# Patient Record
Sex: Male | Born: 1945 | Race: White | Hispanic: No | Marital: Married | Smoking: Former smoker
Health system: Southern US, Community
[De-identification: ages and names within clinical notes are randomized; demographics above are authoritative.]

## PROBLEM LIST (undated history)

## (undated) DIAGNOSIS — N184 Chronic kidney disease, stage 4 (severe): Secondary | ICD-10-CM

## (undated) DIAGNOSIS — I350 Nonrheumatic aortic (valve) stenosis: Secondary | ICD-10-CM

## (undated) DIAGNOSIS — I700 Atherosclerosis of aorta: Secondary | ICD-10-CM

## (undated) DIAGNOSIS — N289 Disorder of kidney and ureter, unspecified: Secondary | ICD-10-CM

## (undated) DIAGNOSIS — E039 Hypothyroidism, unspecified: Secondary | ICD-10-CM

## (undated) DIAGNOSIS — K219 Gastro-esophageal reflux disease without esophagitis: Secondary | ICD-10-CM

## (undated) DIAGNOSIS — N179 Acute kidney failure, unspecified: Secondary | ICD-10-CM

## (undated) DIAGNOSIS — Z87442 Personal history of urinary calculi: Secondary | ICD-10-CM

## (undated) DIAGNOSIS — R011 Cardiac murmur, unspecified: Secondary | ICD-10-CM

## (undated) DIAGNOSIS — I739 Peripheral vascular disease, unspecified: Secondary | ICD-10-CM

## (undated) DIAGNOSIS — N2581 Secondary hyperparathyroidism of renal origin: Secondary | ICD-10-CM

## (undated) DIAGNOSIS — E785 Hyperlipidemia, unspecified: Secondary | ICD-10-CM

## (undated) DIAGNOSIS — I10 Essential (primary) hypertension: Secondary | ICD-10-CM

## (undated) DIAGNOSIS — E119 Type 2 diabetes mellitus without complications: Secondary | ICD-10-CM

## (undated) DIAGNOSIS — I5020 Unspecified systolic (congestive) heart failure: Secondary | ICD-10-CM

## (undated) DIAGNOSIS — N186 End stage renal disease: Secondary | ICD-10-CM

## (undated) DIAGNOSIS — N400 Enlarged prostate without lower urinary tract symptoms: Secondary | ICD-10-CM

## (undated) DIAGNOSIS — M1990 Unspecified osteoarthritis, unspecified site: Secondary | ICD-10-CM

## (undated) DIAGNOSIS — Z789 Other specified health status: Secondary | ICD-10-CM

## (undated) DIAGNOSIS — G4733 Obstructive sleep apnea (adult) (pediatric): Secondary | ICD-10-CM

## (undated) DIAGNOSIS — R0600 Dyspnea, unspecified: Secondary | ICD-10-CM

## (undated) DIAGNOSIS — R519 Headache, unspecified: Secondary | ICD-10-CM

## (undated) DIAGNOSIS — Z9981 Dependence on supplemental oxygen: Secondary | ICD-10-CM

## (undated) DIAGNOSIS — I272 Pulmonary hypertension, unspecified: Secondary | ICD-10-CM

## (undated) DIAGNOSIS — Z9841 Cataract extraction status, right eye: Secondary | ICD-10-CM

## (undated) DIAGNOSIS — G43909 Migraine, unspecified, not intractable, without status migrainosus: Secondary | ICD-10-CM

## (undated) DIAGNOSIS — R0609 Other forms of dyspnea: Secondary | ICD-10-CM

## (undated) DIAGNOSIS — Z0282 Encounter for adoption services: Secondary | ICD-10-CM

## (undated) DIAGNOSIS — K5790 Diverticulosis of intestine, part unspecified, without perforation or abscess without bleeding: Secondary | ICD-10-CM

## (undated) DIAGNOSIS — E875 Hyperkalemia: Secondary | ICD-10-CM

## (undated) DIAGNOSIS — I5189 Other ill-defined heart diseases: Secondary | ICD-10-CM

## (undated) DIAGNOSIS — Z8719 Personal history of other diseases of the digestive system: Secondary | ICD-10-CM

## (undated) DIAGNOSIS — Z9842 Cataract extraction status, left eye: Secondary | ICD-10-CM

## (undated) DIAGNOSIS — G4730 Sleep apnea, unspecified: Secondary | ICD-10-CM

## (undated) DIAGNOSIS — J9611 Chronic respiratory failure with hypoxia: Secondary | ICD-10-CM

## (undated) DIAGNOSIS — D509 Iron deficiency anemia, unspecified: Secondary | ICD-10-CM

## (undated) DIAGNOSIS — M109 Gout, unspecified: Secondary | ICD-10-CM

## (undated) DIAGNOSIS — I2510 Atherosclerotic heart disease of native coronary artery without angina pectoris: Secondary | ICD-10-CM

## (undated) DIAGNOSIS — I509 Heart failure, unspecified: Secondary | ICD-10-CM

## (undated) DIAGNOSIS — J449 Chronic obstructive pulmonary disease, unspecified: Secondary | ICD-10-CM

## (undated) DIAGNOSIS — I517 Cardiomegaly: Secondary | ICD-10-CM

## (undated) DIAGNOSIS — N200 Calculus of kidney: Secondary | ICD-10-CM

## (undated) HISTORY — DX: Acute kidney failure, unspecified: N17.9

## (undated) HISTORY — PX: SUPRAVALVULAR AORTIC STENOSIS REPAIR: SHX2475

## (undated) HISTORY — PX: COLONOSCOPY: SHX174

## (undated) HISTORY — PX: HERNIA REPAIR: SHX51

## (undated) HISTORY — PX: LITHOTRIPSY: SUR834

---

## 2004-07-13 ENCOUNTER — Emergency Department: Payer: Self-pay | Admitting: Surgery

## 2005-03-20 ENCOUNTER — Ambulatory Visit: Payer: Self-pay

## 2007-01-21 ENCOUNTER — Ambulatory Visit: Payer: Self-pay | Admitting: Internal Medicine

## 2009-01-12 ENCOUNTER — Ambulatory Visit: Payer: Self-pay | Admitting: Internal Medicine

## 2009-01-12 ENCOUNTER — Ambulatory Visit: Payer: Self-pay | Admitting: Urology

## 2009-01-14 ENCOUNTER — Ambulatory Visit: Payer: Self-pay | Admitting: Urology

## 2009-03-11 ENCOUNTER — Ambulatory Visit: Payer: Self-pay | Admitting: Urology

## 2009-05-05 ENCOUNTER — Ambulatory Visit: Payer: Self-pay | Admitting: Urology

## 2009-05-06 ENCOUNTER — Ambulatory Visit: Payer: Self-pay | Admitting: Urology

## 2010-01-04 DIAGNOSIS — C4491 Basal cell carcinoma of skin, unspecified: Secondary | ICD-10-CM

## 2010-01-04 HISTORY — DX: Basal cell carcinoma of skin, unspecified: C44.91

## 2011-05-31 ENCOUNTER — Ambulatory Visit: Payer: Self-pay | Admitting: Urology

## 2011-09-12 ENCOUNTER — Ambulatory Visit: Payer: Self-pay | Admitting: Oncology

## 2011-09-15 ENCOUNTER — Ambulatory Visit: Payer: Self-pay | Admitting: Oncology

## 2011-09-15 LAB — IRON AND TIBC
Iron Saturation: 47 %
Iron: 170 ug/dL (ref 65–175)

## 2011-09-15 LAB — CBC CANCER CENTER
Basophil #: 0 x10 3/mm (ref 0.0–0.1)
Eosinophil #: 0 x10 3/mm (ref 0.0–0.7)
Eosinophil %: 0.5 %
HCT: 54.2 % — ABNORMAL HIGH (ref 40.0–52.0)
Lymphocyte #: 1.9 x10 3/mm (ref 1.0–3.6)
Lymphocyte %: 30.6 %
MCHC: 33.3 g/dL (ref 32.0–36.0)
MCV: 98 fL (ref 80–100)
Neutrophil %: 55.3 %
Platelet: 211 x10 3/mm (ref 150–440)
RBC: 5.55 10*6/uL (ref 4.40–5.90)
RDW: 13.8 % (ref 11.5–14.5)
WBC: 6.3 x10 3/mm (ref 3.8–10.6)

## 2011-09-15 LAB — FERRITIN: Ferritin (ARMC): 222 ng/mL (ref 8–388)

## 2011-09-19 LAB — CANCER CENTER HEMATOCRIT: HCT: 52.7 % — ABNORMAL HIGH (ref 40.0–52.0)

## 2011-09-23 ENCOUNTER — Ambulatory Visit: Payer: Self-pay | Admitting: Oncology

## 2011-09-26 LAB — BASIC METABOLIC PANEL
Anion Gap: 8 (ref 7–16)
BUN: 26 mg/dL — ABNORMAL HIGH (ref 7–18)
Calcium, Total: 9.3 mg/dL (ref 8.5–10.1)
Creatinine: 1.58 mg/dL — ABNORMAL HIGH (ref 0.60–1.30)
EGFR (African American): 52 — ABNORMAL LOW
EGFR (Non-African Amer.): 45 — ABNORMAL LOW
Potassium: 4.3 mmol/L (ref 3.5–5.1)
Sodium: 139 mmol/L (ref 136–145)

## 2011-09-26 LAB — CBC CANCER CENTER
Basophil #: 0 x10 3/mm (ref 0.0–0.1)
Basophil %: 0.5 %
Eosinophil %: 0.5 %
HCT: 47.5 % (ref 40.0–52.0)
HGB: 16.1 g/dL (ref 13.0–18.0)
Lymphocyte #: 1.9 x10 3/mm (ref 1.0–3.6)
Lymphocyte %: 30.5 %
MCH: 32.9 pg (ref 26.0–34.0)
MCHC: 34 g/dL (ref 32.0–36.0)
MCV: 97 fL (ref 80–100)
Monocyte #: 0.5 x10 3/mm (ref 0.2–1.0)
Neutrophil #: 3.8 x10 3/mm (ref 1.4–6.5)
Neutrophil %: 60 %
RBC: 4.91 10*6/uL (ref 4.40–5.90)
RDW: 13.7 % (ref 11.5–14.5)

## 2011-10-23 ENCOUNTER — Ambulatory Visit: Payer: Self-pay | Admitting: Oncology

## 2011-11-07 LAB — CANCER CENTER HEMATOCRIT: HCT: 42.1 % (ref 40.0–52.0)

## 2011-11-23 ENCOUNTER — Ambulatory Visit: Payer: Self-pay | Admitting: Internal Medicine

## 2011-11-23 ENCOUNTER — Ambulatory Visit: Payer: Self-pay | Admitting: Oncology

## 2011-12-05 LAB — CBC CANCER CENTER
Basophil %: 0.2 %
Eosinophil #: 0 x10 3/mm (ref 0.0–0.7)
Eosinophil %: 0.7 %
HCT: 40.6 % (ref 40.0–52.0)
HGB: 13.9 g/dL (ref 13.0–18.0)
MCH: 33.1 pg (ref 26.0–34.0)
MCHC: 34.3 g/dL (ref 32.0–36.0)
MCV: 96 fL (ref 80–100)
Monocyte %: 8.7 %
Neutrophil %: 56.3 %

## 2011-12-24 ENCOUNTER — Ambulatory Visit: Payer: Self-pay | Admitting: Oncology

## 2011-12-24 ENCOUNTER — Ambulatory Visit: Payer: Self-pay | Admitting: Internal Medicine

## 2012-02-02 ENCOUNTER — Emergency Department: Payer: Self-pay | Admitting: Emergency Medicine

## 2012-02-03 LAB — SYNOVIAL CELL COUNT + DIFF, W/ CRYSTALS
Eosinophil: 0 %
Lymphocytes: 8 %
Neutrophils: 85 %
Nucleated Cell Count: 2023 /mm3
Other Mononuclear Cells: 7 %

## 2012-02-03 LAB — BASIC METABOLIC PANEL
Anion Gap: 10 (ref 7–16)
Calcium, Total: 8.9 mg/dL (ref 8.5–10.1)
Chloride: 106 mmol/L (ref 98–107)
Co2: 24 mmol/L (ref 21–32)
EGFR (Non-African Amer.): >60
Glucose: 126 mg/dL — ABNORMAL HIGH (ref 65–99)
Osmolality: 287 (ref 275–301)
Potassium: 4.1 mmol/L (ref 3.5–5.1)
Sodium: 140 mmol/L (ref 136–145)

## 2012-02-03 LAB — CBC WITH DIFFERENTIAL/PLATELET
Basophil #: 0.1 10*3/uL (ref 0.0–0.1)
Basophil %: 0.6 %
Eosinophil #: 0.1 10*3/uL (ref 0.0–0.7)
Eosinophil %: 1.2 %
HGB: 13.3 g/dL (ref 13.0–18.0)
Lymphocyte #: 2.2 10*3/uL (ref 1.0–3.6)
MCH: 34.6 pg — ABNORMAL HIGH (ref 26.0–34.0)
MCHC: 35.5 g/dL (ref 32.0–36.0)
MCV: 98 fL (ref 80–100)
Monocyte #: 0.8 x10 3/mm (ref 0.2–1.0)
Platelet: 251 10*3/uL (ref 150–440)
RBC: 3.85 10*6/uL — ABNORMAL LOW (ref 4.40–5.90)
WBC: 8.1 10*3/uL (ref 3.8–10.6)

## 2012-02-03 LAB — SEDIMENTATION RATE: Erythrocyte Sed Rate: 49 mm/hr — ABNORMAL HIGH (ref 0–20)

## 2012-02-07 LAB — BODY FLUID CULTURE

## 2012-02-08 LAB — CULTURE, BLOOD (SINGLE)

## 2012-02-27 ENCOUNTER — Ambulatory Visit: Payer: Self-pay | Admitting: Internal Medicine

## 2012-03-24 ENCOUNTER — Ambulatory Visit: Payer: Self-pay | Admitting: Internal Medicine

## 2012-04-08 DIAGNOSIS — Z85828 Personal history of other malignant neoplasm of skin: Secondary | ICD-10-CM

## 2012-04-08 HISTORY — DX: Personal history of other malignant neoplasm of skin: Z85.828

## 2012-07-23 ENCOUNTER — Ambulatory Visit: Payer: Self-pay | Admitting: Internal Medicine

## 2012-07-25 ENCOUNTER — Inpatient Hospital Stay: Payer: Self-pay | Admitting: Internal Medicine

## 2012-07-25 LAB — CBC
HGB: 12.9 g/dL — ABNORMAL LOW (ref 13.0–18.0)
MCH: 33.6 pg (ref 26.0–34.0)
MCHC: 34.6 g/dL (ref 32.0–36.0)
Platelet: 214 10*3/uL (ref 150–440)

## 2012-07-25 LAB — COMPREHENSIVE METABOLIC PANEL
Anion Gap: 9 (ref 7–16)
Bilirubin,Total: 0.5 mg/dL (ref 0.2–1.0)
Calcium, Total: 9.2 mg/dL (ref 8.5–10.1)
Chloride: 105 mmol/L (ref 98–107)
Co2: 22 mmol/L (ref 21–32)
Creatinine: 1.64 mg/dL — ABNORMAL HIGH (ref 0.60–1.30)
EGFR (African American): 49 — ABNORMAL LOW
EGFR (Non-African Amer.): 42 — ABNORMAL LOW
Osmolality: 277 (ref 275–301)
SGPT (ALT): 19 U/L (ref 12–78)
Sodium: 136 mmol/L (ref 136–145)
Total Protein: 8.5 g/dL — ABNORMAL HIGH (ref 6.4–8.2)

## 2012-07-25 LAB — URINALYSIS, COMPLETE
Bilirubin,UR: NEGATIVE
Nitrite: POSITIVE
RBC,UR: 40 /HPF (ref 0–5)
Specific Gravity: 1.016 (ref 1.003–1.030)
WBC UR: 130 /HPF (ref 0–5)

## 2012-07-25 LAB — TROPONIN I: Troponin-I: <0.02 ng/mL

## 2012-07-25 LAB — CK TOTAL AND CKMB (NOT AT ARMC)
CK, Total: 101 U/L (ref 35–232)
CK-MB: <0.5 ng/mL — ABNORMAL LOW (ref 0.5–3.6)

## 2012-07-26 LAB — LIPID PANEL
HDL Cholesterol: 35 mg/dL — ABNORMAL LOW (ref 40–60)
Triglycerides: 157 mg/dL (ref 0–200)

## 2012-07-26 LAB — CBC WITH DIFFERENTIAL/PLATELET
Basophil #: 0 10*3/uL (ref 0.0–0.1)
Basophil %: 0.3 %
Lymphocyte #: 1.3 10*3/uL (ref 1.0–3.6)
Lymphocyte %: 10.9 %
MCHC: 34.5 g/dL (ref 32.0–36.0)
MCV: 97 fL (ref 80–100)
Monocyte %: 9.9 %
Platelet: 185 10*3/uL (ref 150–440)
RBC: 3.51 10*6/uL — ABNORMAL LOW (ref 4.40–5.90)
RDW: 13.2 % (ref 11.5–14.5)

## 2012-07-26 LAB — BASIC METABOLIC PANEL
Anion Gap: 14 (ref 7–16)
BUN: 25 mg/dL — ABNORMAL HIGH (ref 7–18)
Chloride: 107 mmol/L (ref 98–107)
Co2: 19 mmol/L — ABNORMAL LOW (ref 21–32)
Creatinine: 1.69 mg/dL — ABNORMAL HIGH (ref 0.60–1.30)
EGFR (African American): 47 — ABNORMAL LOW
EGFR (Non-African Amer.): 41 — ABNORMAL LOW
Potassium: 3.7 mmol/L (ref 3.5–5.1)
Sodium: 140 mmol/L (ref 136–145)

## 2012-07-26 LAB — MAGNESIUM: Magnesium: 1.6 mg/dL — ABNORMAL LOW

## 2012-07-26 LAB — HEMOGLOBIN A1C: Hemoglobin A1C: 6.2 % (ref 4.2–6.3)

## 2012-07-27 LAB — BASIC METABOLIC PANEL
Anion Gap: 4 — ABNORMAL LOW (ref 7–16)
BUN: 18 mg/dL (ref 7–18)
Calcium, Total: 8.5 mg/dL (ref 8.5–10.1)
Chloride: 108 mmol/L — ABNORMAL HIGH (ref 98–107)
Co2: 27 mmol/L (ref 21–32)
Creatinine: 1.26 mg/dL (ref 0.60–1.30)
EGFR (Non-African Amer.): 58 — ABNORMAL LOW
Glucose: 176 mg/dL — ABNORMAL HIGH (ref 65–99)

## 2012-07-27 LAB — CBC WITH DIFFERENTIAL/PLATELET
Basophil %: 0.1 %
Eosinophil #: 0.1 10*3/uL (ref 0.0–0.7)
Eosinophil %: 0.8 %
Lymphocyte #: 1.1 10*3/uL (ref 1.0–3.6)
Lymphocyte %: 15 %
MCH: 33.7 pg (ref 26.0–34.0)
MCHC: 34.5 g/dL (ref 32.0–36.0)
MCV: 98 fL (ref 80–100)
Monocyte #: 0.5 x10 3/mm (ref 0.2–1.0)
Neutrophil %: 77.1 %
Platelet: 187 10*3/uL (ref 150–440)
RDW: 13 % (ref 11.5–14.5)

## 2012-07-27 LAB — CREATININE, URINE, RANDOM: Creatinine, Urine Random: 58.9 mg/dL (ref 30.0–125.0)

## 2012-07-28 LAB — URINE CULTURE

## 2012-07-31 LAB — CULTURE, BLOOD (SINGLE)

## 2012-08-13 LAB — HEMATOCRIT: HCT: 37.1 % — ABNORMAL LOW (ref 40.0–52.0)

## 2012-08-22 ENCOUNTER — Ambulatory Visit: Payer: Self-pay | Admitting: Internal Medicine

## 2012-11-22 ENCOUNTER — Ambulatory Visit: Payer: Self-pay | Admitting: Internal Medicine

## 2012-11-26 LAB — CBC CANCER CENTER
Basophil %: 0.2 %
Eosinophil %: 0.6 %
HGB: 13.5 g/dL (ref 13.0–18.0)
Lymphocyte #: 1.8 x10 3/mm (ref 1.0–3.6)
Lymphocyte %: 31.3 %
MCHC: 36.2 g/dL — ABNORMAL HIGH (ref 32.0–36.0)
MCV: 95 fL (ref 80–100)
Monocyte #: 0.6 x10 3/mm (ref 0.2–1.0)
RDW: 13.2 % (ref 11.5–14.5)
WBC: 5.8 x10 3/mm (ref 3.8–10.6)

## 2012-12-23 ENCOUNTER — Ambulatory Visit: Payer: Self-pay | Admitting: Internal Medicine

## 2012-12-26 ENCOUNTER — Ambulatory Visit: Payer: Self-pay | Admitting: Cardiovascular Disease

## 2012-12-26 DIAGNOSIS — I2510 Atherosclerotic heart disease of native coronary artery without angina pectoris: Secondary | ICD-10-CM

## 2012-12-26 HISTORY — DX: Atherosclerotic heart disease of native coronary artery without angina pectoris: I25.10

## 2012-12-26 HISTORY — PX: CARDIAC CATHETERIZATION: SHX172

## 2012-12-26 HISTORY — PX: TRANSESOPHAGEAL ECHOCARDIOGRAM: SHX273

## 2012-12-26 HISTORY — PX: OTHER SURGICAL HISTORY: SHX169

## 2012-12-27 DIAGNOSIS — N200 Calculus of kidney: Secondary | ICD-10-CM | POA: Insufficient documentation

## 2012-12-27 DIAGNOSIS — IMO0002 Reserved for concepts with insufficient information to code with codable children: Secondary | ICD-10-CM | POA: Insufficient documentation

## 2012-12-27 DIAGNOSIS — I2510 Atherosclerotic heart disease of native coronary artery without angina pectoris: Secondary | ICD-10-CM | POA: Insufficient documentation

## 2012-12-27 DIAGNOSIS — E785 Hyperlipidemia, unspecified: Secondary | ICD-10-CM | POA: Insufficient documentation

## 2012-12-27 HISTORY — DX: Calculus of kidney: N20.0

## 2012-12-27 HISTORY — DX: Reserved for concepts with insufficient information to code with codable children: IMO0002

## 2013-01-27 ENCOUNTER — Encounter: Payer: Self-pay | Admitting: Cardiovascular Disease

## 2013-02-22 ENCOUNTER — Encounter: Payer: Self-pay | Admitting: Cardiovascular Disease

## 2013-03-24 ENCOUNTER — Encounter: Payer: Self-pay | Admitting: Cardiovascular Disease

## 2013-09-18 ENCOUNTER — Ambulatory Visit: Payer: Self-pay | Admitting: Gastroenterology

## 2013-09-18 LAB — HM COLONOSCOPY

## 2013-09-19 LAB — PATHOLOGY REPORT

## 2014-02-13 HISTORY — PX: UMBILICAL HERNIA REPAIR: SHX196

## 2014-07-27 ENCOUNTER — Other Ambulatory Visit
Admit: 2014-07-27 | Disposition: A | Discharge status: Home / Self Care | Payer: Self-pay | Attending: Nephrology | Admitting: Nephrology

## 2014-07-27 LAB — PROTEIN / CREATININE RATIO, URINE
Creatinine, Urine Random: 85 mg/dL (ref 30–125)
Protein, Urine: 394 mg/dL (ref 0–9)
Protein/Creat. Ratio: >3000 mg/gCREAT (ref 0–200)

## 2014-07-27 LAB — URINALYSIS, COMPLETE
BACTERIA: NONE SEEN
BLOOD: NEGATIVE
Bilirubin,UR: NEGATIVE
Glucose,UR: 50 mg/dL (ref 0–75)
Hyaline Cast: 2
KETONE: NEGATIVE
Leukocyte Esterase: NEGATIVE
Nitrite: NEGATIVE
Ph: 6 (ref 4.5–8.0)
Protein: >=500
Specific Gravity: 1.016 (ref 1.003–1.030)
WBC UR: <1 /HPF (ref 0–5)

## 2014-07-27 LAB — CBC WITH DIFFERENTIAL/PLATELET
BASOS PCT: 0.3 %
Basophil #: 0 10*3/uL (ref 0.0–0.1)
EOS PCT: 0.3 %
Eosinophil #: 0 10*3/uL (ref 0.0–0.7)
HCT: 41.2 % (ref 40.0–52.0)
HGB: 14.1 g/dL (ref 13.0–18.0)
LYMPHS ABS: 1.8 10*3/uL (ref 1.0–3.6)
Lymphocyte %: 23.1 %
MCH: 33 pg (ref 26.0–34.0)
MCHC: 34.3 g/dL (ref 32.0–36.0)
MCV: 96 fL (ref 80–100)
Monocyte #: 0.6 x10 3/mm (ref 0.2–1.0)
Monocyte %: 7.8 %
NEUTROS PCT: 68.5 %
Neutrophil #: 5.4 10*3/uL (ref 1.4–6.5)
Platelet: 276 10*3/uL (ref 150–440)
RBC: 4.29 10*6/uL — ABNORMAL LOW (ref 4.40–5.90)
RDW: 13.1 % (ref 11.5–14.5)
WBC: 7.9 10*3/uL (ref 3.8–10.6)

## 2014-07-27 LAB — COMPREHENSIVE METABOLIC PANEL
ALBUMIN: 3.6 g/dL
AST: 21 U/L
Alkaline Phosphatase: 69 U/L
Anion Gap: 7 (ref 7–16)
BILIRUBIN TOTAL: 0.7 mg/dL
BUN: 28 mg/dL — AB
Calcium, Total: 8.7 mg/dL — ABNORMAL LOW
Chloride: 105 mmol/L
Co2: 26 mmol/L
Creatinine: 1.64 mg/dL — ABNORMAL HIGH
EGFR (Non-African Amer.): 42 — ABNORMAL LOW
GFR CALC AF AMER: 48 — AB
GLUCOSE: 135 mg/dL — AB
Potassium: 4.2 mmol/L
SGPT (ALT): 17 U/L
SODIUM: 138 mmol/L
Total Protein: 6.9 g/dL

## 2014-07-27 LAB — PROTIME-INR
INR: 1
Prothrombin Time: 13.2 secs

## 2014-07-27 LAB — APTT: ACTIVATED PTT: 30 s (ref 23.6–35.9)

## 2014-07-28 ENCOUNTER — Ambulatory Visit
Admit: 2014-07-28 | Disposition: A | Discharge status: Home / Self Care | Payer: Self-pay | Attending: Nephrology | Admitting: Nephrology

## 2014-08-14 NOTE — Discharge Summary (Signed)
PATIENT NAME:  [name] [name], [name] [name] MR#:  [id number] DATE OF BIRTH:  1945-12-07  DATE OF ADMISSION:  07/25/2012 DATE OF DISCHARGE:  07/27/2012  NO DICTATION ____________________________ Alfredia Ferguson A. Elijio Miles, MD sat:cc D: 07/28/2012 14:20:01 ET T: 07/28/2012 20:15:41 ET JOB#: [id number]  cc: Sheikh A. Elijio Miles, MD, <Dictator> Veverly Fells MD ELECTRONICALLY SIGNED 08/06/2012 13:33

## 2014-08-14 NOTE — Discharge Summary (Signed)
PATIENT NAME:  [name], [name] MR#:  [id number] DATE OF BIRTH:  Jun 05, 1945  DATE OF ADMISSION:  07/25/2012 DATE OF DISCHARGE:  07/27/2012  DISCHARGE DIAGNOSES:   1.  Urinary tract infection.  2.  Sepsis syndrome.  3.  Acute renal failure.  4.  Nephrolithiasis   PROCEDURE:  Renal ultrasound.   DISCHARGE MEDICATIONS:   1.  Ciprofloxacin 500 mg b.i.d. for at least 5 more days.  2.  Hydrochlorothiazide/lisinopril changed to 20/12.5 mg.  3.  The patient is to resume other home medications except for over-the-counter potassium. Please refer to discharge medical reconciliation for discharge medication list.   HOSPITAL COURSE:  This gentleman was admitted through the Emergency Room complaining of fever, chills and dysuria. He also reported some blood in his urine. He has had to seek medical attention in the Emergency Room where clinical features and lab workup were consistent with a diagnosis of urinary tract infection and sepsis syndrome. Please see history and physical for details. He received intravenous fluids, a bolus, was placed on IV ceftriaxone and was admitted to a regular medical floor. He was noticed to be in acute renal failure. On the following day after I took over, [name] creatinine was still somewhat elevated. I increased his rate of fluids and continued his other medication. On the second day of admission, [name] renal function normalized. White cell count and other markers of sepsis had normalized with normalization of his heart rate. The patient is discharged to home in stable condition to continue and complete his course of oral antibiotics as an outpatient. Of note, his renal ultrasound did reveal a nephrolithiasis. The patient was fully ambulatory throughout his stay.   DISCHARGE INSTRUCTIONS:   DIET:  Low sodium.  ACTIVITY:  As tolerated.  FOLLOWUP:  In 1 to 2 weeks with Dr. Lamonte Sakai.   DISCHARGE PROCESS TIME SPENT:  32  minutes.  ____________________________ [name], MD sat:si D: 07/28/2012 14:19:00 ET T: 07/28/2012 20:30:07 ET JOB#: [id number]  cc: Sheikh A. [name] Miles, MD, <Dictator> Veverly Fells MD ELECTRONICALLY SIGNED 08/06/2012 13:33

## 2014-08-14 NOTE — H&P (Signed)
PATIENT NAME:  [name] [name], NAGER MR#:  [id number] DATE OF BIRTH:  1946/04/17  DATE OF ADMISSION:  07/25/2012  REFERRING PHYSICIAN: Dr. Owens Shark.   PRIMARY CARE PHYSICIAN: Dr. Lamonte Sakai.   CARDIOLOGIST: Dr. [name].    CHIEF COMPLAINT: Fever and dysuria.    HISTORY OF PRESENT ILLNESS: The patient is a pleasant, 69 year old, obese, Caucasian male with a history of obstructive sleep apnea on CPAP, COPD on oxygen at night, nephrolithiasis and history of CAD. The patient was at his usual baseline until about a day ago when he found himself to have acute fevers, chills and some dysuria with some blood in the urine. As symptoms persisted, he came into the hospital. He stated that he had a fever as high as 101.4 and has had decreased urinary output since then. He was doing fine prior to that. He was noted to have tachycardia and relative hypotension and has received a liter of fluids. He was also noted to have a UTI per the UA and some leukocytosis, and hospitalist services were contacted for further evaluation and management.   PAST MEDICAL HISTORY: Hypertension, COPD on oxygen at night, obstructive sleep apnea on CPAP, type 2 diabetes, hyperlipidemia, CAD, nephrolithiasis, obesity, chronic allergies and history of UTI in the distant past.   FAMILY HISTORY: He is adopted. Unknown.   ALLERGIES: No known drug allergies.   SOCIAL HISTORY: Ex-smoker, quit years ago; however, still is exposed to secondhand smoke of several family members. Occasional alcohol. Retired. No drugs.   OUTPATIENT MEDICATIONS: Advair 100/50 mcg 1 puff 2 times a day, aspirin 81 mg daily, etodolac 400 mg extended release 2 times a day, gemfibrozil 600 mg 2 times a day, hydrochlorothiazide/lisinopril 25/20 mg 1 tab once a day, magnesium OTC 500 mg daily, metformin 750 mg extended release 2 times a day, metoprolol succinate 50 mg once a day, Plavix 75 mg daily, potassium OTC 99 mg daily, simvastatin 20 mg daily, vitamin C 1000  mg daily.   SURGICAL HISTORY: Denies.   REVIEW OF SYSTEMS:  CONSTITUTIONAL: Positive for fatigue, weakness and a fever today.  EYES: No blurry vision or double vision.  ENT: No tinnitus, hearing loss or snoring. Has allergies.  RESPIRATORY: No cough, wheezing or shortness of breath. No painful respirations.  CARDIOVASCULAR: No chest pain. Has some off and on lower extremity edema and history of CAD.  GASTROINTESTINAL: No nausea or vomiting but has bloating and gas chronically.  GENITOURINARY: Has dysuria plus hematuria and history of nephrolithiasis in the past.  ENDOCRINE: No polyuria or nocturia.  HEMATOLOGY AND LYMPHATIC: Has a history of secondary erythrocytosis.  MUSCULOSKELETAL: Has chronic arthritis and has chronic bilateral rotator cuff issues bilateral upper extremities.  NEUROLOGIC: Denies numbness or weakness.  PSYCHIATRIC: Denies insomnia or anxiety.   PHYSICAL EXAMINATION:  VITALS: Temperature on arrival 99.3, pulse 116, respiratory rate 20, blood pressure 104/62, O2 sat 95% on room air. Last blood pressure 113/59.  GENERAL: The patient is a morbidly obese Caucasian male sitting in bed, in no obvious distress, talking in full sentences.  HEENT: Normocephalic, atraumatic. Pupils are equal and reactive. Anicteric sclerae. Extraocular muscles intact. Moist mucous membranes.  NECK: Supple. No thyroid tenderness. No cervical lymphadenopathy.  CARDIOVASCULAR: S1 and S2, tachycardic. No murmurs, rubs or gallops.  LUNGS: Clear to auscultation without wheezing or rhonchi. No costophrenic angle tenderness on palpation of the flanks bilaterally.  ABDOMEN: Soft, nontender and nondistended. Positive for an umbilical hernia. No organomegaly appreciated.  EXTREMITIES: Edema 1+ to right above  the ankles bilaterally.  SKIN: No obvious rashes or evidence for cellulitis.  NEUROLOGIC: Cranial nerves II through XII appear to be grossly intact. Strength is 5 out of 5 all extremities. Sensation is  intact to light touch.  PSYCHIATRIC: Awake, alert, oriented x3. Pleasant and cooperative.   LABS: UA is positive for 3+ blood, nitrites, as well as 3+ leukocyte esterase, 40 RBCs, 130 WBCs, 1+ bacteria. Hemoglobin 12.9. WBC is 14.8. Troponin negative. CK-MB and CK total not elevated. Total protein 8.5. Otherwise, LFTs within normal limits. BUN 25, creatinine 1.64, sodium 136, potassium 4. CT of abdomen and pelvis without contrast showing mild emphysema. No obstructing renal calculi. No hydronephrosis. Bladder wall is mildly prominent, concerning for cystitis. Mild prostate hypertrophy. EKG: Sinus tach, rate is 108. Some nonspecific T wave changes mostly in inferior lateral leads.    ASSESSMENT AND PLAN: A pleasant, 69 year old, obese, Caucasian male with obstructive sleep apnea on CPAP at night and nocturnal oxygen, diabetes, hypertension, coronary artery disease, nephrolithiasis and secondary erythrocytosis who presents with severe sepsis, including tachycardia, leukocytosis and subjective fever of 101.4, with new renal failure. His creatinine is way above his baseline, and the patient appears to have a positive urinalysis and is symptomatic. The patient likely has a complicated urinary tract infection requiring intravenous antibiotics and relative hypotension requiring some fluids and renal failure. Will admit the patient to the hospital and continue the ceftriaxone daily. There were no blood cultures ordered, and I will order that but now and follow up with urine cultures as well. Will follow with a CBC and trend him symptomatically. Will follow up with the cultures and adjust antibiotics accordingly. In regards to his renal failure, will hold nonsteroidal anti-inflammatory drugs as well as hydrochlorothiazide and the metformin and she how he does with intravenous fluids. There is no hydronephrosis on the CAT scan, but this is preliminary result nonetheless. We will hydrate him and holding the blood pressure  medications will probably help. Will hold the metformin again and start him on sliding scale insulin. Check a hemoglobin A1c. Would continue the statin, Plavix and aspirin in regards to the coronary artery disease and change the beta blocker to more short acting in case he does become more hypotensive. He does have some abdominal bloating and will try simethicone for relief as well as a proton pump inhibitor. Will place him on sequential compression devices and thromboembolic disease stockings (TEDs) for deep vein thrombosis prophylaxis as he does have some hematuria. We will monitor that and also consider starting heparin for deep vein thrombosis prophylaxis as well. However, he is pretty ambulatory. In regards to his chronic obstructive pulmonary disease, this appears to be stable without any wheezing or issues at this point.   CODE STATUS: FULL CODE.   TOTAL TIME SPENT: 60 minutes.    ____________________________ Vivien Presto, MD sa:gb D: 07/25/2012 23:15:30 ET T: 07/25/2012 23:31:15 ET JOB#: [id number]  cc: Vivien Presto, MD, <Dictator> Perrin Maltese, MD Vivien Presto MD ELECTRONICALLY SIGNED 08/17/2012 13:55

## 2014-08-17 LAB — SURGICAL PATHOLOGY

## 2014-12-16 ENCOUNTER — Emergency Department (HOSPITAL_COMMUNITY)
Admission: EM | Admit: 2014-12-16 | Discharge: 2014-12-16 | Disposition: A | Discharge status: Home / Self Care | Payer: Medicare HMO | Attending: Emergency Medicine | Admitting: Emergency Medicine

## 2014-12-16 ENCOUNTER — Encounter (HOSPITAL_COMMUNITY): Payer: Self-pay | Admitting: Emergency Medicine

## 2014-12-16 ENCOUNTER — Emergency Department (HOSPITAL_COMMUNITY): Payer: Medicare HMO

## 2014-12-16 DIAGNOSIS — T07XXXA Unspecified multiple injuries, initial encounter: Secondary | ICD-10-CM

## 2014-12-16 DIAGNOSIS — Z7982 Long term (current) use of aspirin: Secondary | ICD-10-CM | POA: Diagnosis not present

## 2014-12-16 DIAGNOSIS — I10 Essential (primary) hypertension: Secondary | ICD-10-CM | POA: Insufficient documentation

## 2014-12-16 DIAGNOSIS — E119 Type 2 diabetes mellitus without complications: Secondary | ICD-10-CM | POA: Insufficient documentation

## 2014-12-16 DIAGNOSIS — Z7951 Long term (current) use of inhaled steroids: Secondary | ICD-10-CM | POA: Insufficient documentation

## 2014-12-16 DIAGNOSIS — Y92008 Other place in unspecified non-institutional (private) residence as the place of occurrence of the external cause: Secondary | ICD-10-CM | POA: Diagnosis not present

## 2014-12-16 DIAGNOSIS — S0081XA Abrasion of other part of head, initial encounter: Secondary | ICD-10-CM | POA: Insufficient documentation

## 2014-12-16 DIAGNOSIS — Z87448 Personal history of other diseases of urinary system: Secondary | ICD-10-CM | POA: Diagnosis not present

## 2014-12-16 DIAGNOSIS — Y9389 Activity, other specified: Secondary | ICD-10-CM | POA: Insufficient documentation

## 2014-12-16 DIAGNOSIS — S81851A Open bite, right lower leg, initial encounter: Secondary | ICD-10-CM

## 2014-12-16 DIAGNOSIS — S0990XA Unspecified injury of head, initial encounter: Secondary | ICD-10-CM | POA: Insufficient documentation

## 2014-12-16 DIAGNOSIS — W540XXA Bitten by dog, initial encounter: Secondary | ICD-10-CM | POA: Insufficient documentation

## 2014-12-16 DIAGNOSIS — IMO0002 Reserved for concepts with insufficient information to code with codable children: Secondary | ICD-10-CM

## 2014-12-16 DIAGNOSIS — S0011XA Contusion of right eyelid and periocular area, initial encounter: Secondary | ICD-10-CM | POA: Diagnosis not present

## 2014-12-16 DIAGNOSIS — Z87442 Personal history of urinary calculi: Secondary | ICD-10-CM | POA: Diagnosis not present

## 2014-12-16 DIAGNOSIS — Z79899 Other long term (current) drug therapy: Secondary | ICD-10-CM | POA: Insufficient documentation

## 2014-12-16 DIAGNOSIS — Y998 Other external cause status: Secondary | ICD-10-CM | POA: Insufficient documentation

## 2014-12-16 DIAGNOSIS — H05231 Hemorrhage of right orbit: Secondary | ICD-10-CM

## 2014-12-16 HISTORY — DX: Essential (primary) hypertension: I10

## 2014-12-16 HISTORY — DX: Calculus of kidney: N20.0

## 2014-12-16 HISTORY — DX: Type 2 diabetes mellitus without complications: E11.9

## 2014-12-16 HISTORY — DX: Disorder of kidney and ureter, unspecified: N28.9

## 2014-12-16 LAB — CBG MONITORING, ED: Glucose-Capillary: 90 mg/dL (ref 65–99)

## 2014-12-16 MED ORDER — CEFAZOLIN SODIUM 1-5 GM-% IV SOLN
1.0000 g | Freq: Once | INTRAVENOUS | Status: AC
  Administered 2014-12-16: 1 g via INTRAVENOUS
  Filled 2014-12-16: qty 50

## 2014-12-16 MED ORDER — LIDOCAINE HCL 1 % IJ SOLN
20.0000 mL | Freq: Once | INTRAMUSCULAR | Status: AC
  Administered 2014-12-16: 20 mL
  Filled 2014-12-16: qty 20

## 2014-12-16 NOTE — ED Notes (Signed)
Bed: WA08 Expected date:  Expected time:  Means of arrival:  Comments: Ems- elderly fall dog bite

## 2014-12-16 NOTE — Progress Notes (Signed)
Pt with good family support at bedside.  Pt bitten by someone else's dog  CM updated pt pcp in EPIC

## 2014-12-16 NOTE — ED Notes (Signed)
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  Pt has abrasions and bruising to right side face where pt fell into gravel when. Dog owner is in [facility] with her husband at hospital, so unknown if pt's shots are up to date.

## 2014-12-16 NOTE — ED Notes (Signed)
Discharge instructions explained, vitals taken, IV removed all during system downtime. Charted on paper charting.

## 2014-12-17 ENCOUNTER — Encounter: Payer: Medicare HMO | Attending: Surgery | Admitting: Surgery

## 2014-12-17 DIAGNOSIS — E785 Hyperlipidemia, unspecified: Secondary | ICD-10-CM | POA: Diagnosis not present

## 2014-12-17 DIAGNOSIS — I209 Angina pectoris, unspecified: Secondary | ICD-10-CM | POA: Diagnosis not present

## 2014-12-17 DIAGNOSIS — Z992 Dependence on renal dialysis: Secondary | ICD-10-CM | POA: Diagnosis not present

## 2014-12-17 DIAGNOSIS — I120 Hypertensive chronic kidney disease with stage 5 chronic kidney disease or end stage renal disease: Secondary | ICD-10-CM | POA: Insufficient documentation

## 2014-12-17 DIAGNOSIS — J449 Chronic obstructive pulmonary disease, unspecified: Secondary | ICD-10-CM | POA: Insufficient documentation

## 2014-12-17 DIAGNOSIS — I2510 Atherosclerotic heart disease of native coronary artery without angina pectoris: Secondary | ICD-10-CM | POA: Diagnosis not present

## 2014-12-17 DIAGNOSIS — Z87891 Personal history of nicotine dependence: Secondary | ICD-10-CM | POA: Insufficient documentation

## 2014-12-17 DIAGNOSIS — W540XXA Bitten by dog, initial encounter: Secondary | ICD-10-CM | POA: Diagnosis not present

## 2014-12-17 DIAGNOSIS — E669 Obesity, unspecified: Secondary | ICD-10-CM | POA: Insufficient documentation

## 2014-12-17 DIAGNOSIS — S81811A Laceration without foreign body, right lower leg, initial encounter: Secondary | ICD-10-CM | POA: Diagnosis not present

## 2014-12-17 DIAGNOSIS — L97212 Non-pressure chronic ulcer of right calf with fat layer exposed: Secondary | ICD-10-CM | POA: Diagnosis present

## 2014-12-17 DIAGNOSIS — E11621 Type 2 diabetes mellitus with foot ulcer: Secondary | ICD-10-CM | POA: Insufficient documentation

## 2014-12-17 DIAGNOSIS — N189 Chronic kidney disease, unspecified: Secondary | ICD-10-CM | POA: Insufficient documentation

## 2014-12-17 DIAGNOSIS — G4733 Obstructive sleep apnea (adult) (pediatric): Secondary | ICD-10-CM | POA: Diagnosis not present

## 2014-12-17 NOTE — ED Provider Notes (Signed)
CSN: [id number]     Arrival date & time 12/16/14  1119 History   First MD Initiated Contact with Patient 12/16/14 1232     Chief Complaint  Patient presents with  . Animal Bite  . Abrasion     (Consider location/radiation/quality/duration/timing/severity/associated sxs/prior Treatment) HPI Patient does maintenance work. He approached the house and the storm door was not closed securely. A large dog came out of the house and attacked him. It bit his right leg and caused him to turn and fall. The patient reports he was on a porch that was about 1 foot off the ground. He reports that he scraped his face and got a black eye from his glasses hitting his face when he fell. He denies that he was knocked out. He reports he is sore on the side of his face and his head where he fell. He denies any associated neck pain. He reports he was walking after the incident occurred. He does not have weakness and numbness to the extremities. He does have a large and deep laceration to the right lower leg. He reports there is a burning quality of pain associated. The dog has been taken into quarentine. Past Medical History  Diagnosis Date  . Diabetes mellitus without complication   . Hypertension   . Renal disorder   . Kidney stones    Past Surgical History  Procedure Laterality Date  . Hernia repair    . Lithotripsy     No family history on file. Social History  Substance Use Topics  . Smoking status: Never Smoker   . Smokeless tobacco: None  . Alcohol Use: No    Review of Systems 10 Systems reviewed and are negative for acute change except as noted in the HPI.    Allergies  Review of patient's allergies indicates no known allergies.  Home Medications   Prior to Admission medications   Medication Sig Start Date End Date Taking? Authorizing Provider  Ascorbic Acid (VITAMIN C) 1000 MG tablet Take 1,000 mg by mouth daily.   Yes Historical Provider, MD  aspirin 81 MG chewable tablet Chew 81 mg  by mouth daily.   Yes Historical Provider, MD  Cholecalciferol (VITAMIN D-1000 MAX ST) 1000 UNITS tablet Take 1,000 Units by mouth daily.   Yes Historical Provider, MD  Chromium Picolinate 200 MCG CAPS Take 200 mcg by mouth daily.   Yes Historical Provider, MD  citalopram (CELEXA) 20 MG tablet Take 20 mg by mouth daily. 07/26/13  Yes Historical Provider, MD  fenofibrate 160 MG tablet Take 160 mg by mouth daily. 09/14/14  Yes Historical Provider, MD  fexofenadine (ALLEGRA ALLERGY) 180 MG tablet Take 180 mg by mouth daily.   Yes Historical Provider, MD  fluticasone (FLONASE) 50 MCG/ACT nasal spray Place 2 sprays into both nostrils daily as needed for allergies.  10/23/14  Yes Historical Provider, MD  fluticasone-salmeterol (ADVAIR HFA) 115-21 MCG/ACT inhaler Inhale 1 puff into the lungs daily as needed (shortness of breath, wheezing).    Yes Historical Provider, MD  furosemide (LASIX) 20 MG tablet Take 20 mg by mouth daily. 08/19/14  Yes Historical Provider, MD  gabapentin (NEURONTIN) 100 MG capsule Take 100-200 mg by mouth 2 (two) times daily. Take 100 mg every morning and 200 mg every night 12/01/14  Yes Historical Provider, MD  Glucosamine-Chondroit-Vit C-Mn (GLUCOSAMINE CHONDROITIN COMPLX) CAPS Take 1 tablet by mouth 2 (two) times daily.   Yes Historical Provider, MD  losartan (COZAAR) 25 MG tablet Take 25 mg  by mouth daily. 12/08/14  Yes Historical Provider, MD  Magnesium 500 MG CAPS Take 500 mg by mouth daily.   Yes Historical Provider, MD  methocarbamol (ROBAXIN) 750 MG tablet Take 750 mg by mouth daily as needed for muscle spasms.  08/12/13  Yes Historical Provider, MD  metoprolol succinate (TOPROL-XL) 50 MG 24 hr tablet Take 50 mg by mouth daily. 11/03/14  Yes Historical Provider, MD  niacin 500 MG tablet Take 500 mg by mouth daily.   Yes Historical Provider, MD  pantoprazole (PROTONIX) 40 MG tablet Take 40 mg by mouth daily.   Yes Historical Provider, MD  pravastatin (PRAVACHOL) 20 MG tablet Take 20 mg  by mouth daily. 12/07/14  Yes Historical Provider, MD  VENTOLIN HFA 108 (90 BASE) MCG/ACT inhaler Inhale 2 puffs into the lungs every 6 (six) hours as needed for wheezing or shortness of breath.  11/04/14  Yes Historical Provider, MD   BP 123/57 mmHg  Pulse 85  Temp(Src) 97.6 F (36.4 C) (Oral)  Resp 17  SpO2 92% Physical Exam  Constitutional: He is oriented to person, place, and time. He appears well-developed and well-nourished.  Patient is alert and GCS of 15. He is not have acute respiratory distress.  HENT:  Nose: Nose normal.  Mouth/Throat: Oropharynx is clear and moist.  Patient has abrasions to the entirety of the right side of his face. There is a periorbital hematoma formed. Small puncture type laceration in the right brow. see image.  Eyes: EOM are normal. Pupils are equal, round, and reactive to light.  Neck: Neck supple.  No C-spine tenderness.  Cardiovascular: Normal rate, regular rhythm, normal heart sounds and intact distal pulses.   Pulmonary/Chest: Effort normal and breath sounds normal. No respiratory distress.  Abdominal: Soft. Bowel sounds are normal. He exhibits no distension. There is no tenderness.  Musculoskeletal: Normal range of motion.  Both upper extremities do not have deformity. Patient has normal use. Right lower extremity has complex bite wound. See images below. The posterior laceration has associated partial muscle body laceration. There is disruption of the fascia. As well the lower anterior laceration has a linear opening in the fascia. Good distal neurovascular function of the foot.  Neurological: He is alert and oriented to person, place, and time. He has normal strength. Coordination normal. GCS eye subscore is 4. GCS verbal subscore is 5. GCS motor subscore is 6.  Skin: Skin is warm, dry and intact.  Psychiatric: He has a normal mood and affect.                  ED Course  Procedures (including critical care time) LACERATION  REPAIR Performed by: Charlesetta Shanks Authorized by: Charlesetta Shanks Consent: Verbal consent obtained. Risks and benefits: risks, benefits and alternatives were discussed Consent given by: patient Patient identity confirmed: provided demographic data Prepped and Draped in normal sterile fashion Wound explored  Laceration Location: Right lower leg  Laceration Length: Anterior tibial wound approximately 6 cm in length by 5 cm flap.  No Foreign Bodies seen or palpated  Anesthesia: local infiltration  Local anesthetic: lidocaine 1%   Anesthetic total: 3 ml  Irrigation method: syringe Amount of cleaning: standard  Skin closure: 2 horizontal mattress sutures placed 3 simple interrupted sutures placed   Number of sutures: See above   Technique: Extensive irrigation and cleaning.   Patient tolerance: Patient tolerated the procedure well with no immediate complications. Labs Review  Posterior leg laceration was extensively irrigated and cleaned. One interrupted  suture placed.  Lower anterior leg laceration extensively irrigated and 2 interrupted sutures placed.  All wounds were closed loosely to leave adequate drainage potential. Labs Reviewed  CBG MONITORING, ED    Imaging Review Dg Tibia/fibula Right  12/16/2014   CLINICAL DATA:  Dog bite  EXAM: RIGHT TIBIA AND FIBULA - 2 VIEW  COMPARISON:  None.  FINDINGS: No acute fracture. No dislocation. No destructive bone lesion. Mild degenerative changes at the ankle and knee.  IMPRESSION: No acute bony pathology.   Electronically Signed   By: Marybelle Killings M.D.   On: 12/16/2014 13:46   Ct Head Wo Contrast  12/16/2014   CLINICAL DATA:  Fall injury to face.  Fall following dog bite.  EXAM: CT HEAD WITHOUT CONTRAST  TECHNIQUE: Contiguous axial images were obtained from the base of the skull through the vertex without intravenous contrast.  COMPARISON:  None.  FINDINGS: No acute intracranial hemorrhage. No focal mass lesion. No CT evidence  of acute infarction. No midline shift or mass effect. No hydrocephalus. Basilar cisterns are patent.  There is preseptal swelling over the RIGHT orbit. Intraconal contents are clear. Globes are normal. No evidence of skull fracture.  Small amount of fluid in the RIGHT maxillary sinus. Small of fluid in the sphenoid sinuses. Frontal sinuses are clear. Mastoid air cells are clear.  IMPRESSION: 1. No intracranial trauma. 2. Preseptal swelling over the RIGHT orbit. The intraconal contents appear normal. 3. Small amount of fluid in the RIGHT maxillary sinus. Differential includes sinus inflammation versus small amount of hemorrhage. Consider CT of the face if concern for a facial fracture inferior to the orbits. 4. Sinus inflammation in the sphenoid sinuses.   Electronically Signed   By: Suzy Bouchard M.D.   On: 12/16/2014 13:59   I have personally reviewed and evaluated these images and lab results as part of my medical decision-making.   EKG Interpretation None      MDM   Final diagnoses:  Dog bite of calf, right, initial encounter  Laceration  Head injury, initial encounter  Periorbital hematoma, right  Multiple abrasions   Patient had a complex bite to the lower leg from a large dog. X-ray does not show any abnormality. These were extensively cleaned and closed loosely to allow drainage and swelling. Patient is counseled on close follow-up and recheck of the wounds. He is aware of the potential for infection despite cleaning and antibiotics use. Patient also sustained a facial injury from fall. CT does not show any intracranial bleed. Patient does not have any associated neurologic symptoms or significant headache. He'll be treated for pain with Vicodin and instructed for return concerning head injury as well as complex dog bite laceration. Patient's tetanus is up to date from prior laceration repair within the past 5 years. The dog in question is under quarentine and is not suspected of having  rabies. Patient was given a dose of Ancef in the emergency department and started on Augmentin.    Charlesetta Shanks, MD 12/18/14 0005

## 2014-12-18 NOTE — Progress Notes (Signed)
[name] [name], [name] [name] ([id number]) Visit Report for 12/17/2014 Chief Complaint Document Details [name] [name], [name] [name] 12/17/2014 10:15 Patient Name: Date of Service: P. AM Medical Record Patient Account Number: [id number] Number: Treating RN: Montey Hora Date of Birth/Sex: 16-Sep-1945 (68 y.o. Male) Other Clinician: [place], [place], [place] Physician: Physician/Extender: Referring Physician: Orland Mustard in Treatment: 0 Information Obtained from: Patient Chief Complaint Patient presents to the wound care center for a consult due non healing wound. He sustained a dog bite yesterday and went to the ER where first aid and suturing was done. Electronic Signature(s) Signed: 12/17/2014 11:11:16 AM By: Christin Fudge MD, FACS Entered By: Christin Fudge on 12/17/2014 11:11:16 [name] [name] ([id number]) -------------------------------------------------------------------------------- HPI Details [name] [name], [name] [name] 12/17/2014 10:15 Patient Name: Date of Service: P. AM Medical Record Patient Account Number: [id number] Number: Treating RN: Montey Hora Date of Birth/Sex: 11-Jun-1945 (68 y.o. Male) Other Clinician: [place], [place], [place] Physician: Physician/Extender: Referring Physician: Orland Mustard in Treatment: 0 History of Present Illness Location: right lower extremity Quality: Patient reports experiencing a sharp pain to affected area(s). Severity: Patient states wound (s) are getting better. Duration: Patient states the wound has been present for ____1_days. Timing: Pain in wound is constant (hurts all the time) Context: The wound occurred when the patient was bitten by a dog yesterday morning Modifying Factors: Consults to this date include:was seen in the ED and given IV antibiotics and suturing was done. Associated Signs and Symptoms: Patient reports presence of swelling HPI  Description: 69 year old gentleman was seen in the ED on 12/16/2014 with a dog bite. No notes are available on Epic. Past medical history significant for essential hypertension, COPD, sleep apnea, nephrolithiasis, hyperlipidemia, obesity, diabetes mellitus, coronary artery disease with stable angina. I understand he had IV antibiotics, a CT scan with the head was done because of a fall and this was normal and a x-ray of the leg was done which showed no abnormality. Due to kidney disease his kidney doctor has taken him off his metformin and the patient's blood sugars have been running normal and his last hemoglobin A1c was 6 Electronic Signature(s) Signed: 12/17/2014 11:13:56 AM By: Christin Fudge MD, FACS Previous Signature: 12/17/2014 10:38:43 AM Version By: Christin Fudge MD, FACS Entered By: Christin Fudge on 12/17/2014 11:13:56 [name] [name] ([id number]) -------------------------------------------------------------------------------- Physical Exam Details [name] [name], [name] [name] 12/17/2014 10:15 Patient Name: Date of Service: P. AM Medical Record Patient Account Number: [id number] Number: Treating RN: Montey Hora Date of Birth/Sex: 02-May-1945 (68 y.o. Male) Other Clinician: [place], [place], [place] Physician: Physician/Extender: Referring Physician: Orland Mustard in Treatment: 0 Constitutional . Pulse regular. Respirations normal and unlabored. Afebrile. . Eyes Nonicteric. Reactive to light. Ears, Nose, Mouth, and Throat Lips, teeth, and gums WNL.Marland Kitchen Moist mucosa without lesions . Neck supple and nontender. No palpable supraclavicular or cervical adenopathy. Normal sized without goiter. Respiratory WNL. No retractions.. Cardiovascular Pedal Pulses WNL. his left ABI is 1.25. No clubbing, cyanosis or edema. he has varicose veins of bilateral lower extremities but has never been worked up.. Chest Breasts symmetical and no nipple  discharge.. Breast tissue WNL, no masses, lumps, or tenderness.. Gastrointestinal (GI) Abdomen without masses or tenderness.. No liver or spleen enlargement or tenderness.. Lymphatic No adneopathy. No adenopathy. No adenopathy. Musculoskeletal Adexa without tenderness or enlargement.. Digits and nails w/o clubbing, cyanosis, infection, petechiae, ischemia, or inflammatory conditions.. Integumentary (Hair, Skin) No suspicious lesions. No crepitus or fluctuance. No peri-wound warmth or erythema. No  masses.Marland Kitchen Psychiatric Judgement and insight Intact.. No evidence of depression, anxiety, or agitation.. Notes He has several puncture wounds and lacerated wounds of the right lower extremity in the region of the cuff both posteriorly and laterally and medially. Loose sutures have been placed by the ED and these are intact. He has minimal edema and cellulitis but no obvious infection. Electronic Signature(s) [name] [name], [name] [name] ([id number]) Signed: 12/17/2014 11:15:01 AM By: Christin Fudge MD, FACS Entered By: Christin Fudge on 12/17/2014 11:15:01 [name] [name], [name] [name] ([id number]) -------------------------------------------------------------------------------- Physician Orders Details [name] [name], [name] [name] 12/17/2014 10:15 Patient Name: Date of Service: P. AM Medical Record Patient Account Number: [id number] Number: Treating RN: Montey Hora Date of Birth/Sex: 04/17/1946 (68 y.o. Male) Other Clinician: [place], [place], [place] Physician: Physician/Extender: Referring Physician: Orland Mustard in Treatment: 0 Verbal / Phone Orders: Yes Clinician: Montey Hora Read Back and Verified: Yes Diagnosis Coding ICD-10 Coding Code Description E11.621 Type 2 diabetes mellitus with foot ulcer W54.0XXA Bitten by dog, initial encounter Wound Cleansing Wound #1 Right,Anterior Lower Leg o Clean wound with Normal Saline. o May Shower, gently pat wound  dry prior to applying new dressing. Wound #2 Right,Posterior Lower Leg o Clean wound with Normal Saline. o May Shower, gently pat wound dry prior to applying new dressing. Anesthetic Wound #1 Right,Anterior Lower Leg o Topical Lidocaine 4% cream applied to wound bed prior to debridement Wound #2 Right,Posterior Lower Leg o Topical Lidocaine 4% cream applied to wound bed prior to debridement Primary Wound Dressing Wound #1 Right,Anterior Lower Leg o Contact layer Wound #2 Right,Posterior Lower Leg o Contact layer Secondary Dressing Wound #1 Right,Anterior Lower Leg o ABD and Kerlix/Conform Wound #2 Right,Posterior Lower Leg [name] [name], [name] [name]. ([id number]) o ABD and Kerlix/Conform Dressing Change Frequency Wound #1 Right,Anterior Lower Leg o Change dressing every day. Wound #2 Right,Posterior Lower Leg o Change dressing every day. Follow-up Appointments Wound #1 Right,Anterior Lower Leg o Return Appointment in 1 week. Wound #2 Right,Posterior Lower Leg o Return Appointment in 1 week. Edema Control Wound #1 Right,Anterior Lower Leg o Elevate legs to the level of the heart and pump ankles as often as possible Wound #2 Right,Posterior Lower Leg o Elevate legs to the level of the heart and pump ankles as often as possible Additional Orders / Instructions Wound #1 Right,Anterior Lower Leg o Other: - Remain out of work for the week until next wound care appointment Wound #2 Right,Posterior Lower Leg o Other: - Remain out of work for the week until next wound care appointment Electronic Signature(s) Signed: 12/17/2014 4:07:43 PM By: Christin Fudge MD, FACS Signed: 12/17/2014 4:40:04 PM By: Montey Hora Entered By: Montey Hora on 12/17/2014 10:57:45 [name] [name] ([id number]) -------------------------------------------------------------------------------- Problem List Details [name] [name], [name] [name] 12/17/2014 10:15 Patient Name: Date of  Service: P. AM Medical Record Patient Account Number: [id number] Number: Treating RN: Montey Hora Date of Birth/Sex: 07/13/1945 (68 y.o. Male) Other Clinician: [place], [place], [place] Physician: Physician/Extender: Referring Physician: Orland Mustard in Treatment: 0 Active Problems ICD-10 Encounter Code Description Active Date Diagnosis E11.621 Type 2 diabetes mellitus with foot ulcer 12/17/2014 Yes W54.0XXA Bitten by dog, initial encounter 12/17/2014 Yes S81.811A Laceration without foreign body, right lower leg, initial 12/17/2014 Yes encounter L97.212 Non-pressure chronic ulcer of right calf with fat layer 12/17/2014 Yes exposed Inactive Problems Resolved Problems Electronic Signature(s) Signed: 12/17/2014 11:10:42 AM By: Christin Fudge MD, FACS Previous Signature: 12/17/2014 10:45:50 AM Version By: Christin Fudge MD, FACS Previous Signature: 12/17/2014 10:40:15  AM Version By: Christin Fudge MD, FACS Entered By: Christin Fudge on 12/17/2014 11:10:42 [name] [name] ([id number]) -------------------------------------------------------------------------------- Progress Note Details [name] [name], [name] [name] 12/17/2014 10:15 Patient Name: Date of Service: P. AM Medical Record Patient Account Number: [id number] Number: Treating RN: Montey Hora Date of Birth/Sex: 1946-02-15 (68 y.o. Male) Other Clinician: [place], [place], [place] Physician: Physician/Extender: Referring Physician: Orland Mustard in Treatment: 0 Subjective Chief Complaint Information obtained from Patient Patient presents to the wound care center for a consult due non healing wound. He sustained a dog bite yesterday and went to the ER where first aid and suturing was done. History of Present Illness (HPI) The following HPI elements were documented for the patient's wound: Location: right lower extremity Quality: Patient reports  experiencing a sharp pain to affected area(s). Severity: Patient states wound (s) are getting better. Duration: Patient states the wound has been present for ____1_days. Timing: Pain in wound is constant (hurts all the time) Context: The wound occurred when the patient was bitten by a dog yesterday morning Modifying Factors: Consults to this date include:was seen in the ED and given IV antibiotics and suturing was done. Associated Signs and Symptoms: Patient reports presence of swelling 69 year old gentleman was seen in the ED on 12/16/2014 with a dog bite. No notes are available on Epic. Past medical history significant for essential hypertension, COPD, sleep apnea, nephrolithiasis, hyperlipidemia, obesity, diabetes mellitus, coronary artery disease with stable angina. I understand he had IV antibiotics, a CT scan with the head was done because of a fall and this was normal and a x-ray of the leg was done which showed no abnormality. Due to kidney disease his kidney doctor has taken him off his metformin and the patient's blood sugars have been running normal and his last hemoglobin A1c was 6 Wound History Patient presents with 2 open wounds that have been present for approximately 1 day. Patient has been treating wounds in the following manner: xeroform. Laboratory tests have not been performed in the last month. Patient reportedly has not tested positive for an antibiotic resistant organism. Patient reportedly has not tested positive for osteomyelitis. Patient reportedly has not had testing performed to evaluate circulation in the legs. Patient experiences the following problems associated with their wounds: swelling. Patient History Information obtained from Patient. [name] [name], [name] [name] ([id number]) Allergies No Known Allergies Social History Former smoker - quit 10 years ago, Marital Status - Married, Alcohol Use - Rarely, Drug Use - No History, Caffeine Use - Never. Medical  History Respiratory Patient has history of Chronic Obstructive Pulmonary Disease (COPD) Cardiovascular Patient has history of Coronary Artery Disease, Hypertension Denies history of Congestive Heart Failure Endocrine Patient has history of Type II Diabetes Musculoskeletal Patient has history of Osteoarthritis Neurologic Patient has history of Neuropathy Oncologic Denies history of Received Chemotherapy, Received Radiation Psychiatric Denies history of Anorexia/bulimia, Confinement Anxiety Patient is treated with Controlled Diet. Blood sugar is not tested. Medical And Surgical History Notes Eyes black eye and swollen right eye Review of Systems (ROS) Constitutional Symptoms (General Health) The patient has no complaints or symptoms. Eyes Complains or has symptoms of Glasses / Contacts - glasses. Ear/Nose/Mouth/Throat The patient has no complaints or symptoms. Hematologic/Lymphatic The patient has no complaints or symptoms. Gastrointestinal The patient has no complaints or symptoms. Genitourinary Complains or has symptoms of Kidney failure/ Dialysis - CKD. Immunological The patient has no complaints or symptoms. Integumentary (Skin) The patient has no complaints or symptoms. Musculoskeletal The patient has no  complaints or symptoms. [name] [name], [name] [name] ([id number]) Psychiatric The patient has no complaints or symptoms. Medications gabapentin 100 mg capsule oral capsule oral pravastatin 20 mg tablet oral 1 1 tablet oral Cozaar 25 mg tablet oral 1 1 tablet oral fenofibrate 160 mg tablet oral tablet oral fluticasone 50 mcg/actuation nasal spray,suspension nasal spray,suspension nasal niacin 500 mg tablet oral 1 1 tablet oral amoxicillin 875 mg-potassium clavulanate 125 mg tablet oral 1 1 tablet oral pantoprazole 40 mg tablet,delayed release oral 1 1 tablet,delayed release (DR/EC) oral citalopram 20 mg tablet oral 1 1 tablet oral methocarbamol 750 mg tablet oral tablet  oral Objective Constitutional Pulse regular. Respirations normal and unlabored. Afebrile. Vitals Time Taken: 10:22 AM, Height: 65 in, Source: Stated, Weight: 218 lbs, Source: Stated, BMI: 36.3, Temperature: 97.8 F, Pulse: 83 bpm, Respiratory Rate: 20 breaths/min, Blood Pressure: 112/76 mmHg. Eyes Nonicteric. Reactive to light. Ears, Nose, Mouth, and Throat Lips, teeth, and gums WNL.Marland Kitchen Moist mucosa without lesions . Neck supple and nontender. No palpable supraclavicular or cervical adenopathy. Normal sized without goiter. Respiratory WNL. No retractions.. Cardiovascular Pedal Pulses WNL. his left ABI is 1.25. No clubbing, cyanosis or edema. he has varicose veins of bilateral lower extremities but has never been worked up.. Chest Breasts symmetical and no nipple discharge.. Breast tissue WNL, no masses, lumps, or tenderness.Marland Kitchen LEOR, DINSE ([id number]) Gastrointestinal (GI) Abdomen without masses or tenderness.. No liver or spleen enlargement or tenderness.. Lymphatic No adneopathy. No adenopathy. No adenopathy. Musculoskeletal Adexa without tenderness or enlargement.. Digits and nails w/o clubbing, cyanosis, infection, petechiae, ischemia, or inflammatory conditions.Marland Kitchen Psychiatric Judgement and insight Intact.. No evidence of depression, anxiety, or agitation.. General Notes: He has several puncture wounds and lacerated wounds of the right lower extremity in the region of the cuff both posteriorly and laterally and medially. Loose sutures have been placed by the ED and these are intact. He has minimal edema and cellulitis but no obvious infection. Integumentary (Hair, Skin) No suspicious lesions. No crepitus or fluctuance. No peri-wound warmth or erythema. No masses.. Wound #1 status is Open. Original cause of wound was Trauma. The wound is located on the Right,Anterior Lower Leg. The wound measures 9cm length x 5cm width x 0.2cm depth; 35.343cm^2 area and 7.069cm^3 volume.  The wound is limited to skin breakdown. There is no tunneling or undermining noted. There is a medium amount of sanguinous drainage noted. The wound margin is indistinct and nonvisible. There is large (67-100%) red, pink granulation within the wound bed. There is no necrotic tissue within the wound bed. The periwound skin appearance exhibited: Localized Edema, Moist. The periwound skin appearance did not exhibit: Callus, Crepitus, Excoriation, Fluctuance, Friable, Induration, Rash, Scarring, Dry/Scaly, Maceration, Atrophie Blanche, Cyanosis, Ecchymosis, Hemosiderin Staining, Mottled, Pallor, Rubor, Erythema. Periwound temperature was noted as No Abnormality. The periwound has tenderness on palpation. Wound #2 status is Open. Original cause of wound was Trauma. The wound is located on the Right,Posterior Lower Leg. The wound measures 0.5cm length x 2cm width x 0.2cm depth; 0.785cm^2 area and 0.157cm^3 volume. The wound is limited to skin breakdown. There is no tunneling or undermining noted. There is a small amount of sanguinous drainage noted. The wound margin is distinct with the outline attached to the wound base. There is large (67-100%) red granulation within the wound bed. There is no necrotic tissue within the wound bed. The periwound skin appearance exhibited: Localized Edema, Moist. The periwound skin appearance did not exhibit: Callus, Crepitus, Excoriation, Fluctuance, Friable, Induration, Rash, Scarring, Dry/Scaly, Maceration, Atrophie  Blanche, Cyanosis, Ecchymosis, Hemosiderin Staining, Mottled, Pallor, Rubor, Erythema. Periwound temperature was noted as No Abnormality. Assessment Active Problems CAYMON, CASSEY ([id number]) ICD-10 E11.621 - Type 2 diabetes mellitus with foot ulcer W54.0XXA - Bitten by dog, initial encounter [phone] - Laceration without foreign body, right lower leg, initial encounter [id number] - Non-pressure chronic ulcer of right calf with fat layer  exposed I have recommended addressing with Adaptic Xeroform gauze and a light bandage over this to be changed as required. Wound care instructions have been given, discussion about showers have been had and symptomatic treatment has been recommended. We will follow him back next week in the office to review the wound and take appropriate measures as needed. He is advised to continue taking the medications given by the ED including his Augmentin and elevate his limb as much as possible. Plan Wound Cleansing: Wound #1 Right,Anterior Lower Leg: Clean wound with Normal Saline. May Shower, gently pat wound dry prior to applying new dressing. Wound #2 Right,Posterior Lower Leg: Clean wound with Normal Saline. May Shower, gently pat wound dry prior to applying new dressing. Anesthetic: Wound #1 Right,Anterior Lower Leg: Topical Lidocaine 4% cream applied to wound bed prior to debridement Wound #2 Right,Posterior Lower Leg: Topical Lidocaine 4% cream applied to wound bed prior to debridement Primary Wound Dressing: Wound #1 Right,Anterior Lower Leg: Contact layer Wound #2 Right,Posterior Lower Leg: Contact layer Secondary Dressing: Wound #1 Right,Anterior Lower Leg: ABD and Kerlix/Conform Wound #2 Right,Posterior Lower Leg: ABD and Kerlix/Conform Dressing Change Frequency: Wound #1 Right,Anterior Lower Leg: Change dressing every day. Wound #2 Right,Posterior Lower Leg: Change dressing every day. Follow-up Appointments: Wound #1 Right,Anterior Lower Leg: Return Appointment in 1 week. KAISER, GUNDLACH ([id number]) Wound #2 Right,Posterior Lower Leg: Return Appointment in 1 week. Edema Control: Wound #1 Right,Anterior Lower Leg: Elevate legs to the level of the heart and pump ankles as often as possible Wound #2 Right,Posterior Lower Leg: Elevate legs to the level of the heart and pump ankles as often as possible Additional Orders / Instructions: Wound #1 Right,Anterior Lower  Leg: Other: - Remain out of work for the week until next wound care appointment Wound #2 Right,Posterior Lower Leg: Other: - Remain out of work for the week until next wound care appointment I have recommended addressing with Adaptic Xeroform gauze and a light bandage over this to be changed as required. Wound care instructions have been given, discussion about showers have been had and symptomatic treatment has been recommended. We will follow him back next week in the office to review the wound and take appropriate measures as needed. He is advised to continue taking the medications given by the ED including his Augmentin and elevate his limb as much as possible. Electronic Signature(s) Signed: 12/17/2014 11:16:19 AM By: Christin Fudge MD, FACS Entered By: Christin Fudge on 12/17/2014 11:16:19 [name] [name] ([id number]) -------------------------------------------------------------------------------- ROS/PFSH Details DERRIE, CAMINITI 12/17/2014 10:15 Patient Name: Date of Service: P. AM Medical Record Patient Account Number: [id number] Number: Treating RN: Montey Hora Date of Birth/Sex: 1945-05-29 (68 y.o. Male) Other Clinician: [place], [place], [place] Physician: Physician/Extender: Referring Physician: Orland Mustard in Treatment: 0 Information Obtained From Patient Wound History Do you currently have one or more open woundso Yes How many open wounds do you currently haveo 2 Approximately how long have you had your woundso 1 day How have you been treating your wound(s) until nowo xeroform Has your wound(s) ever healed and then re-openedo No Have you had  any lab work done in the past montho No Have you tested positive for an antibiotic resistant organism (MRSA, VRE)o No Have you tested positive for osteomyelitis (bone infection)o No Have you had any tests for circulation on your legso No Have you had other problems associated  with your woundso Swelling Eyes Complaints and Symptoms: Positive for: Glasses / Contacts - glasses Medical History: Past Medical History Notes: black eye and swollen right eye Genitourinary Complaints and Symptoms: Positive for: Kidney failure/ Dialysis - CKD Constitutional Symptoms (General Health) Complaints and Symptoms: No Complaints or Symptoms Ear/Nose/Mouth/Throat Complaints and Symptoms: No Complaints or Symptoms Hematologic/Lymphatic RIAL, SABIR ([id number]) Complaints and Symptoms: No Complaints or Symptoms Respiratory Medical History: Positive for: Chronic Obstructive Pulmonary Disease (COPD) Cardiovascular Medical History: Positive for: Coronary Artery Disease; Hypertension Negative for: Congestive Heart Failure Gastrointestinal Complaints and Symptoms: No Complaints or Symptoms Endocrine Medical History: Positive for: Type II Diabetes Time with diabetes: 7 years Treated with: Diet Blood sugar tested every day: No Immunological Complaints and Symptoms: No Complaints or Symptoms Integumentary (Skin) Complaints and Symptoms: No Complaints or Symptoms Musculoskeletal Complaints and Symptoms: No Complaints or Symptoms Medical History: Positive for: Osteoarthritis Neurologic Medical History: Positive for: Neuropathy Oncologic TIMITHY, DOHN ([id number]) Medical History: Negative for: Received Chemotherapy; Received Radiation Psychiatric Complaints and Symptoms: No Complaints or Symptoms Medical History: Negative for: Anorexia/bulimia; Confinement Anxiety Immunizations Immunization Notes: unknown but up to date Family and Social History Former smoker - quit 10 years ago; Marital Status - Married; Alcohol Use: Rarely; Drug Use: No History; Caffeine Use: Never; Financial Concerns: No; Food, Clothing or Shelter Needs: No; Support System Lacking: No; Transportation Concerns: No; Advanced Directives: No; Patient does not want  information on Advanced Directives Physician Affirmation I have reviewed and agree with the above information. Electronic Signature(s) Signed: 12/17/2014 10:46:06 AM By: Christin Fudge MD, FACS Signed: 12/17/2014 4:40:04 PM By: Montey Hora Entered By: Christin Fudge on 12/17/2014 10:46:05 [name] [name] ([id number]) -------------------------------------------------------------------------------- SuperBill Details Patient Name: [name] [name]. Date of Service: 12/17/2014 Medical Record Number: [id number] Patient Account Number: [id number] Date of Birth/Sex: 12-Feb-1946 (69 y.o. Male) Treating RN: Montey Hora Primary Care Physician: Lamonte Sakai Other Clinician: Referring Physician: Lamonte Sakai Treating Physician/Extender: Frann Rider in Treatment: 0 Diagnosis Coding ICD-10 Codes Code Description E11.621 Type 2 diabetes mellitus with foot ulcer W54.0XXA Bitten by dog, initial encounter [id number] Laceration without foreign body, right lower leg, initial encounter L97.212 Non-pressure chronic ulcer of right calf with fat layer exposed Facility Procedures CPT4 Code: [id number] Description: [phone] - WOUND CARE VISIT-LEV 5 EST PT Modifier: Quantity: 1 Physician Procedures CPT4 Code Description: [id number] - WC PHYS LEVEL 4 - NEW PT ICD-10 Description Diagnosis E11.621 Type 2 diabetes mellitus with foot ulcer W54.0XXA Bitten by dog, initial encounter [id number] Laceration without foreign body, right lower leg, init  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

## 2014-12-18 NOTE — Progress Notes (Signed)
[name] [name], [name] [name] ([id number]) Visit Report for 12/17/2014 Abuse/Suicide Risk Screen Details [name] [name], [name] [name] 12/17/2014 10:15 Patient Name: Date of Service: P. AM Medical Record Patient Account Number: [id number] Number: Treating RN: Montey Hora Date of Birth/Sex: December 10, 1945 (68 y.o. Male) Other Clinician: [place], [place], [place] Physician: Physician/Extender: Referring Physician: Orland Mustard in Treatment: 0 Abuse/Suicide Risk Screen Items Answer ABUSE/SUICIDE RISK SCREEN: Has anyone close to you tried to hurt or harm you recentlyo No Do you feel uncomfortable with anyone in your familyo No Has anyone forced you do things that you didnot want to doo No Do you have any thoughts of harming yourselfo No Patient displays signs or symptoms of abuse and/or neglect. No Electronic Signature(s) Signed: 12/17/2014 4:40:04 PM By: Montey Hora Entered By: Montey Hora on 12/17/2014 10:30:32 [name] [name] ([id number]) -------------------------------------------------------------------------------- Activities of Daily Living Details [name] [name] 12/17/2014 10:15 Patient Name: Date of Service: P. AM Medical Record Patient Account Number: [id number] Number: Treating RN: Montey Hora Date of Birth/Sex: Nov 12, 1945 (68 y.o. Male) Other Clinician: [place], [place], [place] Physician: Physician/Extender: Referring Physician: Orland Mustard in Treatment: 0 Activities of Daily Living Items Answer Activities of Daily Living (Please select one for each item) Drive Automobile Completely Able Take Medications Completely Able Use Telephone Completely Able Care for Appearance Completely Able Use Toilet Completely Able Bath / Shower Completely Able Dress Self Completely Able Feed Self Completely Able Walk Completely Able Get In / Out Bed Completely Able Housework Completely  Able Prepare Meals Completely Able Handle Money Completely Able Shop for Self Completely Able Electronic Signature(s) Signed: 12/17/2014 4:40:04 PM By: Montey Hora Entered By: Montey Hora on 12/17/2014 10:30:52 [name] [name] ([id number]) -------------------------------------------------------------------------------- Education Assessment Details [name] [name], [name] [name] 12/17/2014 10:15 Patient Name: Date of Service: P. AM Medical Record Patient Account Number: [id number] Number: Treating RN: Montey Hora Date of Birth/Sex: 1945-07-06 (68 y.o. Male) Other Clinician: Primary Care Treating Ashok Cordia Physician: Physician/Extender: Referring Physician: Orland Mustard in Treatment: 0 Primary Learner Assessed: Patient Learning Preferences/Education Level/Primary Language Learning Preference: Explanation, Demonstration Highest Education Level: High School Preferred Language: English Cognitive Barrier Assessment/Beliefs Language Barrier: No Translator Needed: No Memory Deficit: No Emotional Barrier: No Cultural/Religious Beliefs Affecting Medical No Care: Physical Barrier Assessment Impaired Vision: No Impaired Hearing: No Decreased Hand dexterity: No Knowledge/Comprehension Assessment Knowledge Level: Medium Comprehension Level: Medium Ability to understand written Medium instructions: Ability to understand verbal Medium instructions: Motivation Assessment Anxiety Level: Calm Cooperation: Cooperative Education Importance: Acknowledges Need Interest in Health Problems: Asks Questions Perception: Coherent Willingness to Engage in Self- Medium Management Activities: Medium [name] [name], [name] [name] ([id number]) Readiness to Engage in Self- Management Activities: Electronic Signature(s) Signed: 12/17/2014 4:40:04 PM By: Montey Hora Entered By: Montey Hora on 12/17/2014 10:31:18 [name] [name]  ([id number]) -------------------------------------------------------------------------------- Fall Risk Assessment Details [name] [name], [name] [name] 12/17/2014 10:15 Patient Name: Date of Service: P. AM Medical Record Patient Account Number: [id number] Number: Treating RN: Montey Hora Date of Birth/Sex: Jan 27, 1946 (68 y.o. Male) Other Clinician: Primary Care Treating Ashok Cordia Physician: Physician/Extender: Referring Physician: Orland Mustard in Treatment: 0 Fall Risk Assessment Items FALL RISK ASSESSMENT: History of falling - immediate or within 3 months 25 Yes Secondary diagnosis 0 No Ambulatory aid None/bed rest/wheelchair/nurse 0 Yes Crutches/cane/walker 0 No Furniture 0 No IV Access/Saline Lock 0 No Gait/Training Normal/bed rest/immobile 0 Yes Weak 0 No Impaired 0 No Mental Status Oriented to own ability 0 Yes Electronic Signature(s) Signed: 12/17/2014 4:40:04  PM By: Montey Hora Entered By: Montey Hora on 12/17/2014 10:31:26 [name] [name] ([id number]) -------------------------------------------------------------------------------- Foot Assessment Details [name] [name], [name] [name] 12/17/2014 10:15 Patient Name: Date of Service: P. AM Medical Record Patient Account Number: [id number] Number: Treating RN: Montey Hora Date of Birth/Sex: March 09, 1946 (68 y.o. Male) Other Clinician: Primary Care Treating Ashok Cordia Physician: Physician/Extender: Referring Physician: Orland Mustard in Treatment: 0 Foot Assessment Items Site Locations + = Sensation present, - = Sensation absent, C = Callus, U = Ulcer R = Redness, W = Warmth, M = Maceration, PU = Pre-ulcerative lesion F = Fissure, S = Swelling, D = Dryness Assessment Right: Left: Other Deformity: No No Prior Foot Ulcer: No No Prior Amputation: No No Charcot Joint: No No Ambulatory Status: Ambulatory With Help Assistance Device: Cane Gait:  Steady Electronic Signature(s) Signed: 12/17/2014 4:40:04 PM By: Vickki Muff, Theophilus Bones ([id number]) Entered By: Montey Hora on 12/17/2014 10:32:00 [name] [name] ([id number]) -------------------------------------------------------------------------------- Nutrition Risk Assessment Details [name] [name], [name] [name] 12/17/2014 10:15 Patient Name: Date of Service: P. AM Medical Record Patient Account Number: [id number] Number: Treating RN: Montey Hora Date of Birth/Sex: 1945/12/10 (68 y.o. Male) Other Clinician: [place], [place], [place] Physician: Physician/Extender: Referring Physician: Orland Mustard in Treatment: 0 Height (in): 65 Weight (lbs): 218 Body Mass Index (BMI): 36.3 Nutrition Risk Assessment Items NUTRITION RISK SCREEN: I have an illness or condition that made me change the kind and/or 0 No amount of food I eat I eat fewer than two meals per day 0 No I eat few fruits and vegetables, or milk products 0 No I have three or more drinks of beer, liquor or wine almost every day 0 No I have tooth or mouth problems that make it hard for me to eat 0 No I don't always have enough money to buy the food I need 0 No I eat alone most of the time 0 No I take three or more different prescribed or over-the-counter drugs a 1 Yes day Without wanting to, I have lost or gained 10 pounds in the last six 0 No months I am not always physically able to shop, cook and/or feed myself 0 No Nutrition Protocols Good Risk Protocol 0 No interventions needed Moderate Risk Protocol Electronic Signature(s) Signed: 12/17/2014 4:40:04 PM By: Montey Hora Entered By: Montey Hora on 12/17/2014 10:31:32

## 2014-12-18 NOTE — Progress Notes (Signed)
[name] [name], [name] [name] ([id number]) Visit Report for 12/17/2014 Allergy List Details [name] [name], [name] [name] 12/17/2014 10:15 Patient Name: Date of Service: P. AM Medical Record Patient Account Number: [id number] Number: Treating RN: Montey Hora Date of Birth/Sex: 01-Dec-1945 (69 y.o. Male) Other Clinician: Primary Care Physician: Lamonte Sakai Treating Christin Fudge Referring Physician: Lamonte Sakai Physician/Extender: Suella Grove in Treatment: 0 Allergies Active Allergies No Known Allergies Allergy Notes Electronic Signature(s) Signed: 12/17/2014 4:40:04 PM By: Montey Hora Entered By: Montey Hora on 12/17/2014 10:24:10 [name] [name] ([id number]) -------------------------------------------------------------------------------- Arrival Information Details [name] [name] 12/17/2014 10:15 Patient Name: Date of Service: P. AM Medical Record Patient Account Number: [id number] Number: Treating RN: Montey Hora Date of Birth/Sex: October 15, 1945 (69 y.o. Male) Other Clinician: Primary Care Physician: Lamonte Sakai Treating Christin Fudge Referring Physician: Lamonte Sakai Physician/Extender: Suella Grove in Treatment: 0 Visit Information Patient Arrived: Cane Arrival Time: 10:20 Accompanied By: spouse Transfer Assistance: None Patient Identification Verified: Yes Secondary Verification Process Yes Completed: Patient Has Alerts: Yes Patient Alerts: DMII aspirin 81 ABI L 1.25 NO ABI R R/T wound Electronic Signature(s) Signed: 12/17/2014 4:40:04 PM By: Montey Hora Entered By: Montey Hora on 12/17/2014 10:47:28 [name] [name] ([id number]) -------------------------------------------------------------------------------- Clinic Level of Care Assessment Details [name] [name] 12/17/2014 10:15 Patient Name: Date of Service: P. AM Medical Record Patient Account Number: [id number] Number: Treating RN: Montey Hora Date of  Birth/Sex: 1945-08-05 (69 y.o. Male) Other Clinician: Primary Care Physician: Lamonte Sakai Treating Christin Fudge Referring Physician: Lamonte Sakai Physician/Extender: Suella Grove in Treatment: 0 Clinic Level of Care Assessment Items TOOL 2 Quantity Score []  - Use when only an EandM is performed on the INITIAL visit 0 ASSESSMENTS - Nursing Assessment / Reassessment X - General Physical Exam (combine w/ comprehensive assessment (listed just 1 20 below) when performed on new pt. evals) X - Comprehensive Assessment (HX, ROS, Risk Assessments, Wounds Hx, etc.) 1 25 ASSESSMENTS - Wound and Skin Assessment / Reassessment []  - Simple Wound Assessment / Reassessment - one wound 0 X - Complex Wound Assessment / Reassessment - multiple wounds 2 5 []  - Dermatologic / Skin Assessment (not related to wound area) 0 ASSESSMENTS - Ostomy and/or Continence Assessment and Care []  - Incontinence Assessment and Management 0 []  - Ostomy Care Assessment and Management (repouching, etc.) 0 PROCESS - Coordination of Care X - Simple Patient / Family Education for ongoing care 1 15 []  - Complex (extensive) Patient / Family Education for ongoing care 0 X - Staff obtains Programmer, systems, Records, Test Results / Process Orders 1 10 []  - Staff telephones HHA, Nursing Homes / Clarify orders / etc 0 []  - Routine Transfer to another Facility (non-emergent condition) 0 []  - Routine Hospital Admission (non-emergent condition) 0 X - New Admissions / Biomedical engineer / Ordering NPWT, Apligraf, etc. 1 15 []  - Emergency Hospital Admission (emergent condition) 0 [name] [name], WALQUIST. ([id number]) X - Simple Discharge Coordination 1 10 []  - Complex (extensive) Discharge Coordination 0 PROCESS - Special Needs []  - Pediatric / Minor Patient Management 0 []  - Isolation Patient Management 0 []  - Hearing / Language / Visual special needs 0 []  - Assessment of Community assistance (transportation, D/C planning, etc.) 0 []  -  Additional assistance / Altered mentation 0 []  - Support Surface(s) Assessment (bed, cushion, seat, etc.) 0 INTERVENTIONS - Wound Cleansing / Measurement X - Wound Imaging (photographs - any number of wounds) 1 5 []  - Wound Tracing (instead of photographs) 0 []  - Simple Wound Measurement - one wound 0 X - Complex Wound Measurement -  multiple wounds 2 5 []  - Simple Wound Cleansing - one wound 0 X - Complex Wound Cleansing - multiple wounds 2 5 INTERVENTIONS - Wound Dressings X - Small Wound Dressing one or multiple wounds 2 10 []  - Medium Wound Dressing one or multiple wounds 0 []  - Large Wound Dressing one or multiple wounds 0 []  - Application of Medications - injection 0 INTERVENTIONS - Miscellaneous []  - External ear exam 0 []  - Specimen Collection (cultures, biopsies, blood, body fluids, etc.) 0 []  - Specimen(s) / Culture(s) sent or taken to Lab for analysis 0 []  - Patient Transfer (multiple staff / [name] / Similar devices) 0 []  - Simple Staple / Suture removal (25 or less) 0 [name] [name], STACHOWSKI. ([id number]) []  - Complex Staple / Suture removal (26 or more) 0 []  - Hypo / Hyperglycemic Management (close monitor of Blood Glucose) 0 X - Ankle / Brachial Index (ABI) - do not check if billed separately 1 15 Has the patient been seen at the hospital within the last three years: Yes Total Score: 165 Level Of Care: New/Established - Level 5 Electronic Signature(s) Signed: 12/17/2014 4:40:04 PM By: Montey Hora Entered By: Montey Hora on 12/17/2014 10:55:33 [name] [name] ([id number]) -------------------------------------------------------------------------------- Encounter Discharge Information Details [name] [name] 12/17/2014 10:15 Patient Name: Date of Service: P. AM Medical Record Patient Account Number: [id number] Number: Treating RN: Montey Hora Date of Birth/Sex: Sep 07, 1945 (69 y.o. Male) Other Clinician: Primary Care Physician: Lamonte Sakai Treating Christin Fudge Referring Physician: Lamonte Sakai Physician/Extender: Suella Grove in Treatment: 0 Encounter Discharge Information Items Discharge Pain Level: 0 Discharge Condition: Stable Ambulatory Status: Cane Discharge Destination: Home Transportation: Private Auto Accompanied By: spouse Schedule Follow-up Appointment: Yes Medication Reconciliation completed No and provided to Patient/Care Nicolas Sisler: Provided on Clinical Summary of Care: 12/17/2014 Form Type Recipient Paper Patient WB Electronic Signature(s) Signed: 12/17/2014 11:13:51 AM By: Ruthine Dose Entered By: Ruthine Dose on 12/17/2014 11:13:51 [name] [name] ([id number]) -------------------------------------------------------------------------------- Lower Extremity Assessment Details [name] [name], [name] [name] 12/17/2014 10:15 Patient Name: Date of Service: P. AM Medical Record Patient Account Number: [id number] Number: Treating RN: Montey Hora Date of Birth/Sex: 1946-03-28 (69 y.o. Male) Other Clinician: Primary Care Physician: Lamonte Sakai Treating Christin Fudge Referring Physician: Lamonte Sakai Physician/Extender: Suella Grove in Treatment: 0 Edema Assessment Assessed: [Left: No] [Right: No] E[Left: dema] [Right: :] Calf Left: Right: Point of Measurement: 32 cm From Medial Instep 39.5 cm 39.2 cm Ankle Left: Right: Point of Measurement: 10 cm From Medial Instep 21.8 cm 22.2 cm Vascular Assessment Pulses: Posterior Tibial Palpable: [Left:Yes] [Right:Yes] Dorsalis Pedis Palpable: [Left:Yes] [Right:Yes] Extremity colors, hair growth, and conditions: Extremity Color: [Left:Normal] [Right:Normal] Hair Growth on Extremity: [Left:Yes] [Right:Yes] Temperature of Extremity: [Left:Warm] [Right:Warm] Capillary Refill: [Left:< 3 seconds] [Right:< 3 seconds] Blood Pressure: Brachial: [Right:112] Dorsalis Pedis: [Left:Dorsalis Pedis: 140] Ankle: Posterior Tibial: [Left:Posterior Tibial:  [id number] [Right:1.25] Toe Nail Assessment Left: Right: Thick: No No Discolored: No No Deformed: No No Improper Length and Hygiene: No No [name] [name], [name] [name] ([id number]) Electronic Signature(s) Signed: 12/17/2014 4:40:04 PM By: Montey Hora Entered By: Montey Hora on 12/17/2014 10:39:30 [name] [name] ([id number]) -------------------------------------------------------------------------------- Multi Wound Chart Details [name] [name], [name] [name] 12/17/2014 10:15 Patient Name: Date of Service: P. AM Medical Record Patient Account Number: [id number] Number: Treating RN: Montey Hora Date of Birth/Sex: 03/24/1946 (69 y.o. 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] [2:the Lower Extremity] Comorbid History: [1:Chronic Obstructive Pulmonary Disease (COPD), Coronary Artery (COPD), Coronary Artery Disease, Hypertension, Type II Diabetes, Osteoarthritis, Neuropathy Osteoarthritis, Neuropathy] [2:Chronic Obstructive Pulmonary Disease Disease,  Hypertension, Type II Diabetes,] [N/A:N/A] Date Acquired: [1:12/16/2014] [2:12/16/2014] [N/A:N/A] Weeks of Treatment: [1:0] [2:0] [N/A:N/A] Wound Status: [1:Open] [2:Open] [N/A:N/A] Measurements L x W x D 9x5x0.2 [2:0.5x2x0.2] [N/A:N/A] (cm) Area (cm) : [1:35.343] [2:0.785] [N/A:N/A] Volume (cm) :  [1:7.069] [2:0.157] [N/A:N/A] % Reduction in Area: [1:0.00%] [2:0.00%] [N/A:N/A] % Reduction in Volume: 0.00% [2:0.00%] [N/A:N/A] Classification: [1:Full Thickness Without Exposed Support Structures] [2:Full Thickness Without Exposed Support Structures] [N/A:N/A] HBO Classification: [1:Grade 1] [2:Grade 1] [N/A:N/A] Exudate Amount: [1:Medium] [2:Small] [N/A:N/A] Exudate Type: [1:Sanguinous] [2:Sanguinous] [N/A:N/A] Exudate Color: red red N/A Wound Margin: Indistinct, nonvisible Distinct, outline attached N/A Granulation Amount: Large (67-100%) Large (67-100%) N/A Granulation Quality: Red, Pink Red N/A Necrotic Amount: None Present (0%) None Present (0%) N/A Exposed Structures: Fascia: No Fascia: No N/A Fat: No Fat: No Tendon: No Tendon: No Muscle: No Muscle: No Joint: No Joint: No Bone: No Bone: No Limited to Skin Limited to Skin Breakdown Breakdown Epithelialization: None None N/A Periwound Skin Texture: Edema: Yes Edema: Yes N/A Excoriation: No Excoriation: No Induration: No Induration: No Callus: No Callus: No Crepitus: No Crepitus: No Fluctuance: No Fluctuance: No Friable: No Friable: No Rash: No Rash: No Scarring: No Scarring: No Periwound Skin Moist: Yes Moist: Yes N/A Moisture: Maceration: No Maceration: No Dry/Scaly: No Dry/Scaly: No Periwound Skin Color: Atrophie Blanche: No Atrophie Blanche: No N/A Cyanosis: No Cyanosis: No Ecchymosis: No Ecchymosis: No Erythema: No Erythema: No Hemosiderin Staining: No Hemosiderin Staining: No Mottled: No Mottled: No Pallor: No Pallor: No Rubor: No Rubor: No Temperature: No Abnormality No Abnormality N/A Tenderness on Yes No N/A Palpation: Wound Preparation: Ulcer Cleansing: Ulcer Cleansing: N/A Rinsed/Irrigated with Rinsed/Irrigated with Saline Saline Topical Anesthetic Topical Anesthetic Applied: Other: lidocaine Applied: Other: lidocaine 4% 4% Treatment Notes Electronic Signature(s) Signed:  12/17/2014 4:40:04 PM By: Vickki Muff, Theophilus Bones ([id number]) Entered By: Montey Hora on 12/17/2014 10:48:23 MD., HOOS ([id number]) -------------------------------------------------------------------------------- Multi-Disciplinary Care Plan Details [name] [name], [name] [name] 12/17/2014 10:15 Patient Name: Date of Service: P. AM Medical Record Patient Account Number: [id number] Number: Treating RN: Montey Hora Date of Birth/Sex: [birth year] (69 y.o. Male) Other Clinician: Primary Care Physician: Lamonte Sakai Treating Christin Fudge Referring Physician: Lamonte Sakai Physician/Extender: Suella Grove in Treatment: 0 Active Inactive Abuse / Safety / Falls / Self Care Management Nursing Diagnoses: Potential for falls Goals: Patient will remain injury free Date Initiated: 12/17/2014 Goal Status: Active Interventions: Assess fall risk on admission and as needed Notes: Orientation to the Wound Care Program Nursing Diagnoses: Knowledge deficit related to the wound healing center program Goals: Patient/caregiver will verbalize understanding of the [place] Program Date Initiated: 12/17/2014 Goal Status: Active Interventions: Provide education on orientation to the wound center Notes: Wound/Skin Impairment Nursing Diagnoses: Impaired tissue integrity Goals: [name] [name], [name] [name] ([id number]) Ulcer/skin breakdown will have a volume reduction of 30% by week 4 Date Initiated: 12/17/2014 Goal Status: Active Ulcer/skin breakdown will have a volume reduction of 50% by week 8 Date Initiated: 12/17/2014 Goal Status: Active Ulcer/skin breakdown will have a volume reduction of 80% by week 12  Date Initiated: 12/17/2014 Goal Status: Active Ulcer/skin breakdown will heal within 14 weeks Date Initiated: 12/17/2014 Goal Status: Active Interventions: Assess ulceration(s) every visit Notes: Electronic Signature(s) Signed: 12/17/2014 4:40:04 PM By:  Montey Hora Entered By: Montey Hora on 12/17/2014 10:41:59 [name] [name] ([id number]) -------------------------------------------------------------------------------- Pain Assessment Details [name] [name], [name] [name] 12/17/2014 10:15 Patient Name: Date of Service: P. AM Medical Record Patient Account Number: [id number] Number: Treating RN: Montey Hora Date of Birth/Sex: 06-22-1945 (69 y.o. Male) Other Clinician: Primary Care Physician: Lamonte Sakai Treating Christin Fudge Referring Physician: Lamonte Sakai Physician/Extender: Suella Grove in Treatment: 0 Active Problems Location of Pain Severity and Description of Pain Patient Has Paino No Site Locations Pain Management and Medication Current Pain Management: Electronic Signature(s) Signed: 12/17/2014 4:40:04 PM By: Montey Hora Entered By: Montey Hora on 12/17/2014 10:23:57 [name] [name] ([id number]) -------------------------------------------------------------------------------- Patient/Caregiver Education Details KIRO, STEMLER 12/17/2014 10:15 Patient Name: Date of Service: P. AM Medical Record Patient Account Number: [id number] Number: Treating RN: Montey Hora Date of Birth/Gender: 05-11-1945 (69 y.o. Male) Other Clinician: Primary Care Physician: Lamonte Sakai Treating Christin Fudge Referring Physician: Lamonte Sakai Physician/Extender: Suella Grove in Treatment: 0 Education Assessment Education Provided To: Patient and Caregiver Education Topics Provided Infection: Handouts: Other: reportable s/s of infection Methods: Demonstration, Explain/Verbal Responses: State content correctly Wound/Skin Impairment: Handouts: Other: wound care as ordered Methods: Demonstration, Explain/Verbal Responses: State content correctly Electronic Signature(s) Signed: 12/17/2014 4:40:04 PM By: Montey Hora Entered By: Montey Hora on 12/17/2014 10:49:09 [name] [name]  ([id number]) -------------------------------------------------------------------------------- Wound Assessment Details [name] [name], [name] [name] 12/17/2014 10:15 Patient Name: Date of Service: P. AM Medical Record Patient Account Number: [id number] Number: Treating RN: Afful, RN, BSN, Velva Harman Date of Birth/Sex: 09-25-1945 (69 y.o. Male) Other Clinician: Primary Care Physician: Lamonte Sakai Treating Christin Fudge Referring Physician: Lamonte Sakai Physician/Extender: Suella Grove in Treatment: 0 Wound Status Wound Number: 1 Primary Trauma, Other Etiology: Wound Location: Right Lower Leg - Anterior Secondary Diabetic Wound/Ulcer of the Lower Wounding Event: Trauma Etiology: Extremity Date Acquired: 12/16/2014 Wound Open Weeks Of Treatment: 0 Status: Clustered Wound: No Notes: Patient bitten by a dog yesterday, went to the ER and 9 stitches used to suture skin flap together. Comorbid Chronic Obstructive Pulmonary History: Disease (COPD), Coronary Artery Disease, Hypertension, Type II Diabetes, Osteoarthritis, Neuropathy Photos Photo Uploaded By: Regan Lemming on 12/17/2014 15:30:02 Wound Measurements Length: (cm) 9 % Reduction i Width: (cm) 5 % Reduction i Depth: (cm) 0.2 Epithelializa Area: (cm) 35.343 Tunneling: Volume: (cm) 7.069 Undermining: n Area: 0% n Volume: 0% tion: None No No Wound Description Full Thickness Without Exposed Foul Odor Afte Classification: Support Structures Grade 1 JAHRELL, SALASAR ([id number]) r Cleansing: No Diabetic Severity Earleen Newport): Wound Margin: Indistinct, nonvisible Exudate Amount: Medium Exudate Type: Sanguinous Exudate Color: red Wound Bed Granulation Amount: Large (67-100%) Exposed Structure Granulation Quality: Red, Pink Fascia Exposed: No Necrotic Amount: None Present (0%) Fat Layer Exposed: No Tendon Exposed: No Muscle Exposed: No Joint Exposed: No Bone Exposed: No Limited to Skin Breakdown Periwound Skin  Texture Texture Color No Abnormalities Noted: No No Abnormalities Noted: No Callus: No Atrophie Blanche: No Crepitus: No Cyanosis: No Excoriation: No Ecchymosis: No Fluctuance: No Erythema: No Friable: No Hemosiderin Staining: No Induration: No Mottled: No Localized Edema: Yes Pallor: No Rash: No Rubor: No Scarring: No Temperature / Pain Moisture Temperature: No Abnormality No Abnormalities Noted: No Tenderness on Palpation: Yes Dry / Scaly: No Maceration: No Moist: Yes Wound Preparation Ulcer Cleansing: Rinsed/Irrigated with Saline Topical Anesthetic Applied: Other: lidocaine 4%, Treatment Notes Wound #1 (  Right, Anterior Lower Leg) 1. Cleansed with: Clean wound with Normal Saline 2. Anesthetic Topical Lidocaine 4% cream to wound bed prior to debridement 4. Dressing Applied: MATEEN, VANZEELAND ([id number]) Contact layer 5. Secondary Dressing Applied ABD and Kerlix/Conform 7. Secured with Tape Tubigrip Electronic Signature(s) Signed: 12/17/2014 10:38:21 AM By: Regan Lemming BSN, RN Entered By: Regan Lemming on 12/17/2014 10:38:21 [name] [name] ([id number]) -------------------------------------------------------------------------------- Wound Assessment Details MERCED, GURRIERI 12/17/2014 10:15 Patient Name: Date of Service: P. AM Medical Record Patient Account Number: [id number] Number: Treating RN: Afful, RN, BSN, Velva Harman Date of Birth/Sex: 1945/12/13 (69 y.o. Male) Other Clinician: Primary Care Physician: Lamonte Sakai Treating Christin Fudge Referring Physician: Lamonte Sakai Physician/Extender: Suella Grove in Treatment: 0 Wound Status Wound Number: 2 Primary Trauma, Other Etiology: Wound Location: Right Lower Leg - Posterior Secondary Diabetic Wound/Ulcer of the Lower Wounding Event: Trauma Etiology: Extremity Date Acquired: 12/16/2014 Wound Open Weeks Of Treatment: 0 Status: Clustered Wound: No Notes: Dog Bite yesterday. 1 stitch  sutured in place by ER MD. Comorbid Chronic Obstructive Pulmonary History: Disease (COPD), Coronary Artery Disease, Hypertension, Type II Diabetes, Osteoarthritis, Neuropathy Photos Photo Uploaded By: Regan Lemming on 12/17/2014 15:30:02 Wound Measurements Length: (cm) 0.5 % Reduction i Width: (cm) 2 % Reduction i Depth: (cm) 0.2 Epithelializa Area: (cm) 0.785 Tunneling: Volume: (cm) 0.157 Undermining: n Area: 0% n Volume: 0% tion: None No No Wound Description Full Thickness Without Exposed Foul Odor Afte Classification: Support Structures Diabetic Severity Grade 1 (Wagner): ARYE, ESPEJO ([id number]) r Cleansing: No Wound Margin: Distinct, outline attached Exudate Amount: Small Exudate Type: Sanguinous Exudate Color: red Wound Bed Granulation Amount: Large (67-100%) Exposed Structure Granulation Quality: Red Fascia Exposed: No Necrotic Amount: None Present (0%) Fat Layer Exposed: No Tendon Exposed: No Muscle Exposed: No Joint Exposed: No Bone Exposed: No Limited to Skin Breakdown Periwound Skin Texture Texture Color No Abnormalities Noted: No No Abnormalities Noted: No Callus: No Atrophie Blanche: No Crepitus: No Cyanosis: No Excoriation: No Ecchymosis: No Fluctuance: No Erythema: No Friable: No Hemosiderin Staining: No Induration: No Mottled: No Localized Edema: Yes Pallor: No Rash: No Rubor: No Scarring: No Temperature / Pain Moisture Temperature: No Abnormality No Abnormalities Noted: No Dry / Scaly: No Maceration: No Moist: Yes Wound Preparation Ulcer Cleansing: Rinsed/Irrigated with Saline Topical Anesthetic Applied: Other: lidocaine 4%, Treatment Notes Wound #2 (Right, Posterior Lower Leg) 1. Cleansed with: Clean wound with Normal Saline 2. Anesthetic Topical Lidocaine 4% cream to wound bed prior to debridement 4. Dressing Applied: Contact layer 5. Secondary Dressing Applied TARVIS, MANGELS ([id number]) ABD  and Kerlix/Conform 7. Secured with Tape Tubigrip Electronic Signature(s) Signed: 12/17/2014 10:41:14 AM By: Regan Lemming BSN, RN Entered By: Regan Lemming on 12/17/2014 10:41:14 [name] [name] ([id number]) -------------------------------------------------------------------------------- Vitals Details JESAIAH, TOMME 12/17/2014 10:15 Patient Name: Date of Service: P. AM Medical Record Patient Account Number: [id number] Number: Treating RN: Montey Hora Date of Birth/Sex: [birth year] (69 y.o. Male) Other Clinician: Primary Care Physician: Lamonte Sakai Treating Christin Fudge Referring Physician: Lamonte Sakai Physician/Extender: Suella Grove in Treatment: 0 Vital Signs Time Taken: 10:22 Temperature (F): 97.8 Height (in): 65 Pulse (bpm): 83 Source: Stated Respiratory Rate (breaths/min): 20 Weight (lbs): 218 Blood Pressure (mmHg): 112/76 Source: Stated Reference Range: 80 - 120 mg / dl Body Mass Index (BMI): 36.3 Electronic Signature(s) Signed: 12/17/2014 4:40:04 PM By: Montey Hora Entered By: Montey Hora on 12/17/2014 10:23:38

## 2014-12-24 ENCOUNTER — Encounter: Payer: Medicare HMO | Attending: Surgery | Admitting: Surgery

## 2014-12-24 DIAGNOSIS — I10 Essential (primary) hypertension: Secondary | ICD-10-CM | POA: Insufficient documentation

## 2014-12-24 DIAGNOSIS — S81811A Laceration without foreign body, right lower leg, initial encounter: Secondary | ICD-10-CM | POA: Insufficient documentation

## 2014-12-24 DIAGNOSIS — J449 Chronic obstructive pulmonary disease, unspecified: Secondary | ICD-10-CM | POA: Insufficient documentation

## 2014-12-24 DIAGNOSIS — W540XXA Bitten by dog, initial encounter: Secondary | ICD-10-CM | POA: Diagnosis not present

## 2014-12-24 DIAGNOSIS — I2510 Atherosclerotic heart disease of native coronary artery without angina pectoris: Secondary | ICD-10-CM | POA: Diagnosis not present

## 2014-12-24 DIAGNOSIS — L97212 Non-pressure chronic ulcer of right calf with fat layer exposed: Secondary | ICD-10-CM | POA: Diagnosis present

## 2014-12-24 DIAGNOSIS — N289 Disorder of kidney and ureter, unspecified: Secondary | ICD-10-CM | POA: Insufficient documentation

## 2014-12-24 DIAGNOSIS — E11622 Type 2 diabetes mellitus with other skin ulcer: Secondary | ICD-10-CM | POA: Insufficient documentation

## 2014-12-24 NOTE — Progress Notes (Signed)
[name] [name], [name] [name] ([id number]) Visit Report for 12/24/2014 Arrival Information Details Patient Name: [name] [name], [name] [name]. Date of Service: 12/24/2014 10:15 AM Medical Record Number: [id number] Patient Account Number: [id number] Date of Birth/Sex: [birth year] (69 y.o. Male) Treating RN: Cornell Barman Primary Care Physician: Lamonte Sakai Other Clinician: Referring Physician: Lamonte Sakai Treating Physician/Extender: Frann Rider in Treatment: 1 Visit Information History Since Last Visit Added or deleted any medications: No Patient Arrived: [name] [name] Any new allergies or adverse reactions: No Arrival Time: 10:24 Had a fall or experienced change in No Accompanied By: self activities of daily living that may affect Transfer Assistance: None risk of falls: Patient Identification Verified: Yes Signs or symptoms of abuse/neglect since last No Secondary Verification Process Yes visito Completed: Hospitalized since last visit: No Patient Has Alerts: Yes Has Dressing in Place as Prescribed: Yes Patient Alerts: DMII Pain Present Now: No aspirin 81 ABI L 1.25 NO ABI R R/T wound Electronic Signature(s) Signed: 12/24/2014 1:30:14 PM By: Gretta Cool, RN, BSN, Kim RN, BSN Entered By: Gretta Cool, RN, BSN, Kim on 12/24/2014 10:24:50 [name] [name] ([id number]) -------------------------------------------------------------------------------- Clinic Level of Care Assessment Details Patient Name: [name] [name]. Date of Service: 12/24/2014 10:15 AM Medical Record Number: [id number] Patient Account Number: [id number] Date of Birth/Sex: Apr 19, 1946 (69 y.o. Male) Treating RN: Cornell Barman Primary Care Physician: Lamonte Sakai Other Clinician: Referring Physician: Lamonte Sakai Treating Physician/Extender: Frann Rider in Treatment: 1 Clinic Level of Care Assessment Items TOOL 4 Quantity Score []  - Use when only an EandM is performed on FOLLOW-UP visit 0 ASSESSMENTS - Nursing Assessment  / Reassessment []  - Reassessment of Co-morbidities (includes updates in patient status) 0 X - Reassessment of Adherence to Treatment Plan 1 5 ASSESSMENTS - Wound and Skin Assessment / Reassessment X - Simple Wound Assessment / Reassessment - one wound 1 5 []  - Complex Wound Assessment / Reassessment - multiple wounds 0 []  - Dermatologic / Skin Assessment (not related to wound area) 0 ASSESSMENTS - Focused Assessment []  - Circumferential Edema Measurements - multi extremities 0 []  - Nutritional Assessment / Counseling / Intervention 0 []  - Lower Extremity Assessment (monofilament, tuning fork, pulses) 0 []  - Peripheral Arterial Disease Assessment (using hand held doppler) 0 ASSESSMENTS - Ostomy and/or Continence Assessment and Care []  - Incontinence Assessment and Management 0 []  - Ostomy Care Assessment and Management (repouching, etc.) 0 PROCESS - Coordination of Care X - Simple Patient / Family Education for ongoing care 1 15 []  - Complex (extensive) Patient / Family Education for ongoing care 0 []  - Staff obtains Programmer, systems, Records, Test Results / Process Orders 0 []  - Staff telephones HHA, Nursing Homes / Clarify orders / etc 0 []  - Routine Transfer to another Facility (non-emergent condition) 0 [name] [name], [name] [name] ([id number]) []  - Routine Hospital Admission (non-emergent condition) 0 []  - New Admissions / Biomedical engineer / Ordering NPWT, Apligraf, etc. 0 []  - Emergency Hospital Admission (emergent condition) 0 X - Simple Discharge Coordination 1 10 []  - Complex (extensive) Discharge Coordination 0 PROCESS - Special Needs []  - Pediatric / Minor Patient Management 0 []  - Isolation Patient Management 0 []  - Hearing / Language / Visual special needs 0 []  - Assessment of Community assistance (transportation, D/C planning, etc.) 0 []  - Additional assistance / Altered mentation 0 []  - Support Surface(s) Assessment (bed, cushion, seat, etc.) 0 INTERVENTIONS - Wound Cleansing  / Measurement []  - Simple Wound Cleansing - one wound 0 X - Complex Wound Cleansing - multiple wounds 2 5 X -  Wound Imaging (photographs - any number of wounds) 1 5 []  - Wound Tracing (instead of photographs) 0 []  - Simple Wound Measurement - one wound 0 X - Complex Wound Measurement - multiple wounds 2 5 INTERVENTIONS - Wound Dressings []  - Small Wound Dressing one or multiple wounds 0 X - Medium Wound Dressing one or multiple wounds 2 15 []  - Large Wound Dressing one or multiple wounds 0 []  - Application of Medications - topical 0 []  - Application of Medications - injection 0 INTERVENTIONS - Miscellaneous []  - External ear exam 0 [name] [name], [name] [name] ([id number]) []  - Specimen Collection (cultures, biopsies, blood, body fluids, etc.) 0 []  - Specimen(s) / Culture(s) sent or taken to Lab for analysis 0 []  - Patient Transfer (multiple staff / Civil Service fast streamer / Similar devices) 0 []  - Simple Staple / Suture removal (25 or less) 0 []  - Complex Staple / Suture removal (26 or more) 0 []  - Hypo / Hyperglycemic Management (close monitor of Blood Glucose) 0 []  - Ankle / Brachial Index (ABI) - do not check if billed separately 0 X - Vital Signs 1 5 Has the patient been seen at the hospital within the last three years: Yes Total Score: 95 Level Of Care: New/Established - Level 3 Electronic Signature(s) Signed: 12/24/2014 1:30:14 PM By: Gretta Cool, RN, BSN, Kim RN, BSN Entered By: Gretta Cool, RN, BSN, Kim on 12/24/2014 10:42:56 [name] [name] ([id number]) -------------------------------------------------------------------------------- Encounter Discharge Information Details Patient Name: [name] [name]. Date of Service: 12/24/2014 10:15 AM Medical Record Number: [id number] Patient Account Number: [id number] Date of Birth/Sex: [birth year] (69 y.o. Male) Treating RN: Cornell Barman Primary Care Physician: Lamonte Sakai Other Clinician: Referring Physician: Lamonte Sakai Treating Physician/Extender:  Frann Rider in Treatment: 1 Encounter Discharge Information Items Discharge Pain Level: 0 Discharge Condition: Stable Ambulatory Status: Cane Discharge Destination: Home Transportation: Other Accompanied By: self Schedule Follow-up Appointment: Yes Medication Reconciliation completed Yes and provided to Patient/Care [name] [name]: Provided on Clinical Summary of Care: 12/24/2014 Form Type Recipient Paper Patient WB Electronic Signature(s) Signed: 12/24/2014 10:50:23 AM By: Ruthine Dose Entered By: Ruthine Dose on 12/24/2014 10:50:23 [name] [name] ([id number]) -------------------------------------------------------------------------------- Lower Extremity Assessment Details Patient Name: [name] [name]. Date of Service: 12/24/2014 10:15 AM Medical Record Number: [id number] Patient Account Number: [id number] Date of Birth/Sex: [birth year] (69 y.o. Male) Treating RN: Cornell Barman Primary Care Physician: Lamonte Sakai Other Clinician: Referring Physician: Lamonte Sakai Treating Physician/Extender: Frann Rider in Treatment: 1 Edema Assessment Assessed: [Left: No] [Right: No] E[Left: dema] [Right: :] Calf Left: Right: Point of Measurement: 32 cm From Medial Instep cm 39.9 cm Ankle Left: Right: Point of Measurement: 10 cm From Medial Instep cm 22.7 cm Vascular Assessment Pulses: Posterior Tibial Dorsalis Pedis Palpable: [Right:Yes] Extremity colors, hair growth, and conditions: Extremity Color: [Right:Red] Hair Growth on Extremity: [Right:Yes] Temperature of Extremity: [Right:Warm] Capillary Refill: [Right:< 3 seconds] Toe Nail Assessment Left: Right: Thick: No Discolored: Yes Deformed: No Improper Length and Hygiene: No Electronic Signature(s) Signed: 12/24/2014 1:30:14 PM By: Gretta Cool, RN, BSN, Kim RN, BSN Entered By: Gretta Cool, RN, BSN, Kim on 12/24/2014 10:29:04 [name] [name]  ([id number]) -------------------------------------------------------------------------------- Multi Wound Chart Details Patient Name: [name] [name]. Date of Service: 12/24/2014 10:15 AM Medical Record Number: [id number] Patient Account Number: [id number] Date of Birth/Sex: Mar 23, 1946 (69 y.o. 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] [2:the Lower Extremity] [N/A:N/A] Date Acquired: [1:12/16/2014] [2:12/16/2014] [N/A:N/A] Weeks of Treatment: [1:1] [2:1] [N/A:N/A] Wound Status: [1:Open] [2:Open] [N/A:N/A] Measurements L x W x D 10x12x0.2 [2:6x7x0.2] [N/A:N/A] (cm) Area (cm) : [1:94.248] [2:32.987] [N/A:N/A] Volume (cm) : [1:18.85] [id number] [N/A:N/A] % Reduction in Area: [1:-166.70%] [2:-4102.20%] [N/A:N/A] % Reduction in Volume: -166.70% [2:-4101.90%] [N/A:N/A] Classification: [1:Full Thickness Without Exposed Support Structures] [2:Full Thickness Without Exposed Support Structures] [N/A:N/A] Periwound Skin Texture: No Abnormalities Noted No Abnormalities Noted [N/A:N/A] Periwound Skin [1:No Abnormalities Noted No Abnormalities Noted] [N/A:N/A] Moisture: Periwound Skin Color: No Abnormalities Noted No Abnormalities Noted [N/A:N/A] Tenderness on [1:No] [2:No] [N/A:N/A] Treatment Notes Electronic Signature(s) [name] [name], [name] [name] ([id number]) Signed: 12/24/2014 1:30:14 PM By: Gretta Cool, RN, BSN, Kim RN, BSN Entered By: Gretta Cool, RN, BSN, Kim on 12/24/2014 10:35:33 [name] [name] ([id number]) -------------------------------------------------------------------------------- [place] Details Patient Name: [name] [name], [name] [name]. Date of Service: 12/24/2014 10:15 AM Medical Record Number: [id number] Patient Account Number: [id number] Date of Birth/Sex: January 18, 1946 (69 y.o. Male) Treating RN: Cornell Barman Primary Care Physician: Lamonte Sakai Other Clinician: Referring Physician: Lamonte Sakai Treating Physician/Extender: Frann Rider in Treatment: 1 Active Inactive Abuse / Safety / Falls / Self Care Management Nursing Diagnoses: Potential for falls Goals: Patient will remain injury free Date Initiated: 12/17/2014 Goal Status: Active Interventions: Assess fall risk on admission and as needed Notes: Orientation to the Wound Care Program Nursing Diagnoses: Knowledge deficit related to the wound healing center program Goals: Patient/caregiver will verbalize understanding of the [facility] Date Initiated: 12/17/2014 Goal Status: Active Interventions: Provide education on orientation to the wound center Notes: Wound/Skin Impairment Nursing Diagnoses: Impaired tissue integrity Goals: Ulcer/skin breakdown will have a volume reduction of 30% by week 4 Date Initiated: 12/17/2014 [name] [name], [name] [name] ([id number]) Goal Status: Active Ulcer/skin breakdown will have a volume reduction of 50% by week 8 Date Initiated: 12/17/2014 Goal Status: Active Ulcer/skin breakdown will have a volume reduction of 80% by week 12 Date Initiated: 12/17/2014 Goal Status: Active Ulcer/skin breakdown will heal within 14 weeks Date Initiated: 12/17/2014 Goal Status: Active Interventions: Assess ulceration(s) every visit Notes: Electronic Signature(s) Signed: 12/24/2014 1:30:14 PM By: Gretta Cool, RN,  BSN, Kim RN, BSN Entered By: Gretta Cool, RN, BSN, Kim on 12/24/2014 10:35:27 [name] [name] ([id number]) -------------------------------------------------------------------------------- Patient/Caregiver Education Details Patient Name: [name] [name]. Date of Service: 12/24/2014 10:15 AM Medical Record Number: [id number] Patient Account Number: [id number] Date of Birth/Gender: Jul 15, 1945 (69 y.o. Male) Treating RN: Cornell Barman Primary Care Physician: Lamonte Sakai Other Clinician: Referring Physician: Lamonte Sakai Treating Physician/Extender: Frann Rider in Treatment: 1 Education Assessment Education Provided To: Patient Education Topics Provided Wound/Skin Impairment: Handouts: Caring for Your Ulcer, Other: continue wound care as prescribed Methods: Demonstration, Explain/Verbal Responses: State content correctly Electronic Signature(s) Signed: 12/24/2014 1:30:14 PM By: Gretta Cool, RN, BSN, Kim RN, BSN Entered By: Gretta Cool, RN, BSN, Kim on 12/24/2014 10:44:18 [name] [name] ([id number]) -------------------------------------------------------------------------------- Wound Assessment Details Patient Name: [name] [name]. Date of Service: 12/24/2014 10:15 AM Medical Record Number: [id number] Patient Account Number: [id number] Date of Birth/Sex: [birth year] (69 y.o. 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 Wound Measurements Length:  (cm) 10 Width: (cm) 12 Depth: (cm) 0.2 Area: (cm) 94.248 Volume: (cm) 18.85 % Reduction in Area: -166.7% % Reduction in Volume: -166.7% Wound Description Full Thickness Without Exposed Classification: Support Structures Periwound Skin Texture Texture Color No Abnormalities Noted: No No Abnormalities Noted: No Moisture No Abnormalities Noted: No Treatment Notes Wound #1 (Right, Anterior Lower Leg) 1. Cleansed with: Clean wound with Normal Saline 2. Anesthetic Topical Lidocaine 4% cream to wound bed prior to debridement 4. Dressing Applied: Petroleum gauze 5. Secondary Dressing Applied ABD and Kerlix/Conform [name] [name], [name] [name]. ([id number]) 7. Secured with Financial risk analyst) Signed: 12/24/2014 1:30:14 PM By: Gretta Cool, RN, BSN, Kim RN, BSN Entered By: Gretta Cool, RN, BSN, Kim on 12/24/2014 10:34:40 [name] [name] ([id number]) -------------------------------------------------------------------------------- Wound Assessment Details Patient Name: [name] [name], [name] [name]. Date of Service: 12/24/2014 10:15 AM Medical Record Number: [id number] Patient Account Number: [id number] Date of Birth/Sex: 1945/11/27 (69 y.o. 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 1 stitch sutured Clustered Wound: No in place by ER MD. Wound Measurements Length: (cm) 6 Width: (cm) 7 Depth: (cm) 0.2 Area: (cm) 32.987 Volume: (cm) 6.597 % Reduction in Area: -4102.2% % Reduction in Volume: -4101.9% Wound Description Full Thickness Without  Exposed Classification: Support Structures Periwound Skin Texture Texture Color No Abnormalities Noted: No No Abnormalities Noted: No Moisture No Abnormalities Noted: No Treatment Notes Wound #2 (Right, Posterior Lower Leg) 1. Cleansed with: Clean wound with Normal Saline 2. Anesthetic Topical Lidocaine 4% cream to wound bed prior to debridement 4. Dressing Applied: Petroleum gauze 5. Secondary Dressing Applied ABD and Kerlix/Conform 7. Secured with [employer] [name] [name], [name] [name] ([id number]) Electronic Signature(s) Signed: 12/24/2014 1:30:14 PM By: Gretta Cool, RN, BSN, Kim RN, BSN Entered By: Gretta Cool, RN, BSN, Kim on 12/24/2014 10:34:40 [name] [name] ([id number]) -------------------------------------------------------------------------------- Vitals Details Patient Name: [name] [name]. Date of Service: 12/24/2014 10:15 AM Medical Record Number: [id number] Patient Account Number: [id number] Date of Birth/Sex: 1945/09/24 (69 y.o. Male) Treating RN: Cornell Barman Primary Care Physician: Lamonte Sakai Other Clinician: Referring Physician: Lamonte Sakai Treating Physician/Extender: Frann Rider in Treatment: 1 Vital Signs Time Taken: 10:25 Temperature (F): 98.0 Height (in): 65 Pulse (bpm): 80 Weight (lbs): 218 Respiratory Rate (breaths/min): 20 Body Mass Index (BMI): 36.3 Blood Pressure (mmHg): 133/100 Reference Range: 80 - 120 mg / dl Electronic Signature(s) Signed: 12/24/2014 1:30:14 PM By: Gretta Cool, RN, BSN, Kim RN, BSN Entered By: Gretta Cool, RN, BSN, Kim on 12/24/2014 10:25:44

## 2014-12-25 NOTE — Progress Notes (Signed)
RIDGE, DUMOND ([id number]) Visit Report for 12/24/2014 Chief Complaint Document Details ODAI, HUTZELL 12/24/2014 10:15 Patient Name: Date of Service: P. AM Medical Record Patient Account Number: [id number] Number: Treating RN: Cornell Barman Date of Birth/Sex: 08-Jun-1945 (69 y.o. Male) Other Clinician: Primary Care Physician: Lamonte Sakai Treating Christin Fudge Referring Physician: Lamonte Sakai Physician/Extender: Suella Grove in Treatment: 1 Information Obtained from: Patient Chief Complaint Patient presents to the wound care center for a consult due non healing wound. He sustained a dog bite yesterday and went to the ER where first aid and suturing was done. Electronic Signature(s) Signed: 12/24/2014 10:44:47 AM By: Christin Fudge MD, FACS Entered By: Christin Fudge on 12/24/2014 10:44:47 [name] ([id number]) -------------------------------------------------------------------------------- HPI Details LANDRY, SCHWINN 12/24/2014 10:15 Patient Name: Date of Service: P. AM Medical Record Patient Account Number: [id number] Number: Treating RN: Cornell Barman Date of Birth/Sex: 02-06-1946 (69 y.o. Male) Other Clinician: Primary Care Physician: Lamonte Sakai Treating Christin Fudge Referring Physician: Lamonte Sakai Physician/Extender: Suella Grove in Treatment: 1 History of Present Illness Location: right lower extremity Quality: Patient reports experiencing a sharp pain to affected area(s). Severity: Patient states wound (s) are getting better. Duration: Patient states the wound has been present for ____1_days. Timing: Pain in wound is constant (hurts all the time) Context: The wound occurred when the patient was bitten by a dog yesterday morning Modifying Factors: Consults to this date include:was seen in the ED and given IV antibiotics and suturing was done. Associated Signs and Symptoms: Patient reports presence of swelling HPI Description:  69 year old gentleman was seen in the ED on 12/16/2014 with a dog bite. No notes are available on Epic. Past medical history significant for essential hypertension, COPD, sleep apnea, nephrolithiasis, hyperlipidemia, obesity, diabetes mellitus, coronary artery disease with stable angina. I understand he had IV antibiotics, a CT scan with the head was done because of a fall and this was normal and a x-ray of the leg was done which showed no abnormality. Due to kidney disease his kidney doctor has taken him off his metformin and the patient's blood sugars have been running normal and his last hemoglobin A1c was 6 12/24/2014 -- the patient has had some redness of his lower limb but no fever or change in his diabetic control. He also has a blister come near the area where he had previous dressing applied. Electronic Signature(s) Signed: 12/24/2014 10:45:20 AM By: Christin Fudge MD, FACS Entered By: Christin Fudge on 12/24/2014 10:45:19 [name] ([id number]) -------------------------------------------------------------------------------- Physical Exam Details LEODAN, GERARD 12/24/2014 10:15 Patient Name: Date of Service: P. AM Medical Record Patient Account Number: [id number] Number: Treating RN: Cornell Barman Date of Birth/Sex: July 23, 1945 (69 y.o. Male) Other Clinician: Primary Care Physician: Lamonte Sakai Treating Christin Fudge Referring Physician: Lamonte Sakai Physician/Extender: Suella Grove in Treatment: 1 Constitutional . Pulse regular. Respirations normal and unlabored. Afebrile. . Eyes Nonicteric. Reactive to light. Ears, Nose, Mouth, and Throat Lips, teeth, and gums WNL.Marland Kitchen Moist mucosa without lesions . Neck supple and nontender. No palpable supraclavicular or cervical adenopathy. Normal sized without goiter. Respiratory WNL. No retractions.. Cardiovascular Pedal Pulses WNL. No clubbing, cyanosis or edema. Lymphatic No adneopathy. No adenopathy. No  adenopathy. Musculoskeletal Adexa without tenderness or enlargement.. Digits and nails w/o clubbing, cyanosis, infection, petechiae, ischemia, or inflammatory conditions.. Integumentary (Hair, Skin) No suspicious lesions. No crepitus or fluctuance. No peri-wound warmth or erythema. No masses.Marland Kitchen Psychiatric Judgement and insight Intact.. No evidence of depression, anxiety, or agitation.. Notes The sutures are still intact and there is mild  cellulitis around the actual suture site. There are several areas where he has dry eschar and there is a superficial blister which does not look significant. Electronic Signature(s) Signed: 12/24/2014 10:46:03 AM By: Christin Fudge MD, FACS Entered By: Christin Fudge on 12/24/2014 10:46:02 [name] ([id number]) -------------------------------------------------------------------------------- Physician Orders Details ROLLAND, BREDAHL 12/24/2014 10:15 Patient Name: Date of Service: P. AM Medical Record Patient Account Number: [id number] Number: Treating RN: Cornell Barman Date of Birth/Sex: 1945/10/08 (69 y.o. Male) Other Clinician: Primary Care Physician: Lamonte Sakai Treating Christin Fudge Referring Physician: Lamonte Sakai Physician/Extender: Suella Grove in Treatment: 1 Verbal / Phone Orders: Yes Clinician: Cornell Barman Read Back and Verified: Yes Diagnosis Coding Wound Cleansing Wound #1 Right,Anterior Lower Leg o Clean wound with Normal Saline. o May Shower, gently pat wound dry prior to applying new dressing. Wound #2 Right,Posterior Lower Leg o Clean wound with Normal Saline. o May Shower, gently pat wound dry prior to applying new dressing. Anesthetic Wound #1 Right,Anterior Lower Leg o Topical Lidocaine 4% cream applied to wound bed prior to debridement Wound #2 Right,Posterior Lower Leg o Topical Lidocaine 4% cream applied to wound bed prior to debridement Primary Wound Dressing Wound #1 Right,Anterior Lower  Leg o Contact layer Wound #2 Right,Posterior Lower Leg o Contact layer Secondary Dressing Wound #1 Right,Anterior Lower Leg o ABD and Kerlix/Conform Wound #2 Right,Posterior Lower Leg o ABD and Kerlix/Conform Dressing Change Frequency Wound #1 Right,Anterior Lower Leg o Change dressing every day. KOVIN, DEMMA ([id number]) Wound #2 Right,Posterior Lower Leg o Change dressing every day. Follow-up Appointments Wound #1 Right,Anterior Lower Leg o Return Appointment in 1 week. Wound #2 Right,Posterior Lower Leg o Return Appointment in 1 week. Edema Control Wound #1 Right,Anterior Lower Leg o Elevate legs to the level of the heart and pump ankles as often as possible Wound #2 Right,Posterior Lower Leg o Elevate legs to the level of the heart and pump ankles as often as possible Additional Orders / Instructions Wound #1 Right,Anterior Lower Leg o Activity as tolerated Wound #2 Right,Posterior Lower Leg o Activity as tolerated Medications-please add to medication list. Wound #1 Right,Anterior Lower Leg o P.O. Antibiotics - continue antibiotics Wound #2 Right,Posterior Lower Leg o P.O. Antibiotics - continue antibiotics Electronic Signature(s) Signed: 12/24/2014 1:30:14 PM By: Gretta Cool RN, BSN, Kim RN, BSN Signed: 12/24/2014 4:57:18 PM By: Christin Fudge MD, FACS Entered By: Gretta Cool RN, BSN, Kim on 12/24/2014 10:42:11 CHARLIES, SIEMINSKI ([id number]) -------------------------------------------------------------------------------- Problem List Details KURAN, SAUPE 12/24/2014 10:15 Patient Name: Date of Service: P. AM Medical Record Patient Account Number: [id number] Number: Treating RN: Cornell Barman Date of Birth/Sex: 13-Sep-1945 (69 y.o. Male) Other Clinician: Primary Care Physician: Lamonte Sakai Treating Christin Fudge Referring Physician: Lamonte Sakai Physician/Extender: Suella Grove in Treatment: 1 Active  Problems ICD-10 Encounter Code Description Active Date Diagnosis E11.621 Type 2 diabetes mellitus with foot ulcer 12/17/2014 Yes W54.0XXA Bitten by dog, initial encounter 12/17/2014 Yes S81.811A Laceration without foreign body, right lower leg, initial 12/17/2014 Yes encounter L97.212 Non-pressure chronic ulcer of right calf with fat layer 12/17/2014 Yes exposed Inactive Problems Resolved Problems Electronic Signature(s) Signed: 12/24/2014 10:44:37 AM By: Christin Fudge MD, FACS Entered By: Christin Fudge on 12/24/2014 10:44:36 [name] ([id number]) -------------------------------------------------------------------------------- Progress Note Details NASR, DOOMS 12/24/2014 10:15 Patient Name: Date of Service: P. AM Medical Record Patient Account Number: [id number] Number: Treating RN: Cornell Barman Date of Birth/Sex: 1945-06-01 (69 y.o. Male) Other Clinician: Primary Care Physician: Lamonte Sakai Treating Christin Fudge Referring Physician: Lamonte Sakai Physician/Extender: Suella Grove  in Treatment: 1 Subjective Chief Complaint Information obtained from Patient Patient presents to the wound care center for a consult due non healing wound. He sustained a dog bite yesterday and went to the ER where first aid and suturing was done. History of Present Illness (HPI) The following HPI elements were documented for the patient's wound: Location: right lower extremity Quality: Patient reports experiencing a sharp pain to affected area(s). Severity: Patient states wound (s) are getting better. Duration: Patient states the wound has been present for ____1_days. Timing: Pain in wound is constant (hurts all the time) Context: The wound occurred when the patient was bitten by a dog yesterday morning Modifying Factors: Consults to this date include:was seen in the ED and given IV antibiotics and suturing was done. Associated Signs and Symptoms: Patient reports presence of  swelling 69 year old gentleman was seen in the ED on 12/16/2014 with a dog bite. No notes are available on Epic. Past medical history significant for essential hypertension, COPD, sleep apnea, nephrolithiasis, hyperlipidemia, obesity, diabetes mellitus, coronary artery disease with stable angina. I understand he had IV antibiotics, a CT scan with the head was done because of a fall and this was normal and a x-ray of the leg was done which showed no abnormality. Due to kidney disease his kidney doctor has taken him off his metformin and the patient's blood sugars have been running normal and his last hemoglobin A1c was 6 12/24/2014 -- the patient has had some redness of his lower limb but no fever or change in his diabetic control. He also has a blister come near the area where he had previous dressing applied. Objective Constitutional GIUSEPPI, LYDY. ([id number]) Pulse regular. Respirations normal and unlabored. Afebrile. Vitals Time Taken: 10:25 AM, Height: 65 in, Weight: 218 lbs, BMI: 36.3, Temperature: 98.0 F, Pulse: 80 bpm, Respiratory Rate: 20 breaths/min, Blood Pressure: 133/100 mmHg. Eyes Nonicteric. Reactive to light. Ears, Nose, Mouth, and Throat Lips, teeth, and gums WNL.Marland Kitchen Moist mucosa without lesions . Neck supple and nontender. No palpable supraclavicular or cervical adenopathy. Normal sized without goiter. Respiratory WNL. No retractions.. Cardiovascular Pedal Pulses WNL. No clubbing, cyanosis or edema. Lymphatic No adneopathy. No adenopathy. No adenopathy. Musculoskeletal Adexa without tenderness or enlargement.. Digits and nails w/o clubbing, cyanosis, infection, petechiae, ischemia, or inflammatory conditions.Marland Kitchen Psychiatric Judgement and insight Intact.. No evidence of depression, anxiety, or agitation.. General Notes: The sutures are still intact and there is mild cellulitis around the actual suture site. There are several areas where he has dry eschar and  there is a superficial blister which does not look significant. Integumentary (Hair, Skin) No suspicious lesions. No crepitus or fluctuance. No peri-wound warmth or erythema. No masses.. Wound #1 status is Open. Original cause of wound was Trauma. The wound is located on the Right,Anterior Lower Leg. The wound measures 10cm length x 12cm width x 0.2cm depth; 94.248cm^2 area and 18.85cm^3 volume. Wound #2 status is Open. Original cause of wound was Trauma. The wound is located on the Right,Posterior Lower Leg. The wound measures 6cm length x 7cm width x 0.2cm depth; 32.987cm^2 area and 6.597cm^3 volume. AQIB, STOCK ([id number]) Assessment Active Problems ICD-10 E11.621 - Type 2 diabetes mellitus with foot ulcer W54.0XXA - Bitten by dog, initial encounter [phone] - Laceration without foreign body, right lower leg, initial encounter [phone] - Non-pressure chronic ulcer of right calf with fat layer exposed I have recommended continuing with symptomatic treatment and his antibiotic as prescribed by the ED. I have recommended the wound be  covered with Adaptic and a ABD pad with a light Kerlix dressing and have asked him to keep it as dry as possible and see me back next week. He has also been educated that if the redness, cellulitis or he develops a fever he should report to the ED immediately. Other than that he will come back and see me next week. Plan Wound Cleansing: Wound #1 Right,Anterior Lower Leg: Clean wound with Normal Saline. May Shower, gently pat wound dry prior to applying new dressing. Wound #2 Right,Posterior Lower Leg: Clean wound with Normal Saline. May Shower, gently pat wound dry prior to applying new dressing. Anesthetic: Wound #1 Right,Anterior Lower Leg: Topical Lidocaine 4% cream applied to wound bed prior to debridement Wound #2 Right,Posterior Lower Leg: Topical Lidocaine 4% cream applied to wound bed prior to debridement Primary Wound  Dressing: Wound #1 Right,Anterior Lower Leg: Contact layer Wound #2 Right,Posterior Lower Leg: Contact layer Secondary Dressing: Wound #1 Right,Anterior Lower Leg: ABD and Kerlix/Conform Wound #2 Right,Posterior Lower Leg: ABD and Kerlix/Conform Dressing Change Frequency: Wound #1 Right,Anterior Lower Leg: Change dressing every day. Wound #2 Right,Posterior Lower Leg: JAYMISON, DENSMORE ([id number]) Change dressing every day. Follow-up Appointments: Wound #1 Right,Anterior Lower Leg: Return Appointment in 1 week. Wound #2 Right,Posterior Lower Leg: Return Appointment in 1 week. Edema Control: Wound #1 Right,Anterior Lower Leg: Elevate legs to the level of the heart and pump ankles as often as possible Wound #2 Right,Posterior Lower Leg: Elevate legs to the level of the heart and pump ankles as often as possible Additional Orders / Instructions: Wound #1 Right,Anterior Lower Leg: Activity as tolerated Wound #2 Right,Posterior Lower Leg: Activity as tolerated Medications-please add to medication list.: Wound #1 Right,Anterior Lower Leg: P.O. Antibiotics - continue antibiotics Wound #2 Right,Posterior Lower Leg: P.O. Antibiotics - continue antibiotics I have recommended continuing with symptomatic treatment and his antibiotic as prescribed by the ED. I have recommended the wound be covered with Adaptic and a ABD pad with a light Kerlix dressing and have asked him to keep it as dry as possible and see me back next week. He has also been educated that if the redness, cellulitis or he develops a fever he should report to the ED immediately. Other than that he will come back and see me next week. Electronic Signature(s) Signed: 12/24/2014 10:48:14 AM By: Christin Fudge MD, FACS Entered By: Christin Fudge on 12/24/2014 10:48:13 [name] ([id number]) -------------------------------------------------------------------------------- SuperBill Details Patient Name:  [name]. Date of Service: 12/24/2014 Medical Record Number: [id number] Patient Account Number: [id number] Date of Birth/Sex: [birth year] (69 y.o. Male) Treating RN: Cornell Barman Primary Care Physician: Lamonte Sakai Other Clinician: Referring Physician: Lamonte Sakai Treating Physician/Extender: Frann Rider in Treatment: 1 Diagnosis Coding ICD-10 Codes Code Description E11.621 Type 2 diabetes mellitus with foot ulcer W54.0XXA Bitten by dog, initial encounter [phone] Laceration without foreign body, right lower leg, initial encounter L97.212 Non-pressure chronic ulcer of right calf with fat layer exposed Facility Procedures CPT4 Code: [id number] Description: 99213 - WOUND CARE VISIT-LEV 3 EST PT Modifier: Quantity: 1 Physician Procedures CPT4 Code Description: [id number] - WC PHYS LEVEL 3 - EST PT ICD-10 Description Diagnosis E11.621 Type 2 diabetes mellitus with foot ulcer W54.0XXA Bitten by dog, initial encounter [id number] Laceration without foreign body, right lower leg, init  L97.212 Non-pressure chronic ulcer of right calf with fat laye Modifier: ial encounte r exposed Quantity: 1 r Electronic Signature(s) Signed: 12/24/2014 10:48:32 AM By: Christin Fudge MD, FACS Entered By: Con Memos  Miley Blanchett on 12/24/2014 10:48:32

## 2014-12-31 ENCOUNTER — Encounter: Payer: Medicare HMO | Admitting: Surgery

## 2014-12-31 DIAGNOSIS — L97212 Non-pressure chronic ulcer of right calf with fat layer exposed: Secondary | ICD-10-CM | POA: Diagnosis not present

## 2015-01-01 NOTE — Progress Notes (Signed)
[name], [name] ([id number]) Visit Report for 12/31/2014 Arrival Information Details Patient Name: [name] [name], [name] [name]. Date of Service: 12/31/2014 1:00 PM Medical Record Number: [id number] Patient Account Number: [id number] Date of Birth/Sex: [birth year] (69 y.o. Male) Treating RN: Montey Hora Primary Care Physician: Lamonte Sakai Other Clinician: Referring Physician: Lamonte Sakai Treating Physician/Extender: Frann Rider in Treatment: 2 Visit Information History Since Last Visit Added or deleted any medications: No Patient Arrived: Ambulatory Any new allergies or adverse reactions: No Arrival Time: 13:08 Had a fall or experienced change in No Accompanied By: self activities of daily living that may affect Transfer Assistance: None risk of falls: Patient Identification Verified: Yes Signs or symptoms of abuse/neglect since last No Secondary Verification Process Yes visito Completed: Hospitalized since last visit: No Patient Has Alerts: Yes Pain Present Now: No Patient Alerts: DMII aspirin 81 ABI L 1.25 NO ABI R R/T wound Electronic Signature(s) Signed: 12/31/2014 5:13:06 PM By: Montey Hora Entered By: Montey Hora on 12/31/2014 13:09:29 [name] [name] ([id number]) -------------------------------------------------------------------------------- Clinic Level of Care Assessment Details Patient Name: [name] [name]. Date of Service: 12/31/2014 1:00 PM Medical Record Number: [id number] Patient Account Number: [id number] Date of Birth/Sex: July 21, 1945 (69 y.o. Male) Treating RN: Montey Hora Primary Care Physician: Lamonte Sakai Other Clinician: Referring Physician: Lamonte Sakai Treating Physician/Extender: Frann Rider in Treatment: 2 Clinic Level of Care Assessment Items TOOL 4 Quantity Score []  - Use when only an EandM is performed on FOLLOW-UP visit 0 ASSESSMENTS - Nursing Assessment / Reassessment X - Reassessment of  Co-morbidities (includes updates in patient status) 1 10 X - Reassessment of Adherence to Treatment Plan 1 5 ASSESSMENTS - Wound and Skin Assessment / Reassessment []  - Simple Wound Assessment / Reassessment - one wound 0 X - Complex Wound Assessment / Reassessment - multiple wounds 2 5 []  - Dermatologic / Skin Assessment (not related to wound area) 0 ASSESSMENTS - Focused Assessment X - Circumferential Edema Measurements - multi extremities 1 5 []  - Nutritional Assessment / Counseling / Intervention 0 X - Lower Extremity Assessment (monofilament, tuning fork, pulses) 1 5 []  - Peripheral Arterial Disease Assessment (using hand held doppler) 0 ASSESSMENTS - Ostomy and/or Continence Assessment and Care []  - Incontinence Assessment and Management 0 []  - Ostomy Care Assessment and Management (repouching, etc.) 0 PROCESS - Coordination of Care X - Simple Patient / Family Education for ongoing care 1 15 []  - Complex (extensive) Patient / Family Education for ongoing care 0 []  - Staff obtains Programmer, systems, Records, Test Results / Process Orders 0 []  - Staff telephones HHA, Nursing Homes / Clarify orders / etc 0 []  - Routine Transfer to another Facility (non-emergent condition) 0 [name], [name] ([id number]) []  - Routine Hospital Admission (non-emergent condition) 0 []  - New Admissions / Biomedical engineer / Ordering NPWT, Apligraf, etc. 0 []  - Emergency Hospital Admission (emergent condition) 0 X - Simple Discharge Coordination 1 10 []  - Complex (extensive) Discharge Coordination 0 PROCESS - Special Needs []  - Pediatric / Minor Patient Management 0 []  - Isolation Patient Management 0 []  - Hearing / Language / Visual special needs 0 []  - Assessment of Community assistance (transportation, D/C planning, etc.) 0 []  - Additional assistance / Altered mentation 0 []  - Support Surface(s) Assessment (bed, cushion, seat, etc.) 0 INTERVENTIONS - Wound Cleansing / Measurement []  - Simple  Wound Cleansing - one wound 0 X - Complex Wound Cleansing - multiple wounds 2 5 X - Wound Imaging (photographs - any number of wounds) 1 5 []  -  Wound Tracing (instead of photographs) 0 []  - Simple Wound Measurement - one wound 0 X - Complex Wound Measurement - multiple wounds 2 5 INTERVENTIONS - Wound Dressings []  - Small Wound Dressing one or multiple wounds 0 X - Medium Wound Dressing one or multiple wounds 2 15 []  - Large Wound Dressing one or multiple wounds 0 []  - Application of Medications - topical 0 []  - Application of Medications - injection 0 INTERVENTIONS - Miscellaneous []  - External ear exam 0 [name], [name] ([id number]) []  - Specimen Collection (cultures, biopsies, blood, body fluids, etc.) 0 []  - Specimen(s) / Culture(s) sent or taken to Lab for analysis 0 []  - Patient Transfer (multiple staff / [name] / Similar devices) 0 []  - Simple Staple / Suture removal (25 or less) 0 []  - Complex Staple / Suture removal (26 or more) 0 []  - Hypo / Hyperglycemic Management (close monitor of Blood Glucose) 0 []  - Ankle / Brachial Index (ABI) - do not check if billed separately 0 X - Vital Signs 1 5 Has the patient been seen at the hospital within the last three years: Yes Total Score: 120 Level Of Care: New/Established - Level 4 Electronic Signature(s) Signed: 12/31/2014 5:13:06 PM By: Montey Hora Entered By: Montey Hora on 12/31/2014 13:29:04 [name] [name] ([id number]) -------------------------------------------------------------------------------- Encounter Discharge Information Details Patient Name: [name] [name]. Date of Service: 12/31/2014 1:00 PM Medical Record Number: [id number] Patient Account Number: [id number] Date of Birth/Sex: [birth year] (69 y.o. Male) Treating RN: Montey Hora Primary Care Physician: Lamonte Sakai Other Clinician: Referring Physician: Lamonte Sakai Treating Physician/Extender: Frann Rider in Treatment:  2 Encounter Discharge Information Items Discharge Pain Level: 0 Discharge Condition: Stable Ambulatory Status: Ambulatory Discharge Destination: Home Transportation: Private Auto Accompanied By: self Schedule Follow-up Appointment: Yes Medication Reconciliation completed and provided to Patient/Care No Ramya Vanbergen: Provided on Clinical Summary of Care: 12/31/2014 Form Type Recipient Paper Patient WB Electronic Signature(s) Signed: 12/31/2014 1:39:14 PM By: Ruthine Dose Entered By: Ruthine Dose on 12/31/2014 13:39:14 [name] [name] ([id number]) -------------------------------------------------------------------------------- Lower Extremity Assessment Details Patient Name: [name] [name]. Date of Service: 12/31/2014 1:00 PM Medical Record Number: [id number] Patient Account Number: [id number] Date of Birth/Sex: 09-20-1945 (69 y.o. Male) Treating RN: Montey Hora Primary Care Physician: Lamonte Sakai Other Clinician: Referring Physician: Lamonte Sakai Treating Physician/Extender: Frann Rider in Treatment: 2 Edema Assessment Assessed: [Left: No] [Right: No] Edema: [Left: Ye] [Right: s] Calf Left: Right: Point of Measurement: 32 cm From Medial Instep cm 39.6 cm Ankle Left: Right: Point of Measurement: 10 cm From Medial Instep cm 22.1 cm Vascular Assessment Pulses: Posterior Tibial Dorsalis Pedis Palpable: [Right:Yes] Extremity colors, hair growth, and conditions: Extremity Color: [Right:Red] Hair Growth on Extremity: [Right:Yes] Temperature of Extremity: [Right:Warm] Capillary Refill: [Right:< 3 seconds] Toe Nail Assessment Left: Right: Thick: Yes Discolored: No Deformed: No Improper Length and Hygiene: Yes Electronic Signature(s) Signed: 12/31/2014 5:13:06 PM By: Montey Hora Entered By: Montey Hora on 12/31/2014 13:15:14 [name] [name] ([id number]) -------------------------------------------------------------------------------- Multi  Wound Chart Details Patient Name: [name] [name]. Date of Service: 12/31/2014 1:00 PM Medical Record Number: [id number] Patient Account Number: [id number] Date of Birth/Sex: 1946-03-13 (69 y.o. 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] [2:the Lower Extremity] Comorbid History: [1:Chronic Obstructive Pulmonary Disease (COPD), Coronary Artery (COPD), Coronary Artery Disease, Hypertension, Type II Diabetes, Osteoarthritis, Neuropathy Osteoarthritis, Neuropathy] [2:Chronic Obstructive Pulmonary Disease Disease,  Hypertension, Type II Diabetes,] [N/A:N/A] Date Acquired: [1:12/16/2014] [2:12/16/2014] [N/A:N/A] Weeks of Treatment: [1:2] [2:2] [N/A:N/A] Wound Status: [1:Open] [2:Open] [N/A:N/A] Measurements L x W x D 10x11x0.2 [2:6x7x0.2] [N/A:N/A] (cm) Area (cm) : [1:86.394] [2:32.987] [N/A:N/A] Volume (cm) : [1:17.279] [2:6.597] [N/A:N/A] % Reduction in Area: [1:-144.40%] [2:-4102.20%] [N/A:N/A] % Reduction in Volume: -144.40% [2:-4101.90%] [N/A:N/A] Classification: [1:Full Thickness Without Exposed Support Structures] [2:Full Thickness Without Exposed Support Structures] [N/A:N/A] HBO Classification: [1:Grade 1] [2:Grade 1] [N/A:N/A] Exudate Amount: [1:Small] [2:Small] [N/A:N/A] Exudate Type: [1:Serous] [2:Serous]  [N/A:N/A] Exudate Color: [1:amber] [2:amber] [N/A:N/A] Wound Margin: [1:Flat and Intact] [2:Flat and Intact] [N/A:N/A] Granulation Amount: Medium (34-66%) Medium (34-66%) N/A Granulation Quality: Red, Pink Red N/A Necrotic Amount: Medium (34-66%) Medium (34-66%) N/A Necrotic Tissue: Eschar, Adherent Slough Eschar, Adherent Slough N/A Exposed Structures: Fascia: No Fascia: No N/A Fat: No Fat: No Tendon: No Tendon: No Muscle: No Muscle: No Joint: No Joint: No Bone: No Bone: No Limited to Skin Limited to Skin Breakdown Breakdown Epithelialization: None None N/A Periwound Skin Texture: Edema: No Edema: No N/A Excoriation: No Excoriation: No Induration: No Induration: No Callus: No Callus: No Crepitus: No Crepitus: No Fluctuance: No Fluctuance: No Friable: No Friable: No Rash: No Rash: No Scarring: No Scarring: No Periwound Skin Maceration: No Maceration: No N/A Moisture: Moist: No Moist: No Dry/Scaly: No Dry/Scaly: No Periwound Skin Color: Atrophie Blanche: No Atrophie Blanche: No N/A Cyanosis: No Cyanosis: No Ecchymosis: No Ecchymosis: No Erythema: No Erythema: No Hemosiderin Staining: No Hemosiderin Staining: No Mottled: No Mottled: No Pallor: No Pallor: No Rubor: No Rubor: No Temperature: No Abnormality No Abnormality N/A Tenderness on No No N/A Palpation: Wound Preparation: Ulcer Cleansing: Ulcer Cleansing: N/A Rinsed/Irrigated with Rinsed/Irrigated with Saline Saline Topical Anesthetic Topical Anesthetic Applied: None Applied: None Treatment Notes Electronic Signature(s) Signed: 12/31/2014 5:13:06 PM By: Montey Hora Entered By: Montey Hora on 12/31/2014 13:21:13 [name] [name] ([id number]) IGNATIUS, KIFER ([id number]) -------------------------------------------------------------------------------- Multi-Disciplinary Care Plan Details Patient Name: [name] [name]. Date of Service: 12/31/2014 1:00 PM Medical  Record Number: [id number] Patient Account Number: [id number] Date of Birth/Sex: 1945-05-12 (69 y.o. Male) Treating RN: Montey Hora Primary Care Physician: Lamonte Sakai Other Clinician: Referring Physician: Lamonte Sakai Treating Physician/Extender: Frann Rider in Treatment: 2 Active Inactive Abuse / Safety / Falls / Self Care Management Nursing Diagnoses: Potential for falls Goals: Patient will remain injury free Date Initiated: 12/17/2014 Goal Status: Active Interventions: Assess fall risk on admission and as needed Notes: Orientation to the Wound Care Program Nursing Diagnoses: Knowledge deficit related to the wound healing center program Goals: Patient/caregiver will verbalize understanding of the [facility] Date Initiated: 12/17/2014 Goal Status: Active Interventions: Provide education on orientation to the wound center Notes: Wound/Skin Impairment Nursing Diagnoses: Impaired tissue integrity Goals: Ulcer/skin breakdown will have a volume reduction of 30% by week 4 Date Initiated: 12/17/2014 DANIYAL, FELS ([id number]) Goal Status: Active Ulcer/skin breakdown will have a volume reduction of 50% by week 8 Date Initiated: 12/17/2014 Goal Status: Active Ulcer/skin breakdown will have a volume reduction of 80% by week 12 Date Initiated: 12/17/2014 Goal Status: Active Ulcer/skin breakdown will heal within 14 weeks Date Initiated: 12/17/2014  Goal Status: Active Interventions: Assess ulceration(s) every visit Notes: Electronic Signature(s) Signed: 12/31/2014 5:13:06 PM By: Montey Hora Entered By: Montey Hora on 12/31/2014 13:21:05 [name] [name] ([id number]) -------------------------------------------------------------------------------- Patient/Caregiver Education Details Patient Name: [name] [name]. Date of Service: 12/31/2014 1:00 PM Medical Record Number: [id number] Patient Account Number: [id number] Date of  Birth/Gender: [birth year] (69 y.o. Male) Treating RN: Montey Hora Primary Care Physician: Lamonte Sakai Other Clinician: Referring Physician: Lamonte Sakai Treating Physician/Extender: Frann Rider in Treatment: 2 Education Assessment Education Provided To: Patient Education Topics Provided Venous: Handouts: Other: sweling and the need for compression Methods: Demonstration, Explain/Verbal Responses: State content correctly Wound/Skin Impairment: Handouts: Other: wound care as ordered Methods: Demonstration, Explain/Verbal Responses: State content correctly Electronic Signature(s) Signed: 12/31/2014 5:13:06 PM By: Montey Hora Entered By: Montey Hora on 12/31/2014 13:25:51 [name] [name] ([id number]) -------------------------------------------------------------------------------- Wound Assessment Details Patient Name: [name] [name]. Date of Service: 12/31/2014 1:00 PM Medical Record Number: [id number] Patient Account Number: [id number] Date of Birth/Sex: [birth year] (69 y.o. Male) Treating RN: Montey Hora Primary Care Physician: Lamonte Sakai Other Clinician: Referring Physician: Lamonte Sakai Treating Physician/Extender: Frann Rider in Treatment: 2 Wound Status Wound Number: 1 Primary Trauma, Other Etiology: Wound Location: Right Lower Leg - Anterior Secondary Diabetic Wound/Ulcer of the Lower Wounding Event: Trauma Etiology: Extremity Date Acquired: 12/16/2014 Wound Open Weeks Of Treatment: 2 Status: Clustered Wound: No Notes: Patient bitten by a dog yesterday, went to the ER and 9 stitches used to suture skin flap together. Comorbid Chronic Obstructive Pulmonary History: Disease (COPD), Coronary Artery Disease, Hypertension, Type II Diabetes, Osteoarthritis, Neuropathy Photos Photo Uploaded By: Montey Hora on 12/31/2014 15:13:55 Wound Measurements Length: (cm) 10 Width: (cm) 11 Depth: (cm) 0.2 Area: (cm)  86.394 Volume: (cm) 17.279 % Reduction in Area: -144.4% % Reduction in Volume: -144.4% Epithelialization: None Tunneling: No Undermining: No Wound Description Full Thickness Without Foul Odor After Classification: Exposed Support Structures Diabetic Severity Grade 1 (Wagner): Wound Margin: Flat and Intact BRALYN, PESOLA ([id number]) Cleansing: No Exudate Amount: Small Exudate Type: Serous Exudate Color: amber Wound Bed Granulation Amount: Medium (34-66%) Exposed Structure Granulation Quality: Red, Pink Fascia Exposed: No Necrotic Amount: Medium (34-66%) Fat Layer Exposed: No Necrotic Quality: Eschar, Adherent Slough Tendon Exposed: No Muscle Exposed: No Joint Exposed: No Bone Exposed: No Limited to Skin Breakdown Periwound Skin Texture Texture Color No Abnormalities Noted: No No Abnormalities Noted: No Callus: No Atrophie Blanche: No Crepitus: No Cyanosis: No Excoriation: No Ecchymosis: No Fluctuance: No Erythema: No Friable: No Hemosiderin Staining: No Induration: No Mottled: No Localized Edema: No Pallor: No Rash: No Rubor: No Scarring: No Temperature / Pain Moisture Temperature: No Abnormality No Abnormalities Noted: No Dry / Scaly: No Maceration: No Moist: No Wound Preparation Ulcer Cleansing: Rinsed/Irrigated with Saline Topical Anesthetic Applied: None Treatment Notes Wound #1 (Right, Anterior Lower Leg) 1. Cleansed with: Clean wound with Normal Saline 4. Dressing Applied: Contact layer 5. Secondary Dressing Applied ABD and Kerlix/Conform 7. Secured with Tape ELVIS, ZWART ([id number]) Other (specify in notes) Notes coban lightly from toes to bend of the knee Electronic Signature(s) Signed: 12/31/2014 5:13:06 PM By: Montey Hora Entered By: Montey Hora on 12/31/2014 13:20:27 [name] [name] ([id number]) -------------------------------------------------------------------------------- Wound Assessment  Details Patient Name: [name] [name]. Date of Service: 12/31/2014 1:00 PM Medical Record Number: [id number] Patient Account Number: [id number] Date of Birth/Sex: Aug 17, 1945 (69 y.o. Male) Treating RN: Montey Hora Primary Care Physician: Lamonte Sakai Other Clinician: Referring Physician: Lamonte Sakai Treating Physician/Extender: Frann Rider in Treatment: 2  Wound Status Wound Number: 2 Primary Trauma, Other Etiology: Wound Location: Right Lower Leg - Posterior Secondary Diabetic Wound/Ulcer of the Lower Wounding Event: Trauma Etiology: Extremity Date Acquired: 12/16/2014 Wound Open Weeks Of Treatment: 2 Status: Clustered Wound: No Notes: Dog Bite yesterday. 1 stitch sutured in place by ER MD. Comorbid Chronic Obstructive Pulmonary History: Disease (COPD), Coronary Artery Disease, Hypertension, Type II Diabetes, Osteoarthritis, Neuropathy Photos Photo Uploaded By: Montey Hora on 12/31/2014 15:13:55 Wound Measurements Length: (cm) 6 Width: (cm) 7 Depth: (cm) 0.2 Area: (cm) 32.987 Volume: (cm) 6.597 % Reduction in Area: -4102.2% % Reduction in Volume: -4101.9% Epithelialization: None Tunneling: No Undermining: No Wound Description Full Thickness Without Foul Odor After Classification: Exposed Support Structures Diabetic Severity Grade 1 (Wagner): Wound Margin: Flat and Intact Exudate Amount: Small DWANE, VONGUNTEN ([id number]) Cleansing: No Exudate Type: Serous Exudate Color: amber Wound Bed Granulation Amount: Medium (34-66%) Exposed Structure Granulation Quality: Red Fascia Exposed: No Necrotic Amount: Medium (34-66%) Fat Layer Exposed: No Necrotic Quality: Eschar, Adherent Slough Tendon Exposed: No Muscle Exposed: No Joint Exposed: No Bone Exposed: No Limited to Skin Breakdown Periwound Skin Texture Texture Color No Abnormalities Noted: No No Abnormalities Noted: No Callus: No Atrophie Blanche: No Crepitus: No Cyanosis:  No Excoriation: No Ecchymosis: No Fluctuance: No Erythema: No Friable: No Hemosiderin Staining: No Induration: No Mottled: No Localized Edema: No Pallor: No Rash: No Rubor: No Scarring: No Temperature / Pain Moisture Temperature: No Abnormality No Abnormalities Noted: No Dry / Scaly: No Maceration: No Moist: No Wound Preparation Ulcer Cleansing: Rinsed/Irrigated with Saline Topical Anesthetic Applied: None Treatment Notes Wound #2 (Right, Posterior Lower Leg) 1. Cleansed with: Clean wound with Normal Saline 4. Dressing Applied: Contact layer 5. Secondary Dressing Applied ABD and Kerlix/Conform 7. Secured with Tape Other (specify in notes) DECLEN, HOMRICH ([id number]) Notes coban lightly from toes to bend of the knee Electronic Signature(s) Signed: 12/31/2014 5:13:06 PM By: Montey Hora Entered By: Montey Hora on 12/31/2014 13:20:57 [name] [name] ([id number]) -------------------------------------------------------------------------------- Vitals Details Patient Name: [name] [name]. Date of Service: 12/31/2014 1:00 PM Medical Record Number: [id number] Patient Account Number: [id number] Date of Birth/Sex: 1946-01-26 (69 y.o. Male) Treating RN: Montey Hora Primary Care Physician: Lamonte Sakai Other Clinician: Referring Physician: Lamonte Sakai Treating Physician/Extender: Frann Rider in Treatment: 2 Vital Signs Time Taken: 13:09 Temperature (F): 98.2 Height (in): 65 Pulse (bpm): 87 Weight (lbs): 218 Respiratory Rate (breaths/min): 20 Body Mass Index (BMI): 36.3 Blood Pressure (mmHg): 124/54 Reference Range: 80 - 120 mg / dl Electronic Signature(s) Signed: 12/31/2014 5:13:06 PM By: Montey Hora Entered By: Montey Hora on 12/31/2014 13:09:55

## 2015-01-01 NOTE — Progress Notes (Signed)
ADIR, GUNNETT ([id number]) Visit Report for 12/31/2014 Chief Complaint Document Details Patient Name: [name] [name], [name] [name]. Date of Service: 12/31/2014 1:00 PM Medical Record Number: [id number] Patient Account Number: [id number] Date of Birth/Sex: 1945/12/08 (69 y.o. Male) Treating RN: Montey Hora Primary Care Physician: Lamonte Sakai Other Clinician: Referring Physician: Lamonte Sakai Treating Physician/Extender: Frann Rider in Treatment: 2 Information Obtained from: Patient Chief Complaint Patient presents to the wound care center for a consult due non healing wound. He sustained a dog bite yesterday and went to the ER where first aid and suturing was done. Electronic Signature(s) Signed: 12/31/2014 1:31:28 PM By: Christin Fudge MD, FACS Entered By: Christin Fudge on 12/31/2014 13:31:28 [name] [name] ([id number]) -------------------------------------------------------------------------------- HPI Details Patient Name: [name] [name]. Date of Service: 12/31/2014 1:00 PM Medical Record Number: [id number] Patient Account Number: [id number] Date of Birth/Sex: [birth year] (69 y.o. Male) Treating RN: Montey Hora Primary Care Physician: Lamonte Sakai Other Clinician: Referring Physician: Lamonte Sakai Treating Physician/Extender: Frann Rider in Treatment: 2 History of Present Illness Location: right lower extremity Quality: Patient reports experiencing a sharp pain to affected area(s). Severity: Patient states wound (s) are getting better. Duration: Patient states the wound has been present for ____1_days. Timing: Pain in wound is constant (hurts all the time) Context: The wound occurred when the patient was bitten by a dog yesterday morning Modifying Factors: Consults to this date include:was seen in the ED and given IV antibiotics and suturing was done. Associated Signs and Symptoms: Patient reports presence of swelling HPI Description: 69 year old  gentleman was seen in the ED on 12/16/2014 with a dog bite. No notes are available on Epic. Past medical history significant for essential hypertension, COPD, sleep apnea, nephrolithiasis, hyperlipidemia, obesity, diabetes mellitus, coronary artery disease with stable angina. I understand he had IV antibiotics, a CT scan with the head was done because of a fall and this was normal and a x-ray of the leg was done which showed no abnormality. Due to kidney disease his kidney doctor has taken him off his metformin and the patient's blood sugars have been running normal and his last hemoglobin A1c was 6 12/24/2014 -- the patient has had some redness of his lower limb but no fever or change in his diabetic control. He also has a blister come near the area where he had previous dressing applied. Electronic Signature(s) Signed: 12/31/2014 1:31:33 PM By: Christin Fudge MD, FACS Entered By: Christin Fudge on 12/31/2014 13:31:33 [name] [name] ([id number]) -------------------------------------------------------------------------------- Physical Exam Details Patient Name: [name] [name]. Date of Service: 12/31/2014 1:00 PM Medical Record Number: [id number] Patient Account Number: [id number] Date of Birth/Sex: 1946-02-22 (69 y.o. Male) Treating RN: Montey Hora Primary Care Physician: Lamonte Sakai Other Clinician: Referring Physician: Lamonte Sakai Treating Physician/Extender: Frann Rider in Treatment: 2 Constitutional . Pulse regular. Respirations normal and unlabored. Afebrile. . Eyes Nonicteric. Reactive to light. Ears, Nose, Mouth, and Throat Lips, teeth, and gums WNL.Marland Kitchen Moist mucosa without lesions . Neck supple and nontender. No palpable supraclavicular or cervical adenopathy. Normal sized without goiter. Respiratory WNL. No retractions.. Cardiovascular Pedal Pulses WNL. No clubbing, cyanosis or edema. Chest Breasts symmetical and no nipple discharge.. Breast tissue  WNL, no masses, lumps, or tenderness.. Lymphatic No adneopathy. No adenopathy. No adenopathy. Musculoskeletal Adexa without tenderness or enlargement.. Digits and nails w/o clubbing, cyanosis, infection, petechiae, ischemia, or inflammatory conditions.. Integumentary (Hair, Skin) No suspicious lesions. No crepitus or fluctuance. No peri-wound warmth or erythema. No masses.Marland Kitchen Psychiatric Judgement and insight Intact.. No evidence  of depression, anxiety, or agitation.. Notes The wound is fairly clean and though there is some redness there is no gross cellulitis and no drainage. The sutures are intact and they are not cutting through. Electronic Signature(s) Signed: 12/31/2014 1:32:23 PM By: Christin Fudge MD, FACS Entered By: Christin Fudge on 12/31/2014 13:32:23 [name] [name] ([id number]) -------------------------------------------------------------------------------- Physician Orders Details Patient Name: [name] [name]. Date of Service: 12/31/2014 1:00 PM Medical Record Number: [id number] Patient Account Number: [id number] Date of Birth/Sex: Oct 24, 1945 (69 y.o. Male) Treating RN: Montey Hora Primary Care Physician: Lamonte Sakai Other Clinician: Referring Physician: Lamonte Sakai Treating Physician/Extender: Frann Rider in Treatment: 2 Verbal / Phone Orders: Yes Clinician: Montey Hora Read Back and Verified: Yes Diagnosis Coding Wound Cleansing Wound #1 Right,Anterior Lower Leg o Clean wound with Normal Saline. o May Shower, gently pat wound dry prior to applying new dressing. Wound #2 Right,Posterior Lower Leg o Clean wound with Normal Saline. o May Shower, gently pat wound dry prior to applying new dressing. Anesthetic Wound #1 Right,Anterior Lower Leg o Topical Lidocaine 4% cream applied to wound bed prior to debridement Wound #2 Right,Posterior Lower Leg o Topical Lidocaine 4% cream applied to wound bed prior to debridement Primary  Wound Dressing Wound #1 Right,Anterior Lower Leg o Contact layer Wound #2 Right,Posterior Lower Leg o Contact layer Secondary Dressing Wound #1 Right,Anterior Lower Leg o ABD and Kerlix/Conform - coban lightly - kerlix and coban from toes to bend of the knee Wound #2 Right,Posterior Lower Leg o ABD and Kerlix/Conform - coban lightly - kerlix and coban from toes to bend of the knee Dressing Change Frequency Wound #1 Right,Anterior Lower Leg o Change dressing every week Wound #2 Right,Posterior Lower Leg o Change dressing every week [name] [name], [name] [name] ([id number]) Follow-up Appointments Wound #1 Right,Anterior Lower Leg o Return Appointment in 1 week. Wound #2 Right,Posterior Lower Leg o Return Appointment in 1 week. Edema Control Wound #1 Right,Anterior Lower Leg o Elevate legs to the level of the heart and pump ankles as often as possible Wound #2 Right,Posterior Lower Leg o Elevate legs to the level of the heart and pump ankles as often as possible Additional Orders / Instructions Wound #1 Right,Anterior Lower Leg o Activity as tolerated Wound #2 Right,Posterior Lower Leg o Activity as tolerated Electronic Signature(s) Signed: 12/31/2014 4:23:40 PM By: Christin Fudge MD, FACS Signed: 12/31/2014 5:13:06 PM By: Montey Hora Entered By: Montey Hora on 12/31/2014 13:28:16 [name] [name] ([id number]) -------------------------------------------------------------------------------- Problem List Details Patient Name: [name] [name]. Date of Service: 12/31/2014 1:00 PM Medical Record Number: [id number] Patient Account Number: [id number] Date of Birth/Sex: 09-11-1945 (69 y.o. Male) Treating RN: Montey Hora Primary Care Physician: Lamonte Sakai Other Clinician: Referring Physician: Lamonte Sakai Treating Physician/Extender: Frann Rider in Treatment: 2 Active Problems ICD-10 Encounter Code Description Active  Date Diagnosis E11.621 Type 2 diabetes mellitus with foot ulcer 12/17/2014 Yes W54.0XXA Bitten by dog, initial encounter 12/17/2014 Yes S81.811A Laceration without foreign body, right lower leg, initial 12/17/2014 Yes encounter L97.212 Non-pressure chronic ulcer of right calf with fat layer 12/17/2014 Yes exposed Inactive Problems Resolved Problems Electronic Signature(s) Signed: 12/31/2014 1:31:18 PM By: Christin Fudge MD, FACS Entered By: Christin Fudge on 12/31/2014 13:31:18 [name] [name] ([id number]) -------------------------------------------------------------------------------- Progress Note Details Patient Name: [name] [name]. Date of Service: 12/31/2014 1:00 PM Medical Record Number: [id number] Patient Account Number: [id number] Date of Birth/Sex: 14-Sep-1945 (69 y.o. Male) Treating RN: Montey Hora Primary Care Physician: Lamonte Sakai Other Clinician: Referring Physician: Lamonte Sakai Treating  Physician/Extender: Frann Rider in Treatment: 2 Subjective Chief Complaint Information obtained from Patient Patient presents to the wound care center for a consult due non healing wound. He sustained a dog bite yesterday and went to the ER where first aid and suturing was done. History of Present Illness (HPI) The following HPI elements were documented for the patient's wound: Location: right lower extremity Quality: Patient reports experiencing a sharp pain to affected area(s). Severity: Patient states wound (s) are getting better. Duration: Patient states the wound has been present for ____1_days. Timing: Pain in wound is constant (hurts all the time) Context: The wound occurred when the patient was bitten by a dog yesterday morning Modifying Factors: Consults to this date include:was seen in the ED and given IV antibiotics and suturing was done. Associated Signs and Symptoms: Patient reports presence of swelling 69 year old gentleman was seen in the ED on  12/16/2014 with a dog bite. No notes are available on Epic. Past medical history significant for essential hypertension, COPD, sleep apnea, nephrolithiasis, hyperlipidemia, obesity, diabetes mellitus, coronary artery disease with stable angina. I understand he had IV antibiotics, a CT scan with the head was done because of a fall and this was normal and a x-ray of the leg was done which showed no abnormality. Due to kidney disease his kidney doctor has taken him off his metformin and the patient's blood sugars have been running normal and his last hemoglobin A1c was 6 12/24/2014 -- the patient has had some redness of his lower limb but no fever or change in his diabetic control. He also has a blister come near the area where he had previous dressing applied. Objective Constitutional Pulse regular. Respirations normal and unlabored. Afebrile. [name] [name], [name] [name] ([id number]) Vitals Time Taken: 1:09 PM, Height: 65 in, Weight: 218 lbs, BMI: 36.3, Temperature: 98.2 F, Pulse: 87 bpm, Respiratory Rate: 20 breaths/min, Blood Pressure: 124/54 mmHg. Eyes Nonicteric. Reactive to light. Ears, Nose, Mouth, and Throat Lips, teeth, and gums WNL.Marland Kitchen Moist mucosa without lesions . Neck supple and nontender. No palpable supraclavicular or cervical adenopathy. Normal sized without goiter. Respiratory WNL. No retractions.. Cardiovascular Pedal Pulses WNL. No clubbing, cyanosis or edema. Chest Breasts symmetical and no nipple discharge.. Breast tissue WNL, no masses, lumps, or tenderness.. Lymphatic No adneopathy. No adenopathy. No adenopathy. Musculoskeletal Adexa without tenderness or enlargement.. Digits and nails w/o clubbing, cyanosis, infection, petechiae, ischemia, or inflammatory conditions.Marland Kitchen Psychiatric Judgement and insight Intact.. No evidence of depression, anxiety, or agitation.. General Notes: The wound is fairly clean and though there is some redness there is no gross cellulitis  and no drainage. The sutures are intact and they are not cutting through. Integumentary (Hair, Skin) No suspicious lesions. No crepitus or fluctuance. No peri-wound warmth or erythema. No masses.. Wound #1 status is Open. Original cause of wound was Trauma. The wound is located on the Right,Anterior Lower Leg. The wound measures 10cm length x 11cm width x 0.2cm depth; 86.394cm^2 area and 17.279cm^3 volume. The wound is limited to skin breakdown. There is no tunneling or undermining noted. There is a small amount of serous drainage noted. The wound margin is flat and intact. There is medium (34-66%) red, pink granulation within the wound bed. There is a medium (34-66%) amount of necrotic tissue within the wound bed including Eschar and Adherent Slough. The periwound skin appearance did not exhibit: Callus, Crepitus, Excoriation, Fluctuance, Friable, Induration, Localized Edema, Rash, Scarring, Dry/Scaly, Maceration, Moist, Atrophie Blanche, Cyanosis, Ecchymosis, Hemosiderin Staining, Mottled, Pallor, Rubor, Erythema. Periwound temperature  was noted as No Abnormality. Wound #2 status is Open. Original cause of wound was Trauma. The wound is located on the Right,Posterior Lower Leg. The wound measures 6cm length x 7cm width x 0.2cm depth; 32.987cm^2 area [name] [name], [name] P. ([id number]) and 6.597cm^3 volume. The wound is limited to skin breakdown. There is no tunneling or undermining noted. There is a small amount of serous drainage noted. The wound margin is flat and intact. There is medium (34-66%) red granulation within the wound bed. There is a medium (34-66%) amount of necrotic tissue within the wound bed including Eschar and Adherent Slough. The periwound skin appearance did not exhibit: Callus, Crepitus, Excoriation, Fluctuance, Friable, Induration, Localized Edema, Rash, Scarring, Dry/Scaly, Maceration, Moist, Atrophie Blanche, Cyanosis, Ecchymosis, Hemosiderin Staining, Mottled, Pallor,  Rubor, Erythema. Periwound temperature was noted as No Abnormality. Assessment Active Problems ICD-10 E11.621 - Type 2 diabetes mellitus with foot ulcer W54.0XXA - Bitten by dog, initial encounter [id number] - Laceration without foreign body, right lower leg, initial encounter [phone] - Non-pressure chronic ulcer of right calf with fat layer exposed He has completed his course of antibiotics but there does not seem to be any need for further course. The sutures will be left in place and I have recommended a nonadherent dressing and a 2 layer compression as he has an element of varicose veins and edema. He is urged to keep his diabetes under control and see me back next week. Plan Wound Cleansing: Wound #1 Right,Anterior Lower Leg: Clean wound with Normal Saline. May Shower, gently pat wound dry prior to applying new dressing. Wound #2 Right,Posterior Lower Leg: Clean wound with Normal Saline. May Shower, gently pat wound dry prior to applying new dressing. Anesthetic: Wound #1 Right,Anterior Lower Leg: Topical Lidocaine 4% cream applied to wound bed prior to debridement Wound #2 Right,Posterior Lower Leg: Topical Lidocaine 4% cream applied to wound bed prior to debridement Primary Wound Dressing: Wound #1 Right,Anterior Lower Leg: Contact layer [name] [name], [name] [name] ([id number]) Wound #2 Right,Posterior Lower Leg: Contact layer Secondary Dressing: Wound #1 Right,Anterior Lower Leg: ABD and Kerlix/Conform - coban lightly - kerlix and coban from toes to bend of the knee Wound #2 Right,Posterior Lower Leg: ABD and Kerlix/Conform - coban lightly - kerlix and coban from toes to bend of the knee Dressing Change Frequency: Wound #1 Right,Anterior Lower Leg: Change dressing every week Wound #2 Right,Posterior Lower Leg: Change dressing every week Follow-up Appointments: Wound #1 Right,Anterior Lower Leg: Return Appointment in 1 week. Wound #2 Right,Posterior Lower Leg: Return  Appointment in 1 week. Edema Control: Wound #1 Right,Anterior Lower Leg: Elevate legs to the level of the heart and pump ankles as often as possible Wound #2 Right,Posterior Lower Leg: Elevate legs to the level of the heart and pump ankles as often as possible Additional Orders / Instructions: Wound #1 Right,Anterior Lower Leg: Activity as tolerated Wound #2 Right,Posterior Lower Leg: Activity as tolerated He has completed his course of antibiotics but there does not seem to be any need for further course. The sutures will be left in place and I have recommended a nonadherent dressing and a 2 layer compression as he has an element of varicose veins and edema. He is urged to keep his diabetes under control and see me back next week. Electronic Signature(s) Signed: 12/31/2014 1:33:36 PM By: Christin Fudge MD, FACS Entered By: Christin Fudge on 12/31/2014 13:33:36 [name] [name] ([id number]) -------------------------------------------------------------------------------- SuperBill Details Patient Name: [name] [name]. Date of Service: 12/31/2014 Medical Record Number: [id number] Patient  Account Number: [id number] Date of Birth/Sex: 11-01-1945 (69 y.o. Male) Treating RN: Montey Hora Primary Care Physician: Lamonte Sakai Other Clinician: Referring Physician: Lamonte Sakai Treating Physician/Extender: Frann Rider in Treatment: 2 Diagnosis Coding ICD-10 Codes Code Description E11.621 Type 2 diabetes mellitus with foot ulcer W54.0XXA Bitten by dog, initial encounter [id number] Laceration without foreign body, right lower leg, initial encounter L97.212 Non-pressure chronic ulcer of right calf with fat layer exposed Facility Procedures CPT4 Code: [id number] Description: 99214 - WOUND CARE VISIT-LEV 4 EST PT Modifier: Quantity: 1 Physician Procedures CPT4 Code Description: [id number] - WC PHYS LEVEL 3 - EST PT ICD-10 Description Diagnosis E11.621 Type 2 diabetes  mellitus with foot ulcer W54.0XXA Bitten by dog, initial encounter [id number] Laceration without foreign body, right lower leg, init  L97.212 Non-pressure chronic ulcer of right calf with fat laye Modifier: ial encounte r exposed Quantity: 1 r Electronic Signature(s) Signed: 12/31/2014 1:34:02 PM By: Christin Fudge MD, FACS Entered By: Christin Fudge on 12/31/2014 13:34:02

## 2015-01-07 ENCOUNTER — Encounter: Payer: Medicare HMO | Admitting: Surgery

## 2015-01-07 DIAGNOSIS — L97212 Non-pressure chronic ulcer of right calf with fat layer exposed: Secondary | ICD-10-CM | POA: Diagnosis not present

## 2015-01-07 NOTE — Progress Notes (Signed)
KHILAN, GILLISON ([id number]) Visit Report for 01/07/2015 Arrival Information Details [name] [name] Date of Service: 01/07/2015 1:00 PM Patient Name: P. Patient Account Number: [id number] Medical Record Afful, RN, BSN, [id number] Treating RN: Number: Velva Harman Date of Birth/Sex: [birth year] (69 y.o. Male) Other Clinician: Primary Care Physician: Lamonte Sakai Treating Christin Fudge Referring Physician: Lamonte Sakai Physician/Extender: Suella Grove in Treatment: 3 Visit Information History Since Last Visit Added or deleted any medications: No Patient Arrived: Ambulatory Any new allergies or adverse reactions: No Arrival Time: 13:10 Had a fall or experienced change in No Accompanied By: self activities of daily living that may affect Transfer Assistance: None risk of falls: Patient Identification Verified: Yes Signs or symptoms of abuse/neglect since last No Secondary Verification Process Yes visito Completed: Hospitalized since last visit: No Patient Has Alerts: Yes Has Dressing in Place as Prescribed: Yes Patient Alerts: DMII Has Compression in Place as Prescribed: Yes aspirin 81 Pain Present Now: No ABI L 1.25 NO ABI R R/T wound Electronic Signature(s) Signed: 01/07/2015 1:14:01 PM By: Regan Lemming BSN, RN Entered By: Regan Lemming on 01/07/2015 13:14:01 [name] [name] ([id number]) -------------------------------------------------------------------------------- Clinic Level of Care Assessment Details [name] [name] Date of Service: 01/07/2015 1:00 PM Patient Name: P. Patient Account Number: [id number] Medical Record Afful, RN, BSN, [id number] Treating RN: Number: Velva Harman Date of Birth/Sex: Oct 22, 1945 (69 y.o. Male) Other Clinician: Primary Care Physician: Lamonte Sakai Treating Christin Fudge Referring Physician: Lamonte Sakai Physician/Extender: Suella Grove in Treatment: 3 Clinic Level of Care Assessment Items TOOL 4 Quantity Score []  - Use when only an EandM is  performed on FOLLOW-UP visit 0 ASSESSMENTS - Nursing Assessment / Reassessment X - Reassessment of Co-morbidities (includes updates in patient status) 1 10 X - Reassessment of Adherence to Treatment Plan 1 5 ASSESSMENTS - Wound and Skin Assessment / Reassessment []  - Simple Wound Assessment / Reassessment - one wound 0 X - Complex Wound Assessment / Reassessment - multiple wounds 2 5 []  - Dermatologic / Skin Assessment (not related to wound area) 0 ASSESSMENTS - Focused Assessment []  - Circumferential Edema Measurements - multi extremities 0 []  - Nutritional Assessment / Counseling / Intervention 0 X - Lower Extremity Assessment (monofilament, tuning fork, pulses) 1 5 []  - Peripheral Arterial Disease Assessment (using hand held doppler) 0 ASSESSMENTS - Ostomy and/or Continence Assessment and Care []  - Incontinence Assessment and Management 0 []  - Ostomy Care Assessment and Management (repouching, etc.) 0 PROCESS - Coordination of Care X - Simple Patient / Family Education for ongoing care 1 15 []  - Complex (extensive) Patient / Family Education for ongoing care 0 []  - Staff obtains Programmer, systems, Records, Test Results / Process Orders 0 []  - Staff telephones HHA, Nursing Homes / Clarify orders / etc 0 [name] [name], [name] [name] ([id number]) []  - Routine Transfer to another Facility (non-emergent condition) 0 []  - Routine Hospital Admission (non-emergent condition) 0 []  - New Admissions / Biomedical engineer / Ordering NPWT, Apligraf, etc. 0 []  - Emergency Hospital Admission (emergent condition) 0 []  - Simple Discharge Coordination 0 []  - Complex (extensive) Discharge Coordination 0 PROCESS - Special Needs []  - Pediatric / Minor Patient Management 0 []  - Isolation Patient Management 0 []  - Hearing / Language / Visual special needs 0 []  - Assessment of Community assistance (transportation, D/C planning, etc.) 0 []  - Additional assistance / Altered mentation 0 []  - Support Surface(s)  Assessment (bed, cushion, seat, etc.) 0 INTERVENTIONS - Wound Cleansing / Measurement []  - Simple Wound Cleansing - one wound 0 X - Complex Wound  Cleansing - multiple wounds 2 5 X - Wound Imaging (photographs - any number of wounds) 1 5 []  - Wound Tracing (instead of photographs) 0 []  - Simple Wound Measurement - one wound 0 X - Complex Wound Measurement - multiple wounds 2 5 INTERVENTIONS - Wound Dressings []  - Small Wound Dressing one or multiple wounds 0 X - Medium Wound Dressing one or multiple wounds 2 15 []  - Large Wound Dressing one or multiple wounds 0 []  - Application of Medications - topical 0 []  - Application of Medications - injection 0 [name] [name], [name] [name] ([id number]) INTERVENTIONS - Miscellaneous []  - External ear exam 0 []  - Specimen Collection (cultures, biopsies, blood, body fluids, etc.) 0 []  - Specimen(s) / Culture(s) sent or taken to Lab for analysis 0 []  - Patient Transfer (multiple staff / [name] / Similar devices) 0 []  - Simple Staple / Suture removal (25 or less) 0 []  - Complex Staple / Suture removal (26 or more) 0 []  - Hypo / Hyperglycemic Management (close monitor of Blood Glucose) 0 []  - Ankle / Brachial Index (ABI) - do not check if billed separately 0 X - Vital Signs 1 5 Has the patient been seen at the hospital within the last three years: Yes Total Score: 105 Level Of Care: New/Established - Level 3 Electronic Signature(s) Signed: 01/07/2015 1:37:45 PM By: Regan Lemming BSN, RN Entered By: Regan Lemming on 01/07/2015 13:37:44 [name] [name] ([id number]) -------------------------------------------------------------------------------- Encounter Discharge Information Details [name] [name] Date of Service: 01/07/2015 1:00 PM Patient Name: P. Patient Account Number: [id number] Medical Record Afful, RN, BSN, [id number] Treating RN: Number: Velva Harman Date of Birth/Sex: Oct 07, 1945 (69 y.o. Male) Other Clinician: Primary Care Physician: Lamonte Sakai Treating Christin Fudge Referring Physician: Lamonte Sakai Physician/Extender: Suella Grove in Treatment: 3 Encounter Discharge Information Items Discharge Pain Level: 0 Discharge Condition: Stable Ambulatory Status: Ambulatory Discharge Destination: Home Transportation: Private Auto Accompanied By: self Schedule Follow-up Appointment: No Medication Reconciliation completed and provided to Patient/Care No [name] [name]: Provided on Clinical Summary of Care: 01/07/2015 Form Type Recipient Paper Patient WB Electronic Signature(s) Signed: 01/07/2015 1:37:30 PM By: Ruthine Dose Previous Signature: 01/07/2015 1:26:49 PM Version By: Regan Lemming BSN, RN Entered By: Ruthine Dose on 01/07/2015 13:37:29 [name] [name] ([id number]) -------------------------------------------------------------------------------- Lower Extremity Assessment Details [name] [name] Date of Service: 01/07/2015 1:00 PM Patient Name: P. Patient Account Number: [id number] Medical Record Afful, RN, BSN, [id number] Treating RN: Number: Velva Harman Date of Birth/Sex: March 11, 1946 (69 y.o. Male) Other Clinician: Primary Care Physician: Lamonte Sakai Treating Christin Fudge Referring Physician: Lamonte Sakai Physician/Extender: Suella Grove in Treatment: 3 Edema Assessment Assessed: [Left: No] [Right: No] E[Left: dema] [Right: :] Calf Left: Right: Point of Measurement: 32 cm From Medial Instep cm 39.5 cm Ankle Left: Right: Point of Measurement: 10 cm From Medial Instep cm 22.3 cm Vascular Assessment Claudication: Claudication Assessment [Right:None] Pulses: Posterior Tibial Dorsalis Pedis Palpable: [Right:Yes] Extremity colors, hair growth, and conditions: Extremity Color: [Right:Mottled] Hair Growth on Extremity: [Right:Yes] Temperature of Extremity: [Right:Warm] Capillary Refill: [Right:< 3 seconds] Toe Nail Assessment Left: Right: Thick: Yes Discolored: No Deformed: No Improper Length and Hygiene:  No Electronic Signature(s) Signed: 01/07/2015 1:16:36 PM By: Regan Lemming BSN, RN [name] [name] ([id number]) Entered By: Regan Lemming on 01/07/2015 13:16:36 [name] [name] ([id number]) -------------------------------------------------------------------------------- Multi Wound Chart Details [name] [name] Date of Service: 01/07/2015 1:00 PM Patient Name: P. Patient Account Number: [id number] Medical Record Afful, RN, BSN, [id number] Treating RN: Number: Velva Harman Date of Birth/Sex: 07/24/1945 (69 y.o. 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] [2:the Lower Extremity] Comorbid History: [1:Chronic Obstructive Pulmonary Disease (COPD), Coronary Artery (COPD), Coronary Artery Disease, Hypertension, Type II Diabetes, Osteoarthritis, Neuropathy Osteoarthritis, Neuropathy] [2:Chronic Obstructive Pulmonary Disease Disease,  Hypertension, Type II Diabetes,] [N/A:N/A] Date Acquired: [1:12/16/2014] [2:12/16/2014] [N/A:N/A] Weeks of Treatment: [1:3] [2:3] [N/A:N/A] Wound Status: [1:Open] [2:Open] [N/A:N/A] Measurements L x W x D 10x9x0.2 [2:6x7x0.2] [N/A:N/A] (cm) Area (cm) : [1:70.686] [2:32.987] [N/A:N/A] Volume (cm) : [1:14.137] [2:6.597] [N/A:N/A] % Reduction in Area: [1:-100.00%] [2:-4102.20%] [N/A:N/A] % Reduction in Volume: -100.00%  [2:-4101.90%] [N/A:N/A] Classification: [1:Full Thickness Without Exposed Support Structures] [2:Full Thickness Without Exposed Support Structures] [N/A:N/A] HBO Classification: [1:Grade 1] [2:Grade 1] [N/A:N/A] Exudate Amount: [1:Small] [2:Small] [N/A:N/A] Exudate Type: [1:Serous] [2:Serous] [N/A:N/A] Exudate Color: amber amber N/A Wound Margin: Flat and Intact Flat and Intact N/A Granulation Amount: Medium (34-66%) Medium (34-66%) N/A Granulation Quality: Red, Pink Red N/A Necrotic Amount: Medium (34-66%) Medium (34-66%) N/A Necrotic Tissue: Eschar, Adherent Slough Eschar, Adherent Slough N/A Exposed Structures: Fascia: No Fascia: No N/A Fat: No Fat: No Tendon: No Tendon: No Muscle: No Muscle: No Joint: No Joint: No Bone: No Bone: No Limited to Skin Limited to Skin Breakdown Breakdown Epithelialization: None None N/A Periwound Skin Texture: Edema: Yes Edema: No N/A Excoriation: No Excoriation: No Induration: No Induration: No Callus: No Callus: No Crepitus: No Crepitus: No Fluctuance: No Fluctuance: No Friable: No Friable: No Rash: No Rash: No Scarring: No Scarring: No Periwound Skin Moist: Yes Moist: Yes N/A Moisture: Maceration: No Dry/Scaly: Yes Dry/Scaly: No Maceration: No Periwound Skin Color: Atrophie Blanche: No Atrophie Blanche: No N/A Cyanosis: No Cyanosis: No Ecchymosis: No Ecchymosis: No Erythema: No Erythema: No Hemosiderin Staining: No Hemosiderin Staining: No Mottled: No Mottled: No Pallor: No Pallor: No Rubor: No Rubor: No Temperature: No Abnormality No Abnormality N/A Tenderness on Yes No N/A Palpation: Wound Preparation: Ulcer Cleansing: Ulcer Cleansing: N/A Rinsed/Irrigated with Rinsed/Irrigated with Saline Saline Topical Anesthetic Topical Anesthetic Applied: Other: lidocaine Applied: Other: lidocaine 4% 4% Treatment Notes Electronic Signature(s) [name] [name], [name] [name] ([id number]) Signed: 01/07/2015 1:25:38 PM By:  Regan Lemming BSN, RN Entered By: Regan Lemming on 01/07/2015 13:25:38 [name] [name] ([id number]) -------------------------------------------------------------------------------- Multi-Disciplinary Care Plan Details [name] [name] Date of Service: 01/07/2015 1:00 PM Patient Name: P. Patient Account Number: [id number] Medical Record Afful, RN, BSN, [id number] Treating RN: Number: Velva Harman Date of Birth/Sex: 1945-06-10 (69 y.o. Male) Other Clinician: Primary Care Physician: Lamonte Sakai Treating Christin Fudge Referring Physician: Lamonte Sakai Physician/Extender: Suella Grove in Treatment: 3 Active Inactive Abuse / Safety / Falls / Self Care Management Nursing Diagnoses: Potential for falls Goals: Patient will remain injury free Date Initiated: 12/17/2014 Goal Status: Active Interventions: Assess fall risk on admission and as needed Notes: Orientation to the Wound Care Program Nursing Diagnoses: Knowledge deficit related to the wound healing center program Goals: Patient/caregiver will verbalize understanding of the [place] Program Date Initiated: 12/17/2014 Goal Status: Active Interventions: Provide education on orientation to the wound center Notes: Wound/Skin Impairment Nursing Diagnoses: Impaired tissue integrity Goals: [name] [name], [name] [name] ([id number]) Ulcer/skin breakdown will have a volume reduction of  30% by week 4 Date Initiated: 12/17/2014 Goal Status: Active Ulcer/skin breakdown will have a volume reduction of 50% by week 8 Date Initiated: 12/17/2014 Goal Status: Active Ulcer/skin breakdown will have a volume reduction of 80% by week 12 Date Initiated: 12/17/2014 Goal Status: Active Ulcer/skin breakdown will heal within 14 weeks Date Initiated: 12/17/2014 Goal Status: Active Interventions: Assess ulceration(s) every visit Notes: Electronic Signature(s) Signed: 01/07/2015 1:25:28 PM By: Regan Lemming BSN, RN Entered By: Regan Lemming on 01/07/2015  13:25:28 [name] [name] ([id number]) -------------------------------------------------------------------------------- Pain Assessment Details [name] [name] Date of Service: 01/07/2015 1:00 PM Patient Name: P. Patient Account Number: [id number] Medical Record Afful, RN, BSN, [id number] Treating RN: Number: Velva Harman Date of Birth/Sex: [birth year] (69 y.o. Male) Other Clinician: Primary Care Physician: Lamonte Sakai Treating Christin Fudge Referring Physician: Lamonte Sakai Physician/Extender: Suella Grove in Treatment: 3 Active Problems Location of Pain Severity and Description of Pain Patient Has Paino No Site Locations Pain Management and Medication Current Pain Management: Electronic Signature(s) Signed: 01/07/2015 1:14:07 PM By: Regan Lemming BSN, RN Entered By: Regan Lemming on 01/07/2015 13:14:07 [name] [name] ([id number]) -------------------------------------------------------------------------------- Patient/Caregiver Education Details [name] [name] Date of Service: 01/07/2015 1:00 PM Patient Name: P. Patient Account Number: [id number] Medical Record Afful, RN, BSN, [id number] Treating RN: Number: Velva Harman Date of Birth/Gender: [birth year] (69 y.o. Male) Other Clinician: Primary Care Physician: Lamonte Sakai Treating Christin Fudge Referring Physician: Lamonte Sakai Physician/Extender: Suella Grove in Treatment: 3 Education Assessment Education Provided To: Patient Education Topics Provided Welcome To [facility]: Methods: Explain/Verbal Responses: State content correctly Electronic Signature(s) Signed: 01/07/2015 1:26:34 PM By: Regan Lemming BSN, RN Entered By: Regan Lemming on 01/07/2015 13:26:34 [name] [name] ([id number]) -------------------------------------------------------------------------------- Wound Assessment Details [name] [name] Date of Service: 01/07/2015 1:00 PM Patient Name: P. Patient Account Number: [id number] Medical Record Afful,  RN, BSN, [id number] Treating RN: Number: Velva Harman Date of Birth/Sex: [birth year] (68 y.o. Male) Other Clinician: Primary Care Physician: Lamonte Sakai Treating Christin Fudge Referring Physician: Lamonte Sakai Physician/Extender: Suella Grove in Treatment: 3 Wound Status Wound Number: 1 Primary Trauma, Other Etiology: Wound Location: Right Lower Leg - Anterior Secondary Diabetic Wound/Ulcer of the Lower Wounding Event: Trauma Etiology: Extremity Date Acquired: 12/16/2014 Wound Open Weeks Of Treatment: 3 Status: Clustered Wound: No Notes: Patient bitten by a dog yesterday, went to the ER and 9 stitches used to suture skin flap together. Comorbid Chronic Obstructive Pulmonary History: Disease (COPD), Coronary Artery Disease, Hypertension, Type II Diabetes, Osteoarthritis, Neuropathy Photos Photo Uploaded By: Regan Lemming on 01/07/2015 17:20:18 Wound Measurements Length: (cm) 10 Width: (cm) 9 Depth: (cm) 0.2 Area: (cm) 70.686 Volume: (cm) 14.137 % Reduction in Area: -100% % Reduction in Volume: -100% Epithelialization: None Tunneling: No Undermining: No Wound Description Full Thickness Without Classification: Exposed Support Structures Grade 1 [name] [name], [name] [name] ([id number]) Foul Odor After Cleansing: No Diabetic Severity [name] [name]): Wound Margin: Flat and Intact Exudate Amount: Small Exudate Type: Serous Exudate Color: amber Wound Bed Granulation Amount: Medium (34-66%) Exposed Structure Granulation Quality: Red, Pink Fascia Exposed: No Necrotic Amount: Medium (34-66%) Fat Layer Exposed: No Necrotic Quality: Eschar, Adherent Slough Tendon Exposed: No Muscle Exposed: No Joint Exposed: No Bone Exposed: No Limited to Skin Breakdown Periwound Skin Texture Texture Color No Abnormalities Noted: No No Abnormalities Noted: No Callus: No Atrophie Blanche: No Crepitus: No Cyanosis: No Excoriation: No Ecchymosis: No Fluctuance: No Erythema: No Friable:  No Hemosiderin Staining: No Induration: No Mottled: No Localized Edema: Yes Pallor: No Rash: No Rubor: No Scarring: No Temperature / Pain Moisture Temperature: No Abnormality No  Abnormalities Noted: No Tenderness on Palpation: Yes Dry / Scaly: No Maceration: No Moist: Yes Wound Preparation Ulcer Cleansing: Rinsed/Irrigated with Saline Topical Anesthetic Applied: Other: lidocaine 4%, Treatment Notes Wound #1 (Right, Anterior Lower Leg) 1. Cleansed with: Clean wound with Normal Saline 4. Dressing Applied: Aquacel Ag 5. Secondary Dressing Applied [name] [name], [name] [name] ([id number]) Gauze and Kerlix/Conform 7. Secured with Self adhesive bandage Electronic Signature(s) Signed: 01/07/2015 1:20:54 PM By: Regan Lemming BSN, RN Entered By: Regan Lemming on 01/07/2015 13:20:53 [name] [name] ([id number]) -------------------------------------------------------------------------------- Wound Assessment Details [name] [name] Date of Service: 01/07/2015 1:00 PM Patient Name: P. Patient Account Number: [id number] Medical Record Afful, RN, BSN, [id number] Treating RN: Number: Velva Harman Date of Birth/Sex: 1946/02/21 (69 y.o. Male) Other Clinician: Primary Care Physician: Lamonte Sakai Treating Christin Fudge Referring Physician: Lamonte Sakai Physician/Extender: Suella Grove in Treatment: 3 Wound Status Wound Number: 2 Primary Trauma, Other Etiology: Wound Location: Right Lower Leg - Posterior Secondary Diabetic Wound/Ulcer of the Lower Wounding Event: Trauma Etiology: Extremity Date Acquired: 12/16/2014 Wound Open Weeks Of Treatment: 3 Status: Clustered Wound: No Notes: Dog Bite yesterday. 1 stitch sutured in place by ER MD. Comorbid Chronic Obstructive Pulmonary History: Disease (COPD), Coronary Artery Disease, Hypertension, Type II Diabetes, Osteoarthritis, Neuropathy Photos Photo Uploaded By: Regan Lemming on 01/07/2015 17:20:19 Wound Measurements Length: (cm) 6 Width:  (cm) 7 Depth: (cm) 0.2 Area: (cm) 32.987 Volume: (cm) 6.597 % Reduction in Area: -4102.2% % Reduction in Volume: -4101.9% Epithelialization: None Tunneling: No Undermining: No Wound Description Full Thickness Without Classification: Exposed Support Structures Diabetic Severity Grade 1 (Wagner): [name] [name], [name] [name] ([id number]) Foul Odor After Cleansing: No Wound Margin: Flat and Intact Exudate Amount: Small Exudate Type: Serous Exudate Color: amber Wound Bed Granulation Amount: Medium (34-66%) Exposed Structure Granulation Quality: Red Fascia Exposed: No Necrotic Amount: Medium (34-66%) Fat Layer Exposed: No Necrotic Quality: Eschar, Adherent Slough Tendon Exposed: No Muscle Exposed: No Joint Exposed: No Bone Exposed: No Limited to Skin Breakdown Periwound Skin Texture Texture Color No Abnormalities Noted: No No Abnormalities Noted: No Callus: No Atrophie Blanche: No Crepitus: No Cyanosis: No Excoriation: No Ecchymosis: No Fluctuance: No Erythema: No Friable: No Hemosiderin Staining: No Induration: No Mottled: No Localized Edema: No Pallor: No Rash: No Rubor: No Scarring: No Temperature / Pain Moisture Temperature: No Abnormality No Abnormalities Noted: No Dry / Scaly: Yes Maceration: No Moist: Yes Wound Preparation Ulcer Cleansing: Rinsed/Irrigated with Saline Topical Anesthetic Applied: Other: lidocaine 4%, Treatment Notes Wound #2 (Right, Posterior Lower Leg) 1. Cleansed with: Clean wound with Normal Saline 4. Dressing Applied: Aquacel Ag 5. Secondary Dressing Applied Gauze and Kerlix/Conform 7. Secured with [name] [name], [name] [name] ([id number]) Self adhesive bandage Electronic Signature(s) Signed: 01/07/2015 1:21:21 PM By: Regan Lemming BSN, RN Entered By: Regan Lemming on 01/07/2015 13:21:21 [name] [name] ([id number]) -------------------------------------------------------------------------------- Vitals Details [name] [name] Date of Service: 01/07/2015 1:00 PM Patient Name: P. Patient Account Number: [id number] Medical Record Afful, RN, BSN, [id number] Treating RN: Number: Velva Harman Date of Birth/Sex: 1945-09-14 (69 y.o. Male) Other Clinician: Primary Care Physician: Lamonte Sakai Treating Christin Fudge Referring Physician: Lamonte Sakai Physician/Extender: Suella Grove in Treatment: 3 Vital Signs Time Taken: 13:14 Temperature (F): 97.9 Height (in): 65 Pulse (bpm): 82 Weight (lbs): 218 Respiratory Rate (breaths/min): 18 Body Mass Index (BMI): 36.3 Blood Pressure (mmHg): 140/59 Reference Range: 80 - 120 mg / dl Electronic Signature(s) Signed: 01/07/2015 1:14:30 PM By: Regan Lemming BSN, RN Entered By: Regan Lemming on 01/07/2015 13:14:30

## 2015-01-08 NOTE — Progress Notes (Signed)
JAZIR, LICHTENWALNER ([id number]) Visit Report for 01/07/2015 Chief Complaint Document Details [name] [name] Date of Service: 01/07/2015 1:00 PM Patient Name: P. Patient Account Number: [id number] Medical Record Afful, RN, BSN, [id number] Treating RN: Number: Velva Harman Date of Birth/Sex: 1945/11/28 (69 y.o. Male) Other Clinician: Primary Care Treating Ashok Cordia Physician: Physician/Extender: Referring Physician: Orland Mustard in Treatment: 3 Information Obtained from: Patient Chief Complaint Patient presents to the wound care center for a consult due non healing wound. He sustained a dog bite yesterday and went to the ER where first aid and suturing was done. Electronic Signature(s) Signed: 01/07/2015 1:54:48 PM By: Christin Fudge MD, FACS Entered By: Christin Fudge on 01/07/2015 13:54:48 [name] ([id number]) -------------------------------------------------------------------------------- HPI Details [name] [name] Date of Service: 01/07/2015 1:00 PM Patient Name: P. Patient Account Number: [id number] Medical Record Afful, RN, BSN, [id number] Treating RN: Number: Velva Harman Date of Birth/Sex: February 05, 1946 (69 y.o. Male) Other Clinician: [place], [place], [place] Physician: Physician/Extender: Referring Physician: Orland Mustard in Treatment: 3 History of Present Illness Location: right lower extremity Quality: Patient reports experiencing a sharp pain to affected area(s). Severity: Patient states wound (s) are getting better. Duration: Patient states the wound has been present for ____1_days. Timing: Pain in wound is constant (hurts all the time) Context: The wound occurred when the patient was bitten by a dog yesterday morning Modifying Factors: Consults to this date include:was seen in the ED and given IV antibiotics and suturing was done. Associated Signs and Symptoms: Patient reports presence of swelling HPI  Description: 69 year old gentleman was seen in the ED on 12/16/2014 with a dog bite. No notes are available on Epic. Past medical history significant for essential hypertension, COPD, sleep apnea, nephrolithiasis, hyperlipidemia, obesity, diabetes mellitus, coronary artery disease with stable angina. I understand he had IV antibiotics, a CT scan with the head was done because of a fall and this was normal and a x-ray of the leg was done which showed no abnormality. Due to kidney disease his kidney doctor has taken him off his metformin and the patient's blood sugars have been running normal and his last hemoglobin A1c was 6 12/24/2014 -- the patient has had some redness of his lower limb but no fever or change in his diabetic control. He also has a blister come near the area where he had previous dressing applied. Electronic Signature(s) Signed: 01/07/2015 1:54:54 PM By: Christin Fudge MD, FACS Entered By: Christin Fudge on 01/07/2015 13:54:54 [name] ([id number]) -------------------------------------------------------------------------------- Physical Exam Details [name] [name] Date of Service: 01/07/2015 1:00 PM Patient Name: P. Patient Account Number: [id number] Medical Record Afful, RN, BSN, [id number] Treating RN: Number: Velva Harman Date of Birth/Sex: 05-02-1945 (69 y.o. Male) Other Clinician: [place], [place], [place] Physician: Physician/Extender: Referring Physician: Orland Mustard in Treatment: 3 Constitutional . Pulse regular. Respirations normal and unlabored. Afebrile. . Eyes Nonicteric. Reactive to light. Ears, Nose, Mouth, and Throat Lips, teeth, and gums WNL.Marland Kitchen Moist mucosa without lesions . Neck supple and nontender. No palpable supraclavicular or cervical adenopathy. Normal sized without goiter. Respiratory WNL. No retractions.. Cardiovascular Pedal Pulses WNL. No clubbing, cyanosis or edema. Chest Breasts symmetical and no  nipple discharge.. Breast tissue WNL, no masses, lumps, or tenderness.. Lymphatic No adneopathy. No adenopathy. No adenopathy. Musculoskeletal Adexa without tenderness or enlargement.. Digits and nails w/o clubbing, cyanosis, infection, petechiae, ischemia, or inflammatory conditions.. Integumentary (Hair, Skin) No suspicious lesions. No crepitus or fluctuance. No peri-wound warmth or erythema. No masses.Marland Kitchen Psychiatric  Judgement and insight Intact.. No evidence of depression, anxiety, or agitation.. Notes the wound has several areas with sutures of cut through and there is granulation tissue and some slough which will be cleansed out and sutures will be removed. Electronic Signature(s) Signed: 01/07/2015 1:55:32 PM By: Christin Fudge MD, FACS Entered By: Christin Fudge on 01/07/2015 13:55:32 [name] ([id number]) -------------------------------------------------------------------------------- Physician Orders Details [name] [name] Date of Service: 01/07/2015 1:00 PM Patient Name: P. Patient Account Number: [id number] Medical Record Afful, RN, BSN, [id number] Treating RN: Number: Velva Harman Date of Birth/Sex: 04-12-1946 (69 y.o. Male) Other Clinician: [place], [place], [place] Physician: Physician/Extender: Referring Physician: Orland Mustard in Treatment: 3 Verbal / Phone Orders: Yes Clinician: Afful, RN, BSN, Rita Read Back and Verified: Yes Diagnosis Coding Wound Cleansing Wound #1 Right,Anterior Lower Leg o Cleanse wound with mild soap and water o May Shower, gently pat wound dry prior to applying new dressing. o May shower with protection. Wound #2 Right,Posterior Lower Leg o Cleanse wound with mild soap and water o May Shower, gently pat wound dry prior to applying new dressing. o May shower with protection. Primary Wound Dressing Wound #1 Right,Anterior Lower Leg o Aquacel Ag Wound #2 Right,Posterior Lower Leg o  Aquacel Ag Secondary Dressing Wound #1 Right,Anterior Lower Leg o Gauze and Kerlix/Conform Wound #2 Right,Posterior Lower Leg o Gauze and Kerlix/Conform Dressing Change Frequency Wound #1 Right,Anterior Lower Leg o Change dressing every day. Wound #2 Right,Posterior Lower Leg o Change dressing every day. Follow-up Appointments JONATTAN, BECKHAM ([id number]) Wound #1 Right,Anterior Lower Leg o Return Appointment in 1 week. Wound #2 Right,Posterior Lower Leg o Return Appointment in 1 week. Edema Control Wound #1 Right,Anterior Lower Leg o Other: - light coban Wound #2 Right,Posterior Lower Leg o Other: - light coban Electronic Signature(s) Signed: 01/07/2015 1:30:54 PM By: Regan Lemming BSN, RN Signed: 01/07/2015 4:21:04 PM By: Christin Fudge MD, FACS Entered By: Regan Lemming on 01/07/2015 13:30:54 [name] ([id number]) -------------------------------------------------------------------------------- Problem List Details [name] [name] Date of Service: 01/07/2015 1:00 PM Patient Name: P. Patient Account Number: [id number] Medical Record Afful, RN, BSN, [id number] Treating RN: Number: Velva Harman Date of Birth/Sex: 29-May-1945 (69 y.o. Male) Other Clinician: [place], [place], [place] Physician: Physician/Extender: Referring Physician: Orland Mustard in Treatment: 3 Active Problems ICD-10 Encounter Code Description Active Date Diagnosis E11.621 Type 2 diabetes mellitus with foot ulcer 12/17/2014 Yes W54.0XXA Bitten by dog, initial encounter 12/17/2014 Yes S81.811A Laceration without foreign body, right lower leg, initial 12/17/2014 Yes encounter L97.212 Non-pressure chronic ulcer of right calf with fat layer 12/17/2014 Yes exposed Inactive Problems Resolved Problems Electronic Signature(s) Signed: 01/07/2015 1:53:51 PM By: Christin Fudge MD, FACS Entered By: Christin Fudge on 01/07/2015 13:53:51 [name]  ([id number]) -------------------------------------------------------------------------------- Progress Note Details [name] [name] Date of Service: 01/07/2015 1:00 PM Patient Name: P. Patient Account Number: [id number] Medical Record Afful, RN, BSN, [id number] Treating RN: Number: Velva Harman Date of Birth/Sex: [birth year] (69 y.o. Male) Other Clinician: [place], [place], [place] Physician: Physician/Extender: Referring Physician: Orland Mustard in Treatment: 3 Subjective Chief Complaint Information obtained from Patient Patient presents to the wound care center for a consult due non healing wound. He sustained a dog bite yesterday and went to the ER where first aid and suturing was done. History of Present Illness (HPI) The following HPI elements were documented for the patient's wound: Location: right lower extremity Quality: Patient reports experiencing a sharp pain to affected area(s). Severity: Patient states  wound (s) are getting better. Duration: Patient states the wound has been present for ____1_days. Timing: Pain in wound is constant (hurts all the time) Context: The wound occurred when the patient was bitten by a dog yesterday morning Modifying Factors: Consults to this date include:was seen in the ED and given IV antibiotics and suturing was done. Associated Signs and Symptoms: Patient reports presence of swelling 69 year old gentleman was seen in the ED on 12/16/2014 with a dog bite. No notes are available on Epic. Past medical history significant for essential hypertension, COPD, sleep apnea, nephrolithiasis, hyperlipidemia, obesity, diabetes mellitus, coronary artery disease with stable angina. I understand he had IV antibiotics, a CT scan with the head was done because of a fall and this was normal and a x-ray of the leg was done which showed no abnormality. Due to kidney disease his kidney doctor has taken him off his metformin and the  patient's blood sugars have been running normal and his last hemoglobin A1c was 6 12/24/2014 -- the patient has had some redness of his lower limb but no fever or change in his diabetic control. He also has a blister come near the area where he had previous dressing applied. Objective CARMELLO, TWING ([id number]) Constitutional Pulse regular. Respirations normal and unlabored. Afebrile. Vitals Time Taken: 1:14 PM, Height: 65 in, Weight: 218 lbs, BMI: 36.3, Temperature: 97.9 F, Pulse: 82 bpm, Respiratory Rate: 18 breaths/min, Blood Pressure: 140/59 mmHg. Eyes Nonicteric. Reactive to light. Ears, Nose, Mouth, and Throat Lips, teeth, and gums WNL.Marland Kitchen Moist mucosa without lesions . Neck supple and nontender. No palpable supraclavicular or cervical adenopathy. Normal sized without goiter. Respiratory WNL. No retractions.. Cardiovascular Pedal Pulses WNL. No clubbing, cyanosis or edema. Chest Breasts symmetical and no nipple discharge.. Breast tissue WNL, no masses, lumps, or tenderness.. Lymphatic No adneopathy. No adenopathy. No adenopathy. Musculoskeletal Adexa without tenderness or enlargement.. Digits and nails w/o clubbing, cyanosis, infection, petechiae, ischemia, or inflammatory conditions.Marland Kitchen Psychiatric Judgement and insight Intact.. No evidence of depression, anxiety, or agitation.. General Notes: the wound has several areas with sutures of cut through and there is granulation tissue and some slough which will be cleansed out and sutures will be removed. Integumentary (Hair, Skin) No suspicious lesions. No crepitus or fluctuance. No peri-wound warmth or erythema. No masses.. Wound #1 status is Open. Original cause of wound was Trauma. The wound is located on the Right,Anterior Lower Leg. The wound measures 10cm length x 9cm width x 0.2cm depth; 70.686cm^2 area and 14.137cm^3 volume. The wound is limited to skin breakdown. There is no tunneling or undermining noted. There  is a small amount of serous drainage noted. The wound margin is flat and intact. There is medium (34-66%) red, pink granulation within the wound bed. There is a medium (34-66%) amount of necrotic tissue within the wound bed including Eschar and Adherent Slough. The periwound skin appearance exhibited: Localized Edema, Moist. The periwound skin appearance did not exhibit: Callus, Crepitus, Excoriation, Fluctuance, Friable, Induration, Rash, Scarring, Dry/Scaly, Maceration, Atrophie ZAKHARI, MICHNIEWICZ. ([id number]) Blanche, Cyanosis, Ecchymosis, Hemosiderin Staining, Mottled, Pallor, Rubor, Erythema. Periwound temperature was noted as No Abnormality. The periwound has tenderness on palpation. Wound #2 status is Open. Original cause of wound was Trauma. The wound is located on the Right,Posterior Lower Leg. The wound measures 6cm length x 7cm width x 0.2cm depth; 32.987cm^2 area and 6.597cm^3 volume. The wound is limited to skin breakdown. There is no tunneling or undermining noted. There is a small amount of serous drainage noted.  The wound margin is flat and intact. There is medium (34-66%) red granulation within the wound bed. There is a medium (34-66%) amount of necrotic tissue within the wound bed including Eschar and Adherent Slough. The periwound skin appearance exhibited: Dry/Scaly, Moist. The periwound skin appearance did not exhibit: Callus, Crepitus, Excoriation, Fluctuance, Friable, Induration, Localized Edema, Rash, Scarring, Maceration, Atrophie Blanche, Cyanosis, Ecchymosis, Hemosiderin Staining, Mottled, Pallor, Rubor, Erythema. Periwound temperature was noted as No Abnormality. the sutures need to be all removed and I have done this. Some of the areas have some slough and debris washed out with saline gauze. Assessment Active Problems ICD-10 E11.621 - Type 2 diabetes mellitus with foot ulcer W54.0XXA - Bitten by dog, initial encounter [phone] - Laceration without foreign  body, right lower leg, initial encounter [phone] - Non-pressure chronic ulcer of right calf with fat layer exposed Having removed all the sutures at this stage we will apply silver alginate and a light compression wrap with Kerlix and Coban. He will continue with elevation of the limb and good control of his diabetes mellitus. Plan Wound Cleansing: Wound #1 Right,Anterior Lower Leg: Cleanse wound with mild soap and water May Shower, gently pat wound dry prior to applying new dressing. May shower with protection. Wound #2 Right,Posterior Lower Leg: Cleanse wound with mild soap and water May Shower, gently pat wound dry prior to applying new dressing. BENIGNO, MOSENG ([id number]) May shower with protection. Primary Wound Dressing: Wound #1 Right,Anterior Lower Leg: Aquacel Ag Wound #2 Right,Posterior Lower Leg: Aquacel Ag Secondary Dressing: Wound #1 Right,Anterior Lower Leg: Gauze and Kerlix/Conform Wound #2 Right,Posterior Lower Leg: Gauze and Kerlix/Conform Dressing Change Frequency: Wound #1 Right,Anterior Lower Leg: Change dressing every day. Wound #2 Right,Posterior Lower Leg: Change dressing every day. Follow-up Appointments: Wound #1 Right,Anterior Lower Leg: Return Appointment in 1 week. Wound #2 Right,Posterior Lower Leg: Return Appointment in 1 week. Edema Control: Wound #1 Right,Anterior Lower Leg: Other: - light coban Wound #2 Right,Posterior Lower Leg: Other: - light coban Having removed all the sutures at this stage we will apply silver alginate and a light compression wrap with Kerlix and Coban. He will continue with elevation of the limb and good control of his diabetes mellitus. Electronic Signature(s) Signed: 01/07/2015 1:56:46 PM By: Christin Fudge MD, FACS Entered By: Christin Fudge on 01/07/2015 13:56:46 [name] ([id number]) -------------------------------------------------------------------------------- Glory Buff  Details [name] [name] Date of Service: 01/07/2015 Patient Name: P. Patient Account Number: [id number] Medical Record Afful, RN, BSN, [id number] Treating RN: Number: Velva Harman Date of Birth/Sex: 08-12-1945 (69 y.o. Male) Other Clinician: [place], [place], [place] Physician: Physician/Extender: Referring Physician: Orland Mustard in Treatment: 3 Diagnosis Coding ICD-10 Codes Code Description E11.621 Type 2 diabetes mellitus with foot ulcer W54.0XXA Bitten by dog, initial encounter [phone] Laceration without foreign body, right lower leg, initial encounter L97.212 Non-pressure chronic ulcer of right calf with fat layer exposed Facility Procedures CPT4 Code: [id number] Description: 99213 - WOUND CARE VISIT-LEV 3 EST PT Modifier: Quantity: 1 Physician Procedures CPT4 Code Description: [id number] - WC PHYS LEVEL 3 - EST PT ICD-10 Description Diagnosis E11.621 Type 2 diabetes mellitus with foot ulcer W54.0XXA Bitten by dog, initial encounter [phone] Laceration without foreign body, right lower leg, init  L97.212 Non-pressure chronic ulcer of right calf with fat laye Modifier: ial encounter r exposed Quantity: 1 Electronic Signature(s) Signed: 01/07/2015 1:57:00 PM By: Christin Fudge MD, FACS Entered By: Christin Fudge on 01/07/2015 13:57:00

## 2015-01-14 ENCOUNTER — Encounter: Payer: Medicare HMO | Admitting: Surgery

## 2015-01-14 DIAGNOSIS — L97212 Non-pressure chronic ulcer of right calf with fat layer exposed: Secondary | ICD-10-CM | POA: Diagnosis not present

## 2015-01-15 NOTE — Progress Notes (Signed)
[name] [name], [name] [name] ([id number]) Visit Report for 01/14/2015 Chief Complaint Document Details BRINTON, GLENDENING 01/14/2015 1:00 Patient Name: Date of Service: P. PM Medical Record Patient Account Number: [id number] Number: Treating RN: Montey Hora Date of Birth/Sex: Dec 18, 1945 (69 y.o. Male) Other Clinician: Primary Care Physician: Lamonte Sakai Treating Christin Fudge Referring Physician: Lamonte Sakai Physician/Extender: Suella Grove in Treatment: 4 Information Obtained from: Patient Chief Complaint Patient presents to the wound care center for a consult due non healing wound. He sustained a dog bite yesterday and went to the ER where first aid and suturing was done. Electronic Signature(s) Signed: 01/14/2015 1:56:27 PM By: Christin Fudge MD, FACS Entered By: Christin Fudge on 01/14/2015 13:56:26 [name] ([id number]) -------------------------------------------------------------------------------- Debridement Details SAKARIYE, YUN 01/14/2015 1:00 Patient Name: Date of Service: P. PM Medical Record Patient Account Number: [id number] Number: Treating RN: Montey Hora Date of Birth/Sex: Aug 21, 1945 (69 y.o. Male) Other Clinician: Primary Care Physician: Lamonte Sakai Treating Christin Fudge Referring Physician: Lamonte Sakai Physician/Extender: Suella Grove in Treatment: 4 Debridement Performed for Wound #1 Right,Anterior Lower Leg Assessment: Performed By: Physician Pat Patrick., MD Debridement: Debridement Pre-procedure Yes Verification/Time Out Taken: Start Time: 13:36 Pain Control: Lidocaine 4% Topical Solution Level: Skin/Subcutaneous Tissue Total Area Debrided (L x 3 (cm) x 5 (cm) = 15 (cm) W): Tissue and other Viable, Non-Viable, Eschar, Fibrin/Slough, Subcutaneous material debrided: Instrument: Forceps Bleeding: Minimum Hemostasis Achieved: Pressure End Time: 13:40 Procedural Pain: 0 Post Procedural Pain: 0 Response to  Treatment: Procedure was tolerated well Post Debridement Measurements of Total Wound Length: (cm) 6.5 Width: (cm) 9 Depth: (cm) 0.2 Volume: (cm) 9.189 Post Procedure Diagnosis Same as Pre-procedure Electronic Signature(s) Signed: 01/14/2015 1:56:20 PM By: Christin Fudge MD, FACS Signed: 01/14/2015 4:22:28 PM By: Montey Hora Entered By: Christin Fudge on 01/14/2015 13:56:20 [name] ([id number]) -------------------------------------------------------------------------------- HPI Details TRENTYN, EKHOLM 01/14/2015 1:00 Patient Name: Date of Service: P. PM Medical Record Patient Account Number: [id number] Number: Treating RN: Montey Hora Date of Birth/Sex: [birth year] (69 y.o. Male) Other Clinician: Primary Care Physician: Lamonte Sakai Treating Christin Fudge Referring Physician: Lamonte Sakai Physician/Extender: Suella Grove in Treatment: 4 History of Present Illness Location: right lower extremity Quality: Patient reports experiencing a sharp pain to affected area(s). Severity: Patient states wound (s) are getting better. Duration: Patient states the wound has been present for ____1_days. Timing: Pain in wound is constant (hurts all the time) Context: The wound occurred when the patient was bitten by a dog yesterday morning Modifying Factors: Consults to this date include:was seen in the ED and given IV antibiotics and suturing was done. Associated Signs and Symptoms: Patient reports presence of swelling HPI Description: 69 year old gentleman was seen in the ED on 12/16/2014 with a dog bite. No notes are available on Epic. Past medical history significant for essential hypertension, COPD, sleep apnea, nephrolithiasis, hyperlipidemia, obesity, diabetes mellitus, coronary artery disease with stable angina. I understand he had IV antibiotics, a CT scan with the head was done because of a fall and this was normal and a x-ray of the leg was done which showed  no abnormality. Due to kidney disease his kidney doctor has taken him off his metformin and the patient's blood sugars have been running normal and his last hemoglobin A1c was 6 12/24/2014 -- the patient has had some redness of his lower limb but no fever or change in his diabetic control. He also has a blister come near the area where he had previous dressing applied. Electronic Signature(s) Signed: 01/14/2015 1:56:32 PM By: Con Memos,  Roderick Pee MD, FACS Entered By: Christin Fudge on 01/14/2015 13:56:32 [name] ([id number]) -------------------------------------------------------------------------------- Physical Exam Details TRESEAN, TRAME 01/14/2015 1:00 Patient Name: Date of Service: P. PM Medical Record Patient Account Number: [id number] Number: Treating RN: Montey Hora Date of Birth/Sex: 1945-09-21 (69 y.o. Male) Other Clinician: Primary Care Physician: Lamonte Sakai Treating Christin Fudge Referring Physician: Lamonte Sakai Physician/Extender: Suella Grove in Treatment: 4 Constitutional . Pulse regular. Respirations normal and unlabored. Afebrile. . Eyes Nonicteric. Reactive to light. Ears, Nose, Mouth, and Throat Lips, teeth, and gums WNL.Marland Kitchen Moist mucosa without lesions . Neck supple and nontender. No palpable supraclavicular or cervical adenopathy. Normal sized without goiter. Respiratory WNL. No retractions.. Cardiovascular Pedal Pulses WNL. No clubbing, cyanosis or edema. Lymphatic No adneopathy. No adenopathy. No adenopathy. Musculoskeletal Adexa without tenderness or enlargement.. Digits and nails w/o clubbing, cyanosis, infection, petechiae, ischemia, or inflammatory conditions.. Integumentary (Hair, Skin) No suspicious lesions. No crepitus or fluctuance. No peri-wound warmth or erythema. No masses.Marland Kitchen Psychiatric Judgement and insight Intact.. No evidence of depression, anxiety, or agitation.. Notes The area which is covered with slough has been  sharply debrided and once this has been done there is healthy granulation tissue in the depths. Electronic Signature(s) Signed: 01/14/2015 1:57:07 PM By: Christin Fudge MD, FACS Entered By: Christin Fudge on 01/14/2015 13:57:06 [name] ([id number]) -------------------------------------------------------------------------------- Physician Orders Details JAMIEN, KORAB 01/14/2015 1:00 Patient Name: Date of Service: P. PM Medical Record Patient Account Number: [id number] Number: Treating RN: Montey Hora Date of Birth/Sex: 1945-05-08 (68 y.o. Male) Other Clinician: Primary Care Physician: Lamonte Sakai Treating Christin Fudge Referring Physician: Lamonte Sakai Physician/Extender: Suella Grove in Treatment: 4 Verbal / Phone Orders: Yes Clinician: Montey Hora Read Back and Verified: Yes Diagnosis Coding Wound Cleansing Wound #1 Right,Anterior Lower Leg o Cleanse wound with mild soap and water o May Shower, gently pat wound dry prior to applying new dressing. o May shower with protection. Wound #2 Right,Posterior Lower Leg o Cleanse wound with mild soap and water o May Shower, gently pat wound dry prior to applying new dressing. o May shower with protection. Primary Wound Dressing Wound #1 Right,Anterior Lower Leg o Aquacel Ag Wound #2 Right,Posterior Lower Leg o Aquacel Ag Secondary Dressing Wound #1 Right,Anterior Lower Leg o Contact Layer o Gauze and Kerlix/Conform - coban lightly Wound #2 Right,Posterior Lower Leg o Contact Layer o Gauze and Kerlix/Conform - coban lightly Dressing Change Frequency Wound #1 Right,Anterior Lower Leg o Change dressing every day. Wound #2 Right,Posterior Lower Leg o Change dressing every day. ULRICK, ALBERTA ([id number]) Follow-up Appointments Wound #1 Right,Anterior Lower Leg o Return Appointment in 1 week. Wound #2 Right,Posterior Lower Leg o Return Appointment in 1  week. Edema Control Wound #1 Right,Anterior Lower Leg o Other: - light coban Wound #2 Right,Posterior Lower Leg o Other: - light coban Electronic Signature(s) Signed: 01/14/2015 3:44:09 PM By: Christin Fudge MD, FACS Signed: 01/14/2015 4:22:28 PM By: Montey Hora Entered By: Montey Hora on 01/14/2015 13:41:46 [name] ([id number]) -------------------------------------------------------------------------------- Problem List Details KEAON, SCHLOUGH 01/14/2015 1:00 Patient Name: Date of Service: P. PM Medical Record Patient Account Number: [id number] Number: Treating RN: Montey Hora Date of Birth/Sex: 1945-10-01 (68 y.o. Male) Other Clinician: Primary Care Physician: Lamonte Sakai Treating Christin Fudge Referring Physician: Lamonte Sakai Physician/Extender: Suella Grove in Treatment: 4 Active Problems ICD-10 Encounter Code Description Active Date Diagnosis E11.621 Type 2 diabetes mellitus with foot ulcer 12/17/2014 Yes W54.0XXA Bitten by dog, initial encounter 12/17/2014 Yes S81.811A Laceration without foreign body, right lower leg, initial  12/17/2014 Yes encounter L97.212 Non-pressure chronic ulcer of right calf with fat layer 12/17/2014 Yes exposed Inactive Problems Resolved Problems Electronic Signature(s) Signed: 01/14/2015 1:56:04 PM By: Christin Fudge MD, FACS Entered By: Christin Fudge on 01/14/2015 13:56:03 [name] ([id number]) -------------------------------------------------------------------------------- Progress Note Details IBRAHEEM, FERRALES 01/14/2015 1:00 Patient Name: Date of Service: P. PM Medical Record Patient Account Number: [id number] Number: Treating RN: Montey Hora Date of Birth/Sex: 1946/02/02 (68 y.o. Male) Other Clinician: Primary Care Physician: Lamonte Sakai Treating Christin Fudge Referring Physician: Lamonte Sakai Physician/Extender: Suella Grove in Treatment: 4 Subjective Chief  Complaint Information obtained from Patient Patient presents to the wound care center for a consult due non healing wound. He sustained a dog bite yesterday and went to the ER where first aid and suturing was done. History of Present Illness (HPI) The following HPI elements were documented for the patient's wound: Location: right lower extremity Quality: Patient reports experiencing a sharp pain to affected area(s). Severity: Patient states wound (s) are getting better. Duration: Patient states the wound has been present for ____1_days. Timing: Pain in wound is constant (hurts all the time) Context: The wound occurred when the patient was bitten by a dog yesterday morning Modifying Factors: Consults to this date include:was seen in the ED and given IV antibiotics and suturing was done. Associated Signs and Symptoms: Patient reports presence of swelling 69 year old gentleman was seen in the ED on 12/16/2014 with a dog bite. No notes are available on Epic. Past medical history significant for essential hypertension, COPD, sleep apnea, nephrolithiasis, hyperlipidemia, obesity, diabetes mellitus, coronary artery disease with stable angina. I understand he had IV antibiotics, a CT scan with the head was done because of a fall and this was normal and a x-ray of the leg was done which showed no abnormality. Due to kidney disease his kidney doctor has taken him off his metformin and the patient's blood sugars have been running normal and his last hemoglobin A1c was 6 12/24/2014 -- the patient has had some redness of his lower limb but no fever or change in his diabetic control. He also has a blister come near the area where he had previous dressing applied. Objective Constitutional EDUARDO, MENGE. ([id number]) Pulse regular. Respirations normal and unlabored. Afebrile. Vitals Time Taken: 1:11 PM, Height: 65 in, Weight: 218 lbs, BMI: 36.3, Temperature: 98.2 F, Pulse: 82 bpm, Respiratory  Rate: 18 breaths/min, Blood Pressure: 115/57 mmHg. Eyes Nonicteric. Reactive to light. Ears, Nose, Mouth, and Throat Lips, teeth, and gums WNL.Marland Kitchen Moist mucosa without lesions . Neck supple and nontender. No palpable supraclavicular or cervical adenopathy. Normal sized without goiter. Respiratory WNL. No retractions.. Cardiovascular Pedal Pulses WNL. No clubbing, cyanosis or edema. Lymphatic No adneopathy. No adenopathy. No adenopathy. Musculoskeletal Adexa without tenderness or enlargement.. Digits and nails w/o clubbing, cyanosis, infection, petechiae, ischemia, or inflammatory conditions.Marland Kitchen Psychiatric Judgement and insight Intact.. No evidence of depression, anxiety, or agitation.. General Notes: The area which is covered with slough has been sharply debrided and once this has been done there is healthy granulation tissue in the depths. Integumentary (Hair, Skin) No suspicious lesions. No crepitus or fluctuance. No peri-wound warmth or erythema. No masses.. Wound #1 status is Open. Original cause of wound was Trauma. The wound is located on the Right,Anterior Lower Leg. The wound measures 6.5cm length x 9cm width x 0.2cm depth; 45.946cm^2 area and 9.189cm^3 volume. The wound is limited to skin breakdown. There is no tunneling noted. There is a medium amount of serous drainage noted. The  wound margin is flat and intact. There is medium (34-66%) red, pink granulation within the wound bed. There is a medium (34-66%) amount of necrotic tissue within the wound bed including Eschar and Adherent Slough. The periwound skin appearance exhibited: Localized Edema, Moist. The periwound skin appearance did not exhibit: Callus, Crepitus, Excoriation, Fluctuance, Friable, Induration, Rash, Scarring, Dry/Scaly, Maceration, Atrophie Blanche, Cyanosis, Ecchymosis, Hemosiderin Staining, Mottled, Pallor, Rubor, Erythema. Periwound temperature was noted as No Abnormality. The periwound has tenderness on  palpation. Wound #2 status is Open. Original cause of wound was Trauma. The wound is located on the Right,Posterior Lower Leg. The wound measures 3.5cm length x 2.9cm width x 0.2cm depth; 7.972cm^2 AMERION, THONG P. ([id number]) area and 1.594cm^3 volume. The wound is limited to skin breakdown. There is no tunneling or undermining noted. There is a small amount of serous drainage noted. The wound margin is flat and intact. There is medium (34-66%) red granulation within the wound bed. There is a medium (34-66%) amount of necrotic tissue within the wound bed including Eschar and Adherent Slough. The periwound skin appearance exhibited: Dry/Scaly, Moist. The periwound skin appearance did not exhibit: Callus, Crepitus, Excoriation, Fluctuance, Friable, Induration, Localized Edema, Rash, Scarring, Maceration, Atrophie Blanche, Cyanosis, Ecchymosis, Hemosiderin Staining, Mottled, Pallor, Rubor, Erythema. Periwound temperature was noted as No Abnormality. Assessment Active Problems ICD-10 E11.621 - Type 2 diabetes mellitus with foot ulcer W54.0XXA - Bitten by dog, initial encounter [phone] - Laceration without foreign body, right lower leg, initial encounter [phone] - Non-pressure chronic ulcer of right calf with fat layer exposed I have recommended we continue with silver alginate and a bordered foam dressing with a light compression over this. He is urged to elevate his limb as much as possible to reduce the edema. Procedures Wound #1 Wound #1 is a Trauma, Other located on the Right,Anterior Lower Leg . There was a Skin/Subcutaneous Tissue Debridement [id number]) debridement with total area of 15 sq cm performed by Johanna Matto, Jackson Latino., MD. with the following instrument(s): Forceps to remove Viable and Non-Viable tissue/material including Fibrin/Slough, Eschar, and Subcutaneous after achieving pain control using Lidocaine 4% Topical Solution. A time out was conducted prior to the start of  the procedure. A Minimum amount of bleeding was controlled with Pressure. The procedure was tolerated well with a pain level of 0 throughout and a pain level of 0 following the procedure. Post Debridement Measurements: 6.5cm length x 9cm width x 0.2cm depth; 9.189cm^3 volume. Post procedure Diagnosis Wound #1: Same as Pre-Procedure ZAKORY, ZUCCONI ([id number]) Plan Wound Cleansing: Wound #1 Right,Anterior Lower Leg: Cleanse wound with mild soap and water May Shower, gently pat wound dry prior to applying new dressing. May shower with protection. Wound #2 Right,Posterior Lower Leg: Cleanse wound with mild soap and water May Shower, gently pat wound dry prior to applying new dressing. May shower with protection. Primary Wound Dressing: Wound #1 Right,Anterior Lower Leg: Aquacel Ag Wound #2 Right,Posterior Lower Leg: Aquacel Ag Secondary Dressing: Wound #1 Right,Anterior Lower Leg: Contact Layer Gauze and Kerlix/Conform - coban lightly Wound #2 Right,Posterior Lower Leg: Contact Layer Gauze and Kerlix/Conform - coban lightly Dressing Change Frequency: Wound #1 Right,Anterior Lower Leg: Change dressing every day. Wound #2 Right,Posterior Lower Leg: Change dressing every day. Follow-up Appointments: Wound #1 Right,Anterior Lower Leg: Return Appointment in 1 week. Wound #2 Right,Posterior Lower Leg: Return Appointment in 1 week. Edema Control: Wound #1 Right,Anterior Lower Leg: Other: - light coban Wound #2 Right,Posterior Lower Leg: Other: - light coban I have recommended we  continue with silver alginate and a bordered foam dressing with a light compression over this. He is urged to elevate his limb as much as possible to reduce the edema. HARMONY, ROSSNER ([id number]) Electronic Signature(s) Signed: 01/14/2015 1:57:51 PM By: Christin Fudge MD, FACS Entered By: Christin Fudge on 01/14/2015 13:57:51 DEJOHN, HOPSON  ([id number]) -------------------------------------------------------------------------------- SuperBill Details Patient Name: [name]. Date of Service: 01/14/2015 Medical Record Number: [id number] Patient Account Number: [id number] Date of Birth/Sex: Sep 20, 1945 (69 y.o. Male) Treating RN: Montey Hora Primary Care Physician: Lamonte Sakai Other Clinician: Referring Physician: Lamonte Sakai Treating Physician/Extender: Frann Rider in Treatment: 4 Diagnosis Coding ICD-10 Codes Code Description E11.621 Type 2 diabetes mellitus with foot ulcer W54.0XXA Bitten by dog, initial encounter [phone] Laceration without foreign body, right lower leg, initial encounter L97.212 Non-pressure chronic ulcer of right calf with fat layer exposed Facility Procedures CPT4 Code Description: [id number] - DEB SUBQ TISSUE 20 SQ CM/< ICD-10 Description Diagnosis E11.621 Type 2 diabetes mellitus with foot ulcer S81.811A Laceration without foreign body, right lower leg, init L97.212 Non-pressure chronic ulcer of right  calf with fat laye W54.0XXA Bitten by dog, initial encounter Modifier: ial encounte r exposed Quantity: 1 r Physician Procedures CPT4 Code Description: [id number] - WC PHYS SUBQ TISS 20 SQ CM ICD-10 Description Diagnosis E11.621 Type 2 diabetes mellitus with foot ulcer S81.811A Laceration without foreign body, right lower leg, init L97.212 Non-pressure chronic ulcer of right  calf with fat laye W54.0XXA Bitten by dog, initial encounter Modifier: ial encounte r exposed Quantity: 1 r Electronic Signature(s) Signed: 01/14/2015 1:58:07 PM By: Christin Fudge MD, FACS Entered By: Christin Fudge on 01/14/2015 13:58:07

## 2015-01-15 NOTE — Progress Notes (Signed)
[name] [name] ([id number]) Visit Report for 01/14/2015 Arrival Information Details Patient Name: [name] [name]. Date of Service: 01/14/2015 1:00 PM Medical Record Number: [id number] Patient Account Number: [id number] Date of Birth/Sex: Feb 21, 1946 (69 y.o. Male) Treating RN: Montey Hora Primary Care Physician: Lamonte Sakai Other Clinician: Referring Physician: Lamonte Sakai Treating Physician/Extender: Frann Rider in Treatment: 4 Visit Information History Since Last Visit Added or deleted any medications: No Patient Arrived: Ambulatory Any new allergies or adverse reactions: No Arrival Time: 13:09 Had a fall or experienced change in No Accompanied By: self activities of daily living that may affect Transfer Assistance: None risk of falls: Patient Identification Verified: Yes Signs or symptoms of abuse/neglect since last No Secondary Verification Process Yes visito Completed: Hospitalized since last visit: No Patient Has Alerts: Yes Pain Present Now: No Patient Alerts: DMII aspirin 81 ABI L 1.25 NO ABI R R/T wound Electronic Signature(s) Signed: 01/14/2015 4:22:28 PM By: Montey Hora Entered By: Montey Hora on 01/14/2015 13:11:07 [name] [name] ([id number]) -------------------------------------------------------------------------------- Encounter Discharge Information Details Patient Name: [name] [name]. Date of Service: 01/14/2015 1:00 PM Medical Record Number: [id number] Patient Account Number: [id number] Date of Birth/Sex: 01/21/1946 (69 y.o. Male) Treating RN: Montey Hora Primary Care Physician: Lamonte Sakai Other Clinician: Referring Physician: Lamonte Sakai Treating Physician/Extender: Frann Rider in Treatment: 4 Encounter Discharge Information Items Discharge Pain Level: 0 Discharge Condition: Stable Ambulatory Status: Ambulatory Discharge Destination: Home Transportation: Private Auto Accompanied By:  self Schedule Follow-up Appointment: Yes Medication Reconciliation completed and provided to Patient/Care No Provider: Provided on Clinical Summary of Care: 01/14/2015 Form Type Recipient Paper Patient WB Electronic Signature(s) Signed: 01/14/2015 1:53:29 PM By: Ruthine Dose Entered By: Ruthine Dose on 01/14/2015 13:53:29 [name] [name] ([id number]) -------------------------------------------------------------------------------- Lower Extremity Assessment Details Patient Name: [name] [name]. Date of Service: 01/14/2015 1:00 PM Medical Record Number: [id number] Patient Account Number: [id number] Date of Birth/Sex: 10-17-1945 (69 y.o. Male) Treating RN: Montey Hora Primary Care Physician: Lamonte Sakai Other Clinician: Referring Physician: Lamonte Sakai Treating Physician/Extender: Frann Rider in Treatment: 4 Edema Assessment Assessed: [Left: No] [Right: No] E[Left: dema] [Right: :] Calf Left: Right: Point of Measurement: 32 cm From Medial Instep cm 38.7 cm Ankle Left: Right: Point of Measurement: 10 cm From Medial Instep cm 22.6 cm Vascular Assessment Pulses: Posterior Tibial Dorsalis Pedis Palpable: [Right:Yes] Extremity colors, hair growth, and conditions: Extremity Color: [Right:Mottled] Hair Growth on Extremity: [Right:Yes] Temperature of Extremity: [Right:Warm] Capillary Refill: [Right:< 3 seconds] Electronic Signature(s) Signed: 01/14/2015 4:22:28 PM By: Montey Hora Entered By: Montey Hora on 01/14/2015 13:20:57 [name] [name] ([id number]) -------------------------------------------------------------------------------- Multi Wound Chart Details Patient Name: [name] [name]. Date of Service: 01/14/2015 1:00 PM Medical Record Number: [id number] Patient Account Number: [id number] Date of Birth/Sex: 05/18/1945 (69 y.o. 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594] [N/A:N/A] % Reduction in Area: [1:-30.00%] [2:-915.50%] [N/A:N/A] % Reduction in Volume: -30.00% [2:-915.30%] [N/A:N/A] Classification: [1:Full Thickness Without Exposed Support Structures] [2:Full Thickness Without Exposed Support Structures] [N/A:N/A] HBO Classification: [1:Grade 1] [2:Grade 1] [N/A:N/A] Exudate Amount: [1:Medium] [2:Small] [N/A:N/A] Exudate Type: [1:Serous] [2:Serous] [N/A:N/A] Exudate Color: [1:amber] [2:amber] [N/A:N/A] Wound Margin: [1:Flat and Intact] [2:Flat and Intact] [N/A:N/A] Granulation Amount: Medium (34-66%) Medium (34-66%) N/A Granulation Quality: Red, Pink Red N/A Necrotic Amount: Medium (34-66%) Medium (34-66%) N/A Necrotic Tissue: Eschar, Adherent Slough  Eschar, Adherent Slough N/A Exposed Structures: Fascia: No Fascia: No N/A Fat: No Fat: No Tendon: No Tendon: No Muscle: No Muscle: No Joint: No Joint: No Bone: No Bone: No Limited to Skin Limited to Skin Breakdown Breakdown Epithelialization: None None N/A Periwound Skin Texture: Edema: Yes Edema: No N/A Excoriation: No Excoriation: No Induration: No Induration: No Callus: No Callus: No Crepitus: No Crepitus: No Fluctuance: No Fluctuance: No Friable: No Friable: No Rash: No Rash: No Scarring: No Scarring: No Periwound Skin Moist: Yes Moist: Yes N/A Moisture: Maceration: No Dry/Scaly: Yes Dry/Scaly: No Maceration: No Periwound Skin Color: Atrophie Blanche: No Atrophie Blanche: No N/A Cyanosis: No Cyanosis: No Ecchymosis: No Ecchymosis: No Erythema: No Erythema: No Hemosiderin Staining: No Hemosiderin Staining: No Mottled: No Mottled: No Pallor: No Pallor: No Rubor: No Rubor: No Temperature: No Abnormality No Abnormality N/A Tenderness on Yes No N/A Palpation: Wound Preparation: Ulcer Cleansing: Ulcer Cleansing: N/A Rinsed/Irrigated with Rinsed/Irrigated with Saline Saline Topical Anesthetic Topical Anesthetic Applied: Other: lidocaine Applied: Other: lidocaine 4% 4% Treatment Notes Electronic Signature(s) Signed: 01/14/2015 4:22:28 PM By: Montey Hora Entered By: Montey Hora on 01/14/2015 13:24:10 [name] [name], [name] [name] ([id number]) [name] [name], [name] [name] ([id number]) -------------------------------------------------------------------------------- Multi-Disciplinary Care Plan Details Patient Name: ALBARA, SCORZA. Date of Service: 01/14/2015 1:00 PM Medical Record Number: [id number] Patient Account Number: [id number] Date of Birth/Sex: [birth year] (69 y.o. Male) Treating RN: Montey Hora Primary Care Physician: Lamonte Sakai Other Clinician: Referring Physician: Lamonte Sakai Treating Physician/Extender: Frann Rider in  Treatment: 4 Active Inactive Abuse / Safety / Falls / Self Care Management Nursing Diagnoses: Potential for falls Goals: Patient will remain injury free Date Initiated: 12/17/2014 Goal Status: Active Interventions: Assess fall risk on admission and as needed Notes: Orientation to the Wound Care Program Nursing Diagnoses: Knowledge deficit related to the wound healing center program Goals: Patient/caregiver will verbalize understanding of the [place] Program Date Initiated: 12/17/2014 Goal Status: Active Interventions: Provide education on orientation to the wound center Notes: Wound/Skin Impairment Nursing Diagnoses: Impaired tissue integrity Goals: Ulcer/skin breakdown will have a volume reduction of 30% by week 4 Date Initiated: 12/17/2014 [name] [name], [name] [name] ([id number]) Goal Status: Active Ulcer/skin breakdown will have a volume reduction of 50% by week 8 Date Initiated: 12/17/2014 Goal Status: Active Ulcer/skin breakdown will have a volume reduction of 80% by week 12 Date Initiated: 12/17/2014 Goal Status: Active Ulcer/skin breakdown will heal within 14 weeks Date Initiated: 12/17/2014 Goal Status: Active Interventions: Assess ulceration(s) every visit Notes: Electronic Signature(s) Signed: 01/14/2015 4:22:28 PM By: Montey Hora Entered By: Montey Hora on 01/14/2015 13:24:01 [name] [name] ([id number]) -------------------------------------------------------------------------------- Patient/Caregiver Education Details Patient Name: [name] [name]. Date of Service: 01/14/2015 1:00 PM Medical Record Number: [id number] Patient Account Number: [id number] Date of Birth/Gender: [birth year] (69 y.o. Male) Treating RN: Montey Hora Primary Care  Physician: Lamonte Sakai Other Clinician: Referring Physician: Lamonte Sakai Treating Physician/Extender: Frann Rider in Treatment: 4 Education Assessment Education Provided  To: Patient Education Topics Provided Wound/Skin Impairment: Handouts: Other: wound care as ordered Methods: Demonstration, Explain/Verbal Responses: State content correctly Electronic Signature(s) Signed: 01/14/2015 4:22:28 PM By: Montey Hora Entered By: Montey Hora on 01/14/2015 13:26:41 [name] [name] ([id number]) -------------------------------------------------------------------------------- Wound Assessment Details Patient Name: [name] [name]. Date of Service: 01/14/2015 1:00 PM Medical Record Number: [id number] Patient Account Number: [id number] Date of Birth/Sex: [birth year] (69 y.o. Male) Treating RN: Montey Hora Primary Care Physician: Lamonte Sakai Other Clinician: Referring Physician: Lamonte Sakai Treating Physician/Extender: Frann Rider in Treatment: 4 Wound Status Wound Number: 1 Primary Trauma, Other Etiology: Wound Location: Right Lower Leg - Anterior Secondary Diabetic Wound/Ulcer of the Lower Wounding Event: Trauma Etiology: Extremity Date Acquired: 12/16/2014 Wound Open Weeks Of Treatment: 4 Status: Clustered Wound: No Notes: Patient bitten by a dog yesterday, went to the ER and 9 stitches used to suture skin flap together. Comorbid Chronic Obstructive Pulmonary History: Disease (COPD), Coronary Artery Disease, Hypertension, Type II Diabetes, Osteoarthritis, Neuropathy Photos Photo Uploaded By: Montey Hora on 01/14/2015 14:08:19 Wound Measurements Length: (cm) 6.5 Width: (cm) 9 Depth: (cm) 0.2 Area: (cm) 45.946 Volume: (cm) 9.189 % Reduction in Area: -30% % Reduction in Volume: -30% Epithelialization: None Tunneling: No Wound Description Full Thickness Without Foul Odor After Classification: Exposed Support Structures Diabetic Severity Grade 1 (Wagner): Wound Margin: Flat and Intact [name] [name], [name] [name] ([id number]) Cleansing: No Exudate Amount: Medium Exudate Type: Serous Exudate Color:  amber Wound Bed Granulation Amount: Medium (34-66%) Exposed Structure Granulation Quality: Red, Pink Fascia Exposed: No Necrotic Amount: Medium (34-66%) Fat Layer Exposed: No Necrotic Quality: Eschar, Adherent Slough Tendon Exposed: No Muscle Exposed: No Joint Exposed: No Bone Exposed: No Limited to Skin Breakdown Periwound Skin Texture Texture Color No Abnormalities Noted: No No Abnormalities Noted: No Callus: No Atrophie Blanche: No Crepitus: No Cyanosis: No Excoriation: No Ecchymosis: No Fluctuance: No Erythema: No Friable: No Hemosiderin Staining: No Induration: No Mottled: No Localized Edema: Yes Pallor: No Rash: No Rubor: No Scarring: No Temperature / Pain Moisture Temperature: No Abnormality No Abnormalities Noted: No Tenderness on Palpation: Yes Dry / Scaly: No Maceration: No Moist: Yes Wound Preparation Ulcer Cleansing: Rinsed/Irrigated with Saline Topical Anesthetic Applied: Other: lidocaine 4%, Treatment Notes Wound #1 (Right, Anterior Lower Leg) 1. Cleansed with: Clean wound with Normal Saline 2. Anesthetic Topical Lidocaine 4% cream to wound bed prior to debridement 4. Dressing Applied: Aquacel Ag Contact layer Other dressing (specify in notes) [name] [name], [name] P. ([id number]) 5. Secondary Dressing Applied Gauze and Kerlix/Conform Notes coban lightly to secure Electronic Signature(s) Signed: 01/14/2015 4:22:28 PM By: Montey Hora Entered By: Montey Hora on 01/14/2015 13:23:39 [name] [name] ([id number]) -------------------------------------------------------------------------------- Wound Assessment Details Patient Name: [name] [name]. Date of Service: 01/14/2015 1:00 PM Medical Record Number: [id number] Patient Account Number: [id number] Date of Birth/Sex: [birth year] (69 y.o. Male) Treating RN: Montey Hora Primary Care Physician: Lamonte Sakai Other Clinician: Referring Physician: Lamonte Sakai Treating  Physician/Extender: Frann Rider in Treatment: 4 Wound Status Wound Number: 2 Primary Trauma, Other Etiology: Wound Location: Right Lower Leg - Posterior Secondary Diabetic Wound/Ulcer of the Lower Wounding Event: Trauma Etiology: Extremity Date Acquired: 12/16/2014 Wound Open Weeks Of Treatment: 4 Status: Clustered Wound: No Notes: Dog Bite yesterday. 1 stitch sutured in place by ER MD. Comorbid Chronic Obstructive Pulmonary History: Disease (COPD), Coronary Artery Disease, Hypertension, Type II Diabetes, Osteoarthritis, Neuropathy Photos Photo Uploaded  By: Montey Hora on 01/14/2015 14:08:20 Wound Measurements Length: (cm) 3.5 Width: (cm) 2.9 Depth: (cm) 0.2 Area: (cm) 7.972 Volume: (cm) 1.594 % Reduction in Area: -915.5% % Reduction in Volume: -915.3% Epithelialization: None Tunneling: No Undermining: No Wound Description Full Thickness Without Foul Odor After Classification: Exposed Support Structures Diabetic Severity Grade 1 (Wagner): Wound Margin: Flat and Intact Exudate Amount: Small [name] [name], [name] [name] ([id number]) Cleansing: No Exudate Type: Serous Exudate Color: amber Wound Bed Granulation Amount: Medium (34-66%) Exposed Structure Granulation Quality: Red Fascia Exposed: No Necrotic Amount: Medium (34-66%) Fat Layer Exposed: No Necrotic Quality: Eschar, Adherent Slough Tendon Exposed: No Muscle Exposed: No Joint Exposed: No Bone Exposed: No Limited to Skin Breakdown Periwound Skin Texture Texture Color No Abnormalities Noted: No No Abnormalities Noted: No Callus: No Atrophie Blanche: No Crepitus: No Cyanosis: No Excoriation: No Ecchymosis: No Fluctuance: No Erythema: No Friable: No Hemosiderin Staining: No Induration: No Mottled: No Localized Edema: No Pallor: No Rash: No Rubor: No Scarring: No Temperature / Pain Moisture Temperature: No Abnormality No Abnormalities Noted: No Dry / Scaly: Yes Maceration:  No Moist: Yes Wound Preparation Ulcer Cleansing: Rinsed/Irrigated with Saline Topical Anesthetic Applied: Other: lidocaine 4%, Treatment Notes Wound #2 (Right, Posterior Lower Leg) 1. Cleansed with: Clean wound with Normal Saline 2. Anesthetic Topical Lidocaine 4% cream to wound bed prior to debridement 4. Dressing Applied: Aquacel Ag Contact layer Other dressing (specify in notes) 5. Secondary Dressing Applied YEREMY, TREBING ([id number]) Gauze and Kerlix/Conform Notes coban lightly to secure Electronic Signature(s) Signed: 01/14/2015 4:22:28 PM By: Montey Hora Entered By: Montey Hora on 01/14/2015 13:23:51 [name] [name] ([id number]) -------------------------------------------------------------------------------- Vitals Details Patient Name: [name] [name]. Date of Service: 01/14/2015 1:00 PM Medical Record Number: [id number] Patient Account Number: [id number] Date of Birth/Sex: 11/30/1945 (69 y.o. Male) Treating RN: Montey Hora Primary Care Physician: Lamonte Sakai Other Clinician: Referring Physician: Lamonte Sakai Treating Physician/Extender: Frann Rider in Treatment: 4 Vital Signs Time Taken: 13:11 Temperature (F): 98.2 Height (in): 65 Pulse (bpm): 82 Weight (lbs): 218 Respiratory Rate (breaths/min): 18 Body Mass Index (BMI): 36.3 Blood Pressure (mmHg): 115/57 Reference Range: 80 - 120 mg / dl Electronic Signature(s) Signed: 01/14/2015 4:22:28 PM By: Montey Hora Entered By: Montey Hora on 01/14/2015 13:12:31

## 2015-01-21 ENCOUNTER — Ambulatory Visit: Payer: Medicare HMO | Admitting: Surgery

## 2015-01-22 ENCOUNTER — Encounter: Payer: Medicare HMO | Admitting: Surgery

## 2015-01-22 DIAGNOSIS — L97212 Non-pressure chronic ulcer of right calf with fat layer exposed: Secondary | ICD-10-CM | POA: Diagnosis not present

## 2015-01-23 NOTE — Progress Notes (Signed)
[name] [name], SERVELLON ([id number]) Visit Report for 01/22/2015 Chief Complaint Document Details BERTRUM, NAKANO 01/22/2015 3:15 Patient Name: Date of Service: P. PM Medical Record Patient Account Number: [id number] Number: Treating RN: Montey Hora Date of Birth/Sex: 12-25-1945 (68 y.o. Male) Other Clinician: Primary Care Physician: Lamonte Sakai Treating Christin Fudge Referring Physician: Lamonte Sakai Physician/Extender: Suella Grove in Treatment: 5 Information Obtained from: Patient Chief Complaint Patient presents to the wound care center for a consult due non healing wound. He sustained a dog bite yesterday and went to the ER where first aid and suturing was done. Electronic Signature(s) Signed: 01/22/2015 4:08:19 PM By: Christin Fudge MD, FACS Entered By: Christin Fudge on 01/22/2015 16:08:18 [name] ([id number]) -------------------------------------------------------------------------------- Debridement Details [name] [name], PANICK 01/22/2015 3:15 Patient Name: Date of Service: P. PM Medical Record Patient Account Number: [id number] Number: Treating RN: Montey Hora Date of Birth/Sex: 05/19/1945 (68 y.o. Male) Other Clinician: Primary Care Physician: Lamonte Sakai Treating Christin Fudge Referring Physician: Lamonte Sakai Physician/Extender: Suella Grove in Treatment: 5 Debridement Performed for Wound #1 Right,Anterior Lower Leg Assessment: Performed By: Physician Pat Patrick., MD Debridement: Debridement Pre-procedure Yes Verification/Time Out Taken: Start Time: 16:00 Pain Control: Lidocaine 4% Topical Solution Level: Skin/Subcutaneous Tissue Total Area Debrided (L x 3 (cm) x 6 (cm) = 18 (cm) W): Tissue and other Viable, Non-Viable, Eschar, Fibrin/Slough, Subcutaneous material debrided: Bleeding: Minimum Hemostasis Achieved: Pressure End Time: 16:04 Procedural Pain: 0 Post Procedural Pain: 0 Response to Treatment: Procedure was  tolerated well Post Debridement Measurements of Total Wound Length: (cm) 3 Width: (cm) 8.5 Depth: (cm) 0.1 Volume: (cm) 2.003 Post Procedure Diagnosis Same as Pre-procedure Electronic Signature(s) Signed: 01/22/2015 4:08:12 PM By: Christin Fudge MD, FACS Signed: 01/22/2015 5:38:07 PM By: Montey Hora Previous Signature: 01/22/2015 4:07:48 PM Version By: Christin Fudge MD, FACS Entered By: Christin Fudge on 01/22/2015 16:08:12 [name] ([id number]) -------------------------------------------------------------------------------- HPI Details [name] [name], HAYMES 01/22/2015 3:15 Patient Name: Date of Service: P. PM Medical Record Patient Account Number: [id number] Number: Treating RN: Montey Hora Date of Birth/Sex: October 17, 1945 (68 y.o. Male) Other Clinician: Primary Care Physician: Lamonte Sakai Treating Christin Fudge Referring Physician: Lamonte Sakai Physician/Extender: Suella Grove in Treatment: 5 History of Present Illness Location: right lower extremity Quality: Patient reports experiencing a sharp pain to affected area(s). Severity: Patient states wound (s) are getting better. Duration: Patient states the wound has been present for ____1_days. Timing: Pain in wound is constant (hurts all the time) Context: The wound occurred when the patient was bitten by a dog yesterday morning Modifying Factors: Consults to this date include:was seen in the ED and given IV antibiotics and suturing was done. Associated Signs and Symptoms: Patient reports presence of swelling HPI Description: 69 year old gentleman was seen in the ED on 12/16/2014 with a dog bite. No notes are available on Epic. Past medical history significant for essential hypertension, COPD, sleep apnea, nephrolithiasis, hyperlipidemia, obesity, diabetes mellitus, coronary artery disease with stable angina. I understand he had IV antibiotics, a CT scan with the head was done because of a fall and this was  normal and a x-ray of the leg was done which showed no abnormality. Due to kidney disease his kidney doctor has taken him off his metformin and the patient's blood sugars have been running normal and his last hemoglobin A1c was 6 12/24/2014 -- the patient has had some redness of his lower limb but no fever or change in his diabetic control. He also has a blister come near the area where he had previous dressing  applied. Electronic Signature(s) Signed: 01/22/2015 4:08:47 PM By: Christin Fudge MD, FACS Entered By: Christin Fudge on 01/22/2015 16:08:47 [name] ([id number]) -------------------------------------------------------------------------------- Physical Exam Details FEDERICK, DACE 01/22/2015 3:15 Patient Name: Date of Service: P. PM Medical Record Patient Account Number: [id number] Number: Treating RN: Montey Hora Date of Birth/Sex: 06/29/1945 (68 y.o. Male) Other Clinician: Primary Care Physician: Lamonte Sakai Treating Christin Fudge Referring Physician: Lamonte Sakai Physician/Extender: Suella Grove in Treatment: 5 Constitutional . Pulse regular. Respirations normal and unlabored. Afebrile. . Eyes Nonicteric. Reactive to light. Ears, Nose, Mouth, and Throat Lips, teeth, and gums WNL.Marland Kitchen Moist mucosa without lesions . Neck supple and nontender. No palpable supraclavicular or cervical adenopathy. Normal sized without goiter. Respiratory WNL. No retractions.. Cardiovascular Pedal Pulses WNL. No clubbing, cyanosis or edema. Lymphatic No adneopathy. No adenopathy. No adenopathy. Musculoskeletal Adexa without tenderness or enlargement.. Digits and nails w/o clubbing, cyanosis, infection, petechiae, ischemia, or inflammatory conditions.. Integumentary (Hair, Skin) No suspicious lesions. No crepitus or fluctuance. No peri-wound warmth or erythema. No masses.Marland Kitchen Psychiatric Judgement and insight Intact.. No evidence of depression, anxiety, or  agitation.. Notes There is a lot of exudate and eschar covering the wounds but once this was removed sharply and the base of the ulcers were freshened the wound overall looks very good with healthy granulation tissue Electronic Signature(s) Signed: 01/22/2015 4:09:54 PM By: Christin Fudge MD, FACS Entered By: Christin Fudge on 01/22/2015 16:09:54 [name] ([id number]) -------------------------------------------------------------------------------- Physician Orders Details [name] [name], CANTERA 01/22/2015 3:15 Patient Name: Date of Service: P. PM Medical Record Patient Account Number: [id number] Number: Treating RN: Montey Hora Date of Birth/Sex: 1946/01/29 (68 y.o. Male) Other Clinician: Primary Care Physician: Lamonte Sakai Treating Christin Fudge Referring Physician: Lamonte Sakai Physician/Extender: Suella Grove in Treatment: 5 Verbal / Phone Orders: Yes Clinician: Montey Hora Read Back and Verified: Yes Diagnosis Coding ICD-10 Coding Code Description E11.621 Type 2 diabetes mellitus with foot ulcer W54.0XXA Bitten by dog, initial encounter [phone] Laceration without foreign body, right lower leg, initial encounter L97.212 Non-pressure chronic ulcer of right calf with fat layer exposed Wound Cleansing Wound #1 Right,Anterior Lower Leg o Cleanse wound with mild soap and water o May Shower, gently pat wound dry prior to applying new dressing. o May shower with protection. Primary Wound Dressing Wound #1 Right,Anterior Lower Leg o Aquacel Ag Secondary Dressing Wound #1 Right,Anterior Lower Leg o Contact Layer o Gauze and Kerlix/Conform - coban lightly Dressing Change Frequency Wound #1 Right,Anterior Lower Leg o Change dressing every day. Follow-up Appointments Wound #1 Right,Anterior Lower Leg o Return Appointment in 1 week. Edema Control Wound #1 Right,Anterior Lower Leg o Other: - light AZRA, HARTSEL  ([id number]) Electronic Signature(s) Signed: 01/22/2015 4:17:16 PM By: Christin Fudge MD, FACS Signed: 01/22/2015 5:38:07 PM By: Montey Hora Entered By: Montey Hora on 01/22/2015 16:15:05 [name] ([id number]) -------------------------------------------------------------------------------- Problem List Details [name] [name], RHEW 01/22/2015 3:15 Patient Name: Date of Service: P. PM Medical Record Patient Account Number: [id number] Number: Treating RN: Montey Hora Date of Birth/Sex: 02-07-1946 (68 y.o. 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 PM Medical Record Patient Account Number: [id number] Number: Treating RN: Montey Hora Date of Birth/Sex: 1946-01-21 (68 y.o. Male) Other Clinician: Primary Care Physician: Lamonte Sakai Treating Christin Fudge Referring Physician: Lamonte Sakai Physician/Extender: Suella Grove in Treatment: 5 Subjective Chief Complaint Information obtained from Patient Patient presents to the wound care center for a consult due non healing wound. He sustained a dog  bite yesterday and went to the ER where first aid and suturing was done. History of Present Illness (HPI) The following HPI elements were documented for the patient's wound: Location: right lower extremity Quality: Patient reports experiencing a sharp pain to affected area(s). Severity: Patient states wound (s) are getting better. Duration: Patient states the wound has been present for ____1_days. Timing: Pain in wound is constant (hurts all the time) Context: The wound occurred when the patient was bitten by a dog yesterday morning Modifying Factors: Consults to this date include:was seen in the ED and given IV antibiotics and suturing was done. Associated Signs and Symptoms: Patient reports presence of swelling 68 year old gentleman was seen in the ED on 12/16/2014 with a dog bite. No notes are available on Epic. Past medical history significant for essential hypertension, COPD, sleep apnea, nephrolithiasis, hyperlipidemia, obesity, diabetes mellitus, coronary artery disease with stable angina. I understand he had IV antibiotics, a CT scan with the head was done because of a fall and this was normal and a x-ray of the leg was done which showed no abnormality. Due to kidney disease his kidney doctor has taken him off his metformin and the patient's blood sugars have been running normal and his last hemoglobin A1c was 6 12/24/2014 -- the patient has had some redness of his lower limb but no fever or change in his diabetic control. He also has a blister come near the area where he had previous dressing applied. Objective Constitutional MIKE, DAFT. ([id number]) Pulse regular. Respirations normal and unlabored. Afebrile. Vitals Time Taken: 3:40 PM, Height: 65 in, Weight: 218 lbs, BMI: 36.3, Temperature: 98.3 F, Pulse: 82 bpm, Respiratory Rate: 18 breaths/min, Blood Pressure: 119/71 mmHg. Eyes Nonicteric. Reactive to light. Ears, Nose, Mouth, and Throat Lips, teeth, and gums  WNL.Marland Kitchen Moist mucosa without lesions . Neck supple and nontender. No palpable supraclavicular or cervical adenopathy. Normal sized without goiter. Respiratory WNL. No retractions.. Cardiovascular Pedal Pulses WNL. No clubbing, cyanosis or edema. Lymphatic No adneopathy. No adenopathy. No adenopathy. Musculoskeletal Adexa without tenderness or enlargement.. Digits and nails w/o clubbing, cyanosis, infection, petechiae, ischemia, or inflammatory conditions.Marland Kitchen Psychiatric Judgement and insight Intact.. No evidence of depression, anxiety, or agitation.. General Notes: There is a lot of exudate and eschar covering the wounds but once this was removed sharply and the base of the ulcers were freshened the wound overall looks very good with healthy granulation tissue Integumentary (Hair, Skin) No suspicious lesions. No crepitus or fluctuance. No peri-wound warmth or erythema. No masses.. Wound #1 status is Open. Original cause of wound was Trauma. The wound is located on the Right,Anterior Lower Leg. The wound measures 3cm length x 8.5cm width x 0.1cm depth; 20.028cm^2 area and 2.003cm^3 volume. The wound is limited to skin breakdown. There is no tunneling or undermining noted. There is a medium amount of serous drainage noted. The wound margin is flat and intact. There is large (67-100%) red, pink granulation within  the wound bed. There is a small (1-33%) amount of necrotic tissue within the wound bed including Eschar and Adherent Slough. The periwound skin appearance exhibited: Localized Edema, Moist. The periwound skin appearance did not exhibit: Callus, Crepitus, Excoriation, Fluctuance, Friable, Induration, Rash, Scarring, Dry/Scaly, Maceration, Atrophie Blanche, Cyanosis, Ecchymosis, Hemosiderin Staining, Mottled, Pallor, Rubor, Erythema. Periwound temperature was noted as No Abnormality. The periwound has tenderness on palpation. Wound #2 status is Open. Original cause of wound was Trauma.  The wound is located on the [place]. ([id number]) Right,Posterior Lower Leg. The wound measures 0cm length x 0cm width x 0cm depth; 0cm^2 area and 0cm^3 volume. Assessment Active Problems ICD-10 E11.621 - Type 2 diabetes mellitus with foot ulcer W54.0XXA - Bitten by dog, initial encounter [phone] - Laceration without foreign body, right lower leg, initial encounter [phone] - Non-pressure chronic ulcer of right calf with fat layer exposed I have recommended we continue with silver alginate and a bordered foam dressing with a light compression over this. He is urged to elevate his limb as much as possible to reduce the edema. due to a scheduling conflict he will come back and see as in 2 weeks' time. Procedures Wound #1 Wound #1 is a Trauma, Other located on the Right,Anterior Lower Leg . There was a Skin/Subcutaneous Tissue Debridement [id number]) debridement with total area of 18 sq cm performed by Pat Patrick., MD. to remove Viable and Non-Viable tissue/material including Fibrin/Slough, Eschar, and Subcutaneous after achieving pain control using Lidocaine 4% Topical Solution. A time out was conducted prior to the start of the procedure. A Minimum amount of bleeding was controlled with Pressure. The procedure was tolerated well with a pain level of 0 throughout and a pain level of 0 following the procedure. Post Debridement Measurements: 3cm length x 8.5cm width x 0.1cm depth; 2.003cm^3 volume. Post procedure Diagnosis Wound #1: Same as Pre-Procedure Plan JARRET, [name] [name]. ([id number]) I have recommended we continue with silver alginate and a bordered foam dressing with a light compression over this. He is urged to elevate his limb as much as possible to reduce the edema. due to a scheduling conflict he will come back and see as in 2 weeks' time. Electronic Signature(s) Signed: 01/22/2015 4:16:20 PM By: Christin Fudge MD, FACS Entered By: Christin Fudge on  01/22/2015 16:16:20 [name] ([id number]) -------------------------------------------------------------------------------- SuperBill Details Patient Name: [name]. Date of Service: 01/22/2015 Medical Record Number: [id number] Patient Account Number: [id number] Date of Birth/Sex: 01-09-1946 (69 y.o. Male) Treating RN: Montey Hora Primary Care Physician: Lamonte Sakai Other Clinician: Referring Physician: Lamonte Sakai Treating Physician/Extender: Frann Rider in Treatment: 5 Diagnosis Coding ICD-10 Codes Code Description E11.621 Type 2 diabetes mellitus with foot ulcer W54.0XXA Bitten by dog, initial encounter [phone] Laceration without foreign body, right lower leg, initial encounter L97.212 Non-pressure chronic ulcer of right calf with fat layer exposed Facility Procedures CPT4 Code Description: [id number] - DEB SUBQ TISSUE 20 SQ CM/< ICD-10 Description Diagnosis E11.621 Type 2 diabetes mellitus with foot ulcer S81.811A Laceration without foreign body, right lower leg, init L97.212 Non-pressure chronic ulcer of right  calf with fat laye Modifier: ial encounte r exposed Quantity: 1 r Physician Procedures CPT4 Code Description: [id number] - WC PHYS SUBQ TISS 20 SQ CM ICD-10 Description Diagnosis E11.621 Type 2 diabetes mellitus with foot ulcer S81.811A Laceration without foreign body, right lower leg, init L97.212 Non-pressure chronic ulcer of right  calf with fat laye Modifier: ial encounte r exposed Quantity: 1 r Electronic  Signature(s) Signed: 01/22/2015 4:16:38 PM By: Christin Fudge MD, FACS Entered By: Christin Fudge on 01/22/2015 16:16:38

## 2015-01-23 NOTE — Progress Notes (Signed)
[name] [name], [name] [name] ([id number]) Visit Report for 01/22/2015 Arrival Information Details Patient Name: [name] [name], [name] [name]. Date of Service: 01/22/2015 3:15 PM Medical Record Number: [id number] Patient Account Number: [id number] Date of Birth/Sex: 03-Dec-1945 (69 y.o. Male) Treating RN: Montey Hora Primary Care Physician: Lamonte Sakai Other Clinician: Referring Physician: Lamonte Sakai Treating Physician/Extender: Frann Rider in Treatment: 5 Visit Information History Since Last Visit Added or deleted any medications: No Patient Arrived: Ambulatory Any new allergies or adverse reactions: No Arrival Time: 15:40 Had a fall or experienced change in No Accompanied By: self activities of daily living that may affect Transfer Assistance: None risk of falls: Patient Identification Verified: Yes Signs or symptoms of abuse/neglect since last No Secondary Verification Process Yes visito Completed: Hospitalized since last visit: No Patient Has Alerts: Yes Pain Present Now: No Patient Alerts: DMII aspirin 81 ABI L 1.25 NO ABI R R/T wound Electronic Signature(s) Signed: 01/22/2015 5:38:07 PM By: Montey Hora Entered By: Montey Hora on 01/22/2015 15:40:28 [name] [name] ([id number]) -------------------------------------------------------------------------------- Encounter Discharge Information Details Patient Name: [name] [name]. Date of Service: 01/22/2015 3:15 PM Medical Record Number: [id number] Patient Account Number: [id number] Date of Birth/Sex: 07/21/1945 (68 y.o. Male) Treating RN: Montey Hora Primary Care Physician: Lamonte Sakai Other Clinician: Referring Physician: Lamonte Sakai Treating Physician/Extender: Frann Rider in Treatment: 5 Encounter Discharge Information Items Schedule Follow-up Appointment: No Medication Reconciliation completed No and provided to Patient/Care Provider: Provided on Clinical Summary of  Care: 01/22/2015 Form Type Recipient Paper Patient WB Electronic Signature(s) Signed: 01/22/2015 4:15:47 PM By: Ruthine Dose Entered By: Ruthine Dose on 01/22/2015 16:15:47 [name] [name] ([id number]) -------------------------------------------------------------------------------- Lower Extremity Assessment Details Patient Name: [name] [name]. Date of Service: 01/22/2015 3:15 PM Medical Record Number: [id number] Patient Account Number: [id number] Date of Birth/Sex: [birth year] (69 y.o. Male) Treating RN: Montey Hora Primary Care Physician: Lamonte Sakai Other Clinician: Referring Physician: Lamonte Sakai Treating Physician/Extender: Frann Rider in Treatment: 5 Edema Assessment Assessed: [Left: No] [Right: No] Edema: [Left: Yes] [Right: Yes] Calf Left: Right: Point of Measurement: 32 cm From Medial Instep cm 39 cm Ankle Left: Right: Point of Measurement: 10 cm From Medial Instep cm 22.9 cm Vascular Assessment Pulses: Posterior Tibial Dorsalis Pedis Palpable: [Right:Yes] Extremity colors, hair growth, and conditions: Extremity Color: [Right:Mottled] Hair Growth on Extremity: [Right:Yes] Temperature of Extremity: [Right:Warm] Capillary Refill: [Right:< 3 seconds] Electronic Signature(s) Signed: 01/22/2015 5:38:07 PM By: Montey Hora Entered By: Montey Hora on 01/22/2015 15:52:04 [name] [name] ([id number]) -------------------------------------------------------------------------------- Multi Wound Chart Details Patient Name: [name] [name]. Date of Service: 01/22/2015 3:15 PM Medical Record Number: [id number] Patient Account Number: [id number] Date of Birth/Sex: 12-Sep-1945 (69 y.o. 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] [2:the Lower Extremity] [N/A:N/A] Comorbid History: [1:Chronic Obstructive Pulmonary Disease (COPD), Coronary Artery Disease, Hypertension, Type II Diabetes, Osteoarthritis, Neuropathy] [2:N/A] [N/A:N/A] Date Acquired: [1:12/16/2014] [2:12/16/2014] [N/A:N/A] Weeks of Treatment: [1:5] [2:5] [N/A:N/A] Wound Status: [1:Open] [2:Open] [N/A:N/A] Measurements L x W x D 3x8.5x0.1 [2:0x0x0] [N/A:N/A] (cm) Area (cm) : [1:20.028] [2:0] [N/A:N/A] Volume (cm) : [1:2.003] [  2:0] [N/A:N/A] % Reduction in Area: [1:43.30%] [2:100.00%] [N/A:N/A] % Reduction in Volume: 71.70% [2:100.00%] [N/A:N/A] Classification: [1:Full Thickness Without Exposed Support Structures] [2:Full Thickness Without Exposed Support Structures] [N/A:N/A] HBO Classification: [1:Grade 1] [2:N/A] [N/A:N/A] Exudate Amount: [1:Medium] [2:N/A] [N/A:N/A] Exudate Type: [1:Serous] [2:N/A] [N/A:N/A] Exudate Color: [1:amber] [2:N/A] [N/A:N/A] Wound Margin: [1:Flat and Intact] [2:N/A] [N/A:N/A] Granulation Amount: Large (67-100%) N/A N/A Granulation Quality: Red, Pink N/A N/A Necrotic Amount: Small (1-33%) N/A N/A Necrotic Tissue: Eschar, Adherent Slough N/A N/A Exposed Structures: Fascia: No N/A N/A Fat: No Tendon: No Muscle: No Joint: No Bone: No Limited to Skin Breakdown Epithelialization: None N/A N/A Periwound Skin Texture: Edema: Yes No Abnormalities Noted N/A Excoriation: No Induration: No Callus: No Crepitus: No Fluctuance: No Friable: No Rash: No Scarring:  No Periwound Skin Moist: Yes No Abnormalities Noted N/A Moisture: Maceration: No Dry/Scaly: No Periwound Skin Color: Atrophie Blanche: No No Abnormalities Noted N/A Cyanosis: No Ecchymosis: No Erythema: No Hemosiderin Staining: No Mottled: No Pallor: No Rubor: No Temperature: No Abnormality N/A N/A Tenderness on Yes No N/A Palpation: Wound Preparation: Ulcer Cleansing: N/A N/A Rinsed/Irrigated with Saline Topical Anesthetic Applied: Other: lidocaine 4% Treatment Notes Electronic Signature(s) Signed: 01/22/2015 5:38:07 PM By: Montey Hora Entered By: Montey Hora on 01/22/2015 15:52:19 [name] [name], [name] [name] ([id number]) [name] [name], [name] [name] ([id number]) -------------------------------------------------------------------------------- Multi-Disciplinary Care Plan Details Patient Name: [name] [name], SAILE. Date of Service: 01/22/2015 3:15 PM Medical Record Number: [id number] Patient Account Number: [id number] Date of Birth/Sex: [birth year] (69 y.o. Male) Treating RN: Montey Hora Primary Care Physician: Lamonte Sakai Other Clinician: Referring Physician: Lamonte Sakai Treating Physician/Extender: Frann Rider in Treatment: 5 Active Inactive Abuse / Safety / Falls / Self Care Management Nursing Diagnoses: Potential for falls Goals: Patient will remain injury free Date Initiated: 12/17/2014 Goal Status: Active Interventions: Assess fall risk on admission and as needed Notes: Orientation to the Wound Care Program Nursing Diagnoses: Knowledge deficit related to the wound healing center program Goals: Patient/caregiver will verbalize understanding of the [facility] Program Date Initiated: 12/17/2014 Goal Status: Active Interventions: Provide education on orientation to the wound center Notes: Wound/Skin Impairment Nursing Diagnoses: Impaired tissue integrity Goals: Ulcer/skin breakdown will have a volume reduction of 30% by week 4 Date  Initiated: 12/17/2014 [name] [name], [name] [name] ([id number]) Goal Status: Active Ulcer/skin breakdown will have a volume reduction of 50% by week 8 Date Initiated: 12/17/2014 Goal Status: Active Ulcer/skin breakdown will have a volume reduction of 80% by week 12 Date Initiated: 12/17/2014 Goal Status: Active Ulcer/skin breakdown will heal within 14 weeks Date Initiated: 12/17/2014 Goal Status: Active Interventions: Assess ulceration(s) every visit Notes: Electronic Signature(s) Signed: 01/22/2015 5:38:07 PM By: Montey Hora Entered By: Montey Hora on 01/22/2015 15:52:11 [name] [name] ([id number]) -------------------------------------------------------------------------------- Patient/Caregiver Education Details Patient Name: [name] [name]. Date of Service: 01/22/2015 3:15 PM Medical Record Number: [id number] Patient Account Number: [id number] Date of Birth/Gender: 1945/09/16 (69 y.o. Male) Treating RN: Montey Hora Primary Care Physician: Lamonte Sakai Other Clinician: Referring Physician: Lamonte Sakai Treating Physician/Extender: Frann Rider in Treatment: 5 Education Assessment Education Provided To: Patient Education Topics Provided Wound/Skin Impairment: Handouts: Other: wound care as ordered Methods: Demonstration, Explain/Verbal Responses: State content correctly Electronic Signature(s) Signed: 01/22/2015 5:38:07 PM By: Montey Hora Entered By: Montey Hora on 01/22/2015 16:16:10 [name] [name] ([id number]) -------------------------------------------------------------------------------- Wound Assessment Details Patient Name: [name] [name]. Date of Service: 01/22/2015 3:15 PM Medical Record Number: [id number] Patient Account Number: [id number] Date of Birth/Sex: January 02, 1946 (69 y.o. Male) Treating RN: Montey Hora Primary Care Physician: Humphrey Rolls,  Neelam Other Clinician: Referring Physician: Lamonte Sakai Treating  Physician/Extender: Frann Rider in Treatment: 5 Wound Status Wound Number: 1 Primary Trauma, Other Etiology: Wound Location: Right Lower Leg - Anterior Secondary Diabetic Wound/Ulcer of the Lower Wounding Event: Trauma Etiology: Extremity Date Acquired: 12/16/2014 Wound Open Weeks Of Treatment: 5 Status: Clustered Wound: No Notes: Patient bitten by a dog yesterday, went to the ER and 9 stitches used to suture skin flap together. Comorbid Chronic Obstructive Pulmonary History: Disease (COPD), Coronary Artery Disease, Hypertension, Type II Diabetes, Osteoarthritis, Neuropathy Photos Photo Uploaded By: Montey Hora on 01/22/2015 16:48:32 Wound Measurements Length: (cm) 3 Width: (cm) 8.5 Depth: (cm) 0.1 Area: (cm) 20.028 Volume: (cm) 2.003 % Reduction in Area: 43.3% % Reduction in Volume: 71.7% Epithelialization: None Tunneling: No Undermining: No Wound Description Full Thickness Without Foul Odor After Classification: Exposed Support Structures Diabetic Severity Grade 1 (Wagner): Wound Margin: Flat and Intact [name] [name], [name] [name] ([id number]) Cleansing: No Exudate Amount: Medium Exudate Type: Serous Exudate Color: amber Wound Bed Granulation Amount: Large (67-100%) Exposed Structure Granulation Quality: Red, Pink Fascia Exposed: No Necrotic Amount: Small (1-33%) Fat Layer Exposed: No Necrotic Quality: Eschar, Adherent Slough Tendon Exposed: No Muscle Exposed: No Joint Exposed: No Bone Exposed: No Limited to Skin Breakdown Periwound Skin Texture Texture Color No Abnormalities Noted: No No Abnormalities Noted: No Callus: No Atrophie Blanche: No Crepitus: No Cyanosis: No Excoriation: No Ecchymosis: No Fluctuance: No Erythema: No Friable: No Hemosiderin Staining: No Induration: No Mottled: No Localized Edema: Yes Pallor: No Rash: No Rubor: No Scarring: No Temperature / Pain Moisture Temperature: No Abnormality No Abnormalities  Noted: No Tenderness on Palpation: Yes Dry / Scaly: No Maceration: No Moist: Yes Wound Preparation Ulcer Cleansing: Rinsed/Irrigated with Saline Topical Anesthetic Applied: Other: lidocaine 4%, Treatment Notes Wound #1 (Right, Anterior Lower Leg) 1. Cleansed with: Clean wound with Normal Saline 2. Anesthetic Topical Lidocaine 4% cream to wound bed prior to debridement 4. Dressing Applied: Aquacel Ag Contact layer 5. Secondary Dressing Applied [name] [name], [name] [name] ([id number]) Gauze and Kerlix/Conform Notes coban lightly to secure Electronic Signature(s) Signed: 01/22/2015 5:38:07 PM By: Montey Hora Entered By: Montey Hora on 01/22/2015 15:49:55 [name] [name] ([id number]) -------------------------------------------------------------------------------- Wound Assessment Details Patient Name: [name] [name]. Date of Service: 01/22/2015 3:15 PM Medical Record Number: [id number] Patient Account Number: [id number] Date of Birth/Sex: 04-11-1946 (69 y.o. Male) Treating RN: Montey Hora Primary Care Physician: Lamonte Sakai Other Clinician: Referring Physician: Lamonte Sakai Treating Physician/Extender: Frann Rider in Treatment: 5 Wound Status Wound Number: 2 Primary Trauma, Other Etiology: Wound Location: Right, Posterior Lower Leg Secondary Diabetic Wound/Ulcer of the Lower Wounding Event: Trauma Etiology: Extremity Date Acquired: 12/16/2014 Wound Status: Open Weeks Of Treatment: 5 Notes: Dog Bite yesterday. 1 stitch sutured Clustered Wound: No in place by ER MD. Photos Photo Uploaded By: Montey Hora on 01/22/2015 16:48:33 Wound Measurements Length: (cm) 0 % Reducti Width: (cm) 0 % Reducti Depth: (cm) 0 Area: (cm) 0 Volume: (cm) 0 on in Area: 100% on in Volume: 100% Wound Description Full Thickness Without Exposed Classification: Support Structures Periwound Skin Texture Texture Color No Abnormalities Noted: No No  Abnormalities Noted: No Moisture No Abnormalities Noted: No Electronic Signature(s) [name] [name], [name] [name] ([id number]) Signed: 01/22/2015 5:38:07 PM By: Montey Hora Entered By: Montey Hora on 01/22/2015 15:49:39 [name] [name] ([id number]) -------------------------------------------------------------------------------- Vitals Details Patient Name: [name] [name]. Date of Service: 01/22/2015 3:15 PM Medical Record Number: [id number] Patient Account Number: [id number] Date of Birth/Sex: 10-23-1945 (69 y.o. Male) Treating RN: Montey Hora  Primary Care Physician: Lamonte Sakai Other Clinician: Referring Physician: Lamonte Sakai Treating Physician/Extender: Frann Rider in Treatment: 5 Vital Signs Time Taken: 15:40 Temperature (F): 98.3 Height (in): 65 Pulse (bpm): 82 Weight (lbs): 218 Respiratory Rate (breaths/min): 18 Body Mass Index (BMI): 36.3 Blood Pressure (mmHg): 119/71 Reference Range: 80 - 120 mg / dl Electronic Signature(s) Signed: 01/22/2015 5:38:07 PM By: Montey Hora Entered By: Montey Hora on 01/22/2015 15:41:09

## 2015-01-28 ENCOUNTER — Ambulatory Visit: Payer: Medicare HMO | Admitting: General Surgery

## 2015-02-04 ENCOUNTER — Encounter: Payer: Medicare HMO | Attending: Surgery | Admitting: Surgery

## 2015-02-04 DIAGNOSIS — Z87891 Personal history of nicotine dependence: Secondary | ICD-10-CM | POA: Diagnosis not present

## 2015-02-04 DIAGNOSIS — J449 Chronic obstructive pulmonary disease, unspecified: Secondary | ICD-10-CM | POA: Diagnosis not present

## 2015-02-04 DIAGNOSIS — E669 Obesity, unspecified: Secondary | ICD-10-CM | POA: Diagnosis not present

## 2015-02-04 DIAGNOSIS — G4733 Obstructive sleep apnea (adult) (pediatric): Secondary | ICD-10-CM | POA: Insufficient documentation

## 2015-02-04 DIAGNOSIS — I2510 Atherosclerotic heart disease of native coronary artery without angina pectoris: Secondary | ICD-10-CM | POA: Diagnosis not present

## 2015-02-04 DIAGNOSIS — E785 Hyperlipidemia, unspecified: Secondary | ICD-10-CM | POA: Insufficient documentation

## 2015-02-04 DIAGNOSIS — I209 Angina pectoris, unspecified: Secondary | ICD-10-CM | POA: Diagnosis not present

## 2015-02-04 DIAGNOSIS — I120 Hypertensive chronic kidney disease with stage 5 chronic kidney disease or end stage renal disease: Secondary | ICD-10-CM | POA: Diagnosis not present

## 2015-02-04 DIAGNOSIS — L97212 Non-pressure chronic ulcer of right calf with fat layer exposed: Secondary | ICD-10-CM | POA: Insufficient documentation

## 2015-02-04 DIAGNOSIS — Z992 Dependence on renal dialysis: Secondary | ICD-10-CM | POA: Diagnosis not present

## 2015-02-04 DIAGNOSIS — E11621 Type 2 diabetes mellitus with foot ulcer: Secondary | ICD-10-CM | POA: Insufficient documentation

## 2015-02-04 DIAGNOSIS — S81811A Laceration without foreign body, right lower leg, initial encounter: Secondary | ICD-10-CM | POA: Diagnosis not present

## 2015-02-04 DIAGNOSIS — N189 Chronic kidney disease, unspecified: Secondary | ICD-10-CM | POA: Diagnosis not present

## 2015-02-05 NOTE — Progress Notes (Signed)
[name] [name], [name] [name] ([id number]) Visit Report for 02/04/2015 Arrival Information Details [name] [name], [name] [name] 02/04/2015 1:45 Patient Name: Date of Service: P. PM Medical Record Patient Account Number: [id number] Number: Treating RN: Montey Hora Date of Birth/Sex: [birth year] (68 y.o. Male) Other Clinician: Primary Care Physician: Lamonte Sakai Treating Christin Fudge Referring Physician: Lamonte Sakai Physician/Extender: Suella Grove in Treatment: 7 Visit Information History Since Last Visit Added or deleted any medications: No Patient Arrived: Ambulatory Any new allergies or adverse reactions: No Arrival Time: 13:44 Had a fall or experienced change in No Accompanied By: self activities of daily living that may affect Transfer Assistance: None risk of falls: Patient Identification Verified: Yes Signs or symptoms of abuse/neglect since last No Secondary Verification Process Yes visito Completed: Hospitalized since last visit: No Patient Has Alerts: Yes Pain Present Now: No Patient Alerts: DMII aspirin 81 ABI L 1.25 NO ABI R R/T wound Electronic Signature(s) Signed: 02/04/2015 4:23:35 PM By: Montey Hora Entered By: Montey Hora on 02/04/2015 13:45:06 [name] [name] ([id number]) -------------------------------------------------------------------------------- Encounter Discharge Information Details [name] [name], [name] [name] 02/04/2015 1:45 Patient Name: Date of Service: P. PM Medical Record Patient Account Number: [id number] Number: Treating RN: Montey Hora Date of Birth/Sex: 23-Jul-1945 (68 y.o. Male) Other Clinician: Primary Care Physician: Lamonte Sakai Treating Christin Fudge Referring Physician: Lamonte Sakai Physician/Extender: Suella Grove in Treatment: 7 Encounter Discharge Information Items Discharge Pain Level: 0 Discharge Condition: Stable Ambulatory Status: Ambulatory Discharge Destination:  Home Private Transportation: Auto Accompanied By: self Schedule Follow-up Appointment: Yes Medication Reconciliation completed and No provided to Patient/Care [name]: Clinical Summary of Care: Electronic Signature(s) Signed: 02/04/2015 4:23:35 PM By: Montey Hora Entered By: Montey Hora on 02/04/2015 14:25:02 [name] [name] ([id number]) -------------------------------------------------------------------------------- Lower Extremity Assessment Details [name] [name], [name] [name] 02/04/2015 1:45 Patient Name: Date of Service: P. PM Medical Record Patient Account Number: [id number] Number: Treating RN: Montey Hora Date of Birth/Sex: 03/08/1946 (68 y.o. Male) Other Clinician: Primary Care Physician: Lamonte Sakai Treating Christin Fudge Referring Physician: Lamonte Sakai Physician/Extender: Suella Grove in Treatment: 7 Edema Assessment Assessed: [Left: No] [Right: No] Edema: [Left: Ye] [Right: s] Calf Left: Right: Point of Measurement: 32 cm From Medial Instep cm 38.6 cm Ankle Left: Right: Point of Measurement: 10 cm From Medial Instep cm 22 cm Vascular Assessment Pulses: Posterior Tibial Dorsalis Pedis Palpable: [Right:Yes] Extremity colors, hair growth, and conditions: Extremity Color: [Right:Mottled] Hair Growth on Extremity: [Right:Yes] Temperature of Extremity: [Right:Warm] Capillary Refill: [Right:< 3 seconds] Electronic Signature(s) Signed: 02/04/2015 4:23:35 PM By: Montey Hora Entered By: Montey Hora on 02/04/2015 13:53:56 [name] [name] ([id number]) -------------------------------------------------------------------------------- Multi Wound Chart Details [name] [name], [name] [name] 02/04/2015 1:45 Patient Name: Date of Service: P. PM Medical Record Patient Account Number: [id number] Number: Treating RN: Montey Hora Date of Birth/Sex: 12/14/1945 (68 y.o. Male) Other Clinician: Primary Care Physician: Lamonte Sakai  Treating Christin Fudge Referring Physician: Lamonte Sakai Physician/Extender: Suella Grove in Treatment: 7 Vital Signs Height(in): 65 Pulse(bpm): 83 Weight(lbs): 218 Blood Pressure 112/90 (mmHg): Body Mass Index(BMI): 36 Temperature(F): 98.2 Respiratory Rate 18 (breaths/min): Photos: [1:No Photos] [N/A:N/A] Wound Location: [1:Right Lower Leg - Anterior N/A] Wounding Event: [1:Trauma] [N/A:N/A] Primary Etiology: [1:Trauma, Other] [N/A:N/A] Secondary Etiology: [1:Diabetic Wound/Ulcer of N/A the Lower Extremity] Comorbid History: [1:Chronic Obstructive Pulmonary Disease (COPD), Coronary Artery Disease, Hypertension, Type II Diabetes, Osteoarthritis, Neuropathy] [N/A:N/A] Date Acquired: [1:12/16/2014] [N/A:N/A] Weeks of Treatment: [1:7] [N/A:N/A] Wound Status: [1:Open] [N/A:N/A] Measurements L x W x D 2.8x5x0.1 [N/A:N/A] (cm) Area (cm) : [1:10.996] [N/A:N/A] Volume (cm) : [1:1.1] [N/A:N/A] % Reduction in Area: [1:68.90%] [N/A:N/A] % Reduction in Volume:  84.40% [N/A:N/A] Classification: [1:Full Thickness Without Exposed Support Structures] [N/A:N/A] HBO Classification: [1:Grade 1] [N/A:N/A] Exudate Amount: [1:Medium] [N/A:N/A] Exudate Type: [1:Serous] [N/A:N/A] Exudate Color: [1:amber] [N/A:N/A] Wound Margin: Flat and Intact N/A N/A Granulation Amount: Large (67-100%) N/A N/A Granulation Quality: Red, Pink N/A N/A Necrotic Amount: Small (1-33%) N/A N/A Necrotic Tissue: Eschar, Adherent Slough N/A N/A Exposed Structures: Fascia: No N/A N/A Fat: No Tendon: No Muscle: No Joint: No Bone: No Limited to Skin Breakdown Epithelialization: Medium (34-66%) N/A N/A Periwound Skin Texture: Edema: Yes N/A N/A Excoriation: No Induration: No Callus: No Crepitus: No Fluctuance: No Friable: No Rash: No Scarring: No Periwound Skin Moist: Yes N/A N/A Moisture: Maceration: No Dry/Scaly: No Periwound Skin Color: Atrophie Blanche: No N/A N/A Cyanosis: No Ecchymosis: No Erythema:  No Hemosiderin Staining: No Mottled: No Pallor: No Rubor: No Temperature: No Abnormality N/A N/A Tenderness on Yes N/A N/A Palpation: Wound Preparation: Ulcer Cleansing: N/A N/A Rinsed/Irrigated with Saline Topical Anesthetic Applied: Other: lidocaine 4% Treatment Notes Electronic Signature(s) Signed: 02/04/2015 4:23:35 PM By: Vickki Muff, Theophilus Bones ([id number]) Entered By: Montey Hora on 02/04/2015 13:55:29 [name] [name] ([id number]) -------------------------------------------------------------------------------- Multi-Disciplinary Care Plan Details [name] [name], [name] [name] 02/04/2015 1:45 Patient Name: Date of Service: P. PM Medical Record Patient Account Number: [id number] Number: Treating RN: Montey Hora Date of Birth/Sex: 1945-10-27 (68 y.o. Male) Other Clinician: Primary Care Physician: Lamonte Sakai Treating Christin Fudge Referring Physician: Lamonte Sakai Physician/Extender: Suella Grove in Treatment: 7 Active Inactive Abuse / Safety / Falls / Self Care Management Nursing Diagnoses: Potential for falls Goals: Patient will remain injury free Date Initiated: 12/17/2014 Goal Status: Active Interventions: Assess fall risk on admission and as needed Notes: Orientation to the Wound Care Program Nursing Diagnoses: Knowledge deficit related to the wound healing center program Goals: Patient/caregiver will verbalize understanding of the [facility] Program Date Initiated: 12/17/2014 Goal Status: Active Interventions: Provide education on orientation to the wound center Notes: Wound/Skin Impairment Nursing Diagnoses: Impaired tissue integrity Goals: XZAYDEN, LONGHURST ([id number]) Ulcer/skin breakdown will have a volume reduction of 30% by week 4 Date Initiated: 12/17/2014 Goal Status: Active Ulcer/skin breakdown will have a volume reduction of 50% by week 8 Date Initiated: 12/17/2014 Goal Status: Active Ulcer/skin  breakdown will have a volume reduction of 80% by week 12 Date Initiated: 12/17/2014 Goal Status: Active Ulcer/skin breakdown will heal within 14 weeks Date Initiated: 12/17/2014 Goal Status: Active Interventions: Assess ulceration(s) every visit Notes: Electronic Signature(s) Signed: 02/04/2015 4:23:35 PM By: Montey Hora Entered By: Montey Hora on 02/04/2015 13:55:16 [name] [name] ([id number]) -------------------------------------------------------------------------------- Patient/Caregiver Education Details [name] [name], [name] [name] 02/04/2015 1:45 Patient Name: Date of Service: P. PM Medical Record Patient Account Number: [id number] Number: Treating RN: Montey Hora Date of Birth/Gender: 1945/08/01 (68 y.o. Male) Other Clinician: Primary Care Physician: Lamonte Sakai Treating Christin Fudge Referring Physician: Lamonte Sakai Physician/Extender: Suella Grove in Treatment: 7 Education Assessment Education Provided To: Patient Education Topics Provided Wound/Skin Impairment: Handouts: Other: new wound care as ordered Methods: Demonstration, Explain/Verbal Responses: State content correctly Electronic Signature(s) Signed: 02/04/2015 4:23:35 PM By: Montey Hora Entered By: Montey Hora on 02/04/2015 14:25:18 [name] [name] ([id number]) -------------------------------------------------------------------------------- Wound Assessment Details [name] [name], [name] [name] 02/04/2015 1:45 Patient Name: Date of Service: P. PM Medical Record Patient Account Number: [id number] Number: Treating RN: Montey Hora Date of Birth/Sex: October 09, 1945 (68 y.o. Male) Other Clinician: Primary Care Physician: Lamonte Sakai Treating Christin Fudge Referring Physician: Lamonte Sakai Physician/Extender: Suella Grove in Treatment: 7 Wound Status Wound Number: 1 Primary Trauma, Other Etiology: Wound Location: Right, Anterior  Lower Leg Secondary Diabetic Wound/Ulcer of the  Lower Wounding Event: Trauma Etiology: Extremity Date Acquired: 12/16/2014 Wound Open Weeks Of Treatment: 7 Status: Clustered Wound: No Notes: Patient bitten by a dog yesterday, went to the ER and 9 stitches used to suture skin flap together. Comorbid Chronic Obstructive Pulmonary History: Disease (COPD), Coronary Artery Disease, Hypertension, Type II Diabetes, Osteoarthritis, Neuropathy Photos Photo Uploaded By: Montey Hora on 02/04/2015 14:14:43 Wound Measurements Length: (cm) 2.8 Width: (cm) 0.5 Depth: (cm) 0.1 Area: (cm) 1.1 Volume: (cm) 0.11 % Reduction in Area: 96.9% % Reduction in Volume: 98.4% Epithelialization: Medium (34-66%) Tunneling: No Undermining: No Wound Description Full Thickness Without Classification: Exposed Support Structures Grade 1 [name] [name], [name] [name] ([id number]) Foul Odor After Cleansing: No Diabetic Severity [name] [name]): Wound Margin: Flat and Intact Exudate Amount: Medium Exudate Type: Serous Exudate Color: amber Wound Bed Granulation Amount: Large (67-100%) Exposed Structure Granulation Quality: Red, Pink Fascia Exposed: No Necrotic Amount: Small (1-33%) Fat Layer Exposed: No Necrotic Quality: Eschar, Adherent Slough Tendon Exposed: No Muscle Exposed: No Joint Exposed: No Bone Exposed: No Limited to Skin Breakdown Periwound Skin Texture Texture Color No Abnormalities Noted: No No Abnormalities Noted: No Callus: No Atrophie Blanche: No Crepitus: No Cyanosis: No Excoriation: No Ecchymosis: No Fluctuance: No Erythema: No Friable: No Hemosiderin Staining: No Induration: No Mottled: No Localized Edema: Yes Pallor: No Rash: No Rubor: No Scarring: No Temperature / Pain Moisture Temperature: No Abnormality No Abnormalities Noted: No Tenderness on Palpation: Yes Dry / Scaly: No Maceration: No Moist: Yes Wound Preparation Ulcer Cleansing: Rinsed/Irrigated with Saline Topical Anesthetic Applied: Other: lidocaine  4%, Treatment Notes Wound #1 (Right, Anterior Lower Leg) 1. Cleansed with: Clean wound with Normal Saline 2. Anesthetic Topical Lidocaine 4% cream to wound bed prior to debridement 4. Dressing Applied: CASHEL, OSKEY ([id number]) Iodosorb Ointment Other dressing (specify in notes) 5. Secondary Dressing Applied Gauze and Kerlix/Conform Notes coban lightly to secure Electronic Signature(s) Signed: 02/04/2015 4:23:35 PM By: Montey Hora Entered By: Montey Hora on 02/04/2015 14:37:35 [name] [name] ([id number]) -------------------------------------------------------------------------------- Vitals Details GAVAN, LOUGHEED 02/04/2015 1:45 Patient Name: Date of Service: P. PM Medical Record Patient Account Number: [id number] Number: Treating RN: Montey Hora Date of Birth/Sex: [birth year] (68 y.o. Male) Other Clinician: Primary Care Physician: Lamonte Sakai Treating Christin Fudge Referring Physician: Lamonte Sakai Physician/Extender: Suella Grove in Treatment: 7 Vital Signs Time Taken: 13:45 Temperature (F): 98.2 Height (in): 65 Pulse (bpm): 83 Weight (lbs): 218 Respiratory Rate (breaths/min): 18 Body Mass Index (BMI): 36.3 Blood Pressure (mmHg): 112/90 Reference Range: 80 - 120 mg / dl Electronic Signature(s) Signed: 02/04/2015 4:23:35 PM By: Montey Hora Entered By: Montey Hora on 02/04/2015 13:46:04

## 2015-02-09 NOTE — Progress Notes (Signed)
SHAHRAM, [name] [name] ([id number]) Visit Report for 02/04/2015 Chief Complaint Document Details [name] [name], [name] [name] 02/04/2015 1:45 Patient Name: Date of Service: P. PM Medical Record Patient Account Number: [id number] Number: Treating RN: [name] [name] Date of Birth/Sex: [birth year] (69 y.o. Male) Other Clinician: Primary Care Treating [name] [name] Physician: Physician/Extender: Referring Physician: Orland [name] in Treatment: 7 Information Obtained from: Patient Chief Complaint Patient presents to the wound care center for a consult due non healing wound. He sustained a dog bite yesterday and went to the ER where first aid and suturing was done. Electronic Signature(s) Signed: 02/04/2015 2:08:18 PM By: [name] Fudge MD, FACS Entered By: [name] [name] on 02/04/2015 14:08:18 [name] [name] ([id number]) -------------------------------------------------------------------------------- Debridement Details [name] [name], [name] [name] 02/04/2015 1:45 Patient Name: Date of Service: P. PM Medical Record Patient Account Number: [id number] Number: Treating RN: [name] [name] Date of Birth/Sex: 08-Dec-1945 (69 y.o. Male) Other Clinician: Primary Care Treating [name] [name] Physician: Physician/Extender: Referring Physician: Orland [name] in Treatment: 7 Debridement Performed for Wound #1 Right,Anterior Lower Leg Assessment: Performed By: Physician [name] Fudge, MD Debridement: Debridement Pre-procedure Yes Verification/Time Out Taken: Start Time: 14:00 Pain Control: Lidocaine 4% Topical Solution Level: Skin/Subcutaneous Tissue Total Area Debrided (L x 2.8 (cm) x 0.5 (cm) = 1.4 (cm) W): Tissue and other Viable, Non-Viable, Eschar, Exudate, Fibrin/Slough, Skin, Subcutaneous material debrided: Instrument: Forceps Bleeding: Minimum Hemostasis Achieved: Pressure End Time: 14:03 Procedural Pain: 0 Post Procedural Pain:  0 Response to Treatment: Procedure was tolerated well Post Debridement Measurements of Total Wound Length: (cm) 2.8 Width: (cm) 0.5 Depth: (cm) 0.1 Volume: (cm) 0.11 Post Procedure Diagnosis Same as Pre-procedure Electronic Signature(s) Signed: 02/04/2015 2:08:11 PM By: [name] Fudge MD, FACS Signed: 02/08/2015 6:09:37 PM By: [name] [name] Entered By: [name] [name] on 02/04/2015 14:08:11 [name] [name], [name] [name] ([id number]) [name] [name] ([id number]) -------------------------------------------------------------------------------- HPI Details [name] [name], [name] [name] 02/04/2015 1:45 Patient Name: Date of Service: P. PM Medical Record Patient Account Number: [id number] Number: Treating RN: [name] [name] Date of Birth/Sex: 07-23-1945 (69 y.o. Male) Other Clinician: [place], [place], [place] Physician: Physician/Extender: Referring Physician: Orland [name] in Treatment: 7 History of Present Illness Location: right lower extremity Quality: Patient reports experiencing a sharp pain to affected area(s). Severity: Patient states wound (s) are getting better. Duration: Patient states the wound has been present for ____1_days. Timing: Pain in wound is constant (hurts all the time) Context: The wound occurred when the patient was bitten by a dog yesterday morning Modifying Factors: Consults to this date include:was seen in the ED and given IV antibiotics and suturing was done. Associated Signs and Symptoms: Patient reports presence of swelling HPI Description: 69 year old gentleman was seen in the ED on 12/16/2014 with a dog bite. No notes are available on Epic. Past medical history significant for essential hypertension, COPD, sleep apnea, nephrolithiasis, hyperlipidemia, obesity, diabetes mellitus, coronary artery disease with stable angina. I understand he had IV antibiotics, a CT scan with the head was done because of a fall  and this was normal and a x-ray of the leg was done which showed no abnormality. Due to kidney disease his kidney doctor has taken him off his metformin and the patient's blood sugars have been running normal and his last hemoglobin A1c was 6 12/24/2014 -- the patient has had some redness of his lower limb but no fever or change in his diabetic control. He also has a blister come near the area where he had previous dressing  applied. Electronic Signature(s) Signed: 02/04/2015 2:08:25 PM By: [name] Fudge MD, FACS Entered By: [name] [name] on 02/04/2015 14:08:25 [name] [name] ([id number]) -------------------------------------------------------------------------------- Physical Exam Details [name] [name], [name] [name] 02/04/2015 1:45 Patient Name: Date of Service: P. PM Medical Record Patient Account Number: [id number] Number: Treating RN: [name] [name] Date of Birth/Sex: 1946-03-13 (69 y.o. Male) Other Clinician: [place], [place], [place] Physician: Physician/Extender: Referring Physician: Orland [name] in Treatment: 7 Constitutional . Pulse regular. Respirations normal and unlabored. Afebrile. . Eyes Nonicteric. Reactive to light. Ears, Nose, Mouth, and Throat Lips, teeth, and gums WNL.Marland Kitchen Moist mucosa without lesions . Neck supple and nontender. No palpable supraclavicular or cervical adenopathy. Normal sized without goiter. Respiratory WNL. No retractions.. Breath sounds WNL, No rubs, rales, rhonchi, or wheeze.. Cardiovascular Heart rhythm and rate regular, no murmur or gallop.. Pedal Pulses WNL. mild edema +1 pitting. Lymphatic No adneopathy. No adenopathy. No adenopathy. Musculoskeletal Adexa without tenderness or enlargement.. Digits and nails w/o clubbing, cyanosis, infection, petechiae, ischemia, or inflammatory conditions.. Integumentary (Hair, Skin) No suspicious lesions. No crepitus or fluctuance. No peri-wound warmth or erythema.  No masses.Marland Kitchen Psychiatric Judgement and insight Intact.. No evidence of depression, anxiety, or agitation.. Notes once the exudate eschar and some of the subcutaneous debris was removed the wound looks quite clean and the edges are healing well. Electronic Signature(s) Signed: 02/04/2015 2:08:59 PM By: [name] Fudge MD, FACS Entered By: [name] [name] on 02/04/2015 14:08:59 [name] [name] ([id number]) -------------------------------------------------------------------------------- Physician Orders Details [name] [name], [name] [name] 02/04/2015 1:45 Patient Name: Date of Service: P. PM Medical Record Patient Account Number: [id number] Number: Treating RN: Montey Hora Date of Birth/Sex: 03-10-1946 (69 y.o. Male) Other Clinician: [place], [place], [place] Physician: Physician/Extender: Referring Physician: Orland [name] in Treatment: 7 Verbal / Phone Orders: Yes Clinician: Montey Hora Read Back and Verified: Yes Diagnosis Coding Wound Cleansing Wound #1 Right,Anterior Lower Leg o Cleanse wound with mild soap and water o May Shower, gently pat wound dry prior to applying new dressing. o May shower with protection. Anesthetic Wound #1 Right,Anterior Lower Leg o Topical Lidocaine 4% cream applied to wound bed prior to debridement Primary Wound Dressing Wound #1 Right,Anterior Lower Leg o Aquacel Ag Secondary Dressing Wound #1 Right,Anterior Lower Leg o Contact Layer o Gauze and Kerlix/Conform - coban lightly Dressing Change Frequency Wound #1 Right,Anterior Lower Leg o Change dressing every day. Follow-up Appointments Wound #1 Right,Anterior Lower Leg o Return Appointment in 1 week. Edema Control Wound #1 Right,Anterior Lower Leg o Other: - light SYIRE, DACKO ([id number]) Electronic Signature(s) Signed: 02/04/2015 3:40:39 PM By: [name] Fudge MD, FACS Signed: 02/04/2015 4:23:35 PM By:  Montey Hora Entered By: Montey Hora on 02/04/2015 14:00:56 [name] [name] ([id number]) -------------------------------------------------------------------------------- Problem List Details [name] [name], [name] [name] 02/04/2015 1:45 Patient Name: Date of Service: P. PM Medical Record Patient Account Number: [id number] Number: Treating RN: [name] [name] Date of Birth/Sex: 11-24-1945 (69 y.o. Male) Other Clinician: [place], [place], [place] Physician: Physician/Extender: Referring Physician: Orland [name] in Treatment: 7 Active Problems ICD-10 Encounter Code Description Active Date Diagnosis E11.621 Type 2 diabetes mellitus with foot ulcer 12/17/2014 Yes W54.0XXA Bitten by dog, initial encounter 12/17/2014 Yes S81.811A Laceration without foreign body, right lower leg, initial 12/17/2014 Yes encounter L97.212 Non-pressure chronic ulcer of right calf with fat layer 12/17/2014 Yes exposed Inactive Problems Resolved Problems Electronic Signature(s) Signed: 02/04/2015 2:08:00 PM By: [name] Fudge MD, FACS Entered By: [name] [name] on 02/04/2015 14:08:00 [name] [name] ([id number]) --------------------------------------------------------------------------------  Progress Note Details [name] [name], [name] [name] 02/04/2015 1:45 Patient Name: Date of Service: P. PM Medical Record Patient Account Number: [id number] Number: Treating RN: [name] [name] Date of Birth/Sex: Jan 25, 1946 (69 y.o. Male) Other Clinician: [place], [place], [place] Physician: Physician/Extender: Referring Physician: Orland [name] in Treatment: 7 Subjective Chief Complaint Information obtained from Patient Patient presents to the wound care center for a consult due non healing wound. He sustained a dog bite yesterday and went to the ER where first aid and suturing was done. History of Present Illness (HPI) The following HPI  elements were documented for the patient's wound: Location: right lower extremity Quality: Patient reports experiencing a sharp pain to affected area(s). Severity: Patient states wound (s) are getting better. Duration: Patient states the wound has been present for ____1_days. Timing: Pain in wound is constant (hurts all the time) Context: The wound occurred when the patient was bitten by a dog yesterday morning Modifying Factors: Consults to this date include:was seen in the ED and given IV antibiotics and suturing was done. Associated Signs and Symptoms: Patient reports presence of swelling 69 year old gentleman was seen in the ED on 12/16/2014 with a dog bite. No notes are available on Epic. Past medical history significant for essential hypertension, COPD, sleep apnea, nephrolithiasis, hyperlipidemia, obesity, diabetes mellitus, coronary artery disease with stable angina. I understand he had IV antibiotics, a CT scan with the head was done because of a fall and this was normal and a x-ray of the leg was done which showed no abnormality. Due to kidney disease his kidney doctor has taken him off his metformin and the patient's blood sugars have been running normal and his last hemoglobin A1c was 6 12/24/2014 -- the patient has had some redness of his lower limb but no fever or change in his diabetic control. He also has a blister come near the area where he had previous dressing applied. Objective [name] [name], [name] [name] ([id number]) Constitutional Pulse regular. Respirations normal and unlabored. Afebrile. Vitals Time Taken: 1:45 PM, Height: 65 in, Weight: 218 lbs, BMI: 36.3, Temperature: 98.2 F, Pulse: 83 bpm, Respiratory Rate: 18 breaths/min, Blood Pressure: 112/90 mmHg. Eyes Nonicteric. Reactive to light. Ears, Nose, Mouth, and Throat Lips, teeth, and gums WNL.Marland Kitchen Moist mucosa without lesions . Neck supple and nontender. No palpable supraclavicular or cervical adenopathy. Normal  sized without goiter. Respiratory WNL. No retractions.. Breath sounds WNL, No rubs, rales, rhonchi, or wheeze.. Cardiovascular Heart rhythm and rate regular, no murmur or gallop.. Pedal Pulses WNL. mild edema +1 pitting. Lymphatic No adneopathy. No adenopathy. No adenopathy. Musculoskeletal Adexa without tenderness or enlargement.. Digits and nails w/o clubbing, cyanosis, infection, petechiae, ischemia, or inflammatory conditions.Marland Kitchen Psychiatric Judgement and insight Intact.. No evidence of depression, anxiety, or agitation.. General Notes: once the exudate eschar and some of the subcutaneous debris was removed the wound looks quite clean and the edges are healing well. Integumentary (Hair, Skin) No suspicious lesions. No crepitus or fluctuance. No peri-wound warmth or erythema. No masses.. Wound #1 status is Open. Original cause of wound was Trauma. The wound is located on the Right,Anterior Lower Leg. The wound measures 2.8cm length x 0.5cm width x 0.1cm depth; 1.1cm^2 area and 0.11cm^3 volume. The wound is limited to skin breakdown. There is no tunneling or undermining noted. There is a medium amount of serous drainage noted. The wound margin is flat and intact. There is large (67-100%) red, pink granulation within the wound bed. There is a small (1-33%) amount of necrotic tissue  within the wound bed including Eschar and Adherent Slough. The periwound skin appearance exhibited: Localized Edema, Moist. The periwound skin appearance did not exhibit: Callus, Crepitus, Excoriation, Fluctuance, Friable, Induration, Rash, Scarring, Dry/Scaly, Maceration, Atrophie Blanche, Cyanosis, Ecchymosis, Hemosiderin Staining, Mottled, Pallor, Rubor, Erythema. Periwound temperature was noted as No Abnormality. The periwound has tenderness on palpation. [name] [name], [name] [name] ([id number]) Assessment Active Problems ICD-10 E11.621 - Type 2 diabetes mellitus with foot ulcer W54.0XXA - Bitten by dog,  initial encounter [phone] - Laceration without foreign body, right lower leg, initial encounter [phone] - Non-pressure chronic ulcer of right calf with fat layer exposed I have recommended some are his appointment over the open areas lightly covered with a gauze piece and then I have recommended compression stockings 20-30 mm to be worn all day. He will come back and see me next week. Procedures Wound #1 Wound #1 is a Trauma, Other located on the Right,Anterior Lower Leg . There was a Skin/Subcutaneous Tissue Debridement [id number]) debridement with total area of 1.4 sq cm performed by [name] Fudge, MD. with the following instrument(s): Forceps to remove Viable and Non-Viable tissue/material including Exudate, Fibrin/Slough, Eschar, Skin, and Subcutaneous after achieving pain control using Lidocaine 4% Topical Solution. A time out was conducted prior to the start of the procedure. A Minimum amount of bleeding was controlled with Pressure. The procedure was tolerated well with a pain level of 0 throughout and a pain level of 0 following the procedure. Post Debridement Measurements: 2.8cm length x 0.5cm width x 0.1cm depth; 0.11cm^3 volume. Post procedure Diagnosis Wound #1: Same as Pre-Procedure Plan Wound Cleansing: Wound #1 Right,Anterior Lower Leg: Cleanse wound with mild soap and water May Shower, gently pat wound dry prior to applying new dressing. May shower with protection. Anesthetic: [name] [name], PORTERA ([id number]) Wound #1 Right,Anterior Lower Leg: Topical Lidocaine 4% cream applied to wound bed prior to debridement Primary Wound Dressing: Wound #1 Right,Anterior Lower Leg: Aquacel Ag Secondary Dressing: Wound #1 Right,Anterior Lower Leg: Contact Layer Gauze and Kerlix/Conform - coban lightly Dressing Change Frequency: Wound #1 Right,Anterior Lower Leg: Change dressing every day. Follow-up Appointments: Wound #1 Right,Anterior Lower Leg: Return Appointment in 1  week. Edema Control: Wound #1 Right,Anterior Lower Leg: Other: - light coban I have recommended some are his appointment over the open areas lightly covered with a gauze piece and then I have recommended compression stockings 20-30 mm to be worn all day. He will come back and see me next week. Electronic Signature(s) Signed: 02/05/2015 4:09:23 PM By: [name] Fudge MD, FACS Previous Signature: 02/04/2015 2:10:03 PM Version By: [name] Fudge MD, FACS Entered By: [name] [name] on 02/05/2015 16:09:23 [name] [name] ([id number]) -------------------------------------------------------------------------------- SuperBill Details Patient Name: [name] [name]. Date of Service: 02/04/2015 Medical Record Number: [id number] Patient Account Number: [id number] Date of Birth/Sex: [birth year] (69 y.o. Male) Treating RN: [name] [name] Primary Care Physician: Lamonte Sakai Other Clinician: Referring Physician: Lamonte Sakai Treating Physician/Extender: Frann Rider in Treatment: 7 Diagnosis Coding ICD-10 Codes Code Description E11.621 Type 2 diabetes mellitus with foot ulcer W54.0XXA Bitten by dog, initial encounter [phone] Laceration without foreign body, right lower leg, initial encounter L97.212 Non-pressure chronic ulcer of right calf with fat layer exposed Facility Procedures CPT4 Code Description: [id number] - DEB SUBQ TISSUE 20 SQ CM/< ICD-10 Description Diagnosis E11.621 Type 2 diabetes mellitus with foot ulcer S81.811A Laceration without foreign body, right lower leg, init L97.212 Non-pressure chronic ulcer of right  calf with fat laye W54.0XXA Bitten by dog, initial encounter Modifier: ial  encounte r exposed Quantity: 1 r Physician Procedures CPT4 Code Description: [id number] - WC PHYS SUBQ TISS 20 SQ CM ICD-10 Description Diagnosis E11.621 Type 2 diabetes mellitus with foot ulcer S81.811A Laceration without foreign body, right lower leg, init L97.212 Non-pressure  chronic ulcer of right  calf with fat laye W54.0XXA Bitten by dog, initial encounter Modifier: ial encounte r exposed Quantity: 1 r Electronic Signature(s) Signed: 02/04/2015 2:11:26 PM By: [name] Fudge MD, FACS Entered By: [name] [name] on 02/04/2015 14:11:26

## 2015-02-11 ENCOUNTER — Encounter: Payer: Medicare HMO | Admitting: Surgery

## 2015-02-11 DIAGNOSIS — L97212 Non-pressure chronic ulcer of right calf with fat layer exposed: Secondary | ICD-10-CM | POA: Diagnosis not present

## 2015-02-12 NOTE — Progress Notes (Signed)
[name] [name], [name] [name] ([id number]) Visit Report for 02/11/2015 Arrival Information Details [name] [name], [name] [name] 02/11/2015 1:00 Patient Name: Date of Service: P. PM Medical Record Patient Account Number: [id number] Number: Treating RN: Montey Hora Date of Birth/Sex: March 17, 1946 (68 y.o. Male) Other Clinician: Primary Care Physician: Lamonte Sakai Treating Christin Fudge Referring Physician: Lamonte Sakai Physician/Extender: Suella Grove in Treatment: 8 Visit Information History Since Last Visit Added or deleted any medications: No Patient Arrived: Ambulatory Any new allergies or adverse reactions: No Arrival Time: 13:15 Had a fall or experienced change in No Accompanied By: self activities of daily living that may affect Transfer Assistance: None risk of falls: Patient Identification Verified: Yes Signs or symptoms of abuse/neglect since last No Secondary Verification Process Yes visito Completed: Hospitalized since last visit: No Patient Has Alerts: Yes Pain Present Now: No Patient Alerts: DMII aspirin 81 ABI L 1.25 NO ABI R R/T wound Electronic Signature(s) Signed: 02/11/2015 5:22:33 PM By: Montey Hora Entered By: Montey Hora on 02/11/2015 13:15:39 [name] [name] ([id number]) -------------------------------------------------------------------------------- Clinic Level of Care Assessment Details [name] [name], [name] [name] 02/11/2015 1:00 Patient Name: Date of Service: P. PM Medical Record Patient Account Number: [id number] Number: Treating RN: Montey Hora Date of Birth/Sex: 1946/02/21 (68 y.o. Male) Other Clinician: Primary Care Physician: Lamonte Sakai Treating Christin Fudge Referring Physician: Lamonte Sakai Physician/Extender: Suella Grove in Treatment: 8 Clinic Level of Care Assessment Items TOOL 4 Quantity Score []  - Use when only an EandM is performed on FOLLOW-UP visit 0 ASSESSMENTS - Nursing Assessment / Reassessment X - Reassessment  of Co-morbidities (includes updates in patient status) 1 10 X - Reassessment of Adherence to Treatment Plan 1 5 ASSESSMENTS - Wound and Skin Assessment / Reassessment X - Simple Wound Assessment / Reassessment - one wound 1 5 []  - Complex Wound Assessment / Reassessment - multiple wounds 0 []  - Dermatologic / Skin Assessment (not related to wound area) 0 ASSESSMENTS - Focused Assessment X - Circumferential Edema Measurements - multi extremities 1 5 []  - Nutritional Assessment / Counseling / Intervention 0 X - Lower Extremity Assessment (monofilament, tuning fork, pulses) 1 5 []  - Peripheral Arterial Disease Assessment (using hand held doppler) 0 ASSESSMENTS - Ostomy and/or Continence Assessment and Care []  - Incontinence Assessment and Management 0 []  - Ostomy Care Assessment and Management (repouching, etc.) 0 PROCESS - Coordination of Care X - Simple Patient / Family Education for ongoing care 1 15 []  - Complex (extensive) Patient / Family Education for ongoing care 0 []  - Staff obtains Programmer, systems, Records, Test Results / Process Orders 0 []  - Staff telephones HHA, Nursing Homes / Clarify orders / etc 0 [name] [name] ([id number]) []  - Routine Transfer to another Facility (non-emergent condition) 0 []  - Routine Hospital Admission (non-emergent condition) 0 []  - New Admissions / Biomedical engineer / Ordering NPWT, Apligraf, etc. 0 []  - Emergency Hospital Admission (emergent condition) 0 X - Simple Discharge Coordination 1 10 []  - Complex (extensive) Discharge Coordination 0 PROCESS - Special Needs []  - Pediatric / Minor Patient Management 0 []  - Isolation Patient Management 0 []  - Hearing / Language / Visual special needs 0 []  - Assessment of Community assistance (transportation, D/C planning, etc.) 0 []  - Additional assistance / Altered mentation 0 []  - Support Surface(s) Assessment (bed, cushion, seat, etc.) 0 INTERVENTIONS - Wound Cleansing / Measurement X - Simple  Wound Cleansing - one wound 1 5 []  - Complex Wound Cleansing - multiple wounds 0 X - Wound Imaging (photographs - any number of wounds) 1 5 []  -  Wound Tracing (instead of photographs) 0 X - Simple Wound Measurement - one wound 1 5 []  - Complex Wound Measurement - multiple wounds 0 INTERVENTIONS - Wound Dressings []  - Small Wound Dressing one or multiple wounds 0 X - Medium Wound Dressing one or multiple wounds 1 15 []  - Large Wound Dressing one or multiple wounds 0 []  - Application of Medications - topical 0 []  - Application of Medications - injection 0 [name] [name] ([id number]) INTERVENTIONS - Miscellaneous []  - External ear exam 0 []  - Specimen Collection (cultures, biopsies, blood, body fluids, etc.) 0 []  - Specimen(s) / Culture(s) sent or taken to Lab for analysis 0 []  - Patient Transfer (multiple staff / [name] / Similar devices) 0 []  - Simple Staple / Suture removal (25 or less) 0 []  - Complex Staple / Suture removal (26 or more) 0 []  - Hypo / Hyperglycemic Management (close monitor of Blood Glucose) 0 []  - Ankle / Brachial Index (ABI) - do not check if billed separately 0 X - Vital Signs 1 5 Has the patient been seen at the hospital within the last three years: Yes Total Score: 90 Level Of Care: New/Established - Level 3 Electronic Signature(s) Signed: 02/11/2015 5:22:33 PM By: Montey Hora Entered By: Montey Hora on 02/11/2015 13:36:19 [name] [name] ([id number]) -------------------------------------------------------------------------------- Encounter Discharge Information Details [name] [name], [name] [name] 02/11/2015 1:00 Patient Name: Date of Service: P. PM Medical Record Patient Account Number: [id number] Number: Treating RN: Montey Hora Date of Birth/Sex: 1946/03/08 (68 y.o. Male) Other Clinician: Primary Care Physician: Lamonte Sakai Treating Christin Fudge Referring Physician: Lamonte Sakai Physician/Extender: Suella Grove in Treatment:  8 Encounter Discharge Information Items Discharge Pain Level: 0 Discharge Condition: Stable Ambulatory Status: Ambulatory Discharge Destination: Home Transportation: Private Auto Accompanied By: self Schedule Follow-up Appointment: Yes Medication Reconciliation completed and provided to Patient/Care No [name] [name]: Provided on Clinical Summary of Care: 02/11/2015 Form Type Recipient Paper Patient WB Electronic Signature(s) Signed: 02/11/2015 1:39:18 PM By: Ruthine Dose Entered By: Ruthine Dose on 02/11/2015 13:39:18 [name] [name] ([id number]) -------------------------------------------------------------------------------- Lower Extremity Assessment Details [name] [name], [name] [name] 02/11/2015 1:00 Patient Name: Date of Service: P. PM Medical Record Patient Account Number: [id number] Number: Treating RN: Montey Hora Date of Birth/Sex: 04-May-1945 (68 y.o. Male) Other Clinician: Primary Care Physician: Lamonte Sakai Treating Christin Fudge Referring Physician: Lamonte Sakai Physician/Extender: Suella Grove in Treatment: 8 Edema Assessment Assessed: [Left: No] [Right: No] Edema: [Left: Ye] [Right: s] Calf Left: Right: Point of Measurement: 32 cm From Medial Instep cm 39.6 cm Ankle Left: Right: Point of Measurement: 10 cm From Medial Instep cm 22.5 cm Vascular Assessment Pulses: Posterior Tibial Dorsalis Pedis Palpable: [Right:Yes] Extremity colors, hair growth, and conditions: Extremity Color: [Right:Normal] Hair Growth on Extremity: [Right:Yes] Temperature of Extremity: [Right:Warm] Capillary Refill: [Right:< 3 seconds] Electronic Signature(s) Signed: 02/11/2015 5:22:33 PM By: Montey Hora Entered By: Montey Hora on 02/11/2015 13:23:20 [name] [name] ([id number]) -------------------------------------------------------------------------------- Multi Wound Chart Details [name] [name], [name] [name] 02/11/2015 1:00 Patient Name: Date of Service: P.  PM Medical Record Patient Account Number: [id number] Number: Treating RN: Montey Hora Date of Birth/Sex: [birth year] (68 y.o. Male) Other Clinician: Primary Care Physician: Lamonte Sakai Treating Christin Fudge Referring Physician: Lamonte Sakai Physician/Extender: Suella Grove in Treatment: 8 Vital Signs Height(in): 65 Pulse(bpm): 84 Weight(lbs): 218 Blood Pressure 134/55 (mmHg): Body Mass Index(BMI): 36 Temperature(F): 98.1 Respiratory Rate 18 (breaths/min): Photos: [1:No Photos] [N/A:N/A] Wound Location: [1:Right Lower Leg - Anterior N/A] Wounding Event: [1:Trauma] [N/A:N/A] Primary Etiology: [1:Trauma, Other] [N/A:N/A] Secondary Etiology: [1:Diabetic Wound/Ulcer of N/A the  Lower Extremity] Comorbid History: [1:Chronic Obstructive Pulmonary Disease (COPD), Coronary Artery Disease, Hypertension, Type II Diabetes, Osteoarthritis, Neuropathy] [N/A:N/A] Date Acquired: [1:12/16/2014] [N/A:N/A] Weeks of Treatment: [1:8] [N/A:N/A] Wound Status: [1:Open] [N/A:N/A] Measurements L x W x D 2.2x0.5x0.1 [N/A:N/A] (cm) Area (cm) : [1:0.864] [N/A:N/A] Volume (cm) : [1:0.086] [N/A:N/A] % Reduction in Area: [1:97.60%] [N/A:N/A] % Reduction in Volume: 98.80% [N/A:N/A] Classification: [1:Full Thickness Without Exposed Support Structures] [N/A:N/A] HBO Classification: [1:Grade 1] [N/A:N/A] Exudate Amount: [1:Medium] [N/A:N/A] Exudate Type: [1:Serous] [N/A:N/A] Exudate Color: [1:amber] [N/A:N/A] Wound Margin: Flat and Intact N/A N/A Granulation Amount: Large (67-100%) N/A N/A Granulation Quality: Red, Pink N/A N/A Necrotic Amount: None Present (0%) N/A N/A Exposed Structures: Fascia: No N/A N/A Fat: No Tendon: No Muscle: No Joint: No Bone: No Limited to Skin Breakdown Epithelialization: Medium (34-66%) N/A N/A Periwound Skin Texture: Edema: Yes N/A N/A Excoriation: No Induration: No Callus: No Crepitus: No Fluctuance: No Friable: No Rash: No Scarring:  No Periwound Skin Moist: Yes N/A N/A Moisture: Maceration: No Dry/Scaly: No Periwound Skin Color: Atrophie Blanche: No N/A N/A Cyanosis: No Ecchymosis: No Erythema: No Hemosiderin Staining: No Mottled: No Pallor: No Rubor: No Temperature: No Abnormality N/A N/A Tenderness on Yes N/A N/A Palpation: Wound Preparation: Ulcer Cleansing: N/A N/A Rinsed/Irrigated with Saline Topical Anesthetic Applied: Other: lidocaine 4% Treatment Notes Electronic Signature(s) Signed: 02/11/2015 5:22:33 PM By: Montey Hora Entered By: Montey Hora on 02/11/2015 13:26:37 [name] [name], [name] [name] ([id number]) [name] [name], [name] [name] ([id number]) -------------------------------------------------------------------------------- Multi-Disciplinary Care Plan Details [name] [name], [name] [name] 02/11/2015 1:00 Patient Name: Date of Service: P. PM Medical Record Patient Account Number: [id number] Number: Treating RN: Montey Hora Date of Birth/Sex: 1946-04-05 (68 y.o. Male) Other Clinician: Primary Care Physician: Lamonte Sakai Treating Christin Fudge Referring Physician: Lamonte Sakai Physician/Extender: Suella Grove in Treatment: 8 Active Inactive Abuse / Safety / Falls / Self Care Management Nursing Diagnoses: Potential for falls Goals: Patient will remain injury free Date Initiated: 12/17/2014 Goal Status: Active Interventions: Assess fall risk on admission and as needed Notes: Orientation to the Wound Care Program Nursing Diagnoses: Knowledge deficit related to the wound healing center program Goals: Patient/caregiver will verbalize understanding of the [facility] Program Date Initiated: 12/17/2014 Goal Status: Active Interventions: Provide education on orientation to the wound center Notes: Wound/Skin Impairment Nursing Diagnoses: Impaired tissue integrity Goals: FOX, TILLMON ([id number]) Ulcer/skin breakdown will have a volume reduction of 30% by week 4 Date  Initiated: 12/17/2014 Goal Status: Active Ulcer/skin breakdown will have a volume reduction of 50% by week 8 Date Initiated: 12/17/2014 Goal Status: Active Ulcer/skin breakdown will have a volume reduction of 80% by week 12 Date Initiated: 12/17/2014 Goal Status: Active Ulcer/skin breakdown will heal within 14 weeks Date Initiated: 12/17/2014 Goal Status: Active Interventions: Assess ulceration(s) every visit Notes: Electronic Signature(s) Signed: 02/11/2015 5:22:33 PM By: Montey Hora Entered By: Montey Hora on 02/11/2015 13:26:30 [name] [name] ([id number]) -------------------------------------------------------------------------------- Patient/Caregiver Education Details [name] [name], [name] [name] 02/11/2015 1:00 Patient Name: Date of Service: P. PM Medical Record Patient Account Number: [id number] Number: Treating RN: Montey Hora Date of Birth/Gender: April 08, 1946 (69 y.o. Male) Other Clinician: Primary Care Physician: Lamonte Sakai Treating Christin Fudge Referring Physician: Lamonte Sakai Physician/Extender: Suella Grove in Treatment: 8 Education Assessment Education Provided To: Patient Education Topics Provided Wound/Skin Impairment: Handouts: Other: wound care as ordered and keep wrap in place Methods: Demonstration, Explain/Verbal Responses: State content correctly Electronic Signature(s) Signed: 02/11/2015 5:22:33 PM By: Montey Hora Entered By: Montey Hora on 02/11/2015 13:37:30 [name] [name] ([id number]) -------------------------------------------------------------------------------- Wound Assessment Details BLUFORD, KUBES 02/11/2015  1:00 Patient Name: Date of Service: P. PM Medical Record Patient Account Number: [id number] Number: Treating RN: Montey Hora Date of Birth/Sex: 10/24/1945 (68 y.o. Male) Other Clinician: Primary Care Physician: Lamonte Sakai Treating Christin Fudge Referring Physician: Lamonte Sakai Physician/Extender: Suella Grove in Treatment: 8 Wound Status Wound Number: 1 Primary Trauma, Other Etiology: Wound Location: Right Lower Leg - Anterior Secondary Diabetic Wound/Ulcer of the Lower Wounding Event: Trauma Etiology: Extremity Date Acquired: 12/16/2014 Wound Open Weeks Of Treatment: 8 Status: Clustered Wound: No Notes: Patient bitten by a dog yesterday, went to the ER and 9 stitches used to suture skin flap together. Comorbid Chronic Obstructive Pulmonary History: Disease (COPD), Coronary Artery Disease, Hypertension, Type II Diabetes, Osteoarthritis, Neuropathy Photos Photo Uploaded By: Montey Hora on 02/11/2015 17:07:02 Wound Measurements Length: (cm) 2.2 Width: (cm) 0.5 Depth: (cm) 0.1 Area: (cm) 0.864 Volume: (cm) 0.086 % Reduction in Area: 97.6% % Reduction in Volume: 98.8% Epithelialization: Medium (34-66%) Tunneling: No Undermining: No Wound Description Full Thickness Without Classification: Exposed Support Structures Grade 1 SOTERO, TORCIVIA ([id number]) Foul Odor After Cleansing: No Diabetic Severity Earleen Newport): Wound Margin: Flat and Intact Exudate Amount: Medium Exudate Type: Serous Exudate Color: amber Wound Bed Granulation Amount: Large (67-100%) Exposed Structure Granulation Quality: Red, Pink Fascia Exposed: No Necrotic Amount: None Present (0%) Fat Layer Exposed: No Tendon Exposed: No Muscle Exposed: No Joint Exposed: No Bone Exposed: No Limited to Skin Breakdown Periwound Skin Texture Texture Color No Abnormalities Noted: No No Abnormalities Noted: No Callus: No Atrophie Blanche: No Crepitus: No Cyanosis: No Excoriation: No Ecchymosis: No Fluctuance: No Erythema: No Friable: No Hemosiderin Staining: No Induration: No Mottled: No Localized Edema: Yes Pallor: No Rash: No Rubor: No Scarring: No Temperature / Pain Moisture Temperature: No Abnormality No Abnormalities Noted: No Tenderness on Palpation:  Yes Dry / Scaly: No Maceration: No Moist: Yes Wound Preparation Ulcer Cleansing: Rinsed/Irrigated with Saline Topical Anesthetic Applied: Other: lidocaine 4%, Treatment Notes Wound #1 (Right, Anterior Lower Leg) 1. Cleansed with: Clean wound with Normal Saline 2. Anesthetic Topical Lidocaine 4% cream to wound bed prior to debridement 4. Dressing Applied: YUTA, VANGINKEL ([id number]) Mepitel 5. Secondary Dressing Applied Gauze and Kerlix/Conform Notes coban lightly to secure Electronic Signature(s) Signed: 02/11/2015 5:22:33 PM By: Montey Hora Entered By: Montey Hora on 02/11/2015 13:21:50 [name] [name] ([id number]) -------------------------------------------------------------------------------- Vitals Details LAINE, RASLER 02/11/2015 1:00 Patient Name: Date of Service: P. PM Medical Record Patient Account Number: [id number] Number: Treating RN: Montey Hora Date of Birth/Sex: 09-28-1945 (68 y.o. Male) Other Clinician: Primary Care Physician: Lamonte Sakai Treating Christin Fudge Referring Physician: Lamonte Sakai Physician/Extender: Suella Grove in Treatment: 8 Vital Signs Time Taken: 13:16 Temperature (F): 98.1 Height (in): 65 Pulse (bpm): 84 Weight (lbs): 218 Respiratory Rate (breaths/min): 18 Body Mass Index (BMI): 36.3 Blood Pressure (mmHg): 134/55 Reference Range: 80 - 120 mg / dl Electronic Signature(s) Signed: 02/11/2015 5:22:33 PM By: Montey Hora Entered By: Montey Hora on 02/11/2015 13:17:06

## 2015-02-12 NOTE — Progress Notes (Signed)
[name], [name] ([id number]) Visit Report for 02/11/2015 Chief Complaint Document Details [name] [name], [name] [name] 02/11/2015 1:00 Patient Name: Date of Service: P. PM Medical Record Patient Account Number: [id number] Number: Treating RN: Date of Birth/Sex: 09/04/1945 (69 y.o. Male) Other Clinician: Primary Care Treating Ashok Cordia Physician: Physician/Extender: Referring Physician: Orland Mustard in Treatment: 8 Information Obtained from: Patient Chief Complaint Patient presents to the wound care center for a consult due non healing wound. He sustained a dog bite yesterday and went to the ER where first aid and suturing was done. Electronic Signature(s) Signed: 02/11/2015 1:30:47 PM By: Christin Fudge MD, FACS Entered By: Christin Fudge on 02/11/2015 13:30:47 [name] [name] ([id number]) -------------------------------------------------------------------------------- HPI Details [name] [name], [name] [name] 02/11/2015 1:00 Patient Name: Date of Service: P. PM Medical Record Patient Account Number: [id number] Number: Treating RN: Date of Birth/Sex: 04/23/1946 (69 y.o. Male) Other Clinician: [place], [place], [place] Physician: Physician/Extender: Referring Physician: Orland Mustard in Treatment: 8 History of Present Illness Location: right lower extremity Quality: Patient reports experiencing a sharp pain to affected area(s). Severity: Patient states wound (s) are getting better. Duration: Patient states the wound has been present for ____1_days. Timing: Pain in wound is constant (hurts all the time) Context: The wound occurred when the patient was bitten by a dog yesterday morning Modifying Factors: Consults to this date include:was seen in the ED and given IV antibiotics and suturing was done. Associated Signs and Symptoms: Patient reports presence of swelling HPI Description: 69 year old gentleman  was seen in the ED on 12/16/2014 with a dog bite. No notes are available on Epic. Past medical history significant for essential hypertension, COPD, sleep apnea, nephrolithiasis, hyperlipidemia, obesity, diabetes mellitus, coronary artery disease with stable angina. I understand he had IV antibiotics, a CT scan with the head was done because of a fall and this was normal and a x-ray of the leg was done which showed no abnormality. Due to kidney disease his kidney doctor has taken him off his metformin and the patient's blood sugars have been running normal and his last hemoglobin A1c was 6 12/24/2014 -- the patient has had some redness of his lower limb but no fever or change in his diabetic control. He also has a blister come near the area where he had previous dressing applied. 02/11/2015 -- he has not yet gotten his compression stockings and will let is know if he doesn't get it by tomorrow. Electronic Signature(s) Signed: 02/11/2015 1:31:11 PM By: Christin Fudge MD, FACS Entered By: Christin Fudge on 02/11/2015 13:31:11 [name] [name] ([id number]) -------------------------------------------------------------------------------- Physical Exam Details [name] [name], [name] [name] 02/11/2015 1:00 Patient Name: Date of Service: P. PM Medical Record Patient Account Number: [id number] Number: Treating RN: Date of Birth/Sex: 13-Feb-1946 (69 y.o. Male) Other Clinician: [place], [place], [place] Physician: Physician/Extender: Referring Physician: Orland Mustard in Treatment: 8 Constitutional . Pulse regular. Respirations normal and unlabored. Afebrile. . Eyes Nonicteric. Reactive to light. Ears, Nose, Mouth, and Throat Lips, teeth, and gums WNL.Marland Kitchen Moist mucosa without lesions . Neck supple and nontender. No palpable supraclavicular or cervical adenopathy. Normal sized without goiter. Respiratory WNL. No retractions.. Cardiovascular Pedal Pulses  WNL. No clubbing, cyanosis or edema. Chest Breasts symmetical and no nipple discharge.. Breast tissue WNL, no masses, lumps, or tenderness.. Lymphatic No adneopathy. No adenopathy. No adenopathy. Musculoskeletal Adexa without tenderness or enlargement.. Digits and nails w/o clubbing, cyanosis, infection, petechiae, ischemia, or inflammatory conditions.. Integumentary (Hair, Skin) No suspicious lesions. No crepitus  or fluctuance. No peri-wound warmth or erythema. No masses.Marland Kitchen Psychiatric Judgement and insight Intact.. No evidence of depression, anxiety, or agitation.. Notes the wound looks very good and there is surrounding edema which is weeping a bit but there is minimal areas which are open. Electronic Signature(s) Signed: 02/11/2015 1:32:07 PM By: Christin Fudge MD, FACS Previous Signature: 02/11/2015 1:31:45 PM Version By: Christin Fudge MD, FACS Entered By: Christin Fudge on 02/11/2015 13:32:07 [name] [name], [name] [name] ([id number]) [name] [name], [name] [name] ([id number]) -------------------------------------------------------------------------------- Physician Orders Details [name] [name], [name] [name] 02/11/2015 1:00 Patient Name: Date of Service: P. PM Medical Record Patient Account Number: [id number] Number: Treating RN: Montey Hora Date of Birth/Sex: [birth year] (68 y.o. Male) Other Clinician: [place], [place], [place] Physician: Physician/Extender: Referring Physician: Orland Mustard in Treatment: 8 Verbal / Phone Orders: Yes Clinician: Montey Hora Read Back and Verified: Yes Diagnosis Coding Wound Cleansing Wound #1 Right,Anterior Lower Leg o Cleanse wound with mild soap and water o May Shower, gently pat wound dry prior to applying new dressing. o May shower with protection. Anesthetic Wound #1 Right,Anterior Lower Leg o Topical Lidocaine 4% cream applied to wound bed prior to debridement Primary Wound Dressing Wound #1  Right,Anterior Lower Leg o Mepitel One Secondary Dressing Wound #1 Right,Anterior Lower Leg o Gauze and Kerlix/Conform - coban lightly Dressing Change Frequency Wound #1 Right,Anterior Lower Leg o Change dressing every week Follow-up Appointments Wound #1 Right,Anterior Lower Leg o Return Appointment in 1 week. Edema Control Wound #1 Right,Anterior Lower Leg o Other: - light coban Electronic Signature(s) [name] [name], [name] [name] ([id number]) Signed: 02/11/2015 4:31:29 PM By: Christin Fudge MD, FACS Signed: 02/11/2015 5:22:33 PM By: Montey Hora Entered By: Montey Hora on 02/11/2015 13:28:58 [name] [name] ([id number]) -------------------------------------------------------------------------------- Problem List Details [name] [name], [name] [name] 02/11/2015 1:00 Patient Name: Date of Service: P. PM Medical Record Patient Account Number: [id number] Number: Treating RN: Date of Birth/Sex: 07-08-1945 (69 y.o. Male) Other Clinician: [place], [place], [place] Physician: Physician/Extender: Referring Physician: Orland Mustard in Treatment: 8 Active Problems ICD-10 Encounter Code Description Active Date Diagnosis E11.621 Type 2 diabetes mellitus with foot ulcer 12/17/2014 Yes W54.0XXA Bitten by dog, initial encounter 12/17/2014 Yes S81.811A Laceration without foreign body, right lower leg, initial 12/17/2014 Yes encounter L97.212 Non-pressure chronic ulcer of right calf with fat layer 12/17/2014 Yes exposed Inactive Problems Resolved Problems Electronic Signature(s) Signed: 02/11/2015 1:30:36 PM By: Christin Fudge MD, FACS Entered By: Christin Fudge on 02/11/2015 13:30:36 [name] [name] ([id number]) -------------------------------------------------------------------------------- Progress Note Details [name] [name], [name] [name] 02/11/2015 1:00 Patient Name: Date of Service: P. PM Medical Record Patient Account Number:  [id number] Number: Treating RN: Date of Birth/Sex: 1945-07-29 (69 y.o. Male) Other Clinician: [place], [place], [place] Physician: Physician/Extender: Referring Physician: Orland Mustard in Treatment: 8 Subjective Chief Complaint Information obtained from Patient Patient presents to the wound care center for a consult due non healing wound. He sustained a dog bite yesterday and went to the ER where first aid and suturing was done. History of Present Illness (HPI) The following HPI elements were documented for the patient's wound: Location: right lower extremity Quality: Patient reports experiencing a sharp pain to affected area(s). Severity: Patient states wound (s) are getting better. Duration: Patient states the wound has been present for ____1_days. Timing: Pain in wound is constant (hurts all the time) Context: The wound occurred when the patient was bitten by a dog yesterday morning Modifying Factors: Consults to this date include:was seen in the ED and  given IV antibiotics and suturing was done. Associated Signs and Symptoms: Patient reports presence of swelling 69 year old gentleman was seen in the ED on 12/16/2014 with a dog bite. No notes are available on Epic. Past medical history significant for essential hypertension, COPD, sleep apnea, nephrolithiasis, hyperlipidemia, obesity, diabetes mellitus, coronary artery disease with stable angina. I understand he had IV antibiotics, a CT scan with the head was done because of a fall and this was normal and a x-ray of the leg was done which showed no abnormality. Due to kidney disease his kidney doctor has taken him off his metformin and the patient's blood sugars have been running normal and his last hemoglobin A1c was 6 12/24/2014 -- the patient has had some redness of his lower limb but no fever or change in his diabetic control. He also has a blister come near the area where he had  previous dressing applied. 02/11/2015 -- he has not yet gotten his compression stockings and will let is know if he doesn't get it by tomorrow. [name] [name], [name] [name] ([id number]) Objective Constitutional Pulse regular. Respirations normal and unlabored. Afebrile. Vitals Time Taken: 1:16 PM, Height: 65 in, Weight: 218 lbs, BMI: 36.3, Temperature: 98.1 F, Pulse: 84 bpm, Respiratory Rate: 18 breaths/min, Blood Pressure: 134/55 mmHg. Eyes Nonicteric. Reactive to light. Ears, Nose, Mouth, and Throat Lips, teeth, and gums WNL.Marland Kitchen Moist mucosa without lesions . Neck supple and nontender. No palpable supraclavicular or cervical adenopathy. Normal sized without goiter. Respiratory WNL. No retractions.. Cardiovascular Pedal Pulses WNL. No clubbing, cyanosis or edema. Chest Breasts symmetical and no nipple discharge.. Breast tissue WNL, no masses, lumps, or tenderness.. Lymphatic No adneopathy. No adenopathy. No adenopathy. Musculoskeletal Adexa without tenderness or enlargement.. Digits and nails w/o clubbing, cyanosis, infection, petechiae, ischemia, or inflammatory conditions.Marland Kitchen Psychiatric Judgement and insight Intact.. No evidence of depression, anxiety, or agitation.. General Notes: the wound looks very good and there is surrounding edema which is weeping a bit but there is minimal areas which are open. Integumentary (Hair, Skin) No suspicious lesions. No crepitus or fluctuance. No peri-wound warmth or erythema. No masses.. Wound #1 status is Open. Original cause of wound was Trauma. The wound is located on the Right,Anterior Lower Leg. The wound measures 2.2cm length x 0.5cm width x 0.1cm depth; 0.864cm^2 area and 0.086cm^3 volume. The wound is limited to skin breakdown. There is no tunneling or undermining noted. There is a medium amount of serous drainage noted. The wound margin is flat and intact. There is large (67-100%) red, pink granulation within the wound bed. There is no  necrotic tissue within the wound [name] [name], [name] P. ([id number]) bed. The periwound skin appearance exhibited: Localized Edema, Moist. The periwound skin appearance did not exhibit: Callus, Crepitus, Excoriation, Fluctuance, Friable, Induration, Rash, Scarring, Dry/Scaly, Maceration, Atrophie Blanche, Cyanosis, Ecchymosis, Hemosiderin Staining, Mottled, Pallor, Rubor, Erythema. Periwound temperature was noted as No Abnormality. The periwound has tenderness on palpation. Assessment Active Problems ICD-10 E11.621 - Type 2 diabetes mellitus with foot ulcer W54.0XXA - Bitten by dog, initial encounter [phone] - Laceration without foreign body, right lower leg, initial encounter [phone] - Non-pressure chronic ulcer of right calf with fat layer exposed I have recommended a piece of Mepitel over the area which is closing and then to apply a 2 layer compression wrap. I have also made him understand that he has to bring his compression stockings with him the next time he comes as I anticipate he will be discharged from the wound clinic. Plan Wound Cleansing: Wound #1  Right,Anterior Lower Leg: Cleanse wound with mild soap and water May Shower, gently pat wound dry prior to applying new dressing. May shower with protection. Anesthetic: Wound #1 Right,Anterior Lower Leg: Topical Lidocaine 4% cream applied to wound bed prior to debridement Primary Wound Dressing: Wound #1 Right,Anterior Lower Leg: Mepitel One Secondary Dressing: Wound #1 Right,Anterior Lower Leg: Gauze and Kerlix/Conform - coban lightly Dressing Change Frequency: Wound #1 Right,Anterior Lower Leg: Change dressing every week [name] [name], [name] [name] ([id number]) Follow-up Appointments: Wound #1 Right,Anterior Lower Leg: Return Appointment in 1 week. Edema Control: Wound #1 Right,Anterior Lower Leg: Other: - light coban I have recommended a piece of Mepitel over the area which is closing and then to apply a 2  layer compression wrap. I have also made him understand that he has to bring his compression stockings with him the next time he comes as I anticipate he will be discharged from the wound clinic. Electronic Signature(s) Signed: 02/11/2015 1:32:52 PM By: Christin Fudge MD, FACS Entered By: Christin Fudge on 02/11/2015 13:32:52 [name] [name] ([id number]) -------------------------------------------------------------------------------- SuperBill Details Patient Name: [name] [name]. Date of Service: 02/11/2015 Medical Record Number: [id number] Patient Account Number: [id number] Date of Birth/Sex: [birth year] (68 y.o. Male) Treating RN: Primary Care Physician: Lamonte Sakai Other Clinician: Referring Physician: Lamonte Sakai Treating Physician/Extender: Frann Rider in Treatment: 8 Diagnosis Coding ICD-10 Codes Code Description E11.621 Type 2 diabetes mellitus with foot ulcer W54.0XXA Bitten by dog, initial encounter [phone] Laceration without foreign body, right lower leg, initial encounter L97.212 Non-pressure chronic ulcer of right calf with fat layer exposed Facility Procedures CPT4 Code: [id number] Description: 99213 - WOUND CARE VISIT-LEV 3 EST PT Modifier: Quantity: 1 Physician Procedures CPT4 Code Description: [id number] - WC PHYS LEVEL 3 - EST PT ICD-10 Description Diagnosis E11.621 Type 2 diabetes mellitus with foot ulcer W54.0XXA Bitten by dog, initial encounter [phone] Laceration without foreign body, right lower leg, init  L97.212 Non-pressure chronic ulcer of right calf with fat laye Modifier: ial encounte r exposed Quantity: 1 r Electronic Signature(s) Signed: 02/11/2015 4:31:29 PM By: Christin Fudge MD, FACS Signed: 02/11/2015 5:22:33 PM By: Montey Hora Previous Signature: 02/11/2015 1:33:06 PM Version By: Christin Fudge MD, FACS Entered By: Montey Hora on 02/11/2015 13:36:33

## 2015-02-18 ENCOUNTER — Encounter: Payer: Medicare HMO | Admitting: Surgery

## 2015-02-18 DIAGNOSIS — L97212 Non-pressure chronic ulcer of right calf with fat layer exposed: Secondary | ICD-10-CM | POA: Diagnosis not present

## 2015-02-19 NOTE — Progress Notes (Signed)
[name] [name], [name] [name] ([id number]) Visit Report for 02/18/2015 Chief Complaint Document Details [name] [name], [name] [name] 02/18/2015 1:00 Patient Name: Date of Service: P. PM Medical Record Patient Account Number: [id number] Number: Treating RN: Montey Hora Date of Birth/Sex: [birth year] (68 y.o. Male) Other Clinician: [place], [place], [place] Physician: Physician/Extender: Referring Physician: Orland Mustard in Treatment: 9 Information Obtained from: Patient Chief Complaint Patient presents to the wound care center for a consult due non healing wound. He sustained a dog bite yesterday and went to the ER where first aid and suturing was done. Electronic Signature(s) Signed: 02/18/2015 1:35:45 PM By: Christin Fudge MD, FACS Entered By: Christin Fudge on 02/18/2015 13:35:44 [name] [name] ([id number]) -------------------------------------------------------------------------------- HPI Details [name] [name] 02/18/2015 1:00 Patient Name: Date of Service: P. PM Medical Record Patient Account Number: [id number] Number: Treating RN: Montey Hora Date of Birth/Sex: 1945/04/29 (68 y.o. Male) Other Clinician: [place], [place], [place] Physician: Physician/Extender: Referring Physician: Orland Mustard in Treatment: 9 History of Present Illness Location: right lower extremity Quality: Patient reports experiencing a sharp pain to affected area(s). Severity: Patient states wound (s) are getting better. Duration: Patient states the wound has been present for ____1_days. Timing: Pain in wound is constant (hurts all the time) Context: The wound occurred when the patient was bitten by a dog yesterday morning Modifying Factors: Consults to this date include:was seen in the ED and given IV antibiotics and suturing was done. Associated Signs and Symptoms: Patient reports presence of swelling HPI  Description: 69 year old gentleman was seen in the ED on 12/16/2014 with a dog bite. No notes are available on Epic. Past medical history significant for essential hypertension, COPD, sleep apnea, nephrolithiasis, hyperlipidemia, obesity, diabetes mellitus, coronary artery disease with stable angina. I understand he had IV antibiotics, a CT scan with the head was done because of a fall and this was normal and a x-ray of the leg was done which showed no abnormality. Due to kidney disease his kidney doctor has taken him off his metformin and the patient's blood sugars have been running normal and his last hemoglobin A1c was 6 12/24/2014 -- the patient has had some redness of his lower limb but no fever or change in his diabetic control. He also has a blister come near the area where he had previous dressing applied. 02/11/2015 -- he has not yet gotten his compression stockings and will let is know if he doesn't get it by tomorrow. Electronic Signature(s) Signed: 02/18/2015 1:35:51 PM By: Christin Fudge MD, FACS Entered By: Christin Fudge on 02/18/2015 13:35:50 [name] [name] ([id number]) -------------------------------------------------------------------------------- Physical Exam Details [name] [name], [name] [name] 02/18/2015 1:00 Patient Name: Date of Service: P. PM Medical Record Patient Account Number: [id number] Number: Treating RN: Montey Hora Date of Birth/Sex: June 18, 1945 (68 y.o. Male) Other Clinician: [place], [place], [place] Physician: Physician/Extender: Referring Physician: Orland Mustard in Treatment: 9 Constitutional . Pulse regular. Respirations normal and unlabored. Afebrile. . Eyes Nonicteric. Reactive to light. Ears, Nose, Mouth, and Throat Lips, teeth, and gums WNL.Marland Kitchen Moist mucosa without lesions . Neck supple and nontender. No palpable supraclavicular or cervical adenopathy. Normal sized without goiter. Respiratory WNL.  No retractions.. Cardiovascular Pedal Pulses WNL. No clubbing, cyanosis or edema. Lymphatic No adneopathy. No adenopathy. No adenopathy. Musculoskeletal Adexa without tenderness or enlargement.. Digits and nails w/o clubbing, cyanosis, infection, petechiae, ischemia, or inflammatory conditions.. Integumentary (Hair, Skin) No suspicious lesions. No crepitus or fluctuance. No peri-wound warmth or erythema. No masses.Marland Kitchen  Psychiatric Judgement and insight Intact.. No evidence of depression, anxiety, or agitation.. Notes the wound is completely healed and his edema has gone down significantly and there is no weeping from the skin. Electronic Signature(s) Signed: 02/18/2015 1:36:27 PM By: Christin Fudge MD, FACS Entered By: Christin Fudge on 02/18/2015 13:36:27 [name] [name] ([id number]) -------------------------------------------------------------------------------- Physician Orders Details [name] [name], [name] [name] 02/18/2015 1:00 Patient Name: Date of Service: P. PM Medical Record Patient Account Number: [id number] Number: Treating RN: Montey Hora Date of Birth/Sex: May 07, 1945 (68 y.o. Male) Other Clinician: Primary Care Treating Ashok Cordia Physician: Physician/Extender: Referring Physician: Orland Mustard in Treatment: 9 Verbal / Phone Orders: Yes Clinician: Montey Hora Read Back and Verified: Yes Diagnosis Coding Discharge From [facility] o Discharge from [place] Signature(s) Signed: 02/18/2015 3:59:19 PM By: Christin Fudge MD, FACS Signed: 02/18/2015 5:54:13 PM By: Montey Hora Entered By: Montey Hora on 02/18/2015 13:25:07 [name] [name] ([id number]) -------------------------------------------------------------------------------- Problem List Details [name] [name], [name] [name] 02/18/2015 1:00 Patient Name: Date of Service: P. PM Medical Record Patient Account Number:  [id number] Number: Treating RN: Montey Hora Date of Birth/Sex: 04-Dec-1945 (68 y.o. Male) Other Clinician: [place], [place], [place] Physician: Physician/Extender: Referring Physician: Orland Mustard in Treatment: 9 Active Problems ICD-10 Encounter Code Description Active Date Diagnosis E11.621 Type 2 diabetes mellitus with foot ulcer 12/17/2014 Yes W54.0XXA Bitten by dog, initial encounter 12/17/2014 Yes S81.811A Laceration without foreign body, right lower leg, initial 12/17/2014 Yes encounter L97.212 Non-pressure chronic ulcer of right calf with fat layer 12/17/2014 Yes exposed Inactive Problems Resolved Problems Electronic Signature(s) Signed: 02/18/2015 1:35:38 PM By: Christin Fudge MD, FACS Entered By: Christin Fudge on 02/18/2015 13:35:38 [name] [name] ([id number]) -------------------------------------------------------------------------------- Progress Note Details [name] [name], [name] [name] 02/18/2015 1:00 Patient Name: Date of Service: P. PM Medical Record Patient Account Number: [id number] Number: Treating RN: Montey Hora Date of Birth/Sex: 24-Jul-1945 (68 y.o. Male) Other Clinician: [place], [place], [place] Physician: Physician/Extender: Referring Physician: Orland Mustard in Treatment: 9 Subjective Chief Complaint Information obtained from Patient Patient presents to the wound care center for a consult due non healing wound. He sustained a dog bite yesterday and went to the ER where first aid and suturing was done. History of Present Illness (HPI) The following HPI elements were documented for the patient's wound: Location: right lower extremity Quality: Patient reports experiencing a sharp pain to affected area(s). Severity: Patient states wound (s) are getting better. Duration: Patient states the wound has been present for ____1_days. Timing: Pain in wound is constant  (hurts all the time) Context: The wound occurred when the patient was bitten by a dog yesterday morning Modifying Factors: Consults to this date include:was seen in the ED and given IV antibiotics and suturing was done. Associated Signs and Symptoms: Patient reports presence of swelling 69 year old gentleman was seen in the ED on 12/16/2014 with a dog bite. No notes are available on Epic. Past medical history significant for essential hypertension, COPD, sleep apnea, nephrolithiasis, hyperlipidemia, obesity, diabetes mellitus, coronary artery disease with stable angina. I understand he had IV antibiotics, a CT scan with the head was done because of a fall and this was normal and a x-ray of the leg was done which showed no abnormality. Due to kidney disease his kidney doctor has taken him off his metformin and the patient's blood sugars have been running normal and his last hemoglobin A1c was 6 12/24/2014 -- the patient has had some redness of his lower  limb but no fever or change in his diabetic control. He also has a blister come near the area where he had previous dressing applied. 02/11/2015 -- he has not yet gotten his compression stockings and will let is know if he doesn't get it by tomorrow. [name] [name], [name] [name] ([id number]) Objective Constitutional Pulse regular. Respirations normal and unlabored. Afebrile. Vitals Time Taken: 1:14 PM, Height: 65 in, Weight: 218 lbs, BMI: 36.3, Temperature: 98.3 F, Pulse: 85 bpm, Respiratory Rate: 18 breaths/min, Blood Pressure: 113/62 mmHg. Eyes Nonicteric. Reactive to light. Ears, Nose, Mouth, and Throat Lips, teeth, and gums WNL.Marland Kitchen Moist mucosa without lesions . Neck supple and nontender. No palpable supraclavicular or cervical adenopathy. Normal sized without goiter. Respiratory WNL. No retractions.. Cardiovascular Pedal Pulses WNL. No clubbing, cyanosis or edema. Lymphatic No adneopathy. No adenopathy. No  adenopathy. Musculoskeletal Adexa without tenderness or enlargement.. Digits and nails w/o clubbing, cyanosis, infection, petechiae, ischemia, or inflammatory conditions.Marland Kitchen Psychiatric Judgement and insight Intact.. No evidence of depression, anxiety, or agitation.. General Notes: the wound is completely healed and his edema has gone down significantly and there is no weeping from the skin. Integumentary (Hair, Skin) No suspicious lesions. No crepitus or fluctuance. No peri-wound warmth or erythema. No masses.. Wound #1 status is Open. Original cause of wound was Trauma. The wound is located on the Right,Anterior Lower Leg. The wound measures 0cm length x 0cm width x 0cm depth; 0cm^2 area and 0cm^3 volume. [name] [name], [name] [name] ([id number]) Assessment Active Problems ICD-10 E11.621 - Type 2 diabetes mellitus with foot ulcer W54.0XXA - Bitten by dog, initial encounter [phone] - Laceration without foreign body, right lower leg, initial encounter [phone] - Non-pressure chronic ulcer of right calf with fat layer exposed His wound has healed well and there is no open area. The erythema is minimal with his compression wrap. He has his compression stockings today and we will send him home with these. He is been instructed how to her disease and the length of time and the fact that he is to take them out at night. He is discharged from the wound care services and will be seen back as needed. Plan Discharge From [facility]: Discharge from [name] His wound has healed well and there is no open area. The erythema is minimal with his compression wrap. He has his compression stockings today and we will send him home with these. He is been instructed how to her disease and the length of time and the fact that he is to take them out at night. He is discharged from the wound care services and will be seen back as needed. Electronic Signature(s) Signed: 02/18/2015 1:37:35 PM By: Christin Fudge MD, FACS Entered By: Christin Fudge on 02/18/2015 13:37:35 [name] [name] ([id number]) -------------------------------------------------------------------------------- SuperBill Details Patient Name: [name] [name]. Date of Service: 02/18/2015 Medical Record Number: [id number] Patient Account Number: [id number] Date of Birth/Sex: April 13, 1946 (69 y.o. Male) Treating RN: Montey Hora Primary Care Physician: Lamonte Sakai Other Clinician: Referring Physician: Lamonte Sakai Treating Physician/Extender: Frann Rider in Treatment: 9 Diagnosis Coding ICD-10 Codes Code Description E11.621 Type 2 diabetes mellitus with foot ulcer W54.0XXA Bitten by dog, initial encounter [phone] Laceration without foreign body, right lower leg, initial encounter L97.212 Non-pressure chronic ulcer of right calf with fat layer exposed Facility Procedures CPT4 Code: [id number] Description: [phone] - WOUND CARE VISIT-LEV 2 EST PT Modifier: Quantity: 1 Physician Procedures CPT4 Code Description: [id number] - WC PHYS LEVEL 3 - EST PT ICD-10 Description Diagnosis E11.621  Type 2 diabetes mellitus with foot ulcer W54.0XXA Bitten by dog, initial encounter [phone] Laceration without foreign body, right lower leg, init  L97.212 Non-pressure chronic ulcer of right calf with fat laye Modifier: ial encounte r exposed Quantity: 1 r Electronic Signature(s) Signed: 02/18/2015 1:37:52 PM By: Christin Fudge MD, FACS Entered By: Christin Fudge on 02/18/2015 13:37:52

## 2015-02-19 NOTE — Progress Notes (Signed)
GEOF, PIMENTA ([id number]) Visit Report for 02/18/2015 Arrival Information Details [name] [name], [name] [name] 02/18/2015 1:00 Patient Name: Date of Service: P. PM Medical Record Patient Account Number: [id number] Number: Treating RN: Montey Hora Date of Birth/Sex: 1945/09/18 (68 y.o. Male) Other Clinician: [place], [place], [place] Physician: Physician/Extender: Referring Physician: Orland Mustard in Treatment: 9 Visit Information History Since Last Visit Added or deleted any medications: No Patient Arrived: Ambulatory Any new allergies or adverse reactions: No Arrival Time: 13:12 Had a fall or experienced change in No Accompanied By: self activities of daily living that may affect Transfer Assistance: None risk of falls: Patient Identification Verified: Yes Signs or symptoms of abuse/neglect since last No Secondary Verification Process Yes visito Completed: Hospitalized since last visit: No Patient Has Alerts: Yes Pain Present Now: No Patient Alerts: DMII aspirin 81 ABI L 1.25 NO ABI R R/T wound Electronic Signature(s) Signed: 02/18/2015 5:54:13 PM By: Montey Hora Entered By: Montey Hora on 02/18/2015 13:13:46 [name] [name] ([id number]) -------------------------------------------------------------------------------- Clinic Level of Care Assessment Details [name] [name], [name] [name] 02/18/2015 1:00 Patient Name: Date of Service: P. PM Medical Record Patient Account Number: [id number] Number: Treating RN: Montey Hora Date of Birth/Sex: 17-Dec-1945 (68 y.o. Male) Other Clinician: Primary Care Treating Ashok Cordia Physician: Physician/Extender: Referring Physician: Orland Mustard in Treatment: 9 Clinic Level of Care Assessment Items TOOL 4 Quantity Score []  - Use when only an EandM is performed on FOLLOW-UP visit 0 ASSESSMENTS - Nursing Assessment / Reassessment X -  Reassessment of Co-morbidities (includes updates in patient status) 1 10 X - Reassessment of Adherence to Treatment Plan 1 5 ASSESSMENTS - Wound and Skin Assessment / Reassessment X - Simple Wound Assessment / Reassessment - one wound 1 5 []  - Complex Wound Assessment / Reassessment - multiple wounds 0 []  - Dermatologic / Skin Assessment (not related to wound area) 0 ASSESSMENTS - Focused Assessment X - Circumferential Edema Measurements - multi extremities 1 5 []  - Nutritional Assessment / Counseling / Intervention 0 X - Lower Extremity Assessment (monofilament, tuning fork, pulses) 1 5 []  - Peripheral Arterial Disease Assessment (using hand held doppler) 0 ASSESSMENTS - Ostomy and/or Continence Assessment and Care []  - Incontinence Assessment and Management 0 []  - Ostomy Care Assessment and Management (repouching, etc.) 0 PROCESS - Coordination of Care X - Simple Patient / Family Education for ongoing care 1 15 []  - Complex (extensive) Patient / Family Education for ongoing care 0 []  - Staff obtains Consents, Records, Test Results / Process Orders 0 [name] [name], [name] [name] ([id number]) []  - Staff telephones HHA, Nursing Homes / Clarify orders / etc 0 []  - Routine Transfer to another Facility (non-emergent condition) 0 []  - Routine Hospital Admission (non-emergent condition) 0 []  - New Admissions / Biomedical engineer / Ordering NPWT, Apligraf, etc. 0 []  - Emergency Hospital Admission (emergent condition) 0 X - Simple Discharge Coordination 1 10 []  - Complex (extensive) Discharge Coordination 0 PROCESS - Special Needs []  - Pediatric / Minor Patient Management 0 []  - Isolation Patient Management 0 []  - Hearing / Language / Visual special needs 0 []  - Assessment of Community assistance (transportation, D/C planning, etc.) 0 []  - Additional assistance / Altered mentation 0 []  - Support Surface(s) Assessment (bed, cushion, seat, etc.) 0 INTERVENTIONS - Wound Cleansing /  Measurement X - Simple Wound Cleansing - one wound 1 5 []  - Complex Wound Cleansing - multiple wounds 0 X - Wound Imaging (photographs - any number of wounds) 1 5 []  -  Wound Tracing (instead of photographs) 0 X - Simple Wound Measurement - one wound 1 5 []  - Complex Wound Measurement - multiple wounds 0 INTERVENTIONS - Wound Dressings []  - Small Wound Dressing one or multiple wounds 0 []  - Medium Wound Dressing one or multiple wounds 0 []  - Large Wound Dressing one or multiple wounds 0 []  - Application of Medications - topical 0 []  - Application of Medications - injection 0 [name] [name], [name] [name] ([id number]) INTERVENTIONS - Miscellaneous []  - External ear exam 0 []  - Specimen Collection (cultures, biopsies, blood, body fluids, etc.) 0 []  - Specimen(s) / Culture(s) sent or taken to Lab for analysis 0 []  - Patient Transfer (multiple staff / [name] / Similar devices) 0 []  - Simple Staple / Suture removal (25 or less) 0 []  - Complex Staple / Suture removal (26 or more) 0 []  - Hypo / Hyperglycemic Management (close monitor of Blood Glucose) 0 []  - Ankle / Brachial Index (ABI) - do not check if billed separately 0 X - Vital Signs 1 5 Has the patient been seen at the hospital within the last three years: Yes Total Score: 75 Level Of Care: New/Established - Level 2 Electronic Signature(s) Signed: 02/18/2015 5:54:13 PM By: Montey Hora Entered By: Montey Hora on 02/18/2015 13:25:31 [name] [name] ([id number]) -------------------------------------------------------------------------------- Encounter Discharge Information Details [name] [name], [name] [name] 02/18/2015 1:00 Patient Name: Date of Service: P. PM Medical Record Patient Account Number: [id number] Number: Treating RN: Montey Hora Date of Birth/Sex: 1946/02/15 (68 y.o. Male) Other Clinician: Primary Care Treating Ashok Cordia Physician: Physician/Extender: Referring Physician: Orland Mustard in Treatment: 9 Encounter Discharge Information Items Discharge Pain Level: 0 Discharge Condition: Stable Ambulatory Status: Ambulatory Discharge Destination: Home Transportation: Private Auto Accompanied By: self Schedule Follow-up Appointment: No Medication Reconciliation completed and provided to Patient/Care No Alaila Pillard: Provided on Clinical Summary of Care: 02/18/2015 Form Type Recipient Paper Patient WB Electronic Signature(s) Signed: 02/18/2015 1:34:51 PM By: Ruthine Dose Entered By: Ruthine Dose on 02/18/2015 13:34:51 [name] [name] ([id number]) -------------------------------------------------------------------------------- Lower Extremity Assessment Details [name] [name], [name] [name] 02/18/2015 1:00 Patient Name: Date of Service: P. PM Medical Record Patient Account Number: [id number] Number: Treating RN: Montey Hora Date of Birth/Sex: 05-12-1945 (68 y.o. Male) Other Clinician: Primary Care Treating Ashok Cordia Physician: Physician/Extender: Referring Physician: Orland Mustard in Treatment: 9 Edema Assessment Assessed: [Left: No] [Right: No] Edema: [Left: Ye] [Right: s] Calf Left: Right: Point of Measurement: 32 cm From Medial Instep cm 39.1 cm Ankle Left: Right: Point of Measurement: 10 cm From Medial Instep cm 22.2 cm Vascular Assessment Pulses: Posterior Tibial Dorsalis Pedis Palpable: [Right:Yes] Extremity colors, hair growth, and conditions: Extremity Color: [Right:Mottled] Hair Growth on Extremity: [Right:Yes] Temperature of Extremity: [Right:Warm] Capillary Refill: [Right:< 3 seconds] Toe Nail Assessment Left: Right: Thick: No Discolored: No Deformed: No Improper Length and Hygiene: No Electronic Signature(s) Signed: 02/18/2015 5:54:13 PM By: Vickki Muff, Theophilus Bones ([id number]) Entered By: Montey Hora on 02/18/2015 13:19:30 [name] [name]  ([id number]) -------------------------------------------------------------------------------- Multi-Disciplinary Care Plan Details [name] [name], [name] [name] 02/18/2015 1:00 Patient Name: Date of Service: P. PM Medical Record Patient Account Number: [id number] Number: Treating RN: Montey Hora Date of Birth/Sex: 1946-04-22 (68 y.o. Male) Other Clinician: [place], [place], [place] Physician: Physician/Extender: Referring Physician: Orland Mustard in Treatment: 9 Active Inactive Electronic Signature(s) Signed: 02/18/2015 5:54:13 PM By: Montey Hora Entered By: Montey Hora on 02/18/2015 13:24:47 [name] [name] ([id number]) -------------------------------------------------------------------------------- Patient/Caregiver Education Details [name] [name], [name] [name] 02/18/2015 1:00 Patient  Name: Date of Service: P. PM Medical Record Patient Account Number: [id number] Number: Treating RN: Montey Hora Date of Birth/Gender: February 19, 1946 (69 y.o. Male) Other Clinician: Primary Care Treating Ashok Cordia Physician: Physician/Extender: Referring Physician: Orland Mustard in Treatment: 9 Education Assessment Education Provided To: Patient Education Topics Provided Venous: Handouts: Other: continue wearing compression hose daily Methods: Demonstration, Explain/Verbal Responses: State content correctly Electronic Signature(s) Signed: 02/18/2015 5:54:13 PM By: Montey Hora Entered By: Montey Hora on 02/18/2015 13:26:17 [name] [name] ([id number]) -------------------------------------------------------------------------------- Wound Assessment Details [name] [name], [name] [name] 02/18/2015 1:00 Patient Name: Date of Service: P. PM Medical Record Patient Account Number: [id number] Number: Treating RN: Montey Hora Date of Birth/Sex: November 08, 1945 (68 y.o. Male) Other Clinician: Primary Care  Treating Ashok Cordia Physician: Physician/Extender: Referring Physician: Orland Mustard in Treatment: 9 Wound Status Wound Number: 1 Primary Trauma, Other Etiology: Wound Location: Right, Anterior Lower Leg Secondary Diabetic Wound/Ulcer of the Lower Wounding Event: Trauma Etiology: Extremity Date Acquired: 12/16/2014 Wound Open Weeks Of Treatment: 9 Status: Clustered Wound: No Notes: Patient bitten by a dog yesterday, went to the ER and 9 stitches used to suture skin flap together. Photos Photo Uploaded By: Montey Hora on 02/18/2015 15:53:19 Wound Measurements Length: (cm) 0 % Reduct Width: (cm) 0 % Reduct Depth: (cm) 0 Area: (cm) 0 Volume: (cm) 0 ion in Area: 100% ion in Volume: 100% Wound Description Full Thickness Without Exposed Classification: Support Structures Periwound Skin Texture Texture Color No Abnormalities Noted: No No Abnormalities Noted: No [name] [name], [name] [name] ([id number]) Moisture No Abnormalities Noted: No Electronic Signature(s) Signed: 02/18/2015 5:54:13 PM By: Montey Hora Entered By: Montey Hora on 02/18/2015 13:19:35 [name] [name] ([id number]) -------------------------------------------------------------------------------- Vitals Details [name] [name], [name] [name] 02/18/2015 1:00 Patient Name: Date of Service: P. PM Medical Record Patient Account Number: [id number] Number: Treating RN: Montey Hora Date of Birth/Sex: Sep 07, 1945 (68 y.o. Male) Other Clinician: [place], [place], [place] Physician: Physician/Extender: Referring Physician: Orland Mustard in Treatment: 9 Vital Signs Time Taken: 13:14 Temperature (F): 98.3 Height (in): 65 Pulse (bpm): 85 Weight (lbs): 218 Respiratory Rate (breaths/min): 18 Body Mass Index (BMI): 36.3 Blood Pressure (mmHg): 113/62 Reference Range: 80 - 120 mg / dl Electronic Signature(s) Signed: 02/18/2015 5:54:13 PM By:  Montey Hora Entered By: Montey Hora on 02/18/2015 13:14:07

## 2015-04-25 DIAGNOSIS — J449 Chronic obstructive pulmonary disease, unspecified: Secondary | ICD-10-CM | POA: Diagnosis not present

## 2015-04-30 DIAGNOSIS — J449 Chronic obstructive pulmonary disease, unspecified: Secondary | ICD-10-CM | POA: Diagnosis not present

## 2015-05-26 DIAGNOSIS — J449 Chronic obstructive pulmonary disease, unspecified: Secondary | ICD-10-CM | POA: Diagnosis not present

## 2015-05-31 DIAGNOSIS — E782 Mixed hyperlipidemia: Secondary | ICD-10-CM | POA: Diagnosis not present

## 2015-05-31 DIAGNOSIS — J449 Chronic obstructive pulmonary disease, unspecified: Secondary | ICD-10-CM | POA: Diagnosis not present

## 2015-05-31 DIAGNOSIS — I10 Essential (primary) hypertension: Secondary | ICD-10-CM | POA: Diagnosis not present

## 2015-05-31 DIAGNOSIS — E663 Overweight: Secondary | ICD-10-CM | POA: Diagnosis not present

## 2015-05-31 DIAGNOSIS — E119 Type 2 diabetes mellitus without complications: Secondary | ICD-10-CM | POA: Diagnosis not present

## 2015-05-31 DIAGNOSIS — I2510 Atherosclerotic heart disease of native coronary artery without angina pectoris: Secondary | ICD-10-CM | POA: Diagnosis not present

## 2015-06-23 DIAGNOSIS — J449 Chronic obstructive pulmonary disease, unspecified: Secondary | ICD-10-CM | POA: Diagnosis not present

## 2015-06-28 DIAGNOSIS — J449 Chronic obstructive pulmonary disease, unspecified: Secondary | ICD-10-CM | POA: Diagnosis not present

## 2015-06-29 DIAGNOSIS — E119 Type 2 diabetes mellitus without complications: Secondary | ICD-10-CM | POA: Diagnosis not present

## 2015-06-29 DIAGNOSIS — E782 Mixed hyperlipidemia: Secondary | ICD-10-CM | POA: Diagnosis not present

## 2015-06-29 DIAGNOSIS — G4730 Sleep apnea, unspecified: Secondary | ICD-10-CM | POA: Diagnosis not present

## 2015-06-29 DIAGNOSIS — M2550 Pain in unspecified joint: Secondary | ICD-10-CM | POA: Diagnosis not present

## 2015-06-29 DIAGNOSIS — I10 Essential (primary) hypertension: Secondary | ICD-10-CM | POA: Diagnosis not present

## 2015-06-30 DIAGNOSIS — E119 Type 2 diabetes mellitus without complications: Secondary | ICD-10-CM | POA: Diagnosis not present

## 2015-06-30 DIAGNOSIS — I10 Essential (primary) hypertension: Secondary | ICD-10-CM | POA: Diagnosis not present

## 2015-06-30 DIAGNOSIS — M2510 Fistula, unspecified joint: Secondary | ICD-10-CM | POA: Diagnosis not present

## 2015-06-30 DIAGNOSIS — E782 Mixed hyperlipidemia: Secondary | ICD-10-CM | POA: Diagnosis not present

## 2015-07-07 DIAGNOSIS — G4733 Obstructive sleep apnea (adult) (pediatric): Secondary | ICD-10-CM | POA: Diagnosis not present

## 2015-07-12 DIAGNOSIS — M25511 Pain in right shoulder: Secondary | ICD-10-CM | POA: Diagnosis not present

## 2015-07-12 DIAGNOSIS — M2550 Pain in unspecified joint: Secondary | ICD-10-CM | POA: Diagnosis not present

## 2015-07-12 DIAGNOSIS — J449 Chronic obstructive pulmonary disease, unspecified: Secondary | ICD-10-CM | POA: Diagnosis not present

## 2015-07-12 DIAGNOSIS — G8929 Other chronic pain: Secondary | ICD-10-CM | POA: Diagnosis not present

## 2015-07-12 DIAGNOSIS — R7689 Other specified abnormal immunological findings in serum: Secondary | ICD-10-CM | POA: Insufficient documentation

## 2015-07-12 DIAGNOSIS — N200 Calculus of kidney: Secondary | ICD-10-CM | POA: Diagnosis not present

## 2015-07-12 DIAGNOSIS — N184 Chronic kidney disease, stage 4 (severe): Secondary | ICD-10-CM | POA: Diagnosis not present

## 2015-07-12 DIAGNOSIS — M179 Osteoarthritis of knee, unspecified: Secondary | ICD-10-CM | POA: Diagnosis not present

## 2015-07-12 DIAGNOSIS — G4733 Obstructive sleep apnea (adult) (pediatric): Secondary | ICD-10-CM | POA: Diagnosis not present

## 2015-07-12 DIAGNOSIS — E1142 Type 2 diabetes mellitus with diabetic polyneuropathy: Secondary | ICD-10-CM | POA: Diagnosis not present

## 2015-07-12 DIAGNOSIS — R768 Other specified abnormal immunological findings in serum: Secondary | ICD-10-CM | POA: Diagnosis not present

## 2015-07-12 HISTORY — DX: Other specified abnormal immunological findings in serum: R76.8

## 2015-07-24 DIAGNOSIS — J449 Chronic obstructive pulmonary disease, unspecified: Secondary | ICD-10-CM | POA: Diagnosis not present

## 2015-07-28 DIAGNOSIS — R700 Elevated erythrocyte sedimentation rate: Secondary | ICD-10-CM | POA: Diagnosis not present

## 2015-07-28 DIAGNOSIS — R7982 Elevated C-reactive protein (CRP): Secondary | ICD-10-CM | POA: Diagnosis not present

## 2015-07-28 DIAGNOSIS — N184 Chronic kidney disease, stage 4 (severe): Secondary | ICD-10-CM | POA: Diagnosis not present

## 2015-07-29 DIAGNOSIS — J449 Chronic obstructive pulmonary disease, unspecified: Secondary | ICD-10-CM | POA: Diagnosis not present

## 2015-08-09 DIAGNOSIS — G4730 Sleep apnea, unspecified: Secondary | ICD-10-CM | POA: Diagnosis not present

## 2015-08-09 DIAGNOSIS — E782 Mixed hyperlipidemia: Secondary | ICD-10-CM | POA: Diagnosis not present

## 2015-08-09 DIAGNOSIS — E119 Type 2 diabetes mellitus without complications: Secondary | ICD-10-CM | POA: Diagnosis not present

## 2015-08-09 DIAGNOSIS — I10 Essential (primary) hypertension: Secondary | ICD-10-CM | POA: Diagnosis not present

## 2015-08-16 DIAGNOSIS — R809 Proteinuria, unspecified: Secondary | ICD-10-CM | POA: Diagnosis not present

## 2015-08-16 DIAGNOSIS — I129 Hypertensive chronic kidney disease with stage 1 through stage 4 chronic kidney disease, or unspecified chronic kidney disease: Secondary | ICD-10-CM | POA: Diagnosis not present

## 2015-08-16 DIAGNOSIS — E1122 Type 2 diabetes mellitus with diabetic chronic kidney disease: Secondary | ICD-10-CM | POA: Diagnosis not present

## 2015-08-16 DIAGNOSIS — N183 Chronic kidney disease, stage 3 (moderate): Secondary | ICD-10-CM | POA: Diagnosis not present

## 2015-08-17 DIAGNOSIS — G4733 Obstructive sleep apnea (adult) (pediatric): Secondary | ICD-10-CM | POA: Diagnosis not present

## 2015-08-17 DIAGNOSIS — J9611 Chronic respiratory failure with hypoxia: Secondary | ICD-10-CM | POA: Diagnosis not present

## 2015-08-17 DIAGNOSIS — J449 Chronic obstructive pulmonary disease, unspecified: Secondary | ICD-10-CM | POA: Diagnosis not present

## 2015-08-23 DIAGNOSIS — J449 Chronic obstructive pulmonary disease, unspecified: Secondary | ICD-10-CM | POA: Diagnosis not present

## 2015-08-28 DIAGNOSIS — J449 Chronic obstructive pulmonary disease, unspecified: Secondary | ICD-10-CM | POA: Diagnosis not present

## 2015-09-22 DIAGNOSIS — M79641 Pain in right hand: Secondary | ICD-10-CM | POA: Diagnosis not present

## 2015-09-22 DIAGNOSIS — M659 Synovitis and tenosynovitis, unspecified: Secondary | ICD-10-CM | POA: Diagnosis not present

## 2015-09-23 DIAGNOSIS — J449 Chronic obstructive pulmonary disease, unspecified: Secondary | ICD-10-CM | POA: Diagnosis not present

## 2015-09-27 DIAGNOSIS — I10 Essential (primary) hypertension: Secondary | ICD-10-CM | POA: Diagnosis not present

## 2015-09-27 DIAGNOSIS — G4733 Obstructive sleep apnea (adult) (pediatric): Secondary | ICD-10-CM | POA: Diagnosis not present

## 2015-09-27 DIAGNOSIS — E119 Type 2 diabetes mellitus without complications: Secondary | ICD-10-CM | POA: Diagnosis not present

## 2015-09-27 DIAGNOSIS — E782 Mixed hyperlipidemia: Secondary | ICD-10-CM | POA: Diagnosis not present

## 2015-09-28 DIAGNOSIS — J449 Chronic obstructive pulmonary disease, unspecified: Secondary | ICD-10-CM | POA: Diagnosis not present

## 2015-10-01 DIAGNOSIS — I10 Essential (primary) hypertension: Secondary | ICD-10-CM | POA: Diagnosis not present

## 2015-10-01 DIAGNOSIS — E782 Mixed hyperlipidemia: Secondary | ICD-10-CM | POA: Diagnosis not present

## 2015-10-01 DIAGNOSIS — E119 Type 2 diabetes mellitus without complications: Secondary | ICD-10-CM | POA: Diagnosis not present

## 2015-10-11 DIAGNOSIS — M7551 Bursitis of right shoulder: Secondary | ICD-10-CM | POA: Diagnosis not present

## 2015-10-11 DIAGNOSIS — L851 Acquired keratosis [keratoderma] palmaris et plantaris: Secondary | ICD-10-CM | POA: Diagnosis not present

## 2015-10-11 DIAGNOSIS — G4733 Obstructive sleep apnea (adult) (pediatric): Secondary | ICD-10-CM | POA: Diagnosis not present

## 2015-10-11 DIAGNOSIS — E782 Mixed hyperlipidemia: Secondary | ICD-10-CM | POA: Diagnosis not present

## 2015-10-11 DIAGNOSIS — E119 Type 2 diabetes mellitus without complications: Secondary | ICD-10-CM | POA: Diagnosis not present

## 2015-10-11 DIAGNOSIS — B351 Tinea unguium: Secondary | ICD-10-CM | POA: Diagnosis not present

## 2015-10-11 DIAGNOSIS — E1142 Type 2 diabetes mellitus with diabetic polyneuropathy: Secondary | ICD-10-CM | POA: Diagnosis not present

## 2015-10-11 DIAGNOSIS — I10 Essential (primary) hypertension: Secondary | ICD-10-CM | POA: Diagnosis not present

## 2015-10-12 ENCOUNTER — Other Ambulatory Visit: Payer: Self-pay | Admitting: Internal Medicine

## 2015-10-12 DIAGNOSIS — M25511 Pain in right shoulder: Secondary | ICD-10-CM

## 2015-10-22 ENCOUNTER — Ambulatory Visit
Admission: RE | Admit: 2015-10-22 | Discharge: 2015-10-22 | Disposition: A | Discharge status: Home / Self Care | Payer: PPO | Source: Ambulatory Visit | Attending: Internal Medicine | Admitting: Internal Medicine

## 2015-10-22 DIAGNOSIS — M75121 Complete rotator cuff tear or rupture of right shoulder, not specified as traumatic: Secondary | ICD-10-CM | POA: Diagnosis not present

## 2015-10-22 DIAGNOSIS — M19011 Primary osteoarthritis, right shoulder: Secondary | ICD-10-CM | POA: Diagnosis not present

## 2015-10-22 DIAGNOSIS — M25511 Pain in right shoulder: Secondary | ICD-10-CM | POA: Diagnosis not present

## 2015-10-22 DIAGNOSIS — M75101 Unspecified rotator cuff tear or rupture of right shoulder, not specified as traumatic: Secondary | ICD-10-CM | POA: Diagnosis not present

## 2015-10-23 DIAGNOSIS — J449 Chronic obstructive pulmonary disease, unspecified: Secondary | ICD-10-CM | POA: Diagnosis not present

## 2015-10-25 DIAGNOSIS — E782 Mixed hyperlipidemia: Secondary | ICD-10-CM | POA: Diagnosis not present

## 2015-10-25 DIAGNOSIS — I10 Essential (primary) hypertension: Secondary | ICD-10-CM | POA: Diagnosis not present

## 2015-10-25 DIAGNOSIS — G4733 Obstructive sleep apnea (adult) (pediatric): Secondary | ICD-10-CM | POA: Diagnosis not present

## 2015-10-25 DIAGNOSIS — R0602 Shortness of breath: Secondary | ICD-10-CM | POA: Diagnosis not present

## 2015-10-25 DIAGNOSIS — I2510 Atherosclerotic heart disease of native coronary artery without angina pectoris: Secondary | ICD-10-CM | POA: Diagnosis not present

## 2015-10-25 DIAGNOSIS — I237 Postinfarction angina: Secondary | ICD-10-CM | POA: Diagnosis not present

## 2015-10-25 DIAGNOSIS — E119 Type 2 diabetes mellitus without complications: Secondary | ICD-10-CM | POA: Diagnosis not present

## 2015-10-28 DIAGNOSIS — J449 Chronic obstructive pulmonary disease, unspecified: Secondary | ICD-10-CM | POA: Diagnosis not present

## 2015-11-15 DIAGNOSIS — M7581 Other shoulder lesions, right shoulder: Secondary | ICD-10-CM | POA: Insufficient documentation

## 2015-11-15 DIAGNOSIS — M75121 Complete rotator cuff tear or rupture of right shoulder, not specified as traumatic: Secondary | ICD-10-CM | POA: Diagnosis not present

## 2015-11-15 DIAGNOSIS — M12811 Other specific arthropathies, not elsewhere classified, right shoulder: Secondary | ICD-10-CM | POA: Diagnosis not present

## 2015-11-15 HISTORY — DX: Other shoulder lesions, right shoulder: M75.81

## 2015-11-15 HISTORY — DX: Complete rotator cuff tear or rupture of right shoulder, not specified as traumatic: M75.121

## 2015-11-22 DIAGNOSIS — E782 Mixed hyperlipidemia: Secondary | ICD-10-CM | POA: Diagnosis not present

## 2015-11-22 DIAGNOSIS — I10 Essential (primary) hypertension: Secondary | ICD-10-CM | POA: Diagnosis not present

## 2015-11-22 DIAGNOSIS — G4733 Obstructive sleep apnea (adult) (pediatric): Secondary | ICD-10-CM | POA: Diagnosis not present

## 2015-11-22 DIAGNOSIS — E119 Type 2 diabetes mellitus without complications: Secondary | ICD-10-CM | POA: Diagnosis not present

## 2015-11-23 DIAGNOSIS — J449 Chronic obstructive pulmonary disease, unspecified: Secondary | ICD-10-CM | POA: Diagnosis not present

## 2015-11-24 DIAGNOSIS — G4733 Obstructive sleep apnea (adult) (pediatric): Secondary | ICD-10-CM | POA: Diagnosis not present

## 2015-11-28 DIAGNOSIS — J449 Chronic obstructive pulmonary disease, unspecified: Secondary | ICD-10-CM | POA: Diagnosis not present

## 2015-11-30 DIAGNOSIS — R079 Chest pain, unspecified: Secondary | ICD-10-CM | POA: Diagnosis not present

## 2015-12-06 DIAGNOSIS — I10 Essential (primary) hypertension: Secondary | ICD-10-CM | POA: Diagnosis not present

## 2015-12-06 DIAGNOSIS — I2510 Atherosclerotic heart disease of native coronary artery without angina pectoris: Secondary | ICD-10-CM | POA: Diagnosis not present

## 2015-12-06 DIAGNOSIS — G4733 Obstructive sleep apnea (adult) (pediatric): Secondary | ICD-10-CM | POA: Diagnosis not present

## 2015-12-06 DIAGNOSIS — E782 Mixed hyperlipidemia: Secondary | ICD-10-CM | POA: Diagnosis not present

## 2015-12-20 DIAGNOSIS — G4733 Obstructive sleep apnea (adult) (pediatric): Secondary | ICD-10-CM | POA: Diagnosis not present

## 2015-12-22 DIAGNOSIS — E119 Type 2 diabetes mellitus without complications: Secondary | ICD-10-CM | POA: Diagnosis not present

## 2015-12-22 DIAGNOSIS — E782 Mixed hyperlipidemia: Secondary | ICD-10-CM | POA: Diagnosis not present

## 2015-12-22 DIAGNOSIS — E663 Overweight: Secondary | ICD-10-CM | POA: Diagnosis not present

## 2015-12-22 DIAGNOSIS — J029 Acute pharyngitis, unspecified: Secondary | ICD-10-CM | POA: Diagnosis not present

## 2015-12-24 DIAGNOSIS — J449 Chronic obstructive pulmonary disease, unspecified: Secondary | ICD-10-CM | POA: Diagnosis not present

## 2015-12-29 DIAGNOSIS — J449 Chronic obstructive pulmonary disease, unspecified: Secondary | ICD-10-CM | POA: Diagnosis not present

## 2016-01-04 DIAGNOSIS — E782 Mixed hyperlipidemia: Secondary | ICD-10-CM | POA: Diagnosis not present

## 2016-01-04 DIAGNOSIS — E119 Type 2 diabetes mellitus without complications: Secondary | ICD-10-CM | POA: Diagnosis not present

## 2016-01-04 DIAGNOSIS — I10 Essential (primary) hypertension: Secondary | ICD-10-CM | POA: Diagnosis not present

## 2016-01-11 DIAGNOSIS — E119 Type 2 diabetes mellitus without complications: Secondary | ICD-10-CM | POA: Diagnosis not present

## 2016-01-11 DIAGNOSIS — I10 Essential (primary) hypertension: Secondary | ICD-10-CM | POA: Diagnosis not present

## 2016-01-11 DIAGNOSIS — G4733 Obstructive sleep apnea (adult) (pediatric): Secondary | ICD-10-CM | POA: Diagnosis not present

## 2016-01-11 DIAGNOSIS — E782 Mixed hyperlipidemia: Secondary | ICD-10-CM | POA: Diagnosis not present

## 2016-01-23 DIAGNOSIS — J449 Chronic obstructive pulmonary disease, unspecified: Secondary | ICD-10-CM | POA: Diagnosis not present

## 2016-01-28 DIAGNOSIS — J449 Chronic obstructive pulmonary disease, unspecified: Secondary | ICD-10-CM | POA: Diagnosis not present

## 2016-02-15 ENCOUNTER — Ambulatory Visit
Admission: RE | Admit: 2016-02-15 | Discharge: 2016-02-15 | Disposition: A | Discharge status: Home / Self Care | Payer: PPO | Source: Ambulatory Visit | Attending: Internal Medicine | Admitting: Internal Medicine

## 2016-02-15 ENCOUNTER — Other Ambulatory Visit: Payer: Self-pay | Admitting: Internal Medicine

## 2016-02-15 DIAGNOSIS — R0602 Shortness of breath: Secondary | ICD-10-CM | POA: Insufficient documentation

## 2016-02-15 DIAGNOSIS — J9611 Chronic respiratory failure with hypoxia: Secondary | ICD-10-CM | POA: Diagnosis not present

## 2016-02-15 DIAGNOSIS — I700 Atherosclerosis of aorta: Secondary | ICD-10-CM | POA: Insufficient documentation

## 2016-02-15 DIAGNOSIS — G4733 Obstructive sleep apnea (adult) (pediatric): Secondary | ICD-10-CM | POA: Diagnosis not present

## 2016-02-15 DIAGNOSIS — J449 Chronic obstructive pulmonary disease, unspecified: Secondary | ICD-10-CM | POA: Diagnosis not present

## 2016-02-16 DIAGNOSIS — R0602 Shortness of breath: Secondary | ICD-10-CM | POA: Diagnosis not present

## 2016-02-21 DIAGNOSIS — R809 Proteinuria, unspecified: Secondary | ICD-10-CM | POA: Diagnosis not present

## 2016-02-21 DIAGNOSIS — E1122 Type 2 diabetes mellitus with diabetic chronic kidney disease: Secondary | ICD-10-CM | POA: Diagnosis not present

## 2016-02-21 DIAGNOSIS — I129 Hypertensive chronic kidney disease with stage 1 through stage 4 chronic kidney disease, or unspecified chronic kidney disease: Secondary | ICD-10-CM | POA: Diagnosis not present

## 2016-02-21 DIAGNOSIS — N183 Chronic kidney disease, stage 3 (moderate): Secondary | ICD-10-CM | POA: Diagnosis not present

## 2016-02-23 DIAGNOSIS — J449 Chronic obstructive pulmonary disease, unspecified: Secondary | ICD-10-CM | POA: Diagnosis not present

## 2016-02-28 DIAGNOSIS — J449 Chronic obstructive pulmonary disease, unspecified: Secondary | ICD-10-CM | POA: Diagnosis not present

## 2016-03-01 DIAGNOSIS — G4733 Obstructive sleep apnea (adult) (pediatric): Secondary | ICD-10-CM | POA: Diagnosis not present

## 2016-03-06 DIAGNOSIS — G4733 Obstructive sleep apnea (adult) (pediatric): Secondary | ICD-10-CM | POA: Diagnosis not present

## 2016-03-06 DIAGNOSIS — N400 Enlarged prostate without lower urinary tract symptoms: Secondary | ICD-10-CM | POA: Diagnosis not present

## 2016-03-06 DIAGNOSIS — I10 Essential (primary) hypertension: Secondary | ICD-10-CM | POA: Diagnosis not present

## 2016-03-06 DIAGNOSIS — F411 Generalized anxiety disorder: Secondary | ICD-10-CM | POA: Diagnosis not present

## 2016-03-06 DIAGNOSIS — E119 Type 2 diabetes mellitus without complications: Secondary | ICD-10-CM | POA: Diagnosis not present

## 2016-03-06 DIAGNOSIS — E782 Mixed hyperlipidemia: Secondary | ICD-10-CM | POA: Diagnosis not present

## 2016-03-21 DIAGNOSIS — E782 Mixed hyperlipidemia: Secondary | ICD-10-CM | POA: Diagnosis not present

## 2016-03-21 DIAGNOSIS — I2510 Atherosclerotic heart disease of native coronary artery without angina pectoris: Secondary | ICD-10-CM | POA: Diagnosis not present

## 2016-03-21 DIAGNOSIS — G4733 Obstructive sleep apnea (adult) (pediatric): Secondary | ICD-10-CM | POA: Diagnosis not present

## 2016-03-21 DIAGNOSIS — R0602 Shortness of breath: Secondary | ICD-10-CM | POA: Diagnosis not present

## 2016-03-21 DIAGNOSIS — I10 Essential (primary) hypertension: Secondary | ICD-10-CM | POA: Diagnosis not present

## 2016-03-21 DIAGNOSIS — J449 Chronic obstructive pulmonary disease, unspecified: Secondary | ICD-10-CM | POA: Diagnosis not present

## 2016-03-21 DIAGNOSIS — J9611 Chronic respiratory failure with hypoxia: Secondary | ICD-10-CM | POA: Diagnosis not present

## 2016-03-23 DIAGNOSIS — G4733 Obstructive sleep apnea (adult) (pediatric): Secondary | ICD-10-CM | POA: Diagnosis not present

## 2016-03-23 DIAGNOSIS — J9611 Chronic respiratory failure with hypoxia: Secondary | ICD-10-CM | POA: Diagnosis not present

## 2016-03-23 DIAGNOSIS — J301 Allergic rhinitis due to pollen: Secondary | ICD-10-CM | POA: Diagnosis not present

## 2016-03-23 DIAGNOSIS — J449 Chronic obstructive pulmonary disease, unspecified: Secondary | ICD-10-CM | POA: Diagnosis not present

## 2016-03-24 DIAGNOSIS — J449 Chronic obstructive pulmonary disease, unspecified: Secondary | ICD-10-CM | POA: Diagnosis not present

## 2016-03-29 DIAGNOSIS — J449 Chronic obstructive pulmonary disease, unspecified: Secondary | ICD-10-CM | POA: Diagnosis not present

## 2016-04-11 DIAGNOSIS — I10 Essential (primary) hypertension: Secondary | ICD-10-CM | POA: Diagnosis not present

## 2016-04-11 DIAGNOSIS — E1142 Type 2 diabetes mellitus with diabetic polyneuropathy: Secondary | ICD-10-CM | POA: Diagnosis not present

## 2016-04-11 DIAGNOSIS — E782 Mixed hyperlipidemia: Secondary | ICD-10-CM | POA: Diagnosis not present

## 2016-04-11 DIAGNOSIS — F411 Generalized anxiety disorder: Secondary | ICD-10-CM | POA: Diagnosis not present

## 2016-04-11 DIAGNOSIS — B351 Tinea unguium: Secondary | ICD-10-CM | POA: Diagnosis not present

## 2016-04-11 DIAGNOSIS — G4733 Obstructive sleep apnea (adult) (pediatric): Secondary | ICD-10-CM | POA: Diagnosis not present

## 2016-04-11 DIAGNOSIS — L851 Acquired keratosis [keratoderma] palmaris et plantaris: Secondary | ICD-10-CM | POA: Diagnosis not present

## 2016-04-11 DIAGNOSIS — R5383 Other fatigue: Secondary | ICD-10-CM | POA: Diagnosis not present

## 2016-04-11 DIAGNOSIS — E119 Type 2 diabetes mellitus without complications: Secondary | ICD-10-CM | POA: Diagnosis not present

## 2016-04-20 DIAGNOSIS — R05 Cough: Secondary | ICD-10-CM | POA: Diagnosis not present

## 2016-04-20 DIAGNOSIS — J209 Acute bronchitis, unspecified: Secondary | ICD-10-CM | POA: Diagnosis not present

## 2016-04-20 DIAGNOSIS — G4733 Obstructive sleep apnea (adult) (pediatric): Secondary | ICD-10-CM | POA: Diagnosis not present

## 2016-04-20 DIAGNOSIS — E782 Mixed hyperlipidemia: Secondary | ICD-10-CM | POA: Diagnosis not present

## 2016-04-20 DIAGNOSIS — E119 Type 2 diabetes mellitus without complications: Secondary | ICD-10-CM | POA: Diagnosis not present

## 2016-04-20 DIAGNOSIS — I10 Essential (primary) hypertension: Secondary | ICD-10-CM | POA: Diagnosis not present

## 2016-04-20 DIAGNOSIS — J449 Chronic obstructive pulmonary disease, unspecified: Secondary | ICD-10-CM | POA: Diagnosis not present

## 2016-04-24 DIAGNOSIS — J449 Chronic obstructive pulmonary disease, unspecified: Secondary | ICD-10-CM | POA: Diagnosis not present

## 2016-04-29 DIAGNOSIS — J449 Chronic obstructive pulmonary disease, unspecified: Secondary | ICD-10-CM | POA: Diagnosis not present

## 2016-05-15 DIAGNOSIS — E782 Mixed hyperlipidemia: Secondary | ICD-10-CM | POA: Diagnosis not present

## 2016-05-15 DIAGNOSIS — G4733 Obstructive sleep apnea (adult) (pediatric): Secondary | ICD-10-CM | POA: Diagnosis not present

## 2016-05-15 DIAGNOSIS — I10 Essential (primary) hypertension: Secondary | ICD-10-CM | POA: Diagnosis not present

## 2016-05-15 DIAGNOSIS — E119 Type 2 diabetes mellitus without complications: Secondary | ICD-10-CM | POA: Diagnosis not present

## 2016-05-16 ENCOUNTER — Ambulatory Visit: Payer: PPO | Admitting: Urology

## 2016-05-16 ENCOUNTER — Encounter: Payer: Self-pay | Admitting: Urology

## 2016-05-16 VITALS — BP 120/76 | HR 86 | Ht 67.0 in | Wt 237.9 lb

## 2016-05-16 DIAGNOSIS — N401 Enlarged prostate with lower urinary tract symptoms: Secondary | ICD-10-CM | POA: Diagnosis not present

## 2016-05-16 DIAGNOSIS — R351 Nocturia: Secondary | ICD-10-CM

## 2016-05-16 DIAGNOSIS — E291 Testicular hypofunction: Secondary | ICD-10-CM

## 2016-05-16 DIAGNOSIS — R7989 Other specified abnormal findings of blood chemistry: Secondary | ICD-10-CM

## 2016-05-16 DIAGNOSIS — N138 Other obstructive and reflux uropathy: Secondary | ICD-10-CM | POA: Diagnosis not present

## 2016-05-16 MED ORDER — TAMSULOSIN HCL 0.4 MG PO CAPS: 0.4000 mg | ORAL_CAPSULE | Freq: Every day | ORAL | 11 refills | Status: DC

## 2016-05-16 NOTE — Progress Notes (Signed)
05/16/2016 2:55 PM   [place] 1946-04-23 [id number]  Referring provider: Perrin Maltese, MD [address] [place], [place] [address]  Chief Complaint  Patient presents with  . New Patient (Initial Visit)    low testosterone     HPI: 71 yo referred for low T. Referred by Dr.Khan.  He complains of fatigue, no energy or initiative. He has weight gain. He has OSA and sleeps with a CPAP. He wears O2 and has COPD. A 04/14/2016 T was 204 and PSA 0.7. He has a normal libido. He has two kids.   He has occasional frequency, urgency, nocturia. He voids with a good flow. He saw Dr. Eliberto Ivory in the past for kidney stones. Now, he follows with nephrology.  He drinks a lot of water. He voids with a good flow.    PMH: Past Medical History:  Diagnosis Date  . Diabetes mellitus without complication ([place])   . Hypertension   . Kidney stones   . Renal disorder     Surgical History: Past Surgical History:  Procedure Laterality Date  . HERNIA REPAIR    . LITHOTRIPSY      Home Medications:  Allergies as of 05/16/2016   No Known Allergies     Medication List       Accurate as of 05/16/16  2:55 PM. Always use your most recent med list.          ALLEGRA ALLERGY 180 MG tablet Generic drug:  fexofenadine Take 180 mg by mouth daily.   aspirin 81 MG chewable tablet Chew 81 mg by mouth daily.   Chromium Picolinate 200 MCG Caps Take 200 mcg by mouth daily.   citalopram 20 MG tablet Commonly known as:  CELEXA Take 20 mg by mouth daily.   fenofibrate 160 MG tablet Take 160 mg by mouth daily.   fluticasone 50 MCG/ACT nasal spray Commonly known as:  FLONASE Place 2 sprays into both nostrils daily as needed for allergies.   fluticasone-salmeterol 115-21 MCG/ACT inhaler Commonly known as:  ADVAIR HFA Inhale 1 puff into the lungs daily as needed (shortness of breath, wheezing).   furosemide 20 MG tablet Commonly known as:  LASIX Take 20 mg by mouth daily.   gabapentin  100 MG capsule Commonly known as:  NEURONTIN Take 100-200 mg by mouth 2 (two) times daily. Take 100 mg every morning and 200 mg every night   GLUCOSAMINE CHONDROITIN COMPLX Caps Take 1 tablet by mouth 2 (two) times daily.   losartan 25 MG tablet Commonly known as:  COZAAR Take 25 mg by mouth daily.   Magnesium 500 MG Caps Take 500 mg by mouth daily.   methocarbamol 750 MG tablet Commonly known as:  ROBAXIN Take 750 mg by mouth daily as needed for muscle spasms.   metoprolol succinate 50 MG 24 hr tablet Commonly known as:  TOPROL-XL Take 50 mg by mouth daily.   niacin 500 MG tablet Take 500 mg by mouth daily.   pantoprazole 40 MG tablet Commonly known as:  PROTONIX Take 40 mg by mouth daily.   pravastatin 20 MG tablet Commonly known as:  PRAVACHOL Take 20 mg by mouth daily.   VENTOLIN HFA 108 (90 Base) MCG/ACT inhaler Generic drug:  albuterol Inhale 2 puffs into the lungs every 6 (six) hours as needed for wheezing or shortness of breath.   vitamin C 1000 MG tablet Take 1,000 mg by mouth daily.   VITAMIN D-1000 MAX ST 1000 units tablet Generic drug:  Cholecalciferol Take 1,000 Units by mouth daily.       Allergies: No Known Allergies  Family History: No family history on file.  Social History:  reports that he has never smoked. He does not have any smokeless tobacco history on file. He reports that he does not drink alcohol. His drug history is not on file.  ROS: UROLOGY Frequent Urination?: Yes Hard to postpone urination?: Yes Burning/pain with urination?: No Get up at night to urinate?: Yes Leakage of urine?: Yes Urine stream starts and stops?: No Trouble starting stream?: No Do you have to strain to urinate?: No Blood in urine?: No Urinary tract infection?: No Sexually transmitted disease?: No Injury to kidneys or bladder?: No Painful intercourse?: No Weak stream?: No Erection problems?: No Penile pain?: No  Gastrointestinal Nausea?:  No Vomiting?: No Indigestion/heartburn?: Yes Diarrhea?: No Constipation?: No  Constitutional Fever: No Night sweats?: No Weight loss?: No Fatigue?: Yes  Skin Skin rash/lesions?: No Itching?: No  Eyes Blurred vision?: No Double vision?: No  Ears/Nose/Throat Sore throat?: No Sinus problems?: Yes  Hematologic/Lymphatic Swollen glands?: No Easy bruising?: Yes  Cardiovascular Leg swelling?: Yes Chest pain?: No  Respiratory Cough?: Yes Shortness of breath?: Yes  Endocrine Excessive thirst?: Yes  Musculoskeletal Back pain?: Yes Joint pain?: Yes  Neurological Headaches?: No Dizziness?: No  Psychologic Depression?: No Anxiety?: No  Physical Exam: BP 120/76   Pulse 86   Ht 5\' 7"  (1.702 m)   Wt 107.9 kg (237 lb 14.4 oz)   BMI 37.26 kg/m   Constitutional:  Alert and oriented, No acute distress. HEENT: [place] AT, moist mucus membranes.  Trachea midline, no masses. Cardiovascular: No clubbing, cyanosis, or edema. Respiratory: Normal respiratory effort, no increased work of breathing. GI: Abdomen is soft, nontender, nondistended, no abdominal masses GU: No CVA tenderness. Penis normal, uncircumcised, nl foreskin. No phimosis. Testes on smaller side, palpably normal. No mass. DRE: prostate 30 gram, smooth  Skin: No rashes, bruises or suspicious lesions. Lymph: No cervical or inguinal adenopathy. Neurologic: Grossly intact, no focal deficits, moving all 4 extremities. Psychiatric: Normal mood and affect.  Laboratory Data: Lab Results  Component Value Date   WBC 7.9 07/27/2014   HGB 14.1 07/27/2014   HCT 41.2 07/27/2014   MCV 96 07/27/2014   PLT 276 07/27/2014    Lab Results  Component Value Date   CREATININE 1.64 (H) 07/27/2014    No results found for: PSA  No results found for: TESTOSTERONE  Lab Results  Component Value Date   HGBA1C 6.2 07/26/2012    Urinalysis    Component Value Date/Time   COLORURINE Yellow 07/27/2014 1247   APPEARANCEUR  Clear 07/27/2014 1247   LABSPEC 1.016 07/27/2014 1247   PHURINE 6.0 07/27/2014 1247   GLUCOSEU 50 mg/dL 07/27/2014 1247   HGBUR Negative 07/27/2014 1247   BILIRUBINUR Negative 07/27/2014 1247   KETONESUR Negative 07/27/2014 1247   PROTEINUR >=500 07/27/2014 1247   NITRITE Negative 07/27/2014 1247   LEUKOCYTESUR Negative 07/27/2014 1247    Assessment & Plan:    1) low T - check AM labs. I'd be concerned with CV risk and risk of worsening OSA in this pt with T replacement. Check confirmatory labs.   2) BPH, nocturia  - discussed nature r/b/a to tamsulosin and he elects to proceed. PVR next time.     There are no diagnoses linked to this encounter.  No Follow-up on file.  Festus Aloe, [facility] [address], [place], [place]  [address] [phone]  

## 2016-05-25 DIAGNOSIS — J449 Chronic obstructive pulmonary disease, unspecified: Secondary | ICD-10-CM | POA: Diagnosis not present

## 2016-05-30 ENCOUNTER — Other Ambulatory Visit: Payer: PPO

## 2016-05-30 DIAGNOSIS — R7989 Other specified abnormal findings of blood chemistry: Secondary | ICD-10-CM

## 2016-05-30 DIAGNOSIS — E291 Testicular hypofunction: Secondary | ICD-10-CM | POA: Diagnosis not present

## 2016-05-30 DIAGNOSIS — J449 Chronic obstructive pulmonary disease, unspecified: Secondary | ICD-10-CM | POA: Diagnosis not present

## 2016-06-01 ENCOUNTER — Telehealth: Payer: Self-pay

## 2016-06-01 LAB — FSH/LH
FSH: 12.2 m[IU]/mL (ref 1.5–12.4)
LH: 7.2 m[IU]/mL (ref 1.7–8.6)

## 2016-06-01 LAB — TESTOSTERONE, BIOAVAILABLE (M)
TESTOST., % FREE&WEAKLY BOUND: 11.3 % (ref 9.0–46.0)
TESTOST., F&W BOUND: 28.4 ng/dL — AB (ref 40.0–250.0)
TESTOSTERONE: 251 ng/dL — AB (ref 264–916)

## 2016-06-01 LAB — ESTRADIOL: Estradiol: 13.1 pg/mL (ref 7.6–42.6)

## 2016-06-01 LAB — PROLACTIN: Prolactin: 12.4 ng/mL (ref 4.0–15.2)

## 2016-06-01 NOTE — Telephone Encounter (Signed)
[name] Aloe, MD  Lestine Box, LPN        Notify patient his T remains low. He might be a good candidate for T replacement. F/u for PA or MD visit in the next few weeks to discuss. And he needs a PVR.    Spoke with pt in reference to testosterone. Pt was unsure about what testosterone replacement therapy was. Pt was transferred to the front to make a f/u appt to discuss.

## 2016-06-06 ENCOUNTER — Encounter: Payer: Self-pay | Admitting: Urology

## 2016-06-06 ENCOUNTER — Ambulatory Visit: Payer: PPO | Admitting: Urology

## 2016-06-06 VITALS — BP 105/62 | HR 83 | Ht 66.0 in | Wt 232.1 lb

## 2016-06-06 DIAGNOSIS — E291 Testicular hypofunction: Secondary | ICD-10-CM | POA: Diagnosis not present

## 2016-06-06 DIAGNOSIS — N400 Enlarged prostate without lower urinary tract symptoms: Secondary | ICD-10-CM

## 2016-06-06 LAB — BLADDER SCAN AMB NON-IMAGING: SCAN RESULT: 20

## 2016-06-06 MED ORDER — TESTOSTERONE CYPIONATE 200 MG/ML IM SOLN: 200.0000 mg | INTRAMUSCULAR | 1 refills | Status: DC

## 2016-06-06 NOTE — Progress Notes (Signed)
06/06/2016 2:06 PM   [facility] Momon Jul 22, 1945 [id number]  Referring provider: Perrin Maltese, MD [address] [place], [place] [address]  No chief complaint on file.   HPI:  71 yo referred for low T. Referred by Dr.Khan.  He complains of fatigue, no energy or initiative. He has weight gain. He has OSA and sleeps with a CPAP. He wears O2 and has COPD. A 04/14/2016 T was 204 and PSA 0.7. He has a normal libido. He has two kids. His DRE was normal Jan 2017.   He has occasional frequency, urgency, nocturia. He voids with a good flow. He saw Dr. Eliberto Ivory in the past for kidney stones. Now, he follows with nephrology.  He drinks a lot of water. He voids with a good flow.   Returns today to review endocrine evaluation. His total testosterone was 251 with a free testosterone of 28. His prolactin and estradiol were normal. 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     PMH: Past Medical History:  Diagnosis Date  . Diabetes mellitus without complication ([place])   . Hypertension   . Kidney stones   . Renal disorder     Surgical History: Past Surgical History:  Procedure Laterality Date  . HERNIA REPAIR    . LITHOTRIPSY      Home Medications:  Allergies as of 06/06/2016   No Known Allergies     Medication List       Accurate as of 06/06/16  2:06 PM. Always use your most recent med list.          ALLEGRA ALLERGY 180 MG tablet Generic drug:  fexofenadine Take 180 mg by mouth daily.   aspirin 81 MG chewable tablet Chew 81 mg by mouth daily.   Chromium Picolinate 200 MCG Caps Take 200 mcg by mouth daily.   citalopram 20 MG tablet Commonly known as:  CELEXA Take 20 mg by mouth daily.   fenofibrate 160 MG tablet Take 160 mg by mouth daily.   fluticasone 50 MCG/ACT nasal spray Commonly known as:  FLONASE Place 2 sprays into both nostrils daily as needed for allergies.   fluticasone-salmeterol 115-21 MCG/ACT inhaler Commonly known  as:  ADVAIR HFA Inhale 1 puff into the lungs daily as needed (shortness of breath, wheezing).   furosemide 20 MG tablet Commonly known as:  LASIX Take 20 mg by mouth daily.   gabapentin 100 MG capsule Commonly known as:  NEURONTIN Take 100-200 mg by mouth 2 (two) times daily. Take 100 mg every morning and 200 mg every night   GLUCOSAMINE CHONDROITIN COMPLX Caps Take 1 tablet by mouth 2 (two) times daily.   losartan 25 MG tablet Commonly known as:  COZAAR Take 25 mg by mouth daily.   Magnesium 500 MG Caps Take 500 mg by mouth daily.   methocarbamol 750 MG tablet Commonly known as:  ROBAXIN Take 750 mg by mouth daily as needed for muscle spasms.   metoprolol succinate 50 MG 24 hr tablet Commonly known as:  TOPROL-XL Take 50 mg by mouth daily.   niacin 500 MG tablet Take 500 mg by mouth daily.   pantoprazole 40 MG tablet Commonly known as:  PROTONIX Take 40 mg by mouth daily.   pravastatin 20 MG tablet Commonly known as:  PRAVACHOL Take 20 mg by mouth daily.   tamsulosin 0.4 MG Caps capsule Commonly known as:  FLOMAX Take 1 capsule (0.4 mg total) by mouth daily after supper.  VENTOLIN HFA 108 (90 Base) MCG/ACT inhaler Generic drug:  albuterol Inhale 2 puffs into the lungs every 6 (six) hours as needed for wheezing or shortness of breath.   vitamin C 1000 MG tablet Take 1,000 mg by mouth daily.   VITAMIN D-1000 MAX ST 1000 units tablet Generic drug:  Cholecalciferol Take 1,000 Units by mouth daily.       Allergies: No Known Allergies  Family History: No family history on file.  Social History:  reports that he has quit smoking. He quit smokeless tobacco use about 12 years ago. He reports that he does not drink alcohol. His drug history is not on file.  ROS: UROLOGY Frequent Urination?: Yes Hard to postpone urination?: No Burning/pain with urination?: No Get up at night to urinate?: No Leakage of urine?: No Urine stream starts and stops?:  No Trouble starting stream?: No Do you have to strain to urinate?: No Blood in urine?: No Urinary tract infection?: No Sexually transmitted disease?: No Injury to kidneys or bladder?: No Painful intercourse?: No Weak stream?: No Erection problems?: No Penile pain?: No  Gastrointestinal Nausea?: No Vomiting?: No Indigestion/heartburn?: No Diarrhea?: No Constipation?: No  Constitutional Fever: No Night sweats?: No Weight loss?: No Fatigue?: Yes  Skin Skin rash/lesions?: No Itching?: No  Eyes Blurred vision?: No Double vision?: No  Ears/Nose/Throat Sore throat?: No Sinus problems?: Yes  Hematologic/Lymphatic Swollen glands?: No Easy bruising?: No  Cardiovascular Leg swelling?: No Chest pain?: No  Respiratory Cough?: No Shortness of breath?: Yes  Endocrine Excessive thirst?: Yes  Musculoskeletal Back pain?: No Joint pain?: Yes  Neurological Headaches?: No Dizziness?: No  Psychologic Depression?: No Anxiety?: No  Physical Exam: BP 105/62   Pulse 83   Ht 5\' 6"  (1.676 m)   Wt 105.3 kg (232 lb 1.6 oz)   BMI 37.46 kg/m   Constitutional:  Alert and oriented, No acute distress. HEENT: [place] AT, moist mucus membranes.  Trachea midline, no masses. Cardiovascular: No clubbing, cyanosis, or edema. Respiratory: Normal respiratory effort, no increased work of breathing. GI: Abdomen is soft, nontender, nondistended, no abdominal masses GU: No CVA tenderness.  Skin: No rashes, bruises or suspicious lesions. Lymph: No cervical or inguinal adenopathy. Neurologic: Grossly intact, no focal deficits, moving all 4 extremities. Psychiatric: Normal mood and affect.  Laboratory Data: Lab Results  Component Value Date   WBC 7.9 07/27/2014   HGB 14.1 07/27/2014   HCT 41.2 07/27/2014   MCV 96 07/27/2014   PLT 276 07/27/2014    Lab Results  Component Value Date   CREATININE 1.64 (H) 07/27/2014    No results found for: PSA  Lab Results  Component Value  Date   TESTOSTERONE 251 (L) 05/30/2016    Lab Results  Component Value Date   HGBA1C 6.2 07/26/2012    Urinalysis    Component Value Date/Time   COLORURINE Yellow 07/27/2014 1247   APPEARANCEUR Clear 07/27/2014 1247   LABSPEC 1.016 07/27/2014 1247   PHURINE 6.0 07/27/2014 1247   GLUCOSEU 50 mg/dL 07/27/2014 1247   HGBUR Negative 07/27/2014 1247   BILIRUBINUR Negative 07/27/2014 1247   KETONESUR Negative 07/27/2014 1247   PROTEINUR >=500 07/27/2014 1247   NITRITE Negative 07/27/2014 1247   LEUKOCYTESUR Negative 07/27/2014 1247      Assessment & Plan:   1. Benign prostatic hyperplasia, unspecified whether LUTS - pvr normal.  - BLADDER SCAN AMB NON-IMAGING  2. Low T - I had a long discussion with patient on the nature, r/b/a to T replacement. I  printed the [facility] article is T right for me? And went over it. Also added FDA warnings on blood clot, DVT, MI and sudden death. He said he needs to gets more energy and feel better. I sent Rx for testosterone. His daughter is a CMA and gives IM injections. They want her to administer. They declined NV here.    No Follow-up on file.  Festus Aloe, [facility] [address], [place], [place] [address] [phone]

## 2016-06-07 ENCOUNTER — Telehealth: Payer: Self-pay | Admitting: *Deleted

## 2016-06-07 NOTE — Telephone Encounter (Signed)
Tried to call patient no answer, voicemail not set up.

## 2016-06-07 NOTE — Telephone Encounter (Signed)
-----   Message from Festus Aloe, MD sent at 06/04/2016  6:03 PM EST ----- His T is low and FSH/LH are high. F/u to discuss / review    ----- Message ----- From: Orlene Erm, CMA Sent: 06/02/2016   8:28 AM To: Festus Aloe, MD    ----- Message ----- From: Lavone Neri Lab Results In Sent: 05/31/2016   7:44 AM To: Rowe Robert Clinical

## 2016-06-08 NOTE — Telephone Encounter (Signed)
Spoke with pt in reference to lab results. Pt voiced understanding.  

## 2016-06-09 DIAGNOSIS — G4730 Sleep apnea, unspecified: Secondary | ICD-10-CM | POA: Diagnosis not present

## 2016-06-09 DIAGNOSIS — G4733 Obstructive sleep apnea (adult) (pediatric): Secondary | ICD-10-CM | POA: Diagnosis not present

## 2016-06-20 DIAGNOSIS — I2510 Atherosclerotic heart disease of native coronary artery without angina pectoris: Secondary | ICD-10-CM | POA: Diagnosis not present

## 2016-06-20 DIAGNOSIS — I10 Essential (primary) hypertension: Secondary | ICD-10-CM | POA: Diagnosis not present

## 2016-06-20 DIAGNOSIS — R0602 Shortness of breath: Secondary | ICD-10-CM | POA: Diagnosis not present

## 2016-06-20 DIAGNOSIS — J449 Chronic obstructive pulmonary disease, unspecified: Secondary | ICD-10-CM | POA: Diagnosis not present

## 2016-06-20 DIAGNOSIS — E782 Mixed hyperlipidemia: Secondary | ICD-10-CM | POA: Diagnosis not present

## 2016-06-20 DIAGNOSIS — G4733 Obstructive sleep apnea (adult) (pediatric): Secondary | ICD-10-CM | POA: Diagnosis not present

## 2016-06-20 DIAGNOSIS — E119 Type 2 diabetes mellitus without complications: Secondary | ICD-10-CM | POA: Diagnosis not present

## 2016-06-20 DIAGNOSIS — R55 Syncope and collapse: Secondary | ICD-10-CM | POA: Diagnosis not present

## 2016-06-20 DIAGNOSIS — R079 Chest pain, unspecified: Secondary | ICD-10-CM | POA: Diagnosis not present

## 2016-06-21 DIAGNOSIS — R079 Chest pain, unspecified: Secondary | ICD-10-CM | POA: Diagnosis not present

## 2016-06-21 DIAGNOSIS — E782 Mixed hyperlipidemia: Secondary | ICD-10-CM | POA: Diagnosis not present

## 2016-06-21 DIAGNOSIS — I2510 Atherosclerotic heart disease of native coronary artery without angina pectoris: Secondary | ICD-10-CM | POA: Diagnosis not present

## 2016-06-21 DIAGNOSIS — R0602 Shortness of breath: Secondary | ICD-10-CM | POA: Diagnosis not present

## 2016-06-21 DIAGNOSIS — I10 Essential (primary) hypertension: Secondary | ICD-10-CM | POA: Diagnosis not present

## 2016-06-22 DIAGNOSIS — J449 Chronic obstructive pulmonary disease, unspecified: Secondary | ICD-10-CM | POA: Diagnosis not present

## 2016-06-26 DIAGNOSIS — R0602 Shortness of breath: Secondary | ICD-10-CM | POA: Diagnosis not present

## 2016-06-26 DIAGNOSIS — I2510 Atherosclerotic heart disease of native coronary artery without angina pectoris: Secondary | ICD-10-CM | POA: Diagnosis not present

## 2016-06-26 DIAGNOSIS — I10 Essential (primary) hypertension: Secondary | ICD-10-CM | POA: Diagnosis not present

## 2016-06-26 DIAGNOSIS — G4733 Obstructive sleep apnea (adult) (pediatric): Secondary | ICD-10-CM | POA: Diagnosis not present

## 2016-06-26 DIAGNOSIS — E782 Mixed hyperlipidemia: Secondary | ICD-10-CM | POA: Diagnosis not present

## 2016-06-27 DIAGNOSIS — J449 Chronic obstructive pulmonary disease, unspecified: Secondary | ICD-10-CM | POA: Diagnosis not present

## 2016-07-04 ENCOUNTER — Other Ambulatory Visit: Payer: PPO

## 2016-07-04 DIAGNOSIS — E291 Testicular hypofunction: Secondary | ICD-10-CM

## 2016-07-04 DIAGNOSIS — I129 Hypertensive chronic kidney disease with stage 1 through stage 4 chronic kidney disease, or unspecified chronic kidney disease: Secondary | ICD-10-CM | POA: Diagnosis not present

## 2016-07-04 DIAGNOSIS — N183 Chronic kidney disease, stage 3 (moderate): Secondary | ICD-10-CM | POA: Diagnosis not present

## 2016-07-04 DIAGNOSIS — R809 Proteinuria, unspecified: Secondary | ICD-10-CM | POA: Diagnosis not present

## 2016-07-04 DIAGNOSIS — E1122 Type 2 diabetes mellitus with diabetic chronic kidney disease: Secondary | ICD-10-CM | POA: Diagnosis not present

## 2016-07-04 DIAGNOSIS — I509 Heart failure, unspecified: Secondary | ICD-10-CM | POA: Diagnosis not present

## 2016-07-05 ENCOUNTER — Inpatient Hospital Stay
Admission: EM | Admit: 2016-07-05 | Discharge: 2016-07-11 | DRG: 291 | Disposition: A | Discharge status: Other Acute Inpatient Hospital | Payer: PPO | Attending: Internal Medicine | Admitting: Internal Medicine

## 2016-07-05 ENCOUNTER — Telehealth: Payer: Self-pay

## 2016-07-05 ENCOUNTER — Emergency Department: Payer: PPO

## 2016-07-05 ENCOUNTER — Encounter: Payer: Self-pay | Admitting: Emergency Medicine

## 2016-07-05 DIAGNOSIS — N183 Chronic kidney disease, stage 3 (moderate): Secondary | ICD-10-CM | POA: Diagnosis present

## 2016-07-05 DIAGNOSIS — Z79899 Other long term (current) drug therapy: Secondary | ICD-10-CM | POA: Diagnosis not present

## 2016-07-05 DIAGNOSIS — I248 Other forms of acute ischemic heart disease: Secondary | ICD-10-CM | POA: Diagnosis present

## 2016-07-05 DIAGNOSIS — I350 Nonrheumatic aortic (valve) stenosis: Secondary | ICD-10-CM | POA: Diagnosis not present

## 2016-07-05 DIAGNOSIS — I5043 Acute on chronic combined systolic (congestive) and diastolic (congestive) heart failure: Secondary | ICD-10-CM | POA: Diagnosis not present

## 2016-07-05 DIAGNOSIS — J9621 Acute and chronic respiratory failure with hypoxia: Secondary | ICD-10-CM | POA: Diagnosis not present

## 2016-07-05 DIAGNOSIS — J9690 Respiratory failure, unspecified, unspecified whether with hypoxia or hypercapnia: Secondary | ICD-10-CM

## 2016-07-05 DIAGNOSIS — E785 Hyperlipidemia, unspecified: Secondary | ICD-10-CM | POA: Diagnosis present

## 2016-07-05 DIAGNOSIS — E1122 Type 2 diabetes mellitus with diabetic chronic kidney disease: Secondary | ICD-10-CM | POA: Diagnosis not present

## 2016-07-05 DIAGNOSIS — I130 Hypertensive heart and chronic kidney disease with heart failure and stage 1 through stage 4 chronic kidney disease, or unspecified chronic kidney disease: Principal | ICD-10-CM | POA: Diagnosis present

## 2016-07-05 DIAGNOSIS — I517 Cardiomegaly: Secondary | ICD-10-CM | POA: Diagnosis not present

## 2016-07-05 DIAGNOSIS — G4733 Obstructive sleep apnea (adult) (pediatric): Secondary | ICD-10-CM | POA: Diagnosis not present

## 2016-07-05 DIAGNOSIS — E1165 Type 2 diabetes mellitus with hyperglycemia: Secondary | ICD-10-CM | POA: Diagnosis present

## 2016-07-05 DIAGNOSIS — Z9981 Dependence on supplemental oxygen: Secondary | ICD-10-CM

## 2016-07-05 DIAGNOSIS — F329 Major depressive disorder, single episode, unspecified: Secondary | ICD-10-CM | POA: Diagnosis not present

## 2016-07-05 DIAGNOSIS — Z87442 Personal history of urinary calculi: Secondary | ICD-10-CM | POA: Diagnosis not present

## 2016-07-05 DIAGNOSIS — R140 Abdominal distension (gaseous): Secondary | ICD-10-CM | POA: Diagnosis not present

## 2016-07-05 DIAGNOSIS — D631 Anemia in chronic kidney disease: Secondary | ICD-10-CM | POA: Diagnosis not present

## 2016-07-05 DIAGNOSIS — R768 Other specified abnormal immunological findings in serum: Secondary | ICD-10-CM | POA: Diagnosis not present

## 2016-07-05 DIAGNOSIS — K5900 Constipation, unspecified: Secondary | ICD-10-CM | POA: Diagnosis not present

## 2016-07-05 DIAGNOSIS — E1121 Type 2 diabetes mellitus with diabetic nephropathy: Secondary | ICD-10-CM | POA: Diagnosis not present

## 2016-07-05 DIAGNOSIS — Z7951 Long term (current) use of inhaled steroids: Secondary | ICD-10-CM

## 2016-07-05 DIAGNOSIS — N19 Unspecified kidney failure: Secondary | ICD-10-CM

## 2016-07-05 DIAGNOSIS — R0602 Shortness of breath: Secondary | ICD-10-CM | POA: Diagnosis not present

## 2016-07-05 DIAGNOSIS — Z7722 Contact with and (suspected) exposure to environmental tobacco smoke (acute) (chronic): Secondary | ICD-10-CM | POA: Diagnosis present

## 2016-07-05 DIAGNOSIS — F419 Anxiety disorder, unspecified: Secondary | ICD-10-CM | POA: Diagnosis not present

## 2016-07-05 DIAGNOSIS — E669 Obesity, unspecified: Secondary | ICD-10-CM | POA: Diagnosis present

## 2016-07-05 DIAGNOSIS — Z452 Encounter for adjustment and management of vascular access device: Secondary | ICD-10-CM | POA: Diagnosis not present

## 2016-07-05 DIAGNOSIS — R0600 Dyspnea, unspecified: Secondary | ICD-10-CM | POA: Diagnosis not present

## 2016-07-05 DIAGNOSIS — K219 Gastro-esophageal reflux disease without esophagitis: Secondary | ICD-10-CM | POA: Diagnosis not present

## 2016-07-05 DIAGNOSIS — I5021 Acute systolic (congestive) heart failure: Secondary | ICD-10-CM | POA: Diagnosis present

## 2016-07-05 DIAGNOSIS — J9600 Acute respiratory failure, unspecified whether with hypoxia or hypercapnia: Secondary | ICD-10-CM | POA: Diagnosis not present

## 2016-07-05 DIAGNOSIS — J9601 Acute respiratory failure with hypoxia: Secondary | ICD-10-CM | POA: Diagnosis not present

## 2016-07-05 DIAGNOSIS — J810 Acute pulmonary edema: Secondary | ICD-10-CM

## 2016-07-05 DIAGNOSIS — J9622 Acute and chronic respiratory failure with hypercapnia: Secondary | ICD-10-CM | POA: Diagnosis not present

## 2016-07-05 DIAGNOSIS — Z85828 Personal history of other malignant neoplasm of skin: Secondary | ICD-10-CM | POA: Diagnosis not present

## 2016-07-05 DIAGNOSIS — Z7982 Long term (current) use of aspirin: Secondary | ICD-10-CM

## 2016-07-05 DIAGNOSIS — E875 Hyperkalemia: Secondary | ICD-10-CM | POA: Diagnosis present

## 2016-07-05 DIAGNOSIS — I2582 Chronic total occlusion of coronary artery: Secondary | ICD-10-CM | POA: Diagnosis not present

## 2016-07-05 DIAGNOSIS — I472 Ventricular tachycardia: Secondary | ICD-10-CM | POA: Diagnosis not present

## 2016-07-05 DIAGNOSIS — I2510 Atherosclerotic heart disease of native coronary artery without angina pectoris: Secondary | ICD-10-CM | POA: Diagnosis present

## 2016-07-05 DIAGNOSIS — I509 Heart failure, unspecified: Secondary | ICD-10-CM | POA: Diagnosis not present

## 2016-07-05 DIAGNOSIS — R918 Other nonspecific abnormal finding of lung field: Secondary | ICD-10-CM | POA: Diagnosis not present

## 2016-07-05 DIAGNOSIS — Z87891 Personal history of nicotine dependence: Secondary | ICD-10-CM | POA: Diagnosis not present

## 2016-07-05 DIAGNOSIS — Z7984 Long term (current) use of oral hypoglycemic drugs: Secondary | ICD-10-CM | POA: Diagnosis not present

## 2016-07-05 DIAGNOSIS — J449 Chronic obstructive pulmonary disease, unspecified: Secondary | ICD-10-CM | POA: Diagnosis not present

## 2016-07-05 DIAGNOSIS — G8929 Other chronic pain: Secondary | ICD-10-CM | POA: Diagnosis not present

## 2016-07-05 DIAGNOSIS — N179 Acute kidney failure, unspecified: Secondary | ICD-10-CM | POA: Diagnosis not present

## 2016-07-05 DIAGNOSIS — R809 Proteinuria, unspecified: Secondary | ICD-10-CM | POA: Diagnosis not present

## 2016-07-05 DIAGNOSIS — R0609 Other forms of dyspnea: Secondary | ICD-10-CM | POA: Diagnosis not present

## 2016-07-05 DIAGNOSIS — N184 Chronic kidney disease, stage 4 (severe): Secondary | ICD-10-CM | POA: Diagnosis not present

## 2016-07-05 DIAGNOSIS — J441 Chronic obstructive pulmonary disease with (acute) exacerbation: Secondary | ICD-10-CM | POA: Diagnosis present

## 2016-07-05 DIAGNOSIS — I959 Hypotension, unspecified: Secondary | ICD-10-CM | POA: Diagnosis not present

## 2016-07-05 DIAGNOSIS — R197 Diarrhea, unspecified: Secondary | ICD-10-CM | POA: Diagnosis not present

## 2016-07-05 DIAGNOSIS — M129 Arthropathy, unspecified: Secondary | ICD-10-CM | POA: Diagnosis not present

## 2016-07-05 DIAGNOSIS — I38 Endocarditis, valve unspecified: Secondary | ICD-10-CM | POA: Diagnosis not present

## 2016-07-05 DIAGNOSIS — Z992 Dependence on renal dialysis: Secondary | ICD-10-CM

## 2016-07-05 DIAGNOSIS — I5031 Acute diastolic (congestive) heart failure: Secondary | ICD-10-CM | POA: Diagnosis not present

## 2016-07-05 DIAGNOSIS — Z6836 Body mass index (BMI) 36.0-36.9, adult: Secondary | ICD-10-CM

## 2016-07-05 HISTORY — DX: Respiratory failure, unspecified, unspecified whether with hypoxia or hypercapnia: J96.90

## 2016-07-05 HISTORY — DX: Heart failure, unspecified: I50.9

## 2016-07-05 HISTORY — DX: Chronic obstructive pulmonary disease, unspecified: J44.9

## 2016-07-05 LAB — BLOOD GAS, ARTERIAL
Acid-Base Excess: 0.7 mmol/L (ref 0.0–2.0)
Bicarbonate: 27.1 mmol/L (ref 20.0–28.0)
Delivery systems: POSITIVE
EXPIRATORY PAP: 8
FIO2: 0.5
Inspiratory PAP: 16
LHR: 8 {breaths}/min
O2 SAT: 94.7 %
PCO2 ART: 49 mmHg — AB (ref 32.0–48.0)
PH ART: 7.35 (ref 7.350–7.450)
PO2 ART: 78 mmHg — AB (ref 83.0–108.0)
Patient temperature: 37

## 2016-07-05 LAB — CBC WITH DIFFERENTIAL/PLATELET
BASOS ABS: 0 10*3/uL (ref 0–0.1)
Basophils Relative: 0 %
EOS PCT: 0 %
Eosinophils Absolute: 0 10*3/uL (ref 0–0.7)
HEMATOCRIT: 45.7 % (ref 40.0–52.0)
Hemoglobin: 15.5 g/dL (ref 13.0–18.0)
LYMPHS PCT: 24 %
Lymphs Abs: 2.4 10*3/uL (ref 1.0–3.6)
MCH: 34 pg (ref 26.0–34.0)
MCHC: 33.9 g/dL (ref 32.0–36.0)
MCV: 100.1 fL — AB (ref 80.0–100.0)
MONO ABS: 0.7 10*3/uL (ref 0.2–1.0)
MONOS PCT: 7 %
Neutro Abs: 6.7 10*3/uL — ABNORMAL HIGH (ref 1.4–6.5)
Neutrophils Relative %: 69 %
PLATELETS: 218 10*3/uL (ref 150–440)
RBC: 4.57 MIL/uL (ref 4.40–5.90)
RDW: 15.7 % — AB (ref 11.5–14.5)
WBC: 9.8 10*3/uL (ref 3.8–10.6)

## 2016-07-05 LAB — PROCALCITONIN: Procalcitonin: <0.1 ng/mL

## 2016-07-05 LAB — COMPREHENSIVE METABOLIC PANEL
ALT: 18 U/L (ref 17–63)
ANION GAP: 9 (ref 5–15)
AST: 29 U/L (ref 15–41)
Albumin: 3.9 g/dL (ref 3.5–5.0)
Alkaline Phosphatase: 38 U/L (ref 38–126)
BILIRUBIN TOTAL: 1 mg/dL (ref 0.3–1.2)
BUN: 38 mg/dL — ABNORMAL HIGH (ref 6–20)
CHLORIDE: 103 mmol/L (ref 101–111)
CO2: 28 mmol/L (ref 22–32)
Calcium: 9.2 mg/dL (ref 8.9–10.3)
Creatinine, Ser: 2.28 mg/dL — ABNORMAL HIGH (ref 0.61–1.24)
GFR, EST AFRICAN AMERICAN: 31 mL/min — AB (ref >60)
GFR, EST NON AFRICAN AMERICAN: 27 mL/min — AB (ref >60)
Glucose, Bld: 198 mg/dL — ABNORMAL HIGH (ref 65–99)
POTASSIUM: 4.9 mmol/L (ref 3.5–5.1)
Sodium: 140 mmol/L (ref 135–145)
TOTAL PROTEIN: 7.9 g/dL (ref 6.5–8.1)

## 2016-07-05 LAB — MRSA PCR SCREENING: MRSA BY PCR: NEGATIVE

## 2016-07-05 LAB — INFLUENZA PANEL BY PCR (TYPE A & B)
INFLAPCR: NEGATIVE
Influenza B By PCR: NEGATIVE

## 2016-07-05 LAB — TROPONIN I
TROPONIN I: 0.1 ng/mL — AB (ref <0.03)
TROPONIN I: 0.11 ng/mL — AB (ref <0.03)
TROPONIN I: 0.12 ng/mL — AB (ref <0.03)

## 2016-07-05 LAB — GLUCOSE, CAPILLARY
GLUCOSE-CAPILLARY: 167 mg/dL — AB (ref 65–99)
Glucose-Capillary: 158 mg/dL — ABNORMAL HIGH (ref 65–99)
Glucose-Capillary: 196 mg/dL — ABNORMAL HIGH (ref 65–99)
Glucose-Capillary: 160 mg/dL — ABNORMAL HIGH (ref 65–99)

## 2016-07-05 LAB — POTASSIUM: Potassium: 4.8 mmol/L (ref 3.5–5.1)

## 2016-07-05 LAB — TESTOSTERONE: TESTOSTERONE: 953 ng/dL — AB (ref 264–916)

## 2016-07-05 LAB — BRAIN NATRIURETIC PEPTIDE: B NATRIURETIC PEPTIDE 5: 445 pg/mL — AB (ref 0.0–100.0)

## 2016-07-05 MED ORDER — NITROGLYCERIN IN D5W 200-5 MCG/ML-% IV SOLN
10.0000 ug/min | INTRAVENOUS | Status: DC
  Administered 2016-07-05: 10 ug/min via INTRAVENOUS
  Filled 2016-07-05: qty 250

## 2016-07-05 MED ORDER — SODIUM CHLORIDE 0.9% FLUSH
3.0000 mL | Freq: Two times a day (BID) | INTRAVENOUS | Status: DC
  Administered 2016-07-05 – 2016-07-10 (×12): 3 mL via INTRAVENOUS

## 2016-07-05 MED ORDER — HEPARIN SODIUM (PORCINE) 5000 UNIT/ML IJ SOLN
5000.0000 [IU] | Freq: Three times a day (TID) | INTRAMUSCULAR | Status: DC
  Administered 2016-07-05 – 2016-07-10 (×17): 5000 [IU] via SUBCUTANEOUS
  Filled 2016-07-05 (×18): qty 1

## 2016-07-05 MED ORDER — SODIUM CHLORIDE 0.9 % IV SOLN: 250.0000 mL | INTRAVENOUS | Status: DC | PRN

## 2016-07-05 MED ORDER — DEXTROSE 5 % IV SOLN
1.0000 g | INTRAVENOUS | Status: DC
  Administered 2016-07-05: 1 g via INTRAVENOUS
  Filled 2016-07-05: qty 10

## 2016-07-05 MED ORDER — ENOXAPARIN SODIUM 40 MG/0.4ML ~~LOC~~ SOLN: 40.0000 mg | SUBCUTANEOUS | Status: DC

## 2016-07-05 MED ORDER — FUROSEMIDE 10 MG/ML IJ SOLN
40.0000 mg | Freq: Once | INTRAMUSCULAR | Status: AC
  Administered 2016-07-05: 40 mg via INTRAVENOUS
  Filled 2016-07-05: qty 4

## 2016-07-05 MED ORDER — IPRATROPIUM-ALBUTEROL 0.5-2.5 (3) MG/3ML IN SOLN
3.0000 mL | RESPIRATORY_TRACT | Status: DC
  Administered 2016-07-05 – 2016-07-08 (×18): 3 mL via RESPIRATORY_TRACT
  Filled 2016-07-05 (×18): qty 3

## 2016-07-05 MED ORDER — BUDESONIDE 0.5 MG/2ML IN SUSP
0.5000 mg | Freq: Two times a day (BID) | RESPIRATORY_TRACT | Status: DC
  Administered 2016-07-05 – 2016-07-10 (×12): 0.5 mg via RESPIRATORY_TRACT
  Filled 2016-07-05 (×13): qty 2

## 2016-07-05 MED ORDER — ORAL CARE MOUTH RINSE
15.0000 mL | Freq: Two times a day (BID) | OROMUCOSAL | Status: DC
  Administered 2016-07-05 – 2016-07-10 (×8): 15 mL via OROMUCOSAL

## 2016-07-05 MED ORDER — METHYLPREDNISOLONE SODIUM SUCC 40 MG IJ SOLR
40.0000 mg | Freq: Two times a day (BID) | INTRAMUSCULAR | Status: DC
  Administered 2016-07-05 – 2016-07-10 (×11): 40 mg via INTRAVENOUS
  Filled 2016-07-05 (×11): qty 1

## 2016-07-05 MED ORDER — METHYLPREDNISOLONE SODIUM SUCC 125 MG IJ SOLR
125.0000 mg | Freq: Once | INTRAMUSCULAR | Status: AC
  Administered 2016-07-05: 125 mg via INTRAVENOUS

## 2016-07-05 MED ORDER — AZITHROMYCIN 500 MG IV SOLR
500.0000 mg | INTRAVENOUS | Status: DC
  Administered 2016-07-05: 500 mg via INTRAVENOUS
  Filled 2016-07-05: qty 500

## 2016-07-05 MED ORDER — ONDANSETRON HCL 4 MG/2ML IJ SOLN: 4.0000 mg | Freq: Four times a day (QID) | INTRAMUSCULAR | Status: DC | PRN

## 2016-07-05 MED ORDER — FUROSEMIDE 10 MG/ML IJ SOLN
8.0000 mg/h | INTRAVENOUS | Status: DC
  Administered 2016-07-05: 8 mg/h via INTRAVENOUS
  Filled 2016-07-05: qty 25

## 2016-07-05 MED ORDER — FAMOTIDINE IN NACL 20-0.9 MG/50ML-% IV SOLN
20.0000 mg | Freq: Two times a day (BID) | INTRAVENOUS | Status: DC
  Administered 2016-07-05 (×2): 20 mg via INTRAVENOUS
  Filled 2016-07-05 (×2): qty 50

## 2016-07-05 MED ORDER — INSULIN ASPART 100 UNIT/ML ~~LOC~~ SOLN
0.0000 [IU] | SUBCUTANEOUS | Status: DC
  Administered 2016-07-05 – 2016-07-06 (×5): 2 [IU] via SUBCUTANEOUS
  Administered 2016-07-06: 1 [IU] via SUBCUTANEOUS
  Administered 2016-07-06: 3 [IU] via SUBCUTANEOUS
  Administered 2016-07-06: 2 [IU] via SUBCUTANEOUS
  Administered 2016-07-06: 1 [IU] via SUBCUTANEOUS
  Administered 2016-07-06 – 2016-07-07 (×2): 2 [IU] via SUBCUTANEOUS
  Administered 2016-07-07 (×2): 1 [IU] via SUBCUTANEOUS
  Administered 2016-07-07 – 2016-07-08 (×3): 2 [IU] via SUBCUTANEOUS
  Administered 2016-07-08: 1 [IU] via SUBCUTANEOUS
  Administered 2016-07-08: 2 [IU] via SUBCUTANEOUS
  Administered 2016-07-09: 1 [IU] via SUBCUTANEOUS
  Administered 2016-07-09 (×2): 2 [IU] via SUBCUTANEOUS
  Administered 2016-07-09 (×3): 1 [IU] via SUBCUTANEOUS
  Administered 2016-07-10: 2 [IU] via SUBCUTANEOUS
  Administered 2016-07-10 (×2): 3 [IU] via SUBCUTANEOUS
  Administered 2016-07-10 (×2): 2 [IU] via SUBCUTANEOUS
  Administered 2016-07-10: 1 [IU] via SUBCUTANEOUS
  Filled 2016-07-05: qty 3
  Filled 2016-07-05 (×2): qty 2
  Filled 2016-07-05 (×4): qty 1
  Filled 2016-07-05 (×3): qty 2
  Filled 2016-07-05: qty 3
  Filled 2016-07-05: qty 2
  Filled 2016-07-05: qty 1
  Filled 2016-07-05 (×3): qty 2
  Filled 2016-07-05 (×2): qty 1
  Filled 2016-07-05: qty 3
  Filled 2016-07-05 (×3): qty 2
  Filled 2016-07-05 (×2): qty 1
  Filled 2016-07-05 (×3): qty 2
  Filled 2016-07-05: qty 1
  Filled 2016-07-05 (×2): qty 2

## 2016-07-05 MED ORDER — ACETAMINOPHEN 325 MG PO TABS: 650.0000 mg | ORAL_TABLET | ORAL | Status: DC | PRN

## 2016-07-05 MED ORDER — MAGNESIUM SULFATE 2 GM/50ML IV SOLN
2.0000 g | Freq: Once | INTRAVENOUS | Status: AC
  Administered 2016-07-05: 2 g via INTRAVENOUS

## 2016-07-05 MED ORDER — DEXTROSE 5 % IV SOLN: 1.0000 g | INTRAVENOUS | Status: DC

## 2016-07-05 MED ORDER — DEXTROSE 5 % IV SOLN
500.0000 mg | INTRAVENOUS | Status: DC
  Filled 2016-07-05: qty 500

## 2016-07-05 MED ORDER — SODIUM CHLORIDE 0.9% FLUSH
3.0000 mL | INTRAVENOUS | Status: DC | PRN
  Administered 2016-07-06: 3 mL via INTRAVENOUS
  Filled 2016-07-05: qty 3

## 2016-07-05 NOTE — Progress Notes (Signed)
Inpatient Diabetes Program Recommendations  AACE/ADA: New Consensus Statement on Inpatient Glycemic Control (2015)  Target Ranges:  Prepandial:   less than 140 mg/dL      Peak postprandial:   less than 180 mg/dL (1-2 hours)      Critically ill patients:  140 - 180 mg/dL   Results for VINT, POLA (MRN [id number]) as of 07/05/2016 11:30  Ref. Range 07/05/2016 10:26 07/05/2016 11:09  Glucose-Capillary Latest Ref Range: 65 - 99 mg/dL 167 (H) 158 (H)  Results for JAYMOND, WAAGE (MRN [id number]) as of 07/05/2016 11:30  Ref. Range 07/05/2016 08:25  Glucose Latest Ref Range: 65 - 99 mg/dL 198 (H)   Review of Glycemic Control  Diabetes history: DM2 Outpatient Diabetes medications: Amaryl 2 mg BID Current orders for Inpatient glycemic control: None  Inpatient Diabetes Program Recommendations: Correction (SSI): Please order CBGs with Novolog correction scale Q4H (and when diet ordered, change frequency to ACHS). HgbA1C: Please consider ordering an A1C to evaluate glycemic control over the past 2-3 months.  Thanks, Barnie Alderman, RN, MSN, CDE Diabetes Coordinator Inpatient Diabetes Program [phone] (Team Pager from 8am to 5pm)

## 2016-07-05 NOTE — Telephone Encounter (Signed)
Festus Aloe, MD  Lestine Box, LPN        Notify patient his 3/13 T was normal - give result. When was his last T injection   No answer. No vm set up.

## 2016-07-05 NOTE — ED Notes (Signed)
Pt is more drowsy than arrival. Dr Darl Householder aware of this as well as sats being decreased. RT at bedside.

## 2016-07-05 NOTE — ED Triage Notes (Signed)
Began with [facility] last night and worse today. Arrived on cpap. Sat 70 with ems on 15 L. Increased WOB.

## 2016-07-05 NOTE — Progress Notes (Signed)
  Patient is resting and sleeping, sats dropped while asleep, pt still will arouse and answer questions. Sats increased to 93% on the 60% fio2.

## 2016-07-05 NOTE — Progress Notes (Signed)
Patient transferred from ER to ICU room 5 while on the V60 BiPAP with no complications.

## 2016-07-05 NOTE — Progress Notes (Signed)
[facility]  ROUNDING NOTE   Subjective:  Patient just seen in the office yesterday. He now presents with significant volume overload and pulmonary edema. He has acute respiratory failure and is maintained on BiPAP. Patient reports increasing shortness of breath over the past 3 weeks. He was started on spironolactone yesterday. Unfortunately his breathing worsened.   Objective:  Vital signs in last 24 hours:  Temp:  [97.7 F (36.5 C)-98.3 F (36.8 C)] 97.7 F (36.5 C) (03/14 1600) Pulse Rate:  [87-109] 109 (03/14 1700) Resp:  [14-34] 26 (03/14 1700) BP: (76-133)/(50-82) 91/62 (03/14 1700) SpO2:  [86 %-100 %] 96 % (03/14 1700) FiO2 (%):  [35 %-60 %] 35 % (03/14 1554) Weight:  [104.3 kg (230 lb)-107.8 kg (237 lb 10.5 oz)] 107.8 kg (237 lb 10.5 oz) (03/14 1032)  Weight change:  Filed Weights   07/05/16 0826 07/05/16 1032  Weight: 104.3 kg (230 lb) 107.8 kg (237 lb 10.5 oz)    Intake/Output: No intake/output data recorded.   Intake/Output this shift:  Total I/O In: 590 [P.O.:240; IV Piggyback:350] Out: 400 [Urine:400]  Physical Exam: General: Critically ill appearing   Head: Normocephalic, atraumatic. Moist oral mucosal membranes  Eyes: Anicteric  Neck: Supple, trachea midline  Lungs:  Rales bilateral, currently on BiPAP   Heart: S1S2 no rubs  Abdomen:  Soft, nontender, bowel sounds present  Extremities: 2+ peripheral edema.  Neurologic: Nonfocal, moving all four extremities  Skin: No lesions       Basic Metabolic Panel:  Recent Labs Lab 07/05/16 0825  NA 140  K 4.9  CL 103  CO2 28  GLUCOSE 198*  BUN 38*  CREATININE 2.28*  CALCIUM 9.2    Liver Function Tests:  Recent Labs Lab 07/05/16 0825  AST 29  ALT 18  ALKPHOS 38  BILITOT 1.0  PROT 7.9  ALBUMIN 3.9   No results for input(s): LIPASE, AMYLASE in the last 168 hours. No results for input(s): AMMONIA in the last 168 hours.  CBC:  Recent Labs Lab 07/05/16 0825  WBC 9.8   NEUTROABS 6.7*  HGB 15.5  HCT 45.7  MCV 100.1*  PLT 218    Cardiac Enzymes:  Recent Labs Lab 07/05/16 0825 07/05/16 1533  TROPONINI 0.10* 0.11*    BNP: Invalid input(s): POCBNP  CBG:  Recent Labs Lab 07/05/16 1026 07/05/16 1109 07/05/16 1623  GLUCAP 167* 158* 196*    Microbiology: Results for orders placed or performed during the hospital encounter of 07/05/16  MRSA PCR Screening     Status: None   Collection Time: 07/05/16 10:29 AM  Result Value Ref Range Status   MRSA by PCR NEGATIVE NEGATIVE Final    Comment:        The GeneXpert MRSA Assay (FDA approved for NASAL specimens only), is one component of a comprehensive MRSA colonization surveillance program. It is not intended to diagnose MRSA infection nor to guide or monitor treatment for MRSA infections.     Coagulation Studies: No results for input(s): LABPROT, INR in the last 72 hours.  Urinalysis: No results for input(s): COLORURINE, LABSPEC, PHURINE, GLUCOSEU, HGBUR, BILIRUBINUR, KETONESUR, PROTEINUR, UROBILINOGEN, NITRITE, LEUKOCYTESUR in the last 72 hours.  Invalid input(s): APPERANCEUR    Imaging: Dg Chest Port 1 View  Result Date: 07/05/2016 CLINICAL DATA:  Onset of shortness of breath last night made worse today, on CPAP, oxygen saturations 70% on 15 L. Increased work of breathing. History of COPD, CHF, former smoker. EXAM: PORTABLE CHEST 1 VIEW COMPARISON:  Chest  x-ray of February 15, 2016 FINDINGS: The lungs are well-expanded. The interstitial markings are increased. The pulmonary vascularity is engorged. The cardiac silhouette is enlarged. There small bilateral pleural effusions. There is no pneumothorax. IMPRESSION: CHF with interstitial edema superimposed upon COPD. No discrete pneumonia. Electronically Signed   By: David  Martinique M.D.   On: 07/05/2016 08:45     Medications:    . [START ON 07/06/2016] azithromycin  500 mg Intravenous Q24H  . budesonide (PULMICORT) nebulizer solution   0.5 mg Nebulization BID  . famotidine (PEPCID) IV  20 mg Intravenous Q12H  . heparin  5,000 Units Subcutaneous Q8H  . insulin aspart  0-9 Units Subcutaneous Q4H  . ipratropium-albuterol  3 mL Nebulization Q4H  . mouth rinse  15 mL Mouth Rinse BID  . methylPREDNISolone (SOLU-MEDROL) injection  40 mg Intravenous Q12H  . sodium chloride flush  3 mL Intravenous Q12H   sodium chloride, sodium chloride, acetaminophen, ondansetron (ZOFRAN) IV, sodium chloride flush  Assessment/ Plan:  71 y.o. male with diabetes mellitus type 2, hypertension, coronary artery disease, hyperlipidemia, obstructive sleep apnea on CPAP, osteoarthritis, nephrolithiasis, major depression disorder and COPD   1.  Acute renal failure/CKD stage III baseline egfr 38.  Has history of diabetic nephropathy and minimal change disease.  Renal function worse now likely secondary to cardiorenal syndrome. No urgent indication for dialysis at the moment however this may be a possibility if renal function continues to worsen. For now we will place the patient on a Lasix drip at 8 mg per hour. Continue to monitor renal function closely.  2. Acute respiratory failure. Secondary to volume overload. Continue the patient on BiPAP. We are also adding furosemide drip as above.  3.  Hypotension.  Discussed with pulmonary/critical care. We may need to add pressors so that the patient can tolerate Lasix drip.  4. Thanks for consultation.    LOS: 0 Torez Beauregard 3/14/20185:51 PM

## 2016-07-05 NOTE — H&P (Signed)
PULMONARY / CRITICAL CARE MEDICINE   Name: [name] [name] MRN: [id number] DOB: July 21, 1945    ADMISSION DATE:  07/05/2016 CONSULTATION DATE:  07/05/16  REFERRING MD:  Dr. Drenda Freeze  CHIEF COMPLAINT:  Shortness of Breath  HISTORY OF PRESENT ILLNESS:   [name] is a 71 y.o. Male with a PMH of Renal disorder, Kidney stones, HTN, DM, COPD, Obstructive sleep apnea, 3L Home O2, CAD, Hyperlipidemia, and CHF.  He presents to [facility] ER on 3/14 with c/o progressive shortness of breath.  Pt is currently lethargic and on Bipap, therefore the history is obtained from the pt's wife.  His wife reports that the SOB started around 2 weeks ago and has been progressively getting worse, causing him to increase his O2 requirements to 5L.  She also reports that he has had a weight gain of 10 pounds over the last week and increased swelling.  He has not smoked in 12 years, but he is exposed to second hand smoke as his wife currently smokes.  In the ED, CXR is concerning for CHF exacerbation superimposed on AECOPD.  PCCM is contacted to admit the pt to ICU for treatment of Acute Hypoxic Hypercapnic Respiratory Failure requiring Bipap.  PAST MEDICAL HISTORY :  He  has a past medical history of CHF (congestive heart failure) ([place]); COPD (chronic obstructive pulmonary disease) ([place]); Diabetes mellitus without complication ([place]); Hypertension; Kidney stones; and Renal disorder.  PAST SURGICAL HISTORY: He  has a past surgical history that includes Hernia repair and Lithotripsy.  No Known Allergies  No current facility-administered medications on file prior to encounter.    Current Outpatient Prescriptions on File Prior to Encounter  Medication Sig  . Ascorbic Acid (VITAMIN C) 1000 MG tablet Take 1,000 mg by mouth daily.  Marland Kitchen aspirin 81 MG chewable tablet Chew 81 mg by mouth daily.  . Cholecalciferol (VITAMIN D-1000 MAX ST) 1000 UNITS tablet Take 2,000 Units by mouth daily.   . Chromium Picolinate  500 MCG CAPS Take 200 mcg by mouth daily.  . citalopram (CELEXA) 20 MG tablet Take 20 mg by mouth daily.  . fenofibrate 160 MG tablet Take 160 mg by mouth daily.  . fexofenadine (ALLEGRA ALLERGY) 180 MG tablet Take 180 mg by mouth daily.  . fluticasone (FLONASE) 50 MCG/ACT nasal spray Place 2 sprays into both nostrils daily as needed for allergies.   . fluticasone-salmeterol (ADVAIR HFA) 115-21 MCG/ACT inhaler Inhale 1 puff into the lungs daily as needed (shortness of breath, wheezing).   . furosemide (LASIX) 20 MG tablet Take 20 mg by mouth daily.  Marland Kitchen gabapentin (NEURONTIN) 100 MG capsule Take 200 mg by mouth 2 (two) times daily.   . Glucosamine-Chondroit-Vit C-Mn (GLUCOSAMINE CHONDROITIN COMPLX) CAPS Take 1 tablet by mouth 2 (two) times daily.  Marland Kitchen losartan (COZAAR) 25 MG tablet Take 25 mg by mouth daily.  . Magnesium 500 MG CAPS Take 250 mg by mouth daily.   . methocarbamol (ROBAXIN) 750 MG tablet Take 750 mg by mouth 2 (two) times daily.   . metoprolol succinate (TOPROL-XL) 50 MG 24 hr tablet Take 50 mg by mouth daily.  . niacin 500 MG tablet Take 500 mg by mouth daily.  . pantoprazole (PROTONIX) 40 MG tablet Take 40 mg by mouth daily.  . tamsulosin (FLOMAX) 0.4 MG CAPS capsule Take 1 capsule (0.4 mg total) by mouth daily after supper.  . VENTOLIN HFA 108 (90 BASE) MCG/ACT inhaler Inhale 2 puffs into the lungs every 6 (six) hours as  needed for wheezing or shortness of breath.   . pravastatin (PRAVACHOL) 20 MG tablet Take 20 mg by mouth daily.  Marland Kitchen testosterone cypionate (DEPOTESTOSTERONE CYPIONATE) 200 MG/ML injection Inject 1 mL (200 mg total) into the muscle every 14 (fourteen) days. (Patient not taking: Reported on 07/05/2016)    FAMILY HISTORY:  His has no family status information on file.  +Copd  SOCIAL HISTORY: He  reports that he has quit smoking. He quit smokeless tobacco use about 12 years ago. He reports that he does not drink alcohol.  REVIEW OF SYSTEMS: per family  Positives in  BOLD Gen: Denies fever, chills, +weight change, fatigue, night sweats HEENT: Denies blurred vision, double vision, hearing loss, tinnitus, sinus congestion, rhinorrhea, sore throat, neck stiffness, dysphagia PULM: Denies +shortness of breath, cough, sputum production, hemoptysis, wheezing CV: Denies chest pain, edema, orthopnea, paroxysmal nocturnal dyspnea, palpitations GI: Denies abdominal pain, nausea, vomiting, diarrhea, hematochezia, melena, +constipation, change in bowel habits GU: Denies dysuria, hematuria, polyuria, oliguria, urethral discharge Endocrine: Denies hot or cold intolerance, polyuria, polyphagia or appetite change Derm: Denies rash, dry skin, scaling or peeling skin change Heme: Denies easy bruising, bleeding, bleeding gums Neuro: Denies headache, numbness, weakness, slurred speech, loss of memory or consciousness  VITAL SIGNS: BP (!) 96/54   Pulse 95   Temp 98.3 F (36.8 C) (Axillary)   Resp (!) 21   Ht 5\' 6"  (1.676 m)   Wt 237 lb 10.5 oz (107.8 kg)   SpO2 92%   BMI 38.36 kg/m   HEMODYNAMICS:    VENTILATOR SETTINGS: FiO2 (%):  [60 %] 60 %  INTAKE / OUTPUT: No intake/output data recorded.  PHYSICAL EXAMINATION: General:  Acutely ill, obese caucasian male, in moderate respiratory distress requiring Bipap, laying in bed Neuro:  Lethargic, arouses to voice, Follows commands, PERRL HEENT:  Atraumatic, Normocephalic, No JVD Cardiovascular:  RRR, s1s2, No R/M/G Lungs:  Increased work of breathing and accessory muscle use present, diminished throughout with expiratory wheezing to bilateral lower lobes and crackles to right lower lobe, symmetrical expansion Abdomen:  Obese, Slightly distended, Soft, Non-tender, BS positive x4 Musculoskeletal:  Normal bulk and tone, 1+ generalized edema, 2+ edema to bilateral lower extremities Skin:  Dry, intact. No rashes, lesions, or ulcerations.  LABS:  BMET  Recent Labs Lab 07/05/16 0825  NA 140  K 4.9  CL 103  CO2  28  BUN 38*  CREATININE 2.28*  GLUCOSE 198*    Electrolytes  Recent Labs Lab 07/05/16 0825  CALCIUM 9.2    CBC  Recent Labs Lab 07/05/16 0825  WBC 9.8  HGB 15.5  HCT 45.7  PLT 218    Coag's No results for input(s): APTT, INR in the last 168 hours.  Sepsis Markers No results for input(s): LATICACIDVEN, PROCALCITON, O2SATVEN in the last 168 hours.  ABG  Recent Labs Lab 07/05/16 0824  PHART 7.35  PCO2ART 49*  PO2ART 78*    Liver Enzymes  Recent Labs Lab 07/05/16 0825  AST 29  ALT 18  ALKPHOS 38  BILITOT 1.0  ALBUMIN 3.9    Cardiac Enzymes  Recent Labs Lab 07/05/16 0825  TROPONINI 0.10*    Glucose  Recent Labs Lab 07/05/16 1026 07/05/16 1109  GLUCAP 167* 158*    Imaging Dg Chest Port 1 View  Result Date: 07/05/2016 CLINICAL DATA:  Onset of shortness of breath last night made worse today, on CPAP, oxygen saturations 70% on 15 L. Increased work of breathing. History of COPD, CHF, former smoker. EXAM:  PORTABLE CHEST 1 VIEW COMPARISON:  Chest x-ray of February 15, 2016 FINDINGS: The lungs are well-expanded. The interstitial markings are increased. The pulmonary vascularity is engorged. The cardiac silhouette is enlarged. There small bilateral pleural effusions. There is no pneumothorax. IMPRESSION: CHF with interstitial edema superimposed upon COPD. No discrete pneumonia. Electronically Signed   By: David  Martinique M.D.   On: 07/05/2016 08:45     STUDIES:  None  CULTURES: 3/14 Blood cultures x2>> 3/14 Influenza PCR>>  ANTIBIOTICS: 3/14 Azithromycin>> 3/14 Ceftriaxone>>  SIGNIFICANT EVENTS: 3/14 Admission to [facility] ICU for Acute Hypoxic Hypercapnic Respiratory Failure  LINES/TUBES: None  DISCUSSION: [name] is a 71 y.o. Male who presented to [facility] ED on 3/14 with c/o of progressive shortness of breath.  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  He is admitted to [facility] ICU for treatment of Acute  Hypoxic Hypercapnic Respiratory Failure secondary to to CHF exacerbation, AECOPD, and Acute Kidney Injury-progression/and worsening of  of cardio-renal syndrome  ASSESSMENT / PLAN:  PULMONARY A: Acute Hypoxic Hypercapnic Respiratory Failure secondary to CHF exacerbation & AECOPD Hx: COPD, 3L Home O2  P:   Maintain O2 saturations >88% Supplemental O2 to maintain O2 saturations Continuous Bipap Scheduled Duoneb & Budesonide nebs IV Solumedrol Abx as above CXR in AM 3/15  **He is high risk for intubation  CARDIOVASCULAR A:  CHF exacerbation Elevated troponin likely secondary to demand ischemia Hx: CHF, HTN, CAD  P:  Continuous Telemetry monitoring Obtain Echocardiogram Trend Troponin's Discontinue Nitroglycerin gtt Hold home Losartan, Metoprolol at this time due to marginal BP Will await Nephrology recommendations on Diuresis due to AKI  RENAL A:   Acute Kidney injury Hx: Kidney stones, Renal disorder  P:   Monitor I&O Follow BMP's closely  Consult Nephrology, appreciate input Will await Nephrology recommendations on diuresis with AKI  GASTROINTESTINAL A:   Constipation  P:   Will Keep NPO for now Pepcid for SUP  Will start bowel regimen for constipation once more stable from respiratory standpoint  HEMATOLOGIC A:   No acute issues  P:  Monitor for S/Sx of bleeding Heparin for VTE prophylaxis Transfuse for Hbg <7  INFECTIOUS A:   AECOPD  P:   Follow CBC's Trend PCT's Monitor Fever curve Follow Blood cultures Abx as above for AECOPD Check Influenza and MRSA PCR's  ENDOCRINE A:   Hyperglycemia Hx: DM    P:   CBG q4h SSI q4h Follow Hypo/Hyperglycemia protocol Will hold home Glimepiride at this time  NEUROLOGIC A:   No acute issues  P:   Provide supportive care Avoid sedating medications Lights on during the day   FAMILY  - Updates: Wife and daughter at bedside.  Discussed critical nature of patient and that he is high risk for  intubation.  They are in agreement with FULL CODE and aggressive measures at this time.   - Inter-disciplinary family meet or Palliative Care meeting due by:  07/12/16   07/05/2016, 12:02 PM   Critical Care Time devoted to patient care services described in this note is  minutes.   Overall, patient is critically ill, prognosis is guarded.  Patient with Multiorgan failure and at high risk for cardiac arrest and death.    Corrin Parker, M.D.  [facility]  Medical Director [place] Director [facility] Cardio-Pulmonary Department

## 2016-07-05 NOTE — ED Notes (Signed)
Breathing has improved. Pt feels like can breath better. Mildly labored but much improved. Remains on bipap.

## 2016-07-05 NOTE — ED Provider Notes (Addendum)
[facility] Provider Note   CSN: [id number] Arrival date & time: 07/05/16  5885     History   Chief Complaint Chief Complaint  Patient presents with  . Shortness of Breath    HPI [name] [name] is a 71 y.o. male history diabetes, hypertension, COPD, heart failure on 3 L nasal cannula at baseline, here presenting with acute onset of shortness of breath. Shortness of breath since yesterday. He states that at baseline, he sleeps sitting up and he has trouble sleeping even sitting up yesterday. He had to turn up his oxygen to 5 L NS this morning. He states that his legs and abdomen ave been more swollen. He is unable to speak much due to shortness of breath so unable to give much history. Patient's family called EMS today and he was noted to be 70% on 15 L [place]. Patient was subsequently put on CPAP and given 2 albuterol nebs and 1 DuoNeb and is feeling slightly better.   The history is provided by the patient.   Level V caveat- shortness of breath, condition of patient    Past Medical History:  Diagnosis Date  . CHF (congestive heart failure) ([place])   . COPD (chronic obstructive pulmonary disease) ([place])   . Diabetes mellitus without complication ([place])   . Hypertension   . Kidney stones   . Renal disorder     There are no active problems to display for this patient.   Past Surgical History:  Procedure Laterality Date  . HERNIA REPAIR    . LITHOTRIPSY         Home Medications    Prior to Admission medications   Medication Sig Start Date End Date Taking? Authorizing Provider  Ascorbic Acid (VITAMIN C) 1000 MG tablet Take 1,000 mg by mouth daily.   Yes Historical Provider, MD  B Complex-C (SUPER B COMPLEX PO) Take 1 tablet by mouth daily.   Yes Historical Provider, MD  Cholecalciferol (VITAMIN D-1000 MAX ST) 1000 UNITS tablet Take 2,000 Units by mouth daily.    Yes Historical Provider, MD  Chromium Picolinate 500 MCG CAPS Take 200 mcg by mouth daily.    Yes Historical Provider, MD  citalopram (CELEXA) 20 MG tablet Take 20 mg by mouth daily. 07/26/13  Yes Historical Provider, MD  fenofibrate 160 MG tablet Take 160 mg by mouth daily. 09/14/14  Yes Historical Provider, MD  furosemide (LASIX) 20 MG tablet Take 20 mg by mouth daily. 08/19/14  Yes Historical Provider, MD  gabapentin (NEURONTIN) 100 MG capsule Take 200 mg by mouth 2 (two) times daily.  12/01/14  Yes Historical Provider, MD  glimepiride (AMARYL) 2 MG tablet Take 2 mg by mouth 2 (two) times daily.   Yes Historical Provider, MD  losartan (COZAAR) 25 MG tablet Take 25 mg by mouth daily. 12/08/14  Yes Historical Provider, MD  methocarbamol (ROBAXIN) 750 MG tablet Take 750 mg by mouth 2 (two) times daily.  08/12/13  Yes Historical Provider, MD  metoprolol succinate (TOPROL-XL) 50 MG 24 hr tablet Take 50 mg by mouth daily. 11/03/14  Yes Historical Provider, MD  niacin 500 MG tablet Take 500 mg by mouth daily.   Yes Historical Provider, MD  pantoprazole (PROTONIX) 40 MG tablet Take 40 mg by mouth daily.   Yes Historical Provider, MD  Potassium 99 MG TABS Take 1 tablet by mouth daily.   Yes Historical Provider, MD  spironolactone (ALDACTONE) 25 MG tablet Take 25 mg by mouth daily.   Yes Historical  Provider, MD  tamsulosin (FLOMAX) 0.4 MG CAPS capsule Take 1 capsule (0.4 mg total) by mouth daily after supper. 05/16/16  Yes Festus Aloe, MD  VENTOLIN HFA 108 (90 BASE) MCG/ACT inhaler Inhale 2 puffs into the lungs every 6 (six) hours as needed for wheezing or shortness of breath.  11/04/14  Yes Historical Provider, MD  aspirin 81 MG chewable tablet Chew 81 mg by mouth daily.    Historical Provider, MD  fexofenadine (ALLEGRA ALLERGY) 180 MG tablet Take 180 mg by mouth daily.    Historical Provider, MD  fluticasone (FLONASE) 50 MCG/ACT nasal spray Place 2 sprays into both nostrils daily as needed for allergies.  10/23/14   Historical Provider, MD  fluticasone-salmeterol (ADVAIR HFA) 115-21 MCG/ACT inhaler  Inhale 1 puff into the lungs daily as needed (shortness of breath, wheezing).     Historical Provider, MD  Glucosamine-Chondroit-Vit C-Mn (GLUCOSAMINE CHONDROITIN COMPLX) CAPS Take 1 tablet by mouth 2 (two) times daily.    Historical Provider, MD  Magnesium 500 MG CAPS Take 500 mg by mouth daily.    Historical Provider, MD  pravastatin (PRAVACHOL) 20 MG tablet Take 20 mg by mouth daily. 12/07/14   Historical Provider, MD  testosterone cypionate (DEPOTESTOSTERONE CYPIONATE) 200 MG/ML injection Inject 1 mL (200 mg total) into the muscle every 14 (fourteen) days. Patient not taking: Reported on 07/05/2016 06/06/16   Festus Aloe, MD    Family History History reviewed. No pertinent family history.  Social History Social History  Substance Use Topics  . Smoking status: Former Research scientist (life sciences)  . Smokeless tobacco: Former Systems developer    Quit date: 05/16/2004  . Alcohol use No     Allergies   Patient has no known allergies.   Review of Systems Review of Systems  Respiratory: Positive for shortness of breath.   All other systems reviewed and are negative.    Physical Exam Updated Vital Signs BP 125/62   Pulse 100   Resp 20   Wt 230 lb (104.3 kg)   SpO2 91%   BMI 37.12 kg/m   Physical Exam  Constitutional: He is oriented to person, place, and time.  Tachypneic, increased work of breathing   HENT:  Head: Normocephalic.  CPAP on patient   Eyes: EOM are normal. Pupils are equal, round, and reactive to light.  Neck: Normal range of motion. Neck supple.  Cardiovascular: Normal rate, regular rhythm and normal heart sounds.   Pulmonary/Chest:  Tachypneic, moderate distress. + diminished bilateral bases. + wheezing upper lobes   Abdominal: Soft. Bowel sounds are normal. He exhibits no distension. There is no tenderness.  Musculoskeletal:  1+ edema bilateral legs. No calf tenderness   Neurological: He is alert and oriented to person, place, and time.  Skin: Skin is warm.  Psychiatric: He has a  normal mood and affect.  Nursing note and vitals reviewed.    ED Treatments / Results  Labs (all labs ordered are listed, but only abnormal results are displayed) Labs Reviewed  CBC WITH DIFFERENTIAL/PLATELET - Abnormal; Notable for the following:       Result Value   MCV 100.1 (*)    RDW 15.7 (*)    Neutro Abs 6.7 (*)    All other components within normal limits  COMPREHENSIVE METABOLIC PANEL - Abnormal; Notable for the following:    Glucose, Bld 198 (*)    BUN 38 (*)    Creatinine, Ser 2.28 (*)    GFR calc non Af Amer 27 (*)    GFR  calc Af Amer 31 (*)    All other components within normal limits  TROPONIN I - Abnormal; Notable for the following:    Troponin I 0.10 (*)    All other components within normal limits  BRAIN NATRIURETIC PEPTIDE - Abnormal; Notable for the following:    B Natriuretic Peptide 445.0 (*)    All other components within normal limits  BLOOD GAS, ARTERIAL - Abnormal; Notable for the following:    pCO2 arterial 49 (*)    pO2, Arterial 78 (*)    All other components within normal limits    EKG  EKG Interpretation None      ED ECG REPORT I, Wandra Arthurs, the attending physician, personally viewed and interpreted this ECG.   Date: 07/05/2016  EKG Time: 8:23 am  Rate: 105  Rhythm: sinus tachycardia  Axis: normal  Intervals:none  ST&T Change: LVH with repol     Radiology Dg Chest Port 1 View  Result Date: 07/05/2016 CLINICAL DATA:  Onset of shortness of breath last night made worse today, on CPAP, oxygen saturations 70% on 15 L. Increased work of breathing. History of COPD, CHF, former smoker. EXAM: PORTABLE CHEST 1 VIEW COMPARISON:  Chest x-ray of February 15, 2016 FINDINGS: The lungs are well-expanded. The interstitial markings are increased. The pulmonary vascularity is engorged. The cardiac silhouette is enlarged. There small bilateral pleural effusions. There is no pneumothorax. IMPRESSION: CHF with interstitial edema superimposed upon COPD.  No discrete pneumonia. Electronically Signed   By: Ciella Obi  [place] M.D.   On: 07/05/2016 08:45    Procedures Procedures (including critical care time)   CRITICAL CARE Performed by: Wandra Arthurs   Total critical care time: 30 minutes  Critical care time was exclusive of separately billable procedures and treating other patients.  Critical care was necessary to treat or prevent imminent or life-threatening deterioration.  Critical care was time spent personally by me on the following activities: development of treatment plan with patient and/or surrogate as well as nursing, discussions with consultants, evaluation of patient's response to treatment, examination of patient, obtaining history from patient or surrogate, ordering and performing treatments and interventions, ordering and review of laboratory studies, ordering and review of radiographic studies, pulse oximetry and re-evaluation of patient's condition.   Medications Ordered in ED Medications  magnesium sulfate IVPB 2 g 50 mL (2 g Intravenous New Bag/Given 07/05/16 7017)  nitroGLYCERIN 50 mg in dextrose 5 % 250 mL (0.2 mg/mL) infusion (10 mcg/min Intravenous New Bag/Given 07/05/16 0837)  methylPREDNISolone sodium succinate (SOLU-MEDROL) 125 mg/2 mL injection 125 mg (125 mg Intravenous Given 07/05/16 0820)  furosemide (LASIX) injection 40 mg (40 mg Intravenous Given 07/05/16 0835)     Initial Impression / Assessment and Plan / ED Course  I have reviewed the triage vital signs and the nursing notes.  Pertinent labs & imaging results that were available during my care of the patient were reviewed by me and considered in my medical decision making (see chart for details).     Amitai Delaughter is a 71 y.o. male here with SOB, wheezing, respiratory distress. Likely COPD, possible pulmonary edema. Consider pneumonia vs flu as well. Will continue albuterol, add solumedrol, magnesium, IV lasix, nitro drip. Will get labs, BNP, CXR  and ABG. Will monitor closely.   9:10 AM Labs showed acute renal failure with Cr 2.2, baseline 1.6. Trop 0.1. CXR showed pulmonary edema. Elevated trop likely demand ischemia from CHF. ABG on bipap showed pH 7.3, PCO2 50.  Will admit to ICU for COPD and CHF exacerbation and acute respiratory distress with hypoxia.   9:24 AM Talked to hospitalist, who request ICU admission since he is on bipap. BP dropped with nitro drip so I stopped nitro drip. Given lasix for pulmonary edema   Final Clinical Impressions(s) / ED Diagnoses   Final diagnoses:  None    New Prescriptions New Prescriptions   No medications on file     Drenda Freeze, MD 07/05/16 2202    Drenda Freeze, MD 07/05/16 [phone]

## 2016-07-06 ENCOUNTER — Inpatient Hospital Stay: Payer: PPO

## 2016-07-06 ENCOUNTER — Inpatient Hospital Stay
Admit: 2016-07-06 | Discharge: 2016-07-06 | Disposition: A | Discharge status: Home / Self Care | Payer: PPO | Attending: Pulmonary Disease | Admitting: Pulmonary Disease

## 2016-07-06 DIAGNOSIS — J449 Chronic obstructive pulmonary disease, unspecified: Secondary | ICD-10-CM

## 2016-07-06 DIAGNOSIS — N179 Acute kidney failure, unspecified: Secondary | ICD-10-CM

## 2016-07-06 DIAGNOSIS — J441 Chronic obstructive pulmonary disease with (acute) exacerbation: Secondary | ICD-10-CM

## 2016-07-06 LAB — RENAL FUNCTION PANEL
ALBUMIN: 3.6 g/dL (ref 3.5–5.0)
ALBUMIN: 3.8 g/dL (ref 3.5–5.0)
Albumin: 3.7 g/dL (ref 3.5–5.0)
Anion gap: 5 (ref 5–15)
Anion gap: 9 (ref 5–15)
Anion gap: 8 (ref 5–15)
BUN: 15 mg/dL (ref 6–20)
BUN: 57 mg/dL — AB (ref 6–20)
BUN: 51 mg/dL — AB (ref 6–20)
CALCIUM: 9.5 mg/dL (ref 8.9–10.3)
CHLORIDE: 100 mmol/L — AB (ref 101–111)
CO2: 28 mmol/L (ref 22–32)
CO2: 28 mmol/L (ref 22–32)
CO2: 28 mmol/L (ref 22–32)
CREATININE: 3.22 mg/dL — AB (ref 0.61–1.24)
Calcium: 8.6 mg/dL — ABNORMAL LOW (ref 8.9–10.3)
Calcium: 8.7 mg/dL — ABNORMAL LOW (ref 8.9–10.3)
Chloride: 107 mmol/L (ref 101–111)
Chloride: 100 mmol/L — ABNORMAL LOW (ref 101–111)
Creatinine, Ser: 3.03 mg/dL — ABNORMAL HIGH (ref 0.61–1.24)
Creatinine, Ser: 2.55 mg/dL — ABNORMAL HIGH (ref 0.61–1.24)
GFR calc Af Amer: 21 mL/min — ABNORMAL LOW (ref >60)
GFR calc Af Amer: 22 mL/min — ABNORMAL LOW (ref >60)
GFR calc non Af Amer: 18 mL/min — ABNORMAL LOW (ref >60)
GFR, EST AFRICAN AMERICAN: 27 mL/min — AB (ref >60)
GFR, EST NON AFRICAN AMERICAN: 19 mL/min — AB (ref >60)
GFR, EST NON AFRICAN AMERICAN: 24 mL/min — AB (ref >60)
GLUCOSE: 178 mg/dL — AB (ref 65–99)
Glucose, Bld: 96 mg/dL (ref 65–99)
Glucose, Bld: 187 mg/dL — ABNORMAL HIGH (ref 65–99)
PHOSPHORUS: 4.4 mg/dL (ref 2.5–4.6)
POTASSIUM: 4.6 mmol/L (ref 3.5–5.1)
POTASSIUM: 4.5 mmol/L (ref 3.5–5.1)
Phosphorus: 2.7 mg/dL (ref 2.5–4.6)
Phosphorus: 5 mg/dL — ABNORMAL HIGH (ref 2.5–4.6)
Potassium: 3.8 mmol/L (ref 3.5–5.1)
SODIUM: 140 mmol/L (ref 135–145)
Sodium: 137 mmol/L (ref 135–145)
Sodium: 136 mmol/L (ref 135–145)

## 2016-07-06 LAB — CBC
HCT: 41.7 % (ref 40.0–52.0)
Hemoglobin: 14.3 g/dL (ref 13.0–18.0)
MCH: 34.1 pg — AB (ref 26.0–34.0)
MCHC: 34.2 g/dL (ref 32.0–36.0)
MCV: 99.6 fL (ref 80.0–100.0)
PLATELETS: 203 10*3/uL (ref 150–440)
RBC: 4.19 MIL/uL — ABNORMAL LOW (ref 4.40–5.90)
RDW: 14.8 % — AB (ref 11.5–14.5)
WBC: 8.1 10*3/uL (ref 3.8–10.6)

## 2016-07-06 LAB — BASIC METABOLIC PANEL
ANION GAP: 11 (ref 5–15)
BUN: 53 mg/dL — ABNORMAL HIGH (ref 6–20)
CALCIUM: 9.1 mg/dL (ref 8.9–10.3)
CO2: 27 mmol/L (ref 22–32)
Chloride: 100 mmol/L — ABNORMAL LOW (ref 101–111)
Creatinine, Ser: 3.35 mg/dL — ABNORMAL HIGH (ref 0.61–1.24)
GFR, EST AFRICAN AMERICAN: 20 mL/min — AB (ref >60)
GFR, EST NON AFRICAN AMERICAN: 17 mL/min — AB (ref >60)
GLUCOSE: 172 mg/dL — AB (ref 65–99)
POTASSIUM: 4.9 mmol/L (ref 3.5–5.1)
SODIUM: 138 mmol/L (ref 135–145)

## 2016-07-06 LAB — GLUCOSE, CAPILLARY
GLUCOSE-CAPILLARY: 156 mg/dL — AB (ref 65–99)
GLUCOSE-CAPILLARY: 155 mg/dL — AB (ref 65–99)
Glucose-Capillary: 135 mg/dL — ABNORMAL HIGH (ref 65–99)
Glucose-Capillary: 162 mg/dL — ABNORMAL HIGH (ref 65–99)
Glucose-Capillary: 171 mg/dL — ABNORMAL HIGH (ref 65–99)
Glucose-Capillary: 201 mg/dL — ABNORMAL HIGH (ref 65–99)

## 2016-07-06 LAB — BLOOD GAS, ARTERIAL
Acid-Base Excess: 0.2 mmol/L (ref 0.0–2.0)
BICARBONATE: 26.5 mmol/L (ref 20.0–28.0)
Delivery systems: POSITIVE
Expiratory PAP: 6
FIO2: 0.45
Inspiratory PAP: 14
MECHANICAL RATE: 8
O2 Saturation: 96.4 %
PATIENT TEMPERATURE: 37
PCO2 ART: 48 mmHg (ref 32.0–48.0)
PH ART: 7.35 (ref 7.350–7.450)
pO2, Arterial: 89 mmHg (ref 83.0–108.0)

## 2016-07-06 LAB — TROPONIN I: TROPONIN I: 0.3 ng/mL — AB (ref <0.03)

## 2016-07-06 LAB — PROCALCITONIN: Procalcitonin: 0.1 ng/mL

## 2016-07-06 MED ORDER — ASPIRIN EC 81 MG PO TBEC
81.0000 mg | DELAYED_RELEASE_TABLET | Freq: Every day | ORAL | Status: DC
  Administered 2016-07-06 – 2016-07-10 (×5): 81 mg via ORAL
  Filled 2016-07-06 (×6): qty 1

## 2016-07-06 MED ORDER — GLUCERNA SHAKE PO LIQD
237.0000 mL | Freq: Three times a day (TID) | ORAL | Status: DC
  Administered 2016-07-06 – 2016-07-10 (×6): 237 mL via ORAL

## 2016-07-06 MED ORDER — SODIUM CHLORIDE 0.9% FLUSH
10.0000 mL | Freq: Two times a day (BID) | INTRAVENOUS | Status: DC
  Administered 2016-07-06 – 2016-07-07 (×2): 20 mL
  Administered 2016-07-07 – 2016-07-09 (×3): 10 mL
  Administered 2016-07-09: 30 mL
  Administered 2016-07-10 (×2): 10 mL

## 2016-07-06 MED ORDER — SODIUM CHLORIDE 0.9% FLUSH: 10.0000 mL | INTRAVENOUS | Status: DC | PRN

## 2016-07-06 MED ORDER — CITALOPRAM HYDROBROMIDE 20 MG PO TABS
20.0000 mg | ORAL_TABLET | Freq: Every day | ORAL | Status: DC
  Administered 2016-07-06 – 2016-07-10 (×5): 20 mg via ORAL
  Filled 2016-07-06 (×5): qty 1

## 2016-07-06 MED ORDER — LIDOCAINE HCL 1 % IJ SOLN
5.0000 mL | Freq: Once | INTRAMUSCULAR | Status: AC
  Administered 2016-07-06: 5 mL via INTRADERMAL
  Filled 2016-07-06: qty 5

## 2016-07-06 MED ORDER — MORPHINE SULFATE (PF) 4 MG/ML IV SOLN
2.0000 mg | Freq: Once | INTRAVENOUS | Status: AC
  Administered 2016-07-06: 2 mg via INTRAVENOUS

## 2016-07-06 MED ORDER — ENSURE ENLIVE PO LIQD
237.0000 mL | Freq: Two times a day (BID) | ORAL | Status: DC
  Administered 2016-07-07 – 2016-07-08 (×3): 237 mL via ORAL

## 2016-07-06 MED ORDER — FAMOTIDINE IN NACL 20-0.9 MG/50ML-% IV SOLN: 20.0000 mg | INTRAVENOUS | Status: DC

## 2016-07-06 MED ORDER — AZITHROMYCIN 500 MG PO TABS
500.0000 mg | ORAL_TABLET | ORAL | Status: AC
  Administered 2016-07-06 – 2016-07-09 (×4): 500 mg via ORAL
  Filled 2016-07-06 (×4): qty 1

## 2016-07-06 MED ORDER — GABAPENTIN 100 MG PO CAPS
200.0000 mg | ORAL_CAPSULE | Freq: Two times a day (BID) | ORAL | Status: DC
  Administered 2016-07-06 – 2016-07-10 (×10): 200 mg via ORAL
  Filled 2016-07-06 (×10): qty 2

## 2016-07-06 MED ORDER — FAMOTIDINE 20 MG PO TABS
20.0000 mg | ORAL_TABLET | Freq: Every day | ORAL | Status: DC
  Administered 2016-07-06 – 2016-07-10 (×5): 20 mg via ORAL
  Filled 2016-07-06 (×5): qty 1

## 2016-07-06 MED ORDER — PUREFLOW DIALYSIS SOLUTION
INTRAVENOUS | Status: DC
  Administered 2016-07-06 – 2016-07-08 (×6): via INTRAVENOUS_CENTRAL
  Administered 2016-07-09: 3 via INTRAVENOUS_CENTRAL

## 2016-07-06 MED ORDER — LIDOCAINE HCL (PF) 2 % IJ SOLN
5.0000 mL | Freq: Once | INTRAMUSCULAR | Status: AC
  Administered 2016-07-06: 5 mL via INTRADERMAL

## 2016-07-06 MED ORDER — HEPARIN SODIUM (PORCINE) 1000 UNIT/ML DIALYSIS
1000.0000 [IU] | INTRAMUSCULAR | Status: DC | PRN
  Administered 2016-07-06 – 2016-07-08 (×4): 1700 [IU] via INTRAVENOUS_CENTRAL
  Administered 2016-07-08: 1000 [IU] via INTRAVENOUS_CENTRAL
  Administered 2016-07-08: 1700 [IU] via INTRAVENOUS_CENTRAL
  Filled 2016-07-06 (×2): qty 10

## 2016-07-06 MED ORDER — MORPHINE SULFATE (PF) 4 MG/ML IV SOLN
2.0000 mg | INTRAVENOUS | Status: DC | PRN
  Administered 2016-07-06 – 2016-07-07 (×4): 2 mg via INTRAVENOUS
  Filled 2016-07-06 (×5): qty 1

## 2016-07-06 NOTE — Progress Notes (Signed)
Patient tried off bipap per patient request around 20:30 so he could drink. However, after he took 1 sip (less than 1 minute off) he stated he felt like he needed to go back on and that his breathing was making him anxious. After RN placed him back on bipap he stated he felt better. Team will continue to monitor.

## 2016-07-06 NOTE — Progress Notes (Signed)
[facility]  ROUNDING NOTE   Subjective:  Renal function appears to be worse this a.m. BUN up to 53 with a creatinine up to 3.3. Urine output yesterday was 1000 cc. However given rising in renal function we suggested to the patient that renal placement therapy will likely be the best option at this time. Patient consented to proceeding with renal replacement therapy.   Objective:  Vital signs in last 24 hours:  Temp:  [97.5 F (36.4 C)-98.5 F (36.9 C)] 98.5 F (36.9 C) (03/15 0800) Pulse Rate:  [87-111] 108 (03/15 0800) Resp:  [12-31] 26 (03/15 0800) BP: (82-133)/(50-82) 122/55 (03/15 0800) SpO2:  [90 %-99 %] 97 % (03/15 0800) FiO2 (%):  [35 %-60 %] 45 % (03/15 0700) Weight:  [107.8 kg (237 lb 10.5 oz)-108.1 kg (238 lb 5.1 oz)] 108.1 kg (238 lb 5.1 oz) (03/15 0421)  Weight change:  Filed Weights   07/05/16 0826 07/05/16 1032 07/06/16 0421  Weight: 104.3 kg (230 lb) 107.8 kg (237 lb 10.5 oz) 108.1 kg (238 lb 5.1 oz)    Intake/Output: I/O last 3 completed shifts: In: 408 [P.O.:240; I.V.:78; IV Piggyback:400] Out: 1000 [Urine:1000]   Intake/Output this shift:  No intake/output data recorded.  Physical Exam: General: Critically ill appearing   Head: Normocephalic, atraumatic. Moist oral mucosal membranes  Eyes: Anicteric  Neck: Supple, trachea midline  Lungs:  Rales bilateral, currently on BiPAP   Heart: S1S2 no rubs  Abdomen:  Soft, nontender, bowel sounds present  Extremities: 2+ peripheral edema.  Neurologic: Nonfocal, moving all four extremities  Skin: No lesions       Basic Metabolic Panel:  Recent Labs Lab 07/05/16 0825 07/05/16 1813 07/06/16 0236  NA 140  --  138  K 4.9 4.8 4.9  CL 103  --  100*  CO2 28  --  27  GLUCOSE 198*  --  172*  BUN 38*  --  53*  CREATININE 2.28*  --  3.35*  CALCIUM 9.2  --  9.1    Liver Function Tests:  Recent Labs Lab 07/05/16 0825  AST 29  ALT 18  ALKPHOS 38  BILITOT 1.0  PROT 7.9  ALBUMIN 3.9    No results for input(s): LIPASE, AMYLASE in the last 168 hours. No results for input(s): AMMONIA in the last 168 hours.  CBC:  Recent Labs Lab 07/05/16 0825 07/06/16 0236  WBC 9.8 8.1  NEUTROABS 6.7*  --   HGB 15.5 14.3  HCT 45.7 41.7  MCV 100.1* 99.6  PLT 218 203    Cardiac Enzymes:  Recent Labs Lab 07/05/16 0825 07/05/16 1533 07/05/16 2112 07/06/16 0236  TROPONINI 0.10* 0.11* 0.12* 0.30*    BNP: Invalid input(s): POCBNP  CBG:  Recent Labs Lab 07/05/16 1623 07/05/16 1935 07/06/16 0015 07/06/16 0342 07/06/16 0733  GLUCAP 196* 160* 135* 162* 156*    Microbiology: Results for orders placed or performed during the hospital encounter of 07/05/16  MRSA PCR Screening     Status: None   Collection Time: 07/05/16 10:29 AM  Result Value Ref Range Status   MRSA by PCR NEGATIVE NEGATIVE Final    Comment:        The GeneXpert MRSA Assay (FDA approved for NASAL specimens only), is one component of a comprehensive MRSA colonization surveillance program. It is not intended to diagnose MRSA infection nor to guide or monitor treatment for MRSA infections.     Coagulation Studies: No results for input(s): LABPROT, INR in the last 72 hours.  Urinalysis: No results for input(s): COLORURINE, LABSPEC, PHURINE, GLUCOSEU, HGBUR, BILIRUBINUR, KETONESUR, PROTEINUR, UROBILINOGEN, NITRITE, LEUKOCYTESUR in the last 72 hours.  Invalid input(s): APPERANCEUR    Imaging: Dg Chest Port 1 View  Result Date: 07/06/2016 CLINICAL DATA:  Interval change, CHF. EXAM: PORTABLE CHEST 1 VIEW COMPARISON:  Chest radiograph July 05, 2016 FINDINGS: Cardiac silhouette is mildly enlarged and unchanged. Calcified aortic knob. Similar interstitial prominence and increased lung volumes. Trace residual pleural effusions, improved. No pneumothorax. Soft tissue planes and included osseous structures are unchanged. IMPRESSION: Cardiomegaly and resolving interstitial edema on a background of  COPD.Trace residual pleural effusions, improved. Electronically Signed   By: Elon Alas M.D.   On: 07/06/2016 05:24   Dg Chest Port 1 View  Result Date: 07/05/2016 CLINICAL DATA:  Onset of shortness of breath last night made worse today, on CPAP, oxygen saturations 70% on 15 L. Increased work of breathing. History of COPD, CHF, former smoker. EXAM: PORTABLE CHEST 1 VIEW COMPARISON:  Chest x-ray of February 15, 2016 FINDINGS: The lungs are well-expanded. The interstitial markings are increased. The pulmonary vascularity is engorged. The cardiac silhouette is enlarged. There small bilateral pleural effusions. There is no pneumothorax. IMPRESSION: CHF with interstitial edema superimposed upon COPD. No discrete pneumonia. Electronically Signed   By: David  Martinique M.D.   On: 07/05/2016 08:45     Medications:   . furosemide (LASIX) infusion 8 mg/hr (07/05/16 1929)   . azithromycin  500 mg Oral Q24H  . budesonide (PULMICORT) nebulizer solution  0.5 mg Nebulization BID  . famotidine  20 mg Oral QHS  . heparin  5,000 Units Subcutaneous Q8H  . insulin aspart  0-9 Units Subcutaneous Q4H  . ipratropium-albuterol  3 mL Nebulization Q4H  . mouth rinse  15 mL Mouth Rinse BID  . methylPREDNISolone (SOLU-MEDROL) injection  40 mg Intravenous Q12H  . sodium chloride flush  3 mL Intravenous Q12H   sodium chloride, sodium chloride, acetaminophen, ondansetron (ZOFRAN) IV, sodium chloride flush  Assessment/ Plan:  71 y.o. male with diabetes mellitus type 2, hypertension, coronary artery disease, hyperlipidemia, obstructive sleep apnea on CPAP, osteoarthritis, nephrolithiasis, major depression disorder and COPD   1.  Acute renal failure/CKD stage III baseline egfr 38.  Has history of diabetic nephropathy and minimal change disease.  Renal function worse now likely secondary to cardiorenal syndrome.  - Renal function appears to be worsening at the moment. Therefore we will discontinue furosemide drip and  opt for continuous renal placement therapy. I've discussed the case with pulmonary/critical care and they will place a temporary dialysis catheter and we will proceed with continuous renal replacement therapy with an ultrafiltration target of 50 cc per hour.  2. Acute respiratory failure. Secondary to volume overload.  -  Patient in need of further ultrafiltration. Therefore we will start the patient on continuous renal replacement therapy with ultrafiltration target of 50 cc per hour.  3.  Hypotension.  Blood pressure trend appears to have improved. Continue to monitor closely while on continuous renal placement therapy.     LOS: 1 Daeshawn Redmann 3/15/20189:43 AM

## 2016-07-06 NOTE — Progress Notes (Signed)
CRRT filter clotted. Lines flushed and heparin locked per orders while new cartridge obtained and primed.

## 2016-07-06 NOTE — Telephone Encounter (Signed)
No answer. No vm setup.  

## 2016-07-06 NOTE — Progress Notes (Signed)
Patient started on CRRT at 1700. Blood flow rate set at 270 due to access pressure levels, will titrate up as tolerated. vss so far on 50 UF, no complaints. Patient still on bipap. Family and patient updated on plan of care. [name] [name]

## 2016-07-06 NOTE — Procedures (Signed)
Central Venous Dailysis Catheter Placement: Indication: Hemo Dialysis/CRRT   Consent:verbal/written  Risks and benefits explained in detail including risk of infection, bleeding, respiratory failure and death..   Hand washing performed prior to starting the procedure.   Procedure: An active timeout was performed and correct patient, name, & ID confirmed.  After explaining risk and benefits, patient was positioned correctly for central venous access. Patient was prepped using strict sterile technique including chlorohexadine preps, sterile drape, sterile gown and sterile gloves.  The area was prepped, draped and anesthetized in the usual sterile manner. Patient comfort was obtained.  A triple lumen catheter was placed in RT femoral  Vein There was good blood return, catheter caps were placed on lumens, catheter flushed easily, the line was secured and a sterile dressing and BIO-PATCH applied.   Ultrasound was used to visualize vasculature and guidance of needle.   Number of Attempts: 1 Complications:none  Estimated Blood Loss: none Chest Radiograph indicated and ordered.  Operator: Arbutus Ped, M.D.  [facility]  Medical Director [place] Director [facility] Cardio-Pulmonary Department

## 2016-07-06 NOTE — Procedures (Signed)
Central Venous Catheter Placement: Indication: Patient receiving vesicant or irritant drug.; Patient receiving intravenous therapy for longer than 5 days.; Patient has limited or no vascular access.   Consent:verbal/written  Risks and benefits explained in detail including risk of infection, bleeding, respiratory failure and death..   Hand washing performed prior to starting the procedure.   Procedure: An active timeout was performed and correct patient, name, & ID confirmed.  After explaining risk and benefits, patient was positioned correctly for central venous access. Patient was prepped using strict sterile technique including chlorohexadine preps, sterile drape, sterile gown and sterile gloves.  The area was prepped, draped and anesthetized in the usual sterile manner. Patient comfort was obtained.  A triple lumen catheter was placed in LEFT Internal Jugular Vein There was good blood return, catheter caps were placed on lumens, catheter flushed easily, the line was secured and a sterile dressing and BIO-PATCH applied.   Ultrasound was used to visualize vasculature and guidance of needle.   Number of Attempts: 1 Complications:none Estimated Blood Loss: none Chest Radiograph indicated and ordered.  Operator: Arbutus Ped, M.D.  [facility]  Medical Director [place] Director [facility] Cardio-Pulmonary Department

## 2016-07-06 NOTE — Progress Notes (Signed)
[name] [name] is a 71 y.o. male  [id number]  Primary Cardiologist: [name] Reason for Consultation: Shortness of breath/CHF  HPI: This is a 71 year old white male with a past medical history of coronary artery disease mild aortic stenosis presented to the hospital with severe shortness of breath orthopnea PND and leg swelling as well as swelling of the abdomen. Patient was found to be in renal failure with worsening of the creatinine. No chest pain.   Review of Systems: Patient has orthopnea PND and leg swelling but no chest pain.   Past Medical History:  Diagnosis Date  . CHF (congestive heart failure) ([place])   . COPD (chronic obstructive pulmonary disease) ([place])   . Diabetes mellitus without complication ([place])   . Hypertension   . Kidney stones   . Renal disorder     Medications Prior to Admission  Medication Sig Dispense Refill  . Ascorbic Acid (VITAMIN C) 1000 MG tablet Take 1,000 mg by mouth daily.    Marland Kitchen aspirin 81 MG chewable tablet Chew 81 mg by mouth daily.    . B Complex-C (SUPER B COMPLEX PO) Take 1 tablet by mouth daily.    . Cholecalciferol (VITAMIN D-1000 MAX ST) 1000 UNITS tablet Take 2,000 Units by mouth daily.     . Chromium Picolinate 500 MCG CAPS Take 200 mcg by mouth daily.    . citalopram (CELEXA) 20 MG tablet Take 20 mg by mouth daily.    . fenofibrate 160 MG tablet Take 160 mg by mouth daily.    . fexofenadine (ALLEGRA ALLERGY) 180 MG tablet Take 180 mg by mouth daily.    . fluticasone (FLONASE) 50 MCG/ACT nasal spray Place 2 sprays into both nostrils daily as needed for allergies.   0  . fluticasone-salmeterol (ADVAIR HFA) 115-21 MCG/ACT inhaler Inhale 1 puff into the lungs daily as needed (shortness of breath, wheezing).     . furosemide (LASIX) 20 MG tablet Take 20 mg by mouth daily.    Marland Kitchen gabapentin (NEURONTIN) 100 MG capsule Take 200 mg by mouth 2 (two) times daily.     Marland Kitchen glimepiride (AMARYL) 2 MG tablet Take 2 mg by mouth 2 (two) times  daily.    . Glucosamine-Chondroit-Vit C-Mn (GLUCOSAMINE CHONDROITIN COMPLX) CAPS Take 1 tablet by mouth 2 (two) times daily.    Marland Kitchen losartan (COZAAR) 25 MG tablet Take 25 mg by mouth daily.    . Magnesium 500 MG CAPS Take 250 mg by mouth daily.     . methocarbamol (ROBAXIN) 750 MG tablet Take 750 mg by mouth 2 (two) times daily.     . metoprolol succinate (TOPROL-XL) 50 MG 24 hr tablet Take 50 mg by mouth daily.    . niacin 500 MG tablet Take 500 mg by mouth daily.    . pantoprazole (PROTONIX) 40 MG tablet Take 40 mg by mouth daily.    . Potassium 99 MG TABS Take 1 tablet by mouth daily.    Marland Kitchen spironolactone (ALDACTONE) 25 MG tablet Take 25 mg by mouth daily.    . tamsulosin (FLOMAX) 0.4 MG CAPS capsule Take 1 capsule (0.4 mg total) by mouth daily after supper. 30 capsule 11  . Tiotropium Bromide Monohydrate (SPIRIVA RESPIMAT) 1.25 MCG/ACT AERS Inhale 1 puff into the lungs.    . VENTOLIN HFA 108 (90 BASE) MCG/ACT inhaler Inhale 2 puffs into the lungs every 6 (six) hours as needed for wheezing or shortness of breath.   5  . pravastatin (PRAVACHOL) 20  MG tablet Take 20 mg by mouth daily.  4  . testosterone cypionate (DEPOTESTOSTERONE CYPIONATE) 200 MG/ML injection Inject 1 mL (200 mg total) into the muscle every 14 (fourteen) days. (Patient not taking: Reported on 07/05/2016) 10 mL 1     . azithromycin  500 mg Intravenous Q24H  . budesonide (PULMICORT) nebulizer solution  0.5 mg Nebulization BID  . famotidine (PEPCID) IV  20 mg Intravenous Q24H  . heparin  5,000 Units Subcutaneous Q8H  . insulin aspart  0-9 Units Subcutaneous Q4H  . ipratropium-albuterol  3 mL Nebulization Q4H  . mouth rinse  15 mL Mouth Rinse BID  . methylPREDNISolone (SOLU-MEDROL) injection  40 mg Intravenous Q12H  . sodium chloride flush  3 mL Intravenous Q12H    Infusions: . furosemide (LASIX) infusion 8 mg/hr (07/05/16 1929)    No Known Allergies  Social History   Social History  . Marital status: Married     Spouse name: N/A  . Number of children: N/A  . Years of education: N/A   Occupational History  . Not on file.   Social History Main Topics  . Smoking status: Former Research scientist (life sciences)  . Smokeless tobacco: Former Systems developer    Quit date: 05/16/2004  . Alcohol use No  . Drug use: Unknown  . Sexual activity: Not on file   Other Topics Concern  . Not on file   Social History Narrative  . No narrative on file    History reviewed. No pertinent family history.  PHYSICAL EXAM: Vitals:   07/06/16 0700 07/06/16 0800  BP: 114/73 (!) 122/55  Pulse: 94 (!) 108  Resp: 15 (!) 26  Temp:  98.5 F (36.9 C)     Intake/Output Summary (Last 24 hours) at 07/06/16 0854 Last data filed at 07/06/16 0539  Gross per 24 hour  Intake           718.03 ml  Output             1000 ml  Net          -281.97 ml    General:  Well appearing. No respiratory difficulty HEENT: normal Neck: supple. no JVD. Carotids 2+ bilat; no bruits. No lymphadenopathy or thryomegaly appreciated. Cor: PMI nondisplaced. Regular rate & rhythm. No rubs, gallops or murmurs. Lungs: clear Abdomen: soft, nontender, nondistended. No hepatosplenomegaly. No bruits or masses. Good bowel sounds. Extremities: no cyanosis, clubbing, rash, edema Neuro: alert & oriented x 3, cranial nerves grossly intact. moves all 4 extremities w/o difficulty. Affect pleasant.  MGQ:QPYPP tachycardia with nonspecific ST-T changes  Results for orders placed or performed during the hospital encounter of 07/05/16 (from the past 24 hour(s))  Glucose, capillary     Status: Abnormal   Collection Time: 07/05/16 10:26 AM  Result Value Ref Range   Glucose-Capillary 167 (H) 65 - 99 mg/dL  MRSA PCR Screening     Status: None   Collection Time: 07/05/16 10:29 AM  Result Value Ref Range   MRSA by PCR NEGATIVE NEGATIVE  Glucose, capillary     Status: Abnormal   Collection Time: 07/05/16 11:09 AM  Result Value Ref Range   Glucose-Capillary 158 (H) 65 - 99 mg/dL   Influenza panel by PCR (type A & B)     Status: None   Collection Time: 07/05/16 12:30 PM  Result Value Ref Range   Influenza A By PCR NEGATIVE NEGATIVE   Influenza B By PCR NEGATIVE NEGATIVE  Troponin I (q 6hr x 3)  Status: Abnormal   Collection Time: 07/05/16  3:33 PM  Result Value Ref Range   Troponin I 0.11 (HH) <0.03 ng/mL  Glucose, capillary     Status: Abnormal   Collection Time: 07/05/16  4:23 PM  Result Value Ref Range   Glucose-Capillary 196 (H) 65 - 99 mg/dL  Potassium     Status: None   Collection Time: 07/05/16  6:13 PM  Result Value Ref Range   Potassium 4.8 3.5 - 5.1 mmol/L  Glucose, capillary     Status: Abnormal   Collection Time: 07/05/16  7:35 PM  Result Value Ref Range   Glucose-Capillary 160 (H) 65 - 99 mg/dL  Troponin I (q 6hr x 3)     Status: Abnormal   Collection Time: 07/05/16  9:12 PM  Result Value Ref Range   Troponin I 0.12 (HH) <0.03 ng/mL  Glucose, capillary     Status: Abnormal   Collection Time: 07/06/16 12:15 AM  Result Value Ref Range   Glucose-Capillary 135 (H) 65 - 99 mg/dL  Troponin I (q 6hr x 3)     Status: Abnormal   Collection Time: 07/06/16  2:36 AM  Result Value Ref Range   Troponin I 0.30 (HH) <0.03 ng/mL  CBC     Status: Abnormal   Collection Time: 07/06/16  2:36 AM  Result Value Ref Range   WBC 8.1 3.8 - 10.6 K/uL   RBC 4.19 (L) 4.40 - 5.90 MIL/uL   Hemoglobin 14.3 13.0 - 18.0 g/dL   HCT 41.7 40.0 - 52.0 %   MCV 99.6 80.0 - 100.0 fL   MCH 34.1 (H) 26.0 - 34.0 pg   MCHC 34.2 32.0 - 36.0 g/dL   RDW 14.8 (H) 11.5 - 14.5 %   Platelets 203 150 - 440 K/uL  Basic metabolic panel     Status: Abnormal   Collection Time: 07/06/16  2:36 AM  Result Value Ref Range   Sodium 138 135 - 145 mmol/L   Potassium 4.9 3.5 - 5.1 mmol/L   Chloride 100 (L) 101 - 111 mmol/L   CO2 27 22 - 32 mmol/L   Glucose, Bld 172 (H) 65 - 99 mg/dL   BUN 53 (H) 6 - 20 mg/dL   Creatinine, Ser 3.35 (H) 0.61 - 1.24 mg/dL   Calcium 9.1 8.9 - 10.3 mg/dL    GFR calc non Af Amer 17 (L) >60 mL/min   GFR calc Af Amer 20 (L) >60 mL/min   Anion gap 11 5 - 15  Procalcitonin     Status: None   Collection Time: 07/06/16  2:36 AM  Result Value Ref Range   Procalcitonin 0.10 ng/mL  Glucose, capillary     Status: Abnormal   Collection Time: 07/06/16  3:42 AM  Result Value Ref Range   Glucose-Capillary 162 (H) 65 - 99 mg/dL  Blood gas, arterial     Status: None   Collection Time: 07/06/16  4:31 AM  Result Value Ref Range   FIO2 0.45    Delivery systems BILEVEL POSITIVE AIRWAY PRESSURE    Inspiratory PAP 14    Expiratory PAP 6.0    pH, Arterial 7.35 7.350 - 7.450   pCO2 arterial 48 32.0 - 48.0 mmHg   pO2, Arterial 89 83.0 - 108.0 mmHg   Bicarbonate 26.5 20.0 - 28.0 mmol/L   Acid-Base Excess 0.2 0.0 - 2.0 mmol/L   O2 Saturation 96.4 %   Patient temperature 37.0    Collection site LEFT RADIAL  Sample type ARTERIAL DRAW    Allens test (pass/fail) PASS PASS   Mechanical Rate 8   Glucose, capillary     Status: Abnormal   Collection Time: 07/06/16  7:33 AM  Result Value Ref Range   Glucose-Capillary 156 (H) 65 - 99 mg/dL   Dg Chest Port 1 View  Result Date: 07/06/2016 CLINICAL DATA:  Interval change, CHF. EXAM: PORTABLE CHEST 1 VIEW COMPARISON:  Chest radiograph July 05, 2016 FINDINGS: Cardiac silhouette is mildly enlarged and unchanged. Calcified aortic knob. Similar interstitial prominence and increased lung volumes. Trace residual pleural effusions, improved. No pneumothorax. Soft tissue planes and included osseous structures are unchanged. IMPRESSION: Cardiomegaly and resolving interstitial edema on a background of COPD.Trace residual pleural effusions, improved. Electronically Signed   By: Elon Alas M.D.   On: 07/06/2016 05:24   Dg Chest Port 1 View  Result Date: 07/05/2016 CLINICAL DATA:  Onset of shortness of breath last night made worse today, on CPAP, oxygen saturations 70% on 15 L. Increased work of breathing. History of COPD,  CHF, former smoker. EXAM: PORTABLE CHEST 1 VIEW COMPARISON:  Chest x-ray of February 15, 2016 FINDINGS: The lungs are well-expanded. The interstitial markings are increased. The pulmonary vascularity is engorged. The cardiac silhouette is enlarged. There small bilateral pleural effusions. There is no pneumothorax. IMPRESSION: CHF with interstitial edema superimposed upon COPD. No discrete pneumonia. Electronically Signed   By: David  Martinique M.D.   On: 07/05/2016 08:45     ASSESSMENT AND PLAN:Congestive heart failure with history of diastolic and systolic dysfunction and mild aortic stenosis and coronary artery disease. Patient has mildly elevated troponin due to demand ischemia and renal failure. Patient is supposed to get dialysis because of acute renal failure and fluid overload. Will get echocardiogram to further evaluate ejection fraction and aortic stenosis.  Rudolf Blizard A

## 2016-07-06 NOTE — Progress Notes (Signed)
Name: [name] [name] MRN:   [id number] DOB:   [birth year]         ADMISSION DATE:  07/05/2016 CONSULTATION DATE:  07/05/16  REFERRING MD:  Dr. Drenda Freeze  CHIEF COMPLAINT:  Shortness of Breath   DISCUSSION: [name] [name] is a 71 y.o. Male who presented to [facility] ED on 3/14 with c/o of progressive shortness of breath.  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  He is admitted to [facility] ICU for treatment of Acute Hypoxic Hypercapnic Respiratory Failure secondary to to CHF exacerbation, AECOPD, and Acute Kidney Injury-progression/and worsening of  of cardio-renal syndrome    No Known Allergies  No current facility-administered medications on file prior to encounter.        Current Outpatient Prescriptions on File Prior to Encounter  Medication Sig  . Ascorbic Acid (VITAMIN C) 1000 MG tablet Take 1,000 mg by mouth daily.  Marland Kitchen aspirin 81 MG chewable tablet Chew 81 mg by mouth daily.  . Cholecalciferol (VITAMIN D-1000 MAX ST) 1000 UNITS tablet Take 2,000 Units by mouth daily.   . Chromium Picolinate 500 MCG CAPS Take 200 mcg by mouth daily.  . citalopram (CELEXA) 20 MG tablet Take 20 mg by mouth daily.  . fenofibrate 160 MG tablet Take 160 mg by mouth daily.  . fexofenadine (ALLEGRA ALLERGY) 180 MG tablet Take 180 mg by mouth daily.  . fluticasone (FLONASE) 50 MCG/ACT nasal spray Place 2 sprays into both nostrils daily as needed for allergies.   . fluticasone-salmeterol (ADVAIR HFA) 115-21 MCG/ACT inhaler Inhale 1 puff into the lungs daily as needed (shortness of breath, wheezing).   . furosemide (LASIX) 20 MG tablet Take 20 mg by mouth daily.  Marland Kitchen gabapentin (NEURONTIN) 100 MG capsule Take 200 mg by mouth 2 (two) times daily.   . Glucosamine-Chondroit-Vit C-Mn (GLUCOSAMINE CHONDROITIN COMPLX) CAPS Take 1 tablet by mouth 2 (two) times daily.  Marland Kitchen losartan (COZAAR) 25 MG tablet Take 25 mg by mouth daily.  . Magnesium 500 MG CAPS Take 250  mg by mouth daily.   . methocarbamol (ROBAXIN) 750 MG tablet Take 750 mg by mouth 2 (two) times daily.   . metoprolol succinate (TOPROL-XL) 50 MG 24 hr tablet Take 50 mg by mouth daily.  . niacin 500 MG tablet Take 500 mg by mouth daily.  . pantoprazole (PROTONIX) 40 MG tablet Take 40 mg by mouth daily.  . tamsulosin (FLOMAX) 0.4 MG CAPS capsule Take 1 capsule (0.4 mg total) by mouth daily after supper.  . VENTOLIN HFA 108 (90 BASE) MCG/ACT inhaler Inhale 2 puffs into the lungs every 6 (six) hours as needed for wheezing or shortness of breath.   . pravastatin (PRAVACHOL) 20 MG tablet Take 20 mg by mouth daily.  Marland Kitchen testosterone cypionate (DEPOTESTOSTERONE CYPIONATE) 200 MG/ML injection Inject 1 mL (200 mg total) into the muscle every 14 (fourteen) days. (Patient not taking: Reported on 07/05/2016)    FAMILY HISTORY:  His has no family status information on file.  +Copd  SOCIAL HISTORY: He  reports that he has quit smoking. He quit smokeless tobacco use about 12 years ago. He reports that he does not drink alcohol.  REVIEW OF SYSTEMS: per family  Positives in BOLD Gen: Denies fever, chills, +weight change, fatigue, night sweats HEENT: Denies blurred vision, double vision, hearing loss, tinnitus, sinus congestion, rhinorrhea, sore throat, neck stiffness, dysphagia PULM: Denies +shortness of breath, cough, sputum production, hemoptysis, wheezing CV: Denies chest pain, edema,  orthopnea, paroxysmal nocturnal dyspnea, palpitations GI: Denies abdominal pain, nausea, vomiting, diarrhea, hematochezia, melena, +constipation, change in bowel habits GU: Denies dysuria, hematuria, polyuria, oliguria, urethral discharge Endocrine: Denies hot or cold intolerance, polyuria, polyphagia or appetite change Derm: Denies rash, dry skin, scaling or peeling skin change Heme: Denies easy bruising, bleeding, bleeding gums Neuro: Denies headache, numbness, weakness, slurred speech, loss of memory or  consciousness  VITAL SIGNS: BP 107/82   Pulse (!) 111   Temp 98.1 F (36.7 C) (Axillary)   Resp (!) 28   Ht 5\' 6"  (1.676 m)   Wt 107.8 kg (237 lb 10.5 oz)   SpO2 97%   BMI 38.36 kg/m   HEMODYNAMICS:  VENTILATOR SETTINGS: FiO2 (%):  [35 %-60 %] 45 %  INTAKE / OUTPUT: I/O last 3 completed shifts: In: 3 [P.O.:240; IV Piggyback:350] Out: 400 [Urine:400]  PHYSICAL EXAMINATION: General:   Obese, white male on BiPAPA, no acute distress  Neuro:  Alert and Oriented, Follows commands, PERRL HEENT:  Atraumatic, Normocephalic, No JVD Cardiovascular:  RRR, S1S2 No R/M/G Lungs:diminished throughout ,expiratory wheezing to bilateral lower lobes and crackles to right lower lobe, symmetrical expansion Abdomen:  Obese, Slightly distended, Soft, Non-tender, BS positive x4 Musculoskeletal:  Normal bulk and tone, 1+ generalized edema, 2+ edema to bilateral lower extremities Skin:  Dry, intact. No rashes, lesions, or ulcerations.  LABS:  BMET  Last Labs    Recent Labs Lab 07/05/16 0825 07/05/16 1813  NA 140  --   K 4.9 4.8  CL 103  --   CO2 28  --   BUN 38*  --   CREATININE 2.28*  --   GLUCOSE 198*  --       Electrolytes  Last Labs    Recent Labs Lab 07/05/16 0825  CALCIUM 9.2      CBC  Last Labs    Recent Labs Lab 07/05/16 0825  WBC 9.8  HGB 15.5  HCT 45.7  PLT 218      Coag's Last Labs   No results for input(s): APTT, INR in the last 168 hours.    Sepsis Markers  Last Labs    Recent Labs Lab 07/05/16 0825  PROCALCITON <0.10      ABG  Last Labs    Recent Labs Lab 07/05/16 0824  PHART 7.35  PCO2ART 49*  PO2ART 78*      Liver Enzymes  Last Labs    Recent Labs Lab 07/05/16 0825  AST 29  ALT 18  ALKPHOS 38  BILITOT 1.0  ALBUMIN 3.9      Cardiac Enzymes  Last Labs    Recent Labs Lab 07/05/16 0825 07/05/16 1533 07/05/16 2112  TROPONINI 0.10* 0.11* 0.12*      Glucose  Last Labs     Recent Labs Lab 07/05/16 1026 07/05/16 1109 07/05/16 1623 07/05/16 1935 07/06/16 0015  GLUCAP 167* 158* 196* 160* 135*      Imaging Dg Chest Port 1 View  Result Date: 07/05/2016 CLINICAL DATA:  Onset of shortness of breath last night made worse today, on CPAP, oxygen saturations 70% on 15 L. Increased work of breathing. History of COPD, CHF, former smoker. EXAM: PORTABLE CHEST 1 VIEW COMPARISON:  Chest x-ray of February 15, 2016 FINDINGS: The lungs are well-expanded. The interstitial markings are increased. The pulmonary vascularity is engorged. The cardiac silhouette is enlarged. There small bilateral pleural effusions. There is no pneumothorax. IMPRESSION: CHF with interstitial edema superimposed upon COPD. No discrete pneumonia. Electronically Signed  By: David  Martinique M.D.   On: 07/05/2016 08:45     STUDIES:  None  CULTURES: 3/14 Blood cultures x2>> 3/14 Influenza PCR>>  ANTIBIOTICS: 3/14 Azithromycin>> 3/14 Ceftriaxone>>3/14  SIGNIFICANT EVENTS: 3/14 Admission to [facility] ICU for Acute Hypoxic Hypercapnic Respiratory Failure  LINES/TUBES: None ASSESSMENT / PLAN:  PULMONARY A: Acute Hypoxic Hypercapnic Respiratory Failure secondary to CHF exacerbation & AECOPD Hx: COPD, 3L Home O2  P:   Maintain O2 saturations >88% Supplemental O2 to maintain O2 saturations Continuous Bipap Scheduled Duoneb & Budesonide nebs IV Solumedrol, taper Abx as above CXR 3/15- Improving interstitial edema    CARDIOVASCULAR A:  CHF exacerbation Elevated troponin likely secondary to demand ischemia Hx: CHF, HTN, CAD  P:  Continuous Telemetry monitoring Obtain Echocardiogram Trend Troponin's Hold home Losartan, Metoprolol at this time due to marginal BP Will await Nephrology recommendations on Diuresis due to AKI Cardiology consulted RENAL A:   Acute Kidney injury,worsening renal function Hx: Kidney stones, Renal disorder  P:   Monitor I&O Urine  output>1048ml/24hrs Follow BMP's closely  Consult Nephrology, appreciate input Nephrology consulted, appreciate input Avoid nephrotoxic drgs Lasix gtt  GASTROINTESTINAL A:   Constipation  P:   Will Keep NPO for now Pepcid for SUP  Will start bowel regimen for constipation once more stable from respiratory standpoint  HEMATOLOGIC A:   No acute issues  P:  Monitor for S/Sx of bleeding Heparin for VTE prophylaxis Transfuse for Hbg <7  INFECTIOUS A:   AECOPD  P:   Follow CBC's Monitor Fever curve Follow Blood cultures Abx as above for AECOPD   ENDOCRINE A:   Hyperglycemia Hx: DM    P:   CBG q4h SSI q4h Follow Hypo/Hyperglycemia protocol Will hold home Glimepiride at this time  NEUROLOGIC A:   No acute issues  P:   Provide supportive care Avoid sedating medications Lights on during the day    [name] [name],AG-ACNP Pulmonary & Critical Care

## 2016-07-06 NOTE — Progress Notes (Signed)
Patient unable to eat dinner this evening d/t to SOB. Per Hinton Dyer, NP okay to order patient ensure for in between meals. Wilnette Kales

## 2016-07-06 NOTE — Care Management (Signed)
Patient admitted to icu due to need for continuous bipap.  he has chronic home 02 for copd/CHF. Required lasix drip which is discontinued due to renal failure.  Acute renal failure is going to require renal replacement therapy

## 2016-07-06 NOTE — Progress Notes (Signed)
CRRT Medication Management:    No further adjustments warranted at this time.    Pharmacy will continue to monitor.   MLS  [phone].

## 2016-07-06 NOTE — Progress Notes (Signed)
*  PRELIMINARY RESULTS* Echocardiogram 2D Echocardiogram has been performed.  Sherrie Sport 07/06/2016, 3:34 PM

## 2016-07-06 NOTE — Progress Notes (Signed)
Patient was on famotidine 20 mg IV q12h. Dose for CrCl < 50 ml/min is famotidine 20 mg daily.  Will dose adjust to famotidine 20 mg daily per CrCl of 22.9 ml/min.  Tobie Lords, PharmD, BCPS Clinical Pharmacist 07/06/2016

## 2016-07-06 NOTE — Progress Notes (Signed)
[facility] Progress Note Patient Name: [name] [name] DOB: 09/13/1945 MRN: [id number]   Date of Service  07/06/2016  HPI/Events of Note  Patient is on a Healthy Heart / Carb modified diet. Having difficulty finishing meals d/t SOB. Nurse request diet supplement.   eICU Interventions  Will order: 1. Glucerna shake TID between meals.      Intervention Category Minor Interventions: Routine modifications to care plan (e.g. PRN medications for pain, fever)  Dionicio Shelnutt Eugene 07/06/2016, 6:45 PM

## 2016-07-06 NOTE — Progress Notes (Deleted)
PULMONARY / CRITICAL CARE MEDICINE   Name: [name] [name] MRN: [id number] DOB: 23-Feb-1946    ADMISSION DATE:  07/05/2016 CONSULTATION DATE:  07/05/16  REFERRING MD:  Dr. Drenda Freeze  CHIEF COMPLAINT:  Shortness of Breath   DISCUSSION: [name] is a 71 y.o. Male who presented to [facility] ED on 3/14 with c/o of progressive shortness of breath.  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  He is admitted to [facility] ICU for treatment of Acute Hypoxic Hypercapnic Respiratory Failure secondary to to CHF exacerbation, AECOPD, and Acute Kidney Injury-progression/and worsening of  of cardio-renal syndrome    No Known Allergies  No current facility-administered medications on file prior to encounter.    Current Outpatient Prescriptions on File Prior to Encounter  Medication Sig  . Ascorbic Acid (VITAMIN C) 1000 MG tablet Take 1,000 mg by mouth daily.  Marland Kitchen aspirin 81 MG chewable tablet Chew 81 mg by mouth daily.  . Cholecalciferol (VITAMIN D-1000 MAX ST) 1000 UNITS tablet Take 2,000 Units by mouth daily.   . Chromium Picolinate 500 MCG CAPS Take 200 mcg by mouth daily.  . citalopram (CELEXA) 20 MG tablet Take 20 mg by mouth daily.  . fenofibrate 160 MG tablet Take 160 mg by mouth daily.  . fexofenadine (ALLEGRA ALLERGY) 180 MG tablet Take 180 mg by mouth daily.  . fluticasone (FLONASE) 50 MCG/ACT nasal spray Place 2 sprays into both nostrils daily as needed for allergies.   . fluticasone-salmeterol (ADVAIR HFA) 115-21 MCG/ACT inhaler Inhale 1 puff into the lungs daily as needed (shortness of breath, wheezing).   . furosemide (LASIX) 20 MG tablet Take 20 mg by mouth daily.  Marland Kitchen gabapentin (NEURONTIN) 100 MG capsule Take 200 mg by mouth 2 (two) times daily.   . Glucosamine-Chondroit-Vit C-Mn (GLUCOSAMINE CHONDROITIN COMPLX) CAPS Take 1 tablet by mouth 2 (two) times daily.  Marland Kitchen losartan (COZAAR) 25 MG tablet Take 25 mg by mouth daily.  . Magnesium 500 MG  CAPS Take 250 mg by mouth daily.   . methocarbamol (ROBAXIN) 750 MG tablet Take 750 mg by mouth 2 (two) times daily.   . metoprolol succinate (TOPROL-XL) 50 MG 24 hr tablet Take 50 mg by mouth daily.  . niacin 500 MG tablet Take 500 mg by mouth daily.  . pantoprazole (PROTONIX) 40 MG tablet Take 40 mg by mouth daily.  . tamsulosin (FLOMAX) 0.4 MG CAPS capsule Take 1 capsule (0.4 mg total) by mouth daily after supper.  . VENTOLIN HFA 108 (90 BASE) MCG/ACT inhaler Inhale 2 puffs into the lungs every 6 (six) hours as needed for wheezing or shortness of breath.   . pravastatin (PRAVACHOL) 20 MG tablet Take 20 mg by mouth daily.  Marland Kitchen testosterone cypionate (DEPOTESTOSTERONE CYPIONATE) 200 MG/ML injection Inject 1 mL (200 mg total) into the muscle every 14 (fourteen) days. (Patient not taking: Reported on 07/05/2016)    FAMILY HISTORY:  His has no family status information on file.  +Copd  SOCIAL HISTORY: He  reports that he has quit smoking. He quit smokeless tobacco use about 12 years ago. He reports that he does not drink alcohol.  REVIEW OF SYSTEMS: per family  Positives in BOLD Gen: Denies fever, chills, +weight change, fatigue, night sweats HEENT: Denies blurred vision, double vision, hearing loss, tinnitus, sinus congestion, rhinorrhea, sore throat, neck stiffness, dysphagia PULM: Denies +shortness of breath, cough, sputum production, hemoptysis, wheezing CV: Denies chest pain, edema, orthopnea, paroxysmal nocturnal dyspnea, palpitations GI: Denies  abdominal pain, nausea, vomiting, diarrhea, hematochezia, melena, +constipation, change in bowel habits GU: Denies dysuria, hematuria, polyuria, oliguria, urethral discharge Endocrine: Denies hot or cold intolerance, polyuria, polyphagia or appetite change Derm: Denies rash, dry skin, scaling or peeling skin change Heme: Denies easy bruising, bleeding, bleeding gums Neuro: Denies headache, numbness, weakness, slurred speech, loss of memory or  consciousness  VITAL SIGNS: BP 107/82   Pulse (!) 111   Temp 98.1 F (36.7 C) (Axillary)   Resp (!) 28   Ht 5\' 6"  (1.676 m)   Wt 107.8 kg (237 lb 10.5 oz)   SpO2 97%   BMI 38.36 kg/m   HEMODYNAMICS:    VENTILATOR SETTINGS: FiO2 (%):  [35 %-60 %] 45 %  INTAKE / OUTPUT: I/O last 3 completed shifts: In: 5 [P.O.:240; IV Piggyback:350] Out: 400 [Urine:400]  PHYSICAL EXAMINATION: General:   Obese, white male on BiPAPA, no acute distress  Neuro:  Alert and Oriented, Follows commands, PERRL HEENT:  Atraumatic, Normocephalic, No JVD Cardiovascular:  RRR, S1S2 No R/M/G Lungs:diminished throughout ,expiratory wheezing to bilateral lower lobes and crackles to right lower lobe, symmetrical expansion Abdomen:  Obese, Slightly distended, Soft, Non-tender, BS positive x4 Musculoskeletal:  Normal bulk and tone, 1+ generalized edema, 2+ edema to bilateral lower extremities Skin:  Dry, intact. No rashes, lesions, or ulcerations.  LABS:  BMET  Recent Labs Lab 07/05/16 0825 07/05/16 1813  NA 140  --   K 4.9 4.8  CL 103  --   CO2 28  --   BUN 38*  --   CREATININE 2.28*  --   GLUCOSE 198*  --     Electrolytes  Recent Labs Lab 07/05/16 0825  CALCIUM 9.2    CBC  Recent Labs Lab 07/05/16 0825  WBC 9.8  HGB 15.5  HCT 45.7  PLT 218    Coag's No results for input(s): APTT, INR in the last 168 hours.  Sepsis Markers  Recent Labs Lab 07/05/16 0825  PROCALCITON <0.10    ABG  Recent Labs Lab 07/05/16 0824  PHART 7.35  PCO2ART 49*  PO2ART 78*    Liver Enzymes  Recent Labs Lab 07/05/16 0825  AST 29  ALT 18  ALKPHOS 38  BILITOT 1.0  ALBUMIN 3.9    Cardiac Enzymes  Recent Labs Lab 07/05/16 0825 07/05/16 1533 07/05/16 2112  TROPONINI 0.10* 0.11* 0.12*    Glucose  Recent Labs Lab 07/05/16 1026 07/05/16 1109 07/05/16 1623 07/05/16 1935 07/06/16 0015  GLUCAP 167* 158* 196* 160* 135*    Imaging Dg Chest Port 1 View  Result Date:  07/05/2016 CLINICAL DATA:  Onset of shortness of breath last night made worse today, on CPAP, oxygen saturations 70% on 15 L. Increased work of breathing. History of COPD, CHF, former smoker. EXAM: PORTABLE CHEST 1 VIEW COMPARISON:  Chest x-ray of February 15, 2016 FINDINGS: The lungs are well-expanded. The interstitial markings are increased. The pulmonary vascularity is engorged. The cardiac silhouette is enlarged. There small bilateral pleural effusions. There is no pneumothorax. IMPRESSION: CHF with interstitial edema superimposed upon COPD. No discrete pneumonia. Electronically Signed   By: David  Martinique M.D.   On: 07/05/2016 08:45     STUDIES:  None  CULTURES: 3/14 Blood cultures x2>> 3/14 Influenza PCR>>  ANTIBIOTICS: 3/14 Azithromycin>> 3/14 Ceftriaxone>>3/14  SIGNIFICANT EVENTS: 3/14 Admission to [facility] ICU for Acute Hypoxic Hypercapnic Respiratory Failure  LINES/TUBES: None ASSESSMENT / PLAN:  PULMONARY A: Acute Hypoxic Hypercapnic Respiratory Failure secondary to CHF exacerbation & AECOPD Hx:  COPD, 3L Home O2  P:   Maintain O2 saturations >88% Supplemental O2 to maintain O2 saturations Continuous Bipap Scheduled Duoneb & Budesonide nebs IV Solumedrol, taper Abx as above CXR in AM 3/15    CARDIOVASCULAR A:  CHF exacerbation Elevated troponin likely secondary to demand ischemia Hx: CHF, HTN, CAD  P:  Continuous Telemetry monitoring Obtain Echocardiogram Trend Troponin's Hold home Losartan, Metoprolol at this time due to marginal BP Will await Nephrology recommendations on Diuresis due to AKI  RENAL A:   Acute Kidney injury Hx: Kidney stones, Renal disorder  P:   Monitor I&O Follow BMP's closely  Consult Nephrology, appreciate input Nephrology consulted, appreciate input Avoid nephrotoxic drgs Lasix gtt  GASTROINTESTINAL A:   Constipation  P:   Will Keep NPO for now Pepcid for SUP  Will start bowel regimen for constipation once more stable  from respiratory standpoint  HEMATOLOGIC A:   No acute issues  P:  Monitor for S/Sx of bleeding Heparin for VTE prophylaxis Transfuse for Hbg <7  INFECTIOUS A:   AECOPD  P:   Follow CBC's Monitor Fever curve Follow Blood cultures Abx as above for AECOPD   ENDOCRINE A:   Hyperglycemia Hx: DM    P:   CBG q4h SSI q4h Follow Hypo/Hyperglycemia protocol Will hold home Glimepiride at this time  NEUROLOGIC A:   No acute issues  P:   Provide supportive care Avoid sedating medications Lights on during the day    Bincy Varughese,AG-ACNP Pulmonary & Critical Care

## 2016-07-07 ENCOUNTER — Inpatient Hospital Stay: Payer: PPO

## 2016-07-07 LAB — RENAL FUNCTION PANEL
ALBUMIN: 3.6 g/dL (ref 3.5–5.0)
ALBUMIN: 3.8 g/dL (ref 3.5–5.0)
ANION GAP: 5 (ref 5–15)
ANION GAP: 6 (ref 5–15)
Albumin: 3.1 g/dL — ABNORMAL LOW (ref 3.5–5.0)
Albumin: 3.5 g/dL (ref 3.5–5.0)
Albumin: 3.7 g/dL (ref 3.5–5.0)
Albumin: 3.6 g/dL (ref 3.5–5.0)
Anion gap: 7 (ref 5–15)
Anion gap: 7 (ref 5–15)
Anion gap: 6 (ref 5–15)
Anion gap: 5 (ref 5–15)
BUN: 45 mg/dL — AB (ref 6–20)
BUN: 38 mg/dL — ABNORMAL HIGH (ref 6–20)
BUN: 43 mg/dL — AB (ref 6–20)
BUN: 38 mg/dL — ABNORMAL HIGH (ref 6–20)
BUN: 34 mg/dL — AB (ref 6–20)
BUN: 33 mg/dL — AB (ref 6–20)
CALCIUM: 7.4 mg/dL — AB (ref 8.9–10.3)
CALCIUM: 9 mg/dL (ref 8.9–10.3)
CALCIUM: 8.8 mg/dL — AB (ref 8.9–10.3)
CHLORIDE: 104 mmol/L (ref 101–111)
CHLORIDE: 103 mmol/L (ref 101–111)
CO2: 27 mmol/L (ref 22–32)
CO2: 25 mmol/L (ref 22–32)
CO2: 28 mmol/L (ref 22–32)
CO2: 30 mmol/L (ref 22–32)
CO2: 28 mmol/L (ref 22–32)
CO2: 29 mmol/L (ref 22–32)
CREATININE: 1.63 mg/dL — AB (ref 0.61–1.24)
CREATININE: 1.57 mg/dL — AB (ref 0.61–1.24)
Calcium: 8.7 mg/dL — ABNORMAL LOW (ref 8.9–10.3)
Calcium: 8.3 mg/dL — ABNORMAL LOW (ref 8.9–10.3)
Calcium: 8.7 mg/dL — ABNORMAL LOW (ref 8.9–10.3)
Chloride: 104 mmol/L (ref 101–111)
Chloride: 109 mmol/L (ref 101–111)
Chloride: 104 mmol/L (ref 101–111)
Chloride: 104 mmol/L (ref 101–111)
Creatinine, Ser: 2.2 mg/dL — ABNORMAL HIGH (ref 0.61–1.24)
Creatinine, Ser: 2.15 mg/dL — ABNORMAL HIGH (ref 0.61–1.24)
Creatinine, Ser: 1.8 mg/dL — ABNORMAL HIGH (ref 0.61–1.24)
Creatinine, Ser: 1.42 mg/dL — ABNORMAL HIGH (ref 0.61–1.24)
GFR calc Af Amer: 34 mL/min — ABNORMAL LOW (ref >60)
GFR calc Af Amer: 55 mL/min — ABNORMAL LOW (ref >60)
GFR calc non Af Amer: 40 mL/min — ABNORMAL LOW (ref >60)
GFR calc non Af Amer: 29 mL/min — ABNORMAL LOW (ref >60)
GFR calc non Af Amer: 42 mL/min — ABNORMAL LOW (ref >60)
GFR, EST AFRICAN AMERICAN: 33 mL/min — AB (ref >60)
GFR, EST AFRICAN AMERICAN: 47 mL/min — AB (ref >60)
GFR, EST AFRICAN AMERICAN: 42 mL/min — AB (ref >60)
GFR, EST AFRICAN AMERICAN: 49 mL/min — AB (ref >60)
GFR, EST NON AFRICAN AMERICAN: 28 mL/min — AB (ref >60)
GFR, EST NON AFRICAN AMERICAN: 36 mL/min — AB (ref >60)
GFR, EST NON AFRICAN AMERICAN: 48 mL/min — AB (ref >60)
GLUCOSE: 148 mg/dL — AB (ref 65–99)
GLUCOSE: 177 mg/dL — AB (ref 65–99)
Glucose, Bld: 152 mg/dL — ABNORMAL HIGH (ref 65–99)
Glucose, Bld: 147 mg/dL — ABNORMAL HIGH (ref 65–99)
Glucose, Bld: 164 mg/dL — ABNORMAL HIGH (ref 65–99)
Glucose, Bld: 185 mg/dL — ABNORMAL HIGH (ref 65–99)
PHOSPHORUS: 3.8 mg/dL (ref 2.5–4.6)
PHOSPHORUS: 3.1 mg/dL (ref 2.5–4.6)
POTASSIUM: 4.8 mmol/L (ref 3.5–5.1)
POTASSIUM: 4.8 mmol/L (ref 3.5–5.1)
Phosphorus: 3.5 mg/dL (ref 2.5–4.6)
Phosphorus: 4.1 mg/dL (ref 2.5–4.6)
Phosphorus: 3.3 mg/dL (ref 2.5–4.6)
Phosphorus: 2.8 mg/dL (ref 2.5–4.6)
Potassium: 4.2 mmol/L (ref 3.5–5.1)
Potassium: 4.8 mmol/L (ref 3.5–5.1)
Potassium: 4.9 mmol/L (ref 3.5–5.1)
Potassium: 4.7 mmol/L (ref 3.5–5.1)
SODIUM: 141 mmol/L (ref 135–145)
SODIUM: 138 mmol/L (ref 135–145)
Sodium: 138 mmol/L (ref 135–145)
Sodium: 138 mmol/L (ref 135–145)
Sodium: 138 mmol/L (ref 135–145)
Sodium: 138 mmol/L (ref 135–145)

## 2016-07-07 LAB — PHOSPHORUS: Phosphorus: 3 mg/dL (ref 2.5–4.6)

## 2016-07-07 LAB — GLUCOSE, CAPILLARY
GLUCOSE-CAPILLARY: 163 mg/dL — AB (ref 65–99)
GLUCOSE-CAPILLARY: 181 mg/dL — AB (ref 65–99)
GLUCOSE-CAPILLARY: 147 mg/dL — AB (ref 65–99)
GLUCOSE-CAPILLARY: 105 mg/dL — AB (ref 65–99)
Glucose-Capillary: 147 mg/dL — ABNORMAL HIGH (ref 65–99)
Glucose-Capillary: 137 mg/dL — ABNORMAL HIGH (ref 65–99)

## 2016-07-07 LAB — C DIFFICILE QUICK SCREEN W PCR REFLEX
C DIFFICILE (CDIFF) INTERP: NOT DETECTED
C Diff antigen: NEGATIVE
C Diff toxin: NEGATIVE

## 2016-07-07 LAB — ECHOCARDIOGRAM COMPLETE
Height: 66 in
WEIGHTICAEL: 3813.08 [oz_av]

## 2016-07-07 LAB — MAGNESIUM: MAGNESIUM: 2.2 mg/dL (ref 1.7–2.4)

## 2016-07-07 MED ORDER — SALINE SPRAY 0.65 % NA SOLN
1.0000 | NASAL | Status: DC | PRN
  Administered 2016-07-07: 1 via NASAL
  Filled 2016-07-07: qty 44

## 2016-07-07 MED ORDER — ALTEPLASE 2 MG IJ SOLR
2.0000 mg | Freq: Once | INTRAMUSCULAR | Status: AC
  Administered 2016-07-07: 2 mg
  Filled 2016-07-07: qty 2

## 2016-07-07 MED ORDER — METOCLOPRAMIDE HCL 5 MG/ML IJ SOLN
5.0000 mg | Freq: Three times a day (TID) | INTRAMUSCULAR | Status: DC
  Administered 2016-07-07 – 2016-07-10 (×10): 5 mg via INTRAVENOUS
  Filled 2016-07-07 (×11): qty 2

## 2016-07-07 MED ORDER — FLUTICASONE PROPIONATE 50 MCG/ACT NA SUSP
2.0000 | Freq: Every day | NASAL | Status: DC
  Administered 2016-07-07 – 2016-07-10 (×4): 2 via NASAL
  Filled 2016-07-07: qty 16

## 2016-07-07 MED ORDER — LORAZEPAM 2 MG/ML IJ SOLN
0.5000 mg | Freq: Four times a day (QID) | INTRAMUSCULAR | Status: DC | PRN
Start: 2016-07-07 — End: 2016-07-11
  Administered 2016-07-07 – 2016-07-10 (×5): 0.5 mg via INTRAVENOUS
  Filled 2016-07-07 (×5): qty 1

## 2016-07-07 MED ORDER — TAMSULOSIN HCL 0.4 MG PO CAPS
0.4000 mg | ORAL_CAPSULE | Freq: Every day | ORAL | Status: DC
  Administered 2016-07-07 – 2016-07-10 (×4): 0.4 mg via ORAL
  Filled 2016-07-07 (×4): qty 1

## 2016-07-07 MED ORDER — STERILE WATER FOR INJECTION IJ SOLN
INTRAMUSCULAR | Status: AC
  Filled 2016-07-07: qty 10

## 2016-07-07 NOTE — Progress Notes (Addendum)
At 05:03, venous pressure drastically increased on CRRT machine. Flushed accesses and red port that venous access was attached to was unable to be flushed. Rinsed back blood through blue port. tPA administered to red port per orders while blue port heparin locked per orders. At 07:51, red port will be able to be reassessed for use.  Overall patient's vital signs stable overnight, though patient could be tachypneic when awake. Unable to tolerate off bipap (see previous note). Abdominal x-ray results still pending at this time (see previous note on abnormal bowel movement and abdominal distention). Patient was unable to sleep until around 03:00 and then began sleeping in between care. One dose of prn morphine given for anxiety overnight.

## 2016-07-07 NOTE — Progress Notes (Signed)
Patient had a 7 beat run of vtach. Hinton Dyer, NP notified. Magnesium and phosphorus ordered. Wilnette Kales

## 2016-07-07 NOTE — Progress Notes (Signed)
SUBJECTIVE: Patient is getting dialysis and is feeling much better   Vitals:   07/07/16 0500 07/07/16 0600 07/07/16 0700 07/07/16 0800  BP: 134/71 126/83 (!) 164/92 (!) 178/162  Pulse: 92 92 (!) 108 (!) 104  Resp: (!) 26 15  19   Temp:    97.7 F (36.5 C)  TempSrc:    Oral  SpO2: 94% 93% 93% 93%  Weight:      Height:        Intake/Output Summary (Last 24 hours) at 07/07/16 0847 Last data filed at 07/07/16 0700  Gross per 24 hour  Intake              368 ml  Output             2169 ml  Net            -1801 ml    LABS: Basic Metabolic Panel:  Recent Labs  07/07/16 0141 07/07/16 0523  NA 138 141  K 4.8 4.2  CL 104 109  CO2 27 25  GLUCOSE 152* 147*  BUN 45* 38*  CREATININE 2.20* 1.63*  CALCIUM 8.7* 7.4*  PHOS 3.8 3.5   Liver Function Tests:  Recent Labs  07/05/16 0825  07/07/16 0141 07/07/16 0523  AST 29  --   --   --   ALT 18  --   --   --   ALKPHOS 38  --   --   --   BILITOT 1.0  --   --   --   PROT 7.9  --   --   --   ALBUMIN 3.9  < > 3.6 3.1*  < > = values in this interval not displayed. No results for input(s): LIPASE, AMYLASE in the last 72 hours. CBC:  Recent Labs  07/05/16 0825 07/06/16 0236  WBC 9.8 8.1  NEUTROABS 6.7*  --   HGB 15.5 14.3  HCT 45.7 41.7  MCV 100.1* 99.6  PLT 218 203   Cardiac Enzymes:  Recent Labs  07/05/16 1533 07/05/16 2112 07/06/16 0236  TROPONINI 0.11* 0.12* 0.30*   BNP: Invalid input(s): POCBNP D-Dimer: No results for input(s): DDIMER in the last 72 hours. Hemoglobin A1C: No results for input(s): HGBA1C in the last 72 hours. Fasting Lipid Panel: No results for input(s): CHOL, HDL, LDLCALC, TRIG, CHOLHDL, LDLDIRECT in the last 72 hours. Thyroid Function Tests: No results for input(s): TSH, T4TOTAL, T3FREE, THYROIDAB in the last 72 hours.  Invalid input(s): FREET3 Anemia Panel: No results for input(s): VITAMINB12, FOLATE, FERRITIN, TIBC, IRON, RETICCTPCT in the last 72 hours.   PHYSICAL  EXAM General: Well developed, well nourished, in no acute distress HEENT:  Normocephalic and atramatic Neck:  No JVD.  Lungs: Clear bilaterally to auscultation and percussion. Heart: HRRR . Normal S1 and S2 without gallops or murmurs.  Abdomen: Bowel sounds are positive, abdomen soft and non-tender  Msk:  Back normal, normal gait. Normal strength and tone for age. Extremities: No clubbing, cyanosis or edema.   Neuro: Alert and oriented X 3. Psych:  Good affect, responds appropriately  TELEMETRY: Sinus tachycardia  ASSESSMENT AND PLAN: Respiratory failure due to fluid overload secondary to renal failure and COPD. Patient is gradually getting better after getting dialysis.  Active Problems:   Respiratory failure ([place])   Acute renal failure (ARF) (H[facility])   COPD exacerbation ([facility])    Jozsef Wescoat A, MD, [facility] 07/07/2016 8:47 AM

## 2016-07-07 NOTE — Progress Notes (Signed)
Patient became anxious and SOB when turning to use bedpan, bipap replaced and morphine given. Currently still on bipap will reassess and place back on HFNC when patient can tolerate. [name] [name]

## 2016-07-07 NOTE — Progress Notes (Signed)
Blood flow rate decreased d/t increased venous pressures again, will continue to assess. [name] [name]

## 2016-07-07 NOTE — Progress Notes (Signed)
CH responded to a PG for an AD. Pt is alert and awake. Son, daughter, and wife are bedside. [place] educated Pt and family on the AD.  Pt wanted time to consider with his wife before completion. [place] left materials for both.  CH is available to begin completion when the Pt is ready.    07/07/16 1200  Clinical Encounter Type  Visited With Patient;Patient and family together  Visit Type Initial;Spiritual support  Referral From Nurse  Consult/Referral To Chaplain  Spiritual Encounters  Spiritual Needs Literature

## 2016-07-07 NOTE — Progress Notes (Signed)
Patient taken off of bipap and placed on 6L [place]. No longer anxious and able to tolerate being off of bipap, but oxygen saturations dropped to low-mid 80's when talking or eating. Orders received to place patient on HFNC. Patient tolerating HFNC very well so far, will continue to assess. Wilnette Kales

## 2016-07-07 NOTE — Progress Notes (Signed)
CRRT Medication Management:    No further adjustments warranted at this time.    Pharmacy will continue to monitor.   MLS  [phone].

## 2016-07-07 NOTE — Progress Notes (Signed)
Name: [name] [name] MRN:   [id number] DOB:   03-16-1946           DISCUSSION: [name] [name] is a 71 y.o. Male who presented to [facility] ED on 3/14 with c/o of progressive shortness of breath.  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  He is admitted to [facility] ICU for treatment of Acute Hypoxic Hypercapnic Respiratory Failure secondary to to CHF exacerbation, AECOPD, and Acute Kidney Injury-progression/and worsening of  of cardio-renal syndrome  Subjective: Over the last 24 hours, patient has done well. He received CRRT however the filter was clotted. Being managed by nephrology. He is also had complaints of anxiety, congested nares and thin watery diarrhea. Presently on noninvasive ventilation   No current facility-administered medications on file prior to encounter.        Current Outpatient Prescriptions on File Prior to Encounter  Medication Sig  . Ascorbic Acid (VITAMIN C) 1000 MG tablet Take 1,000 mg by mouth daily.  Marland Kitchen aspirin 81 MG chewable tablet Chew 81 mg by mouth daily.  . Cholecalciferol (VITAMIN D-1000 MAX ST) 1000 UNITS tablet Take 2,000 Units by mouth daily.   . Chromium Picolinate 500 MCG CAPS Take 200 mcg by mouth daily.  . citalopram (CELEXA) 20 MG tablet Take 20 mg by mouth daily.  . fenofibrate 160 MG tablet Take 160 mg by mouth daily.  . fexofenadine (ALLEGRA ALLERGY) 180 MG tablet Take 180 mg by mouth daily.  . fluticasone (FLONASE) 50 MCG/ACT nasal spray Place 2 sprays into both nostrils daily as needed for allergies.   . fluticasone-salmeterol (ADVAIR HFA) 115-21 MCG/ACT inhaler Inhale 1 puff into the lungs daily as needed (shortness of breath, wheezing).   . furosemide (LASIX) 20 MG tablet Take 20 mg by mouth daily.  Marland Kitchen gabapentin (NEURONTIN) 100 MG capsule Take 200 mg by mouth 2 (two) times daily.   . Glucosamine-Chondroit-Vit C-Mn (GLUCOSAMINE CHONDROITIN COMPLX) CAPS Take 1 tablet by mouth 2 (two) times daily.  Marland Kitchen  losartan (COZAAR) 25 MG tablet Take 25 mg by mouth daily.  . Magnesium 500 MG CAPS Take 250 mg by mouth daily.   . methocarbamol (ROBAXIN) 750 MG tablet Take 750 mg by mouth 2 (two) times daily.   . metoprolol succinate (TOPROL-XL) 50 MG 24 hr tablet Take 50 mg by mouth daily.  . niacin 500 MG tablet Take 500 mg by mouth daily.  . pantoprazole (PROTONIX) 40 MG tablet Take 40 mg by mouth daily.  . tamsulosin (FLOMAX) 0.4 MG CAPS capsule Take 1 capsule (0.4 mg total) by mouth daily after supper.  . VENTOLIN HFA 108 (90 BASE) MCG/ACT inhaler Inhale 2 puffs into the lungs every 6 (six) hours as needed for wheezing or shortness of breath.   . pravastatin (PRAVACHOL) 20 MG tablet Take 20 mg by mouth daily.  Marland Kitchen testosterone cypionate (DEPOTESTOSTERONE CYPIONATE) 200 MG/ML injection Inject 1 mL (200 mg total) into the muscle every 14 (fourteen) days. (Patient not taking: Reported on 07/05/2016)    VITAL SIGNS: BP 107/82   Pulse (!) 111   Temp 98.1 F (36.7 C) (Axillary)   Resp (!) 28   Ht 5\' 6"  (1.676 m)   Wt 107.8 kg (237 lb 10.5 oz)   SpO2 97%   BMI 38.36 kg/m   HEMODYNAMICS:  VENTILATOR SETTINGS: FiO2 (%):  [35 %-60 %] 45 %  INTAKE / OUTPUT: I/O last 3 completed shifts: In: 15 [P.O.:240; IV Piggyback:350] Out: 400 [Urine:400]  PHYSICAL  EXAMINATION: General:   Obese, white male on BiPAPA, no acute distress  Neuro:  Alert and Oriented, Follows commands, PERRL HEENT:  Atraumatic, Normocephalic, No JVD Cardiovascular:  RRR, S1S2 No R/M/G Lungs:diminished throughout ,expiratory wheezing to bilateral lower lobes and crackles to right lower lobe, symmetrical expansion Abdomen:  Obese, Slightly distended, Soft, Non-tender, BS positive x4 Musculoskeletal:  Normal bulk and tone, 1+ generalized edema, 2+ edema to bilateral lower extremities Skin:  Dry, intact. No rashes, lesions, or ulcerations.  LABS:  BMET  Last Labs    Recent Labs Lab 07/05/16 0825 07/05/16 1813  NA  140  --   K 4.9 4.8  CL 103  --   CO2 28  --   BUN 38*  --   CREATININE 2.28*  --   GLUCOSE 198*  --       Electrolytes  Last Labs    Recent Labs Lab 07/05/16 0825  CALCIUM 9.2      CBC  Last Labs    Recent Labs Lab 07/05/16 0825  WBC 9.8  HGB 15.5  HCT 45.7  PLT 218      Coag's Last Labs   No results for input(s): APTT, INR in the last 168 hours.    Sepsis Markers  Last Labs    Recent Labs Lab 07/05/16 0825  PROCALCITON <0.10      ABG  Last Labs    Recent Labs Lab 07/05/16 0824  PHART 7.35  PCO2ART 49*  PO2ART 78*      Liver Enzymes  Last Labs    Recent Labs Lab 07/05/16 0825  AST 29  ALT 18  ALKPHOS 38  BILITOT 1.0  ALBUMIN 3.9      Cardiac Enzymes  Last Labs    Recent Labs Lab 07/05/16 0825 07/05/16 1533 07/05/16 2112  TROPONINI 0.10* 0.11* 0.12*      Glucose  Last Labs    Recent Labs Lab 07/05/16 1026 07/05/16 1109 07/05/16 1623 07/05/16 1935 07/06/16 0015  GLUCAP 167* 158* 196* 160* 135*      Imaging Dg Chest Port 1 View  Result Date: 07/05/2016 CLINICAL DATA:  Onset of shortness of breath last night made worse today, on CPAP, oxygen saturations 70% on 15 L. Increased work of breathing. History of COPD, CHF, former smoker. EXAM: PORTABLE CHEST 1 VIEW COMPARISON:  Chest x-ray of February 15, 2016 FINDINGS: The lungs are well-expanded. The interstitial markings are increased. The pulmonary vascularity is engorged. The cardiac silhouette is enlarged. There small bilateral pleural effusions. There is no pneumothorax. IMPRESSION: CHF with interstitial edema superimposed upon COPD. No discrete pneumonia. Electronically Signed   By: David  Martinique M.D.   On: 07/05/2016 08:45     STUDIES:  None  CULTURES: 3/14 Blood cultures x2>> 3/14 Influenza PCR>>  ANTIBIOTICS: 3/14 Azithromycin>> 3/14 Ceftriaxone>>3/14  SIGNIFICANT EVENTS: 3/14 Admission to [facility] ICU for Acute  Hypoxic Hypercapnic Respiratory Failure  LINES/TUBES: None   ASSESSMENT / PLAN:  PULMONARY Acute Hypoxic Hypercapnic Respiratory Failure secondary to CHF exacerbation & AECOPD Hx: COPD, 3L Home O2  Maintain O2 saturations >88% Supplemental O2 to maintain O2 saturations Continuous Bipap wean as tolerated Scheduled Duoneb & Budesonide nebs IV Solumedrol, taper Abx as above CXR reveals cardiomegaly with pulmonary edema pattern   CARDIOVASCULAR CHF exacerbation Elevated troponin likely secondary to demand ischemia Hx: CHF, HTN, CAD Continuous Telemetry monitoring Obtain Echocardiogram Trend Troponin's  RENAL Acute Kidney injury,worsening renal function Hx: Kidney stones, Renal disorder Patient status post dialysis catheter and started  on CRRT. Per nephrology. Apparently clotted off last night  GASTROINTESTINAL patient with thin watery diarrhea, we'll send off for C. difficile and start Lomotil  Hermelinda Dellen, DO

## 2016-07-07 NOTE — Progress Notes (Signed)
UF titrated up per order as tolerated by BP. Goal is 100, started at 67. Wilnette Kales

## 2016-07-07 NOTE — Progress Notes (Signed)
Blood flow rate decreased d/t to rising venous pressures. Pressures stabilized after decreasing back to 270. [name] [name]

## 2016-07-07 NOTE — Progress Notes (Addendum)
Spoke with Ms. Bincy Varughese NP about patient's bowel movement which was only clear viscous liquid that definitely came from rectum and was not leakage from foley catheter. Patient also has abdominal distention but no pain currently. NP ordered abdominal x-ray and chest x-ray for patient. Team will continue to monitor.

## 2016-07-07 NOTE — Progress Notes (Signed)
[facility]  ROUNDING NOTE   Subjective:  CRRT circuit clotted earlier this a.m. New tubing being set up. Urine output yesterday was 1.9 L with an additional ultrafiltration of 511 cc.   Objective:  Vital signs in last 24 hours:  Temp:  [96.2 F (35.7 C)-98 F (36.7 C)] 97.7 F (36.5 C) (03/16 0800) Pulse Rate:  [88-112] 104 (03/16 0800) Resp:  [10-28] 19 (03/16 0800) BP: (101-178)/(65-162) 178/162 (03/16 0800) SpO2:  [90 %-100 %] 93 % (03/16 0800) FiO2 (%):  [45 %] 45 % (03/16 0600) Weight:  [107.2 kg (236 lb 5.3 oz)] 107.2 kg (236 lb 5.3 oz) (03/16 0420)  Weight change: 2.873 kg (6 lb 5.3 oz) Filed Weights   07/05/16 1032 07/06/16 0421 07/07/16 0420  Weight: 107.8 kg (237 lb 10.5 oz) 108.1 kg (238 lb 5.1 oz) 107.2 kg (236 lb 5.3 oz)    Intake/Output: I/O last 3 completed shifts: In: 56 [P.O.:360; I.V.:102; IV Piggyback:50] Out: 3089 [Urine:2578; Other:511]   Intake/Output this shift:  No intake/output data recorded.  Physical Exam: General: Critically ill appearing   Head: Normocephalic, atraumatic. Moist oral mucosal membranes  Eyes: Anicteric  Neck: Supple, trachea midline  Lungs:  Rales bilateral, currently on BiPAP   Heart: S1S2 no rubs  Abdomen:  Soft, nontender, bowel sounds present  Extremities: 1+ peripheral edema.  Neurologic: Nonfocal, moving all four extremities  Skin: No lesions       Basic Metabolic Panel:  Recent Labs Lab 07/06/16 1535 07/06/16 1734 07/06/16 2130 07/07/16 0141 07/07/16 0523  NA 140 137 136 138 141  K 3.8 4.6 4.5 4.8 4.2  CL 107 100* 100* 104 109  CO2 28 28 28 27 25   GLUCOSE 96 178* 187* 152* 147*  BUN 15 57* 51* 45* 38*  CREATININE 3.22* 3.03* 2.55* 2.20* 1.63*  CALCIUM 9.5 8.6* 8.7* 8.7* 7.4*  PHOS 2.7 5.0* 4.4 3.8 3.5    Liver Function Tests:  Recent Labs Lab 07/05/16 0825 07/06/16 1535 07/06/16 1734 07/06/16 2130 07/07/16 0141 07/07/16 0523  AST 29  --   --   --   --   --   ALT 18  --    --   --   --   --   ALKPHOS 38  --   --   --   --   --   BILITOT 1.0  --   --   --   --   --   PROT 7.9  --   --   --   --   --   ALBUMIN 3.9 3.6 3.7 3.8 3.6 3.1*   No results for input(s): LIPASE, AMYLASE in the last 168 hours. No results for input(s): AMMONIA in the last 168 hours.  CBC:  Recent Labs Lab 07/05/16 0825 07/06/16 0236  WBC 9.8 8.1  NEUTROABS 6.7*  --   HGB 15.5 14.3  HCT 45.7 41.7  MCV 100.1* 99.6  PLT 218 203    Cardiac Enzymes:  Recent Labs Lab 07/05/16 0825 07/05/16 1533 07/05/16 2112 07/06/16 0236  TROPONINI 0.10* 0.11* 0.12* 0.30*    BNP: Invalid input(s): POCBNP  CBG:  Recent Labs Lab 07/06/16 1223 07/06/16 1721 07/06/16 1953 07/07/16 0356 07/07/16 0730  GLUCAP 155* 171* 201* 147* 163*    Microbiology: Results for orders placed or performed during the hospital encounter of 07/05/16  MRSA PCR Screening     Status: None   Collection Time: 07/05/16 10:29 AM  Result Value Ref Range Status  MRSA by PCR NEGATIVE NEGATIVE Final    Comment:        The GeneXpert MRSA Assay (FDA approved for NASAL specimens only), is one component of a comprehensive MRSA colonization surveillance program. It is not intended to diagnose MRSA infection nor to guide or monitor treatment for MRSA infections.   C difficile quick scan w PCR reflex     Status: None   Collection Time: 07/07/16  7:37 AM  Result Value Ref Range Status   C Diff antigen NEGATIVE NEGATIVE Final   C Diff toxin NEGATIVE NEGATIVE Final   C Diff interpretation No C. difficile detected.  Final    Coagulation Studies: No results for input(s): LABPROT, INR in the last 72 hours.  Urinalysis: No results for input(s): COLORURINE, LABSPEC, PHURINE, GLUCOSEU, HGBUR, BILIRUBINUR, KETONESUR, PROTEINUR, UROBILINOGEN, NITRITE, LEUKOCYTESUR in the last 72 hours.  Invalid input(s): APPERANCEUR    Imaging: US Renal  Result Date: 07/06/2016 CLINICAL DATA:  71 year old male with acute  renal failure. EXAM: RENAL / URINARY TRACT ULTRASOUND COMPLETE COMPARISON:  07/28/2014 and prior CTs FINDINGS: Right Kidney: Length: 11.8 cm. Cortical thinning noted with normal echogenicity. A 1.7 cm upper pole cyst is again noted. There is no evidence of hydronephrosis or solid mass. Left Kidney: Length: 11.1 cm. Cortical thinning noted with normal echogenicity. Left renal cysts are present. There is no evidence of hydronephrosis or solid mass. Bladder: Not well visualized. Incidental note is made of a left pleural effusion - at least small to moderate in size. IMPRESSION: Bilateral renal cortical thinning/ atrophy with normal renal echogenicity. No evidence of hydronephrosis. Bladder not well visualized. Left pleural effusion - at least small to moderate in size. Electronically Signed   By: Margarette Canada M.D.   On: 07/06/2016 15:07   Dg Chest Port 1 View  Result Date: 07/07/2016 CLINICAL DATA:  Respiratory failure . EXAM: PORTABLE CHEST 1 VIEW COMPARISON:  07/06/2016. FINDINGS: Left IJ line in stable position. Cardiomegaly with diffuse bilateral from interstitial prominence. Small bilateral pleural effusions. Findings consistent with CHF. No change from prior exam. IMPRESSION: 1. Left IJ line stable position. 2. Congestive heart failure with bilateral pulmonary interstitial edema and bilateral pleural effusions. No change from prior exam. Electronically Signed   By: Marcello Moores  Register   On: 07/07/2016 06:41   Dg Chest Port 1 View  Result Date: 07/06/2016 CLINICAL DATA:  71 y/o  M; central line placement. EXAM: PORTABLE CHEST 1 VIEW COMPARISON:  07/06/2016 chest radiograph FINDINGS: Stable cardiomegaly. Stable interstitial pulmonary edema. Aortic atherosclerosis with calcification. Degenerative changes of the thoracic spine. Left central venous catheter tip projects over the left lower brachiocephalic vein/upper SVC. Persistent blunting of costal diaphragmatic angles. IMPRESSION: Left central venous catheter  tip projects over the left lower brachiocephalic vein/upper SVC. Stable mild cardiomegaly and interstitial pulmonary edema. Stable trace pleural effusions. Electronically Signed   By: Kristine Garbe M.D.   On: 07/06/2016 17:17   Dg Chest Port 1 View  Result Date: 07/06/2016 CLINICAL DATA:  Interval change, CHF. EXAM: PORTABLE CHEST 1 VIEW COMPARISON:  Chest radiograph July 05, 2016 FINDINGS: Cardiac silhouette is mildly enlarged and unchanged. Calcified aortic knob. Similar interstitial prominence and increased lung volumes. Trace residual pleural effusions, improved. No pneumothorax. Soft tissue planes and included osseous structures are unchanged. IMPRESSION: Cardiomegaly and resolving interstitial edema on a background of COPD.Trace residual pleural effusions, improved. Electronically Signed   By: Elon Alas M.D.   On: 07/06/2016 05:24   Dg Abd Portable 1v  Result Date: 07/07/2016 CLINICAL DATA:  Abdominal distention. EXAM: PORTABLE ABDOMEN - 1 VIEW COMPARISON:  Ultrasound 07/06/2016.  CT 07/25/2012. FINDINGS: Prior abdominal hernia repair. Right femoral vascular catheter noted with its tip projected over the mid pelvis. Distended loops of small and large bowel noted. Findings suggest adynamic ileus. Follow-up exams suggested to demonstrate resolution and to exclude bowel obstruction. No free air. Left nephrolithiasis cannot be excluded. Pelvic calcifications noted, these are most consistent with phleboliths. Cardiomegaly with interstitial prominence bilateral pleural effusions consistent CHF. IMPRESSION: 1. Right femoral venous catheter noted with tip over the mid pelvis. 2. Distended loops of small and large bowel suggesting adynamic ileus. Follow-up exam suggested demonstrate resolution and to exclude bowel obstruction. 3.  Prior abdominal hernia repair. 4. Left nephrolithiasis cannot be excluded. 5. CHF . Electronically Signed   By: Marcello Moores  Register   On: 07/07/2016 06:44      Medications:   . pureflow 2,500 mL/hr at 07/07/16 0039   . aspirin EC  81 mg Oral Daily  . azithromycin  500 mg Oral Q24H  . budesonide (PULMICORT) nebulizer solution  0.5 mg Nebulization BID  . citalopram  20 mg Oral Daily  . famotidine  20 mg Oral QHS  . feeding supplement (ENSURE ENLIVE)  237 mL Oral BID BM  . feeding supplement (GLUCERNA SHAKE)  237 mL Oral TID BM  . fluticasone  2 spray Each Nare Daily  . gabapentin  200 mg Oral BID  . heparin  5,000 Units Subcutaneous Q8H  . insulin aspart  0-9 Units Subcutaneous Q4H  . ipratropium-albuterol  3 mL Nebulization Q4H  . mouth rinse  15 mL Mouth Rinse BID  . methylPREDNISolone (SOLU-MEDROL) injection  40 mg Intravenous Q12H  . sodium chloride flush  10-40 mL Intracatheter Q12H  . sodium chloride flush  3 mL Intravenous Q12H  . sterile water (preservative free)       sodium chloride, sodium chloride, acetaminophen, heparin, LORazepam, morphine injection, ondansetron (ZOFRAN) IV, sodium chloride flush, sodium chloride flush  Assessment/ Plan:  71 y.o. male with diabetes mellitus type 2, hypertension, coronary artery disease, hyperlipidemia, obstructive sleep apnea on CPAP, osteoarthritis, nephrolithiasis, major depression disorder and COPD   1.  Acute renal failure/CKD stage III baseline egfr 38.  Has history of diabetic nephropathy and minimal change disease.  Renal function worse now likely secondary to cardiorenal syndrome.  -Urine output appears to have improved. However patient continues to have significant volume overload. As such we will continue the patient on CRRT at this point in time. We will increase ultrafiltration target to 100 mL per hour.  2. Acute respiratory failure. Secondary to volume overload.  -  Patient remains on BiPAP and continues to have significant shortness of breath. As above we will continue CRRT with ultrafiltration.  3.  Hypotension.  Now resolved. In fact blood pressure appears to be  high.     LOS: 2 Jissell Trafton 3/16/20189:02 AM

## 2016-07-07 NOTE — Progress Notes (Signed)
Patient able to tolerate HFNC to eat dinner, report given to next shift. Patient calm with no complaints at this time. Wilnette Kales

## 2016-07-07 NOTE — Progress Notes (Addendum)
CRRT restarted, will titrate up on blood flow rate as tolerated. VP elevated, will continue to assess. Wilnette Kales

## 2016-07-08 ENCOUNTER — Inpatient Hospital Stay: Payer: PPO

## 2016-07-08 DIAGNOSIS — J9622 Acute and chronic respiratory failure with hypercapnia: Secondary | ICD-10-CM

## 2016-07-08 DIAGNOSIS — J9621 Acute and chronic respiratory failure with hypoxia: Secondary | ICD-10-CM

## 2016-07-08 LAB — CBC
HCT: 38.2 % — ABNORMAL LOW (ref 40.0–52.0)
HEMOGLOBIN: 13.3 g/dL (ref 13.0–18.0)
MCH: 34.4 pg — AB (ref 26.0–34.0)
MCHC: 34.7 g/dL (ref 32.0–36.0)
MCV: 99 fL (ref 80.0–100.0)
Platelets: 126 10*3/uL — ABNORMAL LOW (ref 150–440)
RBC: 3.86 MIL/uL — AB (ref 4.40–5.90)
RDW: 15.2 % — ABNORMAL HIGH (ref 11.5–14.5)
WBC: 5.6 10*3/uL (ref 3.8–10.6)

## 2016-07-08 LAB — RENAL FUNCTION PANEL
ALBUMIN: 3.7 g/dL (ref 3.5–5.0)
ALBUMIN: 3.6 g/dL (ref 3.5–5.0)
ALBUMIN: 3.7 g/dL (ref 3.5–5.0)
ANION GAP: 4 — AB (ref 5–15)
ANION GAP: 5 (ref 5–15)
ANION GAP: 6 (ref 5–15)
ANION GAP: 3 — AB (ref 5–15)
Albumin: 3.6 g/dL (ref 3.5–5.0)
Albumin: 3.5 g/dL (ref 3.5–5.0)
Albumin: 3.6 g/dL (ref 3.5–5.0)
Anion gap: 4 — ABNORMAL LOW (ref 5–15)
Anion gap: 6 (ref 5–15)
BUN: 31 mg/dL — ABNORMAL HIGH (ref 6–20)
BUN: 31 mg/dL — ABNORMAL HIGH (ref 6–20)
BUN: 28 mg/dL — ABNORMAL HIGH (ref 6–20)
BUN: 27 mg/dL — ABNORMAL HIGH (ref 6–20)
BUN: 31 mg/dL — ABNORMAL HIGH (ref 6–20)
BUN: 33 mg/dL — AB (ref 6–20)
CALCIUM: 8.7 mg/dL — AB (ref 8.9–10.3)
CALCIUM: 8.6 mg/dL — AB (ref 8.9–10.3)
CALCIUM: 9.2 mg/dL (ref 8.9–10.3)
CHLORIDE: 106 mmol/L (ref 101–111)
CHLORIDE: 106 mmol/L (ref 101–111)
CHLORIDE: 105 mmol/L (ref 101–111)
CHLORIDE: 104 mmol/L (ref 101–111)
CO2: 29 mmol/L (ref 22–32)
CO2: 28 mmol/L (ref 22–32)
CO2: 28 mmol/L (ref 22–32)
CO2: 28 mmol/L (ref 22–32)
CO2: 30 mmol/L (ref 22–32)
CO2: 28 mmol/L (ref 22–32)
CREATININE: 1.41 mg/dL — AB (ref 0.61–1.24)
CREATININE: 1.48 mg/dL — AB (ref 0.61–1.24)
CREATININE: 1.52 mg/dL — AB (ref 0.61–1.24)
Calcium: 8.9 mg/dL (ref 8.9–10.3)
Calcium: 9.1 mg/dL (ref 8.9–10.3)
Calcium: 9.1 mg/dL (ref 8.9–10.3)
Chloride: 103 mmol/L (ref 101–111)
Chloride: 103 mmol/L (ref 101–111)
Creatinine, Ser: 1.49 mg/dL — ABNORMAL HIGH (ref 0.61–1.24)
Creatinine, Ser: 1.14 mg/dL (ref 0.61–1.24)
Creatinine, Ser: 1.31 mg/dL — ABNORMAL HIGH (ref 0.61–1.24)
GFR calc Af Amer: 52 mL/min — ABNORMAL LOW (ref >60)
GFR calc Af Amer: 51 mL/min — ABNORMAL LOW (ref >60)
GFR calc non Af Amer: 45 mL/min — ABNORMAL LOW (ref >60)
GFR calc non Af Amer: 48 mL/min — ABNORMAL LOW (ref >60)
GFR calc non Af Amer: >60 mL/min (ref >60)
GFR calc non Af Amer: 44 mL/min — ABNORMAL LOW (ref >60)
GFR, EST AFRICAN AMERICAN: 56 mL/min — AB (ref >60)
GFR, EST AFRICAN AMERICAN: 53 mL/min — AB (ref >60)
GFR, EST NON AFRICAN AMERICAN: 53 mL/min — AB (ref >60)
GFR, EST NON AFRICAN AMERICAN: 46 mL/min — AB (ref >60)
GLUCOSE: 155 mg/dL — AB (ref 65–99)
GLUCOSE: 146 mg/dL — AB (ref 65–99)
GLUCOSE: 153 mg/dL — AB (ref 65–99)
Glucose, Bld: 148 mg/dL — ABNORMAL HIGH (ref 65–99)
Glucose, Bld: 185 mg/dL — ABNORMAL HIGH (ref 65–99)
Glucose, Bld: 179 mg/dL — ABNORMAL HIGH (ref 65–99)
PHOSPHORUS: 2.8 mg/dL (ref 2.5–4.6)
PHOSPHORUS: 3.2 mg/dL (ref 2.5–4.6)
PHOSPHORUS: 2.9 mg/dL (ref 2.5–4.6)
Phosphorus: 2.8 mg/dL (ref 2.5–4.6)
Phosphorus: 3 mg/dL (ref 2.5–4.6)
Phosphorus: 2.8 mg/dL (ref 2.5–4.6)
Potassium: 5 mmol/L (ref 3.5–5.1)
Potassium: 5 mmol/L (ref 3.5–5.1)
Potassium: 4.9 mmol/L (ref 3.5–5.1)
Potassium: 4.9 mmol/L (ref 3.5–5.1)
Potassium: 5.4 mmol/L — ABNORMAL HIGH (ref 3.5–5.1)
Potassium: 4.5 mmol/L (ref 3.5–5.1)
SODIUM: 139 mmol/L (ref 135–145)
SODIUM: 137 mmol/L (ref 135–145)
SODIUM: 137 mmol/L (ref 135–145)
Sodium: 139 mmol/L (ref 135–145)
Sodium: 139 mmol/L (ref 135–145)
Sodium: 135 mmol/L (ref 135–145)

## 2016-07-08 LAB — GLUCOSE, CAPILLARY
GLUCOSE-CAPILLARY: 120 mg/dL — AB (ref 65–99)
Glucose-Capillary: 115 mg/dL — ABNORMAL HIGH (ref 65–99)
Glucose-Capillary: 126 mg/dL — ABNORMAL HIGH (ref 65–99)
Glucose-Capillary: 145 mg/dL — ABNORMAL HIGH (ref 65–99)
Glucose-Capillary: 152 mg/dL — ABNORMAL HIGH (ref 65–99)
Glucose-Capillary: 153 mg/dL — ABNORMAL HIGH (ref 65–99)

## 2016-07-08 MED ORDER — IPRATROPIUM-ALBUTEROL 0.5-2.5 (3) MG/3ML IN SOLN
3.0000 mL | Freq: Four times a day (QID) | RESPIRATORY_TRACT | Status: DC
  Administered 2016-07-08 – 2016-07-10 (×10): 3 mL via RESPIRATORY_TRACT
  Filled 2016-07-08 (×10): qty 3

## 2016-07-08 MED ORDER — STERILE WATER FOR INJECTION IJ SOLN
INTRAMUSCULAR | Status: AC
  Administered 2016-07-08: 10 mL
  Filled 2016-07-08: qty 10

## 2016-07-08 MED ORDER — ALTEPLASE 2 MG IJ SOLR
2.0000 mg | Freq: Once | INTRAMUSCULAR | Status: AC
  Administered 2016-07-08: 2 mg
  Filled 2016-07-08 (×2): qty 2

## 2016-07-08 MED ORDER — LORAZEPAM 2 MG/ML IJ SOLN
1.0000 mg | Freq: Once | INTRAMUSCULAR | Status: AC
Start: 2016-07-09 — End: 2016-07-09
  Administered 2016-07-09: 1 mg via INTRAVENOUS
  Filled 2016-07-08: qty 1

## 2016-07-08 NOTE — Progress Notes (Signed)
Pt has remained alert and oriented with no c/o pain. Pt has switched on and off bipap 50%/HFNC 65% throughout the day. Pt will verbalize resp distress on HFNC expressing SOB. Pt has remained with an SpO2 >90% throughout the day. Family has remained at bedside and has been educated about not giving the patient fluids with the bipap mask on. Pt has remained NSR/St in the low 100's on cardiac monitor. CRRT has remained throughout the day with no complications until late afternoon when venous access clotted. Alteplase is currently instilling. Arterial line is heparin locked.

## 2016-07-08 NOTE — Progress Notes (Signed)
Effluent and Balance Chamber Pressures were increasing during last couple hours of shift. Assessment of filter showed clotting beginning. Rinsed back blood with no difficulties and will prime new cartridge. Heparin locked access ports.

## 2016-07-08 NOTE — Progress Notes (Signed)
Name: [name] [name] MRN:   [id number] DOB:   1945/08/15           DISCUSSION: [name] [name] is a 71 y.o. Male who presented to [facility] ED on 3/14 with c/o of progressive shortness of breath.  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  He is admitted to [facility] ICU for treatment of Acute Hypoxic Hypercapnic Respiratory Failure secondary to to CHF exacerbation, AECOPD, and Acute Kidney Injury-progression/and worsening of  of cardio-renal syndrome  Subjective: Over the last 24 hours, patient has done well. He received CRRT. He states improvement in his anxiety with his Xanax. Loose watery stool has diminished. Nasal congestion has also improved. Has continued to use BiPAP overnight pending trial of nasal cannula today.  No current facility-administered medications on file prior to encounter.        Current Outpatient Prescriptions on File Prior to Encounter  Medication Sig  . Ascorbic Acid (VITAMIN C) 1000 MG tablet Take 1,000 mg by mouth daily.  Marland Kitchen aspirin 81 MG chewable tablet Chew 81 mg by mouth daily.  . Cholecalciferol (VITAMIN D-1000 MAX ST) 1000 UNITS tablet Take 2,000 Units by mouth daily.   . Chromium Picolinate 500 MCG CAPS Take 200 mcg by mouth daily.  . citalopram (CELEXA) 20 MG tablet Take 20 mg by mouth daily.  . fenofibrate 160 MG tablet Take 160 mg by mouth daily.  . fexofenadine (ALLEGRA ALLERGY) 180 MG tablet Take 180 mg by mouth daily.  . fluticasone (FLONASE) 50 MCG/ACT nasal spray Place 2 sprays into both nostrils daily as needed for allergies.   . fluticasone-salmeterol (ADVAIR HFA) 115-21 MCG/ACT inhaler Inhale 1 puff into the lungs daily as needed (shortness of breath, wheezing).   . furosemide (LASIX) 20 MG tablet Take 20 mg by mouth daily.  Marland Kitchen gabapentin (NEURONTIN) 100 MG capsule Take 200 mg by mouth 2 (two) times daily.   . Glucosamine-Chondroit-Vit C-Mn (GLUCOSAMINE CHONDROITIN COMPLX) CAPS Take 1 tablet by mouth 2 (two)  times daily.  Marland Kitchen losartan (COZAAR) 25 MG tablet Take 25 mg by mouth daily.  . Magnesium 500 MG CAPS Take 250 mg by mouth daily.   . methocarbamol (ROBAXIN) 750 MG tablet Take 750 mg by mouth 2 (two) times daily.   . metoprolol succinate (TOPROL-XL) 50 MG 24 hr tablet Take 50 mg by mouth daily.  . niacin 500 MG tablet Take 500 mg by mouth daily.  . pantoprazole (PROTONIX) 40 MG tablet Take 40 mg by mouth daily.  . tamsulosin (FLOMAX) 0.4 MG CAPS capsule Take 1 capsule (0.4 mg total) by mouth daily after supper.  . VENTOLIN HFA 108 (90 BASE) MCG/ACT inhaler Inhale 2 puffs into the lungs every 6 (six) hours as needed for wheezing or shortness of breath.   . pravastatin (PRAVACHOL) 20 MG tablet Take 20 mg by mouth daily.  Marland Kitchen testosterone cypionate (DEPOTESTOSTERONE CYPIONATE) 200 MG/ML injection Inject 1 mL (200 mg total) into the muscle every 14 (fourteen) days. (Patient not taking: Reported on 07/05/2016)    VITAL SIGNS: BP 107/82   Pulse (!) 111   Temp 98.1 F (36.7 C) (Axillary)   Resp (!) 28   Ht 5\' 6"  (1.676 m)   Wt 107.8 kg (237 lb 10.5 oz)   SpO2 97%   BMI 38.36 kg/m    FiO2 (%):  [35 %-60 %] 45 %  INTAKE / OUTPUT: I/O last 3 completed shifts: In: 59 [P.O.:240; IV Piggyback:350] Out: 400 [Urine:400]  PHYSICAL EXAMINATION: General:   Obese, white male on BiPAPA, no acute distress  Neuro:  Alert and Oriented, Follows commands, PERRL HEENT:  Atraumatic, Normocephalic, No JVD Cardiovascular:  RRR, S1S2 No R/M/G Lungs:diminished throughout ,expiratory wheezing to bilateral lower lobes and crackles to right lower lobe, symmetrical expansion Abdomen:  Obese, Slightly distended, Soft, Non-tender, BS positive x4 Musculoskeletal:  Normal bulk and tone, 1+ generalized edema, 2+ edema to bilateral lower extremities Skin:  Dry, intact. No rashes, lesions, or ulcerations.  LABS:  BMET  Last Labs    Recent Labs Lab 07/05/16 0825 07/05/16 1813  NA 140  --   K 4.9 4.8   CL 103  --   CO2 28  --   BUN 38*  --   CREATININE 2.28*  --   GLUCOSE 198*  --       Electrolytes  Last Labs    Recent Labs Lab 07/05/16 0825  CALCIUM 9.2      CBC  Last Labs    Recent Labs Lab 07/05/16 0825  WBC 9.8  HGB 15.5  HCT 45.7  PLT 218      Coag's Last Labs   No results for input(s): APTT, INR in the last 168 hours.    Sepsis Markers  Last Labs    Recent Labs Lab 07/05/16 0825  PROCALCITON <0.10      ABG  Last Labs    Recent Labs Lab 07/05/16 0824  PHART 7.35  PCO2ART 49*  PO2ART 78*      Liver Enzymes  Last Labs    Recent Labs Lab 07/05/16 0825  AST 29  ALT 18  ALKPHOS 38  BILITOT 1.0  ALBUMIN 3.9      Cardiac Enzymes  Last Labs    Recent Labs Lab 07/05/16 0825 07/05/16 1533 07/05/16 2112  TROPONINI 0.10* 0.11* 0.12*      Glucose  Last Labs    Recent Labs Lab 07/05/16 1026 07/05/16 1109 07/05/16 1623 07/05/16 1935 07/06/16 0015  GLUCAP 167* 158* 196* 160* 135*      Imaging Dg Chest Port 1 View  Result Date: 07/05/2016 CLINICAL DATA:  Onset of shortness of breath last night made worse today, on CPAP, oxygen saturations 70% on 15 L. Increased work of breathing. History of COPD, CHF, former smoker. EXAM: PORTABLE CHEST 1 VIEW COMPARISON:  Chest x-ray of February 15, 2016 FINDINGS: The lungs are well-expanded. The interstitial markings are increased. The pulmonary vascularity is engorged. The cardiac silhouette is enlarged. There small bilateral pleural effusions. There is no pneumothorax. IMPRESSION: CHF with interstitial edema superimposed upon COPD. No discrete pneumonia. Electronically Signed   By: David  Martinique M.D.   On: 07/05/2016 08:45     STUDIES:  None  CULTURES: 3/14 Blood cultures x2>> 3/14 Influenza PCR>>  ANTIBIOTICS: 3/14 Azithromycin>> 3/14 Ceftriaxone>>3/14  SIGNIFICANT EVENTS: 3/14 Admission to [facility] ICU for Acute Hypoxic Hypercapnic  Respiratory Failure  LINES/TUBES: None   ASSESSMENT / PLAN:  PULMONARY Acute Hypoxic Hypercapnic Respiratory Failure secondary to CHF exacerbation & AECOPD Hx: COPD, 3L Home O2  Patient's respiratory status has improved, will attempt to give him time off of BiPAP and change him to nasal cannula versus UTI flow. He will continue on his albuterol, Atrovent, budesonide, Solu-Medrol and antibiotics  CARDIOVASCULAR CHF exacerbation Elevated troponin likely secondary to demand ischemia Hx: CHF, HTN, CAD Continuous Telemetry monitoring Obtain Echocardiogram Trend Troponin's  RENAL Acute Kidney injury Presently on CRRT BUN/creatinine 31/1.41, potassium 5.0 with a bicarbonate of 28  GASTROINTESTINAL Stool studies were negative for C. difficile and diarrhea has improved  Hermelinda Dellen, DO

## 2016-07-08 NOTE — Progress Notes (Signed)
Patient restless in bed and asking for something to help him sleep stating that the ativan helped but not for long.  RN made Patria Mane, NP aware of all mentioned above and gave order for 1 mg IV ativan once.

## 2016-07-08 NOTE — Progress Notes (Signed)
CRRT Medication Management:    No further adjustments warranted at this time.    Pharmacy will continue to monitor.    Chinita Greenland PharmD Clinical Pharmacist 07/08/2016

## 2016-07-08 NOTE — Progress Notes (Signed)
Patient asked to be placed back on bipap at 19:30 after he finished dinner and shortly after 08:00 became anxious and requested prn ativan. Patient's anxiety relieved after prn ativan dose and patient then rested well overnight. Vital signs stable on bipap. CRRT filter began to clot around 00:00 so cartridge replaced (CRRT back in operation at 01:00).

## 2016-07-08 NOTE — Progress Notes (Signed)
SUBJECTIVE: Pt reports he is anxious with mild chest pain. He reports he has tolerated HFNC intermittently but has required return to BiPAP.    Vitals:   07/08/16 0400 07/08/16 0412 07/08/16 0500 07/08/16 0600  BP: 135/78  120/72 117/69  Pulse: 97  90 90  Resp: 13  14 14   Temp: 97.1 F (36.2 C)     TempSrc: Axillary     SpO2: 94%  93% 93%  Weight:  229 lb 8 oz (104.1 kg)    Height:        Intake/Output Summary (Last 24 hours) at 07/08/16 0706 Last data filed at 07/08/16 0600  Gross per 24 hour  Intake              237 ml  Output             2492 ml  Net            -2255 ml    LABS: Basic Metabolic Panel:  Recent Labs  07/07/16 1740  07/08/16 0147 07/08/16 0533  NA 138  < > 139 139  K 4.8  < > 5.0 5.0  CL 104  < > 106 106  CO2 28  < > 29 28  GLUCOSE 185*  < > 148* 155*  BUN 34*  < > 31* 31*  CREATININE 1.42*  < > 1.49* 1.41*  CALCIUM 9.0  < > 8.7* 8.6*  MG 2.2  --   --   --   PHOS 3.0  3.1  < > 2.8 3.0  < > = values in this interval not displayed. Liver Function Tests:  Recent Labs  07/05/16 0825  07/08/16 0147 07/08/16 0533  AST 29  --   --   --   ALT 18  --   --   --   ALKPHOS 38  --   --   --   BILITOT 1.0  --   --   --   PROT 7.9  --   --   --   ALBUMIN 3.9  < > 3.6 3.5  < > = values in this interval not displayed. No results for input(s): LIPASE, AMYLASE in the last 72 hours. CBC:  Recent Labs  07/05/16 0825 07/06/16 0236 07/08/16 0533  WBC 9.8 8.1 5.6  NEUTROABS 6.7*  --   --   HGB 15.5 14.3 13.3  HCT 45.7 41.7 38.2*  MCV 100.1* 99.6 99.0  PLT 218 203 126*   Cardiac Enzymes:  Recent Labs  07/05/16 1533 07/05/16 2112 07/06/16 0236  TROPONINI 0.11* 0.12* 0.30*   BNP: Invalid input(s): POCBNP D-Dimer: No results for input(s): DDIMER in the last 72 hours. Hemoglobin A1C: No results for input(s): HGBA1C in the last 72 hours. Fasting Lipid Panel: No results for input(s): CHOL, HDL, LDLCALC, TRIG, CHOLHDL, LDLDIRECT in the last 72  hours. Thyroid Function Tests: No results for input(s): TSH, T4TOTAL, T3FREE, THYROIDAB in the last 72 hours.  Invalid input(s): FREET3 Anemia Panel: No results for input(s): VITAMINB12, FOLATE, FERRITIN, TIBC, IRON, RETICCTPCT in the last 72 hours.   PHYSICAL EXAM General: Lying in bed, appears anxious and slightly agitated.  HEENT:  Normocephalic and atramatic Neck:  No JVD.  Lungs: Clear bilaterally to auscultation and percussion. Heart: HRRR . Normal S1 and S2 without gallops or murmurs.  Abdomen: Bowel sounds are positive, abdomen soft and non-tender  Extremities: No clubbing, cyanosis or edema.   Neuro: Alert and oriented X 3. Psych:  Appears anxious,  however responds to questions appropriately  TELEMETRY: Sinus tachycarida  ASSESSMENT AND PLAN: Respiratory failure due to fluid volume overload secondary to renal failure and COPD. 7 beat run of Vtach occurred last night, electrolytes are WNL. Continue to treat volume overload with CRRT and respiratory failure. Monitor for further arrhythmias.  Active Problems:   Respiratory failure ([place])   Acute renal failure (ARF) ([facility])   COPD exacerbation ([place])    Jake Bathe, NP-C 07/08/2016 7:06 AM

## 2016-07-08 NOTE — Progress Notes (Signed)
[facility]  ROUNDING NOTE   Subjective:  Respiratory status appears to be slightly improved. Urine output was 629 cc an ultrafiltration over the preceding 24 hours was 1.9 L. Patient was net -2.3 L yesterday. Remains on BiPAP, however hopefully this can be weaned today.   Objective:  Vital signs in last 24 hours:  Temp:  [96 F (35.6 C)-97.1 F (36.2 C)] 96.6 F (35.9 C) (03/17 0800) Pulse Rate:  [83-120] 99 (03/17 0900) Resp:  [11-34] 16 (03/17 0900) BP: (106-145)/(66-91) 122/72 (03/17 0900) SpO2:  [88 %-99 %] 95 % (03/17 0900) FiO2 (%):  [45 %-65 %] 45 % (03/17 0818) Weight:  [104.1 kg (229 lb 8 oz)] 104.1 kg (229 lb 8 oz) (03/17 0412)  Weight change: -3.1 kg (-6 lb 13.4 oz) Filed Weights   07/06/16 0421 07/07/16 0420 07/08/16 0412  Weight: 108.1 kg (238 lb 5.1 oz) 107.2 kg (236 lb 5.3 oz) 104.1 kg (229 lb 8 oz)    Intake/Output: I/O last 3 completed shifts: In: 782 [P.O.:477] Out: 4235 [TIRWE:3154; Other:2279]   Intake/Output this shift:  No intake/output data recorded.  Physical Exam: General: Critically ill appearing   Head: Normocephalic, atraumatic. Moist oral mucosal membranes  Eyes: Anicteric  Neck: Supple, trachea midline  Lungs:  Basilar rales, currently on BiPAP   Heart: S1S2 no rubs  Abdomen:  Soft, nontender, bowel sounds present  Extremities: 1+ peripheral edema.  Neurologic: Nonfocal, moving all four extremities  Skin: No lesions       Basic Metabolic Panel:  Recent Labs Lab 07/07/16 1418 07/07/16 1740 07/07/16 2202 07/08/16 0147 07/08/16 0533  NA 138 138 138 139 139  K 4.9 4.8 4.7 5.0 5.0  CL 103 104 104 106 106  CO2 30 28 29 29 28   GLUCOSE 164* 185* 177* 148* 155*  BUN 38* 34* 33* 31* 31*  CREATININE 1.80* 1.42* 1.57* 1.49* 1.41*  CALCIUM 8.7* 9.0 8.8* 8.7* 8.6*  MG  --  2.2  --   --   --   PHOS 3.3 3.0  3.1 2.8 2.8 3.0    Liver Function Tests:  Recent Labs Lab 07/05/16 0825  07/07/16 1418 07/07/16 1740  07/07/16 2202 07/08/16 0147 07/08/16 0533  AST 29  --   --   --   --   --   --   ALT 18  --   --   --   --   --   --   ALKPHOS 38  --   --   --   --   --   --   BILITOT 1.0  --   --   --   --   --   --   PROT 7.9  --   --   --   --   --   --   ALBUMIN 3.9  < > 3.7 3.8 3.6 3.6 3.5  < > = values in this interval not displayed. No results for input(s): LIPASE, AMYLASE in the last 168 hours. No results for input(s): AMMONIA in the last 168 hours.  CBC:  Recent Labs Lab 07/05/16 0825 07/06/16 0236 07/08/16 0533  WBC 9.8 8.1 5.6  NEUTROABS 6.7*  --   --   HGB 15.5 14.3 13.3  HCT 45.7 41.7 38.2*  MCV 100.1* 99.6 99.0  PLT 218 203 126*    Cardiac Enzymes:  Recent Labs Lab 07/05/16 0825 07/05/16 1533 07/05/16 2112 07/06/16 0236  TROPONINI 0.10* 0.11* 0.12* 0.30*  BNP: Invalid input(s): POCBNP  CBG:  Recent Labs Lab 07/07/16 1545 07/07/16 1926 07/07/16 2354 07/08/16 0410 07/08/16 0739  GLUCAP 147* 105* 145* 115* 153*    Microbiology: Results for orders placed or performed during the hospital encounter of 07/05/16  MRSA PCR Screening     Status: None   Collection Time: 07/05/16 10:29 AM  Result Value Ref Range Status   MRSA by PCR NEGATIVE NEGATIVE Final    Comment:        The GeneXpert MRSA Assay (FDA approved for NASAL specimens only), is one component of a comprehensive MRSA colonization surveillance program. It is not intended to diagnose MRSA infection nor to guide or monitor treatment for MRSA infections.   C difficile quick scan w PCR reflex     Status: None   Collection Time: 07/07/16  7:37 AM  Result Value Ref Range Status   C Diff antigen NEGATIVE NEGATIVE Final   C Diff toxin NEGATIVE NEGATIVE Final   C Diff interpretation No C. difficile detected.  Final    Coagulation Studies: No results for input(s): LABPROT, INR in the last 72 hours.  Urinalysis: No results for input(s): COLORURINE, LABSPEC, PHURINE, GLUCOSEU, HGBUR,  BILIRUBINUR, KETONESUR, PROTEINUR, UROBILINOGEN, NITRITE, LEUKOCYTESUR in the last 72 hours.  Invalid input(s): APPERANCEUR    Imaging: US Renal  Result Date: 07/06/2016 CLINICAL DATA:  71 year old male with acute renal failure. EXAM: RENAL / URINARY TRACT ULTRASOUND COMPLETE COMPARISON:  07/28/2014 and prior CTs FINDINGS: Right Kidney: Length: 11.8 cm. Cortical thinning noted with normal echogenicity. A 1.7 cm upper pole cyst is again noted. There is no evidence of hydronephrosis or solid mass. Left Kidney: Length: 11.1 cm. Cortical thinning noted with normal echogenicity. Left renal cysts are present. There is no evidence of hydronephrosis or solid mass. Bladder: Not well visualized. Incidental note is made of a left pleural effusion - at least small to moderate in size. IMPRESSION: Bilateral renal cortical thinning/ atrophy with normal renal echogenicity. No evidence of hydronephrosis. Bladder not well visualized. Left pleural effusion - at least small to moderate in size. Electronically Signed   By: Margarette Canada M.D.   On: 07/06/2016 15:07   Dg Chest Port 1 View  Result Date: 07/08/2016 CLINICAL DATA:  Respiratory failure EXAM: PORTABLE CHEST 1 VIEW COMPARISON:  Chest radiograph 07/07/2016 caps FINDINGS: Left IJ central venous catheter is in unchanged position. There is persistent cardiomegaly with calcific aortic arch atherosclerosis. There is unchanged bilateral pulmonary edema and small bilateral pleural effusions. Left basilar consolidation is unchanged. IMPRESSION: Unchanged findings of congestive heart failure. Electronically Signed   By: Ulyses Jarred M.D.   On: 07/08/2016 06:49   Dg Chest Port 1 View  Result Date: 07/07/2016 CLINICAL DATA:  Respiratory failure . EXAM: PORTABLE CHEST 1 VIEW COMPARISON:  07/06/2016. FINDINGS: Left IJ line in stable position. Cardiomegaly with diffuse bilateral from interstitial prominence. Small bilateral pleural effusions. Findings consistent with CHF. No  change from prior exam. IMPRESSION: 1. Left IJ line stable position. 2. Congestive heart failure with bilateral pulmonary interstitial edema and bilateral pleural effusions. No change from prior exam. Electronically Signed   By: Marcello Moores  Register   On: 07/07/2016 06:41   Dg Chest Port 1 View  Result Date: 07/06/2016 CLINICAL DATA:  71 y/o  M; central line placement. EXAM: PORTABLE CHEST 1 VIEW COMPARISON:  07/06/2016 chest radiograph FINDINGS: Stable cardiomegaly. Stable interstitial pulmonary edema. Aortic atherosclerosis with calcification. Degenerative changes of the thoracic spine. Left central venous catheter tip projects  over the left lower brachiocephalic vein/upper SVC. Persistent blunting of costal diaphragmatic angles. IMPRESSION: Left central venous catheter tip projects over the left lower brachiocephalic vein/upper SVC. Stable mild cardiomegaly and interstitial pulmonary edema. Stable trace pleural effusions. Electronically Signed   By: Kristine Garbe M.D.   On: 07/06/2016 17:17   Dg Abd Portable 1v  Result Date: 07/07/2016 CLINICAL DATA:  Abdominal distention. EXAM: PORTABLE ABDOMEN - 1 VIEW COMPARISON:  Ultrasound 07/06/2016.  CT 07/25/2012. FINDINGS: Prior abdominal hernia repair. Right femoral vascular catheter noted with its tip projected over the mid pelvis. Distended loops of small and large bowel noted. Findings suggest adynamic ileus. Follow-up exams suggested to demonstrate resolution and to exclude bowel obstruction. No free air. Left nephrolithiasis cannot be excluded. Pelvic calcifications noted, these are most consistent with phleboliths. Cardiomegaly with interstitial prominence bilateral pleural effusions consistent CHF. IMPRESSION: 1. Right femoral venous catheter noted with tip over the mid pelvis. 2. Distended loops of small and large bowel suggesting adynamic ileus. Follow-up exam suggested demonstrate resolution and to exclude bowel obstruction. 3.  Prior abdominal  hernia repair. 4. Left nephrolithiasis cannot be excluded. 5. CHF . Electronically Signed   By: Marcello Moores  Register   On: 07/07/2016 06:44     Medications:   . pureflow 2,500 mL/hr at 07/08/16 0650   . aspirin EC  81 mg Oral Daily  . azithromycin  500 mg Oral Q24H  . budesonide (PULMICORT) nebulizer solution  0.5 mg Nebulization BID  . citalopram  20 mg Oral Daily  . famotidine  20 mg Oral QHS  . feeding supplement (ENSURE ENLIVE)  237 mL Oral BID BM  . feeding supplement (GLUCERNA SHAKE)  237 mL Oral TID BM  . fluticasone  2 spray Each Nare Daily  . gabapentin  200 mg Oral BID  . heparin  5,000 Units Subcutaneous Q8H  . insulin aspart  0-9 Units Subcutaneous Q4H  . ipratropium-albuterol  3 mL Nebulization Q4H  . mouth rinse  15 mL Mouth Rinse BID  . methylPREDNISolone (SOLU-MEDROL) injection  40 mg Intravenous Q12H  . metoCLOPramide (REGLAN) injection  5 mg Intravenous Q8H  . sodium chloride flush  10-40 mL Intracatheter Q12H  . sodium chloride flush  3 mL Intravenous Q12H  . tamsulosin  0.4 mg Oral Daily   sodium chloride, sodium chloride, acetaminophen, heparin, LORazepam, morphine injection, ondansetron (ZOFRAN) IV, sodium chloride, sodium chloride flush, sodium chloride flush  Assessment/ Plan:  71 y.o. male with diabetes mellitus type 2, hypertension, coronary artery disease, hyperlipidemia, obstructive sleep apnea on CPAP, osteoarthritis, nephrolithiasis, major depression disorder and COPD   1.  Acute renal failure/CKD stage III baseline egfr 38.  Has history of diabetic nephropathy and minimal change disease.  Renal function worse now likely secondary to cardiorenal syndrome.  -Good volume output noted yesterday. Patient was net -2.3 L. We will continue the patient on CRRT for now. If he is able to, for BiPAP today we will consider stopping CRRT tomorrow. Continue to monitor serum electrolytes for now.  2. Acute respiratory failure. Secondary to volume overload.  -  Patient  still on BiPAP however it appears that his respiratory status has improved a bit. Hopefully he can be weaned from BiPAP later today. For now continue ultrafiltration to help wean the patient from BiPAP.  3.  Hypotension.  Resolved.     LOS: 3 [name] [name] 3/17/201810:11 AM

## 2016-07-09 LAB — RENAL FUNCTION PANEL
ALBUMIN: 3.7 g/dL (ref 3.5–5.0)
ANION GAP: 3 — AB (ref 5–15)
ANION GAP: 3 — AB (ref 5–15)
ANION GAP: 4 — AB (ref 5–15)
Albumin: 3.5 g/dL (ref 3.5–5.0)
Albumin: 3.2 g/dL — ABNORMAL LOW (ref 3.5–5.0)
Albumin: 3.5 g/dL (ref 3.5–5.0)
Albumin: 3.8 g/dL (ref 3.5–5.0)
Anion gap: 5 (ref 5–15)
Anion gap: 3 — ABNORMAL LOW (ref 5–15)
BUN: 30 mg/dL — AB (ref 6–20)
BUN: 28 mg/dL — ABNORMAL HIGH (ref 6–20)
BUN: 27 mg/dL — AB (ref 6–20)
BUN: 29 mg/dL — AB (ref 6–20)
BUN: 31 mg/dL — ABNORMAL HIGH (ref 6–20)
CALCIUM: 8.4 mg/dL — AB (ref 8.9–10.3)
CALCIUM: 8.7 mg/dL — AB (ref 8.9–10.3)
CHLORIDE: 105 mmol/L (ref 101–111)
CHLORIDE: 105 mmol/L (ref 101–111)
CHLORIDE: 102 mmol/L (ref 101–111)
CHLORIDE: 102 mmol/L (ref 101–111)
CO2: 29 mmol/L (ref 22–32)
CO2: 28 mmol/L (ref 22–32)
CO2: 27 mmol/L (ref 22–32)
CO2: 28 mmol/L (ref 22–32)
CO2: 28 mmol/L (ref 22–32)
CREATININE: 1.31 mg/dL — AB (ref 0.61–1.24)
CREATININE: 1.26 mg/dL — AB (ref 0.61–1.24)
CREATININE: 1.38 mg/dL — AB (ref 0.61–1.24)
Calcium: 8.8 mg/dL — ABNORMAL LOW (ref 8.9–10.3)
Calcium: 9.1 mg/dL (ref 8.9–10.3)
Calcium: 9.3 mg/dL (ref 8.9–10.3)
Chloride: 106 mmol/L (ref 101–111)
Creatinine, Ser: 1.36 mg/dL — ABNORMAL HIGH (ref 0.61–1.24)
Creatinine, Ser: 1.3 mg/dL — ABNORMAL HIGH (ref 0.61–1.24)
GFR calc Af Amer: 58 mL/min — ABNORMAL LOW (ref >60)
GFR calc Af Amer: >60 mL/min (ref >60)
GFR calc non Af Amer: 50 mL/min — ABNORMAL LOW (ref >60)
GFR calc non Af Amer: 55 mL/min — ABNORMAL LOW (ref >60)
GFR calc non Af Amer: 53 mL/min — ABNORMAL LOW (ref >60)
GFR, EST AFRICAN AMERICAN: 57 mL/min — AB (ref >60)
GFR, EST NON AFRICAN AMERICAN: 53 mL/min — AB (ref >60)
GFR, EST NON AFRICAN AMERICAN: 50 mL/min — AB (ref >60)
GLUCOSE: 150 mg/dL — AB (ref 65–99)
GLUCOSE: 160 mg/dL — AB (ref 65–99)
GLUCOSE: 220 mg/dL — AB (ref 65–99)
Glucose, Bld: 147 mg/dL — ABNORMAL HIGH (ref 65–99)
Glucose, Bld: 172 mg/dL — ABNORMAL HIGH (ref 65–99)
PHOSPHORUS: 3 mg/dL (ref 2.5–4.6)
POTASSIUM: 4.8 mmol/L (ref 3.5–5.1)
POTASSIUM: 5.2 mmol/L — AB (ref 3.5–5.1)
Phosphorus: 3.1 mg/dL (ref 2.5–4.6)
Phosphorus: 3.1 mg/dL (ref 2.5–4.6)
Phosphorus: 3.4 mg/dL (ref 2.5–4.6)
Phosphorus: 3.1 mg/dL (ref 2.5–4.6)
Potassium: 4.7 mmol/L (ref 3.5–5.1)
Potassium: 4.5 mmol/L (ref 3.5–5.1)
Potassium: 5 mmol/L (ref 3.5–5.1)
SODIUM: 137 mmol/L (ref 135–145)
SODIUM: 137 mmol/L (ref 135–145)
Sodium: 137 mmol/L (ref 135–145)
Sodium: 133 mmol/L — ABNORMAL LOW (ref 135–145)
Sodium: 134 mmol/L — ABNORMAL LOW (ref 135–145)

## 2016-07-09 LAB — GLUCOSE, CAPILLARY
GLUCOSE-CAPILLARY: 117 mg/dL — AB (ref 65–99)
GLUCOSE-CAPILLARY: 155 mg/dL — AB (ref 65–99)
Glucose-Capillary: 156 mg/dL — ABNORMAL HIGH (ref 65–99)
Glucose-Capillary: 135 mg/dL — ABNORMAL HIGH (ref 65–99)
Glucose-Capillary: 148 mg/dL — ABNORMAL HIGH (ref 65–99)
Glucose-Capillary: 145 mg/dL — ABNORMAL HIGH (ref 65–99)

## 2016-07-09 LAB — PHOSPHORUS: PHOSPHORUS: 3.2 mg/dL (ref 2.5–4.6)

## 2016-07-09 LAB — MAGNESIUM: MAGNESIUM: 2 mg/dL (ref 1.7–2.4)

## 2016-07-09 NOTE — Progress Notes (Signed)
Name: [name] [name] MRN:   [id number] DOB:   07-30-1945           DISCUSSION: [name] [name] is a 71 y.o. Male who presented to [facility] ED on 3/14 with c/o of progressive shortness of breath.  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  He is admitted to [facility] ICU for treatment of Acute Hypoxic Hypercapnic Respiratory Failure secondary to to CHF exacerbation, AECOPD, and Acute Kidney Injury-progression/and worsening of  of cardio-renal syndrome  Subjective: Over the last 24 hours, patient has done well. He received CRRT. He was able to have some time off  No curren BiPAP and is feeling better. We will try to advance time off of BiPAP today.       Current Outpatient Prescriptions on File Prior to Encounter  Medication Sig  . Ascorbic Acid (VITAMIN C) 1000 MG tablet Take 1,000 mg by mouth daily.  Marland Kitchen aspirin 81 MG chewable tablet Chew 81 mg by mouth daily.  . Cholecalciferol (VITAMIN D-1000 MAX ST) 1000 UNITS tablet Take 2,000 Units by mouth daily.   . Chromium Picolinate 500 MCG CAPS Take 200 mcg by mouth daily.  . citalopram (CELEXA) 20 MG tablet Take 20 mg by mouth daily.  . fenofibrate 160 MG tablet Take 160 mg by mouth daily.  . fexofenadine (ALLEGRA ALLERGY) 180 MG tablet Take 180 mg by mouth daily.  . fluticasone (FLONASE) 50 MCG/ACT nasal spray Place 2 sprays into both nostrils daily as needed for allergies.   . fluticasone-salmeterol (ADVAIR HFA) 115-21 MCG/ACT inhaler Inhale 1 puff into the lungs daily as needed (shortness of breath, wheezing).   . furosemide (LASIX) 20 MG tablet Take 20 mg by mouth daily.  Marland Kitchen gabapentin (NEURONTIN) 100 MG capsule Take 200 mg by mouth 2 (two) times daily.   . Glucosamine-Chondroit-Vit C-Mn (GLUCOSAMINE CHONDROITIN COMPLX) CAPS Take 1 tablet by mouth 2 (two) times daily.  Marland Kitchen losartan (COZAAR) 25 MG tablet Take 25 mg by mouth daily.  . Magnesium 500 MG CAPS Take 250 mg by mouth daily.   . methocarbamol  (ROBAXIN) 750 MG tablet Take 750 mg by mouth 2 (two) times daily.   . metoprolol succinate (TOPROL-XL) 50 MG 24 hr tablet Take 50 mg by mouth daily.  . niacin 500 MG tablet Take 500 mg by mouth daily.  . pantoprazole (PROTONIX) 40 MG tablet Take 40 mg by mouth daily.  . tamsulosin (FLOMAX) 0.4 MG CAPS capsule Take 1 capsule (0.4 mg total) by mouth daily after supper.  . VENTOLIN HFA 108 (90 BASE) MCG/ACT inhaler Inhale 2 puffs into the lungs every 6 (six) hours as needed for wheezing or shortness of breath.   . pravastatin (PRAVACHOL) 20 MG tablet Take 20 mg by mouth daily.  Marland Kitchen testosterone cypionate (DEPOTESTOSTERONE CYPIONATE) 200 MG/ML injection Inject 1 mL (200 mg total) into the muscle every 14 (fourteen) days. (Patient not taking: Reported on 07/05/2016)    VITAL SIGNS: BP 107/82   Pulse (!) 111   Temp 98.1 F (36.7 C) (Axillary)   Resp (!) 28   Ht 5\' 6"  (1.676 m)   Wt 107.8 kg (237 lb 10.5 oz)   SpO2 97%   BMI 38.36 kg/m    FiO2 (%):  [35 %-60 %] 45 %  INTAKE / OUTPUT: I/O last 3 completed shifts: In: 28 [P.O.:240; IV Piggyback:350] Out: 400 [Urine:400]  PHYSICAL EXAMINATION: General:   Obese, white male on BiPAPA, no acute distress  Neuro:  Alert and Oriented, Follows commands, PERRL HEENT:  Atraumatic, Normocephalic, No JVD Cardiovascular:  RRR, S1S2 No R/M/G Lungs:diminished throughout ,expiratory wheezing to bilateral lower lobes and crackles to right lower lobe, symmetrical expansion Abdomen:  Obese, Slightly distended, Soft, Non-tender, BS positive x4 Musculoskeletal:  Normal bulk and tone, 1+ generalized edema, 2+ edema to bilateral lower extremities Skin:  Dry, intact. No rashes, lesions, or ulcerations.  LABS:  BMET  Last Labs    Recent Labs Lab 07/05/16 0825 07/05/16 1813  NA 140  --   K 4.9 4.8  CL 103  --   CO2 28  --   BUN 38*  --   CREATININE 2.28*  --   GLUCOSE 198*  --       Electrolytes  Last Labs    Recent Labs Lab  07/05/16 0825  CALCIUM 9.2      CBC  Last Labs    Recent Labs Lab 07/05/16 0825  WBC 9.8  HGB 15.5  HCT 45.7  PLT 218      Coag's Last Labs   No results for input(s): APTT, INR in the last 168 hours.    Sepsis Markers  Last Labs    Recent Labs Lab 07/05/16 0825  PROCALCITON <0.10      ABG  Last Labs    Recent Labs Lab 07/05/16 0824  PHART 7.35  PCO2ART 49*  PO2ART 78*      Liver Enzymes  Last Labs    Recent Labs Lab 07/05/16 0825  AST 29  ALT 18  ALKPHOS 38  BILITOT 1.0  ALBUMIN 3.9      Cardiac Enzymes  Last Labs    Recent Labs Lab 07/05/16 0825 07/05/16 1533 07/05/16 2112  TROPONINI 0.10* 0.11* 0.12*      Glucose  Last Labs    Recent Labs Lab 07/05/16 1026 07/05/16 1109 07/05/16 1623 07/05/16 1935 07/06/16 0015  GLUCAP 167* 158* 196* 160* 135*      Imaging Dg Chest Port 1 View  Result Date: 07/05/2016 CLINICAL DATA:  Onset of shortness of breath last night made worse today, on CPAP, oxygen saturations 70% on 15 L. Increased work of breathing. History of COPD, CHF, former smoker. EXAM: PORTABLE CHEST 1 VIEW COMPARISON:  Chest x-ray of February 15, 2016 FINDINGS: The lungs are well-expanded. The interstitial markings are increased. The pulmonary vascularity is engorged. The cardiac silhouette is enlarged. There small bilateral pleural effusions. There is no pneumothorax. IMPRESSION: CHF with interstitial edema superimposed upon COPD. No discrete pneumonia. Electronically Signed   By: David  Martinique M.D.   On: 07/05/2016 08:45     STUDIES:  None  CULTURES: 3/14 Blood cultures x2>> 3/14 Influenza PCR>>  ANTIBIOTICS: 3/14 Azithromycin>> 3/14 Ceftriaxone>>3/14  SIGNIFICANT EVENTS: 3/14 Admission to [facility] ICU for Acute Hypoxic Hypercapnic Respiratory Failure  LINES/TUBES: None   ASSESSMENT / PLAN:  PULMONARY Acute Hypoxic Hypercapnic Respiratory Failure secondary to CHF  exacerbation & AECOPD Hx: COPD, 3L Home O2  Patient's respiratory status has improved, will attempt to give him time off of BiPAP and change him to nasal cannula. He will continue on his albuterol, Atrovent, budesonide, Solu-Medrol and antibiotics  CARDIOVASCULAR CHF exacerbation Elevated troponin likely secondary to demand ischemia Hx: CHF, HTN, CAD Continuous Telemetry monitoring Obtain Echocardiogram Trend Troponin's  RENAL Acute Kidney injury Presently on CRRT BUN/creatinine 31/1.41, potassium 5.0 with a bicarbonate of 28  GASTROINTESTINAL Stool studies were negative for C. difficile and diarrhea has improved  Hermelinda Dellen, DO

## 2016-07-09 NOTE — Progress Notes (Signed)
[facility]  ROUNDING NOTE   Subjective:  Patient has done well with continuous renal replacement therapy. Patient was net -1.9 L yesterday. Appears to be making some urine and urine output was 600 cc. Currently off of BiPAP and on high flow nasal cannula.   Objective:  Vital signs in last 24 hours:  Temp:  [97.6 F (36.4 C)-98.6 F (37 C)] 98 F (36.7 C) (03/18 0800) Pulse Rate:  [80-116] 112 (03/18 0809) Resp:  [15-30] 19 (03/18 0809) BP: (106-157)/(64-102) 148/83 (03/18 0800) SpO2:  [91 %-99 %] 95 % (03/18 0812) FiO2 (%):  [40 %-51 %] 51 % (03/18 0812) Weight:  [104.6 kg (230 lb 9.6 oz)] 104.6 kg (230 lb 9.6 oz) (03/18 0327)  Weight change: 0.5 kg (1 lb 1.6 oz) Filed Weights   07/07/16 0420 07/08/16 0412 07/09/16 0327  Weight: 107.2 kg (236 lb 5.3 oz) 104.1 kg (229 lb 8 oz) 104.6 kg (230 lb 9.6 oz)    Intake/Output: I/O last 3 completed shifts: In: 489 [P.O.:480; I.V.:9] Out: 6861 [UOHFG:902; Other:2839]   Intake/Output this shift:  Total I/O In: 390 [P.O.:360; I.V.:30] Out: -   Physical Exam: General: No acute distress  Head: Normocephalic, atraumatic. Moist oral mucosal membranes, high flow [place] on  Eyes: Anicteric  Neck: Supple, trachea midline  Lungs:  Diminished rales, now transitioned to high flow [place]  Heart: S1S2 no rubs  Abdomen:  Soft, nontender, bowel sounds present  Extremities: 1+ peripheral edema.  Neurologic: Nonfocal, moving all four extremities  Skin: No lesions  Access: Right femoral temporary dialysis catheter    Basic Metabolic Panel:  Recent Labs Lab 07/07/16 1740  07/08/16 1822 07/08/16 2153 07/09/16 0139 07/09/16 0550 07/09/16 0930  NA 138  < > 137 137 137 137 137  K 4.8  < > 5.4* 4.5 4.8 4.7 4.5  CL 104  < > 103 103 105 106 105  CO2 28  < > 30 28 29 28 27   GLUCOSE 185*  < > 179* 153* 150* 147* 160*  BUN 34*  < > 31* 33* 30* 28* 27*  CREATININE 1.42*  < > 1.48* 1.52* 1.36* 1.31* 1.26*  CALCIUM 9.0  < > 9.2 9.1  8.8* 8.4* 8.7*  MG 2.2  --   --   --   --  2.0  --   PHOS 3.0  3.1  < > 3.2 2.9 3.1 3.1  3.2 3.4  < > = values in this interval not displayed.  Liver Function Tests:  Recent Labs Lab 07/05/16 0825  07/08/16 1822 07/08/16 2153 07/09/16 0139 07/09/16 0550 07/09/16 0930  AST 29  --   --   --   --   --   --   ALT 18  --   --   --   --   --   --   ALKPHOS 38  --   --   --   --   --   --   BILITOT 1.0  --   --   --   --   --   --   PROT 7.9  --   --   --   --   --   --   ALBUMIN 3.9  < > 3.6 3.7 3.5 3.2* 3.5  < > = values in this interval not displayed. No results for input(s): LIPASE, AMYLASE in the last 168 hours. No results for input(s): AMMONIA in the last 168 hours.  CBC:  Recent Labs  Lab 07/05/16 0825 07/06/16 0236 07/08/16 0533  WBC 9.8 8.1 5.6  NEUTROABS 6.7*  --   --   HGB 15.5 14.3 13.3  HCT 45.7 41.7 38.2*  MCV 100.1* 99.6 99.0  PLT 218 203 126*    Cardiac Enzymes:  Recent Labs Lab 07/05/16 0825 07/05/16 1533 07/05/16 2112 07/06/16 0236  TROPONINI 0.10* 0.11* 0.12* 0.30*    BNP: Invalid input(s): POCBNP  CBG:  Recent Labs Lab 07/08/16 1142 07/08/16 2129 07/08/16 2357 07/09/16 0358 07/09/16 0713  GLUCAP 120* 152* 126* 156* 135*    Microbiology: Results for orders placed or performed during the hospital encounter of 07/05/16  MRSA PCR Screening     Status: None   Collection Time: 07/05/16 10:29 AM  Result Value Ref Range Status   MRSA by PCR NEGATIVE NEGATIVE Final    Comment:        The GeneXpert MRSA Assay (FDA approved for NASAL specimens only), is one component of a comprehensive MRSA colonization surveillance program. It is not intended to diagnose MRSA infection nor to guide or monitor treatment for MRSA infections.   C difficile quick scan w PCR reflex     Status: None   Collection Time: 07/07/16  7:37 AM  Result Value Ref Range Status   C Diff antigen NEGATIVE NEGATIVE Final   C Diff toxin NEGATIVE NEGATIVE Final   C  Diff interpretation No C. difficile detected.  Final    Coagulation Studies: No results for input(s): LABPROT, INR in the last 72 hours.  Urinalysis: No results for input(s): COLORURINE, LABSPEC, PHURINE, GLUCOSEU, HGBUR, BILIRUBINUR, KETONESUR, PROTEINUR, UROBILINOGEN, NITRITE, LEUKOCYTESUR in the last 72 hours.  Invalid input(s): APPERANCEUR    Imaging: Dg Chest Port 1 View  Result Date: 07/08/2016 CLINICAL DATA:  Respiratory failure EXAM: PORTABLE CHEST 1 VIEW COMPARISON:  Chest radiograph 07/07/2016 caps FINDINGS: Left IJ central venous catheter is in unchanged position. There is persistent cardiomegaly with calcific aortic arch atherosclerosis. There is unchanged bilateral pulmonary edema and small bilateral pleural effusions. Left basilar consolidation is unchanged. IMPRESSION: Unchanged findings of congestive heart failure. Electronically Signed   By: Ulyses Jarred M.D.   On: 07/08/2016 06:49     Medications:   . pureflow 3 each (07/09/16 0960)   . aspirin EC  81 mg Oral Daily  . azithromycin  500 mg Oral Q24H  . budesonide (PULMICORT) nebulizer solution  0.5 mg Nebulization BID  . citalopram  20 mg Oral Daily  . famotidine  20 mg Oral QHS  . feeding supplement (GLUCERNA SHAKE)  237 mL Oral TID BM  . fluticasone  2 spray Each Nare Daily  . gabapentin  200 mg Oral BID  . heparin  5,000 Units Subcutaneous Q8H  . insulin aspart  0-9 Units Subcutaneous Q4H  . ipratropium-albuterol  3 mL Nebulization Q6H  . mouth rinse  15 mL Mouth Rinse BID  . methylPREDNISolone (SOLU-MEDROL) injection  40 mg Intravenous Q12H  . metoCLOPramide (REGLAN) injection  5 mg Intravenous Q8H  . sodium chloride flush  10-40 mL Intracatheter Q12H  . sodium chloride flush  3 mL Intravenous Q12H  . tamsulosin  0.4 mg Oral Daily   sodium chloride, sodium chloride, acetaminophen, heparin, LORazepam, morphine injection, ondansetron (ZOFRAN) IV, sodium chloride, sodium chloride flush, sodium chloride  flush  Assessment/ Plan:  71 y.o. male with diabetes mellitus type 2, hypertension, coronary artery disease, hyperlipidemia, obstructive sleep apnea on CPAP, osteoarthritis, nephrolithiasis, major depression disorder and COPD   1.  Acute  renal failure/CKD stage III baseline egfr 38.  Has history of diabetic nephropathy and minimal change disease.  Renal function worse now likely secondary to cardiorenal syndrome.  - Patient is making some urine. Urine output was 600 cc over the preceding 24 hours. He has done well with continuous renal placement therapy and ultrafiltration. Patient was net -1.9 L yesterday. We will continue CRRT until later this evening and then take him off. We may need to consider additional intermittent dialysis going forward for volume control.  2. Acute respiratory failure. Secondary to volume overload.  -  Currently improved. The patient is on high flow nasal cannula. Periodically he has needed BiPAP.  3.  Hypotension.  Resolved.     LOS: 4 [name] [name] 3/18/201811:20 AM

## 2016-07-09 NOTE — Progress Notes (Signed)
CRRT discontinued at 1830.

## 2016-07-09 NOTE — Progress Notes (Signed)
SUBJECTIVE: Pt is feeling better today.    Vitals:   07/09/16 0327 07/09/16 0400 07/09/16 0500 07/09/16 0607  BP:  122/72 132/77 139/85  Pulse:  86 83 (!) 103  Resp:  15 15 (!) 23  Temp:  97.9 F (36.6 C)    TempSrc:  Axillary    SpO2:  94% 94% 95%  Weight: 230 lb 9.6 oz (104.6 kg)     Height:        Intake/Output Summary (Last 24 hours) at 07/09/16 0733 Last data filed at 07/09/16 0600  Gross per 24 hour  Intake              489 ml  Output             2381 ml  Net            -1892 ml    LABS: Basic Metabolic Panel:  Recent Labs  07/07/16 1740  07/09/16 0139 07/09/16 0550  NA 138  < > 137 137  K 4.8  < > 4.8 4.7  CL 104  < > 105 106  CO2 28  < > 29 28  GLUCOSE 185*  < > 150* 147*  BUN 34*  < > 30* 28*  CREATININE 1.42*  < > 1.36* 1.31*  CALCIUM 9.0  < > 8.8* 8.4*  MG 2.2  --   --  2.0  PHOS 3.0  3.1  < > 3.1 3.1  3.2  < > = values in this interval not displayed. Liver Function Tests:  Recent Labs  07/09/16 0139 07/09/16 0550  ALBUMIN 3.5 3.2*   No results for input(s): LIPASE, AMYLASE in the last 72 hours. CBC:  Recent Labs  07/08/16 0533  WBC 5.6  HGB 13.3  HCT 38.2*  MCV 99.0  PLT 126*   Cardiac Enzymes: No results for input(s): CKTOTAL, CKMB, CKMBINDEX, TROPONINI in the last 72 hours. BNP: Invalid input(s): POCBNP D-Dimer: No results for input(s): DDIMER in the last 72 hours. Hemoglobin A1C: No results for input(s): HGBA1C in the last 72 hours. Fasting Lipid Panel: No results for input(s): CHOL, HDL, LDLCALC, TRIG, CHOLHDL, LDLDIRECT in the last 72 hours. Thyroid Function Tests: No results for input(s): TSH, T4TOTAL, T3FREE, THYROIDAB in the last 72 hours.  Invalid input(s): FREET3 Anemia Panel: No results for input(s): VITAMINB12, FOLATE, FERRITIN, TIBC, IRON, RETICCTPCT in the last 72 hours.   PHYSICAL EXAM General: Sitting up in bed eating a meal on HFNC. HEENT:  Normocephalic and atramatic Neck:  No JVD.  Lungs:  Congested. Heart: HRRR . Normal S1 and S2 without gallops or murmurs.  Abdomen: Bowel sounds are positive, abdomen soft and non-tender  Extremities: No clubbing, cyanosis or edema.   Neuro: Alert and oriented X 3. Psych:  Good affect, responds appropriately  TELEMETRY: Sinus tachycardia, 110 bpm. ST depression noted monitor.  ASSESSMENT AND PLAN: Respiratory failure due to fluid volume overload secondary to renal failure and COPD. No further arrhythmias reported,  electrolytes are WNL. Continue to treat volume overload with CRRT and respiratory failure. Monitor for further arrhythmias.   Echo: 07/06/16: EF of 40% with severe aortic stenosis.   Active Problems:   Respiratory failure ([place])   Acute renal failure (ARF) ([facility])   COPD exacerbation ([place])    Jake Bathe, NP-C 07/09/2016 7:33 AM

## 2016-07-09 NOTE — Progress Notes (Signed)
Rested well since around 0200 after giving ativan.  Awake now watching tv.  Used bipap all night.  Denies pain.  VSS.  375 cc UOP for the last 12 hours.  CRRT has ran well since declotting catheter at shift change last night.

## 2016-07-09 NOTE — Progress Notes (Signed)
Pt has remained alert and oriented with no c/o pain. NSR/ST in low 100's on cardiac monitor. Pt transitioned from bipap to HFNC 50% early this am-pt has remained on HFNC since - SpO2 > 90%. CRRT has remained with no complications. Pt was able to sit up on side of bed for lunch-no distress noted. Pt got up to [facility] and was given a bath with no distress noted.

## 2016-07-10 DIAGNOSIS — N179 Acute kidney failure, unspecified: Secondary | ICD-10-CM

## 2016-07-10 LAB — CBC
HCT: 39.6 % — ABNORMAL LOW (ref 40.0–52.0)
HEMOGLOBIN: 13.8 g/dL (ref 13.0–18.0)
MCH: 34.2 pg — ABNORMAL HIGH (ref 26.0–34.0)
MCHC: 34.8 g/dL (ref 32.0–36.0)
MCV: 98.2 fL (ref 80.0–100.0)
Platelets: 110 10*3/uL — ABNORMAL LOW (ref 150–440)
RBC: 4.04 MIL/uL — ABNORMAL LOW (ref 4.40–5.90)
RDW: 14.5 % (ref 11.5–14.5)
WBC: 8.8 10*3/uL (ref 3.8–10.6)

## 2016-07-10 LAB — GLUCOSE, CAPILLARY
GLUCOSE-CAPILLARY: 169 mg/dL — AB (ref 65–99)
GLUCOSE-CAPILLARY: 161 mg/dL — AB (ref 65–99)
GLUCOSE-CAPILLARY: 138 mg/dL — AB (ref 65–99)
GLUCOSE-CAPILLARY: 227 mg/dL — AB (ref 65–99)
GLUCOSE-CAPILLARY: 245 mg/dL — AB (ref 65–99)
Glucose-Capillary: 181 mg/dL — ABNORMAL HIGH (ref 65–99)
Glucose-Capillary: 153 mg/dL — ABNORMAL HIGH (ref 65–99)

## 2016-07-10 LAB — MAGNESIUM: Magnesium: 2.4 mg/dL (ref 1.7–2.4)

## 2016-07-10 LAB — BASIC METABOLIC PANEL
ANION GAP: 4 — AB (ref 5–15)
BUN: 47 mg/dL — ABNORMAL HIGH (ref 6–20)
CALCIUM: 9.1 mg/dL (ref 8.9–10.3)
CHLORIDE: 101 mmol/L (ref 101–111)
CO2: 26 mmol/L (ref 22–32)
Creatinine, Ser: 1.8 mg/dL — ABNORMAL HIGH (ref 0.61–1.24)
GFR calc non Af Amer: 36 mL/min — ABNORMAL LOW (ref >60)
GFR, EST AFRICAN AMERICAN: 42 mL/min — AB (ref >60)
GLUCOSE: 182 mg/dL — AB (ref 65–99)
Potassium: 5.4 mmol/L — ABNORMAL HIGH (ref 3.5–5.1)
Sodium: 131 mmol/L — ABNORMAL LOW (ref 135–145)

## 2016-07-10 LAB — PHOSPHORUS: PHOSPHORUS: 4.3 mg/dL (ref 2.5–4.6)

## 2016-07-10 MED ORDER — FUROSEMIDE 40 MG PO TABS
40.0000 mg | ORAL_TABLET | Freq: Two times a day (BID) | ORAL | Status: DC
  Administered 2016-07-10 (×2): 40 mg via ORAL
  Filled 2016-07-10 (×2): qty 1

## 2016-07-10 NOTE — Progress Notes (Signed)
[facility]  ROUNDING NOTE   Subjective:  Wife at bedside.   Off CRRT.   Continues to complain of shortness of breath and orthopnea.   Objective:  Vital signs in last 24 hours:  Temp:  [97.5 F (36.4 C)-98.6 F (37 C)] 97.5 F (36.4 C) (03/19 1242) Pulse Rate:  [75-109] 104 (03/19 1242) Resp:  [15-29] 19 (03/19 1242) BP: (104-143)/(57-131) 118/57 (03/19 1200) SpO2:  [84 %-98 %] 91 % (03/19 1242) FiO2 (%):  [35 %-50 %] 35 % (03/19 0830) Weight:  [102.6 kg (226 lb 3.1 oz)] 102.6 kg (226 lb 3.1 oz) (03/19 0455)  Weight change: -2 kg (-4 lb 6.6 oz) Filed Weights   07/08/16 0412 07/09/16 0327 07/10/16 0455  Weight: 104.1 kg (229 lb 8 oz) 104.6 kg (230 lb 9.6 oz) 102.6 kg (226 lb 3.1 oz)    Intake/Output: I/O last 3 completed shifts: In: 14 [P.O.:1000; I.V.:30] Out: 3149 [Urine:1200; FWYOV:7858]   Intake/Output this shift:  Total I/O In: 240 [P.O.:240] Out: 250 [Urine:250]  Physical Exam: General: No acute distress  Head: Normocephalic, atraumatic. Moist oral mucosal membranes, high flow  on  Eyes: Anicteric  Neck: Supple, trachea midline  Lungs:  Bilateral rales  Heart: S1S2 no rubs  Abdomen:  Soft, nontender, bowel sounds present  Extremities: 1+ peripheral edema.  Neurologic: Nonfocal, moving all four extremities  Skin: No lesions  Access: Right femoral temporary dialysis catheter    Basic Metabolic Panel:  Recent Labs Lab 07/07/16 1740  07/09/16 0550 07/09/16 0930 07/09/16 1425 07/09/16 1751 07/10/16 0435  NA 138  < > 137 137 133* 134* 131*  K 4.8  < > 4.7 4.5 5.0 5.2* 5.4*  CL 104  < > 106 105 102 102 101  CO2 28  < > 28 27 28 28 26   GLUCOSE 185*  < > 147* 160* 220* 172* 182*  BUN 34*  < > 28* 27* 29* 31* 47*  CREATININE 1.42*  < > 1.31* 1.26* 1.30* 1.38* 1.80*  CALCIUM 9.0  < > 8.4* 8.7* 9.1 9.3 9.1  MG 2.2  --  2.0  --   --   --  2.4  PHOS 3.0  3.1  < > 3.1  3.2 3.4 3.0 3.1 4.3  < > = values in this interval not  displayed.  Liver Function Tests:  Recent Labs Lab 07/05/16 0825  07/09/16 0139 07/09/16 0550 07/09/16 0930 07/09/16 1425 07/09/16 1751  AST 29  --   --   --   --   --   --   ALT 18  --   --   --   --   --   --   ALKPHOS 38  --   --   --   --   --   --   BILITOT 1.0  --   --   --   --   --   --   PROT 7.9  --   --   --   --   --   --   ALBUMIN 3.9  < > 3.5 3.2* 3.5 3.7 3.8  < > = values in this interval not displayed. No results for input(s): LIPASE, AMYLASE in the last 168 hours. No results for input(s): AMMONIA in the last 168 hours.  CBC:  Recent Labs Lab 07/05/16 0825 07/06/16 0236 07/08/16 0533 07/10/16 0435  WBC 9.8 8.1 5.6 8.8  NEUTROABS 6.7*  --   --   --  HGB 15.5 14.3 13.3 13.8  HCT 45.7 41.7 38.2* 39.6*  MCV 100.1* 99.6 99.0 98.2  PLT 218 203 126* 110*    Cardiac Enzymes:  Recent Labs Lab 07/05/16 0825 07/05/16 1533 07/05/16 2112 07/06/16 0236  TROPONINI 0.10* 0.11* 0.12* 0.30*    BNP: Invalid input(s): POCBNP  CBG:  Recent Labs Lab 07/09/16 1935 07/09/16 2356 07/10/16 0348 07/10/16 0723 07/10/16 1124  GLUCAP 145* 117* 169* 161* 138*    Microbiology: Results for orders placed or performed during the hospital encounter of 07/05/16  MRSA PCR Screening     Status: None   Collection Time: 07/05/16 10:29 AM  Result Value Ref Range Status   MRSA by PCR NEGATIVE NEGATIVE Final    Comment:        The GeneXpert MRSA Assay (FDA approved for NASAL specimens only), is one component of a comprehensive MRSA colonization surveillance program. It is not intended to diagnose MRSA infection nor to guide or monitor treatment for MRSA infections.   C difficile quick scan w PCR reflex     Status: None   Collection Time: 07/07/16  7:37 AM  Result Value Ref Range Status   C Diff antigen NEGATIVE NEGATIVE Final   C Diff toxin NEGATIVE NEGATIVE Final   C Diff interpretation No C. difficile detected.  Final    Coagulation Studies: No results  for input(s): LABPROT, INR in the last 72 hours.  Urinalysis: No results for input(s): COLORURINE, LABSPEC, PHURINE, GLUCOSEU, HGBUR, BILIRUBINUR, KETONESUR, PROTEINUR, UROBILINOGEN, NITRITE, LEUKOCYTESUR in the last 72 hours.  Invalid input(s): APPERANCEUR    Imaging: No results found.   Medications:    . aspirin EC  81 mg Oral Daily  . budesonide (PULMICORT) nebulizer solution  0.5 mg Nebulization BID  . citalopram  20 mg Oral Daily  . famotidine  20 mg Oral QHS  . feeding supplement (GLUCERNA SHAKE)  237 mL Oral TID BM  . fluticasone  2 spray Each Nare Daily  . furosemide  40 mg Oral BID  . gabapentin  200 mg Oral BID  . heparin  5,000 Units Subcutaneous Q8H  . insulin aspart  0-9 Units Subcutaneous Q4H  . ipratropium-albuterol  3 mL Nebulization Q6H  . mouth rinse  15 mL Mouth Rinse BID  . methylPREDNISolone (SOLU-MEDROL) injection  40 mg Intravenous Q12H  . metoCLOPramide (REGLAN) injection  5 mg Intravenous Q8H  . sodium chloride flush  10-40 mL Intracatheter Q12H  . sodium chloride flush  3 mL Intravenous Q12H  . tamsulosin  0.4 mg Oral Daily   sodium chloride, sodium chloride, acetaminophen, heparin, LORazepam, morphine injection, ondansetron (ZOFRAN) IV, sodium chloride, sodium chloride flush, sodium chloride flush  Assessment/ Plan:  71 y.o. male with diabetes mellitus type 2, hypertension, coronary artery disease, hyperlipidemia, obstructive sleep apnea on CPAP, osteoarthritis, nephrolithiasis, major depression disorder and COPD   1.  Acute renal failure with hyperkalemia on Chronic Kidney Disease stage III with proteinuria: Baseline creatinine of stage III baseline 1.75, GFR of 38 from 02/21/16.  Chronic Kidney Disease secondary to diabetic nephropathy, biopsy proven.  Acute renal failure secondary to cardiorenal syndrome. Requiring renal replacement therapy.  Continue to monitor volume status, renal function and urine output.  - Restart furosemide 40mg  bid.  PO  2. Acute respiratory failure. Secondary to volume overload, COPD and congestive heart failure.  -  O2 high flow nasal cannula. Periodically he has needed BiPAP.   LOS: [place], [place] 3/19/20182:34 PM

## 2016-07-10 NOTE — Telephone Encounter (Signed)
Spoke with pt in reference to testosterone results. Pt voiced understanding. Pt stated that he is currently in the hospital and not taking testosterone injections until he is d/c.

## 2016-07-10 NOTE — Progress Notes (Signed)
71 y/o male with aortic stensos, CHFE45%admitted to ICU with  resp failure and needed CCRT initally . Will take over in am

## 2016-07-10 NOTE — Progress Notes (Signed)
SUBJECTIVE: Patient is feeling much better   Vitals:   07/10/16 0600 07/10/16 0700 07/10/16 0800 07/10/16 0814  BP: 104/63 110/73 104/83   Pulse: 81 82 90 75  Resp: 16 15 20 15   Temp:    98.4 F (36.9 C)  TempSrc:    Oral  SpO2: 93% 92% 96% 93%  Weight:      Height:        Intake/Output Summary (Last 24 hours) at 07/10/16 0854 Last data filed at 07/10/16 0500  Gross per 24 hour  Intake              430 ml  Output             1779 ml  Net            -1349 ml    LABS: Basic Metabolic Panel:  Recent Labs  07/09/16 0550  07/09/16 1751 07/10/16 0435  NA 137  < > 134* 131*  K 4.7  < > 5.2* 5.4*  CL 106  < > 102 101  CO2 28  < > 28 26  GLUCOSE 147*  < > 172* 182*  BUN 28*  < > 31* 47*  CREATININE 1.31*  < > 1.38* 1.80*  CALCIUM 8.4*  < > 9.3 9.1  MG 2.0  --   --  2.4  PHOS 3.1  3.2  < > 3.1 4.3  < > = values in this interval not displayed. Liver Function Tests:  Recent Labs  07/09/16 1425 07/09/16 1751  ALBUMIN 3.7 3.8   No results for input(s): LIPASE, AMYLASE in the last 72 hours. CBC:  Recent Labs  07/08/16 0533 07/10/16 0435  WBC 5.6 8.8  HGB 13.3 13.8  HCT 38.2* 39.6*  MCV 99.0 98.2  PLT 126* 110*   Cardiac Enzymes: No results for input(s): CKTOTAL, CKMB, CKMBINDEX, TROPONINI in the last 72 hours. BNP: Invalid input(s): POCBNP D-Dimer: No results for input(s): DDIMER in the last 72 hours. Hemoglobin A1C: No results for input(s): HGBA1C in the last 72 hours. Fasting Lipid Panel: No results for input(s): CHOL, HDL, LDLCALC, TRIG, CHOLHDL, LDLDIRECT in the last 72 hours. Thyroid Function Tests: No results for input(s): TSH, T4TOTAL, T3FREE, THYROIDAB in the last 72 hours.  Invalid input(s): FREET3 Anemia Panel: No results for input(s): VITAMINB12, FOLATE, FERRITIN, TIBC, IRON, RETICCTPCT in the last 72 hours.   PHYSICAL EXAM General: Well developed, well nourished, in no acute distress HEENT:  Normocephalic and atramatic Neck:  No JVD.   Lungs: Clear bilaterally to auscultation and percussion. Heart: HRRR . Normal S1 and S2 without gallops or murmurs.  Abdomen: Bowel sounds are positive, abdomen soft and non-tender  Msk:  Back normal, normal gait. Normal strength and tone for age. Extremities: No clubbing, cyanosis or edema.   Neuro: Alert and oriented X 3. Psych:  Good affect, responds appropriately  TELEMETRY:Sinus rhythm  ASSESSMENT AND PLAN: Severe aortic stenosis with moderate LV dysfunction left ventricle ejection fraction approximately 45%. Discussed with the family option of aortic valve replacement and once patient is clinically stable we will reevaluate if he is a candidate for aortic valve replacement with his current status of respiratory as well as renal compromise.  Active Problems:   Respiratory failure ([place])   Acute renal failure (ARF) (HCC)   COPD exacerbation (HCC)    Cobie Leidner A, MD, [facility] 07/10/2016 8:54 AM

## 2016-07-10 NOTE — Progress Notes (Signed)
[facility] Progess Note    ASSESSMENT/PLAN     PULMONARY Acute Hypoxic Hypercapnic Respiratory Failure secondary to CHF exacerbation & AECOPD OSA.  Hx: COPD, 3L Home O2 Currently weaned to hi flow nasal cannula, will wean down as tolerated.  He will continue on his albuterol, Atrovent, budesonide, and antibiotics.  --Solumedrol 40 q 12.   CARDIOVASCULAR A: Severe AS; EF=45%; AECHF.  P:  Continue lasix. Nephro following.   RENAL A:  AKI.  P:   Continue lasix.   GASTROINTESTINAL A:  --  HEMATOLOGIC A:  -- P:   INFECTIOUS A:  -- P:    Micro/culture results:  BCx2 -- UC -- Sputum-- MRSA PCR 07/05/16 negative.   Antibiotics:   ENDOCRINE A:  Glucose controlled.   P:   Continue SSI.   NEUROLOGIC A:-- --   MAJOR EVENTS/TEST RESULTS:   Best Practices  DVT Prophylaxis: SQ heparin.  GI Prophylaxis: --   ---------------------------------------   ----------------------------------------   Name: [name] [name] MRN: [id number] DOB: Apr 14, 1946    ADMISSION DATE:  07/05/2016     SUBJECTIVE:  No new complaints overnight.   Review of Systems:  Constitutional: Feels well. Cardiovascular: No chest pain.  Pulmonary: Denies dyspnea.   The remainder of systems were reviewed and were found to be negative other than what is documented in the HPI.    VITAL SIGNS: Temp:  [98.2 F (36.8 C)-98.6 F (37 C)] 98.4 F (36.9 C) (03/19 0814) Pulse Rate:  [75-109] 105 (03/19 0900) Resp:  [15-29] 19 (03/19 0900) BP: (104-143)/(63-131) 143/131 (03/19 0900) SpO2:  [84 %-99 %] 92 % (03/19 1131) FiO2 (%):  [35 %-51 %] 35 % (03/19 0830) Weight:  [226 lb 3.1 oz (102.6 kg)] 226 lb 3.1 oz (102.6 kg) (03/19 0455) HEMODYNAMICS:   VENTILATOR SETTINGS: FiO2 (%):  [35 %-51 %] 35 % INTAKE / OUTPUT:  Intake/Output Summary (Last 24 hours) at 07/10/16 1212 Last data filed at 07/10/16 1044  Gross per 24 hour  Intake               440 ml  Output             1640 ml  Net            -1200 ml    PHYSICAL EXAMINATION: Physical Examination:   VS: BP (!) 143/131   Pulse (!) 105   Temp 98.4 F (36.9 C) (Oral)   Resp 19   Ht 5\' 6"  (1.676 m)   Wt 226 lb 3.1 oz (102.6 kg)   SpO2 92%   BMI 36.51 kg/m   General Appearance: No distress  Neuro:without focal findings, mental status normal. HEENT: PERRLA, EOM intact. Pulmonary: normal breath sounds   CardiovascularNormal S1,S2.  No m/r/g.   Abdomen: Benign, Soft, non-tender. Renal:  No costovertebral tenderness  GU:  Not performed at this time. Endocrine: No evident thyromegaly. Skin:   warm, no rashes, no ecchymosis  Extremities: normal, no cyanosis, clubbing.   LABS:   LABORATORY PANEL:   CBC  Recent Labs Lab 07/10/16 0435  WBC 8.8  HGB 13.8  HCT 39.6*  PLT 110*    Chemistries   Recent Labs Lab 07/05/16 0825  07/10/16 0435  NA 140  < > 131*  K 4.9  < > 5.4*  CL 103  < > 101  CO2 28  < > 26  GLUCOSE 198*  < > 182*  BUN 38*  < > 47*  CREATININE 2.28*  < >  1.80*  CALCIUM 9.2  < > 9.1  MG  --   < > 2.4  PHOS  --   < > 4.3  AST 29  --   --   ALT 18  --   --   ALKPHOS 38  --   --   BILITOT 1.0  --   --   < > = values in this interval not displayed.   Recent Labs Lab 07/09/16 1554 07/09/16 1935 07/09/16 2356 07/10/16 0348 07/10/16 0723 07/10/16 1124  GLUCAP 148* 145* 117* 169* 161* 138*    Recent Labs Lab 07/05/16 0824 07/06/16 0431  PHART 7.35 7.35  PCO2ART 49* 48  PO2ART 78* 89    Recent Labs Lab 07/05/16 0825  07/09/16 0930 07/09/16 1425 07/09/16 1751  AST 29  --   --   --   --   ALT 18  --   --   --   --   ALKPHOS 38  --   --   --   --   BILITOT 1.0  --   --   --   --   ALBUMIN 3.9  < > 3.5 3.7 3.8  < > = values in this interval not displayed.  Cardiac Enzymes  Recent Labs Lab 07/06/16 0236  TROPONINI 0.30*    RADIOLOGY:  No results found.     Marda Stalker, MD.  ICU Pager:  [phone] [facility] Office Number: [phone]   07/10/2016

## 2016-07-10 NOTE — Progress Notes (Signed)
While rounding, [place] responded to a request to complete an AD. [place] secured a notary and two witnesses. CH made a copy of the AD and placed in the Pt chart. [place] returned original to Pt.  Wife decided to have her AD completed at a later time. CH provided the ministry of prayer. CH is available for follow up as needed.    07/10/16 1300  Clinical Encounter Type  Visited With Patient;Patient and family together  Visit Type Follow-up;Critical Care  Referral From Family  Consult/Referral To Chaplain  Spiritual Encounters  Spiritual Needs Literature

## 2016-07-11 ENCOUNTER — Ambulatory Visit (HOSPITAL_COMMUNITY)
Admission: AD | Admit: 2016-07-11 | Discharge: 2016-07-11 | Disposition: A | Discharge status: Home / Self Care | Payer: PPO | Source: Other Acute Inpatient Hospital | Attending: Internal Medicine | Admitting: Internal Medicine

## 2016-07-11 DIAGNOSIS — E1122 Type 2 diabetes mellitus with diabetic chronic kidney disease: Secondary | ICD-10-CM | POA: Diagnosis not present

## 2016-07-11 DIAGNOSIS — I2582 Chronic total occlusion of coronary artery: Secondary | ICD-10-CM | POA: Diagnosis not present

## 2016-07-11 DIAGNOSIS — I5043 Acute on chronic combined systolic (congestive) and diastolic (congestive) heart failure: Secondary | ICD-10-CM | POA: Diagnosis not present

## 2016-07-11 DIAGNOSIS — Z87442 Personal history of urinary calculi: Secondary | ICD-10-CM | POA: Diagnosis not present

## 2016-07-11 DIAGNOSIS — M129 Arthropathy, unspecified: Secondary | ICD-10-CM | POA: Diagnosis not present

## 2016-07-11 DIAGNOSIS — R0609 Other forms of dyspnea: Secondary | ICD-10-CM | POA: Diagnosis not present

## 2016-07-11 DIAGNOSIS — I38 Endocarditis, valve unspecified: Secondary | ICD-10-CM | POA: Diagnosis not present

## 2016-07-11 DIAGNOSIS — I2510 Atherosclerotic heart disease of native coronary artery without angina pectoris: Secondary | ICD-10-CM | POA: Diagnosis not present

## 2016-07-11 DIAGNOSIS — Z7984 Long term (current) use of oral hypoglycemic drugs: Secondary | ICD-10-CM | POA: Diagnosis not present

## 2016-07-11 DIAGNOSIS — N184 Chronic kidney disease, stage 4 (severe): Secondary | ICD-10-CM | POA: Diagnosis not present

## 2016-07-11 DIAGNOSIS — G4733 Obstructive sleep apnea (adult) (pediatric): Secondary | ICD-10-CM | POA: Diagnosis not present

## 2016-07-11 DIAGNOSIS — G8929 Other chronic pain: Secondary | ICD-10-CM | POA: Diagnosis not present

## 2016-07-11 DIAGNOSIS — Z87891 Personal history of nicotine dependence: Secondary | ICD-10-CM | POA: Diagnosis not present

## 2016-07-11 DIAGNOSIS — I350 Nonrheumatic aortic (valve) stenosis: Secondary | ICD-10-CM | POA: Diagnosis not present

## 2016-07-11 DIAGNOSIS — D631 Anemia in chronic kidney disease: Secondary | ICD-10-CM | POA: Diagnosis not present

## 2016-07-11 DIAGNOSIS — J9690 Respiratory failure, unspecified, unspecified whether with hypoxia or hypercapnia: Secondary | ICD-10-CM | POA: Insufficient documentation

## 2016-07-11 DIAGNOSIS — I130 Hypertensive heart and chronic kidney disease with heart failure and stage 1 through stage 4 chronic kidney disease, or unspecified chronic kidney disease: Secondary | ICD-10-CM | POA: Diagnosis not present

## 2016-07-11 DIAGNOSIS — N179 Acute kidney failure, unspecified: Secondary | ICD-10-CM | POA: Diagnosis not present

## 2016-07-11 DIAGNOSIS — R768 Other specified abnormal immunological findings in serum: Secondary | ICD-10-CM | POA: Diagnosis not present

## 2016-07-11 DIAGNOSIS — E785 Hyperlipidemia, unspecified: Secondary | ICD-10-CM | POA: Diagnosis not present

## 2016-07-11 DIAGNOSIS — Z79899 Other long term (current) drug therapy: Secondary | ICD-10-CM | POA: Diagnosis not present

## 2016-07-11 DIAGNOSIS — Z9981 Dependence on supplemental oxygen: Secondary | ICD-10-CM | POA: Diagnosis not present

## 2016-07-11 DIAGNOSIS — J449 Chronic obstructive pulmonary disease, unspecified: Secondary | ICD-10-CM | POA: Diagnosis not present

## 2016-07-11 DIAGNOSIS — K219 Gastro-esophageal reflux disease without esophagitis: Secondary | ICD-10-CM | POA: Diagnosis not present

## 2016-07-11 DIAGNOSIS — F329 Major depressive disorder, single episode, unspecified: Secondary | ICD-10-CM | POA: Diagnosis not present

## 2016-07-11 DIAGNOSIS — Z85828 Personal history of other malignant neoplasm of skin: Secondary | ICD-10-CM | POA: Diagnosis not present

## 2016-07-11 MED ORDER — IPRATROPIUM-ALBUTEROL 0.5-2.5 (3) MG/3ML IN SOLN: 3.0000 mL | Freq: Four times a day (QID) | RESPIRATORY_TRACT | Status: DC

## 2016-07-11 MED ORDER — ASPIRIN 81 MG PO TBEC: 81.0000 mg | DELAYED_RELEASE_TABLET | Freq: Every day | ORAL | Status: DC

## 2016-07-11 MED ORDER — CITALOPRAM HYDROBROMIDE 20 MG PO TABS: 20.0000 mg | ORAL_TABLET | Freq: Every day | ORAL | Status: DC

## 2016-07-11 MED ORDER — FUROSEMIDE 40 MG PO TABS: 40.0000 mg | ORAL_TABLET | Freq: Two times a day (BID) | ORAL | Status: DC

## 2016-07-11 MED ORDER — SODIUM CHLORIDE 0.9 % IV SOLN: 250.0000 mL | INTRAVENOUS | 0 refills | Status: DC | PRN

## 2016-07-11 MED ORDER — SODIUM CHLORIDE 0.9% FLUSH: 3.0000 mL | INTRAVENOUS | Status: DC | PRN

## 2016-07-11 MED ORDER — HEPARIN SODIUM (PORCINE) 5000 UNIT/ML IJ SOLN: 5000.0000 [IU] | Freq: Three times a day (TID) | INTRAMUSCULAR | Status: DC

## 2016-07-11 MED ORDER — LORAZEPAM 2 MG/ML IJ SOLN: 0.5000 mg | Freq: Four times a day (QID) | INTRAMUSCULAR | 0 refills | Status: DC | PRN

## 2016-07-11 MED ORDER — METOCLOPRAMIDE HCL 5 MG/ML IJ SOLN: 5.0000 mg | Freq: Three times a day (TID) | INTRAMUSCULAR | 0 refills | Status: DC

## 2016-07-11 MED ORDER — ACETAMINOPHEN 325 MG PO TABS: 650.0000 mg | ORAL_TABLET | ORAL | Status: DC | PRN

## 2016-07-11 MED ORDER — GABAPENTIN 100 MG PO CAPS: 200.0000 mg | ORAL_CAPSULE | Freq: Two times a day (BID) | ORAL | Status: DC

## 2016-07-11 MED ORDER — SALINE SPRAY 0.65 % NA SOLN: 1.0000 | NASAL | 0 refills | Status: DC | PRN

## 2016-07-11 MED ORDER — GLUCERNA SHAKE PO LIQD: 237.0000 mL | Freq: Three times a day (TID) | ORAL | 0 refills | Status: DC

## 2016-07-11 MED ORDER — BUDESONIDE 0.5 MG/2ML IN SUSP: 0.5000 mg | Freq: Two times a day (BID) | RESPIRATORY_TRACT | 12 refills | Status: DC

## 2016-07-11 MED ORDER — FLUTICASONE PROPIONATE 50 MCG/ACT NA SUSP: 2.0000 | Freq: Every day | NASAL | 2 refills | Status: DC

## 2016-07-11 MED ORDER — SODIUM CHLORIDE 0.9% FLUSH: 10.0000 mL | INTRAVENOUS | Status: DC | PRN

## 2016-07-11 MED ORDER — ALPRAZOLAM 0.25 MG PO TABS
0.2500 mg | ORAL_TABLET | Freq: Three times a day (TID) | ORAL | Status: DC | PRN
  Administered 2016-07-11: 0.25 mg via ORAL
  Filled 2016-07-11: qty 1

## 2016-07-11 MED ORDER — TAMSULOSIN HCL 0.4 MG PO CAPS: 0.4000 mg | ORAL_CAPSULE | Freq: Every day | ORAL | Status: DC

## 2016-07-11 MED ORDER — ONDANSETRON HCL 4 MG/2ML IJ SOLN: 4.0000 mg | Freq: Four times a day (QID) | INTRAMUSCULAR | 0 refills | Status: DC | PRN

## 2016-07-11 MED ORDER — SODIUM CHLORIDE 0.9% FLUSH: 3.0000 mL | Freq: Two times a day (BID) | INTRAVENOUS | Status: DC

## 2016-07-11 MED ORDER — SODIUM CHLORIDE 0.9% FLUSH: 10.0000 mL | Freq: Two times a day (BID) | INTRAVENOUS | Status: DC

## 2016-07-11 MED ORDER — INSULIN ASPART 100 UNIT/ML ~~LOC~~ SOLN: 0.0000 [IU] | SUBCUTANEOUS | 11 refills | Status: DC

## 2016-07-11 MED ORDER — FAMOTIDINE 20 MG PO TABS
20.0000 mg | ORAL_TABLET | Freq: Every day | ORAL | Status: DC
Start: 2016-07-11 — End: 2020-02-05

## 2016-07-11 MED ORDER — MORPHINE SULFATE (PF) 4 MG/ML IV SOLN: 2.0000 mg | INTRAVENOUS | 0 refills | Status: DC | PRN

## 2016-07-11 MED ORDER — METHYLPREDNISOLONE SODIUM SUCC 40 MG IJ SOLR: 40.0000 mg | Freq: Two times a day (BID) | INTRAMUSCULAR | 0 refills | Status: DC

## 2016-07-11 NOTE — Progress Notes (Signed)
Judson Roch from [facility] called with a bed assignment for Mr. Fowle.  Pt will be going to [facility] at the main hospital on [address].  [phone])  Attending is Dr. Yvone Neu.  CD of imaging is included in the transfer packet.  [facility] was called and pt was placed on the list to transfer to [facility].  Report was called to [place], rn.  All questions were answered.  Pt is alert and oriented with no complaints of pain and has been made aware of plans to transfer to [facility].  Pt is in agreement.

## 2016-07-11 NOTE — Progress Notes (Signed)
Pt transported to [facility] via [place].  Wife at bedside.

## 2016-07-11 NOTE — Discharge Summary (Signed)
Physician Discharge Summary  Patient ID: [name] [name] MRN: [id number] DOB/AGE: 73-07-1945 73 y.o.  Admit date: 07/05/2016 Discharge date: 07/11/2016    Discharge Diagnoses:        Acute Hypoxic Hypercapnic Respiratory Failure secondary to CHF exacerbation & AECOPD       Elevated troponin likely secondary to demand ischemia due to respiratory failure        Severe aortic stenosis with moderate LV dysfunction left ventricle ejection fraction        approximately 45%       Acute on Chronic Renal Failure with Hyperkalemia secondary to cardiorenal syndrome       Constipation-resolved        Hypotension-resolved       Diabetes Mellitus                                                                         DISCHARGE PLAN BY DIAGNOSIS   ASSESSMENT / PLAN: PULMONARY A: Acute on Chronic Hypoxic Hypercapnic Respiratory Failure secondary to CHF exacerbation & AECOPD Hx: COPD, 3L Home O2 P:   Supplemental O2 or prn Bipap to maintain O2 sats 90% to 94% or for dyspnea  Continue scheduled bronchodilator therapy  Continue IV Solumedrol wean as tolerated  Intermittent CXR  CARDIOVASCULAR A:  CHF exacerbation Elevated troponin likely secondary to demand ischemia due to respiratory failure  Severe aortic stenosis with moderate LV dysfunction left ventricle ejection fraction approximately 45% Hx: CHF, HTN, CAD P:  Continuous Telemetry monitoring Continue aspirin and furosemide  RENAL A:   Acute on Chronic Renal Failure with Hyperkalemia secondary to cardiorenal syndrome (required CRRT 03/15 to 0318) Hx: Kidney stones, CKD Stage III with proteinuria: baseline creatinine 1.75 P:   Trend BMP's Replace electrolytes as indicated Monitor UOP Nephrology consulted appreciate input  GASTROINTESTINAL A:   Constipation-resolved  P:   Pepcid for SUP Heart Healthy/Carb modified diet Continue reglan  HEMATOLOGIC A:   No acute issues P:  Subq Heparin for VTE  prophylaxis Trend CBC Monitor for s/sx of bleeding Transfuse for hgb <7  INFECTIOUS A:   No acute issues  P:   Trend WBC and monitor fever curve  ENDOCRINE A:   Diabetes Mellitus  P:   CBG q4h SSI q4h         DISCHARGE SUMMARY   [name] [name] is a 71 y.o. y/o male with a PMH of Renal disorder, Kidney stones, HTN, DM, COPD, Obstructive sleep apnea, 3L Home O2, CAD, Hyperlipidemia, and CHF.  He presented to [facility] ER on 3/14 with c/o progressive shortness of breath.  Pts wife reported the pts shortness of breath began 2 weeks prior to presentation to the ER and had gotten  progressively worse, causing him to increase his O2 requirements from 3L to 5L.  She also reported he had a 10 lb weight gain over the week prior to presentation to the ER with bilateral lower extremity swelling.  He is a former smoker he quit 12 yrs ago, however he does have exposure to second hand smoke as his wife is a current smoker.  In the ED, CXR was concerning for pulmonary edema with superimposed on AECOPD. Therefore, he was admitted to the ICU with acute on  chronic hypoxic hypercapnic respiratory failure secondary to a CHF exacerbation and AECOPD requiring continuous Bipap.  Due to worsening renal function and acute respiratory failure secondary to volume overload per Nephrology recommendations he was placed on continuous renal replacement therapy on 03/15 to 03/18.  Pts cardiologist Dr. [name] spoke with pts family regarding pts severe aortic stenosis with moderate LV dysfunction with the potential option of aortic valve replacement, therefore request was made by Dr. Benedict Needy for the pt to be transferred to [facility] hospital to evaluate if he is a candidate for aortic valve replacement with his current respiratory status as well as acute renal failure.  He was accepted by [facility] on 03/20 and his accepting physician is Dr. Yvone Neu.  SIGNIFICANT DIAGNOSTIC STUDIES Echo 07/06/16>>EF 40%  Moderate left ventricular systolic dysfunction with grade 1 diastolic dysfunction and severe aortic stenosis with gradient over 40 mm or mercury.  SIGNIFICANT EVENTS 03/14-Pt admitted to [facility] ICU with acute on chronic hypercapnic hypoxic respiratory failure secondary to CHF exacerbation and AECOPD requiring continuous Bipap 03/15-CRRT initiated due to worsening renal failure 03/18-CRRT discontinued per Nephrology recommendations  MICRO DATA  Cdiff 03/16>>negative  MRSA PCR 03/14>>negative   ANTIBIOTICS None  CONSULTS Nephrology Cardiology Intensivist   TUBES / LINES Left IJ CVL 07/06/16>> Right Femoral Dual lumen HD Cath 03/15>>  Discharge Exam: General: well developed well nourished Caucasian male, resting in bed, in NAD. Neuro: A&O x 3, non-focal.  HEENT: [place]/AT. PERRL, sclerae anicteric. Cardiovascular: NSR, RRR, no M/R/G.  Lungs: Respirations even and unlabored, diminished throughout, no rales, rhonchi, or wheezes  Abdomen: BS x 4, soft, NT/ND, obese.  Musculoskeletal: No gross deformities, no edema.  Skin: Intact, warm, no rashes, ecchymosis right lower extremity at dialysis cath site, area soft no hematoma.   Vitals:   07/10/16 2000 07/10/16 2054 07/10/16 2100 07/10/16 2200  BP: 124/66  125/70 119/64  Pulse: 80  85 85  Resp: (!) 28  13 (!) 21  Temp:    97.7 F (36.5 C)  TempSrc:    Oral  SpO2: 90% 95% 98% 94%  Weight:      Height:         Discharge Labs  BMET  Recent Labs Lab 07/07/16 1740  07/09/16 0550 07/09/16 0930 07/09/16 1425 07/09/16 1751 07/10/16 0435  NA 138  < > 137 137 133* 134* 131*  K 4.8  < > 4.7 4.5 5.0 5.2* 5.4*  CL 104  < > 106 105 102 102 101  CO2 28  < > 28 27 28 28 26   GLUCOSE 185*  < > 147* 160* 220* 172* 182*  BUN 34*  < > 28* 27* 29* 31* 47*  CREATININE 1.42*  < > 1.31* 1.26* 1.30* 1.38* 1.80*  CALCIUM 9.0  < > 8.4* 8.7* 9.1 9.3 9.1  MG 2.2  --  2.0  --   --   --  2.4  PHOS 3.0  3.1  < > 3.1  3.2 3.4 3.0 3.1 4.3  < >  = values in this interval not displayed.  CBC  Recent Labs Lab 07/06/16 0236 07/08/16 0533 07/10/16 0435  HGB 14.3 13.3 13.8  HCT 41.7 38.2* 39.6*  WBC 8.1 5.6 8.8  PLT 203 126* 110*    Anti-Coagulation No results for input(s): INR in the last 168 hours.        Allergies as of 07/11/2016   No Known Allergies     Medication List    STOP taking  these medications   ALLEGRA ALLERGY 180 MG tablet Generic drug:  fexofenadine   aspirin 81 MG chewable tablet Replaced by:  aspirin 81 MG EC tablet   Chromium Picolinate 500 MCG Caps   fenofibrate 160 MG tablet   fluticasone-salmeterol 115-21 MCG/ACT inhaler Commonly known as:  ADVAIR HFA   glimepiride 2 MG tablet Commonly known as:  AMARYL   GLUCOSAMINE CHONDROITIN COMPLX Caps   losartan 25 MG tablet Commonly known as:  COZAAR   Magnesium 500 MG Caps   methocarbamol 750 MG tablet Commonly known as:  ROBAXIN   metoprolol succinate 50 MG 24 hr tablet Commonly known as:  TOPROL-XL   niacin 500 MG tablet   pantoprazole 40 MG tablet Commonly known as:  PROTONIX   Potassium 99 MG Tabs   pravastatin 20 MG tablet Commonly known as:  PRAVACHOL   SPIRIVA RESPIMAT 1.25 MCG/ACT Aers Generic drug:  Tiotropium Bromide Monohydrate   spironolactone 25 MG tablet Commonly known as:  ALDACTONE   SUPER B COMPLEX PO   testosterone cypionate 200 MG/ML injection Commonly known as:  DEPOTESTOSTERONE CYPIONATE   VENTOLIN HFA 108 (90 Base) MCG/ACT inhaler Generic drug:  albuterol   vitamin C 1000 MG tablet   VITAMIN D-1000 MAX ST 1000 units tablet Generic drug:  Cholecalciferol     TAKE these medications   acetaminophen 325 MG tablet Commonly known as:  TYLENOL Take 2 tablets (650 mg total) by mouth every 4 (four) hours as needed for mild pain (temp > 101.5).   aspirin 81 MG EC tablet Take 1 tablet (81 mg total) by mouth daily. Replaces:  aspirin 81 MG chewable tablet   budesonide 0.5 MG/2ML nebulizer  solution Commonly known as:  PULMICORT Take 2 mLs (0.5 mg total) by nebulization 2 (two) times daily.   citalopram 20 MG tablet Commonly known as:  CELEXA Take 1 tablet (20 mg total) by mouth daily.   famotidine 20 MG tablet Commonly known as:  PEPCID Take 1 tablet (20 mg total) by mouth at bedtime.   feeding supplement (GLUCERNA SHAKE) Liqd Take 237 mLs by mouth 3 (three) times daily between meals.   fluticasone 50 MCG/ACT nasal spray Commonly known as:  FLONASE Place 2 sprays into both nostrils daily. What changed:  when to take this  reasons to take this   furosemide 40 MG tablet Commonly known as:  LASIX Take 1 tablet (40 mg total) by mouth 2 (two) times daily. What changed:  medication strength  how much to take  when to take this   gabapentin 100 MG capsule Commonly known as:  NEURONTIN Take 2 capsules (200 mg total) by mouth 2 (two) times daily.   heparin 5000 UNIT/ML injection Inject 1 mL (5,000 Units total) into the skin every 8 (eight) hours.   insulin aspart 100 UNIT/ML injection Commonly known as:  novoLOG Inject 0-9 Units into the skin every 4 (four) hours.   ipratropium-albuterol 0.5-2.5 (3) MG/3ML Soln Commonly known as:  DUONEB Take 3 mLs by nebulization every 6 (six) hours.   LORazepam 2 MG/ML injection Commonly known as:  ATIVAN Inject 0.25 mLs (0.5 mg total) into the vein every 6 (six) hours as needed for anxiety.   methylPREDNISolone sodium succinate 40 mg/mL injection Commonly known as:  SOLU-MEDROL Inject 1 mL (40 mg total) into the vein every 12 (twelve) hours.   metoCLOPramide 5 MG/ML injection Commonly known as:  REGLAN Inject 1 mL (5 mg total) into the vein every 8 (eight) hours.  morphine 4 MG/ML injection Inject 0.5 mLs (2 mg total) into the vein every 2 (two) hours as needed for moderate pain or severe pain (anxiety).   ondansetron 4 MG/2ML Soln injection Commonly known as:  ZOFRAN Inject 2 mLs (4 mg total) into the vein  every 6 (six) hours as needed for nausea.   sodium chloride 0.65 % Soln nasal spray Commonly known as:  OCEAN Place 1 spray into both nostrils as needed for congestion.   sodium chloride 0.9 % infusion Inject 250 mLs into the vein as needed (for IV line care(Saline / Heparin Lock)).   sodium chloride 0.9 % infusion Inject 250 mLs into the vein as needed (if IV carrier fluid needed.).   sodium chloride flush 0.9 % Soln Commonly known as:  NS 10-40 mLs by Intracatheter route every 12 (twelve) hours.   sodium chloride flush 0.9 % Soln Commonly known as:  NS 10-40 mLs by Intracatheter route as needed (flush).   sodium chloride flush 0.9 % Soln Commonly known as:  NS Inject 3 mLs into the vein every 12 (twelve) hours.   sodium chloride flush 0.9 % Soln Commonly known as:  NS Inject 3 mLs into the vein as needed.   tamsulosin 0.4 MG Caps capsule Commonly known as:  FLOMAX Take 1 capsule (0.4 mg total) by mouth daily. What changed:  when to take this        Disposition: Pt currently stable and can transfer to [place] [place] Pager [phone] (please enter 7 digits) PCCM Consult Pager [phone] (please enter 7 digits)

## 2016-07-11 NOTE — Progress Notes (Signed)
[facility] was given report.  Pt awaiting transport.  Wife at side.

## 2016-07-12 DIAGNOSIS — I350 Nonrheumatic aortic (valve) stenosis: Secondary | ICD-10-CM | POA: Diagnosis not present

## 2016-07-12 DIAGNOSIS — I2510 Atherosclerotic heart disease of native coronary artery without angina pectoris: Secondary | ICD-10-CM | POA: Diagnosis not present

## 2016-07-12 DIAGNOSIS — N183 Chronic kidney disease, stage 3 (moderate): Secondary | ICD-10-CM | POA: Diagnosis not present

## 2016-07-12 DIAGNOSIS — J449 Chronic obstructive pulmonary disease, unspecified: Secondary | ICD-10-CM | POA: Diagnosis not present

## 2016-07-13 DIAGNOSIS — N183 Chronic kidney disease, stage 3 (moderate): Secondary | ICD-10-CM | POA: Diagnosis not present

## 2016-07-13 DIAGNOSIS — I5043 Acute on chronic combined systolic (congestive) and diastolic (congestive) heart failure: Secondary | ICD-10-CM | POA: Diagnosis not present

## 2016-07-13 DIAGNOSIS — I2582 Chronic total occlusion of coronary artery: Secondary | ICD-10-CM | POA: Diagnosis not present

## 2016-07-13 DIAGNOSIS — I350 Nonrheumatic aortic (valve) stenosis: Secondary | ICD-10-CM | POA: Diagnosis not present

## 2016-07-13 DIAGNOSIS — I2510 Atherosclerotic heart disease of native coronary artery without angina pectoris: Secondary | ICD-10-CM | POA: Diagnosis not present

## 2016-07-13 DIAGNOSIS — J449 Chronic obstructive pulmonary disease, unspecified: Secondary | ICD-10-CM | POA: Diagnosis not present

## 2016-07-14 DIAGNOSIS — N183 Chronic kidney disease, stage 3 (moderate): Secondary | ICD-10-CM | POA: Diagnosis not present

## 2016-07-14 DIAGNOSIS — R918 Other nonspecific abnormal finding of lung field: Secondary | ICD-10-CM | POA: Diagnosis not present

## 2016-07-14 DIAGNOSIS — I2510 Atherosclerotic heart disease of native coronary artery without angina pectoris: Secondary | ICD-10-CM | POA: Diagnosis not present

## 2016-07-14 DIAGNOSIS — J449 Chronic obstructive pulmonary disease, unspecified: Secondary | ICD-10-CM | POA: Diagnosis not present

## 2016-07-14 DIAGNOSIS — Z952 Presence of prosthetic heart valve: Secondary | ICD-10-CM | POA: Diagnosis not present

## 2016-07-14 DIAGNOSIS — I350 Nonrheumatic aortic (valve) stenosis: Secondary | ICD-10-CM | POA: Diagnosis not present

## 2016-07-17 DIAGNOSIS — N179 Acute kidney failure, unspecified: Secondary | ICD-10-CM | POA: Diagnosis not present

## 2016-07-17 DIAGNOSIS — M799 Soft tissue disorder, unspecified: Secondary | ICD-10-CM | POA: Diagnosis not present

## 2016-07-17 DIAGNOSIS — N183 Chronic kidney disease, stage 3 (moderate): Secondary | ICD-10-CM | POA: Diagnosis not present

## 2016-07-17 DIAGNOSIS — R600 Localized edema: Secondary | ICD-10-CM | POA: Diagnosis not present

## 2016-07-17 DIAGNOSIS — N050 Unspecified nephritic syndrome with minor glomerular abnormality: Secondary | ICD-10-CM | POA: Diagnosis not present

## 2016-07-23 DIAGNOSIS — J449 Chronic obstructive pulmonary disease, unspecified: Secondary | ICD-10-CM | POA: Diagnosis not present

## 2016-07-24 ENCOUNTER — Emergency Department: Payer: PPO

## 2016-07-24 ENCOUNTER — Emergency Department
Admission: EM | Admit: 2016-07-24 | Discharge: 2016-07-25 | Disposition: A | Discharge status: Other Acute Inpatient Hospital | Payer: PPO | Attending: Emergency Medicine | Admitting: Emergency Medicine

## 2016-07-24 ENCOUNTER — Encounter: Payer: Self-pay | Admitting: Emergency Medicine

## 2016-07-24 DIAGNOSIS — Z87891 Personal history of nicotine dependence: Secondary | ICD-10-CM | POA: Insufficient documentation

## 2016-07-24 DIAGNOSIS — I959 Hypotension, unspecified: Secondary | ICD-10-CM | POA: Diagnosis not present

## 2016-07-24 DIAGNOSIS — Z794 Long term (current) use of insulin: Secondary | ICD-10-CM | POA: Diagnosis not present

## 2016-07-24 DIAGNOSIS — J449 Chronic obstructive pulmonary disease, unspecified: Secondary | ICD-10-CM | POA: Diagnosis not present

## 2016-07-24 DIAGNOSIS — I110 Hypertensive heart disease with heart failure: Secondary | ICD-10-CM | POA: Diagnosis not present

## 2016-07-24 DIAGNOSIS — I509 Heart failure, unspecified: Secondary | ICD-10-CM | POA: Diagnosis not present

## 2016-07-24 DIAGNOSIS — R0602 Shortness of breath: Secondary | ICD-10-CM | POA: Diagnosis not present

## 2016-07-24 DIAGNOSIS — Z7982 Long term (current) use of aspirin: Secondary | ICD-10-CM | POA: Diagnosis not present

## 2016-07-24 DIAGNOSIS — R531 Weakness: Secondary | ICD-10-CM

## 2016-07-24 DIAGNOSIS — E119 Type 2 diabetes mellitus without complications: Secondary | ICD-10-CM | POA: Insufficient documentation

## 2016-07-24 DIAGNOSIS — Z9989 Dependence on other enabling machines and devices: Secondary | ICD-10-CM | POA: Diagnosis not present

## 2016-07-24 LAB — URINALYSIS, COMPLETE (UACMP) WITH MICROSCOPIC
BILIRUBIN URINE: NEGATIVE
Bacteria, UA: NONE SEEN
Glucose, UA: NEGATIVE mg/dL
Hgb urine dipstick: NEGATIVE
Ketones, ur: NEGATIVE mg/dL
LEUKOCYTES UA: NEGATIVE
Nitrite: NEGATIVE
PH: 5 (ref 5.0–8.0)
Protein, ur: 100 mg/dL — AB
SPECIFIC GRAVITY, URINE: 1.013 (ref 1.005–1.030)
Squamous Epithelial / HPF: NONE SEEN

## 2016-07-24 LAB — BASIC METABOLIC PANEL
Anion gap: 10 (ref 5–15)
BUN: 54 mg/dL — AB (ref 6–20)
CALCIUM: 8 mg/dL — AB (ref 8.9–10.3)
CO2: 26 mmol/L (ref 22–32)
Chloride: 98 mmol/L — ABNORMAL LOW (ref 101–111)
Creatinine, Ser: 2.91 mg/dL — ABNORMAL HIGH (ref 0.61–1.24)
GFR, EST AFRICAN AMERICAN: 23 mL/min — AB (ref >60)
GFR, EST NON AFRICAN AMERICAN: 20 mL/min — AB (ref >60)
Glucose, Bld: 140 mg/dL — ABNORMAL HIGH (ref 65–99)
Potassium: 3.5 mmol/L (ref 3.5–5.1)
SODIUM: 134 mmol/L — AB (ref 135–145)

## 2016-07-24 LAB — CBC
HCT: 34.8 % — ABNORMAL LOW (ref 40.0–52.0)
Hemoglobin: 12.1 g/dL — ABNORMAL LOW (ref 13.0–18.0)
MCH: 33.7 pg (ref 26.0–34.0)
MCHC: 34.9 g/dL (ref 32.0–36.0)
MCV: 96.7 fL (ref 80.0–100.0)
PLATELETS: 191 10*3/uL (ref 150–440)
RBC: 3.6 MIL/uL — AB (ref 4.40–5.90)
RDW: 14.1 % (ref 11.5–14.5)
WBC: 5.7 10*3/uL (ref 3.8–10.6)

## 2016-07-24 LAB — BRAIN NATRIURETIC PEPTIDE: B NATRIURETIC PEPTIDE 5: 634 pg/mL — AB (ref 0.0–100.0)

## 2016-07-24 MED ORDER — SODIUM CHLORIDE 0.9 % IV BOLUS (SEPSIS)
500.0000 mL | Freq: Once | INTRAVENOUS | Status: AC
  Administered 2016-07-24: 500 mL via INTRAVENOUS

## 2016-07-24 NOTE — ED Provider Notes (Signed)
[facility] Emergency Department Provider Note  Time seen: 8:09 PM  I have reviewed the triage vital signs and the nursing notes.   HISTORY  Chief Complaint Weakness and Shortness of Breath    HPI [name] [name] is a 71 y.o. male with a past medical history of CHF, COPD on 3 L of oxygen at baseline, hypertension, CK D, presents the emergency department with weakness and low blood pressure. According to the patient around 4 5 PM tonight he had a sudden onset feeling of weakness in which he felt that his legs were very weak. Patient thought he was going to pass out so he sat down and his family checked his blood pressure and found it to be in the 80s over 40s. Of note the patient has been told he needs a cardiac valve replacement but this is not scheduled until May. Denies any chest pain or increased shortness breath over baseline currently. Upon arrival to the emergency department patient continues to feel a sensation of generalized weakness, blood pressure 96/53.  Past Medical History:  Diagnosis Date  . CHF (congestive heart failure) ([place])   . COPD (chronic obstructive pulmonary disease) ([place])   . Diabetes mellitus without complication ([place])   . Hypertension   . Kidney stones   . Renal disorder     Patient Active Problem List   Diagnosis Date Noted  . Acute renal failure (ARF) ([place])   . COPD exacerbation ([place])   . Respiratory failure ([place]) 07/05/2016    Past Surgical History:  Procedure Laterality Date  . HERNIA REPAIR    . LITHOTRIPSY      Prior to Admission medications   Medication Sig Start Date End Date Taking? Authorizing Provider  acetaminophen (TYLENOL) 325 MG tablet Take 2 tablets (650 mg total) by mouth every 4 (four) hours as needed for mild pain (temp > 101.5). 07/11/16   Awilda Bill, NP  aspirin EC 81 MG EC tablet Take 1 tablet (81 mg total) by mouth daily. 07/11/16   Awilda Bill, NP  budesonide (PULMICORT) 0.5 MG/2ML  nebulizer solution Take 2 mLs (0.5 mg total) by nebulization 2 (two) times daily. 07/11/16   Awilda Bill, NP  citalopram (CELEXA) 20 MG tablet Take 1 tablet (20 mg total) by mouth daily. 07/11/16   Awilda Bill, NP  famotidine (PEPCID) 20 MG tablet Take 1 tablet (20 mg total) by mouth at bedtime. 07/11/16   Awilda Bill, NP  feeding supplement, GLUCERNA SHAKE, (GLUCERNA SHAKE) LIQD Take 237 mLs by mouth 3 (three) times daily between meals. 07/11/16   Awilda Bill, NP  fluticasone (FLONASE) 50 MCG/ACT nasal spray Place 2 sprays into both nostrils daily. 07/11/16   Awilda Bill, NP  furosemide (LASIX) 40 MG tablet Take 1 tablet (40 mg total) by mouth 2 (two) times daily. 07/11/16   Awilda Bill, NP  gabapentin (NEURONTIN) 100 MG capsule Take 2 capsules (200 mg total) by mouth 2 (two) times daily. 07/11/16   Awilda Bill, NP  heparin 5000 UNIT/ML injection Inject 1 mL (5,000 Units total) into the skin every 8 (eight) hours. 07/11/16   Awilda Bill, NP  insulin aspart (NOVOLOG) 100 UNIT/ML injection Inject 0-9 Units into the skin every 4 (four) hours. 07/11/16   Awilda Bill, NP  ipratropium-albuterol (DUONEB) 0.5-2.5 (3) MG/3ML SOLN Take 3 mLs by nebulization every 6 (six) hours. 07/11/16   Awilda Bill, NP  LORazepam (ATIVAN) 2 MG/ML  injection Inject 0.25 mLs (0.5 mg total) into the vein every 6 (six) hours as needed for anxiety. 07/11/16   Awilda Bill, NP  methylPREDNISolone sodium succinate (SOLU-MEDROL) 40 mg/mL injection Inject 1 mL (40 mg total) into the vein every 12 (twelve) hours. 07/11/16   Awilda Bill, NP  metoCLOPramide (REGLAN) 5 MG/ML injection Inject 1 mL (5 mg total) into the vein every 8 (eight) hours. 07/11/16   Awilda Bill, NP  morphine 4 MG/ML injection Inject 0.5 mLs (2 mg total) into the vein every 2 (two) hours as needed for moderate pain or severe pain (anxiety). 07/11/16   Awilda Bill, NP  ondansetron (ZOFRAN) 4 MG/2ML SOLN injection Inject 2 mLs  (4 mg total) into the vein every 6 (six) hours as needed for nausea. 07/11/16   Awilda Bill, NP  sodium chloride (OCEAN) 0.65 % SOLN nasal spray Place 1 spray into both nostrils as needed for congestion. 07/11/16   Awilda Bill, NP  sodium chloride 0.9 % infusion Inject 250 mLs into the vein as needed (for IV line care(Saline / Heparin Lock)). 07/11/16   Awilda Bill, NP  sodium chloride 0.9 % infusion Inject 250 mLs into the vein as needed (if IV carrier fluid needed.). 07/11/16   Awilda Bill, NP  sodium chloride flush (NS) 0.9 % SOLN 10-40 mLs by Intracatheter route every 12 (twelve) hours. 07/11/16   Awilda Bill, NP  sodium chloride flush (NS) 0.9 % SOLN 10-40 mLs by Intracatheter route as needed (flush). 07/11/16   Awilda Bill, NP  sodium chloride flush (NS) 0.9 % SOLN Inject 3 mLs into the vein every 12 (twelve) hours. 07/11/16   Awilda Bill, NP  sodium chloride flush (NS) 0.9 % SOLN Inject 3 mLs into the vein as needed. 07/11/16   Awilda Bill, NP  tamsulosin (FLOMAX) 0.4 MG CAPS capsule Take 1 capsule (0.4 mg total) by mouth daily. 07/11/16   Awilda Bill, NP    No Known Allergies  No family history on file.  Social History Social History  Substance Use Topics  . Smoking status: Former Research scientist (life sciences)  . Smokeless tobacco: Former Systems developer    Quit date: 05/16/2004  . Alcohol use No    Review of Systems Constitutional: Negative for fever.Positive for generalized weakness. Cardiovascular: Negative for chest pain. Respiratory: Negative for shortness of breath. Gastrointestinal: Negative for abdominal pain Neurological: Negative for headache 10-point ROS otherwise negative.  ____________________________________________   PHYSICAL EXAM:  VITAL SIGNS: ED Triage Vitals  Enc Vitals Group     BP 07/24/16 1915 (!) 93/53     Pulse Rate 07/24/16 1915 90     Resp 07/24/16 1915 (!) 25     Temp 07/24/16 1915 97.8 F (36.6 C)     Temp Source 07/24/16 1915 Oral     SpO2  07/24/16 1915 92 %     Weight 07/24/16 1917 225 lb (102.1 kg)     Height 07/24/16 1917 5\' 6"  (1.676 m)     Head Circumference --      Peak Flow --      Pain Score --      Pain Loc --      Pain Edu? --      Excl. in [place]? --     Constitutional: Alert and oriented. Well appearing and in no distress. Eyes: Normal exam ENT   Head: Normocephalic and atraumatic   Mouth/Throat: Mucous membranes are moist. Cardiovascular: Normal  rate, regular rhythm.  Respiratory: Normal respiratory effort without tachypnea nor retractions. Breath sounds are clear  Gastrointestinal: Soft and nontender. Obese. Musculoskeletal: Nontender with normal range of motion in all extremities. No lower extremity tenderness. Neurologic:  Normal speech and language. No gross focal neurologic deficits Skin:  Skin is warm, dry and intact.  Psychiatric: Mood and affect are normal.  ____________________________________________    EKG  EKG reviewed and interpreted by myself shows normal sinus rhythm at 90 bpm, slightly widened QRS, normal axis, slightly prolonged QTC with nonspecific ST changes. No ST elevation.  ____________________________________________    RADIOLOGY  Chest x-ray shows interstitial pulmonary edema  ____________________________________________   INITIAL IMPRESSION / ASSESSMENT AND PLAN / ED COURSE  Pertinent labs & imaging results that were available during my care of the patient were reviewed by me and considered in my medical decision making (see chart for details).  Patient presents to emergency department generalized weakness and hypotension at home. EMS reports 80s over 40 blood pressure. Upon arrival 96/53. We will check labs, EKG, close monitor in the emergency department.  Patient remains hypotensive despite 500 cc bolus by EMS currently receiving a second 500 cc bolus in the emergency department. Current blood pressures 86/55. Patient is chest x-ray shows interstitial pulmonary  edema. Patient's labs are otherwise largely normal. Patients troponin is pending. I reviewed the [facility]'s care [facility] from his recent admission. At that time the patient was found have aortic valve calcifications with stenosis. At that time the patient's blood pressure was normal [address] systolic. Given the patient's acute weakness with hypotension despite IV fluids we will discuss with cardiology at [facility] to see if the patient would benefit from transfer back to their facility as this could very likely be a result of the patient's aortic stenosis and we would not have the capability to perform a valve repair or replacement at [facility]. Overall the patient appears well, sits upright states he becomes too short of breath when he attempts to lie flat. I will hold off on Lasix at this time given the patient's hypotension. We will discuss with [place] cardiology before giving Lasix.   ----------------------------------------- 10:51 PM on 07/24/2016 -----------------------------------------  I discussed the patient with [facility] cardiology CCU fellow. Given the patient's persistent hypotension they would like the patient transferred to [facility] for further workup. Patient will be transferred as soon as a bed becomes available.   ____________________________________________   FINAL CLINICAL IMPRESSION(S) / ED DIAGNOSES  Weakness Hypotension CHF   Harvest Dark, MD 07/24/16 2251

## 2016-07-24 NOTE — ED Notes (Signed)
Pt given water - pt still unable to give urine sample

## 2016-07-24 NOTE — ED Notes (Signed)
Pt given sandwich tray 

## 2016-07-24 NOTE — ED Triage Notes (Signed)
Patient from home via [facility]. Reports increased SOB with exertion today. Reports after ambulating without oxygen he had a near syncopal episode. States after replacing oxygen he returned to baseline. Patient states he had CBG at home of 58 which is "very low" for him. Patient states now he is having increased weakness. EMS reports initial BP of 68/22. Patient reports PCP increased lasix to 80mg  last week. Upon arrival patient reports continued SOB and weakness. A&O x4.

## 2016-07-25 DIAGNOSIS — R918 Other nonspecific abnormal finding of lung field: Secondary | ICD-10-CM | POA: Diagnosis not present

## 2016-07-25 DIAGNOSIS — Z9981 Dependence on supplemental oxygen: Secondary | ICD-10-CM | POA: Diagnosis not present

## 2016-07-25 DIAGNOSIS — J449 Chronic obstructive pulmonary disease, unspecified: Secondary | ICD-10-CM | POA: Diagnosis not present

## 2016-07-25 DIAGNOSIS — Z9119 Patient's noncompliance with other medical treatment and regimen: Secondary | ICD-10-CM | POA: Diagnosis not present

## 2016-07-25 DIAGNOSIS — I2510 Atherosclerotic heart disease of native coronary artery without angina pectoris: Secondary | ICD-10-CM | POA: Diagnosis not present

## 2016-07-25 DIAGNOSIS — D649 Anemia, unspecified: Secondary | ICD-10-CM | POA: Diagnosis not present

## 2016-07-25 DIAGNOSIS — F419 Anxiety disorder, unspecified: Secondary | ICD-10-CM | POA: Diagnosis not present

## 2016-07-25 DIAGNOSIS — R0902 Hypoxemia: Secondary | ICD-10-CM | POA: Diagnosis not present

## 2016-07-25 DIAGNOSIS — I950 Idiopathic hypotension: Secondary | ICD-10-CM | POA: Diagnosis not present

## 2016-07-25 DIAGNOSIS — Z87891 Personal history of nicotine dependence: Secondary | ICD-10-CM | POA: Diagnosis not present

## 2016-07-25 DIAGNOSIS — E785 Hyperlipidemia, unspecified: Secondary | ICD-10-CM | POA: Diagnosis not present

## 2016-07-25 DIAGNOSIS — G4733 Obstructive sleep apnea (adult) (pediatric): Secondary | ICD-10-CM | POA: Diagnosis not present

## 2016-07-25 DIAGNOSIS — I5022 Chronic systolic (congestive) heart failure: Secondary | ICD-10-CM | POA: Diagnosis not present

## 2016-07-25 DIAGNOSIS — Z7984 Long term (current) use of oral hypoglycemic drugs: Secondary | ICD-10-CM | POA: Diagnosis not present

## 2016-07-25 DIAGNOSIS — E1122 Type 2 diabetes mellitus with diabetic chronic kidney disease: Secondary | ICD-10-CM | POA: Diagnosis not present

## 2016-07-25 DIAGNOSIS — E11649 Type 2 diabetes mellitus with hypoglycemia without coma: Secondary | ICD-10-CM | POA: Diagnosis not present

## 2016-07-25 DIAGNOSIS — Z006 Encounter for examination for normal comparison and control in clinical research program: Secondary | ICD-10-CM | POA: Diagnosis not present

## 2016-07-25 DIAGNOSIS — N184 Chronic kidney disease, stage 4 (severe): Secondary | ICD-10-CM | POA: Diagnosis not present

## 2016-07-25 DIAGNOSIS — Z79899 Other long term (current) drug therapy: Secondary | ICD-10-CM | POA: Diagnosis not present

## 2016-07-25 DIAGNOSIS — I350 Nonrheumatic aortic (valve) stenosis: Secondary | ICD-10-CM | POA: Diagnosis not present

## 2016-07-25 DIAGNOSIS — R338 Other retention of urine: Secondary | ICD-10-CM | POA: Diagnosis not present

## 2016-07-25 DIAGNOSIS — E7800 Pure hypercholesterolemia, unspecified: Secondary | ICD-10-CM | POA: Diagnosis not present

## 2016-07-25 DIAGNOSIS — G8918 Other acute postprocedural pain: Secondary | ICD-10-CM | POA: Diagnosis not present

## 2016-07-25 DIAGNOSIS — Z87442 Personal history of urinary calculi: Secondary | ICD-10-CM | POA: Diagnosis not present

## 2016-07-25 DIAGNOSIS — I4581 Long QT syndrome: Secondary | ICD-10-CM | POA: Diagnosis not present

## 2016-07-25 DIAGNOSIS — E162 Hypoglycemia, unspecified: Secondary | ICD-10-CM | POA: Diagnosis not present

## 2016-07-25 DIAGNOSIS — Z85828 Personal history of other malignant neoplasm of skin: Secondary | ICD-10-CM | POA: Diagnosis not present

## 2016-07-25 DIAGNOSIS — I130 Hypertensive heart and chronic kidney disease with heart failure and stage 1 through stage 4 chronic kidney disease, or unspecified chronic kidney disease: Secondary | ICD-10-CM | POA: Diagnosis not present

## 2016-07-25 DIAGNOSIS — E1142 Type 2 diabetes mellitus with diabetic polyneuropathy: Secondary | ICD-10-CM | POA: Diagnosis not present

## 2016-07-25 LAB — TROPONIN I: Troponin I: <0.03 ng/mL (ref <0.03)

## 2016-07-26 DIAGNOSIS — I959 Hypotension, unspecified: Secondary | ICD-10-CM | POA: Insufficient documentation

## 2016-07-26 DIAGNOSIS — I950 Idiopathic hypotension: Secondary | ICD-10-CM | POA: Insufficient documentation

## 2016-07-26 DIAGNOSIS — E162 Hypoglycemia, unspecified: Secondary | ICD-10-CM | POA: Insufficient documentation

## 2016-07-27 DIAGNOSIS — E1142 Type 2 diabetes mellitus with diabetic polyneuropathy: Secondary | ICD-10-CM | POA: Diagnosis not present

## 2016-07-27 DIAGNOSIS — I950 Idiopathic hypotension: Secondary | ICD-10-CM | POA: Diagnosis not present

## 2016-07-27 DIAGNOSIS — G4733 Obstructive sleep apnea (adult) (pediatric): Secondary | ICD-10-CM | POA: Diagnosis not present

## 2016-07-27 DIAGNOSIS — E7800 Pure hypercholesterolemia, unspecified: Secondary | ICD-10-CM | POA: Diagnosis not present

## 2016-07-27 DIAGNOSIS — I2510 Atherosclerotic heart disease of native coronary artery without angina pectoris: Secondary | ICD-10-CM | POA: Diagnosis not present

## 2016-07-27 DIAGNOSIS — E162 Hypoglycemia, unspecified: Secondary | ICD-10-CM | POA: Diagnosis not present

## 2016-07-27 DIAGNOSIS — I350 Nonrheumatic aortic (valve) stenosis: Secondary | ICD-10-CM | POA: Diagnosis not present

## 2016-07-27 DIAGNOSIS — J449 Chronic obstructive pulmonary disease, unspecified: Secondary | ICD-10-CM | POA: Diagnosis not present

## 2016-07-28 DIAGNOSIS — E1142 Type 2 diabetes mellitus with diabetic polyneuropathy: Secondary | ICD-10-CM | POA: Diagnosis not present

## 2016-07-28 DIAGNOSIS — I2510 Atherosclerotic heart disease of native coronary artery without angina pectoris: Secondary | ICD-10-CM | POA: Diagnosis not present

## 2016-07-28 DIAGNOSIS — J449 Chronic obstructive pulmonary disease, unspecified: Secondary | ICD-10-CM | POA: Diagnosis not present

## 2016-07-28 DIAGNOSIS — E162 Hypoglycemia, unspecified: Secondary | ICD-10-CM | POA: Diagnosis not present

## 2016-07-28 DIAGNOSIS — I350 Nonrheumatic aortic (valve) stenosis: Secondary | ICD-10-CM | POA: Diagnosis not present

## 2016-07-28 DIAGNOSIS — G4733 Obstructive sleep apnea (adult) (pediatric): Secondary | ICD-10-CM | POA: Diagnosis not present

## 2016-07-28 DIAGNOSIS — E7800 Pure hypercholesterolemia, unspecified: Secondary | ICD-10-CM | POA: Diagnosis not present

## 2016-07-28 DIAGNOSIS — I950 Idiopathic hypotension: Secondary | ICD-10-CM | POA: Diagnosis not present

## 2016-07-29 DIAGNOSIS — I350 Nonrheumatic aortic (valve) stenosis: Secondary | ICD-10-CM | POA: Diagnosis not present

## 2016-07-29 DIAGNOSIS — I950 Idiopathic hypotension: Secondary | ICD-10-CM | POA: Diagnosis not present

## 2016-07-29 DIAGNOSIS — J449 Chronic obstructive pulmonary disease, unspecified: Secondary | ICD-10-CM | POA: Diagnosis not present

## 2016-07-29 DIAGNOSIS — E162 Hypoglycemia, unspecified: Secondary | ICD-10-CM | POA: Diagnosis not present

## 2016-07-29 DIAGNOSIS — E7800 Pure hypercholesterolemia, unspecified: Secondary | ICD-10-CM | POA: Diagnosis not present

## 2016-07-29 DIAGNOSIS — G4733 Obstructive sleep apnea (adult) (pediatric): Secondary | ICD-10-CM | POA: Diagnosis not present

## 2016-07-29 DIAGNOSIS — E1142 Type 2 diabetes mellitus with diabetic polyneuropathy: Secondary | ICD-10-CM | POA: Diagnosis not present

## 2016-07-29 DIAGNOSIS — I2510 Atherosclerotic heart disease of native coronary artery without angina pectoris: Secondary | ICD-10-CM | POA: Diagnosis not present

## 2016-07-30 DIAGNOSIS — E162 Hypoglycemia, unspecified: Secondary | ICD-10-CM | POA: Diagnosis not present

## 2016-07-30 DIAGNOSIS — I950 Idiopathic hypotension: Secondary | ICD-10-CM | POA: Diagnosis not present

## 2016-07-30 DIAGNOSIS — E1142 Type 2 diabetes mellitus with diabetic polyneuropathy: Secondary | ICD-10-CM | POA: Diagnosis not present

## 2016-07-30 DIAGNOSIS — E7800 Pure hypercholesterolemia, unspecified: Secondary | ICD-10-CM | POA: Diagnosis not present

## 2016-07-30 DIAGNOSIS — I350 Nonrheumatic aortic (valve) stenosis: Secondary | ICD-10-CM | POA: Diagnosis not present

## 2016-07-30 DIAGNOSIS — G4733 Obstructive sleep apnea (adult) (pediatric): Secondary | ICD-10-CM | POA: Diagnosis not present

## 2016-07-30 DIAGNOSIS — J449 Chronic obstructive pulmonary disease, unspecified: Secondary | ICD-10-CM | POA: Diagnosis not present

## 2016-07-30 DIAGNOSIS — I2510 Atherosclerotic heart disease of native coronary artery without angina pectoris: Secondary | ICD-10-CM | POA: Diagnosis not present

## 2016-07-31 DIAGNOSIS — J811 Chronic pulmonary edema: Secondary | ICD-10-CM | POA: Diagnosis not present

## 2016-07-31 DIAGNOSIS — I350 Nonrheumatic aortic (valve) stenosis: Secondary | ICD-10-CM | POA: Diagnosis not present

## 2016-07-31 DIAGNOSIS — R918 Other nonspecific abnormal finding of lung field: Secondary | ICD-10-CM | POA: Diagnosis not present

## 2016-07-31 DIAGNOSIS — J449 Chronic obstructive pulmonary disease, unspecified: Secondary | ICD-10-CM | POA: Diagnosis not present

## 2016-07-31 DIAGNOSIS — I2510 Atherosclerotic heart disease of native coronary artery without angina pectoris: Secondary | ICD-10-CM | POA: Diagnosis not present

## 2016-08-01 DIAGNOSIS — N189 Chronic kidney disease, unspecified: Secondary | ICD-10-CM | POA: Diagnosis not present

## 2016-08-01 DIAGNOSIS — I2510 Atherosclerotic heart disease of native coronary artery without angina pectoris: Secondary | ICD-10-CM | POA: Diagnosis not present

## 2016-08-01 DIAGNOSIS — Z952 Presence of prosthetic heart valve: Secondary | ICD-10-CM

## 2016-08-01 DIAGNOSIS — J449 Chronic obstructive pulmonary disease, unspecified: Secondary | ICD-10-CM | POA: Diagnosis not present

## 2016-08-01 DIAGNOSIS — I350 Nonrheumatic aortic (valve) stenosis: Secondary | ICD-10-CM | POA: Diagnosis not present

## 2016-08-01 DIAGNOSIS — R918 Other nonspecific abnormal finding of lung field: Secondary | ICD-10-CM | POA: Diagnosis not present

## 2016-08-01 DIAGNOSIS — J90 Pleural effusion, not elsewhere classified: Secondary | ICD-10-CM | POA: Diagnosis not present

## 2016-08-01 DIAGNOSIS — Z006 Encounter for examination for normal comparison and control in clinical research program: Secondary | ICD-10-CM | POA: Diagnosis not present

## 2016-08-01 DIAGNOSIS — Z9981 Dependence on supplemental oxygen: Secondary | ICD-10-CM | POA: Diagnosis not present

## 2016-08-01 DIAGNOSIS — I10 Essential (primary) hypertension: Secondary | ICD-10-CM | POA: Diagnosis not present

## 2016-08-01 HISTORY — DX: Presence of prosthetic heart valve: Z95.2

## 2016-08-02 DIAGNOSIS — R5381 Other malaise: Secondary | ICD-10-CM | POA: Diagnosis not present

## 2016-08-02 DIAGNOSIS — Z9981 Dependence on supplemental oxygen: Secondary | ICD-10-CM | POA: Insufficient documentation

## 2016-08-02 DIAGNOSIS — G8918 Other acute postprocedural pain: Secondary | ICD-10-CM | POA: Insufficient documentation

## 2016-08-02 DIAGNOSIS — N184 Chronic kidney disease, stage 4 (severe): Secondary | ICD-10-CM | POA: Diagnosis not present

## 2016-08-02 DIAGNOSIS — I10 Essential (primary) hypertension: Secondary | ICD-10-CM | POA: Diagnosis not present

## 2016-08-02 DIAGNOSIS — J449 Chronic obstructive pulmonary disease, unspecified: Secondary | ICD-10-CM | POA: Diagnosis not present

## 2016-08-02 DIAGNOSIS — G4733 Obstructive sleep apnea (adult) (pediatric): Secondary | ICD-10-CM | POA: Diagnosis not present

## 2016-08-02 DIAGNOSIS — R918 Other nonspecific abnormal finding of lung field: Secondary | ICD-10-CM | POA: Diagnosis not present

## 2016-08-02 DIAGNOSIS — Z952 Presence of prosthetic heart valve: Secondary | ICD-10-CM | POA: Diagnosis not present

## 2016-08-02 HISTORY — DX: Other acute postprocedural pain: G89.18

## 2016-08-03 DIAGNOSIS — Z952 Presence of prosthetic heart valve: Secondary | ICD-10-CM | POA: Diagnosis not present

## 2016-08-09 DIAGNOSIS — Z952 Presence of prosthetic heart valve: Secondary | ICD-10-CM

## 2016-08-09 HISTORY — DX: Presence of prosthetic heart valve: Z95.2

## 2016-08-14 DIAGNOSIS — R809 Proteinuria, unspecified: Secondary | ICD-10-CM | POA: Diagnosis not present

## 2016-08-14 DIAGNOSIS — N179 Acute kidney failure, unspecified: Secondary | ICD-10-CM | POA: Diagnosis not present

## 2016-08-14 DIAGNOSIS — I129 Hypertensive chronic kidney disease with stage 1 through stage 4 chronic kidney disease, or unspecified chronic kidney disease: Secondary | ICD-10-CM | POA: Diagnosis not present

## 2016-08-14 DIAGNOSIS — N183 Chronic kidney disease, stage 3 (moderate): Secondary | ICD-10-CM | POA: Diagnosis not present

## 2016-08-14 DIAGNOSIS — E1122 Type 2 diabetes mellitus with diabetic chronic kidney disease: Secondary | ICD-10-CM | POA: Diagnosis not present

## 2016-08-14 DIAGNOSIS — I509 Heart failure, unspecified: Secondary | ICD-10-CM | POA: Diagnosis not present

## 2016-08-14 DIAGNOSIS — E782 Mixed hyperlipidemia: Secondary | ICD-10-CM | POA: Diagnosis not present

## 2016-08-14 DIAGNOSIS — E119 Type 2 diabetes mellitus without complications: Secondary | ICD-10-CM | POA: Diagnosis not present

## 2016-08-14 DIAGNOSIS — I10 Essential (primary) hypertension: Secondary | ICD-10-CM | POA: Diagnosis not present

## 2016-08-21 DIAGNOSIS — E119 Type 2 diabetes mellitus without complications: Secondary | ICD-10-CM | POA: Diagnosis not present

## 2016-08-21 DIAGNOSIS — J449 Chronic obstructive pulmonary disease, unspecified: Secondary | ICD-10-CM | POA: Diagnosis not present

## 2016-08-21 DIAGNOSIS — E782 Mixed hyperlipidemia: Secondary | ICD-10-CM | POA: Diagnosis not present

## 2016-08-21 DIAGNOSIS — R0602 Shortness of breath: Secondary | ICD-10-CM | POA: Diagnosis not present

## 2016-08-21 DIAGNOSIS — I10 Essential (primary) hypertension: Secondary | ICD-10-CM | POA: Diagnosis not present

## 2016-08-21 DIAGNOSIS — J9611 Chronic respiratory failure with hypoxia: Secondary | ICD-10-CM | POA: Diagnosis not present

## 2016-08-21 DIAGNOSIS — G4733 Obstructive sleep apnea (adult) (pediatric): Secondary | ICD-10-CM | POA: Diagnosis not present

## 2016-08-22 DIAGNOSIS — J449 Chronic obstructive pulmonary disease, unspecified: Secondary | ICD-10-CM | POA: Diagnosis not present

## 2016-08-27 DIAGNOSIS — J449 Chronic obstructive pulmonary disease, unspecified: Secondary | ICD-10-CM | POA: Diagnosis not present

## 2016-08-29 DIAGNOSIS — I2510 Atherosclerotic heart disease of native coronary artery without angina pectoris: Secondary | ICD-10-CM | POA: Diagnosis not present

## 2016-08-29 DIAGNOSIS — I10 Essential (primary) hypertension: Secondary | ICD-10-CM | POA: Diagnosis not present

## 2016-08-29 DIAGNOSIS — I359 Nonrheumatic aortic valve disorder, unspecified: Secondary | ICD-10-CM | POA: Diagnosis not present

## 2016-09-04 ENCOUNTER — Ambulatory Visit: Payer: PPO

## 2016-09-04 DIAGNOSIS — Z952 Presence of prosthetic heart valve: Secondary | ICD-10-CM | POA: Diagnosis not present

## 2016-09-04 DIAGNOSIS — R918 Other nonspecific abnormal finding of lung field: Secondary | ICD-10-CM | POA: Diagnosis not present

## 2016-09-04 DIAGNOSIS — Z4889 Encounter for other specified surgical aftercare: Secondary | ICD-10-CM | POA: Diagnosis not present

## 2016-09-05 ENCOUNTER — Encounter: Payer: Self-pay | Admitting: Urology

## 2016-09-05 ENCOUNTER — Ambulatory Visit (INDEPENDENT_AMBULATORY_CARE_PROVIDER_SITE_OTHER): Payer: PPO | Admitting: Urology

## 2016-09-05 VITALS — BP 112/66 | HR 84 | Ht 66.0 in | Wt 216.0 lb

## 2016-09-05 DIAGNOSIS — G4733 Obstructive sleep apnea (adult) (pediatric): Secondary | ICD-10-CM | POA: Diagnosis not present

## 2016-09-05 DIAGNOSIS — R350 Frequency of micturition: Secondary | ICD-10-CM

## 2016-09-05 DIAGNOSIS — E291 Testicular hypofunction: Secondary | ICD-10-CM

## 2016-09-05 DIAGNOSIS — E119 Type 2 diabetes mellitus without complications: Secondary | ICD-10-CM | POA: Diagnosis not present

## 2016-09-05 DIAGNOSIS — E782 Mixed hyperlipidemia: Secondary | ICD-10-CM | POA: Diagnosis not present

## 2016-09-05 DIAGNOSIS — N200 Calculus of kidney: Secondary | ICD-10-CM | POA: Diagnosis not present

## 2016-09-05 DIAGNOSIS — N401 Enlarged prostate with lower urinary tract symptoms: Secondary | ICD-10-CM

## 2016-09-05 DIAGNOSIS — I10 Essential (primary) hypertension: Secondary | ICD-10-CM | POA: Diagnosis not present

## 2016-09-05 NOTE — Progress Notes (Signed)
09/05/2016 2:48 PM   [name] [name] 1945/08/21 [id number]  Referring provider: Perrin Maltese, MD [address] [place], [place] [address]  Chief Complaint  Patient presents with  . Benign Prostatic Hypertrophy  . Hypogonadism    HPI:  F/u low T. Seen Jan 2018 with c/o fatigue, no energy, no initiative and weight gain. He has OSA and sleeps with a CPAP. He wears O2 and has COPD. A 04/14/2016 T was 204 and PSA 0.7. He has a normal libido. He has two kids. His DRE was normal Jan 2017. Repeat labs Jan 2018 showed total testosterone was 251 with a free testosterone of 28. His prolactin and estradiol were normal. 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 He started injections after considering the nature, r/b/a. A Mar 2018 T was 953 on injections.   He has occasional frequency, urgency, nocturia. He voids with a good flow. He saw Dr. Eliberto Ivory in the past for kidney stones. Now, he follows with nephrology. He drinks a lot of water. He voids with a good flow.   Today, he's off T replacement. 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 His recent H/H was 13.9/41 May 2018. He's recovering from that. He's been voiding frequently which is worse with diuretics. No straining. Good flow. Recently he was out mowing grass and bending some metal with his tools and noted some mild left flank pain. He had an Abd film and renal US Mar 2018 which showed a stable left UP stone. This compared favorably to CT in 2014. Pt has known about this stone and said it is in a "pocket" outside the kidney.   PMH: Past Medical History:  Diagnosis Date  . CHF (congestive heart failure) ([place])   . COPD (chronic obstructive pulmonary disease) ([place])   . Diabetes mellitus without complication ([place])   . Hypertension   . Kidney stones   . Renal disorder     Surgical History: Past Surgical History:  Procedure Laterality Date  . HERNIA  REPAIR    . LITHOTRIPSY      Home Medications:  Allergies as of 09/05/2016   No Known Allergies     Medication List       Accurate as of 09/05/16  2:48 PM. Always use your most recent med list.          acetaminophen 325 MG tablet Commonly known as:  TYLENOL Take 2 tablets (650 mg total) by mouth every 4 (four) hours as needed for mild pain (temp > 101.5).   aspirin 81 MG EC tablet Take 1 tablet (81 mg total) by mouth daily.   budesonide 0.5 MG/2ML nebulizer solution Commonly known as:  PULMICORT Take 2 mLs (0.5 mg total) by nebulization 2 (two) times daily.   citalopram 20 MG tablet Commonly known as:  CELEXA Take 1 tablet (20 mg total) by mouth daily.   famotidine 20 MG tablet Commonly known as:  PEPCID Take 1 tablet (20 mg total) by mouth at bedtime.   feeding supplement (GLUCERNA SHAKE) Liqd Take 237 mLs by mouth 3 (three) times daily between meals.   fluticasone 50 MCG/ACT nasal spray Commonly known as:  FLONASE Place 2 sprays into both nostrils daily.   furosemide 40 MG tablet Commonly known as:  LASIX Take 1 tablet (40 mg total) by mouth 2 (two) times daily.   gabapentin 100 MG capsule Commonly known as:  NEURONTIN Take 2 capsules (200 mg  total) by mouth 2 (two) times daily.   heparin 5000 UNIT/ML injection Inject 1 mL (5,000 Units total) into the skin every 8 (eight) hours.   insulin aspart 100 UNIT/ML injection Commonly known as:  novoLOG Inject 0-9 Units into the skin every 4 (four) hours.   ipratropium-albuterol 0.5-2.5 (3) MG/3ML Soln Commonly known as:  DUONEB Take 3 mLs by nebulization every 6 (six) hours.   LORazepam 2 MG/ML injection Commonly known as:  ATIVAN Inject 0.25 mLs (0.5 mg total) into the vein every 6 (six) hours as needed for anxiety.   methylPREDNISolone sodium succinate 40 mg/mL injection Commonly known as:  SOLU-MEDROL Inject 1 mL (40 mg total) into the vein every 12 (twelve) hours.   metoCLOPramide 5 MG/ML  injection Commonly known as:  REGLAN Inject 1 mL (5 mg total) into the vein every 8 (eight) hours.   morphine 4 MG/ML injection Inject 0.5 mLs (2 mg total) into the vein every 2 (two) hours as needed for moderate pain or severe pain (anxiety).   ondansetron 4 MG/2ML Soln injection Commonly known as:  ZOFRAN Inject 2 mLs (4 mg total) into the vein every 6 (six) hours as needed for nausea.   sodium chloride 0.65 % Soln nasal spray Commonly known as:  OCEAN Place 1 spray into both nostrils as needed for congestion.   sodium chloride flush 0.9 % Soln Commonly known as:  NS Inject 3 mLs into the vein every 12 (twelve) hours.   sodium chloride flush 0.9 % Soln Commonly known as:  NS Inject 3 mLs into the vein as needed.   tamsulosin 0.4 MG Caps capsule Commonly known as:  FLOMAX Take 1 capsule (0.4 mg total) by mouth daily.       Allergies: No Known Allergies  Family History: No family history on file.  Social History:  reports that he has quit smoking. He quit smokeless tobacco use about 12 years ago. He reports that he does not drink alcohol. His drug history is not on file.  ROS:                                        Physical Exam: There were no vitals taken for this visit.  Constitutional:  Alert and oriented, No acute distress. HEENT: [place] AT, moist mucus membranes.  Trachea midline, no masses. Cardiovascular: No clubbing, cyanosis, or edema. Respiratory: Normal respiratory effort, no increased work of breathing. Neurologic: Grossly intact, no focal deficits, moving all 4 extremities. Psychiatric: Normal mood and affect. GU: no CVAT  Laboratory Data: Lab Results  Component Value Date   WBC 5.7 07/24/2016   HGB 12.1 (L) 07/24/2016   HCT 34.8 (L) 07/24/2016   MCV 96.7 07/24/2016   PLT 191 07/24/2016    Lab Results  Component Value Date   CREATININE 2.91 (H) 07/24/2016    No results found for: PSA  Lab Results  Component Value  Date   TESTOSTERONE 953 (H) 07/04/2016    Lab Results  Component Value Date   HGBA1C 6.2 07/26/2012    Urinalysis    Component Value Date/Time   COLORURINE AMBER (A) 07/24/2016 2146   APPEARANCEUR CLOUDY (A) 07/24/2016 2146   APPEARANCEUR Clear 07/27/2014 1247   LABSPEC 1.013 07/24/2016 2146   LABSPEC 1.016 07/27/2014 1247   PHURINE 5.0 07/24/2016 2146   GLUCOSEU NEGATIVE 07/24/2016 2146   GLUCOSEU 50 mg/dL 07/27/2014 1247  HGBUR NEGATIVE 07/24/2016 2146   [place] NEGATIVE 07/24/2016 2146   BILIRUBINUR Negative 07/27/2014 [place] 07/24/2016 2146   PROTEINUR 100 (A) 07/24/2016 2146   NITRITE NEGATIVE 07/24/2016 2146   LEUKOCYTESUR NEGATIVE 07/24/2016 2146   LEUKOCYTESUR Negative 07/27/2014 1247   Imaging: reviewed KUB, [place] and CT images   Assessment & Plan:    1) low T - given the recent CV events, he'll stay off T replacement for now.   2) BPH - stable.   3) left renal stone - stable    There are no diagnoses linked to this encounter.  No Follow-up on file.  Festus Aloe, [facility] [address], [place], [place] [address] [phone]

## 2016-09-12 DIAGNOSIS — I1310 Hypertensive heart and chronic kidney disease without heart failure, with stage 1 through stage 4 chronic kidney disease, or unspecified chronic kidney disease: Secondary | ICD-10-CM | POA: Diagnosis not present

## 2016-09-12 DIAGNOSIS — I081 Rheumatic disorders of both mitral and tricuspid valves: Secondary | ICD-10-CM | POA: Diagnosis not present

## 2016-09-12 DIAGNOSIS — Z952 Presence of prosthetic heart valve: Secondary | ICD-10-CM | POA: Diagnosis not present

## 2016-09-12 DIAGNOSIS — I2510 Atherosclerotic heart disease of native coronary artery without angina pectoris: Secondary | ICD-10-CM | POA: Diagnosis not present

## 2016-09-12 DIAGNOSIS — Z7902 Long term (current) use of antithrombotics/antiplatelets: Secondary | ICD-10-CM | POA: Diagnosis not present

## 2016-09-12 DIAGNOSIS — E782 Mixed hyperlipidemia: Secondary | ICD-10-CM | POA: Diagnosis not present

## 2016-09-12 DIAGNOSIS — N184 Chronic kidney disease, stage 4 (severe): Secondary | ICD-10-CM | POA: Diagnosis not present

## 2016-09-12 DIAGNOSIS — E1122 Type 2 diabetes mellitus with diabetic chronic kidney disease: Secondary | ICD-10-CM | POA: Diagnosis not present

## 2016-09-12 DIAGNOSIS — I10 Essential (primary) hypertension: Secondary | ICD-10-CM | POA: Diagnosis not present

## 2016-09-22 DIAGNOSIS — J449 Chronic obstructive pulmonary disease, unspecified: Secondary | ICD-10-CM | POA: Diagnosis not present

## 2016-09-27 DIAGNOSIS — J449 Chronic obstructive pulmonary disease, unspecified: Secondary | ICD-10-CM | POA: Diagnosis not present

## 2016-10-02 ENCOUNTER — Encounter: Payer: PPO | Attending: Cardiovascular Disease | Admitting: *Deleted

## 2016-10-02 VITALS — Ht 66.0 in | Wt 209.4 lb

## 2016-10-02 DIAGNOSIS — Z954 Presence of other heart-valve replacement: Secondary | ICD-10-CM | POA: Insufficient documentation

## 2016-10-02 DIAGNOSIS — Z952 Presence of prosthetic heart valve: Secondary | ICD-10-CM

## 2016-10-02 NOTE — Progress Notes (Signed)
Cardiac Individual Treatment Plan  Patient Details  Name: [name] [name] MRN: [id number] Date of Birth: October 21, 1945 Referring Provider:     Cardiac Rehab from 10/02/2016 in [facility] Cardiac and Pulmonary Rehab  Referring Provider  [name] MD      Initial Encounter Date:    Cardiac Rehab from 10/02/2016 in [facility] Cardiac and Pulmonary Rehab  Date  10/02/16  Referring Provider  [name] MD    10/02/2016  Visit Diagnosis: S/P TAVR (transcatheter aortic valve replacement)  Patient's Home Medications on Admission:  Current Outpatient Prescriptions:  .  acetaminophen (TYLENOL) 325 MG tablet, Take 2 tablets (650 mg total) by mouth every 4 (four) hours as needed for mild pain (temp > 101.5)., Disp: , Rfl:  .  Ascorbic Acid (VITAMIN C) 1000 MG tablet, Take 1,000 mg by mouth daily., Disp: , Rfl:  .  aspirin EC 81 MG EC tablet, Take 1 tablet (81 mg total) by mouth daily., Disp: , Rfl:  .  budesonide (PULMICORT) 0.5 MG/2ML nebulizer solution, Take 2 mLs (0.5 mg total) by nebulization 2 (two) times daily. (Patient not taking: Reported on 09/05/2016), Disp: , Rfl: 12 .  citalopram (CELEXA) 20 MG tablet, Take 1 tablet (20 mg total) by mouth daily., Disp: , Rfl:  .  clopidogrel (PLAVIX) 75 MG tablet, Take 75 mg by mouth daily., Disp: , Rfl:  .  famotidine (PEPCID) 20 MG tablet, Take 1 tablet (20 mg total) by mouth at bedtime. (Patient not taking: Reported on 09/05/2016), Disp: , Rfl:  .  fenofibrate 160 MG tablet, Take 160 mg by mouth daily., Disp: , Rfl:  .  fluticasone (FLONASE) 50 MCG/ACT nasal spray, Place 2 sprays into both nostrils daily., Disp: , Rfl: 2 .  furosemide (LASIX) 40 MG tablet, Take 1 tablet (40 mg total) by mouth 2 (two) times daily. (Patient not taking: Reported on 09/05/2016), Disp: 30 tablet, Rfl:  .  gabapentin (NEURONTIN) 100 MG capsule, Take 2 capsules (200 mg total) by mouth 2 (two) times daily., Disp: , Rfl:  .  heparin 5000 UNIT/ML injection, Inject 1 mL (5,000  Units total) into the skin every 8 (eight) hours. (Patient not taking: Reported on 09/05/2016), Disp: 1 mL, Rfl:  .  insulin aspart (NOVOLOG) 100 UNIT/ML injection, Inject 0-9 Units into the skin every 4 (four) hours. (Patient not taking: Reported on 09/05/2016), Disp: 10 mL, Rfl: 11 .  ipratropium-albuterol (DUONEB) 0.5-2.5 (3) MG/3ML SOLN, Take 3 mLs by nebulization every 6 (six) hours., Disp: 360 mL, Rfl:  .  methocarbamol (ROBAXIN) 750 MG tablet, Take 750 mg by mouth 4 (four) times daily., Disp: , Rfl:  .  metoprolol succinate (TOPROL-XL) 50 MG 24 hr tablet, Take 50 mg by mouth daily. Take with or immediately following a meal., Disp: , Rfl:  .  pantoprazole (PROTONIX) 40 MG tablet, Take 40 mg by mouth daily., Disp: , Rfl:  .  Potassium 95 MG TABS, Take by mouth., Disp: , Rfl:  .  sodium chloride (OCEAN) 0.65 % SOLN nasal spray, Place 1 spray into both nostrils as needed for congestion., Disp: , Rfl: 0 .  tamsulosin (FLOMAX) 0.4 MG CAPS capsule, Take 1 capsule (0.4 mg total) by mouth daily., Disp: 30 capsule, Rfl:  .  torsemide (DEMADEX) 20 MG tablet, Take 20 mg by mouth daily., Disp: , Rfl:   Past Medical History: Past Medical History:  Diagnosis Date  . CHF (congestive heart failure) ([place])   . COPD (chronic obstructive pulmonary disease) ([place])   .  Diabetes mellitus without complication ([place])   . Hypertension   . Kidney stones   . Renal disorder     Tobacco Use: History  Smoking Status  . Former Smoker  Smokeless Tobacco  . Former Systems developer  . Quit date: 05/16/2004    Labs: Recent Review Flowsheet Data    Labs for ITP Cardiac and Pulmonary Rehab Latest Ref Rng & Units 07/26/2012 07/05/2016 07/06/2016   Cholestrol 0 - 200 mg/dL 100 - -   LDLCALC 0 - 100 mg/dL 34 - -   HDL 40 - 60 mg/dL 35(L) - -   Trlycerides 0 - 200 mg/dL 157 - -   Hemoglobin A1c 4.2 - 6.3 % 6.2 - -   PHART 7.350 - 7.450 - 7.35 7.35   PCO2ART 32.0 - 48.0 mmHg - 49(H) 48   HCO3 20.0 - 28.0 mmol/L - 27.1 26.5   O2SAT  % - 94.7 96.4       Exercise Target Goals: Date: 10/02/16  Exercise Program Goal: Individual exercise prescription set with THRR, safety & activity barriers. Participant demonstrates ability to understand and report RPE using BORG scale, to self-measure pulse accurately, and to acknowledge the importance of the exercise prescription.  Exercise Prescription Goal: Starting with aerobic activity 30 plus minutes a day, 3 days per week for initial exercise prescription. Provide home exercise prescription and guidelines that participant acknowledges understanding prior to discharge.  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 HR 119 bpm     Max Ex. BP 146/74     2 Minute Post BP 128/64       Interval HR   Baseline HR 83     1 Minute HR 87     2 Minute HR 85     3 Minute HR 87     4 Minute HR 83     5 Minute HR 84     6 Minute HR 86     2 Minute Post HR 88     Interval Heart Rate? Yes       Interval Oxygen   Interval Oxygen? Yes     Baseline Oxygen Saturation % 94 %  95% on Room Air     Baseline Liters of Oxygen 2 L     1 Minute Oxygen Saturation % 87 %     1 Minute Liters of Oxygen 0 L  Room Air     2 Minute Oxygen Saturation % 85 %  2 Minute Liters of Oxygen 0 L     3 Minute Oxygen Saturation % 87  %     3 Minute Liters of Oxygen 0 L     4 Minute Oxygen Saturation % 83 %     4 Minute Liters of Oxygen 0 L     5 Minute Oxygen Saturation % 84 %     5 Minute Liters of Oxygen 0 L     6 Minute Oxygen Saturation % 86 %     6 Minute Liters of Oxygen 0 L     2 Minute Post Oxygen Saturation % 95 %     2 Minute Post Liters of Oxygen 0 L        Oxygen Initial Assessment:   Oxygen Re-Evaluation:   Oxygen Discharge (Final Oxygen Re-Evaluation):   Initial Exercise Prescription:     Initial Exercise Prescription - 10/02/16 1400      Date of Initial Exercise RX and Referring Provider   Date 10/02/16   Referring Provider [name] MD     Oxygen   Oxygen Continuous   Liters 2     Treadmill   MPH 1.6   Grade 0.5   Minutes 15   METs 2.34     Recumbant Bike   Level 1   RPM 25   Watts 7   Minutes 15   METs 2.2     T5 Nustep   Level 1   SPM 80   Minutes 15   METs 2     Prescription Details   Frequency (times per week) 2   Duration Progress to 45 minutes of aerobic exercise without signs/symptoms of physical distress     Intensity   THRR 40-80% of Max Heartrate 109-135   Ratings of Perceived Exertion 11-13   Perceived Dyspnea 0-4     Progression   Progression Continue to progress workloads to maintain intensity without signs/symptoms of physical distress.     Resistance Training   Training Prescription Yes   Weight 3 lbs   Reps 10-15      Perform Capillary Blood Glucose checks as needed.  Exercise Prescription Changes:      Exercise Prescription Changes    Row Name 10/02/16 1400             Response to Exercise   Blood Pressure (Admit) 146/74       Blood Pressure (Exercise) 146/74       Blood Pressure (Exit) 128/64       Heart Rate (Admit) 83 bpm       Heart Rate (Exercise) 119 bpm       Heart Rate (Exit) 88 bpm       Oxygen Saturation (Admit) 94 %       Oxygen Saturation (Exercise) 83 %       Oxygen Saturation (Exit) 95 %       Rating  of Perceived Exertion (Exercise) 9       Perceived Dyspnea (Exercise) 3       Symptoms SOB, knees felt weak       Comments walk test results          Exercise Comments:   Exercise Goals and Review:      Exercise Goals    Row Name 10/02/16 1445             Exercise Goals   Increase Physical Activity Yes       Intervention Provide advice, education, support and  counseling about physical activity/exercise needs.;Develop an individualized exercise prescription for aerobic and resistive training based on initial evaluation findings, risk stratification, comorbidities and participant's personal goals.       Expected Outcomes Achievement of increased cardiorespiratory fitness and enhanced flexibility, muscular endurance and strength shown through measurements of functional capacity and personal statement of participant.       Increase Strength and Stamina Yes       Intervention Provide advice, education, support and counseling about physical activity/exercise needs.;Develop an individualized exercise prescription for aerobic and resistive training based on initial evaluation findings, risk stratification, comorbidities and participant's personal goals.       Expected Outcomes Achievement of increased cardiorespiratory fitness and enhanced flexibility, muscular endurance and strength shown through measurements of functional capacity and personal statement of participant.          Exercise Goals Re-Evaluation :   Discharge Exercise Prescription (Final Exercise Prescription Changes):     Exercise Prescription Changes - 10/02/16 1400      Response to Exercise   Blood Pressure (Admit) 146/74   Blood Pressure (Exercise) 146/74   Blood Pressure (Exit) 128/64   Heart Rate (Admit) 83 bpm   Heart Rate (Exercise) 119 bpm   Heart Rate (Exit) 88 bpm   Oxygen Saturation (Admit) 94 %   Oxygen Saturation (Exercise) 83 %   Oxygen Saturation (Exit) 95 %   Rating of Perceived Exertion  (Exercise) 9   Perceived Dyspnea (Exercise) 3   Symptoms SOB, knees felt weak   Comments walk test results      Nutrition:  Target Goals: Understanding of nutrition guidelines, daily intake of sodium 1500mg , cholesterol 200mg , calories 30% from fat and 7% or less from saturated fats, daily to have 5 or more servings of fruits and vegetables.  Biometrics:     Pre Biometrics - 10/02/16 1445      Pre Biometrics   Height 5\' 6"  (1.676 m)   Weight 209 lb 6.4 oz (95 kg)   Waist Circumference 44 inches   Hip Circumference 39 inches   Waist to Hip Ratio 1.13 %   BMI (Calculated) 33.9   Single Leg Stand 30 seconds       Nutrition Therapy Plan and Nutrition Goals:   Nutrition Discharge: Rate Your Plate Scores:     Nutrition Assessments - 10/02/16 1337      MEDFICTS Scores   Pre Score 41      Nutrition Goals Re-Evaluation:   Nutrition Goals Discharge (Final Nutrition Goals Re-Evaluation):   Psychosocial: Target Goals: Acknowledge presence or absence of significant depression and/or stress, maximize coping skills, provide positive support system. Participant is able to verbalize types and ability to use techniques and skills needed for reducing stress and depression.   Initial Review & Psychosocial Screening:     Initial Psych Review & Screening - 10/02/16 1359      Screening Interventions   Interventions Encouraged to exercise      Quality of Life Scores:      Quality of Life - 10/02/16 1336      Quality of Life Scores   Health/Function Pre 26.54 %   Socioeconomic Pre 26.64 %   Psych/Spiritual Pre 27.5 %   Family Pre 28.13 %   GLOBAL Pre 26.97 %      PHQ-9: Recent Review Flowsheet Data    Depression screen [facility] 2/9 10/02/2016   Decreased Interest 0   Down, Depressed, Hopeless 0   PHQ - 2 Score 0  Altered sleeping 0   Tired, decreased energy 0   Change in appetite 0   Feeling bad or failure about yourself  0   Trouble concentrating 0   Moving  slowly or fidgety/restless 0   Suicidal thoughts 0   PHQ-9 Score 0     Interpretation of Total Score  Total Score Depression Severity:  1-4 = Minimal depression, 5-9 = Mild depression, 10-14 = Moderate depression, 15-19 = Moderately severe depression, 20-27 = Severe depression   Psychosocial Evaluation and Intervention:   Psychosocial Re-Evaluation:   Psychosocial Discharge (Final Psychosocial Re-Evaluation):   Vocational Rehabilitation: Provide vocational rehab assistance to qualifying candidates.   Vocational Rehab Evaluation & Intervention:     Vocational Rehab - 10/02/16 1351      Initial Vocational Rehab Evaluation & Intervention   Assessment shows need for Vocational Rehabilitation No      Education: Education Goals: Education classes will be provided on a weekly basis, covering required topics. Participant will state understanding/return demonstration of topics presented.  Learning Barriers/Preferences:     Learning Barriers/Preferences - 10/02/16 1349      Learning Barriers/Preferences   Learning Barriers Hearing   Learning Preferences Individual Instruction;Verbal Instruction      Education Topics: General Nutrition Guidelines/Fats and Fiber: -Group instruction provided by verbal, written material, models and posters to present the general guidelines for heart healthy nutrition. Gives an explanation and review of dietary fats and fiber.   Controlling Sodium/Reading Food Labels: -Group verbal and written material supporting the discussion of sodium use in heart healthy nutrition. Review and explanation with models, verbal and written materials for utilization of the food label.   Exercise Physiology & Risk Factors: - Group verbal and written instruction with models to review the exercise physiology of the cardiovascular system and associated critical values. Details cardiovascular disease risk factors and the goals associated with each risk  factor.   Aerobic Exercise & Resistance Training: - Gives group verbal and written discussion on the health impact of inactivity. On the components of aerobic and resistive training programs and the benefits of this training and how to safely progress through these programs.   Flexibility, Balance, General Exercise Guidelines: - Provides group verbal and written instruction on the benefits of flexibility and balance training programs. Provides general exercise guidelines with specific guidelines to those with heart or lung disease. Demonstration and skill practice provided.   Stress Management: - Provides group verbal and written instruction about the health risks of elevated stress, cause of high stress, and healthy ways to reduce stress.   Depression: - Provides group verbal and written instruction on the correlation between heart/lung disease and depressed mood, treatment options, and the stigmas associated with seeking treatment.   Anatomy & Physiology of the Heart: - Group verbal and written instruction and models provide basic cardiac anatomy and physiology, with the coronary electrical and arterial systems. Review of: AMI, Angina, Valve disease, Heart Failure, Cardiac Arrhythmia, Pacemakers, and the ICD.   Cardiac Procedures: - Group verbal and written instruction and models to describe the testing methods done to diagnose heart disease. Reviews the outcomes of the test results. Describes the treatment choices: Medical Management, Angioplasty, or Coronary Bypass Surgery.   Cardiac Medications: - Group verbal and written instruction to review commonly prescribed medications for heart disease. Reviews the medication, class of the drug, and side effects. Includes the steps to properly store meds and maintain the prescription regimen.   Go Sex-Intimacy & Heart Disease, Get SMART - Goal  Setting: - Group verbal and written instruction through game format to discuss heart disease and  the return to sexual intimacy. Provides group verbal and written material to discuss and apply goal setting through the application of the S.M.A.R.T. Method.   Other Matters of the Heart: - Provides group verbal, written materials and models to describe Heart Failure, Angina, Valve Disease, and Diabetes in the realm of heart disease. Includes description of the disease process and treatment options available to the cardiac patient.   Exercise & Equipment Safety: - Individual verbal instruction and demonstration of equipment use and safety with use of the equipment.   Cardiac Rehab from 10/02/2016 in [facility] Cardiac and Pulmonary Rehab  Date  10/02/16  Educator  C. EnterkinRN  Instruction Review Code  1- partially meets, needs review/practice      Infection Prevention: - Provides verbal and written material to individual with discussion of infection control including proper hand washing and proper equipment cleaning during exercise session.   Cardiac Rehab from 10/02/2016 in [facility] Cardiac and Pulmonary Rehab  Date  10/02/16  Educator  C. Loys Hoselton, RN  Instruction Review Code  2- meets goals/outcomes      Falls Prevention: - Provides verbal and written material to individual with discussion of falls prevention and safety.   Cardiac Rehab from 10/02/2016 in [facility] Cardiac and Pulmonary Rehab  Date  10/02/16  Educator  C. Cherilynn Schomburg,RN  Instruction Review Code  2- meets goals/outcomes      Diabetes: - Individual verbal and written instruction to review signs/symptoms of diabetes, desired ranges of glucose level fasting, after meals and with exercise. Advice that pre and post exercise glucose checks will be done for 3 sessions at entry of program.   Cardiac Rehab from 10/02/2016 in [facility] Cardiac and Pulmonary Rehab  Date  10/02/16  Educator  C. Rilley Stash,RN  Instruction Review Code  1- partially meets, needs review/practice       Knowledge Questionnaire Score:     Knowledge Questionnaire  Score - 10/02/16 1337      Knowledge Questionnaire Score   Pre Score 21      Core Components/Risk Factors/Patient Goals at Admission:     Personal Goals and Risk Factors at Admission - 10/02/16 1358      Core Components/Risk Factors/Patient Goals on Admission    Weight Management Yes;Obesity;Weight Loss   Intervention Weight Management: Develop a combined nutrition and exercise program designed to reach desired caloric intake, while maintaining appropriate intake of nutrient and fiber, sodium and fats, and appropriate energy expenditure required for the weight goal.;Weight Management: Provide education and appropriate resources to help participant work on and attain dietary goals.;Weight Management/Obesity: Establish reasonable short term and long term weight goals.;Obesity: Provide education and appropriate resources to help participant work on and attain dietary goals.   Admit Weight 176 lb 12.8 oz (80.2 kg)   Goal Weight: Short Term 171 lb (77.6 kg)   Goal Weight: Long Term 166 lb (75.3 kg)   Expected Outcomes Short Term: Continue to assess and modify interventions until short term weight is achieved;Long Term: Adherence to nutrition and physical activity/exercise program aimed toward attainment of established weight goal;Weight Loss: Understanding of general recommendations for a balanced deficit meal plan, which promotes 1-2 lb weight loss per week and includes a negative energy balance of [phone] kcal/d;Understanding recommendations for meals to include 15-35% energy as protein, 25-35% energy from fat, 35-60% energy from carbohydrates, less than 200mg  of dietary cholesterol, 20-35 gm of total fiber daily;Understanding of  distribution of calorie intake throughout the day with the consumption of 4-5 meals/snacks   Diabetes Yes   Intervention Provide education about signs/symptoms and action to take for hypo/hyperglycemia.;Provide education about proper nutrition, including hydration, and  aerobic/resistive exercise prescription along with prescribed medications to achieve blood glucose in normal ranges: Fasting glucose 65-99 mg/dL   Expected Outcomes Short Term: Participant verbalizes understanding of the signs/symptoms and immediate care of hyper/hypoglycemia, proper foot care and importance of medication, aerobic/resistive exercise and nutrition plan for blood glucose control.;Long Term: Attainment of HbA1C < 7%.   Hypertension Yes   Intervention Provide education on lifestyle modifcations including regular physical activity/exercise, weight management, moderate sodium restriction and increased consumption of fresh fruit, vegetables, and low fat dairy, alcohol moderation, and smoking cessation.;Monitor prescription use compliance.   Expected Outcomes Short Term: Continued assessment and intervention until BP is < 140/37mm HG in hypertensive participants. < 130/49mm HG in hypertensive participants with diabetes, heart failure or chronic kidney disease.;Long Term: Maintenance of blood pressure at goal levels.   Lipids Yes   Intervention Provide education and support for participant on nutrition & aerobic/resistive exercise along with prescribed medications to achieve LDL 70mg , HDL >40mg .   Expected Outcomes Short Term: Participant states understanding of desired cholesterol values and is compliant with medications prescribed. Participant is following exercise prescription and nutrition guidelines.;Long Term: Cholesterol controlled with medications as prescribed, with individualized exercise RX and with personalized nutrition plan. Value goals: LDL < 70mg , HDL > 40 mg.      Core Components/Risk Factors/Patient Goals Review:    Core Components/Risk Factors/Patient Goals at Discharge (Final Review):    ITP Comments:     ITP Comments    Row Name 10/02/16 1357           ITP Comments ITP Created after Cardiac Rehab informed consent signed during the Medical Review.            Comments: [name] [name]" is ready to start Cardiac Rehab. [name] [name] was in Cardiac Rehab he said about 3 years ago. [name] [name] said his blood sugars run 100-150 and his last HBA1c I saw was 5.3!

## 2016-10-02 NOTE — Patient Instructions (Addendum)
Patient Instructions  Patient Details  Name: [name] [name] MRN: [id number] Date of Birth: March 17, 1946 Referring Provider:  Dionisio David, MD  Below are the personal goals you chose as well as exercise and nutrition goals. Our goal is to help you keep on track towards obtaining and maintaining your goals. We will be discussing your progress on these goals with you throughout the program.  Initial Exercise Prescription:     Initial Exercise Prescription - 10/02/16 1400      Date of Initial Exercise RX and Referring Provider   Date 10/02/16   Referring Provider [name] MD     Oxygen   Oxygen Continuous   Liters 2     Treadmill   MPH 1.6   Grade 0.5   Minutes 15   METs 2.34     Recumbant Bike   Level 1   RPM 25   Watts 7   Minutes 15   METs 2.2     T5 Nustep   Level 1   SPM 80   Minutes 15   METs 2     Prescription Details   Frequency (times per week) 2   Duration Progress to 45 minutes of aerobic exercise without signs/symptoms of physical distress     Intensity   THRR 40-80% of Max Heartrate 109-135   Ratings of Perceived Exertion 11-13   Perceived Dyspnea 0-4     Progression   Progression Continue to progress workloads to maintain intensity without signs/symptoms of physical distress.     Resistance Training   Training Prescription Yes   Weight 3 lbs   Reps 10-15      Exercise Goals: Frequency: Be able to perform aerobic exercise three times per week working toward 3-5 days per week.  Intensity: Work with a perceived exertion of 11 (fairly light) - 15 (hard) as tolerated. Follow your new exercise prescription and watch for changes in prescription as you progress with the program. Changes will be reviewed with you when they are made.  Duration: You should be able to do 30 minutes of continuous aerobic exercise in addition to a 5 minute warm-up and a 5 minute cool-down routine.  Nutrition Goals: Your personal nutrition goals will be  established when you do your nutrition analysis with the dietician.  The following are nutrition guidelines to follow: Cholesterol < 200mg /day Sodium < 1500mg /day Fiber: Men over 50 yrs - 30 grams per day  Personal Goals:     Personal Goals and Risk Factors at Admission - 10/02/16 1358      Core Components/Risk Factors/Patient Goals on Admission    Weight Management Yes;Obesity;Weight Loss   Intervention Weight Management: Develop a combined nutrition and exercise program designed to reach desired caloric intake, while maintaining appropriate intake of nutrient and fiber, sodium and fats, and appropriate energy expenditure required for the weight goal.;Weight Management: Provide education and appropriate resources to help participant work on and attain dietary goals.;Weight Management/Obesity: Establish reasonable short term and long term weight goals.;Obesity: Provide education and appropriate resources to help participant work on and attain dietary goals.   Admit Weight 176 lb 12.8 oz (80.2 kg)   Goal Weight: Short Term 171 lb (77.6 kg)   Goal Weight: Long Term 166 lb (75.3 kg)   Expected Outcomes Short Term: Continue to assess and modify interventions until short term weight is achieved;Long Term: Adherence to nutrition and physical activity/exercise program aimed toward attainment of established weight goal;Weight Loss: Understanding of general recommendations for a  balanced deficit meal plan, which promotes 1-2 lb weight loss per week and includes a negative energy balance of [phone] kcal/d;Understanding recommendations for meals to include 15-35% energy as protein, 25-35% energy from fat, 35-60% energy from carbohydrates, less than 200mg  of dietary cholesterol, 20-35 gm of total fiber daily;Understanding of distribution of calorie intake throughout the day with the consumption of 4-5 meals/snacks   Diabetes Yes   Intervention Provide education about signs/symptoms and action to take for  hypo/hyperglycemia.;Provide education about proper nutrition, including hydration, and aerobic/resistive exercise prescription along with prescribed medications to achieve blood glucose in normal ranges: Fasting glucose 65-99 mg/dL   Expected Outcomes Short Term: Participant verbalizes understanding of the signs/symptoms and immediate care of hyper/hypoglycemia, proper foot care and importance of medication, aerobic/resistive exercise and nutrition plan for blood glucose control.;Long Term: Attainment of HbA1C < 7%.   Hypertension Yes   Intervention Provide education on lifestyle modifcations including regular physical activity/exercise, weight management, moderate sodium restriction and increased consumption of fresh fruit, vegetables, and low fat dairy, alcohol moderation, and smoking cessation.;Monitor prescription use compliance.   Expected Outcomes Short Term: Continued assessment and intervention until BP is < 140/46mm HG in hypertensive participants. < 130/30mm HG in hypertensive participants with diabetes, heart failure or chronic kidney disease.;Long Term: Maintenance of blood pressure at goal levels.   Lipids Yes   Intervention Provide education and support for participant on nutrition & aerobic/resistive exercise along with prescribed medications to achieve LDL 70mg , HDL >40mg .   Expected Outcomes Short Term: Participant states understanding of desired cholesterol values and is compliant with medications prescribed. Participant is following exercise prescription and nutrition guidelines.;Long Term: Cholesterol controlled with medications as prescribed, with individualized exercise RX and with personalized nutrition plan. Value goals: LDL < 70mg , HDL > 40 mg.      Tobacco Use Initial Evaluation: History  Smoking Status  . Former Smoker  Smokeless Tobacco  . Former Systems developer  . Quit date: 05/16/2004    Copy of goals given to participant.

## 2016-10-02 NOTE — Progress Notes (Signed)
Daily Session Note  Patient Details  Name: [name] [name] MRN: [id number] Date of Birth: Nov 27, 1945 Referring Provider:    Encounter Date: 10/02/2016  Check In:     Session Check In - 10/02/16 1342      Check-In   Location [facility]   Staff Present Alberteen Sam, MA, ACSM RCEP, Exercise Physiologist;Jazmyn Offner, RN, BSN;Susanne Bice, RN, BSN, [employer] physician immediately available to respond to emergencies See telemetry face sheet for immediately available ER MD   Medication changes reported     No   Fall or balance concerns reported    No   Tobacco Cessation No Change   Warm-up and Cool-down Performed as group-led instruction   Resistance Training Performed Yes   VAD Patient? No     Pain Assessment   Currently in Pain? No/denies         History  Smoking Status  . Former Smoker  Smokeless Tobacco  . Former Systems developer  . Quit date: 05/16/2004    Goals Met:  Proper associated with RPD/PD & O2 Sat Exercise tolerated well Personal goals reviewed No report of cardiac concerns or symptoms  Goals Unmet:  Not Applicable  Comments:     Dr. Emily Filbert is Medical Director for [place] and [facility].

## 2016-10-05 ENCOUNTER — Encounter: Payer: PPO | Admitting: *Deleted

## 2016-10-05 DIAGNOSIS — Z954 Presence of other heart-valve replacement: Secondary | ICD-10-CM | POA: Diagnosis not present

## 2016-10-05 DIAGNOSIS — Z952 Presence of prosthetic heart valve: Secondary | ICD-10-CM

## 2016-10-05 LAB — GLUCOSE, CAPILLARY
GLUCOSE-CAPILLARY: 224 mg/dL — AB (ref 65–99)
GLUCOSE-CAPILLARY: 97 mg/dL (ref 65–99)

## 2016-10-05 NOTE — Progress Notes (Signed)
Daily Session Note  Patient Details  Name: [name] [name] MRN: [id number] Date of Birth: [birth year] Referring Provider:     Cardiac Rehab from 10/02/2016 in [facility]  Referring Provider  [name] MD      Encounter Date: 10/05/2016  Check In:     Session Check In - 10/05/16 0847      Check-In   Location [facility]   Staff Present Nada Maclachlan, BA, ACSM CEP, Exercise Physiologist;Derrel Moore Luan Pulling, MA, ACSM RCEP, Exercise Physiologist;Meredith Sherryll Burger, RN BSN   Supervising physician immediately available to respond to emergencies See telemetry face sheet for immediately available ER MD   Medication changes reported     No   Fall or balance concerns reported    No   Warm-up and Cool-down Performed on first and last piece of equipment   Resistance Training Performed Yes   VAD Patient? No     Pain Assessment   Currently in Pain? No/denies   Multiple Pain Sites No         History  Smoking Status  . Former Smoker  Smokeless Tobacco  . Former Systems developer  . Quit date: 05/16/2004    Goals Met:  Exercise tolerated well Personal goals reviewed No report of cardiac concerns or symptoms Strength training completed today  Goals Unmet:  Not Applicable  Comments: First full day of exercise!  Patient was oriented to gym and equipment including functions, settings, policies, and procedures.  Patient's individual exercise prescription and treatment plan were reviewed.  All starting workloads were established based on the results of the 6 minute walk test done at initial orientation visit.  The plan for exercise progression was also introduced and progression will be customized based on patient's performance and goals.    Dr. Emily Filbert is Medical Director for [place] and [facility].

## 2016-10-09 ENCOUNTER — Telehealth: Payer: Self-pay

## 2016-10-09 NOTE — Telephone Encounter (Signed)
[name] [name] left a message that he will miss Tuesday [facility] Track due to hurting his knee.

## 2016-10-10 DIAGNOSIS — B351 Tinea unguium: Secondary | ICD-10-CM | POA: Diagnosis not present

## 2016-10-10 DIAGNOSIS — L851 Acquired keratosis [keratoderma] palmaris et plantaris: Secondary | ICD-10-CM | POA: Diagnosis not present

## 2016-10-10 DIAGNOSIS — E1121 Type 2 diabetes mellitus with diabetic nephropathy: Secondary | ICD-10-CM | POA: Diagnosis not present

## 2016-10-10 DIAGNOSIS — E1142 Type 2 diabetes mellitus with diabetic polyneuropathy: Secondary | ICD-10-CM | POA: Diagnosis not present

## 2016-10-10 DIAGNOSIS — E669 Obesity, unspecified: Secondary | ICD-10-CM | POA: Diagnosis not present

## 2016-10-10 DIAGNOSIS — M7042 Prepatellar bursitis, left knee: Secondary | ICD-10-CM | POA: Diagnosis not present

## 2016-10-11 ENCOUNTER — Telehealth: Payer: Self-pay | Admitting: *Deleted

## 2016-10-11 ENCOUNTER — Encounter: Payer: Self-pay | Admitting: *Deleted

## 2016-10-11 DIAGNOSIS — Z952 Presence of prosthetic heart valve: Secondary | ICD-10-CM

## 2016-10-11 NOTE — Telephone Encounter (Signed)
[name] [name] called to let us know that he has been out due to knee pain.

## 2016-10-11 NOTE — Progress Notes (Signed)
Cardiac Individual Treatment Plan  Patient Details  Name: [name] [name] MRN: [id number] Date of Birth: Jul 26, 1945 Referring Provider:     Cardiac Rehab from 10/02/2016 in [facility] Cardiac and Pulmonary Rehab  Referring Provider  [name] MD      Initial Encounter Date:    Cardiac Rehab from 10/02/2016 in [facility] Cardiac and Pulmonary Rehab  Date  10/02/16  Referring Provider  [name] MD      Visit Diagnosis: S/P TAVR (transcatheter aortic valve replacement)  Patient's Home Medications on Admission:  Current Outpatient Prescriptions:  .  acetaminophen (TYLENOL) 325 MG tablet, Take 2 tablets (650 mg total) by mouth every 4 (four) hours as needed for mild pain (temp > 101.5)., Disp: , Rfl:  .  Ascorbic Acid (VITAMIN C) 1000 MG tablet, Take 1,000 mg by mouth daily., Disp: , Rfl:  .  aspirin EC 81 MG EC tablet, Take 1 tablet (81 mg total) by mouth daily., Disp: , Rfl:  .  budesonide (PULMICORT) 0.5 MG/2ML nebulizer solution, Take 2 mLs (0.5 mg total) by nebulization 2 (two) times daily. (Patient not taking: Reported on 09/05/2016), Disp: , Rfl: 12 .  citalopram (CELEXA) 20 MG tablet, Take 1 tablet (20 mg total) by mouth daily., Disp: , Rfl:  .  clopidogrel (PLAVIX) 75 MG tablet, Take 75 mg by mouth daily., Disp: , Rfl:  .  famotidine (PEPCID) 20 MG tablet, Take 1 tablet (20 mg total) by mouth at bedtime. (Patient not taking: Reported on 09/05/2016), Disp: , Rfl:  .  fenofibrate 160 MG tablet, Take 160 mg by mouth daily., Disp: , Rfl:  .  fluticasone (FLONASE) 50 MCG/ACT nasal spray, Place 2 sprays into both nostrils daily., Disp: , Rfl: 2 .  furosemide (LASIX) 40 MG tablet, Take 1 tablet (40 mg total) by mouth 2 (two) times daily. (Patient not taking: Reported on 09/05/2016), Disp: 30 tablet, Rfl:  .  gabapentin (NEURONTIN) 100 MG capsule, Take 2 capsules (200 mg total) by mouth 2 (two) times daily., Disp: , Rfl:  .  heparin 5000 UNIT/ML injection, Inject 1 mL (5,000 Units  total) into the skin every 8 (eight) hours. (Patient not taking: Reported on 09/05/2016), Disp: 1 mL, Rfl:  .  insulin aspart (NOVOLOG) 100 UNIT/ML injection, Inject 0-9 Units into the skin every 4 (four) hours. (Patient not taking: Reported on 09/05/2016), Disp: 10 mL, Rfl: 11 .  ipratropium-albuterol (DUONEB) 0.5-2.5 (3) MG/3ML SOLN, Take 3 mLs by nebulization every 6 (six) hours., Disp: 360 mL, Rfl:  .  methocarbamol (ROBAXIN) 750 MG tablet, Take 750 mg by mouth 4 (four) times daily., Disp: , Rfl:  .  metoprolol succinate (TOPROL-XL) 50 MG 24 hr tablet, Take 50 mg by mouth daily. Take with or immediately following a meal., Disp: , Rfl:  .  pantoprazole (PROTONIX) 40 MG tablet, Take 40 mg by mouth daily., Disp: , Rfl:  .  Potassium 95 MG TABS, Take by mouth., Disp: , Rfl:  .  sodium chloride (OCEAN) 0.65 % SOLN nasal spray, Place 1 spray into both nostrils as needed for congestion., Disp: , Rfl: 0 .  tamsulosin (FLOMAX) 0.4 MG CAPS capsule, Take 1 capsule (0.4 mg total) by mouth daily., Disp: 30 capsule, Rfl:  .  torsemide (DEMADEX) 20 MG tablet, Take 20 mg by mouth daily., Disp: , Rfl:   Past Medical History: Past Medical History:  Diagnosis Date  . CHF (congestive heart failure) ([place])   . COPD (chronic obstructive pulmonary disease) ([place])   .  Diabetes mellitus without complication ([place])   . Hypertension   . Kidney stones   . Renal disorder     Tobacco Use: History  Smoking Status  . Former Smoker  Smokeless Tobacco  . Former Systems developer  . Quit date: 05/16/2004    Labs: Recent Review Flowsheet Data    Labs for ITP Cardiac and Pulmonary Rehab Latest Ref Rng & Units 07/26/2012 07/05/2016 07/06/2016   Cholestrol 0 - 200 mg/dL 100 - -   LDLCALC 0 - 100 mg/dL 34 - -   HDL 40 - 60 mg/dL 35(L) - -   Trlycerides 0 - 200 mg/dL 157 - -   Hemoglobin A1c 4.2 - 6.3 % 6.2 - -   PHART 7.350 - 7.450 - 7.35 7.35   PCO2ART 32.0 - 48.0 mmHg - 49(H) 48   HCO3 20.0 - 28.0 mmol/L - 27.1 26.5   O2SAT % -  94.7 96.4       Exercise Target Goals:    Exercise Program Goal: Individual exercise prescription set with THRR, safety & activity barriers. Participant demonstrates ability to understand and report RPE using BORG scale, to self-measure pulse accurately, and to acknowledge the importance of the exercise prescription.  Exercise Prescription Goal: Starting with aerobic activity 30 plus minutes a day, 3 days per week for initial exercise prescription. Provide home exercise prescription and guidelines that participant acknowledges understanding prior to discharge.  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 HR 119 bpm     Max Ex. BP 146/74     2 Minute Post BP 128/64       Interval HR   Baseline HR 83     1 Minute HR 87     2 Minute HR 85     3 Minute HR 87     4 Minute HR 83     5 Minute HR 84     6 Minute HR 86     2 Minute Post HR 88     Interval Heart Rate? Yes       Interval Oxygen   Interval Oxygen? Yes     Baseline Oxygen Saturation % 94 %  95% on Room Air     Baseline Liters of Oxygen 2 L     1 Minute Oxygen Saturation % 87 %     1 Minute Liters of Oxygen 0 L  Room Air     2 Minute Oxygen Saturation % 85 %  2 Minute Liters of Oxygen 0 L     3 Minute Oxygen Saturation % 87 %     3 Minute  Liters of Oxygen 0 L     4 Minute Oxygen Saturation % 83 %     4 Minute Liters of Oxygen 0 L     5 Minute Oxygen Saturation % 84 %     5 Minute Liters of Oxygen 0 L     6 Minute Oxygen Saturation % 86 %     6 Minute Liters of Oxygen 0 L     2 Minute Post Oxygen Saturation % 95 %     2 Minute Post Liters of Oxygen 0 L        Oxygen Initial Assessment:   Oxygen Re-Evaluation:   Oxygen Discharge (Final Oxygen Re-Evaluation):   Initial Exercise Prescription:     Initial Exercise Prescription - 10/02/16 1400      Date of Initial Exercise RX and Referring Provider   Date 10/02/16   Referring Provider [name] MD     Oxygen   Oxygen Continuous   Liters 2     Treadmill   MPH 1.6   Grade 0.5   Minutes 15   METs 2.34     Recumbant Bike   Level 1   RPM 25   Watts 7   Minutes 15   METs 2.2     T5 Nustep   Level 1   SPM 80   Minutes 15   METs 2     Prescription Details   Frequency (times per week) 2   Duration Progress to 45 minutes of aerobic exercise without signs/symptoms of physical distress     Intensity   THRR 40-80% of Max Heartrate 109-135   Ratings of Perceived Exertion 11-13   Perceived Dyspnea 0-4     Progression   Progression Continue to progress workloads to maintain intensity without signs/symptoms of physical distress.     Resistance Training   Training Prescription Yes   Weight 3 lbs   Reps 10-15      Perform Capillary Blood Glucose checks as needed.  Exercise Prescription Changes:     Exercise Prescription Changes    Row Name 10/02/16 1400 10/05/16 1000           Response to Exercise   Blood Pressure (Admit) 146/74 128/56      Blood Pressure (Exercise) 146/74 138/70      Blood Pressure (Exit) 128/64 124/58      Heart Rate (Admit) 83 bpm 78 bpm      Heart Rate (Exercise) 119 bpm 109 bpm      Heart Rate (Exit) 88 bpm 67 bpm      Oxygen Saturation (Admit) 94 %  -      Oxygen Saturation (Exercise) 83 % 85 %      Oxygen  Saturation (Exit) 95 %  -      Rating of Perceived Exertion (Exercise) 9 13      Perceived Dyspnea (Exercise) 3  -      Symptoms SOB, knees felt weak none      Comments walk test results first full day of exercise      Duration  - Progress to 45 minutes of aerobic exercise without signs/symptoms of physical distress      Intensity  - THRR unchanged        Progression   Progression  - Continue to progress workloads to maintain intensity without signs/symptoms  of physical distress.      Average METs  - 2.22        Resistance Training   Training Prescription  - Yes      Weight  - 3 lbs      Reps  - 10-15        Interval Training   Interval Training  - No        Oxygen   Oxygen  - Continuous      Liters  - 2  increased to 3 on treadmill        Treadmill   MPH  - 1.6      Grade  - 0.5      Minutes  - 15      METs  - 2.34        T5 Nustep   Level  - 1      Minutes  - 15      METs  - 2.1         Exercise Comments:     Exercise Comments    Row Name 10/05/16 1037           Exercise Comments First full day of exercise!  Patient was oriented to gym and equipment including functions, settings, policies, and procedures.  Patient's individual exercise prescription and treatment plan were reviewed.  All starting workloads were established based on the results of the 6 minute walk test done at initial orientation visit.  The plan for exercise progression was also introduced and progression will be customized based on patient's performance and goals.          Exercise Goals and Review:     Exercise Goals    Row Name 10/02/16 1445             Exercise Goals   Increase Physical Activity Yes       Intervention Provide advice, education, support and counseling about physical activity/exercise needs.;Develop an individualized exercise prescription for aerobic and resistive training based on initial evaluation findings, risk stratification, comorbidities and participant's  personal goals.       Expected Outcomes Achievement of increased cardiorespiratory fitness and enhanced flexibility, muscular endurance and strength shown through measurements of functional capacity and personal statement of participant.       Increase Strength and Stamina Yes       Intervention Provide advice, education, support and counseling about physical activity/exercise needs.;Develop an individualized exercise prescription for aerobic and resistive training based on initial evaluation findings, risk stratification, comorbidities and participant's personal goals.       Expected Outcomes Achievement of increased cardiorespiratory fitness and enhanced flexibility, muscular endurance and strength shown through measurements of functional capacity and personal statement of participant.          Exercise Goals Re-Evaluation :     Exercise Goals Re-Evaluation    Row Name 10/05/16 1037             Exercise Goal Re-Evaluation   Exercise Goals Review Increase Physical Activity       Comments Eddie Dibbles has completed his first full day of exercise!  We will continue to monitor his progression       Expected Outcomes Continue to come to classes to work on increasing physical activity.          Discharge Exercise Prescription (Final Exercise Prescription Changes):     Exercise Prescription Changes - 10/05/16 1000      Response to Exercise   Blood Pressure (Admit)  128/56   Blood Pressure (Exercise) 138/70   Blood Pressure (Exit) 124/58   Heart Rate (Admit) 78 bpm   Heart Rate (Exercise) 109 bpm   Heart Rate (Exit) 67 bpm   Oxygen Saturation (Exercise) 85 %   Rating of Perceived Exertion (Exercise) 13   Symptoms none   Comments first full day of exercise   Duration Progress to 45 minutes of aerobic exercise without signs/symptoms of physical distress   Intensity THRR unchanged     Progression   Progression Continue to progress workloads to maintain intensity without signs/symptoms of  physical distress.   Average METs 2.22     Resistance Training   Training Prescription Yes   Weight 3 lbs   Reps 10-15     Interval Training   Interval Training No     Oxygen   Oxygen Continuous   Liters 2  increased to 3 on treadmill     Treadmill   MPH 1.6   Grade 0.5   Minutes 15   METs 2.34     T5 Nustep   Level 1   Minutes 15   METs 2.1      Nutrition:  Target Goals: Understanding of nutrition guidelines, daily intake of sodium 1500mg , cholesterol 200mg , calories 30% from fat and 7% or less from saturated fats, daily to have 5 or more servings of fruits and vegetables.  Biometrics:     Pre Biometrics - 10/02/16 1445      Pre Biometrics   Height 5\' 6"  (1.676 m)   Weight 209 lb 6.4 oz (95 kg)   Waist Circumference 44 inches   Hip Circumference 39 inches   Waist to Hip Ratio 1.13 %   BMI (Calculated) 33.9   Single Leg Stand 30 seconds       Nutrition Therapy Plan and Nutrition Goals:   Nutrition Discharge: Rate Your Plate Scores:     Nutrition Assessments - 10/02/16 1337      MEDFICTS Scores   Pre Score 41      Nutrition Goals Re-Evaluation:   Nutrition Goals Discharge (Final Nutrition Goals Re-Evaluation):   Psychosocial: Target Goals: Acknowledge presence or absence of significant depression and/or stress, maximize coping skills, provide positive support system. Participant is able to verbalize types and ability to use techniques and skills needed for reducing stress and depression.   Initial Review & Psychosocial Screening:     Initial Psych Review & Screening - 10/02/16 1359      Screening Interventions   Interventions Encouraged to exercise      Quality of Life Scores:      Quality of Life - 10/02/16 1336      Quality of Life Scores   Health/Function Pre 26.54 %   Socioeconomic Pre 26.64 %   Psych/Spiritual Pre 27.5 %   Family Pre 28.13 %   GLOBAL Pre 26.97 %      PHQ-9: Recent Review Flowsheet Data     Depression screen [facility] 2/9 10/02/2016   Decreased Interest 0   Down, Depressed, Hopeless 0   PHQ - 2 Score 0   Altered sleeping 0   Tired, decreased energy 0   Change in appetite 0   Feeling bad or failure about yourself  0   Trouble concentrating 0   Moving slowly or fidgety/restless 0   Suicidal thoughts 0   PHQ-9 Score 0     Interpretation of Total Score  Total Score Depression Severity:  1-4 = Minimal depression, 5-9 = Mild  depression, 10-14 = Moderate depression, 15-19 = Moderately severe depression, 20-27 = Severe depression   Psychosocial Evaluation and Intervention:   Psychosocial Re-Evaluation:   Psychosocial Discharge (Final Psychosocial Re-Evaluation):   Vocational Rehabilitation: Provide vocational rehab assistance to qualifying candidates.   Vocational Rehab Evaluation & Intervention:     Vocational Rehab - 10/02/16 1351      Initial Vocational Rehab Evaluation & Intervention   Assessment shows need for Vocational Rehabilitation No      Education: Education Goals: Education classes will be provided on a weekly basis, covering required topics. Participant will state understanding/return demonstration of topics presented.  Learning Barriers/Preferences:     Learning Barriers/Preferences - 10/02/16 1349      Learning Barriers/Preferences   Learning Barriers Hearing   Learning Preferences Individual Instruction;Verbal Instruction      Education Topics: General Nutrition Guidelines/Fats and Fiber: -Group instruction provided by verbal, written material, models and posters to present the general guidelines for heart healthy nutrition. Gives an explanation and review of dietary fats and fiber.   Controlling Sodium/Reading Food Labels: -Group verbal and written material supporting the discussion of sodium use in heart healthy nutrition. Review and explanation with models, verbal and written materials for utilization of the food label.   Exercise  Physiology & Risk Factors: - Group verbal and written instruction with models to review the exercise physiology of the cardiovascular system and associated critical values. Details cardiovascular disease risk factors and the goals associated with each risk factor.   Aerobic Exercise & Resistance Training: - Gives group verbal and written discussion on the health impact of inactivity. On the components of aerobic and resistive training programs and the benefits of this training and how to safely progress through these programs.   Flexibility, Balance, General Exercise Guidelines: - Provides group verbal and written instruction on the benefits of flexibility and balance training programs. Provides general exercise guidelines with specific guidelines to those with heart or lung disease. Demonstration and skill practice provided.   Stress Management: - Provides group verbal and written instruction about the health risks of elevated stress, cause of high stress, and healthy ways to reduce stress.   Depression: - Provides group verbal and written instruction on the correlation between heart/lung disease and depressed mood, treatment options, and the stigmas associated with seeking treatment.   Anatomy & Physiology of the Heart: - Group verbal and written instruction and models provide basic cardiac anatomy and physiology, with the coronary electrical and arterial systems. Review of: AMI, Angina, Valve disease, Heart Failure, Cardiac Arrhythmia, Pacemakers, and the ICD.   Cardiac Procedures: - Group verbal and written instruction and models to describe the testing methods done to diagnose heart disease. Reviews the outcomes of the test results. Describes the treatment choices: Medical Management, Angioplasty, or Coronary Bypass Surgery.   Cardiac Medications: - Group verbal and written instruction to review commonly prescribed medications for heart disease. Reviews the medication, class of the  drug, and side effects. Includes the steps to properly store meds and maintain the prescription regimen.   Cardiac Rehab from 10/05/2016 in [facility] Cardiac and Pulmonary Rehab  Date  10/05/16 [6/14 Part Two]  Educator  SB  Instruction Review Code  2- meets goals/outcomes      Go Sex-Intimacy & Heart Disease, Get SMART - Goal Setting: - Group verbal and written instruction through game format to discuss heart disease and the return to sexual intimacy. Provides group verbal and written material to discuss and apply goal setting through the  application of the S.M.A.R.T. Method.   Other Matters of the Heart: - Provides group verbal, written materials and models to describe Heart Failure, Angina, Valve Disease, and Diabetes in the realm of heart disease. Includes description of the disease process and treatment options available to the cardiac patient.   Exercise & Equipment Safety: - Individual verbal instruction and demonstration of equipment use and safety with use of the equipment.   Cardiac Rehab from 10/05/2016 in [facility] Cardiac and Pulmonary Rehab  Date  10/02/16  Educator  C. EnterkinRN  Instruction Review Code  1- partially meets, needs review/practice      Infection Prevention: - Provides verbal and written material to individual with discussion of infection control including proper hand washing and proper equipment cleaning during exercise session.   Cardiac Rehab from 10/05/2016 in [facility] Cardiac and Pulmonary Rehab  Date  10/02/16  Educator  C. Enterkin, RN  Instruction Review Code  2- meets goals/outcomes      Falls Prevention: - Provides verbal and written material to individual with discussion of falls prevention and safety.   Cardiac Rehab from 10/05/2016 in [facility] Cardiac and Pulmonary Rehab  Date  10/02/16  Educator  C. Enterkin,RN  Instruction Review Code  2- meets goals/outcomes      Diabetes: - Individual verbal and written instruction to review signs/symptoms of  diabetes, desired ranges of glucose level fasting, after meals and with exercise. Advice that pre and post exercise glucose checks will be done for 3 sessions at entry of program.   Cardiac Rehab from 10/05/2016 in [facility] Cardiac and Pulmonary Rehab  Date  10/02/16  Educator  C. Enterkin,RN  Instruction Review Code  1- partially meets, needs review/practice       Knowledge Questionnaire Score:     Knowledge Questionnaire Score - 10/02/16 1337      Knowledge Questionnaire Score   Pre Score 21      Core Components/Risk Factors/Patient Goals at Admission:     Personal Goals and Risk Factors at Admission - 10/02/16 1358      Core Components/Risk Factors/Patient Goals on Admission    Weight Management Yes;Obesity;Weight Loss   Intervention Weight Management: Develop a combined nutrition and exercise program designed to reach desired caloric intake, while maintaining appropriate intake of nutrient and fiber, sodium and fats, and appropriate energy expenditure required for the weight goal.;Weight Management: Provide education and appropriate resources to help participant work on and attain dietary goals.;Weight Management/Obesity: Establish reasonable short term and long term weight goals.;Obesity: Provide education and appropriate resources to help participant work on and attain dietary goals.   Admit Weight 176 lb 12.8 oz (80.2 kg)   Goal Weight: Short Term 171 lb (77.6 kg)   Goal Weight: Long Term 166 lb (75.3 kg)   Expected Outcomes Short Term: Continue to assess and modify interventions until short term weight is achieved;Long Term: Adherence to nutrition and physical activity/exercise program aimed toward attainment of established weight goal;Weight Loss: Understanding of general recommendations for a balanced deficit meal plan, which promotes 1-2 lb weight loss per week and includes a negative energy balance of [phone] kcal/d;Understanding recommendations for meals to include 15-35%  energy as protein, 25-35% energy from fat, 35-60% energy from carbohydrates, less than 200mg  of dietary cholesterol, 20-35 gm of total fiber daily;Understanding of distribution of calorie intake throughout the day with the consumption of 4-5 meals/snacks   Diabetes Yes   Intervention Provide education about signs/symptoms and action to take for hypo/hyperglycemia.;Provide education about proper nutrition,  including hydration, and aerobic/resistive exercise prescription along with prescribed medications to achieve blood glucose in normal ranges: Fasting glucose 65-99 mg/dL   Expected Outcomes Short Term: Participant verbalizes understanding of the signs/symptoms and immediate care of hyper/hypoglycemia, proper foot care and importance of medication, aerobic/resistive exercise and nutrition plan for blood glucose control.;Long Term: Attainment of HbA1C < 7%.   Hypertension Yes   Intervention Provide education on lifestyle modifcations including regular physical activity/exercise, weight management, moderate sodium restriction and increased consumption of fresh fruit, vegetables, and low fat dairy, alcohol moderation, and smoking cessation.;Monitor prescription use compliance.   Expected Outcomes Short Term: Continued assessment and intervention until BP is < 140/4mm HG in hypertensive participants. < 130/47mm HG in hypertensive participants with diabetes, heart failure or chronic kidney disease.;Long Term: Maintenance of blood pressure at goal levels.   Lipids Yes   Intervention Provide education and support for participant on nutrition & aerobic/resistive exercise along with prescribed medications to achieve LDL 70mg , HDL >40mg .   Expected Outcomes Short Term: Participant states understanding of desired cholesterol values and is compliant with medications prescribed. Participant is following exercise prescription and nutrition guidelines.;Long Term: Cholesterol controlled with medications as prescribed,  with individualized exercise RX and with personalized nutrition plan. Value goals: LDL < 70mg , HDL > 40 mg.      Core Components/Risk Factors/Patient Goals Review:    Core Components/Risk Factors/Patient Goals at Discharge (Final Review):    ITP Comments:     ITP Comments    Row Name 10/02/16 1357 10/11/16 0551         ITP Comments ITP Created after Cardiac Rehab informed consent signed during the Medical Review.  30 day review. Continue with ITP unless directed changes per Medical Director review.  New start to program         Comments:

## 2016-10-16 DIAGNOSIS — E782 Mixed hyperlipidemia: Secondary | ICD-10-CM | POA: Diagnosis not present

## 2016-10-16 DIAGNOSIS — G4739 Other sleep apnea: Secondary | ICD-10-CM | POA: Diagnosis not present

## 2016-10-16 DIAGNOSIS — I2510 Atherosclerotic heart disease of native coronary artery without angina pectoris: Secondary | ICD-10-CM | POA: Diagnosis not present

## 2016-10-16 DIAGNOSIS — I10 Essential (primary) hypertension: Secondary | ICD-10-CM | POA: Diagnosis not present

## 2016-10-17 ENCOUNTER — Encounter: Payer: Self-pay | Admitting: *Deleted

## 2016-10-17 DIAGNOSIS — Z952 Presence of prosthetic heart valve: Secondary | ICD-10-CM

## 2016-10-19 ENCOUNTER — Encounter: Payer: PPO | Admitting: *Deleted

## 2016-10-19 DIAGNOSIS — Z952 Presence of prosthetic heart valve: Secondary | ICD-10-CM

## 2016-10-19 DIAGNOSIS — Z954 Presence of other heart-valve replacement: Secondary | ICD-10-CM | POA: Diagnosis not present

## 2016-10-19 LAB — GLUCOSE, CAPILLARY
GLUCOSE-CAPILLARY: 219 mg/dL — AB (ref 65–99)
GLUCOSE-CAPILLARY: 109 mg/dL — AB (ref 65–99)

## 2016-10-19 NOTE — Progress Notes (Signed)
Daily Session Note  Patient Details  Name: [name] [name] MRN: [id number] Date of Birth: [birth year] Referring Provider:     Cardiac Rehab from 10/02/2016 in [facility] Cardiac and Pulmonary Rehab  Referring Provider  [name] MD      Encounter Date: 10/19/2016  Check In:     Session Check In - 10/19/16 0916      Check-In   Location [facility]-Cardiac & Pulmonary Rehab   Staff Present Nyoka Cowden, RN, BSN, MA;Kamaljit Hizer Sherryll Burger, RN BSN;Jessica Luan Pulling, MA, ACSM RCEP, Exercise Physiologist   Supervising physician immediately available to respond to emergencies See telemetry face sheet for immediately available ER MD   Medication changes reported     No   Fall or balance concerns reported    No   Warm-up and Cool-down Performed on first and last piece of equipment   Resistance Training Performed Yes   VAD Patient? No     Pain Assessment   Currently in Pain? No/denies         History  Smoking Status  . Former Smoker  Smokeless Tobacco  . Former Systems developer  . Quit date: 05/16/2004    Goals Met:  Proper associated with RPD/PD & O2 Sat Independence with exercise equipment Exercise tolerated well No report of cardiac concerns or symptoms Strength training completed today  Goals Unmet:  Not Applicable  Comments: Pt able to follow exercise prescription today without complaint.  Will continue to monitor for progression.    Dr. Emily Filbert is Medical Director for [place] and [facility].

## 2016-10-22 DIAGNOSIS — J449 Chronic obstructive pulmonary disease, unspecified: Secondary | ICD-10-CM | POA: Diagnosis not present

## 2016-10-24 ENCOUNTER — Encounter: Payer: PPO | Attending: Cardiovascular Disease | Admitting: *Deleted

## 2016-10-24 DIAGNOSIS — Z954 Presence of other heart-valve replacement: Secondary | ICD-10-CM | POA: Diagnosis not present

## 2016-10-24 DIAGNOSIS — Z952 Presence of prosthetic heart valve: Secondary | ICD-10-CM

## 2016-10-24 LAB — GLUCOSE, CAPILLARY
GLUCOSE-CAPILLARY: 176 mg/dL — AB (ref 65–99)
Glucose-Capillary: 110 mg/dL — ABNORMAL HIGH (ref 65–99)

## 2016-10-24 NOTE — Progress Notes (Signed)
Daily Session Note  Patient Details  Name: [name] [name] MRN: [id number] Date of Birth: [birth year] Referring Provider:     Cardiac Rehab from 10/02/2016 in [facility] Cardiac and Pulmonary Rehab  Referring Provider  [name] MD      Encounter Date: 10/24/2016  Check In:     Session Check In - 10/24/16 0827      Check-In   Location [facility]-Cardiac & Pulmonary Rehab   Staff Present Heath Lark, RN, BSN, CCRP;Jessica Luan Pulling, MA, ACSM RCEP, Exercise Physiologist;Raphaella Larkin Oletta Darter, BA, ACSM CEP, Exercise Physiologist   Supervising physician immediately available to respond to emergencies See telemetry face sheet for immediately available ER MD   Medication changes reported     No   Fall or balance concerns reported    No   Warm-up and Cool-down Performed on first and last piece of equipment   Resistance Training Performed Yes   VAD Patient? No     Pain Assessment   Currently in Pain? No/denies   Multiple Pain Sites No           Exercise Prescription Changes - 10/24/16 1000      Home Exercise Plan   Plans to continue exercise at Home (comment)  treadmill and bike   Frequency Add 2 additional days to program exercise sessions.   Initial Home Exercises Provided 10/24/16      History  Smoking Status  . Former Smoker  Smokeless Tobacco  . Former Systems developer  . Quit date: 05/16/2004    Goals Met:  Independence with exercise equipment Exercise tolerated well No report of cardiac concerns or symptoms Strength training completed today  Goals Unmet:  Not Applicable  Comments: Reviewed home exercise with pt today.  Pt plans to walk and bike for exercise.  Reviewed THR, pulse, RPE, sign and symptoms, NTG use, and when to call 911 or MD.  Also discussed weather considerations and indoor options.  Pt voiced understanding.    Dr. Emily Filbert is Medical Director for [place] and [facility].

## 2016-10-26 DIAGNOSIS — Z954 Presence of other heart-valve replacement: Secondary | ICD-10-CM | POA: Diagnosis not present

## 2016-10-26 DIAGNOSIS — Z952 Presence of prosthetic heart valve: Secondary | ICD-10-CM

## 2016-10-26 NOTE — Progress Notes (Signed)
Daily Session Note  Patient Details  Name: [name] [name] MRN: [id number] Date of Birth: 10-Dec-1945 Referring Provider:     Cardiac Rehab from 10/02/2016 in [facility] Cardiac and Pulmonary Rehab  Referring Provider  [name] MD      Encounter Date: 10/26/2016  Check In:     Session Check In - 10/26/16 0912      Check-In   Location [facility]-Cardiac & Pulmonary Rehab   Staff Present Alberteen Sam, MA, ACSM RCEP, Exercise Physiologist;Lachrisha Ziebarth Oletta Darter, BA, ACSM CEP, Exercise Physiologist;Meredith Sherryll Burger, RN BSN   Supervising physician immediately available to respond to emergencies See telemetry face sheet for immediately available ER MD   Medication changes reported     No   Fall or balance concerns reported    No   Warm-up and Cool-down Performed on first and last piece of equipment   Resistance Training Performed Yes   VAD Patient? No     Pain Assessment   Currently in Pain? No/denies         History  Smoking Status  . Former Smoker  Smokeless Tobacco  . Former Systems developer  . Quit date: 05/16/2004    Goals Met:  Independence with exercise equipment Exercise tolerated well No report of cardiac concerns or symptoms Strength training completed today  Goals Unmet:  Not Applicable  Comments: Pt able to follow exercise prescription today without complaint.  Will continue to monitor for progression.    Dr. Emily Filbert is Medical Director for [place] and [facility].

## 2016-10-29 DIAGNOSIS — J449 Chronic obstructive pulmonary disease, unspecified: Secondary | ICD-10-CM | POA: Diagnosis not present

## 2016-10-31 DIAGNOSIS — Z954 Presence of other heart-valve replacement: Secondary | ICD-10-CM | POA: Diagnosis not present

## 2016-10-31 DIAGNOSIS — Z952 Presence of prosthetic heart valve: Secondary | ICD-10-CM

## 2016-10-31 NOTE — Progress Notes (Signed)
Daily Session Note  Patient Details  Name: [name] [name] MRN: [id number] Date of Birth: 30-Dec-1945 Referring Provider:     Cardiac Rehab from 10/02/2016 in [facility] Cardiac and Pulmonary Rehab  Referring Provider  [name] MD      Encounter Date: 10/31/2016  Check In:     Session Check In - 10/31/16 0929      Check-In   Location [facility]-Cardiac & Pulmonary Rehab   Staff Present Heath Lark, RN, BSN, Lance Sell, BA, ACSM CEP, Exercise Physiologist;Krista Frederico Hamman, RN BSN   Supervising physician immediately available to respond to emergencies See telemetry face sheet for immediately available ER MD   Medication changes reported     No   Fall or balance concerns reported    No   Warm-up and Cool-down Performed on first and last piece of equipment   Resistance Training Performed Yes   VAD Patient? No     Pain Assessment   Currently in Pain? No/denies         History  Smoking Status  . Former Smoker  Smokeless Tobacco  . Former Systems developer  . Quit date: 05/16/2004    Goals Met:  Independence with exercise equipment Exercise tolerated well No report of cardiac concerns or symptoms Strength training completed today  Goals Unmet:  Not Applicable  Comments: Pt able to follow exercise prescription today without complaint.  Will continue to monitor for progression.    Dr. Emily Filbert is Medical Director for [place] and [facility].

## 2016-11-02 ENCOUNTER — Encounter: Payer: PPO | Admitting: *Deleted

## 2016-11-02 DIAGNOSIS — Z952 Presence of prosthetic heart valve: Secondary | ICD-10-CM

## 2016-11-02 DIAGNOSIS — Z954 Presence of other heart-valve replacement: Secondary | ICD-10-CM | POA: Diagnosis not present

## 2016-11-02 NOTE — Progress Notes (Signed)
Daily Session Note  Patient Details  Name: [name] [name] MRN: [id number] Date of Birth: 17-Apr-1946 Referring Provider:     Cardiac Rehab from 10/02/2016 in [facility] Cardiac and Pulmonary Rehab  Referring Provider  [name] MD      Encounter Date: 11/02/2016  Check In:     Session Check In - 11/02/16 0827      Check-In   Location [facility]-Cardiac & Pulmonary Rehab   Staff Present Nyoka Cowden, RN, BSN, MA;Meredith Sherryll Burger, RN Vickki Hearing, BA, ACSM CEP, Exercise Physiologist   Supervising physician immediately available to respond to emergencies See telemetry face sheet for immediately available ER MD   Medication changes reported     No   Fall or balance concerns reported    No   Warm-up and Cool-down Performed on first and last piece of equipment   Resistance Training Performed Yes   VAD Patient? No     Pain Assessment   Currently in Pain? No/denies           Exercise Prescription Changes - 11/01/16 1400      Response to Exercise   Blood Pressure (Admit) 132/76   Blood Pressure (Exercise) 160/80   Blood Pressure (Exit) 140/70   Heart Rate (Admit) 87 bpm   Heart Rate (Exercise) 111 bpm   Heart Rate (Exit) 78 bpm   Rating of Perceived Exertion (Exercise) 15   Symptoms none   Duration Progress to 45 minutes of aerobic exercise without signs/symptoms of physical distress   Intensity THRR unchanged     Progression   Progression Continue to progress workloads to maintain intensity without signs/symptoms of physical distress.   Average METs 2.6     Resistance Training   Training Prescription Yes   Weight 4 lb   Reps 10-15     Interval Training   Interval Training No     Oxygen   Oxygen Continuous   Liters 2     Recumbant Bike   Level 1   Watts 22   Minutes 15   METs 2.6      History  Smoking Status  . Former Smoker  Smokeless Tobacco  . Former Systems developer  . Quit date: 05/16/2004    Goals Met:  Proper associated with RPD/PD & O2  Sat Independence with exercise equipment Exercise tolerated well No report of cardiac concerns or symptoms Strength training completed today  Goals Unmet:  Not Applicable  Comments: Pt able to follow exercise prescription today without complaint.  Will continue to monitor for progression.    Dr. Emily Filbert is Medical Director for [place] and [facility].

## 2016-11-06 DIAGNOSIS — E119 Type 2 diabetes mellitus without complications: Secondary | ICD-10-CM | POA: Diagnosis not present

## 2016-11-06 DIAGNOSIS — I10 Essential (primary) hypertension: Secondary | ICD-10-CM | POA: Diagnosis not present

## 2016-11-06 DIAGNOSIS — G4739 Other sleep apnea: Secondary | ICD-10-CM | POA: Diagnosis not present

## 2016-11-06 DIAGNOSIS — E782 Mixed hyperlipidemia: Secondary | ICD-10-CM | POA: Diagnosis not present

## 2016-11-07 ENCOUNTER — Encounter: Payer: PPO | Admitting: *Deleted

## 2016-11-07 DIAGNOSIS — Z952 Presence of prosthetic heart valve: Secondary | ICD-10-CM

## 2016-11-07 DIAGNOSIS — Z954 Presence of other heart-valve replacement: Secondary | ICD-10-CM | POA: Diagnosis not present

## 2016-11-07 NOTE — Progress Notes (Signed)
Daily Session Note  Patient Details  Name: Prynce Jacober MRN: [id number] Date of Birth: 05-09-1945 Referring Provider:     Cardiac Rehab from 10/02/2016 in [facility] Cardiac and Pulmonary Rehab  Referring Provider  [name] MD      Encounter Date: 11/07/2016  Check In:     Session Check In - 11/07/16 0827      Check-In   Location [facility]-Cardiac & Pulmonary Rehab   Staff Present Heath Lark, RN, BSN, CCRP;Jessica Luan Pulling, MA, ACSM RCEP, Exercise Physiologist;Amanda Oletta Darter, BA, ACSM CEP, Exercise Physiologist   Supervising physician immediately available to respond to emergencies See telemetry face sheet for immediately available ER MD   Medication changes reported     No   Fall or balance concerns reported    No   Warm-up and Cool-down Performed on first and last piece of equipment   Resistance Training Performed Yes   VAD Patient? No     Pain Assessment   Currently in Pain? No/denies   Multiple Pain Sites No         History  Smoking Status  . Former Smoker  Smokeless Tobacco  . Former Systems developer  . Quit date: 05/16/2004    Goals Met:  Independence with exercise equipment Exercise tolerated well No report of cardiac concerns or symptoms Strength training completed today  Goals Unmet:  Not Applicable  Comments: Pt able to follow exercise prescription today without complaint.  Will continue to monitor for progression.    Dr. Emily Filbert is Medical Director for [place] and [facility].

## 2016-11-08 ENCOUNTER — Encounter: Payer: Self-pay | Admitting: *Deleted

## 2016-11-08 DIAGNOSIS — Z952 Presence of prosthetic heart valve: Secondary | ICD-10-CM

## 2016-11-08 NOTE — Progress Notes (Signed)
Cardiac Individual Treatment Plan  Patient Details  Name: [name] [name] MRN: [id number] Date of Birth: Jul 26, 1945 Referring Provider:     Cardiac Rehab from 10/02/2016 in [facility] Cardiac and Pulmonary Rehab  Referring Provider  [name] MD      Initial Encounter Date:    Cardiac Rehab from 10/02/2016 in [facility] Cardiac and Pulmonary Rehab  Date  10/02/16  Referring Provider  [name] MD      Visit Diagnosis: S/P TAVR (transcatheter aortic valve replacement)  Patient's Home Medications on Admission:  Current Outpatient Prescriptions:  .  acetaminophen (TYLENOL) 325 MG tablet, Take 2 tablets (650 mg total) by mouth every 4 (four) hours as needed for mild pain (temp > 101.5)., Disp: , Rfl:  .  Ascorbic Acid (VITAMIN C) 1000 MG tablet, Take 1,000 mg by mouth daily., Disp: , Rfl:  .  aspirin EC 81 MG EC tablet, Take 1 tablet (81 mg total) by mouth daily., Disp: , Rfl:  .  budesonide (PULMICORT) 0.5 MG/2ML nebulizer solution, Take 2 mLs (0.5 mg total) by nebulization 2 (two) times daily. (Patient not taking: Reported on 09/05/2016), Disp: , Rfl: 12 .  citalopram (CELEXA) 20 MG tablet, Take 1 tablet (20 mg total) by mouth daily., Disp: , Rfl:  .  clopidogrel (PLAVIX) 75 MG tablet, Take 75 mg by mouth daily., Disp: , Rfl:  .  famotidine (PEPCID) 20 MG tablet, Take 1 tablet (20 mg total) by mouth at bedtime. (Patient not taking: Reported on 09/05/2016), Disp: , Rfl:  .  fenofibrate 160 MG tablet, Take 160 mg by mouth daily., Disp: , Rfl:  .  fluticasone (FLONASE) 50 MCG/ACT nasal spray, Place 2 sprays into both nostrils daily., Disp: , Rfl: 2 .  furosemide (LASIX) 40 MG tablet, Take 1 tablet (40 mg total) by mouth 2 (two) times daily. (Patient not taking: Reported on 09/05/2016), Disp: 30 tablet, Rfl:  .  gabapentin (NEURONTIN) 100 MG capsule, Take 2 capsules (200 mg total) by mouth 2 (two) times daily., Disp: , Rfl:  .  heparin 5000 UNIT/ML injection, Inject 1 mL (5,000 Units  total) into the skin every 8 (eight) hours. (Patient not taking: Reported on 09/05/2016), Disp: 1 mL, Rfl:  .  insulin aspart (NOVOLOG) 100 UNIT/ML injection, Inject 0-9 Units into the skin every 4 (four) hours. (Patient not taking: Reported on 09/05/2016), Disp: 10 mL, Rfl: 11 .  ipratropium-albuterol (DUONEB) 0.5-2.5 (3) MG/3ML SOLN, Take 3 mLs by nebulization every 6 (six) hours., Disp: 360 mL, Rfl:  .  methocarbamol (ROBAXIN) 750 MG tablet, Take 750 mg by mouth 4 (four) times daily., Disp: , Rfl:  .  metoprolol succinate (TOPROL-XL) 50 MG 24 hr tablet, Take 50 mg by mouth daily. Take with or immediately following a meal., Disp: , Rfl:  .  pantoprazole (PROTONIX) 40 MG tablet, Take 40 mg by mouth daily., Disp: , Rfl:  .  Potassium 95 MG TABS, Take by mouth., Disp: , Rfl:  .  sodium chloride (OCEAN) 0.65 % SOLN nasal spray, Place 1 spray into both nostrils as needed for congestion., Disp: , Rfl: 0 .  tamsulosin (FLOMAX) 0.4 MG CAPS capsule, Take 1 capsule (0.4 mg total) by mouth daily., Disp: 30 capsule, Rfl:  .  torsemide (DEMADEX) 20 MG tablet, Take 20 mg by mouth daily., Disp: , Rfl:   Past Medical History: Past Medical History:  Diagnosis Date  . CHF (congestive heart failure) ([place])   . COPD (chronic obstructive pulmonary disease) ([place])   .  Diabetes mellitus without complication ([place])   . Hypertension   . Kidney stones   . Renal disorder     Tobacco Use: History  Smoking Status  . Former Smoker  Smokeless Tobacco  . Former Systems developer  . Quit date: 05/16/2004    Labs: Recent Review Flowsheet Data    Labs for ITP Cardiac and Pulmonary Rehab Latest Ref Rng & Units 07/26/2012 07/05/2016 07/06/2016   Cholestrol 0 - 200 mg/dL 100 - -   LDLCALC 0 - 100 mg/dL 34 - -   HDL 40 - 60 mg/dL 35(L) - -   Trlycerides 0 - 200 mg/dL 157 - -   Hemoglobin A1c 4.2 - 6.3 % 6.2 - -   PHART 7.350 - 7.450 - 7.35 7.35   PCO2ART 32.0 - 48.0 mmHg - 49(H) 48   HCO3 20.0 - 28.0 mmol/L - 27.1 26.5   O2SAT % -  94.7 96.4       Exercise Target Goals:    Exercise Program Goal: Individual exercise prescription set with THRR, safety & activity barriers. Participant demonstrates ability to understand and report RPE using BORG scale, to self-measure pulse accurately, and to acknowledge the importance of the exercise prescription.  Exercise Prescription Goal: Starting with aerobic activity 30 plus minutes a day, 3 days per week for initial exercise prescription. Provide home exercise prescription and guidelines that participant acknowledges understanding prior to discharge.  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 HR 119 bpm     Max Ex. BP 146/74     2 Minute Post BP 128/64       Interval HR   Baseline HR 83     1 Minute HR 87     2 Minute HR 85     3 Minute HR 87     4 Minute HR 83     5 Minute HR 84     6 Minute HR 86     2 Minute Post HR 88     Interval Heart Rate? Yes       Interval Oxygen   Interval Oxygen? Yes     Baseline Oxygen Saturation % 94 %  95% on Room Air     Baseline Liters of Oxygen 2 L     1 Minute Oxygen Saturation % 87 %     1 Minute Liters of Oxygen 0 L  Room Air     2 Minute Oxygen Saturation % 85 %  2 Minute Liters of Oxygen 0 L     3 Minute Oxygen Saturation % 87 %     3 Minute  Liters of Oxygen 0 L     4 Minute Oxygen Saturation % 83 %     4 Minute Liters of Oxygen 0 L     5 Minute Oxygen Saturation % 84 %     5 Minute Liters of Oxygen 0 L     6 Minute Oxygen Saturation % 86 %     6 Minute Liters of Oxygen 0 L     2 Minute Post Oxygen Saturation % 95 %     2 Minute Post Liters of Oxygen 0 L        Oxygen Initial Assessment:   Oxygen Re-Evaluation:   Oxygen Discharge (Final Oxygen Re-Evaluation):   Initial Exercise Prescription:     Initial Exercise Prescription - 10/02/16 1400      Date of Initial Exercise RX and Referring Provider   Date 10/02/16   Referring Provider [name] MD     Oxygen   Oxygen Continuous   Liters 2     Treadmill   MPH 1.6   Grade 0.5   Minutes 15   METs 2.34     Recumbant Bike   Level 1   RPM 25   Watts 7   Minutes 15   METs 2.2     T5 Nustep   Level 1   SPM 80   Minutes 15   METs 2     Prescription Details   Frequency (times per week) 2   Duration Progress to 45 minutes of aerobic exercise without signs/symptoms of physical distress     Intensity   THRR 40-80% of Max Heartrate 109-135   Ratings of Perceived Exertion 11-13   Perceived Dyspnea 0-4     Progression   Progression Continue to progress workloads to maintain intensity without signs/symptoms of physical distress.     Resistance Training   Training Prescription Yes   Weight 3 lbs   Reps 10-15      Perform Capillary Blood Glucose checks as needed.  Exercise Prescription Changes:     Exercise Prescription Changes    Row Name 10/02/16 1400 10/05/16 1000 10/24/16 1000 11/01/16 1400       Response to Exercise   Blood Pressure (Admit) 146/74 128/56  - 132/76    Blood Pressure (Exercise) 146/74 138/70  - 160/80    Blood Pressure (Exit) 128/64 124/58  - 140/70    Heart Rate (Admit) 83 bpm 78 bpm  - 87 bpm    Heart Rate (Exercise) 119 bpm 109 bpm  - 111 bpm    Heart Rate (Exit) 88 bpm 67 bpm  - 78 bpm    Oxygen Saturation  (Admit) 94 %  -  -  -    Oxygen Saturation (Exercise) 83 % 85 %  -  -    Oxygen Saturation (Exit) 95 %  -  -  -    Rating of Perceived Exertion (Exercise) 9 13  - 15    Perceived Dyspnea (Exercise) 3  -  -  -    Symptoms SOB, knees felt weak none  - none    Comments walk test results first full day of exercise  -  -    Duration  - Progress to 45 minutes of aerobic exercise without signs/symptoms of physical distress  - Progress to 45 minutes of aerobic exercise without signs/symptoms  of physical distress    Intensity  - THRR unchanged  - THRR unchanged      Progression   Progression  - Continue to progress workloads to maintain intensity without signs/symptoms of physical distress.  - Continue to progress workloads to maintain intensity without signs/symptoms of physical distress.    Average METs  - 2.22  - 2.6      Resistance Training   Training Prescription  - Yes  - Yes    Weight  - 3 lbs  - 4 lb    Reps  - 10-15  - 10-15      Interval Training   Interval Training  - No  - No      Oxygen   Oxygen  - Continuous  - Continuous    Liters  - 2  increased to 3 on treadmill  - 2      Treadmill   MPH  - 1.6  -  -    Grade  - 0.5  -  -    Minutes  - 15  -  -    METs  - 2.34  -  -      Recumbant Bike   Level  -  -  - 1    Watts  -  -  - 22    Minutes  -  -  - 15    METs  -  -  - 2.6      T5 Nustep   Level  - 1  -  -    Minutes  - 15  -  -    METs  - 2.1  -  -      Home Exercise Plan   Plans to continue exercise at  -  - Home (comment)  treadmill and bike  -    Frequency  -  - Add 2 additional days to program exercise sessions.  -    Initial Home Exercises Provided  -  - 10/24/16  -       Exercise Comments:     Exercise Comments    Row Name 10/05/16 1037           Exercise Comments First full day of exercise!  Patient was oriented to gym and equipment including functions, settings, policies, and procedures.  Patient's individual exercise prescription and treatment  plan were reviewed.  All starting workloads were established based on the results of the 6 minute walk test done at initial orientation visit.  The plan for exercise progression was also introduced and progression will be customized based on patient's performance and goals.          Exercise Goals and Review:     Exercise Goals    Row Name 10/02/16 1445             Exercise Goals   Increase Physical Activity Yes       Intervention Provide advice, education, support and counseling about physical activity/exercise needs.;Develop an individualized exercise prescription for aerobic and resistive training based on initial evaluation findings, risk stratification, comorbidities and participant's personal goals.       Expected Outcomes Achievement of increased cardiorespiratory fitness and enhanced flexibility, muscular endurance and strength shown through measurements of functional capacity and personal statement of participant.       Increase Strength and Stamina Yes       Intervention Provide advice, education, support and counseling about physical activity/exercise needs.;Develop an individualized exercise prescription for aerobic  and resistive training based on initial evaluation findings, risk stratification, comorbidities and participant's personal goals.       Expected Outcomes Achievement of increased cardiorespiratory fitness and enhanced flexibility, muscular endurance and strength shown through measurements of functional capacity and personal statement of participant.          Exercise Goals Re-Evaluation :     Exercise Goals Re-Evaluation    Row Name 10/05/16 1037 10/17/16 1328 10/24/16 1011 11/01/16 1455       Exercise Goal Re-Evaluation   Exercise Goals Review Increase Physical Activity  - Increase Physical Activity;Increase Strenth and Stamina Increase Physical Activity;Increase Strenth and Stamina    Comments [name] [name] has completed his first full day of exercise!  We will  continue to monitor his progression Out since last review due to knee pain.  He was cleared to return by orthopedics today. Reviewed home exercise with pt today.  Pt plans to walk and bike for exercise.  Reviewed THR, pulse, RPE, sign and symptoms, NTG use, and when to call 911 or MD.  Also discussed weather considerations and indoor options.  Pt voiced understanding. [name] [name] has increased his [place]cal [place] on the bike and weight for strength work.    Expected Outcomes Continue to come to classes to work on increasing physical activity.  - Short - start home exercise Long - continue to exercise independently [place] will continue to increase his worklaod and improve overall functional capacity.  [place] wil graduate and continue to exercise independently.       Discharge Exercise Prescription (Final Exercise Prescription Changes):     Exercise Prescription Changes - 11/01/16 1400      Response to Exercise   Blood Pressure (Admit) 132/76   Blood Pressure (Exercise) 160/80   Blood Pressure (Exit) 140/70   Heart Rate (Admit) 87 bpm   Heart Rate (Exercise) 111 bpm   Heart Rate (Exit) 78 bpm   Rating of Perceived Exertion (Exercise) 15   Symptoms none   Duration Progress to 45 minutes of aerobic exercise without signs/symptoms of physical distress   Intensity THRR unchanged     Progression   Progression Continue to progress workloads to maintain intensity without signs/symptoms of physical distress.   Average METs 2.6     Resistance Training   Training Prescription Yes   Weight 4 lb   Reps 10-15     Interval Training   Interval Training No     Oxygen   Oxygen Continuous   Liters 2     Recumbant Bike   Level 1   Watts 22   Minutes 15   METs 2.6      Nutrition:  Target Goals: Understanding of nutrition guidelines, daily intake of sodium <153m, cholesterol <2029m calories 30% from fat and 7% or less from saturated fats, daily to have 5 or more servings of fruits and  vegetables.  Biometrics:     Pre Biometrics - 10/02/16 1445      Pre Biometrics   Height _0  (1.676 m)   Weight 209 lb 6.4 oz (95 kg)   Waist Circumference 44 inches   Hip Circumference 39 inches   Waist to Hip Ratio 1.13 %   BMI (Calculated) 33.9   Single Leg Stand 30 seconds       Nutrition Therapy Plan and Nutrition Goals:     Nutrition Therapy & Goals - 10/26/16 0917      Nutrition Therapy   RD appointment defered Yes  Nutrition Discharge: Rate Your Plate Scores:     Nutrition Assessments - 10/02/16 1337      MEDFICTS Scores   Pre Score 41      Nutrition Goals Re-Evaluation:     Nutrition Goals Re-Evaluation    Row Name 10/19/16 0851 10/26/16 0914           Goals   Current Weight  - 217 lb 3.2 oz (98.5 kg)      Nutrition Goal  - -      Comment declined nutrition appointment [name] [name] wants to continue to lose weight and monitors his weight daily.  He is reading food labels and watching sodium intake.  He has lost around 30 lb.        Expected Outcome maintain heart healthy diet [name] [name] will continue to eat heart healthy diet and see continued weight management.         Nutrition Goals Discharge (Final Nutrition Goals Re-Evaluation):     Nutrition Goals Re-Evaluation - 10/26/16 0914      Goals   Current Weight 217 lb 3.2 oz (98.5 kg)   Nutrition Goal --   Comment [name] [name] wants to continue to lose weight and monitors his weight daily.  He is reading food labels and watching sodium intake.  He has lost around 30 lb.     Expected Outcome [name] [name] will continue to eat heart healthy diet and see continued weight management.      Psychosocial: Target Goals: Acknowledge presence or absence of significant depression and/or stress, maximize coping skills, provide positive support system. Participant is able to verbalize types and ability to use techniques and skills needed for reducing stress and depression.   Initial Review & Psychosocial Screening:      Initial Psych Review & Screening - 10/02/16 1359      Screening Interventions   Interventions Encouraged to exercise      Quality of Life Scores:      Quality of Life - 10/02/16 1336      Quality of Life Scores   Health/Function Pre 26.54 %   Socioeconomic Pre 26.64 %   Psych/Spiritual Pre 27.5 %   Family Pre 28.13 %   GLOBAL Pre 26.97 %      PHQ-9: Recent Review Flowsheet Data    Depression screen [facility] 2/9 10/02/2016   Decreased Interest 0   Down, Depressed, Hopeless 0   PHQ - 2 Score 0   Altered sleeping 0   Tired, decreased energy 0   Change in appetite 0   Feeling bad or failure about yourself  0   Trouble concentrating 0   Moving slowly or fidgety/restless 0   Suicidal thoughts 0   PHQ-9 Score 0     Interpretation of Total Score  Total Score Depression Severity:  1-4 = Minimal depression, 5-9 = Mild depression, 10-14 = Moderate depression, 15-19 = Moderately severe depression, 20-27 = Severe depression   Psychosocial Evaluation and Intervention:     Psychosocial Evaluation - 10/26/16 0858      Psychosocial Evaluation & Interventions   Interventions Relaxation education;Encouraged to exercise with the program and follow exercise prescription;Stress management education   Comments Counselor met with Mr. Kochan today [name] [name]) for initial psychosocial evaluation.  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  He also struggles with CHF and Diabetes.  [name] [name] has a strong support system with a spouse of 42 years; a son and daughter who live close by and he is  actively involved in his local church.   [name] [name] has some difficulty sleeping at times with approximately 4-5 hours per night - partically due to the fluid medications he takes.  [name] [name] denies a history of depression or anxiety; but therapist noticed he is on Celexa at this time.  He reports his mood is generally stable most of the time.  He has multiple stressors with his own health and that of  his spouse.  He has goals to lose weight while in this program and to increase  his stamina and strength.  He has already noticed some progress in these areas since the surgery.  [name] [name] plans to work out at home on his bike or treadmill.  Staff will continue to follow with [name] [name] throughout the course of this program.     Expected Outcomes [name] [name] will benefit from consistent exercise to achieve his stated goals.  He will also need to meet with the dietician to address his weight loss goals.  [name] [name] will be participating in the psychoeducational components of this program to learn better coping strategies to deal with his mood and stress concerns.     Continue Psychosocial Services  Follow up required by staff      Psychosocial Re-Evaluation:   Psychosocial Discharge (Final Psychosocial Re-Evaluation):   Vocational Rehabilitation: Provide vocational rehab assistance to qualifying candidates.   Vocational Rehab Evaluation & Intervention:     Vocational Rehab - 10/02/16 1351      Initial Vocational Rehab Evaluation & Intervention   Assessment shows need for Vocational Rehabilitation No      Education: Education Goals: Education classes will be provided on a weekly basis, covering required topics. Participant will state understanding/return demonstration of topics presented.  Learning Barriers/Preferences:     Learning Barriers/Preferences - 10/02/16 1349      Learning Barriers/Preferences   Learning Barriers Hearing   Learning Preferences Individual Instruction;Verbal Instruction      Education Topics: General Nutrition Guidelines/Fats and Fiber: -Group instruction provided by verbal, written material, models and posters to present the general guidelines for heart healthy nutrition. Gives an explanation and review of dietary fats and fiber.   Controlling Sodium/Reading Food Labels: -Group verbal and written material supporting the discussion of sodium use in heart healthy nutrition.  Review and explanation with models, verbal and written materials for utilization of the food label.   Cardiac Rehab from 11/07/2016 in [facility] Cardiac and Pulmonary Rehab  Date  10/24/16  Educator  PI  Instruction Review Code  2- meets goals/outcomes      Exercise Physiology & Risk Factors: - Group verbal and written instruction with models to review the exercise physiology of the cardiovascular system and associated critical values. Details cardiovascular disease risk factors and the goals associated with each risk factor.   Cardiac Rehab from 11/07/2016 in [facility] Cardiac and Pulmonary Rehab  Date  10/31/16  Educator  RG/AS  Instruction Review Code  2- meets goals/outcomes      Aerobic Exercise & Resistance Training: - Gives group verbal and written discussion on the health impact of inactivity. On the components of aerobic and resistive training programs and the benefits of this training and how to safely progress through these programs.   Cardiac Rehab from 11/07/2016 in [facility] Cardiac and Pulmonary Rehab  Date  11/02/16  Educator  AS  Instruction Review Code  2- meets goals/outcomes      Flexibility, Balance, General Exercise Guidelines: - Provides group verbal and written instruction on the benefits of flexibility  and balance training programs. Provides general exercise guidelines with specific guidelines to those with heart or lung disease. Demonstration and skill practice provided.   Cardiac Rehab from 11/07/2016 in [facility] Cardiac and Pulmonary Rehab  Date  11/07/16  Educator  AS  Instruction Review Code  2- meets goals/outcomes      Stress Management: - Provides group verbal and written instruction about the health risks of elevated stress, cause of high stress, and healthy ways to reduce stress.   Depression: - Provides group verbal and written instruction on the correlation between heart/lung disease and depressed mood, treatment options, and the stigmas associated with seeking  treatment.   Cardiac Rehab from 11/07/2016 in [facility] Cardiac and Pulmonary Rehab  Date  10/26/16  Educator  [facility]  Instruction Review Code  2- meets goals/outcomes      Anatomy & Physiology of the Heart: - Group verbal and written instruction and models provide basic cardiac anatomy and physiology, with the coronary electrical and arterial systems. Review of: AMI, Angina, Valve disease, Heart Failure, Cardiac Arrhythmia, Pacemakers, and the ICD.   Cardiac Procedures: - Group verbal and written instruction and models to describe the testing methods done to diagnose heart disease. Reviews the outcomes of the test results. Describes the treatment choices: Medical Management, Angioplasty, or Coronary Bypass Surgery.   Cardiac Medications: - Group verbal and written instruction to review commonly prescribed medications for heart disease. Reviews the medication, class of the drug, and side effects. Includes the steps to properly store meds and maintain the prescription regimen.   Cardiac Rehab from 11/07/2016 in [facility] Cardiac and Pulmonary Rehab  Date  10/05/16 [6/14 Part Two]  Educator  SB  Instruction Review Code  2- meets goals/outcomes      Go Sex-Intimacy & Heart Disease, Get SMART - Goal Setting: - Group verbal and written instruction through game format to discuss heart disease and the return to sexual intimacy. Provides group verbal and written material to discuss and apply goal setting through the application of the S.M.A.R.T. Method.   Other Matters of the Heart: - Provides group verbal, written materials and models to describe Heart Failure, Angina, Valve Disease, and Diabetes in the realm of heart disease. Includes description of the disease process and treatment options available to the cardiac patient.   Exercise & Equipment Safety: - Individual verbal instruction and demonstration of equipment use and safety with use of the equipment.   Cardiac Rehab from 11/07/2016 in [facility] Cardiac  and Pulmonary Rehab  Date  10/02/16  Educator  C. EnterkinRN  Instruction Review Code  1- partially meets, needs review/practice      Infection Prevention: - Provides verbal and written material to individual with discussion of infection control including proper hand washing and proper equipment cleaning during exercise session.   Cardiac Rehab from 11/07/2016 in [facility] Cardiac and Pulmonary Rehab  Date  10/02/16  Educator  C. Enterkin, RN  Instruction Review Code  2- meets goals/outcomes      Falls Prevention: - Provides verbal and written material to individual with discussion of falls prevention and safety.   Cardiac Rehab from 11/07/2016 in [facility] Cardiac and Pulmonary Rehab  Date  10/02/16  Educator  C. Enterkin,RN  Instruction Review Code  2- meets goals/outcomes      Diabetes: - Individual verbal and written instruction to review signs/symptoms of diabetes, desired ranges of glucose level fasting, after meals and with exercise. Advice that pre and post exercise glucose checks will be done for 3  sessions at entry of program.   Cardiac Rehab from 11/07/2016 in [facility] Cardiac and Pulmonary Rehab  Date  10/02/16  Educator  C. Enterkin,RN  Instruction Review Code  1- partially meets, needs review/practice       Knowledge Questionnaire Score:     Knowledge Questionnaire Score - 10/02/16 1337      Knowledge Questionnaire Score   Pre Score 21      Core Components/Risk Factors/Patient Goals at Admission:     Personal Goals and Risk Factors at Admission - 10/02/16 1358      Core Components/Risk Factors/Patient Goals on Admission    Weight Management Yes;Obesity;Weight Loss   Intervention Weight Management: Develop a combined nutrition and exercise program designed to reach desired caloric intake, while maintaining appropriate intake of nutrient and fiber, sodium and fats, and appropriate energy expenditure required for the weight goal.;Weight Management: Provide education and  appropriate resources to help participant work on and attain dietary goals.;Weight Management/Obesity: Establish reasonable short term and long term weight goals.;Obesity: Provide education and appropriate resources to help participant work on and attain dietary goals.   Admit Weight 176 lb 12.8 oz (80.2 kg)   Goal Weight: Short Term 171 lb (77.6 kg)   Goal Weight: Long Term 166 lb (75.3 kg)   Expected Outcomes Short Term: Continue to assess and modify interventions until short term weight is achieved;Long Term: Adherence to nutrition and physical activity/exercise program aimed toward attainment of established weight goal;Weight Loss: Understanding of general recommendations for a balanced deficit meal plan, which promotes 1-2 lb weight loss per week and includes a negative energy balance of [id number] kcal/d;Understanding recommendations for meals to include 15-35% energy as protein, 25-35% energy from fat, 35-60% energy from carbohydrates, less than 2101m of dietary cholesterol, 20-35 gm of total fiber daily;Understanding of distribution of calorie intake throughout the day with the consumption of 4-5 meals/snacks   Diabetes Yes   Intervention Provide education about signs/symptoms and action to take for hypo/hyperglycemia.;Provide education about proper nutrition, including hydration, and aerobic/resistive exercise prescription along with prescribed medications to achieve blood glucose in normal ranges: Fasting glucose 65-99 mg/dL   Expected Outcomes Short Term: Participant verbalizes understanding of the signs/symptoms and immediate care of hyper/hypoglycemia, proper foot care and importance of medication, aerobic/resistive exercise and nutrition plan for blood glucose control.;Long Term: Attainment of HbA1C < 7%.   Hypertension Yes   Intervention Provide education on lifestyle modifcations including regular physical activity/exercise, weight management, moderate sodium restriction and increased  consumption of fresh fruit, vegetables, and low fat dairy, alcohol moderation, and smoking cessation.;Monitor prescription use compliance.   Expected Outcomes Short Term: Continued assessment and intervention until BP is < 140/927mHG in hypertensive participants. < 130/8030mG in hypertensive participants with diabetes, heart failure or chronic kidney disease.;Long Term: Maintenance of blood pressure at goal levels.   Lipids Yes   Intervention Provide education and support for participant on nutrition & aerobic/resistive exercise along with prescribed medications to achieve LDL <75m42mDL >40mg21mExpected Outcomes Short Term: Participant states understanding of desired cholesterol values and is compliant with medications prescribed. Participant is following exercise prescription and nutrition guidelines.;Long Term: Cholesterol controlled with medications as prescribed, with individualized exercise RX and with personalized nutrition plan. Value goals: LDL < 75mg,76m > 40 mg.      Core Components/Risk Factors/Patient Goals Review:      Goals and Risk Factor Review    Row Name 10/26/16 [phone]  Core Components/Risk Factors/Patient Goals Review   Personal Goals Review Weight Management/Obesity;Heart Failure;Diabetes       Review [name] [name] wants to continue to lose weight and monitors his weight daily.  He is reading food labels and watching sodium intake.  He has lost around 30 lb.  His BG at home is between 110-150.  All readings taken here have been within acceptable ranges.       Expected Outcomes [name] [name] will continue to make healthy lifestyle choices and see continued improvement in his health status.           Core Components/Risk Factors/Patient Goals at Discharge (Final Review):      Goals and Risk Factor Review - 10/26/16 0918      Core Components/Risk Factors/Patient Goals Review   Personal Goals Review Weight Management/Obesity;Heart Failure;Diabetes   Review [name] [name] wants to  continue to lose weight and monitors his weight daily.  He is reading food labels and watching sodium intake.  He has lost around 30 lb.  His BG at home is between 110-150.  All readings taken here have been within acceptable ranges.   Expected Outcomes [name] [name] will continue to make healthy lifestyle choices and see continued improvement in his health status.       ITP Comments:     ITP Comments    Row Name 10/02/16 1357 10/11/16 0551 10/17/16 1329 11/08/16 0640     ITP Comments ITP Created after Cardiac Rehab informed consent signed during the Medical Review.  30 day review. Continue with ITP unless directed changes per Medical Director review.  New start to program Out since last review due to knee pain.  He was cleared to return by orthopedics today. 30 day review. Continue with ITP unless directed changes per Medical Director review          Comments:

## 2016-11-09 ENCOUNTER — Encounter: Payer: PPO | Admitting: *Deleted

## 2016-11-09 DIAGNOSIS — Z954 Presence of other heart-valve replacement: Secondary | ICD-10-CM | POA: Diagnosis not present

## 2016-11-09 DIAGNOSIS — Z952 Presence of prosthetic heart valve: Secondary | ICD-10-CM

## 2016-11-09 NOTE — Progress Notes (Signed)
Daily Session Note  Patient Details  Name: [name] [name] MRN: [id number] Date of Birth: [birth year] Referring Provider:     Cardiac Rehab from 10/02/2016 in [facility] Cardiac and Pulmonary Rehab  Referring Provider  [name] MD      Encounter Date: 11/09/2016  Check In:     Session Check In - 11/09/16 0919      Check-In   Staff Present Alberteen Sam, MA, ACSM RCEP, Exercise Physiologist;Amanda Oletta Darter, BA, ACSM CEP, Exercise Physiologist;Krista Frederico Hamman, RN BSN   Supervising physician immediately available to respond to emergencies See telemetry face sheet for immediately available ER MD   Medication changes reported     No   Fall or balance concerns reported    No   Warm-up and Cool-down Performed on first and last piece of equipment   Resistance Training Performed Yes   VAD Patient? No     Pain Assessment   Currently in Pain? No/denies   Multiple Pain Sites No         History  Smoking Status  . Former Smoker  Smokeless Tobacco  . Former Systems developer  . Quit date: 05/16/2004    Goals Met:  Independence with exercise equipment Exercise tolerated well No report of cardiac concerns or symptoms Strength training completed today  Goals Unmet:  Not Applicable  Comments: Pt able to follow exercise prescription today without complaint.  Will continue to monitor for progression.    Dr. Emily Filbert is Medical Director for [place] and [facility].

## 2016-11-13 DIAGNOSIS — E782 Mixed hyperlipidemia: Secondary | ICD-10-CM | POA: Diagnosis not present

## 2016-11-13 DIAGNOSIS — I10 Essential (primary) hypertension: Secondary | ICD-10-CM | POA: Diagnosis not present

## 2016-11-13 DIAGNOSIS — E119 Type 2 diabetes mellitus without complications: Secondary | ICD-10-CM | POA: Diagnosis not present

## 2016-11-14 ENCOUNTER — Encounter: Payer: Self-pay | Admitting: *Deleted

## 2016-11-14 ENCOUNTER — Telehealth: Payer: Self-pay | Admitting: *Deleted

## 2016-11-14 DIAGNOSIS — Z952 Presence of prosthetic heart valve: Secondary | ICD-10-CM

## 2016-11-14 NOTE — Telephone Encounter (Signed)
[name] [name] called to let us know that he was going to miss class this morning.  He said that he was having issues with his blood pressure running high.

## 2016-11-20 DIAGNOSIS — I10 Essential (primary) hypertension: Secondary | ICD-10-CM | POA: Diagnosis not present

## 2016-11-20 DIAGNOSIS — E1122 Type 2 diabetes mellitus with diabetic chronic kidney disease: Secondary | ICD-10-CM | POA: Diagnosis not present

## 2016-11-20 DIAGNOSIS — R809 Proteinuria, unspecified: Secondary | ICD-10-CM | POA: Diagnosis not present

## 2016-11-20 DIAGNOSIS — N184 Chronic kidney disease, stage 4 (severe): Secondary | ICD-10-CM | POA: Diagnosis not present

## 2016-11-20 DIAGNOSIS — N050 Unspecified nephritic syndrome with minor glomerular abnormality: Secondary | ICD-10-CM | POA: Diagnosis not present

## 2016-11-20 DIAGNOSIS — E119 Type 2 diabetes mellitus without complications: Secondary | ICD-10-CM | POA: Diagnosis not present

## 2016-11-20 DIAGNOSIS — E782 Mixed hyperlipidemia: Secondary | ICD-10-CM | POA: Diagnosis not present

## 2016-11-20 DIAGNOSIS — I129 Hypertensive chronic kidney disease with stage 1 through stage 4 chronic kidney disease, or unspecified chronic kidney disease: Secondary | ICD-10-CM | POA: Diagnosis not present

## 2016-11-21 DIAGNOSIS — Z952 Presence of prosthetic heart valve: Secondary | ICD-10-CM

## 2016-11-21 DIAGNOSIS — Z954 Presence of other heart-valve replacement: Secondary | ICD-10-CM | POA: Diagnosis not present

## 2016-11-21 NOTE — Progress Notes (Signed)
Daily Session Note  Patient Details  Name: Terrius Gentile MRN: [id number] Date of Birth: 09-08-1945 Referring Provider:     Cardiac Rehab from 10/02/2016 in [facility] Cardiac and Pulmonary Rehab  Referring Provider  [name] MD      Encounter Date: 11/21/2016  Check In:     Session Check In - 11/21/16 0832      Check-In   Location [facility]-Cardiac & Pulmonary Rehab   Staff Present Gerlene Burdock, RN, Vickki Hearing, BA, ACSM CEP, Exercise Physiologist;Krista Frederico Hamman, RN BSN   Supervising physician immediately available to respond to emergencies See telemetry face sheet for immediately available ER MD   Medication changes reported     Yes   Comments MD changed med to hydralazone and [place]   Warm-up and Cool-down Performed on first and last piece of equipment   Resistance Training Performed Yes   VAD Patient? No     Pain Assessment   Currently in Pain? No/denies         History  Smoking Status  . Former Smoker  Smokeless Tobacco  . Former Systems developer  . Quit date: 05/16/2004    Goals Met:  Independence with exercise equipment Exercise tolerated well No report of cardiac concerns or symptoms Strength training completed today  Goals Unmet:  Not Applicable  Comments: Pt able to follow exercise prescription today without complaint.  Will continue to monitor for progression.    Dr. Emily Filbert is Medical Director for [place] and [facility].

## 2016-11-22 DIAGNOSIS — J449 Chronic obstructive pulmonary disease, unspecified: Secondary | ICD-10-CM | POA: Diagnosis not present

## 2016-11-23 ENCOUNTER — Encounter: Payer: PPO | Attending: Cardiovascular Disease

## 2016-11-23 DIAGNOSIS — Z954 Presence of other heart-valve replacement: Secondary | ICD-10-CM | POA: Insufficient documentation

## 2016-11-28 ENCOUNTER — Encounter: Payer: PPO | Admitting: *Deleted

## 2016-11-28 DIAGNOSIS — Z952 Presence of prosthetic heart valve: Secondary | ICD-10-CM

## 2016-11-28 DIAGNOSIS — Z954 Presence of other heart-valve replacement: Secondary | ICD-10-CM | POA: Diagnosis not present

## 2016-11-28 NOTE — Progress Notes (Signed)
Daily Session Note  Patient Details  Name: [name] [name] MRN: [id number] Date of Birth: May 25, 1945 Referring Provider:     Cardiac Rehab from 10/02/2016 in [facility] Cardiac and Pulmonary Rehab  Referring Provider  [name] MD      Encounter Date: 11/28/2016  Check In:     Session Check In - 11/28/16 0819      Check-In   Location [facility]-Cardiac & Pulmonary Rehab   Staff Present Heath Lark, RN, BSN, CCRP;Jessica Luan Pulling, MA, ACSM RCEP, Exercise Physiologist;Krista Frederico Hamman, RN BSN   Supervising physician immediately available to respond to emergencies See telemetry face sheet for immediately available ER MD   Medication changes reported     No   Fall or balance concerns reported    No   Warm-up and Cool-down Performed on first and last piece of equipment   Resistance Training Performed Yes   VAD Patient? No     Pain Assessment   Currently in Pain? No/denies   Multiple Pain Sites No         History  Smoking Status  . Former Smoker  Smokeless Tobacco  . Former Systems developer  . Quit date: 05/16/2004    Goals Met:  Independence with exercise equipment Exercise tolerated well Personal goals reviewed No report of cardiac concerns or symptoms Strength training completed today  Goals Unmet:  Not Applicable  Comments: Pt able to follow exercise prescription today without complaint.  Will continue to monitor for progression.    Dr. Emily Filbert is Medical Director for [place] and [facility].

## 2016-11-29 DIAGNOSIS — J449 Chronic obstructive pulmonary disease, unspecified: Secondary | ICD-10-CM | POA: Diagnosis not present

## 2016-11-30 ENCOUNTER — Encounter: Payer: PPO | Admitting: *Deleted

## 2016-11-30 DIAGNOSIS — Z954 Presence of other heart-valve replacement: Secondary | ICD-10-CM | POA: Diagnosis not present

## 2016-11-30 DIAGNOSIS — Z952 Presence of prosthetic heart valve: Secondary | ICD-10-CM

## 2016-11-30 NOTE — Progress Notes (Signed)
Daily Session Note  Patient Details  Name: [name] [name] MRN: [id number] Date of Birth: 03-09-1946 Referring Provider:     Cardiac Rehab from 10/02/2016 in [facility] Cardiac and Pulmonary Rehab  Referring Provider  [name] MD      Encounter Date: 11/30/2016  Check In:     Session Check In - 11/30/16 0816      Check-In   Location [facility]-Cardiac & Pulmonary Rehab   Staff Present Alberteen Sam, MA, ACSM RCEP, Exercise Physiologist;Krista Frederico Hamman, RN BSN;Meredith Sherryll Burger, RN BSN   Supervising physician immediately available to respond to emergencies See telemetry face sheet for immediately available ER MD   Medication changes reported     No   Fall or balance concerns reported    No   Warm-up and Cool-down Performed on first and last piece of equipment   Resistance Training Performed Yes   VAD Patient? No     Pain Assessment   Currently in Pain? No/denies   Multiple Pain Sites No         History  Smoking Status  . Former Smoker  Smokeless Tobacco  . Former Systems developer  . Quit date: 05/16/2004    Goals Met:  Independence with exercise equipment Exercise tolerated well No report of cardiac concerns or symptoms Strength training completed today  Goals Unmet:  Not Applicable  Comments: Pt able to follow exercise prescription today without complaint.  Will continue to monitor for progression.    Dr. Emily Filbert is Medical Director for [place] and [facility].

## 2016-12-04 DIAGNOSIS — N050 Unspecified nephritic syndrome with minor glomerular abnormality: Secondary | ICD-10-CM | POA: Diagnosis not present

## 2016-12-04 DIAGNOSIS — E1122 Type 2 diabetes mellitus with diabetic chronic kidney disease: Secondary | ICD-10-CM | POA: Diagnosis not present

## 2016-12-04 DIAGNOSIS — I10 Essential (primary) hypertension: Secondary | ICD-10-CM | POA: Diagnosis not present

## 2016-12-04 DIAGNOSIS — R809 Proteinuria, unspecified: Secondary | ICD-10-CM | POA: Diagnosis not present

## 2016-12-04 DIAGNOSIS — N183 Chronic kidney disease, stage 3 (moderate): Secondary | ICD-10-CM | POA: Diagnosis not present

## 2016-12-04 DIAGNOSIS — N2581 Secondary hyperparathyroidism of renal origin: Secondary | ICD-10-CM | POA: Diagnosis not present

## 2016-12-05 ENCOUNTER — Encounter: Payer: PPO | Admitting: *Deleted

## 2016-12-05 DIAGNOSIS — Z954 Presence of other heart-valve replacement: Secondary | ICD-10-CM | POA: Diagnosis not present

## 2016-12-05 DIAGNOSIS — Z952 Presence of prosthetic heart valve: Secondary | ICD-10-CM

## 2016-12-05 NOTE — Progress Notes (Signed)
Daily Session Note  Patient Details  Name: Ben Habermann MRN: [id number] Date of Birth: 1945/07/08 Referring Provider:     Cardiac Rehab from 10/02/2016 in [facility] Cardiac and Pulmonary Rehab  Referring Provider  [name] MD      Encounter Date: 12/05/2016  Check In:     Session Check In - 12/05/16 0823      Check-In   Location [facility]-Cardiac & Pulmonary Rehab   Staff Present Heath Lark, RN, BSN, CCRP;[place] Attar Luan Pulling, MA, ACSM RCEP, Exercise Physiologist;Amanda Oletta Darter, BA, ACSM CEP, Exercise Physiologist   Supervising physician immediately available to respond to emergencies See telemetry face sheet for immediately available ER MD   Medication changes reported     No   Fall or balance concerns reported    No   Warm-up and Cool-down Performed on first and last piece of equipment   Resistance Training Performed Yes   VAD Patient? No     Pain Assessment   Currently in Pain? No/denies   Multiple Pain Sites No         History  Smoking Status  . Former Smoker  Smokeless Tobacco  . Former Systems developer  . Quit date: 05/16/2004    Goals Met:  Independence with exercise equipment Exercise tolerated well No report of cardiac concerns or symptoms Strength training completed today  Goals Unmet:  Not Applicable  Comments: Pt able to follow exercise prescription today without complaint.  Will continue to monitor for progression.    Dr. Emily Filbert is Medical Director for [place] and [facility].

## 2016-12-06 ENCOUNTER — Encounter: Payer: Self-pay | Admitting: *Deleted

## 2016-12-06 DIAGNOSIS — Z952 Presence of prosthetic heart valve: Secondary | ICD-10-CM

## 2016-12-06 NOTE — Progress Notes (Signed)
Cardiac Individual Treatment Plan  Patient Details  Name: [name] [name] MRN: [id number] Date of Birth: [birth year] Referring Provider:     Cardiac Rehab from 10/02/2016 in [facility] Cardiac and Pulmonary Rehab  Referring Provider  [name] MD      Initial Encounter Date:    Cardiac Rehab from 10/02/2016 in [facility] Cardiac and Pulmonary Rehab  Date  10/02/16  Referring Provider  [name] MD      Visit Diagnosis: S/P TAVR (transcatheter aortic valve replacement)  Patient's Home Medications on Admission:  Current Outpatient Prescriptions:  .  acetaminophen (TYLENOL) 325 MG tablet, Take 2 tablets (650 mg total) by mouth every 4 (four) hours as needed for mild pain (temp > 101.5)., Disp: , Rfl:  .  Ascorbic Acid (VITAMIN C) 1000 MG tablet, Take 1,000 mg by mouth daily., Disp: , Rfl:  .  aspirin EC 81 MG EC tablet, Take 1 tablet (81 mg total) by mouth daily., Disp: , Rfl:  .  budesonide (PULMICORT) 0.5 MG/2ML nebulizer solution, Take 2 mLs (0.5 mg total) by nebulization 2 (two) times daily. (Patient not taking: Reported on 09/05/2016), Disp: , Rfl: 12 .  citalopram (CELEXA) 20 MG tablet, Take 1 tablet (20 mg total) by mouth daily., Disp: , Rfl:  .  clopidogrel (PLAVIX) 75 MG tablet, Take 75 mg by mouth daily., Disp: , Rfl:  .  famotidine (PEPCID) 20 MG tablet, Take 1 tablet (20 mg total) by mouth at bedtime. (Patient not taking: Reported on 09/05/2016), Disp: , Rfl:  .  fenofibrate 160 MG tablet, Take 160 mg by mouth daily., Disp: , Rfl:  .  fluticasone (FLONASE) 50 MCG/ACT nasal spray, Place 2 sprays into both nostrils daily., Disp: , Rfl: 2 .  furosemide (LASIX) 40 MG tablet, Take 1 tablet (40 mg total) by mouth 2 (two) times daily. (Patient not taking: Reported on 09/05/2016), Disp: 30 tablet, Rfl:  .  gabapentin (NEURONTIN) 100 MG capsule, Take 2 capsules (200 mg total) by mouth 2 (two) times daily., Disp: , Rfl:  .  heparin 5000 UNIT/ML injection, Inject 1 mL (5,000 Units  total) into the skin every 8 (eight) hours. (Patient not taking: Reported on 09/05/2016), Disp: 1 mL, Rfl:  .  hydrALAZINE (APRESOLINE) 50 MG tablet, Take 50 mg by mouth 2 (two) times daily., Disp: , Rfl:  .  insulin aspart (NOVOLOG) 100 UNIT/ML injection, Inject 0-9 Units into the skin every 4 (four) hours. (Patient not taking: Reported on 09/05/2016), Disp: 10 mL, Rfl: 11 .  ipratropium-albuterol (DUONEB) 0.5-2.5 (3) MG/3ML SOLN, Take 3 mLs by nebulization every 6 (six) hours., Disp: 360 mL, Rfl:  .  methocarbamol (ROBAXIN) 750 MG tablet, Take 750 mg by mouth 4 (four) times daily., Disp: , Rfl:  .  metoprolol succinate (TOPROL-XL) 50 MG 24 hr tablet, Take 50 mg by mouth daily. Take with or immediately following a meal., Disp: , Rfl:  .  pantoprazole (PROTONIX) 40 MG tablet, Take 40 mg by mouth daily., Disp: , Rfl:  .  Potassium 95 MG TABS, Take by mouth., Disp: , Rfl:  .  sodium chloride (OCEAN) 0.65 % SOLN nasal spray, Place 1 spray into both nostrils as needed for congestion., Disp: , Rfl: 0 .  tamsulosin (FLOMAX) 0.4 MG CAPS capsule, Take 1 capsule (0.4 mg total) by mouth daily., Disp: 30 capsule, Rfl:  .  torsemide (DEMADEX) 20 MG tablet, Take 20 mg by mouth daily., Disp: , Rfl:   Past Medical History: Past Medical History:  Diagnosis Date  . CHF (congestive heart failure) (HCC)   . COPD (chronic obstructive pulmonary disease) (HCC)   . Diabetes mellitus without complication (HCC)   . Hypertension   . Kidney stones   . Renal disorder     Tobacco Use: History  Smoking Status  . Former Smoker  Smokeless Tobacco  . Former Neurosurgeon  . Quit date: 05/16/2004    Labs: Recent Review Flowsheet Data    Labs for ITP Cardiac and Pulmonary Rehab Latest Ref Rng & Units 07/26/2012 07/05/2016 07/06/2016   Cholestrol 0 - 200 mg/dL 242 - -   LDLCALC 0 - 400 mg/dL 34 - -   HDL 40 - 60 mg/dL 42(Q) - -   Trlycerides 0 - 200 mg/dL 828 - -   Hemoglobin A1U 4.2 - 6.3 % 6.2 - -   PHART 7.350 - 7.450 - 7.35  7.35   PCO2ART 32.0 - 48.0 mmHg - 49(H) 48   HCO3 20.0 - 28.0 mmol/L - 27.1 26.5   O2SAT % - 94.7 96.4       Exercise Target Goals:    Exercise Program Goal: Individual exercise prescription set with THRR, safety & activity barriers. Participant demonstrates ability to understand and report RPE using BORG scale, to self-measure pulse accurately, and to acknowledge the importance of the exercise prescription.  Exercise Prescription Goal: Starting with aerobic activity 30 plus minutes a day, 3 days per week for initial exercise prescription. Provide home exercise prescription and guidelines that participant acknowledges understanding prior to discharge.  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 HR 119 bpm     Max Ex. BP 146/74     2 Minute Post BP 128/64       Interval HR   Baseline HR 83     1 Minute HR 87     2 Minute HR 85     3 Minute HR 87     4 Minute HR 83     5 Minute HR 84     6 Minute HR 86     2 Minute Post HR 88     Interval Heart Rate? Yes       Interval Oxygen   Interval Oxygen? Yes     Baseline Oxygen Saturation % 94 %  95% on Room Air     Baseline Liters of Oxygen 2 L     1 Minute Oxygen Saturation % 87 %     1 Minute  Liters of Oxygen 0 L  Room Air     2 Minute Oxygen Saturation  % 85 %     2 Minute Liters of Oxygen 0 L     3 Minute Oxygen Saturation % 87 %     3 Minute Liters of Oxygen 0 L     4 Minute Oxygen Saturation % 83 %     4 Minute Liters of Oxygen 0 L     5 Minute Oxygen Saturation % 84 %     5 Minute Liters of Oxygen 0 L     6 Minute Oxygen Saturation % 86 %     6 Minute Liters of Oxygen 0 L     2 Minute Post Oxygen Saturation % 95 %     2 Minute Post Liters of Oxygen 0 L        Oxygen Initial Assessment:   Oxygen Re-Evaluation:     Oxygen Re-Evaluation    Row Name 11/28/16 0766 11/28/16 0940           Program Oxygen Prescription   Program Oxygen Prescription Continuous  -      Liters per minute 2  2-3 liters   -      Comments [name] [name] adjusts his oxygen according to his activity levels.  -        Home Oxygen   Home Oxygen Device  - Home Concentrator;Portable Concentrator      Sleep Oxygen Prescription  - Continuous;CPAP      Liters per minute  - 3      Home Exercise Oxygen Prescription  - Continuous      Liters per minute  - 2  uses 2-3 per activity      Home at Rest Exercise Oxygen Prescription  - Continuous      Liters per minute  - 2      Compliance with Home Oxygen Use  - Yes        Goals/Expected Outcomes   Short Term Goals  - To learn and understand importance of monitoring SPO2 with pulse oximeter and demonstrate accurate use of the pulse oximeter.;To Learn and understand importance of maintaining oxygen saturations>88%;To learn and demonstrate proper purse lipped breathing techniques or other breathing techniques.      Long  Term Goals  - Exhibits compliance with exercise, home and travel O2 prescription;Verbalizes importance of monitoring SPO2 with pulse oximeter and return demonstration;Maintenance of O2 saturations>88%;Exhibits proper breathing techniques, such as purse lipped breathing or other method taught during program session      Comments  - [name] [name] is compliant with his use of oxygen at home and during activities in  the program.  We reviewed the need to keep his saturation above 88%. He has a pulse oximeter at home and will bring it to a session to see demonstrate proper use of the oximeter.  He demonstrated great purse lipped nreathing technique.      Goals/Expected Outcomes  - [name] [name] will maintian oxygen stauration above 88% by monitoring his levels during activity and rest. Long term goal [name] [name] will continue to be compliant with his oxygen use.          Oxygen Discharge (Final Oxygen Re-Evaluation):     Oxygen Re-Evaluation - 11/28/16 0940      Home Oxygen   Home Oxygen Device Home Concentrator;Portable Concentrator   Sleep Oxygen Prescription Continuous;CPAP   Liters per minute 3   Home Exercise Oxygen Prescription Continuous   Liters per minute  2  uses 2-3 per activity   Home at Rest Exercise Oxygen Prescription Continuous   Liters per minute 2   Compliance with Home Oxygen Use Yes     Goals/Expected Outcomes   Short Term Goals To learn and understand importance of monitoring SPO2 with pulse oximeter and demonstrate accurate use of the pulse oximeter.;To Learn and understand importance of maintaining oxygen saturations>88%;To learn and demonstrate proper purse lipped breathing techniques or other breathing techniques.   Long  Term Goals Exhibits compliance with exercise, home and travel O2 prescription;Verbalizes importance of monitoring SPO2 with pulse oximeter and return demonstration;Maintenance of O2 saturations>88%;Exhibits proper breathing techniques, such as purse lipped breathing or other method taught during program session   Comments [name] [name] is compliant with his use of oxygen at home and during activities in the program.  We reviewed the need to keep his saturation above 88%. He has a pulse oximeter at home and will bring it to a session to see demonstrate proper use of the oximeter.  He demonstrated great purse lipped nreathing technique.   Goals/Expected Outcomes [name] [name] will maintian  oxygen stauration above 88% by monitoring his levels during activity and rest. Long term goal [name] [name] will continue to be compliant with his oxygen use.       Initial Exercise Prescription:     Initial Exercise Prescription - 10/02/16 1400      Date of Initial Exercise RX and Referring Provider   Date 10/02/16   Referring Provider [name] MD     Oxygen   Oxygen Continuous   Liters 2     Treadmill   MPH 1.6   Grade 0.5   Minutes 15   METs 2.34     Recumbant Bike   Level 1   RPM 25   Watts 7   Minutes 15   METs 2.2     T5 Nustep   Level 1   SPM 80   Minutes 15   METs 2     Prescription Details   Frequency (times per week) 2   Duration Progress to 45 minutes of aerobic exercise without signs/symptoms of physical distress     Intensity   THRR 40-80% of Max Heartrate 109-135   Ratings of Perceived Exertion 11-13   Perceived Dyspnea 0-4     Progression   Progression Continue to progress workloads to maintain intensity without signs/symptoms of physical distress.     Resistance Training   Training Prescription Yes   Weight 3 lbs   Reps 10-15      Perform Capillary Blood Glucose checks as needed.  Exercise Prescription Changes:     Exercise Prescription Changes    Row Name 10/02/16 1400 10/05/16 1000 10/24/16 1000 11/01/16 1400 11/15/16 1400     Response to Exercise   Blood Pressure (Admit) 146/74 128/56  - 132/76 148/72   Blood Pressure (Exercise) 146/74 138/70  - 160/80 146/74   Blood Pressure (Exit) 128/64 124/58  - 140/70 120/62   Heart Rate (Admit) 83 bpm 78 bpm  - 87 bpm 78 bpm   Heart Rate (Exercise) 119 bpm 109 bpm  - 111 bpm 117 bpm   Heart Rate (Exit) 88 bpm 67 bpm  - 78 bpm 71 bpm   Oxygen Saturation (Admit) 94 %  -  -  -  -   Oxygen Saturation (Exercise) 83 % 85 %  -  -  -   Oxygen Saturation (Exit) 95 %  -  -  -  -  Rating of Perceived Exertion (Exercise) 9 13  - 15 13   Perceived Dyspnea (Exercise) 3  -  -  -  -   Symptoms SOB,  knees felt weak none  - none none   Comments walk test results first full day of exercise  -  -  -   Duration  - Progress to 45 minutes of aerobic exercise without signs/symptoms of physical distress  - Progress to 45 minutes of aerobic exercise without signs/symptoms of physical distress Progress to 45 minutes of aerobic exercise without signs/symptoms of physical distress   Intensity  - THRR unchanged  - THRR unchanged THRR unchanged     Progression   Progression  - Continue to progress workloads to maintain intensity without signs/symptoms of physical distress.  - Continue to progress workloads to maintain intensity without signs/symptoms of physical distress. Continue to progress workloads to maintain intensity without signs/symptoms of physical distress.   Average METs  - 2.22  - 2.6 3.08     Resistance Training   Training Prescription  - Yes  - Yes Yes   Weight  - 3 lbs  - 4 lb 4 lb   Reps  - 10-15  - 10-15 10-15     Interval Training   Interval Training  - No  - No No     Oxygen   Oxygen  - Continuous  - Continuous Continuous   Liters  - 2  increased to 3 on treadmill  - 2 2     Treadmill   MPH  - 1.6  -  - 1.4   Grade  - 0.5  -  - 4   Minutes  - 15  -  - 15   METs  - 2.34  -  - 3.11     Recumbant Bike   Level  -  -  - 1 1   Watts  -  -  - 22 26   Minutes  -  -  - 15 15   METs  -  -  - 2.6 2.83     T5 Nustep   Level  - 1  -  - 1   Minutes  - 15  -  - 15   METs  - 2.1  -  - 3.3     Home Exercise Plan   Plans to continue exercise at  -  - Home (comment)  treadmill and bike  - Home (comment)  treadmill and bike   Frequency  -  - Add 2 additional days to program exercise sessions.  - Add 2 additional days to program exercise sessions.   Initial Home Exercises Provided  -  - 10/24/16  - 10/24/16   Row Name 11/30/16 1500             Response to Exercise   Blood Pressure (Admit) 136/64       Blood Pressure (Exercise) 126/56       Blood Pressure (Exit) 130/60        Heart Rate (Admit) 83 bpm       Heart Rate (Exercise) 102 bpm       Heart Rate (Exit) 71 bpm       Rating of Perceived Exertion (Exercise) 15       Symptoms none       Duration Progress to 45 minutes of aerobic exercise without signs/symptoms of physical distress       Intensity THRR unchanged  Progression   Progression Continue to progress workloads to maintain intensity without signs/symptoms of physical distress.       Average METs 2.75         Resistance Training   Training Prescription Yes       Weight 4 lb       Reps 10-15         Interval Training   Interval Training No         Oxygen   Oxygen Continuous       Liters 2         Treadmill   MPH 1.4       Grade 4       Minutes 15       METs 3.11         Recumbant Bike   Level 1       Watts 27       Minutes 15       METs 2.83         T5 Nustep   Level 1       Minutes 15       METs 2.3         Home Exercise Plan   Plans to continue exercise at Home (comment)  treadmill and bike       Frequency Add 2 additional days to program exercise sessions.       Initial Home Exercises Provided 10/24/16          Exercise Comments:     Exercise Comments    Row Name 10/05/16 1037           Exercise Comments First full day of exercise!  Patient was oriented to gym and equipment including functions, settings, policies, and procedures.  Patient's individual exercise prescription and treatment plan were reviewed.  All starting workloads were established based on the results of the 6 minute walk test done at initial orientation visit.  The plan for exercise progression was also introduced and progression will be customized based on patient's performance and goals.          Exercise Goals and Review:     Exercise Goals    Row Name 10/02/16 1445             Exercise Goals   Increase Physical Activity Yes       Intervention Provide advice, education, support and counseling about physical  activity/exercise needs.;Develop an individualized exercise prescription for aerobic and resistive training based on initial evaluation findings, risk stratification, comorbidities and participant's personal goals.       Expected Outcomes Achievement of increased cardiorespiratory fitness and enhanced flexibility, muscular endurance and strength shown through measurements of functional capacity and personal statement of participant.       Increase Strength and Stamina Yes       Intervention Provide advice, education, support and counseling about physical activity/exercise needs.;Develop an individualized exercise prescription for aerobic and resistive training based on initial evaluation findings, risk stratification, comorbidities and participant's personal goals.       Expected Outcomes Achievement of increased cardiorespiratory fitness and enhanced flexibility, muscular endurance and strength shown through measurements of functional capacity and personal statement of participant.          Exercise Goals Re-Evaluation :     Exercise Goals Re-Evaluation    Row Name 10/05/16 1037 10/17/16 1328 10/24/16 1011 11/01/16 1455 11/15/16 1358     Exercise Goal Re-Evaluation   Exercise Goals Review Increase Physical Activity  -  Increase Physical Activity;Increase Strenth and Stamina Increase Physical Activity;Increase Strenth and Stamina Increase Physical Activity;Increase Strenth and Stamina   Comments [name] [name] has completed his first full day of exercise!  We will continue to monitor his progression Out since last review due to knee pain.  He was cleared to return by orthopedics today. Reviewed home exercise with pt today.  Pt plans to walk and bike for exercise.  Reviewed THR, pulse, RPE, sign and symptoms, NTG use, and when to call 911 or MD.  Also discussed weather considerations and indoor options.  Pt voiced understanding. [name] [name] has increased his Pascal Lux on the bike and weight for strength work. [name] [name] has  continued to do well in rehab.  He is now up to 4% grade on the treadmill but he cannot go faster.  We will continue to monitor his progression.   Expected Outcomes Continue to come to classes to work on increasing physical activity.  - Short - start home exercise Long - continue to exercise independently [place] will continue to increase his worklaod and improve overall functional capacity.  [place] wil graduate and continue to exercise independently. Short: Work on increasing workload on stepper.  Long: Continue to work on independent exercise.    [place] Name 11/30/16 1521             Exercise Goal Re-Evaluation   Exercise Goals Review Increase Physical Activity;Increase Strenth and Stamina       Comments [name] [name] continues to do well in rehab.  He is up to 27 watts on the recumbent bike.  We will continue to monitor his progress.       Expected Outcomes Short: Increase workload on stepper.  Long: Continue to work towards more independent exercise.           Discharge Exercise Prescription (Final Exercise Prescription Changes):     Exercise Prescription Changes - 11/30/16 1500      Response to Exercise   Blood Pressure (Admit) 136/64   Blood Pressure (Exercise) 126/56   Blood Pressure (Exit) 130/60   Heart Rate (Admit) 83 bpm   Heart Rate (Exercise) 102 bpm   Heart Rate (Exit) 71 bpm   Rating of Perceived Exertion (Exercise) 15   Symptoms none   Duration Progress to 45 minutes of aerobic exercise without signs/symptoms of physical distress   Intensity THRR unchanged     Progression   Progression Continue to progress workloads to maintain intensity without signs/symptoms of physical distress.   Average METs 2.75     Resistance Training   Training Prescription Yes   Weight 4 lb   Reps 10-15     Interval Training   Interval Training No     Oxygen   Oxygen Continuous   Liters 2     Treadmill   MPH 1.4   Grade 4   Minutes 15   METs 3.11     Recumbant Bike   Level  1   Watts 27   Minutes 15   METs 2.83     T5 Nustep   Level 1   Minutes 15   METs 2.3     Home Exercise Plan   Plans to continue exercise at Home (comment)  treadmill and bike   Frequency Add 2 additional days to program exercise sessions.   Initial Home Exercises Provided 10/24/16      Nutrition:  Target Goals: Understanding of nutrition guidelines, daily intake of sodium '1500mg'$ , cholesterol '200mg'$ , calories 30% from fat  and 7% or less from saturated fats, daily to have 5 or more servings of fruits and vegetables.  Biometrics:     Pre Biometrics - 10/02/16 1445      Pre Biometrics   Height '5\' 6"'$  (1.676 m)   Weight 209 lb 6.4 oz (95 kg)   Waist Circumference 44 inches   Hip Circumference 39 inches   Waist to Hip Ratio 1.13 %   BMI (Calculated) 33.9   Single Leg Stand 30 seconds       Nutrition Therapy Plan and Nutrition Goals:     Nutrition Therapy & Goals - 11/28/16 0915      Nutrition Therapy   RD appointment defered Yes  Continues to defer visit with RD      Nutrition Discharge: Rate Your Plate Scores:     Nutrition Assessments - 10/02/16 1337      MEDFICTS Scores   Pre Score 41      Nutrition Goals Re-Evaluation:     Nutrition Goals Re-Evaluation    Row Name 10/19/16 0851 10/26/16 0914           Goals   Current Weight  - 217 lb 3.2 oz (98.5 kg)      Nutrition Goal  - -      Comment declined nutrition appointment [name] [name] wants to continue to lose weight and monitors his weight daily.  He is reading food labels and watching sodium intake.  He has lost around 30 lb.        Expected Outcome maintain heart healthy diet [name] [name] will continue to eat heart healthy diet and see continued weight management.         Nutrition Goals Discharge (Final Nutrition Goals Re-Evaluation):     Nutrition Goals Re-Evaluation - 10/26/16 0914      Goals   Current Weight 217 lb 3.2 oz (98.5 kg)   Nutrition Goal --   Comment [name] [name] wants to continue to lose  weight and monitors his weight daily.  He is reading food labels and watching sodium intake.  He has lost around 30 lb.     Expected Outcome [name] [name] will continue to eat heart healthy diet and see continued weight management.      Psychosocial: Target Goals: Acknowledge presence or absence of significant depression and/or stress, maximize coping skills, provide positive support system. Participant is able to verbalize types and ability to use techniques and skills needed for reducing stress and depression.   Initial Review & Psychosocial Screening:     Initial Psych Review & Screening - 10/02/16 1359      Screening Interventions   Interventions Encouraged to exercise      Quality of Life Scores:      Quality of Life - 10/02/16 1336      Quality of Life Scores   Health/Function Pre 26.54 %   Socioeconomic Pre 26.64 %   Psych/Spiritual Pre 27.5 %   Family Pre 28.13 %   GLOBAL Pre 26.97 %      PHQ-9: Recent Review Flowsheet Data    Depression screen [facility] 2/9 10/02/2016   Decreased Interest 0   Down, Depressed, Hopeless 0   PHQ - 2 Score 0   Altered sleeping 0   Tired, decreased energy 0   Change in appetite 0   Feeling bad or failure about yourself  0   Trouble concentrating 0   Moving slowly or fidgety/restless 0   Suicidal thoughts 0   PHQ-9 Score 0  Interpretation of Total Score  Total Score Depression Severity:  1-4 = Minimal depression, 5-9 = Mild depression, 10-14 = Moderate depression, 15-19 = Moderately severe depression, 20-27 = Severe depression   Psychosocial Evaluation and Intervention:     Psychosocial Evaluation - 10/26/16 0858      Psychosocial Evaluation & Interventions   Interventions Relaxation education;Encouraged to exercise with the program and follow exercise prescription;Stress management education   Comments Counselor met with [name] [name] today [name] [name]) for initial psychosocial evaluation.  He is a 71 year old who had valve replacement  several months ago.  He also struggles with CHF and Diabetes.  [name] [name] has a strong support system with a spouse of 68 years; a son and daughter who live close by and he is actively involved in his local church.   [name] [name] has some difficulty sleeping at times with approximately 4-5 hours per night - partically due to the fluid medications he takes.  [name] [name] denies a history of depression or anxiety; but therapist noticed he is on Celexa at this time.  He reports his mood is generally stable most of the time.  He has multiple stressors with his own health and that of his spouse.  He has goals to lose weight while in this program and to increase  his stamina and strength.  He has already noticed some progress in these areas since the surgery.  [name] [name] plans to work out at home on his bike or treadmill.  Staff will continue to follow with [name] [name] throughout the course of this program.     Expected Outcomes [name] [name] will benefit from consistent exercise to achieve his stated goals.  He will also need to meet with the dietician to address his weight loss goals.  [name] [name] will be participating in the psychoeducational components of this program to learn better coping strategies to deal with his mood and stress concerns.     Continue Psychosocial Services  Follow up required by staff      Psychosocial Re-Evaluation:     Psychosocial Re-Evaluation    [place] Name 11/28/16 [phone]             Psychosocial Re-Evaluation   Current issues with Current Sleep Concerns       Comments [name] [name] is doing well, is having some stress in past month over BP fluctuations.  Is seeing MD team to work on BP readings.  Med changes are helping, though not back to best range. Sees Kidney doctor next week.  Feels better since has gained better control with BP.        Expected Outcomes [name] [name] will be able to manage stress of BP readngs and working with his medical team to gain control.   Long term, BP under control and stress reduced.        Interventions  Encouraged to attend Cardiac Rehabilitation for the exercise;Relaxation education;Stress management education       Continue Psychosocial Services  Follow up required by staff          Psychosocial Discharge (Final Psychosocial Re-Evaluation):     Psychosocial Re-Evaluation - 11/28/16 0917      Psychosocial Re-Evaluation   Current issues with Current Sleep Concerns   Comments [name] [name] is doing well, is having some stress in past month over BP fluctuations.  Is seeing MD team to work on BP readings.  Med changes are helping, though not back to best range. Sees Kidney doctor next week.  Feels better since has gained better control with BP.  Expected Outcomes [name] [name] will be able to manage stress of BP readngs and working with his medical team to gain control.   Long term, BP under control and stress reduced.    Interventions Encouraged to attend Cardiac Rehabilitation for the exercise;Relaxation education;Stress management education   Continue Psychosocial Services  Follow up required by staff      Vocational Rehabilitation: Provide vocational rehab assistance to qualifying candidates.   Vocational Rehab Evaluation & Intervention:     Vocational Rehab - 10/02/16 1351      Initial Vocational Rehab Evaluation & Intervention   Assessment shows need for Vocational Rehabilitation No      Education: Education Goals: Education classes will be provided on a weekly basis, covering required topics. Participant will state understanding/return demonstration of topics presented.  Learning Barriers/Preferences:     Learning Barriers/Preferences - 10/02/16 1349      Learning Barriers/Preferences   Learning Barriers Hearing   Learning Preferences Individual Instruction;Verbal Instruction      Education Topics: General Nutrition Guidelines/Fats and Fiber: -Group instruction provided by verbal, written material, models and posters to present the general guidelines for heart healthy nutrition.  Gives an explanation and review of dietary fats and fiber.   Controlling Sodium/Reading Food Labels: -Group verbal and written material supporting the discussion of sodium use in heart healthy nutrition. Review and explanation with models, verbal and written materials for utilization of the food label.   Cardiac Rehab from 12/05/2016 in [facility] Cardiac and Pulmonary Rehab  Date  10/24/16  Educator  PI  Instruction Review Code  2- meets goals/outcomes      Exercise Physiology & Risk Factors: - Group verbal and written instruction with models to review the exercise physiology of the cardiovascular system and associated critical values. Details cardiovascular disease risk factors and the goals associated with each risk factor.   Cardiac Rehab from 12/05/2016 in [facility] Cardiac and Pulmonary Rehab  Date  10/31/16  Educator  RG/AS  Instruction Review Code  2- meets goals/outcomes      Aerobic Exercise & Resistance Training: - Gives group verbal and written discussion on the health impact of inactivity. On the components of aerobic and resistive training programs and the benefits of this training and how to safely progress through these programs.   Cardiac Rehab from 12/05/2016 in [facility] Cardiac and Pulmonary Rehab  Date  11/02/16  Educator  AS  Instruction Review Code  2- meets goals/outcomes      Flexibility, Balance, General Exercise Guidelines: - Provides group verbal and written instruction on the benefits of flexibility and balance training programs. Provides general exercise guidelines with specific guidelines to those with heart or lung disease. Demonstration and skill practice provided.   Cardiac Rehab from 12/05/2016 in [facility] Cardiac and Pulmonary Rehab  Date  11/07/16  Educator  AS  Instruction Review Code  2- meets goals/outcomes      Stress Management: - Provides group verbal and written instruction about the health risks of elevated stress, cause of high stress, and healthy ways to  reduce stress.   Depression: - Provides group verbal and written instruction on the correlation between heart/lung disease and depressed mood, treatment options, and the stigmas associated with seeking treatment.   Cardiac Rehab from 12/05/2016 in [facility] Cardiac and Pulmonary Rehab  Date  10/26/16  Educator  [facility]  Instruction Review Code  2- meets goals/outcomes      Anatomy & Physiology of the Heart: - Group verbal and written instruction and models provide basic  cardiac anatomy and physiology, with the coronary electrical and arterial systems. Review of: AMI, Angina, Valve disease, Heart Failure, Cardiac Arrhythmia, Pacemakers, and the ICD.   Cardiac Procedures: - Group verbal and written instruction and models to describe the testing methods done to diagnose heart disease. Reviews the outcomes of the test results. Describes the treatment choices: Medical Management, Angioplasty, or Coronary Bypass Surgery.   Cardiac Rehab from 12/05/2016 in [facility] Cardiac and Pulmonary Rehab  Date  11/21/16  Educator  CE  Instruction Review Code  2- meets goals/outcomes      Cardiac Medications: - Group verbal and written instruction to review commonly prescribed medications for heart disease. Reviews the medication, class of the drug, and side effects. Includes the steps to properly store meds and maintain the prescription regimen.   Cardiac Rehab from 12/05/2016 in [facility] Cardiac and Pulmonary Rehab  Date  11/28/16 [6/14 Part Two]  Educator  SB [8/7 Part One 8/9 Part Two]  Instruction Review Code  2- meets goals/outcomes      Go Sex-Intimacy & Heart Disease, Get SMART - Goal Setting: - Group verbal and written instruction through game format to discuss heart disease and the return to sexual intimacy. Provides group verbal and written material to discuss and apply goal setting through the application of the S.M.A.R.T. Method.   Cardiac Rehab from 12/05/2016 in [facility] Cardiac and Pulmonary Rehab  Date   11/21/16  Educator  CE  Instruction Review Code  2- meets goals/outcomes      Other Matters of the Heart: - Provides group verbal, written materials and models to describe Heart Failure, Angina, Valve Disease, and Diabetes in the realm of heart disease. Includes description of the disease process and treatment options available to the cardiac patient.   Exercise & Equipment Safety: - Individual verbal instruction and demonstration of equipment use and safety with use of the equipment.   Cardiac Rehab from 12/05/2016 in [facility] Cardiac and Pulmonary Rehab  Date  10/02/16  Educator  C. EnterkinRN  Instruction Review Code  1- partially meets, needs review/practice      Infection Prevention: - Provides verbal and written material to individual with discussion of infection control including proper hand washing and proper equipment cleaning during exercise session.   Cardiac Rehab from 12/05/2016 in [facility] Cardiac and Pulmonary Rehab  Date  10/02/16  Educator  C. Enterkin, RN  Instruction Review Code  2- meets goals/outcomes      Falls Prevention: - Provides verbal and written material to individual with discussion of falls prevention and safety.   Cardiac Rehab from 12/05/2016 in [facility] Cardiac and Pulmonary Rehab  Date  10/02/16  Educator  C. Enterkin,RN  Instruction Review Code  2- meets goals/outcomes      Diabetes: - Individual verbal and written instruction to review signs/symptoms of diabetes, desired ranges of glucose level fasting, after meals and with exercise. Advice that pre and post exercise glucose checks will be done for 3 sessions at entry of program.   Cardiac Rehab from 12/05/2016 in [facility] Cardiac and Pulmonary Rehab  Date  10/02/16  Educator  C. Enterkin,RN  Instruction Review Code  1- partially meets, needs review/practice       Knowledge Questionnaire Score:     Knowledge Questionnaire Score - 10/02/16 1337      Knowledge Questionnaire Score   Pre Score 21       Core Components/Risk Factors/Patient Goals at Admission:     Personal Goals and Risk Factors at Admission -  10/02/16 1358      Core Components/Risk Factors/Patient Goals on Admission    Weight Management Yes;Obesity;Weight Loss   Intervention Weight Management: Develop a combined nutrition and exercise program designed to reach desired caloric intake, while maintaining appropriate intake of nutrient and fiber, sodium and fats, and appropriate energy expenditure required for the weight goal.;Weight Management: Provide education and appropriate resources to help participant work on and attain dietary goals.;Weight Management/Obesity: Establish reasonable short term and long term weight goals.;Obesity: Provide education and appropriate resources to help participant work on and attain dietary goals.   Admit Weight 176 lb 12.8 oz (80.2 kg)   Goal Weight: Short Term 171 lb (77.6 kg)   Goal Weight: Long Term 166 lb (75.3 kg)   Expected Outcomes Short Term: Continue to assess and modify interventions until short term weight is achieved;Long Term: Adherence to nutrition and physical activity/exercise program aimed toward attainment of established weight goal;Weight Loss: Understanding of general recommendations for a balanced deficit meal plan, which promotes 1-2 lb weight loss per week and includes a negative energy balance of [phone] kcal/d;Understanding recommendations for meals to include 15-35% energy as protein, 25-35% energy from fat, 35-60% energy from carbohydrates, less than '200mg'$  of dietary cholesterol, 20-35 gm of total fiber daily;Understanding of distribution of calorie intake throughout the day with the consumption of 4-5 meals/snacks   Diabetes Yes   Intervention Provide education about signs/symptoms and action to take for hypo/hyperglycemia.;Provide education about proper nutrition, including hydration, and aerobic/resistive exercise prescription along with prescribed medications to  achieve blood glucose in normal ranges: Fasting glucose 65-99 mg/dL   Expected Outcomes Short Term: Participant verbalizes understanding of the signs/symptoms and immediate care of hyper/hypoglycemia, proper foot care and importance of medication, aerobic/resistive exercise and nutrition plan for blood glucose control.;Long Term: Attainment of HbA1C < 7%.   Hypertension Yes   Intervention Provide education on lifestyle modifcations including regular physical activity/exercise, weight management, moderate sodium restriction and increased consumption of fresh fruit, vegetables, and low fat dairy, alcohol moderation, and smoking cessation.;Monitor prescription use compliance.   Expected Outcomes Short Term: Continued assessment and intervention until BP is < 140/84m HG in hypertensive participants. < 130/879mHG in hypertensive participants with diabetes, heart failure or chronic kidney disease.;Long Term: Maintenance of blood pressure at goal levels.   Lipids Yes   Intervention Provide education and support for participant on nutrition & aerobic/resistive exercise along with prescribed medications to achieve LDL '70mg'$ , HDL >'40mg'$ .   Expected Outcomes Short Term: Participant states understanding of desired cholesterol values and is compliant with medications prescribed. Participant is following exercise prescription and nutrition guidelines.;Long Term: Cholesterol controlled with medications as prescribed, with individualized exercise RX and with personalized nutrition plan. Value goals: LDL < '70mg'$ , HDL > 40 mg.      Core Components/Risk Factors/Patient Goals Review:      Goals and Risk Factor Review    Row Name 10/26/16 [id number]/07/18 0933           Core Components/Risk Factors/Patient Goals Review   Personal Goals Review Weight Management/Obesity;Heart Failure;Diabetes Weight Management/Obesity;Diabetes;Lipids;Hypertension;Stress      Review PaEddie Dibblesants to continue to lose weight and monitors his  weight daily.  He is reading food labels and watching sodium intake.  He has lost around 30 lb.  His BG at home is between 110-150.  All readings taken here have been within acceptable ranges. PaEddie Dibblestates his BP has been up and down and that he is seeing his physicians to work  on getting better control. He is noticing improvement with his BPreadings, though not in normal range yet.  His last A1C was 5.7 %. His weight is at 210 lbs, lower than entry weight. He states that he feels like he is making progress and that he continues to see his group of physicians to keep his risk factors in control.  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 [name] [name] continues to read food labels and watch his soduim intake,. He remains compliant with his medications      Expected Outcomes [name] [name] will continue to make healthy lifestyle choices and see continued improvement in his health status.  [name] [name] will continue to make healthy lifestyle choices and see continued improvement in his health status. Short term goal: [name] [name] will continue to work with his medical team to gain better control of his blood pressure.          Core Components/Risk Factors/Patient Goals at Discharge (Final Review):      Goals and Risk Factor Review - 11/28/16 0933      Core Components/Risk Factors/Patient Goals Review   Personal Goals Review Weight Management/Obesity;Diabetes;Lipids;Hypertension;Stress   Review [name] [name] states his BP has been up and down and that he is seeing his physicians to work on getting better control. He is noticing improvement with his BPreadings, though not in normal range yet.  His last A1C was 5.7 %. His weight is at 210 lbs, lower than entry weight. He states that he feels like he is making progress and that he continues to see his group of physicians to keep his risk factors in control.  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 [name] [name] continues to read food labels and watch his soduim intake,. He remains  compliant with his medications   Expected Outcomes [name] [name] will continue to make healthy lifestyle choices and see continued improvement in his health status. Short term goal: [name] [name] will continue to work with his medical team to gain better control of his blood pressure.       ITP Comments:     ITP Comments    Row Name 10/02/16 1357 10/11/16 0551 10/17/16 1329 11/08/16 0640 11/14/16 0924   ITP Comments ITP Created after Cardiac Rehab informed consent signed during the Medical Review.  30 day review. Continue with ITP unless directed changes per Medical Director review.  New start to program Out since last review due to knee pain.  He was cleared to return by orthopedics today. 30 day review. Continue with ITP unless directed changes per Medical Director review    [name] [name] called to let us know that he was going to miss class this morning.  He said that he was having issues with his blood pressure running high.   [place] Name 12/06/16 [phone]           ITP Comments 30 day review. Continue with ITP unless directed changes per Medical Director review           Comments:

## 2016-12-07 DIAGNOSIS — Z952 Presence of prosthetic heart valve: Secondary | ICD-10-CM

## 2016-12-07 DIAGNOSIS — Z954 Presence of other heart-valve replacement: Secondary | ICD-10-CM | POA: Diagnosis not present

## 2016-12-07 NOTE — Progress Notes (Signed)
Daily Session Note  Patient Details  Name: Gevorg Brum MRN: [id number] Date of Birth: [birth year] Referring Provider:     Cardiac Rehab from 10/02/2016 in [facility] Cardiac and Pulmonary Rehab  Referring Provider  [name] MD      Encounter Date: 12/07/2016  Check In:     Session Check In - 12/07/16 0846      Check-In   Location [facility]-Cardiac & Pulmonary Rehab   Staff Present Alberteen Sam, MA, ACSM RCEP, Exercise Physiologist;Amanda Oletta Darter, BA, ACSM CEP, Exercise Physiologist;Meredith Sherryll Burger, RN BSN   Supervising physician immediately available to respond to emergencies See telemetry face sheet for immediately available ER MD   Medication changes reported     No   Fall or balance concerns reported    No   Warm-up and Cool-down Performed on first and last piece of equipment   Resistance Training Performed Yes   VAD Patient? No     Pain Assessment   Currently in Pain? No/denies         History  Smoking Status  . Former Smoker  Smokeless Tobacco  . Former Systems developer  . Quit date: 05/16/2004    Goals Met:  Independence with exercise equipment Exercise tolerated well No report of cardiac concerns or symptoms Strength training completed today  Goals Unmet:  Not Applicable  Comments: Pt able to follow exercise prescription today without complaint.  Will continue to monitor for progression.    Dr. Emily Filbert is Medical Director for [place] and [facility].

## 2016-12-12 DIAGNOSIS — Z954 Presence of other heart-valve replacement: Secondary | ICD-10-CM | POA: Diagnosis not present

## 2016-12-12 DIAGNOSIS — Z952 Presence of prosthetic heart valve: Secondary | ICD-10-CM

## 2016-12-12 NOTE — Progress Notes (Signed)
Daily Session Note  Patient Details  Name: [name] [name] MRN: [id number] Date of Birth: 07/18/1945 Referring Provider:     Cardiac Rehab from 10/02/2016 in [facility] Cardiac and Pulmonary Rehab  Referring Provider  [name] MD      Encounter Date: 12/12/2016  Check In:     Session Check In - 12/12/16 0828      Check-In   Location [facility]-Cardiac & Pulmonary Rehab   Staff Present Heath Lark, RN, BSN, CCRP;Jessica Luan Pulling, MA, ACSM RCEP, Exercise Physiologist;Amanda Oletta Darter, BA, ACSM CEP, Exercise Physiologist   Supervising physician immediately available to respond to emergencies See telemetry face sheet for immediately available ER MD   Medication changes reported     No   Fall or balance concerns reported    No   Warm-up and Cool-down Performed on first and last piece of equipment   Resistance Training Performed Yes   VAD Patient? No     Pain Assessment   Currently in Pain? No/denies   Multiple Pain Sites No         History  Smoking Status  . Former Smoker  Smokeless Tobacco  . Former Systems developer  . Quit date: 05/16/2004    Goals Met:  Independence with exercise equipment Exercise tolerated well No report of cardiac concerns or symptoms Strength training completed today  Goals Unmet:  Not Applicable  Comments: Pt able to follow exercise prescription today without complaint.  Will continue to monitor for progression.    Dr. Emily Filbert is Medical Director for [place] and [facility].

## 2016-12-18 DIAGNOSIS — I2510 Atherosclerotic heart disease of native coronary artery without angina pectoris: Secondary | ICD-10-CM | POA: Diagnosis not present

## 2016-12-18 DIAGNOSIS — E119 Type 2 diabetes mellitus without complications: Secondary | ICD-10-CM | POA: Diagnosis not present

## 2016-12-18 DIAGNOSIS — G4739 Other sleep apnea: Secondary | ICD-10-CM | POA: Diagnosis not present

## 2016-12-18 DIAGNOSIS — E782 Mixed hyperlipidemia: Secondary | ICD-10-CM | POA: Diagnosis not present

## 2016-12-18 DIAGNOSIS — R0602 Shortness of breath: Secondary | ICD-10-CM | POA: Diagnosis not present

## 2016-12-18 DIAGNOSIS — I10 Essential (primary) hypertension: Secondary | ICD-10-CM | POA: Diagnosis not present

## 2016-12-19 ENCOUNTER — Encounter: Payer: PPO | Admitting: *Deleted

## 2016-12-19 DIAGNOSIS — Z952 Presence of prosthetic heart valve: Secondary | ICD-10-CM

## 2016-12-19 DIAGNOSIS — Z954 Presence of other heart-valve replacement: Secondary | ICD-10-CM | POA: Diagnosis not present

## 2016-12-19 NOTE — Progress Notes (Signed)
Daily Session Note  Patient Details  Name: [name] [name] MRN: [id number] Date of Birth: December 06, 1945 Referring Provider:     Cardiac Rehab from 10/02/2016 in [facility] Cardiac and Pulmonary Rehab  Referring Provider  [name] MD      Encounter Date: 12/19/2016  Check In:     Session Check In - 12/19/16 0839      Check-In   Location [facility]-Cardiac & Pulmonary Rehab   Staff Present Heath Lark, RN, BSN, CCRP;Jessica Luan Pulling, MA, ACSM RCEP, Exercise Physiologist;Amanda Oletta Darter, BA, ACSM CEP, Exercise Physiologist;Lisandra Mathisen Flavia Shipper   Supervising physician immediately available to respond to emergencies See telemetry face sheet for immediately available ER MD   Medication changes reported     No   Fall or balance concerns reported    No   Warm-up and Cool-down Performed on first and last piece of equipment   Resistance Training Performed Yes   VAD Patient? No     Pain Assessment   Currently in Pain? No/denies   Multiple Pain Sites No         History  Smoking Status  . Former Smoker  Smokeless Tobacco  . Former Systems developer  . Quit date: 05/16/2004    Goals Met:  Independence with exercise equipment Exercise tolerated well No report of cardiac concerns or symptoms Strength training completed today  Goals Unmet:  Not Applicable  Comments: Pt able to follow exercise prescription today without complaint.  Will continue to monitor for progression.   Dr. Emily Filbert is Medical Director for [place] and [facility].

## 2016-12-21 DIAGNOSIS — Z954 Presence of other heart-valve replacement: Secondary | ICD-10-CM | POA: Diagnosis not present

## 2016-12-21 DIAGNOSIS — Z952 Presence of prosthetic heart valve: Secondary | ICD-10-CM

## 2016-12-21 NOTE — Progress Notes (Signed)
Daily Session Note  Patient Details  Name: [name] [name] MRN: [id number] Date of Birth: October 28, 1945 Referring Provider:     Cardiac Rehab from 10/02/2016 in [facility] Cardiac and Pulmonary Rehab  Referring Provider  [name] MD      Encounter Date: 12/21/2016  Check In:     Session Check In - 12/21/16 0919      Check-In   Location [facility]-Cardiac & Pulmonary Rehab   Staff Present Alberteen Sam, MA, ACSM RCEP, Exercise Physiologist;Emad Brechtel Oletta Darter, BA, ACSM CEP, Exercise Physiologist;Krista Frederico Hamman, RN BSN   Supervising physician immediately available to respond to emergencies See telemetry face sheet for immediately available ER MD   Medication changes reported     No   Fall or balance concerns reported    No   Warm-up and Cool-down Performed on first and last piece of equipment   Resistance Training Performed Yes   VAD Patient? No     Pain Assessment   Currently in Pain? No/denies         History  Smoking Status  . Former Smoker  Smokeless Tobacco  . Former Systems developer  . Quit date: 05/16/2004    Goals Met:  Independence with exercise equipment Exercise tolerated well No report of cardiac concerns or symptoms Strength training completed today  Goals Unmet:  Not Applicable  Comments: Pt able to follow exercise prescription today without complaint.  Will continue to monitor for progression.   Dr. Emily Filbert is Medical Director for [place] and [facility].

## 2016-12-23 DIAGNOSIS — J449 Chronic obstructive pulmonary disease, unspecified: Secondary | ICD-10-CM | POA: Diagnosis not present

## 2016-12-26 ENCOUNTER — Encounter: Payer: PPO | Attending: Cardiovascular Disease

## 2016-12-26 VITALS — Ht 66.0 in | Wt 210.0 lb

## 2016-12-26 DIAGNOSIS — Z954 Presence of other heart-valve replacement: Secondary | ICD-10-CM | POA: Insufficient documentation

## 2016-12-26 DIAGNOSIS — Z952 Presence of prosthetic heart valve: Secondary | ICD-10-CM

## 2016-12-26 NOTE — Progress Notes (Signed)
Daily Session Note  Patient Details  Name: [name] [name] MRN: [id number] Date of Birth: 09-04-1945 Referring Provider:     Cardiac Rehab from 10/02/2016 in [facility] Cardiac and Pulmonary Rehab  Referring Provider  [name] MD      Encounter Date: 12/26/2016  Check In:     Session Check In - 12/26/16 0916      Check-In   Location [facility]-Cardiac & Pulmonary Rehab   Staff Present Heath Lark, RN, BSN, CCRP;Jessica Luan Pulling, MA, ACSM RCEP, Exercise Physiologist;Yoshie Kosel Oletta Darter, BA, ACSM CEP, Exercise Physiologist   Supervising physician immediately available to respond to emergencies See telemetry face sheet for immediately available ER MD   Medication changes reported     No   Fall or balance concerns reported    No   Warm-up and Cool-down Performed on first and last piece of equipment   Resistance Training Performed Yes   VAD Patient? No     Pain Assessment   Currently in Pain? No/denies         History  Smoking Status  . Former Smoker  Smokeless Tobacco  . Former Systems developer  . Quit date: 05/16/2004    Goals Met:  Independence with exercise equipment Exercise tolerated well No report of cardiac concerns or symptoms Strength training completed today  Goals Unmet:  Not Applicable  Comments:      [place] Name 10/02/16 1436 12/26/16 0918       6 Minute Walk   Phase Initial Discharge    Distance 1020 feet 1360 feet    Distance % Change  - 33 %    Distance Feet Change  - 340 ft    Walk Time 6 minutes 6 minutes    # of Rest Breaks 0 0    MPH 1.93 2.57    METS 2.24 2.59    RPE 9 15    Perceived Dyspnea  3  -    VO2 Peak 7.86 9.07    Symptoms Yes (comment) Yes (comment)    Comments SOB, knees felt weak legs feel weak    Resting HR 83 bpm 68 bpm    Resting BP 146/74 128/56    Resting Oxygen Saturation   - 96 %    Exercise Oxygen Saturation  during 6 min walk  - 88 %    Max Ex. HR 119 bpm 124 bpm    Max Ex. BP 146/74 192/68    2 Minute Post  BP 128/64  -      Interval HR   Baseline HR (retired) 83  -    1 Minute HR 87 90    2 Minute HR 85 108    3 Minute HR 87 116    4 Minute HR 83 122    5 Minute HR 84 124    6 Minute HR 86 122    2 Minute Post HR 88  -    Interval Heart Rate? Yes  -      Interval Oxygen   Interval Oxygen? Yes Yes    Baseline Oxygen Saturation % 94 %  95% on Room Air 96 %    Resting Liters of Oxygen 2 L  -    1 Minute Oxygen Saturation % 87 % 93 %    1 Minute Liters of Oxygen 0 L  Room Air 3 L    2 Minute Oxygen Saturation % 85 % 90 %    2 Minute Liters of  Oxygen 0 L 3 L    3 Minute Oxygen Saturation % 87 % 89 %    3 Minute Liters of Oxygen 0 L 3 L    4 Minute Oxygen Saturation % 83 % 88 %    4 Minute Liters of Oxygen 0 L 3 L    5 Minute Oxygen Saturation % 84 % 88 %    5 Minute Liters of Oxygen 0 L 3 L    6 Minute Oxygen Saturation % 86 % 88 %    6 Minute Liters of Oxygen 0 L 3 L    2 Minute Post Oxygen Saturation % 95 %  -    2 Minute Post Liters of Oxygen 0 L 3 L         Dr. Mark Miller is Medical Director for [facility] and [facility]. 

## 2016-12-28 DIAGNOSIS — Z954 Presence of other heart-valve replacement: Secondary | ICD-10-CM | POA: Diagnosis not present

## 2016-12-28 DIAGNOSIS — Z952 Presence of prosthetic heart valve: Secondary | ICD-10-CM

## 2016-12-28 NOTE — Progress Notes (Signed)
Daily Session Note  Patient Details  Name: [name] [name] MRN: [id number] Date of Birth: 05-08-1945 Referring Provider:     Cardiac Rehab from 10/02/2016 in [facility] Cardiac and Pulmonary Rehab  Referring Provider  [name] MD      Encounter Date: 12/28/2016  Check In:     Session Check In - 12/28/16 0833      Check-In   Location [facility]-Cardiac & Pulmonary Rehab   Staff Present Alberteen Sam, MA, ACSM RCEP, Exercise Physiologist;Rodarius Kichline Oletta Darter, BA, ACSM CEP, Exercise Physiologist;Meredith Sherryll Burger, RN BSN   Supervising physician immediately available to respond to emergencies See telemetry face sheet for immediately available ER MD   Medication changes reported     No   Fall or balance concerns reported    No   Warm-up and Cool-down Performed on first and last piece of equipment   Resistance Training Performed Yes   VAD Patient? No     Pain Assessment   Currently in Pain? No/denies         History  Smoking Status  . Former Smoker  Smokeless Tobacco  . Former Systems developer  . Quit date: 05/16/2004    Goals Met:  Independence with exercise equipment Exercise tolerated well No report of cardiac concerns or symptoms Strength training completed today  Goals Unmet:  Not Applicable  Comments: Pt able to follow exercise prescription today without complaint.  Will continue to monitor for progression.    Dr. Emily Filbert is Medical Director for [place] and [facility].

## 2016-12-30 DIAGNOSIS — J449 Chronic obstructive pulmonary disease, unspecified: Secondary | ICD-10-CM | POA: Diagnosis not present

## 2017-01-02 DIAGNOSIS — Z954 Presence of other heart-valve replacement: Secondary | ICD-10-CM | POA: Diagnosis not present

## 2017-01-02 DIAGNOSIS — Z952 Presence of prosthetic heart valve: Secondary | ICD-10-CM

## 2017-01-02 NOTE — Progress Notes (Signed)
Daily Session Note  Patient Details  Name: [name] [name] MRN: [id number] Date of Birth: 05/11/1945 Referring Provider:     Cardiac Rehab from 10/02/2016 in [facility] Cardiac and Pulmonary Rehab  Referring Provider  [name] MD      Encounter Date: 01/02/2017  Check In:     Session Check In - 01/02/17 0843      Check-In   Location [facility]-Cardiac & Pulmonary Rehab   Staff Present Heath Lark, RN, BSN, CCRP;Jessica Luan Pulling, MA, ACSM RCEP, Exercise Physiologist;Amanda Oletta Darter, BA, ACSM CEP, Exercise Physiologist   Supervising physician immediately available to respond to emergencies See telemetry face sheet for immediately available ER MD   Medication changes reported     No   Fall or balance concerns reported    No   Warm-up and Cool-down Performed on first and last piece of equipment   Resistance Training Performed Yes   VAD Patient? No     Pain Assessment   Currently in Pain? No/denies         History  Smoking Status  . Former Smoker  Smokeless Tobacco  . Former Systems developer  . Quit date: 05/16/2004    Goals Met:  Independence with exercise equipment Exercise tolerated well No report of cardiac concerns or symptoms Strength training completed today  Goals Unmet:  Not Applicable  Comments: Pt able to follow exercise prescription today without complaint.  Will continue to monitor for progression.    Dr. Emily Filbert is Medical Director for [place] and [facility].

## 2017-01-02 NOTE — Patient Instructions (Signed)
Discharge Instructions  Patient Details  Name: [name] [name] MRN: [id number] Date of Birth: Oct 14, 1945 Referring Provider:  Dionisio David, MD   Number of Visits: 36/36  Reason for Discharge:  Patient reached a stable level of exercise. Patient independent in their exercise. Patient has met program and personal goals.  Smoking History:  History  Smoking Status  . Former Smoker  Smokeless Tobacco  . Former Systems developer  . Quit date: 05/16/2004    Diagnosis:  S/P TAVR (transcatheter aortic valve replacement)  Initial Exercise Prescription:     Initial Exercise Prescription - 10/02/16 1400      Date of Initial Exercise RX and Referring Provider   Date 10/02/16   Referring Provider [name] MD     Oxygen   Oxygen Continuous   Liters 2     Treadmill   MPH 1.6   Grade 0.5   Minutes 15   METs 2.34     Recumbant Bike   Level 1   RPM 25   Watts 7   Minutes 15   METs 2.2     T5 Nustep   Level 1   SPM 80   Minutes 15   METs 2     Prescription Details   Frequency (times per week) 2   Duration Progress to 45 minutes of aerobic exercise without signs/symptoms of physical distress     Intensity   THRR 40-80% of Max Heartrate 109-135   Ratings of Perceived Exertion 11-13   Perceived Dyspnea 0-4     Progression   Progression Continue to progress workloads to maintain intensity without signs/symptoms of physical distress.     Resistance Training   Training Prescription Yes   Weight 3 lbs   Reps 10-15      Discharge Exercise Prescription (Final Exercise Prescription Changes):     Exercise Prescription Changes - 12/26/16 1500      Response to Exercise   Blood Pressure (Admit) 138/58   Blood Pressure (Exercise) 192/68   Blood Pressure (Exit) 122/58   Heart Rate (Admit) 90 bpm   Heart Rate (Exercise) 124 bpm   Heart Rate (Exit) 78 bpm   Rating of Perceived Exertion (Exercise) 15   Symptoms none   Duration Continue with 45 min of aerobic  exercise without signs/symptoms of physical distress.   Intensity THRR unchanged     Progression   Progression Continue to progress workloads to maintain intensity without signs/symptoms of physical distress.   Average METs 3.01     Resistance Training   Training Prescription Yes   Weight 4 lb   Reps 10-15     Interval Training   Interval Training No     Oxygen   Oxygen Continuous   Liters 2     Treadmill   MPH 2.4   Grade 0.5   Minutes 15   METs 3     Recumbant Bike   Level 1   Watts 25   Minutes 15   METs 2.83     T5 Nustep   Level 3   Minutes 15   METs 3.2     Home Exercise Plan   Plans to continue exercise at Home (comment)  treadmill and bike   Frequency Add 2 additional days to program exercise sessions.   Initial Home Exercises Provided 10/24/16      Functional Capacity:     6 Minute Walk    [place] Name 10/02/16 1436 12/26/16 [phone]  6 Minute Walk   Phase Initial Discharge    Distance 1020 feet 1360 feet    Distance % Change  - 33 %    Distance Feet Change  - 340 ft    Walk Time 6 minutes 6 minutes    # of Rest Breaks 0 0    MPH 1.93 2.57    METS 2.24 2.59    RPE 9 15    Perceived Dyspnea  3  -    VO2 Peak 7.86 9.07    Symptoms Yes (comment) Yes (comment)    Comments SOB, knees felt weak legs feel weak    Resting HR 83 bpm 68 bpm    Resting BP 146/74 128/56    Resting Oxygen Saturation   - 96 %    Exercise Oxygen Saturation  during 6 min walk  - 88 %    Max Ex. HR 119 bpm 124 bpm    Max Ex. BP 146/74 192/68    2 Minute Post BP 128/64  -      Interval HR   Baseline HR (retired) 83  -    1 Minute HR 87 90    2 Minute HR 85 108    3 Minute HR 87 116    4 Minute HR 83 122    5 Minute HR 84 124    6 Minute HR 86 122    2 Minute Post HR 88  -    Interval Heart Rate? Yes  -      Interval Oxygen   Interval Oxygen? Yes Yes    Baseline Oxygen Saturation % 94 %  95% on Room Air 96 %    Resting Liters of Oxygen 2 L  -    1 Minute  Oxygen Saturation % 87 % 93 %    1 Minute Liters of Oxygen 0 L  Room Air 3 L    2 Minute Oxygen Saturation % 85 % 90 %    2 Minute Liters of Oxygen 0 L 3 L    3 Minute Oxygen Saturation % 87 % 89 %    3 Minute Liters of Oxygen 0 L 3 L    4 Minute Oxygen Saturation % 83 % 88 %    4 Minute Liters of Oxygen 0 L 3 L    5 Minute Oxygen Saturation % 84 % 88 %    5 Minute Liters of Oxygen 0 L 3 L    6 Minute Oxygen Saturation % 86 % 88 %    6 Minute Liters of Oxygen 0 L 3 L    2 Minute Post Oxygen Saturation % 95 %  -    2 Minute Post Liters of Oxygen 0 L 3 L       Quality of Life:     Quality of Life - 01/02/17 0955      Quality of Life Scores   Health/Function Pre 26.54 %   Health/Function Post 26.47 %   Health/Function % Change -0.26 %   Socioeconomic Pre 26.64 %   Socioeconomic Post 26 %   Socioeconomic % Change  -2.4 %   Psych/Spiritual Pre 27.5 %   Psych/Spiritual Post 27.07 %   Psych/Spiritual % Change -1.56 %   Family Pre 28.13 %   Family Post 28.5 %   Family % Change 1.32 %   GLOBAL Pre 26.97 %   GLOBAL Post 26.77 %   GLOBAL % Change -0.74 %  Personal Goals: Goals established at orientation with interventions provided to work toward goal.     Personal Goals and Risk Factors at Admission - 10/02/16 1358      Core Components/Risk Factors/Patient Goals on Admission    Weight Management Yes;Obesity;Weight Loss   Intervention Weight Management: Develop a combined nutrition and exercise program designed to reach desired caloric intake, while maintaining appropriate intake of nutrient and fiber, sodium and fats, and appropriate energy expenditure required for the weight goal.;Weight Management: Provide education and appropriate resources to help participant work on and attain dietary goals.;Weight Management/Obesity: Establish reasonable short term and long term weight goals.;Obesity: Provide education and appropriate resources to help participant work on and attain  dietary goals.   Admit Weight 176 lb 12.8 oz (80.2 kg)   Goal Weight: Short Term 171 lb (77.6 kg)   Goal Weight: Long Term 166 lb (75.3 kg)   Expected Outcomes Short Term: Continue to assess and modify interventions until short term weight is achieved;Long Term: Adherence to nutrition and physical activity/exercise program aimed toward attainment of established weight goal;Weight Loss: Understanding of general recommendations for a balanced deficit meal plan, which promotes 1-2 lb weight loss per week and includes a negative energy balance of [phone] kcal/d;Understanding recommendations for meals to include 15-35% energy as protein, 25-35% energy from fat, 35-60% energy from carbohydrates, less than 286m of dietary cholesterol, 20-35 gm of total fiber daily;Understanding of distribution of calorie intake throughout the day with the consumption of 4-5 meals/snacks   Diabetes Yes   Intervention Provide education about signs/symptoms and action to take for hypo/hyperglycemia.;Provide education about proper nutrition, including hydration, and aerobic/resistive exercise prescription along with prescribed medications to achieve blood glucose in normal ranges: Fasting glucose 65-99 mg/dL   Expected Outcomes Short Term: Participant verbalizes understanding of the signs/symptoms and immediate care of hyper/hypoglycemia, proper foot care and importance of medication, aerobic/resistive exercise and nutrition plan for blood glucose control.;Long Term: Attainment of HbA1C < 7%.   Hypertension Yes   Intervention Provide education on lifestyle modifcations including regular physical activity/exercise, weight management, moderate sodium restriction and increased consumption of fresh fruit, vegetables, and low fat dairy, alcohol moderation, and smoking cessation.;Monitor prescription use compliance.   Expected Outcomes Short Term: Continued assessment and intervention until BP is < 140/964mHG in hypertensive  participants. < 130/8056mG in hypertensive participants with diabetes, heart failure or chronic kidney disease.;Long Term: Maintenance of blood pressure at goal levels.   Lipids Yes   Intervention Provide education and support for participant on nutrition & aerobic/resistive exercise along with prescribed medications to achieve LDL <45m46mDL >40mg28mExpected Outcomes Short Term: Participant states understanding of desired cholesterol values and is compliant with medications prescribed. Participant is following exercise prescription and nutrition guidelines.;Long Term: Cholesterol controlled with medications as prescribed, with individualized exercise RX and with personalized nutrition plan. Value goals: LDL < 45mg,33m > 40 mg.       Personal Goals Discharge:     Goals and Risk Factor Review - 12/21/16 1223      Core Components/Risk Factors/Patient Goals Review   Personal Goals Review Weight Management/Obesity;Diabetes;Hypertension;Improve shortness of breath with ADL's   Review Paul sEddie Dibbless his weight was up 3 lb.  today but he is not short of breath.  He did eat out last night.  His BP has been good since med changes and BG have been between 112-115.  He has not been exercising at home but has been pressure washing outside against  his Dr advice.  I suggested he ask for help and follow Dr advice as being outside in addition to the task can be too hard on his heart valve.  He is having an ultrasound done soon to check the valve.   Expected Outcomes Short - Eddie Dibbles wil continue to exercise in class and will avoid outside work.  Long - Eddie Dibbles will maintain exercise and good control of BP and BG.      Exercise Goals and Review:     Exercise Goals    Row Name 10/02/16 1445             Exercise Goals   Increase Physical Activity Yes       Intervention Provide advice, education, support and counseling about physical activity/exercise needs.;Develop an individualized exercise prescription for  aerobic and resistive training based on initial evaluation findings, risk stratification, comorbidities and participant's personal goals.       Expected Outcomes Achievement of increased cardiorespiratory fitness and enhanced flexibility, muscular endurance and strength shown through measurements of functional capacity and personal statement of participant.       Increase Strength and Stamina Yes       Intervention Provide advice, education, support and counseling about physical activity/exercise needs.;Develop an individualized exercise prescription for aerobic and resistive training based on initial evaluation findings, risk stratification, comorbidities and participant's personal goals.       Expected Outcomes Achievement of increased cardiorespiratory fitness and enhanced flexibility, muscular endurance and strength shown through measurements of functional capacity and personal statement of participant.          Nutrition & Weight - Outcomes:     Pre Biometrics - 10/02/16 1445      Pre Biometrics   Height _0  (1.676 m)   Weight 209 lb 6.4 oz (95 kg)   Waist Circumference 44 inches   Hip Circumference 39 inches   Waist to Hip Ratio 1.13 %   BMI (Calculated) 33.9   Single Leg Stand 30 seconds         Post Biometrics - 12/26/16 0917       Post  Biometrics   Height _1  (1.676 m)   Weight 210 lb (95.3 kg)   Waist Circumference 44 inches   Hip Circumference 39 inches   Waist to Hip Ratio 1.13 %   BMI (Calculated) 33.91   Single Leg Stand 9.46 seconds      Nutrition:     Nutrition Therapy & Goals - 11/28/16 0915      Nutrition Therapy   RD appointment defered Yes  Continues to defer visit with RD      Nutrition Discharge:     Nutrition Assessments - 10/02/16 1337      MEDFICTS Scores   Pre Score 41      Education Questionnaire Score:     Knowledge Questionnaire Score - 01/02/17 0953      Knowledge Questionnaire Score   Pre Score 21/28   Post Score  23/28      Goals reviewed with patient; copy given to patient.

## 2017-01-03 ENCOUNTER — Encounter: Payer: Self-pay | Admitting: *Deleted

## 2017-01-03 DIAGNOSIS — Z952 Presence of prosthetic heart valve: Secondary | ICD-10-CM

## 2017-01-03 NOTE — Progress Notes (Signed)
Cardiac Individual Treatment Plan  Patient Details  Name: [name] [name] MRN: [id number] Date of Birth: [birth year] Referring Provider:     Cardiac Rehab from 10/02/2016 in [facility] Cardiac and Pulmonary Rehab  Referring Provider  [name] MD      Initial Encounter Date:    Cardiac Rehab from 10/02/2016 in [facility] Cardiac and Pulmonary Rehab  Date  10/02/16  Referring Provider  [name] MD      Visit Diagnosis: S/P TAVR (transcatheter aortic valve replacement)  Patient's Home Medications on Admission:  Current Outpatient Prescriptions:  .  acetaminophen (TYLENOL) 325 MG tablet, Take 2 tablets (650 mg total) by mouth every 4 (four) hours as needed for mild pain (temp > 101.5)., Disp: , Rfl:  .  Ascorbic Acid (VITAMIN C) 1000 MG tablet, Take 1,000 mg by mouth daily., Disp: , Rfl:  .  aspirin EC 81 MG EC tablet, Take 1 tablet (81 mg total) by mouth daily., Disp: , Rfl:  .  budesonide (PULMICORT) 0.5 MG/2ML nebulizer solution, Take 2 mLs (0.5 mg total) by nebulization 2 (two) times daily. (Patient not taking: Reported on 09/05/2016), Disp: , Rfl: 12 .  citalopram (CELEXA) 20 MG tablet, Take 1 tablet (20 mg total) by mouth daily., Disp: , Rfl:  .  clopidogrel (PLAVIX) 75 MG tablet, Take 75 mg by mouth daily., Disp: , Rfl:  .  famotidine (PEPCID) 20 MG tablet, Take 1 tablet (20 mg total) by mouth at bedtime. (Patient not taking: Reported on 09/05/2016), Disp: , Rfl:  .  fenofibrate 160 MG tablet, Take 160 mg by mouth daily., Disp: , Rfl:  .  fluticasone (FLONASE) 50 MCG/ACT nasal spray, Place 2 sprays into both nostrils daily., Disp: , Rfl: 2 .  furosemide (LASIX) 40 MG tablet, Take 1 tablet (40 mg total) by mouth 2 (two) times daily. (Patient not taking: Reported on 09/05/2016), Disp: 30 tablet, Rfl:  .  gabapentin (NEURONTIN) 100 MG capsule, Take 2 capsules (200 mg total) by mouth 2 (two) times daily., Disp: , Rfl:  .  heparin 5000 UNIT/ML injection, Inject 1 mL (5,000 Units  total) into the skin every 8 (eight) hours. (Patient not taking: Reported on 09/05/2016), Disp: 1 mL, Rfl:  .  hydrALAZINE (APRESOLINE) 50 MG tablet, Take 50 mg by mouth 2 (two) times daily., Disp: , Rfl:  .  insulin aspart (NOVOLOG) 100 UNIT/ML injection, Inject 0-9 Units into the skin every 4 (four) hours. (Patient not taking: Reported on 09/05/2016), Disp: 10 mL, Rfl: 11 .  ipratropium-albuterol (DUONEB) 0.5-2.5 (3) MG/3ML SOLN, Take 3 mLs by nebulization every 6 (six) hours., Disp: 360 mL, Rfl:  .  methocarbamol (ROBAXIN) 750 MG tablet, Take 750 mg by mouth 4 (four) times daily., Disp: , Rfl:  .  metoprolol succinate (TOPROL-XL) 50 MG 24 hr tablet, Take 50 mg by mouth daily. Take with or immediately following a meal., Disp: , Rfl:  .  pantoprazole (PROTONIX) 40 MG tablet, Take 40 mg by mouth daily., Disp: , Rfl:  .  Potassium 95 MG TABS, Take by mouth., Disp: , Rfl:  .  sodium chloride (OCEAN) 0.65 % SOLN nasal spray, Place 1 spray into both nostrils as needed for congestion., Disp: , Rfl: 0 .  tamsulosin (FLOMAX) 0.4 MG CAPS capsule, Take 1 capsule (0.4 mg total) by mouth daily., Disp: 30 capsule, Rfl:  .  torsemide (DEMADEX) 20 MG tablet, Take 20 mg by mouth daily., Disp: , Rfl:   Past Medical History: Past Medical History:  Diagnosis Date  . CHF (congestive heart failure) ([place])   . COPD (chronic obstructive pulmonary disease) ([place])   . Diabetes mellitus without complication ([place])   . Hypertension   . Kidney stones   . Renal disorder     Tobacco Use: History  Smoking Status  . Former Smoker  Smokeless Tobacco  . Former Systems developer  . Quit date: 05/16/2004    Labs: Recent Review Flowsheet Data    Labs for ITP Cardiac and Pulmonary Rehab Latest Ref Rng & Units 07/26/2012 07/05/2016 07/06/2016   Cholestrol 0 - 200 mg/dL 100 - -   LDLCALC 0 - 100 mg/dL 34 - -   HDL 40 - 60 mg/dL 35(L) - -   Trlycerides 0 - 200 mg/dL 157 - -   Hemoglobin A1c 4.2 - 6.3 % 6.2 - -   PHART 7.350 - 7.450 - 7.35  7.35   PCO2ART 32.0 - 48.0 mmHg - 49(H) 48   HCO3 20.0 - 28.0 mmol/L - 27.1 26.5   O2SAT % - 94.7 96.4       Exercise Target Goals:    Exercise Program Goal: Individual exercise prescription set with THRR, safety & activity barriers. Participant demonstrates ability to understand and report RPE using BORG scale, to self-measure pulse accurately, and to acknowledge the importance of the exercise prescription.  Exercise Prescription Goal: Starting with aerobic activity 30 plus minutes a day, 3 days per week for initial exercise prescription. Provide home exercise prescription and guidelines that participant acknowledges understanding prior to discharge.  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   Cardiac Risk Stratification High      6 Minute Walk:     6 Minute Walk    Row Name 10/02/16 1436 12/26/16 0918       6 Minute Walk   Phase Initial Discharge    Distance 1020 feet 1360 feet    Distance % Change  - 33 %    Distance Feet Change  - 340 ft    Walk Time 6 minutes 6 minutes    # of Rest Breaks 0 0    MPH 1.93 2.57    METS 2.24 2.59    RPE 9 15    Perceived Dyspnea  3  -    VO2 Peak 7.86 9.07    Symptoms Yes (comment) Yes (comment)    Comments SOB, knees felt weak legs feel weak    Resting HR 83 bpm 68 bpm    Resting BP 146/74 128/56    Resting Oxygen Saturation   - 96 %    Exercise Oxygen Saturation  during 6 min walk  - 88 %    Max Ex. HR 119 bpm 124 bpm    Max Ex. BP 146/74 192/68    2 Minute Post BP 128/64  -      Interval HR   Baseline HR (retired) 83  -    1 Minute HR 87 90    2 Minute HR 85 108    3 Minute HR 87 116    4 Minute HR 83 122    5 Minute HR 84 124    6 Minute HR 86 122    2  Minute Post HR 88  -  Interval Heart Rate? Yes  -      Interval Oxygen   Interval Oxygen? Yes Yes    Baseline Oxygen Saturation % 94 %  95% on Room Air 96 %    Resting Liters of Oxygen 2 L  -    1 Minute Oxygen Saturation % 87 % 93 %    1 Minute Liters of Oxygen 0 L  Room Air 3 L    2 Minute Oxygen Saturation % 85 % 90 %    2 Minute Liters of Oxygen 0 L 3 L    3 Minute Oxygen Saturation % 87 % 89 %    3 Minute Liters of Oxygen 0 L 3 L    4 Minute Oxygen Saturation % 83 % 88 %    4 Minute Liters of Oxygen 0 L 3 L    5 Minute Oxygen Saturation % 84 % 88 %    5 Minute Liters of Oxygen 0 L 3 L    6 Minute Oxygen Saturation % 86 % 88 %    6 Minute Liters of Oxygen 0 L 3 L    2 Minute Post Oxygen Saturation % 95 %  -    2 Minute Post Liters of Oxygen 0 L 3 L       Oxygen Initial Assessment:   Oxygen Re-Evaluation:     Oxygen Re-Evaluation    Row Name 11/28/16 2947 11/28/16 0940           Program Oxygen Prescription   Program Oxygen Prescription Continuous  -      Liters per minute 2  2-3 liters   -      Comments [name] [name] adjusts his oxygen according to his activity levels.  -        Home Oxygen   Home Oxygen Device  - Home Concentrator;Portable Concentrator      Sleep Oxygen Prescription  - Continuous;CPAP      Liters per minute  - 3      Home Exercise Oxygen Prescription  - Continuous      Liters per minute  - 2  uses 2-3 per activity      Home at Rest Exercise Oxygen Prescription  - Continuous      Liters per minute  - 2      Compliance with Home Oxygen Use  - Yes        Goals/Expected Outcomes   Short Term Goals  - To learn and understand importance of monitoring SPO2 with pulse oximeter and demonstrate accurate use of the pulse oximeter.;To Learn and understand importance of maintaining oxygen saturations>88%;To learn and demonstrate proper purse lipped breathing techniques or other breathing techniques.      Long  Term Goals  - Exhibits compliance with  exercise, home and travel O2 prescription;Verbalizes importance of monitoring SPO2 with pulse oximeter and return demonstration;Maintenance of O2 saturations>88%;Exhibits proper breathing techniques, such as purse lipped breathing or other method taught during program session      Comments  - [name] [name] is compliant with his use of oxygen at home and during activities in the program.  We reviewed the need to keep his saturation above 88%. He has a pulse oximeter at home and will bring it to a session to see demonstrate proper use of the oximeter.  He demonstrated great purse lipped nreathing technique.      Goals/Expected Outcomes  - [name] [name] will maintian oxygen stauration above 88% by monitoring his levels during activity and rest.  Long term goal [name] [name] will continue to be compliant with his oxygen use.          Oxygen Discharge (Final Oxygen Re-Evaluation):     Oxygen Re-Evaluation - 11/28/16 0940      Home Oxygen   Home Oxygen Device Home Concentrator;Portable Concentrator   Sleep Oxygen Prescription Continuous;CPAP   Liters per minute 3   Home Exercise Oxygen Prescription Continuous   Liters per minute 2  uses 2-3 per activity   Home at Rest Exercise Oxygen Prescription Continuous   Liters per minute 2   Compliance with Home Oxygen Use Yes     Goals/Expected Outcomes   Short Term Goals To learn and understand importance of monitoring SPO2 with pulse oximeter and demonstrate accurate use of the pulse oximeter.;To Learn and understand importance of maintaining oxygen saturations>88%;To learn and demonstrate proper purse lipped breathing techniques or other breathing techniques.   Long  Term Goals Exhibits compliance with exercise, home and travel O2 prescription;Verbalizes importance of monitoring SPO2 with pulse oximeter and return demonstration;Maintenance of O2 saturations>88%;Exhibits proper breathing techniques, such as purse lipped breathing or other method taught during program session    Comments [name] [name] is compliant with his use of oxygen at home and during activities in the program.  We reviewed the need to keep his saturation above 88%. He has a pulse oximeter at home and will bring it to a session to see demonstrate proper use of the oximeter.  He demonstrated great purse lipped nreathing technique.   Goals/Expected Outcomes [name] [name] will maintian oxygen stauration above 88% by monitoring his levels during activity and rest. Long term goal [name] [name] will continue to be compliant with his oxygen use.       Initial Exercise Prescription:     Initial Exercise Prescription - 10/02/16 1400      Date of Initial Exercise RX and Referring Provider   Date 10/02/16   Referring Provider [name] MD     Oxygen   Oxygen Continuous   Liters 2     Treadmill   MPH 1.6   Grade 0.5   Minutes 15   METs 2.34     Recumbant Bike   Level 1   RPM 25   Watts 7   Minutes 15   METs 2.2     T5 Nustep   Level 1   SPM 80   Minutes 15   METs 2     Prescription Details   Frequency (times per week) 2   Duration Progress to 45 minutes of aerobic exercise without signs/symptoms of physical distress     Intensity   THRR 40-80% of Max Heartrate 109-135   Ratings of Perceived Exertion 11-13   Perceived Dyspnea 0-4     Progression   Progression Continue to progress workloads to maintain intensity without signs/symptoms of physical distress.     Resistance Training   Training Prescription Yes   Weight 3 lbs   Reps 10-15      Perform Capillary Blood Glucose checks as needed.  Exercise Prescription Changes:     Exercise Prescription Changes    Row Name 10/02/16 1400 10/05/16 1000 10/24/16 1000 11/01/16 1400 11/15/16 1400     Response to Exercise   Blood Pressure (Admit) 146/74 128/56  - 132/76 148/72   Blood Pressure (Exercise) 146/74 138/70  - 160/80 146/74   Blood Pressure (Exit) 128/64 124/58  - 140/70 120/62   Heart Rate (Admit) 83 bpm 78 bpm  - 87 bpm 78  bpm   Heart Rate  (Exercise) 119 bpm 109 bpm  - 111 bpm 117 bpm   Heart Rate (Exit) 88 bpm 67 bpm  - 78 bpm 71 bpm   Oxygen Saturation (Admit) 94 %  -  -  -  -   Oxygen Saturation (Exercise) 83 % 85 %  -  -  -   Oxygen Saturation (Exit) 95 %  -  -  -  -   Rating of Perceived Exertion (Exercise) 9 13  - 15 13   Perceived Dyspnea (Exercise) 3  -  -  -  -   Symptoms SOB, knees felt weak none  - none none   Comments walk test results first full day of exercise  -  -  -   Duration  - Progress to 45 minutes of aerobic exercise without signs/symptoms of physical distress  - Progress to 45 minutes of aerobic exercise without signs/symptoms of physical distress Progress to 45 minutes of aerobic exercise without signs/symptoms of physical distress   Intensity  - THRR unchanged  - THRR unchanged THRR unchanged     Progression   Progression  - Continue to progress workloads to maintain intensity without signs/symptoms of physical distress.  - Continue to progress workloads to maintain intensity without signs/symptoms of physical distress. Continue to progress workloads to maintain intensity without signs/symptoms of physical distress.   Average METs  - 2.22  - 2.6 3.08     Resistance Training   Training Prescription  - Yes  - Yes Yes   Weight  - 3 lbs  - 4 lb 4 lb   Reps  - 10-15  - 10-15 10-15     Interval Training   Interval Training  - No  - No No     Oxygen   Oxygen  - Continuous  - Continuous Continuous   Liters  - 2  increased to 3 on treadmill  - 2 2     Treadmill   MPH  - 1.6  -  - 1.4   Grade  - 0.5  -  - 4   Minutes  - 15  -  - 15   METs  - 2.34  -  - 3.11     Recumbant Bike   Level  -  -  - 1 1   Watts  -  -  - 22 26   Minutes  -  -  - 15 15   METs  -  -  - 2.6 2.83     T5 Nustep   Level  - 1  -  - 1   Minutes  - 15  -  - 15   METs  - 2.1  -  - 3.3     Home Exercise Plan   Plans to continue exercise at  -  - Home (comment)  treadmill and bike  - Home (comment)  treadmill and bike    Frequency  -  - Add 2 additional days to program exercise sessions.  - Add 2 additional days to program exercise sessions.   Initial Home Exercises Provided  -  - 10/24/16  - 10/24/16   Row Name 11/30/16 1500 12/13/16 1500 12/26/16 1500         Response to Exercise   Blood Pressure (Admit) 136/64 136/70 138/58     Blood Pressure (Exercise) 126/56 150/64 192/68     Blood Pressure (Exit) 130/60 120/60 122/58     Heart  Rate (Admit) 83 bpm 80 bpm 90 bpm     Heart Rate (Exercise) 102 bpm 110 bpm 124 bpm     Heart Rate (Exit) 71 bpm 79 bpm 78 bpm     Rating of Perceived Exertion (Exercise) _0 Symptoms none SOB on treadmill none     Duration Progress to 45 minutes of aerobic exercise without signs/symptoms of physical distress Continue with 45 min of aerobic exercise without signs/symptoms of physical distress. Continue with 45 min of aerobic exercise without signs/symptoms of physical distress.     Intensity THRR unchanged THRR unchanged THRR unchanged       Progression   Progression Continue to progress workloads to maintain intensity without signs/symptoms of physical distress. Continue to progress workloads to maintain intensity without signs/symptoms of physical distress. Continue to progress workloads to maintain intensity without signs/symptoms of physical distress.     Average METs 2.75 3.01 3.01       Resistance Training   Training Prescription Yes Yes Yes     Weight 4 lb 4 lb 4 lb     Reps 10-15 10-15 10-15       Interval Training   Interval Training No No No       Oxygen   Oxygen Continuous Continuous Continuous     Liters _1 Treadmill   MPH 1.4 2.4 2.4     Grade 4 0.5 0.5     Minutes _2 METs 3._3 Recumbant Bike   Level _4 Watts _5 Minutes _6 METs 2.83 2.83 2.83       T5 Nustep   Level _7 Minutes _8 METs 2.3 3.1 3.2       Home Exercise Plan   Plans to continue exercise at Home  (comment)  treadmill and bike Home (comment)  treadmill and bike Home (comment)  treadmill and bike     Frequency Add 2 additional days to program exercise sessions. Add 2 additional days to program exercise sessions. Add 2 additional days to program exercise sessions.     Initial Home Exercises Provided 10/24/16 10/24/16 10/24/16        Exercise Comments:     Exercise Comments    Row Name 10/05/16 1037           Exercise Comments First full day of exercise!  Patient was oriented to gym and equipment including functions, settings, policies, and procedures.  Patient's individual exercise prescription and treatment plan were reviewed.  All starting workloads were established based on the results of the 6 minute walk test done at initial orientation visit.  The plan for exercise progression was also introduced and progression will be customized based on patient's performance and goals.          Exercise Goals and Review:     Exercise Goals    Row Name 10/02/16 1445             Exercise Goals   Increase Physical Activity Yes       Intervention Provide advice, education, support and counseling about physical activity/exercise needs.;Develop an individualized exercise prescription for aerobic and resistive training based on initial evaluation findings, risk stratification, comorbidities and participant's personal  goals.       Expected Outcomes Achievement of increased cardiorespiratory fitness and enhanced flexibility, muscular endurance and strength shown through measurements of functional capacity and personal statement of participant.       Increase Strength and Stamina Yes       Intervention Provide advice, education, support and counseling about physical activity/exercise needs.;Develop an individualized exercise prescription for aerobic and resistive training based on initial evaluation findings, risk stratification, comorbidities and participant's personal goals.       Expected  Outcomes Achievement of increased cardiorespiratory fitness and enhanced flexibility, muscular endurance and strength shown through measurements of functional capacity and personal statement of participant.          Exercise Goals Re-Evaluation :     Exercise Goals Re-Evaluation    Row Name 10/05/16 1037 10/17/16 1328 10/24/16 1011 11/01/16 1455 11/15/16 1358     Exercise Goal Re-Evaluation   Exercise Goals Review Increase Physical Activity  - Increase Physical Activity;Increase Strenth and Stamina Increase Physical Activity;Increase Strenth and Stamina Increase Physical Activity;Increase Strenth and Stamina   Comments [name] [name] has completed his first full day of exercise!  We will continue to monitor his progression Out since last review due to knee pain.  He was cleared to return by orthopedics today. Reviewed home exercise with pt today.  Pt plans to walk and bike for exercise.  Reviewed THR, pulse, RPE, sign and symptoms, NTG use, and when to call 911 or MD.  Also discussed weather considerations and indoor options.  Pt voiced understanding. [name] [name] has increased his Pascal Lux on the bike and weight for strength work. [name] [name] has continued to do well in rehab.  He is now up to 4% grade on the treadmill but he cannot go faster.  We will continue to monitor his progression.   Expected Outcomes Continue to come to classes to work on increasing physical activity.  - Short - start home exercise Long - continue to exercise independently [place] will continue to increase his worklaod and improve overall functional capacity.  [place] wil graduate and continue to exercise independently. Short: Work on increasing workload on stepper.  Long: Continue to work on independent exercise.    [place] Name 11/30/16 1521 12/13/16 1511 12/26/16 1520         Exercise Goal Re-Evaluation   Exercise Goals Review Increase Physical Activity;Increase Strenth and Stamina Increase Physical Activity;Increase Strenth and Stamina  Increase Physical Activity;Increase Strength and Stamina     Comments [name] [name] continues to do well in rehab.  He is up to 27 watts on the recumbent bike.  We will continue to monitor his progress. [name] [name] has been doing well in rehab.  He continues to make improvements.  He still has the hardest time on the treadmill but he gets his full 15 min on there.  We will continue to monitor his progression.  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 [name] [name] improved his post 6MWT by 340 ft!!  He was able to keep his oxygen saturations around 88% during his walk test. His biggest improvement was that he was able to walk at 2.57 mph versus 1.93 mph.  He is going to continue to exercise by walking at home.   We will continue to monitor his progress.      Expected Outcomes Short: Increase workload on stepper.  Long: Continue to work towards more independent exercise.  Short: Improve his 6MWT distance.  Long: Continue to exercise independently.Marland Kitchen  Short: Continue to exercise regularly.  Long: Continue to exercise independently even after graduation.         Discharge Exercise Prescription (Final Exercise Prescription Changes):     Exercise Prescription Changes - 12/26/16 1500      Response to Exercise   Blood Pressure (Admit) 138/58   Blood Pressure (Exercise) 192/68   Blood Pressure (Exit) 122/58   Heart Rate (Admit) 90 bpm   Heart Rate (Exercise) 124 bpm   Heart Rate (Exit) 78 bpm   Rating of Perceived Exertion (Exercise) 15   Symptoms none   Duration Continue with 45 min of aerobic exercise without signs/symptoms of physical distress.   Intensity THRR unchanged     Progression   Progression Continue to progress workloads to maintain intensity without signs/symptoms of physical distress.   Average METs 3.01     Resistance Training   Training Prescription Yes   Weight 4 lb   Reps 10-15     Interval Training   Interval Training No     Oxygen   Oxygen Continuous   Liters 2      Treadmill   MPH 2.4   Grade 0.5   Minutes 15   METs 3     Recumbant Bike   Level 1   Watts 25   Minutes 15   METs 2.83     T5 Nustep   Level 3   Minutes 15   METs 3.2     Home Exercise Plan   Plans to continue exercise at Home (comment)  treadmill and bike   Frequency Add 2 additional days to program exercise sessions.   Initial Home Exercises Provided 10/24/16      Nutrition:  Target Goals: Understanding of nutrition guidelines, daily intake of sodium <1561m, cholesterol <2026m calories 30% from fat and 7% or less from saturated fats, daily to have 5 or more servings of fruits and vegetables.  Biometrics:     Pre Biometrics - 10/02/16 1445      Pre Biometrics   Height 5' 6" (1.676 m)   Weight 209 lb 6.4 oz (95 kg)   Waist Circumference 44 inches   Hip Circumference 39 inches   Waist to Hip Ratio 1.13 %   BMI (Calculated) 33.9   Single Leg Stand 30 seconds         Post Biometrics - 12/26/16 0917       Post  Biometrics   Height 5' 6" (1.676 m)   Weight 210 lb (95.3 kg)   Waist Circumference 44 inches   Hip Circumference 39 inches   Waist to Hip Ratio 1.13 %   BMI (Calculated) 33.91   Single Leg Stand 9.46 seconds      Nutrition Therapy Plan and Nutrition Goals:     Nutrition Therapy & Goals - 11/28/16 0915      Nutrition Therapy   RD appointment defered Yes  Continues to defer visit with RD      Nutrition Discharge: Rate Your Plate Scores:     Nutrition Assessments - 10/02/16 1337      MEDFICTS Scores   Pre Score 41      Nutrition Goals Re-Evaluation:     Nutrition Goals Re-Evaluation    [place] 10/19/16 0851 10/26/16 0914           Goals   Current Weight  - 217 lb 3.2 oz (98.5 kg)      Nutrition Goal  - -      Comment  declined nutrition appointment [name] [name] wants to continue to lose weight and monitors his weight daily.  He is reading food labels and watching sodium intake.  He has lost around 30 lb.        Expected Outcome  maintain heart healthy diet [name] [name] will continue to eat heart healthy diet and see continued weight management.         Nutrition Goals Discharge (Final Nutrition Goals Re-Evaluation):     Nutrition Goals Re-Evaluation - 10/26/16 0914      Goals   Current Weight 217 lb 3.2 oz (98.5 kg)   Nutrition Goal --   Comment [name] [name] wants to continue to lose weight and monitors his weight daily.  He is reading food labels and watching sodium intake.  He has lost around 30 lb.     Expected Outcome [name] [name] will continue to eat heart healthy diet and see continued weight management.      Psychosocial: Target Goals: Acknowledge presence or absence of significant depression and/or stress, maximize coping skills, provide positive support system. Participant is able to verbalize types and ability to use techniques and skills needed for reducing stress and depression.   Initial Review & Psychosocial Screening:     Initial Psych Review & Screening - 10/02/16 1359      Screening Interventions   Interventions Encouraged to exercise      Quality of Life Scores:      Quality of Life - 01/02/17 0955      Quality of Life Scores   Health/Function Pre 26.54 %   Health/Function Post 26.47 %   Health/Function % Change -0.26 %   Socioeconomic Pre 26.64 %   Socioeconomic Post 26 %   Socioeconomic % Change  -2.4 %   Psych/Spiritual Pre 27.5 %   Psych/Spiritual Post 27.07 %   Psych/Spiritual % Change -1.56 %   Family Pre 28.13 %   Family Post 28.5 %   Family % Change 1.32 %   GLOBAL Pre 26.97 %   GLOBAL Post 26.77 %   GLOBAL % Change -0.74 %      PHQ-9: Recent Review Flowsheet Data    Depression screen [facility] 2/9 01/02/2017 10/02/2016   Decreased Interest 0 0   Down, Depressed, Hopeless 0 0   PHQ - 2 Score 0 0   Altered sleeping 2 0   Tired, decreased energy 0 0   Change in appetite 0 0   Feeling bad or failure about yourself  0 0   Trouble concentrating 0 0   Moving slowly or fidgety/restless 0 0    Suicidal thoughts 0 0   PHQ-9 Score 2 0     Interpretation of Total Score  Total Score Depression Severity:  1-4 = Minimal depression, 5-9 = Mild depression, 10-14 = Moderate depression, 15-19 = Moderately severe depression, 20-27 = Severe depression   Psychosocial Evaluation and Intervention:     Psychosocial Evaluation - 10/26/16 0858      Psychosocial Evaluation & Interventions   Interventions Relaxation education;Encouraged to exercise with the program and follow exercise prescription;Stress management education   Comments Counselor met with Mr. Fetterman today [name] [name]) for initial psychosocial evaluation.  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  He also struggles with CHF and Diabetes.  [name] [name] has a strong support system with a spouse of 33 years; a son and daughter who live close by and he is actively involved in his local church.   [name] [name] has some  difficulty sleeping at times with approximately 4-5 hours per night - partically due to the fluid medications he takes.  [name] [name] denies a history of depression or anxiety; but therapist noticed he is on Celexa at this time.  He reports his mood is generally stable most of the time.  He has multiple stressors with his own health and that of his spouse.  He has goals to lose weight while in this program and to increase  his stamina and strength.  He has already noticed some progress in these areas since the surgery.  [name] [name] plans to work out at home on his bike or treadmill.  Staff will continue to follow with [name] [name] throughout the course of this program.     Expected Outcomes [name] [name] will benefit from consistent exercise to achieve his stated goals.  He will also need to meet with the dietician to address his weight loss goals.  [name] [name] will be participating in the psychoeducational components of this program to learn better coping strategies to deal with his mood and stress concerns.     Continue Psychosocial Services  Follow up  required by staff      Psychosocial Re-Evaluation:     Psychosocial Re-Evaluation    [place] Name 11/28/16 [phone]             Psychosocial Re-Evaluation   Current issues with Current Sleep Concerns       Comments [name] [name] is doing well, is having some stress in past month over BP fluctuations.  Is seeing MD team to work on BP readings.  Med changes are helping, though not back to best range. Sees Kidney doctor next week.  Feels better since has gained better control with BP.        Expected Outcomes PAul will be able to manage stress of BP readngs and working with his medical team to gain control.   Long term, BP under control and stress reduced.        Interventions Encouraged to attend Cardiac Rehabilitation for the exercise;Relaxation education;Stress management education       Continue Psychosocial Services  Follow up required by staff          Psychosocial Discharge (Final Psychosocial Re-Evaluation):     Psychosocial Re-Evaluation - 11/28/16 0917      Psychosocial Re-Evaluation   Current issues with Current Sleep Concerns   Comments [name] [name] is doing well, is having some stress in past month over BP fluctuations.  Is seeing MD team to work on BP readings.  Med changes are helping, though not back to best range. Sees Kidney doctor next week.  Feels better since has gained better control with BP.    Expected Outcomes PAul will be able to manage stress of BP readngs and working with his medical team to gain control.   Long term, BP under control and stress reduced.    Interventions Encouraged to attend Cardiac Rehabilitation for the exercise;Relaxation education;Stress management education   Continue Psychosocial Services  Follow up required by staff      Vocational Rehabilitation: Provide vocational rehab assistance to qualifying candidates.   Vocational Rehab Evaluation & Intervention:     Vocational Rehab - 10/02/16 1351      Initial Vocational Rehab Evaluation & Intervention    Assessment shows need for Vocational Rehabilitation No      Education: Education Goals: Education classes will be provided on a variety of topics geared toward better understanding of heart health and risk factor modification. Participant will state  understanding/return demonstration of topics presented as noted by education test scores.  Learning Barriers/Preferences:     Learning Barriers/Preferences - 10/02/16 1349      Learning Barriers/Preferences   Learning Barriers Hearing   Learning Preferences Individual Instruction;Verbal Instruction      Education Topics: General Nutrition Guidelines/Fats and Fiber: -Group instruction provided by verbal, written material, models and posters to present the general guidelines for heart healthy nutrition. Gives an explanation and review of dietary fats and fiber.   Cardiac Rehab from 01/02/2017 in [facility] Cardiac and Pulmonary Rehab  Date  12/12/16  Educator  CR      Controlling Sodium/Reading Food Labels: -Group verbal and written material supporting the discussion of sodium use in heart healthy nutrition. Review and explanation with models, verbal and written materials for utilization of the food label.   Cardiac Rehab from 01/02/2017 in [facility] Cardiac and Pulmonary Rehab  Date  10/24/16  Educator  PI      Exercise Physiology & Risk Factors: - Group verbal and written instruction with models to review the exercise physiology of the cardiovascular system and associated critical values. Details cardiovascular disease risk factors and the goals associated with each risk factor.   Cardiac Rehab from 01/02/2017 in [facility] Cardiac and Pulmonary Rehab  Date  12/28/16  Educator  [facility]  Instruction Review Code  1- Verbalizes Understanding      Aerobic Exercise & Resistance Training: - Gives group verbal and written discussion on the health impact of inactivity. On the components of aerobic and resistive training programs and the benefits of this  training and how to safely progress through these programs.   Cardiac Rehab from 01/02/2017 in [facility] Cardiac and Pulmonary Rehab  Date  01/02/17  Educator  [facility]  Instruction Review Code  1- Verbalizes Understanding      Flexibility, Balance, General Exercise Guidelines: - Provides group verbal and written instruction on the benefits of flexibility and balance training programs. Provides general exercise guidelines with specific guidelines to those with heart or lung disease. Demonstration and skill practice provided.   Cardiac Rehab from 01/02/2017 in [facility] Cardiac and Pulmonary Rehab  Date  11/07/16  Educator  AS      Stress Management: - Provides group verbal and written instruction about the health risks of elevated stress, cause of high stress, and healthy ways to reduce stress.   Depression: - Provides group verbal and written instruction on the correlation between heart/lung disease and depressed mood, treatment options, and the stigmas associated with seeking treatment.   Cardiac Rehab from 01/02/2017 in [facility] Cardiac and Pulmonary Rehab  Date  12/07/16  Educator  [facility]  Instruction Review Code (retired)  2- meets Designer, fashion/clothing & Physiology of the Heart: - Group verbal and written instruction and models provide basic cardiac anatomy and physiology, with the coronary electrical and arterial systems. Review of: AMI, Angina, Valve disease, Heart Failure, Cardiac Arrhythmia, Pacemakers, and the ICD.   Cardiac Procedures: - Group verbal and written instruction to review commonly prescribed medications for heart disease. Reviews the medication, class of the drug, and side effects. Includes the steps to properly store meds and maintain the prescription regimen. (beta blockers and nitrates)   Cardiac Rehab from 01/02/2017 in [facility] Cardiac and Pulmonary Rehab  Date  11/21/16  Educator  CE      Cardiac Medications I: - Group verbal and written instruction to review commonly  prescribed medications for heart disease. Reviews the medication, class of  the drug, and side effects. Includes the steps to properly store meds and maintain the prescription regimen.   Cardiac Rehab from 01/02/2017 in [facility] Cardiac and Pulmonary Rehab  Date  11/28/16 [6/14 Part Two]  Educator  SB [8/7 Part One 8/9 Part Two]      Cardiac Medications II: -Group verbal and written instruction to review commonly prescribed medications for heart disease. Reviews the medication, class of the drug, and side effects. (all other drug classes)    Go Sex-Intimacy & Heart Disease, Get SMART - Goal Setting: - Group verbal and written instruction through game format to discuss heart disease and the return to sexual intimacy. Provides group verbal and written material to discuss and apply goal setting through the application of the S.M.A.R.T. Method.   Cardiac Rehab from 01/02/2017 in [facility] Cardiac and Pulmonary Rehab  Date  11/21/16  Educator  CE      Other Matters of the Heart: - Provides group verbal, written materials and models to describe Heart Failure, Angina, Valve Disease, Peripheral Artery Disease, and Diabetes in the realm of heart disease. Includes description of the disease process and treatment options available to the cardiac patient.   Exercise & Equipment Safety: - Individual verbal instruction and demonstration of equipment use and safety with use of the equipment.   Cardiac Rehab from 01/02/2017 in [facility] Cardiac and Pulmonary Rehab  Date  10/02/16  Educator  C. EnterkinRN      Infection Prevention: - Provides verbal and written material to individual with discussion of infection control including proper hand washing and proper equipment cleaning during exercise session.   Cardiac Rehab from 01/02/2017 in [facility] Cardiac and Pulmonary Rehab  Date  10/02/16  Educator  C. Enterkin, RN      Falls Prevention: - Provides verbal and written material to individual with discussion of falls  prevention and safety.   Cardiac Rehab from 01/02/2017 in [facility] Cardiac and Pulmonary Rehab  Date  10/02/16  Educator  C. Enterkin,RN  Instruction Review Code (retired)  2- meets goals/outcomes      Diabetes: - Individual verbal and written instruction to review signs/symptoms of diabetes, desired ranges of glucose level fasting, after meals and with exercise. Acknowledge that pre and post exercise glucose checks will be done for 3 sessions at entry of program.   Cardiac Rehab from 01/02/2017 in [facility] Cardiac and Pulmonary Rehab  Date  10/02/16  Educator  C. Enterkin,RN      Other: -Provides group and verbal instruction on various topics (see comments)    Knowledge Questionnaire Score:     Knowledge Questionnaire Score - 01/02/17 0953      Knowledge Questionnaire Score   Pre Score 21/28   Post Score 23/28      Core Components/Risk Factors/Patient Goals at Admission:     Personal Goals and Risk Factors at Admission - 10/02/16 1358      Core Components/Risk Factors/Patient Goals on Admission    Weight Management Yes;Obesity;Weight Loss   Intervention Weight Management: Develop a combined nutrition and exercise program designed to reach desired caloric intake, while maintaining appropriate intake of nutrient and fiber, sodium and fats, and appropriate energy expenditure required for the weight goal.;Weight Management: Provide education and appropriate resources to help participant work on and attain dietary goals.;Weight Management/Obesity: Establish reasonable short term and long term weight goals.;Obesity: Provide education and appropriate resources to help participant work on and attain dietary goals.   Admit Weight 176 lb 12.8 oz (80.2 kg)  Goal Weight: Short Term 171 lb (77.6 kg)   Goal Weight: Long Term 166 lb (75.3 kg)   Expected Outcomes Short Term: Continue to assess and modify interventions until short term weight is achieved;Long Term: Adherence to nutrition and  physical activity/exercise program aimed toward attainment of established weight goal;Weight Loss: Understanding of general recommendations for a balanced deficit meal plan, which promotes 1-2 lb weight loss per week and includes a negative energy balance of [phone] kcal/d;Understanding recommendations for meals to include 15-35% energy as protein, 25-35% energy from fat, 35-60% energy from carbohydrates, less than 268m of dietary cholesterol, 20-35 gm of total fiber daily;Understanding of distribution of calorie intake throughout the day with the consumption of 4-5 meals/snacks   Diabetes Yes   Intervention Provide education about signs/symptoms and action to take for hypo/hyperglycemia.;Provide education about proper nutrition, including hydration, and aerobic/resistive exercise prescription along with prescribed medications to achieve blood glucose in normal ranges: Fasting glucose 65-99 mg/dL   Expected Outcomes Short Term: Participant verbalizes understanding of the signs/symptoms and immediate care of hyper/hypoglycemia, proper foot care and importance of medication, aerobic/resistive exercise and nutrition plan for blood glucose control.;Long Term: Attainment of HbA1C < 7%.   Hypertension Yes   Intervention Provide education on lifestyle modifcations including regular physical activity/exercise, weight management, moderate sodium restriction and increased consumption of fresh fruit, vegetables, and low fat dairy, alcohol moderation, and smoking cessation.;Monitor prescription use compliance.   Expected Outcomes Short Term: Continued assessment and intervention until BP is < 140/919mHG in hypertensive participants. < 130/802mG in hypertensive participants with diabetes, heart failure or chronic kidney disease.;Long Term: Maintenance of blood pressure at goal levels.   Lipids Yes   Intervention Provide education and support for participant on nutrition & aerobic/resistive exercise along with  prescribed medications to achieve LDL <96m80mDL >40mg52mExpected Outcomes Short Term: Participant states understanding of desired cholesterol values and is compliant with medications prescribed. Participant is following exercise prescription and nutrition guidelines.;Long Term: Cholesterol controlled with medications as prescribed, with individualized exercise RX and with personalized nutrition plan. Value goals: LDL < 96mg,93m > 40 mg.      Core Components/Risk Factors/Patient Goals Review:      Goals and Risk Factor Review    Row Name 10/26/16 0918 02025/18 0933 12/21/16 1223         Core Components/Risk Factors/Patient Goals Review   Personal Goals Review Weight Management/Obesity;Heart Failure;Diabetes Weight Management/Obesity;Diabetes;Lipids;Hypertension;Stress Weight Management/Obesity;Diabetes;Hypertension;Improve shortness of breath with ADL's     Review Paul wEddie [name] to continue to lose weight and monitors his weight daily.  He is reading food labels and watching sodium intake.  He has lost around 30 lb.  His BG at home is between 110-150.  All readings taken here have been within acceptable ranges. Paul sEddie Dibbless his BP has been up and down and that he is seeing his physicians to work on getting better control. He is noticing improvement with his BPreadings, though not in normal range yet.  His last A1C was 5.7 %. His weight is at 210 lbs, lower than entry weight. He states that he feels like he is making progress and that he continues to see his group of physicians to keep his risk factors in control.  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 Paul cEddie Dibblesnues to read food labels and watch his soduim intake,. He remains compliant with his medications Paul sEddie Dibbless his weight was up 3 lb.  today but he is not short  of breath.  He did eat out last night.  His BP has been good since med changes and BG have been between 112-115.  He has not been exercising at home but has been pressure  washing outside against his Dr advice.  I suggested he ask for help and follow Dr advice as being outside in addition to the task can be too hard on his heart valve.  He is having an ultrasound done soon to check the valve.     Expected Outcomes [name] [name] will continue to make healthy lifestyle choices and see continued improvement in his health status.  [name] [name] will continue to make healthy lifestyle choices and see continued improvement in his health status. Short term goal: [name] [name] will continue to work with his medical team to gain better control of his blood pressure.  Short - [name] [name] wil continue to exercise in class and will avoid outside work.  Long - [name] [name] will maintain exercise and good control of BP and BG.        Core Components/Risk Factors/Patient Goals at Discharge (Final Review):      Goals and Risk Factor Review - 12/21/16 1223      Core Components/Risk Factors/Patient Goals Review   Personal Goals Review Weight Management/Obesity;Diabetes;Hypertension;Improve shortness of breath with ADL's   Review [name] [name] states his weight was up 3 lb.  today but he is not short of breath.  He did eat out last night.  His BP has been good since med changes and BG have been between 112-115.  He has not been exercising at home but has been pressure washing outside against his Dr advice.  I suggested he ask for help and follow Dr advice as being outside in addition to the task can be too hard on his heart valve.  He is having an ultrasound done soon to check the valve.   Expected Outcomes Short - [name] [name] wil continue to exercise in class and will avoid outside work.  Long - [name] [name] will maintain exercise and good control of BP and BG.      ITP Comments:     ITP Comments    Row Name 10/02/16 1357 10/11/16 0551 10/17/16 1329 11/08/16 0640 11/14/16 0924   ITP Comments ITP Created after Cardiac Rehab informed consent signed during the Medical Review.  30 day review. Continue with ITP unless directed changes per Medical  Director review.  New start to program Out since last review due to knee pain.  He was cleared to return by orthopedics today. 30 day review. Continue with ITP unless directed changes per Medical Director review    [name] [name] called to let us know that he was going to miss class this morning.  He said that he was having issues with his blood pressure running high.   [place] Name 12/06/16 [phone] 01/03/17 1107         ITP Comments 30 day review. Continue with ITP unless directed changes per Medical Director review  30 day review. Continue with ITP unless directed changes per Medical Director review.           Comments:

## 2017-01-09 ENCOUNTER — Encounter: Payer: PPO | Admitting: *Deleted

## 2017-01-09 DIAGNOSIS — Z954 Presence of other heart-valve replacement: Secondary | ICD-10-CM | POA: Diagnosis not present

## 2017-01-09 DIAGNOSIS — Z952 Presence of prosthetic heart valve: Secondary | ICD-10-CM

## 2017-01-09 NOTE — Progress Notes (Signed)
Discharge Progress Report  Patient Details  Name: [name] [name] MRN: [id number] Date of Birth: 01/03/1946 Referring Provider:     Cardiac Rehab from 10/02/2016 in [facility]  Referring Provider  [name] MD       Number of Visits: 36/36  Reason for Discharge:  Patient reached a stable level of exercise. Patient independent in their exercise. Patient has met program and personal goals.  Smoking History:  History  Smoking Status  . Former Smoker  Smokeless Tobacco  . Former Systems developer  . Quit date: 05/16/2004    Diagnosis:  S/P TAVR (transcatheter aortic valve replacement)  ADL [facility]SD:   Initial Exercise Prescription:     Initial Exercise Prescription - 10/02/16 1400      Date of Initial Exercise RX and Referring Provider   Date 10/02/16   Referring Provider [name] MD     Oxygen   Oxygen Continuous   Liters 2     Treadmill   MPH 1.6   Grade 0.5   Minutes 15   METs 2.34     Recumbant Bike   Level 1   RPM 25   Watts 7   Minutes 15   METs 2.2     T5 Nustep   Level 1   SPM 80   Minutes 15   METs 2     Prescription Details   Frequency (times per week) 2   Duration Progress to 45 minutes of aerobic exercise without signs/symptoms of physical distress     Intensity   THRR 40-80% of Max Heartrate 109-135   Ratings of Perceived Exertion 11-13   Perceived Dyspnea 0-4     Progression   Progression Continue to progress workloads to maintain intensity without signs/symptoms of physical distress.     Resistance Training   Training Prescription Yes   Weight 3 lbs   Reps 10-15      Discharge Exercise Prescription (Final Exercise Prescription Changes):     Exercise Prescription Changes - 12/26/16 1500      Response to Exercise   Blood Pressure (Admit) 138/58   Blood Pressure (Exercise) 192/68   Blood Pressure (Exit) 122/58   Heart Rate (Admit) 90 bpm   Heart Rate (Exercise) 124 bpm   Heart Rate (Exit) 78  bpm   Rating of Perceived Exertion (Exercise) 15   Symptoms none   Duration Continue with 45 min of aerobic exercise without signs/symptoms of physical distress.   Intensity THRR unchanged     Progression   Progression Continue to progress workloads to maintain intensity without signs/symptoms of physical distress.   Average METs 3.01     Resistance Training   Training Prescription Yes   Weight 4 lb   Reps 10-15     Interval Training   Interval Training No     Oxygen   Oxygen Continuous   Liters 2     Treadmill   MPH 2.4   Grade 0.5   Minutes 15   METs 3     Recumbant Bike   Level 1   Watts 25   Minutes 15   METs 2.83     T5 Nustep   Level 3   Minutes 15   METs 3.2     Home Exercise Plan   Plans to continue exercise at Home (comment)  treadmill and bike   Frequency Add 2 additional days to program exercise sessions.   Initial Home Exercises Provided 10/24/16  Functional Capacity:     6 Minute Walk    Row Name 10/02/16 1436 12/26/16 0918       6 Minute Walk   Phase Initial Discharge    Distance 1020 feet 1360 feet    Distance % Change  - 33 %    Distance Feet Change  - 340 ft    Walk Time 6 minutes 6 minutes    # of Rest Breaks 0 0    MPH 1.93 2.57    METS 2.24 2.59    RPE 9 15    Perceived Dyspnea  3  -    VO2 Peak 7.86 9.07    Symptoms Yes (comment) Yes (comment)    Comments SOB, knees felt weak legs feel weak    Resting HR 83 bpm 68 bpm    Resting BP 146/74 128/56    Resting Oxygen Saturation   - 96 %    Exercise Oxygen Saturation  during 6 min walk  - 88 %    Max Ex. HR 119 bpm 124 bpm    Max Ex. BP 146/74 192/68    2 Minute Post BP 128/64  -      Interval HR   Baseline HR (retired) 83  -    1 Minute HR 87 90    2 Minute HR 85 108    3 Minute HR 87 116    4 Minute HR 83 122    5 Minute HR 84 124    6 Minute HR 86 122    2 Minute Post HR 88  -    Interval Heart Rate? Yes  -      Interval Oxygen   Interval Oxygen? Yes Yes     Baseline Oxygen Saturation % 94 %  95% on Room Air 96 %    Resting Liters of Oxygen 2 L  -    1 Minute Oxygen Saturation % 87 % 93 %    1 Minute Liters of Oxygen 0 L  Room Air 3 L    2 Minute Oxygen Saturation % 85 % 90 %    2 Minute Liters of Oxygen 0 L 3 L    3 Minute Oxygen Saturation % 87 % 89 %    3 Minute Liters of Oxygen 0 L 3 L    4 Minute Oxygen Saturation % 83 % 88 %    4 Minute Liters of Oxygen 0 L 3 L    5 Minute Oxygen Saturation % 84 % 88 %    5 Minute Liters of Oxygen 0 L 3 L    6 Minute Oxygen Saturation % 86 % 88 %    6 Minute Liters of Oxygen 0 L 3 L    2 Minute Post Oxygen Saturation % 95 %  -    2 Minute Post Liters of Oxygen 0 L 3 L       Psychological, QOL, Others - Outcomes: PHQ 2/9: Depression screen [facility] 2/9 01/02/2017 10/02/2016  Decreased Interest 0 0  Down, Depressed, Hopeless 0 0  PHQ - 2 Score 0 0  Altered sleeping 2 0  Tired, decreased energy 0 0  Change in appetite 0 0  Feeling bad or failure about yourself  0 0  Trouble concentrating 0 0  Moving slowly or fidgety/restless 0 0  Suicidal thoughts 0 0  PHQ-9 Score 2 0    Quality of Life:     Quality of Life - 01/02/17 5449  Quality of Life Scores   Health/Function Pre 26.54 %   Health/Function Post 26.47 %   Health/Function % Change -0.26 %   Socioeconomic Pre 26.64 %   Socioeconomic Post 26 %   Socioeconomic % Change  -2.4 %   Psych/Spiritual Pre 27.5 %   Psych/Spiritual Post 27.07 %   Psych/Spiritual % Change -1.56 %   Family Pre 28.13 %   Family Post 28.5 %   Family % Change 1.32 %   GLOBAL Pre 26.97 %   GLOBAL Post 26.77 %   GLOBAL % Change -0.74 %      Personal Goals: Goals established at orientation with interventions provided to work toward goal.     Personal Goals and Risk Factors at Admission - 10/02/16 1358      Core Components/Risk Factors/Patient Goals on Admission    Weight Management Yes;Obesity;Weight Loss   Intervention Weight Management: Develop a  combined nutrition and exercise program designed to reach desired caloric intake, while maintaining appropriate intake of nutrient and fiber, sodium and fats, and appropriate energy expenditure required for the weight goal.;Weight Management: Provide education and appropriate resources to help participant work on and attain dietary goals.;Weight Management/Obesity: Establish reasonable short term and long term weight goals.;Obesity: Provide education and appropriate resources to help participant work on and attain dietary goals.   Admit Weight 176 lb 12.8 oz (80.2 kg)   Goal Weight: Short Term 171 lb (77.6 kg)   Goal Weight: Long Term 166 lb (75.3 kg)   Expected Outcomes Short Term: Continue to assess and modify interventions until short term weight is achieved;Long Term: Adherence to nutrition and physical activity/exercise program aimed toward attainment of established weight goal;Weight Loss: Understanding of general recommendations for a balanced deficit meal plan, which promotes 1-2 lb weight loss per week and includes a negative energy balance of [phone] kcal/d;Understanding recommendations for meals to include 15-35% energy as protein, 25-35% energy from fat, 35-60% energy from carbohydrates, less than 238m of dietary cholesterol, 20-35 gm of total fiber daily;Understanding of distribution of calorie intake throughout the day with the consumption of 4-5 meals/snacks   Diabetes Yes   Intervention Provide education about signs/symptoms and action to take for hypo/hyperglycemia.;Provide education about proper nutrition, including hydration, and aerobic/resistive exercise prescription along with prescribed medications to achieve blood glucose in normal ranges: Fasting glucose 65-99 mg/dL   Expected Outcomes Short Term: Participant verbalizes understanding of the signs/symptoms and immediate care of hyper/hypoglycemia, proper foot care and importance of medication, aerobic/resistive exercise and nutrition  plan for blood glucose control.;Long Term: Attainment of HbA1C < 7%.   Hypertension Yes   Intervention Provide education on lifestyle modifcations including regular physical activity/exercise, weight management, moderate sodium restriction and increased consumption of fresh fruit, vegetables, and low fat dairy, alcohol moderation, and smoking cessation.;Monitor prescription use compliance.   Expected Outcomes Short Term: Continued assessment and intervention until BP is < 140/956mHG in hypertensive participants. < 130/8061mG in hypertensive participants with diabetes, heart failure or chronic kidney disease.;Long Term: Maintenance of blood pressure at goal levels.   Lipids Yes   Intervention Provide education and support for participant on nutrition & aerobic/resistive exercise along with prescribed medications to achieve LDL <46m45mDL >40mg16mExpected Outcomes Short Term: Participant states understanding of desired cholesterol values and is compliant with medications prescribed. Participant is following exercise prescription and nutrition guidelines.;Long Term: Cholesterol controlled with medications as prescribed, with individualized exercise RX and with personalized nutrition plan. Value goals: LDL <  68m, HDL > 40 mg.       Personal Goals Discharge:     Goals and Risk Factor Review    Row Name 10/26/16 0627008/07/18 0933 12/21/16 1223         Core Components/Risk Factors/Patient Goals Review   Personal Goals Review Weight Management/Obesity;Heart Failure;Diabetes Weight Management/Obesity;Diabetes;Lipids;Hypertension;Stress Weight Management/Obesity;Diabetes;Hypertension;Improve shortness of breath with ADL's     Review PEddie Dibbleswants to continue to lose weight and monitors his weight daily.  He is reading food labels and watching sodium intake.  He has lost around 30 lb.  His BG at home is between 110-150.  All readings taken here have been within acceptable ranges. PEddie Dibblesstates his BP has  been up and down and that he is seeing his physicians to work on getting better control. He is noticing improvement with his BPreadings, though not in normal range yet.  His last A1C was 5.7 %. His weight is at 210 lbs, lower than entry weight. He states that he feels like he is making progress and that he continues to see his group of physicians to keep his risk factors in control.  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 PEddie Dibblescontinues to read food labels and watch his soduim intake,. He remains compliant with his medications PEddie Dibblesstates his weight was up 3 lb.  today but he is not short of breath.  He did eat out last night.  His BP has been good since med changes and BG have been between 112-115.  He has not been exercising at home but has been pressure washing outside against his Dr advice.  I suggested he ask for help and follow Dr advice as being outside in addition to the task can be too hard on his heart valve.  He is having an ultrasound done soon to check the valve.     Expected Outcomes PEddie Dibbleswill continue to make healthy lifestyle choices and see continued improvement in his health status.  PEddie Dibbleswill continue to make healthy lifestyle choices and see continued improvement in his health status. Short term goal: PEddie Dibbleswill continue to work with his medical team to gain better control of his blood pressure.  Short - PEddie Dibbleswil continue to exercise in class and will avoid outside work.  Long - PEddie Dibbleswill maintain exercise and good control of BP and BG.        Exercise Goals and Review:     Exercise Goals    Row Name 10/02/16 1445             Exercise Goals   Increase Physical Activity Yes       Intervention Provide advice, education, support and counseling about physical activity/exercise needs.;Develop an individualized exercise prescription for aerobic and resistive training based on initial evaluation findings, risk stratification, comorbidities and participant's personal  goals.       Expected Outcomes Achievement of increased cardiorespiratory fitness and enhanced flexibility, muscular endurance and strength shown through measurements of functional capacity and personal statement of participant.       Increase Strength and Stamina Yes       Intervention Provide advice, education, support and counseling about physical activity/exercise needs.;Develop an individualized exercise prescription for aerobic and resistive training based on initial evaluation findings, risk stratification, comorbidities and participant's personal goals.       Expected Outcomes Achievement of increased cardiorespiratory fitness and enhanced flexibility, muscular endurance and strength shown through measurements of functional capacity and personal statement of  participant.          Nutrition & Weight - Outcomes:     Pre Biometrics - 10/02/16 1445      Pre Biometrics   Height 5' 6"  (1.676 m)   Weight 209 lb 6.4 oz (95 kg)   Waist Circumference 44 inches   Hip Circumference 39 inches   Waist to Hip Ratio 1.13 %   BMI (Calculated) 33.9   Single Leg Stand 30 seconds         Post Biometrics - 12/26/16 0917       Post  Biometrics   Height 5' 6"  (1.676 m)   Weight 210 lb (95.3 kg)   Waist Circumference 44 inches   Hip Circumference 39 inches   Waist to Hip Ratio 1.13 %   BMI (Calculated) 33.91   Single Leg Stand 9.46 seconds      Nutrition:     Nutrition Therapy & Goals - 11/28/16 0915      Nutrition Therapy   RD appointment defered Yes  Continues to defer visit with RD      Nutrition Discharge:     Nutrition Assessments - 10/02/16 1337      MEDFICTS Scores   Pre Score 41      Education Questionnaire Score:     Knowledge Questionnaire Score - 01/02/17 0953      Knowledge Questionnaire Score   Pre Score 21/28   Post Score 23/28      Goals reviewed with patient; copy given to patient.

## 2017-01-09 NOTE — Progress Notes (Signed)
Daily Session Note  Patient Details  Name: [name] [name] MRN: [id number] Date of Birth: 1945-08-06 Referring Provider:     Cardiac Rehab from 10/02/2016 in [facility]  Referring Provider  [name] Laming MD      Encounter Date: 01/09/2017  Check In:     Session Check In - 01/09/17 0930      Check-In   Location [facility]-Cardiac & Pulmonary Rehab   Staff Present [name] Lark, RN, BSN, CCRP;[name] Salmons [place], MA, ACSM RCEP, Exercise Physiologist;[name] [name], BA, ACSM CEP, Exercise Physiologist   Supervising physician immediately available to respond to emergencies See telemetry face sheet for immediately available ER MD   Medication changes reported     No   Fall or balance concerns reported    No   Warm-up and Cool-down Performed on first and last piece of equipment   Resistance Training Performed Yes   VAD Patient? No     Pain Assessment   Currently in Pain? No/denies   Multiple Pain Sites No         History  Smoking Status  . Former Smoker  Smokeless Tobacco  . Former Systems developer  . Quit date: 05/16/2004    Goals Met:  Independence with exercise equipment Exercise tolerated well No report of cardiac concerns or symptoms Strength training completed today  Goals Unmet:  Not Applicable  Comments:  [name] [name] graduated today from cardiac rehab with 36 sessions completed.  Details of the patient's exercise prescription and what He needs to do in order to continue the prescription and progress were discussed with patient.  Patient was given a copy of prescription and goals.  Patient verbalized understanding.  [name] [name] plans to continue to exercise by walking and using bike at home.    Dr. Emily [name] is Medical Director for [facility].

## 2017-01-09 NOTE — Progress Notes (Signed)
Cardiac Individual Treatment Plan  Patient Details  Name: [name] [name] MRN: [id number] Date of Birth: [birth year] Referring Provider:     Cardiac Rehab from 10/02/2016 in [facility] Cardiac and Pulmonary Rehab  Referring Provider  [name] MD      Initial Encounter Date:    Cardiac Rehab from 10/02/2016 in [facility] Cardiac and Pulmonary Rehab  Date  10/02/16  Referring Provider  [name] MD      Visit Diagnosis: S/P TAVR (transcatheter aortic valve replacement)  Patient's Home Medications on Admission:  Current Outpatient Prescriptions:  .  acetaminophen (TYLENOL) 325 MG tablet, Take 2 tablets (650 mg total) by mouth every 4 (four) hours as needed for mild pain (temp > 101.5)., Disp: , Rfl:  .  Ascorbic Acid (VITAMIN C) 1000 MG tablet, Take 1,000 mg by mouth daily., Disp: , Rfl:  .  aspirin EC 81 MG EC tablet, Take 1 tablet (81 mg total) by mouth daily., Disp: , Rfl:  .  budesonide (PULMICORT) 0.5 MG/2ML nebulizer solution, Take 2 mLs (0.5 mg total) by nebulization 2 (two) times daily. (Patient not taking: Reported on 09/05/2016), Disp: , Rfl: 12 .  citalopram (CELEXA) 20 MG tablet, Take 1 tablet (20 mg total) by mouth daily., Disp: , Rfl:  .  clopidogrel (PLAVIX) 75 MG tablet, Take 75 mg by mouth daily., Disp: , Rfl:  .  famotidine (PEPCID) 20 MG tablet, Take 1 tablet (20 mg total) by mouth at bedtime. (Patient not taking: Reported on 09/05/2016), Disp: , Rfl:  .  fenofibrate 160 MG tablet, Take 160 mg by mouth daily., Disp: , Rfl:  .  fluticasone (FLONASE) 50 MCG/ACT nasal spray, Place 2 sprays into both nostrils daily., Disp: , Rfl: 2 .  furosemide (LASIX) 40 MG tablet, Take 1 tablet (40 mg total) by mouth 2 (two) times daily. (Patient not taking: Reported on 09/05/2016), Disp: 30 tablet, Rfl:  .  gabapentin (NEURONTIN) 100 MG capsule, Take 2 capsules (200 mg total) by mouth 2 (two) times daily., Disp: , Rfl:  .  heparin 5000 UNIT/ML injection, Inject 1 mL (5,000 Units  total) into the skin every 8 (eight) hours. (Patient not taking: Reported on 09/05/2016), Disp: 1 mL, Rfl:  .  hydrALAZINE (APRESOLINE) 50 MG tablet, Take 50 mg by mouth 2 (two) times daily., Disp: , Rfl:  .  insulin aspart (NOVOLOG) 100 UNIT/ML injection, Inject 0-9 Units into the skin every 4 (four) hours. (Patient not taking: Reported on 09/05/2016), Disp: 10 mL, Rfl: 11 .  ipratropium-albuterol (DUONEB) 0.5-2.5 (3) MG/3ML SOLN, Take 3 mLs by nebulization every 6 (six) hours., Disp: 360 mL, Rfl:  .  methocarbamol (ROBAXIN) 750 MG tablet, Take 750 mg by mouth 4 (four) times daily., Disp: , Rfl:  .  metoprolol succinate (TOPROL-XL) 50 MG 24 hr tablet, Take 50 mg by mouth daily. Take with or immediately following a meal., Disp: , Rfl:  .  pantoprazole (PROTONIX) 40 MG tablet, Take 40 mg by mouth daily., Disp: , Rfl:  .  Potassium 95 MG TABS, Take by mouth., Disp: , Rfl:  .  sodium chloride (OCEAN) 0.65 % SOLN nasal spray, Place 1 spray into both nostrils as needed for congestion., Disp: , Rfl: 0 .  tamsulosin (FLOMAX) 0.4 MG CAPS capsule, Take 1 capsule (0.4 mg total) by mouth daily., Disp: 30 capsule, Rfl:  .  torsemide (DEMADEX) 20 MG tablet, Take 20 mg by mouth daily., Disp: , Rfl:   Past Medical History: Past Medical History:  Diagnosis Date  . CHF (congestive heart failure) ([place])   . COPD (chronic obstructive pulmonary disease) ([place])   . Diabetes mellitus without complication ([place])   . Hypertension   . Kidney stones   . Renal disorder     Tobacco Use: History  Smoking Status  . Former Smoker  Smokeless Tobacco  . Former Systems developer  . Quit date: 05/16/2004    Labs: Recent Review Flowsheet Data    Labs for ITP Cardiac and Pulmonary Rehab Latest Ref Rng & Units 07/26/2012 07/05/2016 07/06/2016   Cholestrol 0 - 200 mg/dL 100 - -   LDLCALC 0 - 100 mg/dL 34 - -   HDL 40 - 60 mg/dL 35(L) - -   Trlycerides 0 - 200 mg/dL 157 - -   Hemoglobin A1c 4.2 - 6.3 % 6.2 - -   PHART 7.350 - 7.450 - 7.35  7.35   PCO2ART 32.0 - 48.0 mmHg - 49(H) 48   HCO3 20.0 - 28.0 mmol/L - 27.1 26.5   O2SAT % - 94.7 96.4       Exercise Target Goals:    Exercise Program Goal: Individual exercise prescription set with THRR, safety & activity barriers. Participant demonstrates ability to understand and report RPE using BORG scale, to self-measure pulse accurately, and to acknowledge the importance of the exercise prescription.  Exercise Prescription Goal: Starting with aerobic activity 30 plus minutes a day, 3 days per week for initial exercise prescription. Provide home exercise prescription and guidelines that participant acknowledges understanding prior to discharge.  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   Cardiac Risk Stratification High      6 Minute Walk:     6 Minute Walk    Row Name 10/02/16 1436 12/26/16 0918       6 Minute Walk   Phase Initial Discharge    Distance 1020 feet 1360 feet    Distance % Change  - 33 %    Distance Feet Change  - 340 ft    Walk Time 6 minutes 6 minutes    # of Rest Breaks 0 0    MPH 1.93 2.57    METS 2.24 2.59    RPE 9 15    Perceived Dyspnea  3  -    VO2 Peak 7.86 9.07    Symptoms Yes (comment) Yes (comment)    Comments SOB, knees felt weak legs feel weak    Resting HR 83 bpm 68 bpm    Resting BP 146/74 128/56    Resting Oxygen Saturation   - 96 %    Exercise Oxygen Saturation  during 6 min walk  - 88 %    Max Ex. HR 119 bpm 124 bpm    Max Ex. BP 146/74 192/68    2 Minute Post BP 128/64  -      Interval HR   Baseline HR (retired) 83  -    1 Minute HR 87 90    2 Minute HR 85 108    3 Minute HR 87 116    4 Minute HR 83 122    5 Minute HR 84 124    6 Minute HR 86 122    2  Minute Post HR 88  -  Interval Heart Rate? Yes  -      Interval Oxygen   Interval Oxygen? Yes Yes    Baseline Oxygen Saturation % 94 %  95% on Room Air 96 %    Resting Liters of Oxygen 2 L  -    1 Minute Oxygen Saturation % 87 % 93 %    1 Minute Liters of Oxygen 0 L  Room Air 3 L    2 Minute Oxygen Saturation % 85 % 90 %    2 Minute Liters of Oxygen 0 L 3 L    3 Minute Oxygen Saturation % 87 % 89 %    3 Minute Liters of Oxygen 0 L 3 L    4 Minute Oxygen Saturation % 83 % 88 %    4 Minute Liters of Oxygen 0 L 3 L    5 Minute Oxygen Saturation % 84 % 88 %    5 Minute Liters of Oxygen 0 L 3 L    6 Minute Oxygen Saturation % 86 % 88 %    6 Minute Liters of Oxygen 0 L 3 L    2 Minute Post Oxygen Saturation % 95 %  -    2 Minute Post Liters of Oxygen 0 L 3 L       Oxygen Initial Assessment:   Oxygen Re-Evaluation:     Oxygen Re-Evaluation    Row Name 11/28/16 2947 11/28/16 0940           Program Oxygen Prescription   Program Oxygen Prescription Continuous  -      Liters per minute 2  2-3 liters   -      Comments [name] [name] adjusts his oxygen according to his activity levels.  -        Home Oxygen   Home Oxygen Device  - Home Concentrator;Portable Concentrator      Sleep Oxygen Prescription  - Continuous;CPAP      Liters per minute  - 3      Home Exercise Oxygen Prescription  - Continuous      Liters per minute  - 2  uses 2-3 per activity      Home at Rest Exercise Oxygen Prescription  - Continuous      Liters per minute  - 2      Compliance with Home Oxygen Use  - Yes        Goals/Expected Outcomes   Short Term Goals  - To learn and understand importance of monitoring SPO2 with pulse oximeter and demonstrate accurate use of the pulse oximeter.;To Learn and understand importance of maintaining oxygen saturations>88%;To learn and demonstrate proper purse lipped breathing techniques or other breathing techniques.      Long  Term Goals  - Exhibits compliance with  exercise, home and travel O2 prescription;Verbalizes importance of monitoring SPO2 with pulse oximeter and return demonstration;Maintenance of O2 saturations>88%;Exhibits proper breathing techniques, such as purse lipped breathing or other method taught during program session      Comments  - [name] [name] is compliant with his use of oxygen at home and during activities in the program.  We reviewed the need to keep his saturation above 88%. He has a pulse oximeter at home and will bring it to a session to see demonstrate proper use of the oximeter.  He demonstrated great purse lipped nreathing technique.      Goals/Expected Outcomes  - [name] [name] will maintian oxygen stauration above 88% by monitoring his levels during activity and rest.  Long term goal [name] [name] will continue to be compliant with his oxygen use.          Oxygen Discharge (Final Oxygen Re-Evaluation):     Oxygen Re-Evaluation - 11/28/16 0940      Home Oxygen   Home Oxygen Device Home Concentrator;Portable Concentrator   Sleep Oxygen Prescription Continuous;CPAP   Liters per minute 3   Home Exercise Oxygen Prescription Continuous   Liters per minute 2  uses 2-3 per activity   Home at Rest Exercise Oxygen Prescription Continuous   Liters per minute 2   Compliance with Home Oxygen Use Yes     Goals/Expected Outcomes   Short Term Goals To learn and understand importance of monitoring SPO2 with pulse oximeter and demonstrate accurate use of the pulse oximeter.;To Learn and understand importance of maintaining oxygen saturations>88%;To learn and demonstrate proper purse lipped breathing techniques or other breathing techniques.   Long  Term Goals Exhibits compliance with exercise, home and travel O2 prescription;Verbalizes importance of monitoring SPO2 with pulse oximeter and return demonstration;Maintenance of O2 saturations>88%;Exhibits proper breathing techniques, such as purse lipped breathing or other method taught during program session    Comments [name] [name] is compliant with his use of oxygen at home and during activities in the program.  We reviewed the need to keep his saturation above 88%. He has a pulse oximeter at home and will bring it to a session to see demonstrate proper use of the oximeter.  He demonstrated great purse lipped nreathing technique.   Goals/Expected Outcomes [name] [name] will maintian oxygen stauration above 88% by monitoring his levels during activity and rest. Long term goal [name] [name] will continue to be compliant with his oxygen use.       Initial Exercise Prescription:     Initial Exercise Prescription - 10/02/16 1400      Date of Initial Exercise RX and Referring Provider   Date 10/02/16   Referring Provider [name] MD     Oxygen   Oxygen Continuous   Liters 2     Treadmill   MPH 1.6   Grade 0.5   Minutes 15   METs 2.34     Recumbant Bike   Level 1   RPM 25   Watts 7   Minutes 15   METs 2.2     T5 Nustep   Level 1   SPM 80   Minutes 15   METs 2     Prescription Details   Frequency (times per week) 2   Duration Progress to 45 minutes of aerobic exercise without signs/symptoms of physical distress     Intensity   THRR 40-80% of Max Heartrate 109-135   Ratings of Perceived Exertion 11-13   Perceived Dyspnea 0-4     Progression   Progression Continue to progress workloads to maintain intensity without signs/symptoms of physical distress.     Resistance Training   Training Prescription Yes   Weight 3 lbs   Reps 10-15      Perform Capillary Blood Glucose checks as needed.  Exercise Prescription Changes:     Exercise Prescription Changes    Row Name 10/02/16 1400 10/05/16 1000 10/24/16 1000 11/01/16 1400 11/15/16 1400     Response to Exercise   Blood Pressure (Admit) 146/74 128/56  - 132/76 148/72   Blood Pressure (Exercise) 146/74 138/70  - 160/80 146/74   Blood Pressure (Exit) 128/64 124/58  - 140/70 120/62   Heart Rate (Admit) 83 bpm 78 bpm  - 87 bpm 78  bpm   Heart Rate  (Exercise) 119 bpm 109 bpm  - 111 bpm 117 bpm   Heart Rate (Exit) 88 bpm 67 bpm  - 78 bpm 71 bpm   Oxygen Saturation (Admit) 94 %  -  -  -  -   Oxygen Saturation (Exercise) 83 % 85 %  -  -  -   Oxygen Saturation (Exit) 95 %  -  -  -  -   Rating of Perceived Exertion (Exercise) 9 13  - 15 13   Perceived Dyspnea (Exercise) 3  -  -  -  -   Symptoms SOB, knees felt weak none  - none none   Comments walk test results first full day of exercise  -  -  -   Duration  - Progress to 45 minutes of aerobic exercise without signs/symptoms of physical distress  - Progress to 45 minutes of aerobic exercise without signs/symptoms of physical distress Progress to 45 minutes of aerobic exercise without signs/symptoms of physical distress   Intensity  - THRR unchanged  - THRR unchanged THRR unchanged     Progression   Progression  - Continue to progress workloads to maintain intensity without signs/symptoms of physical distress.  - Continue to progress workloads to maintain intensity without signs/symptoms of physical distress. Continue to progress workloads to maintain intensity without signs/symptoms of physical distress.   Average METs  - 2.22  - 2.6 3.08     Resistance Training   Training Prescription  - Yes  - Yes Yes   Weight  - 3 lbs  - 4 lb 4 lb   Reps  - 10-15  - 10-15 10-15     Interval Training   Interval Training  - No  - No No     Oxygen   Oxygen  - Continuous  - Continuous Continuous   Liters  - 2  increased to 3 on treadmill  - 2 2     Treadmill   MPH  - 1.6  -  - 1.4   Grade  - 0.5  -  - 4   Minutes  - 15  -  - 15   METs  - 2.34  -  - 3.11     Recumbant Bike   Level  -  -  - 1 1   Watts  -  -  - 22 26   Minutes  -  -  - 15 15   METs  -  -  - 2.6 2.83     T5 Nustep   Level  - 1  -  - 1   Minutes  - 15  -  - 15   METs  - 2.1  -  - 3.3     Home Exercise Plan   [name] to continue exercise at  -  - Home (comment)  treadmill and bike  - Home (comment)  treadmill and bike    Frequency  -  - Add 2 additional days to program exercise sessions.  - Add 2 additional days to program exercise sessions.   Initial Home Exercises Provided  -  - 10/24/16  - 10/24/16   Row Name 11/30/16 1500 12/13/16 1500 12/26/16 1500         Response to Exercise   Blood Pressure (Admit) 136/64 136/70 138/58     Blood Pressure (Exercise) 126/56 150/64 192/68     Blood Pressure (Exit) 130/60 120/60 122/58     Heart  Rate (Admit) 83 bpm 80 bpm 90 bpm     Heart Rate (Exercise) 102 bpm 110 bpm 124 bpm     Heart Rate (Exit) 71 bpm 79 bpm 78 bpm     Rating of Perceived Exertion (Exercise) '15 17 15     '$ Symptoms none SOB on treadmill none     Duration Progress to 45 minutes of aerobic exercise without signs/symptoms of physical distress Continue with 45 min of aerobic exercise without signs/symptoms of physical distress. Continue with 45 min of aerobic exercise without signs/symptoms of physical distress.     Intensity THRR unchanged THRR unchanged THRR unchanged       Progression   Progression Continue to progress workloads to maintain intensity without signs/symptoms of physical distress. Continue to progress workloads to maintain intensity without signs/symptoms of physical distress. Continue to progress workloads to maintain intensity without signs/symptoms of physical distress.     Average METs 2.75 3.01 3.01       Resistance Training   Training Prescription Yes Yes Yes     Weight 4 lb 4 lb 4 lb     Reps 10-15 10-15 10-15       Interval Training   Interval Training No No No       Oxygen   Oxygen Continuous Continuous Continuous     Liters '2 2 2       '$ Treadmill   MPH 1.4 2.4 2.4     Grade 4 0.5 0.5     Minutes '15 15 15     '$ METs 3.'11 3 3       '$ Recumbant Bike   Level '1 1 1     '$ Watts '27 22 25     '$ Minutes '15 15 15     '$ METs 2.83 2.83 2.83       T5 Nustep   Level '1 3 3     '$ Minutes '15 15 15     '$ METs 2.3 3.1 3.2       Home Exercise Plan   [name] to continue exercise at Home  (comment)  treadmill and bike Home (comment)  treadmill and bike Home (comment)  treadmill and bike     Frequency Add 2 additional days to program exercise sessions. Add 2 additional days to program exercise sessions. Add 2 additional days to program exercise sessions.     Initial Home Exercises Provided 10/24/16 10/24/16 10/24/16        Exercise Comments:     Exercise Comments    Row Name 10/05/16 1037 01/09/17 0931         Exercise Comments First full day of exercise!  Patient was oriented to gym and equipment including functions, settings, policies, and procedures.  Patient's individual exercise prescription and treatment plan were reviewed.  All starting workloads were established based on the results of the 6 minute walk test done at initial orientation visit.  The plan for exercise progression was also introduced and progression will be customized based on patient's performance and goals. [place] graduated today from cardiac rehab with 36 sessions completed.  Details of the patient's exercise prescription and what He needs to do in order to continue the prescription and progress were discussed with patient.  Patient was given a copy of prescription and goals.  Patient verbalized understanding.  [name] [name] to continue to exercise by walking and using bike at home.         Exercise Goals and Review:     Exercise Goals  [place] Name 10/02/16 1445             Exercise Goals   Increase Physical Activity Yes       Intervention Provide advice, education, support and counseling about physical activity/exercise needs.;Develop an individualized exercise prescription for aerobic and resistive training based on initial evaluation findings, risk stratification, comorbidities and participant's personal goals.       Expected Outcomes Achievement of increased cardiorespiratory fitness and enhanced flexibility, muscular endurance and strength shown through measurements of functional capacity  and personal statement of participant.       Increase Strength and Stamina Yes       Intervention Provide advice, education, support and counseling about physical activity/exercise needs.;Develop an individualized exercise prescription for aerobic and resistive training based on initial evaluation findings, risk stratification, comorbidities and participant's personal goals.       Expected Outcomes Achievement of increased cardiorespiratory fitness and enhanced flexibility, muscular endurance and strength shown through measurements of functional capacity and personal statement of participant.          Exercise Goals Re-Evaluation :     Exercise Goals Re-Evaluation    Row Name 10/05/16 1037 10/17/16 1328 10/24/16 1011 11/01/16 1455 11/15/16 1358     Exercise Goal Re-Evaluation   Exercise Goals Review Increase Physical Activity  - Increase Physical Activity;Increase Strenth and Stamina Increase Physical Activity;Increase Strenth and Stamina Increase Physical Activity;Increase Strenth and Stamina   Comments [name] [name] has completed his first full day of exercise!  We will continue to monitor his progression Out since last review due to knee pain.  He was cleared to return by orthopedics today. Reviewed home exercise with pt today.  Pt [name] to walk and bike for exercise.  Reviewed THR, pulse, RPE, sign and symptoms, NTG use, and when to call 911 or MD.  Also discussed weather considerations and indoor options.  Pt voiced understanding. [name] [name] has increased his [place]cal [place] on the bike and weight for strength work. [name] [name] has continued to do well in rehab.  He is now up to 4% grade on the treadmill but he cannot go faster.  We will continue to monitor his progression.   Expected Outcomes Continue to come to classes to work on increasing physical activity.  - Short - start home exercise Long - continue to exercise independently [place] will continue to increase his worklaod and improve overall functional capacity.   [place] wil graduate and continue to exercise independently. Short: Work on increasing workload on stepper.  Long: Continue to work on independent exercise.    [place] Name 11/30/16 1521 12/13/16 1511 12/26/16 1520         Exercise Goal Re-Evaluation   Exercise Goals Review Increase Physical Activity;Increase Strenth and Stamina Increase Physical Activity;Increase Strenth and Stamina Increase Physical Activity;Increase Strength and Stamina     Comments [name] [name] continues to do well in rehab.  He is up to 27 watts on the recumbent bike.  We will continue to monitor his progress. [name] [name] has been doing well in rehab.  He continues to make improvements.  He still has the hardest time on the treadmill but he gets his full 15 min on there.  We will continue to monitor his progression.  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 [name] [name] improved his post 6MWT by 340 ft!!  He was able to keep his oxygen saturations around 88% during his walk test. His biggest improvement was that he was able to  walk at 2.57 mph versus 1.93 mph.  He is going to continue to exercise by walking at home.   We will continue to monitor his progress.      Expected Outcomes Short: Increase workload on stepper.  Long: Continue to work towards more independent exercise.  Short: Improve his distance.  Long: Continue to exercise independently.Marland Kitchen Short: Continue to exercise regularly.  Long: Continue to exercise independently even after graduation.         Discharge Exercise Prescription (Final Exercise Prescription Changes):     Exercise Prescription Changes - 12/26/16 1500      Response to Exercise   Blood Pressure (Admit) 138/58   Blood Pressure (Exercise) 192/68   Blood Pressure (Exit) 122/58   Heart Rate (Admit) 90 bpm   Heart Rate (Exercise) 124 bpm   Heart Rate (Exit) 78 bpm   Rating of Perceived Exertion (Exercise) 15   Symptoms none   Duration Continue with 45 min of aerobic exercise without  signs/symptoms of physical distress.   Intensity THRR unchanged     Progression   Progression Continue to progress workloads to maintain intensity without signs/symptoms of physical distress.   Average METs 3.01     Resistance Training   Training Prescription Yes   Weight 4 lb   Reps 10-15     Interval Training   Interval Training No     Oxygen   Oxygen Continuous   Liters 2     Treadmill   MPH 2.4   Grade 0.5   Minutes 15   METs 3     Recumbant Bike   Level 1   Watts 25   Minutes 15   METs 2.83     T5 Nustep   Level 3   Minutes 15   METs 3.2     Home Exercise Plan   [name] to continue exercise at Home (comment)  treadmill and bike   Frequency Add 2 additional days to program exercise sessions.   Initial Home Exercises Provided 10/24/16      Nutrition:  Target Goals: Understanding of nutrition guidelines, daily intake of sodium 1500mg , cholesterol 200mg , calories 30% from fat and 7% or less from saturated fats, daily to have 5 or more servings of fruits and vegetables.  Biometrics:     Pre Biometrics - 10/02/16 1445      Pre Biometrics   Height 5\' 6"  (1.676 m)   Weight 209 lb 6.4 oz (95 kg)   Waist Circumference 44 inches   Hip Circumference 39 inches   Waist to Hip Ratio 1.13 %   BMI (Calculated) 33.9   Single Leg Stand 30 seconds         Post Biometrics - 12/26/16 0917       Post  Biometrics   Height 5\' 6"  (1.676 m)   Weight 210 lb (95.3 kg)   Waist Circumference 44 inches   Hip Circumference 39 inches   Waist to Hip Ratio 1.13 %   BMI (Calculated) 33.91   Single Leg Stand 9.46 seconds      Nutrition Therapy Plan and Nutrition Goals:     Nutrition Therapy & Goals - 11/28/16 0915      Nutrition Therapy   RD appointment defered Yes  Continues to defer visit with RD      Nutrition Discharge: Rate Your Plate Scores:     Nutrition Assessments - 10/02/16 1337      MEDFICTS Scores   Pre Score 41  Nutrition Goals  Re-Evaluation:     Nutrition Goals Re-Evaluation    [place] Name 10/19/16 0851 10/26/16 0914           Goals   Current Weight  - 217 lb 3.2 oz (98.5 kg)      Nutrition Goal  - -      Comment declined nutrition appointment [name] [name] wants to continue to lose weight and monitors his weight daily.  He is reading food labels and watching sodium intake.  He has lost around 30 lb.        Expected Outcome maintain heart healthy diet [name] [name] will continue to eat heart healthy diet and see continued weight management.         Nutrition Goals Discharge (Final Nutrition Goals Re-Evaluation):     Nutrition Goals Re-Evaluation - 10/26/16 0914      Goals   Current Weight 217 lb 3.2 oz (98.5 kg)   Nutrition Goal --   Comment [name] [name] wants to continue to lose weight and monitors his weight daily.  He is reading food labels and watching sodium intake.  He has lost around 30 lb.     Expected Outcome [name] [name] will continue to eat heart healthy diet and see continued weight management.      Psychosocial: Target Goals: Acknowledge presence or absence of significant depression and/or stress, maximize coping skills, provide positive support system. Participant is able to verbalize types and ability to use techniques and skills needed for reducing stress and depression.   Initial Review & Psychosocial Screening:     Initial Psych Review & Screening - 10/02/16 1359      Screening Interventions   Interventions Encouraged to exercise      Quality of Life Scores:      Quality of Life - 01/02/17 0955      Quality of Life Scores   Health/Function Pre 26.54 %   Health/Function Post 26.47 %   Health/Function % Change -0.26 %   Socioeconomic Pre 26.64 %   Socioeconomic Post 26 %   Socioeconomic % Change  -2.4 %   Psych/Spiritual Pre 27.5 %   Psych/Spiritual Post 27.07 %   Psych/Spiritual % Change -1.56 %   Family Pre 28.13 %   Family Post 28.5 %   Family % Change 1.32 %   GLOBAL Pre 26.97 %   GLOBAL Post  26.77 %   GLOBAL % Change -0.74 %      PHQ-9: Recent Review Flowsheet Data    Depression screen [facility] 2/9 01/02/2017 10/02/2016   Decreased Interest 0 0   Down, Depressed, Hopeless 0 0   PHQ - 2 Score 0 0   Altered sleeping 2 0   Tired, decreased energy 0 0   Change in appetite 0 0   Feeling bad or failure about yourself  0 0   Trouble concentrating 0 0   Moving slowly or fidgety/restless 0 0   Suicidal thoughts 0 0   PHQ-9 Score 2 0     Interpretation of Total Score  Total Score Depression Severity:  1-4 = Minimal depression, 5-9 = Mild depression, 10-14 = Moderate depression, 15-19 = Moderately severe depression, 20-27 = Severe depression   Psychosocial Evaluation and Intervention:     Psychosocial Evaluation - 10/26/16 0858      Psychosocial Evaluation & Interventions   Interventions Relaxation education;Encouraged to exercise with the program and follow exercise prescription;Stress management education   Comments Counselor met with [name] [name] today [name] [name]) for initial psychosocial  evaluation.  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  He also struggles with CHF and Diabetes.  [name] [name] has a strong support system with a spouse of 32 years; a son and daughter who live close by and he is actively involved in his local church.   [name] [name] has some difficulty sleeping at times with approximately 4-5 hours per night - partically due to the fluid medications he takes.  [name] [name] denies a history of depression or anxiety; but therapist noticed he is on Celexa at this time.  He reports his mood is generally stable most of the time.  He has multiple stressors with his own health and that of his spouse.  He has goals to lose weight while in this program and to increase  his stamina and strength.  He has already noticed some progress in these areas since the surgery.  [name] [name] [name] to work out at home on his bike or treadmill.  Staff will continue to follow with [name] [name] throughout the  course of this program.     Expected Outcomes [name] [name] will benefit from consistent exercise to achieve his stated goals.  He will also need to meet with the dietician to address his weight loss goals.  [name] [name] will be participating in the psychoeducational components of this program to learn better coping strategies to deal with his mood and stress concerns.     Continue Psychosocial Services  Follow up required by staff      Psychosocial Re-Evaluation:     Psychosocial Re-Evaluation    [place] Name 11/28/16 [phone]             Psychosocial Re-Evaluation   Current issues with Current Sleep Concerns       Comments [name] [name] is doing well, is having some stress in past month over BP fluctuations.  Is seeing MD team to work on BP readings.  Med changes are helping, though not back to best range. Sees Kidney doctor next week.  Feels better since has gained better control with BP.        Expected Outcomes PAul will be able to manage stress of BP readngs and working with his medical team to gain control.   Long term, BP under control and stress reduced.        Interventions Encouraged to attend Cardiac Rehabilitation for the exercise;Relaxation education;Stress management education       Continue Psychosocial Services  Follow up required by staff          Psychosocial Discharge (Final Psychosocial Re-Evaluation):     Psychosocial Re-Evaluation - 11/28/16 0917      Psychosocial Re-Evaluation   Current issues with Current Sleep Concerns   Comments [name] [name] is doing well, is having some stress in past month over BP fluctuations.  Is seeing MD team to work on BP readings.  Med changes are helping, though not back to best range. Sees Kidney doctor next week.  Feels better since has gained better control with BP.    Expected Outcomes PAul will be able to manage stress of BP readngs and working with his medical team to gain control.   Long term, BP under control and stress reduced.    Interventions Encouraged to  attend Cardiac Rehabilitation for the exercise;Relaxation education;Stress management education   Continue Psychosocial Services  Follow up required by staff      Vocational Rehabilitation: Provide vocational rehab assistance to qualifying candidates.   Vocational Rehab Evaluation & Intervention:  Vocational Rehab - 10/02/16 1351      Initial Vocational Rehab Evaluation & Intervention   Assessment shows need for Vocational Rehabilitation No      Education: Education Goals: Education classes will be provided on a variety of topics geared toward better understanding of heart health and risk factor modification. Participant will state understanding/return demonstration of topics presented as noted by education test scores.  Learning Barriers/Preferences:     Learning Barriers/Preferences - 10/02/16 1349      Learning Barriers/Preferences   Learning Barriers Hearing   Learning Preferences Individual Instruction;Verbal Instruction      Education Topics: General Nutrition Guidelines/Fats and Fiber: -Group instruction provided by verbal, written material, models and posters to present the general guidelines for heart healthy nutrition. Gives an explanation and review of dietary fats and fiber.   Cardiac Rehab from 01/09/2017 in [facility] Cardiac and Pulmonary Rehab  Date  12/12/16  Educator  CR  Instruction Review Code  1- Verbalizes Understanding      Controlling Sodium/Reading Food Labels: -Group verbal and written material supporting the discussion of sodium use in heart healthy nutrition. Review and explanation with models, verbal and written materials for utilization of the food label.   Cardiac Rehab from 01/09/2017 in [facility] Cardiac and Pulmonary Rehab  Date  10/24/16  Educator  PI      Exercise Physiology & Risk Factors: - Group verbal and written instruction with models to review the exercise physiology of the cardiovascular system and associated critical values.  Details cardiovascular disease risk factors and the goals associated with each risk factor.   Cardiac Rehab from 01/09/2017 in [facility] Cardiac and Pulmonary Rehab  Date  12/28/16  Educator  [facility]  Instruction Review Code  1- Verbalizes Understanding      Aerobic Exercise & Resistance Training: - Gives group verbal and written discussion on the health impact of inactivity. On the components of aerobic and resistive training programs and the benefits of this training and how to safely progress through these programs.   Cardiac Rehab from 01/09/2017 in [facility] Cardiac and Pulmonary Rehab  Date  01/02/17  Educator  [facility]  Instruction Review Code  1- Verbalizes Understanding      Flexibility, Balance, General Exercise Guidelines: - Provides group verbal and written instruction on the benefits of flexibility and balance training programs. Provides general exercise guidelines with specific guidelines to those with heart or lung disease. Demonstration and skill practice provided.   Cardiac Rehab from 01/09/2017 in [facility] Cardiac and Pulmonary Rehab  Date  11/07/16  Educator  AS      Stress Management: - Provides group verbal and written instruction about the health risks of elevated stress, cause of high stress, and healthy ways to reduce stress.   Cardiac Rehab from 01/09/2017 in [facility] Cardiac and Pulmonary Rehab  Date  01/09/17  Educator  [facility]  Instruction Review Code  1- Verbalizes Understanding      Depression: - Provides group verbal and written instruction on the correlation between heart/lung disease and depressed mood, treatment options, and the stigmas associated with seeking treatment.   Cardiac Rehab from 01/09/2017 in [facility] Cardiac and Pulmonary Rehab  Date  12/07/16  Educator  [facility]  Instruction Review Code (retired)  2- meets Designer, fashion/clothing & Physiology of the Heart: - Group verbal and written instruction and models provide basic cardiac anatomy and physiology, with the coronary  electrical and arterial systems. Review of: AMI, Angina, Valve disease, Heart Failure, Cardiac  Arrhythmia, Pacemakers, and the ICD.   Cardiac Procedures: - Group verbal and written instruction to review commonly prescribed medications for heart disease. Reviews the medication, class of the drug, and side effects. Includes the steps to properly store meds and maintain the prescription regimen. (beta blockers and nitrates)   Cardiac Rehab from 01/09/2017 in [facility] Cardiac and Pulmonary Rehab  Date  11/21/16  Educator  CE      Cardiac Medications I: - Group verbal and written instruction to review commonly prescribed medications for heart disease. Reviews the medication, class of the drug, and side effects. Includes the steps to properly store meds and maintain the prescription regimen.   Cardiac Rehab from 01/09/2017 in [facility] Cardiac and Pulmonary Rehab  Date  11/28/16 [6/14 Part Two]  Educator  SB [8/7 Part One 8/9 Part Two]      Cardiac Medications II: -Group verbal and written instruction to review commonly prescribed medications for heart disease. Reviews the medication, class of the drug, and side effects. (all other drug classes)    Go Sex-Intimacy & Heart Disease, Get SMART - Goal Setting: - Group verbal and written instruction through game format to discuss heart disease and the return to sexual intimacy. Provides group verbal and written material to discuss and apply goal setting through the application of the S.M.A.R.T. Method.   Cardiac Rehab from 01/09/2017 in [facility] Cardiac and Pulmonary Rehab  Date  11/21/16  Educator  CE      Other Matters of the Heart: - Provides group verbal, written materials and models to describe Heart Failure, Angina, Valve Disease, Peripheral Artery Disease, and Diabetes in the realm of heart disease. Includes description of the disease process and treatment options available to the cardiac patient.   Exercise & Equipment Safety: - Individual verbal  instruction and demonstration of equipment use and safety with use of the equipment.   Cardiac Rehab from 01/09/2017 in [facility] Cardiac and Pulmonary Rehab  Date  10/02/16  Educator  C. EnterkinRN      Infection Prevention: - Provides verbal and written material to individual with discussion of infection control including proper hand washing and proper equipment cleaning during exercise session.   Cardiac Rehab from 01/09/2017 in [facility] Cardiac and Pulmonary Rehab  Date  10/02/16  Educator  C. Enterkin, RN      Falls Prevention: - Provides verbal and written material to individual with discussion of falls prevention and safety.   Cardiac Rehab from 01/09/2017 in [facility] Cardiac and Pulmonary Rehab  Date  10/02/16  Educator  C. Enterkin,RN  Instruction Review Code (retired)  2- meets goals/outcomes      Diabetes: - Individual verbal and written instruction to review signs/symptoms of diabetes, desired ranges of glucose level fasting, after meals and with exercise. Acknowledge that pre and post exercise glucose checks will be done for 3 sessions at entry of program.   Cardiac Rehab from 01/09/2017 in [facility] Cardiac and Pulmonary Rehab  Date  10/02/16  Educator  C. Enterkin,RN      Other: -Provides group and verbal instruction on various topics (see comments)    Knowledge Questionnaire Score:     Knowledge Questionnaire Score - 01/02/17 0953      Knowledge Questionnaire Score   Pre Score 21/28   Post Score 23/28      Core Components/Risk Factors/Patient Goals at Admission:     Personal Goals and Risk Factors at Admission - 10/02/16 1358      Core Components/Risk Factors/Patient Goals on Admission  Weight Management Yes;Obesity;Weight Loss   Intervention Weight Management: Develop a combined nutrition and exercise program designed to reach desired caloric intake, while maintaining appropriate intake of nutrient and fiber, sodium and fats, and appropriate energy expenditure  required for the weight goal.;Weight Management: Provide education and appropriate resources to help participant work on and attain dietary goals.;Weight Management/Obesity: Establish reasonable short term and long term weight goals.;Obesity: Provide education and appropriate resources to help participant work on and attain dietary goals.   Admit Weight 176 lb 12.8 oz (80.2 kg)   Goal Weight: Short Term 171 lb (77.6 kg)   Goal Weight: Long Term 166 lb (75.3 kg)   Expected Outcomes Short Term: Continue to assess and modify interventions until short term weight is achieved;Long Term: Adherence to nutrition and physical activity/exercise program aimed toward attainment of established weight goal;Weight Loss: Understanding of general recommendations for a balanced deficit meal plan, which promotes 1-2 lb weight loss per week and includes a negative energy balance of [phone] kcal/d;Understanding recommendations for meals to include 15-35% energy as protein, 25-35% energy from fat, 35-60% energy from carbohydrates, less than '200mg'$  of dietary cholesterol, 20-35 gm of total fiber daily;Understanding of distribution of calorie intake throughout the day with the consumption of 4-5 meals/snacks   Diabetes Yes   Intervention Provide education about signs/symptoms and action to take for hypo/hyperglycemia.;Provide education about proper nutrition, including hydration, and aerobic/resistive exercise prescription along with prescribed medications to achieve blood glucose in normal ranges: Fasting glucose 65-99 mg/dL   Expected Outcomes Short Term: Participant verbalizes understanding of the signs/symptoms and immediate care of hyper/hypoglycemia, proper foot care and importance of medication, aerobic/resistive exercise and nutrition plan for blood glucose control.;Long Term: Attainment of HbA1C < 7%.   Hypertension Yes   Intervention Provide education on lifestyle modifcations including regular physical  activity/exercise, weight management, moderate sodium restriction and increased consumption of fresh fruit, vegetables, and low fat dairy, alcohol moderation, and smoking cessation.;Monitor prescription use compliance.   Expected Outcomes Short Term: Continued assessment and intervention until BP is < 140/84m HG in hypertensive participants. < 130/850mHG in hypertensive participants with diabetes, heart failure or chronic kidney disease.;Long Term: Maintenance of blood pressure at goal levels.   Lipids Yes   Intervention Provide education and support for participant on nutrition & aerobic/resistive exercise along with prescribed medications to achieve LDL '70mg'$ , HDL >'40mg'$ .   Expected Outcomes Short Term: Participant states understanding of desired cholesterol values and is compliant with medications prescribed. Participant is following exercise prescription and nutrition guidelines.;Long Term: Cholesterol controlled with medications as prescribed, with individualized exercise RX and with personalized nutrition plan. Value goals: LDL < '70mg'$ , HDL > 40 mg.      Core Components/Risk Factors/Patient Goals Review:      Goals and Risk Factor Review    Row Name 10/26/16 [id number] 12/21/16 1223         Core Components/Risk Factors/Patient Goals Review   Personal Goals Review Weight Management/Obesity;Heart Failure;Diabetes Weight Management/Obesity;Diabetes;Lipids;Hypertension;Stress Weight Management/Obesity;Diabetes;Hypertension;Improve shortness of breath with ADL's     Review PaEddie Dibblesants to continue to lose weight and monitors his weight daily.  He is reading food labels and watching sodium intake.  He has lost around 30 lb.  His BG at home is between 110-150.  All readings taken here have been within acceptable ranges. PaEddie Dibblestates his BP has been up and down and that he is seeing his physicians to work on getting better control. He is noticing improvement with his  BPreadings, though not in  normal range yet.  His last A1C was 5.7 %. His weight is at 210 lbs, lower than entry weight. He states that he feels like he is making progress and that he continues to see his group of physicians to keep his risk factors in control.  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 [name] [name] continues to read food labels and watch his soduim intake,. He remains compliant with his medications [name] [name] states his weight was up 3 lb.  today but he is not short of breath.  He did eat out last night.  His BP has been good since med changes and BG have been between 112-115.  He has not been exercising at home but has been pressure washing outside against his Dr advice.  I suggested he ask for help and follow Dr advice as being outside in addition to the task can be too hard on his heart valve.  He is having an ultrasound done soon to check the valve.     Expected Outcomes [name] [name] will continue to make healthy lifestyle choices and see continued improvement in his health status.  [name] [name] will continue to make healthy lifestyle choices and see continued improvement in his health status. Short term goal: [name] [name] will continue to work with his medical team to gain better control of his blood pressure.  Short - [name] [name] wil continue to exercise in class and will avoid outside work.  Long - [name] [name] will maintain exercise and good control of BP and BG.        Core Components/Risk Factors/Patient Goals at Discharge (Final Review):      Goals and Risk Factor Review - 12/21/16 1223      Core Components/Risk Factors/Patient Goals Review   Personal Goals Review Weight Management/Obesity;Diabetes;Hypertension;Improve shortness of breath with ADL's   Review [name] [name] states his weight was up 3 lb.  today but he is not short of breath.  He did eat out last night.  His BP has been good since med changes and BG have been between 112-115.  He has not been exercising at home but has been pressure washing outside against his Dr advice.  I suggested he  ask for help and follow Dr advice as being outside in addition to the task can be too hard on his heart valve.  He is having an ultrasound done soon to check the valve.   Expected Outcomes Short - [name] [name] wil continue to exercise in class and will avoid outside work.  Long - [name] [name] will maintain exercise and good control of BP and BG.      ITP Comments:     ITP Comments    Row Name 10/02/16 1357 10/11/16 0551 10/17/16 1329 11/08/16 0640 11/14/16 0924   ITP Comments ITP Created after Cardiac Rehab informed consent signed during the Medical Review.  30 day review. Continue with ITP unless directed changes per Medical Director review.  New start to program Out since last review due to knee pain.  He was cleared to return by orthopedics today. 30 day review. Continue with ITP unless directed changes per Medical Director review    [name] [name] called to let us know that he was going to miss class this morning.  He said that he was having issues with his blood pressure running high.   [place] Name 12/06/16 [id number] 01/03/17 1107         ITP Comments 30 day review. Continue with ITP unless directed changes per Medical Director  review  30 day review. Continue with ITP unless directed changes per Medical Director review.           Comments: Discharge ITP

## 2017-01-10 DIAGNOSIS — R0602 Shortness of breath: Secondary | ICD-10-CM | POA: Diagnosis not present

## 2017-01-11 ENCOUNTER — Ambulatory Visit: Payer: PPO

## 2017-01-15 DIAGNOSIS — B351 Tinea unguium: Secondary | ICD-10-CM | POA: Diagnosis not present

## 2017-01-15 DIAGNOSIS — E1142 Type 2 diabetes mellitus with diabetic polyneuropathy: Secondary | ICD-10-CM | POA: Diagnosis not present

## 2017-01-15 DIAGNOSIS — L851 Acquired keratosis [keratoderma] palmaris et plantaris: Secondary | ICD-10-CM | POA: Diagnosis not present

## 2017-01-16 ENCOUNTER — Ambulatory Visit: Payer: PPO

## 2017-01-18 ENCOUNTER — Ambulatory Visit: Payer: PPO

## 2017-01-22 DIAGNOSIS — J449 Chronic obstructive pulmonary disease, unspecified: Secondary | ICD-10-CM | POA: Diagnosis not present

## 2017-01-23 ENCOUNTER — Ambulatory Visit: Payer: PPO

## 2017-01-25 ENCOUNTER — Ambulatory Visit: Payer: PPO

## 2017-01-29 DIAGNOSIS — J449 Chronic obstructive pulmonary disease, unspecified: Secondary | ICD-10-CM | POA: Diagnosis not present

## 2017-01-30 ENCOUNTER — Ambulatory Visit: Payer: PPO

## 2017-02-01 ENCOUNTER — Ambulatory Visit: Payer: PPO

## 2017-02-05 DIAGNOSIS — I420 Dilated cardiomyopathy: Secondary | ICD-10-CM | POA: Diagnosis not present

## 2017-02-05 DIAGNOSIS — R079 Chest pain, unspecified: Secondary | ICD-10-CM | POA: Diagnosis not present

## 2017-02-05 DIAGNOSIS — E782 Mixed hyperlipidemia: Secondary | ICD-10-CM | POA: Diagnosis not present

## 2017-02-05 DIAGNOSIS — I509 Heart failure, unspecified: Secondary | ICD-10-CM | POA: Diagnosis not present

## 2017-02-05 DIAGNOSIS — G4739 Other sleep apnea: Secondary | ICD-10-CM | POA: Diagnosis not present

## 2017-02-05 DIAGNOSIS — R0602 Shortness of breath: Secondary | ICD-10-CM | POA: Diagnosis not present

## 2017-02-05 DIAGNOSIS — I2510 Atherosclerotic heart disease of native coronary artery without angina pectoris: Secondary | ICD-10-CM | POA: Diagnosis not present

## 2017-02-05 DIAGNOSIS — R609 Edema, unspecified: Secondary | ICD-10-CM | POA: Diagnosis not present

## 2017-02-05 DIAGNOSIS — I10 Essential (primary) hypertension: Secondary | ICD-10-CM | POA: Diagnosis not present

## 2017-02-06 ENCOUNTER — Ambulatory Visit: Payer: PPO

## 2017-02-08 ENCOUNTER — Ambulatory Visit: Payer: PPO

## 2017-02-13 DIAGNOSIS — E119 Type 2 diabetes mellitus without complications: Secondary | ICD-10-CM | POA: Diagnosis not present

## 2017-02-13 DIAGNOSIS — G4739 Other sleep apnea: Secondary | ICD-10-CM | POA: Diagnosis not present

## 2017-02-13 DIAGNOSIS — Z23 Encounter for immunization: Secondary | ICD-10-CM | POA: Diagnosis not present

## 2017-02-13 DIAGNOSIS — M79606 Pain in leg, unspecified: Secondary | ICD-10-CM | POA: Diagnosis not present

## 2017-02-13 DIAGNOSIS — I10 Essential (primary) hypertension: Secondary | ICD-10-CM | POA: Diagnosis not present

## 2017-02-20 DIAGNOSIS — E1122 Type 2 diabetes mellitus with diabetic chronic kidney disease: Secondary | ICD-10-CM | POA: Diagnosis not present

## 2017-02-20 DIAGNOSIS — N050 Unspecified nephritic syndrome with minor glomerular abnormality: Secondary | ICD-10-CM | POA: Diagnosis not present

## 2017-02-20 DIAGNOSIS — N183 Chronic kidney disease, stage 3 (moderate): Secondary | ICD-10-CM | POA: Diagnosis not present

## 2017-02-20 DIAGNOSIS — R809 Proteinuria, unspecified: Secondary | ICD-10-CM | POA: Diagnosis not present

## 2017-02-20 DIAGNOSIS — I129 Hypertensive chronic kidney disease with stage 1 through stage 4 chronic kidney disease, or unspecified chronic kidney disease: Secondary | ICD-10-CM | POA: Diagnosis not present

## 2017-02-20 DIAGNOSIS — N184 Chronic kidney disease, stage 4 (severe): Secondary | ICD-10-CM | POA: Diagnosis not present

## 2017-02-20 DIAGNOSIS — N055 Unspecified nephritic syndrome with diffuse mesangiocapillary glomerulonephritis: Secondary | ICD-10-CM | POA: Diagnosis not present

## 2017-02-22 DIAGNOSIS — J449 Chronic obstructive pulmonary disease, unspecified: Secondary | ICD-10-CM | POA: Diagnosis not present

## 2017-02-26 DIAGNOSIS — E119 Type 2 diabetes mellitus without complications: Secondary | ICD-10-CM | POA: Diagnosis not present

## 2017-02-26 DIAGNOSIS — G4739 Other sleep apnea: Secondary | ICD-10-CM | POA: Diagnosis not present

## 2017-02-26 DIAGNOSIS — R29898 Other symptoms and signs involving the musculoskeletal system: Secondary | ICD-10-CM | POA: Diagnosis not present

## 2017-02-26 DIAGNOSIS — E782 Mixed hyperlipidemia: Secondary | ICD-10-CM | POA: Diagnosis not present

## 2017-02-26 DIAGNOSIS — I10 Essential (primary) hypertension: Secondary | ICD-10-CM | POA: Diagnosis not present

## 2017-03-01 ENCOUNTER — Other Ambulatory Visit: Payer: Self-pay | Admitting: Internal Medicine

## 2017-03-01 DIAGNOSIS — M79604 Pain in right leg: Secondary | ICD-10-CM

## 2017-03-01 DIAGNOSIS — M79605 Pain in left leg: Principal | ICD-10-CM

## 2017-03-01 DIAGNOSIS — J449 Chronic obstructive pulmonary disease, unspecified: Secondary | ICD-10-CM | POA: Diagnosis not present

## 2017-03-02 ENCOUNTER — Other Ambulatory Visit: Payer: Self-pay | Admitting: Internal Medicine

## 2017-03-02 DIAGNOSIS — M79604 Pain in right leg: Secondary | ICD-10-CM

## 2017-03-02 DIAGNOSIS — R2243 Localized swelling, mass and lump, lower limb, bilateral: Secondary | ICD-10-CM

## 2017-03-02 DIAGNOSIS — M79605 Pain in left leg: Secondary | ICD-10-CM

## 2017-03-05 ENCOUNTER — Other Ambulatory Visit: Payer: Self-pay | Admitting: Internal Medicine

## 2017-03-05 DIAGNOSIS — M79604 Pain in right leg: Secondary | ICD-10-CM

## 2017-03-05 DIAGNOSIS — M79605 Pain in left leg: Secondary | ICD-10-CM

## 2017-03-05 DIAGNOSIS — R2243 Localized swelling, mass and lump, lower limb, bilateral: Secondary | ICD-10-CM

## 2017-03-06 ENCOUNTER — Ambulatory Visit
Admission: RE | Admit: 2017-03-06 | Discharge: 2017-03-06 | Disposition: A | Discharge status: Home / Self Care | Payer: PPO | Source: Ambulatory Visit | Attending: Internal Medicine | Admitting: Internal Medicine

## 2017-03-06 ENCOUNTER — Ambulatory Visit: Payer: PPO

## 2017-03-06 ENCOUNTER — Other Ambulatory Visit: Payer: Self-pay | Admitting: Internal Medicine

## 2017-03-06 DIAGNOSIS — R2243 Localized swelling, mass and lump, lower limb, bilateral: Secondary | ICD-10-CM | POA: Insufficient documentation

## 2017-03-06 DIAGNOSIS — M79605 Pain in left leg: Secondary | ICD-10-CM

## 2017-03-06 DIAGNOSIS — I739 Peripheral vascular disease, unspecified: Secondary | ICD-10-CM

## 2017-03-06 DIAGNOSIS — I2510 Atherosclerotic heart disease of native coronary artery without angina pectoris: Secondary | ICD-10-CM

## 2017-03-06 DIAGNOSIS — M79604 Pain in right leg: Secondary | ICD-10-CM

## 2017-03-06 DIAGNOSIS — R600 Localized edema: Secondary | ICD-10-CM | POA: Diagnosis not present

## 2017-03-06 DIAGNOSIS — R252 Cramp and spasm: Secondary | ICD-10-CM

## 2017-03-07 ENCOUNTER — Ambulatory Visit
Admission: RE | Admit: 2017-03-07 | Discharge: 2017-03-07 | Disposition: A | Discharge status: Home / Self Care | Payer: PPO | Source: Ambulatory Visit | Attending: Internal Medicine | Admitting: Internal Medicine

## 2017-03-07 ENCOUNTER — Other Ambulatory Visit: Payer: PPO

## 2017-03-07 DIAGNOSIS — R252 Cramp and spasm: Secondary | ICD-10-CM | POA: Diagnosis not present

## 2017-03-07 DIAGNOSIS — M7989 Other specified soft tissue disorders: Secondary | ICD-10-CM | POA: Diagnosis not present

## 2017-03-07 DIAGNOSIS — I2510 Atherosclerotic heart disease of native coronary artery without angina pectoris: Secondary | ICD-10-CM | POA: Insufficient documentation

## 2017-03-07 DIAGNOSIS — I739 Peripheral vascular disease, unspecified: Secondary | ICD-10-CM

## 2017-03-07 DIAGNOSIS — M79605 Pain in left leg: Secondary | ICD-10-CM | POA: Diagnosis not present

## 2017-03-07 DIAGNOSIS — E119 Type 2 diabetes mellitus without complications: Secondary | ICD-10-CM | POA: Insufficient documentation

## 2017-03-07 DIAGNOSIS — M79604 Pain in right leg: Secondary | ICD-10-CM | POA: Diagnosis not present

## 2017-03-12 DIAGNOSIS — I10 Essential (primary) hypertension: Secondary | ICD-10-CM | POA: Diagnosis not present

## 2017-03-12 DIAGNOSIS — G4739 Other sleep apnea: Secondary | ICD-10-CM | POA: Diagnosis not present

## 2017-03-12 DIAGNOSIS — E119 Type 2 diabetes mellitus without complications: Secondary | ICD-10-CM | POA: Diagnosis not present

## 2017-03-12 DIAGNOSIS — E782 Mixed hyperlipidemia: Secondary | ICD-10-CM | POA: Diagnosis not present

## 2017-03-24 DIAGNOSIS — J449 Chronic obstructive pulmonary disease, unspecified: Secondary | ICD-10-CM | POA: Diagnosis not present

## 2017-03-31 DIAGNOSIS — J449 Chronic obstructive pulmonary disease, unspecified: Secondary | ICD-10-CM | POA: Diagnosis not present

## 2017-04-05 ENCOUNTER — Ambulatory Visit: Payer: Self-pay | Admitting: Internal Medicine

## 2017-04-10 DIAGNOSIS — I2510 Atherosclerotic heart disease of native coronary artery without angina pectoris: Secondary | ICD-10-CM | POA: Diagnosis not present

## 2017-04-10 DIAGNOSIS — E782 Mixed hyperlipidemia: Secondary | ICD-10-CM | POA: Diagnosis not present

## 2017-04-10 DIAGNOSIS — G4739 Other sleep apnea: Secondary | ICD-10-CM | POA: Diagnosis not present

## 2017-04-10 DIAGNOSIS — I10 Essential (primary) hypertension: Secondary | ICD-10-CM | POA: Diagnosis not present

## 2017-04-10 DIAGNOSIS — E119 Type 2 diabetes mellitus without complications: Secondary | ICD-10-CM | POA: Diagnosis not present

## 2017-05-09 ENCOUNTER — Ambulatory Visit (INDEPENDENT_AMBULATORY_CARE_PROVIDER_SITE_OTHER): Payer: Medicare HMO | Admitting: Internal Medicine

## 2017-05-09 DIAGNOSIS — G4733 Obstructive sleep apnea (adult) (pediatric): Secondary | ICD-10-CM

## 2017-05-14 ENCOUNTER — Encounter: Payer: Self-pay | Admitting: Internal Medicine

## 2017-05-14 NOTE — Patient Instructions (Signed)

## 2017-05-14 NOTE — Progress Notes (Signed)
[facility] [place], [place] [address]  Sleep Specialist: Allyne Gee, MD [place] Sleep Study Interpretation  Patient Name: [name] [name] [facility] Patient MR RSWNIO:[id number] DOB:1945-06-06  Indications for study:  BMI: 32.81     Excessive Daytime sleepiness, Snoring  Respiratory Data:  Total Respiratory Disturbance Index: 32.5  Total Obstructive Apneas:  8  Total Hypopneas:  220  Total Central Apneas:  0  If the AHI is greater than 5 per hour patient qualifies for PAP evaluation  Oximetry Data:  Oxygen Desaturation Index: 39.1  Lowest Desaturation:  76%  Cardiac Data:  Minimum Heart Rate:  51  Maximum Heart Rate:  102   Impression / Diagnosis:   this home polysomnogram demonstrates 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  Patient has severe oxygen desaturations.  Majority of respiratory events were obstructive.  Patient should have a CPAP titration study done to evaluate response to positive airway pressure  Recommendations: 1.   Would recommend a CPAP titration study secondary to the high level of apnea-hypopnea index.  Patient also has underlying coronary artery disease and congestive heart failure and COPD.    2.  Consider PAP interface mask fitted for patient comfort, Heated Humidification & PAP compliance monitoring (1 month, 3 months & 12 months after PAP initiation)  3. Consider treatment with mandibular advancement splint (MAS) or referral to an ENT surgeon for modification to the upper airway if the patient prefers an alternate therapy or the PAP trial is unsuccessful  4. Consider sleep hygiene measures  5. Consider behavioral therapy such as weight reduction or smoking cessation as appropriate for this patient  6. Consider advising patient against the use of alcohol or sedatives in so much as these substances can  worsen excessive daytime sleepiness and respiratory disturbances of sleep  7. Consider advising patient against participating in potentially dangerous activities while drowsy such as operating a motor vehicle, heavy equipment or power tools as it can put them and others in danger  8. Consider advising patient of the long term consequences of OSA if left untreated, need for treatment and close follow up  9. Clinical follow up as deemed necessary     This Level III home sleep study was performed using the [employer], a 4 channel screening device subject to limitations. Depending on actual total sleep time, not measured in this study, the AHI (sum of apneas and hypopneas/hr of sleep) and therefore the severity of sleep apnea may be underestimated. As with any single night study, including Level 1 attended PSG, severity of sleep apnea may also be underestimated due to the lack of supine and/or REM sleep.  The interpretation associated with this report is based on normal values and degrees of severity in accordance with AASM parameters and/or estimated from multiple sources in the literature for adults ages 73-80+. These may not agree with the displayed values. The patient's treating physician should use the interpretation and recommendations in conjunction with the overall clinical evaluation and treatment of the patient.  Some of the terminology used in this scored [facility] report was developed several years ago and may not always be in accordance with current nomenclature. This in no way affects the accuracy of the data or the reliability of the interpretation and recommendations.

## 2017-07-15 IMAGING — US US RENAL
1 series · 14 of 25 positions shown · non-contrast
Comparison: 07/28/2014 and prior CTs

CLINICAL DATA: 70-year-old male with acute renal failure.

EXAM:
RENAL / URINARY TRACT ULTRASOUND COMPLETE

[Series 1: us renal · 0.25mm/px · 14 of 43 slices shown]
[im 1/43]
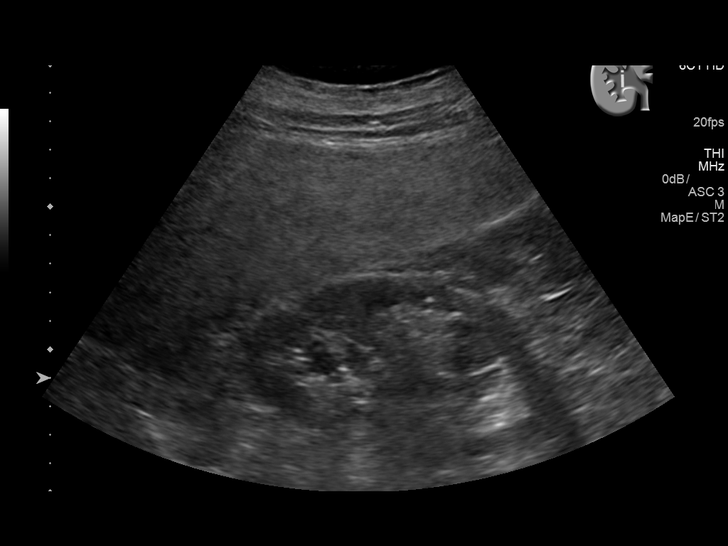
[im 4/43]
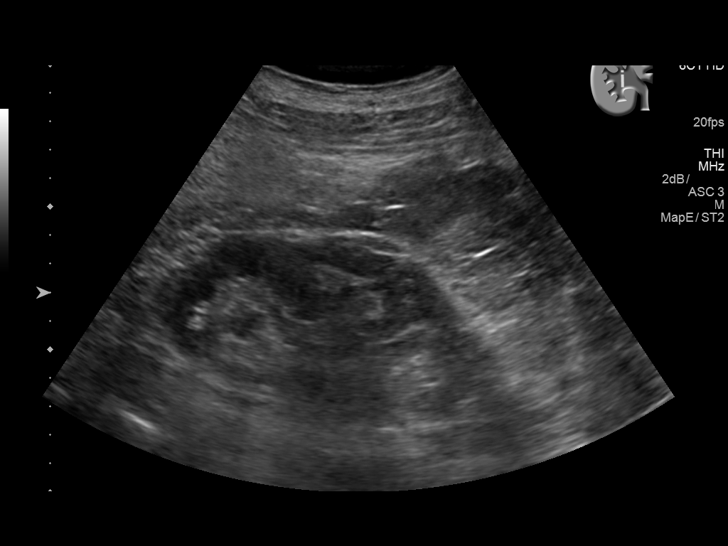
[im 8/43]
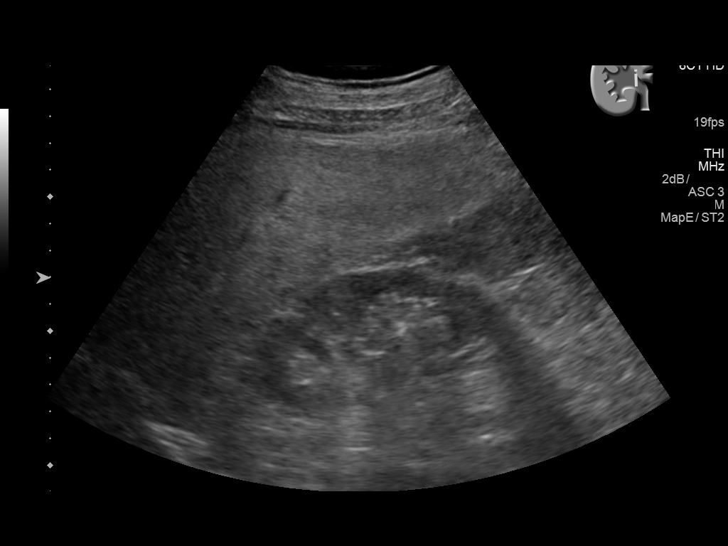
[im 11/43]
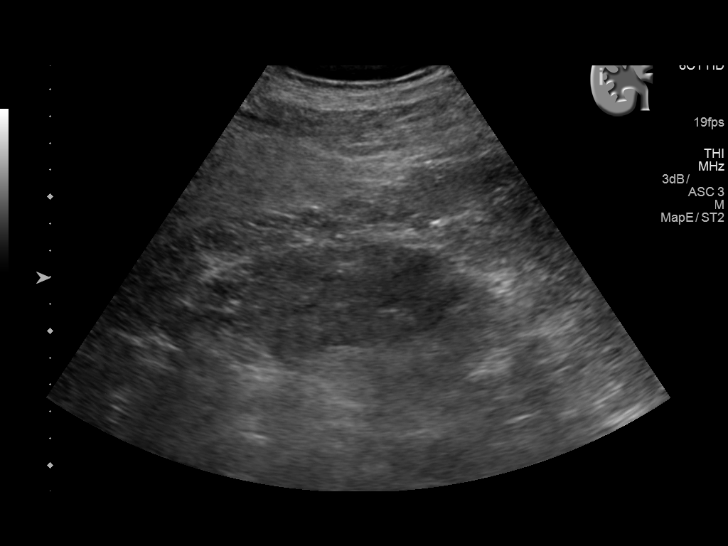
[im 15/43]
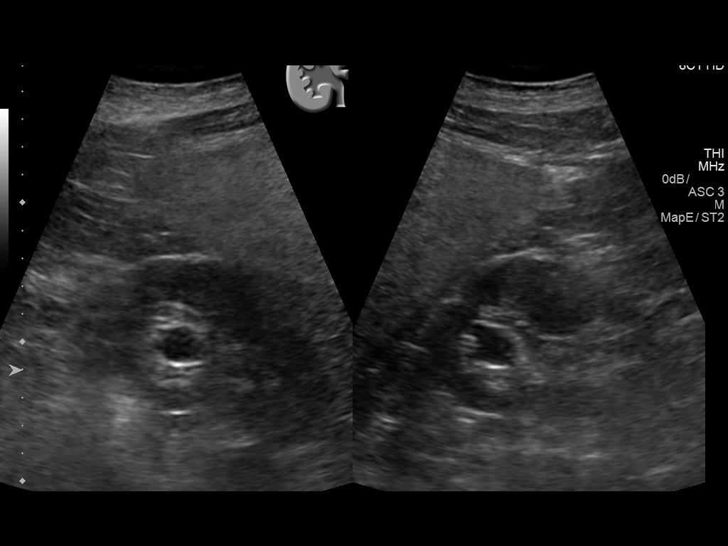
[im 16/43]
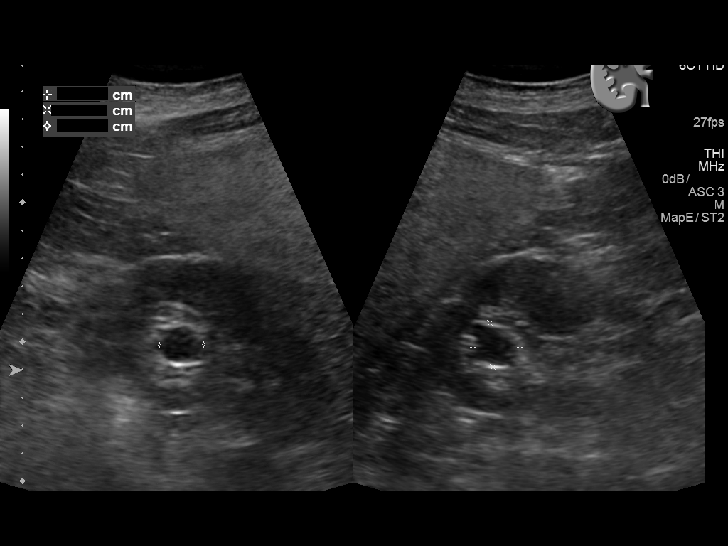
[im 20/43]
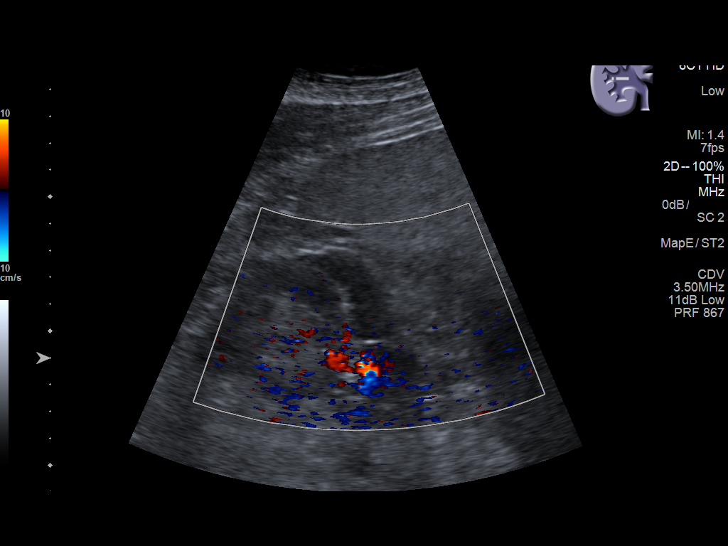
[im 23/43]
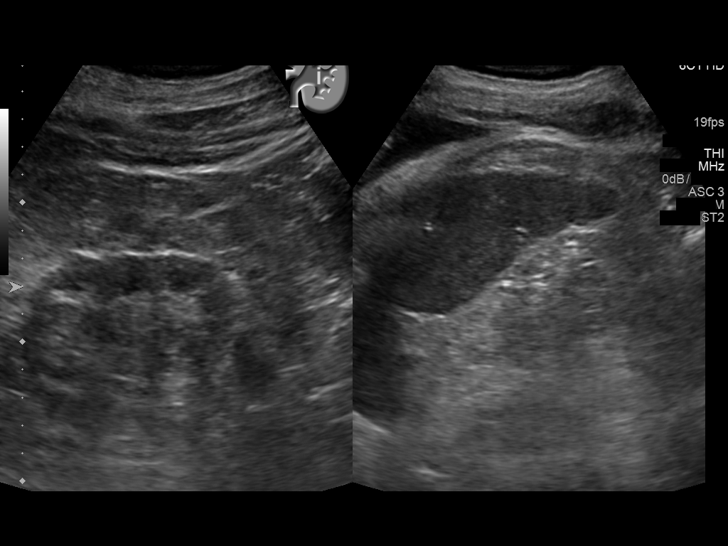
[im 27/43]
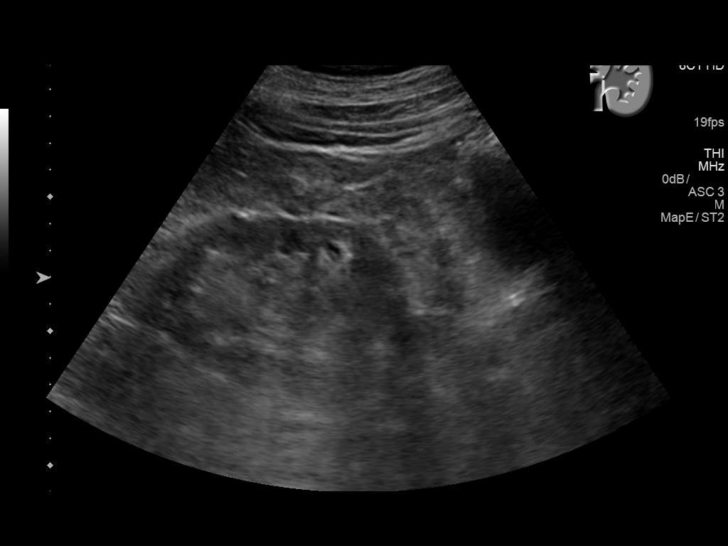
[im 29/43]
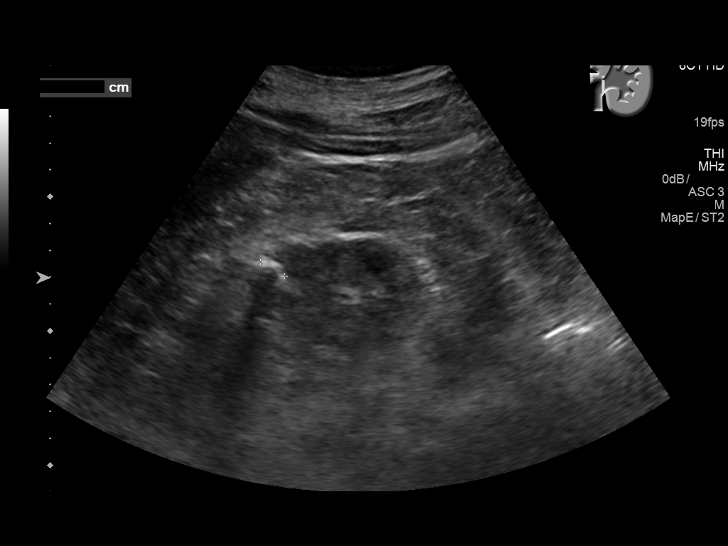
[im 32/43]
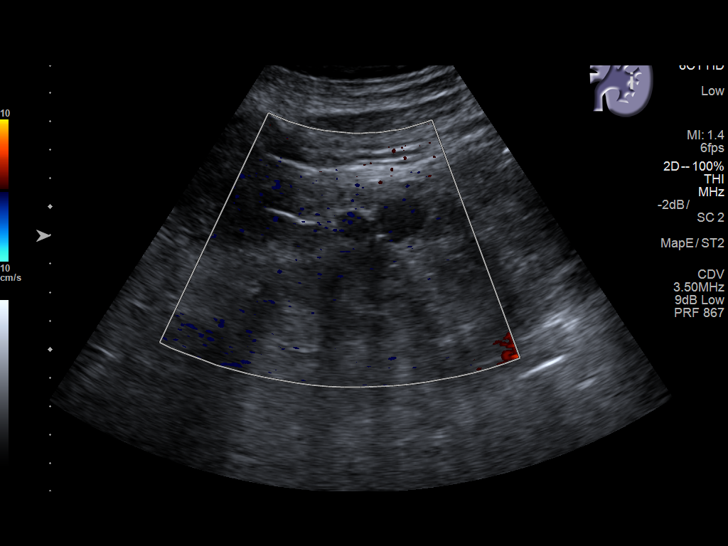
[im 36/43]
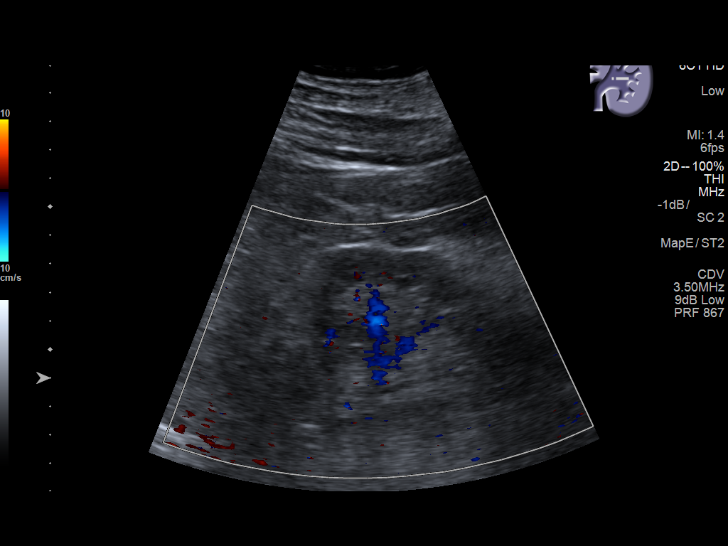
[im 39/43]
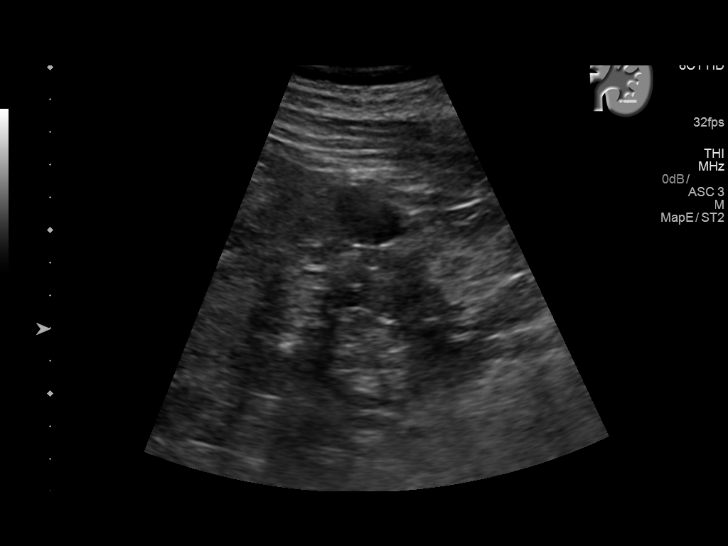
[im 43/43]
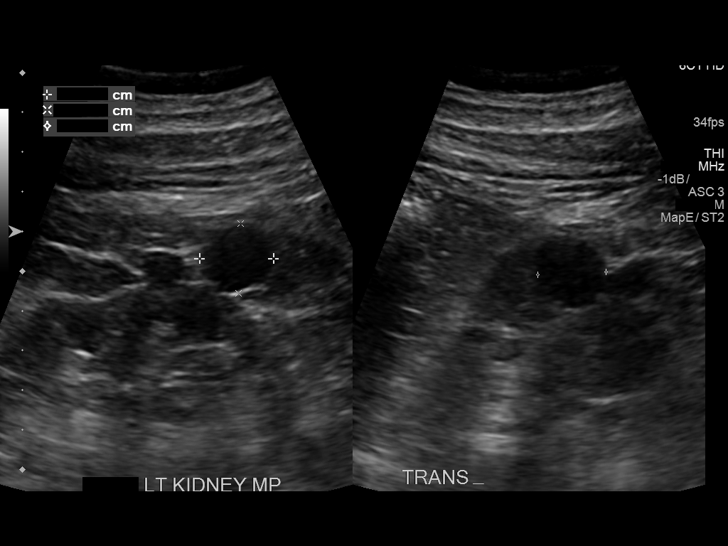

[14 of 25 positions shown; findings below may reference images not displayed]

FINDINGS: Right Kidney:

Length: 11.8 cm. Cortical thinning noted with normal echogenicity. A
1.7 cm upper pole cyst is again noted. There is no evidence of
hydronephrosis or solid mass.

Left Kidney:

Length: 11.1 cm. Cortical thinning noted with normal echogenicity.
Left renal cysts are present. There is no evidence of hydronephrosis
or solid mass.

Bladder:

Not well visualized.

Incidental note is made of a left pleural effusion - at least small
to moderate in size.
IMPRESSION: Bilateral renal cortical thinning/ atrophy with normal renal
echogenicity. No evidence of hydronephrosis.

Bladder not well visualized.

Left pleural effusion - at least small to moderate in size.

## 2017-07-20 ENCOUNTER — Telehealth: Payer: Self-pay

## 2017-07-20 NOTE — Telephone Encounter (Signed)
CMN is ready and put in folder.

## 2017-10-01 ENCOUNTER — Ambulatory Visit: Payer: Medicare HMO | Admitting: Internal Medicine

## 2017-10-01 ENCOUNTER — Encounter: Payer: Self-pay | Admitting: Internal Medicine

## 2017-10-01 VITALS — BP 140/64 | HR 79 | Resp 16 | Ht 64.0 in | Wt 225.6 lb

## 2017-10-01 DIAGNOSIS — J9601 Acute respiratory failure with hypoxia: Secondary | ICD-10-CM | POA: Diagnosis not present

## 2017-10-01 DIAGNOSIS — G2581 Restless legs syndrome: Secondary | ICD-10-CM | POA: Diagnosis not present

## 2017-10-01 DIAGNOSIS — J441 Chronic obstructive pulmonary disease with (acute) exacerbation: Secondary | ICD-10-CM

## 2017-10-01 DIAGNOSIS — R5383 Other fatigue: Secondary | ICD-10-CM

## 2017-10-01 DIAGNOSIS — R0602 Shortness of breath: Secondary | ICD-10-CM | POA: Diagnosis not present

## 2017-10-01 DIAGNOSIS — G4733 Obstructive sleep apnea (adult) (pediatric): Secondary | ICD-10-CM

## 2017-10-01 DIAGNOSIS — Z952 Presence of prosthetic heart valve: Secondary | ICD-10-CM

## 2017-10-01 DIAGNOSIS — Z9989 Dependence on other enabling machines and devices: Secondary | ICD-10-CM

## 2017-10-01 MED ORDER — ROFLUMILAST 250 MCG PO TABS: 1.0000 | ORAL_TABLET | Freq: Every day | ORAL | 4 refills | Status: DC

## 2017-10-01 NOTE — Patient Instructions (Signed)

## 2017-10-01 NOTE — Progress Notes (Signed)
[facility], [place] [address]  Pulmonary Sleep Medicine   Office Visit Note  Patient Name: [name] [name] DOB: 12/19/1945 MRN [id number]  Date of Service: 10/01/2017  Complaints/HPI: Patient is here for follow-up sleep apnea and COPD.  States he is doing okay has been having increase in shortness of breath.  Patient has noted some cough and congestion.  States he is taking his inhalers as prescribed.  He has been using his COPD nebulizers as prescribed.  Also using CPAP as prescribed.  Still working on trying to lose weight.  Patient states he is not smoking at this time.  ROS  General: (-) fever, (-) chills, (-) night sweats, (-) weakness Skin: (-) rashes, (-) itching,. Eyes: (-) visual changes, (-) redness, (-) itching. Nose and Sinuses: (-) nasal stuffiness or itchiness, (-) postnasal drip, (-) nosebleeds, (-) sinus trouble. Mouth and Throat: (-) sore throat, (-) hoarseness. Neck: (-) swollen glands, (-) enlarged thyroid, (-) neck pain. Respiratory: - cough, (-) bloody sputum, + shortness of breath, - wheezing. Cardiovascular: - ankle swelling, (-) chest pain. Lymphatic: (-) lymph node enlargement. Neurologic: (-) numbness, (-) tingling. Psychiatric: (-) anxiety, (-) depression   Current Medication: Outpatient Encounter Medications as of 10/01/2017  Medication Sig  . acetaminophen (TYLENOL) 325 MG tablet Take 2 tablets (650 mg total) by mouth every 4 (four) hours as needed for mild pain (temp > 101.5).  Marland Kitchen Ascorbic Acid (VITAMIN C) 1000 MG tablet Take 1,000 mg by mouth daily.  Marland Kitchen aspirin EC 81 MG EC tablet Take 1 tablet (81 mg total) by mouth daily.  . citalopram (CELEXA) 20 MG tablet Take 1 tablet (20 mg total) by mouth daily.  . clopidogrel (PLAVIX) 75 MG tablet Take 75 mg by mouth daily.  . fenofibrate 160 MG tablet Take 160 mg by mouth daily.  . fluticasone (FLONASE) 50 MCG/ACT nasal spray Place 2 sprays into both nostrils daily.   Marland Kitchen gabapentin (NEURONTIN) 100 MG capsule Take 2 capsules (200 mg total) by mouth 2 (two) times daily.  . heparin 5000 UNIT/ML injection Inject 1 mL (5,000 Units total) into the skin every 8 (eight) hours.  . hydrALAZINE (APRESOLINE) 50 MG tablet Take 50 mg by mouth 2 (two) times daily.  Marland Kitchen ipratropium-albuterol (DUONEB) 0.5-2.5 (3) MG/3ML SOLN Take 3 mLs by nebulization every 6 (six) hours.  . methocarbamol (ROBAXIN) 750 MG tablet Take 750 mg by mouth 4 (four) times daily.  . metoprolol succinate (TOPROL-XL) 50 MG 24 hr tablet Take 50 mg by mouth daily. Take with or immediately following a meal.  . pantoprazole (PROTONIX) 40 MG tablet Take 40 mg by mouth daily.  . Potassium 95 MG TABS Take by mouth.  . sodium chloride (OCEAN) 0.65 % SOLN nasal spray Place 1 spray into both nostrils as needed for congestion.  . tamsulosin (FLOMAX) 0.4 MG CAPS capsule Take 1 capsule (0.4 mg total) by mouth daily.  Marland Kitchen torsemide (DEMADEX) 20 MG tablet Take 20 mg by mouth daily.  . budesonide (PULMICORT) 0.5 MG/2ML nebulizer solution Take 2 mLs (0.5 mg total) by nebulization 2 (two) times daily. (Patient not taking: Reported on 09/05/2016)  . famotidine (PEPCID) 20 MG tablet Take 1 tablet (20 mg total) by mouth at bedtime. (Patient not taking: Reported on 09/05/2016)  . furosemide (LASIX) 40 MG tablet Take 1 tablet (40 mg total) by mouth 2 (two) times daily. (Patient not taking: Reported on 09/05/2016)  . insulin aspart (NOVOLOG) 100 UNIT/ML injection Inject 0-9 Units  into the skin every 4 (four) hours. (Patient not taking: Reported on 09/05/2016)   No facility-administered encounter medications on file as of 10/01/2017.     Surgical History: Past Surgical History:  Procedure Laterality Date  . HERNIA REPAIR    . LITHOTRIPSY    . SUPRAVALVULAR AORTIC STENOSIS REPAIR      Medical History: Past Medical History:  Diagnosis Date  . CHF (congestive heart failure) ([place])   . COPD (chronic obstructive pulmonary disease)  ([place])   . Diabetes mellitus without complication ([place])   . Hypertension   . Kidney stones   . Renal disorder     Family History: Family History  Adopted: Yes    Social History: Social History   Socioeconomic History  . Marital status: Married    Spouse name: Not on file  . Number of children: Not on file  . Years of education: Not on file  . Highest education level: Not on file  Occupational History  . Not on file  Social Needs  . Financial resource strain: Not on file  . Food insecurity:    Worry: Not on file    Inability: Not on file  . Transportation needs:    Medical: Not on file    Non-medical: Not on file  Tobacco Use  . Smoking status: Former Research scientist (life sciences)  . Smokeless tobacco: Former Systems developer    Quit date: 05/16/2004  Substance and Sexual Activity  . Alcohol use: No  . Drug use: Not on file  . Sexual activity: Not on file  Lifestyle  . Physical activity:    Days per week: Not on file    Minutes per session: Not on file  . Stress: Not on file  Relationships  . Social connections:    Talks on phone: Not on file    Gets together: Not on file    Attends religious service: Not on file    Active member of club or organization: Not on file    Attends meetings of clubs or organizations: Not on file    Relationship status: Not on file  . Intimate partner violence:    Fear of current or ex partner: Not on file    Emotionally abused: Not on file    Physically abused: Not on file    Forced sexual activity: Not on file  Other Topics Concern  . Not on file  Social History Narrative  . Not on file    Vital Signs: Blood pressure 140/64, pulse 79, resp. rate 16, height 5\' 4"  (1.626 m), weight 225 lb 9.6 oz (102.3 kg), SpO2 91 %.  Examination: General Appearance: The patient is well-developed, well-nourished, and in no distress. Skin: Gross inspection of skin unremarkable. Head: normocephalic, no gross deformities. Eyes: no gross deformities noted. ENT: ears appear  grossly normal no exudates. Neck: Supple. No thyromegaly. No LAD. Respiratory: few rhonchi noted. Cardiovascular: Normal S1 and S2 without murmur or rub. Extremities: No cyanosis. pulses are equal. Neurologic: Alert and oriented. No involuntary movements.  LABS: No results found for this or any previous visit (from the past 2160 hour(s)).  Radiology: US Arterial Lower Extremity Duplex Bilateral  Result Date: 03/09/2017 CLINICAL DATA:  Bilateral leg pain.  Coronary artery disease. EXAM: BILATERAL LOWER EXTREMITY ARTERIAL DUPLEX SCAN TECHNIQUE: Gray-scale sonography as well as color Doppler and duplex ultrasound was performed to evaluate the arteries of both lower extremities including the common, superficial and profunda femoral arteries, popliteal artery and calf arteries. COMPARISON:  CT 05/31/2011 FINDINGS:  Right Lower Extremity ABI: Not measured Inflow: Normal common femoral arterial waveforms and velocities. No suggestion of significant inflow (aortoiliac) disease. Outflow: Normal profunda femoral, superficial femoral and popliteal arterial waveforms and velocities. No focal elevation of the PSV to suggest stenosis. Runoff: Normal posterior and anterior tibial arterial waveforms and velocities. No stenosis or segmental occlusion on survey interrogation. Left Lower Extremity ABI: Not measured Inflow: Normal common femoral arterial waveforms and velocities. No suggestion of significant inflow (aortoiliac) disease. Outflow: Normal profunda femoral, superficial femoral and popliteal arterial waveforms and velocities. No focal elevation of the PSV to suggest stenosis. Runoff: Normal posterior and anterior tibial arterial waveforms and velocities. No stenosis or segmental occlusion on survey interrogation. IMPRESSION: 1. Unremarkable . Consider confirmation with pre and postexercise ABIs if symptoms persist. Electronically Signed   By: Lucrezia Europe M.D.   On: 03/09/2017 09:08    No results found.  No  results found.    Assessment and Plan: Patient Active Problem List   Diagnosis Date Noted  . Acute renal failure (ARF) ([place])   . COPD exacerbation ([place])   . Respiratory failure ([place]) 07/05/2016    1. SOB/COPD Moderate severity. Currently is on symbicort and spiriva. Feels that his symptoms are a bit worse now. Patient may benefit from daliresp will prescribe. 2. ARF doing well no issues at this time 3. CHF follows with cardiology. Patient has EF of 55% 4. Aortic Valve replacement has been doing well. Follows with cardiology. Need to be careful to make certain the heart remains a non-issues with his breathing 5. OSA on CPAP doing well 6. RLS will continue with supportive care. Also has been having fatigue will check iron levels  General Counseling: I have discussed the findings of the evaluation and examination with Thayer Jew.  I have also discussed any further diagnostic evaluation thatmay be needed or ordered today. Yordy verbalizes understanding of the findings of todays visit. We also reviewed his medications today and discussed drug interactions and side effects including but not limited excessive drowsiness and altered mental states. We also discussed that there is always a risk not just to him but also people around him. he has been encouraged to call the office with any questions or concerns that should arise related to todays visit.    Time spent: 59min  I have personally obtained a history, examined the patient, evaluated laboratory and imaging results, formulated the assessment and plan and placed orders.    Allyne Gee, MD [facility] Pulmonary and Critical Care Sleep medicine

## 2017-10-10 ENCOUNTER — Ambulatory Visit: Payer: Self-pay | Admitting: Internal Medicine

## 2017-10-22 ENCOUNTER — Telehealth: Payer: Self-pay | Admitting: Internal Medicine

## 2017-10-22 NOTE — Telephone Encounter (Signed)
SPOKE WITH MR [place] WIFE SHE STATES THAT THE MEDICATION DALIRESP HE STOPPED TAKING 3 DAYS AGO IT WAS GIVING HIM AN UPSET STOMACH AND DIARRHEA. HE DOES HAVE A FOLLOW UP ON 11/08/17 CAN TALK ABOUT IT AT THAT VISIT

## 2017-11-08 ENCOUNTER — Ambulatory Visit: Payer: Medicare HMO | Admitting: Internal Medicine

## 2017-11-08 ENCOUNTER — Encounter: Payer: Self-pay | Admitting: Internal Medicine

## 2017-11-08 VITALS — BP 110/78 | HR 72 | Resp 18 | Ht 68.0 in | Wt 224.2 lb

## 2017-11-08 DIAGNOSIS — R0602 Shortness of breath: Secondary | ICD-10-CM | POA: Diagnosis not present

## 2017-11-08 DIAGNOSIS — J441 Chronic obstructive pulmonary disease with (acute) exacerbation: Secondary | ICD-10-CM

## 2017-11-08 DIAGNOSIS — G4733 Obstructive sleep apnea (adult) (pediatric): Secondary | ICD-10-CM | POA: Diagnosis not present

## 2017-11-08 NOTE — Progress Notes (Signed)
[facility] [place],  [address]  Internal MEDICINE  Office Visit Note  Patient Name: [name] [name]  [id number]  [id number]  Date of Service: 11/08/2017  Chief Complaint  Patient presents with  . COPD    stopped taking daliresp due to diarrhea     HPI  Pt here for one month follow up.  He reports he was given Dalresp at last visit and he was unable to take it due to severe diarrhea.  He has been using his nebulizer as prescribed. He states overall he has been doing well. Last PFT 12/2016.  He is complaint with his CPAP at night which includes oxygen at 3 lpm.  He also wears oxygen continuously throughout the day at 3 lpm.    Current Medication: Outpatient Encounter Medications as of 11/08/2017  Medication Sig  . OXYGEN Inhale into the lungs continuous.  Marland Kitchen acetaminophen (TYLENOL) 325 MG tablet Take 2 tablets (650 mg total) by mouth every 4 (four) hours as needed for mild pain (temp > 101.5).  Marland Kitchen Ascorbic Acid (VITAMIN C) 1000 MG tablet Take 1,000 mg by mouth daily.  Marland Kitchen aspirin EC 81 MG EC tablet Take 1 tablet (81 mg total) by mouth daily.  . budesonide (PULMICORT) 0.5 MG/2ML nebulizer solution Take 2 mLs (0.5 mg total) by nebulization 2 (two) times daily. (Patient not taking: Reported on 09/05/2016)  . citalopram (CELEXA) 20 MG tablet Take 1 tablet (20 mg total) by mouth daily.  . clopidogrel (PLAVIX) 75 MG tablet Take 75 mg by mouth daily.  . famotidine (PEPCID) 20 MG tablet Take 1 tablet (20 mg total) by mouth at bedtime. (Patient not taking: Reported on 09/05/2016)  . fenofibrate 160 MG tablet Take 160 mg by mouth daily.  . fluticasone (FLONASE) 50 MCG/ACT nasal spray Place 2 sprays into both nostrils daily.  . furosemide (LASIX) 40 MG tablet Take 1 tablet (40 mg total) by mouth 2 (two) times daily. (Patient not taking: Reported on 09/05/2016)  . gabapentin (NEURONTIN) 100 MG capsule Take 2 capsules (200 mg total) by mouth 2 (two) times daily.   . heparin 5000 UNIT/ML injection Inject 1 mL (5,000 Units total) into the skin every 8 (eight) hours.  . hydrALAZINE (APRESOLINE) 50 MG tablet Take 50 mg by mouth 2 (two) times daily.  . insulin aspart (NOVOLOG) 100 UNIT/ML injection Inject 0-9 Units into the skin every 4 (four) hours. (Patient not taking: Reported on 09/05/2016)  . ipratropium-albuterol (DUONEB) 0.5-2.5 (3) MG/3ML SOLN Take 3 mLs by nebulization every 6 (six) hours.  . methocarbamol (ROBAXIN) 750 MG tablet Take 750 mg by mouth 4 (four) times daily.  . metoprolol succinate (TOPROL-XL) 50 MG 24 hr tablet Take 50 mg by mouth daily. Take with or immediately following a meal.  . pantoprazole (PROTONIX) 40 MG tablet Take 40 mg by mouth daily.  . Potassium 95 MG TABS Take by mouth.  . Roflumilast (DALIRESP) 250 MCG TABS Take 1 tablet by mouth daily. (Patient not taking: Reported on 11/08/2017)  . sodium chloride (OCEAN) 0.65 % SOLN nasal spray Place 1 spray into both nostrils as needed for congestion.  . tamsulosin (FLOMAX) 0.4 MG CAPS capsule Take 1 capsule (0.4 mg total) by mouth daily.  Marland Kitchen torsemide (DEMADEX) 20 MG tablet Take 20 mg by mouth daily.   No facility-administered encounter medications on file as of 11/08/2017.     Surgical History: Past Surgical History:  Procedure Laterality Date  . HERNIA REPAIR    .  LITHOTRIPSY    . SUPRAVALVULAR AORTIC STENOSIS REPAIR      Medical History: Past Medical History:  Diagnosis Date  . CHF (congestive heart failure) ([place])   . COPD (chronic obstructive pulmonary disease) ([place])   . Diabetes mellitus without complication ([place])   . Hypertension   . Kidney stones   . Renal disorder     Family History: Family History  Adopted: Yes    Social History   Socioeconomic History  . Marital status: Married    Spouse name: Not on file  . Number of children: Not on file  . Years of education: Not on file  . Highest education level: Not on file  Occupational History  . Not on  file  Social Needs  . Financial resource strain: Not on file  . Food insecurity:    Worry: Not on file    Inability: Not on file  . Transportation needs:    Medical: Not on file    Non-medical: Not on file  Tobacco Use  . Smoking status: Former Research scientist (life sciences)  . Smokeless tobacco: Former Systems developer    Quit date: 05/16/2004  Substance and Sexual Activity  . Alcohol use: No  . Drug use: Not on file  . Sexual activity: Not on file  Lifestyle  . Physical activity:    Days per week: Not on file    Minutes per session: Not on file  . Stress: Not on file  Relationships  . Social connections:    Talks on phone: Not on file    Gets together: Not on file    Attends religious service: Not on file    Active member of club or organization: Not on file    Attends meetings of clubs or organizations: Not on file    Relationship status: Not on file  . Intimate partner violence:    Fear of current or ex partner: Not on file    Emotionally abused: Not on file    Physically abused: Not on file    Forced sexual activity: Not on file  Other Topics Concern  . Not on file  Social History Narrative  . Not on file      Review of Systems  Constitutional: Negative.  Negative for chills, fatigue and unexpected weight change.  HENT: Negative.  Negative for congestion, rhinorrhea, sneezing and sore throat.   Eyes: Negative for redness.  Respiratory: Negative.  Negative for cough, chest tightness and shortness of breath.   Cardiovascular: Negative.  Negative for chest pain and palpitations.  Gastrointestinal: Negative.  Negative for abdominal pain, constipation, diarrhea, nausea and vomiting.  Endocrine: Negative.   Genitourinary: Negative.  Negative for dysuria and frequency.  Musculoskeletal: Negative.  Negative for arthralgias, back pain, joint swelling and neck pain.  Skin: Negative.  Negative for rash.  Allergic/Immunologic: Negative.   Neurological: Negative.  Negative for tremors and numbness.   Hematological: Negative for adenopathy. Does not bruise/bleed easily.  Psychiatric/Behavioral: Negative.  Negative for behavioral problems, sleep disturbance and suicidal ideas. The patient is not nervous/anxious.     Vital Signs: BP 110/78   Pulse 72   Resp 18   Ht 5\' 8"  (1.727 m)   Wt 224 lb 3.2 oz (101.7 kg)   SpO2 90%   BMI 34.09 kg/m    Physical Exam  Constitutional: He is oriented to person, place, and time. He appears well-developed and well-nourished. No distress.  HENT:  Head: Normocephalic and atraumatic.  Mouth/Throat: Oropharynx is clear and moist. No  oropharyngeal exudate.  Eyes: Pupils are equal, round, and reactive to light. EOM are normal.  Neck: Normal range of motion. Neck supple. No JVD present. No tracheal deviation present. No thyromegaly present.  Cardiovascular: Normal rate, regular rhythm and normal heart sounds. Exam reveals no gallop and no friction rub.  No murmur heard. Pulmonary/Chest: Effort normal and breath sounds normal. No respiratory distress. He has no wheezes. He has no rales. He exhibits no tenderness.  Abdominal: Soft. There is no tenderness. There is no guarding.  Musculoskeletal: Normal range of motion.  Lymphadenopathy:    He has no cervical adenopathy.  Neurological: He is alert and oriented to person, place, and time. No cranial nerve deficit.  Skin: Skin is warm and dry. He is not diaphoretic.  Psychiatric: He has a normal mood and affect. His behavior is normal. Judgment and thought content normal.  Nursing note and vitals reviewed.   Assessment/Plan: 1. COPD exacerbation (HCC) Use nebs as directed. Continue to use home o2.   2. Obstructive sleep apnea syndrome Continue to use CPAP as directed.  Follow up in sleep clinic for download and eval.   3. SOB (shortness of breath) Continue using nebs as directed.  Avoid being outside in heat. Chronic Home O2 use continuous.   General Counseling: kol consuegra understanding of  the findings of todays visit and agrees with plan of treatment. I have discussed any further diagnostic evaluation that may be needed or ordered today. We also reviewed his medications today. he has been encouraged to call the office with any questions or concerns that should arise related to todays visit.    No orders of the defined types were placed in this encounter.   No orders of the defined types were placed in this encounter.   Time spent: 20 Minutes   This patient was seen by Orson Gear AGNP-C in Collaboration with Dr Lavera Guise as a part of collaborative care agreement    Dr Lavera Guise Internal medicine

## 2017-11-12 ENCOUNTER — Encounter: Payer: Self-pay | Admitting: Internal Medicine

## 2017-11-15 ENCOUNTER — Encounter: Payer: Self-pay | Admitting: Internal Medicine

## 2017-11-15 NOTE — Patient Instructions (Signed)

## 2017-11-21 ENCOUNTER — Ambulatory Visit (INDEPENDENT_AMBULATORY_CARE_PROVIDER_SITE_OTHER): Payer: Medicare HMO

## 2017-11-21 DIAGNOSIS — G4733 Obstructive sleep apnea (adult) (pediatric): Secondary | ICD-10-CM | POA: Diagnosis not present

## 2017-11-21 NOTE — Progress Notes (Signed)
95 percentile pressure 12.7   95th percentile leak 49.3   apnea index 0.1 /hr  apnea-hypopnea index  0.4 /hr   total days used  >4 hr 87 days  total days used <4 hr 3 days  Total compliance 97 percent  Patient doing very good no problems. Went over how to replace and clean supplies.

## 2018-01-14 ENCOUNTER — Other Ambulatory Visit: Payer: Self-pay

## 2018-01-14 MED ORDER — TAMSULOSIN HCL 0.4 MG PO CAPS: 0.4000 mg | ORAL_CAPSULE | Freq: Every day | ORAL | 0 refills | Status: DC

## 2018-04-06 IMAGING — DX DG CHEST 1V PORT
1 series · 1 of 1 positions shown · non-contrast
Comparison: Chest x-ray of February 15, 2016

CLINICAL DATA: Onset of shortness of breath last night made worse
today, on CPAP, oxygen saturations 70% on 15 L. Increased work of
breathing. History of COPD, CHF, former smoker.

EXAM:
PORTABLE CHEST 1 VIEW

[chest ap]
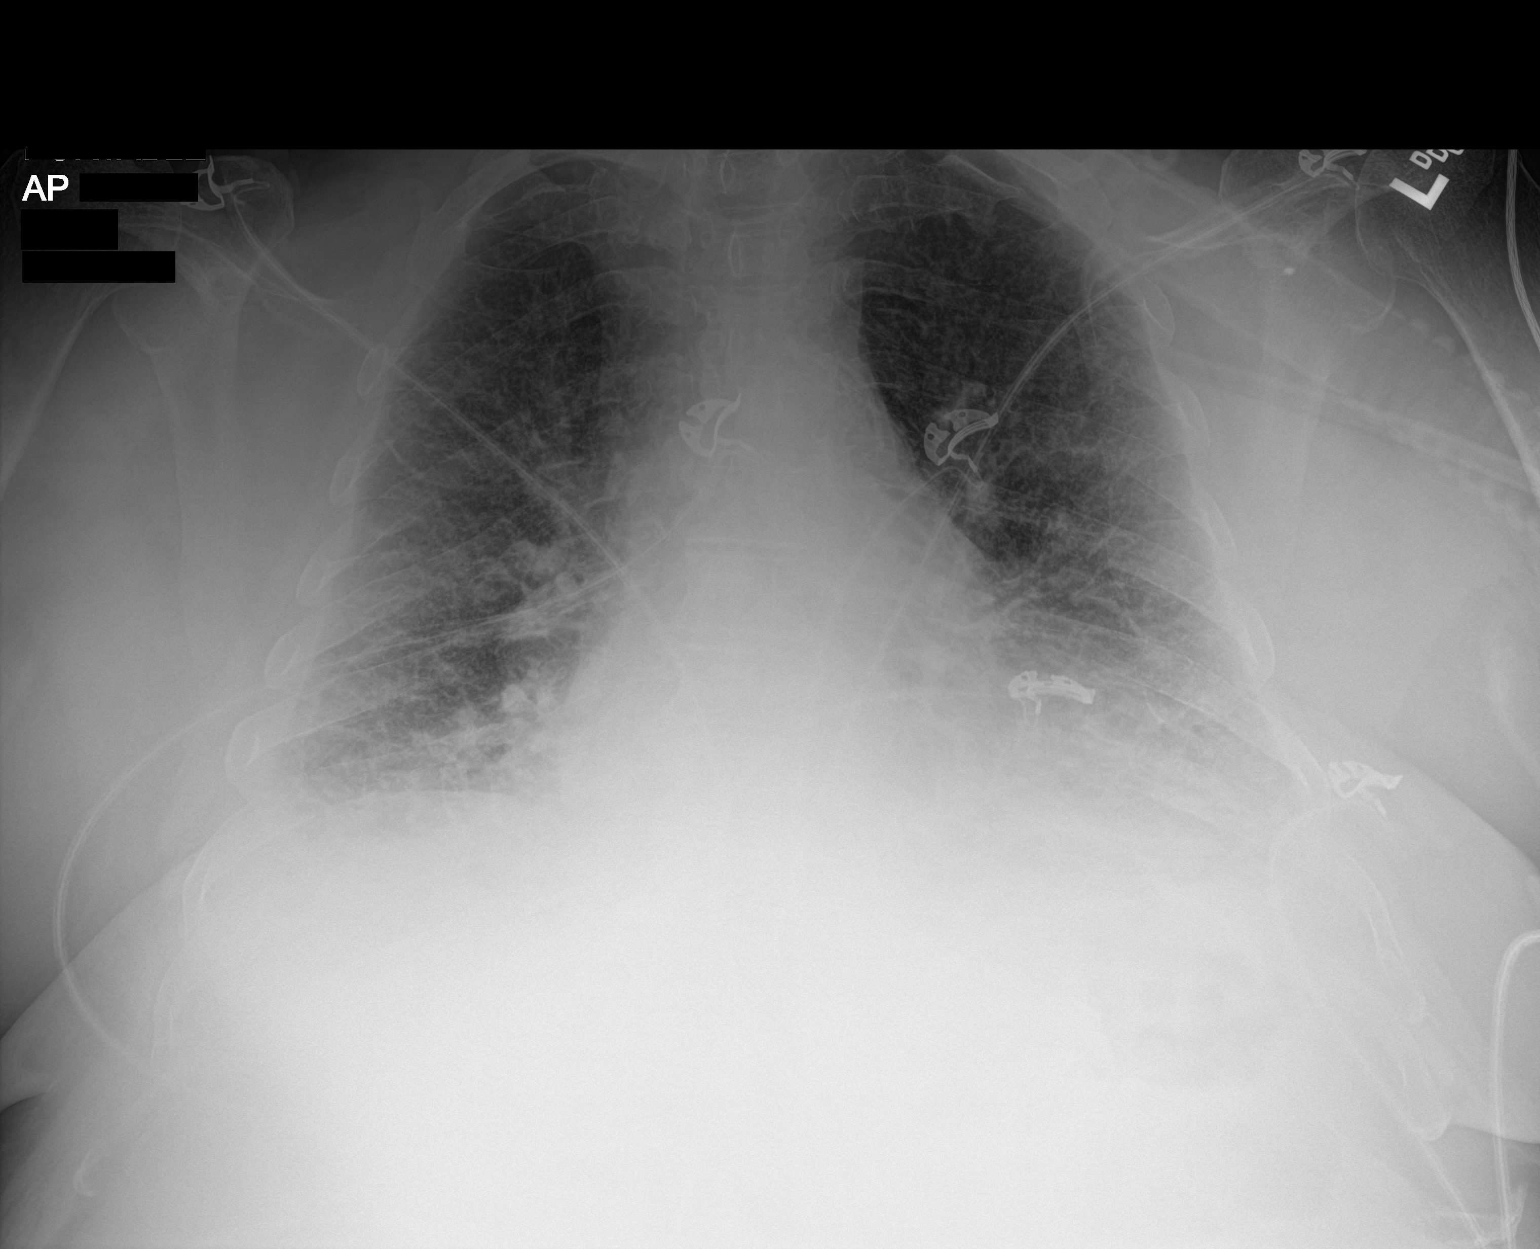

[1 of 1 positions shown; findings below may reference images not displayed]

FINDINGS: The lungs are well-expanded. The interstitial markings are
increased. The pulmonary vascularity is engorged. The cardiac
silhouette is enlarged. There small bilateral pleural effusions.
There is no pneumothorax.
IMPRESSION: CHF with interstitial edema superimposed upon COPD. No discrete
pneumonia.

## 2018-04-07 IMAGING — DX DG CHEST 1V PORT
1 series · 1 of 1 positions shown · non-contrast
Comparison: Chest radiograph July 05, 2016

CLINICAL DATA: Interval change, CHF.

EXAM:
PORTABLE CHEST 1 VIEW

[chest ap]
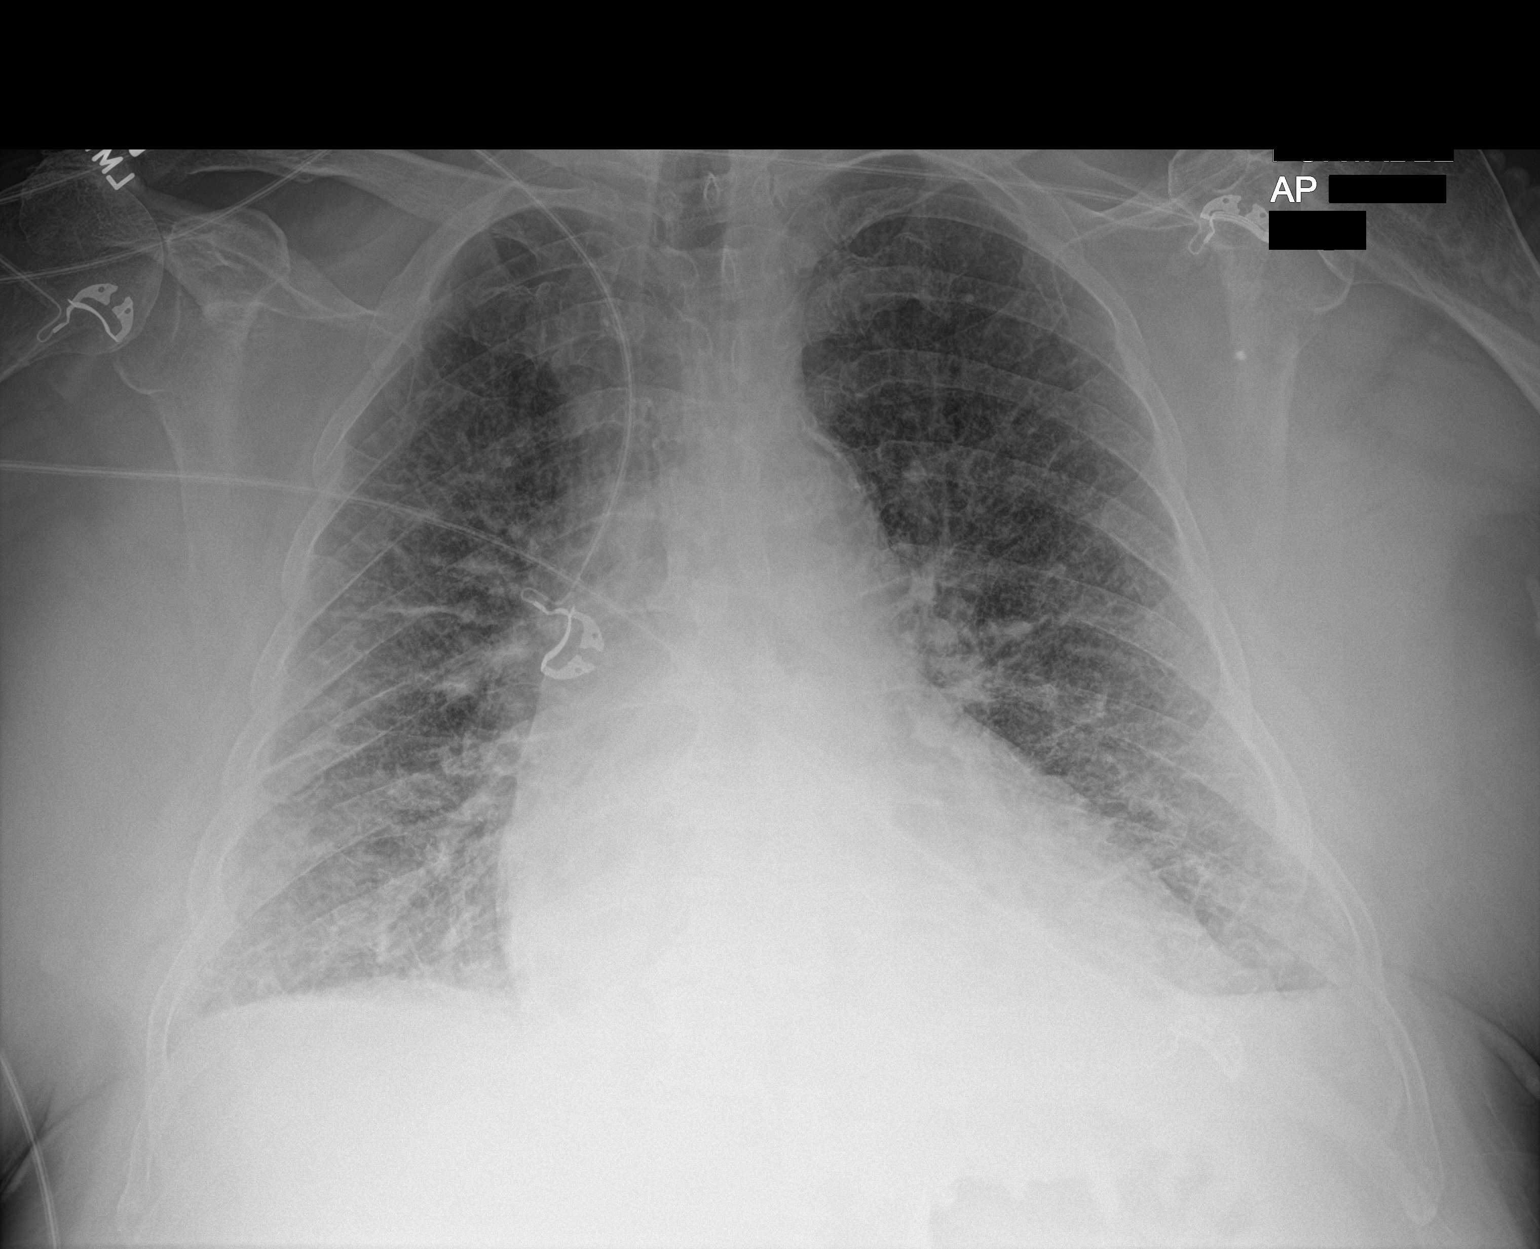

[1 of 1 positions shown; findings below may reference images not displayed]

FINDINGS: Cardiac silhouette is mildly enlarged and unchanged. Calcified
aortic knob. Similar interstitial prominence and increased lung
volumes. Trace residual pleural effusions, improved. No
pneumothorax. Soft tissue planes and included osseous structures are
unchanged.
IMPRESSION: Cardiomegaly and resolving interstitial edema on a background of
COPD.Trace residual pleural effusions, improved.

## 2018-04-08 IMAGING — DX DG CHEST 1V PORT
1 series · 1 of 1 positions shown · non-contrast
Comparison: 07/06/2016.

CLINICAL DATA: Respiratory failure .

EXAM:
PORTABLE CHEST 1 VIEW

[chest ap]
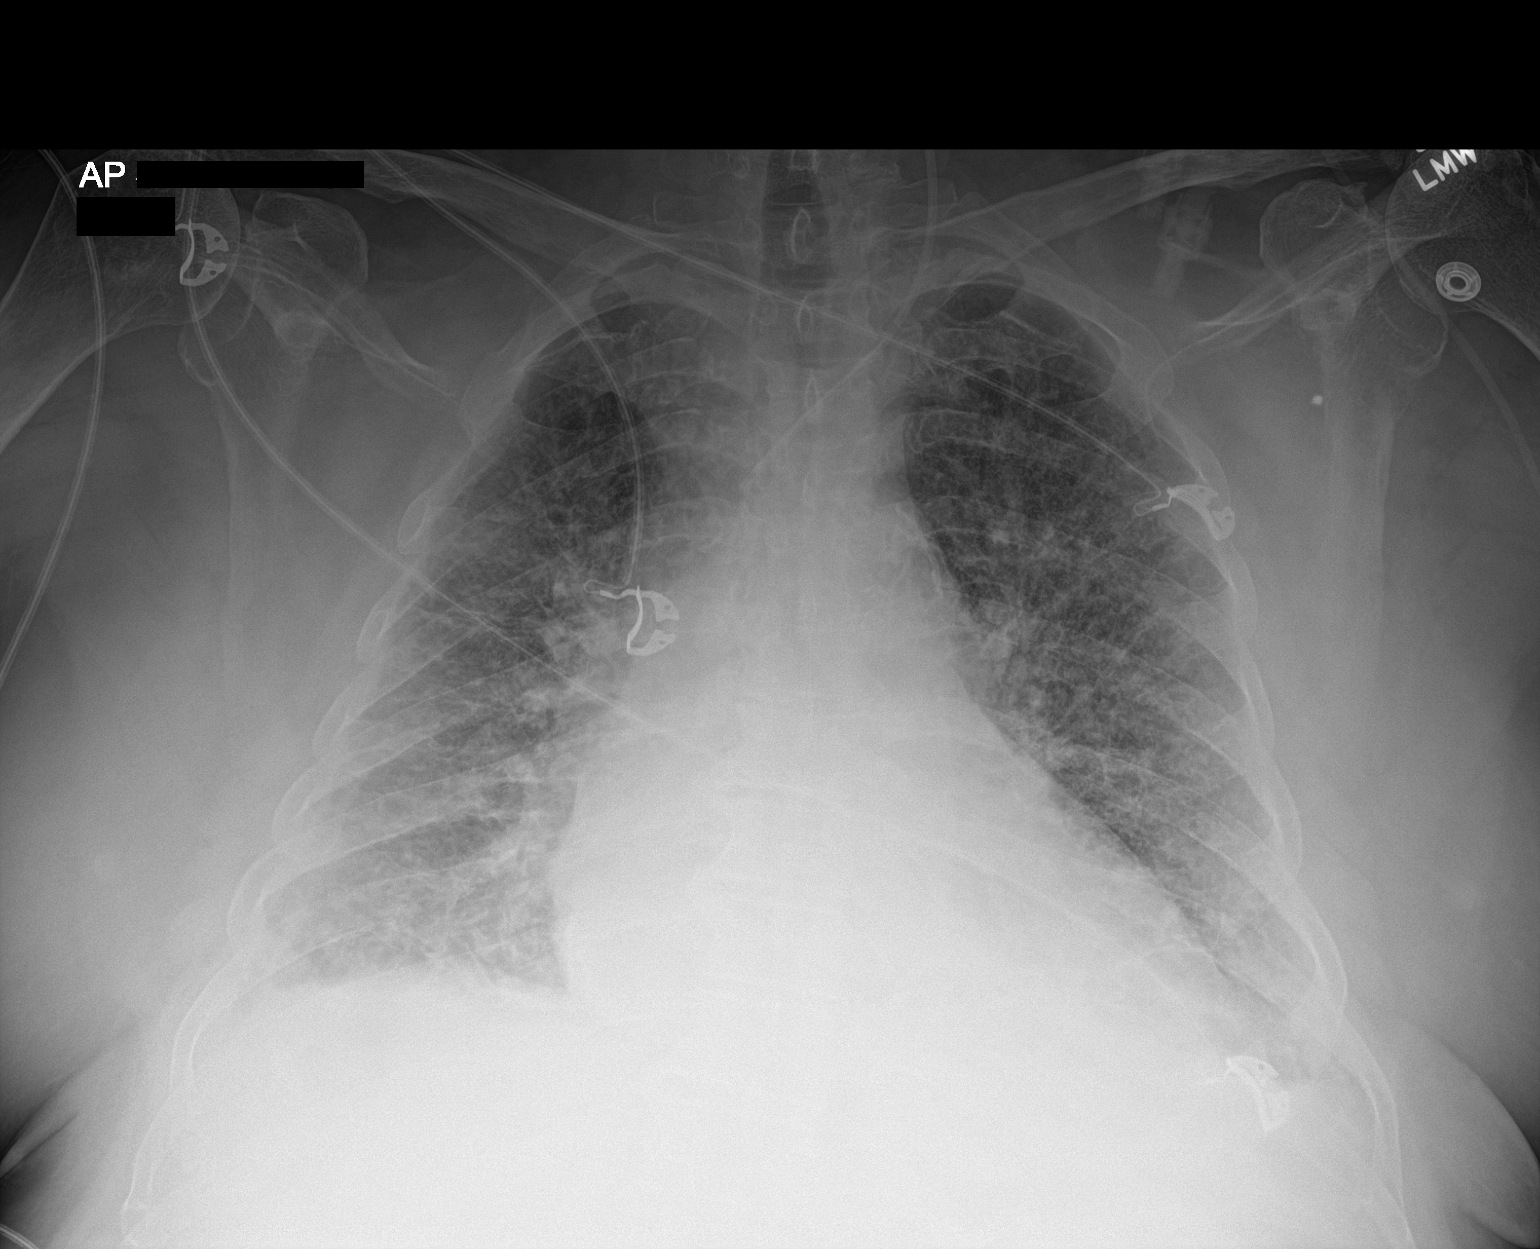

[1 of 1 positions shown; findings below may reference images not displayed]

FINDINGS: Left IJ line in stable position. Cardiomegaly with diffuse bilateral
from interstitial prominence. Small bilateral pleural effusions.
Findings consistent with CHF. No change from prior exam.
IMPRESSION: 1. Left IJ line stable position.

2. Congestive heart failure with bilateral pulmonary interstitial
edema and bilateral pleural effusions. No change from prior exam.

## 2018-04-08 IMAGING — DX DG ABD PORTABLE 1V
2 series · 2 of 2 positions shown · non-contrast
Comparison: Ultrasound 07/06/2016.  CT 07/25/2012.

CLINICAL DATA: Abdominal distention.

EXAM:
PORTABLE ABDOMEN - 1 VIEW

[abdomen kub (1 of 2)]
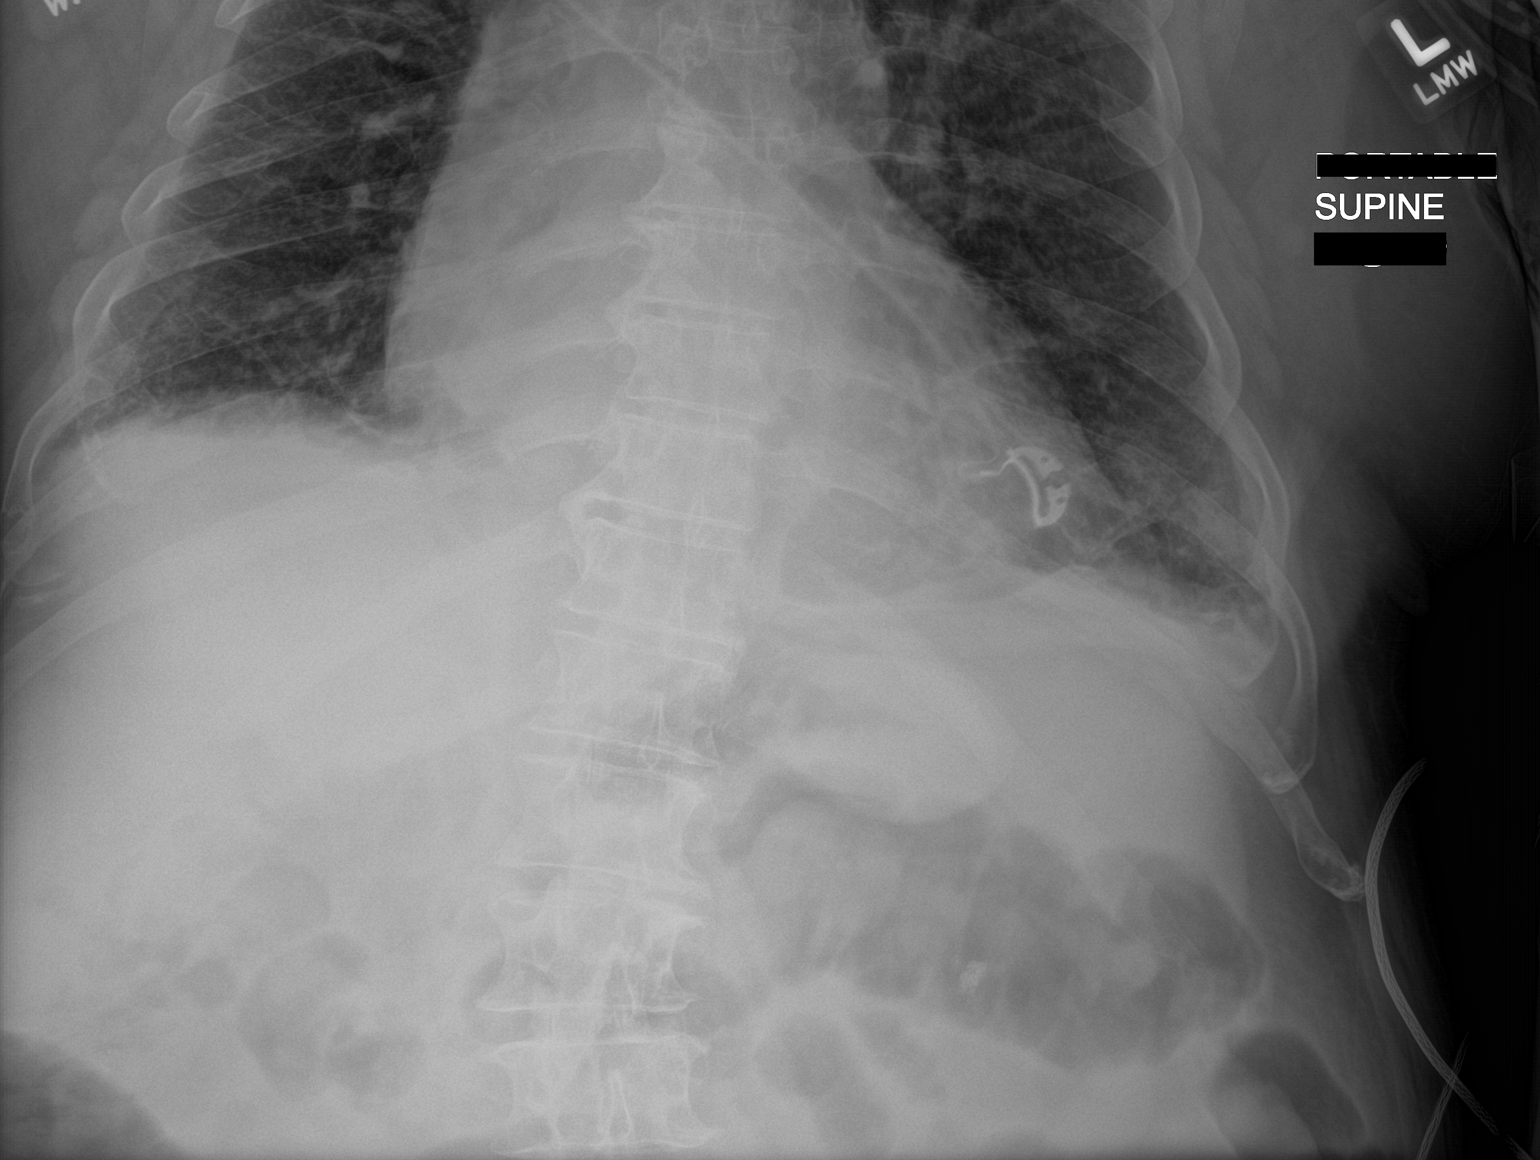

[abdomen kub (2 of 2)]
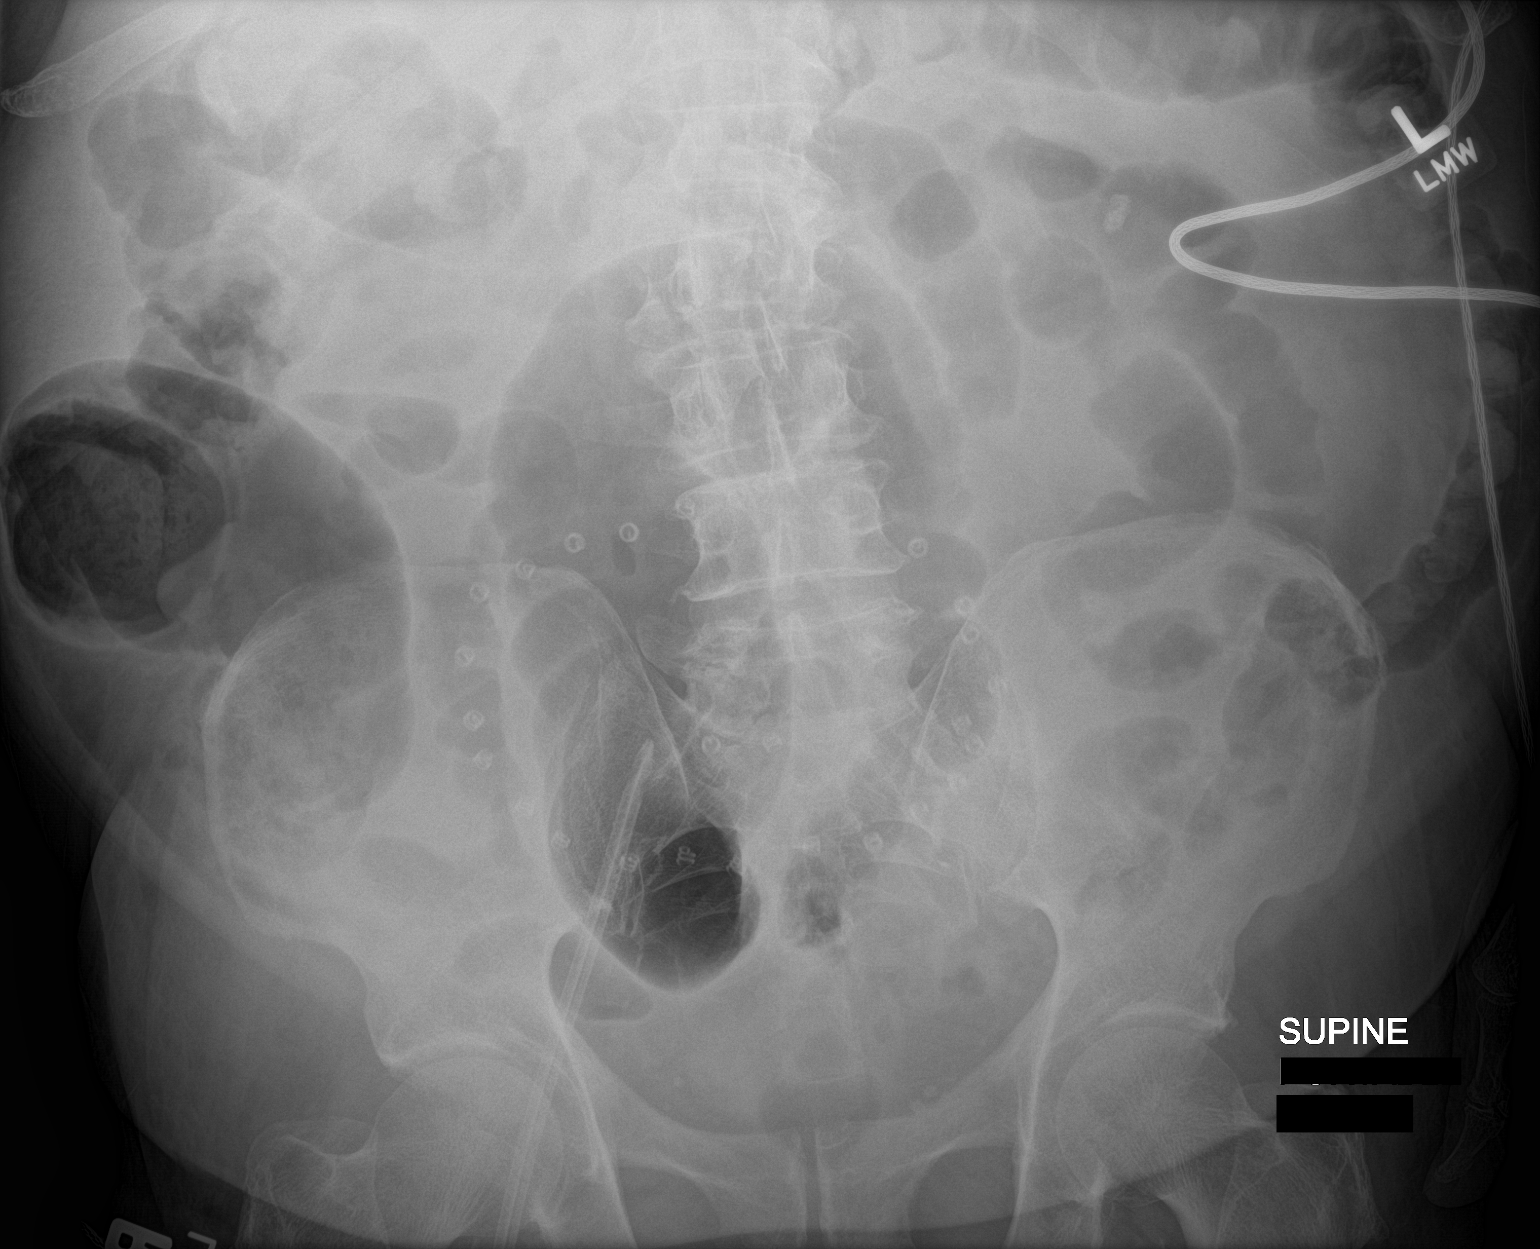

[2 of 2 positions shown; findings below may reference images not displayed]

FINDINGS: Prior abdominal hernia repair. Right femoral vascular catheter noted
with its tip projected over the mid pelvis. Distended loops of small
and large bowel noted. Findings suggest adynamic ileus. Follow-up
exams suggested to demonstrate resolution and to exclude bowel
obstruction. No free air. Left nephrolithiasis cannot be excluded.
Pelvic calcifications noted, these are most consistent with
phleboliths. Cardiomegaly with interstitial prominence bilateral
pleural effusions consistent CHF.
IMPRESSION: 1. Right femoral venous catheter noted with tip over the mid pelvis.

2. Distended loops of small and large bowel suggesting adynamic
ileus. Follow-up exam suggested demonstrate resolution and to
exclude bowel obstruction.

3.  Prior abdominal hernia repair.

4. Left nephrolithiasis cannot be excluded.

5. CHF .

## 2018-05-13 ENCOUNTER — Ambulatory Visit: Payer: Medicare HMO | Admitting: Internal Medicine

## 2018-05-13 ENCOUNTER — Encounter: Payer: Self-pay | Admitting: Internal Medicine

## 2018-05-13 VITALS — BP 122/58 | HR 76 | Resp 16 | Ht 66.0 in | Wt 234.4 lb

## 2018-05-13 DIAGNOSIS — J4489 Other specified chronic obstructive pulmonary disease: Secondary | ICD-10-CM

## 2018-05-13 DIAGNOSIS — I2781 Cor pulmonale (chronic): Secondary | ICD-10-CM | POA: Diagnosis not present

## 2018-05-13 DIAGNOSIS — J449 Chronic obstructive pulmonary disease, unspecified: Secondary | ICD-10-CM

## 2018-05-13 DIAGNOSIS — R0602 Shortness of breath: Secondary | ICD-10-CM | POA: Diagnosis not present

## 2018-05-13 DIAGNOSIS — Z9981 Dependence on supplemental oxygen: Secondary | ICD-10-CM | POA: Diagnosis not present

## 2018-05-13 NOTE — Progress Notes (Signed)
[facility] [place], [place] [address]  Pulmonary Sleep Medicine   Office Visit Note  Patient Name: [name] [name] DOB: 1945/08/16 MRN [id number]  Date of Service: 05/13/2018  Complaints/HPI: OSA, COPD Oxygen dependent. Cor Pulmonale Compliance 97% he states that he is doing very well.  Has been tolerating the CPAP his compliance rate is 97%.  Patient is also using his oxygen which he needs to be using 24 hours a day 7 days a week.  Patient states that he is compliant with his oxygen.  He was also told by his cardiologist that he probably has cor pulmonale or a "weak right heart" the patient understands that oxygen will help him in this situation.  Again compliance was discussed at Length  ROS  General: (-) fever, (-) chills, (-) night sweats, (-) weakness Skin: (-) rashes, (-) itching,. Eyes: (-) visual changes, (-) redness, (-) itching. Nose and Sinuses: (-) nasal stuffiness or itchiness, (-) postnasal drip, (-) nosebleeds, (-) sinus trouble. Mouth and Throat: (-) sore throat, (-) hoarseness. Neck: (-) swollen glands, (-) enlarged thyroid, (-) neck pain. Respiratory: - cough, (-) bloody sputum, + shortness of breath, - wheezing. Cardiovascular: - ankle swelling, (-) chest pain. Lymphatic: (-) lymph node enlargement. Neurologic: (-) numbness, (-) tingling. Psychiatric: (-) anxiety, (-) depression   Current Medication: Outpatient Encounter Medications as of 05/13/2018  Medication Sig  . acetaminophen (TYLENOL) 325 MG tablet Take 2 tablets (650 mg total) by mouth every 4 (four) hours as needed for mild pain (temp > 101.5).  Marland Kitchen albuterol (VENTOLIN HFA) 108 (90 Base) MCG/ACT inhaler Inhale into the lungs.  . Ascorbic Acid (VITAMIN C) 1000 MG tablet Take 1,000 mg by mouth daily.  Marland Kitchen aspirin EC 81 MG EC tablet Take 1 tablet (81 mg total) by mouth daily.  . budesonide (PULMICORT) 0.5 MG/2ML nebulizer solution Take 2 mLs (0.5 mg total) by nebulization 2  (two) times daily.  . citalopram (CELEXA) 20 MG tablet Take 1 tablet (20 mg total) by mouth daily.  . clopidogrel (PLAVIX) 75 MG tablet Take 75 mg by mouth daily.  . famotidine (PEPCID) 20 MG tablet Take 1 tablet (20 mg total) by mouth at bedtime.  . fenofibrate 160 MG tablet Take 160 mg by mouth daily.  . fluticasone (FLONASE) 50 MCG/ACT nasal spray Place 2 sprays into both nostrils daily.  . furosemide (LASIX) 40 MG tablet Take 1 tablet (40 mg total) by mouth 2 (two) times daily.  Marland Kitchen gabapentin (NEURONTIN) 100 MG capsule Take 2 capsules (200 mg total) by mouth 2 (two) times daily.  . heparin 5000 UNIT/ML injection Inject 1 mL (5,000 Units total) into the skin every 8 (eight) hours.  . hydrALAZINE (APRESOLINE) 50 MG tablet Take 50 mg by mouth 2 (two) times daily.  . insulin aspart (NOVOLOG) 100 UNIT/ML injection Inject 0-9 Units into the skin every 4 (four) hours.  Marland Kitchen ipratropium-albuterol (DUONEB) 0.5-2.5 (3) MG/3ML SOLN Take 3 mLs by nebulization every 6 (six) hours.  . methocarbamol (ROBAXIN) 750 MG tablet Take 750 mg by mouth 4 (four) times daily.  . metoprolol succinate (TOPROL-XL) 50 MG 24 hr tablet Take 50 mg by mouth daily. Take with or immediately following a meal.  . OXYGEN Inhale into the lungs continuous.  . pantoprazole (PROTONIX) 40 MG tablet Take 40 mg by mouth daily.  . Potassium 95 MG TABS Take by mouth.  . sodium chloride (OCEAN) 0.65 % SOLN nasal spray Place 1 spray into both nostrils as needed  for congestion.  . tamsulosin (FLOMAX) 0.4 MG CAPS capsule Take 1 capsule (0.4 mg total) by mouth daily.  Marland Kitchen torsemide (DEMADEX) 20 MG tablet Take 20 mg by mouth daily.  . fluticasone-salmeterol (ADVAIR HFA) 115-21 MCG/ACT inhaler Inhale into the lungs.   No facility-administered encounter medications on file as of 05/13/2018.     Surgical History: Past Surgical History:  Procedure Laterality Date  . HERNIA REPAIR    . LITHOTRIPSY    . SUPRAVALVULAR AORTIC STENOSIS REPAIR       Medical History: Past Medical History:  Diagnosis Date  . CHF (congestive heart failure) ([place])   . COPD (chronic obstructive pulmonary disease) ([place])   . Diabetes mellitus without complication ([place])   . Hypertension   . Kidney stones   . Renal disorder     Family History: Family History  Adopted: Yes    Social History: Social History   Socioeconomic History  . Marital status: Married    Spouse name: Not on file  . Number of children: Not on file  . Years of education: Not on file  . Highest education level: Not on file  Occupational History  . Not on file  Social Needs  . Financial resource strain: Not on file  . Food insecurity:    Worry: Not on file    Inability: Not on file  . Transportation needs:    Medical: Not on file    Non-medical: Not on file  Tobacco Use  . Smoking status: Former Research scientist (life sciences)  . Smokeless tobacco: Former Systems developer    Quit date: 05/16/2004  Substance and Sexual Activity  . Alcohol use: No  . Drug use: Not on file  . Sexual activity: Not on file  Lifestyle  . Physical activity:    Days per week: Not on file    Minutes per session: Not on file  . Stress: Not on file  Relationships  . Social connections:    Talks on phone: Not on file    Gets together: Not on file    Attends religious service: Not on file    Active member of club or organization: Not on file    Attends meetings of clubs or organizations: Not on file    Relationship status: Not on file  . Intimate partner violence:    Fear of current or ex partner: Not on file    Emotionally abused: Not on file    Physically abused: Not on file    Forced sexual activity: Not on file  Other Topics Concern  . Not on file  Social History Narrative  . Not on file    Vital Signs: Blood pressure (!) 122/58, pulse 76, resp. rate 16, height 5\' 6"  (1.676 m), weight 234 lb 6.4 oz (106.3 kg), SpO2 96 %.  Examination: General Appearance: The patient is well-developed, well-nourished, and in  no distress. Skin: Gross inspection of skin unremarkable. Head: normocephalic, no gross deformities. Eyes: no gross deformities noted. ENT: ears appear grossly normal no exudates. Neck: Supple. No thyromegaly. No LAD. Respiratory: no rhonci noted. Cardiovascular: Normal S1 and S2 without murmur or rub. Extremities: No cyanosis. pulses are equal. Neurologic: Alert and oriented. No involuntary movements.  LABS: No results found for this or any previous visit (from the past 2160 hour(s)).  Radiology: US Arterial Lower Extremity Duplex Bilateral  Result Date: 03/09/2017 CLINICAL DATA:  Bilateral leg pain.  Coronary artery disease. EXAM: BILATERAL LOWER EXTREMITY ARTERIAL DUPLEX SCAN TECHNIQUE: Gray-scale sonography as well as color  Doppler and duplex ultrasound was performed to evaluate the arteries of both lower extremities including the common, superficial and profunda femoral arteries, popliteal artery and calf arteries. COMPARISON:  CT 05/31/2011 FINDINGS: Right Lower Extremity ABI: Not measured Inflow: Normal common femoral arterial waveforms and velocities. No suggestion of significant inflow (aortoiliac) disease. Outflow: Normal profunda femoral, superficial femoral and popliteal arterial waveforms and velocities. No focal elevation of the PSV to suggest stenosis. Runoff: Normal posterior and anterior tibial arterial waveforms and velocities. No stenosis or segmental occlusion on survey interrogation. Left Lower Extremity ABI: Not measured Inflow: Normal common femoral arterial waveforms and velocities. No suggestion of significant inflow (aortoiliac) disease. Outflow: Normal profunda femoral, superficial femoral and popliteal arterial waveforms and velocities. No focal elevation of the PSV to suggest stenosis. Runoff: Normal posterior and anterior tibial arterial waveforms and velocities. No stenosis or segmental occlusion on survey interrogation. IMPRESSION: 1. Unremarkable . Consider  confirmation with pre and postexercise ABIs if symptoms persist. Electronically Signed   By: Lucrezia Europe M.D.   On: 03/09/2017 09:08    No results found.  No results found.    Assessment and Plan: Patient Active Problem List   Diagnosis Date Noted  . Acute renal failure (ARF) ([place])   . COPD exacerbation ([place])   . Respiratory failure ([place]) 07/05/2016    1. OSA on CPAP doing well with the compliance we will continue with CPAP at the current pressures his compliance rate was 97% this will serve as a face-to-face evaluation for CPAP compliance and tolerance patient is using the device and gaining benefit from it 2. COPD so patient is going to have a follow-up pulmonary function study done to reevaluate.  He currently is not smoking he is doing well as above he continues to use the oxygen as prescribed 3. Oxygen dependent on O2 right now which will be continued 4. Cor Pulmonale supportive care oxygen therapy patient will follow-up with his primary cardiologist  General Counseling: I have discussed the findings of the evaluation and examination with [name] [name].  I have also discussed any further diagnostic evaluation thatmay be needed or ordered today. [name] [name] verbalizes understanding of the findings of todays visit. We also reviewed his medications today and discussed drug interactions and side effects including but not limited excessive drowsiness and altered mental states. We also discussed that there is always a risk not just to him but also people around him. he has been encouraged to call the office with any questions or concerns that should arise related to todays visit.    Time spent: 15 minutes  I have personally obtained a history, examined the patient, evaluated laboratory and imaging results, formulated the assessment and plan and placed orders.    Allyne Gee, MD [facility] Pulmonary and Critical Care Sleep medicine

## 2018-05-13 NOTE — Patient Instructions (Signed)

## 2018-06-19 ENCOUNTER — Ambulatory Visit: Payer: Self-pay | Admitting: Internal Medicine

## 2018-07-10 ENCOUNTER — Ambulatory Visit: Payer: Medicare HMO | Admitting: Internal Medicine

## 2018-07-10 DIAGNOSIS — R0602 Shortness of breath: Secondary | ICD-10-CM

## 2018-07-10 LAB — PULMONARY FUNCTION TEST

## 2018-07-30 NOTE — Procedures (Signed)
[facility] [place], [address]  DATE OF SERVICE: July 10, 2018  Complete Pulmonary Function Testing Interpretation:  FINDINGS:  The forced vital capacity is mildly decreased.  The FEV1 is 1.54 L and is 57% of predicted which is moderately decreased.  Postbronchodilator there is no significant change in the FEV1.  The FEV1 FVC ratio is mildly decreased.  Total lung capacity is mildly decreased.  Residual volume is mildly decreased.  Residual anti-lung capacity ratio is increased.  The DLCO is severely decreased.  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  DLCO was severely decreased clinical correlation is recommended  Allyne Gee, MD [facility] Pulmonary Critical Care Medicine Sleep Medicine

## 2018-08-14 ENCOUNTER — Encounter: Payer: Self-pay | Admitting: Internal Medicine

## 2018-11-21 ENCOUNTER — Ambulatory Visit: Payer: Medicare HMO | Admitting: Internal Medicine

## 2018-11-21 ENCOUNTER — Encounter: Payer: Self-pay | Admitting: Internal Medicine

## 2018-11-21 ENCOUNTER — Other Ambulatory Visit: Payer: Self-pay

## 2018-11-21 VITALS — BP 141/58 | HR 64 | Resp 16 | Ht 66.0 in | Wt 227.0 lb

## 2018-11-21 DIAGNOSIS — R0602 Shortness of breath: Secondary | ICD-10-CM | POA: Diagnosis not present

## 2018-11-21 DIAGNOSIS — I509 Heart failure, unspecified: Secondary | ICD-10-CM

## 2018-11-21 DIAGNOSIS — G4733 Obstructive sleep apnea (adult) (pediatric): Secondary | ICD-10-CM

## 2018-11-21 DIAGNOSIS — Z9981 Dependence on supplemental oxygen: Secondary | ICD-10-CM

## 2018-11-21 DIAGNOSIS — Z9989 Dependence on other enabling machines and devices: Secondary | ICD-10-CM

## 2018-11-21 DIAGNOSIS — J449 Chronic obstructive pulmonary disease, unspecified: Secondary | ICD-10-CM

## 2018-11-21 NOTE — Progress Notes (Signed)
[facility] [place], [place] [address]  Pulmonary Sleep Medicine   Office Visit Note  Patient Name: [name] [name] DOB: 1945/08/17 MRN [id number]  Date of Service: 11/21/2018  Complaints/HPI: Patient is here for follow-up he states that he has been experiencing shortness of breath is now using the oxygen after having been told by his cardiologist that he needs to be more compliant with the O2.  Apparently had some issues with right side of his heart and likely is due to nocturnal hypoxia and daytime hypoxia.  Patient states he is using his CPAP has not had machine checked in a while so we will need to have his device evaluated and downloaded.  No admissions to the hospital.  He has little bit of swelling on his legs which he states is about the same continues to follow cardiology already mentioned for his cardiac issues.  ROS  General: (-) fever, (-) chills, (-) night sweats, (-) weakness Skin: (-) rashes, (-) itching,. Eyes: (-) visual changes, (-) redness, (-) itching. Nose and Sinuses: (-) nasal stuffiness or itchiness, (-) postnasal drip, (-) nosebleeds, (-) sinus trouble. Mouth and Throat: (-) sore throat, (-) hoarseness. Neck: (-) swollen glands, (-) enlarged thyroid, (-) neck pain. Respiratory: - cough, (-) bloody sputum, + shortness of breath, - wheezing. Cardiovascular: + ankle swelling, (-) chest pain. Lymphatic: (-) lymph node enlargement. Neurologic: (-) numbness, (-) tingling. Psychiatric: (-) anxiety, (-) depression   Current Medication: Outpatient Encounter Medications as of 11/21/2018  Medication Sig  . acetaminophen (TYLENOL) 325 MG tablet Take 2 tablets (650 mg total) by mouth every 4 (four) hours as needed for mild pain (temp > 101.5).  Marland Kitchen albuterol (VENTOLIN HFA) 108 (90 Base) MCG/ACT inhaler Inhale into the lungs.  . Ascorbic Acid (VITAMIN C) 1000 MG tablet Take 1,000 mg by mouth daily.  Marland Kitchen aspirin EC 81 MG EC tablet Take 1  tablet (81 mg total) by mouth daily.  . budesonide (PULMICORT) 0.5 MG/2ML nebulizer solution Take 2 mLs (0.5 mg total) by nebulization 2 (two) times daily.  . citalopram (CELEXA) 20 MG tablet Take 1 tablet (20 mg total) by mouth daily.  . clopidogrel (PLAVIX) 75 MG tablet Take 75 mg by mouth daily.  . famotidine (PEPCID) 20 MG tablet Take 1 tablet (20 mg total) by mouth at bedtime.  . fenofibrate 160 MG tablet Take 160 mg by mouth daily.  . fluticasone (FLONASE) 50 MCG/ACT nasal spray Place 2 sprays into both nostrils daily.  . fluticasone-salmeterol (ADVAIR HFA) 115-21 MCG/ACT inhaler Inhale into the lungs.  . furosemide (LASIX) 40 MG tablet Take 1 tablet (40 mg total) by mouth 2 (two) times daily.  Marland Kitchen gabapentin (NEURONTIN) 100 MG capsule Take 2 capsules (200 mg total) by mouth 2 (two) times daily.  . heparin 5000 UNIT/ML injection Inject 1 mL (5,000 Units total) into the skin every 8 (eight) hours.  . hydrALAZINE (APRESOLINE) 50 MG tablet Take 50 mg by mouth 2 (two) times daily.  . insulin aspart (NOVOLOG) 100 UNIT/ML injection Inject 0-9 Units into the skin every 4 (four) hours.  Marland Kitchen ipratropium-albuterol (DUONEB) 0.5-2.5 (3) MG/3ML SOLN Take 3 mLs by nebulization every 6 (six) hours.  . methocarbamol (ROBAXIN) 750 MG tablet Take 750 mg by mouth 4 (four) times daily.  . metoprolol succinate (TOPROL-XL) 50 MG 24 hr tablet Take 50 mg by mouth daily. Take with or immediately following a meal.  . NON FORMULARY cpap w/o2  . OXYGEN Inhale into the lungs  continuous.  . pantoprazole (PROTONIX) 40 MG tablet Take 40 mg by mouth daily.  . Potassium 95 MG TABS Take by mouth.  . sodium chloride (OCEAN) 0.65 % SOLN nasal spray Place 1 spray into both nostrils as needed for congestion.  . tamsulosin (FLOMAX) 0.4 MG CAPS capsule Take 1 capsule (0.4 mg total) by mouth daily.  Marland Kitchen torsemide (DEMADEX) 20 MG tablet Take 20 mg by mouth daily.   No facility-administered encounter medications on file as of  11/21/2018.     Surgical History: Past Surgical History:  Procedure Laterality Date  . HERNIA REPAIR    . LITHOTRIPSY    . SUPRAVALVULAR AORTIC STENOSIS REPAIR      Medical History: Past Medical History:  Diagnosis Date  . CHF (congestive heart failure) ([place])   . COPD (chronic obstructive pulmonary disease) ([place])   . Diabetes mellitus without complication ([place])   . Hypertension   . Kidney stones   . Renal disorder     Family History: Family History  Adopted: Yes    Social History: Social History   Socioeconomic History  . Marital status: Married    Spouse name: Not on file  . Number of children: Not on file  . Years of education: Not on file  . Highest education level: Not on file  Occupational History  . Not on file  Social Needs  . Financial resource strain: Not on file  . Food insecurity    Worry: Not on file    Inability: Not on file  . Transportation needs    Medical: Not on file    Non-medical: Not on file  Tobacco Use  . Smoking status: Former Research scientist (life sciences)  . Smokeless tobacco: Former Systems developer    Quit date: 05/16/2004  Substance and Sexual Activity  . Alcohol use: No  . Drug use: Not on file  . Sexual activity: Not on file  Lifestyle  . Physical activity    Days per week: Not on file    Minutes per session: Not on file  . Stress: Not on file  Relationships  . Social Herbalist on phone: Not on file    Gets together: Not on file    Attends religious service: Not on file    Active member of club or organization: Not on file    Attends meetings of clubs or organizations: Not on file    Relationship status: Not on file  . Intimate partner violence    Fear of current or ex partner: Not on file    Emotionally abused: Not on file    Physically abused: Not on file    Forced sexual activity: Not on file  Other Topics Concern  . Not on file  Social History Narrative  . Not on file    Vital Signs: Blood pressure (!) 141/58, pulse 64, resp. rate  16, height 5\' 6"  (1.676 m), weight 227 lb (103 kg), SpO2 96 %.  Examination: General Appearance: The patient is well-developed, well-nourished, and in no distress. Skin: Gross inspection of skin unremarkable. Head: normocephalic, no gross deformities. Eyes: no gross deformities noted. ENT: ears appear grossly normal no exudates. Neck: Supple. No thyromegaly. No LAD. Respiratory: Distant no rhonchi noted at this time. Cardiovascular: Normal S1 and S2 without murmur or rub. Extremities: No cyanosis. pulses are equal. Neurologic: Alert and oriented. No involuntary movements.  LABS: No results found for this or any previous visit (from the past 2160 hour(s)).  Radiology: US Arterial Lower  Extremity Duplex Bilateral  Result Date: 03/09/2017 CLINICAL DATA:  Bilateral leg pain.  Coronary artery disease. EXAM: BILATERAL LOWER EXTREMITY ARTERIAL DUPLEX SCAN TECHNIQUE: Gray-scale sonography as well as color Doppler and duplex ultrasound was performed to evaluate the arteries of both lower extremities including the common, superficial and profunda femoral arteries, popliteal artery and calf arteries. COMPARISON:  CT 05/31/2011 FINDINGS: Right Lower Extremity ABI: Not measured Inflow: Normal common femoral arterial waveforms and velocities. No suggestion of significant inflow (aortoiliac) disease. Outflow: Normal profunda femoral, superficial femoral and popliteal arterial waveforms and velocities. No focal elevation of the PSV to suggest stenosis. Runoff: Normal posterior and anterior tibial arterial waveforms and velocities. No stenosis or segmental occlusion on survey interrogation. Left Lower Extremity ABI: Not measured Inflow: Normal common femoral arterial waveforms and velocities. No suggestion of significant inflow (aortoiliac) disease. Outflow: Normal profunda femoral, superficial femoral and popliteal arterial waveforms and velocities. No focal elevation of the PSV to suggest stenosis. Runoff:  Normal posterior and anterior tibial arterial waveforms and velocities. No stenosis or segmental occlusion on survey interrogation. IMPRESSION: 1. Unremarkable . Consider confirmation with pre and postexercise ABIs if symptoms persist. Electronically Signed   By: Lucrezia Europe M.D.   On: 03/09/2017 09:08    No results found.  No results found.    Assessment and Plan: Patient Active Problem List   Diagnosis Date Noted  . Acute renal failure (ARF) ([place])   . COPD exacerbation ([place])   . Respiratory failure ([place]) 07/05/2016    1. COPD  FEV1 57% based on the last pulmonary functions.  Advised him to continue with the nebulizers and inhalers as he is.  He also needs to continue with oxygen therapy as prescribed.  I stressed to him the importance of compliance with recommended therapy. 2. CHF following with his primary cardiologist.  Right now appears to be compensated we will continue with supportive care and again stressed on portance of using oxygen as prescribed 3. Oxygen dependent he is on 3 L oxygen and this needs to be continued patient was counseled on compliance 4. OSA on CPAP he needs follow-up with me CPAP clinic for download of his device.  We will continue with sleep hygiene measures as already previously discussed.  General Counseling: I have discussed the findings of the evaluation and examination with Thayer Jew.  I have also discussed any further diagnostic evaluation thatmay be needed or ordered today. Jakim verbalizes understanding of the findings of todays visit. We also reviewed his medications today and discussed drug interactions and side effects including but not limited excessive drowsiness and altered mental states. We also discussed that there is always a risk not just to him but also people around him. he has been encouraged to call the office with any questions or concerns that should arise related to todays visit.    Time spent: 47min  I have personally obtained a history,  examined the patient, evaluated laboratory and imaging results, formulated the assessment and plan and placed orders.    Allyne Gee, MD [facility] Pulmonary and Critical Care Sleep medicine

## 2018-11-21 NOTE — Patient Instructions (Signed)
Home Oxygen Use, Adult When a medical condition keeps you from getting enough oxygen, your health care provider may instruct you to take extra oxygen at home. Your health care provider will let you know:  When to take oxygen.  For how long to take oxygen.  How quickly oxygen should be delivered (flow rate), in liters per minute (LPM or L/M). Home oxygen can be given through:  A mask.  A nasal cannula. This is a device or tube that goes in the nostrils.  A transtracheal catheter. This is a small, flexible tube placed in the trachea.  A tracheostomy. This is a surgically made opening in the trachea. These devices are connected with tubing to an oxygen source, such as:  A tank. Tanks hold oxygen in gas form. They must be replaced when the oxygen is used up.  A liquid oxygen device. This holds oxygen in liquid form. It must be replaced when the oxygen is used up.  An oxygen concentrator machine. This filters oxygen in the room. It uses electricity, so you must have a backup cylinder of oxygen in case the power goes out. Supplies needed: To use oxygen, you will need:  A mask, nasal cannula, transtracheal catheter, or tracheostomy.  An oxygen tank, a liquid oxygen device, or an oxygen concentrator.  The tape that your health care provider recommends (optional). If you use a transtracheal catheter and your prescribed flow rate is 1 LPM or greater, you will also need a humidifier. Risks and complications  Fire. This can happen if the oxygen is exposed to a heat source, flame, or spark.  Injury to skin. This can happen if liquid oxygen touches your skin.  Organ damage. This can happen if you get too little oxygen. How to use oxygen Your health care provider or a representative from your medical device company will show you how to use your oxygen device. Follow her or his instructions. The instructions may look something like this: 1. Wash your hands. 2. If you use an oxygen  concentrator, make sure it is plugged in. 3. Place one end of the tube into the port on the tank, device, or machine. 4. Place the mask over your nose and mouth. Or, place the nasal cannula and secure it with tape if instructed. If you use a tracheostomy or transtracheal catheter, connect it to the oxygen source as directed. 5. Make sure the liter-flow setting on the machine is at the level prescribed by your health care provider. 6. Turn on the machine or adjust the knob on the tank or device to the correct liter-flow setting. 7. When you are done, turn off and unplug the machine, or turn the knob to OFF. How to clean and care for the oxygen supplies Nasal cannula  Clean it with a warm, wet cloth daily or as needed.  Wash it with a liquid soap once a week.  Rinse it thoroughly once or twice a week.  Replace it every 2-4 weeks.  If you have an infection, such as a cold or pneumonia, change the cannula when you get better. Mask  Replace it every 2-4 weeks.  If you have an infection, such as a cold or pneumonia, change the mask when you get better. Humidifier bottle  Wash the bottle between each refill: ? Wash it with soap and warm water. ? Rinse it thoroughly. ? Disinfect it and its top. ? Air-dry it.  Make sure it is dry before you refill it. Oxygen concentrator  Clean   the air filter at least twice a week according to directions from your home medical equipment and service company.  Wipe down the cabinet every day. To do this: ? Unplug the unit. ? Wipe down the cabinet with a damp cloth. ? Dry the cabinet. Other equipment  Change any extra tubing every 1-3 months.  Follow instructions from your health care provider about taking care of any other equipment. Safety tips Fire safety tips   Keep your oxygen and oxygen supplies at least 5 ft away from sources of heat, flames, and sparks at all times.  Do not allow smoking near your oxygen. Put up "no smoking" signs in  your home. Avoid smoking areas when in public.  Do not use materials that can burn (are flammable) while you use oxygen.  When you go to a restaurant with portable oxygen, ask to be seated in the nonsmoking section.  Keep a fire extinguisher close by. Let your fire department know that you have oxygen in your home.  Test your home smoke detectors regularly. Traveling  Secure your oxygen tank in the vehicle so that it does not move around. Follow instructions from your medical device company about how to safely secure your tank.  Make sure you have enough oxygen for the amount of time you will be away from home.  If you are planning air travel, contact the airline to find out if they allow the use of an approved portable oxygen concentrator. You may also need documents from your health care provider and medical device company before you travel. General safety tips  If you use an oxygen cylinder, make sure it is in a stand or secured to an object that will not move (fixed object).  If you use liquid oxygen, make sure its container is kept upright.  If you use an oxygen concentrator: ? Tell your electric company. Make sure you are given priority service in the event that your power goes out. ? Avoid using extension cords, if possible. Follow these instructions at home:  Use oxygen only as told by your health care provider.  Do not use alcohol or other drugs that make you relax (sedating drugs) unless instructed. They can slow down your breathing rate and make it hard to get in enough oxygen.  Know how and when to order a refill of oxygen.  Always keep a spare tank of oxygen. Plan ahead for holidays when you may not be able to get a prescription filled.  Use water-based lubricants on your lips or nostrils. Do not use oil-based products like petroleum jelly.  To prevent skin irritation on your cheeks or behind your ears, tuck some gauze under the tubing. Contact a health care  provider if:  You get headaches often.  You have shortness of breath.  You have a lasting cough.  You have anxiety.  You are sleepy all the time.  You develop an illness that affects your breathing.  You cannot exercise at your regular level.  You are restless.  You have difficult or irregular breathing, and it is getting worse.  You have a fever.  You have persistent redness under your nose. Get help right away if:  You are confused.  You have blue lips or fingernails.  You are struggling to breathe. Summary  Your health care provider or a representative from your medical device company will show you how to use your oxygen device. Follow her or his instructions.  If you use an oxygen concentrator, make   sure it is plugged in.  Make sure the liter-flow setting on the machine is at the level prescribed by your health care provider.  Keep your oxygen and oxygen supplies at least 5 ft away from sources of heat, flames, and sparks at all times. This information is not intended to replace advice given to you by your health care provider. Make sure you discuss any questions you have with your health care provider. Document Released: 07/01/2003 Document Revised: 09/27/2017 Document Reviewed: 11/02/2015 Elsevier Patient Education  2020 [employer].

## 2018-11-27 ENCOUNTER — Ambulatory Visit (INDEPENDENT_AMBULATORY_CARE_PROVIDER_SITE_OTHER): Payer: Medicare HMO

## 2018-11-27 ENCOUNTER — Other Ambulatory Visit: Payer: Self-pay

## 2018-11-27 DIAGNOSIS — G4733 Obstructive sleep apnea (adult) (pediatric): Secondary | ICD-10-CM | POA: Diagnosis not present

## 2018-11-27 NOTE — Progress Notes (Signed)
95 percentile pressure 12   95th percentile leak 50.0   apnea index 0.3 /hr  apnea-hypopnea index  0.7 /hr   total days used  >4 hr 26 days  total days used <4 hr 1 days  Total compliance 87 percent  Cpap machine working great . Turn humidifier up some. Advised any trouble to call, otherwise doing great on machine.

## 2019-02-24 DIAGNOSIS — N2581 Secondary hyperparathyroidism of renal origin: Secondary | ICD-10-CM | POA: Insufficient documentation

## 2019-02-24 DIAGNOSIS — E1122 Type 2 diabetes mellitus with diabetic chronic kidney disease: Secondary | ICD-10-CM

## 2019-02-24 DIAGNOSIS — R809 Proteinuria, unspecified: Secondary | ICD-10-CM | POA: Insufficient documentation

## 2019-02-24 DIAGNOSIS — I129 Hypertensive chronic kidney disease with stage 1 through stage 4 chronic kidney disease, or unspecified chronic kidney disease: Secondary | ICD-10-CM

## 2019-02-24 DIAGNOSIS — N050 Unspecified nephritic syndrome with minor glomerular abnormality: Secondary | ICD-10-CM | POA: Insufficient documentation

## 2019-02-24 HISTORY — DX: Hypertensive chronic kidney disease with stage 1 through stage 4 chronic kidney disease, or unspecified chronic kidney disease: I12.9

## 2019-03-26 ENCOUNTER — Telehealth: Payer: Self-pay

## 2019-03-26 NOTE — Telephone Encounter (Signed)
Called confirmed appointment with patient wanted to cancel and will call back to reschedule. klh

## 2019-03-27 ENCOUNTER — Ambulatory Visit: Payer: Medicare HMO | Admitting: Internal Medicine

## 2019-05-05 DIAGNOSIS — C4492 Squamous cell carcinoma of skin, unspecified: Secondary | ICD-10-CM

## 2019-05-05 HISTORY — DX: Squamous cell carcinoma of skin, unspecified: C44.92

## 2019-05-29 ENCOUNTER — Telehealth: Payer: Self-pay

## 2019-05-29 NOTE — Telephone Encounter (Signed)
CONFIRMED 06-02-19 OV AS VIRTUAL.

## 2019-06-02 ENCOUNTER — Ambulatory Visit: Payer: Medicare HMO | Admitting: Internal Medicine

## 2019-06-02 ENCOUNTER — Encounter: Payer: Self-pay | Admitting: Internal Medicine

## 2019-06-02 VITALS — Ht 66.0 in | Wt 215.0 lb

## 2019-06-02 DIAGNOSIS — G4733 Obstructive sleep apnea (adult) (pediatric): Secondary | ICD-10-CM | POA: Diagnosis not present

## 2019-06-02 DIAGNOSIS — Z9981 Dependence on supplemental oxygen: Secondary | ICD-10-CM | POA: Diagnosis not present

## 2019-06-02 DIAGNOSIS — Z9989 Dependence on other enabling machines and devices: Secondary | ICD-10-CM

## 2019-06-02 DIAGNOSIS — I509 Heart failure, unspecified: Secondary | ICD-10-CM | POA: Diagnosis not present

## 2019-06-02 DIAGNOSIS — J449 Chronic obstructive pulmonary disease, unspecified: Secondary | ICD-10-CM | POA: Diagnosis not present

## 2019-06-02 NOTE — Progress Notes (Signed)
[facility] [place], [place] [address]  Internal MEDICINE  Telephone Visit  Patient Name: [name] [name]  [id number]  [id number]  Date of Service: 06/02/2019  I connected with the patient at 1131 by telephone and verified the patients identity using two identifiers.   I discussed the limitations, risks, security and privacy concerns of performing an evaluation and management service by telephone and the availability of in person appointments. I also discussed with the patient that there may be a patient responsible charge related to the service.  The patient expressed understanding and agrees to proceed.    Chief Complaint  Patient presents with  . Telephone Assessment  . Telephone Screen  . COPD    HPI  PT seen via video.  He is seen to follow up on copd, and osa. He copd is moderately controlled.  He uses inhalers and nebulizer as needed.  He is supplemental Oxygen dependant.  He currently uses 3 lpm continually.  His oxygen is bleeding into this cpap machine as well. Pt reports good compliance with CPAP therapy. Cleaning machine by hand, and changing filters and tubing as directed. Denies headaches, sinus issues, palpitations, or hemoptysis.  He also has CHF which is followed by his cardiologist.     Current Medication: Outpatient Encounter Medications as of 06/02/2019  Medication Sig  . acetaminophen (TYLENOL) 325 MG tablet Take 2 tablets (650 mg total) by mouth every 4 (four) hours as needed for mild pain (temp > 101.5).  Marland Kitchen albuterol (VENTOLIN HFA) 108 (90 Base) MCG/ACT inhaler Inhale into the lungs.  . Ascorbic Acid (VITAMIN C) 1000 MG tablet Take 1,000 mg by mouth daily.  Marland Kitchen aspirin EC 81 MG EC tablet Take 1 tablet (81 mg total) by mouth daily.  . budesonide (PULMICORT) 0.5 MG/2ML nebulizer solution Take 2 mLs (0.5 mg total) by nebulization 2 (two) times daily.  . citalopram (CELEXA) 20 MG tablet Take 1 tablet (20 mg total) by mouth daily.  .  clopidogrel (PLAVIX) 75 MG tablet Take 75 mg by mouth daily.  . famotidine (PEPCID) 20 MG tablet Take 1 tablet (20 mg total) by mouth at bedtime.  . fenofibrate 160 MG tablet Take 160 mg by mouth daily.  . fluticasone (FLONASE) 50 MCG/ACT nasal spray Place 2 sprays into both nostrils daily.  . fluticasone-salmeterol (ADVAIR HFA) 115-21 MCG/ACT inhaler Inhale into the lungs.  . furosemide (LASIX) 40 MG tablet Take 1 tablet (40 mg total) by mouth 2 (two) times daily.  Marland Kitchen gabapentin (NEURONTIN) 100 MG capsule Take 2 capsules (200 mg total) by mouth 2 (two) times daily.  . heparin 5000 UNIT/ML injection Inject 1 mL (5,000 Units total) into the skin every 8 (eight) hours.  . hydrALAZINE (APRESOLINE) 50 MG tablet Take 50 mg by mouth 2 (two) times daily.  . insulin aspart (NOVOLOG) 100 UNIT/ML injection Inject 0-9 Units into the skin every 4 (four) hours.  Marland Kitchen ipratropium-albuterol (DUONEB) 0.5-2.5 (3) MG/3ML SOLN Take 3 mLs by nebulization every 6 (six) hours.  . methocarbamol (ROBAXIN) 750 MG tablet Take 750 mg by mouth 4 (four) times daily.  . metoprolol succinate (TOPROL-XL) 50 MG 24 hr tablet Take 50 mg by mouth daily. Take with or immediately following a meal.  . NON FORMULARY cpap w/o2  . OXYGEN Inhale into the lungs continuous.  . pantoprazole (PROTONIX) 40 MG tablet Take 40 mg by mouth daily.  . Potassium 95 MG TABS Take by mouth.  . sodium chloride (OCEAN) 0.65 %  SOLN nasal spray Place 1 spray into both nostrils as needed for congestion.  . tamsulosin (FLOMAX) 0.4 MG CAPS capsule Take 1 capsule (0.4 mg total) by mouth daily.  Marland Kitchen torsemide (DEMADEX) 20 MG tablet Take 20 mg by mouth daily.   No facility-administered encounter medications on file as of 06/02/2019.    Surgical History: Past Surgical History:  Procedure Laterality Date  . HERNIA REPAIR    . LITHOTRIPSY    . SUPRAVALVULAR AORTIC STENOSIS REPAIR      Medical History: Past Medical History:  Diagnosis Date  . CHF (congestive  heart failure) ([place])   . COPD (chronic obstructive pulmonary disease) ([place])   . Diabetes mellitus without complication ([place])   . Hypertension   . Kidney stones   . Renal disorder     Family History: Family History  Adopted: Yes    Social History   Socioeconomic History  . Marital status: Married    Spouse name: Not on file  . Number of children: Not on file  . Years of education: Not on file  . Highest education level: Not on file  Occupational History  . Not on file  Tobacco Use  . Smoking status: Former Research scientist (life sciences)  . Smokeless tobacco: Former Systems developer    Quit date: 05/16/2004  Substance and Sexual Activity  . Alcohol use: No  . Drug use: Not on file  . Sexual activity: Not on file  Other Topics Concern  . Not on file  Social History Narrative  . Not on file   Social Determinants of Health   Financial Resource Strain:   . Difficulty of Paying Living Expenses: Not on file  Food Insecurity:   . Worried About Charity fundraiser in the Last Year: Not on file  . Ran Out of Food in the Last Year: Not on file  Transportation Needs:   . Lack of Transportation (Medical): Not on file  . Lack of Transportation (Non-Medical): Not on file  Physical Activity:   . Days of Exercise per Week: Not on file  . Minutes of Exercise per Session: Not on file  Stress:   . Feeling of Stress : Not on file  Social Connections:   . Frequency of Communication with Friends and Family: Not on file  . Frequency of Social Gatherings with Friends and Family: Not on file  . Attends Religious Services: Not on file  . Active Member of Clubs or Organizations: Not on file  . Attends Archivist Meetings: Not on file  . Marital Status: Not on file  Intimate Partner Violence:   . Fear of Current or Ex-Partner: Not on file  . Emotionally Abused: Not on file  . Physically Abused: Not on file  . Sexually Abused: Not on file      Review of Systems  Constitutional: Negative.  Negative for  chills, fatigue and unexpected weight change.  HENT: Negative.  Negative for congestion, rhinorrhea, sneezing and sore throat.   Eyes: Negative for redness.  Respiratory: Negative.  Negative for cough, chest tightness and shortness of breath.   Cardiovascular: Negative.  Negative for chest pain and palpitations.  Gastrointestinal: Negative.  Negative for abdominal pain, constipation, diarrhea, nausea and vomiting.  Endocrine: Negative.   Genitourinary: Negative.  Negative for dysuria and frequency.  Musculoskeletal: Negative.  Negative for arthralgias, back pain, joint swelling and neck pain.  Skin: Negative.  Negative for rash.  Allergic/Immunologic: Negative.   Neurological: Negative.  Negative for tremors and numbness.  Hematological: Negative for adenopathy. Does not bruise/bleed easily.  Psychiatric/Behavioral: Negative.  Negative for behavioral problems, sleep disturbance and suicidal ideas. The patient is not nervous/anxious.     Vital Signs: Ht 5\' 6"  (1.676 m)   Wt 215 lb (97.5 kg)   BMI 34.70 kg/m    Observation/Objective:  Well appearing, NAD noted.    Assessment/Plan: 1. Obstructive chronic bronchitis without exacerbation (HCC) Stable, continue to use oxygen as prescribed.  Continue to use inhalers and nebulizer as needed.  2. Oxygen dependent Continue to use oxygen during the day, and combined with cpap at night.   3. OSA on CPAP Good symptom management, continue to use as prescribed.  Follow up in 4 months.   4. Chronic congestive heart failure, unspecified heart failure type ([place]) Recent valve replacement, continue to see cardiology as directed.   General Counseling: rayn enderson understanding of the findings of today's phone visit and agrees with plan of treatment. I have discussed any further diagnostic evaluation that may be needed or ordered today. We also reviewed his medications today. he has been encouraged to call the office with any questions or  concerns that should arise related to todays visit.    No orders of the defined types were placed in this encounter.   No orders of the defined types were placed in this encounter.   Time spent: 20 Minutes  This patient was seen by Orson Gear AGNP-C in Collaboration with Dr. Devona Konig as a part of collaborative care agreement.   Orson Gear AGNP-C Pulmonary medicine

## 2019-06-04 ENCOUNTER — Telehealth: Payer: Self-pay

## 2019-06-04 NOTE — Telephone Encounter (Signed)
CMN SIGNED AND PLACED IN [employer] PATIENT FOLDER. °

## 2019-08-28 ENCOUNTER — Telehealth: Payer: Self-pay

## 2019-08-28 NOTE — Telephone Encounter (Signed)
CMN signed by provider and placed in [facility] Patient folder.

## 2019-09-05 DIAGNOSIS — N185 Chronic kidney disease, stage 5: Secondary | ICD-10-CM

## 2019-09-29 ENCOUNTER — Ambulatory Visit: Payer: Medicare HMO | Admitting: Internal Medicine

## 2019-09-29 ENCOUNTER — Other Ambulatory Visit: Payer: Self-pay

## 2019-09-29 ENCOUNTER — Encounter: Payer: Self-pay | Admitting: Internal Medicine

## 2019-09-29 VITALS — BP 125/58 | HR 68 | Temp 97.6°F | Resp 16 | Ht 66.0 in | Wt 220.0 lb

## 2019-09-29 DIAGNOSIS — J449 Chronic obstructive pulmonary disease, unspecified: Secondary | ICD-10-CM

## 2019-09-29 DIAGNOSIS — R0602 Shortness of breath: Secondary | ICD-10-CM

## 2019-09-29 DIAGNOSIS — G4733 Obstructive sleep apnea (adult) (pediatric): Secondary | ICD-10-CM | POA: Diagnosis not present

## 2019-09-29 DIAGNOSIS — Z9981 Dependence on supplemental oxygen: Secondary | ICD-10-CM

## 2019-09-29 DIAGNOSIS — Z9989 Dependence on other enabling machines and devices: Secondary | ICD-10-CM

## 2019-09-29 DIAGNOSIS — I509 Heart failure, unspecified: Secondary | ICD-10-CM

## 2019-09-29 NOTE — Progress Notes (Signed)
[facility] [place], [place] [address]  Pulmonary Sleep Medicine   Office Visit Note  Patient Name: [name] [name] DOB: [birth year] MRN [id number]  Date of Service: 09/29/2019  Complaints/HPI: Pt is here for pulmonary follow up.  He reports overall he is doing well.  He has a history of copd, osa, and CHF.  He reports he has good days and bad days.  Today is a good day. Pt reports good compliance with CPAP therapy. Cleaning machine by hand, and changing filters and tubing as directed. Denies headaches, sinus issues, palpitations, or hemoptysis.  He uses duonebs, Pulmicort, and albuterol nebs as needed.  He also has advair inhaler.  He does not use anything regularly. He wears oxygen continually at 3 lpm day and night.       ROS  General: (-) fever, (-) chills, (-) night sweats, (-) weakness Skin: (-) rashes, (-) itching,. Eyes: (-) visual changes, (-) redness, (-) itching. Nose and Sinuses: (-) nasal stuffiness or itchiness, (-) postnasal drip, (-) nosebleeds, (-) sinus trouble. Mouth and Throat: (-) sore throat, (-) hoarseness. Neck: (-) swollen glands, (-) enlarged thyroid, (-) neck pain. Respiratory: - cough, (-) bloody sputum, +shortness of breath, - wheezing. Cardiovascular: - ankle swelling, (-) chest pain. Lymphatic: (-) lymph node enlargement. Neurologic: (-) numbness, (-) tingling. Psychiatric: (-) anxiety, (-) depression   Current Medication: Outpatient Encounter Medications as of 09/29/2019  Medication Sig   acetaminophen (TYLENOL) 325 MG tablet Take 2 tablets (650 mg total) by mouth every 4 (four) hours as needed for mild pain (temp > 101.5).   albuterol (VENTOLIN HFA) 108 (90 Base) MCG/ACT inhaler Inhale into the lungs.   Ascorbic Acid (VITAMIN C) 1000 MG tablet Take 1,000 mg by mouth daily.   aspirin EC 81 MG EC tablet Take 1 tablet (81 mg total) by mouth daily.   budesonide (PULMICORT) 0.5 MG/2ML nebulizer solution Take 2 mLs  (0.5 mg total) by nebulization 2 (two) times daily.   citalopram (CELEXA) 20 MG tablet Take 1 tablet (20 mg total) by mouth daily.   clopidogrel (PLAVIX) 75 MG tablet Take 75 mg by mouth daily.   famotidine (PEPCID) 20 MG tablet Take 1 tablet (20 mg total) by mouth at bedtime.   fenofibrate 160 MG tablet Take 160 mg by mouth daily.   fluticasone (FLONASE) 50 MCG/ACT nasal spray Place 2 sprays into both nostrils daily.   fluticasone-salmeterol (ADVAIR HFA) 115-21 MCG/ACT inhaler Inhale into the lungs.   furosemide (LASIX) 40 MG tablet Take 1 tablet (40 mg total) by mouth 2 (two) times daily.   gabapentin (NEURONTIN) 100 MG capsule Take 2 capsules (200 mg total) by mouth 2 (two) times daily.   heparin 5000 UNIT/ML injection Inject 1 mL (5,000 Units total) into the skin every 8 (eight) hours.   hydrALAZINE (APRESOLINE) 50 MG tablet Take 50 mg by mouth 2 (two) times daily.   insulin aspart (NOVOLOG) 100 UNIT/ML injection Inject 0-9 Units into the skin every 4 (four) hours.   ipratropium-albuterol (DUONEB) 0.5-2.5 (3) MG/3ML SOLN Take 3 mLs by nebulization every 6 (six) hours.   methocarbamol (ROBAXIN) 750 MG tablet Take 750 mg by mouth 4 (four) times daily.   metoprolol succinate (TOPROL-XL) 50 MG 24 hr tablet Take 50 mg by mouth daily. Take with or immediately following a meal.   NON FORMULARY cpap w/o2   OXYGEN Inhale into the lungs continuous.   pantoprazole (PROTONIX) 40 MG tablet Take 40 mg by mouth daily.  Potassium 95 MG TABS Take by mouth.   sodium chloride (OCEAN) 0.65 % SOLN nasal spray Place 1 spray into both nostrils as needed for congestion.   tamsulosin (FLOMAX) 0.4 MG CAPS capsule Take 1 capsule (0.4 mg total) by mouth daily.   torsemide (DEMADEX) 20 MG tablet Take 20 mg by mouth daily.   No facility-administered encounter medications on file as of 09/29/2019.    Surgical History: Past Surgical History:  Procedure Laterality Date   HERNIA REPAIR      LITHOTRIPSY     SUPRAVALVULAR AORTIC STENOSIS REPAIR      Medical History: Past Medical History:  Diagnosis Date   CHF (congestive heart failure) (HCC)    COPD (chronic obstructive pulmonary disease) (HCC)    Diabetes mellitus without complication (HCC)    Hypertension    Kidney stones    Renal disorder     Family History: Family History  Adopted: Yes    Social History: Social History   Socioeconomic History   Marital status: Married    Spouse name: Not on file   Number of children: Not on file   Years of education: Not on file   Highest education level: Not on file  Occupational History   Not on file  Tobacco Use   Smoking status: Former Smoker   Smokeless tobacco: Former Systems developer    Quit date: 05/16/2004  Substance and Sexual Activity   Alcohol use: No   Drug use: Never   Sexual activity: Not on file  Other Topics Concern   Not on file  Social History Narrative   Not on file   Social Determinants of Health   Financial Resource Strain:    Difficulty of Paying Living Expenses:   Food Insecurity:    Worried About Charity fundraiser in the Last Year:    Arboriculturist in the Last Year:   Transportation Needs:    Film/video editor (Medical):    Lack of Transportation (Non-Medical):   Physical Activity:    Days of Exercise per Week:    Minutes of Exercise per Session:   Stress:    Feeling of Stress :   Social Connections:    Frequency of Communication with Friends and Family:    Frequency of Social Gatherings with Friends and Family:    Attends Religious Services:    Active Member of Clubs or Organizations:    Attends Music therapist:    Marital Status:   Intimate Partner Violence:    Fear of Current or Ex-Partner:    Emotionally Abused:    Physically Abused:    Sexually Abused:     Vital Signs: Blood pressure (!) 125/58, pulse 68, temperature 97.6 F (36.4 C), resp. rate 16, height 5\' 6"   (1.676 m), weight 220 lb (99.8 kg), SpO2 94 %.  Examination: General Appearance: The patient is well-developed, well-nourished, and in no distress. Skin: Gross inspection of skin unremarkable. Head: normocephalic, no gross deformities. Eyes: no gross deformities noted. ENT: ears appear grossly normal no exudates. Neck: Supple. No thyromegaly. No LAD. Respiratory: clear bilaterally. Cardiovascular: Normal S1 and S2 without murmur or rub. Extremities: No cyanosis. pulses are equal. Neurologic: Alert and oriented. No involuntary movements.  LABS: No results found for this or any previous visit (from the past 2160 hour(s)).  Radiology: US ARTERIAL LOWER EXTREMITY DUPLEX BILATERAL  Result Date: 03/09/2017 CLINICAL DATA:  Bilateral leg pain.  Coronary artery disease. EXAM: BILATERAL LOWER EXTREMITY ARTERIAL DUPLEX SCAN TECHNIQUE:  Gray-scale sonography as well as color Doppler and duplex ultrasound was performed to evaluate the arteries of both lower extremities including the common, superficial and profunda femoral arteries, popliteal artery and calf arteries. COMPARISON:  CT 05/31/2011 FINDINGS: Right Lower Extremity ABI: Not measured Inflow: Normal common femoral arterial waveforms and velocities. No suggestion of significant inflow (aortoiliac) disease. Outflow: Normal profunda femoral, superficial femoral and popliteal arterial waveforms and velocities. No focal elevation of the PSV to suggest stenosis. Runoff: Normal posterior and anterior tibial arterial waveforms and velocities. No stenosis or segmental occlusion on survey interrogation. Left Lower Extremity ABI: Not measured Inflow: Normal common femoral arterial waveforms and velocities. No suggestion of significant inflow (aortoiliac) disease. Outflow: Normal profunda femoral, superficial femoral and popliteal arterial waveforms and velocities. No focal elevation of the PSV to suggest stenosis. Runoff: Normal posterior and anterior tibial  arterial waveforms and velocities. No stenosis or segmental occlusion on survey interrogation. IMPRESSION: 1. Unremarkable . Consider confirmation with pre and postexercise ABIs if symptoms persist. Electronically Signed   By: Lucrezia Europe M.D.   On: 03/09/2017 09:08    No results found.  No results found.    Assessment and Plan: Patient Active Problem List   Diagnosis Date Noted   Acute renal failure (ARF) ([place])    COPD exacerbation ([place])    Respiratory failure ([place]) 07/05/2016   1. Obstructive chronic bronchitis without exacerbation ([place]) Controlled currently. Continue to use oxygen and medications as directed.   2. Oxygen dependent Stable, Continue to wear oxygen as directed. 3 lpm continually.   3. SOB (shortness of breath) Spiro improved today.  6 minute walk shows oxygen desat to 88%, improved with wearing oxygen up to 91%.  - Spirometry with Graph - 6 minute walk  4. OSA on CPAP Continue to use cpap, daily as directed.   5. Chronic congestive heart failure, unspecified heart failure type ([place]) Continue to see cardiology as follows.    General Counseling: I have discussed the findings of the evaluation and examination with Thayer Jew.  I have also discussed any further diagnostic evaluation thatmay be needed or ordered today. Rylen verbalizes understanding of the findings of todays visit. We also reviewed his medications today and discussed drug interactions and side effects including but not limited excessive drowsiness and altered mental states. We also discussed that there is always a risk not just to him but also people around him. he has been encouraged to call the office with any questions or concerns that should arise related to todays visit.  Orders Placed This Encounter  Procedures   Spirometry with Graph    Order Specific Question:   Where should this test be performed?    Answer:   [facility]   6 minute walk    Order Specific Question:   Where  should this test be performed?    Answer:   other     Time spent: 30 This patient was seen by Orson Gear AGNP-C in Collaboration with Dr. Devona Konig as a part of collaborative care agreement.   I have personally obtained a history, examined the patient, evaluated laboratory and imaging results, formulated the assessment and plan and placed orders.    Allyne Gee, MD [facility] Pulmonary and Critical Care Sleep medicine

## 2019-11-17 LAB — PROTEIN / CREATININE RATIO, URINE
Albumin, U: 150
Creatinine, Urine: 100

## 2019-11-17 LAB — MICROALBUMIN / CREATININE URINE RATIO: Microalb Creat Ratio: >300

## 2020-02-05 ENCOUNTER — Encounter: Payer: Self-pay | Admitting: Internal Medicine

## 2020-02-05 ENCOUNTER — Ambulatory Visit: Payer: Medicare HMO | Admitting: Internal Medicine

## 2020-02-05 ENCOUNTER — Other Ambulatory Visit: Payer: Self-pay

## 2020-02-05 DIAGNOSIS — R0602 Shortness of breath: Secondary | ICD-10-CM | POA: Diagnosis not present

## 2020-02-05 DIAGNOSIS — J4489 Other specified chronic obstructive pulmonary disease: Secondary | ICD-10-CM

## 2020-02-05 DIAGNOSIS — G4733 Obstructive sleep apnea (adult) (pediatric): Secondary | ICD-10-CM

## 2020-02-05 DIAGNOSIS — Z9981 Dependence on supplemental oxygen: Secondary | ICD-10-CM

## 2020-02-05 DIAGNOSIS — I509 Heart failure, unspecified: Secondary | ICD-10-CM

## 2020-02-05 DIAGNOSIS — Z9989 Dependence on other enabling machines and devices: Secondary | ICD-10-CM

## 2020-02-05 DIAGNOSIS — J449 Chronic obstructive pulmonary disease, unspecified: Secondary | ICD-10-CM | POA: Diagnosis not present

## 2020-02-05 NOTE — Progress Notes (Signed)
[facility] [place], [place] [address]  Pulmonary Sleep Medicine   Office Visit Note  Patient Name: [name] [name] DOB: [birth year] MRN [id number]  Date of Service: 02/09/2020  Complaints/HPI:  Patient is here for routine pulmonary follow-up He says that his breathing has been doing fair, at his baseline He continues to wear 3LPM via [place] at all times He also wears CPAP at night with good compliance and bleeds his supplemental O2 into his CPAP Cleans his machine by hand and changes out tubing and filters as indicated He reports using his Advair daily, does not require his rescue albuterol inhaler often and does use his nebulizer machine at least once daily CHF well controlled--followed closely by cardiology No recent exacerbations or hospitalizations  ROS  General: (-) fever, (-) chills, (-) night sweats, (-) weakness Skin: (-) rashes, (-) itching,. Eyes: (-) visual changes, (-) redness, (-) itching. Nose and Sinuses: (-) nasal stuffiness or itchiness, (-) postnasal drip, (-) nosebleeds, (-) sinus trouble. Mouth and Throat: (-) sore throat, (-) hoarseness. Neck: (-) swollen glands, (-) enlarged thyroid, (-) neck pain. Respiratory: + cough, (-) bloody sputum, + shortness of breath, - wheezing. Cardiovascular: - ankle swelling, (-) chest pain. Lymphatic: (-) lymph node enlargement. Neurologic: (-) numbness, (-) tingling. Psychiatric: (-) anxiety, (-) depression   Current Medication: Outpatient Encounter Medications as of 02/05/2020  Medication Sig  . acetaminophen (TYLENOL) 325 MG tablet Take 2 tablets (650 mg total) by mouth every 4 (four) hours as needed for mild pain (temp > 101.5).  Marland Kitchen albuterol (VENTOLIN HFA) 108 (90 Base) MCG/ACT inhaler Inhale into the lungs.  . Ascorbic Acid (VITAMIN C) 1000 MG tablet Take 1,000 mg by mouth daily.  Marland Kitchen aspirin EC 81 MG EC tablet Take 1 tablet (81 mg total) by mouth daily.  . budesonide (PULMICORT) 0.5  MG/2ML nebulizer solution Take 2 mLs (0.5 mg total) by nebulization 2 (two) times daily.  . citalopram (CELEXA) 20 MG tablet Take 1 tablet (20 mg total) by mouth daily.  . fluticasone (FLONASE) 50 MCG/ACT nasal spray Place 2 sprays into both nostrils daily.  . fluticasone-salmeterol (ADVAIR HFA) 115-21 MCG/ACT inhaler Inhale into the lungs.  . furosemide (LASIX) 40 MG tablet Take 1 tablet (40 mg total) by mouth 2 (two) times daily.  Marland Kitchen gabapentin (NEURONTIN) 100 MG capsule Take 2 capsules (200 mg total) by mouth 2 (two) times daily.  . hydrALAZINE (APRESOLINE) 50 MG tablet Take 50 mg by mouth 2 (two) times daily.  Marland Kitchen ipratropium-albuterol (DUONEB) 0.5-2.5 (3) MG/3ML SOLN Take 3 mLs by nebulization every 6 (six) hours.  . methocarbamol (ROBAXIN) 750 MG tablet Take 750 mg by mouth 4 (four) times daily.  . metoprolol succinate (TOPROL-XL) 50 MG 24 hr tablet Take 50 mg by mouth daily. Take with or immediately following a meal.  . NON FORMULARY cpap w/o2  . OXYGEN Inhale into the lungs continuous.  . pantoprazole (PROTONIX) 40 MG tablet Take 40 mg by mouth daily.  . potassium chloride (KLOR-CON) 10 MEQ tablet Take 10 mEq by mouth daily.  . sodium chloride (OCEAN) 0.65 % SOLN nasal spray Place 1 spray into both nostrils as needed for congestion.  . tamsulosin (FLOMAX) 0.4 MG CAPS capsule Take 1 capsule (0.4 mg total) by mouth daily.  Marland Kitchen torsemide (DEMADEX) 20 MG tablet Take 20 mg by mouth daily.  . [DISCONTINUED] clopidogrel (PLAVIX) 75 MG tablet Take 75 mg by mouth daily. (Patient not taking: Reported on 02/05/2020)  . [DISCONTINUED] famotidine (  PEPCID) 20 MG tablet Take 1 tablet (20 mg total) by mouth at bedtime. (Patient not taking: Reported on 02/05/2020)  . [DISCONTINUED] fenofibrate 160 MG tablet Take 160 mg by mouth daily. (Patient not taking: Reported on 02/05/2020)  . [DISCONTINUED] heparin 5000 UNIT/ML injection Inject 1 mL (5,000 Units total) into the skin every 8 (eight) hours. (Patient not  taking: Reported on 02/05/2020)  . [DISCONTINUED] insulin aspart (NOVOLOG) 100 UNIT/ML injection Inject 0-9 Units into the skin every 4 (four) hours. (Patient not taking: Reported on 02/05/2020)  . [DISCONTINUED] Potassium 95 MG TABS Take by mouth. (Patient not taking: Reported on 02/05/2020)   No facility-administered encounter medications on file as of 02/05/2020.    Surgical History: Past Surgical History:  Procedure Laterality Date  . HERNIA REPAIR    . LITHOTRIPSY    . SUPRAVALVULAR AORTIC STENOSIS REPAIR      Medical History: Past Medical History:  Diagnosis Date  . CHF (congestive heart failure) ([place])   . COPD (chronic obstructive pulmonary disease) ([place])   . Diabetes mellitus without complication ([place])   . Hypertension   . Kidney stones   . Renal disorder     Family History: Family History  Adopted: Yes    Social History: Social History   Socioeconomic History  . Marital status: Married    Spouse name: Not on file  . Number of children: Not on file  . Years of education: Not on file  . Highest education level: Not on file  Occupational History  . Not on file  Tobacco Use  . Smoking status: Former Research scientist (life sciences)  . Smokeless tobacco: Former Systems developer    Quit date: 05/16/2004  Vaping Use  . Vaping Use: Never used  Substance and Sexual Activity  . Alcohol use: No  . Drug use: Never  . Sexual activity: Not on file  Other Topics Concern  . Not on file  Social History Narrative  . Not on file   Social Determinants of Health   Financial Resource Strain:   . Difficulty of Paying Living Expenses: Not on file  Food Insecurity:   . Worried About Charity fundraiser in the Last Year: Not on file  . Ran Out of Food in the Last Year: Not on file  Transportation Needs:   . Lack of Transportation (Medical): Not on file  . Lack of Transportation (Non-Medical): Not on file  Physical Activity:   . Days of Exercise per Week: Not on file  . Minutes of Exercise per Session:  Not on file  Stress:   . Feeling of Stress : Not on file  Social Connections:   . Frequency of Communication with Friends and Family: Not on file  . Frequency of Social Gatherings with Friends and Family: Not on file  . Attends Religious Services: Not on file  . Active Member of Clubs or Organizations: Not on file  . Attends Archivist Meetings: Not on file  . Marital Status: Not on file  Intimate Partner Violence:   . Fear of Current or Ex-Partner: Not on file  . Emotionally Abused: Not on file  . Physically Abused: Not on file  . Sexually Abused: Not on file    Vital Signs: Blood pressure (!) 152/76, pulse 62, temperature 97.6 F (36.4 C), resp. rate 16, height 5\' 4"  (1.626 m), weight 217 lb 9.6 oz (98.7 kg), SpO2 95 %.  Examination: General Appearance: The patient is well-developed, well-nourished, and in no distress. Skin: Gross inspection of  skin unremarkable. Head: normocephalic, no gross deformities. Eyes: no gross deformities noted. ENT: ears appear grossly normal no exudates. Neck: Supple. No thyromegaly. No LAD. Respiratory: Diminished wheezing prominent in bilateral bases, no rhonchi or rales noted. Cardiovascular: Normal S1 and S2 without murmur or rub. Extremities: No cyanosis. pulses are equal. Neurologic: Alert and oriented. No involuntary movements.  LABS: No results found for this or any previous visit (from the past 2160 hour(s)).  Radiology: US ARTERIAL LOWER EXTREMITY DUPLEX BILATERAL  Result Date: 03/09/2017 CLINICAL DATA:  Bilateral leg pain.  Coronary artery disease. EXAM: BILATERAL LOWER EXTREMITY ARTERIAL DUPLEX SCAN TECHNIQUE: Gray-scale sonography as well as color Doppler and duplex ultrasound was performed to evaluate the arteries of both lower extremities including the common, superficial and profunda femoral arteries, popliteal artery and calf arteries. COMPARISON:  CT 05/31/2011 FINDINGS: Right Lower Extremity ABI: Not measured Inflow:  Normal common femoral arterial waveforms and velocities. No suggestion of significant inflow (aortoiliac) disease. Outflow: Normal profunda femoral, superficial femoral and popliteal arterial waveforms and velocities. No focal elevation of the PSV to suggest stenosis. Runoff: Normal posterior and anterior tibial arterial waveforms and velocities. No stenosis or segmental occlusion on survey interrogation. Left Lower Extremity ABI: Not measured Inflow: Normal common femoral arterial waveforms and velocities. No suggestion of significant inflow (aortoiliac) disease. Outflow: Normal profunda femoral, superficial femoral and popliteal arterial waveforms and velocities. No focal elevation of the PSV to suggest stenosis. Runoff: Normal posterior and anterior tibial arterial waveforms and velocities. No stenosis or segmental occlusion on survey interrogation. IMPRESSION: 1. Unremarkable . Consider confirmation with pre and postexercise ABIs if symptoms persist. Electronically Signed   By: Lucrezia Europe M.D.   On: 03/09/2017 09:08   Assessment and Plan: Patient Active Problem List   Diagnosis Date Noted  . Acute renal failure (ARF) ([place])   . COPD exacerbation ([place])   . Respiratory failure ([place]) 07/05/2016    1. OSA on CPAP Continue with nightly compliance with CPAP, will schedule download to assess AHI is well controlled--will make adjustments to pressures as indicated  2. Obstructive chronic bronchitis without exacerbation ([place]) Will obtain updated PFT, continue with Advair as well as nebulizer therapy May need to consider updating CXR at next visit--last 2018 - Pulmonary function test; Future  3. Supplemental oxygen dependent Continue with 3LPM via [place] at this time as SpO2 levels remain stable  4. Chronic congestive heart failure, unspecified heart failure type ([place]) Stable at this time, no evidence of fluid overload--continue to follow-up with cardiology as indicated  5. SOB (shortness of  breath) FEV1 1.7L which is 72% of predicted, remains stable at this time, will compare to updated PFT - Spirometry with Graph  General Counseling: I have discussed the findings of the evaluation and examination with Thayer Jew.  I have also discussed any further diagnostic evaluation thatmay be needed or ordered today. Murrel verbalizes understanding of the findings of todays visit. We also reviewed his medications today and discussed drug interactions and side effects including but not limited excessive drowsiness and altered mental states. We also discussed that there is always a risk not just to him but also people around him. he has been encouraged to call the office with any questions or concerns that should arise related to todays visit.  Orders Placed This Encounter  Procedures  . Spirometry with Graph    Order Specific Question:   Where should this test be performed?    Answer:   [facility]    Order  Specific Question:   Basic spirometry    Answer:   Yes  . Pulmonary function test    Standing Status:   Future    Standing Expiration Date:   02/04/2021    Order Specific Question:   Where should this test be performed?    Answer:   [facility]     Time spent: 41  I have personally obtained a history, examined the patient, evaluated laboratory and imaging results, formulated the assessment and plan and placed orders. This patient was seen by Casey Burkitt AGNP-C in Collaboration with Dr. Devona Konig as a part of collaborative care agreement.    Allyne Gee, MD [facility] Pulmonary and Critical Care Sleep medicine

## 2020-02-09 ENCOUNTER — Encounter: Payer: Self-pay | Admitting: Internal Medicine

## 2020-02-09 NOTE — Patient Instructions (Signed)

## 2020-04-28 ENCOUNTER — Ambulatory Visit (INDEPENDENT_AMBULATORY_CARE_PROVIDER_SITE_OTHER): Payer: Medicare HMO

## 2020-04-28 ENCOUNTER — Other Ambulatory Visit: Payer: Self-pay

## 2020-04-28 DIAGNOSIS — G4733 Obstructive sleep apnea (adult) (pediatric): Secondary | ICD-10-CM | POA: Diagnosis not present

## 2020-04-28 NOTE — Progress Notes (Signed)
95 percentile pressure 13.9   95th percentile leak 32.8   apnea index 0.2 /hr  apnea-hypopnea index  0.6 /hr   total days used  >4 hr 90 days  total days used <4 hr 0 days  Total compliance 100 percent  He is doing great no problems or questions at this time

## 2020-05-10 ENCOUNTER — Ambulatory Visit: Payer: Medicare HMO | Admitting: Hospice and Palliative Medicine

## 2020-05-11 ENCOUNTER — Ambulatory Visit: Payer: Medicare HMO | Admitting: Hospice and Palliative Medicine

## 2020-07-23 DIAGNOSIS — J189 Pneumonia, unspecified organism: Secondary | ICD-10-CM

## 2020-07-23 HISTORY — DX: Pneumonia, unspecified organism: J18.9

## 2020-07-26 ENCOUNTER — Inpatient Hospital Stay
Admission: AD | Admit: 2020-07-26 | Discharge: 2020-07-30 | DRG: 190 | Disposition: A | Discharge status: Home / Self Care | Payer: Medicare HMO | Attending: Internal Medicine | Admitting: Internal Medicine

## 2020-07-26 ENCOUNTER — Other Ambulatory Visit: Payer: Self-pay

## 2020-07-26 ENCOUNTER — Encounter: Payer: Self-pay | Admitting: Internal Medicine

## 2020-07-26 ENCOUNTER — Observation Stay: Payer: Medicare HMO

## 2020-07-26 DIAGNOSIS — J189 Pneumonia, unspecified organism: Secondary | ICD-10-CM | POA: Diagnosis present

## 2020-07-26 DIAGNOSIS — I130 Hypertensive heart and chronic kidney disease with heart failure and stage 1 through stage 4 chronic kidney disease, or unspecified chronic kidney disease: Secondary | ICD-10-CM | POA: Diagnosis present

## 2020-07-26 DIAGNOSIS — J9611 Chronic respiratory failure with hypoxia: Secondary | ICD-10-CM

## 2020-07-26 DIAGNOSIS — Z87442 Personal history of urinary calculi: Secondary | ICD-10-CM | POA: Diagnosis not present

## 2020-07-26 DIAGNOSIS — Z20822 Contact with and (suspected) exposure to covid-19: Secondary | ICD-10-CM | POA: Diagnosis present

## 2020-07-26 DIAGNOSIS — Z79899 Other long term (current) drug therapy: Secondary | ICD-10-CM

## 2020-07-26 DIAGNOSIS — Z888 Allergy status to other drugs, medicaments and biological substances status: Secondary | ICD-10-CM

## 2020-07-26 DIAGNOSIS — Z87891 Personal history of nicotine dependence: Secondary | ICD-10-CM | POA: Diagnosis not present

## 2020-07-26 DIAGNOSIS — N184 Chronic kidney disease, stage 4 (severe): Secondary | ICD-10-CM | POA: Diagnosis present

## 2020-07-26 DIAGNOSIS — M109 Gout, unspecified: Secondary | ICD-10-CM | POA: Diagnosis present

## 2020-07-26 DIAGNOSIS — E785 Hyperlipidemia, unspecified: Secondary | ICD-10-CM | POA: Diagnosis present

## 2020-07-26 DIAGNOSIS — N185 Chronic kidney disease, stage 5: Secondary | ICD-10-CM

## 2020-07-26 DIAGNOSIS — I10 Essential (primary) hypertension: Secondary | ICD-10-CM | POA: Diagnosis not present

## 2020-07-26 DIAGNOSIS — N179 Acute kidney failure, unspecified: Secondary | ICD-10-CM

## 2020-07-26 DIAGNOSIS — E1122 Type 2 diabetes mellitus with diabetic chronic kidney disease: Secondary | ICD-10-CM | POA: Diagnosis present

## 2020-07-26 DIAGNOSIS — I5020 Unspecified systolic (congestive) heart failure: Secondary | ICD-10-CM

## 2020-07-26 DIAGNOSIS — R0602 Shortness of breath: Secondary | ICD-10-CM

## 2020-07-26 DIAGNOSIS — D631 Anemia in chronic kidney disease: Secondary | ICD-10-CM | POA: Diagnosis present

## 2020-07-26 DIAGNOSIS — K219 Gastro-esophageal reflux disease without esophagitis: Secondary | ICD-10-CM | POA: Diagnosis present

## 2020-07-26 DIAGNOSIS — I2510 Atherosclerotic heart disease of native coronary artery without angina pectoris: Secondary | ICD-10-CM | POA: Diagnosis present

## 2020-07-26 DIAGNOSIS — Z7951 Long term (current) use of inhaled steroids: Secondary | ICD-10-CM

## 2020-07-26 DIAGNOSIS — Z886 Allergy status to analgesic agent status: Secondary | ICD-10-CM | POA: Diagnosis not present

## 2020-07-26 DIAGNOSIS — I5042 Chronic combined systolic (congestive) and diastolic (congestive) heart failure: Secondary | ICD-10-CM

## 2020-07-26 DIAGNOSIS — E875 Hyperkalemia: Secondary | ICD-10-CM

## 2020-07-26 DIAGNOSIS — T39395A Adverse effect of other nonsteroidal anti-inflammatory drugs [NSAID], initial encounter: Secondary | ICD-10-CM | POA: Diagnosis present

## 2020-07-26 DIAGNOSIS — Z7982 Long term (current) use of aspirin: Secondary | ICD-10-CM

## 2020-07-26 DIAGNOSIS — G4733 Obstructive sleep apnea (adult) (pediatric): Secondary | ICD-10-CM

## 2020-07-26 DIAGNOSIS — Z9981 Dependence on supplemental oxygen: Secondary | ICD-10-CM

## 2020-07-26 DIAGNOSIS — J440 Chronic obstructive pulmonary disease with acute lower respiratory infection: Secondary | ICD-10-CM | POA: Diagnosis present

## 2020-07-26 DIAGNOSIS — E119 Type 2 diabetes mellitus without complications: Secondary | ICD-10-CM

## 2020-07-26 DIAGNOSIS — N400 Enlarged prostate without lower urinary tract symptoms: Secondary | ICD-10-CM

## 2020-07-26 HISTORY — DX: Pneumonia, unspecified organism: J18.9

## 2020-07-26 LAB — BASIC METABOLIC PANEL
Anion gap: 10 (ref 5–15)
BUN: 70 mg/dL — ABNORMAL HIGH (ref 8–23)
CO2: 21 mmol/L — ABNORMAL LOW (ref 22–32)
Calcium: 9.2 mg/dL (ref 8.9–10.3)
Chloride: 107 mmol/L (ref 98–111)
Creatinine, Ser: 4.43 mg/dL — ABNORMAL HIGH (ref 0.61–1.24)
GFR, Estimated: 13 mL/min — ABNORMAL LOW (ref >60)
Glucose, Bld: 128 mg/dL — ABNORMAL HIGH (ref 70–99)
Potassium: 4.8 mmol/L (ref 3.5–5.1)
Sodium: 138 mmol/L (ref 135–145)

## 2020-07-26 LAB — CBC WITH DIFFERENTIAL/PLATELET
Abs Immature Granulocytes: 0.11 10*3/uL — ABNORMAL HIGH (ref 0.00–0.07)
Basophils Absolute: 0 10*3/uL (ref 0.0–0.1)
Basophils Relative: 0 %
Eosinophils Absolute: 0.2 10*3/uL (ref 0.0–0.5)
Eosinophils Relative: 2 %
HCT: 30.4 % — ABNORMAL LOW (ref 39.0–52.0)
Hemoglobin: 10.4 g/dL — ABNORMAL LOW (ref 13.0–17.0)
Immature Granulocytes: 1 %
Lymphocytes Relative: 14 %
Lymphs Abs: 1.2 10*3/uL (ref 0.7–4.0)
MCH: 33 pg (ref 26.0–34.0)
MCHC: 34.2 g/dL (ref 30.0–36.0)
MCV: 96.5 fL (ref 80.0–100.0)
Monocytes Absolute: 0.8 10*3/uL (ref 0.1–1.0)
Monocytes Relative: 10 %
Neutro Abs: 5.8 10*3/uL (ref 1.7–7.7)
Neutrophils Relative %: 73 %
Platelets: 214 10*3/uL (ref 150–400)
RBC: 3.15 MIL/uL — ABNORMAL LOW (ref 4.22–5.81)
RDW: 13 % (ref 11.5–15.5)
WBC: 8.1 10*3/uL (ref 4.0–10.5)
nRBC: 0 % (ref 0.0–0.2)

## 2020-07-26 LAB — URINALYSIS, ROUTINE W REFLEX MICROSCOPIC
Bacteria, UA: NONE SEEN
Bilirubin Urine: NEGATIVE
Glucose, UA: NEGATIVE mg/dL
Hgb urine dipstick: NEGATIVE
Ketones, ur: NEGATIVE mg/dL
Leukocytes,Ua: NEGATIVE
Nitrite: NEGATIVE
Protein, ur: 100 mg/dL — AB
Specific Gravity, Urine: 1.015 (ref 1.005–1.030)
Squamous Epithelial / HPF: NONE SEEN (ref 0–5)
pH: 5 (ref 5.0–8.0)

## 2020-07-26 LAB — HIV ANTIBODY (ROUTINE TESTING W REFLEX): HIV Screen 4th Generation wRfx: NONREACTIVE

## 2020-07-26 LAB — IRON AND TIBC
Iron: 17 ug/dL — ABNORMAL LOW (ref 45–182)
Saturation Ratios: 10 % — ABNORMAL LOW (ref 17.9–39.5)
TIBC: 165 ug/dL — ABNORMAL LOW (ref 250–450)
UIBC: 148 ug/dL

## 2020-07-26 LAB — RESPIRATORY PANEL BY PCR

## 2020-07-26 LAB — C-REACTIVE PROTEIN: CRP: 32.2 mg/dL — ABNORMAL HIGH (ref <1.0)

## 2020-07-26 LAB — RETICULOCYTES
Immature Retic Fract: 14 % (ref 2.3–15.9)
RBC.: 3.18 MIL/uL — ABNORMAL LOW (ref 4.22–5.81)
Retic Count, Absolute: 34.3 10*3/uL (ref 19.0–186.0)
Retic Ct Pct: 1.1 % (ref 0.4–3.1)

## 2020-07-26 LAB — LACTIC ACID, PLASMA
Lactic Acid, Venous: 1 mmol/L (ref 0.5–1.9)
Lactic Acid, Venous: 0.8 mmol/L (ref 0.5–1.9)

## 2020-07-26 LAB — FERRITIN: Ferritin: 589 ng/mL — ABNORMAL HIGH (ref 24–336)

## 2020-07-26 LAB — TSH: TSH: 3.052 u[IU]/mL (ref 0.350–4.500)

## 2020-07-26 LAB — FOLATE: Folate: 20.1 ng/mL (ref >5.9)

## 2020-07-26 LAB — EXPECTORATED SPUTUM ASSESSMENT W GRAM STAIN, RFLX TO RESP C

## 2020-07-26 LAB — HEMOGLOBIN A1C
Hgb A1c MFr Bld: 5.8 % — ABNORMAL HIGH (ref 4.8–5.6)
Mean Plasma Glucose: 119.76 mg/dL

## 2020-07-26 LAB — SEDIMENTATION RATE: Sed Rate: 107 mm/hr — ABNORMAL HIGH (ref 0–20)

## 2020-07-26 LAB — MRSA PCR SCREENING: MRSA by PCR: NEGATIVE

## 2020-07-26 LAB — GLUCOSE, CAPILLARY
Glucose-Capillary: 134 mg/dL — ABNORMAL HIGH (ref 70–99)
Glucose-Capillary: 160 mg/dL — ABNORMAL HIGH (ref 70–99)

## 2020-07-26 LAB — BRAIN NATRIURETIC PEPTIDE: B Natriuretic Peptide: 85.4 pg/mL (ref 0.0–100.0)

## 2020-07-26 LAB — PROCALCITONIN: Procalcitonin: 1.03 ng/mL

## 2020-07-26 MED ORDER — SODIUM CHLORIDE 0.9 % IV SOLN
500.0000 mg | Freq: Every day | INTRAVENOUS | Status: DC
  Administered 2020-07-26 – 2020-07-27 (×2): 500 mg via INTRAVENOUS
  Filled 2020-07-26 (×2): qty 500

## 2020-07-26 MED ORDER — SODIUM CHLORIDE 0.9 % IV SOLN
2.0000 g | Freq: Every day | INTRAVENOUS | Status: AC
  Administered 2020-07-26 – 2020-07-30 (×5): 2 g via INTRAVENOUS
  Filled 2020-07-26 (×3): qty 2
  Filled 2020-07-26 (×2): qty 20

## 2020-07-26 MED ORDER — INSULIN ASPART 100 UNIT/ML ~~LOC~~ SOLN
3.0000 [IU] | Freq: Three times a day (TID) | SUBCUTANEOUS | Status: DC
  Administered 2020-07-26 – 2020-07-28 (×6): 3 [IU] via SUBCUTANEOUS
  Filled 2020-07-26 (×6): qty 1

## 2020-07-26 MED ORDER — HEPARIN SODIUM (PORCINE) 5000 UNIT/ML IJ SOLN
5000.0000 [IU] | Freq: Three times a day (TID) | INTRAMUSCULAR | Status: DC
  Administered 2020-07-26 – 2020-07-30 (×12): 5000 [IU] via SUBCUTANEOUS
  Filled 2020-07-26 (×12): qty 1

## 2020-07-26 MED ORDER — INSULIN ASPART 100 UNIT/ML ~~LOC~~ SOLN
0.0000 [IU] | Freq: Three times a day (TID) | SUBCUTANEOUS | Status: DC
  Administered 2020-07-27: 1 [IU] via SUBCUTANEOUS
  Filled 2020-07-26: qty 1

## 2020-07-26 NOTE — H&P (Addendum)
History and Physical    [name] [name] KZS:[id number] DOB: [birth year] DOA: 07/26/2020  PCP: Perrin Maltese, MD  Patient coming from: home  I have personally briefly reviewed patient's old medical records in [place]  Chief Complaint: PNA  HPI: [name] [name] is a 75 y.o. male with medical history significant of CAD,COPD on chronic O2,DMII,HLD, HTN,OSA ,Gout,GERD,BPH who presents as direct admit from PCP office due to failure of outpatient treatment of Pneumonia. On arrival to floor patient noted to have  Stable vitals saturations of 97% on 3 L [place] chronic O2.Patient notes that he has been ill for the last two weeks with increase sob cough with tan brown sputum. He notes no associated chest pain, n/v/diarrhea but noted abdominal distention and increase indigestion. He note note palpitations or presyncope but has malaise and fatigue. He notes that he was place on trial of antibiotics but due to persistent symptoms and increasing wbc he was referred for admission.  He noted fever at initial onset as well as chills.  ED Course:  NA  Labs: Wbc:8.1, hgb 10.4 ,mcv96.5 Cxr:+ infiltrate UA:neg Na138, K 4.8, cr 4.43 up form 2.91  a1c5.8  Review of Systems: As per HPI otherwise 10 point review of systems negative.   Past Medical History:  Diagnosis Date  . CHF (congestive heart failure) ([place])   . COPD (chronic obstructive pulmonary disease) ([place])   . Diabetes mellitus without complication ([place])   . Hypertension   . Kidney stones   . Renal disorder     Past Surgical History:  Procedure Laterality Date  . HERNIA REPAIR    . LITHOTRIPSY    . SUPRAVALVULAR AORTIC STENOSIS REPAIR       reports that he has quit smoking. He quit smokeless tobacco use about 16 years ago. He reports that he does not drink alcohol and does not use drugs.  No Known Allergies  Family History  Adopted: Yes    Prior to Admission medications   Medication Sig Start Date End Date  Taking? Authorizing Provider  acetaminophen (TYLENOL) 325 MG tablet Take 2 tablets (650 mg total) by mouth every 4 (four) hours as needed for mild pain (temp > 101.5). 07/11/16   Awilda Bill, NP  albuterol (VENTOLIN HFA) 108 (90 Base) MCG/ACT inhaler Inhale into the lungs. 07/08/14   [provider]  Ascorbic Acid (VITAMIN C) 1000 MG tablet Take 1,000 mg by mouth daily.    [provider]  aspirin EC 81 MG EC tablet Take 1 tablet (81 mg total) by mouth daily. 07/11/16   Awilda Bill, NP  budesonide (PULMICORT) 0.5 MG/2ML nebulizer solution Take 2 mLs (0.5 mg total) by nebulization 2 (two) times daily. 07/11/16   Awilda Bill, NP  citalopram (CELEXA) 20 MG tablet Take 1 tablet (20 mg total) by mouth daily. 07/11/16   Awilda Bill, NP  fluticasone (FLONASE) 50 MCG/ACT nasal spray Place 2 sprays into both nostrils daily. 07/11/16   Awilda Bill, NP  fluticasone-salmeterol (ADVAIR HFA) 732-20 MCG/ACT inhaler Inhale into the lungs.    [provider]  furosemide (LASIX) 40 MG tablet Take 1 tablet (40 mg total) by mouth 2 (two) times daily. 07/11/16   Awilda Bill, NP  gabapentin (NEURONTIN) 100 MG capsule Take 2 capsules (200 mg total) by mouth 2 (two) times daily. 07/11/16   Awilda Bill, NP  hydrALAZINE (APRESOLINE) 50 MG tablet Take 50 mg by mouth 2 (two) times daily.  [provider]  ipratropium-albuterol (DUONEB) 0.5-2.5 (3) MG/3ML SOLN Take 3 mLs by nebulization every 6 (six) hours. 07/11/16   Awilda Bill, NP  methocarbamol (ROBAXIN) 750 MG tablet Take 750 mg by mouth 4 (four) times daily.    [provider]  metoprolol succinate (TOPROL-XL) 50 MG 24 hr tablet Take 50 mg by mouth daily. Take with or immediately following a meal.    [provider]  NON FORMULARY cpap w/o2    [provider]  OXYGEN Inhale into the lungs continuous.    [provider]  pantoprazole (PROTONIX) 40 MG tablet Take 40 mg  by mouth daily.    [provider]  potassium chloride (KLOR-CON) 10 MEQ tablet Take 10 mEq by mouth daily.    [provider]  sodium chloride (OCEAN) 0.65 % SOLN nasal spray Place 1 spray into both nostrils as needed for congestion. 07/11/16   Awilda Bill, NP  tamsulosin (FLOMAX) 0.4 MG CAPS capsule Take 1 capsule (0.4 mg total) by mouth daily. 01/14/18   Festus Aloe, MD  torsemide (DEMADEX) 20 MG tablet Take 20 mg by mouth daily.    [provider]    Physical Exam: Vitals:   07/26/20 1309  BP: (!) 128/51  Pulse: 74  Resp: 20  Temp: 97.6 F (36.4 C)  TempSrc: Oral  SpO2: 97%     Vitals:   07/26/20 1309  BP: (!) 128/51  Pulse: 74  Resp: 20  Temp: 97.6 F (36.4 C)  TempSrc: Oral  SpO2: 97%  Constitutional: NAD, calm, comfortable Eyes: PERRL, lids and conjunctivae normal ENMT: Mucous membranes are moist. Posterior pharynx clear of any exudate or lesions.Normal dentition.  Neck: normal, supple, no masses, no thyromegaly Respiratory: course bilaterally, no wheezing, no crackles. Normal respiratory effort. No accessory muscle use.  Cardiovascular: Regular rate and rhythm, no murmurs / rubs / gallops. No extremity edema. 2+ pedal pulses. No carotid bruits.  Abdomen: no tenderness, no masses palpated. No hepatosplenomegaly. Bowel sounds positive.  Musculoskeletal: no clubbing / cyanosis. No joint deformity upper and lower extremities. Good ROM, no contractures. Normal muscle tone.  Skin: no rashes, lesions, ulcers. No induration Neurologic: CN 2-12 grossly intact. Sensation intact, . Strength 5/5 in all 4.  Psychiatric: Normal judgment and insight. Alert and oriented x 3. Normal mood.    Labs on Admission: I have personally reviewed following labs and imaging studies  CBC: No results for input(s): WBC, NEUTROABS, HGB, HCT, MCV, PLT in the last 168 hours. Basic Metabolic Panel: No results for input(s): NA, K, CL, CO2, GLUCOSE, BUN,  CREATININE, CALCIUM, MG, PHOS in the last 168 hours. GFR: CrCl cannot be calculated (Patient's most recent lab result is older than the maximum 21 days allowed.). Liver Function Tests: No results for input(s): AST, ALT, ALKPHOS, BILITOT, PROT, ALBUMIN in the last 168 hours. No results for input(s): LIPASE, AMYLASE in the last 168 hours. No results for input(s): AMMONIA in the last 168 hours. Coagulation Profile: No results for input(s): INR, PROTIME in the last 168 hours. Cardiac Enzymes: No results for input(s): CKTOTAL, CKMB, CKMBINDEX, TROPONINI in the last 168 hours. BNP (last 3 results) No results for input(s): PROBNP in the last 8760 hours. HbA1C: No results for input(s): HGBA1C in the last 72 hours. CBG: No results for input(s): GLUCAP in the last 168 hours. Lipid Profile: No results for input(s): CHOL, HDL, LDLCALC, TRIG, CHOLHDL, LDLDIRECT in the last 72 hours. Thyroid Function Tests: No results for input(s):  TSH, T4TOTAL, FREET4, T3FREE, THYROIDAB in the last 72 hours. Anemia Panel: No results for input(s): VITAMINB12, FOLATE, FERRITIN, TIBC, IRON, RETICCTPCT in the last 72 hours. Urine analysis:    Component Value Date/Time   COLORURINE AMBER (A) 07/24/2016 2146   APPEARANCEUR CLOUDY (A) 07/24/2016 2146   APPEARANCEUR Clear 07/27/2014 1247   LABSPEC 1.013 07/24/2016 2146   LABSPEC 1.016 07/27/2014 1247   PHURINE 5.0 07/24/2016 2146   GLUCOSEU NEGATIVE 07/24/2016 2146   GLUCOSEU 50 mg/dL 07/27/2014 1247   HGBUR NEGATIVE 07/24/2016 2146   BILIRUBINUR NEGATIVE 07/24/2016 2146   BILIRUBINUR Negative 07/27/2014 [place] 07/24/2016 2146   PROTEINUR 100 (A) 07/24/2016 2146   NITRITE NEGATIVE 07/24/2016 2146   LEUKOCYTESUR NEGATIVE 07/24/2016 2146   LEUKOCYTESUR Negative 07/27/2014 1247    Radiological Exams on Admission: No results found.  EKG: Independently reviewed. ekg pending WVP:[id number] with mild, diffuse interstitial pulmonary  opacity, likely edema. No focal airspace opacity. 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 -last cr 3.07,now 4.4 -hold nephrotoxic medications  - gently ivfs overnight   HTN essential -stable  -hold losartan/furosemide resume once renal function stable -continue metoprolol,tamsulosin as noted per prior renal note  DMII -non-insulin dependent  -place on fs -a1c 5.8 -diet controlled   Gout -resume allopurinol if renal function stable   Anemia of chronic disease -hgb 10, continue to monitor  -no acute bleeding   CAD -no active cardiac complaints  -ekg pending    OSA -resume home CPAP qhs   COPD -on chronic O2 at 3L  CHF  -due to valvular dysfunction s/p aortic valve repair  -bnp 85 -holding lasix due to aki   GERD -ppi  BPH -tamsulosin    DVT prophylaxis: heparin Code Status: Full Family Communication: n/a Disposition Plan: patient  expected to be admitted greater than 2 midnights Consults called:  N/a Admission status: inpatient   Clance Boll MD [facility]  If 7PM-7AM, please contact night-coverage [email or web address] Password [email or web address]  07/26/2020, 1:36 PM

## 2020-07-27 DIAGNOSIS — J9611 Chronic respiratory failure with hypoxia: Secondary | ICD-10-CM

## 2020-07-27 DIAGNOSIS — N179 Acute kidney failure, unspecified: Secondary | ICD-10-CM

## 2020-07-27 DIAGNOSIS — N184 Chronic kidney disease, stage 4 (severe): Secondary | ICD-10-CM

## 2020-07-27 DIAGNOSIS — I10 Essential (primary) hypertension: Secondary | ICD-10-CM

## 2020-07-27 HISTORY — DX: Acute kidney failure, unspecified: N17.9

## 2020-07-27 LAB — GLUCOSE, CAPILLARY
Glucose-Capillary: 111 mg/dL — ABNORMAL HIGH (ref 70–99)
Glucose-Capillary: 173 mg/dL — ABNORMAL HIGH (ref 70–99)
Glucose-Capillary: 141 mg/dL — ABNORMAL HIGH (ref 70–99)

## 2020-07-27 LAB — COMPREHENSIVE METABOLIC PANEL
ALT: 29 U/L (ref 0–44)
AST: 51 U/L — ABNORMAL HIGH (ref 15–41)
Albumin: 2.8 g/dL — ABNORMAL LOW (ref 3.5–5.0)
Alkaline Phosphatase: 84 U/L (ref 38–126)
Anion gap: 11 (ref 5–15)
BUN: 67 mg/dL — ABNORMAL HIGH (ref 8–23)
CO2: 18 mmol/L — ABNORMAL LOW (ref 22–32)
Calcium: 8.9 mg/dL (ref 8.9–10.3)
Chloride: 109 mmol/L (ref 98–111)
Creatinine, Ser: 3.72 mg/dL — ABNORMAL HIGH (ref 0.61–1.24)
GFR, Estimated: 16 mL/min — ABNORMAL LOW (ref >60)
Glucose, Bld: 113 mg/dL — ABNORMAL HIGH (ref 70–99)
Potassium: 5.2 mmol/L — ABNORMAL HIGH (ref 3.5–5.1)
Sodium: 138 mmol/L (ref 135–145)
Total Bilirubin: 0.7 mg/dL (ref 0.3–1.2)
Total Protein: 7.2 g/dL (ref 6.5–8.1)

## 2020-07-27 LAB — CBC WITH DIFFERENTIAL/PLATELET
Abs Immature Granulocytes: 0.17 10*3/uL — ABNORMAL HIGH (ref 0.00–0.07)
Basophils Absolute: 0 10*3/uL (ref 0.0–0.1)
Basophils Relative: 0 %
Eosinophils Absolute: 0.2 10*3/uL (ref 0.0–0.5)
Eosinophils Relative: 2 %
HCT: 29.8 % — ABNORMAL LOW (ref 39.0–52.0)
Hemoglobin: 9.9 g/dL — ABNORMAL LOW (ref 13.0–17.0)
Immature Granulocytes: 2 %
Lymphocytes Relative: 15 %
Lymphs Abs: 1.4 10*3/uL (ref 0.7–4.0)
MCH: 32.6 pg (ref 26.0–34.0)
MCHC: 33.2 g/dL (ref 30.0–36.0)
MCV: 98 fL (ref 80.0–100.0)
Monocytes Absolute: 0.8 10*3/uL (ref 0.1–1.0)
Monocytes Relative: 9 %
Neutro Abs: 6.8 10*3/uL (ref 1.7–7.7)
Neutrophils Relative %: 72 %
Platelets: 237 10*3/uL (ref 150–400)
RBC: 3.04 MIL/uL — ABNORMAL LOW (ref 4.22–5.81)
RDW: 12.5 % (ref 11.5–15.5)
WBC: 9.3 10*3/uL (ref 4.0–10.5)
nRBC: 0 % (ref 0.0–0.2)

## 2020-07-27 LAB — PROCALCITONIN: Procalcitonin: 0.64 ng/mL

## 2020-07-27 LAB — LEGIONELLA PNEUMOPHILA SEROGP 1 UR AG: L. pneumophila Serogp 1 Ur Ag: NEGATIVE

## 2020-07-27 LAB — SARS CORONAVIRUS 2 (TAT 6-24 HRS): SARS Coronavirus 2: NEGATIVE

## 2020-07-27 LAB — STREP PNEUMONIAE URINARY ANTIGEN: Strep Pneumo Urinary Antigen: NEGATIVE

## 2020-07-27 LAB — VITAMIN B12: Vitamin B-12: 758 pg/mL (ref 180–914)

## 2020-07-27 MED ORDER — HYDRALAZINE HCL 25 MG PO TABS
25.0000 mg | ORAL_TABLET | Freq: Two times a day (BID) | ORAL | Status: DC
  Administered 2020-07-27 – 2020-07-30 (×5): 25 mg via ORAL
  Filled 2020-07-27 (×5): qty 1

## 2020-07-27 MED ORDER — METOPROLOL SUCCINATE ER 50 MG PO TB24
50.0000 mg | ORAL_TABLET | Freq: Every day | ORAL | Status: DC
  Administered 2020-07-28 – 2020-07-30 (×3): 50 mg via ORAL
  Filled 2020-07-27 (×3): qty 1

## 2020-07-27 MED ORDER — PANTOPRAZOLE SODIUM 40 MG PO TBEC
40.0000 mg | DELAYED_RELEASE_TABLET | Freq: Every day | ORAL | Status: DC
  Administered 2020-07-28 – 2020-07-30 (×3): 40 mg via ORAL
  Filled 2020-07-27 (×3): qty 1

## 2020-07-27 MED ORDER — EZETIMIBE 10 MG PO TABS
10.0000 mg | ORAL_TABLET | Freq: Every day | ORAL | Status: DC
  Administered 2020-07-27 – 2020-07-29 (×3): 10 mg via ORAL
  Filled 2020-07-27 (×4): qty 1

## 2020-07-27 MED ORDER — ENSURE ENLIVE PO LIQD
237.0000 mL | Freq: Two times a day (BID) | ORAL | Status: DC
  Administered 2020-07-28 – 2020-07-29 (×3): 237 mL via ORAL

## 2020-07-27 MED ORDER — TAMSULOSIN HCL 0.4 MG PO CAPS
0.4000 mg | ORAL_CAPSULE | Freq: Every day | ORAL | Status: DC
  Administered 2020-07-28 – 2020-07-30 (×3): 0.4 mg via ORAL
  Filled 2020-07-27 (×3): qty 1

## 2020-07-27 MED ORDER — AZITHROMYCIN 250 MG PO TABS
500.0000 mg | ORAL_TABLET | Freq: Every day | ORAL | Status: AC
  Administered 2020-07-28 – 2020-07-30 (×3): 500 mg via ORAL
  Filled 2020-07-27 (×3): qty 2

## 2020-07-27 MED ORDER — ADULT MULTIVITAMIN W/MINERALS CH
1.0000 | ORAL_TABLET | Freq: Every day | ORAL | Status: DC
  Administered 2020-07-28 – 2020-07-30 (×3): 1 via ORAL
  Filled 2020-07-27 (×3): qty 1

## 2020-07-27 NOTE — Progress Notes (Addendum)
PROGRESS NOTE    [name] [name]  JOI:[id number] DOB: [birth year] DOA: 07/26/2020 PCP: Perrin Maltese, MD   Brief Narrative: [name] [name] is a 75 y.o. male with a history of CAD, COPD, chronic respiratory failure, diabetes mellitus, hyperlipidemia, hypertension, gout, OSA, BPH, GERD. Patient presented from his PCP's office secondary to failure of outpatient treatment of pneumonia. Ceftriaxone and azithromycin initiated. Also with AKI noted.   Assessment & Plan:   Principal Problem:   CAP (community acquired pneumonia) Active Problems:   AKI (acute kidney injury) ([place])   Chronic respiratory failure with hypoxia ([place])   CKD (chronic kidney disease), stage IV ([place])   Essential hypertension   Community acquired pneumonia X-ray without evidence of disease. Patient with symptoms of cough and sputum production. Oxygen is baseline. Sputum culture gram stain significant for GPC. RVP negative. MRSA PCR negative. -Continue Ceftriaxone/Azithromycin IV -Follow sputum culture -Follow-up blood cultures  Chronic respiratory failure with hypoxia Stable -Continue oxygen at 3 lpm  AKI on CKD stage IV  Baseline creatinine of 2.3. Creatinine of  4.43 on admission. IV fluids given with some improvement. Hold Cozaar -BMP in AM  Chronic systolic heart failure In setting of previous valvular dysfunction s/p AVR. Chest x-ray with pulmonary infiltrates possibly secondary to edema. Asymptomatic. Overall appears euvolemic. -Hold lasix and losartan secondary to AKI -Continue Toprol XL -Transthoracic Echocardiogram   Primary hypertension Patient is on hydralazine, losartan and metoprolol as an outpatient. -Hold losartan secondary to AKI -Restart home Toprol XL and hydralazine  Anemia In setting of CKD. downtrended slightly likely from IV fluids. Baseline hemoglobin, though appears to be around 11-12. No obvious source of bleeding. -CBC in AM  Diabetes mellitus, type  2 Patient is on diet control as an outpatient. Hemoglobin A1C is 5.8%. -Discontinue SSI  Gout Patient is on allopurinol as an outpatient. Held in setting of kidney function.  CAD Hyperlipidemia Patient is on aspirin 81 mg, Zetia -Resume aspirin and Zetia  OSA -Continue CPAP  BPH -Continue Flomax   DVT prophylaxis: Heparin Code Status:   Code Status: Full Code Family Communication: Wife and son at bedside. Daughter on telephone Disposition Plan: Discharge home likely in 1-2 days pending improvement of AKI and culture data   Consultants:   None  Procedures:   None  Antimicrobials:  Ceftriaxone  Azithromycin    Subjective: No issues overnight. No chest pain or dyspnea. Some productive cough.  Objective: Vitals:   07/26/20 2319 07/27/20 0355 07/27/20 0807 07/27/20 1146  BP: (!) 130/57 118/67 (!) 144/67 (!) 147/70  Pulse: 98 95 89 89  Resp: (!) 22 20 20 19   Temp: 98.4 F (36.9 C) 98.8 F (37.1 C) 98 F (36.7 C) 97.7 F (36.5 C)  TempSrc: Oral Oral Oral Oral  SpO2: 98% 96% 95% 93%  Weight:      Height:        Intake/Output Summary (Last 24 hours) at 07/27/2020 1624 Last data filed at 07/27/2020 1619 Gross per 24 hour  Intake 350 ml  Output 1475 ml  Net -1125 ml   Filed Weights   07/26/20 1400  Weight: 91.8 kg    Examination:  General exam: Appears calm and comfortable Respiratory system: Clear to auscultation. Respiratory effort normal. Cardiovascular system: S1 & S2 heard, RRR. No murmurs, rubs, gallops or clicks. Gastrointestinal system: Abdomen is distended, soft and nontender. No organomegaly or masses felt. Normal bowel sounds heard. Central nervous system: Alert and oriented. No focal neurological deficits. Musculoskeletal: No edema.  No calf tenderness Skin: No cyanosis. No rashes Psychiatry: Judgement and insight appear normal. Mood & affect appropriate.     Data Reviewed: I have personally reviewed following labs and imaging  studies  CBC Lab Results  Component Value Date   WBC 9.3 07/27/2020   RBC 3.04 (L) 07/27/2020   HGB 9.9 (L) 07/27/2020   HCT 29.8 (L) 07/27/2020   MCV 98.0 07/27/2020   MCH 32.6 07/27/2020   PLT 237 07/27/2020   MCHC 33.2 07/27/2020   RDW 12.5 07/27/2020   LYMPHSABS 1.4 07/27/2020   MONOABS 0.8 07/27/2020   EOSABS 0.2 07/27/2020   BASOSABS 0.0 39/76/7341     Last metabolic panel Lab Results  Component Value Date   NA 138 07/27/2020   K 5.2 (H) 07/27/2020   CL 109 07/27/2020   CO2 18 (L) 07/27/2020   BUN 67 (H) 07/27/2020   CREATININE 3.72 (H) 07/27/2020   GLUCOSE 113 (H) 07/27/2020   GFRNONAA 16 (L) 07/27/2020   GFRAA 23 (L) 07/24/2016   CALCIUM 8.9 07/27/2020   PHOS 4.3 07/10/2016   PROT 7.2 07/27/2020   ALBUMIN 2.8 (L) 07/27/2020   BILITOT 0.7 07/27/2020   ALKPHOS 84 07/27/2020   AST 51 (H) 07/27/2020   ALT 29 07/27/2020   ANIONGAP 11 07/27/2020    CBG (last 3)  Recent Labs    07/27/20 0803 07/27/20 1142 07/27/20 1617  GLUCAP 111* 173* 141*     GFR: Estimated Creatinine Clearance: 17.6 mL/min (A) (by C-G formula based on SCr of 3.72 mg/dL (H)).  Coagulation Profile: No results for input(s): INR, PROTIME in the last 168 hours.  Recent Results (from the past 240 hour(s))  Culture, sputum-assessment     Status: None   Collection Time: 07/26/20  1:32 PM   Specimen: Sputum  Result Value Ref Range Status   Specimen Description SPUTUM  Final   Special Requests NONE  Final   Sputum evaluation   Final    THIS SPECIMEN IS ACCEPTABLE FOR SPUTUM CULTURE Performed at [facility], [address], [place], [place] [address]    Report Status 07/26/2020 FINAL  Final  Culture, Respiratory w Gram Stain     Status: None (Preliminary result)   Collection Time: 07/26/20  1:32 PM   Specimen: SPU  Result Value Ref Range Status   Specimen Description   Final    SPUTUM Performed at [facility], [address] [place], [place] [address]     Special Requests   Final    NONE Reflexed from [id number] Performed at [facility], [address] [place], [place] [address]    Gram Stain   Final    RARE WBC PRESENT, PREDOMINANTLY PMN FEW GRAM POSITIVE COCCI IN PAIRS IN CLUSTERS Performed at [facility], [place] [address], [place], [place] [address]    Culture PENDING  Incomplete   Report Status PENDING  Incomplete  Respiratory (~20 pathogens) panel by PCR     Status: None   Collection Time: 07/26/20  1:33 PM   Specimen: Nasopharyngeal Swab; Respiratory  Result Value Ref Range Status   Adenovirus NOT DETECTED NOT DETECTED Final   Coronavirus 229E NOT DETECTED NOT DETECTED Final    Comment: (NOTE) The Coronavirus on the Respiratory Panel, DOES NOT test for the novel  Coronavirus (2019 nCoV)    Coronavirus HKU1 NOT DETECTED NOT DETECTED Final   Coronavirus NL63 NOT DETECTED NOT DETECTED Final   Coronavirus OC43 NOT DETECTED NOT DETECTED Final  Metapneumovirus NOT DETECTED NOT DETECTED Final   Rhinovirus / Enterovirus NOT DETECTED NOT DETECTED Final   Influenza A NOT DETECTED NOT DETECTED Final   Influenza B NOT DETECTED NOT DETECTED Final   Parainfluenza Virus 1 NOT DETECTED NOT DETECTED Final   Parainfluenza Virus 2 NOT DETECTED NOT DETECTED Final   Parainfluenza Virus 3 NOT DETECTED NOT DETECTED Final   Parainfluenza Virus 4 NOT DETECTED NOT DETECTED Final   Respiratory Syncytial Virus NOT DETECTED NOT DETECTED Final   Bordetella pertussis NOT DETECTED NOT DETECTED Final   Bordetella Parapertussis NOT DETECTED NOT DETECTED Final   Chlamydophila pneumoniae NOT DETECTED NOT DETECTED Final   Mycoplasma pneumoniae NOT DETECTED NOT DETECTED Final    Comment: Performed at [facility], [place] [address] [place], [place] [address]  MRSA PCR Screening     Status: None   Collection Time: 07/26/20  1:33 PM   Specimen: Nasopharyngeal  Result Value Ref Range Status   MRSA by PCR NEGATIVE NEGATIVE Final    Comment:         The GeneXpert MRSA Assay (FDA approved for NASAL specimens only), is one component of a comprehensive MRSA colonization surveillance program. It is not intended to diagnose MRSA infection nor to guide or monitor treatment for MRSA infections. Performed at [facility], [address] [place], [place] [address]   Culture, blood (routine x 2) Call MD if unable to obtain prior to antibiotics being given     Status: None (Preliminary result)   Collection Time: 07/26/20  1:44 PM   Specimen: BLOOD  Result Value Ref Range Status   Specimen Description BLOOD LEFT ANTECUBITAL  Final   Special Requests   Final    BOTTLES DRAWN AEROBIC AND ANAEROBIC Blood Culture adequate volume   Culture   Final    NO GROWTH < 24 HOURS Performed at [facility], [address] [place], [place] [address]    Report Status PENDING  Incomplete  Culture, blood (routine x 2) Call MD if unable to obtain prior to antibiotics being given     Status: None (Preliminary result)   Collection Time: 07/26/20  1:45 PM   Specimen: BLOOD  Result Value Ref Range Status   Specimen Description BLOOD BLOOD LEFT HAND  Final   Special Requests   Final    BOTTLES DRAWN AEROBIC AND ANAEROBIC Blood Culture adequate volume   Culture   Final    NO GROWTH < 24 HOURS Performed at [facility], [address] [place], [place] [address]    Report Status PENDING  Incomplete  SARS CORONAVIRUS 2 (TAT 6-24 HRS) Nasopharyngeal Nasopharyngeal Swab     Status: None   Collection Time: 07/26/20 10:45 PM   Specimen: Nasopharyngeal Swab  Result Value Ref Range Status   SARS Coronavirus 2 NEGATIVE NEGATIVE Final    Comment: (NOTE) SARS-CoV-2 target nucleic acids are NOT DETECTED.  The SARS-CoV-2 RNA is generally detectable in upper and lower respiratory specimens during the acute phase of infection. Negative results do not preclude SARS-CoV-2 infection, do not rule out co-infections with other pathogens,  and should not be used as the sole basis for treatment or other patient management decisions. Negative results must be combined with clinical observations, patient history, and epidemiological information. The expected result is Negative.  Fact Sheet for Patients: SugarRoll.be  Fact Sheet for Healthcare Providers: https://www.woods-mathews.com/  This test is not yet approved or cleared by the [place] FDA and  has been  authorized for detection and/or diagnosis of SARS-CoV-2 by FDA under an Emergency Use Authorization (EUA). This EUA will remain  in effect (meaning this test can be used) for the duration of the COVID-19 declaration under Se ction 564(b)(1) of the Act, 21 U.S.C. section 360bbb-3(b)(1), unless the authorization is terminated or revoked sooner.  Performed at [facility], [place] [address] [place], [place] [address]         Radiology Studies: DG Chest 1 View  Result Date: 07/26/2020 CLINICAL DATA:  Shortness of breath, history of CHF EXAM: CHEST  1 VIEW COMPARISON:  07/24/2016 FINDINGS: Cardiomegaly. Mild, diffuse interstitial pulmonary opacity. The visualized skeletal structures are unremarkable. IMPRESSION: Cardiomegaly with mild, diffuse interstitial pulmonary opacity, likely edema. No focal airspace opacity. Electronically Signed   By: Eddie Candle M.D.   On: 07/26/2020 14:00        Scheduled Meds: . [START ON 07/28/2020] azithromycin  500 mg Oral Daily  . [START ON 07/28/2020] feeding supplement  237 mL Oral BID BM  . heparin  5,000 Units Subcutaneous Q8H  . insulin aspart  0-6 Units Subcutaneous TID WC  . insulin aspart  3 Units Subcutaneous TID WC  . [START ON 07/28/2020] multivitamin with minerals  1 tablet Oral Daily   Continuous Infusions: . cefTRIAXone (ROCEPHIN)  IV 2 g (07/27/20 0912)     LOS: 1 day     Cordelia Poche, MD [facility] 07/27/2020, 4:24 PM  If 7PM-7AM, please contact  night-coverage [email or web address]

## 2020-07-27 NOTE — Progress Notes (Signed)
Initial Nutrition Assessment  DOCUMENTATION CODES:   Obesity unspecified  INTERVENTION:   Ensure Enlive po BID, each supplement provides 350 kcal and 20 grams of protein  MVI po daily   Pt at high refeed risk; recommend monitor potassium, magnesium and phosphorus labs daily until stable  NUTRITION DIAGNOSIS:   Inadequate oral intake related to acute illness as evidenced by per patient/family report.  GOAL:   Patient will meet greater than or equal to 90% of their needs  MONITOR:   PO intake,Supplement acceptance,Labs,Weight trends,Skin,I & O's  REASON FOR ASSESSMENT:   Malnutrition Screening Tool    ASSESSMENT:   75 y.o. 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 Pt reports decreased appetite and oral intake pta. Pt reports that "I just have no appetite." Pt reports eating fairly well at breakfast today but reports that he did not eat much of his lunch. Per chart, pt is down 15lbs(7%) since October 2021. Pt reports weight loss was intentional as he has been dieting at home. RD discussed with pt the importance of adequate protein needed to preserve lean muscle. Pt reports that he is wiling to drink strawberry supplements in hospital. RD will add supplements and MVI to help pt meet his estimated needs. Pt is likely at refeed risk.   Medications reviewed and include: azithromycin, heparin, insulin, ceftriaxone   Labs reviewed: K 5.2(H), BUN 67(H), creat 3.72(H) Hgb 9.9(L), Hct 29.8(L) cbgs- 111, 173 x 24 hrs AIC 5.8- 4/4  NUTRITION - FOCUSED PHYSICAL EXAM:  Flowsheet Row Most Recent Value  Orbital Region No depletion  Upper Arm Region Mild depletion  Thoracic and Lumbar Region No depletion  Buccal Region No depletion  Temple Region No depletion  Clavicle Bone Region No depletion  Clavicle and Acromion Bone Region No depletion   Scapular Bone Region No depletion  Dorsal Hand Moderate depletion  Patellar Region Moderate depletion  Anterior Thigh Region Mild depletion  Posterior Calf Region Moderate depletion  Edema (RD Assessment) None  Hair Reviewed  Eyes Reviewed  Mouth Reviewed  Skin Reviewed  Nails Reviewed     Diet Order:   Diet Order            Diet Carb Modified Fluid consistency: Thin; Room service appropriate? Yes  Diet effective now                EDUCATION NEEDS:   Education needs have been addressed  Skin:  Skin Assessment: Reviewed RN Assessment  Last BM:  PTA  Height:   Ht Readings from Last 1 Encounters:  07/26/20 5' 5"  (1.651 m)    Weight:   Wt Readings from Last 1 Encounters:  07/26/20 91.8 kg    Ideal Body Weight:  61.8 kg  BMI:  Body mass index is 33.68 kg/m.  Estimated Nutritional Needs:   Kcal:  1900-2200kcal/day  Protein:  90-100g/day  Fluid:  1.6-1.9L/day  Koleen Distance MS, RD, LDN Please refer to [facility] for RD and/or RD on-call/weekend/after hours pager

## 2020-07-28 ENCOUNTER — Inpatient Hospital Stay
Admission: AD | Admit: 2020-07-28 | Discharge: 2020-07-28 | Disposition: A | Discharge status: Home / Self Care | Payer: Medicare HMO | Source: Ambulatory Visit | Attending: Family Medicine | Admitting: Family Medicine

## 2020-07-28 DIAGNOSIS — J189 Pneumonia, unspecified organism: Secondary | ICD-10-CM

## 2020-07-28 LAB — BASIC METABOLIC PANEL
Anion gap: 10 (ref 5–15)
BUN: 67 mg/dL — ABNORMAL HIGH (ref 8–23)
CO2: 20 mmol/L — ABNORMAL LOW (ref 22–32)
Calcium: 8.9 mg/dL (ref 8.9–10.3)
Chloride: 109 mmol/L (ref 98–111)
Creatinine, Ser: 3.36 mg/dL — ABNORMAL HIGH (ref 0.61–1.24)
GFR, Estimated: 18 mL/min — ABNORMAL LOW (ref >60)
Glucose, Bld: 130 mg/dL — ABNORMAL HIGH (ref 70–99)
Potassium: 5.2 mmol/L — ABNORMAL HIGH (ref 3.5–5.1)
Sodium: 139 mmol/L (ref 135–145)

## 2020-07-28 LAB — GLUCOSE, CAPILLARY
Glucose-Capillary: 187 mg/dL — ABNORMAL HIGH (ref 70–99)
Glucose-Capillary: 185 mg/dL — ABNORMAL HIGH (ref 70–99)
Glucose-Capillary: 154 mg/dL — ABNORMAL HIGH (ref 70–99)
Glucose-Capillary: 188 mg/dL — ABNORMAL HIGH (ref 70–99)
Glucose-Capillary: 156 mg/dL — ABNORMAL HIGH (ref 70–99)

## 2020-07-28 LAB — PROCALCITONIN: Procalcitonin: 0.38 ng/mL

## 2020-07-28 MED ORDER — INSULIN ASPART 100 UNIT/ML ~~LOC~~ SOLN
0.0000 [IU] | Freq: Three times a day (TID) | SUBCUTANEOUS | Status: DC
  Administered 2020-07-28 – 2020-07-29 (×4): 2 [IU] via SUBCUTANEOUS
  Administered 2020-07-30 (×2): 1 [IU] via SUBCUTANEOUS
  Filled 2020-07-28 (×6): qty 1

## 2020-07-28 MED ORDER — ACETAMINOPHEN 325 MG PO TABS
650.0000 mg | ORAL_TABLET | Freq: Four times a day (QID) | ORAL | Status: DC | PRN
  Administered 2020-07-28: 650 mg via ORAL
  Filled 2020-07-28: qty 2

## 2020-07-28 NOTE — Plan of Care (Signed)

## 2020-07-28 NOTE — Progress Notes (Signed)
PROGRESS NOTE    [name] [name]  OVF:[id number] DOB: 1945-07-24 DOA: 07/26/2020 PCP: Perrin Maltese, MD     Brief Narrative:  [name] [name] is a 75 y.o. 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 On arrival to floor patient noted to have stable vitals with oxygen saturation of 97% on 3 L  chronic O2. Patient notes that he has been ill for the last two weeks with increased SOB, cough with tan brown sputum. He notes no associated chest pain, n/v/diarrhea but noted abdominal distention and increase indigestion. He states that he was placed on trial of antibiotics but due to persistent symptoms and increasing WBC, he was referred for admission.  He noted fever at initial onset as well as chills.   New events last 24 hours / Subjective: Patient sitting in a recliner, states that his breathing has improved.  Denies any peripheral edema.  Has had some intermittent coughing.  Assessment & Plan:   Principal Problem:   CAP (community acquired pneumonia) Active Problems:   AKI (acute kidney injury) ([place])   Chronic respiratory failure with hypoxia ([place])   CKD (chronic kidney disease), stage IV ([place])   Essential hypertension   CAP -Failure of outpatient antibiotic treatment -Procalcitonin trending downward -Continue Rocephin, azithromycin  Chronic hypoxemic respiratory failure -Uses 3 L oxygen at baseline -Continue  AKI on CKD stage IV -Baseline creatinine around 2.3 -Improving, continue to trend  Hypertension -Hold losartan, Lasix due to AKI -Continue metoprolol  Diabetes mellitus type 2 -Hemoglobin A1c 5.8  OSA -CPAP nightly  Chronic combined systolic and diastolic CHF -Lasix on hold due to AKI -Without exacerbation, without peripheral edema, BNP 85.4 -Echocardiogram pending  BPH -Continue  tamsulosin  GERD -Continue PPI  DVT prophylaxis:  heparin injection 5,000 Units Start: 07/26/20 1430  Code Status: Full code Family Communication: No family at bedside Disposition Plan:  Status is: Inpatient  Remains inpatient appropriate because:IV treatments appropriate due to intensity of illness or inability to take PO   Dispo: The patient is from: Home              Anticipated d/c is to: Home              Patient currently is not medically stable to d/c.  Remains on IV antibiotics.  Hopeful discharge home 4/7   Difficult to place patient No    Antimicrobials:  Anti-infectives (From admission, onward)   Start     Dose/Rate Route Frequency Ordered Stop   07/28/20 1000  azithromycin (ZITHROMAX) tablet 500 mg        500 mg Oral Daily 07/27/20 1214 07/31/20 0959   07/26/20 1345  cefTRIAXone (ROCEPHIN) 2 g in sodium chloride 0.9 % 100 mL IVPB        2 g 200 mL/hr over 30 Minutes Intravenous Daily 07/26/20 1333 07/31/20 0959   07/26/20 1345  azithromycin (ZITHROMAX) 500 mg in sodium chloride 0.9 % 250 mL IVPB  Status:  Discontinued        500 mg 250 mL/hr over 60 Minutes Intravenous Daily 07/26/20 1333 07/27/20 1214        Objective: Vitals:   07/27/20 1942 07/28/20 0445 07/28/20 0800 07/28/20 1200  BP: (!) 126/59 (!) 164/67 (!) 126/100 132/63  Pulse: 80 77 78 76  Resp: 18 20 18 18   Temp: 98.3  F (36.8 C) (!) 97.5 F (36.4 C) (!) 97.5 F (36.4 C) 98.4 F (36.9 C)  TempSrc: Oral Oral Oral Oral  SpO2: 100% 98% 98%   Weight:      Height:        Intake/Output Summary (Last 24 hours) at 07/28/2020 1423 Last data filed at 07/28/2020 1358 Gross per 24 hour  Intake 948.06 ml  Output 775 ml  Net 173.06 ml   Filed Weights   07/26/20 1400  Weight: 91.8 kg    Examination:  General exam: Appears calm and comfortable  Respiratory system: Clear to auscultation on left, diminished on right. Cardiovascular system: S1 & S2 heard, RRR. No murmurs. No pedal  edema. Gastrointestinal system: Abdomen is nondistended, soft and nontender. Normal bowel sounds heard. Central nervous system: Alert and oriented. No focal neurological deficits. Speech clear.  Extremities: Symmetric in appearance  Skin: No rashes, lesions or ulcers on exposed skin  Psychiatry: Judgement and insight appear normal. Mood & affect appropriate.   Data Reviewed: I have personally reviewed following labs and imaging studies  CBC: Recent Labs  Lab 07/26/20 1344 07/27/20 0436  WBC 8.1 9.3  NEUTROABS 5.8 6.8  HGB 10.4* 9.9*  HCT 30.4* 29.8*  MCV 96.5 98.0  PLT 214 902   Basic Metabolic Panel: Recent Labs  Lab 07/26/20 1344 07/27/20 0436 07/28/20 0434  NA 138 138 139  K 4.8 5.2* 5.2*  CL 107 109 109  CO2 21* 18* 20*  GLUCOSE 128* 113* 130*  BUN 70* 67* 67*  CREATININE 4.43* 3.72* 3.36*  CALCIUM 9.2 8.9 8.9   GFR: Estimated Creatinine Clearance: 19.5 mL/min (A) (by C-G formula based on SCr of 3.36 mg/dL (H)). Liver Function Tests: Recent Labs  Lab 07/27/20 0436  AST 51*  ALT 29  ALKPHOS 84  BILITOT 0.7  PROT 7.2  ALBUMIN 2.8*   No results for input(s): LIPASE, AMYLASE in the last 168 hours. No results for input(s): AMMONIA in the last 168 hours. Coagulation Profile: No results for input(s): INR, PROTIME in the last 168 hours. Cardiac Enzymes: No results for input(s): CKTOTAL, CKMB, CKMBINDEX, TROPONINI in the last 168 hours. BNP (last 3 results) No results for input(s): PROBNP in the last 8760 hours. HbA1C: Recent Labs    07/26/20 1344  HGBA1C 5.8*   CBG: Recent Labs  Lab 07/27/20 1142 07/27/20 1617 07/28/20 0614 07/28/20 0833 07/28/20 1243  GLUCAP 173* 141* 187* 185* 154*   Lipid Profile: No results for input(s): CHOL, HDL, LDLCALC, TRIG, CHOLHDL, LDLDIRECT in the last 72 hours. Thyroid Function Tests: Recent Labs    07/26/20 1344  TSH 3.052   Anemia Panel: Recent Labs    07/26/20 2139  VITAMINB12 758  FOLATE 20.1   FERRITIN 589*  TIBC 165*  IRON 17*  RETICCTPCT 1.1   Sepsis Labs: Recent Labs  Lab 07/26/20 1344 07/26/20 1636 07/27/20 0436 07/28/20 0434  PROCALCITON 1.03  --  0.64 0.38  LATICACIDVEN 1.0 0.8  --   --     Recent Results (from the past 240 hour(s))  Culture, sputum-assessment     Status: None   Collection Time: 07/26/20  1:32 PM   Specimen: Sputum  Result Value Ref Range Status   Specimen Description SPUTUM  Final   Special Requests NONE  Final   Sputum evaluation   Final    THIS SPECIMEN IS ACCEPTABLE FOR SPUTUM CULTURE Performed at [facility], [address], [place], [place] [address]    Report Status  07/26/2020 FINAL  Final  Culture, Respiratory w Gram Stain     Status: None (Preliminary result)   Collection Time: 07/26/20  1:32 PM   Specimen: SPU  Result Value Ref Range Status   Specimen Description   Final    SPUTUM Performed at [facility], [address] [place], [place] [address]    Special Requests   Final    NONE Reflexed from [id number] Performed at [facility], [address] [place], [place] [address]    Gram Stain   Final    RARE WBC PRESENT, PREDOMINANTLY PMN FEW GRAM POSITIVE COCCI IN PAIRS IN CLUSTERS    Culture   Final    CULTURE REINCUBATED FOR BETTER GROWTH Performed at [facility], [place] [address], [place], [place] [address]    Report Status PENDING  Incomplete  Respiratory (~20 pathogens) panel by PCR     Status: None   Collection Time: 07/26/20  1:33 PM   Specimen: Nasopharyngeal Swab; Respiratory  Result Value Ref Range Status   Adenovirus NOT DETECTED NOT DETECTED Final   Coronavirus 229E NOT DETECTED NOT DETECTED Final    Comment: (NOTE) The Coronavirus on the Respiratory Panel, DOES NOT test for the novel  Coronavirus (2019 nCoV)    Coronavirus HKU1 NOT DETECTED NOT DETECTED Final   Coronavirus NL63 NOT DETECTED NOT DETECTED Final   Coronavirus OC43 NOT DETECTED NOT DETECTED Final    Metapneumovirus NOT DETECTED NOT DETECTED Final   Rhinovirus / Enterovirus NOT DETECTED NOT DETECTED Final   Influenza A NOT DETECTED NOT DETECTED Final   Influenza B NOT DETECTED NOT DETECTED Final   Parainfluenza Virus 1 NOT DETECTED NOT DETECTED Final   Parainfluenza Virus 2 NOT DETECTED NOT DETECTED Final   Parainfluenza Virus 3 NOT DETECTED NOT DETECTED Final   Parainfluenza Virus 4 NOT DETECTED NOT DETECTED Final   Respiratory Syncytial Virus NOT DETECTED NOT DETECTED Final   Bordetella pertussis NOT DETECTED NOT DETECTED Final   Bordetella Parapertussis NOT DETECTED NOT DETECTED Final   Chlamydophila pneumoniae NOT DETECTED NOT DETECTED Final   Mycoplasma pneumoniae NOT DETECTED NOT DETECTED Final    Comment: Performed at [facility], [place] [address], [place], [place] [address]  MRSA PCR Screening     Status: None   Collection Time: 07/26/20  1:33 PM   Specimen: Nasopharyngeal  Result Value Ref Range Status   MRSA by PCR NEGATIVE NEGATIVE Final    Comment:        The GeneXpert MRSA Assay (FDA approved for NASAL specimens only), is one component of a comprehensive MRSA colonization surveillance program. It is not intended to diagnose MRSA infection nor to guide or monitor treatment for MRSA infections. Performed at [facility], [address] [place], [place] [address]   Culture, blood (routine x 2) Call MD if unable to obtain prior to antibiotics being given     Status: None (Preliminary result)   Collection Time: 07/26/20  1:44 PM   Specimen: BLOOD  Result Value Ref Range Status   Specimen Description BLOOD LEFT ANTECUBITAL  Final   Special Requests   Final    BOTTLES DRAWN AEROBIC AND ANAEROBIC Blood Culture adequate volume   Culture   Final    NO GROWTH 2 DAYS Performed at [facility], [address], [place],  [address]    Report Status PENDING  Incomplete  Culture, blood (routine x 2) Call MD if unable to obtain prior to  antibiotics being given     Status: None (Preliminary result)   Collection Time: 07/26/20  1:45 PM   Specimen: BLOOD  Result Value Ref Range Status   Specimen Description BLOOD BLOOD LEFT HAND  Final   Special Requests   Final    BOTTLES DRAWN AEROBIC AND ANAEROBIC Blood Culture adequate volume   Culture   Final    NO GROWTH 2 DAYS Performed at [facility], [address] [place], [place] [address]    Report Status PENDING  Incomplete  SARS CORONAVIRUS 2 (TAT 6-24 HRS) Nasopharyngeal Nasopharyngeal Swab     Status: None   Collection Time: 07/26/20 10:45 PM   Specimen: Nasopharyngeal Swab  Result Value Ref Range Status   SARS Coronavirus 2 NEGATIVE NEGATIVE Final    Comment: (NOTE) SARS-CoV-2 target nucleic acids are NOT DETECTED.  The SARS-CoV-2 RNA is generally detectable in upper and lower respiratory specimens during the acute phase of infection. Negative results do not preclude SARS-CoV-2 infection, do not rule out co-infections with other pathogens, and should not be used as the sole basis for treatment or other patient management decisions. Negative results must be combined with clinical observations, patient history, and epidemiological information. The expected result is Negative.  Fact Sheet for Patients: SugarRoll.be  Fact Sheet for Healthcare Providers: https://www.woods-mathews.com/  This test is not yet approved or cleared by the [place] FDA and  has been authorized for detection and/or diagnosis of SARS-CoV-2 by FDA under an Emergency Use Authorization (EUA). This EUA will remain  in effect (meaning this test can be used) for the duration of the COVID-19 declaration under Se ction 564(b)(1) of the Act, 21 U.S.C. section 360bbb-3(b)(1), unless the authorization is terminated or revoked sooner.  Performed at [facility], [place] [address] [place], [place] [address]       Radiology Studies: No  results found.    Scheduled Meds: . azithromycin  500 mg Oral Daily  . ezetimibe  10 mg Oral QHS  . feeding supplement  237 mL Oral BID BM  . heparin  5,000 Units Subcutaneous Q8H  . hydrALAZINE  25 mg Oral BID  . insulin aspart  3 Units Subcutaneous TID WC  . metoprolol succinate  50 mg Oral Daily  . multivitamin with minerals  1 tablet Oral Daily  . pantoprazole  40 mg Oral Daily  . tamsulosin  0.4 mg Oral Daily   Continuous Infusions: . cefTRIAXone (ROCEPHIN)  IV 2 g (07/28/20 0855)     LOS: 2 days      Time spent: 25 minutes   Dessa Phi, DO [facility] 07/28/2020, 2:23 PM   Available via Epic secure chat 7am-7pm After these hours, please refer to coverage provider listed on [email or web address]

## 2020-07-28 NOTE — Progress Notes (Signed)
At 1729 patient had ran to [facility], it was for 7 beats and patient is asymptomatic. Dr. Maylene Roes is informed.

## 2020-07-28 NOTE — Progress Notes (Signed)
*  PRELIMINARY RESULTS* Echocardiogram 2D Echocardiogram has been performed.  [name] [name] 07/28/2020, 1:31 PM

## 2020-07-29 LAB — BASIC METABOLIC PANEL
Anion gap: 9 (ref 5–15)
Anion gap: 11 (ref 5–15)
BUN: 68 mg/dL — ABNORMAL HIGH (ref 8–23)
BUN: 64 mg/dL — ABNORMAL HIGH (ref 8–23)
CO2: 22 mmol/L (ref 22–32)
CO2: 20 mmol/L — ABNORMAL LOW (ref 22–32)
Calcium: 9.2 mg/dL (ref 8.9–10.3)
Calcium: 9.1 mg/dL (ref 8.9–10.3)
Chloride: 109 mmol/L (ref 98–111)
Chloride: 109 mmol/L (ref 98–111)
Creatinine, Ser: 3.02 mg/dL — ABNORMAL HIGH (ref 0.61–1.24)
Creatinine, Ser: 2.71 mg/dL — ABNORMAL HIGH (ref 0.61–1.24)
GFR, Estimated: 21 mL/min — ABNORMAL LOW (ref >60)
GFR, Estimated: 24 mL/min — ABNORMAL LOW (ref >60)
Glucose, Bld: 140 mg/dL — ABNORMAL HIGH (ref 70–99)
Glucose, Bld: 167 mg/dL — ABNORMAL HIGH (ref 70–99)
Potassium: 5.7 mmol/L — ABNORMAL HIGH (ref 3.5–5.1)
Potassium: 5.3 mmol/L — ABNORMAL HIGH (ref 3.5–5.1)
Sodium: 140 mmol/L (ref 135–145)
Sodium: 140 mmol/L (ref 135–145)

## 2020-07-29 LAB — ECHOCARDIOGRAM COMPLETE
AR max vel: 2.95 cm2
AV Area VTI: 2.81 cm2
AV Area mean vel: 2.64 cm2
AV Mean grad: 7 mmHg
AV Peak grad: 13.1 mmHg
Ao pk vel: 1.81 m/s
Area-P 1/2: 5.42 cm2
Height: 65 in
MV VTI: 2.36 cm2
S' Lateral: 3.8 cm
Weight: 3238.12 oz

## 2020-07-29 LAB — CULTURE, RESPIRATORY W GRAM STAIN: Culture: NORMAL

## 2020-07-29 LAB — GLUCOSE, CAPILLARY
Glucose-Capillary: 159 mg/dL — ABNORMAL HIGH (ref 70–99)
Glucose-Capillary: 177 mg/dL — ABNORMAL HIGH (ref 70–99)
Glucose-Capillary: 166 mg/dL — ABNORMAL HIGH (ref 70–99)
Glucose-Capillary: 129 mg/dL — ABNORMAL HIGH (ref 70–99)

## 2020-07-29 MED ORDER — SODIUM ZIRCONIUM CYCLOSILICATE 10 G PO PACK
10.0000 g | PACK | Freq: Once | ORAL | Status: AC
  Administered 2020-07-29: 10 g via ORAL
  Filled 2020-07-29 (×2): qty 1

## 2020-07-29 NOTE — Plan of Care (Signed)

## 2020-07-29 NOTE — Progress Notes (Signed)
PROGRESS NOTE    [name] [name]  LFY:[id number] DOB: 15-Oct-1945 DOA: 07/26/2020 PCP: Perrin Maltese, MD     Brief Narrative:  [name] [name] is a 75 y.o. 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 On arrival to floor patient noted to have stable vitals with oxygen saturation of 97% on 3 L [place] chronic O2. Patient notes that he has been ill for the last two weeks with increased SOB, cough with tan brown sputum. He notes no associated chest pain, n/v/diarrhea but noted abdominal distention and increase indigestion. He states that he was placed on trial of antibiotics but due to persistent symptoms and increasing WBC, he was referred for admission.  He noted fever at initial onset as well as chills.   New events last 24 hours / Subjective: Patient sitting in recliner, has no physical complaints today. Denies CP, SOB. Remains on 3L O2.   Assessment & Plan:   Principal Problem:   CAP (community acquired pneumonia) Active Problems:   AKI (acute kidney injury) ([place])   Chronic respiratory failure with hypoxia ([place])   CKD (chronic kidney disease), stage IV ([place])   Essential hypertension   CAP -Failure of outpatient antibiotic treatment -Procalcitonin trending downward -Continue Rocephin, azithromycin  Chronic hypoxemic respiratory failure -Uses 3 L oxygen at baseline -Continue  AKI on CKD stage IV -Baseline creatinine around 2.3 -Improving, continue to trend  Hyperkalemia -Lokelma ordered, trend BMP   Hypertension -Hold losartan, Lasix due to AKI -Continue metoprolol  Diabetes mellitus type 2 -Hemoglobin A1c 5.8  OSA -CPAP nightly  Chronic combined systolic and diastolic CHF -Lasix on hold due to AKI -Without exacerbation, without peripheral edema, BNP 85.4 -Echocardiogram pending  BPH -Continue  tamsulosin  GERD -Continue PPI  DVT prophylaxis:  heparin injection 5,000 Units Start: 07/26/20 1430  Code Status: Full code Family Communication: No family at bedside Disposition Plan:  Status is: Inpatient  Remains inpatient appropriate because:IV treatments appropriate due to intensity of illness or inability to take PO   Dispo: The patient is from: Home              Anticipated d/c is to: Home              Patient currently is not medically stable to d/c.  Treat hyperkalemia. Hopeful dc 4/8    Difficult to place patient No    Antimicrobials:  Anti-infectives (From admission, onward)   Start     Dose/Rate Route Frequency Ordered Stop   07/28/20 1000  azithromycin (ZITHROMAX) tablet 500 mg        500 mg Oral Daily 07/27/20 1214 07/31/20 0959   07/26/20 1345  cefTRIAXone (ROCEPHIN) 2 g in sodium chloride 0.9 % 100 mL IVPB        2 g 200 mL/hr over 30 Minutes Intravenous Daily 07/26/20 1333 07/31/20 0959   07/26/20 1345  azithromycin (ZITHROMAX) 500 mg in sodium chloride 0.9 % 250 mL IVPB  Status:  Discontinued        500 mg 250 mL/hr over 60 Minutes Intravenous Daily 07/26/20 1333 07/27/20 1214       Objective: Vitals:   07/28/20 1754 07/28/20 2003 07/29/20 0438 07/29/20 0748  BP: (!) 125/58 (!) 151/58 (!) 166/78 119/63  Pulse: 82 77 81 81  Resp: 16 16 20 18   Temp: 98.2 F (36.8  C) (!) 97.3 F (36.3 C) 98.3 F (36.8 C) 97.9 F (36.6 C)  TempSrc: Oral   Oral  SpO2: 97% 100% 95% 94%  Weight:      Height:        Intake/Output Summary (Last 24 hours) at 07/29/2020 1126 Last data filed at 07/29/2020 1011 Gross per 24 hour  Intake 480 ml  Output 500 ml  Net -20 ml   Filed Weights   07/26/20 1400  Weight: 91.8 kg    Examination: General exam: Appears calm and comfortable  Respiratory system: Clear to auscultation. Respiratory effort normal. No conversational dyspnea, remains on 3L O2  Cardiovascular system: S1 & S2 heard, RRR. No pedal  edema. Gastrointestinal system: Abdomen is nondistended, soft and nontender. Normal bowel sounds heard. Central nervous system: Alert and oriented. Non focal exam. Speech clear  Extremities: Symmetric in appearance bilaterally  Skin: No rashes, lesions or ulcers on exposed skin  Psychiatry: Judgement and insight appear stable. Mood & affect appropriate.    Data Reviewed: I have personally reviewed following labs and imaging studies  CBC: Recent Labs  Lab 07/26/20 1344 07/27/20 0436  WBC 8.1 9.3  NEUTROABS 5.8 6.8  HGB 10.4* 9.9*  HCT 30.4* 29.8*  MCV 96.5 98.0  PLT 214 366   Basic Metabolic Panel: Recent Labs  Lab 07/26/20 1344 07/27/20 0436 07/28/20 0434 07/29/20 0536  NA 138 138 139 140  K 4.8 5.2* 5.2* 5.7*  CL 107 109 109 109  CO2 21* 18* 20* 22  GLUCOSE 128* 113* 130* 140*  BUN 70* 67* 67* 68*  CREATININE 4.43* 3.72* 3.36* 3.02*  CALCIUM 9.2 8.9 8.9 9.2   GFR: Estimated Creatinine Clearance: 21.7 mL/min (A) (by C-G formula based on SCr of 3.02 mg/dL (H)). Liver Function Tests: Recent Labs  Lab 07/27/20 0436  AST 51*  ALT 29  ALKPHOS 84  BILITOT 0.7  PROT 7.2  ALBUMIN 2.8*   No results for input(s): LIPASE, AMYLASE in the last 168 hours. No results for input(s): AMMONIA in the last 168 hours. Coagulation Profile: No results for input(s): INR, PROTIME in the last 168 hours. Cardiac Enzymes: No results for input(s): CKTOTAL, CKMB, CKMBINDEX, TROPONINI in the last 168 hours. BNP (last 3 results) No results for input(s): PROBNP in the last 8760 hours. HbA1C: Recent Labs    07/26/20 1344  HGBA1C 5.8*   CBG: Recent Labs  Lab 07/28/20 0833 07/28/20 1243 07/28/20 1641 07/28/20 2130 07/29/20 0800  GLUCAP 185* 154* 188* 156* 159*   Lipid Profile: No results for input(s): CHOL, HDL, LDLCALC, TRIG, CHOLHDL, LDLDIRECT in the last 72 hours. Thyroid Function Tests: Recent Labs    07/26/20 1344  TSH 3.052   Anemia Panel: Recent Labs     07/26/20 2139  VITAMINB12 758  FOLATE 20.1  FERRITIN 589*  TIBC 165*  IRON 17*  RETICCTPCT 1.1   Sepsis Labs: Recent Labs  Lab 07/26/20 1344 07/26/20 1636 07/27/20 0436 07/28/20 0434  PROCALCITON 1.03  --  0.64 0.38  LATICACIDVEN 1.0 0.8  --   --     Recent Results (from the past 240 hour(s))  Culture, sputum-assessment     Status: None   Collection Time: 07/26/20  1:32 PM   Specimen: Sputum  Result Value Ref Range Status   Specimen Description SPUTUM  Final   Special Requests NONE  Final   Sputum evaluation   Final    THIS SPECIMEN IS ACCEPTABLE FOR SPUTUM CULTURE Performed at [facility], [address], [place], [place] [address]    Report Status 07/26/2020 FINAL  Final  Culture, Respiratory w Gram Stain     Status: None   Collection Time: 07/26/20  1:32 PM   Specimen: SPU  Result Value Ref Range Status   Specimen Description   Final    SPUTUM Performed at [facility], [address], [place], [place] [address]    Special Requests   Final    NONE Reflexed from [id number] Performed at [facility], [address] [place], [place] [address]    Gram Stain   Final    RARE WBC PRESENT, PREDOMINANTLY PMN FEW GRAM POSITIVE COCCI IN PAIRS IN CLUSTERS    Culture   Final    ABUNDANT Normal respiratory flora-no Staph aureus or Pseudomonas seen Performed at [facility], [place] [address] [place], [place] [address]    Report Status 07/29/2020 FINAL  Final  Respiratory (~20 pathogens) panel by PCR     Status: None   Collection Time: 07/26/20  1:33 PM   Specimen: Nasopharyngeal Swab; Respiratory  Result Value Ref Range Status   Adenovirus NOT DETECTED NOT DETECTED Final   Coronavirus 229E NOT DETECTED NOT DETECTED Final    Comment: (NOTE) The Coronavirus on the Respiratory Panel, DOES NOT test for the novel  Coronavirus (2019 nCoV)    Coronavirus HKU1 NOT DETECTED NOT DETECTED Final   Coronavirus NL63 NOT DETECTED NOT DETECTED Final    Coronavirus OC43 NOT DETECTED NOT DETECTED Final   Metapneumovirus NOT DETECTED NOT DETECTED Final   Rhinovirus / Enterovirus NOT DETECTED NOT DETECTED Final   Influenza A NOT DETECTED NOT DETECTED Final   Influenza B NOT DETECTED NOT DETECTED Final   Parainfluenza Virus 1 NOT DETECTED NOT DETECTED Final   Parainfluenza Virus 2 NOT DETECTED NOT DETECTED Final   Parainfluenza Virus 3 NOT DETECTED NOT DETECTED Final   Parainfluenza Virus 4 NOT DETECTED NOT DETECTED Final   Respiratory Syncytial Virus NOT DETECTED NOT DETECTED Final   Bordetella pertussis NOT DETECTED NOT DETECTED Final   Bordetella Parapertussis NOT DETECTED NOT DETECTED Final   Chlamydophila pneumoniae NOT DETECTED NOT DETECTED Final   Mycoplasma pneumoniae NOT DETECTED NOT DETECTED Final    Comment: Performed at [facility], [place] [address], [place], [place] [address]  MRSA PCR Screening     Status: None   Collection Time: 07/26/20  1:33 PM   Specimen: Nasopharyngeal  Result Value Ref Range Status   MRSA by PCR NEGATIVE NEGATIVE Final    Comment:        The GeneXpert MRSA Assay (FDA approved for NASAL specimens only), is one component of a comprehensive MRSA colonization surveillance program. It is not intended to diagnose MRSA infection nor to guide or monitor treatment for MRSA infections. Performed at [facility], [address] [place], [place] [address]   Culture, blood (routine x 2) Call MD if unable to obtain prior to antibiotics being given     Status: None (Preliminary result)   Collection Time: 07/26/20  1:44 PM   Specimen: BLOOD  Result Value Ref Range Status   Specimen Description BLOOD LEFT ANTECUBITAL  Final   Special Requests   Final    BOTTLES DRAWN AEROBIC AND ANAEROBIC Blood Culture adequate volume   Culture   Final    NO GROWTH 3 DAYS Performed at [facility], [address], [place], [place] [address]    Report Status PENDING  Incomplete  Culture, blood  (routine x 2) Call MD if unable to obtain prior to antibiotics being given     Status: None (Preliminary result)   Collection Time: 07/26/20  1:45 PM   Specimen: BLOOD  Result Value Ref Range Status   Specimen Description BLOOD BLOOD LEFT HAND  Final   Special Requests   Final    BOTTLES DRAWN AEROBIC AND ANAEROBIC Blood Culture adequate volume   Culture   Final    NO GROWTH 3 DAYS Performed at [facility], [address], [place], [place] [address]    Report Status PENDING  Incomplete  SARS CORONAVIRUS 2 (TAT 6-24 HRS) Nasopharyngeal Nasopharyngeal Swab     Status: None   Collection Time: 07/26/20 10:45 PM   Specimen: Nasopharyngeal Swab  Result Value Ref Range Status   SARS Coronavirus 2 NEGATIVE NEGATIVE Final    Comment: (NOTE) SARS-CoV-2 target nucleic acids are NOT DETECTED.  The SARS-CoV-2 RNA is generally detectable in upper and lower respiratory specimens during the acute phase of infection. Negative results do not preclude SARS-CoV-2 infection, do not rule out co-infections with other pathogens, and should not be used as the sole basis for treatment or other patient management decisions. Negative results must be combined with clinical observations, patient history, and epidemiological information. The expected result is Negative.  Fact Sheet for Patients: SugarRoll.be  Fact Sheet for Healthcare Providers: https://www.woods-mathews.com/  This test is not yet approved or cleared by the [place] FDA and  has been authorized for detection and/or diagnosis of SARS-CoV-2 by FDA under an Emergency Use Authorization (EUA). This EUA will remain  in effect (meaning this test can be used) for the duration of the COVID-19 declaration under Se ction 564(b)(1) of the Act, 21 U.S.C. section 360bbb-3(b)(1), unless the authorization is terminated or revoked sooner.  Performed at [facility], [place] [address],  [place], [place] [address]       Radiology Studies: No results found.    Scheduled Meds: . azithromycin  500 mg Oral Daily  . ezetimibe  10 mg Oral QHS  . feeding supplement  237 mL Oral BID BM  . heparin  5,000 Units Subcutaneous Q8H  . hydrALAZINE  25 mg Oral BID  . insulin aspart  0-9 Units Subcutaneous TID WC  . metoprolol succinate  50 mg Oral Daily  . multivitamin with minerals  1 tablet Oral Daily  . pantoprazole  40 mg Oral Daily  . tamsulosin  0.4 mg Oral Daily   Continuous Infusions: . cefTRIAXone (ROCEPHIN)  IV 2 g (07/29/20 0928)     LOS: 3 days      Time spent: 25 minutes   Dessa Phi, DO [facility] 07/29/2020, 11:26 AM   Available via Epic secure chat 7am-7pm After these hours, please refer to coverage provider listed on [email or web address]

## 2020-07-29 NOTE — Care Management Important Message (Signed)
Important Message  Patient Details  Name: [name] [name] MRN: [id number] Date of Birth: 19-May-1945   Medicare Important Message Given:  Yes     Dannette Barbara 07/29/2020, 12:52 PM

## 2020-07-29 NOTE — Evaluation (Signed)
Physical Therapy Evaluation Patient Details Name: [name] [name] MRN: [id number] DOB: Sep 13, 1945 Today's Date: 07/29/2020   History of Present Illness  [name] [name] is a 74 y.o. male with medical history significant of CAD,COPD on chronic O2,DMII,HLD, HTN,OSA ,Gout,GERD,BPH who presents as direct admit from PCP office due to failure of outpatient treatment of Pneumonia.  Clinical Impression  Patient received in recliner, he is pleasant, agreeable to PT session. Patient performs sit to stand transfers independently. Ambulated >300 feet without ad, supervision. No difficulties reported or noted. O2 saturations remained in 90%s on 3 lpm. Patient is at baseline level of function and does not require PT follow up. Patient can walk on unit at his leisure with assistance from staff as needed for line management.    Follow Up Recommendations No PT follow up    Equipment Recommendations  None recommended by PT    Recommendations for Other Services       Precautions / Restrictions Precautions Precautions: Fall Restrictions Weight Bearing Restrictions: No      Mobility  Bed Mobility               General bed mobility comments: Not assessed, patient in recliner    Transfers Overall transfer level: Independent Equipment used: None                Ambulation/Gait Ambulation/Gait assistance: Supervision Gait Distance (Feet): 300 Feet Assistive device: None Gait Pattern/deviations: WFL(Within Functional Limits) Gait velocity: WFL   General Gait Details: Patient ambulates without AD or physical assist, required assist to manage lines. No difficulties reported, O2 saturations remained in 90%s throughout session.  Stairs            Wheelchair Mobility    Modified Rankin (Stroke Patients Only)       Balance Overall balance assessment: Independent                                           Pertinent Vitals/Pain Pain  Assessment: No/denies pain    Home Living Family/patient expects to be discharged to:: Private residence Living Arrangements: Spouse/significant other Available Help at Discharge: Family;Available 24 hours/day Type of Home: House         Home Equipment: None      Prior Function Level of Independence: Independent         Comments: Patient reports he is independent at baseline, drives     Hand Dominance        Extremity/Trunk Assessment   Upper Extremity Assessment Upper Extremity Assessment: Overall WFL for tasks assessed    Lower Extremity Assessment Lower Extremity Assessment: Overall WFL for tasks assessed    Cervical / Trunk Assessment Cervical / Trunk Assessment: Normal  Communication   Communication: No difficulties  Cognition Arousal/Alertness: Awake/alert Behavior During Therapy: WFL for tasks assessed/performed Overall Cognitive Status: Within Functional Limits for tasks assessed                                        General Comments      Exercises     Assessment/Plan    PT Assessment Patent does not need any further PT services  PT Problem List         PT Treatment Interventions      PT Goals (Current goals can  be found in the Care Plan section)  Acute Rehab PT Goals Patient Stated Goal: to return home PT Goal Formulation: With patient Time For Goal Achievement: 07/30/20 Potential to Achieve Goals: Good    Frequency     Barriers to discharge        Co-evaluation               AM-PAC PT "6 Clicks" Mobility  Outcome Measure Help needed turning from your back to your side while in a flat bed without using bedrails?: None Help needed moving from lying on your back to sitting on the side of a flat bed without using bedrails?: None Help needed moving to and from a bed to a chair (including a wheelchair)?: None Help needed standing up from a chair using your arms (e.g., wheelchair or bedside chair)?: None Help  needed to walk in hospital room?: None Help needed climbing 3-5 steps with a railing? : None 6 Click Score: 24    End of Session Equipment Utilized During Treatment: Oxygen Activity Tolerance: Patient tolerated treatment well Patient left: in chair Nurse Communication: Mobility status      Time: 1050-1110 PT Time Calculation (min) (ACUTE ONLY): 20 min   Charges:   PT Evaluation $PT Eval Moderate Complexity: 1 Mod PT Treatments $Gait Training: 8-22 mins        Burrel Legrand, PT, GCS 07/29/20,11:56 AM

## 2020-07-30 DIAGNOSIS — G4733 Obstructive sleep apnea (adult) (pediatric): Secondary | ICD-10-CM

## 2020-07-30 DIAGNOSIS — E875 Hyperkalemia: Secondary | ICD-10-CM

## 2020-07-30 DIAGNOSIS — N400 Enlarged prostate without lower urinary tract symptoms: Secondary | ICD-10-CM

## 2020-07-30 DIAGNOSIS — K219 Gastro-esophageal reflux disease without esophagitis: Secondary | ICD-10-CM

## 2020-07-30 DIAGNOSIS — E119 Type 2 diabetes mellitus without complications: Secondary | ICD-10-CM

## 2020-07-30 DIAGNOSIS — I5042 Chronic combined systolic (congestive) and diastolic (congestive) heart failure: Secondary | ICD-10-CM

## 2020-07-30 DIAGNOSIS — I5020 Unspecified systolic (congestive) heart failure: Secondary | ICD-10-CM | POA: Insufficient documentation

## 2020-07-30 HISTORY — DX: Type 2 diabetes mellitus without complications: E11.9

## 2020-07-30 HISTORY — DX: Hyperkalemia: E87.5

## 2020-07-30 LAB — BASIC METABOLIC PANEL
Anion gap: 10 (ref 5–15)
BUN: 67 mg/dL — ABNORMAL HIGH (ref 8–23)
CO2: 21 mmol/L — ABNORMAL LOW (ref 22–32)
Calcium: 9.5 mg/dL (ref 8.9–10.3)
Chloride: 111 mmol/L (ref 98–111)
Creatinine, Ser: 2.78 mg/dL — ABNORMAL HIGH (ref 0.61–1.24)
GFR, Estimated: 23 mL/min — ABNORMAL LOW (ref >60)
Glucose, Bld: 131 mg/dL — ABNORMAL HIGH (ref 70–99)
Potassium: 5.4 mmol/L — ABNORMAL HIGH (ref 3.5–5.1)
Sodium: 142 mmol/L (ref 135–145)

## 2020-07-30 LAB — GLUCOSE, CAPILLARY
Glucose-Capillary: 139 mg/dL — ABNORMAL HIGH (ref 70–99)
Glucose-Capillary: 147 mg/dL — ABNORMAL HIGH (ref 70–99)

## 2020-07-30 MED ORDER — SODIUM ZIRCONIUM CYCLOSILICATE 10 G PO PACK
10.0000 g | PACK | Freq: Once | ORAL | Status: AC
  Administered 2020-07-30: 10 g via ORAL
  Filled 2020-07-30: qty 1

## 2020-07-30 MED ORDER — POLYETHYLENE GLYCOL 3350 17 G PO PACK
17.0000 g | PACK | Freq: Every day | ORAL | Status: DC
  Filled 2020-07-30: qty 1

## 2020-07-30 NOTE — Plan of Care (Signed)
Patient discharged to home with son who brought patient's home oxygen for transport.  Discharge instructions and follow up appointment need given and verbalized understanding.  PIV removed prior to discharge.  Escorted to main entrance via w/c for transport home.

## 2020-07-30 NOTE — Discharge Summary (Signed)
Physician Discharge Summary  [name] [name] [id number] DOB: October 14, 1945 DOA: 07/26/2020  PCP: Perrin Maltese, MD  Admit date: 07/26/2020 Discharge date: 07/30/2020  Admitted From: Home Disposition:  Home  Recommendations for Outpatient Follow-up:  1. Follow up with PCP in 1 week 2. Follow up with repeat BMP in 1 week to check creatinine and potassium level.  Losartan and Lasix on hold at time of discharge.  Depending on outpatient lab work, could resume at discretion of PCP.  Discharge Condition: Stable CODE STATUS: Full code Diet recommendation:  Diet Orders (From admission, onward)    Start     Ordered   07/26/20 1335  Diet Carb Modified Fluid consistency: Thin; Room service appropriate? Yes  Diet effective now       Question Answer Comment  Diet-HS Snack? Nothing   Calorie Level Medium 1600-2000   Fluid consistency: Thin   Room service appropriate? 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 On arrival to floor patient noted to have stable vitals with oxygen saturation of 97% on 3 L [place] chronic O2. Patient notes that he has been ill for the last two weeks with increased SOB, cough with tan brown sputum. He notes no associated chest pain, n/v/diarrhea but noted abdominal distention and increase indigestion. He states that he was placed on trial of antibiotics but due to persistent symptoms and increasing WBC, he was referred for admission. He noted fever at initial onset as well as chills.  Patient was admitted for community-acquired pneumonia with failure of outpatient antibiotic treatment.  He was treated with IV Rocephin, azithromycin.  Due to acute kidney injury, patient was also given IV fluid and also Lokelma for hyperkalemia.   On day of discharge, patient reported feeling much better, breathing back to his baseline and wished to go home.  He is to follow-up closely with his outpatient PCP with repeat lab work.  Discharge Diagnoses:  Principal Problem:   CAP (community acquired pneumonia) Active Problems:   AKI (acute kidney injury) ([place])   Chronic respiratory failure with hypoxia ([place])   CKD (chronic kidney disease), stage IV ([place])   Essential hypertension   Hyperkalemia   DM (diabetes mellitus), type 2 (HCC)   OSA on CPAP   Chronic combined systolic (congestive) and diastolic (congestive) heart failure (HCC)   BPH (benign prostatic hyperplasia)   GERD (gastroesophageal reflux disease)   CAP -Failure of outpatient antibiotic treatment -Procalcitonin trending downward -Completed course of Rocephin, azithromycin  Chronic hypoxemic respiratory failure -Uses 3 L oxygen at baseline -Continue  AKI on CKD stage IV -Baseline creatinine around 2.3 -Improving. Able to tolerate PO. Continue to hold losartan and lasix   Hyperkalemia -Lokelma x2   Hypertension -Hold losartan, Lasix due to AKI -Continue metoprolol, hydralazine  Diabetes mellitus type 2 -Hemoglobin A1c 5.8  OSA -CPAP nightly  Chronic combined systolic and diastolic CHF -Lasix on hold due to AKI -Without exacerbation, without peripheral edema, BNP 85.4  BPH -Continue tamsulosin  GERD -Continue PPI   Discharge Instructions  Discharge Instructions    (HEART FAILURE PATIENTS) Call MD:  Anytime you have any of the following symptoms: 1) 3 pound weight gain in 24 hours or 5 pounds in 1 week 2) shortness  of breath, with or without a dry hacking cough 3) swelling in the hands, feet or stomach 4) if you have to sleep on extra pillows at night in order to breathe.   Complete by: As directed    Call MD for:  difficulty breathing, headache or visual disturbances   Complete by: As directed    Call MD for:  extreme fatigue    Complete by: As directed    Call MD for:  persistant dizziness or light-headedness   Complete by: As directed    Call MD for:  persistant nausea and vomiting   Complete by: As directed    Call MD for:  severe uncontrolled pain   Complete by: As directed    Call MD for:  temperature >100.4   Complete by: As directed    Discharge instructions   Complete by: As directed    You were cared for by a hospitalist during your hospital stay. If you have any questions about your discharge medications or the care you received while you were in the hospital after you are discharged, you can call the unit and ask to speak with the hospitalist on call if the hospitalist that took care of you is not available. Once you are discharged, your primary care physician will handle any further medical issues. Please note that NO REFILLS for any discharge medications will be authorized once you are discharged, as it is imperative that you return to your primary care physician (or establish a relationship with a primary care physician if you do not have one) for your aftercare needs so that they can reassess your need for medications and monitor your lab values.   Increase activity slowly   Complete by: As directed      Allergies as of 07/30/2020      Reactions   Nexletol [bempedoic Acid] Diarrhea   fatigue   Nsaids    Avoid due to kidney disease       Medication List    STOP taking these medications   allopurinol 100 MG tablet Commonly known as: ZYLOPRIM   budesonide 0.5 MG/2ML nebulizer solution Commonly known as: PULMICORT   furosemide 20 MG tablet Commonly known as: LASIX   furosemide 40 MG tablet Commonly known as: LASIX   ipratropium-albuterol 0.5-2.5 (3) MG/3ML Soln Commonly known as: DUONEB   levofloxacin 250 MG tablet Commonly known as: LEVAQUIN   losartan 25 MG tablet Commonly known as: COZAAR   potassium chloride 10 MEQ tablet Commonly known as: KLOR-CON     TAKE these medications    acetaminophen 650 MG CR tablet Commonly known as: TYLENOL Take 1,300 mg by mouth every 8 (eight) hours as needed for pain.   albuterol 108 (90 Base) MCG/ACT inhaler Commonly known as: VENTOLIN HFA Inhale 2 puffs into the lungs every 6 (six) hours as needed for wheezing or shortness of breath.   aspirin 81 MG EC tablet Take 1 tablet (81 mg total) by mouth daily.   citalopram 40 MG tablet Commonly known as: CELEXA Take 40 mg by mouth daily. What changed: Another medication with the same name was removed. Continue taking this medication, and follow the directions you see here.   ezetimibe 10 MG tablet Commonly known as: ZETIA Take 10 mg by mouth at bedtime.   fexofenadine 180 MG tablet Commonly known as: ALLEGRA Take 180 mg by mouth daily as needed for allergies or rhinitis.   fluticasone 50 MCG/ACT nasal spray Commonly known as: FLONASE Place 2 sprays  into both nostrils daily. What changed:   when to take this  reasons to take this   fluticasone-salmeterol 115-21 MCG/ACT inhaler Commonly known as: ADVAIR HFA Inhale 2 puffs into the lungs 2 (two) times daily as needed (shortness of breath).   gabapentin 400 MG capsule Commonly known as: NEURONTIN Take 400 mg by mouth at bedtime. What changed: Another medication with the same name was changed. Make sure you understand how and when to take each.   gabapentin 100 MG capsule Commonly known as: NEURONTIN Take 2 capsules (200 mg total) by mouth 2 (two) times daily. What changed: when to take this   GAS-X PO Take 1 tablet by mouth 2 (two) times daily as needed (gas).   hydrALAZINE 25 MG tablet Commonly known as: APRESOLINE Take 25 mg by mouth in the morning and at bedtime.   methocarbamol 750 MG tablet Commonly known as: ROBAXIN Take 750 mg by mouth 2 (two) times daily as needed for muscle spasms.   metoprolol succinate 50 MG 24 hr tablet Commonly known as: TOPROL-XL Take 50 mg by mouth daily. Take with or  immediately following a meal.   NON FORMULARY cpap w/o2   OXYGEN Inhale into the lungs continuous.   pantoprazole 40 MG tablet Commonly known as: PROTONIX Take 40 mg by mouth daily.   PROBIOTIC PO Take 1 capsule by mouth daily.   sodium chloride 0.65 % Soln nasal spray Commonly known as: OCEAN Place 1 spray into both nostrils as needed for congestion.   SUPER B COMPLEX/C PO Take 1 tablet by mouth daily.   tamsulosin 0.4 MG Caps capsule Commonly known as: FLOMAX Take 1 capsule (0.4 mg total) by mouth daily.   vitamin C 1000 MG tablet Take 1,000 mg by mouth at bedtime.   Vitamin D 50 MCG (2000 UT) tablet Take 2,000 Units by mouth daily.       Follow-up Information    Perrin Maltese, MD Follow up.   Specialty: Internal Medicine Why: Follow up with repeat BMP in 1 week to check creatinine and potassium level.  Losartan and Lasix on hold at time of discharge.  Depending on outpatient lab work, could resume at discretion of PCP.  Contact information: [place] [address] [phone]              Allergies  Allergen Reactions  . Nexletol [Bempedoic Acid] Diarrhea    fatigue  . Nsaids     Avoid due to kidney disease       Procedures/Studies: DG Chest 1 View  Result Date: 07/26/2020 CLINICAL DATA:  Shortness of breath, history of CHF EXAM: CHEST  1 VIEW COMPARISON:  07/24/2016 FINDINGS: Cardiomegaly. Mild, diffuse interstitial pulmonary opacity. The visualized skeletal structures are unremarkable. IMPRESSION: Cardiomegaly with mild, diffuse interstitial pulmonary opacity, likely edema. No focal airspace opacity. Electronically Signed   By: Eddie Candle M.D.   On: 07/26/2020 14:00   ECHOCARDIOGRAM COMPLETE  Result Date: 07/29/2020    ECHOCARDIOGRAM REPORT   Patient Name:   REBEKAH SPRINKLE Date of Exam: 07/28/2020 Medical Rec #:  [id number]               Height:       65.0 in Accession #:    [id number]              Weight:       202.4 lb  Date of Birth:  Nov 09, 1945  BSA:          1.988 m Patient Age:    75 years                BP:           132/63 mmHg Patient Gender: M                       HR:           75 bpm. Exam Location:  [facility] Procedure: 2D Echo, Color Doppler and Cardiac Doppler Indications:     I50.9 Congestive Heart Failure  History:         Patient has prior history of Echocardiogram examinations. COPD;                  Risk Factors:Diabetes and Hypertension.  Sonographer:     Charmayne Sheer RDCS (AE) Referring Phys:  [place] Diagnosing Phys: Bartholome Bill MD  Sonographer Comments: Suboptimal parasternal window. IMPRESSIONS  1. Left ventricular ejection fraction, by estimation, is 50 to 55%. The left ventricle has low normal function. The left ventricle demonstrates global hypokinesis. There is mild left ventricular hypertrophy. Left ventricular diastolic parameters are consistent with Grade I diastolic dysfunction (impaired relaxation).  2. Right ventricular systolic function is normal. The right ventricular size is normal.  3. Left atrial size was mildly dilated.  4. Right atrial size was mildly dilated.  5. The mitral valve was not well visualized. Trivial mitral valve regurgitation. Moderate mitral annular calcification.  6. The aortic valve was not well visualized. Aortic valve regurgitation is trivial. FINDINGS  Left Ventricle: Left ventricular ejection fraction, by estimation, is 50 to 55%. The left ventricle has low normal function. The left ventricle demonstrates global hypokinesis. The left ventricular internal cavity size was normal in size. There is mild left ventricular hypertrophy. Left ventricular diastolic parameters are consistent with Grade I diastolic dysfunction (impaired relaxation). Right Ventricle: The right ventricular size is normal. No increase in right ventricular wall thickness. Right ventricular systolic function is normal. Left Atrium: Left atrial size was mildly dilated. Right Atrium:  Right atrial size was mildly dilated. Pericardium: There is no evidence of pericardial effusion. Mitral Valve: The mitral valve was not well visualized. Moderate mitral annular calcification. Trivial mitral valve regurgitation. MV peak gradient, 18.7 mmHg. The mean mitral valve gradient is 7.0 mmHg. Tricuspid Valve: The tricuspid valve is not well visualized. Tricuspid valve regurgitation is trivial. Aortic Valve: The aortic valve was not well visualized. Aortic valve regurgitation is trivial. Aortic valve mean gradient measures 7.0 mmHg. Aortic valve peak gradient measures 13.1 mmHg. Aortic valve area, by VTI measures 2.81 cm. Pulmonic Valve: The pulmonic valve was not well visualized. Pulmonic valve regurgitation is not visualized. Aorta: The aortic root is normal in size and structure, aortic root could not be assessed and the aortic root was not well visualized. IAS/Shunts: The interatrial septum was not well visualized.  LEFT VENTRICLE PLAX 2D LVIDd:         4.80 cm  Diastology LVIDs:         3.80 cm  LV e' medial:    6.64 cm/s LV PW:         1.70 cm  LV E/e' medial:  19.3 LV IVS:        1.10 cm  LV e' lateral:   4.68 cm/s LVOT diam:     2.40 cm  LV E/e' lateral: 27.4 LV SV:  99 LV SV Index:   50 LVOT Area:     4.52 cm  RIGHT VENTRICLE RV Basal diam:  3.40 cm TAPSE (M-mode): 2.2 cm LEFT ATRIUM             Index       RIGHT ATRIUM           Index LA diam:        4.90 cm 2.46 cm/m  RA Area:     13.10 cm LA Vol (A2C):   58.4 ml 29.37 ml/m RA Volume:   30.60 ml  15.39 ml/m LA Vol (A4C):   58.5 ml 29.42 ml/m LA Biplane Vol: 59.0 ml 29.68 ml/m  AORTIC VALVE                    PULMONIC VALVE AV Area (Vmax):    2.95 cm     PV Vmax:       0.88 m/s AV Area (Vmean):   2.64 cm     PV Vmean:      53.900 cm/s AV Area (VTI):     2.81 cm     PV VTI:        0.134 m AV Vmax:           181.00 cm/s  PV Peak grad:  3.1 mmHg AV Vmean:          123.000 cm/s PV Mean grad:  1.0 mmHg AV VTI:            0.352 m AV Peak  Grad:      13.1 mmHg AV Mean Grad:      7.0 mmHg LVOT Vmax:         118.00 cm/s LVOT Vmean:        71.900 cm/s LVOT VTI:          0.219 m LVOT/AV VTI ratio: 0.62  AORTA Ao Root diam: 2.90 cm MITRAL VALVE MV Area (PHT): 5.42 cm     SHUNTS MV Area VTI:   2.36 cm     Systemic VTI:  0.22 m MV Peak grad:  18.7 mmHg    Systemic Diam: 2.40 cm MV Mean grad:  7.0 mmHg MV Vmax:       2.16 m/s MV Vmean:      122.0 cm/s MV Decel Time: 140 msec MV E velocity: 128.00 cm/s MV A velocity: 215.00 cm/s MV E/A ratio:  0.60 Bartholome Bill MD Electronically signed by Bartholome Bill MD Signature Date/Time: 07/29/2020/12:28:34 PM    Final        Discharge Exam: Vitals:   07/30/20 0428 07/30/20 0854  BP: 140/61 (!) 148/73  Pulse: 81 81  Resp: (!) 24 18  Temp: (!) 97.5 F (36.4 C) (!) 97.5 F (36.4 C)  SpO2: 93% 97%    General: Pt is alert, awake, not in acute distress Cardiovascular: RRR, S1/S2 +, no edema Respiratory: CTA bilaterally, no wheezing, no rhonchi, no respiratory distress, no conversational dyspnea Abdominal: Soft, NT, ND, bowel sounds + Extremities: no edema, no cyanosis Psych: Normal mood and affect, stable judgement and insight     The results of significant diagnostics from this hospitalization (including imaging, microbiology, ancillary and laboratory) are listed below for reference.     Microbiology: Recent Results (from the past 240 hour(s))  Culture, sputum-assessment     Status: None   Collection Time: 07/26/20  1:32 PM   Specimen: Sputum  Result Value Ref Range Status   Specimen Description SPUTUM  Final   Special Requests NONE  Final   Sputum evaluation   Final    THIS SPECIMEN IS ACCEPTABLE FOR SPUTUM CULTURE Performed at [facility], [address] [place], [place] [address]    Report Status 07/26/2020 FINAL  Final  Culture, Respiratory w Gram Stain     Status: None   Collection Time: 07/26/20  1:32 PM   Specimen: SPU  Result Value Ref Range Status   Specimen  Description   Final    SPUTUM Performed at [facility], [address], [place], [place] [address]    Special Requests   Final    NONE Reflexed from [id number] Performed at [facility], [address] [place], [place] [address]    Gram Stain   Final    RARE WBC PRESENT, PREDOMINANTLY PMN FEW GRAM POSITIVE COCCI IN PAIRS IN CLUSTERS    Culture   Final    ABUNDANT Normal respiratory flora-no Staph aureus or Pseudomonas seen Performed at [facility], [place] [address], [place], [place] [address]    Report Status 07/29/2020 FINAL  Final  Respiratory (~20 pathogens) panel by PCR     Status: None   Collection Time: 07/26/20  1:33 PM   Specimen: Nasopharyngeal Swab; Respiratory  Result Value Ref Range Status   Adenovirus NOT DETECTED NOT DETECTED Final   Coronavirus 229E NOT DETECTED NOT DETECTED Final    Comment: (NOTE) The Coronavirus on the Respiratory Panel, DOES NOT test for the novel  Coronavirus (2019 nCoV)    Coronavirus HKU1 NOT DETECTED NOT DETECTED Final   Coronavirus NL63 NOT DETECTED NOT DETECTED Final   Coronavirus OC43 NOT DETECTED NOT DETECTED Final   Metapneumovirus NOT DETECTED NOT DETECTED Final   Rhinovirus / Enterovirus NOT DETECTED NOT DETECTED Final   Influenza A NOT DETECTED NOT DETECTED Final   Influenza B NOT DETECTED NOT DETECTED Final   Parainfluenza Virus 1 NOT DETECTED NOT DETECTED Final   Parainfluenza Virus 2 NOT DETECTED NOT DETECTED Final   Parainfluenza Virus 3 NOT DETECTED NOT DETECTED Final   Parainfluenza Virus 4 NOT DETECTED NOT DETECTED Final   Respiratory Syncytial Virus NOT DETECTED NOT DETECTED Final   Bordetella pertussis NOT DETECTED NOT DETECTED Final   Bordetella Parapertussis NOT DETECTED NOT DETECTED Final   Chlamydophila pneumoniae NOT DETECTED NOT DETECTED Final   Mycoplasma pneumoniae NOT DETECTED NOT DETECTED Final    Comment: Performed at [facility], [place] [address], [place], [place] [address]   MRSA PCR Screening     Status: None   Collection Time: 07/26/20  1:33 PM   Specimen: Nasopharyngeal  Result Value Ref Range Status   MRSA by PCR NEGATIVE NEGATIVE Final    Comment:        The GeneXpert MRSA Assay (FDA approved for NASAL specimens only), is one component of a comprehensive MRSA colonization surveillance program. It is not intended to diagnose MRSA infection nor to guide or monitor treatment for MRSA infections. Performed at [facility], [address] [place], [place] [address]   Culture, blood (routine x 2) Call MD if unable to obtain prior to antibiotics being given     Status: None (Preliminary result)   Collection Time: 07/26/20  1:44 PM   Specimen: BLOOD  Result Value Ref Range Status   Specimen Description BLOOD LEFT ANTECUBITAL  Final   Special Requests   Final    BOTTLES DRAWN AEROBIC AND ANAEROBIC Blood Culture adequate volume  Culture   Final    NO GROWTH 4 DAYS Performed at [facility], [address] [place], [place] [address]    Report Status PENDING  Incomplete  Culture, blood (routine x 2) Call MD if unable to obtain prior to antibiotics being given     Status: None (Preliminary result)   Collection Time: 07/26/20  1:45 PM   Specimen: BLOOD  Result Value Ref Range Status   Specimen Description BLOOD BLOOD LEFT HAND  Final   Special Requests   Final    BOTTLES DRAWN AEROBIC AND ANAEROBIC Blood Culture adequate volume   Culture   Final    NO GROWTH 4 DAYS Performed at [facility], [address], [place], [place] [address]    Report Status PENDING  Incomplete  SARS CORONAVIRUS 2 (TAT 6-24 HRS) Nasopharyngeal Nasopharyngeal Swab     Status: None   Collection Time: 07/26/20 10:45 PM   Specimen: Nasopharyngeal Swab  Result Value Ref Range Status   SARS Coronavirus 2 NEGATIVE NEGATIVE Final    Comment: (NOTE) SARS-CoV-2 target nucleic acids are NOT DETECTED.  The SARS-CoV-2 RNA is generally detectable in  upper and lower respiratory specimens during the acute phase of infection. Negative results do not preclude SARS-CoV-2 infection, do not rule out co-infections with other pathogens, and should not be used as the sole basis for treatment or other patient management decisions. Negative results must be combined with clinical observations, patient history, and epidemiological information. The expected result is Negative.  Fact Sheet for Patients: SugarRoll.be  Fact Sheet for Healthcare Providers: https://www.woods-mathews.com/  This test is not yet approved or cleared by the [place] FDA and  has been authorized for detection and/or diagnosis of SARS-CoV-2 by FDA under an Emergency Use Authorization (EUA). This EUA will remain  in effect (meaning this test can be used) for the duration of the COVID-19 declaration under Se ction 564(b)(1) of the Act, 21 U.S.C. section 360bbb-3(b)(1), unless the authorization is terminated or revoked sooner.  Performed at [facility], [place] [address] [place], [place] [address]      Labs: BNP (last 3 results) Recent Labs    07/26/20 1344  BNP 82.4   Basic Metabolic Panel: Recent Labs  Lab 07/27/20 0436 07/28/20 0434 07/29/20 0536 07/29/20 1736 07/30/20 0421  NA 138 139 140 140 142  K 5.2* 5.2* 5.7* 5.3* 5.4*  CL 109 109 109 109 111  CO2 18* 20* 22 20* 21*  GLUCOSE 113* 130* 140* 167* 131*  BUN 67* 67* 68* 64* 67*  CREATININE 3.72* 3.36* 3.02* 2.71* 2.78*  CALCIUM 8.9 8.9 9.2 9.1 9.5   Liver Function Tests: Recent Labs  Lab 07/27/20 0436  AST 51*  ALT 29  ALKPHOS 84  BILITOT 0.7  PROT 7.2  ALBUMIN 2.8*   No results for input(s): LIPASE, AMYLASE in the last 168 hours. No results for input(s): AMMONIA in the last 168 hours. CBC: Recent Labs  Lab 07/26/20 1344 07/27/20 0436  WBC 8.1 9.3  NEUTROABS 5.8 6.8  HGB 10.4* 9.9*  HCT 30.4* 29.8*  MCV 96.5 98.0  PLT 214 237    Cardiac Enzymes: No results for input(s): CKTOTAL, CKMB, CKMBINDEX, TROPONINI in the last 168 hours. BNP: Invalid input(s): POCBNP CBG: Recent Labs  Lab 07/29/20 0800 07/29/20 1144 07/29/20 1526 07/29/20 2057 07/30/20 0742  GLUCAP 159* 177* 166* 129* 139*   D-Dimer No results for input(s): DDIMER in the last 72 hours. Hgb A1c No results for  input(s): HGBA1C in the last 72 hours. Lipid Profile No results for input(s): CHOL, HDL, LDLCALC, TRIG, CHOLHDL, LDLDIRECT in the last 72 hours. Thyroid function studies No results for input(s): TSH, T4TOTAL, T3FREE, THYROIDAB in the last 72 hours.  Invalid input(s): FREET3 Anemia work up No results for input(s): VITAMINB12, FOLATE, FERRITIN, TIBC, IRON, RETICCTPCT in the last 72 hours. Urinalysis    Component Value Date/Time   COLORURINE YELLOW (A) 07/26/2020 1333   APPEARANCEUR CLOUDY (A) 07/26/2020 1333   APPEARANCEUR Clear 07/27/2014 1247   LABSPEC 1.015 07/26/2020 1333   LABSPEC 1.016 07/27/2014 1247   PHURINE 5.0 07/26/2020 1333   GLUCOSEU NEGATIVE 07/26/2020 1333   GLUCOSEU 50 mg/dL 07/27/2014 1247   HGBUR NEGATIVE 07/26/2020 1333   BILIRUBINUR NEGATIVE 07/26/2020 1333   BILIRUBINUR Negative 07/27/2014 1247   KETONESUR NEGATIVE 07/26/2020 1333   PROTEINUR 100 (A) 07/26/2020 1333   NITRITE NEGATIVE 07/26/2020 1333   LEUKOCYTESUR NEGATIVE 07/26/2020 1333   LEUKOCYTESUR Negative 07/27/2014 1247   Sepsis Labs Invalid input(s): PROCALCITONIN,  WBC,  LACTICIDVEN Microbiology Recent Results (from the past 240 hour(s))  Culture, sputum-assessment     Status: None   Collection Time: 07/26/20  1:32 PM   Specimen: Sputum  Result Value Ref Range Status   Specimen Description SPUTUM  Final   Special Requests NONE  Final   Sputum evaluation   Final    THIS SPECIMEN IS ACCEPTABLE FOR SPUTUM CULTURE Performed at [facility], [address] [place], [place] [address]    Report Status 07/26/2020 FINAL  Final   Culture, Respiratory w Gram Stain     Status: None   Collection Time: 07/26/20  1:32 PM   Specimen: SPU  Result Value Ref Range Status   Specimen Description   Final    SPUTUM Performed at [facility], [address] [place], [place] [address]    Special Requests   Final    NONE Reflexed from [id number] Performed at [facility], [address] [place], [place] [address]    Gram Stain   Final    RARE WBC PRESENT, PREDOMINANTLY PMN FEW GRAM POSITIVE COCCI IN PAIRS IN CLUSTERS    Culture   Final    ABUNDANT Normal respiratory flora-no Staph aureus or Pseudomonas seen Performed at [facility], [place] [address], [place], [place] [address]    Report Status 07/29/2020 FINAL  Final  Respiratory (~20 pathogens) panel by PCR     Status: None   Collection Time: 07/26/20  1:33 PM   Specimen: Nasopharyngeal Swab; Respiratory  Result Value Ref Range Status   Adenovirus NOT DETECTED NOT DETECTED Final   Coronavirus 229E NOT DETECTED NOT DETECTED Final    Comment: (NOTE) The Coronavirus on the Respiratory Panel, DOES NOT test for the novel  Coronavirus (2019 nCoV)    Coronavirus HKU1 NOT DETECTED NOT DETECTED Final   Coronavirus NL63 NOT DETECTED NOT DETECTED Final   Coronavirus OC43 NOT DETECTED NOT DETECTED Final   Metapneumovirus NOT DETECTED NOT DETECTED Final   Rhinovirus / Enterovirus NOT DETECTED NOT DETECTED Final   Influenza A NOT DETECTED NOT DETECTED Final   Influenza B NOT DETECTED NOT DETECTED Final   Parainfluenza Virus 1 NOT DETECTED NOT DETECTED Final   Parainfluenza Virus 2 NOT DETECTED NOT DETECTED Final   Parainfluenza Virus 3 NOT DETECTED NOT DETECTED Final   Parainfluenza Virus 4 NOT DETECTED NOT DETECTED Final   Respiratory Syncytial Virus NOT DETECTED NOT DETECTED Final  Bordetella pertussis NOT DETECTED NOT DETECTED Final   Bordetella Parapertussis NOT DETECTED NOT DETECTED Final   Chlamydophila pneumoniae NOT DETECTED NOT DETECTED Final    Mycoplasma pneumoniae NOT DETECTED NOT DETECTED Final    Comment: Performed at [facility], [place] [address] [place], [place] [address]  MRSA PCR Screening     Status: None   Collection Time: 07/26/20  1:33 PM   Specimen: Nasopharyngeal  Result Value Ref Range Status   MRSA by PCR NEGATIVE NEGATIVE Final    Comment:        The GeneXpert MRSA Assay (FDA approved for NASAL specimens only), is one component of a comprehensive MRSA colonization surveillance program. It is not intended to diagnose MRSA infection nor to guide or monitor treatment for MRSA infections. Performed at [facility], [address] [place], [place] [address]   Culture, blood (routine x 2) Call MD if unable to obtain prior to antibiotics being given     Status: None (Preliminary result)   Collection Time: 07/26/20  1:44 PM   Specimen: BLOOD  Result Value Ref Range Status   Specimen Description BLOOD LEFT ANTECUBITAL  Final   Special Requests   Final    BOTTLES DRAWN AEROBIC AND ANAEROBIC Blood Culture adequate volume   Culture   Final    NO GROWTH 4 DAYS Performed at [facility], [address], [place], [place] [address]    Report Status PENDING  Incomplete  Culture, blood (routine x 2) Call MD if unable to obtain prior to antibiotics being given     Status: None (Preliminary result)   Collection Time: 07/26/20  1:45 PM   Specimen: BLOOD  Result Value Ref Range Status   Specimen Description BLOOD BLOOD LEFT HAND  Final   Special Requests   Final    BOTTLES DRAWN AEROBIC AND ANAEROBIC Blood Culture adequate volume   Culture   Final    NO GROWTH 4 DAYS Performed at [facility], [address], [place], [place] [address]    Report Status PENDING  Incomplete  SARS CORONAVIRUS 2 (TAT 6-24 HRS) Nasopharyngeal Nasopharyngeal Swab     Status: None   Collection Time: 07/26/20 10:45 PM   Specimen: Nasopharyngeal Swab  Result Value Ref Range Status   SARS Coronavirus 2  NEGATIVE NEGATIVE Final    Comment: (NOTE) SARS-CoV-2 target nucleic acids are NOT DETECTED.  The SARS-CoV-2 RNA is generally detectable in upper and lower respiratory specimens during the acute phase of infection. Negative results do not preclude SARS-CoV-2 infection, do not rule out co-infections with other pathogens, and should not be used as the sole basis for treatment or other patient management decisions. Negative results must be combined with clinical observations, patient history, and epidemiological information. The expected result is Negative.  Fact Sheet for Patients: SugarRoll.be  Fact Sheet for Healthcare Providers: https://www.woods-mathews.com/  This test is not yet approved or cleared by the [place] FDA and  has been authorized for detection and/or diagnosis of SARS-CoV-2 by FDA under an Emergency Use Authorization (EUA). This EUA will remain  in effect (meaning this test can be used) for the duration of the COVID-19 declaration under Se ction 564(b)(1) of the Act, 21 U.S.C. section 360bbb-3(b)(1), unless the authorization is terminated or revoked sooner.  Performed at [facility], [place] [address] [place], [place] [address]      Patient was seen and examined on the day of discharge and was found to be  in stable condition. Time coordinating discharge: 25 minutes including assessment and coordination of care, as well as examination of the patient.   SIGNED:  Dessa Phi, DO [facility] 07/30/2020, 10:21 AM

## 2020-07-31 LAB — CULTURE, BLOOD (ROUTINE X 2)
Culture: NO GROWTH
Culture: NO GROWTH
Special Requests: ADEQUATE
Special Requests: ADEQUATE

## 2020-09-06 ENCOUNTER — Other Ambulatory Visit (HOSPITAL_COMMUNITY): Payer: Self-pay | Admitting: Physician Assistant

## 2020-09-06 ENCOUNTER — Other Ambulatory Visit: Payer: Self-pay | Admitting: Physician Assistant

## 2020-09-06 DIAGNOSIS — M2392 Unspecified internal derangement of left knee: Secondary | ICD-10-CM

## 2020-09-17 ENCOUNTER — Other Ambulatory Visit: Payer: Self-pay

## 2020-09-17 ENCOUNTER — Ambulatory Visit
Admission: RE | Admit: 2020-09-17 | Discharge: 2020-09-17 | Disposition: A | Discharge status: Home / Self Care | Payer: Medicare HMO | Source: Ambulatory Visit | Attending: Physician Assistant | Admitting: Physician Assistant

## 2020-09-17 DIAGNOSIS — M2392 Unspecified internal derangement of left knee: Secondary | ICD-10-CM | POA: Diagnosis not present

## 2020-09-28 DIAGNOSIS — J449 Chronic obstructive pulmonary disease, unspecified: Secondary | ICD-10-CM | POA: Diagnosis not present

## 2020-09-28 DIAGNOSIS — G4733 Obstructive sleep apnea (adult) (pediatric): Secondary | ICD-10-CM | POA: Diagnosis not present

## 2020-09-29 DIAGNOSIS — R809 Proteinuria, unspecified: Secondary | ICD-10-CM | POA: Diagnosis not present

## 2020-09-29 DIAGNOSIS — E1122 Type 2 diabetes mellitus with diabetic chronic kidney disease: Secondary | ICD-10-CM | POA: Diagnosis not present

## 2020-09-29 DIAGNOSIS — N050 Unspecified nephritic syndrome with minor glomerular abnormality: Secondary | ICD-10-CM | POA: Diagnosis not present

## 2020-09-29 DIAGNOSIS — N184 Chronic kidney disease, stage 4 (severe): Secondary | ICD-10-CM | POA: Diagnosis not present

## 2020-09-29 DIAGNOSIS — N2581 Secondary hyperparathyroidism of renal origin: Secondary | ICD-10-CM | POA: Diagnosis not present

## 2020-09-29 DIAGNOSIS — I129 Hypertensive chronic kidney disease with stage 1 through stage 4 chronic kidney disease, or unspecified chronic kidney disease: Secondary | ICD-10-CM | POA: Diagnosis not present

## 2020-10-12 DIAGNOSIS — G4733 Obstructive sleep apnea (adult) (pediatric): Secondary | ICD-10-CM | POA: Diagnosis not present

## 2020-10-18 DIAGNOSIS — M1712 Unilateral primary osteoarthritis, left knee: Secondary | ICD-10-CM | POA: Insufficient documentation

## 2020-10-18 DIAGNOSIS — S83232A Complex tear of medial meniscus, current injury, left knee, initial encounter: Secondary | ICD-10-CM

## 2020-10-18 DIAGNOSIS — R1030 Lower abdominal pain, unspecified: Secondary | ICD-10-CM | POA: Diagnosis not present

## 2020-10-18 DIAGNOSIS — M6752 Plica syndrome, left knee: Secondary | ICD-10-CM | POA: Insufficient documentation

## 2020-10-18 DIAGNOSIS — K5900 Constipation, unspecified: Secondary | ICD-10-CM | POA: Diagnosis not present

## 2020-10-18 DIAGNOSIS — K56609 Unspecified intestinal obstruction, unspecified as to partial versus complete obstruction: Secondary | ICD-10-CM | POA: Diagnosis not present

## 2020-10-18 DIAGNOSIS — R140 Abdominal distension (gaseous): Secondary | ICD-10-CM | POA: Diagnosis not present

## 2020-10-18 HISTORY — DX: Complex tear of medial meniscus, current injury, left knee, initial encounter: S83.232A

## 2020-10-19 DIAGNOSIS — R1030 Lower abdominal pain, unspecified: Secondary | ICD-10-CM | POA: Diagnosis not present

## 2020-10-19 DIAGNOSIS — K5900 Constipation, unspecified: Secondary | ICD-10-CM | POA: Diagnosis not present

## 2020-10-19 DIAGNOSIS — R140 Abdominal distension (gaseous): Secondary | ICD-10-CM | POA: Diagnosis not present

## 2020-10-19 DIAGNOSIS — K56609 Unspecified intestinal obstruction, unspecified as to partial versus complete obstruction: Secondary | ICD-10-CM | POA: Diagnosis not present

## 2020-10-20 DIAGNOSIS — R935 Abnormal findings on diagnostic imaging of other abdominal regions, including retroperitoneum: Secondary | ICD-10-CM | POA: Diagnosis not present

## 2020-10-20 DIAGNOSIS — R1030 Lower abdominal pain, unspecified: Secondary | ICD-10-CM | POA: Diagnosis not present

## 2020-10-20 DIAGNOSIS — N410 Acute prostatitis: Secondary | ICD-10-CM | POA: Diagnosis not present

## 2020-10-20 LAB — PSA: PSA: 1.1

## 2020-10-21 DIAGNOSIS — I2510 Atherosclerotic heart disease of native coronary artery without angina pectoris: Secondary | ICD-10-CM | POA: Diagnosis not present

## 2020-10-21 DIAGNOSIS — I10 Essential (primary) hypertension: Secondary | ICD-10-CM | POA: Diagnosis not present

## 2020-10-21 DIAGNOSIS — G4739 Other sleep apnea: Secondary | ICD-10-CM | POA: Diagnosis not present

## 2020-10-21 DIAGNOSIS — E782 Mixed hyperlipidemia: Secondary | ICD-10-CM | POA: Diagnosis not present

## 2020-10-21 DIAGNOSIS — R0602 Shortness of breath: Secondary | ICD-10-CM | POA: Diagnosis not present

## 2020-10-28 DIAGNOSIS — J449 Chronic obstructive pulmonary disease, unspecified: Secondary | ICD-10-CM | POA: Diagnosis not present

## 2020-10-28 DIAGNOSIS — G4733 Obstructive sleep apnea (adult) (pediatric): Secondary | ICD-10-CM | POA: Diagnosis not present

## 2020-11-03 ENCOUNTER — Ambulatory Visit: Payer: Medicare HMO

## 2020-11-05 DIAGNOSIS — E663 Overweight: Secondary | ICD-10-CM | POA: Diagnosis not present

## 2020-11-05 DIAGNOSIS — J449 Chronic obstructive pulmonary disease, unspecified: Secondary | ICD-10-CM | POA: Diagnosis not present

## 2020-11-05 DIAGNOSIS — G4739 Other sleep apnea: Secondary | ICD-10-CM | POA: Diagnosis not present

## 2020-11-05 DIAGNOSIS — E1169 Type 2 diabetes mellitus with other specified complication: Secondary | ICD-10-CM | POA: Diagnosis not present

## 2020-11-05 DIAGNOSIS — I10 Essential (primary) hypertension: Secondary | ICD-10-CM | POA: Diagnosis not present

## 2020-11-05 DIAGNOSIS — I2510 Atherosclerotic heart disease of native coronary artery without angina pectoris: Secondary | ICD-10-CM | POA: Diagnosis not present

## 2020-11-05 DIAGNOSIS — E781 Pure hyperglyceridemia: Secondary | ICD-10-CM | POA: Diagnosis not present

## 2020-11-17 DIAGNOSIS — B351 Tinea unguium: Secondary | ICD-10-CM | POA: Diagnosis not present

## 2020-11-17 DIAGNOSIS — E1142 Type 2 diabetes mellitus with diabetic polyneuropathy: Secondary | ICD-10-CM | POA: Diagnosis not present

## 2020-11-18 DIAGNOSIS — E782 Mixed hyperlipidemia: Secondary | ICD-10-CM | POA: Diagnosis not present

## 2020-11-18 DIAGNOSIS — J301 Allergic rhinitis due to pollen: Secondary | ICD-10-CM | POA: Diagnosis not present

## 2020-11-18 DIAGNOSIS — H6093 Unspecified otitis externa, bilateral: Secondary | ICD-10-CM | POA: Diagnosis not present

## 2020-11-18 DIAGNOSIS — M109 Gout, unspecified: Secondary | ICD-10-CM | POA: Diagnosis not present

## 2020-11-18 DIAGNOSIS — J449 Chronic obstructive pulmonary disease, unspecified: Secondary | ICD-10-CM | POA: Diagnosis not present

## 2020-11-18 DIAGNOSIS — I10 Essential (primary) hypertension: Secondary | ICD-10-CM | POA: Diagnosis not present

## 2020-11-18 DIAGNOSIS — R5383 Other fatigue: Secondary | ICD-10-CM | POA: Diagnosis not present

## 2020-11-18 DIAGNOSIS — E1169 Type 2 diabetes mellitus with other specified complication: Secondary | ICD-10-CM | POA: Diagnosis not present

## 2020-11-19 DIAGNOSIS — E782 Mixed hyperlipidemia: Secondary | ICD-10-CM | POA: Diagnosis not present

## 2020-11-19 DIAGNOSIS — I10 Essential (primary) hypertension: Secondary | ICD-10-CM | POA: Diagnosis not present

## 2020-11-19 DIAGNOSIS — E559 Vitamin D deficiency, unspecified: Secondary | ICD-10-CM | POA: Diagnosis not present

## 2020-11-19 DIAGNOSIS — E1169 Type 2 diabetes mellitus with other specified complication: Secondary | ICD-10-CM | POA: Diagnosis not present

## 2020-11-19 DIAGNOSIS — E039 Hypothyroidism, unspecified: Secondary | ICD-10-CM | POA: Diagnosis not present

## 2020-11-23 ENCOUNTER — Other Ambulatory Visit: Payer: Self-pay | Admitting: Surgery

## 2020-11-24 ENCOUNTER — Encounter: Payer: Self-pay | Admitting: Dermatology

## 2020-11-24 ENCOUNTER — Ambulatory Visit: Payer: Medicare HMO | Admitting: Dermatology

## 2020-11-24 ENCOUNTER — Other Ambulatory Visit: Payer: Self-pay

## 2020-11-24 ENCOUNTER — Ambulatory Visit (INDEPENDENT_AMBULATORY_CARE_PROVIDER_SITE_OTHER): Payer: Medicare HMO

## 2020-11-24 DIAGNOSIS — Z85828 Personal history of other malignant neoplasm of skin: Secondary | ICD-10-CM

## 2020-11-24 DIAGNOSIS — C4491 Basal cell carcinoma of skin, unspecified: Secondary | ICD-10-CM

## 2020-11-24 DIAGNOSIS — C4441 Basal cell carcinoma of skin of scalp and neck: Secondary | ICD-10-CM

## 2020-11-24 DIAGNOSIS — L570 Actinic keratosis: Secondary | ICD-10-CM

## 2020-11-24 DIAGNOSIS — G4733 Obstructive sleep apnea (adult) (pediatric): Secondary | ICD-10-CM

## 2020-11-24 DIAGNOSIS — D492 Neoplasm of unspecified behavior of bone, soft tissue, and skin: Secondary | ICD-10-CM

## 2020-11-24 HISTORY — DX: Basal cell carcinoma of skin, unspecified: C44.91

## 2020-11-24 NOTE — Progress Notes (Signed)
Follow-Up Visit   Subjective  [name] [name] is a 75 y.o. male who presents for the following: lesion (Patient c/o lesion on right side of neck. Dur: 2 months. Itches. Patient scratches at area and it will bleed. Tender since using bandaid. ). Also has bump on back of ear.  He has h/o skin cancer    The following portions of the chart were reviewed this encounter and updated as appropriate:      Review of Systems: No other skin or systemic complaints except as noted in HPI or Assessment and Plan.   Objective  Well appearing patient in no apparent distress; mood and affect are within normal limits.  A focused examination was performed including head, including the scalp, face, neck, nose, ears, eyelids, and lips. Relevant physical exam findings are noted in the Assessment and Plan.  Right Neck below ear 9 x 6 mm pearly pink depressed macule     right posterior ear Keratotic papule   Assessment & Plan  Neoplasm of skin Right Neck below ear  Skin / nail biopsy Type of biopsy: tangential   Informed consent: discussed and consent obtained   Anesthesia: the lesion was anesthetized in a standard fashion   Anesthesia comment:  Area prepped with alcohol Anesthetic:  1% lidocaine w/ epinephrine 1-100,000 buffered w/ 8.4% NaHCO3 Instrument used: flexible razor blade   Hemostasis achieved with: pressure, aluminum chloride and electrodesiccation   Outcome: patient tolerated procedure well    Destruction of lesion  Destruction method: electrodesiccation and curettage   Informed consent: discussed and consent obtained   Timeout:  patient name, date of birth, surgical site, and procedure verified Patient was prepped and draped in usual sterile fashion: area prepped with alcohol. Curettage performed in three different directions: Yes   Electrodesiccation performed over the curetted area: Yes   Lesion length (cm):  0.9 Lesion width (cm):  0.6 Final wound size (cm):   1 Hemostasis achieved with:  pressure, aluminum chloride and electrodesiccation Outcome: patient tolerated procedure well with no complications   Post-procedure details: wound care instructions given   Post-procedure details comment:  Ointment and small bandage applied  Specimen 1 - Surgical pathology Differential Diagnosis: Scar, R/O BCC  Check Margins: No  AK (actinic keratosis) right posterior ear  Hypertrophic  Actinic keratoses are precancerous spots that appear secondary to cumulative UV radiation exposure/sun exposure over time. They are chronic with expected duration over 1 year. A portion of actinic keratoses will progress to squamous cell carcinoma of the skin. It is not possible to reliably predict which spots will progress to skin cancer and so treatment is recommended to prevent development of skin cancer.  Recommend daily broad spectrum sunscreen SPF 30+ to sun-exposed areas, reapply every 2 hours as needed.  Recommend staying in the shade or wearing long sleeves, sun glasses (UVA+UVB protection) and wide brim hats (4-inch brim around the entire circumference of the hat). Call for new or changing lesions.  Destruction of lesion - right posterior ear  Destruction method: cryotherapy   Informed consent: discussed and consent obtained   Lesion destroyed using liquid nitrogen: Yes   Region frozen until ice ball extended beyond lesion: Yes   Outcome: patient tolerated procedure well with no complications   Post-procedure details: wound care instructions given   Additional details:  Prior to procedure, discussed risks of blister formation, small wound, skin dyspigmentation, or rare scar following cryotherapy. Recommend Vaseline ointment to treated areas while healing.   Return in about 3  months (around 02/24/2021) for biopsy site recheck.  I, Emelia Salisbury, CMA, am acting as scribe for Brendolyn Patty, MD.  Documentation: I have reviewed the above documentation for accuracy and  completeness, and I agree with the above.  Brendolyn Patty MD

## 2020-11-24 NOTE — Progress Notes (Signed)
95 percentile pressure 90   95th percentile leak 0   apnea index 0.2 /hr  apnea-hypopnea index  0.4 /hr   total days used  >4 hr 90 days  total days used <4 hr 0 days  Total compliance 100 percent   He is doing great wearing cpap. No problems or questions at this time.  He has been in hospital with increased C02 levels I feel he would benefit greatly going to an [facility].

## 2020-11-24 NOTE — Patient Instructions (Signed)
Wound Care Instructions  Cleanse wound gently with soap and water once a day then pat dry with clean gauze. Apply a thing coat of Petrolatum (petroleum jelly, "Vaseline") over the wound (unless you have an allergy to this). We recommend that you use a new, sterile tube of Vaseline. Do not pick or remove scabs. Do not remove the yellow or white "healing tissue" from the base of the wound.  Cover the wound with fresh, clean, nonstick gauze and secure with paper tape. You may use Band-Aids in place of gauze and tape if the would is small enough, but would recommend trimming much of the tape off as there is often too much. Sometimes Band-Aids can irritate the skin.  You should call the office for your biopsy report after 1 week if you have not already been contacted.  If you experience any problems, such as abnormal amounts of bleeding, swelling, significant bruising, significant pain, or evidence of infection, please call the office immediately.  FOR ADULT SURGERY PATIENTS: If you need something for pain relief you may take 1 extra strength Tylenol (acetaminophen) AND 2 Ibuprofen (200mg each) together every 4 hours as needed for pain. (do not take these if you are allergic to them or if you have a reason you should not take them.) Typically, you may only need pain medication for 1 to 3 days.   If you have any questions or concerns for your doctor, please call our main line at [phone] and press option 4 to reach your doctor's medical assistant. If no one answers, please leave a voicemail as directed and we will return your call as soon as possible. Messages left after 4 pm will be answered the following business day.   You may also send us a message via MyChart. We typically respond to MyChart messages within 1-2 business days.  For prescription refills, please ask your pharmacy to contact our office. Our fax number is [phone].  If you have an urgent issue when the clinic is closed that  cannot wait until the next business day, you can page your doctor at the number below.    Please note that while we do our best to be available for urgent issues outside of office hours, we are not available 24/7.   If you have an urgent issue and are unable to reach us, you may choose to seek medical care at your doctor's office, retail clinic, urgent care center, or emergency room.  If you have a medical emergency, please immediately call 911 or go to the emergency department.  Pager Numbers  - Dr. Kowalski: [phone]  - Dr. Moye: [phone]  - Dr. Stewart: [phone]  In the event of inclement weather, please call our main line at [phone] for an update on the status of any delays or closures.  Dermatology Medication Tips: Please keep the boxes that topical medications come in in order to help keep track of the instructions about where and how to use these. Pharmacies typically print the medication instructions only on the boxes and not directly on the medication tubes.   If your medication is too expensive, please contact our office at [phone] option 4 or send us a message through MyChart.   We are unable to tell what your co-pay for medications will be in advance as this is different depending on your insurance coverage. However, we may be able to find a substitute medication at lower cost or fill out paperwork to get insurance to cover a needed   medication.   If a prior authorization is required to get your medication covered by your insurance company, please allow us 1-2 business days to complete this process.  Drug prices often vary depending on where the prescription is filled and some pharmacies may offer cheaper prices.  The website www.goodrx.com contains coupons for medications through different pharmacies. The prices here do not account for what the cost may be with help from insurance (it may be cheaper with your insurance), but the website can give you the  price if you did not use any insurance.  - You can print the associated coupon and take it with your prescription to the pharmacy.  - You may also stop by our office during regular business hours and pick up a GoodRx coupon card.  - If you need your prescription sent electronically to a different pharmacy, notify our office through [facility] [place] MyChart or by phone at [phone] option 4.   

## 2020-11-26 ENCOUNTER — Other Ambulatory Visit: Payer: Self-pay

## 2020-11-26 ENCOUNTER — Encounter
Admission: RE | Admit: 2020-11-26 | Discharge: 2020-11-26 | Disposition: A | Discharge status: Home / Self Care | Payer: Medicare HMO | Source: Ambulatory Visit | Attending: Surgery | Admitting: Surgery

## 2020-11-26 HISTORY — DX: Personal history of other diseases of the digestive system: Z87.19

## 2020-11-26 HISTORY — DX: Cardiac murmur, unspecified: R01.1

## 2020-11-26 HISTORY — DX: Headache, unspecified: R51.9

## 2020-11-26 HISTORY — DX: Hyperkalemia: E87.5

## 2020-11-26 HISTORY — DX: Encounter for adoption services: Z02.82

## 2020-11-26 HISTORY — DX: Unspecified osteoarthritis, unspecified site: M19.90

## 2020-11-26 HISTORY — DX: Other specified health status: Z78.9

## 2020-11-26 HISTORY — DX: Sleep apnea, unspecified: G47.30

## 2020-11-26 HISTORY — DX: Dyspnea, unspecified: R06.00

## 2020-11-26 HISTORY — DX: Personal history of urinary calculi: Z87.442

## 2020-11-26 NOTE — Patient Instructions (Addendum)
Your procedure is scheduled on:12-02-20 Thursday Report to the Registration Desk on the 1st floor of the [facility]-Then proceed to the 2nd floor Surgery Desk in the [facility] To find out your arrival time, please call [phone] between 1PM - 3PM on:12-01-20 Wednesday  REMEMBER: Instructions that are not followed completely may result in serious medical risk, up to and including death; or upon the discretion of your surgeon and anesthesiologist your surgery may need to be rescheduled.  Do not eat food after midnight the night before surgery.  No gum chewing, lozengers or hard candies.  You may however, drink CLEAR liquids up to 2 hours before you are scheduled to arrive for your surgery. Do not drink anything within 2 hours of your scheduled arrival time.  Clear liquids include: - water  - apple juice without pulp - gatorade  - black coffee or tea (Do NOT add milk or creamers to the coffee or tea) Do NOT drink anything that is not on this list.  In addition, your doctor has ordered for you to drink the provided  Ensure Pre-Surgery Clear Carbohydrate Drink  Drinking this carbohydrate drink up to two hours before surgery helps to reduce insulin resistance and improve patient outcomes. Please complete drinking 2 hours prior to scheduled arrival time.  TAKE THESE MEDICATIONS THE MORNING OF SURGERY WITH A SIP OF WATER: -Citalopram (Celexa) -Gabapentin (Neurontin) -Metoprolol (Toprol-XL) -Tamsulosin (Flomax) -Protonix (Pantoprazole)-take one the night before and one on the morning of surgery - helps to prevent nausea after surgery.)  Use your Albuterol Inhaler the day of surgery and bring your Albuterol Inhaler to the hospital  Call Dr Poggi's office today (11-26-20) to see when your 81 mg Aspirin needs to be stopped  One week prior to surgery: Stop Anti-inflammatories (NSAIDS) such as Advil, Aleve, Ibuprofen, Motrin, Naproxen, Naprosyn and Aspirin based products such as Excedrin,  Goodys Powder, BC Powder.You may however, continue to take Tylenol if needed for pain up until the day of surgery.  Stop ANY OVER THE COUNTER supplements/vitamins NOW (11-26-20) until after surgery (Vitamin C, Vitamin D, Omega 3 Fish Oil, Probiotic, Super B Complex)  No Alcohol for 24 hours before or after surgery.  No Smoking including e-cigarettes for 24 hours prior to surgery.  No chewable tobacco products for at least 6 hours prior to surgery.  No nicotine patches on the day of surgery.  Do not use any "recreational" drugs for at least a week prior to your surgery.  Please be advised that the combination of cocaine and anesthesia may have negative outcomes, up to and including death. If you test positive for cocaine, your surgery will be cancelled.  On the morning of surgery brush your teeth with toothpaste and water, you may rinse your mouth with mouthwash if you wish. Do not swallow any toothpaste or mouthwash.  Do not wear jewelry, make-up, hairpins, clips or nail polish.  Do not wear lotions, powders, or perfumes.   Do not shave body from the neck down 48 hours prior to surgery just in case you cut yourself which could leave a site for infection.  Also, freshly shaved skin may become irritated if using the CHG soap.  Contact lenses, hearing aids and dentures may not be worn into surgery.  Do not bring valuables to the hospital. [facility] is not responsible for any missing/lost belongings or valuables.   Use CHG Soap as directed on instruction sheet.  Bring your C-PAP to the hospital with you  Notify your doctor if there is any change in your medical condition (cold, fever, infection).  Wear comfortable clothing (specific to your surgery type) to the hospital.  After surgery, you can help prevent lung complications by doing breathing exercises.  Take deep breaths and cough every 1-2 hours. Your doctor may order a device called an Incentive Spirometer to help you take deep  breaths. When coughing or sneezing, hold a pillow firmly against your incision with both hands. This is called "splinting." Doing this helps protect your incision. It also decreases belly discomfort.  If you are being admitted to the hospital overnight, leave your suitcase in the car. After surgery it may be brought to your room.  If you are being discharged the day of surgery, you will not be allowed to drive home. You will need a responsible adult (18 years or older) to drive you home and stay with you that night.   If you are taking public transportation, you will need to have a responsible adult (18 years or older) with you. Please confirm with your physician that it is acceptable to use public transportation.   Please call the [place] Dept. at [phone] if you have any questions about these instructions.  Surgery Visitation Policy:  Patients undergoing a surgery or procedure may have one family member or support person with them as long as that person is not COVID-19 positive or experiencing its symptoms.  That person may remain in the waiting area during the procedure.  Inpatient Visitation:    Visiting hours are 7 a.m. to 8 p.m. Inpatients will be allowed two visitors daily. The visitors may change each day during the patient's stay. No visitors under the age of 59. Any visitor under the age of 67 must be accompanied by an adult. The visitor must pass COVID-19 screenings, use hand sanitizer when entering and exiting the patient's room and wear a mask at all times, including in the patient's room. Patients must also wear a mask when staff or their visitor are in the room. Masking is required regardless of vaccination status.

## 2020-11-28 DIAGNOSIS — J449 Chronic obstructive pulmonary disease, unspecified: Secondary | ICD-10-CM | POA: Diagnosis not present

## 2020-11-28 DIAGNOSIS — G4733 Obstructive sleep apnea (adult) (pediatric): Secondary | ICD-10-CM | POA: Diagnosis not present

## 2020-11-29 ENCOUNTER — Encounter: Payer: Self-pay | Admitting: Surgery

## 2020-11-29 ENCOUNTER — Other Ambulatory Visit: Payer: Self-pay | Admitting: Internal Medicine

## 2020-11-29 ENCOUNTER — Other Ambulatory Visit: Payer: Self-pay

## 2020-11-29 ENCOUNTER — Ambulatory Visit: Payer: Medicare HMO | Admitting: Internal Medicine

## 2020-11-29 ENCOUNTER — Encounter
Admission: RE | Admit: 2020-11-29 | Discharge: 2020-11-29 | Disposition: A | Discharge status: Home / Self Care | Payer: Medicare HMO | Source: Ambulatory Visit | Attending: Surgery | Admitting: Surgery

## 2020-11-29 ENCOUNTER — Ambulatory Visit (HOSPITAL_BASED_OUTPATIENT_CLINIC_OR_DEPARTMENT_OTHER): Payer: Medicare HMO

## 2020-11-29 ENCOUNTER — Encounter: Payer: Self-pay | Admitting: Internal Medicine

## 2020-11-29 VITALS — BP 164/64 | HR 61 | Temp 98.5°F | Resp 16 | Ht 65.0 in | Wt 202.8 lb

## 2020-11-29 DIAGNOSIS — I509 Heart failure, unspecified: Secondary | ICD-10-CM

## 2020-11-29 DIAGNOSIS — Z9981 Dependence on supplemental oxygen: Secondary | ICD-10-CM

## 2020-11-29 DIAGNOSIS — J449 Chronic obstructive pulmonary disease, unspecified: Secondary | ICD-10-CM

## 2020-11-29 DIAGNOSIS — I10 Essential (primary) hypertension: Secondary | ICD-10-CM | POA: Insufficient documentation

## 2020-11-29 DIAGNOSIS — Z01818 Encounter for other preprocedural examination: Secondary | ICD-10-CM | POA: Insufficient documentation

## 2020-11-29 DIAGNOSIS — G4733 Obstructive sleep apnea (adult) (pediatric): Secondary | ICD-10-CM | POA: Diagnosis not present

## 2020-11-29 LAB — CBC
HCT: 35.8 % — ABNORMAL LOW (ref 39.0–52.0)
Hemoglobin: 12.1 g/dL — ABNORMAL LOW (ref 13.0–17.0)
MCH: 32.1 pg (ref 26.0–34.0)
MCHC: 33.8 g/dL (ref 30.0–36.0)
MCV: 95 fL (ref 80.0–100.0)
Platelets: 128 10*3/uL — ABNORMAL LOW (ref 150–400)
RBC: 3.77 MIL/uL — ABNORMAL LOW (ref 4.22–5.81)
RDW: 13.8 % (ref 11.5–15.5)
WBC: 5 10*3/uL (ref 4.0–10.5)
nRBC: 0 % (ref 0.0–0.2)

## 2020-11-29 LAB — BLOOD GAS, ARTERIAL
Acid-base deficit: 0.4 mmol/L (ref 0.0–2.0)
Bicarbonate: 24.1 mmol/L (ref 20.0–28.0)
FIO2: 0.21
O2 Saturation: 96 %
Patient temperature: 37
pCO2 arterial: 38 mmHg (ref 32.0–48.0)
pH, Arterial: 7.41 (ref 7.350–7.450)
pO2, Arterial: 81 mmHg — ABNORMAL LOW (ref 83.0–108.0)

## 2020-11-29 LAB — BASIC METABOLIC PANEL
Anion gap: 4 — ABNORMAL LOW (ref 5–15)
BUN: 28 mg/dL — ABNORMAL HIGH (ref 8–23)
CO2: 28 mmol/L (ref 22–32)
Calcium: 8.7 mg/dL — ABNORMAL LOW (ref 8.9–10.3)
Chloride: 110 mmol/L (ref 98–111)
Creatinine, Ser: 2.21 mg/dL — ABNORMAL HIGH (ref 0.61–1.24)
GFR, Estimated: 30 mL/min — ABNORMAL LOW (ref >60)
Glucose, Bld: 124 mg/dL — ABNORMAL HIGH (ref 70–99)
Potassium: 4.2 mmol/L (ref 3.5–5.1)
Sodium: 142 mmol/L (ref 135–145)

## 2020-11-29 NOTE — Progress Notes (Addendum)
[facility] [place], [place] [address]  Pulmonary Sleep Medicine   Office Visit Note  Patient Name: [name] [name] DOB: [birth year] MRN [id number]  Date of Service: 11/29/2020  Complaints/HPI: COPD patient is here for follow up of oxygen dependent COPD. Severe disease. He states his breathing has been acting up. He states he is now on 4lpm on his oxygen. He has chronic respiratory failure related to the oxygen dependence and COPD. I do not see a recent ABG done to look for elevation of pCO2. He is using the CPAP therapy and may actually benefit from nocturnal vent support due to chronic respiratory failure as a consequence of severe chronic obstructive pulmonary disease.  Without the ventilator there is increased risk of increased hospitalizations decline in medical health status and carbon dioxide worsening retention.  Patient's prognosis is guarded and is at increased risk of death due to the COPD and nocturnal hypoxia.  ROS  General: (-) fever, (-) chills, (-) night sweats, (-) weakness Skin: (-) rashes, (-) itching,. Eyes: (-) visual changes, (-) redness, (-) itching. Nose and Sinuses: (-) nasal stuffiness or itchiness, (-) postnasal drip, (-) nosebleeds, (-) sinus trouble. Mouth and Throat: (-) sore throat, (-) hoarseness. Neck: (-) swollen glands, (-) enlarged thyroid, (-) neck pain. Respiratory: - cough, (-) bloody sputum, +shortness of breath, - wheezing. Cardiovascular: - ankle swelling, (-) chest pain. Lymphatic: (-) lymph node enlargement. Neurologic: (-) numbness, (-) tingling. Psychiatric: (-) anxiety, (-) depression   Current Medication: Outpatient Encounter Medications as of 11/29/2020  Medication Sig Note   acetaminophen (TYLENOL) 650 MG CR tablet Take 1,300 mg by mouth every 8 (eight) hours as needed for pain.    albuterol (VENTOLIN HFA) 108 (90 Base) MCG/ACT inhaler Inhale 2 puffs into the lungs every 6 (six) hours as needed for  wheezing or shortness of breath.    Ascorbic Acid (VITAMIN C) 1000 MG tablet Take 1,000 mg by mouth at bedtime.    aspirin EC 81 MG EC tablet Take 1 tablet (81 mg total) by mouth daily.    Cholecalciferol (VITAMIN D) 50 MCG (2000 UT) tablet Take 2,000 Units by mouth daily.    citalopram (CELEXA) 40 MG tablet Take 40 mg by mouth every morning.    Evolocumab (REPATHA SURECLICK) 220 MG/ML SOAJ Inject 140 mg into the skin every 14 (fourteen) days. 11/23/2020: Next injection due on 12/01/20   ezetimibe (ZETIA) 10 MG tablet Take 10 mg by mouth at bedtime.    fexofenadine (ALLEGRA) 180 MG tablet Take 180 mg by mouth daily as needed for allergies or rhinitis.    fluticasone (FLONASE) 50 MCG/ACT nasal spray Place 2 sprays into both nostrils daily. (Patient taking differently: Place 2 sprays into both nostrils daily as needed for allergies.)    fluticasone-salmeterol (ADVAIR HFA) 115-21 MCG/ACT inhaler Inhale 2 puffs into the lungs 2 (two) times daily as needed (shortness of breath).    furosemide (LASIX) 20 MG tablet Take 20 mg by mouth every morning.    gabapentin (NEURONTIN) 100 MG capsule Take 2 capsules (200 mg total) by mouth 2 (two) times daily. (Patient taking differently: Take 200 mg by mouth in the morning.)    gabapentin (NEURONTIN) 400 MG capsule Take 400 mg by mouth at bedtime.    methocarbamol (ROBAXIN) 750 MG tablet Take 750 mg by mouth 2 (two) times daily as needed for muscle spasms.    metoprolol succinate (TOPROL-XL) 50 MG 24 hr tablet Take 50 mg by mouth every morning.  Take with or immediately following a meal.    NON FORMULARY cpap w/o2    Omega-3 Fatty Acids (OMEGA-3 FISH OIL PO) Take 1 tablet by mouth daily.    OXYGEN Inhale 3 L into the lungs See admin instructions. Used as needed throughout the day and continuous at bedtime    pantoprazole (PROTONIX) 40 MG tablet Take 40 mg by mouth every morning.    Probiotic Product (PROBIOTIC PO) Take 1 capsule by mouth daily.    Simethicone (GAS-X  PO) Take 1 tablet by mouth 2 (two) times daily as needed (flatulence).    sodium chloride (OCEAN) 0.65 % SOLN nasal spray Place 1 spray into both nostrils as needed for congestion.    SUPER B COMPLEX/C PO Take 1 tablet by mouth daily.    tamsulosin (FLOMAX) 0.4 MG CAPS capsule Take 1 capsule (0.4 mg total) by mouth daily. (Patient taking differently: Take 0.4 mg by mouth daily after breakfast.)    No facility-administered encounter medications on file as of 11/29/2020.    Surgical History: Past Surgical History:  Procedure Laterality Date   COLONOSCOPY     HERNIA REPAIR     umbilical   LITHOTRIPSY     SUPRAVALVULAR AORTIC STENOSIS REPAIR     pig valve-done at [facility]    Medical History: Past Medical History:  Diagnosis Date   Adopted    Arthritis    Basal cell carcinoma 01/04/2010   Right sup. post. helix. 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    Family History: Family History  Adopted: Yes    Social History: Social History   Socioeconomic History   Marital status: Married    Spouse name: Not on file   Number of children: Not on file   Years of education: Not on file   Highest education level: Not on file  Occupational History   Not on file  Tobacco Use   Smoking status: Former    Packs/day: 2.00    Years: 54.00    Pack years: 108.00    Types: Cigarettes    Quit date: 2010    Years since quitting: 12.6   Smokeless tobacco: Former    Types: Chew    Quit date: 05/16/2004  Vaping Use   Vaping Use:  Never used  Substance and Sexual Activity   Alcohol use: No   Drug use: Never   Sexual activity: Not on file  Other Topics Concern   Not on file  Social History Narrative   Not on file   Social Determinants of Health   Financial Resource Strain: Not on file  Food Insecurity: Not on file  Transportation Needs: Not on file  Physical Activity: Not on file  Stress: Not on file  Social Connections: Not on file  Intimate Partner Violence: Not on file    Vital Signs: Blood pressure (!) 164/64, pulse 61, temperature 98.5  F (36.9 C), resp. rate 16, height 5\' 5"  (1.651 m), weight 202 lb 12.8 oz (92 kg), SpO2 95 %.  Examination: General Appearance: The patient is well-developed, well-nourished, and in no distress. Skin: Gross inspection of skin unremarkable. Head: normocephalic, no gross deformities. Eyes: no gross deformities noted. ENT: ears appear grossly normal no exudates. Neck: Supple. No thyromegaly. No LAD. Respiratory: few rhonchi noted at this time. Cardiovascular: Normal S1 and S2 without murmur or rub. Extremities: No cyanosis. pulses are equal. Neurologic: Alert and oriented. No involuntary movements.  LABS: No results found for this or any previous visit (from the past 2160 hour(s)).  Radiology: No results found.  No results found.  No results found.    Assessment and Plan: Patient Active Problem List   Diagnosis Date Noted   Complex tear of medial meniscus of left knee as current injury 10/18/2020   Primary osteoarthritis of left knee 10/18/2020   Hyperkalemia 07/30/2020   DM (diabetes mellitus), type 2 ([place]) 07/30/2020   OSA on CPAP 07/30/2020   Chronic combined systolic (congestive) and diastolic (congestive) heart failure ([place]) 07/30/2020   BPH (benign prostatic hyperplasia) 07/30/2020   GERD (gastroesophageal reflux disease) 07/30/2020   AKI (acute kidney injury) ([place]) 07/27/2020   Chronic respiratory failure with hypoxia ([place]) 07/27/2020   CKD  (chronic kidney disease), stage IV ([place]) 07/27/2020   Essential hypertension 07/27/2020   CAP (community acquired pneumonia) 07/26/2020   Benign hypertensive kidney disease with chronic kidney disease 02/24/2019   Minimal change disease 02/24/2019   Proteinuria 02/24/2019   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   Type 2 diabetes mellitus with diabetic chronic kidney disease ([place]) 02/24/2019   Acute postoperative pain 08/02/2016   On home oxygen therapy 08/02/2016   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   Hypoglycemia 07/26/2016   Idiopathic hypotension 07/26/2016   Acute renal failure (ARF) ([place])    COPD exacerbation (HCC)    Respiratory failure ([place]) 07/05/2016   Complete tear of right rotator cuff 11/15/2015   Rotator cuff tendinitis, right 11/15/2015   ANA positive 07/12/2015   Arthralgia of multiple sites 07/12/2015   Accidental cut, puncture, perforation, or hemorrhage during heart catheterization 12/27/2012   Coronary artery disease 12/27/2012   Hyperlipidemia 12/27/2012   Recurrent nephrolithiasis 12/27/2012   Personal history of other malignant neoplasm of skin 04/08/2012    1. Chronic congestive heart failure, unspecified heart failure type [facility]) Doing well at this time. Follows with cardiology. Reviewed notes  2. Obstructive sleep apnea On PAP therapy may need to advance to trelegy ventilator due to heart failure and COPD along with OSA  3. 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 Will get an ABG on him.  As already noted above patient will benefit from nocturnal ventilatory support with a trilogy ventilator due to severe COPD and chronic respiratory failure which has been progressive.  4. Oxygen dependent On oxygen therapy 4lpm   General Counseling: I have discussed the findings of the evaluation and examination with Thayer Jew.  I have also discussed any  further diagnostic evaluation thatmay be needed or ordered today. Tymeer verbalizes understanding of the findings of todays visit. We also reviewed his medications today and discussed drug interactions and side effects including but not limited excessive drowsiness and altered mental states. We also discussed that there is always a risk not just to him but also people around him. he has been encouraged to call the office with any  questions or concerns that should arise related to todays visit.  No orders of the defined types were placed in this encounter.    Time spent: 15  I have personally obtained a history, examined the patient, evaluated laboratory and imaging results, formulated the assessment and plan and placed orders.    Allyne Gee, MD [facility] Pulmonary and Critical Care Sleep medicine

## 2020-11-29 NOTE — Patient Instructions (Signed)

## 2020-11-30 ENCOUNTER — Encounter: Payer: Self-pay | Admitting: Surgery

## 2020-11-30 ENCOUNTER — Telehealth: Payer: Self-pay

## 2020-11-30 NOTE — Telephone Encounter (Signed)
Received medical clearance from Honor Loh, gave to Dr. Laurelyn Sickle to sign-Toni

## 2020-11-30 NOTE — Progress Notes (Addendum)
Perioperative Services  Pre-Admission/Anesthesia Testing Clinical Review  Date: 11/30/20  Patient Demographics:  Name: [name] [name] DOB:   December 09, 1945 MRN:   [id number]  Planned Surgical Procedure(s):    Case: [id number] Date/Time: 12/02/20 0948   Procedure: KNEE ARTHROSCOPY WITH DEBRIDEMENT AND PARTIAL MEDIAL MENISECTOMY (Left: Knee)   Anesthesia type: Choice   Pre-op diagnosis:      Complex tear of medial meniscus of left knee as current injury, initial encounter S83.232A     Primary osteoarthritis of left knee M17.12   Location: [facility] OR ROOM 03 / [place] [facility]S FOR ANESTHESIA GROUP   Surgeons: Corky Mull, MD     NOTE: Available PAT nursing documentation and vital signs have been reviewed. Clinical nursing staff has updated patient's PMH/PSHx, current medication list, and drug allergies/intolerances to ensure comprehensive history available to assist in medical decision making as it pertains to the aforementioned surgical procedure and anticipated anesthetic course. Extensive review of available clinical information performed. [facility] PMH and PSHx updated with any diagnoses/procedures that  may have been inadvertently omitted during his intake with the pre-admission testing department's nursing staff.  Clinical Discussion:  [name] [name] is a 75 y.o. male who is submitted for pre-surgical anesthesia review and clearance prior to him undergoing the above procedure. Patient is a Former Smoker (108 pack years; quit 04/2008). Pertinent PMH includes: CAD, HFrEF, severe aortic stenosis (s/p TAVR), cardiac murmur, HTN, HLD, T2DM, severe COPD (oxygen dependent), DOE, OSAH (requires nocturnal PAP therapy), CKD-IV, GERD (on daily PPI), hiatal hernia, OA.  Patient is followed by cardiology Humphrey Rolls, MD). Notes from most recent cardiology visit requested, but not received. Available history in Epic reviewed. PMH is significant for cardiovascular diagnoses.   Patient underwent  diagnostic left heart catheterization on 12/26/2012 that revealed 50% stenosis of the mid LAD, 80% stenosis of the mid LCx, and 100% stenosis of the proximal RCA.  LVEF was 50%.  Procedural left ventriculogram revealed a retained pigtail within the myocardium with a small amount of hemopericardium present.  The patient remained hemodynamically stable.  Patient was emergently transferred to [facility] to have retained pigtail removed from the myocardium and to be considered for CABG procedure.    Retained pigtail successfully removed without complications at [facility]  CVTS felt that bypass would not be indicated as patient had insignificant LAD disease and his RCA lesion was deemed to be chronic.  TTE performed on 07/06/2016 revealed moderately reduced left ventricular systolic function with an EF of 40%.  Doppler parameters consistent with abnormal ventricular relaxation.  There was mild aortic valve regurgitation.  Additionally, there was severe aortic valve stenosis noted; mean gradient 24 mmHg.  Right heart catheterization performed on 07/13/2016 revealed elevated right and left-sided filling pressures with marginal cardiac index of 2.3 L/min/m.  Patient underwent TAVR procedure on 08/01/2016; 29 mm CoreValve Evolut was placed.  Limited postoperative echocardiogram revealed moderate left ventricular systolic dysfunction with mild LVH.  There was no regurgitation from the replaced aortic valve; mean gradient 3 mmHg.  Most recent TTE was performed on 09/12/2016 revealing mild left ventricular systolic dysfunction with mild LVH.  There was trivial to mild pan valvular regurgitation noted.  Aortic valve well-seated with proper function; mean gradient 10 mmHg.  Additionally there was mild mitral valve stenosis noted; mean gradient 5 mmHg.  There was no pericardial effusion (see full interpretation of cardiovascular testing and intervention below.  Blood pressure noted to be elevated at recent pulmonology visit;  documented at 164/64 despite prescribed diuretic  and beta-blocker therapies.  Patient is on a PCSK9 (evolocumab) and ezetimibe for his HLD diagnosis.  T2DM well controlled with diet lifestyle modification; last Hgb A1c 5.8% when checked on 07/26/2020.  Patient with an OSAH diagnosis; compliant with prescribed nocturnal PAP therapy.  Additionally, he has a severe COPD diagnosis and is dependent on 3-4 L of supplemental ATC.  Patient jointly managed by pulmonary medicine Humphrey Rolls, MD); last office visit 11/29/2020.  Pulmonologist noted that patient may benefit from nocturnal ventilatory support due to chronic respiratory failure.  ABG was performed in the office; pH 7.41; PCO2 38, PO2 81, HCO3 24.1, and oxygen saturation 96%.  Overall functional capacity limited by patient's underlying cardiopulmonary diagnoses.  Patient to continue routine follow-ups with both cardiology and pulmonary medicine at defined intervals for ongoing management.  Patient is scheduled for an elective knee arthroscopy and partial medial meniscectomy on 12/02/2020 with Dr. Milagros Evener, MD.  Given patient's past medical history significant for cardiopulmonary diagnoses, presurgical clearances were sought from both cardiology and pulmonary medicine.  Specialty clearances were obtained as follows:  Per pulmonary medicine Devona Konig, MD), " this patient is optimized for surgery and may proceed with planned procedural course at an overall HIGH RISK of significant cardiopulmonary complications".  Per cardiology [name], MD), "this patient is optimized for surgery and may proceed with the planned procedural course with a LOW risk of significant perioperative cardiovascular complications".  This patient is on daily antiplatelet therapy.  He has been instructed on recommendations for holding his low-dose ASA for 5 days prior to his procedure with plans to restart as soon as postoperative bleeding risk felt to be minimized by his primary  attending surgeon.  Patient is aware that his last dose of ASA will be on 11/26/2020.  Patient denies previous perioperative complications with anesthesia in the past. In review of the available records, it is noted that patient underwent a regional anesthetic course at [facility] (ASA IV) in 07/2016 without documented complications.   Vitals with BMI 11/29/2020 07/30/2020 07/30/2020  Height 5\' 5"  - -  Weight 202 lbs 13 oz - -  BMI 27.74 - -  Systolic 128 786 767  Diastolic 64 73 61  Pulse 61 81 81    Providers/Specialists:   NOTE: Primary physician provider listed below. Patient may have been seen by APP or partner within same practice.   PROVIDER ROLE / SPECIALTY LAST OV  Poggi, Marshall Cork, MD Orthopedics (Surgeon) 10/18/2020  Perrin Maltese, MD Primary Care Provider ???  [name], MD Cardiology ???  Devona Konig, MD Pulmonary Medicine 11/29/2020  Lavonia Dana, MD Nephrology 09/29/2020   Allergies:  Nexletol [bempedoic acid] and Nsaids  Current Home Medications:   No current facility-administered medications for this encounter.    acetaminophen (TYLENOL) 650 MG CR tablet   albuterol (VENTOLIN HFA) 108 (90 Base) MCG/ACT inhaler   Ascorbic Acid (VITAMIN C) 1000 MG tablet   aspirin EC 81 MG EC tablet   Cholecalciferol (VITAMIN D) 50 MCG (2000 UT) tablet   citalopram (CELEXA) 40 MG tablet   Evolocumab (REPATHA SURECLICK) 209 MG/ML SOAJ   ezetimibe (ZETIA) 10 MG tablet   fexofenadine (ALLEGRA) 180 MG tablet   fluticasone (FLONASE) 50 MCG/ACT nasal spray   fluticasone-salmeterol (ADVAIR HFA) 115-21 MCG/ACT inhaler   furosemide (LASIX) 20 MG tablet   gabapentin (NEURONTIN) 100 MG capsule   gabapentin (NEURONTIN) 400 MG capsule   methocarbamol (ROBAXIN) 750 MG tablet   metoprolol succinate (TOPROL-XL) 50 MG 24 hr  tablet   OXYGEN   pantoprazole (PROTONIX) 40 MG tablet   Probiotic Product (PROBIOTIC PO)   Simethicone (GAS-X PO)   SUPER B COMPLEX/C PO   tamsulosin (FLOMAX) 0.4 MG  CAPS capsule   NON FORMULARY   Omega-3 Fatty Acids (OMEGA-3 FISH OIL PO)   sodium chloride (OCEAN) 0.65 % SOLN nasal spray   History:   Past Medical History:  Diagnosis Date   Aortic stenosis, 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 post. helix. Excised 03/02/2010   CAD (coronary artery disease)    CKD (chronic kidney disease), stage IV (HCC)    COPD (chronic obstructive pulmonary disease) (HCC)    DOE (dyspnea on exertion)    GERD (gastroesophageal reflux disease)    Heart murmur    HFrEF (heart failure with reduced ejection fraction) ([place])    History of hiatal hernia    History of transcatheter aortic valve replacement (TAVR) 08/09/2016   HLD (hyperlipidemia)    Hyperkalemia    Hypertension    Kidney stones    Migraines    OSA on CPAP    Pneumonia 07/2020   Squamous cell carcinoma of skin 05/05/2019   Right malar cheek.   Supplemental oxygen dependent    3-4 L/   T2DM (type 2 diabetes mellitus) ([place])    Past Surgical History:  Procedure Laterality Date   AORTIC VALVE REPLACEMENT N/A 08/01/2016   29 mm CoreValve Evolut; Location: [facility]; Surgeon: Durward Mallard, MD   CARDIAC CATHETERIZATION N/A 12/26/2012   2v CAD; retained pigtail in myocardium --> transferred to [facility]; Location: [place]; Surgeon: Lowanda Foster, MD   COLONOSCOPY     LITHOTRIPSY     PERCUTANEOUS REMOVAL INTRA-AORTIC BALLOON CATH N/A 12/26/2012   Procedure: PERCUTANEOUS REMOVAL INTRA-AORTIC BALLOON CATH; Surgeon: Sande Brothers, MD; Location: [facility] OPERATING ROOMS; Service: Cardiothoracic   RIGHT HEART CATH Right 07/13/2016   Location: [facility]; Surgeon: Pura Spice, MD   TRANSESOPHAGEAL ECHOCARDIOGRAM N/A 12/26/2012   Procedure: TRANSESOPHAGEAL ECHOCARDIOGRAPHY; Surgeon: Sande Brothers, MD; Location: [facility] OPERATING ROOMS; Service: Cardiothoracic   UMBILICAL HERNIA REPAIR N/A 02/13/2014   Procedure: LAPAROSCOPIC UMBILICAL HERNIA REPAIR; Surgeon:  Clydene Pugh, MD; Location: [place]; Service: General Surgery   Family History  Adopted: Yes   Social History   Tobacco Use   Smoking status: Former    Packs/day: 2.00    Years: 54.00    Pack years: 108.00    Types: Cigarettes    Quit date: 2010    Years since quitting: 12.6   Smokeless tobacco: Former    Types: Chew    Quit date: 05/16/2004  Vaping Use   Vaping Use: Never used  Substance Use Topics   Alcohol use: No   Drug use: Never    Pertinent Clinical Results:  LABS: Labs reviewed: Acceptable for surgery.  Hospital Outpatient Visit on 11/29/2020  Component Date Value Ref Range Status   WBC 11/29/2020 5.0  4.0 - 10.5 K/uL Final   RBC 11/29/2020 3.77 (A) 4.22 - 5.81 MIL/uL Final   Hemoglobin 11/29/2020 12.1 (A) 13.0 - 17.0 g/dL Final   HCT 11/29/2020 35.8 (A) 39.0 - 52.0 % Final   MCV 11/29/2020 95.0  80.0 - 100.0 fL Final   MCH 11/29/2020 32.1  26.0 - 34.0 pg Final   MCHC 11/29/2020 33.8  30.0 - 36.0 g/dL Final   RDW 11/29/2020 13.8  11.5 - 15.5 % Final  Platelets 11/29/2020 128 (A) 150 - 400 K/uL Final   nRBC 11/29/2020 0.0  0.0 - 0.2 % Final   Performed at [facility], [place], [place] [address]   Sodium 11/29/2020 142  135 - 145 mmol/L Final   Potassium 11/29/2020 4.2  3.5 - 5.1 mmol/L Final   Chloride 11/29/2020 110  98 - 111 mmol/L Final   CO2 11/29/2020 28  22 - 32 mmol/L Final   Glucose, Bld 11/29/2020 124 (A) 70 - 99 mg/dL Final   Glucose reference range applies only to samples taken after fasting for at least 8 hours.   BUN 11/29/2020 28 (A) 8 - 23 mg/dL Final   Creatinine, Ser 11/29/2020 2.21 (A) 0.61 - 1.24 mg/dL Final   Calcium 11/29/2020 8.7 (A) 8.9 - 10.3 mg/dL Final   GFR, Estimated 11/29/2020 30 (A) >60 mL/min Final   Comment: (NOTE) Calculated using the CKD-EPI Creatinine Equation (2021)    Anion gap 11/29/2020 4 (A) 5 - 15 Final   Performed at [facility], [address] [place],  [place] [address]  Office Visit on 11/29/2020  Component Date Value Ref Range Status   FIO2 11/29/2020 0.21   Final   pH, Arterial 11/29/2020 7.41  7.350 - 7.450 Final   pCO2 arterial 11/29/2020 38  32.0 - 48.0 mmHg Final   pO2, Arterial 11/29/2020 81 (A) 83.0 - 108.0 mmHg Final   Bicarbonate 11/29/2020 24.1  20.0 - 28.0 mmol/L Final   Acid-base deficit 11/29/2020 0.4  0.0 - 2.0 mmol/L Final   O2 Saturation 11/29/2020 96.0  % Final   Patient temperature 11/29/2020 37.0   Final   Collection site 11/29/2020 LEFT RADIAL   Final   Sample type 11/29/2020 ARTERIAL DRAW   Final   Allens test (pass/fail) 11/29/2020 PASS  PASS Final   Performed at [facility], [address] [place], [place] [address]    ECG: Date: 11/29/2020 Time ECG obtained: 1138 AM Rate: 57 bpm Rhythm: sinus bradycardia Axis (leads I and aVF): Normal Intervals: PR 202 ms. QRS 86 ms. QTc 467 ms. ST segment and T wave changes: No evidence of acute ST segment elevation or depression Comparison: Similar to previous tracing obtained on 07/24/2016   IMAGING / PROCEDURES: DIAGNOSTIC RADIOGRAPHS LEFT KNEE performed on 08/31/2020 Good preservation of both the medial and lateral compartment space No acute bony abnormalities Patella is tracking well No osteophytes or subchondral sclerosis  TRANSTHORACIC ECHOCARDIOGRAM performed on 07/28/2020 Left ventricular ejection fraction, by estimation, is 50 to 55%. The left ventricle has low normal function. The left ventricle demonstrates  global hypokinesis. There is mild left ventricular hypertrophy. Left ventricular diastolic parameters are consistent with Grade I diastolic dysfunction (impaired relaxation).  Right ventricular systolic function is normal. The right ventricular size is normal.  Left atrial size was mildly dilated.  Right atrial size was mildly dilated.  The mitral valve was not well visualized. Trivial mitral valve regurgitation. Moderate mitral annular  calcification.  The aortic valve was not well visualized. Aortic valve regurgitation is trivial.   RIGHT HEART CATHETERIZATION AND CORONARY ANGIOGRAPHY performed on 07/13/2016 Elevated right and left-sided filling pressures Mean RA pressure 14 mmHg RVSP 65 mmHg Mean PA pressure 45 mmHg PWP 27 mmHg PVR 3.8 Wood units Marginal cardiac index 2.3 L/min/m  Impression and Plan:  [name] [name] has been referred for pre-anesthesia review and clearance prior to him undergoing the planned anesthetic and procedural courses. Available labs, pertinent testing, and imaging  results were personally reviewed by me. This patient has been appropriately cleared by cardiology (LOW) and pulmonary medicine (HIGH) with the individually indicated risk of significant perioperative complications.   Based on clinical review performed today (11/30/20), barring any significant acute changes in the patient's overall condition, it is anticipated that he will be able to proceed with the planned surgical intervention. It is noted that given patient's poor cardiopulmonary status in the setting of severe COPD, his risk for perioperative complications is increased, and will therefore have to be taken into account by both the patient's surgeon and anesthetic team.  Patient has been advised that any acute changes in clinical condition may necessitate his procedure being postponed and/or cancelled. Patient will meet with anesthesia team (MD and/or CRNA) on the day of his procedure for preoperative evaluation/assessment. Questions regarding anesthetic course will be fielded at that time.   Pre-surgical instructions were reviewed with the patient during his PAT appointment and questions were fielded by PAT clinical staff. Patient was advised that if any questions or concerns arise prior to his procedure then he should return a call to PAT and/or his surgeon's office to discuss.  Honor Loh, MSN, APRN, FNP-C, CEN [facility]  Peri-operative Services Nurse Practitioner Phone: [phone] Fax: [phone] 11/30/20 4:44 PM  NOTE: This note has been prepared using Dragon dictation software. Despite my best ability to proofread, there is always the potential that unintentional transcriptional errors may still occur from this process.

## 2020-11-30 NOTE — Telephone Encounter (Signed)
Surgical clearance signed and faxed back to [phone]

## 2020-12-02 ENCOUNTER — Encounter
Admission: RE | Disposition: A | Discharge status: Home / Self Care | Payer: Self-pay | Source: Home / Self Care | Attending: Surgery

## 2020-12-02 ENCOUNTER — Ambulatory Visit
Admission: RE | Admit: 2020-12-02 | Discharge: 2020-12-02 | Disposition: A | Discharge status: Home / Self Care | Payer: Medicare HMO | Attending: Surgery | Admitting: Surgery

## 2020-12-02 ENCOUNTER — Encounter: Payer: Self-pay | Admitting: Surgery

## 2020-12-02 ENCOUNTER — Ambulatory Visit: Payer: Medicare HMO | Admitting: Urgent Care

## 2020-12-02 ENCOUNTER — Other Ambulatory Visit: Payer: Self-pay

## 2020-12-02 DIAGNOSIS — S83282A Other tear of lateral meniscus, current injury, left knee, initial encounter: Secondary | ICD-10-CM | POA: Insufficient documentation

## 2020-12-02 DIAGNOSIS — Z7951 Long term (current) use of inhaled steroids: Secondary | ICD-10-CM | POA: Insufficient documentation

## 2020-12-02 DIAGNOSIS — M6752 Plica syndrome, left knee: Secondary | ICD-10-CM | POA: Diagnosis not present

## 2020-12-02 DIAGNOSIS — M1712 Unilateral primary osteoarthritis, left knee: Secondary | ICD-10-CM | POA: Diagnosis not present

## 2020-12-02 DIAGNOSIS — S83232A Complex tear of medial meniscus, current injury, left knee, initial encounter: Secondary | ICD-10-CM | POA: Insufficient documentation

## 2020-12-02 DIAGNOSIS — Z7982 Long term (current) use of aspirin: Secondary | ICD-10-CM | POA: Diagnosis not present

## 2020-12-02 DIAGNOSIS — E785 Hyperlipidemia, unspecified: Secondary | ICD-10-CM | POA: Diagnosis not present

## 2020-12-02 DIAGNOSIS — I10 Essential (primary) hypertension: Secondary | ICD-10-CM | POA: Insufficient documentation

## 2020-12-02 DIAGNOSIS — X58XXXA Exposure to other specified factors, initial encounter: Secondary | ICD-10-CM | POA: Insufficient documentation

## 2020-12-02 DIAGNOSIS — Z7984 Long term (current) use of oral hypoglycemic drugs: Secondary | ICD-10-CM | POA: Diagnosis not present

## 2020-12-02 DIAGNOSIS — Z79899 Other long term (current) drug therapy: Secondary | ICD-10-CM | POA: Insufficient documentation

## 2020-12-02 DIAGNOSIS — Z87891 Personal history of nicotine dependence: Secondary | ICD-10-CM | POA: Insufficient documentation

## 2020-12-02 DIAGNOSIS — G4733 Obstructive sleep apnea (adult) (pediatric): Secondary | ICD-10-CM | POA: Insufficient documentation

## 2020-12-02 DIAGNOSIS — J449 Chronic obstructive pulmonary disease, unspecified: Secondary | ICD-10-CM | POA: Diagnosis not present

## 2020-12-02 DIAGNOSIS — I2510 Atherosclerotic heart disease of native coronary artery without angina pectoris: Secondary | ICD-10-CM | POA: Insufficient documentation

## 2020-12-02 DIAGNOSIS — Z8719 Personal history of other diseases of the digestive system: Secondary | ICD-10-CM | POA: Insufficient documentation

## 2020-12-02 DIAGNOSIS — Z9981 Dependence on supplemental oxygen: Secondary | ICD-10-CM | POA: Insufficient documentation

## 2020-12-02 DIAGNOSIS — S83272A Complex tear of lateral meniscus, current injury, left knee, initial encounter: Secondary | ICD-10-CM | POA: Diagnosis not present

## 2020-12-02 DIAGNOSIS — Z886 Allergy status to analgesic agent status: Secondary | ICD-10-CM | POA: Insufficient documentation

## 2020-12-02 HISTORY — DX: Gastro-esophageal reflux disease without esophagitis: K21.9

## 2020-12-02 HISTORY — DX: Hyperlipidemia, unspecified: E78.5

## 2020-12-02 HISTORY — DX: Nonrheumatic aortic (valve) stenosis: I35.0

## 2020-12-02 HISTORY — DX: Unspecified systolic (congestive) heart failure: I50.20

## 2020-12-02 HISTORY — DX: Other forms of dyspnea: R06.09

## 2020-12-02 HISTORY — DX: Dependence on supplemental oxygen: Z99.81

## 2020-12-02 HISTORY — DX: Dyspnea, unspecified: R06.00

## 2020-12-02 HISTORY — DX: Migraine, unspecified, not intractable, without status migrainosus: G43.909

## 2020-12-02 HISTORY — DX: Atherosclerotic heart disease of native coronary artery without angina pectoris: I25.10

## 2020-12-02 HISTORY — DX: Type 2 diabetes mellitus without complications: E11.9

## 2020-12-02 HISTORY — PX: KNEE ARTHROSCOPY WITH MEDIAL MENISECTOMY: SHX5651

## 2020-12-02 HISTORY — DX: Obstructive sleep apnea (adult) (pediatric): G47.33

## 2020-12-02 HISTORY — DX: Chronic kidney disease, stage 4 (severe): N18.4

## 2020-12-02 LAB — GLUCOSE, CAPILLARY
Glucose-Capillary: 159 mg/dL — ABNORMAL HIGH (ref 70–99)
Glucose-Capillary: 93 mg/dL (ref 70–99)

## 2020-12-02 SURGERY — ARTHROSCOPY, KNEE, WITH MEDIAL MENISCECTOMY
Anesthesia: General | Site: Knee | Laterality: Left

## 2020-12-02 MED ORDER — MIDAZOLAM HCL 2 MG/2ML IJ SOLN
INTRAMUSCULAR | Status: AC
  Filled 2020-12-02: qty 2

## 2020-12-02 MED ORDER — SODIUM CHLORIDE 0.9 % IV SOLN: INTRAVENOUS | Status: DC

## 2020-12-02 MED ORDER — CHLORHEXIDINE GLUCONATE 0.12 % MT SOLN: 15.0000 mL | Freq: Once | OROMUCOSAL | Status: AC

## 2020-12-02 MED ORDER — CEFAZOLIN SODIUM-DEXTROSE 2-4 GM/100ML-% IV SOLN
2.0000 g | INTRAVENOUS | Status: AC
  Administered 2020-12-02: 2 g via INTRAVENOUS

## 2020-12-02 MED ORDER — TRAMADOL HCL 50 MG PO TABS: 50.0000 mg | ORAL_TABLET | Freq: Four times a day (QID) | ORAL | Status: DC | PRN

## 2020-12-02 MED ORDER — PHENYLEPHRINE HCL (PRESSORS) 10 MG/ML IV SOLN
INTRAVENOUS | Status: DC | PRN
  Administered 2020-12-02: 50 ug via INTRAVENOUS
  Administered 2020-12-02: 100 ug via INTRAVENOUS
  Administered 2020-12-02: 50 ug via INTRAVENOUS

## 2020-12-02 MED ORDER — LACTATED RINGERS IV SOLN: INTRAVENOUS | Status: DC | PRN

## 2020-12-02 MED ORDER — ACETAMINOPHEN 10 MG/ML IV SOLN: 1000.0000 mg | Freq: Once | INTRAVENOUS | Status: DC | PRN

## 2020-12-02 MED ORDER — CHLORHEXIDINE GLUCONATE 0.12 % MT SOLN
OROMUCOSAL | Status: AC
  Administered 2020-12-02: 15 mL via OROMUCOSAL
  Filled 2020-12-02: qty 15

## 2020-12-02 MED ORDER — LIDOCAINE HCL (CARDIAC) PF 100 MG/5ML IV SOSY
PREFILLED_SYRINGE | INTRAVENOUS | Status: DC | PRN
Start: 2020-12-02 — End: 2020-12-02
  Administered 2020-12-02: 100 mg via INTRAVENOUS

## 2020-12-02 MED ORDER — BUPIVACAINE-EPINEPHRINE (PF) 0.5% -1:200000 IJ SOLN
INTRAMUSCULAR | Status: AC
  Filled 2020-12-02: qty 60

## 2020-12-02 MED ORDER — LIDOCAINE HCL (PF) 1 % IJ SOLN
INTRAMUSCULAR | Status: AC
  Filled 2020-12-02: qty 30

## 2020-12-02 MED ORDER — OXYCODONE HCL 5 MG/5ML PO SOLN: 5.0000 mg | Freq: Once | ORAL | Status: DC | PRN

## 2020-12-02 MED ORDER — METOCLOPRAMIDE HCL 10 MG PO TABS: 5.0000 mg | ORAL_TABLET | Freq: Three times a day (TID) | ORAL | Status: DC | PRN

## 2020-12-02 MED ORDER — ORAL CARE MOUTH RINSE: 15.0000 mL | Freq: Once | OROMUCOSAL | Status: AC

## 2020-12-02 MED ORDER — ONDANSETRON HCL 4 MG/2ML IJ SOLN
INTRAMUSCULAR | Status: DC | PRN
  Administered 2020-12-02: 4 mg via INTRAVENOUS

## 2020-12-02 MED ORDER — PROPOFOL 10 MG/ML IV BOLUS
INTRAVENOUS | Status: AC
  Filled 2020-12-02: qty 20

## 2020-12-02 MED ORDER — FENTANYL CITRATE (PF) 100 MCG/2ML IJ SOLN
INTRAMUSCULAR | Status: AC
  Filled 2020-12-02: qty 2

## 2020-12-02 MED ORDER — ONDANSETRON HCL 4 MG/2ML IJ SOLN: 4.0000 mg | Freq: Four times a day (QID) | INTRAMUSCULAR | Status: DC | PRN

## 2020-12-02 MED ORDER — OXYCODONE HCL 5 MG PO TABS: 5.0000 mg | ORAL_TABLET | Freq: Once | ORAL | Status: DC | PRN

## 2020-12-02 MED ORDER — GLYCOPYRROLATE 0.2 MG/ML IJ SOLN
INTRAMUSCULAR | Status: DC | PRN
  Administered 2020-12-02: .1 mg via INTRAVENOUS
  Administered 2020-12-02: .2 mg via INTRAVENOUS
  Administered 2020-12-02: .1 mg via INTRAVENOUS

## 2020-12-02 MED ORDER — TRAMADOL HCL 50 MG PO TABS: 50.0000 mg | ORAL_TABLET | Freq: Four times a day (QID) | ORAL | 0 refills | Status: AC | PRN

## 2020-12-02 MED ORDER — CEFAZOLIN SODIUM-DEXTROSE 2-4 GM/100ML-% IV SOLN
INTRAVENOUS | Status: AC
  Filled 2020-12-02: qty 100

## 2020-12-02 MED ORDER — PROPOFOL 10 MG/ML IV BOLUS
INTRAVENOUS | Status: DC | PRN
  Administered 2020-12-02: 80 mg via INTRAVENOUS
  Administered 2020-12-02: 40 mg via INTRAVENOUS

## 2020-12-02 MED ORDER — ONDANSETRON HCL 4 MG PO TABS: 4.0000 mg | ORAL_TABLET | Freq: Four times a day (QID) | ORAL | Status: DC | PRN

## 2020-12-02 MED ORDER — METOCLOPRAMIDE HCL 5 MG/ML IJ SOLN: 5.0000 mg | Freq: Three times a day (TID) | INTRAMUSCULAR | Status: DC | PRN

## 2020-12-02 MED ORDER — LIDOCAINE HCL (PF) 1 % IJ SOLN
INTRAMUSCULAR | Status: DC | PRN
  Administered 2020-12-02: 60 mL

## 2020-12-02 MED ORDER — FENTANYL CITRATE (PF) 100 MCG/2ML IJ SOLN
INTRAMUSCULAR | Status: DC | PRN
  Administered 2020-12-02: 25 ug via INTRAVENOUS

## 2020-12-02 MED ORDER — EPHEDRINE SULFATE 50 MG/ML IJ SOLN
INTRAMUSCULAR | Status: DC | PRN
  Administered 2020-12-02 (×2): 7.5 mg via INTRAVENOUS

## 2020-12-02 MED ORDER — FENTANYL CITRATE (PF) 100 MCG/2ML IJ SOLN: 25.0000 ug | INTRAMUSCULAR | Status: DC | PRN

## 2020-12-02 MED ORDER — BUPIVACAINE-EPINEPHRINE (PF) 0.5% -1:200000 IJ SOLN
INTRAMUSCULAR | Status: DC | PRN
  Administered 2020-12-02: 20 mL

## 2020-12-02 SURGICAL SUPPLY — 37 items
APL PRP STRL LF DISP 70% ISPRP (MISCELLANEOUS) ×1
BAG COUNTER SPONGE SURGICOUNT (BAG) IMPLANT
BAG SPNG CNTER NS LX DISP (BAG)
BLADE FULL RADIUS 3.5 (BLADE) ×2 IMPLANT
BLADE SHAVER 4.5X7 STR FR (MISCELLANEOUS) ×2 IMPLANT
BNDG ELASTIC 6X5.8 VLCR STR LF (GAUZE/BANDAGES/DRESSINGS) ×2 IMPLANT
BNDG ESMARK 6X12 TAN STRL LF (GAUZE/BANDAGES/DRESSINGS) ×2 IMPLANT
CHLORAPREP W/TINT 26 (MISCELLANEOUS) ×2 IMPLANT
CUFF TOURN SGL QUICK 24 (TOURNIQUET CUFF) ×2
CUFF TOURN SGL QUICK 34 (TOURNIQUET CUFF)
CUFF TRNQT CYL 24X4X16.5-23 (TOURNIQUET CUFF) ×1 IMPLANT
CUFF TRNQT CYL 34X4.125X (TOURNIQUET CUFF) IMPLANT
DRAPE IMP U-DRAPE 54X76 (DRAPES) ×2 IMPLANT
ELECT REM PT RETURN 9FT ADLT (ELECTROSURGICAL) ×2
ELECTRODE REM PT RTRN 9FT ADLT (ELECTROSURGICAL) ×1 IMPLANT
GAUZE SPONGE 4X4 12PLY STRL (GAUZE/BANDAGES/DRESSINGS) ×2 IMPLANT
GLOVE SURG ENC MOIS LTX SZ8 (GLOVE) ×4 IMPLANT
GLOVE SURG ENC TEXT LTX SZ7 (GLOVE) ×4 IMPLANT
GLOVE SURG UNDER LTX SZ8 (GLOVE) ×2 IMPLANT
GLOVE SURG UNDER POLY LF SZ7.5 (GLOVE) ×2 IMPLANT
GOWN STRL REUS W/ TWL LRG LVL3 (GOWN DISPOSABLE) ×1 IMPLANT
GOWN STRL REUS W/ TWL XL LVL3 (GOWN DISPOSABLE) ×2 IMPLANT
GOWN STRL REUS W/TWL LRG LVL3 (GOWN DISPOSABLE) ×2
GOWN STRL REUS W/TWL XL LVL3 (GOWN DISPOSABLE) ×4
IV LACTATED RINGER IRRG 3000ML (IV SOLUTION) ×2
IV LR IRRIG 3000ML ARTHROMATIC (IV SOLUTION) ×1 IMPLANT
KIT TURNOVER KIT A (KITS) ×2 IMPLANT
MANIFOLD NEPTUNE II (INSTRUMENTS) ×4 IMPLANT
NEEDLE HYPO 21X1.5 SAFETY (NEEDLE) ×2 IMPLANT
PACK ARTHROSCOPY KNEE (MISCELLANEOUS) ×2 IMPLANT
PENCIL ELECTRO HAND CTR (MISCELLANEOUS) ×2 IMPLANT
SPONGE T-LAP 18X18 ~~LOC~~+RFID (SPONGE) ×2 IMPLANT
SUT PROLENE 4 0 PS 2 18 (SUTURE) ×2 IMPLANT
SUT TICRON COATED BLUE 2 0 30 (SUTURE) IMPLANT
SYR 50ML LL SCALE MARK (SYRINGE) ×2 IMPLANT
TUBING INFLOW SET DBFLO PUMP (TUBING) ×2 IMPLANT
WAND WEREWOLF FLOW 90D (MISCELLANEOUS) ×2 IMPLANT

## 2020-12-02 NOTE — Discharge Instructions (Addendum)
    AMBULATORY SURGERY  DISCHARGE INSTRUCTIONS   The drugs that you were given will stay in your system until tomorrow so for the next 24 hours you should not:  Drive an automobile Make any legal decisions Drink any alcoholic beverage   You may resume regular meals tomorrow.  Today it is better to start with liquids and gradually work up to solid foods.  You may eat anything you prefer, but it is better to start with liquids, then soup and crackers, and gradually work up to solid foods.   Please notify your doctor immediately if you have any unusual bleeding, trouble breathing, redness and pain at the surgery site, drainage, fever, or pain not relieved by medication.    Additional Instructions:    Please contact your physician with any problems or Same Day Surgery at [phone], Monday through Friday 6 am to 4 pm, or [place] at [facility] number at [phone]. Orthopedic discharge instructions:  Keep dressing dry and intact.  May shower after dressing changed on post-op day #4 (Monday).  Cover sutures with Band-Aids after drying off. Apply ice frequently to knee. Take pain medication as prescribed or ES Tylenol when needed.  May weight-bear as tolerated - use crutches or walker as needed for balance. Follow-up in 10-14 days or as scheduled.

## 2020-12-02 NOTE — Op Note (Addendum)
12/02/2020  11:38 AM  Patient:   [name] [name]  Pre-Op Diagnosis:   Medial meniscus tear with underlying degenerative joint disease, left knee.  Postoperative diagnosis:   Medial and lateral meniscal tears with symptomatic suprapatellar plica and underlying degenerative joint disease, left knee.  Procedure:   Arthroscopic debridement with debridement of a symptomatic suprapatellar plica and partial medial and lateral meniscectomies, left knee.  Surgeon:   Pascal Lux, MD  Anesthesia:   General LMA  Findings:   As above. There were areas of degenerative tearing involving the posterior portions of both the medial and lateral menisci. The anterior and posterior cruciate ligaments both were in excellent condition. There were grade 1-2 chondromalacial changes involving the femoral trochlea and medial femoral condyle, as well as grade 2 changes involving the central portion of the latyeral tibial plateau. The remaining areas of articular cartilage all were in satisfactory condition.  Complications:   None  EBL:   10 cc.  Total fluids:   400 cc of crystalloid.  Tourniquet time:   None  Drains:   None  Closure:   4-0 Prolene interrupted sutures.  Brief clinical note:   The patient is a 75 year old male with a 4 to 70-month history of progressively worsening medial sided left knee pain. His symptoms have progressed despite medications, activity modification, injections, etc. His history and examination consistent with a medial meniscus tear with underlying degenerative joint disease, all of which were confirmed by MRI scan. The patient presents at this time for arthroscopy, debridement, and partial medial meniscectomy.  Procedure:   The patient was brought into the operating room and lain in the supine position. After adequate general laryngeal mask anesthesia was obtained, a timeout was performed to verify the appropriate side. The patient's left knee was injected sterilely using  a solution of 30 cc of 1% lidocaine and 30 cc of 0.5% Sensorcaine with epinephrine. The left lower extremity was prepped with ChloraPrep solution before being draped sterilely. Preoperative antibiotics were administered. The expected portal sites were injected with 0.5% Sensorcaine with epinephrine before the camera was placed in the anterolateral portal and instrumentation performed through the anteromedial portal.   The knee was sequentially examined beginning in the suprapatellar pouch, then progressing to the patellofemoral space, the medial gutter and compartment, the notch, and finally the lateral compartment and gutter. The findings were as described above. Abundant reactive synovial tissues anteriorly were debrided using the full-radius resector in order to improve visualization.  A large symptomatic suprapatellar plica was debrided back to stable margins using the full-radius resector.  Areas of central degenerative tearing involving the posterior horns of the medial and lateral menisci also were debrided back to stable margins using combination of the mini monitors and full-radius resector.  Subsequent probing of the remaining rim demonstrated excellent stability.  The instruments were removed from the joint after suctioning the excess fluid.   The portal sites were closed using 4-0 Prolene interrupted sutures before a sterile bulky dressing was applied to the knee. The patient was then awakened, extubated, and returned to the recovery room in satisfactory condition after tolerating the procedure well.

## 2020-12-02 NOTE — H&P (Signed)
History of Present Illness:  [name] [name] is a 75 y.o. who presents today for history and physical. He is to undergo a left knee arthroscopy on 12/02/2020. Last seen in the clinic on 10/18/2020. At that time patient was actually improving with his pain and subsequently decided not to have surgery. However shortly thereafter he began having increased pain to the point that it significantly interfering with his activities of daily living. And wished to proceed with surgery.   The symptoms began several months ago and developed without any specific cause or injury. The pain is located along the medial aspect of the knee. The pain is described as aching and dull. The symptoms are aggravated at higher levels of activity and standing pivot. He also describes no mechanical symptoms. He has no associated swelling and no deformity. He has tried acetaminophen, over-the-counter medications, anti-inflammatories, ice and heat with no significant benefit.  Past Medical History:   CAD (coronary artery disease)   COPD (chronic obstructive pulmonary disease) (CMS-HCC)   DM2 (diabetes mellitus, type 2) (CMS-HCC)   History of polycythemia   HLD (hyperlipidemia)   HTN (hypertension)   Nephrolithiasis   OSA (obstructive sleep apnea)   Past Surgical History:  LAPAROSCOPIC REPAIR UMBILICAL HERNIA N/A 16/10/3708  Procedure: LAPAROSCOPIC UMBILICAL HERNIA REPAIR; [name]: Clydene Pugh, MD; Location: [place] [facility]; Service: General Surgery; Laterality: N/A;   LITHOTRIPSY   PERCUTANEOUS REMOVAL INTRA-AORTIC BALLOON CATH Right 12/26/2012  Procedure: PERCUTANEOUS REMOVAL INTRA-AORTIC BALLOON CATH; [name]: Sande Brothers, MD; Location: [facility]; Service: Cardiothoracic; Laterality: Right; Removal of Intramyocardial Catheter   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   acetic acid (VOSOL) 2 % otic solution INSTILL 5 DROPS TO AFFECTED EAR UP TO FOUR TIMES DAILY AS NEEDED FOR ITCHING   amoxicillin (AMOXIL) 500 MG capsule Take 4 capsules 1 hour prior to dental work 8 capsule 2   ascorbic acid, vitamin C, (VITAMIN C) 1000 MG tablet Take 1,000 mg by mouth nightly.   aspirin 81 MG chewable tablet Take 81 mg by mouth nightly.   bempedoic acid-ezetimibe 180-10 mg Tab Take 1 tablet by mouth once daily   cholecalciferol (VITAMIN D3) 1000 unit tablet Take 2,000 Units by mouth once daily.   citalopram (CELEXA) 40 MG tablet 40 mg once daily   diclofenac (VOLTAREN) 1 % topical gel Apply 4 g topically 4 (four) times daily. 300 g 3   evolocumab (REPATHA SYRINGE) 140 mg/mL Syrg INJECT UNDER THE SKIN Q 2 WEEKS UTD   ezetimibe (ZETIA) 10 mg tablet TK 1 T PO QD 2   FEXOFENADINE HCL (ALLEGRA ORAL) Take 10 mg by mouth once daily as needed.   fluticasone (FLONASE) 50 mcg/actuation nasal spray Place 2 sprays into both nostrils as needed.   fluticasone-salmeterol (ADVAIR HFA) 115-21 mcg/actuation inhaler Inhale 2 inhalations into the lungs once daily.   FUROsemide (LASIX) 20 MG tablet 20 mg once daily   gabapentin (NEURONTIN) 400 MG capsule 200 mg every morning, 400 mg nightly   magnesium 250 mg Tab Take 250 mg by mouth once daily   methocarbamol (ROBAXIN) 750 MG tablet Take 750 mg by mouth as needed. 0   metoprolol  succinate (TOPROL-XL) 50 MG XL tablet Take 50 mg by mouth once daily   omega-3 fatty acids/fish oil (OMEGA 3 FISH OIL ORAL) Take 1 tablet by mouth once daily   OXYGEN-AIR DELIVERY SYSTEMS MISC Inhale 3 L into the lungs   pantoprazole (PROTONIX) 40 MG DR tablet Take 40 mg by mouth once daily.   potassium 99 mg Tab  Take 99 mg by mouth once daily   rosuvastatin (CRESTOR) 5 MG tablet Take 5 mg by mouth nightly   simethicone (GAS-X ORAL) Take 1 tablet by mouth 2 (two) times daily as needed   tamsulosin (FLOMAX) 0.4 mg capsule Take 0.4 mg by mouth once daily Take 30 minutes after same meal each day.   triamcinolone 0.5 % cream Apply topically 2 (two) times daily 15 g 4   VENTOLIN HFA 90 mcg/actuation inhaler Inhale 1 inhalation into the lungs as needed.   allopurinoL (ZYLOPRIM) 100 MG tablet Take 50 mg by mouth once daily (Patient not taking: Reported on 12/01/2020)   fenofibrate 160 MG tablet Take 160 mg by mouth once daily.  (Patient not taking: Reported on 12/01/2020)   glimepiride (AMARYL) 2 MG tablet TK 1 T PO BID (Patient not taking: Reported on 12/01/2020) 3   heparin, porcine, 5,000 unit/mL injection Inject subcutaneously (Patient not taking: Reported on 12/01/2020)   hydrALAZINE (APRESOLINE) 25 MG tablet Take 25 mg by mouth 2 (two) times daily (Patient not taking: No sig reported)   losartan (COZAAR) 100 MG tablet Take 100 mg by mouth once daily (Patient not taking: No sig reported)   niacin 500 MG tablet Take 500 mg by mouth nightly. (Patient not taking: Reported on 12/01/2020)   potassium chloride (KLOR-CON) 10 MEQ ER tablet Take 10 mEq by mouth once daily (Patient not taking: Reported on 12/01/2020)   VASCEPA 1 gram Cap once daily (Patient not taking: Reported on 12/01/2020)   Allergies:  Bempedoic Acid Diarrhea and fatigue   Nsaids Other (Avoid due to kidney disease)   Roflumilast Diarrhea   Review of Systems A comprehensive 14 point ROS was performed, reviewed, and the pertinent orthopaedic findings are documented in the HPI.  Physical Exam: BP (!) 140/80 (BP Location: Left upper arm, Patient Position: Sitting, BP Cuff Size: Adult)  Ht 152.4 cm (5')  Wt 92.4 kg (203 lb 9.6 oz)  BMI 39.76 kg/m   General: Well-developed well-nourished male seen in no acute distress.    HEENT: Atraumatic,normocephalic. Pupils are equal and reactive to light. Oropharynx is clear with moist mucosa  Lungs: Clear to auscultation bilaterally   Cardiovascular: Regular rate and rhythm. Normal S1, S2. No murmurs. No appreciable gallops or rubs. Peripheral pulses are palpable.  Abdomen: Soft, non-tender, nondistended. Bowel sounds present  Left knee exam: GAIT: normal and uses no assistive devices. ALIGNMENT: normal SKIN: unremarkable SWELLING: absent EFFUSION: trace WARMTH: no warmth TENDERNESS: Minimal tenderness over the medial joint line ROM: full without pain McMURRAY'S: negative PATELLOFEMORAL: normal tracking with no peri-patellar tenderness and negative apprehension sign CREPITUS: no LACHMAN'S: negative PIVOT SHIFT: negative ANTERIOR DRAWER: negative POSTERIOR DRAWER: negative VARUS/VALGUS: stable  Neurological: The patient is alert and oriented x3 Sensation to light touch appears to be intact and within normal limits Gross motor strength appeared to be equal to 5/5  Vascular : Peripheral pulses felt to be palpable. Capillary refill appears to be intact and within normal limits  X-ray: By report, the MRI of left knee demonstrates evidence of a degenerative tear involving the posterior portion of the medial  meniscus. The study also demonstrates mild degenerative changes of the medial and patellofemoral compartments. The lateral compartment is well-preserved. There is no lateral meniscus or ligamentous pathology. Both the films and report were reviewed by myself and discussed with the patient and his wife.  Impression: 1. Complex tear medial meniscus left knee 2. Degenerative arthrosis left knee  Plan:  The treatment options, including both surgical and nonsurgical choices, have been discussed in detail with the patient. The risks (including bleeding, infection, nerve and/or blood vessel injury, persistent or recurrent pain, loosening or failure of  the components, leg length inequality, dislocation, need for further surgery, blood clots, strokes, heart attacks or arrhythmias, pneumonia, etc.) and benefits of the surgical procedure were discussed. The patient states his/her understanding and agrees to proceed. A formal written consent will be obtained by the nursing staff.   H&P reviewed and patient re-examined. No changes.

## 2020-12-02 NOTE — Anesthesia Postprocedure Evaluation (Signed)
Anesthesia Post Note  Patient: [name] [name]  Procedure(s) Performed: KNEE ARTHROSCOPY WITH DEBRIDEMENT AND PARTIAL MEDIAL AND LATERAL MENISECTOMY (Left: Knee)  Patient location during evaluation: PACU Anesthesia Type: General Level of consciousness: awake and alert Pain management: pain level controlled Vital Signs Assessment: post-procedure vital signs reviewed and stable Respiratory status: spontaneous breathing, nonlabored ventilation and respiratory function stable Cardiovascular status: blood pressure returned to baseline and stable Postop Assessment: no apparent nausea or vomiting Anesthetic complications: no   No notable events documented.   Last Vitals:  Vitals:   12/02/20 1227 12/02/20 1251  BP: (!) 155/70 (!) 158/74  Pulse: 78 70  Resp: 20 20  Temp: (!) 36.3 C   SpO2: 98% 98%    Last Pain:  Vitals:   12/02/20 1251  TempSrc:   PainSc: 0-No pain                 [place] Ouch

## 2020-12-02 NOTE — Transfer of Care (Signed)
Immediate Anesthesia Transfer of Care Note  Patient: [name] [name]  Procedure(s) Performed: KNEE ARTHROSCOPY WITH DEBRIDEMENT AND PARTIAL MEDIAL AND LATERAL MENISECTOMY (Left: Knee)  Patient Location: PACU  Anesthesia Type:General  Level of Consciousness: drowsy and patient cooperative  Airway & Oxygen Therapy: Patient Spontanous Breathing and Patient connected to face mask oxygen  Post-op Assessment: Report given to RN and Post -op Vital signs reviewed and stable  Post vital signs: Reviewed and stable  Last Vitals:  Vitals Value Taken Time  BP 162/74 12/02/20 1141  Temp    Pulse 82 12/02/20 1144  Resp 14 12/02/20 1144  SpO2 97 % 12/02/20 1144  Vitals shown include unvalidated device data.  Last Pain:  Vitals:   12/02/20 0812  TempSrc: Temporal  PainSc: 9          Complications: No notable events documented.

## 2020-12-02 NOTE — Op Note (Deleted)
Orthopedic discharge instructions: Keep dressing dry and intact.  May shower after dressing changed on post-op day #4 (Monday).  Cover sutures with Band-Aids after drying off. Apply ice frequently to knee. Take pain medication as prescribed or ES Tylenol when needed.  May weight-bear as tolerated - use crutches or walker as needed for balance. Follow-up in 10-14 days or as scheduled.

## 2020-12-02 NOTE — Anesthesia Procedure Notes (Signed)
Procedure Name: LMA Insertion Date/Time: 12/02/2020 10:49 AM Performed by: Posey Pronto, Tayveon Lombardo, CRNA Pre-anesthesia Checklist: Patient identified, Patient being monitored, Timeout performed, Emergency Drugs available and Suction available Patient Re-evaluated:Patient Re-evaluated prior to induction Oxygen Delivery Method: Circle system utilized Preoxygenation: Pre-oxygenation with 100% oxygen Induction Type: IV induction Ventilation: Mask ventilation without difficulty LMA: LMA inserted LMA Size: 4.5 Tube type: Oral Number of attempts: 1 Placement Confirmation: positive ETCO2 and breath sounds checked- equal and bilateral Tube secured with: Tape Dental Injury: Teeth and Oropharynx as per pre-operative assessment  Comments: Eyes taped prior to LMA placement

## 2020-12-02 NOTE — Anesthesia Preprocedure Evaluation (Addendum)
Anesthesia Evaluation  Patient identified by MRN, date of birth, ID band Patient awake    Reviewed: Allergy & Precautions, NPO status , Patient's Chart, lab work & pertinent test results, reviewed documented beta blocker date and time   Airway Mallampati: III  TM Distance: >3 FB Neck ROM: Full    Dental  (+)    Pulmonary shortness of breath, with exertion and Long-Term Oxygen Therapy, sleep apnea and Continuous Positive Airway Pressure Ventilation , COPD,  COPD inhaler, former smoker,    Pulmonary exam normal        Cardiovascular Exercise Tolerance: Poor hypertension, Pt. on home beta blockers + CAD, +CHF (HFrEF (heart failure with reduced ejection fraction)) and + DOE  Normal cardiovascular exam Rhythm:Regular Rate:Normal  History of transcatheter aortic valve replacement (TAVR) 2018  07/28/2020: ECHO: 1. Left ventricular ejection fraction, by estimation, is 50 to 55%. The  left ventricle has low normal function. The left ventricle demonstrates  global hypokinesis. There is mild left ventricular hypertrophy. Left  ventricular diastolic parameters are  consistent with Grade I diastolic dysfunction (impaired relaxation).  2. Right ventricular systolic function is normal. The right ventricular  size is normal.  3. Left atrial size was mildly dilated.  4. Right atrial size was mildly dilated.  5. The mitral valve was not well visualized. Trivial mitral valve  regurgitation. Moderate mitral annular calcification.  6. The aortic valve was not well visualized. Aortic valve regurgitation  is trivial.    Neuro/Psych negative neurological ROS  negative psych ROS   GI/Hepatic Neg liver ROS, hiatal hernia, GERD  Controlled and Medicated,  Endo/Other  diabetes, Well Controlled, Type 2  Renal/GU CRFRenal disease     Musculoskeletal  (+) Arthritis , Complex tear of medial meniscus of left knee as current injury    Abdominal (+) + obese,   Peds  Hematology   Anesthesia Other Findings   Reproductive/Obstetrics                            Anesthesia Physical Anesthesia Plan  ASA: 3  Anesthesia Plan: General   Post-op Pain Management:    Induction: Intravenous  PONV Risk Score and Plan: 2 and Ondansetron and Dexamethasone  Airway Management Planned: LMA  Additional Equipment:   Intra-op Plan:   Post-operative Plan: Extubation in OR  Informed Consent: I have reviewed the patients History and Physical, chart, labs and discussed the procedure including the risks, benefits and alternatives for the proposed anesthesia with the patient or authorized representative who has indicated his/her understanding and acceptance.     Dental advisory given  Plan Discussed with: Anesthesiologist and CRNA  Anesthesia Plan Comments:         Anesthesia Quick Evaluation

## 2020-12-06 ENCOUNTER — Telehealth: Payer: Self-pay

## 2020-12-06 DIAGNOSIS — S83272A Complex tear of lateral meniscus, current injury, left knee, initial encounter: Secondary | ICD-10-CM

## 2020-12-06 HISTORY — DX: Complex tear of lateral meniscus, current injury, left knee, initial encounter: S83.272A

## 2020-12-06 NOTE — Telephone Encounter (Signed)
Advised pt of bx results/sh ?

## 2020-12-06 NOTE — Telephone Encounter (Signed)
-----   Message from Brendolyn Patty, MD sent at 12/06/2020  8:41 AM EDT ----- Skin , right neck below ear BASAL CELL CARCINOMA, NODULAR AND INFILTRATIVE PATTERNS, BASE INVOLVED  BCC skin cancer- already treated with EDC at time of biopsy, will observe for recurrence   - please call patient

## 2020-12-10 DIAGNOSIS — E663 Overweight: Secondary | ICD-10-CM | POA: Diagnosis not present

## 2020-12-10 DIAGNOSIS — I10 Essential (primary) hypertension: Secondary | ICD-10-CM | POA: Diagnosis not present

## 2020-12-10 DIAGNOSIS — I2510 Atherosclerotic heart disease of native coronary artery without angina pectoris: Secondary | ICD-10-CM | POA: Diagnosis not present

## 2020-12-10 DIAGNOSIS — G4739 Other sleep apnea: Secondary | ICD-10-CM | POA: Diagnosis not present

## 2020-12-10 DIAGNOSIS — E1169 Type 2 diabetes mellitus with other specified complication: Secondary | ICD-10-CM | POA: Diagnosis not present

## 2020-12-10 DIAGNOSIS — J449 Chronic obstructive pulmonary disease, unspecified: Secondary | ICD-10-CM | POA: Diagnosis not present

## 2020-12-15 ENCOUNTER — Other Ambulatory Visit: Payer: Self-pay

## 2020-12-15 ENCOUNTER — Ambulatory Visit: Payer: Medicare HMO | Admitting: Internal Medicine

## 2020-12-15 DIAGNOSIS — R0602 Shortness of breath: Secondary | ICD-10-CM

## 2020-12-15 DIAGNOSIS — J449 Chronic obstructive pulmonary disease, unspecified: Secondary | ICD-10-CM

## 2020-12-22 NOTE — Procedures (Signed)
[facility] [place], [id number]    Complete Pulmonary Function Testing Interpretation:  FINDINGS:  The forced vital capacity is mildly decreased FEV1 is 1.72 L which is 65% of predicted and is mildly decreased.  The FEV1 FVC ratio is mildly decreased.  Postbronchodilator no significant change in the FEV1 clinical improvement may still occur in the absence of spirometric improvement.  Total lung capacity is mildly decreased.  Residual volume is mildly decreased.  Residual volume total lung capacity ratio is increased.  FRC is decreased.  DLCO was severely decreased.  IMPRESSION:  This pulmonary function study is consistent with mild restrictive lung disease mild obstructive lung disease seen patient  [name] Gee, MD [facility] Pulmonary Critical Care Medicine Sleep Medicine

## 2020-12-23 DIAGNOSIS — H353131 Nonexudative age-related macular degeneration, bilateral, early dry stage: Secondary | ICD-10-CM | POA: Diagnosis not present

## 2020-12-23 DIAGNOSIS — Z0100 Encounter for examination of eyes and vision without abnormal findings: Secondary | ICD-10-CM | POA: Diagnosis not present

## 2020-12-29 ENCOUNTER — Ambulatory Visit: Payer: Medicare HMO

## 2020-12-29 DIAGNOSIS — J449 Chronic obstructive pulmonary disease, unspecified: Secondary | ICD-10-CM | POA: Diagnosis not present

## 2020-12-29 DIAGNOSIS — G4733 Obstructive sleep apnea (adult) (pediatric): Secondary | ICD-10-CM | POA: Diagnosis not present

## 2020-12-30 ENCOUNTER — Ambulatory Visit: Payer: Medicare HMO | Admitting: Internal Medicine

## 2021-01-03 ENCOUNTER — Encounter: Payer: Self-pay | Admitting: Nurse Practitioner

## 2021-01-03 NOTE — Progress Notes (Signed)
Completed peer-to-peer review for request for noninvasive home vent. Medicare criteria is for arterial blood gas to show pO2 of less than 60 or pCO2 45 or greater. He did not meet criteria with most recent arterial blood gas. Patient's daughter was notified.

## 2021-01-07 DIAGNOSIS — E119 Type 2 diabetes mellitus without complications: Secondary | ICD-10-CM | POA: Diagnosis not present

## 2021-01-11 DIAGNOSIS — N050 Unspecified nephritic syndrome with minor glomerular abnormality: Secondary | ICD-10-CM | POA: Diagnosis not present

## 2021-01-11 DIAGNOSIS — I129 Hypertensive chronic kidney disease with stage 1 through stage 4 chronic kidney disease, or unspecified chronic kidney disease: Secondary | ICD-10-CM | POA: Diagnosis not present

## 2021-01-11 DIAGNOSIS — N2581 Secondary hyperparathyroidism of renal origin: Secondary | ICD-10-CM | POA: Diagnosis not present

## 2021-01-11 DIAGNOSIS — R809 Proteinuria, unspecified: Secondary | ICD-10-CM | POA: Diagnosis not present

## 2021-01-11 DIAGNOSIS — E1122 Type 2 diabetes mellitus with diabetic chronic kidney disease: Secondary | ICD-10-CM | POA: Diagnosis not present

## 2021-01-11 DIAGNOSIS — N184 Chronic kidney disease, stage 4 (severe): Secondary | ICD-10-CM | POA: Diagnosis not present

## 2021-01-24 ENCOUNTER — Other Ambulatory Visit: Payer: Self-pay

## 2021-01-24 ENCOUNTER — Encounter: Payer: Self-pay | Admitting: Internal Medicine

## 2021-01-24 ENCOUNTER — Ambulatory Visit: Payer: Medicare HMO | Admitting: Internal Medicine

## 2021-01-24 VITALS — BP 170/70 | HR 64 | Temp 98.2°F | Resp 16 | Ht 63.0 in | Wt 203.0 lb

## 2021-01-24 DIAGNOSIS — Z9981 Dependence on supplemental oxygen: Secondary | ICD-10-CM | POA: Diagnosis not present

## 2021-01-24 DIAGNOSIS — G4733 Obstructive sleep apnea (adult) (pediatric): Secondary | ICD-10-CM | POA: Diagnosis not present

## 2021-01-24 DIAGNOSIS — I509 Heart failure, unspecified: Secondary | ICD-10-CM

## 2021-01-24 DIAGNOSIS — J449 Chronic obstructive pulmonary disease, unspecified: Secondary | ICD-10-CM

## 2021-01-24 NOTE — Progress Notes (Signed)
[facility] [place], [place] [address]  Pulmonary Sleep Medicine   Office Visit Note  Patient Name: [name] [name] DOB: October 05, 1945 MRN [id number]  Date of Service: 01/24/2021  Complaints/HPI: OSA on CPAP COPD oxygen dependent. CKD being seen by nephrology Last PFT done shows FEV1 of 65% Patient had an ABG done did not show hypercapnea so therefore he would not qualify for noninvasive ventilation.  Patient does have cardiac history so we will need to continue to monitor along closely.  Doing fine with the CPAP however.  ROS  General: (-) fever, (-) chills, (-) night sweats, (-) weakness Skin: (-) rashes, (-) itching,. Eyes: (-) visual changes, (-) redness, (-) itching. Nose and Sinuses: (-) nasal stuffiness or itchiness, (-) postnasal drip, (-) nosebleeds, (-) sinus trouble. Mouth and Throat: (-) sore throat, (-) hoarseness. Neck: (-) swollen glands, (-) enlarged thyroid, (-) neck pain. Respiratory: - cough, (-) bloody sputum, + shortness of breath, - wheezing. Cardiovascular: - ankle swelling, (-) chest pain. Lymphatic: (-) lymph node enlargement. Neurologic: (-) numbness, (-) tingling. Psychiatric: (-) anxiety, (-) depression   Current Medication: Outpatient Encounter Medications as of 01/24/2021  Medication Sig Note   acetaminophen (TYLENOL) 650 MG CR tablet Take 1,300 mg by mouth every 8 (eight) hours as needed for pain.    albuterol (VENTOLIN HFA) 108 (90 Base) MCG/ACT inhaler Inhale 2 puffs into the lungs every 6 (six) hours as needed for wheezing or shortness of breath.    Ascorbic Acid (VITAMIN C) 1000 MG tablet Take 1,000 mg by mouth at bedtime.    aspirin EC 81 MG EC tablet Take 1 tablet (81 mg total) by mouth daily.    Cholecalciferol (VITAMIN D) 50 MCG (2000 UT) tablet Take 2,000 Units by mouth daily.    citalopram (CELEXA) 40 MG tablet Take 40 mg by mouth every morning.    Evolocumab (REPATHA SURECLICK) 858 MG/ML SOAJ Inject 140 mg  into the skin every 14 (fourteen) days. 11/23/2020: Next injection due on 12/01/20   ezetimibe (ZETIA) 10 MG tablet Take 10 mg by mouth at bedtime.    fexofenadine (ALLEGRA) 180 MG tablet Take 180 mg by mouth daily as needed for allergies or rhinitis.    fluticasone (FLONASE) 50 MCG/ACT nasal spray Place 2 sprays into both nostrils daily. (Patient taking differently: Place 2 sprays into both nostrils daily as needed for allergies.)    fluticasone-salmeterol (ADVAIR HFA) 115-21 MCG/ACT inhaler Inhale 2 puffs into the lungs 2 (two) times daily as needed (shortness of breath).    furosemide (LASIX) 20 MG tablet Take 20 mg by mouth every morning.    gabapentin (NEURONTIN) 100 MG capsule Take 2 capsules (200 mg total) by mouth 2 (two) times daily. (Patient taking differently: Take 200 mg by mouth in the morning.)    gabapentin (NEURONTIN) 400 MG capsule Take 400 mg by mouth at bedtime.    methocarbamol (ROBAXIN) 750 MG tablet Take 750 mg by mouth 2 (two) times daily as needed for muscle spasms.    metoprolol succinate (TOPROL-XL) 50 MG 24 hr tablet Take 50 mg by mouth every morning. Take with or immediately following a meal.    NON FORMULARY cpap w/o2    Omega-3 Fatty Acids (OMEGA-3 FISH OIL PO) Take 1 tablet by mouth daily.    OXYGEN Inhale 3 L into the lungs See admin instructions. Used as needed throughout the day and continuous at bedtime    pantoprazole (PROTONIX) 40 MG tablet Take 40 mg by  mouth every morning.    Probiotic Product (PROBIOTIC PO) Take 1 capsule by mouth daily.    Simethicone (GAS-X PO) Take 1 tablet by mouth 2 (two) times daily as needed (flatulence).    sodium chloride (OCEAN) 0.65 % SOLN nasal spray Place 1 spray into both nostrils as needed for congestion.    SUPER B COMPLEX/C PO Take 1 tablet by mouth daily.    tamsulosin (FLOMAX) 0.4 MG CAPS capsule Take 1 capsule (0.4 mg total) by mouth daily.    traMADol (ULTRAM) 50 MG tablet Take 1 tablet (50 mg total) by mouth every 6 (six)  hours as needed for moderate pain.    No facility-administered encounter medications on file as of 01/24/2021.    Surgical History: Past Surgical History:  Procedure Laterality Date   AORTIC VALVE REPLACEMENT N/A 08/01/2016   29 mm CoreValve Evolut; Location: [facility]; Surgeon: Durward Mallard, MD   CARDIAC CATHETERIZATION N/A 12/26/2012   2v CAD; retained pigtail in myocardium --> transferred to [facility]; Location: [place]; Surgeon: Lowanda Foster, MD   COLONOSCOPY     KNEE ARTHROSCOPY WITH MEDIAL MENISECTOMY Left 12/02/2020   Procedure: KNEE ARTHROSCOPY WITH DEBRIDEMENT AND PARTIAL MEDIAL AND LATERAL MENISECTOMY;  Surgeon: Corky Mull, MD;  Location: [facility];  Service: Orthopedics;  Laterality: Left;   LITHOTRIPSY     PERCUTANEOUS REMOVAL INTRA-AORTIC BALLOON CATH N/A 12/26/2012   Procedure: PERCUTANEOUS REMOVAL INTRA-AORTIC BALLOON CATH; Surgeon: Sande Brothers, MD; Location: [facility]; Service: Cardiothoracic   RIGHT HEART CATH Right 07/13/2016   Location: [facility]; Surgeon: Pura Spice, MD   TRANSESOPHAGEAL ECHOCARDIOGRAM N/A 12/26/2012   Procedure: TRANSESOPHAGEAL ECHOCARDIOGRAPHY; Surgeon: Sande Brothers, MD; Location: [facility]; Service: Cardiothoracic   UMBILICAL HERNIA REPAIR N/A 02/13/2014   Procedure: LAPAROSCOPIC UMBILICAL HERNIA REPAIR; Surgeon: Clydene Pugh, MD; Location: [place]; Service: General Surgery    Medical History: Past Medical History:  Diagnosis Date   Aortic stenosis, 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 post. helix. 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   Supplemental oxygen dependent    3-4 L/[place]   T2DM (type 2 diabetes mellitus) ([place])     Family History: Family History  Adopted: Yes  Problem Relation Age of Onset   Varicose Veins Daughter    Obesity Daughter    Hyperlipidemia Daughter    Diabetes Daughter    COPD Daughter    Depression Daughter    Asthma Daughter    Arthritis Son    Diabetes Son    Hypertension Son     Social History: Social History   Socioeconomic History   Marital status: Married    Spouse  name: Not on file   Number of children: Not on file   Years of education: Not on file   Highest education level: Not on file  Occupational History   Not on file  Tobacco Use   Smoking status: Former    Packs/day: 2.00    Years: 54.00    Pack years: 108.00    Types: Cigarettes    Quit date: 2010    Years since quitting: 12.7   Smokeless tobacco: Former    Types: Chew    Quit date: 05/16/2004  Vaping Use   Vaping Use: Never used  Substance and Sexual Activity   Alcohol use: No   Drug use: Never   Sexual activity: Not on file  Other Topics Concern   Not on file  Social History Narrative   Not on file   Social Determinants of Health   Financial Resource Strain: Not on file  Food Insecurity: Not on file  Transportation Needs: Not on file  Physical Activity: Not on file  Stress: Not on file  Social Connections: Not on file  Intimate Partner Violence: Not on file    Vital Signs: Blood pressure (!) 170/70, pulse 64, temperature 98.2 F (36.8 C), resp. rate 16, height 5\' 3"  (1.6 m), weight 203 lb (92.1 kg), SpO2 96 %.  Examination: General Appearance: The patient is well-developed,  well-nourished, and in no distress. Skin: Gross inspection of skin unremarkable. Head: normocephalic, no gross deformities. Eyes: no gross deformities noted. ENT: ears appear grossly normal no exudates. Neck: Supple. No thyromegaly. No LAD. Respiratory: no rhonchi noted at this time. Cardiovascular: Normal S1 and S2 without murmur or rub. Extremities: No cyanosis. pulses are equal. Neurologic: Alert and oriented. No involuntary movements.  LABS: Recent Results (from the past 2160 hour(s))  Blood Gas, Arterial     Status: Abnormal   Collection Time: 11/29/20 10:58 AM  Result Value Ref Range   FIO2 0.21    pH, Arterial 7.41 7.350 - 7.450   pCO2 arterial 38 32.0 - 48.0 mmHg   pO2, Arterial 81 (L) 83.0 - 108.0 mmHg   Bicarbonate 24.1 20.0 - 28.0 mmol/L   Acid-base deficit 0.4 0.0 - 2.0 mmol/L   O2 Saturation 96.0 %   Patient temperature 37.0    Collection site LEFT RADIAL    Sample type ARTERIAL DRAW    Allens test (pass/fail) PASS PASS    Comment: Performed at [facility], [address] [place], [place] [address]  CBC     Status: Abnormal   Collection Time: 11/29/20 11:42 AM  Result Value Ref Range   WBC 5.0 4.0 - 10.5 K/uL   RBC 3.77 (L) 4.22 - 5.81 MIL/uL   Hemoglobin 12.1 (L) 13.0 - 17.0 g/dL   HCT 35.8 (L) 39.0 - 52.0 %   MCV 95.0 80.0 - 100.0 fL   MCH 32.1 26.0 - 34.0 pg   MCHC 33.8 30.0 - 36.0 g/dL   RDW 13.8 11.5 - 15.5 %   Platelets 128 (L) 150 - 400 K/uL   nRBC 0.0 0.0 - 0.2 %    Comment: Performed at [facility], [address], [place], [place] [address]  Basic metabolic panel     Status: Abnormal   Collection Time: 11/29/20 11:42 AM  Result Value Ref Range   Sodium 142 135 - 145 mmol/L   Potassium 4.2 3.5 - 5.1 mmol/L   Chloride 110 98 - 111 mmol/L   CO2 28 22 - 32 mmol/L  Glucose, Bld 124 (H) 70 - 99 mg/dL    Comment: Glucose reference range applies only to samples taken after fasting for at least 8 hours.   BUN 28 (H) 8 - 23 mg/dL    Creatinine, Ser 2.21 (H) 0.61 - 1.24 mg/dL   Calcium 8.7 (L) 8.9 - 10.3 mg/dL   GFR, Estimated 30 (L) >60 mL/min    Comment: (NOTE) Calculated using the CKD-EPI Creatinine Equation (2021)    Anion gap 4 (L) 5 - 15    Comment: Performed at [facility], [address] [place], [place] [address]  Glucose, capillary     Status: Abnormal   Collection Time: 12/02/20  8:01 AM  Result Value Ref Range   Glucose-Capillary 159 (H) 70 - 99 mg/dL    Comment: Glucose reference range applies only to samples taken after fasting for at least 8 hours.  Glucose, capillary     Status: None   Collection Time: 12/02/20 11:43 AM  Result Value Ref Range   Glucose-Capillary 93 70 - 99 mg/dL    Comment: Glucose reference range applies only to samples taken after fasting for at least 8 hours.    Radiology: Pulmonary Function Test [facility] Only  Result Date: 11/30/2020 No pfts scanned   No results found.  No results found.    Assessment and Plan: Patient Active Problem List   Diagnosis Date Noted   Complex tear of medial meniscus of left knee as current injury 10/18/2020   Primary osteoarthritis of left knee 10/18/2020   Hyperkalemia 07/30/2020   DM (diabetes mellitus), type 2 ([place]) 07/30/2020   OSA on CPAP 07/30/2020   Chronic combined systolic (congestive) and diastolic (congestive) heart failure ([place]) 07/30/2020   BPH (benign prostatic hyperplasia) 07/30/2020   GERD (gastroesophageal reflux disease) 07/30/2020   AKI (acute kidney injury) ([place]) 07/27/2020   Chronic respiratory failure with hypoxia ([place]) 07/27/2020   CKD (chronic kidney disease), stage IV ([place]) 07/27/2020   Essential hypertension 07/27/2020   CAP (community acquired pneumonia) 07/26/2020   Benign hypertensive kidney disease with chronic kidney disease 02/24/2019   Minimal change disease 02/24/2019   Proteinuria 02/24/2019   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   Type 2 diabetes mellitus  with diabetic chronic kidney disease ([place]) 02/24/2019   Acute postoperative pain 08/02/2016   On home oxygen therapy 08/02/2016   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   Hypoglycemia 07/26/2016   Idiopathic hypotension 07/26/2016   Acute renal failure (ARF) ([place])    COPD exacerbation (HCC)    Respiratory failure ([place]) 07/05/2016   Complete tear of right rotator cuff 11/15/2015   Rotator cuff tendinitis, right 11/15/2015   ANA positive 07/12/2015   Arthralgia of multiple sites 07/12/2015   Accidental cut, puncture, perforation, or hemorrhage during heart catheterization 12/27/2012   Coronary artery disease 12/27/2012   Hyperlipidemia 12/27/2012   Recurrent nephrolithiasis 12/27/2012   Personal history of other malignant neoplasm of skin 04/08/2012   1. Obstructive chronic bronchitis without exacerbation ([place]) Severe disease we will continue with inhalers as prescribed.  Patient does seem to be under control at this time.  2. Obstructive sleep apnea On PAP therapy will continue with current recommended regimen.  Not qualifying right now for noninvasive ventilation  3. Chronic congestive heart failure, unspecified heart failure type ([place]) Compensated monitor fluid status closely.  4. Oxygen dependent Continue with oxygen therapy at current recommended flow rate   General Counseling: I have discussed the findings of the evaluation  and examination with Thayer Jew.  I have also discussed any further diagnostic evaluation thatmay be needed or ordered today. Viyan verbalizes understanding of the findings of todays visit. We also reviewed his medications today and discussed drug interactions and side effects including but not limited excessive drowsiness and altered mental states. We also discussed that there is always a risk not just to him but also people around him. he has been encouraged to call the office with any questions or concerns that should arise related to  todays visit.  No orders of the defined types were placed in this encounter.    Time spent: 45  I have personally obtained a history, examined the patient, evaluated laboratory and imaging results, formulated the assessment and plan and placed orders.    Allyne Gee, MD [facility] Pulmonary and Critical Care Sleep medicine

## 2021-01-24 NOTE — Patient Instructions (Signed)
Sleep Apnea Sleep apnea affects breathing during sleep. It causes breathing to stop for 10 seconds or more, or to become shallow. People with sleep apnea usually snore loudly. It can also increase the risk of: Heart attack. Stroke. Being very overweight (obese). Diabetes. Heart failure. Irregular heartbeat. High blood pressure. The goal of treatment is to help you breathe normally again. What are the causes? The most common cause of this condition is a collapsed or blocked airway. There are three kinds of sleep apnea: Obstructive sleep apnea. This is caused by a blocked or collapsed airway. Central sleep apnea. This happens when the brain does not send the right signals to the muscles that control breathing. Mixed sleep apnea. This is a combination of obstructive and central sleep apnea. What increases the risk? Being overweight. Smoking. Having a small airway. Being older. Being male. Drinking alcohol. Taking medicines to calm yourself (sedatives or tranquilizers). Having family members with the condition. Having a tongue or tonsils that are larger than normal. What are the signs or symptoms? Trouble staying asleep. Loud snoring. Headaches in the morning. Waking up gasping. Dry mouth or sore throat in the morning. Being sleepy or tired during the day. If you are sleepy or tired during the day, you may also: Not be able to focus your mind (concentrate). Forget things. Get angry a lot and have mood swings. Feel sad (depressed). Have changes in your personality. Have less interest in sex, if you are male. Be unable to have an erection, if you are male. How is this treated?  Sleeping on your side. Using a medicine to get rid of mucus in your nose (decongestant). Avoiding the use of alcohol, medicines to help you relax, or certain pain medicines (narcotics). Losing weight, if needed. Changing your diet. Quitting smoking. Using a machine to open your airway while you  sleep, such as: An oral appliance. This is a mouthpiece that shifts your lower jaw forward. A CPAP device. This device blows air through a mask when you breathe out (exhale). An EPAP device. This has valves that you put in each nostril. A BPAP device. This device blows air through a mask when you breathe in (inhale) and breathe out. Having surgery if other treatments do not work. Follow these instructions at home: Lifestyle Make changes that your doctor recommends. Eat a healthy diet. Lose weight if needed. Avoid alcohol, medicines to help you relax, and some pain medicines. Do not smoke or use any products that contain nicotine or tobacco. If you need help quitting, ask your doctor. General instructions Take over-the-counter and prescription medicines only as told by your doctor. If you were given a machine to use while you sleep, use it only as told by your doctor. If you are having surgery, make sure to tell your doctor you have sleep apnea. You may need to bring your device with you. Keep all follow-up visits. Contact a doctor if: The machine that you were given to use during sleep bothers you or does not seem to be working. You do not get better. You get worse. Get help right away if: Your chest hurts. You have trouble breathing in enough air. You have an uncomfortable feeling in your back, arms, or stomach. You have trouble talking. One side of your body feels weak. A part of your face is hanging down. These symptoms may be an emergency. Get help right away. Call your local emergency services (911 in the [place] Do not wait to see if the symptoms  will go away. Do not drive yourself to the hospital. Summary This condition affects breathing during sleep. The most common cause is a collapsed or blocked airway. The goal of treatment is to help you breathe normally while you sleep. This information is not intended to replace advice given to you by your health care provider. Make  sure you discuss any questions you have with your health care provider. Document Revised: 03/19/2020 Document Reviewed: 03/19/2020 Elsevier Patient Education  2022 [employer]. Chronic Obstructive Pulmonary Disease Chronic obstructive pulmonary disease (COPD) is a long-term (chronic) lung problem. When you have COPD, it is hard for air to get in and out of your lungs. Usually the condition gets worse over time, and your lungs will never return to normal. There are things you can do to keep yourself as healthy as possible. What are the causes? Smoking. This is the most common cause. Certain genes passed from parent to child (inherited). What increases the risk? Being exposed to secondhand smoke from cigarettes, pipes, or cigars. Being exposed to chemicals and other irritants, such as fumes and dust in the work environment. Having chronic lung conditions or infections. What are the signs or symptoms? Shortness of breath, especially during physical activity. A long-term cough with a large amount of thick mucus. Sometimes, the cough Gentles not have any mucus (dry cough). Wheezing. Breathing quickly. Skin that looks gray or blue, especially in the fingers, toes, or lips. Feeling tired (fatigue). Weight loss. Chest tightness. Having infections often. Episodes when breathing symptoms become much worse (exacerbations). At the later stages of this disease, you [place] have swelling in the ankles, feet, or legs. How is this treated? Taking medicines. Quitting smoking, if you smoke. Rehabilitation. This includes steps to make your body work better. It [place] involve a team of specialists. Doing exercises. Making changes to your diet. Using oxygen. Lung surgery. Lung transplant. Comfort measures (palliative care). Follow these instructions at home: Medicines Take over-the-counter and prescription medicines only as told by your doctor. Talk to your doctor before taking any cough or allergy  medicines. You [place] need to avoid medicines that cause your lungs to be dry. Lifestyle If you smoke, stop smoking. Smoking makes the problem worse. Do not smoke or use any products that contain nicotine or tobacco. If you need help quitting, ask your doctor. Avoid being around things that make your breathing worse. This [place] include smoke, chemicals, and fumes. Stay active, but remember to rest as well. Learn and use tips on how to manage stress and control your breathing. Make sure you get enough sleep. Most adults need at least 7 hours of sleep every night. Eat healthy foods. Eat smaller meals more often. Rest before meals. Controlled breathing Learn and use tips on how to control your breathing as told by your doctor. Try: Breathing in (inhaling) through your nose for 1 second. Then, pucker your lips and breath out (exhale) through your lips for 2 seconds. Putting one hand on your belly (abdomen). Breathe in slowly through your nose for 1 second. Your hand on your belly should move out. Pucker your lips and breathe out slowly through your lips. Your hand on your belly should move in as you breathe out.  Controlled coughing Learn and use controlled coughing to clear mucus from your lungs. Follow these steps: Lean your head a little forward. Breathe in deeply. Try to hold your breath for 3 seconds. Keep your mouth slightly open while coughing 2 times. Spit any mucus out into  a tissue. Rest and do the steps again 1 or 2 times as needed. General instructions Make sure you get all the shots (vaccines) that your doctor recommends. Ask your doctor about a flu shot and a pneumonia shot. Use oxygen therapy and pulmonary rehabilitation if told by your doctor. If you need home oxygen therapy, ask your doctor if you should buy a tool to measure your oxygen level (oximeter). Make a COPD action plan with your doctor. This helps you to know what to do if you feel worse than usual. Manage any other  conditions you have as told by your doctor. Avoid going outside when it is very hot, cold, or humid. Avoid people who have a sickness you can catch (contagious). Keep all follow-up visits. Contact a doctor if: You cough up more mucus than usual. There is a change in the color or thickness of the mucus. It is harder to breathe than usual. Your breathing is faster than usual. You have trouble sleeping. You need to use your medicines more often than usual. You have trouble doing your normal activities such as getting dressed or walking around the house. Get help right away if: You have shortness of breath while resting. You have shortness of breath that stops you from: Being able to talk. Doing normal activities. Your chest hurts for longer than 5 minutes. Your skin color is more blue than usual. Your pulse oximeter shows that you have low oxygen for longer than 5 minutes. You have a fever. You feel too tired to breathe normally. These symptoms may represent a serious problem that is an emergency. Do not wait to see if the symptoms will go away. Get medical help right away. Call your local emergency services (911 in the [place] Do not drive yourself to the hospital. Summary Chronic obstructive pulmonary disease (COPD) is a long-term lung problem. The way your lungs work will never return to normal. Usually the condition gets worse over time. There are things you can do to keep yourself as healthy as possible. Take over-the-counter and prescription medicines only as told by your doctor. If you smoke, stop. Smoking makes the problem worse. This information is not intended to replace advice given to you by your health care provider. Make sure you discuss any questions you have with your health care provider. Document Revised: 02/17/2020 Document Reviewed: 02/17/2020 Elsevier Patient Education  2022 [employer].

## 2021-01-27 DIAGNOSIS — I351 Nonrheumatic aortic (valve) insufficiency: Secondary | ICD-10-CM | POA: Diagnosis not present

## 2021-01-27 DIAGNOSIS — I10 Essential (primary) hypertension: Secondary | ICD-10-CM | POA: Diagnosis not present

## 2021-01-27 DIAGNOSIS — E1169 Type 2 diabetes mellitus with other specified complication: Secondary | ICD-10-CM | POA: Diagnosis not present

## 2021-01-27 DIAGNOSIS — J449 Chronic obstructive pulmonary disease, unspecified: Secondary | ICD-10-CM | POA: Diagnosis not present

## 2021-01-27 DIAGNOSIS — E663 Overweight: Secondary | ICD-10-CM | POA: Diagnosis not present

## 2021-01-27 DIAGNOSIS — I2510 Atherosclerotic heart disease of native coronary artery without angina pectoris: Secondary | ICD-10-CM | POA: Diagnosis not present

## 2021-01-27 DIAGNOSIS — I340 Nonrheumatic mitral (valve) insufficiency: Secondary | ICD-10-CM | POA: Diagnosis not present

## 2021-01-27 DIAGNOSIS — G4739 Other sleep apnea: Secondary | ICD-10-CM | POA: Diagnosis not present

## 2021-01-28 DIAGNOSIS — G4733 Obstructive sleep apnea (adult) (pediatric): Secondary | ICD-10-CM | POA: Diagnosis not present

## 2021-01-28 DIAGNOSIS — J449 Chronic obstructive pulmonary disease, unspecified: Secondary | ICD-10-CM | POA: Diagnosis not present

## 2021-02-09 ENCOUNTER — Ambulatory Visit: Payer: Medicare HMO | Admitting: Internal Medicine

## 2021-02-18 DIAGNOSIS — L851 Acquired keratosis [keratoderma] palmaris et plantaris: Secondary | ICD-10-CM | POA: Diagnosis not present

## 2021-02-18 DIAGNOSIS — E663 Overweight: Secondary | ICD-10-CM | POA: Diagnosis not present

## 2021-02-18 DIAGNOSIS — L603 Nail dystrophy: Secondary | ICD-10-CM | POA: Diagnosis not present

## 2021-02-18 DIAGNOSIS — E782 Mixed hyperlipidemia: Secondary | ICD-10-CM | POA: Diagnosis not present

## 2021-02-18 DIAGNOSIS — E1142 Type 2 diabetes mellitus with diabetic polyneuropathy: Secondary | ICD-10-CM | POA: Diagnosis not present

## 2021-02-18 DIAGNOSIS — E1169 Type 2 diabetes mellitus with other specified complication: Secondary | ICD-10-CM | POA: Diagnosis not present

## 2021-02-18 DIAGNOSIS — J449 Chronic obstructive pulmonary disease, unspecified: Secondary | ICD-10-CM | POA: Diagnosis not present

## 2021-02-18 DIAGNOSIS — I10 Essential (primary) hypertension: Secondary | ICD-10-CM | POA: Diagnosis not present

## 2021-02-18 DIAGNOSIS — G4739 Other sleep apnea: Secondary | ICD-10-CM | POA: Diagnosis not present

## 2021-02-21 DIAGNOSIS — R5383 Other fatigue: Secondary | ICD-10-CM | POA: Diagnosis not present

## 2021-02-21 DIAGNOSIS — E782 Mixed hyperlipidemia: Secondary | ICD-10-CM | POA: Diagnosis not present

## 2021-02-21 DIAGNOSIS — E1169 Type 2 diabetes mellitus with other specified complication: Secondary | ICD-10-CM | POA: Diagnosis not present

## 2021-02-21 DIAGNOSIS — M109 Gout, unspecified: Secondary | ICD-10-CM | POA: Diagnosis not present

## 2021-02-28 DIAGNOSIS — G4733 Obstructive sleep apnea (adult) (pediatric): Secondary | ICD-10-CM | POA: Diagnosis not present

## 2021-02-28 DIAGNOSIS — E875 Hyperkalemia: Secondary | ICD-10-CM | POA: Diagnosis not present

## 2021-02-28 DIAGNOSIS — Z23 Encounter for immunization: Secondary | ICD-10-CM | POA: Diagnosis not present

## 2021-02-28 DIAGNOSIS — J449 Chronic obstructive pulmonary disease, unspecified: Secondary | ICD-10-CM | POA: Diagnosis not present

## 2021-03-07 ENCOUNTER — Other Ambulatory Visit: Payer: Self-pay

## 2021-03-07 ENCOUNTER — Ambulatory Visit: Payer: Medicare HMO | Admitting: Dermatology

## 2021-03-07 ENCOUNTER — Encounter: Payer: Self-pay | Admitting: Dermatology

## 2021-03-07 DIAGNOSIS — I10 Essential (primary) hypertension: Secondary | ICD-10-CM | POA: Diagnosis not present

## 2021-03-07 DIAGNOSIS — L578 Other skin changes due to chronic exposure to nonionizing radiation: Secondary | ICD-10-CM

## 2021-03-07 DIAGNOSIS — Z85828 Personal history of other malignant neoplasm of skin: Secondary | ICD-10-CM

## 2021-03-07 DIAGNOSIS — C44329 Squamous cell carcinoma of skin of other parts of face: Secondary | ICD-10-CM

## 2021-03-07 DIAGNOSIS — L570 Actinic keratosis: Secondary | ICD-10-CM | POA: Diagnosis not present

## 2021-03-07 DIAGNOSIS — D485 Neoplasm of uncertain behavior of skin: Secondary | ICD-10-CM

## 2021-03-07 NOTE — Progress Notes (Signed)
Follow-Up Visit   Subjective  [name] [name] is a 75 y.o. male who presents for the following: Follow-up (Hx BCC right neck below ear, 3 month follow-up. Biopsy and [facility] 12/06/20.). He also has a spot on his right cheek that won't heal. He picks at area and it bleeds. He has a history of SCC of the right malar cheek.  The following portions of the chart were reviewed this encounter and updated as appropriate:       Review of Systems:  No other skin or systemic complaints except as noted in HPI or Assessment and Plan.  Objective  Well appearing patient in no apparent distress; mood and affect are within normal limits.  A focused examination was performed including face, neck. Relevant physical exam findings are noted in the Assessment and Plan.  R neck below ear Well healed scar with no evidence of recurrence.   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    Assessment & Plan  Actinic Damage - chronic, secondary to cumulative UV radiation exposure/sun exposure over time - diffuse scaly erythematous macules with underlying dyspigmentation - Recommend daily broad spectrum sunscreen SPF 30+ to sun-exposed areas, reapply every 2 hours as needed.  - Recommend staying in the shade or wearing long sleeves, sun glasses (UVA+UVB protection) and wide brim hats (4-inch brim around the entire circumference of the hat). - Call for new or changing lesions.  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 Observe for recurrence. Call clinic for new or changing lesions.  Recommend regular skin exams, daily broad-spectrum spf 30+ sunscreen use, and photoprotection.    Neoplasm of uncertain behavior of skin R zygoma  Skin / nail biopsy Type of biopsy: tangential   Informed consent: discussed and consent obtained   Patient was prepped and draped in usual sterile fashion: Area  prepped with alcohol. Anesthesia: the lesion was anesthetized in a standard fashion   Anesthetic:  1% lidocaine w/ epinephrine 1-100,000 buffered w/ 8.4% NaHCO3 Instrument used: flexible razor blade   Hemostasis achieved with: pressure, aluminum chloride and electrodesiccation   Outcome: patient tolerated procedure well   Post-procedure details: wound care instructions given   Post-procedure details comment:  Ointment and small bandage applied  Specimen 1 - Surgical pathology Differential Diagnosis: Inflamed SK r/o SCC/BCC Check Margins: No 7.18mm pink crusted papule  If positive, plan MOHS vs excision. If exc here, would need to see patient for appt prior to surgery.   AK (actinic keratosis) (6) R ear helix x 2, R frontal scalp x 4  Actinic keratoses are precancerous spots that appear secondary to cumulative UV radiation exposure/sun exposure over time. They are chronic with expected duration over 1 year. A portion of actinic keratoses will progress to squamous cell carcinoma of the skin. It is not possible to reliably predict which spots will progress to skin cancer and so treatment is recommended to prevent development of skin cancer.  Recommend daily broad spectrum sunscreen SPF 30+ to sun-exposed areas, reapply every 2 hours as needed.  Recommend staying in the shade or wearing long sleeves, sun glasses (UVA+UVB protection) and wide brim hats (4-inch brim around the entire circumference of the hat). Call for new or changing lesions.  Destruction of lesion - R ear helix x 2, R frontal scalp x 4  Destruction method: cryotherapy   Informed consent: discussed and  consent obtained   Lesion destroyed using liquid nitrogen: Yes   Region frozen until ice ball extended beyond lesion: Yes   Outcome: patient tolerated procedure well with no complications   Post-procedure details: wound care instructions given   Additional details:  Prior to procedure, discussed risks of blister formation,  small wound, skin dyspigmentation, or rare scar following cryotherapy. Recommend Vaseline ointment to treated areas while healing.   Return in about 6 months (around 09/04/2021) for UBSE.  IJamesetta Orleans, CMA, am acting as scribe for Brendolyn Patty, MD .  Documentation: I have reviewed the above documentation for accuracy and completeness, and I agree with the above.  Brendolyn Patty MD

## 2021-03-07 NOTE — Patient Instructions (Signed)
Wound Care Instructions  Cleanse wound gently with soap and water once a day then pat dry with clean gauze. Apply a thing coat of Petrolatum (petroleum jelly, "Vaseline") over the wound (unless you have an allergy to this). We recommend that you use a new, sterile tube of Vaseline. Do not pick or remove scabs. Do not remove the yellow or white "healing tissue" from the base of the wound.  Cover the wound with fresh, clean, nonstick gauze and secure with paper tape. You may use Band-Aids in place of gauze and tape if the would is small enough, but would recommend trimming much of the tape off as there is often too much. Sometimes Band-Aids can irritate the skin.  You should call the office for your biopsy report after 1 week if you have not already been contacted.  If you experience any problems, such as abnormal amounts of bleeding, swelling, significant bruising, significant pain, or evidence of infection, please call the office immediately.  FOR ADULT SURGERY PATIENTS: If you need something for pain relief you may take 1 extra strength Tylenol (acetaminophen) AND 2 Ibuprofen (200mg each) together every 4 hours as needed for pain. (do not take these if you are allergic to them or if you have a reason you should not take them.) Typically, you may only need pain medication for 1 to 3 days.   If you have any questions or concerns for your doctor, please call our main line at [phone] and press option 4 to reach your doctor's medical assistant. If no one answers, please leave a voicemail as directed and we will return your call as soon as possible. Messages left after 4 pm will be answered the following business day.   You may also send us a message via MyChart. We typically respond to MyChart messages within 1-2 business days.  For prescription refills, please ask your pharmacy to contact our office. Our fax number is [phone].  If you have an urgent issue when the clinic is closed that  cannot wait until the next business day, you can page your doctor at the number below.    Please note that while we do our best to be available for urgent issues outside of office hours, we are not available 24/7.   If you have an urgent issue and are unable to reach us, you may choose to seek medical care at your doctor's office, retail clinic, urgent care center, or emergency room.  If you have a medical emergency, please immediately call 911 or go to the emergency department.  Pager Numbers  - Dr. Kowalski: [phone]  - Dr. Moye: [phone]  - Dr. Stewart: [phone]  In the event of inclement weather, please call our main line at [phone] for an update on the status of any delays or closures.  Dermatology Medication Tips: Please keep the boxes that topical medications come in in order to help keep track of the instructions about where and how to use these. Pharmacies typically print the medication instructions only on the boxes and not directly on the medication tubes.   If your medication is too expensive, please contact our office at [phone] option 4 or send us a message through MyChart.   We are unable to tell what your co-pay for medications will be in advance as this is different depending on your insurance coverage. However, we may be able to find a substitute medication at lower cost or fill out paperwork to get insurance to cover a needed   medication.   If a prior authorization is required to get your medication covered by your insurance company, please allow us 1-2 business days to complete this process.  Drug prices often vary depending on where the prescription is filled and some pharmacies may offer cheaper prices.  The website www.goodrx.com contains coupons for medications through different pharmacies. The prices here do not account for what the cost may be with help from insurance (it may be cheaper with your insurance), but the website can give you the  price if you did not use any insurance.  - You can print the associated coupon and take it with your prescription to the pharmacy.  - You may also stop by our office during regular business hours and pick up a GoodRx coupon card.  - If you need your prescription sent electronically to a different pharmacy, notify our office through [facility] MyChart or by phone at [phone] option 4.   

## 2021-03-09 ENCOUNTER — Telehealth: Payer: Self-pay

## 2021-03-09 NOTE — Telephone Encounter (Signed)
-----   Message from Brendolyn Patty, MD sent at 03/08/2021  6:55 PM EST ----- Skin , R zygoma MODERATELY DIFFERENTIATED SQUAMOUS CELL CARCINOMA, CRUSTED  SCC skin cancer, recommend Mohs surgery vrs excision in office.  If he wants to have it removed here, schedule an appointment with me to evaluate area prior to surgery.   - please call patient

## 2021-03-09 NOTE — Telephone Encounter (Signed)
Advised patient biopsy of the right zygoma was SCC. Discussed Mohs vs excision here, patient prefers exc here. Appt scheduled prior to surgery to evaluate site 05/02/20 at 9:30pm. Surgery scheduled 05/16/20 at 3:30pm. Patient advised that Mohs would need to be scheduled if Dr Chauncey Cruel doesn't feel she can do surgery in office.

## 2021-04-13 DIAGNOSIS — N050 Unspecified nephritic syndrome with minor glomerular abnormality: Secondary | ICD-10-CM | POA: Diagnosis not present

## 2021-04-13 DIAGNOSIS — I129 Hypertensive chronic kidney disease with stage 1 through stage 4 chronic kidney disease, or unspecified chronic kidney disease: Secondary | ICD-10-CM | POA: Diagnosis not present

## 2021-04-13 DIAGNOSIS — E1122 Type 2 diabetes mellitus with diabetic chronic kidney disease: Secondary | ICD-10-CM | POA: Diagnosis not present

## 2021-04-13 DIAGNOSIS — N2581 Secondary hyperparathyroidism of renal origin: Secondary | ICD-10-CM | POA: Diagnosis not present

## 2021-04-13 DIAGNOSIS — N184 Chronic kidney disease, stage 4 (severe): Secondary | ICD-10-CM | POA: Diagnosis not present

## 2021-04-13 DIAGNOSIS — R809 Proteinuria, unspecified: Secondary | ICD-10-CM | POA: Diagnosis not present

## 2021-04-29 DIAGNOSIS — E663 Overweight: Secondary | ICD-10-CM | POA: Diagnosis not present

## 2021-04-29 DIAGNOSIS — I340 Nonrheumatic mitral (valve) insufficiency: Secondary | ICD-10-CM | POA: Diagnosis not present

## 2021-04-29 DIAGNOSIS — I10 Essential (primary) hypertension: Secondary | ICD-10-CM | POA: Diagnosis not present

## 2021-04-29 DIAGNOSIS — J449 Chronic obstructive pulmonary disease, unspecified: Secondary | ICD-10-CM | POA: Diagnosis not present

## 2021-04-29 DIAGNOSIS — G4739 Other sleep apnea: Secondary | ICD-10-CM | POA: Diagnosis not present

## 2021-04-29 DIAGNOSIS — E1169 Type 2 diabetes mellitus with other specified complication: Secondary | ICD-10-CM | POA: Diagnosis not present

## 2021-04-29 DIAGNOSIS — I2510 Atherosclerotic heart disease of native coronary artery without angina pectoris: Secondary | ICD-10-CM | POA: Diagnosis not present

## 2021-04-29 DIAGNOSIS — I351 Nonrheumatic aortic (valve) insufficiency: Secondary | ICD-10-CM | POA: Diagnosis not present

## 2021-04-29 DIAGNOSIS — E782 Mixed hyperlipidemia: Secondary | ICD-10-CM | POA: Diagnosis not present

## 2021-05-02 ENCOUNTER — Other Ambulatory Visit: Payer: Self-pay

## 2021-05-02 ENCOUNTER — Ambulatory Visit (INDEPENDENT_AMBULATORY_CARE_PROVIDER_SITE_OTHER): Payer: Medicare HMO | Admitting: Dermatology

## 2021-05-02 ENCOUNTER — Encounter: Payer: Self-pay | Admitting: Dermatology

## 2021-05-02 DIAGNOSIS — C44329 Squamous cell carcinoma of skin of other parts of face: Secondary | ICD-10-CM | POA: Diagnosis not present

## 2021-05-02 DIAGNOSIS — L578 Other skin changes due to chronic exposure to nonionizing radiation: Secondary | ICD-10-CM | POA: Diagnosis not present

## 2021-05-02 DIAGNOSIS — C4492 Squamous cell carcinoma of skin, unspecified: Secondary | ICD-10-CM

## 2021-05-02 DIAGNOSIS — L570 Actinic keratosis: Secondary | ICD-10-CM

## 2021-05-02 NOTE — Patient Instructions (Addendum)

## 2021-05-02 NOTE — Progress Notes (Signed)
° °  Follow-Up Visit   Subjective  [name] [name] is a 76 y.o. male who presents for the following: SCC bx proven (R zygoma, pt presents to discuss txt options).  The patient has spots, moles and lesions to be evaluated, some may be new or changing and the patient has concerns that these could be cancer.   The following portions of the chart were reviewed this encounter and updated as appropriate:       Review of Systems:  No other skin or systemic complaints except as noted in HPI or Assessment and Plan.  Objective  Well appearing patient in no apparent distress; mood and affect are within normal limits.  A focused examination was performed including face. Relevant physical exam findings are noted in the Assessment and Plan.  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  Destruction of lesion  Destruction method: electrodesiccation and curettage   Informed consent: discussed and consent obtained   Timeout:  patient name, date of birth, surgical site, and procedure verified Anesthesia: the lesion was anesthetized in a standard fashion   Anesthetic:  1% lidocaine w/ epinephrine 1-100,000 local infiltration Curettage performed in three different directions: Yes   Electrodesiccation performed over the curetted area: Yes   Lesion length (cm):  1.7 Lesion width (cm):  1 Margin per side (cm):  0 Final wound size (cm):  1.7 Hemostasis achieved with:  pressure, aluminum chloride and electrodesiccation Outcome: patient tolerated procedure well with no complications   Post-procedure details: wound care instructions given   Additional details:  Mupirocin ointment and Bandaid applied    Bx proven  EDC today, discussed if recurs will require excision  AK (actinic keratosis) (12) occipital scalp x 3, R frontal scalp x 4,  L lat forehead x 2, crown scalp x 3  Destruction of lesion - occipital scalp x 3, R frontal scalp x 4, L lat forehead x 2, crown scalp x 3  Destruction method: cryotherapy   Informed consent: discussed and consent obtained   Lesion destroyed using liquid nitrogen: Yes   Region frozen until ice ball extended beyond lesion: Yes   Outcome: patient tolerated procedure well with no complications   Post-procedure details: wound care instructions given   Additional details:  Prior to procedure, discussed risks of blister formation, small wound, skin dyspigmentation, or rare scar following cryotherapy. Recommend Vaseline ointment to treated areas while healing.    Actinic Damage - chronic, secondary to cumulative UV radiation exposure/sun exposure over time - diffuse scaly erythematous macules with underlying dyspigmentation - Recommend daily broad spectrum sunscreen SPF 30+ to sun-exposed areas, reapply every 2 hours as needed.  - Recommend staying in the shade or wearing long sleeves, sun glasses (UVA+UVB protection) and wide brim hats (4-inch brim around the entire circumference of the hat). - Call for new or changing lesions.   Return for as scheduled for UBSE.  I, Othelia Pulling, RMA, am acting as scribe for Brendolyn Patty, MD .  Documentation: I have reviewed the above documentation for accuracy and completeness, and I agree with the above.  Brendolyn Patty MD

## 2021-05-16 ENCOUNTER — Encounter: Payer: Medicare HMO | Admitting: Dermatology

## 2021-05-20 DIAGNOSIS — M109 Gout, unspecified: Secondary | ICD-10-CM | POA: Diagnosis not present

## 2021-05-20 DIAGNOSIS — I10 Essential (primary) hypertension: Secondary | ICD-10-CM | POA: Diagnosis not present

## 2021-05-20 DIAGNOSIS — E1169 Type 2 diabetes mellitus with other specified complication: Secondary | ICD-10-CM | POA: Diagnosis not present

## 2021-05-20 DIAGNOSIS — E663 Overweight: Secondary | ICD-10-CM | POA: Diagnosis not present

## 2021-05-20 DIAGNOSIS — E782 Mixed hyperlipidemia: Secondary | ICD-10-CM | POA: Diagnosis not present

## 2021-05-23 DIAGNOSIS — E1169 Type 2 diabetes mellitus with other specified complication: Secondary | ICD-10-CM | POA: Diagnosis not present

## 2021-05-23 DIAGNOSIS — E782 Mixed hyperlipidemia: Secondary | ICD-10-CM | POA: Diagnosis not present

## 2021-05-23 DIAGNOSIS — E1142 Type 2 diabetes mellitus with diabetic polyneuropathy: Secondary | ICD-10-CM | POA: Diagnosis not present

## 2021-05-23 DIAGNOSIS — B351 Tinea unguium: Secondary | ICD-10-CM | POA: Diagnosis not present

## 2021-05-23 DIAGNOSIS — I10 Essential (primary) hypertension: Secondary | ICD-10-CM | POA: Diagnosis not present

## 2021-06-07 ENCOUNTER — Encounter: Payer: Self-pay | Admitting: Internal Medicine

## 2021-06-07 LAB — PULMONARY FUNCTION TEST

## 2021-06-20 ENCOUNTER — Other Ambulatory Visit: Payer: Self-pay

## 2021-06-20 DIAGNOSIS — R059 Cough, unspecified: Secondary | ICD-10-CM | POA: Diagnosis not present

## 2021-06-20 DIAGNOSIS — R918 Other nonspecific abnormal finding of lung field: Secondary | ICD-10-CM | POA: Diagnosis not present

## 2021-06-20 DIAGNOSIS — J449 Chronic obstructive pulmonary disease, unspecified: Secondary | ICD-10-CM

## 2021-06-20 DIAGNOSIS — R0600 Dyspnea, unspecified: Secondary | ICD-10-CM | POA: Diagnosis not present

## 2021-06-20 MED ORDER — FLUTICASONE-SALMETEROL 115-21 MCG/ACT IN AERO: 2.0000 | INHALATION_SPRAY | Freq: Two times a day (BID) | RESPIRATORY_TRACT | 3 refills | Status: DC | PRN

## 2021-06-20 MED ORDER — IPRATROPIUM-ALBUTEROL 0.5-2.5 (3) MG/3ML IN SOLN: 3.0000 mL | Freq: Four times a day (QID) | RESPIRATORY_TRACT | Status: DC | PRN

## 2021-06-21 ENCOUNTER — Encounter: Payer: Self-pay | Admitting: Internal Medicine

## 2021-06-22 ENCOUNTER — Other Ambulatory Visit: Payer: Self-pay

## 2021-06-22 MED ORDER — IPRATROPIUM-ALBUTEROL 0.5-2.5 (3) MG/3ML IN SOLN: 3.0000 mL | RESPIRATORY_TRACT | 3 refills | Status: DC | PRN

## 2021-06-28 DIAGNOSIS — R918 Other nonspecific abnormal finding of lung field: Secondary | ICD-10-CM | POA: Diagnosis not present

## 2021-07-18 DIAGNOSIS — I340 Nonrheumatic mitral (valve) insufficiency: Secondary | ICD-10-CM | POA: Diagnosis not present

## 2021-07-21 DIAGNOSIS — N2581 Secondary hyperparathyroidism of renal origin: Secondary | ICD-10-CM | POA: Diagnosis not present

## 2021-07-21 DIAGNOSIS — N184 Chronic kidney disease, stage 4 (severe): Secondary | ICD-10-CM | POA: Diagnosis not present

## 2021-07-21 DIAGNOSIS — R809 Proteinuria, unspecified: Secondary | ICD-10-CM | POA: Diagnosis not present

## 2021-07-21 DIAGNOSIS — N050 Unspecified nephritic syndrome with minor glomerular abnormality: Secondary | ICD-10-CM | POA: Diagnosis not present

## 2021-07-21 DIAGNOSIS — I129 Hypertensive chronic kidney disease with stage 1 through stage 4 chronic kidney disease, or unspecified chronic kidney disease: Secondary | ICD-10-CM | POA: Diagnosis not present

## 2021-07-21 DIAGNOSIS — E1122 Type 2 diabetes mellitus with diabetic chronic kidney disease: Secondary | ICD-10-CM | POA: Diagnosis not present

## 2021-07-22 DIAGNOSIS — I129 Hypertensive chronic kidney disease with stage 1 through stage 4 chronic kidney disease, or unspecified chronic kidney disease: Secondary | ICD-10-CM | POA: Diagnosis not present

## 2021-07-22 DIAGNOSIS — N184 Chronic kidney disease, stage 4 (severe): Secondary | ICD-10-CM | POA: Diagnosis not present

## 2021-07-22 DIAGNOSIS — E1122 Type 2 diabetes mellitus with diabetic chronic kidney disease: Secondary | ICD-10-CM | POA: Diagnosis not present

## 2021-07-25 ENCOUNTER — Ambulatory Visit: Payer: Medicare HMO | Admitting: Internal Medicine

## 2021-07-25 DIAGNOSIS — I10 Essential (primary) hypertension: Secondary | ICD-10-CM | POA: Diagnosis not present

## 2021-07-25 DIAGNOSIS — E1122 Type 2 diabetes mellitus with diabetic chronic kidney disease: Secondary | ICD-10-CM | POA: Diagnosis not present

## 2021-07-25 DIAGNOSIS — N2581 Secondary hyperparathyroidism of renal origin: Secondary | ICD-10-CM | POA: Diagnosis not present

## 2021-07-25 DIAGNOSIS — N184 Chronic kidney disease, stage 4 (severe): Secondary | ICD-10-CM | POA: Diagnosis not present

## 2021-07-25 DIAGNOSIS — R809 Proteinuria, unspecified: Secondary | ICD-10-CM | POA: Diagnosis not present

## 2021-07-25 DIAGNOSIS — N050 Unspecified nephritic syndrome with minor glomerular abnormality: Secondary | ICD-10-CM | POA: Diagnosis not present

## 2021-07-25 DIAGNOSIS — D631 Anemia in chronic kidney disease: Secondary | ICD-10-CM | POA: Diagnosis not present

## 2021-07-28 ENCOUNTER — Encounter: Payer: Self-pay | Admitting: Internal Medicine

## 2021-07-28 ENCOUNTER — Ambulatory Visit: Payer: Medicare HMO | Admitting: Internal Medicine

## 2021-07-28 VITALS — BP 130/70 | HR 78 | Temp 98.0°F | Resp 18 | Ht 63.0 in | Wt 200.0 lb

## 2021-07-28 DIAGNOSIS — E1169 Type 2 diabetes mellitus with other specified complication: Secondary | ICD-10-CM | POA: Diagnosis not present

## 2021-07-28 DIAGNOSIS — Z7189 Other specified counseling: Secondary | ICD-10-CM

## 2021-07-28 DIAGNOSIS — E663 Overweight: Secondary | ICD-10-CM | POA: Diagnosis not present

## 2021-07-28 DIAGNOSIS — I509 Heart failure, unspecified: Secondary | ICD-10-CM | POA: Diagnosis not present

## 2021-07-28 DIAGNOSIS — G4733 Obstructive sleep apnea (adult) (pediatric): Secondary | ICD-10-CM | POA: Diagnosis not present

## 2021-07-28 DIAGNOSIS — Z9981 Dependence on supplemental oxygen: Secondary | ICD-10-CM

## 2021-07-28 DIAGNOSIS — I340 Nonrheumatic mitral (valve) insufficiency: Secondary | ICD-10-CM | POA: Diagnosis not present

## 2021-07-28 DIAGNOSIS — G4739 Other sleep apnea: Secondary | ICD-10-CM | POA: Diagnosis not present

## 2021-07-28 DIAGNOSIS — J449 Chronic obstructive pulmonary disease, unspecified: Secondary | ICD-10-CM

## 2021-07-28 DIAGNOSIS — I351 Nonrheumatic aortic (valve) insufficiency: Secondary | ICD-10-CM | POA: Diagnosis not present

## 2021-07-28 DIAGNOSIS — E782 Mixed hyperlipidemia: Secondary | ICD-10-CM | POA: Diagnosis not present

## 2021-07-28 DIAGNOSIS — I10 Essential (primary) hypertension: Secondary | ICD-10-CM | POA: Diagnosis not present

## 2021-07-28 DIAGNOSIS — I2510 Atherosclerotic heart disease of native coronary artery without angina pectoris: Secondary | ICD-10-CM | POA: Diagnosis not present

## 2021-07-28 NOTE — Patient Instructions (Signed)
Sleep Apnea ?Sleep apnea affects breathing during sleep. It causes breathing to stop for 10 seconds or more, or to become shallow. People with sleep apnea usually snore loudly. ?It can also increase the risk of: ?Heart attack. ?Stroke. ?Being very overweight (obese). ?Diabetes. ?Heart failure. ?Irregular heartbeat. ?High blood pressure. ?The goal of treatment is to help you breathe normally again. ?What are the causes? ?The most common cause of this condition is a collapsed or blocked airway. ?There are three kinds of sleep apnea: ?Obstructive sleep apnea. This is caused by a blocked or collapsed airway. ?Central sleep apnea. This happens when the brain does not send the right signals to the muscles that control breathing. ?Mixed sleep apnea. This is a combination of obstructive and central sleep apnea. ?What increases the risk? ?Being overweight. ?Smoking. ?Having a small airway. ?Being older. ?Being male. ?Drinking alcohol. ?Taking medicines to calm yourself (sedatives or tranquilizers). ?Having family members with the condition. ?Having a tongue or tonsils that are larger than normal. ?What are the signs or symptoms? ?Trouble staying asleep. ?Loud snoring. ?Headaches in the morning. ?Waking up gasping. ?Dry mouth or sore throat in the morning. ?Being sleepy or tired during the day. ?If you are sleepy or tired during the day, you may also: ?Not be able to focus your mind (concentrate). ?Forget things. ?Get angry a lot and have mood swings. ?Feel sad (depressed). ?Have changes in your personality. ?Have less interest in sex, if you are male. ?Be unable to have an erection, if you are male. ?How is this treated? ? ?Sleeping on your side. ?Using a medicine to get rid of mucus in your nose (decongestant). ?Avoiding the use of alcohol, medicines to help you relax, or certain pain medicines (narcotics). ?Losing weight, if needed. ?Changing your diet. ?Quitting smoking. ?Using a machine to open your airway while you  sleep, such as: ?An oral appliance. This is a mouthpiece that shifts your lower jaw forward. ?A CPAP device. This device blows air through a mask when you breathe out (exhale). ?An EPAP device. This has valves that you put in each nostril. ?A BIPAP device. This device blows air through a mask when you breathe in (inhale) and breathe out. ?Having surgery if other treatments do not work. ?Follow these instructions at home: ?Lifestyle ?Make changes that your doctor recommends. ?Eat a healthy diet. ?Lose weight if needed. ?Avoid alcohol, medicines to help you relax, and some pain medicines. ?Do not smoke or use any products that contain nicotine or tobacco. If you need help quitting, ask your doctor. ?General instructions ?Take over-the-counter and prescription medicines only as told by your doctor. ?If you were given a machine to use while you sleep, use it only as told by your doctor. ?If you are having surgery, make sure to tell your doctor you have sleep apnea. You may need to bring your device with you. ?Keep all follow-up visits. ?Contact a doctor if: ?The machine that you were given to use during sleep bothers you or does not seem to be working. ?You do not get better. ?You get worse. ?Get help right away if: ?Your chest hurts. ?You have trouble breathing in enough air. ?You have an uncomfortable feeling in your back, arms, or stomach. ?You have trouble talking. ?One side of your body feels weak. ?A part of your face is hanging down. ?These symptoms may be an emergency. Get help right away. Call your local emergency services (911 in the [place] ?Do not wait to see if the symptoms   will go away. ?Do not drive yourself to the hospital. ?Summary ?This condition affects breathing during sleep. ?The most common cause is a collapsed or blocked airway. ?The goal of treatment is to help you breathe normally while you sleep. ?This information is not intended to replace advice given to you by your health care provider. Make  sure you discuss any questions you have with your health care provider. ?Document Revised: 11/17/2020 Document Reviewed: 03/19/2020 ?Elsevier Patient Education ? 2022 [employer] ? ?

## 2021-07-28 NOTE — Progress Notes (Signed)
[facility] ?[address] ?[place], [place] [address] ? ?Pulmonary Sleep Medicine  ? ?Office Visit Note ? ?Patient Name: [name] [name] ?DOB: May 23, 1945 ?MRN [id number] ? ?Date of Service: 07/28/2021 ? ?Complaints/HPI: COPD. He has been doing OK at baseline with his breathing. He is using his oxygen as prescribed. Patient is on 3lpm and does OK with it. He had PFT done and this is stable. States he was at his cardiologist and has had a scan. States his heart is doing better also. Patient had a valve replacement which has been functioning well ? ?ROS ? ?General: (-) fever, (-) chills, (-) night sweats, (-) weakness ?Skin: (-) rashes, (-) itching,. ?Eyes: (-) visual changes, (-) redness, (-) itching. ?Nose and Sinuses: (-) nasal stuffiness or itchiness, (-) postnasal drip, (-) nosebleeds, (-) sinus trouble. ?Mouth and Throat: (-) sore throat, (-) hoarseness. ?Neck: (-) swollen glands, (-) enlarged thyroid, (-) neck pain. ?Respiratory: - cough, (-) bloody sputum, + shortness of breath, - wheezing. ?Cardiovascular: - ankle swelling, (-) chest pain. ?Lymphatic: (-) lymph node enlargement. ?Neurologic: (-) numbness, (-) tingling. ?Psychiatric: (-) anxiety, (-) depression ? ? ?Current Medication: ?Outpatient Encounter Medications as of 07/28/2021  ?Medication Sig Note  ? acetaminophen (TYLENOL) 650 MG CR tablet Take 1,300 mg by mouth every 8 (eight) hours as needed for pain.   ? albuterol (VENTOLIN HFA) 108 (90 Base) MCG/ACT inhaler Inhale 2 puffs into the lungs every 6 (six) hours as needed for wheezing or shortness of breath.   ? Ascorbic Acid (VITAMIN C) 1000 MG tablet Take 1,000 mg by mouth at bedtime.   ? aspirin EC 81 MG EC tablet Take 1 tablet (81 mg total) by mouth daily.   ? Cholecalciferol (VITAMIN D) 50 MCG (2000 UT) tablet Take 2,000 Units by mouth daily.   ? citalopram (CELEXA) 40 MG tablet Take 40 mg by mouth every morning.   ? Evolocumab (REPATHA SURECLICK) 387 MG/ML SOAJ Inject 140 mg  into the skin every 14 (fourteen) days. 11/23/2020: Next injection due on 12/01/20  ? ezetimibe (ZETIA) 10 MG tablet Take 10 mg by mouth at bedtime.   ? fexofenadine (ALLEGRA) 180 MG tablet Take 180 mg by mouth daily as needed for allergies or rhinitis.   ? fluticasone (FLONASE) 50 MCG/ACT nasal spray Place 2 sprays into both nostrils daily. (Patient taking differently: Place 2 sprays into both nostrils daily as needed for allergies.)   ? fluticasone-salmeterol (ADVAIR HFA) 115-21 MCG/ACT inhaler Inhale 2 puffs into the lungs 2 (two) times daily as needed (shortness of breath).   ? furosemide (LASIX) 20 MG tablet Take 20 mg by mouth every morning.   ? gabapentin (NEURONTIN) 100 MG capsule Take 2 capsules (200 mg total) by mouth 2 (two) times daily. (Patient taking differently: Take 200 mg by mouth in the morning.)   ? gabapentin (NEURONTIN) 400 MG capsule Take 400 mg by mouth at bedtime.   ? ipratropium-albuterol (DUONEB) 0.5-2.5 (3) MG/3ML SOLN Take 3 mLs by nebulization every 4 (four) hours as needed.   ? methocarbamol (ROBAXIN) 750 MG tablet Take 750 mg by mouth 2 (two) times daily as needed for muscle spasms.   ? metoprolol succinate (TOPROL-XL) 50 MG 24 hr tablet Take 50 mg by mouth every morning. Take with or immediately following a meal.   ? NON FORMULARY cpap w/o2   ? Omega-3 Fatty Acids (OMEGA-3 FISH OIL PO) Take 1 tablet by mouth daily.   ? OXYGEN Inhale 3 L into the lungs See  admin instructions. Used as needed throughout the day and continuous at bedtime   ? pantoprazole (PROTONIX) 40 MG tablet Take 40 mg by mouth every morning.   ? Probiotic Product (PROBIOTIC PO) Take 1 capsule by mouth daily.   ? Simethicone (GAS-X PO) Take 1 tablet by mouth 2 (two) times daily as needed (flatulence).   ? sodium chloride (OCEAN) 0.65 % SOLN nasal spray Place 1 spray into both nostrils as needed for congestion.   ? SUPER B COMPLEX/C PO Take 1 tablet by mouth daily.   ? tamsulosin (FLOMAX) 0.4 MG CAPS capsule Take 1 capsule  (0.4 mg total) by mouth daily.   ? traMADol (ULTRAM) 50 MG tablet Take 1 tablet (50 mg total) by mouth every 6 (six) hours as needed for moderate pain.   ? ?Facility-Administered Encounter Medications as of 07/28/2021  ?Medication  ? ipratropium-albuterol (DUONEB) 0.5-2.5 (3) MG/3ML nebulizer solution 3 mL  ? ? ?Surgical History: ?Past Surgical History:  ?Procedure Laterality Date  ? AORTIC VALVE REPLACEMENT N/A 08/01/2016  ? 29 mm CoreValve Evolut; Location: [facility]; Surgeon: Durward Mallard, MD  ? CARDIAC CATHETERIZATION N/A 12/26/2012  ? 2v CAD; retained pigtail in myocardium --> transferred to [facility]; Location: [facility]; Surgeon: Lowanda Foster, MD  ? COLONOSCOPY    ? KNEE ARTHROSCOPY WITH MEDIAL MENISECTOMY Left 12/02/2020  ? Procedure: KNEE ARTHROSCOPY WITH DEBRIDEMENT AND PARTIAL MEDIAL AND LATERAL MENISECTOMY;  Surgeon: Corky Mull, MD;  Location: [facility];  Service: Orthopedics;  Laterality: Left;  ? LITHOTRIPSY    ? PERCUTANEOUS REMOVAL INTRA-AORTIC BALLOON CATH N/A 12/26/2012  ? Procedure: PERCUTANEOUS REMOVAL INTRA-AORTIC BALLOON CATH; Surgeon: Sande Brothers, MD; Location: [facility]; Service: Cardiothoracic  ? RIGHT HEART CATH Right 07/13/2016  ? Location: [facility]; Surgeon: Pura Spice, MD  ? TRANSESOPHAGEAL ECHOCARDIOGRAM N/A 12/26/2012  ? Procedure: TRANSESOPHAGEAL ECHOCARDIOGRAPHY; Surgeon: Sande Brothers, MD; Location: [facility]; Service: Cardiothoracic  ? UMBILICAL HERNIA REPAIR N/A 02/13/2014  ? Procedure: LAPAROSCOPIC UMBILICAL HERNIA REPAIR; Surgeon: Clydene Pugh, MD; Location: [facility]; Service: General Surgery  ? ? ?Medical History: ?Past Medical History:  ?Diagnosis Date  ? Aortic stenosis, severe   ? s/p TAVR 08/01/2016  ? Arthritis   ? Basal cell carcinoma 01/04/2010  ? Right sup. post. helix. Excised 03/02/2010  ? BCC (basal cell carcinoma of skin) 11/24/2020  ? R neck below the ear, [facility]  ? CAD (coronary artery disease)   ? CKD (chronic kidney disease), stage IV  ([place])   ? COPD (chronic obstructive pulmonary disease) ([place])   ? DOE (dyspnea on exertion)   ? GERD (gastroesophageal reflux disease)   ? Heart murmur   ? HFrEF (heart failure with reduced ejection fraction) ([place])   ? History of hiatal hernia   ? 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 HLD (hyperlipidemia)   ? Hyperkalemia   ? Hypertension   ? Kidney stones   ? Migraines   ? OSA on CPAP   ? Pneumonia 07/2020  ? 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 Right malar cheek. MOHS.  ? 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/07/2021  ? Right zygoma, ED[facility] 05/02/21  ? Supplemental oxygen dependent   ? 3-4 L/[place]  ? T2DM (type 2 diabetes mellitus) ([place])   ? ? ?Family History: ?Family History  ?Adopted: Yes  ?Problem Relation Age of Onset  ? Varicose Veins Daughter   ? Obesity Daughter   ? Hyperlipidemia Daughter   ? Diabetes Daughter   ?  COPD Daughter   ? Depression Daughter   ? Asthma Daughter   ? Arthritis Son   ? Diabetes Son   ? Hypertension Son   ? ? ?Social History: ?Social History  ? ?Socioeconomic History  ? Marital status: Married  ?  Spouse name: Not on file  ? Number of children: Not on file  ? Years of education: Not on file  ? Highest education level: Not on file  ?Occupational History  ? Not on file  ?Tobacco Use  ? Smoking status: Former  ?  Packs/day: 2.00  ?  Years: 54.00  ?  Pack years: 108.00  ?  Types: Cigarettes  ?  Quit date: 2010  ?  Years since quitting: 13.2  ? Smokeless tobacco: Former  ?  Types: Chew  ?  Quit date: 05/16/2004  ?Vaping Use  ? Vaping Use: Never used  ?Substance and Sexual Activity  ? Alcohol use: No  ? Drug use: Never  ? Sexual activity: Not on file  ?Other Topics Concern  ? Not on file  ?Social History Narrative  ? Not on file  ? ?Social Determinants of Health  ? ?Financial Resource Strain: Not on file  ?Food Insecurity: Not on file  ?Transportation Needs: Not on file  ?Physical Activity: Not on file  ?Stress: Not on file  ?Social Connections: Not on file   ?Intimate Partner Violence: Not on file  ? ? ?Vital Signs: ?Blood pressure 130/70, pulse 78, temperature 98 ?F (36.7 ?C), resp. rate 18, height '5\' 3"'$  (1.6 m), weight 200 lb (90.7 kg), SpO2 96 %. ? ?Exam

## 2021-08-15 DIAGNOSIS — R0600 Dyspnea, unspecified: Secondary | ICD-10-CM | POA: Diagnosis not present

## 2021-08-22 DIAGNOSIS — E559 Vitamin D deficiency, unspecified: Secondary | ICD-10-CM | POA: Diagnosis not present

## 2021-08-22 DIAGNOSIS — L603 Nail dystrophy: Secondary | ICD-10-CM | POA: Diagnosis not present

## 2021-08-22 DIAGNOSIS — E1142 Type 2 diabetes mellitus with diabetic polyneuropathy: Secondary | ICD-10-CM | POA: Diagnosis not present

## 2021-08-22 DIAGNOSIS — E1169 Type 2 diabetes mellitus with other specified complication: Secondary | ICD-10-CM | POA: Diagnosis not present

## 2021-08-22 DIAGNOSIS — I10 Essential (primary) hypertension: Secondary | ICD-10-CM | POA: Diagnosis not present

## 2021-08-22 DIAGNOSIS — E119 Type 2 diabetes mellitus without complications: Secondary | ICD-10-CM | POA: Diagnosis not present

## 2021-08-22 DIAGNOSIS — E782 Mixed hyperlipidemia: Secondary | ICD-10-CM | POA: Diagnosis not present

## 2021-08-23 DIAGNOSIS — E1169 Type 2 diabetes mellitus with other specified complication: Secondary | ICD-10-CM | POA: Diagnosis not present

## 2021-08-23 DIAGNOSIS — M109 Gout, unspecified: Secondary | ICD-10-CM | POA: Diagnosis not present

## 2021-08-23 DIAGNOSIS — I10 Essential (primary) hypertension: Secondary | ICD-10-CM | POA: Diagnosis not present

## 2021-08-23 DIAGNOSIS — J449 Chronic obstructive pulmonary disease, unspecified: Secondary | ICD-10-CM | POA: Diagnosis not present

## 2021-08-23 DIAGNOSIS — N184 Chronic kidney disease, stage 4 (severe): Secondary | ICD-10-CM | POA: Diagnosis not present

## 2021-08-31 DIAGNOSIS — M25561 Pain in right knee: Secondary | ICD-10-CM | POA: Diagnosis not present

## 2021-08-31 DIAGNOSIS — M1711 Unilateral primary osteoarthritis, right knee: Secondary | ICD-10-CM | POA: Diagnosis not present

## 2021-09-05 ENCOUNTER — Ambulatory Visit: Payer: Medicare HMO | Admitting: Dermatology

## 2021-09-05 DIAGNOSIS — L578 Other skin changes due to chronic exposure to nonionizing radiation: Secondary | ICD-10-CM | POA: Diagnosis not present

## 2021-09-05 DIAGNOSIS — L814 Other melanin hyperpigmentation: Secondary | ICD-10-CM

## 2021-09-05 DIAGNOSIS — Z85828 Personal history of other malignant neoplasm of skin: Secondary | ICD-10-CM

## 2021-09-05 DIAGNOSIS — L821 Other seborrheic keratosis: Secondary | ICD-10-CM | POA: Diagnosis not present

## 2021-09-05 DIAGNOSIS — Z1283 Encounter for screening for malignant neoplasm of skin: Secondary | ICD-10-CM

## 2021-09-05 DIAGNOSIS — L570 Actinic keratosis: Secondary | ICD-10-CM | POA: Diagnosis not present

## 2021-09-05 DIAGNOSIS — D229 Melanocytic nevi, unspecified: Secondary | ICD-10-CM

## 2021-09-05 NOTE — Progress Notes (Signed)
? ?Follow-Up Visit ?  ?Subjective  ?[name] [name] is a 76 y.o. male who presents for the following: Annual Exam (The patient presents for Upper Body Skin Exam (UBSE) for skin cancer screening and mole check.  The patient has spots, moles and lesions to be evaluated, some may be new or changing and the patient has concerns that these could be cancer. ). ? ? ? ?The following portions of the chart were reviewed this encounter and updated as appropriate:  ?  ?  ? ?Review of Systems:  No other skin or systemic complaints except as noted in HPI or Assessment and Plan. ? ?Objective  ?Well appearing patient in no apparent distress; mood and affect are within normal limits. ? ?All skin waist up examined. ? ?left medial cheek x 1, right upper temple x 1, left upper temple x 1, right hand dorsum x 2, vertex scalp x 5, crown scalp x 2, right chest x 2  (14) (14) ?Erythematous thin papules/macules with gritty scale.  ? ?left anterior  neck ?5.0 mm waxy brown macule  ? ? ? ?Assessment & Plan  ?AK (actinic keratosis) (14) ?left medial cheek x 1, right upper temple x 1, left upper temple x 1, right hand dorsum x 2, vertex scalp x 5, crown scalp x 2, right chest x 2  (14) ? ?Ak vs ISK ? ?Actinic keratoses are precancerous spots that appear secondary to cumulative UV radiation exposure/sun exposure over time. They are chronic with expected duration over 1 year. A portion of actinic keratoses will progress to squamous cell carcinoma of the skin. It is not possible to reliably predict which spots will progress to skin cancer and so treatment is recommended to prevent development of skin cancer. ? ?Recommend daily broad spectrum sunscreen SPF 30+ to sun-exposed areas, reapply every 2 hours as needed.  ?Recommend staying in the shade or wearing long sleeves, sun glasses (UVA+UVB protection) and wide brim hats (4-inch brim around the entire circumference of the hat). ?Call for new or changing lesions.  ? ?Destruction of lesion - left  medial cheek x 1, right upper temple x 1, left upper temple x 1, right hand dorsum x 2, vertex scalp x 5, crown scalp x 2, right chest x 2  (14) ? ?Destruction method: cryotherapy   ?Informed consent: discussed and consent obtained   ?Lesion destroyed using liquid nitrogen: Yes   ?Region frozen until ice ball extended beyond lesion: Yes   ?Outcome: patient tolerated procedure well with no complications   ?Post-procedure details: wound care instructions given   ?Additional details:  Prior to procedure, discussed risks of blister formation, small wound, skin dyspigmentation, or rare scar following cryotherapy. Recommend Vaseline ointment to treated areas while healing.  ? ?Seborrheic keratosis ?left anterior  neck ? ?Reassured benign age-related growth.  Recommend observation.  Discussed cryotherapy if spot(s) become irritated or inflamed.  ? ?Seborrheic Keratoses ?Right anterior shoulder ?- Stuck-on, waxy, tan-brown papules and/or plaques  ?- Benign-appearing ?- Discussed benign etiology and prognosis. ?- Observe ?- Call for any changes ? ?Melanocytic Nevi ?- Tan-brown and/or pink-flesh-colored symmetric macules and papules ?- Benign appearing on exam today ?- Observation ?- Call clinic for new or changing moles ?- Recommend daily use of broad spectrum spf 30+ sunscreen to sun-exposed areas.  ? ?Lentigines ?- Scattered tan macules ?- Due to sun exposure ?- Benign-appering, observe ?- Recommend daily broad spectrum sunscreen SPF 30+ to sun-exposed areas, reapply every 2 hours as needed. ?- Call for any changes  ? ?  Actinic Damage ?- Chronic condition, secondary to cumulative UV/sun exposure ?- diffuse scaly erythematous macules with underlying dyspigmentation ?- Recommend daily broad spectrum sunscreen SPF 30+ to sun-exposed areas, reapply every 2 hours as needed.  ?- Staying in the shade or wearing long sleeves, sun glasses (UVA+UVB protection) and wide brim hats (4-inch brim around the entire circumference of the  hat) are also recommended for sun protection.  ?- Call for new or changing lesions. ? ?History of Basal Cell Carcinoma of the Skin ?Right sup post helix 03/02/2010 ?Right neck below ear 11/24/2020 ?- No evidence of recurrence today ?- Recommend regular full body skin exams ?- Recommend daily broad spectrum sunscreen SPF 30+ to sun-exposed areas, reapply every 2 hours as needed.  ?- Call if any new or changing lesions are noted between office visits  ? ?History of Squamous Cell Carcinoma of the Skin ?Right malar cheek 05/05/2019 ?Right zygoma 03/07/2021 ?- No evidence of recurrence today ?- Recommend regular full body skin exams ?- Recommend daily broad spectrum sunscreen SPF 30+ to sun-exposed areas, reapply every 2 hours as needed.  ?- Call if any new or changing lesions are noted between office visits ? ?  ? ?Skin cancer screening performed today.  ? ?Return in about 6 months (around 03/08/2022), or sun exposed areas. ? ?I, Marye Round, CMA, am acting as scribe for Brendolyn Patty, MD .  ? ?Documentation: I have reviewed the above documentation for accuracy and completeness, and I agree with the above. ? ?Brendolyn Patty MD  ?

## 2021-09-05 NOTE — Patient Instructions (Addendum)

## 2021-09-15 DIAGNOSIS — N184 Chronic kidney disease, stage 4 (severe): Secondary | ICD-10-CM | POA: Diagnosis not present

## 2021-09-23 DIAGNOSIS — D509 Iron deficiency anemia, unspecified: Secondary | ICD-10-CM | POA: Diagnosis not present

## 2021-09-23 DIAGNOSIS — E1169 Type 2 diabetes mellitus with other specified complication: Secondary | ICD-10-CM | POA: Diagnosis not present

## 2021-09-23 DIAGNOSIS — I959 Hypotension, unspecified: Secondary | ICD-10-CM | POA: Diagnosis not present

## 2021-09-23 DIAGNOSIS — E782 Mixed hyperlipidemia: Secondary | ICD-10-CM | POA: Diagnosis not present

## 2021-09-23 DIAGNOSIS — J449 Chronic obstructive pulmonary disease, unspecified: Secondary | ICD-10-CM | POA: Diagnosis not present

## 2021-09-23 DIAGNOSIS — R42 Dizziness and giddiness: Secondary | ICD-10-CM | POA: Diagnosis not present

## 2021-09-23 DIAGNOSIS — R5383 Other fatigue: Secondary | ICD-10-CM | POA: Diagnosis not present

## 2021-09-23 DIAGNOSIS — E663 Overweight: Secondary | ICD-10-CM | POA: Diagnosis not present

## 2021-09-26 DIAGNOSIS — D509 Iron deficiency anemia, unspecified: Secondary | ICD-10-CM | POA: Diagnosis not present

## 2021-09-26 DIAGNOSIS — E782 Mixed hyperlipidemia: Secondary | ICD-10-CM | POA: Diagnosis not present

## 2021-09-26 DIAGNOSIS — R42 Dizziness and giddiness: Secondary | ICD-10-CM | POA: Diagnosis not present

## 2021-09-26 DIAGNOSIS — E1169 Type 2 diabetes mellitus with other specified complication: Secondary | ICD-10-CM | POA: Diagnosis not present

## 2021-09-26 DIAGNOSIS — R5383 Other fatigue: Secondary | ICD-10-CM | POA: Diagnosis not present

## 2021-09-26 DIAGNOSIS — E663 Overweight: Secondary | ICD-10-CM | POA: Diagnosis not present

## 2021-09-26 DIAGNOSIS — J449 Chronic obstructive pulmonary disease, unspecified: Secondary | ICD-10-CM | POA: Diagnosis not present

## 2021-09-26 DIAGNOSIS — I959 Hypotension, unspecified: Secondary | ICD-10-CM | POA: Diagnosis not present

## 2021-10-04 DIAGNOSIS — E1122 Type 2 diabetes mellitus with diabetic chronic kidney disease: Secondary | ICD-10-CM | POA: Diagnosis not present

## 2021-10-04 DIAGNOSIS — N184 Chronic kidney disease, stage 4 (severe): Secondary | ICD-10-CM | POA: Diagnosis not present

## 2021-10-10 ENCOUNTER — Inpatient Hospital Stay: Payer: Medicare HMO | Attending: Oncology | Admitting: Oncology

## 2021-10-10 ENCOUNTER — Encounter: Payer: Self-pay | Admitting: Oncology

## 2021-10-10 ENCOUNTER — Inpatient Hospital Stay: Payer: Medicare HMO

## 2021-10-10 ENCOUNTER — Other Ambulatory Visit: Payer: Self-pay

## 2021-10-10 VITALS — BP 141/54 | HR 67 | Temp 97.8°F | Resp 20 | Wt 186.9 lb

## 2021-10-10 DIAGNOSIS — Z79899 Other long term (current) drug therapy: Secondary | ICD-10-CM | POA: Diagnosis not present

## 2021-10-10 DIAGNOSIS — D631 Anemia in chronic kidney disease: Secondary | ICD-10-CM

## 2021-10-10 DIAGNOSIS — I10 Essential (primary) hypertension: Secondary | ICD-10-CM | POA: Diagnosis not present

## 2021-10-10 DIAGNOSIS — N184 Chronic kidney disease, stage 4 (severe): Secondary | ICD-10-CM | POA: Diagnosis not present

## 2021-10-10 DIAGNOSIS — N2581 Secondary hyperparathyroidism of renal origin: Secondary | ICD-10-CM | POA: Diagnosis not present

## 2021-10-10 DIAGNOSIS — D509 Iron deficiency anemia, unspecified: Secondary | ICD-10-CM

## 2021-10-10 DIAGNOSIS — N050 Unspecified nephritic syndrome with minor glomerular abnormality: Secondary | ICD-10-CM | POA: Diagnosis not present

## 2021-10-10 DIAGNOSIS — E1122 Type 2 diabetes mellitus with diabetic chronic kidney disease: Secondary | ICD-10-CM | POA: Diagnosis not present

## 2021-10-10 LAB — RETICULOCYTES
Immature Retic Fract: 21.4 % — ABNORMAL HIGH (ref 2.3–15.9)
RBC.: 3.18 MIL/uL — ABNORMAL LOW (ref 4.22–5.81)
Retic Count, Absolute: 88.1 10*3/uL (ref 19.0–186.0)
Retic Ct Pct: 2.8 % (ref 0.4–3.1)

## 2021-10-10 LAB — CBC WITH DIFFERENTIAL/PLATELET
Abs Immature Granulocytes: 0.07 10*3/uL (ref 0.00–0.07)
Basophils Absolute: 0 10*3/uL (ref 0.0–0.1)
Basophils Relative: 0 %
Eosinophils Absolute: 0.2 10*3/uL (ref 0.0–0.5)
Eosinophils Relative: 4 %
HCT: 30.5 % — ABNORMAL LOW (ref 39.0–52.0)
Hemoglobin: 10.3 g/dL — ABNORMAL LOW (ref 13.0–17.0)
Immature Granulocytes: 1 %
Lymphocytes Relative: 24 %
Lymphs Abs: 1.3 10*3/uL (ref 0.7–4.0)
MCH: 32.5 pg (ref 26.0–34.0)
MCHC: 33.8 g/dL (ref 30.0–36.0)
MCV: 96.2 fL (ref 80.0–100.0)
Monocytes Absolute: 0.6 10*3/uL (ref 0.1–1.0)
Monocytes Relative: 11 %
Neutro Abs: 3.3 10*3/uL (ref 1.7–7.7)
Neutrophils Relative %: 60 %
Platelets: 198 10*3/uL (ref 150–400)
RBC: 3.17 MIL/uL — ABNORMAL LOW (ref 4.22–5.81)
RDW: 13 % (ref 11.5–15.5)
WBC: 5.5 10*3/uL (ref 4.0–10.5)
nRBC: 0 % (ref 0.0–0.2)

## 2021-10-10 LAB — COMPREHENSIVE METABOLIC PANEL
ALT: 11 U/L (ref 0–44)
AST: 16 U/L (ref 15–41)
Albumin: 3.2 g/dL — ABNORMAL LOW (ref 3.5–5.0)
Alkaline Phosphatase: 76 U/L (ref 38–126)
Anion gap: 11 (ref 5–15)
BUN: 58 mg/dL — ABNORMAL HIGH (ref 8–23)
CO2: 22 mmol/L (ref 22–32)
Calcium: 8.9 mg/dL (ref 8.9–10.3)
Chloride: 108 mmol/L (ref 98–111)
Creatinine, Ser: 3.38 mg/dL — ABNORMAL HIGH (ref 0.61–1.24)
GFR, Estimated: 18 mL/min — ABNORMAL LOW (ref >60)
Glucose, Bld: 113 mg/dL — ABNORMAL HIGH (ref 70–99)
Potassium: 5.1 mmol/L (ref 3.5–5.1)
Sodium: 141 mmol/L (ref 135–145)
Total Bilirubin: 0.5 mg/dL (ref 0.3–1.2)
Total Protein: 7.3 g/dL (ref 6.5–8.1)

## 2021-10-10 LAB — TSH: TSH: 2.158 u[IU]/mL (ref 0.350–4.500)

## 2021-10-10 LAB — FOLATE: Folate: 25 ng/mL (ref >5.9)

## 2021-10-10 LAB — VITAMIN B12: Vitamin B-12: 570 pg/mL (ref 180–914)

## 2021-10-10 LAB — FERRITIN: Ferritin: 213 ng/mL (ref 24–336)

## 2021-10-10 NOTE — Progress Notes (Signed)
Hematology/Oncology Consult note [facility] [phone] [phone]  Patient Care Team: Perrin Maltese, MD as PCP - General (Internal Medicine)   Name of the patient: [name] [name]  [id number]  05/16/1945    Reason for referral-iron deficiency anemia   Referring physician-Dr. Lamonte Sakai  Date of visit: 10/10/21   History of presenting illness- Patient is a 76 year old male with a past medical history significant for systolic congestive heart failure, type 2 diabetes, obstructive sleep apnea on CPAP, CKD, BPH among other medical problems.  He has been referred to [place] for anemia.  Most recent CBC from 10/04/2021 showed white cell count of 6.8, H&H of 10.3/30.4 with a platelet count of 179.  His baseline hemoglobin runs around 12 and was around that value until March 2023.  Patient does have baseline chronic kidney disease with a creatinine that fluctuates between 2.4-3.1.  Patient follows up with Dr. Zollie Scale for his stage IV chronic kidney disease.  I do not have any recent iron studies for him  Patient is here with his wife today.  He reports baseline fatigue and exertional shortness of breath  ECOG PS- 2  Pain scale- 0   Review of systems- Review of Systems  Constitutional:  Positive for malaise/fatigue. Negative for chills, fever and weight loss.  HENT:  Negative for congestion, ear discharge and nosebleeds.   Eyes:  Negative for blurred vision.  Respiratory:  Positive for shortness of breath. Negative for cough, hemoptysis, sputum production and wheezing.   Cardiovascular:  Negative for chest pain, palpitations, orthopnea and claudication.  Gastrointestinal:  Negative for abdominal pain, blood in stool, constipation, diarrhea, heartburn, melena, nausea and vomiting.  Genitourinary:  Negative for dysuria, flank pain, frequency, hematuria and urgency.  Musculoskeletal:  Negative for back pain, joint pain and myalgias.  Skin:  Negative  for rash.  Neurological:  Negative for dizziness, tingling, focal weakness, seizures, weakness and headaches.  Endo/Heme/Allergies:  Does not bruise/bleed easily.  Psychiatric/Behavioral:  Negative for depression and suicidal ideas. The patient does not have insomnia.     Allergies  Allergen Reactions   Nexletol [Bempedoic Acid] Diarrhea    fatigue   Nsaids     Avoid due to kidney disease     Patient Active Problem List   Diagnosis Date Noted   Complex tear of medial meniscus of left knee as current injury 10/18/2020   Primary osteoarthritis of left knee 10/18/2020   Hyperkalemia 07/30/2020   DM (diabetes mellitus), type 2 ([place]) 07/30/2020   OSA on CPAP 07/30/2020   Chronic combined systolic (congestive) and diastolic (congestive) heart failure ([place]) 07/30/2020   BPH (benign prostatic hyperplasia) 07/30/2020   GERD (gastroesophageal reflux disease) 07/30/2020   AKI (acute kidney injury) ([place]) 07/27/2020   Chronic respiratory failure with hypoxia ([place]) 07/27/2020   CKD (chronic kidney disease), stage IV ([place]) 07/27/2020   Essential hypertension 07/27/2020   CAP (community acquired pneumonia) 07/26/2020   Benign hypertensive kidney disease with chronic kidney disease 02/24/2019   Minimal change disease 02/24/2019   Proteinuria 02/24/2019   Secondary hyperparathyroidism of renal origin ([place]) 02/24/2019   Type 2 diabetes mellitus with diabetic chronic kidney disease ([place]) 02/24/2019   Acute postoperative pain 08/02/2016   On home oxygen therapy 08/02/2016   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   Hypoglycemia 07/26/2016   Idiopathic hypotension 07/26/2016   Acute renal failure (ARF) (HCC)    COPD exacerbation ([place])    Respiratory failure ([place]) 07/05/2016   Complete  tear of right rotator cuff 11/15/2015   Rotator cuff tendinitis, right 11/15/2015   ANA positive 07/12/2015   Arthralgia of multiple sites 07/12/2015   Accidental cut, puncture, perforation,  or hemorrhage during heart catheterization 12/27/2012   Coronary artery disease 12/27/2012   Hyperlipidemia 12/27/2012   Recurrent nephrolithiasis 12/27/2012   Personal history of other malignant neoplasm of skin 04/08/2012     Past Medical History:  Diagnosis Date   Aortic stenosis, 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 post. helix. Excised 03/02/2010   BCC (basal cell carcinoma of skin) 11/24/2020   R neck below the ear, EDC   CAD (coronary artery disease)    CKD (chronic kidney disease), stage IV (HCC)    COPD (chronic obstructive pulmonary disease) (HCC)    DOE (dyspnea on exertion)    GERD (gastroesophageal reflux disease)    Heart murmur    HFrEF (heart failure with reduced ejection fraction) ([place])    History of hiatal hernia    History of transcatheter aortic valve replacement (TAVR) 08/09/2016   HLD (hyperlipidemia)    Hyperkalemia    Hypertension    Kidney stones    Migraines    OSA on CPAP    Pneumonia 07/2020   Squamous cell carcinoma of skin 05/05/2019   Right malar cheek. MOHS.   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/07/2021   Right zygoma, EDC 05/02/21   Supplemental oxygen dependent    3-4 L/[place]   T2DM (type 2 diabetes mellitus) ([place])      Past Surgical History:  Procedure Laterality Date   AORTIC VALVE REPLACEMENT N/A 08/01/2016   29 mm CoreValve Evolut; Location: [facility]; Surgeon: Durward Mallard, MD   CARDIAC CATHETERIZATION N/A 12/26/2012   2v CAD; retained pigtail in myocardium --> transferred to [facility]; Location: [place]; Surgeon: Lowanda Foster, MD   COLONOSCOPY     KNEE ARTHROSCOPY WITH MEDIAL MENISECTOMY Left 12/02/2020   Procedure: KNEE ARTHROSCOPY WITH DEBRIDEMENT AND PARTIAL MEDIAL AND LATERAL MENISECTOMY;  Surgeon: Corky Mull, MD;  Location: [facility];  Service: Orthopedics;  Laterality: Left;   LITHOTRIPSY     PERCUTANEOUS REMOVAL INTRA-AORTIC BALLOON CATH N/A 12/26/2012   Procedure: PERCUTANEOUS  REMOVAL INTRA-AORTIC BALLOON CATH; Surgeon: Sande Brothers, MD; Location: [facility]; Service: Cardiothoracic   RIGHT HEART CATH Right 07/13/2016   Location: [facility]; Surgeon: Pura Spice, MD   TRANSESOPHAGEAL ECHOCARDIOGRAM N/A 12/26/2012   Procedure: TRANSESOPHAGEAL ECHOCARDIOGRAPHY; Surgeon: Sande Brothers, MD; Location: [facility]; Service: Cardiothoracic   UMBILICAL HERNIA REPAIR N/A 02/13/2014   Procedure: LAPAROSCOPIC UMBILICAL HERNIA REPAIR; Surgeon: Clydene Pugh, MD; Location: [facility]; Service: General Surgery    Social History   Socioeconomic History   Marital status: Married    Spouse name: Not on file   Number of children: Not on file   Years of education: Not on file   Highest education level: Not on file  Occupational History   Not on file  Tobacco Use   Smoking status: Former    Packs/day: 2.00    Years: 54.00    Total pack years: 108.00    Types: Cigarettes    Quit date: 2010    Years since quitting: 13.4   Smokeless tobacco: Former    Types: Chew    Quit date: 05/16/2004  Vaping Use   Vaping Use: Never used  Substance and Sexual Activity   Alcohol use: No  Drug use: Never   Sexual activity: Not on file  Other Topics Concern   Not on file  Social History Narrative   Not on file   Social Determinants of Health   Financial Resource Strain: Not on file  Food Insecurity: Not on file  Transportation Needs: Not on file  Physical Activity: Not on file  Stress: Not on file  Social Connections: Not on file  Intimate Partner Violence: Not on file     Family History  Adopted: Yes  Problem Relation Age of Onset   Varicose Veins Daughter    Obesity Daughter    Hyperlipidemia Daughter    Diabetes Daughter    COPD Daughter    Depression Daughter    Asthma Daughter    Arthritis Son    Diabetes Son    Hypertension Son      Current Outpatient Medications:    acetaminophen (TYLENOL) 650 MG CR tablet, Take  1,300 mg by mouth every 8 (eight) hours as needed for pain., Disp: , Rfl:    albuterol (VENTOLIN HFA) 108 (90 Base) MCG/ACT inhaler, Inhale 2 puffs into the lungs every 6 (six) hours as needed for wheezing or shortness of breath., Disp: , Rfl:    Ascorbic Acid (VITAMIN C) 1000 MG tablet, Take 1,000 mg by mouth at bedtime., Disp: , Rfl:    citalopram (CELEXA) 40 MG tablet, Take 40 mg by mouth every morning., Disp: , Rfl:    Evolocumab (REPATHA SURECLICK) 353 MG/ML SOAJ, Inject 140 mg into the skin every 14 (fourteen) days., Disp: , Rfl:    ezetimibe (ZETIA) 10 MG tablet, Take 10 mg by mouth at bedtime., Disp: , Rfl:    fexofenadine (ALLEGRA) 180 MG tablet, Take 180 mg by mouth daily as needed for allergies or rhinitis., Disp: , Rfl:    fluticasone (FLONASE) 50 MCG/ACT nasal spray, Place 2 sprays into both nostrils daily. (Patient taking differently: Place 2 sprays into both nostrils daily as needed for allergies.), Disp: , Rfl: 2   fluticasone-salmeterol (ADVAIR HFA) 115-21 MCG/ACT inhaler, Inhale 2 puffs into the lungs 2 (two) times daily as needed (shortness of breath)., Disp: 1 each, Rfl: 3   furosemide (LASIX) 20 MG tablet, Take 20 mg by mouth every morning., Disp: , Rfl:    gabapentin (NEURONTIN) 100 MG capsule, Take 2 capsules (200 mg total) by mouth 2 (two) times daily. (Patient taking differently: Take 200 mg by mouth in the morning.), Disp: , Rfl:    gabapentin (NEURONTIN) 400 MG capsule, Take 400 mg by mouth at bedtime., Disp: , Rfl:    ipratropium-albuterol (DUONEB) 0.5-2.5 (3) MG/3ML SOLN, Take 3 mLs by nebulization every 4 (four) hours as needed., Disp: 360 mL, Rfl: 3   losartan (COZAAR) 50 MG tablet, , Disp: , Rfl:    methocarbamol (ROBAXIN) 750 MG tablet, Take 750 mg by mouth 2 (two) times daily as needed for muscle spasms., Disp: , Rfl:    metoprolol succinate (TOPROL-XL) 50 MG 24 hr tablet, Take 50 mg by mouth every morning. Take with or immediately following a meal., Disp: , Rfl:     NON FORMULARY, cpap w/o2, Disp: , Rfl:    OXYGEN, Inhale 3 L into the lungs See admin instructions. Used as needed throughout the day and continuous at bedtime, Disp: , Rfl:    pantoprazole (PROTONIX) 40 MG tablet, Take 40 mg by mouth every morning., Disp: , Rfl:    rosuvastatin (CRESTOR) 5 MG tablet, Take 5 mg by mouth daily., Disp: ,  Rfl:    Simethicone (GAS-X PO), Take 1 tablet by mouth 2 (two) times daily as needed (flatulence)., Disp: , Rfl:    sodium chloride (OCEAN) 0.65 % SOLN nasal spray, Place 1 spray into both nostrils as needed for congestion., Disp: , Rfl: 0   SUPER B COMPLEX/C PO, Take 1 tablet by mouth daily., Disp: , Rfl:    tamsulosin (FLOMAX) 0.4 MG CAPS capsule, Take 1 capsule (0.4 mg total) by mouth daily., Disp: 30 capsule, Rfl: 0   aspirin EC 81 MG EC tablet, Take 1 tablet (81 mg total) by mouth daily. (Patient not taking: Reported on 10/10/2021), Disp: , Rfl:    Cholecalciferol (VITAMIN D) 50 MCG (2000 UT) tablet, Take 2,000 Units by mouth daily. (Patient not taking: Reported on 10/10/2021), Disp: , Rfl:    Omega-3 Fatty Acids (OMEGA-3 FISH OIL PO), Take 1 tablet by mouth daily. (Patient not taking: Reported on 10/10/2021), Disp: , Rfl:    Probiotic Product (PROBIOTIC PO), Take 1 capsule by mouth daily. (Patient not taking: Reported on 10/10/2021), Disp: , Rfl:    traMADol (ULTRAM) 50 MG tablet, Take 1 tablet (50 mg total) by mouth every 6 (six) hours as needed for moderate pain. (Patient not taking: Reported on 10/10/2021), Disp: 20 tablet, Rfl: 0  Current Facility-Administered Medications:    ipratropium-albuterol (DUONEB) 0.5-2.5 (3) MG/3ML nebulizer solution 3 mL, 3 mL, Nebulization, Q6H PRN, Allyne Gee, MD   Physical exam:  Vitals:   10/10/21 1318  BP: (!) 141/54  Pulse: 67  Resp: 20  Temp: 97.8 F (36.6 C)  SpO2: 100%  Weight: 186 lb 14.4 oz (84.8 kg)   Physical Exam Constitutional:      General: He is not in acute distress. Cardiovascular:     Rate and  Rhythm: Normal rate and regular rhythm.     Heart sounds: Normal heart sounds.  Pulmonary:     Effort: Pulmonary effort is normal.     Breath sounds: Normal breath sounds.  Abdominal:     General: Bowel sounds are normal.     Palpations: Abdomen is soft.  Skin:    General: Skin is warm and dry.  Neurological:     Mental Status: He is alert and oriented to person, place, and time.           Latest Ref Rng & Units 11/29/2020   11:42 AM  CMP  Glucose 70 - 99 mg/dL 124   BUN 8 - 23 mg/dL 28   Creatinine 0.61 - 1.24 mg/dL 2.21   Sodium 135 - 145 mmol/L 142   Potassium 3.5 - 5.1 mmol/L 4.2   Chloride 98 - 111 mmol/L 110   CO2 22 - 32 mmol/L 28   Calcium 8.9 - 10.3 mg/dL 8.7       Latest Ref Rng & Units 11/29/2020   11:42 AM  CBC  WBC 4.0 - 10.5 K/uL 5.0   Hemoglobin 13.0 - 17.0 g/dL 12.1   Hematocrit 39.0 - 52.0 % 35.8   Platelets 150 - 400 K/uL 128     Assessment and plan- Patient is a 76 y.o. male referred for normocytic anemia  Patient's baseline hemoglobin runs around 12.  Drifted down to 10.5 in June 2023.  Patient also has baseline CKD with a creatinine that fluctuates between 2.5-2 3.  I will do a complete anemia work-up today including CBC ferritin and iron studies B12 folate TSH haptoglobin reticulocyte count and myeloma panel.  In person or video visit  with the patient in 2 weeks time.  In the presence of chronic kidney disease it would be reasonable to keep his ferritin close to 200 and if his levels are lower than that we could consider giving him IV iron and see if his fatigue improves.  Given that his hemoglobin is more than 10 he does not require any EPO at this time   Thank you for this kind referral and the opportunity to participate in the care of this patient   Visit Diagnosis 1. Iron deficiency anemia, unspecified iron deficiency anemia type     Dr. Randa Evens, MD, MPH [facility] [phone] 10/10/2021

## 2021-10-11 LAB — HAPTOGLOBIN: Haptoglobin: 232 mg/dL (ref 34–355)

## 2021-10-14 LAB — MULTIPLE MYELOMA PANEL, SERUM
Albumin SerPl Elph-Mcnc: 2.9 g/dL (ref 2.9–4.4)
Albumin/Glob SerPl: 0.9 (ref 0.7–1.7)
Alpha 1: 0.2 g/dL (ref 0.0–0.4)
Alpha2 Glob SerPl Elph-Mcnc: 0.9 g/dL (ref 0.4–1.0)
B-Globulin SerPl Elph-Mcnc: 0.9 g/dL (ref 0.7–1.3)
Gamma Glob SerPl Elph-Mcnc: 1.3 g/dL (ref 0.4–1.8)
Globulin, Total: 3.4 g/dL (ref 2.2–3.9)
IgA: 315 mg/dL (ref 61–437)
IgG (Immunoglobin G), Serum: 1141 mg/dL (ref 603–1613)
IgM (Immunoglobulin M), Srm: 418 mg/dL — ABNORMAL HIGH (ref 15–143)
Total Protein ELP: 6.3 g/dL (ref 6.0–8.5)

## 2021-10-24 ENCOUNTER — Inpatient Hospital Stay: Payer: Medicare HMO | Attending: Oncology | Admitting: Oncology

## 2021-10-24 DIAGNOSIS — N184 Chronic kidney disease, stage 4 (severe): Secondary | ICD-10-CM | POA: Diagnosis not present

## 2021-10-24 DIAGNOSIS — D631 Anemia in chronic kidney disease: Secondary | ICD-10-CM | POA: Diagnosis not present

## 2021-10-24 NOTE — Progress Notes (Signed)
I connected with [name] [name] on 10/24/21 at  3:00 PM EDT by video enabled telemedicine visit and verified that I am speaking with the correct person using two identifiers.   I discussed the limitations, risks, security and privacy concerns of performing an evaluation and management service by telemedicine and the availability of in-person appointments. I also discussed with the patient that there may be a patient responsible charge related to this service. The patient expressed understanding and agreed to proceed.  Other persons participating in the visit and their role in the encounter:  patients wife  Patient's location:  home Provider's location:  work  Diagnosis: Normocytic anemia likely multifactorial secondary to chronic disease as well as anemia of chronic kidney disease  Chief Complaint: Discuss results of blood work  History of present illness:  Patient is a 76 year old male with a past medical history significant for systolic congestive heart failure, type 2 diabetes, obstructive sleep apnea on CPAP, CKD, BPH among other medical problems.  He has been referred to [place] for anemia.  Most recent CBC from 10/04/2021 showed white cell count of 6.8, H&H of 10.3/30.4 with a platelet count of 179.  His baseline hemoglobin runs around 12 and was around that value until March 2023.  Patient does have baseline chronic kidney disease with a creatinine that fluctuates between 2.4-3.1.  Patient follows up with Dr. Zollie [name] for his stage IV chronic kidney disease.    Results of blood work from 10/10/2021 were as follows: CBC showed white count of 5.5, H&H of 10.3/30.5 with a platelet count of 198.  CMP showed a serum creatinine of 3.38.  B12 folate normal.  Haptoglobin TSH normal.  Myeloma panel showed no M protein.  Ferritin levels normal at 213    Interval history patient has baseline fatigue and exertional shortness of breath which has remained unchanged.  No new complaints at this time   Review  of Systems  Constitutional:  Positive for malaise/fatigue. Negative for chills, fever and weight loss.  HENT:  Negative for congestion, ear discharge and nosebleeds.   Eyes:  Negative for blurred vision.  Respiratory:  Positive for shortness of breath. Negative for cough, hemoptysis, sputum production and wheezing.   Cardiovascular:  Negative for chest pain, palpitations, orthopnea and claudication.  Gastrointestinal:  Negative for abdominal pain, blood in stool, constipation, diarrhea, heartburn, melena, nausea and vomiting.  Genitourinary:  Negative for dysuria, flank pain, frequency, hematuria and urgency.  Musculoskeletal:  Negative for back pain, joint pain and myalgias.  Skin:  Negative for rash.  Neurological:  Negative for dizziness, tingling, focal weakness, seizures, weakness and headaches.  Endo/Heme/Allergies:  Does not bruise/bleed easily.  Psychiatric/Behavioral:  Negative for depression and suicidal ideas. The patient does not have insomnia.     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 post. helix. 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 MOHS.   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/07/2021   Right zygoma, EDC 05/02/21   Supplemental oxygen dependent    3-4 L/[place]   T2DM (type 2 diabetes mellitus) ([place])     Past Surgical History:  Procedure Laterality Date   AORTIC VALVE REPLACEMENT N/A 08/01/2016   29 mm CoreValve Evolut; Location: [facility]; Surgeon: Durward Mallard, MD   CARDIAC CATHETERIZATION N/A 12/26/2012   2v CAD; retained pigtail in myocardium --> transferred to [facility]; Location: [facility]; Surgeon: Lowanda Foster, MD   COLONOSCOPY     KNEE ARTHROSCOPY WITH MEDIAL MENISECTOMY Left 12/02/2020   Procedure: KNEE ARTHROSCOPY WITH DEBRIDEMENT AND PARTIAL MEDIAL AND LATERAL MENISECTOMY;  Surgeon: Corky Mull, MD;  Location: [facility];  Service: Orthopedics;  Laterality: Left;   LITHOTRIPSY     PERCUTANEOUS REMOVAL INTRA-AORTIC BALLOON CATH N/A 12/26/2012   Procedure: PERCUTANEOUS REMOVAL INTRA-AORTIC BALLOON CATH; Surgeon: Sande Brothers, MD; Location: [facility]; Service: Cardiothoracic   RIGHT HEART CATH Right 07/13/2016   Location: [facility]; Surgeon: Pura Spice, MD   TRANSESOPHAGEAL ECHOCARDIOGRAM N/A 12/26/2012   Procedure: TRANSESOPHAGEAL ECHOCARDIOGRAPHY; Surgeon: Sande Brothers, MD; Location: [facility]; Service: Cardiothoracic   UMBILICAL HERNIA REPAIR N/A 02/13/2014   Procedure: LAPAROSCOPIC UMBILICAL HERNIA REPAIR; Surgeon: Clydene Pugh, MD; Location: [facility]; Service: General Surgery    Social History   Socioeconomic History   Marital status: Married    Spouse name: Not on file   Number of children: Not on file   Years of education: Not on file   Highest education level: Not on file  Occupational History   Not on file  Tobacco Use   Smoking status: Former    Packs/day: 2.00    Years: 54.00    Total pack years: 108.00    Types: Cigarettes    Quit date: 2010    Years since quitting: 13.5   Smokeless tobacco: Former    Types: Chew    Quit date:  05/16/2004  Vaping Use   Vaping Use: Never used  Substance and Sexual Activity   Alcohol use: No   Drug use: Never   Sexual activity: Not on file  Other Topics Concern   Not on file  Social History Narrative   Not on file   Social Determinants of Health   Financial Resource Strain: Not on file  Food Insecurity: Not on file  Transportation Needs: Not on file  Physical Activity: Not on file  Stress: Not on file  Social Connections: Not on file  Intimate Partner Violence: Not on file    Family History  Adopted: Yes  Problem Relation Age of Onset   Varicose Veins Daughter    Obesity Daughter    Hyperlipidemia Daughter    Diabetes Daughter    COPD Daughter    Depression Daughter    Asthma Daughter    Arthritis Son    Diabetes Son    Hypertension Son      Current Outpatient Medications:    acetaminophen (TYLENOL) 650 MG CR tablet, Take 1,300 mg by mouth every 8 (eight)  hours as needed for pain., Disp: , Rfl:    albuterol (VENTOLIN HFA) 108 (90 Base) MCG/ACT inhaler, Inhale 2 puffs into the lungs every 6 (six) hours as needed for wheezing or shortness of breath., Disp: , Rfl:    Ascorbic Acid (VITAMIN C) 1000 MG tablet, Take 1,000 mg by mouth at bedtime., Disp: , Rfl:    aspirin EC 81 MG EC tablet, Take 1 tablet (81 mg total) by mouth daily. (Patient not taking: Reported on 10/10/2021), Disp: , Rfl:    Cholecalciferol (VITAMIN D) 50 MCG (2000 UT) tablet, Take 2,000 Units by mouth daily. (Patient not taking: Reported on 10/10/2021), Disp: , Rfl:    citalopram (CELEXA) 40 MG tablet, Take 40 mg by mouth every morning., Disp: , Rfl:    Evolocumab (REPATHA SURECLICK) 259 MG/ML SOAJ, Inject 140 mg into the skin every 14 (fourteen) days., Disp: , Rfl:    ezetimibe (ZETIA) 10 MG tablet, Take 10 mg by mouth at bedtime., Disp: , Rfl:    fexofenadine (ALLEGRA) 180 MG tablet, Take 180 mg by mouth daily as needed for allergies or rhinitis., Disp: , Rfl:    fluticasone (FLONASE) 50 MCG/ACT  nasal spray, Place 2 sprays into both nostrils daily. (Patient taking differently: Place 2 sprays into both nostrils daily as needed for allergies.), Disp: , Rfl: 2   fluticasone-salmeterol (ADVAIR HFA) 115-21 MCG/ACT inhaler, Inhale 2 puffs into the lungs 2 (two) times daily as needed (shortness of breath)., Disp: 1 each, Rfl: 3   furosemide (LASIX) 20 MG tablet, Take 20 mg by mouth every morning., Disp: , Rfl:    gabapentin (NEURONTIN) 100 MG capsule, Take 2 capsules (200 mg total) by mouth 2 (two) times daily. (Patient taking differently: Take 200 mg by mouth in the morning.), Disp: , Rfl:    gabapentin (NEURONTIN) 400 MG capsule, Take 400 mg by mouth at bedtime., Disp: , Rfl:    ipratropium-albuterol (DUONEB) 0.5-2.5 (3) MG/3ML SOLN, Take 3 mLs by nebulization every 4 (four) hours as needed., Disp: 360 mL, Rfl: 3   losartan (COZAAR) 50 MG tablet, , Disp: , Rfl:    methocarbamol (ROBAXIN) 750 MG tablet, Take 750 mg by mouth 2 (two) times daily as needed for muscle spasms., Disp: , Rfl:    metoprolol succinate (TOPROL-XL) 50 MG 24 hr tablet, Take 50 mg by mouth every morning. Take with or immediately following a meal., Disp: , Rfl:    NON FORMULARY, cpap w/o2, Disp: , Rfl:    Omega-3 Fatty Acids (OMEGA-3 FISH OIL PO), Take 1 tablet by mouth daily. (Patient not taking: Reported on 10/10/2021), Disp: , Rfl:    OXYGEN, Inhale 3 L into the lungs See admin instructions. Used as needed throughout the day and continuous at bedtime, Disp: , Rfl:    pantoprazole (PROTONIX) 40 MG tablet, Take 40 mg by mouth every morning., Disp: , Rfl:    Probiotic Product (PROBIOTIC PO), Take 1 capsule by mouth daily. (Patient not taking: Reported on 10/10/2021), Disp: , Rfl:    rosuvastatin (CRESTOR) 5 MG tablet, Take 5 mg by mouth daily., Disp: , Rfl:    Simethicone (GAS-X PO), Take 1 tablet by mouth 2 (two) times daily as needed (flatulence)., Disp: , Rfl:    sodium chloride (OCEAN) 0.65 % SOLN nasal spray, Place 1 spray  into both nostrils as needed for congestion., Disp: , Rfl: 0   SUPER B COMPLEX/C PO, Take 1 tablet by mouth daily., Disp: , Rfl:    tamsulosin (FLOMAX)  0.4 MG CAPS capsule, Take 1 capsule (0.4 mg total) by mouth daily., Disp: 30 capsule, Rfl: 0   traMADol (ULTRAM) 50 MG tablet, Take 1 tablet (50 mg total) by mouth every 6 (six) hours as needed for moderate pain. (Patient not taking: Reported on 10/10/2021), Disp: 20 tablet, Rfl: 0  Current Facility-Administered Medications:    ipratropium-albuterol (DUONEB) 0.5-2.5 (3) MG/3ML nebulizer solution 3 mL, 3 mL, Nebulization, Q6H PRN, Allyne Gee, MD  No results found.  No images are attached to the encounter.      Latest Ref Rng & Units 10/10/2021    2:48 PM  CMP  Glucose 70 - 99 mg/dL 113   BUN 8 - 23 mg/dL 58   Creatinine 0.61 - 1.24 mg/dL 3.38   Sodium 135 - 145 mmol/L 141   Potassium 3.5 - 5.1 mmol/L 5.1   Chloride 98 - 111 mmol/L 108   CO2 22 - 32 mmol/L 22   Calcium 8.9 - 10.3 mg/dL 8.9   Total Protein 6.5 - 8.1 g/dL 7.3   Total Bilirubin 0.3 - 1.2 mg/dL 0.5   Alkaline Phos 38 - 126 U/L 76   AST 15 - 41 U/L 16   ALT 0 - 44 U/L 11       Latest Ref Rng & Units 10/10/2021    2:48 PM  CBC  WBC 4.0 - 10.5 K/uL 5.5   Hemoglobin 13.0 - 17.0 g/dL 10.3   Hematocrit 39.0 - 52.0 % 30.5   Platelets 150 - 400 K/uL 198      Observation/objective: He is on home oxygen and appears to have baseline shortness of breath  Assessment and plan: Patient is a 76 year old male referred for normocytic anemia  Suspect normocytic anemia secondary to anemia of chronic disease from his underlying COPD as well as anemia of chronic kidney disease.  Ferritin levels are normal.  B12 folate TSH normal.  No evidence of myeloma or hemolysis.  He would not benefit from IV iron at this time.  EPO injections could be considered should his hemoglobin drop down to less than 10.  At this point I am planning to monitor his CBC conservatively without initiating  EPO.  Follow-up instructions: I will have his nephrology office fax his interim CBC in 3 months to [place] and I will see him back in 6 months with CBC ferritin and iron studies  I discussed the assessment and treatment plan with the patient. The patient was provided an opportunity to ask questions and all were answered. The patient agreed with the plan and demonstrated an understanding of the instructions.   The patient was advised to call back or seek an in-person evaluation if the symptoms worsen or if the condition fails to improve as anticipated.   Visit Diagnosis: 1. Anemia of chronic kidney failure, stage 4 (severe) (HCC)     Dr. Randa Evens, MD, MPH [facility] Tel- [phone] 10/24/2021 3:32 PM

## 2021-10-24 NOTE — Progress Notes (Signed)
Phoned patient for rooming prior to virtual visit. Other than ongoing fatigue, he reports no changes or concerns.

## 2021-11-01 ENCOUNTER — Encounter: Payer: Self-pay | Admitting: Internal Medicine

## 2021-11-01 ENCOUNTER — Ambulatory Visit: Payer: Medicare HMO | Admitting: Internal Medicine

## 2021-11-01 VITALS — BP 116/56 | HR 88 | Temp 98.0°F | Resp 16 | Ht 63.0 in | Wt 190.8 lb

## 2021-11-01 DIAGNOSIS — G4733 Obstructive sleep apnea (adult) (pediatric): Secondary | ICD-10-CM | POA: Diagnosis not present

## 2021-11-01 DIAGNOSIS — R0602 Shortness of breath: Secondary | ICD-10-CM

## 2021-11-01 DIAGNOSIS — Z9981 Dependence on supplemental oxygen: Secondary | ICD-10-CM

## 2021-11-01 DIAGNOSIS — Z7189 Other specified counseling: Secondary | ICD-10-CM

## 2021-11-01 NOTE — Progress Notes (Signed)
[facility] [place], [place] [address]  Pulmonary Sleep Medicine   Office Visit Note  Patient Name: [name] [name] DOB: 07/03/1945 MRN [id number]  Date of Service: 11/01/2021  Complaints/HPI: He is doing well. Using his oxygen as prescribed. He needs to requalify for the oxygen therapy. Patient states he has been on 3-4lpm with exertion. Patient staets some cough at times. Denies having chest pain. No sputum still SOB with exertion  ROS  General: (-) fever, (-) chills, (-) night sweats, (-) weakness Skin: (-) rashes, (-) itching,. Eyes: (-) visual changes, (-) redness, (-) itching. Nose and Sinuses: (-) nasal stuffiness or itchiness, (-) postnasal drip, (-) nosebleeds, (-) sinus trouble. Mouth and Throat: (-) sore throat, (-) hoarseness. Neck: (-) swollen glands, (-) enlarged thyroid, (-) neck pain. Respiratory: - cough, (-) bloody sputum, + shortness of breath, - wheezing. Cardiovascular: - ankle swelling, (-) chest pain. Lymphatic: (-) lymph node enlargement. Neurologic: (-) numbness, (-) tingling. Psychiatric: (-) anxiety, (-) depression   Current Medication: Outpatient Encounter Medications as of 11/01/2021  Medication Sig Note   acetaminophen (TYLENOL) 650 MG CR tablet Take 1,300 mg by mouth every 8 (eight) hours as needed for pain.    albuterol (VENTOLIN HFA) 108 (90 Base) MCG/ACT inhaler Inhale 2 puffs into the lungs every 6 (six) hours as needed for wheezing or shortness of breath.    Ascorbic Acid (VITAMIN C) 1000 MG tablet Take 1,000 mg by mouth at bedtime.    aspirin EC 81 MG EC tablet Take 1 tablet (81 mg total) by mouth daily. (Patient not taking: Reported on 10/10/2021)    Cholecalciferol (VITAMIN D) 50 MCG (2000 UT) tablet Take 2,000 Units by mouth daily. (Patient not taking: Reported on 10/10/2021)    citalopram (CELEXA) 40 MG tablet Take 40 mg by mouth every morning.    Evolocumab (REPATHA SURECLICK) 623 MG/ML SOAJ Inject 140 mg  into the skin every 14 (fourteen) days. 11/23/2020: Next injection due on 12/01/20   ezetimibe (ZETIA) 10 MG tablet Take 10 mg by mouth at bedtime.    fexofenadine (ALLEGRA) 180 MG tablet Take 180 mg by mouth daily as needed for allergies or rhinitis.    fluticasone (FLONASE) 50 MCG/ACT nasal spray Place 2 sprays into both nostrils daily. (Patient taking differently: Place 2 sprays into both nostrils daily as needed for allergies.)    fluticasone-salmeterol (ADVAIR HFA) 115-21 MCG/ACT inhaler Inhale 2 puffs into the lungs 2 (two) times daily as needed (shortness of breath).    furosemide (LASIX) 20 MG tablet Take 20 mg by mouth every morning.    gabapentin (NEURONTIN) 100 MG capsule Take 2 capsules (200 mg total) by mouth 2 (two) times daily. (Patient taking differently: Take 200 mg by mouth in the morning.)    gabapentin (NEURONTIN) 400 MG capsule Take 400 mg by mouth at bedtime.    ipratropium-albuterol (DUONEB) 0.5-2.5 (3) MG/3ML SOLN Take 3 mLs by nebulization every 4 (four) hours as needed.    losartan (COZAAR) 50 MG tablet     methocarbamol (ROBAXIN) 750 MG tablet Take 750 mg by mouth 2 (two) times daily as needed for muscle spasms.    metoprolol succinate (TOPROL-XL) 50 MG 24 hr tablet Take 50 mg by mouth every morning. Take with or immediately following a meal.    NON FORMULARY cpap w/o2    Omega-3 Fatty Acids (OMEGA-3 FISH OIL PO) Take 1 tablet by mouth daily. (Patient not taking: Reported on 10/10/2021)    OXYGEN Inhale  3 L into the lungs See admin instructions. Used as needed throughout the day and continuous at bedtime    pantoprazole (PROTONIX) 40 MG tablet Take 40 mg by mouth every morning.    Probiotic Product (PROBIOTIC PO) Take 1 capsule by mouth daily. (Patient not taking: Reported on 10/10/2021)    rosuvastatin (CRESTOR) 5 MG tablet Take 5 mg by mouth daily.    Simethicone (GAS-X PO) Take 1 tablet by mouth 2 (two) times daily as needed (flatulence).    sodium chloride (OCEAN) 0.65 %  SOLN nasal spray Place 1 spray into both nostrils as needed for congestion.    SUPER B COMPLEX/C PO Take 1 tablet by mouth daily.    tamsulosin (FLOMAX) 0.4 MG CAPS capsule Take 1 capsule (0.4 mg total) by mouth daily.    traMADol (ULTRAM) 50 MG tablet Take 1 tablet (50 mg total) by mouth every 6 (six) hours as needed for moderate pain. (Patient not taking: Reported on 10/10/2021)    Facility-Administered Encounter Medications as of 11/01/2021  Medication   ipratropium-albuterol (DUONEB) 0.5-2.5 (3) MG/3ML nebulizer solution 3 mL    Surgical History: Past Surgical History:  Procedure Laterality Date   AORTIC VALVE REPLACEMENT N/A 08/01/2016   29 mm CoreValve Evolut; Location: [facility]; Surgeon: Durward Mallard, MD   CARDIAC CATHETERIZATION N/A 12/26/2012   2v CAD; retained pigtail in myocardium --> transferred to [facility]; Location: [place]; Surgeon: Lowanda Foster, MD   COLONOSCOPY     KNEE ARTHROSCOPY WITH MEDIAL MENISECTOMY Left 12/02/2020   Procedure: KNEE ARTHROSCOPY WITH DEBRIDEMENT AND PARTIAL MEDIAL AND LATERAL MENISECTOMY;  Surgeon: Corky Mull, MD;  Location: [facility];  Service: Orthopedics;  Laterality: Left;   LITHOTRIPSY     PERCUTANEOUS REMOVAL INTRA-AORTIC BALLOON CATH N/A 12/26/2012   Procedure: PERCUTANEOUS REMOVAL INTRA-AORTIC BALLOON CATH; Surgeon: Sande Brothers, MD; Location: [facility]; Service: Cardiothoracic   RIGHT HEART CATH Right 07/13/2016   Location: [facility]; Surgeon: Pura Spice, MD   TRANSESOPHAGEAL ECHOCARDIOGRAM N/A 12/26/2012   Procedure: TRANSESOPHAGEAL ECHOCARDIOGRAPHY; Surgeon: Sande Brothers, MD; Location: [facility]; Service: Cardiothoracic   UMBILICAL HERNIA REPAIR N/A 02/13/2014   Procedure: LAPAROSCOPIC UMBILICAL HERNIA REPAIR; Surgeon: Clydene Pugh, MD; Location: [place]; Service: General Surgery    Medical History: Past Medical History:  Diagnosis Date   Aortic stenosis, severe    s/p TAVR 08/01/2016   Arthritis     Basal cell carcinoma 01/04/2010   Right sup. post. helix. 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 MOHS.   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/07/2021   Right zygoma, EDC 05/02/21   Supplemental oxygen dependent    3-4 L/[place]   T2DM (type 2 diabetes mellitus) ([place])     Family History: Family  History  Adopted: Yes  Problem Relation Age of Onset   Varicose Veins Daughter    Obesity Daughter    Hyperlipidemia Daughter    Diabetes Daughter    COPD Daughter    Depression Daughter    Asthma Daughter    Arthritis Son    Diabetes Son    Hypertension Son     Social History: Social History   Socioeconomic History   Marital status: Married    Spouse name: Not on file   Number of children: Not on file   Years of education: Not on file   Highest education level: Not on file  Occupational History   Not on file  Tobacco Use   Smoking status: Former    Packs/day: 2.00    Years: 54.00    Total pack years: 108.00    Types: Cigarettes    Quit date: 2010    Years since quitting: 13.5   Smokeless tobacco: Former    Types: Chew    Quit date: 05/16/2004  Vaping Use   Vaping Use: Never used  Substance and Sexual Activity   Alcohol use: No   Drug use: Never   Sexual activity: Not on file  Other Topics Concern   Not on file  Social History Narrative   Not on  file   Social Determinants of Health   Financial Resource Strain: Not on file  Food Insecurity: Not on file  Transportation Needs: Not on file  Physical Activity: Not on file  Stress: Not on file  Social Connections: Not on file  Intimate Partner Violence: Not on file    Vital Signs: Blood pressure (!) 116/56, pulse 88, temperature 98 F (36.7 C), resp. rate 16, weight 190 lb 12.8 oz (86.5 kg), SpO2 98 %.  Examination: General Appearance: The patient is well-developed, well-nourished, and in no distress. Skin: Gross inspection of skin unremarkable. Head: normocephalic, no gross deformities. Eyes: no gross deformities noted. ENT: ears appear grossly normal no exudates. Neck: Supple. No thyromegaly. No LAD. Respiratory: no rhonchi noted. Cardiovascular: Normal S1 and S2 without murmur or rub. Extremities: No cyanosis. pulses are equal. Neurologic: Alert and oriented. No involuntary movements.  LABS: Recent Results (from the past 2160 hour(s))  TSH     Status: None   Collection Time: 10/10/21  2:48 PM  Result Value Ref Range   TSH 2.158 0.350 - 4.500 uIU/mL    Comment: Performed by a 3rd Generation assay with a functional sensitivity of <=0.01 uIU/mL. Performed at [facility], [address] [place], [place] [address]   Haptoglobin     Status: None   Collection Time: 10/10/21  2:48 PM  Result Value Ref Range   Haptoglobin 232 34 - 355 mg/dL    Comment: (NOTE) Performed At: [facility] [place], [place] [id number] Rush Farmer MD [id number]   Multiple Myeloma Panel (SPEP&IFE w/QIG)     Status: Abnormal   Collection Time: 10/10/21  2:48 PM  Result Value Ref Range   IgG (Immunoglobin G), Serum 1,141 603 - 1,613 mg/dL   IgA 315 61 - 437 mg/dL   IgM (Immunoglobulin M), Srm 418 (H) 15 - 143 mg/dL   Total Protein ELP 6.3 6.0 - 8.5 g/dL   Albumin SerPl Elph-Mcnc 2.9 2.9 - 4.4 g/dL   Alpha 1 0.2 0.0 - 0.4 g/dL   Alpha2 Glob SerPl  Elph-Mcnc 0.9 0.4 - 1.0 g/dL   B-Globulin SerPl Elph-Mcnc 0.9 0.7 - 1.3 g/dL  Gamma Glob SerPl Elph-Mcnc 1.3 0.4 - 1.8 g/dL   M Protein SerPl Elph-Mcnc Not Observed Not Observed g/dL   Globulin, Total 3.4 2.2 - 3.9 g/dL   Albumin/Glob SerPl 0.9 0.7 - 1.7   IFE 1 Comment (A)     Comment: Polyclonal increase detected in one or more immunoglobulins.   Please Note Comment     Comment: (NOTE) Protein electrophoresis scan will follow via computer, mail, or courier delivery. Performed At: [facility] [place], [place] [id number] Rush Farmer MD [id number]   Reticulocytes     Status: Abnormal   Collection Time: 10/10/21  2:48 PM  Result Value Ref Range   Retic Ct Pct 2.8 0.4 - 3.1 %   RBC. 3.18 (L) 4.22 - 5.81 MIL/uL   Retic Count, Absolute 88.1 19.0 - 186.0 K/uL   Immature Retic Fract 21.4 (H) 2.3 - 15.9 %    Comment: Performed at [facility], [address] [place], [place] [address]  Ferritin     Status: None   Collection Time: 10/10/21  2:48 PM  Result Value Ref Range   Ferritin 213 24 - 336 ng/mL    Comment: Performed at [facility], [address] [place], [place] [address]  Vitamin B12     Status: None   Collection Time: 10/10/21  2:48 PM  Result Value Ref Range   Vitamin B-12 570 180 - 914 pg/mL    Comment: (NOTE) This assay is not validated for testing neonatal or myeloproliferative syndrome specimens for Vitamin B12 levels. Performed at [facility], [place] [address] [place], [place] [address]   Folate     Status: None   Collection Time: 10/10/21  2:48 PM  Result Value Ref Range   Folate 25.0 >5.9 ng/mL    Comment: Performed at [facility], [address] [place], [place] [address]  Comprehensive metabolic panel     Status: Abnormal   Collection Time: 10/10/21  2:48 PM  Result Value Ref Range   Sodium 141 135 - 145 mmol/L   Potassium 5.1 3.5 - 5.1 mmol/L   Chloride 108 98 - 111 mmol/L   CO2 22 22 -  32 mmol/L   Glucose, Bld 113 (H) 70 - 99 mg/dL    Comment: Glucose reference range applies only to samples taken after fasting for at least 8 hours.   BUN 58 (H) 8 - 23 mg/dL   Creatinine, Ser 3.38 (H) 0.61 - 1.24 mg/dL   Calcium 8.9 8.9 - 10.3 mg/dL   Total Protein 7.3 6.5 - 8.1 g/dL   Albumin 3.2 (L) 3.5 - 5.0 g/dL   AST 16 15 - 41 U/L   ALT 11 0 - 44 U/L   Alkaline Phosphatase 76 38 - 126 U/L   Total Bilirubin 0.5 0.3 - 1.2 mg/dL   GFR, Estimated 18 (L) >60 mL/min    Comment: (NOTE) Calculated using the CKD-EPI Creatinine Equation (2021)    Anion gap 11 5 - 15    Comment: Performed at [facility], [address] [place], [place] [address]  CBC with Differential/Platelet     Status: Abnormal   Collection Time: 10/10/21  2:48 PM  Result Value Ref Range   WBC 5.5 4.0 - 10.5 K/uL   RBC 3.17 (L) 4.22 - 5.81 MIL/uL   Hemoglobin 10.3 (L) 13.0 - 17.0 g/dL   HCT 30.5 (L) 39.0 - 52.0 %   MCV 96.2 80.0 - 100.0 fL  MCH 32.5 26.0 - 34.0 pg   MCHC 33.8 30.0 - 36.0 g/dL   RDW 13.0 11.5 - 15.5 %   Platelets 198 150 - 400 K/uL   nRBC 0.0 0.0 - 0.2 %   Neutrophils Relative % 60 %   Neutro Abs 3.3 1.7 - 7.7 K/uL   Lymphocytes Relative 24 %   Lymphs Abs 1.3 0.7 - 4.0 K/uL   Monocytes Relative 11 %   Monocytes Absolute 0.6 0.1 - 1.0 K/uL   Eosinophils Relative 4 %   Eosinophils Absolute 0.2 0.0 - 0.5 K/uL   Basophils Relative 0 %   Basophils Absolute 0.0 0.0 - 0.1 K/uL   Immature Granulocytes 1 %   Abs Immature Granulocytes 0.07 0.00 - 0.07 K/uL    Comment: Performed at [facility], [address] [place], [place] [address]    Radiology: No results found.  No results found.  No results found.    Assessment and Plan: Patient Active Problem List   Diagnosis Date Noted   Complex tear of medial meniscus of left knee as current injury 10/18/2020   Primary osteoarthritis of left knee 10/18/2020   Hyperkalemia 07/30/2020   DM (diabetes mellitus), type 2 ([place])  07/30/2020   OSA on CPAP 07/30/2020   Chronic combined systolic (congestive) and diastolic (congestive) heart failure ([place]) 07/30/2020   BPH (benign prostatic hyperplasia) 07/30/2020   GERD (gastroesophageal reflux disease) 07/30/2020   AKI (acute kidney injury) ([place]) 07/27/2020   Chronic respiratory failure with hypoxia ([place]) 07/27/2020   CKD (chronic kidney disease), stage IV ([place]) 07/27/2020   Essential hypertension 07/27/2020   CAP (community acquired pneumonia) 07/26/2020   Benign hypertensive kidney disease with chronic kidney disease 02/24/2019   Minimal change disease 02/24/2019   Proteinuria 02/24/2019   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   Type 2 diabetes mellitus with diabetic chronic kidney disease ([place]) 02/24/2019   Acute postoperative pain 08/02/2016   On home oxygen therapy 08/02/2016   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   Hypoglycemia 07/26/2016   Idiopathic hypotension 07/26/2016   Acute renal failure (ARF) ([place])    COPD exacerbation (HCC)    Respiratory failure ([place]) 07/05/2016   Complete tear of right rotator cuff 11/15/2015   Rotator cuff tendinitis, right 11/15/2015   ANA positive 07/12/2015   Arthralgia of multiple sites 07/12/2015   Accidental cut, puncture, perforation, or hemorrhage during heart catheterization 12/27/2012   Coronary artery disease 12/27/2012   Hyperlipidemia 12/27/2012   Recurrent nephrolithiasis 12/27/2012   Personal history of other malignant neoplasm of skin 04/08/2012    1. Obstructive sleep apnea On cpap therapy will continue with current pressures   2. Oxygen dependent His sats were belwo 88% down to 82% on RA will need to continue oxygen therapy - For home use only DME oxygen  3. SOB (shortness of breath/COPD He is at baseline will continue with current treatment plan   4. CPAP use counseling CPAP Counseling: had a lengthy discussion with the patient regarding the  importance of PAP therapy in management of the sleep apnea. Patient appears to understand the risk factor reduction and also understands the risks associated with untreated sleep apnea. Patient will try to make a good faith effort to remain compliant with therapy. Also instructed the patient on proper cleaning of the device including the water must be changed daily if possible and use of distilled water is preferred. Patient understands that the machine should be regularly cleaned with appropriate  recommended cleaning solutions that do not damage the PAP machine for example given white vinegar and water rinses. Other methods such as ozone treatment may not be as good as these simple methods to achieve cleaning.    General Counseling: I have discussed the findings of the evaluation and examination with Thayer Jew.  I have also discussed any further diagnostic evaluation thatmay be needed or ordered today. Alonso verbalizes understanding of the findings of todays visit. We also reviewed his medications today and discussed drug interactions and side effects including but not limited excessive drowsiness and altered mental states. We also discussed that there is always a risk not just to him but also people around him. he has been encouraged to call the office with any questions or concerns that should arise related to todays visit.  Orders Placed This Encounter  Procedures   For home use only DME oxygen    Pt uses oxygen on CPAP at night and continuous oxygen all day    Order Specific Question:   Length of Need    Answer:   Lifetime    Order Specific Question:   Mode or (Route)    Answer:   Nasal cannula    Order Specific Question:   Liters per Minute    Answer:   4    Order Specific Question:   Frequency    Answer:   Continuous (stationary and portable oxygen unit needed)    Order Specific Question:   Oxygen delivery system    Answer:   Gas     Time spent: 45  I have personally obtained a history,  examined the patient, evaluated laboratory and imaging results, formulated the assessment and plan and placed orders.    Allyne Gee, MD [facility] Pulmonary and Critical Care Sleep medicine

## 2021-11-01 NOTE — Patient Instructions (Signed)

## 2021-11-02 DIAGNOSIS — M47812 Spondylosis without myelopathy or radiculopathy, cervical region: Secondary | ICD-10-CM | POA: Diagnosis not present

## 2021-11-03 ENCOUNTER — Telehealth: Payer: Self-pay

## 2021-11-03 DIAGNOSIS — I351 Nonrheumatic aortic (valve) insufficiency: Secondary | ICD-10-CM | POA: Diagnosis not present

## 2021-11-03 DIAGNOSIS — I340 Nonrheumatic mitral (valve) insufficiency: Secondary | ICD-10-CM | POA: Diagnosis not present

## 2021-11-03 DIAGNOSIS — E782 Mixed hyperlipidemia: Secondary | ICD-10-CM | POA: Diagnosis not present

## 2021-11-03 DIAGNOSIS — I10 Essential (primary) hypertension: Secondary | ICD-10-CM | POA: Diagnosis not present

## 2021-11-03 DIAGNOSIS — G4739 Other sleep apnea: Secondary | ICD-10-CM | POA: Diagnosis not present

## 2021-11-03 DIAGNOSIS — J449 Chronic obstructive pulmonary disease, unspecified: Secondary | ICD-10-CM | POA: Diagnosis not present

## 2021-11-03 DIAGNOSIS — E663 Overweight: Secondary | ICD-10-CM | POA: Diagnosis not present

## 2021-11-03 DIAGNOSIS — E1169 Type 2 diabetes mellitus with other specified complication: Secondary | ICD-10-CM | POA: Diagnosis not present

## 2021-11-03 DIAGNOSIS — I2510 Atherosclerotic heart disease of native coronary artery without angina pectoris: Secondary | ICD-10-CM | POA: Diagnosis not present

## 2021-11-03 NOTE — Telephone Encounter (Signed)
Faxed equipment order for CPAP supplies with sleep study and titration to [facility] 11/03/21

## 2021-11-10 ENCOUNTER — Telehealth: Payer: Self-pay

## 2021-11-10 ENCOUNTER — Other Ambulatory Visit: Payer: Self-pay

## 2021-11-10 DIAGNOSIS — G4733 Obstructive sleep apnea (adult) (pediatric): Secondary | ICD-10-CM

## 2021-11-10 DIAGNOSIS — Z9981 Dependence on supplemental oxygen: Secondary | ICD-10-CM

## 2021-11-10 NOTE — Telephone Encounter (Signed)
[id number] from [facility] called that pt need order for evaluate for portable oxygen faxed order to [phone]

## 2021-11-21 DIAGNOSIS — E1122 Type 2 diabetes mellitus with diabetic chronic kidney disease: Secondary | ICD-10-CM | POA: Diagnosis not present

## 2021-11-21 DIAGNOSIS — I129 Hypertensive chronic kidney disease with stage 1 through stage 4 chronic kidney disease, or unspecified chronic kidney disease: Secondary | ICD-10-CM | POA: Diagnosis not present

## 2021-11-21 DIAGNOSIS — N184 Chronic kidney disease, stage 4 (severe): Secondary | ICD-10-CM | POA: Diagnosis not present

## 2021-11-22 DIAGNOSIS — D509 Iron deficiency anemia, unspecified: Secondary | ICD-10-CM | POA: Diagnosis not present

## 2021-11-22 DIAGNOSIS — R5383 Other fatigue: Secondary | ICD-10-CM | POA: Diagnosis not present

## 2021-11-22 DIAGNOSIS — E559 Vitamin D deficiency, unspecified: Secondary | ICD-10-CM | POA: Diagnosis not present

## 2021-11-22 DIAGNOSIS — M109 Gout, unspecified: Secondary | ICD-10-CM | POA: Diagnosis not present

## 2021-11-22 DIAGNOSIS — I10 Essential (primary) hypertension: Secondary | ICD-10-CM | POA: Diagnosis not present

## 2021-11-22 DIAGNOSIS — J449 Chronic obstructive pulmonary disease, unspecified: Secondary | ICD-10-CM | POA: Diagnosis not present

## 2021-11-22 DIAGNOSIS — D519 Vitamin B12 deficiency anemia, unspecified: Secondary | ICD-10-CM | POA: Diagnosis not present

## 2021-11-22 DIAGNOSIS — E1169 Type 2 diabetes mellitus with other specified complication: Secondary | ICD-10-CM | POA: Diagnosis not present

## 2021-11-22 LAB — VITAMIN B12: Vitamin B-12: 638

## 2021-11-22 LAB — VITAMIN D 25 HYDROXY (VIT D DEFICIENCY, FRACTURES): Vit D, 25-Hydroxy: 44.6

## 2021-11-24 DIAGNOSIS — E875 Hyperkalemia: Secondary | ICD-10-CM | POA: Diagnosis not present

## 2021-11-24 DIAGNOSIS — F411 Generalized anxiety disorder: Secondary | ICD-10-CM | POA: Diagnosis not present

## 2021-11-24 DIAGNOSIS — E1169 Type 2 diabetes mellitus with other specified complication: Secondary | ICD-10-CM | POA: Diagnosis not present

## 2021-11-24 DIAGNOSIS — I10 Essential (primary) hypertension: Secondary | ICD-10-CM | POA: Diagnosis not present

## 2021-11-24 DIAGNOSIS — N184 Chronic kidney disease, stage 4 (severe): Secondary | ICD-10-CM | POA: Diagnosis not present

## 2021-11-24 DIAGNOSIS — E782 Mixed hyperlipidemia: Secondary | ICD-10-CM | POA: Diagnosis not present

## 2021-11-24 DIAGNOSIS — N3941 Urge incontinence: Secondary | ICD-10-CM | POA: Diagnosis not present

## 2021-11-24 DIAGNOSIS — E663 Overweight: Secondary | ICD-10-CM | POA: Diagnosis not present

## 2021-11-25 DIAGNOSIS — L603 Nail dystrophy: Secondary | ICD-10-CM | POA: Diagnosis not present

## 2021-11-25 DIAGNOSIS — L851 Acquired keratosis [keratoderma] palmaris et plantaris: Secondary | ICD-10-CM | POA: Diagnosis not present

## 2021-11-25 DIAGNOSIS — E1142 Type 2 diabetes mellitus with diabetic polyneuropathy: Secondary | ICD-10-CM | POA: Diagnosis not present

## 2021-11-28 ENCOUNTER — Encounter: Payer: Self-pay | Admitting: Nurse Practitioner

## 2021-11-29 ENCOUNTER — Other Ambulatory Visit: Payer: Self-pay

## 2021-11-29 ENCOUNTER — Telehealth: Payer: Self-pay

## 2021-11-29 DIAGNOSIS — E875 Hyperkalemia: Secondary | ICD-10-CM | POA: Diagnosis not present

## 2021-11-29 NOTE — Telephone Encounter (Signed)
Pt is not happy with the move to [facility] for his oxygen and CPAP supplies due to insurance not having [facility] in network for him to continue using.  Spoke with Darlina Guys with [employer] and Denia the branch manager and they are checking with their supplier to have filled oxygen bottles to be delivered to patient.  She will find out the information and will get back in touch with me ASAP

## 2021-11-29 NOTE — Patient Outreach (Signed)
[name] [name] [facility]) Care Management  11/29/2021  Ravindra Baranek [facility] February 03, 1946 [id number]   Telephone Screen    Outreach call to patient to introduce [facility] services and assess care needs as part of benefit of PCP office and insurance plan. Spoke with patient.    Main healthcare issue/concern today: Patient reports he does not have enough oxygen tanks in the home.He has only been approved for two tanks by insurance. He has refillable tanks and has to constantly keep refilling them and voices how exhausting that it for me. He does not like to use large concentrator tank as he is not able to be mobile and it confines him. Asked patient if he had discussed this with MDs and he reports he has but no resolution of issue. Encouraged patient to discuss with them again to see what his oxygen options are in the home. RN CM will [place] route encounter note to MD to make them aware of patient's concern.    Health Maintenance/Care Gaps Addressed & Education Provided:   -Last FIE:[id number] pt. on importance of completing   Conditions: Per chart review, patient has PMH that includes but not limited to OSA, cervical spondylosis, CKD, HTN, anemia and COPD.     Medications Reviewed Today     Reviewed by Hayden Pedro, RN (Registered Nurse) on 11/29/21 at 1143  Med List Status: <None>   Medication Order Taking? Sig Documenting Provider Last Dose Status Informant  acetaminophen (TYLENOL) 650 MG CR tablet [id number] No Take 1,300 mg by mouth every 8 (eight) hours as needed for pain. [provider] Taking Active Spouse/Significant Other  albuterol (VENTOLIN HFA) 108 (90 Base) MCG/ACT inhaler [id number] No Inhale 2 puffs into the lungs every 6 (six) hours as needed for wheezing or shortness of breath. [provider] Taking Active Spouse/Significant Other  Ascorbic Acid (VITAMIN C) 1000 MG tablet [id number] No Take 1,000 mg by mouth at bedtime. [provider] Taking Active Spouse/Significant Other  aspirin EC 81 MG EC tablet [id number] No Take 1 tablet (81 mg total) by mouth daily.  Patient not taking: Reported on 10/10/2021   Teressa Lower, NP Not Taking Active Spouse/Significant Other  Cholecalciferol (VITAMIN D) 50 MCG (2000 UT) tablet [id number] No Take 2,000 Units by mouth daily.  Patient not taking: Reported on 10/10/2021   [provider] Not Taking Active Spouse/Significant Other  citalopram (CELEXA) 40 MG tablet [id number] No Take 40 mg by mouth every morning. [provider] Taking Active Spouse/Significant Other  Evolocumab (REPATHA SURECLICK) 703 MG/ML SOAJ [id number] No Inject 140 mg into the skin every 14 (fourteen) days. [provider] Taking Active Spouse/Significant Other           Med Note Nat Christen   Tue Nov 23, 2020  8:58 AM) Next injection due on 12/01/20  ezetimibe (ZETIA) 10 MG tablet [id number] No Take 10 mg by mouth at bedtime. [provider] Taking Active Spouse/Significant Other  fexofenadine (ALLEGRA) 180 MG tablet [id number] No Take 180 mg by mouth daily as needed for allergies or rhinitis. [provider] Taking Active Spouse/Significant Other  fluticasone (FLONASE) 50 MCG/ACT nasal spray [id number] No Place 2 sprays into both nostrils daily.  Patient taking differently: Place 2 sprays into both nostrils daily as needed for allergies.   Teressa Lower, NP Taking Active   fluticasone-salmeterol [facility]) 824-23 MCG/ACT inhaler [id number] No Inhale 2 puffs into the lungs 2 (two) times daily as needed (shortness of breath). Humphrey Rolls, Lanice Schwab  A, MD Taking Active   furosemide (LASIX) 20 MG tablet [id number] No Take 20 mg by mouth every morning. [provider] Taking Active Spouse/Significant Other  gabapentin (NEURONTIN) 100 MG capsule [id number] No Take 2 capsules (200 mg total) by mouth 2 (two) times daily.  Patient taking differently: Take 200 mg by mouth in  the morning.   Teressa Lower, NP Taking Active            Med Note Nat Christen   Tue Nov 23, 2020  8:56 AM)    gabapentin (NEURONTIN) 400 MG capsule [id number] No Take 400 mg by mouth at bedtime. [provider] Taking Active Spouse/Significant Other           Med Note Nat Christen   Tue Nov 23, 2020  9:00 AM)    ipratropium-albuterol (DUONEB) 0.5-2.5 (3) MG/3ML nebulizer solution 3 mL [id number]   Allyne Gee, MD  Active   ipratropium-albuterol (DUONEB) 0.5-2.5 (3) MG/3ML SOLN [id number] No Take 3 mLs by nebulization every 4 (four) hours as needed. Allyne Gee, MD Taking Active   losartan (COZAAR) 50 MG tablet [id number] No  [provider] Taking Active   methocarbamol (ROBAXIN) 750 MG tablet [id number] No Take 750 mg by mouth 2 (two) times daily as needed for muscle spasms. [provider] Taking Active Spouse/Significant Other  metoprolol succinate (TOPROL-XL) 50 MG 24 hr tablet [id number] No Take 50 mg by mouth every morning. Take with or immediately following a meal. [provider] Taking Active Spouse/Significant Other  NON FORMULARY [id number] No cpap w/o2 [provider] Taking Active Spouse/Significant Other  Omega-3 Fatty Acids (OMEGA-3 FISH OIL PO) [id number] No Take 1 tablet by mouth daily.  Patient not taking: Reported on 10/10/2021   [provider] Not Taking Active   OXYGEN [id number] No Inhale 3 L into the lungs See admin instructions. Used as needed throughout the day and continuous at bedtime [provider] Taking Active Spouse/Significant Other  pantoprazole (PROTONIX) 40 MG tablet [id number] No Take 40 mg by mouth every morning. [provider] Taking Active Spouse/Significant Other  Probiotic Product (PROBIOTIC PO) [id number] No Take 1 capsule by mouth daily.  Patient not taking: Reported on 10/10/2021   [provider] Not Taking Active Spouse/Significant Other  rosuvastatin (CRESTOR) 5  MG tablet [id number] No Take 5 mg by mouth daily. [provider] Taking Active   Simethicone (GAS-X PO) [id number] No Take 1 tablet by mouth 2 (two) times daily as needed (flatulence). [provider] Taking Active Spouse/Significant Other  sodium chloride (OCEAN) 0.65 % SOLN nasal spray [id number] No Place 1 spray into both nostrils as needed for congestion. Teressa Lower, NP Taking Active   SUPER B COMPLEX/C PO [id number] No Take 1 tablet by mouth daily. [provider] Taking Active Spouse/Significant Other  tamsulosin (FLOMAX) 0.4 MG CAPS capsule [id number] No Take 1 capsule (0.4 mg total) by mouth daily. Festus Aloe, MD Taking Active   traMADol Veatrice Bourbon) 50 MG tablet [id number] No Take 1 tablet (50 mg total) by mouth every 6 (six) hours as needed for moderate pain.  Patient not taking: Reported on 10/10/2021   Corky Mull, MD Not Taking Active                07/30/2020   11:00 AM 12/02/2020    7:55 AM 10/10/2021    1:34 PM 10/24/2021    1:55 PM 11/29/2021   11:43 AM  Fall Risk  Falls in the past year?     0  Was there an injury with Fall?     0  Fall Risk Category Calculator     0  Fall Risk Category     Low  Patient Fall Risk Level Moderate fall risk High fall risk Low fall risk Low fall risk Moderate fall risk  Patient at Risk for Falls Due to     Medication side effect  Fall risk Follow up     Falls evaluation completed;Education provided    SDOH Screenings   Alcohol Screen: Not on file  Depression (PHQ2-9): Low Risk  (11/29/2021)   Depression (PHQ2-9)    PHQ-2 Score: 0  Financial Resource Strain: Not on file  Food Insecurity: No Food Insecurity (11/29/2021)   Hunger Vital Sign    Worried About Running Out of Food in the Last Year: Never true    Ran Out of Food in the Last Year: Never true  Housing: Not on file  Physical Activity: Not on file  Social Connections: Not on file  Stress: Not on file  Tobacco Use: Medium Risk (11/01/2021)   Patient  History    Smoking Tobacco Use: Former    Smokeless Tobacco Use: Former    Passive Exposure: Not on Pensions consultant Needs: No Transportation Needs (11/29/2021)   PRAPARE - Transportation    Lack of Transportation (Medical): No    Lack of Transportation (Non-Medical): No       11/29/2021   11:38 AM 06/02/2019   11:18 AM 10/01/2017   10:16 AM 01/02/2017    9:55 AM 10/02/2016    1:41 PM  Depression screen PHQ 2/9  Decreased Interest 0 0 0 0 0  Down, Depressed, Hopeless 0 0 0 0 0  PHQ - 2 Score 0 0 0 0 0  Altered sleeping    2 0  Tired, decreased energy    0 0  Change in appetite    0 0  Feeling bad or failure about yourself     0 0  Trouble concentrating    0 0  Moving slowly or fidgety/restless    0 0  Suicidal thoughts    0 0  PHQ-9 Score    2 0    Care Plan : RN Care Manager POC  Updates made by Hayden Pedro, RN since 11/29/2021 12:00 AM     Problem: Chronic Disease Mgmt of Chronic Condition-COPD   Priority: High     Long-Range Goal: Chronic Disease Mgmt of Chronic Condition-COPD   Start Date: 11/29/2021  Expected End Date: 11/30/2022  Priority: High  Note:     Current Barriers:  Chronic Disease Management support and education needs related to COPD   RNCM Clinical Goal(s):  Patient will verbalize understanding of plan for management of COPD as evidenced by mgmt of chronic conditions continue to work with RN Care Manager to address care management and care coordination needs related to  COPD as evidenced by adherence to CM Team Scheduled appointments through collaboration with RN Care manager, provider, and care team.   Interventions: 1:1 collaboration with primary care provider regarding development and update of comprehensive plan of care as evidenced by provider attestation and co-signature Inter-disciplinary care team collaboration (see longitudinal plan of care) Evaluation of current treatment plan related to  self management and patient's adherence to  plan as established by provider   COPD Interventions:  (Status:  New goal.) Long Term Goal Advised patient to self assesses COPD  action plan zone and make appointment with provider if in the yellow zone for 48 hours without improvement Discussed the importance of adequate rest and management of fatigue with COPD  Patient Goals/Self-Care Activities: Take all medications as prescribed Attend all scheduled provider appointments Call provider office for new concerns or questions  follow rescue plan if symptoms flare-up  Follow Up Plan:  Telephone follow up appointment with care management team member scheduled for:  within the month of Sept The patient has been provided with contact information for the care management team and has been advised to call with any health related questions or concerns.        Consent:[facility] services reviewed and discussed with patient. Verbal consent for services given.   Plan: RN CM discussed with patient next outreach within the month of Sept. Patient agrees to care plan and follow up. RN CM will send barriers letter and route encounter to PCP. RN CM will send welcome letter to patient.  Enzo Montgomery, RN,BSN,CCM [place] Management Telephonic Care Management Coordinator Direct Phone: [phone] Toll Free: [phone] Fax: [phone]

## 2021-11-30 ENCOUNTER — Telehealth: Payer: Self-pay | Admitting: *Deleted

## 2021-11-30 NOTE — Telephone Encounter (Signed)
[facility] sent labs from 11/22/2021. Dr. Janese Banks wanted to see labs in 3 months which would be October. Based on the hemoglobin and plt. They are still ok and nothing else is required at this time.

## 2021-12-22 ENCOUNTER — Other Ambulatory Visit: Payer: Self-pay

## 2021-12-22 NOTE — Patient Outreach (Signed)
[facility]) Care Management  12/22/2021  Kwali [facility] 08-03-1945 [id number]   Case Transfer  Pt will be followed for care coordination by [place] Management. Assigned RN CM will outreach to patient.    Enzo Montgomery, RN,BSN,CCM [place] Management Telephonic Care Management Coordinator Direct Phone: [phone] Toll Free: [phone] Fax: [phone]

## 2021-12-28 DIAGNOSIS — Z0100 Encounter for examination of eyes and vision without abnormal findings: Secondary | ICD-10-CM | POA: Diagnosis not present

## 2021-12-28 DIAGNOSIS — H353221 Exudative age-related macular degeneration, left eye, with active choroidal neovascularization: Secondary | ICD-10-CM | POA: Diagnosis not present

## 2021-12-28 LAB — HM DIABETES EYE EXAM

## 2022-01-04 DIAGNOSIS — N050 Unspecified nephritic syndrome with minor glomerular abnormality: Secondary | ICD-10-CM | POA: Diagnosis not present

## 2022-01-04 DIAGNOSIS — N184 Chronic kidney disease, stage 4 (severe): Secondary | ICD-10-CM | POA: Diagnosis not present

## 2022-01-04 DIAGNOSIS — N2581 Secondary hyperparathyroidism of renal origin: Secondary | ICD-10-CM | POA: Diagnosis not present

## 2022-01-04 DIAGNOSIS — D631 Anemia in chronic kidney disease: Secondary | ICD-10-CM | POA: Diagnosis not present

## 2022-01-04 DIAGNOSIS — I10 Essential (primary) hypertension: Secondary | ICD-10-CM | POA: Diagnosis not present

## 2022-01-04 DIAGNOSIS — E1122 Type 2 diabetes mellitus with diabetic chronic kidney disease: Secondary | ICD-10-CM | POA: Diagnosis not present

## 2022-01-09 DIAGNOSIS — E782 Mixed hyperlipidemia: Secondary | ICD-10-CM | POA: Diagnosis not present

## 2022-01-09 DIAGNOSIS — E1169 Type 2 diabetes mellitus with other specified complication: Secondary | ICD-10-CM | POA: Diagnosis not present

## 2022-01-09 DIAGNOSIS — E663 Overweight: Secondary | ICD-10-CM | POA: Diagnosis not present

## 2022-01-09 DIAGNOSIS — L84 Corns and callosities: Secondary | ICD-10-CM | POA: Diagnosis not present

## 2022-01-11 ENCOUNTER — Ambulatory Visit: Payer: Medicare HMO | Admitting: Internal Medicine

## 2022-01-11 DIAGNOSIS — N050 Unspecified nephritic syndrome with minor glomerular abnormality: Secondary | ICD-10-CM | POA: Diagnosis not present

## 2022-01-11 DIAGNOSIS — N2581 Secondary hyperparathyroidism of renal origin: Secondary | ICD-10-CM | POA: Diagnosis not present

## 2022-01-11 DIAGNOSIS — N184 Chronic kidney disease, stage 4 (severe): Secondary | ICD-10-CM | POA: Diagnosis not present

## 2022-01-11 DIAGNOSIS — I10 Essential (primary) hypertension: Secondary | ICD-10-CM | POA: Diagnosis not present

## 2022-01-11 DIAGNOSIS — D631 Anemia in chronic kidney disease: Secondary | ICD-10-CM | POA: Diagnosis not present

## 2022-01-11 DIAGNOSIS — E1122 Type 2 diabetes mellitus with diabetic chronic kidney disease: Secondary | ICD-10-CM | POA: Diagnosis not present

## 2022-01-26 ENCOUNTER — Ambulatory Visit: Payer: Medicare HMO | Admitting: Internal Medicine

## 2022-01-30 DIAGNOSIS — E119 Type 2 diabetes mellitus without complications: Secondary | ICD-10-CM | POA: Diagnosis not present

## 2022-02-03 ENCOUNTER — Ambulatory Visit
Admission: RE | Admit: 2022-02-03 | Discharge: 2022-02-03 | Disposition: A | Discharge status: Home / Self Care | Payer: Medicare HMO | Source: Ambulatory Visit | Attending: Nurse Practitioner | Admitting: Nurse Practitioner

## 2022-02-03 ENCOUNTER — Ambulatory Visit
Admission: RE | Admit: 2022-02-03 | Discharge: 2022-02-03 | Disposition: A | Discharge status: Home / Self Care | Payer: Medicare HMO | Attending: Family | Admitting: Family

## 2022-02-03 ENCOUNTER — Other Ambulatory Visit: Payer: Self-pay | Admitting: Nurse Practitioner

## 2022-02-03 DIAGNOSIS — E1169 Type 2 diabetes mellitus with other specified complication: Secondary | ICD-10-CM | POA: Diagnosis not present

## 2022-02-03 DIAGNOSIS — I2510 Atherosclerotic heart disease of native coronary artery without angina pectoris: Secondary | ICD-10-CM | POA: Diagnosis not present

## 2022-02-03 DIAGNOSIS — E663 Overweight: Secondary | ICD-10-CM | POA: Diagnosis not present

## 2022-02-03 DIAGNOSIS — I10 Essential (primary) hypertension: Secondary | ICD-10-CM | POA: Diagnosis not present

## 2022-02-03 DIAGNOSIS — R0600 Dyspnea, unspecified: Secondary | ICD-10-CM | POA: Diagnosis not present

## 2022-02-03 DIAGNOSIS — E782 Mixed hyperlipidemia: Secondary | ICD-10-CM | POA: Diagnosis not present

## 2022-02-03 DIAGNOSIS — I340 Nonrheumatic mitral (valve) insufficiency: Secondary | ICD-10-CM | POA: Diagnosis not present

## 2022-02-03 DIAGNOSIS — I351 Nonrheumatic aortic (valve) insufficiency: Secondary | ICD-10-CM | POA: Diagnosis not present

## 2022-02-03 DIAGNOSIS — R0602 Shortness of breath: Secondary | ICD-10-CM | POA: Diagnosis not present

## 2022-02-03 DIAGNOSIS — R059 Cough, unspecified: Secondary | ICD-10-CM | POA: Diagnosis not present

## 2022-02-03 DIAGNOSIS — G4739 Other sleep apnea: Secondary | ICD-10-CM | POA: Diagnosis not present

## 2022-02-13 DIAGNOSIS — H353221 Exudative age-related macular degeneration, left eye, with active choroidal neovascularization: Secondary | ICD-10-CM | POA: Diagnosis not present

## 2022-02-13 DIAGNOSIS — H353211 Exudative age-related macular degeneration, right eye, with active choroidal neovascularization: Secondary | ICD-10-CM | POA: Diagnosis not present

## 2022-02-14 ENCOUNTER — Ambulatory Visit: Payer: Self-pay

## 2022-02-14 NOTE — Patient Instructions (Signed)
Visit Information  Thank you for taking time to visit with me today. Please don't hesitate to contact me if I can be of assistance to you.   Following are the goals we discussed today:   Goals Addressed             This Visit's Progress    Patient Stated:  Getting connected with new pulmonary provider and understand new treatment plan       Care Coordination Interventions: Provided patient with basic written and verbal COPD education on self care/management/and exacerbation prevention Reviewed scheduled / upcoming appointments.  Evaluation of current treatment plan related to COPD and patient's adherence to plan as established by provider Discussed plans with patient for ongoing care management follow up and provided patient with direct contact information for care management team            Our next appointment is by telephone on 03/22/22 at 11:30 am  Please call the care guide team at [phone] if you need to cancel or reschedule your appointment.   If you are experiencing a Mental Health or [place] or need someone to talk to, please call 1-800-273-TALK (toll free, 24 hour hotline)  The patient verbalized understanding of instructions, educational materials, and care plan provided today and agreed to receive a mailed copy of patient instructions, educational materials, and care plan.   Quinn Plowman RN,BSN,CCM [facility] Care Coordination [phone] direct line  COPD Action Plan A COPD action plan is a description of what to do when you have a flare (exacerbation) of chronic obstructive pulmonary disease (COPD). Your action plan is a color-coded plan that lists the symptoms that indicate whether your condition is under control and what actions to take. If you have symptoms in the [place] zone, it means you are doing well that day. If you have symptoms in the yellow zone, it means you are having a bad day or an exacerbation. If you have symptoms in the red  zone, you need urgent medical care. Follow the plan that you and your health care provider developed. Review your plan with your health care provider at each visit. Red zone Symptoms in this zone mean that you should get medical help right away. They include: Feeling very short of breath, even when you are resting. Not being able to do any activities because of poor breathing. Not being able to sleep because of poor breathing. Fever or shaking chills. Feeling confused or very sleepy. Chest pain. Coughing up blood. If you have any of these symptoms, call emergency services (911 in the [place] or go to the nearest emergency room. Yellow zone Symptoms in this zone mean that your condition may be getting worse. They include: Feeling more short of breath than usual. Having less energy for daily activities than usual. Phlegm or mucus that is thicker than usual. Needing to use your rescue inhaler or nebulizer more often than usual. More ankle swelling than usual. Coughing more than usual. Feeling like you have a chest cold. Trouble sleeping due to COPD symptoms. Decreased appetite. COPD medicines not helping as much as usual. If you experience any "yellow" symptoms: Keep taking your daily medicines as directed. Use your quick-relief inhaler as told by your health care provider. If you were prescribed steroid medicine to take by mouth (oral medicine), start taking it as told by your health care provider. If you were prescribed an antibiotic medicine, start taking it as told by your health care provider. Do not stop taking  the antibiotic even if you start to feel better. Use oxygen as told by your health care provider. Get more rest. Do your pursed-lip breathing exercises. Do not smoke. Avoid any irritants in the air. If your signs and symptoms do not improve after taking these steps, call your health care provider right away. Kyran Connaughton zone Symptoms in this zone mean that you are doing well.  They include: Being able to do your usual activities and exercise. Having the usual amount of coughing, including the same amount of phlegm or mucus. Being able to sleep well. Having a good appetite. Where to find more information: You can find more information about COPD from: American Lung Association, My COPD Action Plan: www.lung.org COPD Foundation: www.copdfoundation.[place]: https://wilson-eaton.com/ Follow these instructions at home: Continue taking your daily medicines as told by your health care provider. Make sure you receive all the immunizations that your health care provider recommends, especially the pneumococcal and influenza vaccines. Wash your hands often with soap and water. Have family members wash their hands too. Regular hand washing can help prevent infections. Follow your usual exercise and diet plan. Avoid irritants in the air, such as smoke. Do not use any products that contain nicotine or tobacco. These products include cigarettes, chewing tobacco, and vaping devices, such as e-cigarettes. If you need help quitting, ask your health care provider. Summary A COPD action plan tells you what to do when you have a flare (exacerbation) of chronic obstructive pulmonary disease (COPD). Follow each action plan for your symptoms. If you have any symptoms in the red zone, call emergency services (911 in the [place] or go to the nearest emergency room. This information is not intended to replace advice given to you by your health care provider. Make sure you discuss any questions you have with your health care provider. Document Revised: 02/17/2020 Document Reviewed: 02/17/2020 Elsevier Patient Education  [place].

## 2022-02-14 NOTE — Patient Outreach (Signed)
  Care Coordination   Follow Up Visit Note   02/14/2022 Name: [name] [name] MRN: [id number] DOB: 03-31-1946  [name] [name] is a 76 y.o. year old male who sees Perrin Maltese, MD for primary care. I spoke with  [employer]ber by phone today.  What matters to the patients health and wellness today?  Patient states he is changing pulmonary providers.  He states he is scheduled for his new consult in November 2023.  Patient states he would like to have RNCM follow up with him to make sure he gets in with his new provider and understands his new treatment plan.  Patient reports wearing Oxygen at 3 L around the clock.     Goals Addressed             This Visit's Progress    Patient Stated:  Getting connected with new pulmonary provider and understand new treatment plan       Care Coordination Interventions: Provided patient with basic written and verbal COPD education on self care/management/and exacerbation prevention Reviewed scheduled / upcoming appointments.  Evaluation of current treatment plan related to COPD and patient's adherence to plan as established by provider Discussed plans with patient for ongoing care management follow up and provided patient with direct contact information for care management team            SDOH assessments and interventions completed:  Yes  SDOH Interventions Today    Flowsheet Row Most Recent Value  SDOH Interventions   Food Insecurity Interventions Intervention Not Indicated  Housing Interventions Intervention Not Indicated  Transportation Interventions Intervention Not Indicated        Care Coordination Interventions Activated:  Yes  Care Coordination Interventions:  Yes, provided   Follow up plan: Follow up call scheduled for 03/22/22 at 11:30 am    Encounter Outcome:  Pt. Visit Completed   Quinn Plowman RN,BSN,CCM [place] [phone] direct line

## 2022-02-22 DIAGNOSIS — E1169 Type 2 diabetes mellitus with other specified complication: Secondary | ICD-10-CM | POA: Diagnosis not present

## 2022-02-22 DIAGNOSIS — E039 Hypothyroidism, unspecified: Secondary | ICD-10-CM | POA: Diagnosis not present

## 2022-02-22 DIAGNOSIS — R799 Abnormal finding of blood chemistry, unspecified: Secondary | ICD-10-CM | POA: Diagnosis not present

## 2022-02-22 DIAGNOSIS — E782 Mixed hyperlipidemia: Secondary | ICD-10-CM | POA: Diagnosis not present

## 2022-02-22 DIAGNOSIS — I10 Essential (primary) hypertension: Secondary | ICD-10-CM | POA: Diagnosis not present

## 2022-02-22 LAB — BASIC METABOLIC PANEL
BUN: 35 — AB (ref 4–21)
CO2: 26 — AB (ref 13–22)
Chloride: 105 (ref 99–108)
Creatinine: 2.8 — AB (ref 0.6–1.3)
Glucose: 100
Potassium: 5.2 mEq/L — AB (ref 3.5–5.1)
Sodium: 147 (ref 137–147)

## 2022-02-22 LAB — LIPID PANEL
Cholesterol: 100 (ref 0–200)
HDL: 39 (ref 35–70)
LDL Cholesterol: 38

## 2022-02-22 LAB — COMPREHENSIVE METABOLIC PANEL
Albumin: 3.6 (ref 3.5–5.0)
Calcium: 8.9 (ref 8.7–10.7)
Globulin: 3
eGFR: 23

## 2022-02-22 LAB — CBC AND DIFFERENTIAL
HCT: 31 — AB (ref 41–53)
Hemoglobin: 10.6 — AB (ref 13.5–17.5)
Neutrophils Absolute: 3.6
Platelets: 132 10*3/uL — AB (ref 150–400)
WBC: 5.8

## 2022-02-22 LAB — HEPATIC FUNCTION PANEL
ALT: 5 U/L — AB (ref 10–40)
AST: 15 (ref 14–40)
Alkaline Phosphatase: 81 (ref 25–125)
Bilirubin, Total: 0.3

## 2022-02-22 LAB — HEMOGLOBIN A1C: Hemoglobin A1C: 5

## 2022-02-22 LAB — IRON,TIBC AND FERRITIN PANEL
%SAT: 25
Ferritin: 138
Iron: 56
TIBC: 226
UIBC: 170

## 2022-02-22 LAB — TSH: TSH: 2.88 (ref 0.41–5.90)

## 2022-02-22 LAB — CBC: RBC: 3.28 — AB (ref 3.87–5.11)

## 2022-02-24 DIAGNOSIS — E663 Overweight: Secondary | ICD-10-CM | POA: Diagnosis not present

## 2022-02-24 DIAGNOSIS — Z0000 Encounter for general adult medical examination without abnormal findings: Secondary | ICD-10-CM | POA: Diagnosis not present

## 2022-02-24 DIAGNOSIS — Z6832 Body mass index (BMI) 32.0-32.9, adult: Secondary | ICD-10-CM | POA: Diagnosis not present

## 2022-02-24 DIAGNOSIS — Z23 Encounter for immunization: Secondary | ICD-10-CM | POA: Diagnosis not present

## 2022-02-24 DIAGNOSIS — I10 Essential (primary) hypertension: Secondary | ICD-10-CM | POA: Diagnosis not present

## 2022-02-24 DIAGNOSIS — E1169 Type 2 diabetes mellitus with other specified complication: Secondary | ICD-10-CM | POA: Diagnosis not present

## 2022-02-24 DIAGNOSIS — Z0001 Encounter for general adult medical examination with abnormal findings: Secondary | ICD-10-CM | POA: Diagnosis not present

## 2022-02-24 DIAGNOSIS — E782 Mixed hyperlipidemia: Secondary | ICD-10-CM | POA: Diagnosis not present

## 2022-02-24 DIAGNOSIS — Z739 Problem related to life management difficulty, unspecified: Secondary | ICD-10-CM | POA: Diagnosis not present

## 2022-02-24 LAB — HM AWV

## 2022-03-08 DIAGNOSIS — B351 Tinea unguium: Secondary | ICD-10-CM | POA: Diagnosis not present

## 2022-03-08 DIAGNOSIS — E1142 Type 2 diabetes mellitus with diabetic polyneuropathy: Secondary | ICD-10-CM | POA: Diagnosis not present

## 2022-03-13 ENCOUNTER — Ambulatory Visit: Payer: Medicare HMO | Admitting: Dermatology

## 2022-03-13 DIAGNOSIS — Z85828 Personal history of other malignant neoplasm of skin: Secondary | ICD-10-CM | POA: Diagnosis not present

## 2022-03-13 DIAGNOSIS — L578 Other skin changes due to chronic exposure to nonionizing radiation: Secondary | ICD-10-CM

## 2022-03-13 DIAGNOSIS — L219 Seborrheic dermatitis, unspecified: Secondary | ICD-10-CM | POA: Diagnosis not present

## 2022-03-13 DIAGNOSIS — L570 Actinic keratosis: Secondary | ICD-10-CM | POA: Diagnosis not present

## 2022-03-13 MED ORDER — KETOCONAZOLE 2 % EX CREA: TOPICAL_CREAM | CUTANEOUS | 2 refills | Status: DC

## 2022-03-13 NOTE — Progress Notes (Signed)
Follow-Up Visit   Subjective  [name] [name] is a 76 y.o. male who presents for the following: Follow-up (Patient here today to check sun exposed areas. Patient has hx of AK's, BCC and SCC. ).  Patient accompanied by wife who contributes to history.  The following portions of the chart were reviewed this encounter and updated as appropriate:       Review of Systems:  No other skin or systemic complaints except as noted in HPI or Assessment and Plan.  Objective  Well appearing patient in no apparent distress; mood and affect are within normal limits.  A full examination was performed including scalp, head, eyes, ears, nose, lips, neck, chest, axillae, abdomen, back, buttocks, bilateral upper extremities, bilateral lower extremities, hands, feet, fingers, toes, fingernails, and toenails. All findings within normal limits unless otherwise noted below.  eyebrows, scalp Pink patches with greasy scale.   R frontal scalp x 3, inferior vertex x 3, L antihelix x 2, L frontal scalp x 3, L forearm x 3 (14) Keratotic macules and papules    Assessment & Plan  Seborrheic dermatitis eyebrows, scalp  Chronic and persistent condition with duration or expected duration over one year. Condition is symptomatic/ bothersome to patient. Not currently at goal.   Seborrheic Dermatitis  -  is a chronic persistent rash characterized by pinkness and scaling most commonly of the mid face but also can occur on the scalp (dandruff), ears; mid chest, mid back and groin.  It tends to be exacerbated by stress and cooler weather.  People who have neurologic disease may experience new onset or exacerbation of existing seborrheic dermatitis.  The condition is not curable but treatable and can be controlled.  Recommend T-Sal shampoo a few times weekly. Massage into scalp and let sit a few minutes before rinsing.  Start ketoconazole 2% cream 1-2 times daily as needed to scalp, ears, face as needed.    ketoconazole (NIZORAL) 2 % cream - eyebrows, scalp Apply 1-2 times daily to face, scalp and ears as needed.  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 They are chronic with expected duration over 1 year. A portion of actinic keratoses will progress to squamous cell carcinoma of the skin. It is not possible to reliably predict which spots will progress to skin cancer and so treatment is recommended to prevent development of skin cancer.  Recommend daily broad spectrum sunscreen SPF 30+ to sun-exposed areas, reapply every 2 hours as needed.  Recommend staying in the shade or wearing long sleeves, sun glasses (UVA+UVB protection) and wide brim hats (4-inch brim around the entire circumference of the hat). Call for new or changing lesions.    Destruction of lesion - R frontal scalp x 3, inferior vertex x 3, L antihelix x 2, L frontal scalp x 3, L forearm x 3  Destruction method: cryotherapy   Informed consent: discussed and consent obtained   Lesion destroyed using liquid nitrogen: Yes   Region frozen until ice ball extended beyond lesion: Yes   Outcome: patient tolerated procedure well with no complications   Post-procedure details: wound care instructions given   Additional details:  Prior to procedure, discussed risks of blister formation, small wound, skin dyspigmentation, or rare scar following cryotherapy. Recommend Vaseline ointment to treated areas  while healing.    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  - Call if any new or changing lesions are noted between office visits  Actinic Damage - chronic, secondary to cumulative UV radiation exposure/sun exposure over time - diffuse scaly erythematous macules with underlying dyspigmentation - Recommend daily broad spectrum sunscreen SPF 30+ to sun-exposed areas, reapply every 2 hours as needed.  - Recommend staying in the shade or wearing long sleeves, sun glasses (UVA+UVB protection) and wide brim hats (4-inch brim around the entire circumference of the hat). - Call for new or changing lesions.  Return in about 6 months (around 09/11/2022) for UBSE, Hx BCC, Hx SCC, Hx AK.  Graciella Belton, RMA, am acting as scribe for Brendolyn Patty, MD .  Documentation: I have reviewed the above documentation for accuracy and completeness, and I agree with the above.  Brendolyn Patty MD

## 2022-03-13 NOTE — Patient Instructions (Addendum)
Seborrheic Dermatitis  -  is a chronic persistent rash characterized by pinkness and scaling most commonly of the mid face but also can occur on the scalp (dandruff), ears; mid chest, mid back and groin.  It tends to be exacerbated by stress and cooler weather.  People who have neurologic disease may experience new onset or exacerbation of existing seborrheic dermatitis.  The condition is not curable but treatable and can be controlled.  Recommend T-Sal shampoo a few times weekly. Massage into scalp and let sit a few minutes before rinsing.  Start ketoconazole 2% cream 1-2 times daily as needed to scalp, ears, face as needed.    Cryotherapy Aftercare  Wash gently with soap and water everyday.   Apply Vaseline and Band-Aid daily until healed.    Due to recent changes in healthcare laws, you may see results of your pathology and/or laboratory studies on MyChart before the doctors have had a chance to review them. We understand that in some cases there may be results that are confusing or concerning to you. Please understand that not all results are received at the same time and often the doctors may need to interpret multiple results in order to provide you with the best plan of care or course of treatment. Therefore, we ask that you please give [place] 2 business days to thoroughly review all your results before contacting the office for clarification. Should we see a critical lab result, you will be contacted sooner.   If You Need Anything After Your Visit  If you have any questions or concerns for your doctor, please call our main line at [phone] and press option 4 to reach your doctor's medical assistant. If no one answers, please leave a voicemail as directed and we will return your call as soon as possible. Messages left after 4 pm will be answered the following business day.   You may also send [place] a message via [place]. We typically respond to MyChart messages within 1-2 business  days.  For prescription refills, please ask your pharmacy to contact our office. Our fax number is [phone].  If you have an urgent issue when the clinic is closed that cannot wait until the next business day, you can page your doctor at the number below.    Please note that while we do our best to be available for urgent issues outside of office hours, we are not available 24/7.   If you have an urgent issue and are unable to reach [place], you may choose to seek medical care at your doctor's office, retail clinic, urgent care center, or emergency room.  If you have a medical emergency, please immediately call 911 or go to the emergency department.  Pager Numbers  - Dr. Nehemiah Massed: [phone]  - Dr. Laurence Ferrari: [phone]  - Dr. [name]: [phone]  In the event of inclement weather, please call our main line at [phone] for an update on the status of any delays or closures.  Dermatology Medication Tips: Please keep the boxes that topical medications come in in order to help keep track of the instructions about where and how to use these. Pharmacies typically print the medication instructions only on the boxes and not directly on the medication tubes.   If your medication is too expensive, please contact our office at [phone] option 4 or send [place] a message through [place].   We are unable to tell what your co-pay for medications will be in advance as this is different depending on  your insurance coverage. However, we may be able to find a substitute medication at lower cost or fill out paperwork to get insurance to cover a needed medication.   If a prior authorization is required to get your medication covered by your insurance company, please allow [place] 1-2 business days to complete this process.  Drug prices often vary depending on where the prescription is filled and some pharmacies may offer cheaper prices.  The website www.goodrx.com contains coupons for medications through  different pharmacies. The prices here do not account for what the cost may be with help from insurance (it may be cheaper with your insurance), but the website can give you the price if you did not use any insurance.  - You can print the associated coupon and take it with your prescription to the pharmacy.  - You may also stop by our office during regular business hours and pick up a GoodRx coupon card.  - If you need your prescription sent electronically to a different pharmacy, notify our office through [facility] or by phone at [phone] option 4.     Si Usted Necesita Algo Despus de Su Visita  Tambin puede enviarnos un mensaje a travs de Pharmacist, community. Por lo general respondemos a los mensajes de MyChart en el transcurso de 1 a 2 das hbiles.  Para renovar recetas, por favor pida a su farmacia que se ponga en contacto con nuestra oficina. Harland Dingwall de fax es [place] [phone].  Si tiene un asunto urgente cuando la clnica est cerrada y que no puede esperar hasta el siguiente da hbil, puede llamar/localizar a su doctor(a) al nmero que aparece a continuacin.   Por favor, tenga en cuenta que aunque hacemos todo lo posible para estar disponibles para asuntos urgentes fuera del horario de [place], no estamos disponibles las 24 horas del da, los 7 das de la [place].   Si tiene un problema urgente y no puede comunicarse con nosotros, puede optar por buscar atencin mdica  en el consultorio de su doctor(a), en una clnica privada, en un centro de atencin urgente o en una sala de emergencias.  Si tiene Engineering geologist, por favor llame inmediatamente al 911 o vaya a la sala de emergencias.  Nmeros de bper  - Dr. Nehemiah Massed: [phone]  - Dra. Moye: [phone]  - Dra. [name]: [phone]  En caso de inclemencias del [place], por favor llame a Johnsie Kindred principal al [phone] para una actualizacin sobre el [place] de cualquier retraso o cierre.  Consejos  para la medicacin en dermatologa: Por favor, guarde las cajas en las que vienen los medicamentos de uso tpico para ayudarle a seguir las instrucciones sobre dnde y cmo usarlos. Las farmacias generalmente imprimen las instrucciones del medicamento slo en las cajas y no directamente en los tubos del [place].   Si su medicamento es muy caro, por favor, pngase en contacto con Zigmund Daniel llamando al [phone] y presione la opcin 4 o envenos un mensaje a travs de Pharmacist, community.   No podemos decirle cul ser su copago por los medicamentos por adelantado ya que esto es diferente dependiendo de la cobertura de su seguro. Sin embargo, es posible que podamos encontrar un medicamento sustituto a Electrical engineer un formulario para que el seguro cubra el medicamento que se considera necesario.   Si se requiere una autorizacin previa para que su compaa de seguros [place] su medicamento, por favor permtanos de 1 a 2 das hbiles para completar este proceso.  Los precios de los medicamentos varan con frecuencia dependiendo del Environmental consultant de dnde se surte la receta y alguna farmacias pueden ofrecer precios ms baratos.  El sitio web www.goodrx.com tiene cupones para medicamentos de Airline pilot. Los precios aqu no tienen en cuenta lo que podra costar con la ayuda del seguro (puede ser ms barato con su seguro), pero el sitio web puede darle el precio si no utiliz Research scientist (physical sciences).  - Puede imprimir el cupn correspondiente y llevarlo con su receta a la farmacia.  - Tambin puede pasar por nuestra oficina durante el horario de atencin regular y Charity fundraiser una tarjeta de cupones de GoodRx.  - Si necesita que su receta se enve electrnicamente a una farmacia diferente, informe a nuestra oficina a travs de MyChart de [place] o por telfono llamando al [phone] y presione la opcin 4.

## 2022-03-21 DIAGNOSIS — G4733 Obstructive sleep apnea (adult) (pediatric): Secondary | ICD-10-CM | POA: Diagnosis not present

## 2022-03-21 DIAGNOSIS — J449 Chronic obstructive pulmonary disease, unspecified: Secondary | ICD-10-CM | POA: Diagnosis not present

## 2022-03-21 DIAGNOSIS — E663 Overweight: Secondary | ICD-10-CM | POA: Diagnosis not present

## 2022-03-21 DIAGNOSIS — Z9981 Dependence on supplemental oxygen: Secondary | ICD-10-CM | POA: Diagnosis not present

## 2022-03-22 ENCOUNTER — Ambulatory Visit: Payer: Self-pay

## 2022-03-22 DIAGNOSIS — H353221 Exudative age-related macular degeneration, left eye, with active choroidal neovascularization: Secondary | ICD-10-CM | POA: Diagnosis not present

## 2022-03-22 NOTE — Patient Outreach (Signed)
  Care Coordination   Follow Up Visit Note   03/22/2022 Name: Dontee Jaso MRN: [id number] DOB: Jun 11, 1945  Chelsea Nusz Kentner is a 76 y.o. year old male who sees Perrin Maltese, MD for primary care. I spoke with  [employer] Canty by phone today.  What matters to the patients health and wellness today?  Patient reports having initial visit with new pulmonologist on yesterday 03/21/22.  Patient states his Advair was discontinued and he was prescribed Trelegy.  He reports medication has been picked up from the pharmacy and he started on it today.  Patient state he continues to wear his oxygen 24/7 at 3 L.  Denies any increase in or new symptoms at this time.     Goals Addressed             This Visit's Progress    Patient Stated:  Getting connected with new pulmonary provider and understand new treatment plan       Care Coordination Interventions: Confirmed patient saw new pulmonologist Reviewed scheduled / upcoming appointments.  Evaluation of current treatment plan related to COPD and patient's adherence to plan as established by provider Reviewed medications with patient and discussed importance of compliance.   Discussed with patient importance of rinse mouth out after using inhaler.             SDOH assessments and interventions completed:  No     Care Coordination Interventions:  Yes, provided   Follow up plan: Follow up call scheduled for 05/10/22    Encounter Outcome:  Pt. Visit Completed   Quinn Plowman RN,BSN,CCM [place] [phone] direct line

## 2022-03-23 DIAGNOSIS — I129 Hypertensive chronic kidney disease with stage 1 through stage 4 chronic kidney disease, or unspecified chronic kidney disease: Secondary | ICD-10-CM | POA: Diagnosis not present

## 2022-03-23 DIAGNOSIS — E1122 Type 2 diabetes mellitus with diabetic chronic kidney disease: Secondary | ICD-10-CM | POA: Diagnosis not present

## 2022-03-23 DIAGNOSIS — N184 Chronic kidney disease, stage 4 (severe): Secondary | ICD-10-CM | POA: Diagnosis not present

## 2022-03-27 DIAGNOSIS — E1122 Type 2 diabetes mellitus with diabetic chronic kidney disease: Secondary | ICD-10-CM | POA: Diagnosis not present

## 2022-03-27 DIAGNOSIS — N184 Chronic kidney disease, stage 4 (severe): Secondary | ICD-10-CM | POA: Diagnosis not present

## 2022-03-29 DIAGNOSIS — H353211 Exudative age-related macular degeneration, right eye, with active choroidal neovascularization: Secondary | ICD-10-CM | POA: Diagnosis not present

## 2022-04-03 DIAGNOSIS — N2581 Secondary hyperparathyroidism of renal origin: Secondary | ICD-10-CM | POA: Diagnosis not present

## 2022-04-03 DIAGNOSIS — E1122 Type 2 diabetes mellitus with diabetic chronic kidney disease: Secondary | ICD-10-CM | POA: Diagnosis not present

## 2022-04-03 DIAGNOSIS — N184 Chronic kidney disease, stage 4 (severe): Secondary | ICD-10-CM | POA: Diagnosis not present

## 2022-04-03 DIAGNOSIS — D631 Anemia in chronic kidney disease: Secondary | ICD-10-CM | POA: Diagnosis not present

## 2022-04-03 DIAGNOSIS — R809 Proteinuria, unspecified: Secondary | ICD-10-CM | POA: Diagnosis not present

## 2022-04-10 ENCOUNTER — Other Ambulatory Visit: Payer: Self-pay

## 2022-04-10 DIAGNOSIS — R0602 Shortness of breath: Secondary | ICD-10-CM

## 2022-04-19 ENCOUNTER — Ambulatory Visit: Payer: Medicare HMO | Admitting: Internal Medicine

## 2022-04-22 DIAGNOSIS — I129 Hypertensive chronic kidney disease with stage 1 through stage 4 chronic kidney disease, or unspecified chronic kidney disease: Secondary | ICD-10-CM | POA: Diagnosis not present

## 2022-04-22 DIAGNOSIS — N184 Chronic kidney disease, stage 4 (severe): Secondary | ICD-10-CM | POA: Diagnosis not present

## 2022-04-22 DIAGNOSIS — E1122 Type 2 diabetes mellitus with diabetic chronic kidney disease: Secondary | ICD-10-CM | POA: Diagnosis not present

## 2022-04-26 ENCOUNTER — Inpatient Hospital Stay: Payer: Medicare HMO | Attending: Oncology

## 2022-04-26 ENCOUNTER — Encounter: Payer: Self-pay | Admitting: Oncology

## 2022-04-26 ENCOUNTER — Inpatient Hospital Stay: Payer: Medicare HMO | Admitting: Oncology

## 2022-04-26 VITALS — BP 137/60 | HR 63 | Temp 97.1°F | Resp 16 | Wt 199.5 lb

## 2022-04-26 DIAGNOSIS — D631 Anemia in chronic kidney disease: Secondary | ICD-10-CM

## 2022-04-26 DIAGNOSIS — N184 Chronic kidney disease, stage 4 (severe): Secondary | ICD-10-CM | POA: Diagnosis not present

## 2022-04-26 DIAGNOSIS — E1122 Type 2 diabetes mellitus with diabetic chronic kidney disease: Secondary | ICD-10-CM | POA: Diagnosis not present

## 2022-04-26 DIAGNOSIS — G4733 Obstructive sleep apnea (adult) (pediatric): Secondary | ICD-10-CM | POA: Diagnosis not present

## 2022-04-26 DIAGNOSIS — D509 Iron deficiency anemia, unspecified: Secondary | ICD-10-CM | POA: Diagnosis not present

## 2022-04-26 DIAGNOSIS — I5022 Chronic systolic (congestive) heart failure: Secondary | ICD-10-CM | POA: Diagnosis not present

## 2022-04-26 DIAGNOSIS — I130 Hypertensive heart and chronic kidney disease with heart failure and stage 1 through stage 4 chronic kidney disease, or unspecified chronic kidney disease: Secondary | ICD-10-CM | POA: Insufficient documentation

## 2022-04-26 LAB — IRON AND TIBC
Iron: 79 ug/dL (ref 45–182)
Saturation Ratios: 31 % (ref 17.9–39.5)
TIBC: 252 ug/dL (ref 250–450)
UIBC: 173 ug/dL

## 2022-04-26 LAB — FERRITIN: Ferritin: 68 ng/mL (ref 24–336)

## 2022-04-26 NOTE — Progress Notes (Signed)
Pt and wife in for follow up today, denies any concerns.

## 2022-04-26 NOTE — Progress Notes (Signed)
Hematology/Oncology Consult note [facility]  [phone] [phone]  Patient Care Team: Perrin Maltese, MD as PCP - General (Internal Medicine) Dannielle Karvonen, RN as S[place]er Management [name], MD as Consulting Physician (Oncology)   Name of the patient: [name] [name]  [id number]  [birth year]   Date of visit: 04/26/22  Diagnosis-normocytic anemia likely secondary to chronic disease as well as chronic kidney disease  Chief complaint/ Reason for visit-routine follow-up of anemia  Heme/Onc history:  Patient is a 77 year old male with a past medical history significant for systolic congestive heart failure, type 2 diabetes, obstructive sleep apnea on CPAP, CKD, BPH among other medical problems.  He has been referred to [place] for anemia.  Most recent CBC from 10/04/2021 showed white cell count of 6.8, H&H of 10.3/30.4 with a platelet count of 179.  His baseline hemoglobin runs around 12 and was around that value until March 2023.  Patient does have baseline chronic kidney disease with a creatinine that fluctuates between 2.4-3.1.  Patient follows up with Dr. Zollie Scale for his stage IV chronic kidney disease.     Results of blood work from 10/10/2021 were as follows: CBC showed white count of 5.5, H&H of 10.3/30.5 with a platelet count of 198.  CMP showed a serum creatinine of 3.38.  B12 folate normal.  Haptoglobin TSH normal.  Myeloma panel showed no M protein.  Ferritin levels normal at 213      Interval history-patient has baseline exertional shortness of breath due to his COPD and heart failure which is at his baseline.  No recent hospitalizations.  ECOG PS- 2 Pain scale- 0 Opioid associated constipation- no  Review of systems- Review of Systems  Constitutional:  Positive for malaise/fatigue. Negative for chills, fever and weight loss.  HENT:  Negative for congestion, ear discharge and nosebleeds.   Eyes:   Negative for blurred vision.  Respiratory:  Positive for shortness of breath. Negative for cough, hemoptysis, sputum production and wheezing.   Cardiovascular:  Negative for chest pain, palpitations, orthopnea and claudication.  Gastrointestinal:  Negative for abdominal pain, blood in stool, constipation, diarrhea, heartburn, melena, nausea and vomiting.  Genitourinary:  Negative for dysuria, flank pain, frequency, hematuria and urgency.  Musculoskeletal:  Negative for back pain, joint pain and myalgias.  Skin:  Negative for rash.  Neurological:  Negative for dizziness, tingling, focal weakness, seizures, weakness and headaches.  Endo/Heme/Allergies:  Does not bruise/bleed easily.  Psychiatric/Behavioral:  Negative for depression and suicidal ideas. The patient does not have insomnia.       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 post. helix. 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 MOHS.   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/07/2021   Right zygoma, EDC 05/02/21   Supplemental oxygen dependent    3-4 L/[place]   T2DM (type 2 diabetes  mellitus) ([place])      Past Surgical History:  Procedure Laterality Date   AORTIC VALVE REPLACEMENT N/A 08/01/2016   29 mm CoreValve Evolut; Location: [facility]; Surgeon: Durward Mallard, MD   CARDIAC CATHETERIZATION N/A 12/26/2012   2v CAD; retained pigtail in myocardium --> transferred to [facility]; Location: [place]; Surgeon: Lowanda Foster, MD   COLONOSCOPY     KNEE ARTHROSCOPY WITH MEDIAL MENISECTOMY Left 12/02/2020   Procedure: KNEE ARTHROSCOPY WITH DEBRIDEMENT AND PARTIAL MEDIAL AND LATERAL MENISECTOMY;  Surgeon: Corky Mull, MD;  Location: [facility];  Service: Orthopedics;  Laterality: Left;   LITHOTRIPSY     PERCUTANEOUS REMOVAL INTRA-AORTIC BALLOON CATH N/A 12/26/2012   Procedure: PERCUTANEOUS REMOVAL INTRA-AORTIC BALLOON CATH; Surgeon: Sande Brothers, MD; Location: [facility]PER[facility] ROOMS; Service: Cardiothoracic   RIGHT HEART CATH Right 07/13/2016   Location: [facility]; Surgeon: Pura Spice, MD   TRANSESOPHAGEAL ECHOCARDIOGRAM N/A 12/26/2012   Procedure: TRANSESOPHAGEAL ECHOCARDIOGRAPHY; Surgeon: Sande Brothers, MD; Location: [facility]; Service: Cardiothoracic   UMBILICAL HERNIA REPAIR N/A 02/13/2014   Procedure: LAPAROSCOPIC UMBILICAL HERNIA REPAIR; Surgeon: Clydene Pugh, MD; Location: [facility]; Service: General Surgery    Social History   Socioeconomic History   Marital status: Married    Spouse name: Not on file   Number of children: Not on file   Years of education: Not on file   Highest education level: Not on file  Occupational History   Not on file  Tobacco Use   Smoking status: Former    Packs/day: 2.00    Years: 54.00    Total pack years: 108.00    Types: Cigarettes    Quit date: 2010    Years since quitting: 14.0   Smokeless tobacco: Former    Types: Chew    Quit date: 05/16/2004  Vaping Use   Vaping Use: Never used  Substance and Sexual Activity   Alcohol use: No   Drug use: Never   Sexual activity: Not on file  Other Topics Concern    Not on file  Social History Narrative   Not on file   Social Determinants of Health   Financial Resource Strain: Not on file  Food Insecurity: No Food Insecurity (02/14/2022)   Hunger Vital Sign    Worried About Running Out of Food in the Last Year: Never true    Ran Out of Food in the Last Year: Never true  Transportation Needs: No Transportation Needs (02/14/2022)   PRAPARE - Hydrologist (Medical): No    Lack of Transportation (Non-Medical): No  Physical Activity: Not on file  Stress: Not on file  Social Connections: Not on file  Intimate Partner Violence: Not on file    Family History  Adopted: Yes  Problem Relation Age of Onset   Varicose Veins Daughter    Obesity Daughter    Hyperlipidemia Daughter    Diabetes Daughter    COPD Daughter    Depression Daughter    Asthma Daughter    Arthritis Son  Diabetes Son    Hypertension Son      Current Outpatient Medications:    acetaminophen (TYLENOL) 650 MG CR tablet, Take 1,300 mg by mouth every 8 (eight) hours as needed for pain., Disp: , Rfl:    albuterol (VENTOLIN HFA) 108 (90 Base) MCG/ACT inhaler, Inhale 2 puffs into the lungs every 6 (six) hours as needed for wheezing or shortness of breath., Disp: , Rfl:    Ascorbic Acid (VITAMIN C) 1000 MG tablet, Take 1,000 mg by mouth at bedtime., Disp: , Rfl:    aspirin EC 81 MG EC tablet, Take 1 tablet (81 mg total) by mouth daily., Disp: , Rfl:    citalopram (CELEXA) 40 MG tablet, Take 40 mg by mouth every morning., Disp: , Rfl:    Evolocumab (REPATHA SURECLICK) 784 MG/ML SOAJ, Inject 140 mg into the skin every 14 (fourteen) days., Disp: , Rfl:    ezetimibe (ZETIA) 10 MG tablet, Take 10 mg by mouth at bedtime., Disp: , Rfl:    fexofenadine (ALLEGRA) 180 MG tablet, Take 180 mg by mouth daily as needed for allergies or rhinitis., Disp: , Rfl:    Fluticasone-Umeclidin-Vilant (TRELEGY ELLIPTA) 100-62.5-25 MCG/ACT AEPB, Inhale 100 mcg into the lungs  daily at 6 (six) AM., Disp: , Rfl:    furosemide (LASIX) 20 MG tablet, Take 20 mg by mouth every morning., Disp: , Rfl:    gabapentin (NEURONTIN) 100 MG capsule, Take 2 capsules (200 mg total) by mouth 2 (two) times daily. (Patient taking differently: Take 200 mg by mouth in the morning.), Disp: , Rfl:    gabapentin (NEURONTIN) 400 MG capsule, Take 400 mg by mouth at bedtime., Disp: , Rfl:    ipratropium-albuterol (DUONEB) 0.5-2.5 (3) MG/3ML SOLN, Take 3 mLs by nebulization every 4 (four) hours as needed., Disp: 360 mL, Rfl: 3   ketoconazole (NIZORAL) 2 % cream, Apply 1-2 times daily to face, scalp and ears as needed., Disp: 60 g, Rfl: 2   losartan (COZAAR) 50 MG tablet, , Disp: , Rfl:    metoprolol succinate (TOPROL-XL) 50 MG 24 hr tablet, Take 50 mg by mouth every morning. Take with or immediately following a meal., Disp: , Rfl:    Multiple Vitamins-Minerals (PRESERVISION AREDS) CAPS, Take by mouth., Disp: , Rfl:    NON FORMULARY, cpap w/o2, Disp: , Rfl:    OXYGEN, Inhale 3 L into the lungs See admin instructions. Used as needed throughout the day and continuous at bedtime, Disp: , Rfl:    pantoprazole (PROTONIX) 40 MG tablet, Take 40 mg by mouth every morning., Disp: , Rfl:    Simethicone (GAS-X PO), Take 1 tablet by mouth 2 (two) times daily as needed (flatulence)., Disp: , Rfl:    sodium chloride (OCEAN) 0.65 % SOLN nasal spray, Place 1 spray into both nostrils as needed for congestion., Disp: , Rfl: 0   SUPER B COMPLEX/C PO, Take 1 tablet by mouth daily., Disp: , Rfl:    tamsulosin (FLOMAX) 0.4 MG CAPS capsule, Take 1 capsule (0.4 mg total) by mouth daily., Disp: 30 capsule, Rfl: 0   fluticasone (FLONASE) 50 MCG/ACT nasal spray, Place 2 sprays into both nostrils daily. (Patient not taking: Reported on 04/26/2022), Disp: , Rfl: 2   methocarbamol (ROBAXIN) 750 MG tablet, Take 750 mg by mouth 2 (two) times daily as needed for muscle spasms. (Patient not taking: Reported on 04/26/2022), Disp: , Rfl:     Omega-3 Fatty Acids (OMEGA-3 FISH OIL PO), Take 1 tablet by mouth daily. (Patient  not taking: Reported on 10/10/2021), Disp: , Rfl:    rosuvastatin (CRESTOR) 5 MG tablet, Take 5 mg by mouth daily. (Patient not taking: Reported on 04/26/2022), Disp: , Rfl:   Current Facility-Administered Medications:    ipratropium-albuterol (DUONEB) 0.5-2.5 (3) MG/3ML nebulizer solution 3 mL, 3 mL, Nebulization, Q6H PRN, Allyne Gee, MD  Physical exam:  Vitals:   04/26/22 1124  BP: 137/60  Pulse: 63  Resp: 16  Temp: (!) 97.1 F (36.2 C)  TempSrc: Tympanic  SpO2: 100%  Weight: 199 lb 8 oz (90.5 kg)   Physical Exam Constitutional:      Comments: He is on home oxygen  Cardiovascular:     Rate and Rhythm: Normal rate and regular rhythm.     Heart sounds: Normal heart sounds.  Pulmonary:     Effort: Pulmonary effort is normal.     Comments: Breath sounds decreased bilaterally over bases Abdominal:     General: Bowel sounds are normal.     Palpations: Abdomen is soft.  Skin:    General: Skin is warm and dry.  Neurological:     Mental Status: He is alert and oriented to person, place, and time.         Latest Ref Rng & Units 10/10/2021    2:48 PM  CMP  Glucose 70 - 99 mg/dL 113   BUN 8 - 23 mg/dL 58   Creatinine 0.61 - 1.24 mg/dL 3.38   Sodium 135 - 145 mmol/L 141   Potassium 3.5 - 5.1 mmol/L 5.1   Chloride 98 - 111 mmol/L 108   CO2 22 - 32 mmol/L 22   Calcium 8.9 - 10.3 mg/dL 8.9   Total Protein 6.5 - 8.1 g/dL 7.3   Total Bilirubin 0.3 - 1.2 mg/dL 0.5   Alkaline Phos 38 - 126 U/L 76   AST 15 - 41 U/L 16   ALT 0 - 44 U/L 11       Latest Ref Rng & Units 10/10/2021    2:48 PM  CBC  WBC 4.0 - 10.5 K/uL 5.5   Hemoglobin 13.0 - 17.0 g/dL 10.3   Hematocrit 39.0 - 52.0 % 30.5   Platelets 150 - 400 K/uL 198      Assessment and plan- Patient is a 77 y.o. male here for routine follow-up of anemia of chronic kidney disease  Patient had recent lab work done a couple of weeks ago  which shows a stableHemoglobin of 10.6.  Ferritin levels are 68 today with an iron saturation of 31%.  I will hold off on giving him any IV iron at this time.  He does have stage IV kidney disease and anemia as likely secondary to it.  Given that his hemoglobin is more than 9 he does not require any EPO at this time.  I will see him back in 6 months with labs and interim labs will be done at 3 months at the nephrology office.   Visit Diagnosis 1. Anemia of chronic kidney failure, stage 4 (severe) ([place])   2. Iron deficiency anemia, unspecified iron deficiency anemia type      Dr. Randa Evens, MD, MPH [facility] [phone] 04/26/2022 2:29 PM

## 2022-05-02 ENCOUNTER — Ambulatory Visit: Payer: Medicare HMO | Admitting: Internal Medicine

## 2022-05-08 DIAGNOSIS — E1169 Type 2 diabetes mellitus with other specified complication: Secondary | ICD-10-CM | POA: Diagnosis not present

## 2022-05-08 DIAGNOSIS — H353221 Exudative age-related macular degeneration, left eye, with active choroidal neovascularization: Secondary | ICD-10-CM | POA: Diagnosis not present

## 2022-05-08 DIAGNOSIS — I10 Essential (primary) hypertension: Secondary | ICD-10-CM | POA: Diagnosis not present

## 2022-05-08 DIAGNOSIS — E782 Mixed hyperlipidemia: Secondary | ICD-10-CM | POA: Diagnosis not present

## 2022-05-08 DIAGNOSIS — I2510 Atherosclerotic heart disease of native coronary artery without angina pectoris: Secondary | ICD-10-CM | POA: Diagnosis not present

## 2022-05-08 DIAGNOSIS — E663 Overweight: Secondary | ICD-10-CM | POA: Diagnosis not present

## 2022-05-08 DIAGNOSIS — I351 Nonrheumatic aortic (valve) insufficiency: Secondary | ICD-10-CM | POA: Diagnosis not present

## 2022-05-08 DIAGNOSIS — J449 Chronic obstructive pulmonary disease, unspecified: Secondary | ICD-10-CM | POA: Diagnosis not present

## 2022-05-08 DIAGNOSIS — I340 Nonrheumatic mitral (valve) insufficiency: Secondary | ICD-10-CM | POA: Diagnosis not present

## 2022-05-08 DIAGNOSIS — G4739 Other sleep apnea: Secondary | ICD-10-CM | POA: Diagnosis not present

## 2022-05-10 ENCOUNTER — Ambulatory Visit: Payer: Self-pay

## 2022-05-10 NOTE — Patient Outreach (Signed)
  Care Coordination   Follow Up Visit Note   05/10/2022 Name: [name] [name] MRN: [id number] DOB: 1946/03/05  [name] [name] is a 77 y.o. year old male who sees [name] Maltese, MD for primary care. I spoke with  [name] [name] by phone today.  What matters to the patients health and wellness today?   Patient states he continues to manage his conditions and does not feel he needs ongoing care coordination follow up at this time.    Goals Addressed             This Visit's Progress    COMPLETED: Patient Stated:  Getting connected with new pulmonary provider and understand new treatment plan       Care Coordination Interventions: Reviewed scheduled / upcoming appointments:  Patient reports next follow up with pulmonologist is the end of January 2024.    Evaluation of current treatment plan related to COPD and patient's adherence to plan as established by provider:   Patient states he continues taking the telegy prescribed.  He states he feels the medication is working well.  He states he is able to walk longer distances. He states he still occasionally gets short of breath but has much improved. Patient states he continues to wear his oxygen 3 L around the clock.   Reviewed medications with patient and discussed importance of compliance.               SDOH assessments and interventions completed:  No     Care Coordination Interventions:  Yes, provided   Follow up plan: No further intervention required.   Encounter Outcome:  Pt. Visit Completed   [name] Plowman RN,BSN,CCM [place] [phone] direct line

## 2022-05-12 DIAGNOSIS — H353211 Exudative age-related macular degeneration, right eye, with active choroidal neovascularization: Secondary | ICD-10-CM | POA: Diagnosis not present

## 2022-05-17 DIAGNOSIS — Z9981 Dependence on supplemental oxygen: Secondary | ICD-10-CM | POA: Diagnosis not present

## 2022-05-17 DIAGNOSIS — J301 Allergic rhinitis due to pollen: Secondary | ICD-10-CM | POA: Diagnosis not present

## 2022-05-17 DIAGNOSIS — G4733 Obstructive sleep apnea (adult) (pediatric): Secondary | ICD-10-CM | POA: Diagnosis not present

## 2022-05-17 DIAGNOSIS — J449 Chronic obstructive pulmonary disease, unspecified: Secondary | ICD-10-CM | POA: Diagnosis not present

## 2022-05-25 ENCOUNTER — Other Ambulatory Visit: Payer: Self-pay

## 2022-05-25 ENCOUNTER — Other Ambulatory Visit: Payer: Self-pay | Admitting: Family

## 2022-05-25 DIAGNOSIS — Z1159 Encounter for screening for other viral diseases: Secondary | ICD-10-CM | POA: Diagnosis not present

## 2022-05-25 DIAGNOSIS — E1165 Type 2 diabetes mellitus with hyperglycemia: Secondary | ICD-10-CM | POA: Diagnosis not present

## 2022-05-25 DIAGNOSIS — E1122 Type 2 diabetes mellitus with diabetic chronic kidney disease: Secondary | ICD-10-CM

## 2022-05-25 DIAGNOSIS — E782 Mixed hyperlipidemia: Secondary | ICD-10-CM | POA: Diagnosis not present

## 2022-05-25 DIAGNOSIS — D519 Vitamin B12 deficiency anemia, unspecified: Secondary | ICD-10-CM | POA: Diagnosis not present

## 2022-05-25 DIAGNOSIS — Z125 Encounter for screening for malignant neoplasm of prostate: Secondary | ICD-10-CM | POA: Diagnosis not present

## 2022-05-25 DIAGNOSIS — E559 Vitamin D deficiency, unspecified: Secondary | ICD-10-CM | POA: Diagnosis not present

## 2022-05-25 DIAGNOSIS — E039 Hypothyroidism, unspecified: Secondary | ICD-10-CM

## 2022-05-25 DIAGNOSIS — E785 Hyperlipidemia, unspecified: Secondary | ICD-10-CM

## 2022-05-26 LAB — CBC WITH DIFFERENTIAL/PLATELET
Basophils Absolute: 0 10*3/uL (ref 0.0–0.2)
Basos: 0 %
EOS (ABSOLUTE): 0 10*3/uL (ref 0.0–0.4)
Eos: 1 %
Hematocrit: 35.3 % — ABNORMAL LOW (ref 37.5–51.0)
Hemoglobin: 11.9 g/dL — ABNORMAL LOW (ref 13.0–17.7)
Immature Grans (Abs): 0 10*3/uL (ref 0.0–0.1)
Immature Granulocytes: 1 %
Lymphocytes Absolute: 1.7 10*3/uL (ref 0.7–3.1)
Lymphs: 26 %
MCH: 31.5 pg (ref 26.6–33.0)
MCHC: 33.7 g/dL (ref 31.5–35.7)
MCV: 93 fL (ref 79–97)
Monocytes Absolute: 0.5 10*3/uL (ref 0.1–0.9)
Monocytes: 8 %
Neutrophils Absolute: 4.1 10*3/uL (ref 1.4–7.0)
Neutrophils: 64 %
Platelets: 125 10*3/uL — ABNORMAL LOW (ref 150–450)
RBC: 3.78 x10E6/uL — ABNORMAL LOW (ref 4.14–5.80)
RDW: 12.6 % (ref 11.6–15.4)
WBC: 6.4 10*3/uL (ref 3.4–10.8)

## 2022-05-26 LAB — COMPREHENSIVE METABOLIC PANEL
ALT: 7 IU/L (ref 0–44)
AST: 15 IU/L (ref 0–40)
Albumin/Globulin Ratio: 1.2 (ref 1.2–2.2)
Albumin: 3.6 g/dL — ABNORMAL LOW (ref 3.8–4.8)
Alkaline Phosphatase: 76 IU/L (ref 44–121)
BUN/Creatinine Ratio: 15 (ref 10–24)
BUN: 42 mg/dL — ABNORMAL HIGH (ref 8–27)
Bilirubin Total: 0.3 mg/dL (ref 0.0–1.2)
CO2: 22 mmol/L (ref 20–29)
Calcium: 8.9 mg/dL (ref 8.6–10.2)
Chloride: 107 mmol/L — ABNORMAL HIGH (ref 96–106)
Creatinine, Ser: 2.81 mg/dL — ABNORMAL HIGH (ref 0.76–1.27)
Globulin, Total: 3 g/dL (ref 1.5–4.5)
Glucose: 89 mg/dL (ref 70–99)
Potassium: 5.3 mmol/L — ABNORMAL HIGH (ref 3.5–5.2)
Sodium: 144 mmol/L (ref 134–144)
Total Protein: 6.6 g/dL (ref 6.0–8.5)
eGFR: 22 mL/min/{1.73_m2} — ABNORMAL LOW (ref >59)

## 2022-05-26 LAB — VITAMIN D 25 HYDROXY (VIT D DEFICIENCY, FRACTURES): Vit D, 25-Hydroxy: 32 ng/mL (ref 30.0–100.0)

## 2022-05-26 LAB — HGB A1C W/O EAG: Hgb A1c MFr Bld: 5.5 % (ref 4.8–5.6)

## 2022-05-26 LAB — LIPID PANEL W/O CHOL/HDL RATIO
Cholesterol, Total: 100 mg/dL (ref 100–199)
HDL: 45 mg/dL (ref >39)
LDL Chol Calc (NIH): 39 mg/dL (ref 0–99)
Triglycerides: 77 mg/dL (ref 0–149)
VLDL Cholesterol Cal: 16 mg/dL (ref 5–40)

## 2022-05-27 ENCOUNTER — Encounter: Payer: Self-pay | Admitting: Family

## 2022-05-27 DIAGNOSIS — I5020 Unspecified systolic (congestive) heart failure: Secondary | ICD-10-CM

## 2022-05-29 ENCOUNTER — Encounter: Payer: Self-pay | Admitting: Family

## 2022-05-29 ENCOUNTER — Ambulatory Visit: Payer: Medicare HMO | Admitting: Family

## 2022-05-29 VITALS — BP 132/70 | HR 67 | Ht 65.0 in | Wt 200.0 lb

## 2022-05-29 DIAGNOSIS — I509 Heart failure, unspecified: Secondary | ICD-10-CM

## 2022-05-29 DIAGNOSIS — R143 Flatulence: Secondary | ICD-10-CM

## 2022-05-29 DIAGNOSIS — E1122 Type 2 diabetes mellitus with diabetic chronic kidney disease: Secondary | ICD-10-CM

## 2022-05-29 DIAGNOSIS — K591 Functional diarrhea: Secondary | ICD-10-CM

## 2022-05-29 DIAGNOSIS — J9611 Chronic respiratory failure with hypoxia: Secondary | ICD-10-CM

## 2022-05-29 DIAGNOSIS — Z794 Long term (current) use of insulin: Secondary | ICD-10-CM

## 2022-05-29 DIAGNOSIS — N2581 Secondary hyperparathyroidism of renal origin: Secondary | ICD-10-CM

## 2022-05-29 DIAGNOSIS — Z720 Tobacco use: Secondary | ICD-10-CM | POA: Diagnosis not present

## 2022-05-29 DIAGNOSIS — J449 Chronic obstructive pulmonary disease, unspecified: Secondary | ICD-10-CM | POA: Diagnosis not present

## 2022-05-29 DIAGNOSIS — R142 Eructation: Secondary | ICD-10-CM

## 2022-05-29 DIAGNOSIS — R141 Gas pain: Secondary | ICD-10-CM

## 2022-05-29 DIAGNOSIS — I739 Peripheral vascular disease, unspecified: Secondary | ICD-10-CM

## 2022-05-29 LAB — POCT UA - MICROALBUMIN
Albumin/Creatinine Ratio, Urine, POC: >300
Creatinine, POC: 50 mg/dL
Microalbumin Ur, POC: 150 mg/L

## 2022-05-29 LAB — GLUCOSE, POCT (MANUAL RESULT ENTRY): POC Glucose: 174 mg/dl — AB (ref 70–99)

## 2022-05-29 NOTE — Progress Notes (Signed)
Established Patient Office Visit  Subjective   Patient ID: [name] [name], male    DOB: 01/06/1946  Age: 77 y.o. MRN: [id number]  No chief complaint on file.   Patient is here for his 3 month f/u.   He has been feeling well since her last appointment.  He did recently have COVID  Had labs drawn last week so we will review these today.  No other concerns at this time         Health Maintenance reviewed - urine microalbumin ordered    Patient Active Problem List   Diagnosis Date Noted   Plica syndrome of left knee 10/18/2020   Hyperkalemia 07/30/2020   OSA on CPAP 07/30/2020   BPH (benign prostatic hyperplasia) 07/30/2020   GERD (gastroesophageal reflux disease) 07/30/2020   HFrEF (heart failure with reduced ejection fraction) ([place]) 07/30/2020   Chronic respiratory failure with hypoxia ([place]) 07/27/2020   CKD (chronic kidney disease), stage IV ([place]) 07/27/2020   Essential hypertension 07/27/2020   Minimal change disease 02/24/2019   Proteinuria 02/24/2019   Secondary hyperparathyroidism of renal origin ([place]) 02/24/2019   Type 2 diabetes mellitus with diabetic chronic kidney disease ([place]) 02/24/2019   On home oxygen therapy 08/02/2016   Idiopathic hypotension 07/26/2016   COPD exacerbation ([place])    Arthralgia of multiple sites 07/12/2015   Coronary artery disease 12/27/2012   Hyperlipidemia 12/27/2012   Past Medical History:  Diagnosis Date   Accidental cut, puncture, perforation, or hemorrhage during heart catheterization 12/27/2012   Acute postoperative pain 08/02/2016   Acute renal failure (ARF) ([place])    AKI (acute kidney injury) ([place]) 07/27/2020   ANA positive 07/12/2015   Aortic stenosis, 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 post. helix. Excised 03/02/2010   BCC (basal cell carcinoma of skin) 11/24/2020   R neck below the ear, EDC   Benign hypertensive kidney disease with chronic kidney  disease 02/24/2019   CAD (coronary artery disease)    CAP (community acquired pneumonia) 07/26/2020   CKD (chronic kidney disease), stage IV (HCC)    Complete tear of right rotator cuff 11/15/2015   Complex tear of lateral meniscus of left knee as current injury 12/06/2020   Complex tear of medial meniscus of left knee as current injury 10/18/2020   COPD (chronic obstructive pulmonary disease) ([place])    DM (diabetes mellitus), type 2 ([place]) 07/30/2020   DOE (dyspnea on exertion)    GERD (gastroesophageal reflux disease)    Heart murmur    HFrEF (heart failure with reduced ejection fraction) ([place])    History of hiatal hernia    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  Performed by Dr. Cheree Ditto on 08/01/16:  29 mm commercially available Medtronic Core Valve transcatheter aortic valve procedure placed via transfemoral approach by Dr. Cheree Ditto and co-surgeon Dr. Sheila Oats.   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 MOHS.   Squamous cell carcinoma  of skin 03/07/2021   Right zygoma, EDC 05/02/21   Supplemental oxygen dependent    3-4 L/[place]   T2DM (type 2 diabetes mellitus) ([place])       Review of Systems  Gastrointestinal:  Positive for abdominal pain and diarrhea.      Objective:     BP 132/70 (BP Location: Right Arm)   Pulse 67   Ht '5\' 5"'$  (1.651 m)   Wt 200 lb (90.7 kg)   SpO2 96%   BMI 33.28 kg/m  BP Readings from Last 3 Encounters:  05/29/22 132/70  02/24/22 124/72  04/26/22 137/60      Physical  Exam Vitals reviewed.  Constitutional:      General: He is not in acute distress.    Appearance: Normal appearance. He is obese.  HENT:     Head: Normocephalic.     Nose: Nose normal.  Pulmonary:     Comments: Supplemental Oxygen at 3lpm Abdominal:     General: Abdomen is protuberant. Bowel sounds are normal.     Palpations: Abdomen is soft.  Musculoskeletal:     Cervical back: Normal range of motion.  Neurological:     Mental Status: He is alert.      No results found for any visits on 05/29/22.  Last CBC Lab Results  Component Value Date   WBC 6.4 05/25/2022   HGB 11.9 (L) 05/25/2022   HCT 35.3 (L) 05/25/2022   MCV 93 05/25/2022   MCH 31.5 05/25/2022   RDW 12.6 05/25/2022   PLT 125 (L) 27/25/3664   Last metabolic panel Lab Results  Component Value Date   GLUCOSE 89 05/25/2022   NA 144 05/25/2022   K 5.3 (H) 05/25/2022   CL 107 (H) 05/25/2022   CO2 22 05/25/2022   BUN 42 (H) 05/25/2022   CREATININE 2.81 (H) 05/25/2022   EGFR 22 (L) 05/25/2022   CALCIUM 8.9 05/25/2022   PHOS 4.3 07/10/2016   PROT 6.6 05/25/2022   ALBUMIN 3.6 (L) 05/25/2022   LABGLOB 3.0 05/25/2022   AGRATIO 1.2 05/25/2022   BILITOT 0.3 05/25/2022   ALKPHOS 76 05/25/2022   AST 15 05/25/2022   ALT 7 05/25/2022   ANIONGAP 11 10/10/2021   Last lipids Lab Results  Component Value Date   CHOL 100 05/25/2022   HDL 45 05/25/2022   LDLCALC 39 05/25/2022   TRIG 77 05/25/2022   Last hemoglobin A1c Lab Results  Component Value Date   HGBA1C 5.5 05/25/2022   Last vitamin D Lab Results  Component Value Date   VD25OH 32.0 05/25/2022      The ASCVD Risk score (Arnett DK, et al., 2019) failed to calculate for the following reasons:   The valid total cholesterol range is 130 to 320 mg/dL    Assessment & Plan:   Problem List Items Addressed This Visit     Type 2 diabetes mellitus with diabetic chronic kidney disease ([place]) - Primary   Relevant Orders   POCT glucose (manual entry)     No follow-ups on file.    [place], FNP

## 2022-05-29 NOTE — Patient Instructions (Addendum)
Will send referral for GI for pt.   Labs reviewed today.  Microalbumin High Abnormal, Patient is already under the care of nephro.   CT Low Dose Screening ordered.   Continue current meds POC reviewed and agreed to.

## 2022-06-01 ENCOUNTER — Encounter: Payer: Self-pay | Admitting: Family

## 2022-06-01 MED ORDER — EZETIMIBE 10 MG PO TABS: 10.0000 mg | ORAL_TABLET | Freq: Every day | ORAL | 3 refills | Status: DC

## 2022-06-01 MED ORDER — EZETIMIBE 10 MG PO TABS: 10.0000 mg | ORAL_TABLET | Freq: Every day | ORAL | 0 refills | Status: DC

## 2022-06-02 ENCOUNTER — Telehealth: Payer: Self-pay

## 2022-06-02 NOTE — Telephone Encounter (Signed)
COPD Review Call  [name] [name], [name] [name]  74 years, Male  DOB: Feb 04, 1946  M:   __________________________________________________ COPD Review [facility]) Chart Review Eosinophil Count (last): 1 on: 05/25/2022 Last CAT Score: 13 on: 05/31/2022 What recent interventions have been made by any provider to improve the patient's conditions in the last 3 months?: Office Visit: 05/29/22 [name] [name]. NP-C. For Type 2 DM No medication changes. 05/01/22 [name] [name]. NP-C.  (Not A Acutal Visit)n STARTED Lagevrio 200 mg for 5 days and Prednisone 20 mg 14 days  Consults: 04/26/22 Oncology [name] [place], MD. For iron deficiency STOPPED Probiotic, Fluticasone, and Cholecalciferol. 05/08/22 [name] Laming, MD. For follow-up. No medication changes. 05/17/22 Pulmonology [name] Pian, MD. For COPD. Has there been any documented recent hospitalizations or ED visits since last visit with Clinical Lead?: No Adherence Review Does the [facility] have access to medication refill data?: Yes Adherence rates for STAR metric medications: Losartan 50 mg - 03/14/22 90 DS Ezetimibe 10 mg - 12/30/21 90 DS PER EMR - Requested for a refill for patient, patient was unable to find medication bottle at home . Adherence rates for medications indicated for disease state being reviewed: None . Does the patient have >5 day gap between last estimated fill dates for any of the above medications?: Yes Reasons for medication gaps: Ezetimibe 10 mg - 12/30/21 90 DS PER EMR - Requested for a refill for patient, patient was unable to find medication bottle at home . Disease State Questions Able to connect with the Patient?: Yes How often do you use your rescue inhaler Albuterol?: Never Is patient prescribed a maintenance inhaler?: Yes How often is patient using their maintenance inhaler?: Daily CAT Assessment: Done How often are you coughing?: 0 (Never cough) How much mucus are you feeling in your chest?: 4 How much chest tightness are you  having?: 0 (My chest does not feel tight at all) How is your breath when you walk up a hill or flight of stairs?: 3 How are your limitations to doing activities at home?: 1 How would you rate your confidence in leaving your home due to your lung condition?: 0 (I am confident leaving my home despite my lung condition) How are you sleeping?: 2 How is your energy level?: 3 Total CAT Score: 13 Engagement Notes [name], [name] on 05/31/2022 02:32 PM Sched appointment for 09/04/22 at 2 pm Face to face . [name], [name] on 05/31/2022 02:30 PM [facility] Chart Review: 12 min 2/724 [facility] Assessment call time spent: 13 min 05/31/22   [name] [name] on 03/03/2022 07:51 AM HC to complete HTN + COPD assessment call in 3 months then f/u in 6 months with CP. Clinical Lead Review Review Adherence gaps identified?: yes Drug Therapy Problems identified?: No Assessment: Controlled  [name] [name], PharmD  Call review and upload to EMR 60mns 25secs

## 2022-06-06 ENCOUNTER — Ambulatory Visit (INDEPENDENT_AMBULATORY_CARE_PROVIDER_SITE_OTHER): Payer: Medicare HMO

## 2022-06-06 DIAGNOSIS — Z720 Tobacco use: Secondary | ICD-10-CM | POA: Diagnosis not present

## 2022-06-06 DIAGNOSIS — Z87891 Personal history of nicotine dependence: Secondary | ICD-10-CM

## 2022-06-06 DIAGNOSIS — E1122 Type 2 diabetes mellitus with diabetic chronic kidney disease: Secondary | ICD-10-CM | POA: Diagnosis not present

## 2022-06-06 DIAGNOSIS — N184 Chronic kidney disease, stage 4 (severe): Secondary | ICD-10-CM | POA: Diagnosis not present

## 2022-06-06 LAB — HAV, HBV, HCV
HCV Ab: NONREACTIVE
Hep B Core Total Ab: NEGATIVE
Hep B Surface Ab, Qual: REACTIVE
Hepatitis B Surface Ag: NEGATIVE
hep A Total Ab: NEGATIVE

## 2022-06-06 LAB — SPECIMEN STATUS REPORT

## 2022-06-06 LAB — HCV INTERPRETATION

## 2022-06-06 LAB — PSA, SERUM (SERIAL MONITOR): Prostate Specific Ag, Serum: 1.5 ng/mL (ref 0.0–4.0)

## 2022-06-14 ENCOUNTER — Other Ambulatory Visit: Payer: Self-pay | Admitting: Family

## 2022-06-14 DIAGNOSIS — N2581 Secondary hyperparathyroidism of renal origin: Secondary | ICD-10-CM | POA: Diagnosis not present

## 2022-06-14 DIAGNOSIS — E875 Hyperkalemia: Secondary | ICD-10-CM | POA: Diagnosis not present

## 2022-06-14 DIAGNOSIS — R809 Proteinuria, unspecified: Secondary | ICD-10-CM | POA: Diagnosis not present

## 2022-06-14 DIAGNOSIS — D631 Anemia in chronic kidney disease: Secondary | ICD-10-CM | POA: Diagnosis not present

## 2022-06-14 DIAGNOSIS — N184 Chronic kidney disease, stage 4 (severe): Secondary | ICD-10-CM | POA: Diagnosis not present

## 2022-06-14 DIAGNOSIS — I10 Essential (primary) hypertension: Secondary | ICD-10-CM | POA: Diagnosis not present

## 2022-06-14 DIAGNOSIS — E1122 Type 2 diabetes mellitus with diabetic chronic kidney disease: Secondary | ICD-10-CM | POA: Diagnosis not present

## 2022-06-15 ENCOUNTER — Ambulatory Visit (INDEPENDENT_AMBULATORY_CARE_PROVIDER_SITE_OTHER): Payer: Medicare HMO | Admitting: Family

## 2022-06-15 VITALS — BP 122/60 | HR 69 | Ht 65.0 in | Wt 200.2 lb

## 2022-06-15 DIAGNOSIS — I10 Essential (primary) hypertension: Secondary | ICD-10-CM | POA: Diagnosis not present

## 2022-06-15 NOTE — Progress Notes (Signed)
Established Patient Office Visit  Subjective:  Patient ID: [name] [name], male    DOB: 11-26-1945  Age: 77 y.o. MRN: [id number]  Chief Complaint  Patient presents with   Follow-up    High BP   Hypertension    Patient here today for hypertension issues.  He has been having elevated blood pressure at home and his wife was concerned. However, in office today his blood pressure is well controlled.   No other concerns today.   Hypertension This is a chronic problem. The current episode started more than 1 year ago. The problem has been waxing and waning since onset. The problem is controlled.     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 post. helix. 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   CAD (coronary artery disease)    CAP (community acquired pneumonia) 07/26/2020   CKD (chronic kidney disease), stage IV (HCC)    Complete tear of right rotator cuff 11/15/2015   Complex tear of lateral meniscus of left knee as current injury 12/06/2020   Complex tear of medial meniscus of left knee as current injury 10/18/2020   COPD (chronic obstructive pulmonary disease) ([place])    DM (diabetes mellitus), type 2 ([place]) 07/30/2020   DOE (dyspnea on exertion)    GERD (gastroesophageal reflux disease)    Heart murmur    HFrEF (heart failure with reduced ejection fraction) ([place])    History of hiatal hernia    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  Performed by Dr. Cheree Ditto on 08/01/16:  29 mm commercially available Medtronic Core Valve transcatheter aortic valve procedure placed via transfemoral approach by Dr. Cheree Ditto and co-surgeon Dr. Sheila Oats.   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 MOHS.   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/07/2021   Right zygoma, EDC 05/02/21   Supplemental oxygen dependent    3-4 L/   T2DM (type 2 diabetes mellitus) ([place])     Social History   Socioeconomic History   Marital status: Married    Spouse name: Keldon Airey   Number of children: 2   Years of education: Not on file   Highest education level: Not on file  Occupational History   Occupation: retired  Tobacco Use   Smoking status: Former    Packs/day: 2.00    Years: 54.00    Total pack years: 108.00    Types: Cigarettes    Quit date: 2010    Years since quitting: 14.1   Smokeless  tobacco: Former    Types: Chew    Quit date: 05/16/2004  Vaping Use   Vaping Use: Never used  Substance and Sexual Activity   Alcohol use: No   Drug use: Never   Sexual activity: Not on file  Other Topics Concern   Not on file  Social History Narrative   Not on file   Social Determinants of Health   Financial Resource Strain: Not on file  Food Insecurity: No Food Insecurity (02/14/2022)   Hunger Vital Sign    Worried About Running Out of Food in the Last Year: Never true    Ran Out of Food in the Last Year: Never true  Transportation Needs: No Transportation Needs (02/14/2022)   PRAPARE - Radiographer, therapeutic (Medical): No    Lack of Transportation (Non-Medical): No  Physical Activity: Not on file  Stress: Not on file  Social Connections: Not on file  Intimate Partner Violence: Not on file    Family History  Adopted: Yes  Problem Relation Age of Onset   Varicose Veins Daughter    Obesity Daughter    Hyperlipidemia Daughter    Diabetes Daughter    COPD Daughter    Depression Daughter    Asthma Daughter    Arthritis Son    Diabetes Son    Hypertension Son     Allergies  Allergen Reactions   Nsaids Other (See Comments)    Avoid due to kidney disease    Statins Other (See Comments)    Myalgias   Nexletol [Bempedoic Acid] Diarrhea    fatigue    Review of Systems  All other systems reviewed and are negative.      Objective:   BP 122/60   Pulse 69   Ht '5\' 5"'$  (1.651 m)   Wt 200 lb 3.2 oz (90.8 kg)   SpO2 90%   BMI 33.32 kg/m   Vitals:   06/15/22 1504  BP: 122/60  Pulse: 69  Height: '5\' 5"'$  (1.651 m)  Weight: 200 lb 3.2 oz (90.8 kg)  SpO2: 90%  BMI (Calculated): 33.31    Physical Exam Vitals and nursing note reviewed.  Constitutional:      Appearance: Normal appearance. He is obese.  Eyes:     Extraocular Movements: Extraocular movements intact.     Pupils: Pupils are equal, round, and reactive to light.  Neurological:     Mental Status: He is alert.      No results found for any visits on 06/15/22.  Recent Results (from the past 2160 hour(s))  Iron and TIBC([facility])     Status: None   Collection Time: 04/26/22 10:54 AM  Result Value Ref Range   Iron 79 45 - 182 ug/dL   TIBC 252 250 - 450 ug/dL   Saturation Ratios 31 17.9 - 39.5 %   UIBC 173 ug/dL    Comment: Performed at [facility], [address] [place], [place] [address]  Ferritin     Status: None   Collection Time: 04/26/22 10:54 AM  Result Value Ref Range   Ferritin 68 24 - 336 ng/mL    Comment: Performed at [facility], [address]  [place], [place] [address]  CBC with Differential/Platelet     Status: Abnormal   Collection Time: 05/25/22  3:58 PM  Result Value Ref Range   WBC 6.4 3.4 - 10.8 x10E3/uL   RBC 3.78 (L) 4.14 - 5.80 x10E6/uL   Hemoglobin 11.9 (L)  13.0 - 17.7 g/dL   Hematocrit 35.3 (L) 37.5 - 51.0 %   MCV 93 79 - 97 fL   MCH 31.5 26.6 - 33.0 pg   MCHC 33.7 31.5 - 35.7 g/dL   RDW 12.6 11.6 - 15.4 %   Platelets 125 (L) 150 - 450 x10E3/uL   Neutrophils 64 Not Estab. %   Lymphs 26 Not Estab. %   Monocytes 8 Not Estab. %   Eos 1 Not Estab. %   Basos 0 Not Estab. %   Neutrophils Absolute 4.1 1.4 - 7.0 x10E3/uL   Lymphocytes Absolute 1.7 0.7 - 3.1 x10E3/uL   Monocytes Absolute 0.5 0.1 - 0.9 x10E3/uL   EOS (ABSOLUTE) 0.0 0.0 - 0.4 x10E3/uL   Basophils Absolute 0.0 0.0 - 0.2 x10E3/uL   Immature Granulocytes 1 Not Estab. %   Immature Grans (Abs) 0.0 0.0 - 0.1 x10E3/uL  Comprehensive metabolic panel     Status: Abnormal   Collection Time: 05/25/22  3:58 PM  Result Value Ref Range   Glucose 89 70 - 99 mg/dL   BUN 42 (H) 8 - 27 mg/dL   Creatinine, Ser 2.81 (H) 0.76 - 1.27 mg/dL   eGFR 22 (L) >59 mL/min/1.73   BUN/Creatinine Ratio 15 10 - 24   Sodium 144 134 - 144 mmol/L   Potassium 5.3 (H) 3.5 - 5.2 mmol/L   Chloride 107 (H) 96 - 106 mmol/L   CO2 22 20 - 29 mmol/L   Calcium 8.9 8.6 - 10.2 mg/dL   Total Protein 6.6 6.0 - 8.5 g/dL   Albumin 3.6 (L) 3.8 - 4.8 g/dL   Globulin, Total 3.0 1.5 - 4.5 g/dL   Albumin/Globulin Ratio 1.2 1.2 - 2.2   Bilirubin Total 0.3 0.0 - 1.2 mg/dL   Alkaline Phosphatase 76 44 - 121 IU/L   AST 15 0 - 40 IU/L   ALT 7 0 - 44 IU/L  Lipid Panel w/o Chol/HDL Ratio     Status: None   Collection Time: 05/25/22  3:58 PM  Result Value Ref Range   Cholesterol, Total 100 100 - 199 mg/dL   Triglycerides 77 0 - 149 mg/dL   HDL 45 >39 mg/dL   VLDL Cholesterol Cal 16 5 - 40 mg/dL   LDL Chol Calc (NIH) 39 0 - 99 mg/dL  Hgb A1c w/o eAG     Status: None   Collection Time: 05/25/22  3:58 PM   Result Value Ref Range   Hgb A1c MFr Bld 5.5 4.8 - 5.6 %    Comment:          Prediabetes: 5.7 - 6.4          Diabetes: >6.4          Glycemic control for adults with diabetes: <7.0   VITAMIN D 25 Hydroxy (Vit-D Deficiency, Fractures)     Status: None   Collection Time: 05/25/22  3:58 PM  Result Value Ref Range   Vit D, 25-Hydroxy 32.0 30.0 - 100.0 ng/mL    Comment: Vitamin D deficiency has been defined by the Institute of Medicine and an Endocrine Society practice guideline as a level of serum 25-OH vitamin D less than 20 ng/mL (1,2). The Endocrine Society went on to further define vitamin D insufficiency as a level between 21 and 29 ng/mL (2). 1. IOM (Institute of Medicine). 2010. Dietary reference    intakes for calcium and D. [place]: The    [employer]. 2. Holick MF, [place], Bischoff-Ferrari  HA, et al.    Evaluation, treatment, and prevention of vitamin D    deficiency: an Endocrine Society clinical practice    guideline. JCEM. 2011 Jul; 96(7):1911-30.   HAV, HBV, HCV     Status: None   Collection Time: 05/25/22  3:58 PM  Result Value Ref Range   hep A Total Ab Negative Negative    Comment: Comment: The HAV total antibody assay detects both IgG and IgM but does not differentiate between them. A negative result suggests susceptibility to infection. A positive result could be due to vaccination, previously resolved infection or active infection. Testing for HAV IgM should be performed if active HAV infection is suspected. [facility] offers profiles that will automatically reflex positive HAV total antibody results to IgM (e.g., panel [phone] HAV Antibody w/ Rfx).    Hepatitis B Surface Ag Negative Negative   Hep B Surface Ab, Qual Reactive     Comment:               Non Reactive: Inconsistent with immunity,                             less than 10 mIU/mL               Reactive:     Consistent with immunity,                             greater than 9.9  mIU/mL **Verified by repeat analysis**    Hep B Core Total Ab Negative Negative   Rfx to HBc IgM Comment     Comment: Reflex criteria was not met.   Interpretation Comment     Comment:                    HBV Serology Interpretation Chart  -------------------------------------------------------------------  Interpretation             HBsAg  anti-HBs  anti-HBc  anti-HBc IgM  -------------------------------------------------------------------  Key - Analyte present: + Analyte absent: - Test not indicated: TNI  -------------------------------------------------------------------  Susceptible (never  infected and no evidence    -        -         -          TNI  of vaccination)  -------------------------------------------------------------------  Immune due to natural  resolved infection          -        +         +          TNI  -------------------------------------------------------------------  Immune due to vaccination   -        +         -          TNI  -------------------------------------------------------------------  Acute Infection             +        -         +           +  -------------------------------------------------------------------   Chronic infection           +        -         +           -  -------------------------------------------------------------------  Interpretation unclear*     -        -         +          +/-  -------------------------------------------------------------------  *  Multiple possibilities: resolved infection (most common); false-   positive anti-HBc (susceptible); "low-level" chronic infection";   resolving acute infection.    HCV Ab Non Reactive Non Reactive  Interpretation:     Status: None   Collection Time: 05/25/22  3:58 PM  Result Value Ref Range   HCV Interp 1: Comment     Comment: Not infected with HCV unless early or acute infection is suspected (which may be delayed in an immunocompromised individual), or other evidence  exists to indicate HCV infection.   PSA, SERUM (SERIAL MONITOR)     Status: None   Collection Time: 05/25/22  3:58 PM  Result Value Ref Range   Prostate Specific Ag, Serum 1.5 0.0 - 4.0 ng/mL    Comment: Roche ECLIA methodology. According to the American Urological Association, Serum PSA should decrease and remain at undetectable levels after radical prostatectomy. The AUA defines biochemical recurrence as an initial PSA value 0.2 ng/mL or greater followed by a subsequent confirmatory PSA value 0.2 ng/mL or greater. Values obtained with different assay methods or kits cannot be used interchangeably. Results cannot be interpreted as absolute evidence of the presence or absence of malignant disease.   Specimen status report     Status: None   Collection Time: 05/25/22  3:58 PM  Result Value Ref Range   specimen status report Comment     Comment: Written Authorization Written Authorization Written Authorization Received. Authorization received from [employer] 05-28-2022 Logged by Marcelino Duster McLawhorn   POCT glucose (manual entry)     Status: Abnormal   Collection Time: 05/29/22 11:29 AM  Result Value Ref Range   POC Glucose 174 (A) 70 - 99 mg/dl  POCT UA - Microalbumin     Status: Abnormal   Collection Time: 05/29/22 11:32 AM  Result Value Ref Range   Microalbumin Ur, POC 150 mg/L   Creatinine, POC 50 mg/dL   Albumin/Creatinine Ratio, Urine, POC >300       Assessment & Plan:   Problem List Items Addressed This Visit   None   No follow-ups on file.   Total time spent: 20 minutes  [name], FNP  06/15/2022

## 2022-06-16 ENCOUNTER — Encounter: Payer: Self-pay | Admitting: Family

## 2022-06-16 MED ORDER — NYSTATIN 100000 UNIT/GM EX OINT: 1.0000 | TOPICAL_OINTMENT | Freq: Two times a day (BID) | CUTANEOUS | 0 refills | Status: AC

## 2022-06-22 DIAGNOSIS — E1122 Type 2 diabetes mellitus with diabetic chronic kidney disease: Secondary | ICD-10-CM | POA: Diagnosis not present

## 2022-06-22 DIAGNOSIS — I129 Hypertensive chronic kidney disease with stage 1 through stage 4 chronic kidney disease, or unspecified chronic kidney disease: Secondary | ICD-10-CM | POA: Diagnosis not present

## 2022-06-22 DIAGNOSIS — N184 Chronic kidney disease, stage 4 (severe): Secondary | ICD-10-CM | POA: Diagnosis not present

## 2022-06-22 DIAGNOSIS — I2510 Atherosclerotic heart disease of native coronary artery without angina pectoris: Secondary | ICD-10-CM | POA: Diagnosis not present

## 2022-06-23 ENCOUNTER — Telehealth: Payer: Self-pay

## 2022-06-23 MED ORDER — GUAIFENESIN AC 100-10 MG/5ML PO SYRP: 5.0000 mL | ORAL_SOLUTION | Freq: Three times a day (TID) | ORAL | 0 refills | Status: DC | PRN

## 2022-06-23 MED ORDER — LEVOFLOXACIN 500 MG PO TABS: 500.0000 mg | ORAL_TABLET | Freq: Every day | ORAL | 0 refills | Status: AC

## 2022-06-23 NOTE — Telephone Encounter (Signed)
Talked to [name] [name], pt has chest congestion/cough for a few days, had a fever a few days ago but not today. She asked if there's something you can send for him? Especially for cough, he has tried tessalon pearls but haven't helped, please advise  [name] [name] also mentioned she was starting to get a cough as well*

## 2022-06-26 DIAGNOSIS — H353221 Exudative age-related macular degeneration, left eye, with active choroidal neovascularization: Secondary | ICD-10-CM | POA: Diagnosis not present

## 2022-07-03 DIAGNOSIS — M19072 Primary osteoarthritis, left ankle and foot: Secondary | ICD-10-CM | POA: Diagnosis not present

## 2022-07-03 DIAGNOSIS — B351 Tinea unguium: Secondary | ICD-10-CM | POA: Diagnosis not present

## 2022-07-03 DIAGNOSIS — M79672 Pain in left foot: Secondary | ICD-10-CM | POA: Diagnosis not present

## 2022-07-03 DIAGNOSIS — L603 Nail dystrophy: Secondary | ICD-10-CM | POA: Diagnosis not present

## 2022-07-03 DIAGNOSIS — M109 Gout, unspecified: Secondary | ICD-10-CM | POA: Diagnosis not present

## 2022-07-03 DIAGNOSIS — L84 Corns and callosities: Secondary | ICD-10-CM | POA: Diagnosis not present

## 2022-07-03 DIAGNOSIS — M79675 Pain in left toe(s): Secondary | ICD-10-CM | POA: Diagnosis not present

## 2022-07-03 DIAGNOSIS — M19071 Primary osteoarthritis, right ankle and foot: Secondary | ICD-10-CM | POA: Diagnosis not present

## 2022-07-03 DIAGNOSIS — E1142 Type 2 diabetes mellitus with diabetic polyneuropathy: Secondary | ICD-10-CM | POA: Diagnosis not present

## 2022-07-03 DIAGNOSIS — M79671 Pain in right foot: Secondary | ICD-10-CM | POA: Diagnosis not present

## 2022-07-03 DIAGNOSIS — M79674 Pain in right toe(s): Secondary | ICD-10-CM | POA: Diagnosis not present

## 2022-07-05 ENCOUNTER — Telehealth: Payer: Self-pay

## 2022-07-05 NOTE — Telephone Encounter (Signed)
HTN Review Call  [name],[name]  48 years, Male  DOB: [birth year]  M:   __________________________________________________ Hypertension Review (HC) Chart Review BP #1 reading (last): 122/60 on: 06/15/2022 BP #2 reading: 132/70 on: 05/29/2022 BP #3 reading: 103/54 on: 05/17/2022 Any of the last 3 BP > 140/90 mmHg?: No What recent interventions have been made by any provider to improve the patient's conditions in the last 3 months?: Consults: 06/14/22 Nephrology  [name] Legato, MD. For follow-up. No medication changes. Office Visit: 06/15/22 [name] [name]. NP-C For follow-up. No medication changes. (06/16/22 STARTED Nystatin 0000000 Unit/GM 1 application topical 2 times daily.  Has there been any documented recent hospitalizations or ED visits since last visit with Clinical Lead?: No Adherence Review Does the [facility] have access to medication refill data?: Yes Adherence rates for STAR metric medications: Ezetimibe 10 mg - 06/02/22 90 DS Losartan 50 mg - 06/14/22 90 DS Adherence rates for medications indicated for disease state being reviewed: Losartan 50 mg - 06/14/22 90 DS Does the patient have >5 day gap between last estimated fill dates for any of the above medications?: No Disease State Questions Able to connect with the Patient?: Yes Is the patient monitoring his/her BP?: No Review recommendations from CPP's note of how often patient should be checking and encourage monitoring blood pressures if patient has history of high BP.: Done What is your blood pressure goal?: 120/80 Educate patient to inform proper points on checking BP at home:: Do not drink caffeine or smoke a cigarette at least 30 min. prior to checking., Make sure using the right size cuff, the length of the cuff's bladder should be at least equal to 75% of the circumference of the upper arm. What diet changes have you made to improve your Blood Pressure Control?: eating more home-cooked meals What exercise are you doing  to improve your Blood Pressure Control?: no formal exercise Misc. Response/Information:: 176/76 - 07/03/22 per patients wife from the orthopedics office. (He was also in pain) Engagement Notes [name] [name] on 07/05/2022 09:44 AM [facility] Chart Review: 15 min 07/05/22 HC Assessment call time spent: 5 min 07/05/22   Clinical Lead Review Review Adherence gaps identified?: No Drug Therapy Problems identified?: No Assessment: Controlled Engagement Notes [name] [name] on 07/05/2022 04:45 PM Reviewed 2 mins

## 2022-07-07 ENCOUNTER — Other Ambulatory Visit: Payer: Self-pay | Admitting: Family

## 2022-07-10 DIAGNOSIS — Z961 Presence of intraocular lens: Secondary | ICD-10-CM | POA: Diagnosis not present

## 2022-07-10 DIAGNOSIS — H353211 Exudative age-related macular degeneration, right eye, with active choroidal neovascularization: Secondary | ICD-10-CM | POA: Diagnosis not present

## 2022-07-12 ENCOUNTER — Other Ambulatory Visit: Payer: Self-pay | Admitting: Family

## 2022-07-23 DIAGNOSIS — E1122 Type 2 diabetes mellitus with diabetic chronic kidney disease: Secondary | ICD-10-CM | POA: Diagnosis not present

## 2022-07-23 DIAGNOSIS — N184 Chronic kidney disease, stage 4 (severe): Secondary | ICD-10-CM | POA: Diagnosis not present

## 2022-07-23 DIAGNOSIS — I129 Hypertensive chronic kidney disease with stage 1 through stage 4 chronic kidney disease, or unspecified chronic kidney disease: Secondary | ICD-10-CM | POA: Diagnosis not present

## 2022-07-24 ENCOUNTER — Other Ambulatory Visit: Payer: Self-pay | Admitting: Cardiovascular Disease

## 2022-07-24 DIAGNOSIS — L603 Nail dystrophy: Secondary | ICD-10-CM | POA: Diagnosis not present

## 2022-07-24 DIAGNOSIS — E1142 Type 2 diabetes mellitus with diabetic polyneuropathy: Secondary | ICD-10-CM | POA: Diagnosis not present

## 2022-07-24 DIAGNOSIS — L851 Acquired keratosis [keratoderma] palmaris et plantaris: Secondary | ICD-10-CM | POA: Diagnosis not present

## 2022-07-24 DIAGNOSIS — M79671 Pain in right foot: Secondary | ICD-10-CM | POA: Diagnosis not present

## 2022-07-24 DIAGNOSIS — B351 Tinea unguium: Secondary | ICD-10-CM | POA: Diagnosis not present

## 2022-07-24 DIAGNOSIS — L84 Corns and callosities: Secondary | ICD-10-CM | POA: Diagnosis not present

## 2022-07-24 DIAGNOSIS — M19071 Primary osteoarthritis, right ankle and foot: Secondary | ICD-10-CM | POA: Diagnosis not present

## 2022-07-24 DIAGNOSIS — I351 Nonrheumatic aortic (valve) insufficiency: Secondary | ICD-10-CM

## 2022-07-24 DIAGNOSIS — M79674 Pain in right toe(s): Secondary | ICD-10-CM | POA: Diagnosis not present

## 2022-07-25 ENCOUNTER — Other Ambulatory Visit: Payer: Self-pay | Admitting: Cardiovascular Disease

## 2022-08-07 ENCOUNTER — Ambulatory Visit: Payer: Medicare HMO | Admitting: Cardiovascular Disease

## 2022-08-08 DIAGNOSIS — M159 Polyosteoarthritis, unspecified: Secondary | ICD-10-CM | POA: Diagnosis not present

## 2022-08-08 DIAGNOSIS — Z79899 Other long term (current) drug therapy: Secondary | ICD-10-CM | POA: Diagnosis not present

## 2022-08-08 DIAGNOSIS — M10372 Gout due to renal impairment, left ankle and foot: Secondary | ICD-10-CM | POA: Diagnosis not present

## 2022-08-08 DIAGNOSIS — E1122 Type 2 diabetes mellitus with diabetic chronic kidney disease: Secondary | ICD-10-CM | POA: Diagnosis not present

## 2022-08-08 DIAGNOSIS — N184 Chronic kidney disease, stage 4 (severe): Secondary | ICD-10-CM | POA: Diagnosis not present

## 2022-08-10 DIAGNOSIS — N050 Unspecified nephritic syndrome with minor glomerular abnormality: Secondary | ICD-10-CM | POA: Diagnosis not present

## 2022-08-10 DIAGNOSIS — E1122 Type 2 diabetes mellitus with diabetic chronic kidney disease: Secondary | ICD-10-CM | POA: Diagnosis not present

## 2022-08-10 DIAGNOSIS — I129 Hypertensive chronic kidney disease with stage 1 through stage 4 chronic kidney disease, or unspecified chronic kidney disease: Secondary | ICD-10-CM | POA: Diagnosis not present

## 2022-08-10 DIAGNOSIS — N2581 Secondary hyperparathyroidism of renal origin: Secondary | ICD-10-CM | POA: Diagnosis not present

## 2022-08-10 DIAGNOSIS — R809 Proteinuria, unspecified: Secondary | ICD-10-CM | POA: Diagnosis not present

## 2022-08-10 DIAGNOSIS — D631 Anemia in chronic kidney disease: Secondary | ICD-10-CM | POA: Diagnosis not present

## 2022-08-10 DIAGNOSIS — N184 Chronic kidney disease, stage 4 (severe): Secondary | ICD-10-CM | POA: Diagnosis not present

## 2022-08-16 ENCOUNTER — Telehealth: Payer: Self-pay

## 2022-08-16 DIAGNOSIS — R0600 Dyspnea, unspecified: Secondary | ICD-10-CM

## 2022-08-16 DIAGNOSIS — R059 Cough, unspecified: Secondary | ICD-10-CM

## 2022-08-16 NOTE — Telephone Encounter (Signed)
Malachi Bonds called and left vm regarding pt, said he's been sick since yesterday- has a bad cough, coughing so much his chest hurts & did have a fever late yesterday. Do you want him to do a covid test or chest xray? Please advise

## 2022-08-17 ENCOUNTER — Ambulatory Visit (INDEPENDENT_AMBULATORY_CARE_PROVIDER_SITE_OTHER): Payer: Medicare HMO

## 2022-08-17 DIAGNOSIS — I361 Nonrheumatic tricuspid (valve) insufficiency: Secondary | ICD-10-CM

## 2022-08-17 DIAGNOSIS — R059 Cough, unspecified: Secondary | ICD-10-CM | POA: Diagnosis not present

## 2022-08-17 DIAGNOSIS — I351 Nonrheumatic aortic (valve) insufficiency: Secondary | ICD-10-CM

## 2022-08-17 DIAGNOSIS — I340 Nonrheumatic mitral (valve) insufficiency: Secondary | ICD-10-CM | POA: Diagnosis not present

## 2022-08-17 DIAGNOSIS — R0600 Dyspnea, unspecified: Secondary | ICD-10-CM

## 2022-08-17 MED ORDER — DOXYCYCLINE HYCLATE 100 MG PO CAPS: 100.0000 mg | ORAL_CAPSULE | Freq: Two times a day (BID) | ORAL | 7 refills | Status: DC

## 2022-08-17 NOTE — Telephone Encounter (Signed)
Patient came for x-ray on 4/25. See result message for follow up.

## 2022-08-20 ENCOUNTER — Other Ambulatory Visit: Payer: Self-pay | Admitting: Cardiovascular Disease

## 2022-08-21 ENCOUNTER — Ambulatory Visit (INDEPENDENT_AMBULATORY_CARE_PROVIDER_SITE_OTHER): Payer: Medicare HMO | Admitting: Cardiovascular Disease

## 2022-08-21 ENCOUNTER — Encounter: Payer: Self-pay | Admitting: Cardiovascular Disease

## 2022-08-21 VITALS — BP 128/70 | HR 73 | Ht 66.0 in | Wt 201.8 lb

## 2022-08-21 DIAGNOSIS — I2510 Atherosclerotic heart disease of native coronary artery without angina pectoris: Secondary | ICD-10-CM

## 2022-08-21 DIAGNOSIS — I5020 Unspecified systolic (congestive) heart failure: Secondary | ICD-10-CM | POA: Diagnosis not present

## 2022-08-21 DIAGNOSIS — I10 Essential (primary) hypertension: Secondary | ICD-10-CM

## 2022-08-21 DIAGNOSIS — E782 Mixed hyperlipidemia: Secondary | ICD-10-CM

## 2022-08-21 DIAGNOSIS — H353221 Exudative age-related macular degeneration, left eye, with active choroidal neovascularization: Secondary | ICD-10-CM | POA: Diagnosis not present

## 2022-08-21 MED ORDER — LOSARTAN POTASSIUM 50 MG PO TABS
50.0000 mg | ORAL_TABLET | Freq: Every day | ORAL | 2 refills | Status: DC
Start: 2022-08-21 — End: 2022-09-14

## 2022-08-21 MED ORDER — ENTRESTO 24-26 MG PO TABS
1.0000 | ORAL_TABLET | Freq: Two times a day (BID) | ORAL | 3 refills | Status: DC
Start: 2022-08-21 — End: 2022-08-21

## 2022-08-21 MED ORDER — TORSEMIDE 20 MG PO TABS
20.0000 mg | ORAL_TABLET | Freq: Every day | ORAL | 1 refills | Status: DC
Start: 2022-08-21 — End: 2022-08-23

## 2022-08-21 NOTE — Patient Instructions (Addendum)
Change furosemide to torsemide.

## 2022-08-21 NOTE — Assessment & Plan Note (Signed)
Previous echo 2023 EF 48%, echo 07/2022 EF 38%. Patient complaining of worsening shortness of breath. Unable to change losartan to [facility] due to decreased kidney function. Change furosemide to torsemide for better diuresis. Will order a stress test to check for blockages as a cause of decreased heart function.

## 2022-08-21 NOTE — Progress Notes (Signed)
Cardiology Office Note   Date:  08/21/2022   ID:  [name], [name] 12/30/1945, MRN [id number]  PCP:  Miki Kins, FNP  Cardiologist:  Adrian Blackwater, MD      History of Present Illness: [name] [name] is a 77 y.o. male who presents for  Chief Complaint  Patient presents with   Follow-up    Follow up, Echo results.    Patient in office for routine cardiac exam, discuss echo results. Denies chest pain, edema, palpitations. Shortness of breath, on 3 LO2 [place].    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 post. helix. 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   CAD (coronary artery disease)    CAP (community acquired pneumonia) 07/26/2020   CKD (chronic kidney disease), stage IV (HCC)    Complete tear of right rotator cuff 11/15/2015   Complex tear of lateral meniscus of left knee as current injury 12/06/2020   Complex tear of medial meniscus of left knee as current injury 10/18/2020   COPD (chronic obstructive pulmonary disease) (HCC)    DM (diabetes mellitus), type 2 (HCC) 07/30/2020   DOE (dyspnea on exertion)    GERD (gastroesophageal reflux disease)    Heart murmur    HFrEF (heart failure with reduced ejection fraction) (HCC)    History of hiatal hernia    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  Performed by Dr. Zebedee Iba on 08/01/16:  29 mm commercially available Medtronic Core Valve transcatheter aortic valve procedure placed via transfemoral approach by Dr. Zebedee Iba and co-surgeon Dr. Winona Legato.   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 MOHS.   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/07/2021   Right zygoma, EDC 05/02/21   Supplemental oxygen dependent    3-4 L/[place]   T2DM (type 2 diabetes mellitus) (HCC)      Past Surgical History:  Procedure Laterality Date   AORTIC VALVE REPLACEMENT N/A 08/01/2016   29 mm CoreValve Evolut; Location: [facility]; Surgeon: Clent Jacks, MD   CARDIAC CATHETERIZATION N/A 12/26/2012   2v CAD; retained pigtail in myocardium --> transferred to [facility]; Location: [facility]; Surgeon: Despina Hick, MD   COLONOSCOPY     KNEE ARTHROSCOPY WITH MEDIAL MENISECTOMY Left 12/02/2020   Procedure: KNEE ARTHROSCOPY WITH DEBRIDEMENT AND PARTIAL MEDIAL AND LATERAL MENISECTOMY;  Surgeon: Christena Flake, MD;  Location: [facility] ORS;  Service: Orthopedics;  Laterality: Left;   LITHOTRIPSY     PERCUTANEOUS REMOVAL INTRA-AORTIC BALLOON CATH N/A 12/26/2012   Procedure:  PERCUTANEOUS REMOVAL INTRA-AORTIC BALLOON CATH; Surgeon: Heloise Ochoa, MD; Location: [facility]; Service: Cardiothoracic   RIGHT HEART CATH Right 07/13/2016   Location: [facility]; Surgeon: Emilio Math, MD   TRANSESOPHAGEAL ECHOCARDIOGRAM N/A 12/26/2012   Procedure: TRANSESOPHAGEAL ECHOCARDIOGRAPHY; Surgeon: Heloise Ochoa, MD; Location: [facility]; Service: Cardiothoracic   UMBILICAL HERNIA REPAIR N/A 02/13/2014   Procedure: LAPAROSCOPIC UMBILICAL HERNIA REPAIR; Surgeon: Merlinda Frederick, MD; Location: [facility]; Service: General Surgery    Current Outpatient Medications  Medication Sig Dispense Refill   acetaminophen (TYLENOL) 650 MG CR tablet Take 1,300 mg by mouth every 8 (eight) hours as needed for pain.     albuterol (VENTOLIN HFA) 108 (90 Base) MCG/ACT inhaler Inhale 2 puffs into the lungs every 6 (six) hours as needed for wheezing or shortness of breath.     Alcohol Swabs (DROPSAFE ALCOHOL PREP) 70 % PADS Apply 1 each topically as needed (With repatha and blood sugar checks.).     allopurinol (ZYLOPRIM) 100 MG tablet Take 50 mg by mouth daily.     Ascorbic Acid (VITAMIN C) 1000 MG tablet Take 1,000 mg by mouth at bedtime.     aspirin EC 81 MG EC tablet Take 1 tablet (81 mg total) by mouth daily.     busPIRone (BUSPAR) 5 MG tablet Take 5 mg by mouth 2 (two) times daily as needed (Anxiety).     calcitRIOL (ROCALTROL) 0.25 MCG capsule Take 0.25 mcg by mouth daily.     citalopram (CELEXA) 40 MG tablet Take 40 mg by mouth every morning.     cyclobenzaprine (FLEXERIL) 5 MG tablet Take 5 mg by mouth at bedtime as needed for muscle spasms (or cramping in legs.  May half tablet if needed).     doxycycline (VIBRAMYCIN) 100 MG capsule Take 1 capsule (100 mg total) by mouth 2 (two) times daily. 14 capsule 7   Evolocumab (REPATHA SURECLICK) 140 MG/ML SOAJ INJECT CONTENT OF 1 CARTRIDGE UNDER THE SKIN EVERY OTHER WEEK 6 mL 1   ezetimibe (ZETIA) 10 MG tablet TAKE 1 TABLET(10 MG) BY MOUTH AT BEDTIME 90 tablet 3   fexofenadine (ALLEGRA) 180 MG tablet Take 180 mg by mouth daily as needed for allergies or rhinitis.     fluticasone (FLONASE) 50 MCG/ACT nasal spray Place 2 sprays into both nostrils daily.  2   Fluticasone-Umeclidin-Vilant (TRELEGY ELLIPTA) 100-62.5-25 MCG/ACT AEPB Inhale 100 mcg into the lungs daily at 6 (six) AM.     gabapentin (NEURONTIN) 400 MG capsule Take 400 mg by mouth at bedtime.     guaiFENesin-codeine (GUAIFENESIN AC) 100-10 MG/5ML syrup Take 5 mLs by mouth 3 (three) times daily as  needed for cough. 120 mL 0   ipratropium-albuterol (DUONEB) 0.5-2.5 (3) MG/3ML SOLN Take 3 mLs by nebulization every 4 (four) hours as needed. 360 mL 3   losartan (COZAAR) 50 MG tablet Take 1 tablet (50 mg total) by mouth daily. 30 tablet 2   metoprolol succinate (TOPROL-XL) 50 MG 24 hr tablet Take 50 mg by mouth every morning. Take with or immediately following a meal.     Multiple Vitamins-Minerals (PRESERVISION AREDS) CAPS Take by mouth.     OXYGEN Inhale 3 L into the lungs See admin instructions. Used as needed throughout the day and continuous at bedtime     pantoprazole (PROTONIX) 40 MG tablet Take 40 mg by mouth every morning.     Simethicone (GAS-X PO) Take 1 tablet by mouth 2 (two) times daily as needed (  flatulence).     sodium chloride (OCEAN) 0.65 % SOLN nasal spray Place 1 spray into both nostrils as needed for congestion.  0   SUPER B COMPLEX/C PO Take 1 tablet by mouth daily.     tamsulosin (FLOMAX) 0.4 MG CAPS capsule TAKE 1 CAPSULE EVERY DAY 90 capsule 3   torsemide (DEMADEX) 20 MG tablet Take 1 tablet (20 mg total) by mouth daily. 30 tablet 1   Current Facility-Administered Medications  Medication Dose Route Frequency Provider Last Rate Last Admin   ipratropium-albuterol (DUONEB) 0.5-2.5 (3) MG/3ML nebulizer solution 3 mL  3 mL Nebulization Q6H PRN Yevonne Pax, MD        Allergies:   Nsaids, Statins, and Nexletol [bempedoic acid]    Social History:   reports that he quit smoking about 14 years ago. His smoking use included cigarettes. He has a 108.00 pack-year smoking history. He quit smokeless tobacco use about 18 years ago.  His smokeless tobacco use included chew. He reports that he does not drink alcohol and does not use drugs.   Family History:  family history includes Arthritis in his son; Asthma in his daughter; COPD in his daughter; Depression in his daughter; Diabetes in his daughter and son; Hyperlipidemia in his daughter; Hypertension in his son; Obesity in his  daughter; Varicose Veins in his daughter. He was adopted.    ROS:     Review of Systems  Constitutional: Negative.   HENT: Negative.    Eyes: Negative.   Respiratory: Negative.    Gastrointestinal: Negative.   Genitourinary: Negative.   Musculoskeletal: Negative.   Skin: Negative.   Neurological: Negative.   Endo/Heme/Allergies: Negative.   Psychiatric/Behavioral: Negative.    All other systems reviewed and are negative.   All other systems are reviewed and negative.   PHYSICAL EXAM: VS:  BP 128/70   Pulse 73   Ht 5\' 6"  (1.676 m)   Wt 201 lb 12.8 oz (91.5 kg)   SpO2 97%   BMI 32.57 kg/m  , BMI Body mass index is 32.57 kg/m. Last weight:  Wt Readings from Last 3 Encounters:  08/21/22 201 lb 12.8 oz (91.5 kg)  06/15/22 200 lb 3.2 oz (90.8 kg)  05/29/22 200 lb (90.7 kg)   Physical Exam Vitals reviewed.  Constitutional:      Appearance: Normal appearance. He is normal weight.  HENT:     Head: Normocephalic.     Nose: Nose normal.     Mouth/Throat:     Mouth: Mucous membranes are moist.  Eyes:     Pupils: Pupils are equal, round, and reactive to light.  Cardiovascular:     Rate and Rhythm: Normal rate and regular rhythm.     Pulses: Normal pulses.     Heart sounds: Normal heart sounds.  Pulmonary:     Effort: Pulmonary effort is normal.  Abdominal:     General: Abdomen is flat. Bowel sounds are normal.  Musculoskeletal:        General: Normal range of motion.     Cervical back: Normal range of motion.  Skin:    General: Skin is warm.  Neurological:     General: No focal deficit present.     Mental Status: He is alert.  Psychiatric:        Mood and Affect: Mood normal.     EKG: none today  Recent Labs: 02/22/2022: TSH 2.88 05/25/2022: ALT 7; BUN 42; Creatinine, Ser 2.81; Hemoglobin 11.9; Platelets 125; Potassium 5.3; Sodium  144    Lipid Panel    Component Value Date/Time   CHOL 100 05/25/2022 1558   CHOL 100 07/26/2012 0111   TRIG 77 05/25/2022  1558   TRIG 157 07/26/2012 0111   HDL 45 05/25/2022 1558   HDL 35 (L) 07/26/2012 0111   VLDL 31 07/26/2012 0111   LDLCALC 39 05/25/2022 1558   LDLCALC 34 07/26/2012 0111     Other studies Reviewed: Patient: 601.0 [name] [name] DOB:  17-Jan-1946  Date:  07/18/2021 10:30 Provider: Adrian Blackwater MD Encounter: ECHO   Page 2 REASON FOR VISIT  Visit for: Echocardiogram/I 34.0  Sex:   Male  wt=204    lbs.  BP=112/68  Height=65    inches.   TESTS  Imaging: Echocardiogram:  An echocardiogram in (2-d) mode was performed and in Doppler mode with color flow velocity mapping was performed. The aortic valve cusps are abnormal 1.7  cm, flow velocity 2.03   m/s, and systolic calculated mean flow gradient 10  mmHg. Mitral valve diastolic peak flow velocity E 1.61   m/s and E/A ratio 0.8. Aortic root diameter 2.6   cm. The LVOT internal diameter 2.4  cm and flow velocity was abnormal .97   m/s. LV systolic dimension 4.06   cm, diastolic 5.38   cm, posterior wall thickness 1.79    cm, fractional shortening 24.5 %, and EF 48.2 %. IVS thickness 1.69   cm. LA dimension 4.7 cm. Mitral Valve has Mild Regurgitation. Aortic Valve has Mild Regurgitation. Pulmonic Valve has Trace Regurgitation. Tricuspid Valve has Mild Regurgitation.     ASSESSMENT  Technically adequate study.  Mild left ventricular systolic dysfunction.  Mild left ventricular hypertrophy with GRADE 3 (restrictive physiology) diastolic dysfunction.  Normal right ventricular systolic function.  Normal right ventricular diastolic function.  Normal left ventricular wall motion.  Normal right ventricular wall motion.  Trace pulmonary regurgitation.  Mild tricuspid regurgitation.  Trace pulmonary hypertension.  Normal pulmonary artery pressure.  Mild mitral regurgitation.  Mild aortic regurgitation.  No pericardial effusion.  Moderately dilated Left atrium  Mildly dilated Right atrium  Mildly dilated Left and Right  ventricle  Normal Ao root and ascending Ao  Mild wall motion changes  Mild LVH.   THERAPY   Referring physician: Laurier Nancy  Sonographer: Adrian Blackwater.  Adrian Blackwater MD  Electronically signed by: Adrian Blackwater     Date: 07/19/2021 09:05  Patient: 601.0 - [name] [name] DOB:  11/06/1945  Date:  09/06/2020 08:00 Provider: Adrian Blackwater MD Encounter: NUCLEAR STRESS TEST   Page 1 TESTS    [facility] [address] [place], [place] [address] [phone] STUDY:  Gated Stress / Rest Myocardial Perfusion Imaging Tomographic (SPECT) Including attenuation correction Wall Motion, Left Ventricular Ejection Fraction By Gated Technique.Persantine Stress Test. SEX: Male   WEIGHT: 197 lbs   HEIGHT: 65 in           ARMS UP: YES/NO  REFERRING PHYSICIAN: Dr.Jayel Inks [name]                                                                                                                                                                                                                       INDICATION FOR STUDY:  CP                                                                                                                                                                                                                   TECHNIQUE:  Approximately 20 minutes following the intravenous administration of 10.2 mCi of Tc-55m Sestamibi after stress testing in a reclined supine position with arms above their head if able to do so, gated SPECT imaging of the heart was performed. After about a 2hr break, the patient was injected intravenously with 32.1 mCi of Tc-33m Sestamibi.  Approximately 45 minutes later in the same position as stress imaging SPECT rest imaging of the heart was performed.  STRESS BY:  Adrian Blackwater,  MD PROTOCOL:  Adenosine  DOSE ADMINISTERED: 16.5 ml   ROUTE OF ADMINISTRATION: IV  MAX PRED HR: 144                     85%:  122              75%:  108                                                                                                                  RESTING BP: 124/70  RESTING HR: 59   PEAK BP: 112/64    PEAK HR: 65                                                                    EXERCISE DURATION:    4 min injection                                            REASON FOR TEST TERMINATION:    Protocol end                                                                                                                              SYMPTOMS:   None  EKG RESULTS: Sinus bradycardia. 59/min. No significant ST changes with adenosine.                                                             IMAGE QUALITY:  Fair                                                                                                                                                                                                                                                                                                                                PERFUSION/WALL MOTION FINDINGS: EF = 58%. Large severe fixed basal inferior, mid inferior and apical inferior wall defects, normal wall motion.                                                                          IMPRESSION: Equivocal stress test with normal LVEF, advise CCTA.  Adrian Blackwater, MD Stress Interpreting Physician / Nuclear Interpreting Physician  Adrian Blackwater MD  Electronically signed by: Adrian Blackwater     Date: 09/09/2020 13:14  Patient: 601.0 - [name] [name] DOB:  06-11-1945  Date:  07/05/2012 08:00 Provider: [name], MD, Analise Glotfelty Encounter: ALL ANGIOGRAMS (CTA BRAIN, CAROTIDS, RENAL ARTERIES, PE)   Page 2 REASON FOR VISIT  Referred by [name].   TESTS  Imaging: Computed Tomographic Angiography:  Cardiac multidetector CT was performed paying particular attention to the coronary arteries for the diagnosis of: Abnormal stress test, CAD, SOB. Diagnostic Drugs:  Administered iohexol (Omnipaque) through an antecubital vein and images from the examination were analyzed for the presence and extent of coronary artery disease, using 3D image processing software. 100 mL of non-ionic contrast (Omnipaque) was used.   ASSESSMENT   The left main artery was abnormal 2  The proximal LAD artery are abnormal 2  The mid-LAD artery was abnormal 2  The distal LAD artery was abnormal 2  The proximal circumflex artery was abnormal 2  The distal circumflex artery was abnormal 2  The first obtuse marginal branch artery (OM-1) was abnormal 2  The mid right coronary artery (RCA) was abnormal severely calcified  The distal right coronary artery (RCA) was abnormal severely calcified     TEST CONCLUSIONS  1 - Calcium score is 2365.  2 - Mild disease in LCX, LAD, and RCA but is severely calcified.  Adrian Blackwater, MD  Electronically signed by: Adrian Blackwater     Date: 07/12/2012 11:51   ASSESSMENT AND PLAN:    ICD-10-CM   1. HFrEF (heart failure with reduced ejection fraction) (HCC)  I50.20 torsemide (DEMADEX) 20 MG tablet    DISCONTINUED: sacubitril-valsartan (ENTRESTO) 24-26 MG    2. Coronary artery disease involving native coronary artery of native heart without angina pectoris  I25.10 MYOCARDIAL  PERFUSION IMAGING    3. Essential hypertension  I10 losartan (COZAAR) 50 MG tablet    4. Mixed hyperlipidemia  E78.2        Problem List Items Addressed This Visit       Cardiovascular and Mediastinum   Essential hypertension    Well controlled. Continue same medications       Relevant Medications   losartan (COZAAR) 50 MG tablet   torsemide (DEMADEX) 20 MG tablet   Coronary artery disease    Patient denies chest pain. Worsening shortness of breath. Last stress test 2 years ago, will repeat.       Relevant Medications   losartan (COZAAR) 50 MG tablet   torsemide (DEMADEX) 20 MG tablet   Other Relevant Orders   MYOCARDIAL PERFUSION IMAGING   HFrEF (heart failure with reduced ejection fraction) (HCC) - Primary    Previous echo 2023 EF 48%, echo 07/2022 EF 38%. Patient complaining of worsening shortness of breath. Unable to change losartan to [facility] due to decreased kidney function. Change furosemide to torsemide for better diuresis. Will order a stress test to check for blockages as a cause of decreased heart function.       Relevant Medications   losartan (COZAAR) 50 MG tablet   torsemide (DEMADEX) 20 MG tablet     Other   Hyperlipidemia    05/2022 LDL 39, continue Repatha.      Relevant Medications   losartan (COZAAR) 50 MG tablet   torsemide (DEMADEX) 20 MG tablet     Disposition:   Return in about 2 weeks (around 09/04/2022) for after stress test.  Total time spent: 30 minutes  Signed,  Adrian Blackwater, MD  08/21/2022 1:23 PM    [facility]

## 2022-08-21 NOTE — Assessment & Plan Note (Signed)
05/2022 LDL 39, continue Repatha.

## 2022-08-21 NOTE — Assessment & Plan Note (Signed)
Well controlled. Continue same medications. 

## 2022-08-21 NOTE — Assessment & Plan Note (Signed)
Patient denies chest pain. Worsening shortness of breath. Last stress test 2 years ago, will repeat.

## 2022-08-22 ENCOUNTER — Other Ambulatory Visit: Payer: Self-pay | Admitting: Family

## 2022-08-22 ENCOUNTER — Ambulatory Visit: Payer: Medicare HMO | Admitting: Cardiovascular Disease

## 2022-08-23 ENCOUNTER — Other Ambulatory Visit: Payer: Medicare HMO

## 2022-08-23 ENCOUNTER — Other Ambulatory Visit: Payer: Self-pay | Admitting: Cardiology

## 2022-08-23 ENCOUNTER — Other Ambulatory Visit: Payer: Self-pay | Admitting: Family

## 2022-08-23 DIAGNOSIS — E1122 Type 2 diabetes mellitus with diabetic chronic kidney disease: Secondary | ICD-10-CM | POA: Diagnosis not present

## 2022-08-23 DIAGNOSIS — I5020 Unspecified systolic (congestive) heart failure: Secondary | ICD-10-CM

## 2022-08-23 DIAGNOSIS — E039 Hypothyroidism, unspecified: Secondary | ICD-10-CM | POA: Diagnosis not present

## 2022-08-23 DIAGNOSIS — D509 Iron deficiency anemia, unspecified: Secondary | ICD-10-CM

## 2022-08-23 DIAGNOSIS — E785 Hyperlipidemia, unspecified: Secondary | ICD-10-CM | POA: Diagnosis not present

## 2022-08-23 DIAGNOSIS — D631 Anemia in chronic kidney disease: Secondary | ICD-10-CM

## 2022-08-23 MED ORDER — TORSEMIDE 20 MG PO TABS: 20.0000 mg | ORAL_TABLET | Freq: Every day | ORAL | 1 refills | Status: DC

## 2022-08-24 LAB — CBC WITH DIFFERENTIAL
Basophils Absolute: 0 10*3/uL (ref 0.0–0.2)
Basos: 0 %
EOS (ABSOLUTE): 0 10*3/uL (ref 0.0–0.4)
Eos: 0 %
Hematocrit: 34.4 % — ABNORMAL LOW (ref 37.5–51.0)
Hemoglobin: 11.5 g/dL — ABNORMAL LOW (ref 13.0–17.7)
Immature Grans (Abs): 0.2 10*3/uL — ABNORMAL HIGH (ref 0.0–0.1)
Immature Granulocytes: 2 %
Lymphocytes Absolute: 1.8 10*3/uL (ref 0.7–3.1)
Lymphs: 21 %
MCH: 31.8 pg (ref 26.6–33.0)
MCHC: 33.4 g/dL (ref 31.5–35.7)
MCV: 95 fL (ref 79–97)
Monocytes Absolute: 0.7 10*3/uL (ref 0.1–0.9)
Monocytes: 8 %
Neutrophils Absolute: 6.1 10*3/uL (ref 1.4–7.0)
Neutrophils: 69 %
RBC: 3.62 x10E6/uL — ABNORMAL LOW (ref 4.14–5.80)
RDW: 13.9 % (ref 11.6–15.4)
WBC: 8.8 10*3/uL (ref 3.4–10.8)

## 2022-08-24 LAB — CMP14+EGFR
ALT: 7 IU/L (ref 0–44)
AST: 13 IU/L (ref 0–40)
Albumin/Globulin Ratio: 1.2 (ref 1.2–2.2)
Albumin: 3.8 g/dL (ref 3.8–4.8)
Alkaline Phosphatase: 99 IU/L (ref 44–121)
BUN/Creatinine Ratio: 13 (ref 10–24)
BUN: 45 mg/dL — ABNORMAL HIGH (ref 8–27)
Bilirubin Total: 0.4 mg/dL (ref 0.0–1.2)
CO2: 19 mmol/L — ABNORMAL LOW (ref 20–29)
Calcium: 9.1 mg/dL (ref 8.6–10.2)
Chloride: 107 mmol/L — ABNORMAL HIGH (ref 96–106)
Creatinine, Ser: 3.36 mg/dL — ABNORMAL HIGH (ref 0.76–1.27)
Globulin, Total: 3.1 g/dL (ref 1.5–4.5)
Glucose: 112 mg/dL — ABNORMAL HIGH (ref 70–99)
Potassium: 5.4 mmol/L — ABNORMAL HIGH (ref 3.5–5.2)
Sodium: 145 mmol/L — ABNORMAL HIGH (ref 134–144)
Total Protein: 6.9 g/dL (ref 6.0–8.5)
eGFR: 18 mL/min/{1.73_m2} — ABNORMAL LOW (ref >59)

## 2022-08-24 LAB — HEMOGLOBIN A1C
Est. average glucose Bld gHb Est-mCnc: 111 mg/dL
Hgb A1c MFr Bld: 5.5 % (ref 4.8–5.6)

## 2022-08-24 LAB — LIPID PANEL
Chol/HDL Ratio: 2.5 ratio (ref 0.0–5.0)
Cholesterol, Total: 91 mg/dL — ABNORMAL LOW (ref 100–199)
HDL: 36 mg/dL — ABNORMAL LOW (ref >39)
LDL Chol Calc (NIH): 31 mg/dL (ref 0–99)
Triglycerides: 136 mg/dL (ref 0–149)
VLDL Cholesterol Cal: 24 mg/dL (ref 5–40)

## 2022-08-24 LAB — TSH: TSH: 5.01 u[IU]/mL — ABNORMAL HIGH (ref 0.450–4.500)

## 2022-08-28 ENCOUNTER — Ambulatory Visit (INDEPENDENT_AMBULATORY_CARE_PROVIDER_SITE_OTHER): Payer: Medicare HMO | Admitting: Family

## 2022-08-28 VITALS — BP 130/62 | HR 79 | Ht 66.0 in | Wt 197.2 lb

## 2022-08-28 DIAGNOSIS — I739 Peripheral vascular disease, unspecified: Secondary | ICD-10-CM

## 2022-08-28 DIAGNOSIS — I5020 Unspecified systolic (congestive) heart failure: Secondary | ICD-10-CM

## 2022-08-28 DIAGNOSIS — E782 Mixed hyperlipidemia: Secondary | ICD-10-CM

## 2022-08-28 DIAGNOSIS — E1122 Type 2 diabetes mellitus with diabetic chronic kidney disease: Secondary | ICD-10-CM

## 2022-08-28 DIAGNOSIS — E1165 Type 2 diabetes mellitus with hyperglycemia: Secondary | ICD-10-CM

## 2022-08-28 DIAGNOSIS — J449 Chronic obstructive pulmonary disease, unspecified: Secondary | ICD-10-CM | POA: Diagnosis not present

## 2022-08-28 DIAGNOSIS — N184 Chronic kidney disease, stage 4 (severe): Secondary | ICD-10-CM

## 2022-08-28 DIAGNOSIS — M10372 Gout due to renal impairment, left ankle and foot: Secondary | ICD-10-CM

## 2022-08-28 DIAGNOSIS — N2581 Secondary hyperparathyroidism of renal origin: Secondary | ICD-10-CM

## 2022-08-28 DIAGNOSIS — J9611 Chronic respiratory failure with hypoxia: Secondary | ICD-10-CM | POA: Diagnosis not present

## 2022-08-28 DIAGNOSIS — R946 Abnormal results of thyroid function studies: Secondary | ICD-10-CM | POA: Diagnosis not present

## 2022-08-28 LAB — POCT CBG (FASTING - GLUCOSE)-MANUAL ENTRY: Glucose Fasting, POC: 172 mg/dL — AB (ref 70–99)

## 2022-08-28 NOTE — Progress Notes (Signed)
Established Patient Office Visit  Subjective:  Patient ID: [name] [name], male    DOB: 1946-03-25  Age: 77 y.o. MRN: [id number]  Chief Complaint  Patient presents with  . Follow-up    3 month follow up    HPI  No other concerns at this time.   Past Medical History:  Diagnosis Date  . Accidental cut, puncture, perforation, or hemorrhage during heart catheterization 12/27/2012  . Acute postoperative pain 08/02/2016  . Acute renal failure (ARF) (HCC)   . AKI (acute kidney injury) (HCC) 07/27/2020  . ANA positive 07/12/2015  . 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 Arthritis   . Basal cell carcinoma 01/04/2010   Right sup. post. helix. Excised 03/02/2010  . BCC (basal cell carcinoma of skin) 11/24/2020   R neck below the ear, EDC  . Benign hypertensive kidney disease with chronic kidney disease 02/24/2019  . CAD (coronary artery disease)   . CAP (community acquired pneumonia) 07/26/2020  . CKD (chronic kidney disease), stage IV (HCC)   . Complete tear of right rotator cuff 11/15/2015  . Complex tear of lateral meniscus of left knee as current injury 12/06/2020  . Complex tear of medial meniscus of left knee as current injury 10/18/2020  . COPD (chronic obstructive pulmonary disease) (HCC)   . DM (diabetes mellitus), type 2 (HCC) 07/30/2020  . DOE (dyspnea on exertion)   . GERD (gastroesophageal reflux disease)   . Heart murmur   . HFrEF (heart failure with reduced ejection fraction) (HCC)   . History of hiatal hernia   . 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 HLD (hyperlipidemia)   . Hyperkalemia   . Hypertension   . Kidney stones   . Migraines   . OSA on CPAP   . Personal history of other malignant neoplasm of skin 04/08/2012  . Pneumonia 07/2020  . Recurrent nephrolithiasis 12/27/2012  . Respiratory failure (HCC) 07/05/2016  . Rotator cuff tendinitis, right 11/15/2015  . 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  Performed by Dr. Zebedee Iba on 08/01/16:  29 mm commercially available Medtronic Core Valve transcatheter aortic valve procedure placed via transfemoral approach by Dr. Zebedee Iba and co-surgeon Dr. Winona Legato.  . 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 MOHS.  Marland Kitchen 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/07/2021   Right zygoma, EDC 05/02/21  . Supplemental oxygen dependent    3-4 L/  . T2DM (type 2 diabetes mellitus) (HCC)     Past Surgical History:  Procedure Laterality Date  . AORTIC VALVE REPLACEMENT N/A 08/01/2016   29 mm CoreValve Evolut; Location: [facility]; Surgeon: Clent Jacks, MD  . CARDIAC CATHETERIZATION N/A 12/26/2012   2v CAD; retained pigtail in myocardium --> transferred to [name]; Location: [facility]; Surgeon: Despina Hick, MD  . COLONOSCOPY    . KNEE ARTHROSCOPY WITH MEDIAL MENISECTOMY Left 12/02/2020   Procedure: KNEE ARTHROSCOPY WITH DEBRIDEMENT AND PARTIAL MEDIAL AND LATERAL MENISECTOMY;  Surgeon: Christena Flake, MD;  Location: [facility]S;  Service: Orthopedics;  Laterality: Left;  . LITHOTRIPSY    . PERCUTANEOUS REMOVAL INTRA-AORTIC BALLOON CATH N/A 12/26/2012   Procedure: PERCUTANEOUS REMOVAL INTRA-AORTIC BALLOON CATH; Surgeon: Heloise Ochoa, MD; Location: [facility]; Service: Cardiothoracic  . RIGHT HEART CATH Right 07/13/2016   Location: [facility]; Surgeon: Emilio Math, MD  . TRANSESOPHAGEAL ECHOCARDIOGRAM N/A 12/26/2012   Procedure: TRANSESOPHAGEAL ECHOCARDIOGRAPHY; Surgeon: Jorge Ny  Oleh Genin, MD; Location: [facility]; Service: Cardiothoracic  . UMBILICAL HERNIA REPAIR N/A 02/13/2014   Procedure: LAPAROSCOPIC UMBILICAL HERNIA REPAIR; Surgeon: Merlinda Frederick, MD; Location: [facility]; Service: General Surgery    Social History   Socioeconomic History  . Marital status: Married    Spouse name: Elige Stoves  . Number of children: 2  . Years of  education: Not on file  . Highest education level: Not on file  Occupational History  . Occupation: retired  Tobacco Use  . Smoking status: Former    Packs/day: 2.00    Years: 54.00    Additional pack years: 0.00    Total pack years: 108.00    Types: Cigarettes    Quit date: 2010    Years since quitting: 14.3  . Smokeless tobacco: Former    Types: Chew    Quit date: 05/16/2004  Vaping Use  . Vaping Use: Never used  Substance and Sexual Activity  . Alcohol use: No  . Drug use: Never  . Sexual activity: Not on file  Other Topics Concern  . Not on file  Social History Narrative  . Not on file   Social Determinants of Health   Financial Resource Strain: Not on file  Food Insecurity: No Food Insecurity (02/14/2022)   Hunger Vital Sign   . Worried About Programme researcher, broadcasting/film/video in the Last Year: Never true   . Ran Out of Food in the Last Year: Never true  Transportation Needs: No Transportation Needs (02/14/2022)   PRAPARE - Transportation   . Lack of Transportation (Medical): No   . Lack of Transportation (Non-Medical): No  Physical Activity: Not on file  Stress: Not on file  Social Connections: Not on file  Intimate Partner Violence: Not on file    Family History  Adopted: Yes  Problem Relation Age of Onset  . Varicose Veins Daughter   . Obesity Daughter   . Hyperlipidemia Daughter   . Diabetes Daughter   . COPD Daughter   . Depression Daughter   . Asthma Daughter   . Arthritis Son   . Diabetes Son   . Hypertension Son     Allergies  Allergen Reactions  . Nsaids Other (See Comments)    Avoid due to kidney disease   . Statins Other (See Comments)    Myalgias  . Nexletol [Bempedoic Acid] Diarrhea    fatigue    ROS     Objective:   BP 130/62   Pulse 79   Ht 5\' 6"  (1.676 m)   Wt 197 lb 3.2 oz (89.4 kg)   SpO2 99%   BMI 31.83 kg/m   Vitals:   08/28/22 1002  BP: 130/62  Pulse: 79  Height: 5\' 6"  (1.676 m)  Weight: 197 lb 3.2 oz (89.4 kg)  SpO2:  99%  BMI (Calculated): 31.84    Physical Exam   Results for orders placed or performed in visit on 08/28/22  POCT CBG (Fasting - Glucose)  Result Value Ref Range   Glucose Fasting, POC 172 (A) 70 - 99 mg/dL    Recent Results (from the past 2160 hour(s))  TSH     Status: Abnormal   Collection Time: 08/23/22 10:56 AM  Result Value Ref Range   TSH 5.010 (H) 0.450 - 4.500 uIU/mL  Lipid panel     Status: Abnormal   Collection Time: 08/23/22 10:56 AM  Result Value Ref Range   Cholesterol, Total 91 (L) 100 - 199 mg/dL  Triglycerides 136 0 - 149 mg/dL   HDL 36 (L) >40 mg/dL   VLDL Cholesterol Cal 24 5 - 40 mg/dL   LDL Chol Calc (NIH) 31 0 - 99 mg/dL   Chol/HDL Ratio 2.5 0.0 - 5.0 ratio    Comment:                                   T. Chol/HDL Ratio                                             Men  Women                               1/2 Avg.Risk  3.4    3.3                                   Avg.Risk  5.0    4.4                                2X Avg.Risk  9.6    7.1                                3X Avg.Risk 23.4   11.0   Hemoglobin A1c     Status: None   Collection Time: 08/23/22 10:56 AM  Result Value Ref Range   Hgb A1c MFr Bld 5.5 4.8 - 5.6 %    Comment:          Prediabetes: 5.7 - 6.4          Diabetes: >6.4          Glycemic control for adults with diabetes: <7.0    Est. average glucose Bld gHb Est-mCnc 111 mg/dL  [id number]     Status: Abnormal   Collection Time: 08/23/22 10:56 AM  Result Value Ref Range   Glucose 112 (H) 70 - 99 mg/dL   BUN 45 (H) 8 - 27 mg/dL   Creatinine, Ser 2.95 (H) 0.76 - 1.27 mg/dL   eGFR 18 (L) >62 ZH/YQM/5.78   BUN/Creatinine Ratio 13 10 - 24   Sodium 145 (H) 134 - 144 mmol/L   Potassium 5.4 (H) 3.5 - 5.2 mmol/L   Chloride 107 (H) 96 - 106 mmol/L   CO2 19 (L) 20 - 29 mmol/L   Calcium 9.1 8.6 - 10.2 mg/dL   Total Protein 6.9 6.0 - 8.5 g/dL   Albumin 3.8 3.8 - 4.8 g/dL   Globulin, Total 3.1 1.5 - 4.5 g/dL   Albumin/Globulin Ratio 1.2  1.2 - 2.2   Bilirubin Total 0.4 0.0 - 1.2 mg/dL   Alkaline Phosphatase 99 44 - 121 IU/L   AST 13 0 - 40 IU/L   ALT 7 0 - 44 IU/L  CBC With Differential     Status: Abnormal   Collection Time: 08/23/22 10:56 AM  Result Value Ref Range   WBC 8.8 3.4 - 10.8 x10E3/uL   RBC 3.62 (L) 4.14 - 5.80 x10E6/uL   Hemoglobin 11.5 (L) 13.0 - 17.7 g/dL  Hematocrit 34.4 (L) 37.5 - 51.0 %   MCV 95 79 - 97 fL   MCH 31.8 26.6 - 33.0 pg   MCHC 33.4 31.5 - 35.7 g/dL   RDW 16.1 09.6 - 04.5 %   Neutrophils 69 Not Estab. %   Lymphs 21 Not Estab. %   Monocytes 8 Not Estab. %   Eos 0 Not Estab. %   Basos 0 Not Estab. %   Neutrophils Absolute 6.1 1.4 - 7.0 x10E3/uL   Lymphocytes Absolute 1.8 0.7 - 3.1 x10E3/uL   Monocytes Absolute 0.7 0.1 - 0.9 x10E3/uL   EOS (ABSOLUTE) 0.0 0.0 - 0.4 x10E3/uL   Basophils Absolute 0.0 0.0 - 0.2 x10E3/uL   Immature Granulocytes 2 Not Estab. %   Immature Grans (Abs) 0.2 (H) 0.0 - 0.1 x10E3/uL    Comment: (An elevated percentage of Immature Granulocytes has not been found to be clinically significant as a sole clinical predictor of disease. Does NOT include bands or blast cells.  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   Total time spent: {AMA time spent:29001}  minutes  Miki Kins, FNP  08/28/2022

## 2022-08-29 ENCOUNTER — Encounter: Payer: Self-pay | Admitting: Family

## 2022-08-29 ENCOUNTER — Ambulatory Visit (INDEPENDENT_AMBULATORY_CARE_PROVIDER_SITE_OTHER): Payer: Medicare HMO

## 2022-08-29 DIAGNOSIS — I2510 Atherosclerotic heart disease of native coronary artery without angina pectoris: Secondary | ICD-10-CM | POA: Diagnosis not present

## 2022-08-29 LAB — TSH+T4F+T3FREE
Free T4: 1.06 ng/dL (ref 0.82–1.77)
T3, Free: 2.3 pg/mL (ref 2.0–4.4)
TSH: 2.82 u[IU]/mL (ref 0.450–4.500)

## 2022-08-29 LAB — URIC ACID: Uric Acid: 9.1 mg/dL — ABNORMAL HIGH (ref 3.8–8.4)

## 2022-08-29 MED ORDER — TECHNETIUM TC 99M SESTAMIBI GENERIC - CARDIOLITE
10.2000 | Freq: Once | INTRAVENOUS | Status: AC | PRN
Start: 2022-08-29 — End: 2022-08-29
  Administered 2022-08-29: 10.2 via INTRAVENOUS

## 2022-08-29 MED ORDER — TECHNETIUM TC 99M SESTAMIBI GENERIC - CARDIOLITE
33.8000 | Freq: Once | INTRAVENOUS | Status: AC | PRN
  Administered 2022-08-29: 33.8 via INTRAVENOUS

## 2022-08-29 NOTE — Assessment & Plan Note (Signed)
A1C is well controlled, 5.5. Continue current dietary changes and other therapy.   Will reassess at follow up.

## 2022-08-29 NOTE — Assessment & Plan Note (Signed)
Patient not currently having issues with this, but he does report that he has been having additional swelling. Torsemide continued.

## 2022-08-29 NOTE — Assessment & Plan Note (Signed)
Patient sees Dr. Luther Redo for his CKD. Will continue to defer to him for treatment.   Continue current care.

## 2022-08-29 NOTE — Assessment & Plan Note (Addendum)
Lipids well controlled Continue current therapy for lipid control. Will modify as needed based on labwork results.

## 2022-08-29 NOTE — Assessment & Plan Note (Signed)
Patient is under the care of cardiology.  Has stress test scheduled.  Will defer to cardiology for treatment.   Continue current meds

## 2022-08-29 NOTE — Assessment & Plan Note (Signed)
Patient sees pulmonology.  He is currently well controlled and has been doing well with his oxygen.  Suspect his SOB is because of heart not lung issues.  Defer to Pulmonary for recommendations.   Continue current treatment. 

## 2022-08-29 NOTE — Assessment & Plan Note (Signed)
>>  ASSESSMENT AND PLAN FOR TYPE 2 DIABETES MELLITUS WITH DIABETIC CHRONIC KIDNEY DISEASE (HCC) WRITTEN ON 08/29/2022  5:28 PM BY Leroi Haque M, FNP  A1C is well controlled, 5.5. Continue current dietary changes and other therapy.   Will reassess at follow up.

## 2022-08-29 NOTE — Assessment & Plan Note (Signed)
Patient sees Dr. Kollaru for his CKD. Will continue to defer to him for treatment.   Continue current care.  

## 2022-08-29 NOTE — Assessment & Plan Note (Signed)
Patient sees pulmonology.  He is currently well controlled and has been doing well with his oxygen.  Suspect his SOB is because of heart not lung issues.  Defer to Pulmonary for recommendations.   Continue current treatment.

## 2022-08-30 ENCOUNTER — Other Ambulatory Visit: Payer: Self-pay | Admitting: Family

## 2022-09-01 ENCOUNTER — Telehealth: Payer: Self-pay

## 2022-09-01 NOTE — Telephone Encounter (Signed)
Malachi Bonds called and left vm regarding pt's lab results, please advise

## 2022-09-04 ENCOUNTER — Ambulatory Visit: Payer: Medicare HMO

## 2022-09-04 DIAGNOSIS — H2512 Age-related nuclear cataract, left eye: Secondary | ICD-10-CM | POA: Diagnosis not present

## 2022-09-04 DIAGNOSIS — M19072 Primary osteoarthritis, left ankle and foot: Secondary | ICD-10-CM | POA: Diagnosis not present

## 2022-09-04 DIAGNOSIS — H353211 Exudative age-related macular degeneration, right eye, with active choroidal neovascularization: Secondary | ICD-10-CM | POA: Diagnosis not present

## 2022-09-04 DIAGNOSIS — L603 Nail dystrophy: Secondary | ICD-10-CM | POA: Diagnosis not present

## 2022-09-04 DIAGNOSIS — E1142 Type 2 diabetes mellitus with diabetic polyneuropathy: Secondary | ICD-10-CM | POA: Diagnosis not present

## 2022-09-04 DIAGNOSIS — L84 Corns and callosities: Secondary | ICD-10-CM | POA: Diagnosis not present

## 2022-09-04 DIAGNOSIS — M79674 Pain in right toe(s): Secondary | ICD-10-CM | POA: Diagnosis not present

## 2022-09-04 DIAGNOSIS — Z0100 Encounter for examination of eyes and vision without abnormal findings: Secondary | ICD-10-CM | POA: Diagnosis not present

## 2022-09-04 DIAGNOSIS — H2513 Age-related nuclear cataract, bilateral: Secondary | ICD-10-CM | POA: Diagnosis not present

## 2022-09-04 DIAGNOSIS — M19071 Primary osteoarthritis, right ankle and foot: Secondary | ICD-10-CM | POA: Diagnosis not present

## 2022-09-04 DIAGNOSIS — H2511 Age-related nuclear cataract, right eye: Secondary | ICD-10-CM | POA: Diagnosis not present

## 2022-09-04 DIAGNOSIS — M79671 Pain in right foot: Secondary | ICD-10-CM | POA: Diagnosis not present

## 2022-09-04 DIAGNOSIS — H353221 Exudative age-related macular degeneration, left eye, with active choroidal neovascularization: Secondary | ICD-10-CM | POA: Diagnosis not present

## 2022-09-04 DIAGNOSIS — B351 Tinea unguium: Secondary | ICD-10-CM | POA: Diagnosis not present

## 2022-09-05 ENCOUNTER — Other Ambulatory Visit: Payer: Self-pay | Admitting: Family

## 2022-09-05 ENCOUNTER — Encounter: Payer: Self-pay | Admitting: Podiatry

## 2022-09-05 ENCOUNTER — Other Ambulatory Visit: Payer: Self-pay | Admitting: Podiatry

## 2022-09-05 ENCOUNTER — Ambulatory Visit (INDEPENDENT_AMBULATORY_CARE_PROVIDER_SITE_OTHER): Payer: Medicare HMO | Admitting: Cardiovascular Disease

## 2022-09-05 ENCOUNTER — Encounter: Payer: Self-pay | Admitting: Cardiovascular Disease

## 2022-09-05 VITALS — BP 132/70 | HR 71 | Ht 65.0 in | Wt 201.4 lb

## 2022-09-05 DIAGNOSIS — I5020 Unspecified systolic (congestive) heart failure: Secondary | ICD-10-CM | POA: Diagnosis not present

## 2022-09-05 DIAGNOSIS — I10 Essential (primary) hypertension: Secondary | ICD-10-CM | POA: Diagnosis not present

## 2022-09-05 DIAGNOSIS — I2510 Atherosclerotic heart disease of native coronary artery without angina pectoris: Secondary | ICD-10-CM

## 2022-09-05 DIAGNOSIS — E782 Mixed hyperlipidemia: Secondary | ICD-10-CM

## 2022-09-05 DIAGNOSIS — M19072 Primary osteoarthritis, left ankle and foot: Secondary | ICD-10-CM

## 2022-09-05 MED ORDER — TORSEMIDE 20 MG PO TABS: 20.0000 mg | ORAL_TABLET | Freq: Every day | ORAL | 1 refills | Status: DC

## 2022-09-05 NOTE — Assessment & Plan Note (Addendum)
Stress test 08/2022 Ischemia in the RCA territory with borderline LVEF dysfunction, advise CCTA. Kidney function decreased, unable to do CCTA. Patient denies chest pain. Will treat medically.

## 2022-09-05 NOTE — Progress Notes (Signed)
Cardiology Office Note   Date:  09/05/2022   ID:  [name], [name] 07-03-1945, MRN [id number]  PCP:  Miki Kins, FNP  Cardiologist:  Adrian Blackwater, MD      History of Present Illness: [name] [name] is a 77 y.o. male who presents for  Chief Complaint  Patient presents with   Follow-up    ECHO/NST results    Patient in office to discuss results of stress test. Denies chest pain. Shortness of breath, on 3 L O2 via [place].     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 post. helix. 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   CAD (coronary artery disease)    CAP (community acquired pneumonia) 07/26/2020   CKD (chronic kidney disease), stage IV (HCC)    Complete tear of right rotator cuff 11/15/2015   Complex tear of lateral meniscus of left knee as current injury 12/06/2020   Complex tear of medial meniscus of left knee as current injury 10/18/2020   COPD (chronic obstructive pulmonary disease) (HCC)    DM (diabetes mellitus), type 2 (HCC) 07/30/2020   DOE (dyspnea on exertion)    GERD (gastroesophageal reflux disease)    Heart murmur    HFrEF (heart failure with reduced ejection fraction) (HCC)    History of hiatal hernia    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  Performed by Dr. Zebedee Iba on 08/01/16:  29 mm commercially available Medtronic Core Valve transcatheter aortic valve procedure placed via transfemoral approach by Dr. Zebedee Iba and co-surgeon Dr. Winona Legato.   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 MOHS.   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/07/2021   Right zygoma, EDC 05/02/21   Supplemental oxygen dependent    3-4 [place]   T2DM (type 2 diabetes mellitus) (HCC)      Past Surgical History:  Procedure Laterality Date   AORTIC VALVE REPLACEMENT N/A 08/01/2016   29 mm CoreValve Evolut; Location: [facility]; Surgeon: Clent Jacks, MD   CARDIAC CATHETERIZATION N/A 12/26/2012   2v CAD; retained pigtail in myocardium --> transferred to [facility]; Location: [facility]; Surgeon: Despina Hick, MD   COLONOSCOPY     KNEE ARTHROSCOPY WITH MEDIAL MENISECTOMY Left 12/02/2020   Procedure: KNEE ARTHROSCOPY WITH DEBRIDEMENT AND PARTIAL MEDIAL AND LATERAL MENISECTOMY;  Surgeon: Christena Flake, MD;  Location: [facility];  Service: Orthopedics;  Laterality: Left;   LITHOTRIPSY     PERCUTANEOUS REMOVAL INTRA-AORTIC BALLOON CATH N/A 12/26/2012   Procedure: PERCUTANEOUS REMOVAL  INTRA-AORTIC BALLOON CATH; Surgeon: Heloise Ochoa, MD; Location: [facility]; Service: Cardiothoracic   RIGHT HEART CATH Right 07/13/2016   Location: [facility]; Surgeon: Emilio Math, MD   TRANSESOPHAGEAL ECHOCARDIOGRAM N/A 12/26/2012   Procedure: TRANSESOPHAGEAL ECHOCARDIOGRAPHY; Surgeon: Heloise Ochoa, MD; Location: [facility]; Service: Cardiothoracic   UMBILICAL HERNIA REPAIR N/A 02/13/2014   Procedure: LAPAROSCOPIC UMBILICAL HERNIA REPAIR; Surgeon: Merlinda Frederick, MD; Location: [facility]; Service: General  Surgery     Current Outpatient Medications  Medication Sig Dispense Refill   acetaminophen (TYLENOL) 650 MG CR tablet Take 1,300 mg by mouth every 8 (eight) hours as needed for pain.     albuterol (VENTOLIN HFA) 108 (90 Base) MCG/ACT inhaler Inhale 2 puffs into the lungs every 6 (six) hours as needed for wheezing or shortness of breath.     Alcohol Swabs (DROPSAFE ALCOHOL PREP) 70 % PADS Apply 1 each topically as needed (With repatha and blood sugar checks.).     allopurinol (ZYLOPRIM) 100 MG tablet Take 50 mg by mouth daily.     Ascorbic Acid (VITAMIN C) 1000 MG tablet Take 1,000 mg by mouth at bedtime.     aspirin EC 81 MG EC tablet Take 1 tablet (81 mg total) by mouth daily.     busPIRone (BUSPAR) 5 MG tablet Take 5 mg by mouth 2 (two) times daily as needed (Anxiety).     calcitRIOL (ROCALTROL) 0.25 MCG capsule Take 0.25 mcg by mouth daily.     citalopram (CELEXA) 40 MG tablet TAKE 1 TABLET EVERY DAY 90 tablet 3   cyclobenzaprine (FLEXERIL) 5 MG tablet Take 5 mg by mouth at bedtime as needed for muscle spasms (or cramping in legs.  May half tablet if needed).     Evolocumab (REPATHA SURECLICK) 140 MG/ML SOAJ INJECT CONTENT OF 1 CARTRIDGE UNDER THE SKIN EVERY OTHER WEEK 6 mL 1   ezetimibe (ZETIA) 10 MG tablet TAKE 1 TABLET(10 MG) BY MOUTH AT BEDTIME 90 tablet 3   fexofenadine (ALLEGRA) 180 MG tablet Take 180 mg by mouth daily as needed for allergies or rhinitis.     fluticasone (FLONASE) 50 MCG/ACT nasal spray Place 2 sprays into both nostrils daily.  2   Fluticasone-Umeclidin-Vilant (TRELEGY ELLIPTA) 100-62.5-25 MCG/ACT AEPB Inhale 100 mcg into the lungs daily at 6 (six) AM.     gabapentin (NEURONTIN) 400 MG capsule TAKE 1 CAPSULE AT BEDTIME 90 capsule 3   guaiFENesin-codeine (GUAIFENESIN AC) 100-10 MG/5ML syrup Take 5 mLs by mouth 3 (three) times daily as needed for cough. 120 mL 0   ipratropium-albuterol (DUONEB) 0.5-2.5 (3) MG/3ML SOLN Take 3 mLs by nebulization every 4 (four) hours as  needed. 360 mL 3   losartan (COZAAR) 50 MG tablet Take 1 tablet (50 mg total) by mouth daily. 30 tablet 2   metoprolol succinate (TOPROL-XL) 50 MG 24 hr tablet Take 50 mg by mouth every morning. Take with or immediately following a meal.     Multiple Vitamins-Minerals (PRESERVISION AREDS) CAPS Take by mouth.     OXYGEN Inhale 3 L into the lungs See admin instructions. Used as needed throughout the day and continuous at bedtime     pantoprazole (PROTONIX) 40 MG tablet Take 40 mg by mouth every morning.     Simethicone (GAS-X PO) Take 1 tablet by mouth 2 (two) times daily as needed (flatulence).     sodium chloride (OCEAN) 0.65 % SOLN nasal spray Place 1 spray into both nostrils as needed for congestion.  0  SUPER B COMPLEX/C PO Take 1 tablet by mouth daily.     tamsulosin (FLOMAX) 0.4 MG CAPS capsule TAKE 1 CAPSULE EVERY DAY 90 capsule 3   torsemide (DEMADEX) 20 MG tablet Take 1 tablet (20 mg total) by mouth daily. 90 tablet 1   No current facility-administered medications for this visit.    Allergies:   Nsaids, Statins, and Nexletol [bempedoic acid]    Social History:   reports that he quit smoking about 14 years ago. His smoking use included cigarettes. He has a 108.00 pack-year smoking history. He quit smokeless tobacco use about 18 years ago.  His smokeless tobacco use included chew. He reports that he does not drink alcohol and does not use drugs.   Family History:  family history includes Arthritis in his son; Asthma in his daughter; COPD in his daughter; Depression in his daughter; Diabetes in his daughter and son; Hyperlipidemia in his daughter; Hypertension in his son; Obesity in his daughter; Varicose Veins in his daughter. He was adopted.    ROS:     Review of Systems  Constitutional: Negative.   HENT: Negative.    Eyes: Negative.   Respiratory:  Positive for shortness of breath.   Cardiovascular: Negative.   Gastrointestinal: Negative.   Genitourinary: Negative.    Musculoskeletal: Negative.   Skin: Negative.   Neurological: Negative.   Endo/Heme/Allergies: Negative.   Psychiatric/Behavioral: Negative.    All other systems reviewed and are negative.     All other systems are reviewed and negative.    PHYSICAL EXAM: VS:  BP 132/70   Pulse 71   Ht 5\' 5"  (1.651 m)   Wt 201 lb 6.4 oz (91.4 kg)   SpO2 95%   BMI 33.51 kg/m  , BMI Body mass index is 33.51 kg/m. Last weight:  Wt Readings from Last 3 Encounters:  09/05/22 201 lb 6.4 oz (91.4 kg)  08/28/22 197 lb 3.2 oz (89.4 kg)  08/21/22 201 lb 12.8 oz (91.5 kg)     Physical Exam Vitals reviewed.  Constitutional:      Appearance: Normal appearance. He is normal weight.  HENT:     Head: Normocephalic.     Nose: Nose normal.     Mouth/Throat:     Mouth: Mucous membranes are moist.  Eyes:     Pupils: Pupils are equal, round, and reactive to light.  Cardiovascular:     Rate and Rhythm: Normal rate and regular rhythm.     Pulses: Normal pulses.     Heart sounds: Normal heart sounds.  Pulmonary:     Effort: Pulmonary effort is normal.  Abdominal:     General: Abdomen is flat. Bowel sounds are normal.  Musculoskeletal:        General: Normal range of motion.     Cervical back: Normal range of motion.  Skin:    General: Skin is warm.  Neurological:     General: No focal deficit present.     Mental Status: He is alert.  Psychiatric:        Mood and Affect: Mood normal.     EKG: none today  Recent Labs: 05/25/2022: Platelets 125 08/23/2022: ALT 7; BUN 45; Creatinine, Ser 3.36; Hemoglobin 11.5; Potassium 5.4; Sodium 145 08/28/2022: TSH 2.820    Lipid Panel    Component Value Date/Time   CHOL 91 (L) 08/23/2022 1056   CHOL 100 07/26/2012 0111   TRIG 136 08/23/2022 1056   TRIG 157 07/26/2012 0111   HDL 36 (L)  08/23/2022 1056   HDL 35 (L) 07/26/2012 0111   CHOLHDL 2.5 08/23/2022 1056   VLDL 31 07/26/2012 0111   LDLCALC 31 08/23/2022 1056   LDLCALC 34 07/26/2012 0111      Other studies Reviewed: stress test 08/2022   ASSESSMENT AND PLAN:    ICD-10-CM   1. Coronary artery disease involving native coronary artery of native heart without angina pectoris  I25.10     2. Essential hypertension  I10     3. HFrEF (heart failure with reduced ejection fraction) (HCC)  I50.20 torsemide (DEMADEX) 20 MG tablet    4. Mixed hyperlipidemia  E78.2        Problem List Items Addressed This Visit       Cardiovascular and Mediastinum   Essential hypertension    Controlled today. Continue same medications.       Relevant Medications   torsemide (DEMADEX) 20 MG tablet   Coronary artery disease - Primary    Stress test 08/2022 Ischemia in the RCA territory with borderline LVEF dysfunction, advise CCTA. Kidney function decreased, unable to do CCTA. Patient denies chest pain. Will treat medically.      Relevant Medications   torsemide (DEMADEX) 20 MG tablet   HFrEF (heart failure with reduced ejection fraction) (HCC)    Previous echo 2023 EF 48%, echo 07/2022 EF 38%. Patient complaining of worsening shortness of breath. Unable to change losartan to [facility] due to decreased kidney function.       Relevant Medications   torsemide (DEMADEX) 20 MG tablet     Other   Hyperlipidemia   Relevant Medications   torsemide (DEMADEX) 20 MG tablet     Disposition:   Return in about 4 weeks (around 10/03/2022).    Total time spent: 30 minutes  Signed,  Adrian Blackwater, MD  09/05/2022 12:28 PM    [facility]

## 2022-09-05 NOTE — Assessment & Plan Note (Signed)
Controlled today. Continue same medications. 

## 2022-09-05 NOTE — Assessment & Plan Note (Signed)
Previous echo 2023 EF 48%, echo 07/2022 EF 38%. Patient complaining of worsening shortness of breath. Unable to change losartan to [facility] due to decreased kidney function.

## 2022-09-06 ENCOUNTER — Emergency Department: Payer: Medicare HMO

## 2022-09-06 ENCOUNTER — Encounter: Payer: Self-pay | Admitting: Emergency Medicine

## 2022-09-06 ENCOUNTER — Emergency Department
Admission: EM | Admit: 2022-09-06 | Discharge: 2022-09-07 | Disposition: A | Discharge status: Home / Self Care | Payer: Medicare HMO | Attending: Emergency Medicine | Admitting: Emergency Medicine

## 2022-09-06 DIAGNOSIS — R42 Dizziness and giddiness: Secondary | ICD-10-CM | POA: Diagnosis not present

## 2022-09-06 DIAGNOSIS — I509 Heart failure, unspecified: Secondary | ICD-10-CM | POA: Diagnosis not present

## 2022-09-06 DIAGNOSIS — I2510 Atherosclerotic heart disease of native coronary artery without angina pectoris: Secondary | ICD-10-CM | POA: Diagnosis not present

## 2022-09-06 DIAGNOSIS — N184 Chronic kidney disease, stage 4 (severe): Secondary | ICD-10-CM | POA: Insufficient documentation

## 2022-09-06 DIAGNOSIS — R55 Syncope and collapse: Secondary | ICD-10-CM | POA: Diagnosis not present

## 2022-09-06 DIAGNOSIS — R0689 Other abnormalities of breathing: Secondary | ICD-10-CM | POA: Diagnosis not present

## 2022-09-06 DIAGNOSIS — J449 Chronic obstructive pulmonary disease, unspecified: Secondary | ICD-10-CM | POA: Insufficient documentation

## 2022-09-06 DIAGNOSIS — I959 Hypotension, unspecified: Secondary | ICD-10-CM | POA: Diagnosis not present

## 2022-09-06 LAB — CBC
HCT: 31.4 % — ABNORMAL LOW (ref 39.0–52.0)
Hemoglobin: 10.2 g/dL — ABNORMAL LOW (ref 13.0–17.0)
MCH: 32 pg (ref 26.0–34.0)
MCHC: 32.5 g/dL (ref 30.0–36.0)
MCV: 98.4 fL (ref 80.0–100.0)
Platelets: 218 10*3/uL (ref 150–400)
RBC: 3.19 MIL/uL — ABNORMAL LOW (ref 4.22–5.81)
RDW: 14 % (ref 11.5–15.5)
WBC: 8.3 10*3/uL (ref 4.0–10.5)
nRBC: 0 % (ref 0.0–0.2)

## 2022-09-06 LAB — BASIC METABOLIC PANEL
Anion gap: 10 (ref 5–15)
BUN: 76 mg/dL — ABNORMAL HIGH (ref 8–23)
CO2: 22 mmol/L (ref 22–32)
Calcium: 8.5 mg/dL — ABNORMAL LOW (ref 8.9–10.3)
Chloride: 107 mmol/L (ref 98–111)
Creatinine, Ser: 3.25 mg/dL — ABNORMAL HIGH (ref 0.61–1.24)
GFR, Estimated: 19 mL/min — ABNORMAL LOW (ref >60)
Glucose, Bld: 122 mg/dL — ABNORMAL HIGH (ref 70–99)
Potassium: 4.3 mmol/L (ref 3.5–5.1)
Sodium: 139 mmol/L (ref 135–145)

## 2022-09-06 LAB — TROPONIN I (HIGH SENSITIVITY): Troponin I (High Sensitivity): 11 ng/L (ref <18)

## 2022-09-06 MED ORDER — SODIUM CHLORIDE 0.9 % IV BOLUS
500.0000 mL | Freq: Once | INTRAVENOUS | Status: AC
  Administered 2022-09-06: 500 mL via INTRAVENOUS

## 2022-09-06 NOTE — ED Triage Notes (Signed)
Pt presents via [facility] from home following a syncopal episode. Per EMS, the patient had positive orthostatic VS and became dizzy with standing after dinner. No LOC, falls, nor did he hit his head. Pt received NS PTA. A&Ox4 at this time. Denies CP or SOB.

## 2022-09-06 NOTE — ED Notes (Signed)
Family to STAT desk upset that pt was sent to the lobby; assured them that pt has been triaged, protocols initiated and pt will be taken back to an exam room according to acuity ; family remains upset

## 2022-09-06 NOTE — ED Provider Notes (Signed)
[facility] Provider Note    Event Date/Time   First MD Initiated Contact with Patient 09/06/22 2305     (approximate)   History   Near Syncope   HPI Tavares Kiyabu is a 77 y.o. male whose medical history includes COPD on chronic supplementary oxygen, chronic kidney disease stage IV, bilateral lower extremity claudication, coronary artery disease, and CHF with slightly reduced ejection fraction.  He sees Dr. [name] with cardiology and has recently had stress test and echocardiograms.  He presents tonight after either near syncope or a full syncopal episode.  The patient stood up after dinner and became very dizzy.  He did not fall or hit his head but was very lightheaded with low blood pressure checked at home and by EMS.  EMS noted that his blood pressure was almost normal when they checked it until they checked orthostatics and then a drop down to the 60s systolic.  He currently feels better while lying in bed as long as he does not try to stand up and move around.  At no point did he have any chest pain.  He has no acute shortness of breath greater than his baseline and is currently on his 3 L of supplementary oxygen by nasal cannula.  He has not had any nausea or vomiting.  He has been eating and drinking okay and his wife and his daughter have been looking after him well.  They note that a couple of weeks ago his cardiologist switched him from furosemide to torsemide and he has been urinating quite a bit.  He is also still on multiple blood pressure medications.     Physical Exam   Triage Vital Signs: ED Triage Vitals  Enc Vitals Group     BP 09/06/22 2023 (!) 134/55     Pulse Rate 09/06/22 2023 (!) 114     Resp 09/06/22 2023 20     Temp 09/06/22 2023 97.6 F (36.4 C)     Temp Source 09/06/22 2023 Oral     SpO2 09/06/22 2023 98 %     Weight --      Height --      Head Circumference --      Peak Flow --      Pain Score 09/06/22 2024 0      Pain Loc --      Pain Edu? --      Excl. in GC? --     Most recent vital signs: Vitals:   09/07/22 0030 09/07/22 0145  BP: (!) 160/64   Pulse: 63 (!) 56  Resp: 11 20  Temp:    SpO2: 100% 100%    General: Awake, no distress.  Appears chronically ill but not in acute distress. CV:  Good peripheral perfusion.  Normal heart sounds, regular rate and rhythm Resp:  Normal effort. Speaking easily and comfortably, no accessory muscle usage nor intercostal retractions.  Lungs clear to auscultation bilaterally, currently utilizing supplementary oxygen. Abd:  No distention.  Other:  Awake, alert, oriented appropriately, no distress, making jokes with me and seems very comfortable.   ED Results / Procedures / Treatments   Labs (all labs ordered are listed, but only abnormal results are displayed) Labs Reviewed  BASIC METABOLIC PANEL - Abnormal; Notable for the following components:      Result Value   Glucose, Bld 122 (*)    BUN 76 (*)    Creatinine, Ser 3.25 (*)    Calcium 8.5 (*)  GFR, Estimated 19 (*)    All other components within normal limits  CBC - Abnormal; Notable for the following components:   RBC 3.19 (*)    Hemoglobin 10.2 (*)    HCT 31.4 (*)    All other components within normal limits  TROPONIN I (HIGH SENSITIVITY)  TROPONIN I (HIGH SENSITIVITY)     EKG  ED ECG REPORT I, Loleta Rose, the attending physician, personally viewed and interpreted this ECG.  Date: 09/06/2022 EKG Time: 20: 29 Rate: 59 Rhythm: Sinus rhythm, borderline bradycardia QRS Axis: normal Intervals: normal ST/T Wave abnormalities: normal Narrative Interpretation: no evidence of acute ischemia    RADIOLOGY I viewed and interpreted the patient's chest x-ray.  I see no evidence of pneumonia or pulmonary edema.  I also read the radiologist's report, which confirmed no acute findings.   PROCEDURES:  Critical Care performed: No  .1-3 Lead EKG Interpretation  Performed by: Loleta Rose, MD Authorized by: Loleta Rose, MD     Interpretation: normal     ECG rate:  60   ECG rate assessment: normal     Rhythm: sinus rhythm     Ectopy: none     Conduction: normal       IMPRESSION / MDM / ASSESSMENT AND PLAN / ED COURSE  I reviewed the triage vital signs and the nursing notes.                              Differential diagnosis includes, but is not limited to, volume depletion in the setting of more aggressive diuresis, ACS, less likely PE or acute infectious process.  Patient's presentation is most consistent with acute presentation with potential threat to life or bodily function.  Labs/studies ordered: High-sensitivity troponin x 2, basic metabolic panel, CBC, EKG, 1 view chest x-ray  Interventions/Medications given:  Medications  sodium chloride 0.9 % bolus 500 mL (0 mLs Intravenous Stopped 09/07/22 0033)    (Note:  hospital course my include additional interventions and/or labs/studies not listed above.)   I strongly believe that the patient's symptoms are the result of volume depletion in the setting of increased diuresis.  His creatinine has bumped just slightly to 3.25 but his baseline seems to be right around 3 and his GFR is essentially static at 19; his family told me that the last time they were told his GFR just recently it was 18.  His labs are otherwise reassuring and the second troponin is pending.  I reviewed his record including his echocardiogram from 2022 and his recent echocardiogram from 08/17/2022.  He has a slightly reduced ejection fraction but not substantially so.  Labs are otherwise reassuring and the patient is asymptomatic at rest.  The patient is on the cardiac monitor to evaluate for evidence of arrhythmia and/or significant heart rate changes.  The current plan is to check the results of the repeat troponin and provide a normal saline 500 mL IV bolus and recheck his blood pressure and orthostatics.  We can give additional boluses  until he is no longer symptomatic, but his family is understandably very concerned about giving him too much fluid at once and we will hold off on a full liter to avoid volume overload and possible development of pulmonary edema.  Clinical Course as of 09/07/22 0231  Thu Sep 07, 2022  0135 Repeat orthostatics show a drop in blood pressure when he goes from lying to sitting, but he remains  asymptomatic.  His blood pressure actually improved when he went from sitting to standing.  I considered providing more fluids, but he said he feels fine and wants to go home.  Overall his blood pressure is improved.  I compromised by recommending that he not take a dose of torsemide in the morning and contact Dr. [name] at the next available opportunity.  I recommended continuing with his other medications.  I gave my usual and customary return precautions and he and the family understand and agree with the plan. [CF]    Clinical Course User Index [CF] Loleta Rose, MD     FINAL CLINICAL IMPRESSION(S) / ED DIAGNOSES   Final diagnoses:  Syncope and collapse     Rx / DC Orders   ED Discharge Orders     None        Note:  This document was prepared using Dragon voice recognition software and may include unintentional dictation errors.   Loleta Rose, MD 09/07/22 [phone]

## 2022-09-07 ENCOUNTER — Ambulatory Visit (INDEPENDENT_AMBULATORY_CARE_PROVIDER_SITE_OTHER): Payer: Medicare HMO | Admitting: Cardiovascular Disease

## 2022-09-07 ENCOUNTER — Encounter: Payer: Self-pay | Admitting: Cardiovascular Disease

## 2022-09-07 VITALS — BP 120/56 | HR 70 | Ht 65.0 in | Wt 203.0 lb

## 2022-09-07 DIAGNOSIS — I5020 Unspecified systolic (congestive) heart failure: Secondary | ICD-10-CM | POA: Diagnosis not present

## 2022-09-07 LAB — TROPONIN I (HIGH SENSITIVITY): Troponin I (High Sensitivity): 10 ng/L (ref <18)

## 2022-09-07 MED ORDER — TORSEMIDE 10 MG PO TABS: 10.0000 mg | ORAL_TABLET | Freq: Every day | ORAL | 11 refills | Status: DC

## 2022-09-07 MED ORDER — LOSARTAN POTASSIUM 25 MG PO TABS: 25.0000 mg | ORAL_TABLET | Freq: Every day | ORAL | 11 refills | Status: DC

## 2022-09-07 NOTE — Assessment & Plan Note (Signed)
Patient has worsening renal failure with GFR went from 22-18 and creatinine over 3 developed hypotension with blood pressure systolic 60 last night and ended up in the emergency room.  Patient received IV fluid in the emergency room and had the torsemide 20 mg held yesterday along with today.  Blood pressure right now repeat 1 is 140/50.  Stress test showed ischemia in the RCA territory and it was recommended to do CCTA but the creatinine is 3.5.  Thus cardiac cath or CCTA cannot be done otherwise he will end up on dialysis.  Plan is to hold torsemide for another day or 2 and then started at 10 mg daily.  Also decrease losartan from 50 mg to 25 mg p.o. once a day.  Also patient will be seen in 1 week to further evaluate if any adjustment needs to be done.  Is going to be very difficult to treat this patient because has renal insufficiency along with ejection fraction dropping to 38% from normal in the past and labile blood pressure.

## 2022-09-07 NOTE — Progress Notes (Signed)
Cardiology Office Note   Date:  09/07/2022   ID:  Pride, Novo 1946/02/07, MRN [id number]  PCP:  Miki Kins, FNP  Cardiologist:  Adrian Blackwater, MD      History of Present Illness: [name] [name] is a 77 y.o. male who presents for  Chief Complaint  Patient presents with   Hospitalization Follow-up    Hospital follow up    Low BP after taking lasix. BPS was 60. He got fluid in ER and now off toresemide for 2 days.      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 post. helix. 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   CAD (coronary artery disease)    CAP (community acquired pneumonia) 07/26/2020   CKD (chronic kidney disease), stage IV (HCC)    Complete tear of right rotator cuff 11/15/2015   Complex tear of lateral meniscus of left knee as current injury 12/06/2020   Complex tear of medial meniscus of left knee as current injury 10/18/2020   COPD (chronic obstructive pulmonary disease) (HCC)    DM (diabetes mellitus), type 2 (HCC) 07/30/2020   DOE (dyspnea on exertion)    GERD (gastroesophageal reflux disease)    Heart murmur    HFrEF (heart failure with reduced ejection fraction) (HCC)    History of hiatal hernia    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  Performed by Dr. Zebedee Iba on 08/01/16:  29 mm commercially available Medtronic Core Valve transcatheter aortic valve procedure placed via transfemoral approach by Dr. Zebedee Iba and co-surgeon Dr. Winona Legato.   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 MOHS.   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/07/2021   Right zygoma, EDC 05/02/21   Supplemental oxygen dependent    3-4 L/Rudy   T2DM (type 2 diabetes mellitus) (HCC)      Past Surgical History:  Procedure Laterality Date   AORTIC VALVE REPLACEMENT N/A 08/01/2016   29 mm CoreValve Evolut; Location: [facility]; Surgeon: Clent Jacks, MD   CARDIAC CATHETERIZATION N/A 12/26/2012   2v CAD; retained pigtail in myocardium --> transferred to [facility]; Location: [facility]; Surgeon: Despina Hick, MD   COLONOSCOPY     KNEE ARTHROSCOPY WITH MEDIAL MENISECTOMY Left 12/02/2020   Procedure: KNEE ARTHROSCOPY WITH DEBRIDEMENT AND PARTIAL MEDIAL AND LATERAL MENISECTOMY;  Surgeon: Christena Flake, MD;  Location: [facility] ORS;  Service: Orthopedics;  Laterality: Left;   LITHOTRIPSY     PERCUTANEOUS REMOVAL INTRA-AORTIC BALLOON CATH N/A 12/26/2012   Procedure:  PERCUTANEOUS REMOVAL INTRA-AORTIC BALLOON CATH; Surgeon: Heloise Ochoa, MD; Location: [facility]; Service: Cardiothoracic   RIGHT HEART CATH Right 07/13/2016   Location: [facility]; Surgeon: Emilio Math, MD   TRANSESOPHAGEAL ECHOCARDIOGRAM N/A 12/26/2012   Procedure: TRANSESOPHAGEAL ECHOCARDIOGRAPHY; Surgeon: Heloise Ochoa, MD; Location: [facility]; Service: Cardiothoracic   UMBILICAL HERNIA REPAIR N/A 02/13/2014   Procedure: LAPAROSCOPIC UMBILICAL HERNIA REPAIR; Surgeon: Merlinda Frederick, MD; Location: [facility]; Service: General  Surgery     Current Outpatient Medications  Medication Sig Dispense Refill   acetaminophen (TYLENOL) 650 MG CR tablet Take 1,300 mg by mouth every 8 (eight) hours as needed for pain.     albuterol (VENTOLIN HFA) 108 (90 Base) MCG/ACT inhaler Inhale 2 puffs into the lungs every 6 (six) hours as needed for wheezing or shortness of breath.     Alcohol Swabs (DROPSAFE ALCOHOL PREP) 70 % PADS Apply 1 each topically as needed (With repatha and blood sugar checks.).     allopurinol (ZYLOPRIM) 100 MG tablet Take 50 mg by mouth daily.     Ascorbic Acid (VITAMIN C) 1000 MG tablet Take 1,000 mg by mouth at bedtime.     aspirin EC 81 MG EC tablet Take 1 tablet (81 mg total) by mouth daily.     busPIRone (BUSPAR) 5 MG tablet Take 5 mg by mouth 2 (two) times daily as needed (Anxiety).     calcitRIOL (ROCALTROL) 0.25 MCG capsule Take 0.25 mcg by mouth daily.     citalopram (CELEXA) 40 MG tablet TAKE 1 TABLET EVERY DAY 90 tablet 3   cyclobenzaprine (FLEXERIL) 5 MG tablet Take 5 mg by mouth at bedtime as needed for muscle spasms (or cramping in legs.  May half tablet if needed).     Evolocumab (REPATHA SURECLICK) 140 MG/ML SOAJ INJECT CONTENT OF 1 CARTRIDGE UNDER THE SKIN EVERY OTHER WEEK 6 mL 1   ezetimibe (ZETIA) 10 MG tablet TAKE 1 TABLET(10 MG) BY MOUTH AT BEDTIME 90 tablet 3   fexofenadine (ALLEGRA) 180 MG tablet Take 180 mg by mouth daily as needed for allergies or rhinitis.     fluticasone (FLONASE) 50 MCG/ACT nasal spray Place 2 sprays into both nostrils daily.  2   Fluticasone-Umeclidin-Vilant (TRELEGY ELLIPTA) 100-62.5-25 MCG/ACT AEPB Inhale 100 mcg into the lungs daily at 6 (six) AM.     gabapentin (NEURONTIN) 400 MG capsule TAKE 1 CAPSULE AT BEDTIME 90 capsule 3   guaiFENesin-codeine (GUAIFENESIN AC) 100-10 MG/5ML syrup Take 5 mLs by mouth 3 (three) times daily as needed for cough. 120 mL 0   ipratropium-albuterol (DUONEB) 0.5-2.5 (3) MG/3ML SOLN Take 3 mLs by nebulization every 4 (four) hours as  needed. 360 mL 3   losartan (COZAAR) 25 MG tablet Take 1 tablet (25 mg total) by mouth daily. 30 tablet 11   losartan (COZAAR) 50 MG tablet Take 1 tablet (50 mg total) by mouth daily. 30 tablet 2   metoprolol succinate (TOPROL-XL) 50 MG 24 hr tablet Take 50 mg by mouth every morning. Take with or immediately following a meal.     Multiple Vitamins-Minerals (PRESERVISION AREDS) CAPS Take by mouth.     OXYGEN Inhale 3 L into the lungs See admin instructions. Used as needed throughout the day and continuous at bedtime     pantoprazole (PROTONIX) 40 MG tablet Take 40 mg by mouth every morning.     Simethicone (GAS-X PO) Take 1 tablet by mouth 2 (two) times daily as needed (flatulence).  sodium chloride (OCEAN) 0.65 % SOLN nasal spray Place 1 spray into both nostrils as needed for congestion.  0   SUPER B COMPLEX/C PO Take 1 tablet by mouth daily.     tamsulosin (FLOMAX) 0.4 MG CAPS capsule TAKE 1 CAPSULE EVERY DAY 90 capsule 3   torsemide (DEMADEX) 10 MG tablet Take 1 tablet (10 mg total) by mouth daily. 30 tablet 11   torsemide (DEMADEX) 20 MG tablet Take 1 tablet (20 mg total) by mouth daily. (Patient not taking: Reported on 09/07/2022) 90 tablet 1   No current facility-administered medications for this visit.    Allergies:   Nsaids, Statins, and Nexletol [bempedoic acid]    Social History:   reports that he quit smoking about 14 years ago. His smoking use included cigarettes. He has a 108.00 pack-year smoking history. He quit smokeless tobacco use about 18 years ago.  His smokeless tobacco use included chew. He reports that he does not drink alcohol and does not use drugs.   Family History:  family history includes Arthritis in his son; Asthma in his daughter; COPD in his daughter; Depression in his daughter; Diabetes in his daughter and son; Hyperlipidemia in his daughter; Hypertension in his son; Obesity in his daughter; Varicose Veins in his daughter. He was adopted.    ROS:     Review  of Systems  Constitutional: Negative.   HENT: Negative.    Eyes: Negative.   Respiratory: Negative.    Gastrointestinal: Negative.   Genitourinary: Negative.   Musculoskeletal: Negative.   Skin: Negative.   Neurological: Negative.   Endo/Heme/Allergies: Negative.   Psychiatric/Behavioral: Negative.    All other systems reviewed and are negative.     All other systems are reviewed and negative.    PHYSICAL EXAM: VS:  BP (!) 120/56   Pulse 70   Ht 5\' 5"  (1.651 m)   Wt 203 lb (92.1 kg)   SpO2 92% Comment: 3L  BMI 33.78 kg/m  , BMI Body mass index is 33.78 kg/m. Last weight:  Wt Readings from Last 3 Encounters:  09/07/22 203 lb (92.1 kg)  09/05/22 201 lb 6.4 oz (91.4 kg)  08/28/22 197 lb 3.2 oz (89.4 kg)     Physical Exam Vitals reviewed.  Constitutional:      Appearance: Normal appearance. He is normal weight.  HENT:     Head: Normocephalic.     Nose: Nose normal.     Mouth/Throat:     Mouth: Mucous membranes are moist.  Eyes:     Pupils: Pupils are equal, round, and reactive to light.  Cardiovascular:     Rate and Rhythm: Normal rate and regular rhythm.     Pulses: Normal pulses.     Heart sounds: Normal heart sounds.  Pulmonary:     Effort: Pulmonary effort is normal.  Abdominal:     General: Abdomen is flat. Bowel sounds are normal.  Musculoskeletal:        General: Normal range of motion.     Cervical back: Normal range of motion.  Skin:    General: Skin is warm.  Neurological:     General: No focal deficit present.     Mental Status: He is alert.  Psychiatric:        Mood and Affect: Mood normal.       EKG:   Recent Labs: 08/23/2022: ALT 7 08/28/2022: TSH 2.820 09/06/2022: BUN 76; Creatinine, Ser 3.25; Hemoglobin 10.2; Platelets 218; Potassium 4.3; Sodium 139  Lipid Panel    Component Value Date/Time   CHOL 91 (L) 08/23/2022 1056   CHOL 100 07/26/2012 0111   TRIG 136 08/23/2022 1056   TRIG 157 07/26/2012 0111   HDL 36 (L) 08/23/2022  1056   HDL 35 (L) 07/26/2012 0111   CHOLHDL 2.5 08/23/2022 1056   VLDL 31 07/26/2012 0111   LDLCALC 31 08/23/2022 1056   LDLCALC 34 07/26/2012 0111      Other studies Reviewed: Additional studies/ records that were reviewed today include:  Review of the above records demonstrates:       No data to display            ASSESSMENT AND PLAN:    ICD-10-CM   1. HFrEF (heart failure with reduced ejection fraction) (HCC)  I50.20        Problem List Items Addressed This Visit       Cardiovascular and Mediastinum   HFrEF (heart failure with reduced ejection fraction) (HCC) - Primary    Patient has worsening renal failure with GFR went from 22-18 and creatinine over 3 developed hypotension with blood pressure systolic 60 last night and ended up in the emergency room.  Patient received IV fluid in the emergency room and had the torsemide 20 mg held yesterday along with today.  Blood pressure right now repeat 1 is 140/50.  Stress test showed ischemia in the RCA territory and it was recommended to do CCTA but the creatinine is 3.5.  Thus cardiac cath or CCTA cannot be done otherwise he will end up on dialysis.  Plan is to hold torsemide for another day or 2 and then started at 10 mg daily.  Also decrease losartan from 50 mg to 25 mg p.o. once a day.  Also patient will be seen in 1 week to further evaluate if any adjustment needs to be done.  Is going to be very difficult to treat this patient because has renal insufficiency along with ejection fraction dropping to 38% from normal in the past and labile blood pressure.      Relevant Medications   torsemide (DEMADEX) 10 MG tablet   losartan (COZAAR) 25 MG tablet       Disposition:   Return in about 1 week (around 09/14/2022).    Total time spent: 40 minutes  Signed,  Adrian Blackwater, MD  09/07/2022 2:34 PM    [facility]

## 2022-09-07 NOTE — Discharge Instructions (Addendum)
You have been seen today in the Emergency Department (ED)  for syncope (passing out).  Your workup including labs and EKG show reassuring results.  Your symptoms may be due to mild dehydration (volume depletion) in the setting of your recent change to torsemide, so it is important that you drink plenty of non-alcoholic fluids.  We recommend you skip 1 dose of torsemide in the morning and contact Dr. [name] for an appointment at the next available opportunity to discuss any other recommended changes in your medication regimen.  Please call your regular doctor as soon as possible to schedule the next available clinic appointment to follow up with him/her regarding your visit to the ED and your symptoms.  Return to the Emergency Department (ED)  if you have any further syncopal episodes (pass out again) or develop ANY chest pain, pressure, tightness, trouble breathing, sudden sweating, or other symptoms that concern you.

## 2022-09-08 ENCOUNTER — Other Ambulatory Visit: Payer: Self-pay

## 2022-09-08 MED ORDER — TORSEMIDE 10 MG PO TABS: 10.0000 mg | ORAL_TABLET | Freq: Every day | ORAL | 11 refills | Status: DC

## 2022-09-11 ENCOUNTER — Encounter: Payer: Self-pay | Admitting: Podiatry

## 2022-09-11 ENCOUNTER — Ambulatory Visit: Payer: Medicare HMO

## 2022-09-11 ENCOUNTER — Ambulatory Visit: Payer: Medicare HMO | Admitting: Dermatology

## 2022-09-13 ENCOUNTER — Ambulatory Visit
Admission: RE | Admit: 2022-09-13 | Discharge: 2022-09-13 | Disposition: A | Discharge status: Home / Self Care | Payer: Medicare HMO | Source: Ambulatory Visit | Attending: Podiatry | Admitting: Podiatry

## 2022-09-13 DIAGNOSIS — M19072 Primary osteoarthritis, left ankle and foot: Secondary | ICD-10-CM

## 2022-09-14 ENCOUNTER — Ambulatory Visit (INDEPENDENT_AMBULATORY_CARE_PROVIDER_SITE_OTHER): Payer: Medicare HMO | Admitting: Cardiovascular Disease

## 2022-09-14 ENCOUNTER — Encounter: Payer: Self-pay | Admitting: Cardiovascular Disease

## 2022-09-14 VITALS — BP 140/50 | HR 65 | Ht 65.0 in | Wt 202.0 lb

## 2022-09-14 DIAGNOSIS — I2510 Atherosclerotic heart disease of native coronary artery without angina pectoris: Secondary | ICD-10-CM

## 2022-09-14 DIAGNOSIS — I10 Essential (primary) hypertension: Secondary | ICD-10-CM

## 2022-09-14 DIAGNOSIS — E782 Mixed hyperlipidemia: Secondary | ICD-10-CM

## 2022-09-14 DIAGNOSIS — I5020 Unspecified systolic (congestive) heart failure: Secondary | ICD-10-CM | POA: Diagnosis not present

## 2022-09-14 NOTE — Progress Notes (Signed)
Cardiology Office Note   Date:  09/14/2022   ID:  [name], [name] 10/14/1945, MRN [id number]  PCP:  Miki Kins, FNP  Cardiologist:  Adrian Blackwater, MD      History of Present Illness: [name] [name] is a 77 y.o. male who presents for  Chief Complaint  Patient presents with   Follow-up    1 week follow up    Patient in office for one week follow up. Denies dizziness. Complains of feeling fatigued, leg weakness.    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 post. helix. 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   CAD (coronary artery disease)    CAP (community acquired pneumonia) 07/26/2020   CKD (chronic kidney disease), stage IV (HCC)    Complete tear of right rotator cuff 11/15/2015   Complex tear of lateral meniscus of left knee as current injury 12/06/2020   Complex tear of medial meniscus of left knee as current injury 10/18/2020   COPD (chronic obstructive pulmonary disease) (HCC)    DM (diabetes mellitus), type 2 (HCC) 07/30/2020   DOE (dyspnea on exertion)    GERD (gastroesophageal reflux disease)    Heart murmur    HFrEF (heart failure with reduced ejection fraction) (HCC)    History of hiatal hernia    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  Performed by Dr. Zebedee Iba on 08/01/16:  29 mm commercially available Medtronic Core Valve transcatheter aortic valve procedure placed via transfemoral approach by Dr. Zebedee Iba and co-surgeon Dr. Winona Legato.   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 MOHS.   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/07/2021   Right zygoma, EDC 05/02/21   Supplemental oxygen dependent    3-4 L/[place]   T2DM (type 2 diabetes mellitus) (HCC)      Past Surgical History:  Procedure Laterality Date   AORTIC VALVE REPLACEMENT N/A 08/01/2016   29 mm CoreValve Evolut; Location: [facility]; Surgeon: Clent Jacks, MD   CARDIAC CATHETERIZATION N/A 12/26/2012   2v CAD; retained pigtail in myocardium --> transferred to [facility]; Location: [facility]; Surgeon: Despina Hick, MD   COLONOSCOPY     KNEE ARTHROSCOPY WITH MEDIAL MENISECTOMY Left 12/02/2020   Procedure: KNEE ARTHROSCOPY WITH DEBRIDEMENT AND PARTIAL MEDIAL AND LATERAL MENISECTOMY;  Surgeon: Christena Flake, MD;  Location: [facility] ORS;  Service: Orthopedics;  Laterality: Left;   LITHOTRIPSY     PERCUTANEOUS REMOVAL INTRA-AORTIC BALLOON CATH N/A 12/26/2012   Procedure: PERCUTANEOUS REMOVAL INTRA-AORTIC BALLOON CATH; Surgeon:  Heloise Ochoa, MD; Location: [facility]; Service: Cardiothoracic   RIGHT HEART CATH Right 07/13/2016   Location: [facility]; Surgeon: Emilio Math, MD   TRANSESOPHAGEAL ECHOCARDIOGRAM N/A 12/26/2012   Procedure: TRANSESOPHAGEAL ECHOCARDIOGRAPHY; Surgeon: Heloise Ochoa, MD; Location: [facility]; Service: Cardiothoracic   UMBILICAL HERNIA REPAIR N/A 02/13/2014   Procedure: LAPAROSCOPIC UMBILICAL HERNIA REPAIR; Surgeon: Merlinda Frederick, MD; Location: [facility]; Service: General Surgery      Current Outpatient Medications  Medication Sig Dispense Refill   acetaminophen (TYLENOL) 650 MG CR tablet Take 1,300 mg by mouth every 8 (eight) hours as needed for pain.     albuterol (VENTOLIN HFA) 108 (90 Base) MCG/ACT inhaler Inhale 2 puffs into the lungs every 6 (six) hours as needed for wheezing or shortness of breath.     Alcohol Swabs (DROPSAFE ALCOHOL PREP) 70 % PADS Apply 1 each topically as needed (With repatha and blood sugar checks.).     allopurinol (ZYLOPRIM) 100 MG tablet Take 50 mg by mouth daily.     Ascorbic Acid (VITAMIN C) 1000 MG tablet Take 1,000 mg by mouth at bedtime.     aspirin EC 81 MG EC tablet Take 1 tablet (81 mg total) by mouth daily.     busPIRone (BUSPAR) 5 MG tablet Take 5 mg by mouth 2 (two) times daily as needed (Anxiety).     calcitRIOL (ROCALTROL) 0.25 MCG capsule Take 0.25 mcg by mouth daily.     citalopram (CELEXA) 40 MG tablet TAKE 1 TABLET EVERY DAY 90 tablet 3   cyclobenzaprine (FLEXERIL) 5 MG tablet Take 5 mg by mouth at bedtime as needed for muscle spasms (or cramping in legs.  May half tablet if needed).     Evolocumab (REPATHA SURECLICK) 140 MG/ML SOAJ INJECT CONTENT OF 1 CARTRIDGE UNDER THE SKIN EVERY OTHER WEEK 6 mL 1   ezetimibe (ZETIA) 10 MG tablet TAKE 1 TABLET(10 MG) BY MOUTH AT BEDTIME 90 tablet 3   fexofenadine (ALLEGRA) 180 MG tablet Take 180 mg by mouth daily as needed for allergies or rhinitis.     fluticasone (FLONASE) 50 MCG/ACT nasal spray Place 2 sprays into both nostrils daily.  2   Fluticasone-Umeclidin-Vilant (TRELEGY ELLIPTA) 100-62.5-25 MCG/ACT AEPB Inhale 100 mcg into the lungs daily at 6 (six) AM.     gabapentin (NEURONTIN) 400 MG capsule TAKE 1 CAPSULE AT BEDTIME 90 capsule 3   guaiFENesin-codeine (GUAIFENESIN AC) 100-10 MG/5ML syrup Take 5 mLs by mouth 3 (three) times daily as needed for cough. 120 mL 0   ipratropium-albuterol (DUONEB) 0.5-2.5 (3) MG/3ML SOLN Take 3 mLs by nebulization every 4 (four) hours as needed.  360 mL 3   losartan (COZAAR) 25 MG tablet Take 1 tablet (25 mg total) by mouth daily. 30 tablet 11   metoprolol succinate (TOPROL-XL) 50 MG 24 hr tablet Take 50 mg by mouth every morning. Take with or immediately following a meal.     Multiple Vitamins-Minerals (PRESERVISION AREDS) CAPS Take by mouth.     OXYGEN Inhale 3 L into the lungs See admin instructions. Used as needed throughout the day and continuous at bedtime     pantoprazole (PROTONIX) 40 MG tablet Take 40 mg by mouth every morning.     Simethicone (GAS-X PO) Take 1 tablet by mouth 2 (two) times daily as needed (flatulence).     sodium chloride (OCEAN) 0.65 % SOLN nasal spray Place 1 spray into both nostrils as needed for congestion.  0   SUPER B  COMPLEX/C PO Take 1 tablet by mouth daily.     tamsulosin (FLOMAX) 0.4 MG CAPS capsule TAKE 1 CAPSULE EVERY DAY 90 capsule 3   tiotropium (SPIRIVA) 18 MCG inhalation capsule Place 18 mcg into inhaler and inhale daily.     torsemide (DEMADEX) 10 MG tablet Take 1 tablet (10 mg total) by mouth daily. 30 tablet 11   No current facility-administered medications for this visit.    Allergies:   Nsaids, Statins, and Nexletol [bempedoic acid]    Social History:   reports that he quit smoking about 14 years ago. His smoking use included cigarettes. He has a 108.00 pack-year smoking history. He quit smokeless tobacco use about 18 years ago.  His smokeless tobacco use included chew. He reports that he does not drink alcohol and does not use drugs.   Family History:  family history includes Arthritis in his son; Asthma in his daughter; COPD in his daughter; Depression in his daughter; Diabetes in his daughter and son; Hyperlipidemia in his daughter; Hypertension in his son; Obesity in his daughter; Varicose Veins in his daughter. He was adopted.    ROS:     Review of Systems  Constitutional: Negative.   HENT: Negative.    Eyes: Negative.   Respiratory: Negative.    Cardiovascular: Negative.    Gastrointestinal: Negative.   Genitourinary: Negative.   Musculoskeletal: Negative.   Skin: Negative.   Neurological: Negative.   Endo/Heme/Allergies: Negative.   Psychiatric/Behavioral: Negative.    All other systems reviewed and are negative.    All other systems are reviewed and negative.    PHYSICAL EXAM: VS:  BP (!) 140/50 (BP Location: Right Arm, Patient Position: Sitting, Cuff Size: Small)   Pulse 65   Ht 5\' 5"  (1.651 m)   Wt 202 lb (91.6 kg)   SpO2 95%   BMI 33.61 kg/m  , BMI Body mass index is 33.61 kg/m. Last weight:  Wt Readings from Last 3 Encounters:  09/14/22 202 lb (91.6 kg)  09/07/22 203 lb (92.1 kg)  09/05/22 201 lb 6.4 oz (91.4 kg)    Physical Exam Vitals reviewed.  Constitutional:      Appearance: Normal appearance. He is normal weight.  HENT:     Head: Normocephalic.     Nose: Nose normal.     Mouth/Throat:     Mouth: Mucous membranes are moist.  Eyes:     Pupils: Pupils are equal, round, and reactive to light.  Cardiovascular:     Rate and Rhythm: Normal rate and regular rhythm.     Pulses: Normal pulses.     Heart sounds: Normal heart sounds.  Pulmonary:     Effort: Pulmonary effort is normal.  Abdominal:     General: Abdomen is flat. Bowel sounds are normal.  Musculoskeletal:        General: Normal range of motion.     Cervical back: Normal range of motion.  Skin:    General: Skin is warm.  Neurological:     General: No focal deficit present.     Mental Status: He is alert.  Psychiatric:        Mood and Affect: Mood normal.     EKG: none today  Recent Labs: 08/23/2022: ALT 7 08/28/2022: TSH 2.820 09/06/2022: BUN 76; Creatinine, Ser 3.25; Hemoglobin 10.2; Platelets 218; Potassium 4.3; Sodium 139    Lipid Panel    Component Value Date/Time   CHOL 91 (L) 08/23/2022 1056   CHOL 100 07/26/2012 0111  TRIG 136 08/23/2022 1056   TRIG 157 07/26/2012 0111   HDL 36 (L) 08/23/2022 1056   HDL 35 (L) 07/26/2012 0111   CHOLHDL 2.5  08/23/2022 1056   VLDL 31 07/26/2012 0111   LDLCALC 31 08/23/2022 1056   LDLCALC 34 07/26/2012 0111     ASSESSMENT AND PLAN:    ICD-10-CM   1. Coronary artery disease involving native coronary artery of native heart without angina pectoris  I25.10     2. Essential hypertension  I10     3. HFrEF (heart failure with reduced ejection fraction) (HCC)  I50.20     4. Mixed hyperlipidemia  E78.2        Problem List Items Addressed This Visit       Cardiovascular and Mediastinum   Essential hypertension    Better controlled. No longer dizziness.      Coronary artery disease - Primary   HFrEF (heart failure with reduced ejection fraction) (HCC)    Feeling well. Continue Torsemide 10 mg.        Other   Hyperlipidemia    08/23/22 LDL 31, continue Repatha, Zetia        Disposition:   Return in about 4 weeks (around 10/12/2022).    Total time spent: 30 minutes  Signed,  Adrian Blackwater, MD  09/14/2022 2:43 PM    [facility]

## 2022-09-14 NOTE — Assessment & Plan Note (Signed)
08/23/22 LDL 31, continue Repatha, Zetia

## 2022-09-14 NOTE — Assessment & Plan Note (Signed)
Better controlled. No longer dizziness.

## 2022-09-14 NOTE — Assessment & Plan Note (Signed)
Feeling well. Continue Torsemide 10 mg.

## 2022-09-17 ENCOUNTER — Other Ambulatory Visit: Payer: Self-pay | Admitting: Cardiovascular Disease

## 2022-09-17 DIAGNOSIS — I5020 Unspecified systolic (congestive) heart failure: Secondary | ICD-10-CM

## 2022-09-19 DIAGNOSIS — H2512 Age-related nuclear cataract, left eye: Secondary | ICD-10-CM | POA: Diagnosis not present

## 2022-09-20 ENCOUNTER — Ambulatory Visit: Payer: Medicare HMO

## 2022-09-20 ENCOUNTER — Other Ambulatory Visit: Payer: Self-pay | Admitting: Family

## 2022-09-21 NOTE — Chronic Care Management (AMB) (Signed)
Follow Up Pharmacist Visit (CCM)  TAAVI, NIEMELA 76 years, Male  DOB: [birth year]  M: [phone]  __________________________________________________ Clinical Summary Next Pharmacist Follow Up: July 2024 Next AWV: June 2024 Summary for PCP:  - Patient is dealing with Dx of Heart failure. He complained of feeling tired. He has follow up with PCP and Cardiology in June  - FPO in July . Patient's Chronic Conditions: Hypertension (HTN), Cardiovascular Disease (CVD), Heart Failure, Chronic Obstructive Pulmonary Disease (COPD), Gastroesophageal Reflux Disease (GERD), Chronic Kidney Disease (CKD), Benign Prostatic Hyperplasia (BPH), Hyperlipidemia/Dyslipidemia (HLD), Diabetes (DM) . Engagement Notes Laury Axon on 08/31/2022 12:05 PM Chart Prep: 20 mins-billable Extra chart prep/care plan update: 27 mins (nonbillable) . Pre-Call Questions Date:: 08/31/2022 Time:: PM Outcome:: Successful Confirmed appointment date/time with patient/caregiver?: Yes Date/time of the appointment: Patient changed to 09/11/22 @4pm  Visit type: Phone Patient/Caregiver instructed to bring medications to appointment: Yes What, if any, problems do you have getting your medications from the pharmacy?: None What is your top health concern to discuss at your upcoming visit?: n/a Have you seen any other providers since your last visit?: No . Disease Assessments Visit Date Visit Completed on: 09/20/2022 . Subjective Information Subjective: Moves slowly, neighbor helps the couple a lot - with yard. Cataract surgery on 6/11 - Pre op done yesterday. Diet is watched on and off. Dx of Gout - and was put on diet restrictions. Bruise around his toe - go back to Dr.Baker next week. Lifestyle habits: Diet: Appetite is "too good" sometimes. Exercise: if he walks too far, lets give out. Left knee is bad. Too far is a block. He takes super short strolls, cannot walk across the street. Smoking: quit in  2010 Alcohol: occasional Beer What is the patient's sleep pattern?: Trouble staying asleep, Other (with free form text), Trouble going back to sleep after waking up Details: bathroom breaks 3-4times, Day time naps SDOH: Accountable Health Communities Health-Related Social Needs Screening Tool ([employer]) SDOH questions were documented in [facility] within the past 12 months or since hospitalization?: Yes . Medication Adherence Does the [facility] have access to medication refill history?: No Is Patient using [facility] pharmacy?: No Name and location of Current pharmacy: [facility] Current Rx insurance plan: [employer] Are meds synced by current pharmacy?: Yes Are meds delivered by current pharmacy?: Yes - by mail order pharmacy . Hypertension (HTN) Most Recent BP: 130/62 Most Recent HR: 79 taken on: 08/28/2022 Care Gap: Need BP documented or last BP 140/90 or higher: Addressed Assessed today?: Yes BP today is: unable to obtain at visit Goal: <130/80 mmHG Is Patient checking BP at home?: Yes Has patient experienced hypotension, dizziness, falls or bradycardia?: Yes Details: sometimes - maybe dehydration We discussed: Proper Home BP Measurement, Contacting PCP office for signs and symptoms of high or low blood pressure (hypotension, dizziness, falls, headaches, edema) Assessment:: Controlled Drug: Losartan 50mg -Once daily Pharmacist Assessment: Appropriate, Effective, Safe, Accessible Drug: Metoprolol Succ 50mg -Once daily Pharmacist Assessment: Appropriate, Effective, Safe, Accessible Pharmacist Assessment: Appropriate, Effective, Safe, Accessible Plan/Follow up: Order: BP logs at least once a week . Hyperlipidemia/Dyslipidemia (HLD) Last Lipid panel on: 08/23/2022 TC (Goal<200): 91 LDL: 31 HDL (Goal>40): 36 TG (Goal<150): 136 ASCVD 10-year risk?is:: N/A due to lab values outside the range Assessed today?: No Drug:  Repatha 140mg -Every other week Drug: Ezetimibe 10mg -At bedtime . Diabetes (DM) Most recent A1C: 5.5 taken on: 08/23/2022 Most Recent GFR: 18 taken on: 08/23/2022 Type: 2 Most recent microalbumin ratio: >300 tested on: 05/29/2022 Care Gap: Statin therapy needed:  Patient excluded from population (Age > 65, exclusion code present) Care Gap: Need A1c documented or last A1c > 9 %: Addressed Care Gap: Need eye exam documented in EMR or by claim: Needs to be addressed Care Gap: Need eGFR and uACR for kidney health evaluation: Addressed Assessed today?: No . Chronic Obstructive Pulmonary Disease (COPD) Most recent Eosinophils: 0 taken on: 08/23/2022 Assessed today?: Yes Exacerbations since last visit with pharmacist?: No Completing CAT Assessment today?: No Additional Info: Patient has been using Advair discus as Trelegy is too expensive while on the [place] hole. Assessment:: Uncontrolled Drug: Duoneb-Q4H PRN Pharmacist Assessment: Appropriate, Query Effectiveness Drug: Ventolin HFA 149mcg-2 puffs Q6H PRN Pharmacist Assessment: Appropriate, Query Effectiveness Drug: Trelegy 100-62.5-25MCG-1 puff once daily at 6am Pharmacist Assessment: Appropriate, Query Effectiveness . Heart Failure Most recent Echo with EF: 38% taken on: 08/17/2022 Assessed today?: No Drug: Torsemide 20mg -Once daily Plan/Follow up: Plan: Patient is being followed by Cardiologist and PCP in the next 1 month. GERD Assessment Assessed today?: No Drug: Pantoprazole DR 40mg -Once daily . BPH Assessment Most Recent PSA: 1.5 taken on: 05/25/2022 Assessed today?: No Drug: Tamsulosin 0.4mg -Once daily . Chronic kidney disease (CKD) Most Recent GFR: 18 taken on: 08/23/2022 Previous GFR: 22 taken on: 05/25/2022 Most recent microalbumin ratio: >300 tested on: 05/29/2022 Assessed today?: No Cardiovascular Disease (CVD) Care Gap: Moderate / high intensity statin therapy needed: Patient excluded from population (Age  > 75, exclusion code present) Assessed today?: No Preventative Health Care Gap: Colorectal cancer screening: Patient excluded from population (Age > 69, hx of colorectal cancer or colectomy, hospice services) Care Gap: Annual Wellness Visit (AWV): Addressed . Laury Axon on 08/31/2022 11:47 AM Pt has appt for eye exam on 12/29/2022 & awv on 02/25/2023 . Lynann Bologna, PharmD Televisit  Documentation 

## 2022-09-21 NOTE — Chronic Care Management (AMB) (Signed)
CP Care Plan  [name] [name], BALFANZ 76 years, Male  DOB: 11-11-1945  M: [phone]  __________________________________________________ General Information Details Chronic Conditions: Hypertension (HTN), Cardiovascular Disease (CVD), Heart Failure (HF), Chronic Obstructive Pulmonary Disease (COPD), Gastroesophageal Reflux Disease (GERD), Chronic Kidney Disease (CKD), Benign Prostatic Hyperplasia (BPH), Hyperlipidemia/Dyslipidemia (HLD), Diabetes (DM) Contact your clinical pharmacist and care team with any questions or concerns. Team Phone #:: [phone] Clinical Pharmacist:: Lynann Bologna Health Concierge:: [facility] High Blood Pressure GOAL: Maintain Blood Pressure less than: 130/80 Your most recent BP is: 130/62 taken on: 08/28/2022 Patient education:: Educated patient to call PCP office if you develop episodes of low blood pressure, dizziness, falls or increased headaches, and/or swelling of legs and feet. Discussed checking and recording home blood pressure readings: Weekly Your current blood pressure medications are:: Losartan 50mg -Once daily Metoprolol Succ 50mg -Once daily High Cholesterol GOAL:: Maintain LDL (bad) cholesterol less than 55 Your most recent LDL level is: 31 Your most recent triglycerides level is: 136 taken on: 08/23/2022 Patient education:: Discussed how a diet high in fruits/vegetables/nuts/whole grains/beans may help to reduce your cholesterol. Increasing soluble fiber intake (whole grains, fruits, vegetables, beans, nuts and seeds). Recommended avoiding sugary foods and trans fat, limiting carbohydrates, and reducing portion sizes. Recommended Patient to incorporate more healthy fats (salmon, cold-water fish, almonds, walnuts) into their diet. Your current cholesterol medications are:: Repatha 140mg -Every other week Ezetimibe 10mg -At bedtime Diabetes (DM) GOAL: Maintain an A1c (long-term blood sugar control) less than: 7% Your most  recent A1C is: 5.5 taken on: 08/23/2022 Patient education:: Discussed with patient to watch for signs of low blood sugar (shaking, sweating, fast heart rate, hunger, headache, confusion, drowsiness, or irritability). A blood sugar less than 70 is considered dangerously low. Eat or drink something with high sugar (4 oz regular soda, 1 tablespoon honey or sugar, hard candy not sugar free, glucose tablets or gel. Avoid high fat foods when blood sugar is low (peanut butter is not recommended). Wait 15 minutes and check blood sugar. If still below 70, repeat. If above 70, eat a balanced snack or meal. Notify your care team if you are having more than one low blood sugar episode per week. Chronic Obstructive Pulmonary Disease (COPD) GOAL: Prevent: Worsening of shortness of breath Patient education:: Educated Patient on use of maintenance inhaler as directed, Recommend wearing oxygen as prescribed Your current COPD medications are:: Duoneb-1 vial every 4 hours as needed Ventolin HFA 165mcg-2 puffs every 6 hours as needed Trellegy 100-62.5-25MCG-1 puff once daily at 6am Heart Failure (HF) GOAL: Prevent: Worsening symptoms of fluid overload Patient education:: Recommend weighing yourself daily, using a scale on a hard flat surface, before breakfast after using the restroom and report any signs of fluid overload to your pharmacist, nurse, or primary care provider; Call if you have a weight gain of more than three pounds in a day or five pounds in a week, increased cough, swelling (feet, ankles, legs, stomach), shortness of breath, needing more pillows to sleep or needing to sit in a chair to sleep Your current heart failure medications are:: Torsemide 20mg -Once daily Gastroesophageal Reflux Disease (GERD) GOAL:: Reduce and relieve severity and frequency of Gastroesophageal Reflux Disease (GERD) symptoms and prevent long-term complications Patient education:: Recommend eating small frequent meals and identifying  and avoiding trigger foods (alcohol, acidic foods, caffeine, fatty foods, garlic, onion, peppermint, spicy foods) Your current GERD medications are:: Pantoprazole DR 40mg -Once daily Enlarged Prostate GOAL:: Reduce urinary symptoms Patient education:: Educated patient to monitor for increased  dizziness and lowered blood pressure, especially when changing positions, when taking treatments for enlarged prostate. Your current prostate medications are:: Tamsulosin 0.4mg -Once daily Chronic kidney disease (CKD) GOAL:: Slow progression of kidney disease and maintain blood pressure less than 130/80 Patient education:: Educated Patient on importance of blood pressure and blood sugar control to help stop kidney disease from worsening., Recommend avoiding medications that can harm the kidneys, such as a group of medications called NSAIDs, which include ibuprofen (Advil/Motrin) and naproxen (Aleve). Cardiovascular Disease (CVD) GOAL:: Slow progression of atherosclerosis (plaques / blockages) throughout your body to reduce risk of heart attack and strokes Patient education:: Discussed importance of maintaining good cholesterol levels, targeting a bad cholesterol level, or LDL, of less than 55, Discussed  the importance of their prescribed statin therapy. Though you cannot feel the effects of the statin, they work to reduce cholesterol and reduce the risk of future heart attack and stroke. Other Needs Additional Information:: Diabetic Eye Exam: 12/29/2022 Annual Wellness Visit: 02/25/2023  Careplan Update 

## 2022-09-22 DIAGNOSIS — N184 Chronic kidney disease, stage 4 (severe): Secondary | ICD-10-CM | POA: Diagnosis not present

## 2022-09-22 DIAGNOSIS — I129 Hypertensive chronic kidney disease with stage 1 through stage 4 chronic kidney disease, or unspecified chronic kidney disease: Secondary | ICD-10-CM | POA: Diagnosis not present

## 2022-09-22 DIAGNOSIS — I2510 Atherosclerotic heart disease of native coronary artery without angina pectoris: Secondary | ICD-10-CM | POA: Diagnosis not present

## 2022-09-22 DIAGNOSIS — E1122 Type 2 diabetes mellitus with diabetic chronic kidney disease: Secondary | ICD-10-CM | POA: Diagnosis not present

## 2022-09-27 ENCOUNTER — Encounter: Payer: Self-pay | Admitting: Ophthalmology

## 2022-09-27 NOTE — Anesthesia Preprocedure Evaluation (Addendum)
Anesthesia Evaluation  Patient identified by MRN, date of birth, ID band Patient awake    Reviewed: Allergy & Precautions, H&P , NPO status , Patient's Chart, lab work & pertinent test results  Airway Mallampati: IV  TM Distance: <3 FB Neck ROM: Full  Mouth opening: Limited Mouth Opening  Dental  (+) Upper Dentures   Pulmonary sleep apnea , pneumonia, COPD,  oxygen dependent, former smoker  reports that he quit smoking about 14 years ago. His smoking use included cigarettes. He has a 108.00 pack-year smoking history. He quit smokeless tobacco use about 18 years ago.  His smokeless tobacco use included chew.  09-06-22 currently on his 3 L of supplementary oxygen by nasal cannula 24/7   Pulmonary exam normal breath sounds clear to auscultation       Cardiovascular hypertension, + CAD and + DOE  Normal cardiovascular exam+ Valvular Problems/Murmurs  Rhythm:Regular Rate:Normal  07-28-20  (but now EF is 38% in 2024)  1. Left ventricular ejection fraction, by estimation, is 50 to 55%. (Has dropped to 38% in 2024)   The left ventricle has low normal function. The left ventricle demonstrates  global hypokinesis. There is mild left ventricular hypertrophy. Left  ventricular diastolic parameters are  consistent with Grade I diastolic dysfunction (impaired relaxation).   2. Right ventricular systolic function is normal. The right ventricular  size is normal.   3. Left atrial size was mildly dilated.   4. Right atrial size was mildly dilated.   5. The mitral valve was not well visualized. Trivial mitral valve  regurgitation. Moderate mitral annular calcification.   6. The aortic valve was not well visualized. Aortic valve regurgitation  is trivial.   09-05-22   Coronary artery disease - Primary      Stress test 08/2022 Ischemia in the RCA territory with borderline LVEF dysfunction, advise CCTA. Kidney function decreased, unable to do CCTA.  Patient denies chest pain. Will treat medically.        Previous echo 2023 EF 48%,      echo 07/2022 EF 38%. Patient complaining of worsening shortness of breath. Unable to change losartan to [facility] due to decreased kidney function.   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," did not see that in epic     Neuro/Psych  Headaches negative neurological ROS  negative psych ROS   GI/Hepatic negative GI ROS, Neg liver ROS, hiatal hernia,GERD  ,,  Endo/Other  diabetesHypothyroidism    Renal/GU Renal diseasenegative Renal ROS  negative genitourinary   Musculoskeletal negative musculoskeletal ROS (+) Arthritis ,    Abdominal   Peds negative pediatric ROS (+)  Hematology negative hematology ROS (+)   Anesthesia Other Findings T2DM (type 2 diabetes mellitus) Hypertension Kidney stones  COPD  Basal cell carcinoma  Squamous cell carcinoma of skin Pneumonia  Heart murmur OSA on CPAP  3 L/m/n/c oxygen DOE (dyspnea on exertion) History of hiatal hernia  Migraines Arthritis  Hyperkalemia CAD (coronary artery disease) Aortic stenosis, 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)  CKD (chronic kidney disease), stage IV HLD (hyperlipidemia)  HFrEF (heart failure with reduced ejection fraction) (HCC) Supplemental oxygen dependent GERD (gastroesophageal reflux disease)  Hiatal hernia BCC (basal cell carcinoma of skin) Squamous cell carcinoma of skin Respiratory failure (HCC)  Acute renal failure (ARF) (HCC) CAP (community acquired pneumonia) AKI (acute kidney injury) (HCC) DM (diabetes mellitus), type 2 (HCC) Accidental cut, puncture, perforation, or hemorrhage during heart catheterization Acute postoperative pain  ANA positive Benign hypertensive kidney disease with chronic kidney disease Complete tear of right rotator cuff Complex tear of medial meniscus of left knee as current injury Personal history of other malignant neoplasm of  skin Recurrent nephrolithiasis Rotator cuff tendinitis, right S/P TAVR (transcatheter aortic valve replacement)  Complex tear of lateral meniscus of left knee as current injury Grade I diastolic dysfunction Left atrial dilation Right atrial dilation     Reproductive/Obstetrics negative OB ROS                             Anesthesia Physical Anesthesia Plan  ASA: 4  Anesthesia Plan: MAC   Post-op Pain Management:    Induction: Intravenous  PONV Risk Score and Plan:   Airway Management Planned: Natural Airway and Nasal Cannula  Additional Equipment:   Intra-op Plan:   Post-operative Plan:   Informed Consent: I have reviewed the patients History and Physical, chart, labs and discussed the procedure including the risks, benefits and alternatives for the proposed anesthesia with the patient or authorized representative who has indicated his/her understanding and acceptance.     Dental Advisory Given  Plan Discussed with: Anesthesiologist, CRNA and Surgeon  Anesthesia Plan Comments: (Patient consented for risks of anesthesia including but not limited to:  - adverse reactions to medications - damage to eyes, teeth, lips or other oral mucosa - nerve damage due to positioning  - sore throat or hoarseness - Damage to heart, brain, nerves, lungs, other parts of body or loss of life  Patient voiced understanding.)        Anesthesia Quick Evaluation

## 2022-09-28 NOTE — Discharge Instructions (Signed)

## 2022-10-03 ENCOUNTER — Ambulatory Visit: Payer: Medicare HMO | Admitting: Cardiovascular Disease

## 2022-10-03 ENCOUNTER — Ambulatory Visit: Payer: Medicare HMO | Admitting: Anesthesiology

## 2022-10-03 ENCOUNTER — Encounter: Payer: Self-pay | Admitting: Ophthalmology

## 2022-10-03 ENCOUNTER — Encounter
Admission: RE | Disposition: A | Discharge status: Home / Self Care | Payer: Self-pay | Source: Home / Self Care | Attending: Ophthalmology

## 2022-10-03 ENCOUNTER — Other Ambulatory Visit: Payer: Self-pay

## 2022-10-03 ENCOUNTER — Ambulatory Visit
Admission: RE | Admit: 2022-10-03 | Discharge: 2022-10-03 | Disposition: A | Discharge status: Home / Self Care | Payer: Medicare HMO | Attending: Ophthalmology | Admitting: Ophthalmology

## 2022-10-03 DIAGNOSIS — I130 Hypertensive heart and chronic kidney disease with heart failure and stage 1 through stage 4 chronic kidney disease, or unspecified chronic kidney disease: Secondary | ICD-10-CM | POA: Diagnosis not present

## 2022-10-03 DIAGNOSIS — Z7984 Long term (current) use of oral hypoglycemic drugs: Secondary | ICD-10-CM | POA: Diagnosis not present

## 2022-10-03 DIAGNOSIS — H2512 Age-related nuclear cataract, left eye: Secondary | ICD-10-CM | POA: Diagnosis not present

## 2022-10-03 DIAGNOSIS — J449 Chronic obstructive pulmonary disease, unspecified: Secondary | ICD-10-CM | POA: Diagnosis not present

## 2022-10-03 DIAGNOSIS — Z952 Presence of prosthetic heart valve: Secondary | ICD-10-CM | POA: Diagnosis not present

## 2022-10-03 DIAGNOSIS — Z87891 Personal history of nicotine dependence: Secondary | ICD-10-CM | POA: Diagnosis not present

## 2022-10-03 DIAGNOSIS — H2511 Age-related nuclear cataract, right eye: Secondary | ICD-10-CM | POA: Diagnosis not present

## 2022-10-03 DIAGNOSIS — Z9981 Dependence on supplemental oxygen: Secondary | ICD-10-CM | POA: Insufficient documentation

## 2022-10-03 DIAGNOSIS — I5020 Unspecified systolic (congestive) heart failure: Secondary | ICD-10-CM | POA: Diagnosis not present

## 2022-10-03 DIAGNOSIS — E1122 Type 2 diabetes mellitus with diabetic chronic kidney disease: Secondary | ICD-10-CM | POA: Insufficient documentation

## 2022-10-03 DIAGNOSIS — E1136 Type 2 diabetes mellitus with diabetic cataract: Secondary | ICD-10-CM | POA: Diagnosis not present

## 2022-10-03 DIAGNOSIS — N184 Chronic kidney disease, stage 4 (severe): Secondary | ICD-10-CM | POA: Insufficient documentation

## 2022-10-03 DIAGNOSIS — I2510 Atherosclerotic heart disease of native coronary artery without angina pectoris: Secondary | ICD-10-CM | POA: Insufficient documentation

## 2022-10-03 DIAGNOSIS — I5022 Chronic systolic (congestive) heart failure: Secondary | ICD-10-CM | POA: Insufficient documentation

## 2022-10-03 DIAGNOSIS — K219 Gastro-esophageal reflux disease without esophagitis: Secondary | ICD-10-CM | POA: Insufficient documentation

## 2022-10-03 DIAGNOSIS — G4733 Obstructive sleep apnea (adult) (pediatric): Secondary | ICD-10-CM | POA: Diagnosis not present

## 2022-10-03 DIAGNOSIS — Z09 Encounter for follow-up examination after completed treatment for conditions other than malignant neoplasm: Secondary | ICD-10-CM | POA: Diagnosis not present

## 2022-10-03 HISTORY — DX: Hypothyroidism, unspecified: E03.9

## 2022-10-03 HISTORY — PX: CATARACT EXTRACTION W/PHACO: SHX586

## 2022-10-03 HISTORY — DX: Other ill-defined heart diseases: I51.89

## 2022-10-03 HISTORY — DX: Cardiomegaly: I51.7

## 2022-10-03 HISTORY — DX: Gout, unspecified: M10.9

## 2022-10-03 LAB — GLUCOSE, CAPILLARY: Glucose-Capillary: 99 mg/dL (ref 70–99)

## 2022-10-03 SURGERY — PHACOEMULSIFICATION, CATARACT, WITH IOL INSERTION
Anesthesia: Monitor Anesthesia Care | Laterality: Left

## 2022-10-03 MED ORDER — SIGHTPATH DOSE#1 BSS IO SOLN
INTRAOCULAR | Status: DC | PRN
  Administered 2022-10-03: 15 mL via INTRAOCULAR

## 2022-10-03 MED ORDER — SIGHTPATH DOSE#1 BSS IO SOLN
INTRAOCULAR | Status: DC | PRN
  Administered 2022-10-03: 2 mL

## 2022-10-03 MED ORDER — ARMC OPHTHALMIC DILATING DROPS
1.0000 | OPHTHALMIC | Status: DC | PRN
  Administered 2022-10-03 (×3): 1 via OPHTHALMIC

## 2022-10-03 MED ORDER — TETRACAINE HCL 0.5 % OP SOLN
1.0000 [drp] | OPHTHALMIC | Status: DC | PRN
  Administered 2022-10-03 (×3): 1 [drp] via OPHTHALMIC

## 2022-10-03 MED ORDER — MIDAZOLAM HCL 2 MG/2ML IJ SOLN
INTRAMUSCULAR | Status: DC | PRN
  Administered 2022-10-03: 1 mg via INTRAVENOUS

## 2022-10-03 MED ORDER — SIGHTPATH DOSE#1 NA CHONDROIT SULF-NA HYALURON 40-17 MG/ML IO SOLN
INTRAOCULAR | Status: DC | PRN
  Administered 2022-10-03: 1 mL via INTRAOCULAR

## 2022-10-03 MED ORDER — SIGHTPATH DOSE#1 BSS IO SOLN
INTRAOCULAR | Status: DC | PRN
  Administered 2022-10-03: 83 mL via OPHTHALMIC

## 2022-10-03 MED ORDER — MOXIFLOXACIN HCL 0.5 % OP SOLN
OPHTHALMIC | Status: DC | PRN
  Administered 2022-10-03: .2 mL via OPHTHALMIC

## 2022-10-03 MED ORDER — BRIMONIDINE TARTRATE-TIMOLOL 0.2-0.5 % OP SOLN
OPHTHALMIC | Status: DC | PRN
  Administered 2022-10-03: 1 [drp] via OPHTHALMIC

## 2022-10-03 MED ORDER — FENTANYL CITRATE (PF) 100 MCG/2ML IJ SOLN
INTRAMUSCULAR | Status: DC | PRN
  Administered 2022-10-03: 50 ug via INTRAVENOUS

## 2022-10-03 SURGICAL SUPPLY — 11 items
ANGLE REVERSE CUT SHRT 25GA (CUTTER) ×1
CATARACT SUITE SIGHTPATH (MISCELLANEOUS) ×1 IMPLANT
CYSTOTOME ANGL RVRS SHRT 25G (CUTTER) ×1 IMPLANT
CYSTOTOME ANGL RVRS SHRT 25GA (CUTTER) ×1 IMPLANT
FEE CATARACT SUITE SIGHTPATH (MISCELLANEOUS) ×1 IMPLANT
GLOVE BIOGEL PI IND STRL 8 (GLOVE) ×1 IMPLANT
GLOVE SURG ENC TEXT LTX SZ8 (GLOVE) ×1 IMPLANT
LENS IOL TECNIS EYHANCE 22.0 (Intraocular Lens) IMPLANT
NDL FILTER BLUNT 18X1 1/2 (NEEDLE) ×1 IMPLANT
NEEDLE FILTER BLUNT 18X1 1/2 (NEEDLE) ×1 IMPLANT
SYR 3ML LL SCALE MARK (SYRINGE) ×1 IMPLANT

## 2022-10-03 NOTE — H&P (Signed)
[facility]   Primary Care Physician:  Miki Kins, FNP Ophthalmologist: Dr. Druscilla Brownie  Pre-Procedure History & Physical: HPI:  [name] [name] is a 77 y.o. male here for cataract surgery.   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 post. helix. 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   CAD (coronary artery disease)    CAP (community acquired pneumonia) 07/26/2020   CKD (chronic kidney disease), stage IV (HCC)    Complete tear of right rotator cuff 11/15/2015   Complex tear of lateral meniscus of left knee as current injury 12/06/2020   Complex tear of medial meniscus of left knee as current injury 10/18/2020   COPD (chronic obstructive pulmonary disease) (HCC)    DM (diabetes mellitus), type 2 (HCC) 07/30/2020   no meds   DOE (dyspnea on exertion)    GERD (gastroesophageal reflux disease)    Gout    Grade I diastolic dysfunction    Heart murmur    HFrEF (heart failure with reduced ejection fraction) (HCC)    History of hiatal hernia    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  Performed by Dr. Zebedee Iba on 08/01/16:  29 mm commercially available Medtronic Core Valve transcatheter aortic valve procedure placed via transfemoral approach by Dr. Zebedee Iba and co-surgeon Dr. Winona Legato.   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 MOHS.   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/07/2021   Right zygoma, EDC 05/02/21   Supplemental oxygen dependent    3-4 L/[place]   Supplemental oxygen dependent    T2DM (type 2 diabetes mellitus) (HCC)    no meds    Past Surgical History:  Procedure Laterality Date   AORTIC VALVE REPLACEMENT N/A 08/01/2016   29 mm CoreValve Evolut; Location: [facility]; Surgeon: Clent Jacks, MD   CARDIAC CATHETERIZATION N/A 12/26/2012   2v CAD; retained pigtail in myocardium --> transferred to [facility]; Location: [facility]; Surgeon: Despina Hick, MD   COLONOSCOPY     KNEE ARTHROSCOPY WITH MEDIAL MENISECTOMY Left 12/02/2020   Procedure: KNEE ARTHROSCOPY WITH DEBRIDEMENT AND PARTIAL MEDIAL AND LATERAL MENISECTOMY;  Surgeon: Christena Flake, MD;  Location: [facility] ORS;  Service: Orthopedics;  Laterality: Left;   LITHOTRIPSY     PERCUTANEOUS REMOVAL INTRA-AORTIC BALLOON CATH N/A 12/26/2012   Procedure: PERCUTANEOUS REMOVAL INTRA-AORTIC BALLOON CATH; Surgeon: Heloise Ochoa, MD; Location: [facility]PER[facility]; Service: Cardiothoracic   RIGHT HEART CATH Right  07/13/2016   Location: [facility]; Surgeon: Emilio Math, MD   TRANSESOPHAGEAL ECHOCARDIOGRAM N/A 12/26/2012   Procedure: TRANSESOPHAGEAL ECHOCARDIOGRAPHY; Surgeon: Heloise Ochoa, MD; Location: [facility]; Service: Cardiothoracic   UMBILICAL HERNIA REPAIR N/A 02/13/2014   Procedure: LAPAROSCOPIC UMBILICAL HERNIA REPAIR; Surgeon: Merlinda Frederick, MD; Location: [facility]; Service: General Surgery    Prior to Admission medications   Medication Sig Start Date End Date Taking?  Authorizing Provider  acetaminophen (TYLENOL) 650 MG CR tablet Take 1,300 mg by mouth every 8 (eight) hours as needed for pain.   Yes [provider]  albuterol (VENTOLIN HFA) 108 (90 Base) MCG/ACT inhaler Inhale 2 puffs into the lungs every 6 (six) hours as needed for wheezing or shortness of breath. 07/08/14  Yes [provider]  Alcohol Swabs (DROPSAFE ALCOHOL PREP) 70 % PADS Apply 1 each topically as needed (With repatha and blood sugar checks.). 01/19/22  Yes [provider]  allopurinol (ZYLOPRIM) 100 MG tablet Take 50 mg by mouth daily. 08/08/22  Yes [provider]  Ascorbic Acid (VITAMIN C) 1000 MG tablet Take 1,000 mg by mouth at bedtime.   Yes [provider]  aspirin EC 81 MG EC tablet Take 1 tablet (81 mg total) by mouth daily. 07/11/16  Yes Ezequiel Essex, NP  busPIRone (BUSPAR) 5 MG tablet TAKE 1 TABLET BY MOUTH TWICE DAILY AS NEEDED 09/21/22  Yes Miki Kins, FNP  calcitRIOL (ROCALTROL) 0.25 MCG capsule Take 0.25 mcg by mouth daily.   Yes [provider]  citalopram (CELEXA) 40 MG tablet TAKE 1 TABLET EVERY DAY 08/31/22  Yes Miki Kins, FNP  cyclobenzaprine (FLEXERIL) 5 MG tablet Take 5 mg by mouth at bedtime as needed for muscle spasms (or cramping in legs.  May half tablet if needed). 05/20/21  Yes [provider]  Evolocumab (REPATHA SURECLICK) 140 MG/ML SOAJ INJECT CONTENT OF 1 CARTRIDGE UNDER THE SKIN EVERY OTHER WEEK 08/21/22  Yes Adrian Blackwater A, MD  ezetimibe (ZETIA) 10 MG tablet TAKE 1 TABLET(10 MG) BY MOUTH AT BEDTIME 07/07/22  Yes Miki Kins, FNP  fexofenadine (ALLEGRA) 180 MG tablet Take 180 mg by mouth daily as needed for allergies or rhinitis.   Yes [provider]  fluticasone (FLONASE) 50 MCG/ACT nasal spray Place 2 sprays into both nostrils daily. 07/11/16  Yes Ezequiel Essex, NP  Fluticasone-Umeclidin-Vilant (TRELEGY ELLIPTA) 100-62.5-25 MCG/ACT AEPB Inhale 100 mcg into the lungs daily at  6 (six) AM.   Yes [provider]  gabapentin (NEURONTIN) 400 MG capsule TAKE 1 CAPSULE AT BEDTIME 08/22/22  Yes Miki Kins, FNP  ipratropium-albuterol (DUONEB) 0.5-2.5 (3) MG/3ML SOLN Take 3 mLs by nebulization every 4 (four) hours as needed. 06/22/21  Yes Yevonne Pax, MD  losartan (COZAAR) 25 MG tablet Take 1 tablet (25 mg total) by mouth daily. 09/07/22 09/07/23 Yes Laurier Nancy, MD  metoprolol succinate (TOPROL-XL) 50 MG 24 hr tablet Take 50 mg by mouth every morning. Take with or immediately following a meal.   Yes [provider]  Multiple Vitamins-Minerals (PRESERVISION AREDS) CAPS Take by mouth.   Yes [provider]  OXYGEN Inhale 3 L into the lungs See admin instructions. Used as needed throughout the day and continuous at bedtime   Yes [provider]  pantoprazole (PROTONIX) 40 MG tablet Take 40 mg by mouth every morning.   Yes [provider]  Simethicone (GAS-X PO) Take 1 tablet by mouth 2 (two) times daily  as needed (flatulence).   Yes [provider]  sodium chloride (OCEAN) 0.65 % SOLN nasal spray Place 1 spray into both nostrils as needed for congestion. 07/11/16  Yes Ezequiel Essex, NP  SUPER B COMPLEX/C PO Take 1 tablet by mouth daily.   Yes [provider]  tamsulosin (FLOMAX) 0.4 MG CAPS capsule TAKE 1 CAPSULE EVERY DAY 07/13/22  Yes Miki Kins, FNP  torsemide (DEMADEX) 10 MG tablet Take 1 tablet (10 mg total) by mouth daily. 09/08/22 09/08/23 Yes Laurier Nancy, MD  guaiFENesin-codeine (GUAIFENESIN AC) 100-10 MG/5ML syrup Take 5 mLs by mouth 3 (three) times daily as needed for cough. 06/23/22   Miki Kins, FNP  tiotropium (SPIRIVA) 18 MCG inhalation capsule Place 18 mcg into inhaler and inhale daily. Patient not taking: Reported on 09/27/2022 09/08/22 09/08/23  [provider]    Allergies as of 09/08/2022 - Review Complete 09/07/2022  Allergen Reaction Noted   Nsaids Other (See Comments)  07/27/2020   Statins Other (See Comments) 05/27/2022   Nexletol [bempedoic acid] Diarrhea 07/27/2020    Family History  Adopted: Yes  Problem Relation Age of Onset   Varicose Veins Daughter    Obesity Daughter    Hyperlipidemia Daughter    Diabetes Daughter    COPD Daughter    Depression Daughter    Asthma Daughter    Arthritis Son    Diabetes Son    Hypertension Son     Social History   Socioeconomic History   Marital status: Married    Spouse name: Khyran Engberg   Number of children: 2   Years of education: Not on file   Highest education level: Not on file  Occupational History   Occupation: retired  Tobacco Use   Smoking status: Former    Packs/day: 2.00    Years: 54.00    Additional pack years: 0.00    Total pack years: 108.00    Types: Cigarettes    Quit date: 2010    Years since quitting: 14.4   Smokeless tobacco: Former    Types: Chew    Quit date: 05/16/2004  Vaping Use   Vaping Use: Never used  Substance and Sexual Activity   Alcohol use: No   Drug use: Never   Sexual activity: Not on file  Other Topics Concern   Not on file  Social History Narrative   Not on file   Social Determinants of Health   Financial Resource Strain: Not on file  Food Insecurity: No Food Insecurity (02/14/2022)   Hunger Vital Sign    Worried About Running Out of Food in the Last Year: Never true    Ran Out of Food in the Last Year: Never true  Transportation Needs: No Transportation Needs (02/14/2022)   PRAPARE - Administrator, Civil Service (Medical): No    Lack of Transportation (Non-Medical): No  Physical Activity: Not on file  Stress: Not on file  Social Connections: Not on file  Intimate Partner Violence: Not on file    Review of Systems: See HPI, otherwise negative ROS  Physical Exam: BP (!) 160/68   Pulse 64   Temp 97.9 F (36.6 C) (Temporal)   Resp 17   Ht 5\' 5"  (1.651 m)   Wt 91.2 kg   SpO2 99% Comment: 3L  BMI 33.46 kg/m   General:   Alert, cooperative in NAD Head:  Normocephalic and atraumatic. Respiratory:  Normal work of breathing. Cardiovascular:  RRR  Impression/Plan:  [name] [name] is here for cataract surgery.  Risks, benefits, limitations, and alternatives regarding cataract surgery have been reviewed with the patient.  Questions have been answered.  All parties agreeable.   Galen Manila, MD  10/03/2022, 11:31 AM

## 2022-10-03 NOTE — Anesthesia Postprocedure Evaluation (Signed)
Anesthesia Post Note  Patient: [name] [name]  Procedure(s) Performed: CATARACT EXTRACTION PHACO AND INTRAOCULAR LENS PLACEMENT (IOC) LEFT DIABETIC 8.22 00:55.5 (Left)  Patient location during evaluation: PACU Anesthesia Type: MAC Level of consciousness: awake and alert Pain management: pain level controlled Vital Signs Assessment: post-procedure vital signs reviewed and stable Respiratory status: spontaneous breathing, nonlabored ventilation, respiratory function stable and patient connected to nasal cannula oxygen Cardiovascular status: stable and blood pressure returned to baseline Postop Assessment: no apparent nausea or vomiting Anesthetic complications: no   No notable events documented.   Last Vitals:  Vitals:   10/03/22 1158 10/03/22 1204  BP: 134/77 134/65  Pulse: 62 62  Resp: 16 16  Temp: 36.6 C (!) 36.4 C  SpO2: 99% 97%    Last Pain:  Vitals:   10/03/22 1204  TempSrc:   PainSc: 0-No pain                 Whitley Patchen C Park Beck

## 2022-10-03 NOTE — Transfer of Care (Signed)
Immediate Anesthesia Transfer of Care Note  Patient: [name] [name]  Procedure(s) Performed: CATARACT EXTRACTION PHACO AND INTRAOCULAR LENS PLACEMENT (IOC) LEFT DIABETIC 8.22 00:55.5 (Left)  Patient Location: PACU  Anesthesia Type: MAC  Level of Consciousness: awake, alert  and patient cooperative  Airway and Oxygen Therapy: Patient Spontanous Breathing and Patient connected to supplemental oxygen  Post-op Assessment: Post-op Vital signs reviewed, Patient's Cardiovascular Status Stable, Respiratory Function Stable, Patent Airway and No signs of Nausea or vomiting  Post-op Vital Signs: Reviewed and stable  Complications: No notable events documented.

## 2022-10-03 NOTE — Op Note (Signed)
PREOPERATIVE DIAGNOSIS:  Nuclear sclerotic cataract of the left eye.   POSTOPERATIVE DIAGNOSIS:  Nuclear sclerotic cataract of the left eye.   OPERATIVE PROCEDURE:ORPROCALL@   SURGEON:  Galen Manila, MD.   ANESTHESIA:  Anesthesiologist: Marisue Humble, MD CRNA: Barbette Hair, CRNA  1.      Managed anesthesia care. 2.     0.110ml of Shugarcaine was instilled following the paracentesis   COMPLICATIONS:  None.   TECHNIQUE:   Stop and chop   DESCRIPTION OF PROCEDURE:  The patient was examined and consented in the preoperative holding area where the aforementioned topical anesthesia was applied to the left eye and then brought back to the Operating Room where the left eye was prepped and draped in the usual sterile ophthalmic fashion and a lid speculum was placed. A paracentesis was created with the side port blade and the anterior chamber was filled with viscoelastic. A near clear corneal incision was performed with the steel keratome. A continuous curvilinear capsulorrhexis was performed with a cystotome followed by the capsulorrhexis forceps. Hydrodissection and hydrodelineation were carried out with BSS on a blunt cannula. The lens was removed in a stop and chop  technique and the remaining cortical material was removed with the irrigation-aspiration handpiece. The capsular bag was inflated with viscoelastic and the Technis [id number] lens was placed in the capsular bag without complication. The remaining viscoelastic was removed from the eye with the irrigation-aspiration handpiece. The wounds were hydrated. The anterior chamber was flushed with BSS and the eye was inflated to physiologic pressure. 0.5ml Vigamox was placed in the anterior chamber. The wounds were found to be water tight. The eye was dressed with Combigan. The patient was given protective glasses to wear throughout the day and a shield with which to sleep tonight. The patient was also given drops with which to begin a drop regimen  today and will follow-up with me in one day. Implant Name Type Inv. Item Serial No. Manufacturer Lot No. LRB No. Used Action  LENS IOL TECNIS EYHANCE 22.0 - [id number] Intraocular Lens LENS IOL TECNIS EYHANCE 22.0 [id number] [employer]  Left 1 Implanted    Procedure(s) with comments: CATARACT EXTRACTION PHACO AND INTRAOCULAR LENS PLACEMENT (IOC) LEFT DIABETIC 8.22 00:55.5 (Left) - sleep apnea  Electronically signed: Galen Manila 10/03/2022 11:58 AM

## 2022-10-04 ENCOUNTER — Encounter: Payer: Self-pay | Admitting: Ophthalmology

## 2022-10-04 DIAGNOSIS — M79674 Pain in right toe(s): Secondary | ICD-10-CM | POA: Diagnosis not present

## 2022-10-04 DIAGNOSIS — M19072 Primary osteoarthritis, left ankle and foot: Secondary | ICD-10-CM | POA: Diagnosis not present

## 2022-10-04 DIAGNOSIS — M79675 Pain in left toe(s): Secondary | ICD-10-CM | POA: Diagnosis not present

## 2022-10-04 DIAGNOSIS — B351 Tinea unguium: Secondary | ICD-10-CM | POA: Diagnosis not present

## 2022-10-04 DIAGNOSIS — L84 Corns and callosities: Secondary | ICD-10-CM | POA: Diagnosis not present

## 2022-10-04 DIAGNOSIS — E1142 Type 2 diabetes mellitus with diabetic polyneuropathy: Secondary | ICD-10-CM | POA: Diagnosis not present

## 2022-10-04 DIAGNOSIS — M79672 Pain in left foot: Secondary | ICD-10-CM | POA: Diagnosis not present

## 2022-10-04 DIAGNOSIS — L603 Nail dystrophy: Secondary | ICD-10-CM | POA: Diagnosis not present

## 2022-10-04 DIAGNOSIS — M19071 Primary osteoarthritis, right ankle and foot: Secondary | ICD-10-CM | POA: Diagnosis not present

## 2022-10-04 DIAGNOSIS — M79671 Pain in right foot: Secondary | ICD-10-CM | POA: Diagnosis not present

## 2022-10-04 DIAGNOSIS — L851 Acquired keratosis [keratoderma] palmaris et plantaris: Secondary | ICD-10-CM | POA: Diagnosis not present

## 2022-10-06 ENCOUNTER — Ambulatory Visit (INDEPENDENT_AMBULATORY_CARE_PROVIDER_SITE_OTHER): Payer: Medicare HMO | Admitting: Cardiovascular Disease

## 2022-10-06 ENCOUNTER — Encounter: Payer: Self-pay | Admitting: Cardiovascular Disease

## 2022-10-06 VITALS — BP 114/56 | HR 62 | Ht 65.0 in | Wt 201.0 lb

## 2022-10-06 DIAGNOSIS — J439 Emphysema, unspecified: Secondary | ICD-10-CM | POA: Diagnosis not present

## 2022-10-06 DIAGNOSIS — Z9981 Dependence on supplemental oxygen: Secondary | ICD-10-CM | POA: Diagnosis not present

## 2022-10-06 DIAGNOSIS — I25118 Atherosclerotic heart disease of native coronary artery with other forms of angina pectoris: Secondary | ICD-10-CM

## 2022-10-06 DIAGNOSIS — I10 Essential (primary) hypertension: Secondary | ICD-10-CM

## 2022-10-06 DIAGNOSIS — E782 Mixed hyperlipidemia: Secondary | ICD-10-CM | POA: Diagnosis not present

## 2022-10-06 DIAGNOSIS — I5020 Unspecified systolic (congestive) heart failure: Secondary | ICD-10-CM | POA: Diagnosis not present

## 2022-10-06 DIAGNOSIS — G4733 Obstructive sleep apnea (adult) (pediatric): Secondary | ICD-10-CM | POA: Diagnosis not present

## 2022-10-06 NOTE — Progress Notes (Signed)
Cardiology Office Note   Date:  10/06/2022   ID:  [name], [name] 05-29-1945, MRN [id number]  PCP:  Miki Kins, FNP  Cardiologist:  Adrian Blackwater, MD      History of Present Illness: [name] [name] is a 77 y.o. male who presents for  Chief Complaint  Patient presents with   Follow-up    4 weeks follow up    Feeling much better, less SOB. Had cataract surgery, and doing well.      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 post. helix. 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   CAD (coronary artery disease)    CAP (community acquired pneumonia) 07/26/2020   CKD (chronic kidney disease), stage IV (HCC)    Complete tear of right rotator cuff 11/15/2015   Complex tear of lateral meniscus of left knee as current injury 12/06/2020   Complex tear of medial meniscus of left knee as current injury 10/18/2020   COPD (chronic obstructive pulmonary disease) (HCC)    DM (diabetes mellitus), type 2 (HCC) 07/30/2020   no meds   DOE (dyspnea on exertion)    GERD (gastroesophageal reflux disease)    Gout    Grade I diastolic dysfunction    Heart murmur    HFrEF (heart failure with reduced ejection fraction) (HCC)    History of hiatal hernia    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  Performed by Dr. Zebedee Iba on 08/01/16:  29 mm commercially available Medtronic Core Valve transcatheter aortic valve procedure placed via transfemoral approach by Dr. Zebedee Iba and co-surgeon Dr. Winona Legato.   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 MOHS.   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/07/2021   Right zygoma, EDC 05/02/21   Supplemental oxygen dependent    3-4 L/[place]   Supplemental oxygen dependent    T2DM (type 2 diabetes mellitus) (HCC)    no meds     Past Surgical History:  Procedure Laterality Date   AORTIC VALVE REPLACEMENT N/A 08/01/2016   29 mm CoreValve Evolut; Location: [facility]; Surgeon: Clent Jacks, MD   CARDIAC CATHETERIZATION N/A 12/26/2012   2v CAD; retained pigtail in myocardium --> transferred to [facility]; Location: [facility]; Surgeon: Despina Hick, MD   CATARACT EXTRACTION W/PHACO Left 10/03/2022   Procedure: CATARACT EXTRACTION PHACO AND INTRAOCULAR LENS PLACEMENT (IOC) LEFT DIABETIC 8.22 00:55.5;  Surgeon: Galen Manila, MD;  Location: [facility];  Service: Ophthalmology;  Laterality: Left;  sleep apnea   COLONOSCOPY     KNEE ARTHROSCOPY WITH MEDIAL MENISECTOMY Left 12/02/2020   Procedure: KNEE ARTHROSCOPY WITH DEBRIDEMENT AND PARTIAL MEDIAL AND LATERAL MENISECTOMY;  Surgeon: Christena Flake, MD;  Location: [facility];  Service: Orthopedics;  Laterality: Left;   LITHOTRIPSY     PERCUTANEOUS REMOVAL INTRA-AORTIC BALLOON CATH N/A 12/26/2012   Procedure: PERCUTANEOUS REMOVAL INTRA-AORTIC BALLOON CATH; Surgeon: Heloise Ochoa, MD; Location: [facility]; Service: Cardiothoracic   RIGHT HEART CATH Right 07/13/2016    Location: [facility]; Surgeon: Emilio Math, MD   TRANSESOPHAGEAL ECHOCARDIOGRAM N/A 12/26/2012   Procedure: TRANSESOPHAGEAL ECHOCARDIOGRAPHY; Surgeon: Heloise Ochoa, MD; Location: [facility]; Service: Cardiothoracic   UMBILICAL HERNIA REPAIR N/A 02/13/2014   Procedure: LAPAROSCOPIC UMBILICAL HERNIA REPAIR; Surgeon: Merlinda Frederick, MD; Location: [facility]; Service: General Surgery     Current Outpatient Medications  Medication Sig Dispense Refill   ENTRESTO 24-26 MG Take 1 tablet by mouth 2 (two) times daily.     acetaminophen (TYLENOL) 650 MG CR tablet Take 1,300 mg by mouth every 8 (eight) hours as needed for pain.     albuterol (VENTOLIN HFA) 108 (90 Base) MCG/ACT inhaler Inhale 2 puffs into the lungs every 6 (six) hours as needed for wheezing or shortness of breath.     Alcohol Swabs (DROPSAFE ALCOHOL PREP) 70 % PADS Apply 1 each topically as needed (With repatha and blood sugar checks.).     allopurinol (ZYLOPRIM) 100 MG tablet Take 50 mg by mouth daily.     Ascorbic Acid (VITAMIN C) 1000 MG tablet Take 1,000 mg by mouth at bedtime.     aspirin EC 81 MG EC tablet Take 1 tablet (81 mg total) by mouth daily.     busPIRone (BUSPAR) 5 MG tablet TAKE 1 TABLET BY MOUTH TWICE DAILY AS NEEDED 60 tablet 3   calcitRIOL (ROCALTROL) 0.25 MCG capsule Take 0.25 mcg by mouth daily.     citalopram (CELEXA) 40 MG tablet TAKE 1 TABLET EVERY DAY 90 tablet 3   cyclobenzaprine (FLEXERIL) 5 MG tablet Take 5 mg by mouth at bedtime as needed for muscle spasms (or cramping in legs.  May half tablet if needed).     Evolocumab (REPATHA SURECLICK) 140 MG/ML SOAJ INJECT CONTENT OF 1 CARTRIDGE UNDER THE SKIN EVERY OTHER WEEK 6 mL 1   ezetimibe (ZETIA) 10 MG tablet TAKE 1 TABLET(10 MG) BY MOUTH AT BEDTIME 90 tablet 3   fexofenadine (ALLEGRA) 180 MG tablet Take 180 mg by mouth daily as needed for allergies or rhinitis.     fluticasone (FLONASE) 50 MCG/ACT nasal spray Place 2 sprays into both  nostrils daily.  2   Fluticasone-Umeclidin-Vilant (TRELEGY ELLIPTA) 100-62.5-25 MCG/ACT AEPB Inhale 100 mcg into the lungs daily at 6 (six) AM.     gabapentin (NEURONTIN) 400 MG capsule TAKE 1 CAPSULE AT BEDTIME 90 capsule 3   guaiFENesin-codeine (GUAIFENESIN AC) 100-10 MG/5ML syrup Take 5 mLs by mouth 3 (three) times daily as needed for cough. 120 mL 0   ipratropium-albuterol (DUONEB) 0.5-2.5 (3) MG/3ML SOLN Take 3 mLs by nebulization every 4 (four) hours as needed. 360 mL 3   losartan (COZAAR) 25 MG tablet Take 1 tablet (25 mg total) by mouth daily. 30 tablet 11   metoprolol succinate (TOPROL-XL) 50 MG 24 hr tablet Take 50 mg by mouth every morning. Take with or immediately following a meal.     Multiple Vitamins-Minerals (  PRESERVISION AREDS) CAPS Take by mouth.     OXYGEN Inhale 3 L into the lungs See admin instructions. Used as needed throughout the day and continuous at bedtime     pantoprazole (PROTONIX) 40 MG tablet Take 40 mg by mouth every morning.     Simethicone (GAS-X PO) Take 1 tablet by mouth 2 (two) times daily as needed (flatulence).     sodium chloride (OCEAN) 0.65 % SOLN nasal spray Place 1 spray into both nostrils as needed for congestion.  0   SUPER B COMPLEX/C PO Take 1 tablet by mouth daily.     tamsulosin (FLOMAX) 0.4 MG CAPS capsule TAKE 1 CAPSULE EVERY DAY 90 capsule 3   tiotropium (SPIRIVA) 18 MCG inhalation capsule Place 18 mcg into inhaler and inhale daily. (Patient not taking: Reported on 09/27/2022)     torsemide (DEMADEX) 10 MG tablet Take 1 tablet (10 mg total) by mouth daily. 30 tablet 11   No current facility-administered medications for this visit.    Allergies:   Nsaids, Statins, and Nexletol [bempedoic acid]    Social History:   reports that he quit smoking about 14 years ago. His smoking use included cigarettes. He has a 108.00 pack-year smoking history. He quit smokeless tobacco use about 18 years ago.  His smokeless tobacco use included chew. He reports  that he does not drink alcohol and does not use drugs.   Family History:  family history includes Arthritis in his son; Asthma in his daughter; COPD in his daughter; Depression in his daughter; Diabetes in his daughter and son; Hyperlipidemia in his daughter; Hypertension in his son; Obesity in his daughter; Varicose Veins in his daughter. He was adopted.    ROS:     Review of Systems  Constitutional: Negative.   HENT: Negative.    Eyes: Negative.   Respiratory: Negative.    Gastrointestinal: Negative.   Genitourinary: Negative.   Musculoskeletal: Negative.   Skin: Negative.   Neurological: Negative.   Endo/Heme/Allergies: Negative.   Psychiatric/Behavioral: Negative.    All other systems reviewed and are negative.     All other systems are reviewed and negative.    PHYSICAL EXAM: VS:  BP (!) 114/56   Pulse 62   Ht 5\' 5"  (1.651 m)   Wt 201 lb (91.2 kg)   SpO2 92%   BMI 33.45 kg/m  , BMI Body mass index is 33.45 kg/m. Last weight:  Wt Readings from Last 3 Encounters:  10/06/22 201 lb (91.2 kg)  10/03/22 201 lb 1.6 oz (91.2 kg)  09/14/22 202 lb (91.6 kg)     Physical Exam Vitals reviewed.  Constitutional:      Appearance: Normal appearance. He is normal weight.  HENT:     Head: Normocephalic.     Nose: Nose normal.     Mouth/Throat:     Mouth: Mucous membranes are moist.  Eyes:     Pupils: Pupils are equal, round, and reactive to light.  Cardiovascular:     Rate and Rhythm: Normal rate and regular rhythm.     Pulses: Normal pulses.     Heart sounds: Normal heart sounds.  Pulmonary:     Effort: Pulmonary effort is normal.  Abdominal:     General: Abdomen is flat. Bowel sounds are normal.  Musculoskeletal:        General: Normal range of motion.     Cervical back: Normal range of motion.  Skin:    General: Skin is warm.  Neurological:  General: No focal deficit present.     Mental Status: He is alert.  Psychiatric:        Mood and Affect: Mood  normal.       EKG:   Recent Labs: 08/23/2022: ALT 7 08/28/2022: TSH 2.820 09/06/2022: BUN 76; Creatinine, Ser 3.25; Hemoglobin 10.2; Platelets 218; Potassium 4.3; Sodium 139    Lipid Panel    Component Value Date/Time   CHOL 91 (L) 08/23/2022 1056   CHOL 100 07/26/2012 0111   TRIG 136 08/23/2022 1056   TRIG 157 07/26/2012 0111   HDL 36 (L) 08/23/2022 1056   HDL 35 (L) 07/26/2012 0111   CHOLHDL 2.5 08/23/2022 1056   VLDL 31 07/26/2012 0111   LDLCALC 31 08/23/2022 1056   LDLCALC 34 07/26/2012 0111      Other studies Reviewed: Additional studies/ records that were reviewed today include:  Review of the above records demonstrates:       No data to display            ASSESSMENT AND PLAN:    ICD-10-CM   1. Coronary artery disease involving native coronary artery of native heart with other form of angina pectoris (HCC)  I25.118     2. Essential hypertension  I10     3. HFrEF (heart failure with reduced ejection fraction) (HCC)  I50.20    Doing well, compensated cHF now.    4. OSA on CPAP  G47.33     5. Pulmonary emphysema, unspecified emphysema type (HCC)  J43.9     6. Mixed hyperlipidemia  E78.2     7. On home oxygen therapy  Z99.81        Problem List Items Addressed This Visit       Cardiovascular and Mediastinum   Essential hypertension   Relevant Medications   ENTRESTO 24-26 MG   Coronary artery disease - Primary   Relevant Medications   ENTRESTO 24-26 MG   HFrEF (heart failure with reduced ejection fraction) (HCC)   Relevant Medications   ENTRESTO 24-26 MG     Respiratory   Chronic obstructive pulmonary disease (HCC)   OSA on CPAP     Other   Hyperlipidemia   Relevant Medications   ENTRESTO 24-26 MG   On home oxygen therapy       Disposition:   Return in about 2 months (around 12/06/2022).    Total time spent: 30 minutes  Signed,  Adrian Blackwater, MD  10/06/2022 10:54 AM    [facility]

## 2022-10-09 DIAGNOSIS — M1A39X0 Chronic gout due to renal impairment, multiple sites, without tophus (tophi): Secondary | ICD-10-CM | POA: Diagnosis not present

## 2022-10-09 DIAGNOSIS — N184 Chronic kidney disease, stage 4 (severe): Secondary | ICD-10-CM | POA: Diagnosis not present

## 2022-10-09 DIAGNOSIS — Z79899 Other long term (current) drug therapy: Secondary | ICD-10-CM | POA: Diagnosis not present

## 2022-10-10 ENCOUNTER — Encounter: Payer: Self-pay | Admitting: Ophthalmology

## 2022-10-12 ENCOUNTER — Other Ambulatory Visit: Payer: Self-pay | Admitting: Family

## 2022-10-12 MED ORDER — TRELEGY ELLIPTA 100-62.5-25 MCG/ACT IN AEPB: 1.0000 | INHALATION_SPRAY | Freq: Every day | RESPIRATORY_TRACT | 11 refills | Status: AC

## 2022-10-12 NOTE — Anesthesia Preprocedure Evaluation (Addendum)
Anesthesia Evaluation  Patient identified by MRN, date of birth, ID band Patient awake    Reviewed: Allergy & Precautions, H&P , NPO status , Patient's Chart, lab work & pertinent test results, reviewed documented beta blocker date and time   Airway Mallampati: IV  TM Distance: <3 FB Neck ROM: Full    Dental no notable dental hx.    Pulmonary neg pulmonary ROS, sleep apnea , pneumonia, COPD, former smoker sleep apnea , pneumonia, COPD,  oxygen dependent, former smoker  reports that he quit smoking about 14 years ago. His smoking use included cigarettes. He has a 108.00 pack-year smoking history. He quit smokeless tobacco use about 18 years ago.  His smokeless tobacco use included chew.   09-06-22 currently on his 3 L of supplementary oxygen by nasal cannula 24/7      Pulmonary exam normal breath sounds clear to auscultation       Cardiovascular hypertension, Pt. on home beta blockers and Pt. on medications + CAD and + DOE  Normal cardiovascular exam+ Valvular Problems/Murmurs  Rhythm:Regular Rate:Normal  07-28-20  (but now EF is 38% in 2024)   1. In 2022, the  Left ventricular ejection fraction, by estimation, is 50 to 55%. (Has dropped to 38% in 2024)    The left ventricle has low normal function. The left ventricle demonstrates  global hypokinesis. There is mild left ventricular hypertrophy. Left  ventricular diastolic parameters are  consistent with Grade I diastolic dysfunction (impaired relaxation).   2. Right ventricular systolic function is normal. The right ventricular  size is normal.   3. Left atrial size was mildly dilated.   4. Right atrial size was mildly dilated.   5. The mitral valve was not well visualized. Trivial mitral valve  regurgitation. Moderate mitral annular calcification.   6. The aortic valve was not well visualized. Aortic valve regurgitation  is trivial.    09-05-22            Coronary artery  disease - Primary                          Stress test 08/2022 Ischemia in the RCA territory with borderline LVEF dysfunction, advise CCTA. Kidney function decreased, unable to do CCTA. Patient denies chest pain. Will treat medically.                              echo 08/12/22 EF 38%. Patient complaining of worsening shortness of breath. Unable to change losartan to [facility] due to decreased kidney function.    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," did not see that in epic         Neuro/Psych  Headaches negative neurological ROS  negative psych ROS   GI/Hepatic Neg liver ROS, hiatal hernia,GERD  ,,  Endo/Other  diabetesHypothyroidism    Renal/GU Renal disease  negative genitourinary   Musculoskeletal negative musculoskeletal ROS (+) Arthritis ,    Abdominal   Peds negative pediatric ROS (+)  Hematology negative hematology ROS (+)   Anesthesia Other Findings T2DM (type 2 diabetes mellitus) (HCC) Hypertension Kidney stones  COPD  Basal cell carcinoma  Squamous cell carcinoma of skin Pneumonia  Heart murmur OSA on CPAP DOE (dyspnea on exertion) History of hiatal hernia  Migraines Arthritis  Hyperkalemia CAD (coronary artery disease) Aortic stenosis, 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)  CKD (  chronic kidney disease), stage IV  HLD (hyperlipidemia)  HFrEF (heart failure with reduced ejection fraction) (HCC) Supplemental oxygen dependent GERD (gastroesophageal reflux disease) BCC (basal cell carcinoma of skin) Squamous cell carcinoma of skin Respiratory failure (HCC)  Acute renal failure (ARF) (HCC) CAP (community acquired pneumonia) AKI (acute kidney injury) (HCC) DM (diabetes mellitus), type 2 (HCC) Accidental cut, puncture, perforation, or hemorrhage during heart catheterization Acute postoperative pain  ANA positive Benign hypertensive kidney disease with chronic kidney disease  Complete tear of right  rotator cuff Complex tear of medial meniscus of left knee as current injury  Personal history of other malignant neoplasm of skin Recurrent nephrolithiasis  Rotator cuff tendinitis, right S/P TAVR (transcatheter aortic valve replacement)  Complex tear of lateral meniscus of left knee as current injury Grade I diastolic dysfunction  Left atrial dilation Right atrial dilation  Gout Hypothyroidism  Supplemental oxygen dependent    Reproductive/Obstetrics negative OB ROS                              Anesthesia Physical Anesthesia Plan  ASA: 4  Anesthesia Plan: MAC   Post-op Pain Management:    Induction: Intravenous  PONV Risk Score and Plan:   Airway Management Planned: Natural Airway and Nasal Cannula  Additional Equipment:   Intra-op Plan:   Post-operative Plan:   Informed Consent: I have reviewed the patients History and Physical, chart, labs and discussed the procedure including the risks, benefits and alternatives for the proposed anesthesia with the patient or authorized representative who has indicated his/her understanding and acceptance.     Dental Advisory Given  Plan Discussed with: Anesthesiologist, CRNA and Surgeon  Anesthesia Plan Comments: (Patient consented for risks of anesthesia including but not limited to:  - adverse reactions to medications - damage to eyes, teeth, lips or other oral mucosa - nerve damage due to positioning  - sore throat or hoarseness - Damage to heart, brain, nerves, lungs, other parts of body or loss of life  Patient voiced understanding.)        Anesthesia Quick Evaluation

## 2022-10-13 NOTE — Discharge Instructions (Signed)

## 2022-10-16 ENCOUNTER — Ambulatory Visit (INDEPENDENT_AMBULATORY_CARE_PROVIDER_SITE_OTHER): Payer: Medicare HMO | Admitting: Family

## 2022-10-16 ENCOUNTER — Encounter: Payer: Self-pay | Admitting: Family

## 2022-10-16 VITALS — BP 134/74 | HR 66 | Ht 63.0 in | Wt 205.0 lb

## 2022-10-16 DIAGNOSIS — J9611 Chronic respiratory failure with hypoxia: Secondary | ICD-10-CM

## 2022-10-16 DIAGNOSIS — N184 Chronic kidney disease, stage 4 (severe): Secondary | ICD-10-CM

## 2022-10-16 DIAGNOSIS — E1165 Type 2 diabetes mellitus with hyperglycemia: Secondary | ICD-10-CM

## 2022-10-16 DIAGNOSIS — I739 Peripheral vascular disease, unspecified: Secondary | ICD-10-CM

## 2022-10-16 DIAGNOSIS — N2581 Secondary hyperparathyroidism of renal origin: Secondary | ICD-10-CM | POA: Diagnosis not present

## 2022-10-16 DIAGNOSIS — E1122 Type 2 diabetes mellitus with diabetic chronic kidney disease: Secondary | ICD-10-CM

## 2022-10-16 LAB — GLUCOSE, POCT (MANUAL RESULT ENTRY): POC Glucose: 194 mg/dl — AB (ref 70–99)

## 2022-10-16 NOTE — Assessment & Plan Note (Signed)
Patient stable.  Well controlled with current therapy.   Continue current meds.  

## 2022-10-16 NOTE — Assessment & Plan Note (Signed)
Continue current diabetes POC, as patient has been well controlled on current regimen.  Will adjust meds if needed based on labs.  

## 2022-10-16 NOTE — Progress Notes (Signed)
Established Patient Office Visit  Subjective:  Patient ID: [name] [name], male    DOB: February 18, 1946  Age: 77 y.o. MRN: [id number]  Chief Complaint  Patient presents with   Follow-up    1 month follow up    Patient is here today for his 1 month follow up.  He has been feeling fairly well since last appointment.   He does not have additional concerns to discuss today.  Labs are not due today. He needs refills.   I have reviewed his active problem list, medication list, allergies, notes from last encounter, and lab results for his appointment today.  No other concerns at this time.   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 post. helix. 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   CAD (coronary artery disease)    CAP (community acquired pneumonia) 07/26/2020   CKD (chronic kidney disease), stage IV (HCC)    Complete tear of right rotator cuff 11/15/2015   Complex tear of lateral meniscus of left knee as current injury 12/06/2020   Complex tear of medial meniscus of left knee as current injury 10/18/2020   COPD (chronic obstructive pulmonary disease) (HCC)    DM (diabetes mellitus), type 2 (HCC) 07/30/2020   no meds   DOE (dyspnea on exertion)    GERD (gastroesophageal reflux disease)    Gout    Grade I diastolic dysfunction    Heart murmur    HFrEF (heart failure with reduced ejection fraction) (HCC)    History of hiatal hernia    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  Performed by Dr. Zebedee Iba on 08/01/16:  29 mm commercially available Medtronic Core Valve transcatheter aortic valve procedure placed via transfemoral approach by Dr. Zebedee Iba and co-surgeon Dr. Winona Legato.   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 MOHS.   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/07/2021   Right zygoma, EDC 05/02/21   Supplemental oxygen dependent    3-4 L/[place]   Supplemental oxygen dependent    T2DM (type 2 diabetes mellitus) (HCC)    no meds    Past Surgical History:  Procedure Laterality Date   AORTIC VALVE REPLACEMENT N/A 08/01/2016   29 mm CoreValve Evolut; Location: [facility]; Surgeon: Clent Jacks, MD   CARDIAC CATHETERIZATION N/A 12/26/2012   2v CAD; retained pigtail in myocardium --> transferred to [facility]; Location: [facility]; Surgeon: Despina Hick, MD  CATARACT EXTRACTION W/PHACO Left 10/03/2022   Procedure: CATARACT EXTRACTION PHACO AND INTRAOCULAR LENS PLACEMENT (IOC) LEFT DIABETIC 8.22 00:55.5;  Surgeon: Galen Manila, MD;  Location: [name];  Service: Ophthalmology;  Laterality: Left;  sleep apnea   COLONOSCOPY     KNEE ARTHROSCOPY WITH MEDIAL MENISECTOMY Left 12/02/2020   Procedure: KNEE ARTHROSCOPY WITH DEBRIDEMENT AND PARTIAL MEDIAL AND LATERAL MENISECTOMY;  Surgeon: Christena Flake, MD;  Location: [facility];  Service: Orthopedics;  Laterality: Left;   LITHOTRIPSY     PERCUTANEOUS REMOVAL INTRA-AORTIC BALLOON CATH N/A 12/26/2012    Procedure: PERCUTANEOUS REMOVAL INTRA-AORTIC BALLOON CATH; Surgeon: Heloise Ochoa, MD; Location: [facility]; Service: Cardiothoracic   RIGHT HEART CATH Right 07/13/2016   Location: [facility]; Surgeon: Emilio Math, MD   TRANSESOPHAGEAL ECHOCARDIOGRAM N/A 12/26/2012   Procedure: TRANSESOPHAGEAL ECHOCARDIOGRAPHY; Surgeon: Heloise Ochoa, MD; Location: [facility]; Service: Cardiothoracic   UMBILICAL HERNIA REPAIR N/A 02/13/2014   Procedure: LAPAROSCOPIC UMBILICAL HERNIA REPAIR; Surgeon: Merlinda Frederick, MD; Location: [facility]; Service: General Surgery    Social History   Socioeconomic History   Marital status: Married    Spouse name: Kessler Solly   Number of children: 2   Years of education: Not on file   Highest education level: Not on file  Occupational History   Occupation: retired  Tobacco Use   Smoking status: Former    Packs/day: 2.00    Years: 54.00    Additional pack years: 0.00    Total pack years: 108.00    Types: Cigarettes    Quit date: 2010    Years since quitting: 14.4   Smokeless tobacco: Former    Types: Chew    Quit date: 05/16/2004  Vaping Use   Vaping Use: Never used  Substance and Sexual Activity   Alcohol use: No   Drug use: Never   Sexual activity: Not on file  Other Topics Concern   Not on file  Social History Narrative   Not on file   Social Determinants of Health   Financial Resource Strain: Not on file  Food Insecurity: No Food Insecurity (02/14/2022)   Hunger Vital Sign    Worried About Running Out of Food in the Last Year: Never true    Ran Out of Food in the Last Year: Never true  Transportation Needs: No Transportation Needs (02/14/2022)   PRAPARE - Administrator, Civil Service (Medical): No    Lack of Transportation (Non-Medical): No  Physical Activity: Not on file  Stress: Not on file  Social Connections: Not on file  Intimate Partner Violence: Not on file    Family History   Adopted: Yes  Problem Relation Age of Onset   Varicose Veins Daughter    Obesity Daughter    Hyperlipidemia Daughter    Diabetes Daughter    COPD Daughter    Depression Daughter    Asthma Daughter    Arthritis Son    Diabetes Son    Hypertension Son     Allergies  Allergen Reactions   Nsaids Other (See Comments)    Avoid due to kidney disease    Statins Other (See Comments)    Myalgias   Nexletol [Bempedoic Acid] Diarrhea    fatigue    Review of Systems  All other systems reviewed and are negative.      Objective:   Pulse 66   Ht 5\' 3"  (1.6 m)   Wt 205 lb (93 kg)   SpO2 95%  BMI 36.31 kg/m   Vitals:   10/16/22 1010  Pulse: 66  Height: 5\' 3"  (1.6 m)  Weight: 205 lb (93 kg)  SpO2: 95%  BMI (Calculated): 36.32    Physical Exam Vitals and nursing note reviewed.  Constitutional:      Appearance: Normal appearance. He is normal weight.  Eyes:     Pupils: Pupils are equal, round, and reactive to light.  Cardiovascular:     Rate and Rhythm: Normal rate and regular rhythm.     Pulses: Normal pulses.     Heart sounds: Normal heart sounds.  Pulmonary:     Effort: Pulmonary effort is normal.     Breath sounds: Normal breath sounds.  Neurological:     Mental Status: He is alert.  Psychiatric:        Mood and Affect: Mood normal.        Behavior: Behavior normal.      Results for orders placed or performed in visit on 10/16/22  POCT Glucose (CBG)  Result Value Ref Range   POC Glucose 194 (A) 70 - 99 mg/dl    Recent Results (from the past 2160 hour(s))  TSH     Status: Abnormal   Collection Time: 08/23/22 10:56 AM  Result Value Ref Range   TSH 5.010 (H) 0.450 - 4.500 uIU/mL  Lipid panel     Status: Abnormal   Collection Time: 08/23/22 10:56 AM  Result Value Ref Range   Cholesterol, Total 91 (L) 100 - 199 mg/dL   Triglycerides 295 0 - 149 mg/dL   HDL 36 (L) >18 mg/dL   VLDL Cholesterol Cal 24 5 - 40 mg/dL   LDL Chol Calc (NIH) 31 0 - 99 mg/dL    Chol/HDL Ratio 2.5 0.0 - 5.0 ratio    Comment:                                   T. Chol/HDL Ratio                                             Men  Women                               1/2 Avg.Risk  3.4    3.3                                   Avg.Risk  5.0    4.4                                2X Avg.Risk  9.6    7.1                                3X Avg.Risk 23.4   11.0   Hemoglobin A1c     Status: None   Collection Time: 08/23/22 10:56 AM  Result Value Ref Range   Hgb A1c MFr Bld 5.5 4.8 - 5.6 %    Comment:          Prediabetes: 5.7 - 6.4  Diabetes: >6.4          Glycemic control for adults with diabetes: <7.0    Est. average glucose Bld gHb Est-mCnc 111 mg/dL  [id number]     Status: Abnormal   Collection Time: 08/23/22 10:56 AM  Result Value Ref Range   Glucose 112 (H) 70 - 99 mg/dL   BUN 45 (H) 8 - 27 mg/dL   Creatinine, Ser 7.82 (H) 0.76 - 1.27 mg/dL   eGFR 18 (L) >95 AO/ZHY/8.65   BUN/Creatinine Ratio 13 10 - 24   Sodium 145 (H) 134 - 144 mmol/L   Potassium 5.4 (H) 3.5 - 5.2 mmol/L   Chloride 107 (H) 96 - 106 mmol/L   CO2 19 (L) 20 - 29 mmol/L   Calcium 9.1 8.6 - 10.2 mg/dL   Total Protein 6.9 6.0 - 8.5 g/dL   Albumin 3.8 3.8 - 4.8 g/dL   Globulin, Total 3.1 1.5 - 4.5 g/dL   Albumin/Globulin Ratio 1.2 1.2 - 2.2   Bilirubin Total 0.4 0.0 - 1.2 mg/dL   Alkaline Phosphatase 99 44 - 121 IU/L   AST 13 0 - 40 IU/L   ALT 7 0 - 44 IU/L  CBC With Differential     Status: Abnormal   Collection Time: 08/23/22 10:56 AM  Result Value Ref Range   WBC 8.8 3.4 - 10.8 x10E3/uL   RBC 3.62 (L) 4.14 - 5.80 x10E6/uL   Hemoglobin 11.5 (L) 13.0 - 17.7 g/dL   Hematocrit 78.4 (L) 69.6 - 51.0 %   MCV 95 79 - 97 fL   MCH 31.8 26.6 - 33.0 pg   MCHC 33.4 31.5 - 35.7 g/dL   RDW 29.5 28.4 - 13.2 %   Neutrophils 69 Not Estab. %   Lymphs 21 Not Estab. %   Monocytes 8 Not Estab. %   Eos 0 Not Estab. %   Basos 0 Not Estab. %   Neutrophils Absolute 6.1 1.4 - 7.0 x10E3/uL    Lymphocytes Absolute 1.8 0.7 - 3.1 x10E3/uL   Monocytes Absolute 0.7 0.1 - 0.9 x10E3/uL   EOS (ABSOLUTE) 0.0 0.0 - 0.4 x10E3/uL   Basophils Absolute 0.0 0.0 - 0.2 x10E3/uL   Immature Granulocytes 2 Not Estab. %   Immature Grans (Abs) 0.2 (H) 0.0 - 0.1 x10E3/uL    Comment: (An elevated percentage of Immature Granulocytes has not been found to be clinically significant as a sole clinical predictor of disease. Does NOT include bands or blast cells.  Pregnancy associated physiological leukocytosis may also show increased immature granulocytes without clinical significance.)   POCT CBG (Fasting - Glucose)     Status: Abnormal   Collection Time: 08/28/22 10:10 AM  Result Value Ref Range   Glucose Fasting, POC 172 (A) 70 - 99 mg/dL  Uric acid     Status: Abnormal   Collection Time: 08/28/22 11:14 AM  Result Value Ref Range   Uric Acid 9.1 (H) 3.8 - 8.4 mg/dL    Comment:            Therapeutic target for gout patients: <6.0  TSH+T4F+T3Free     Status: None   Collection Time: 08/28/22 11:14 AM  Result Value Ref Range   TSH 2.820 0.450 - 4.500 uIU/mL   T3, Free 2.3 2.0 - 4.4 pg/mL   Free T4 1.06 0.82 - 1.77 ng/dL  Basic metabolic panel     Status: Abnormal   Collection Time: 09/06/22  8:30 PM  Result Value Ref Range   Sodium 139  135 - 145 mmol/L   Potassium 4.3 3.5 - 5.1 mmol/L   Chloride 107 98 - 111 mmol/L   CO2 22 22 - 32 mmol/L   Glucose, Bld 122 (H) 70 - 99 mg/dL    Comment: Glucose reference range applies only to samples taken after fasting for at least 8 hours.   BUN 76 (H) 8 - 23 mg/dL   Creatinine, Ser 6.57 (H) 0.61 - 1.24 mg/dL   Calcium 8.5 (L) 8.9 - 10.3 mg/dL   GFR, Estimated 19 (L) >60 mL/min    Comment: (NOTE) Calculated using the CKD-EPI Creatinine Equation (2021)    Anion gap 10 5 - 15    Comment: Performed at [facility], [address] [place], [place] [address]  CBC     Status: Abnormal   Collection Time: 09/06/22  8:30 PM  Result Value Ref Range    WBC 8.3 4.0 - 10.5 K/uL   RBC 3.19 (L) 4.22 - 5.81 MIL/uL   Hemoglobin 10.2 (L) 13.0 - 17.0 g/dL   HCT 29.5 (L) 28.4 - 13.2 %   MCV 98.4 80.0 - 100.0 fL   MCH 32.0 26.0 - 34.0 pg   MCHC 32.5 30.0 - 36.0 g/dL   RDW 44.0 10.2 - 72.5 %   Platelets 218 150 - 400 K/uL   nRBC 0.0 0.0 - 0.2 %    Comment: Performed at [facility], [address] [place], [place] [address]  Troponin I (High Sensitivity)     Status: None   Collection Time: 09/06/22  8:30 PM  Result Value Ref Range   Troponin I (High Sensitivity) 11 <18 ng/L    Comment: (NOTE) Elevated high sensitivity troponin I (hsTnI) values and significant  changes across serial measurements may suggest ACS but many other  chronic and acute conditions are known to elevate hsTnI results.  Refer to the "Links" section for chest pain algorithms and additional  guidance. Performed at [facility], [address] [place], [place] [address]   Troponin I (High Sensitivity)     Status: None   Collection Time: 09/06/22 11:19 PM  Result Value Ref Range   Troponin I (High Sensitivity) 10 <18 ng/L    Comment: (NOTE) Elevated high sensitivity troponin I (hsTnI) values and significant  changes across serial measurements may suggest ACS but many other  chronic and acute conditions are known to elevate hsTnI results.  Refer to the "Links" section for chest pain algorithms and additional  guidance. Performed at [facility], [address] [place], [place] [address]   Glucose, capillary     Status: None   Collection Time: 10/03/22 11:08 AM  Result Value Ref Range   Glucose-Capillary 99 70 - 99 mg/dL    Comment: Glucose reference range applies only to samples taken after fasting for at least 8 hours.  POCT Glucose (CBG)     Status: Abnormal   Collection Time: 10/16/22 10:20 AM  Result Value Ref Range   POC Glucose 194 (A) 70 - 99 mg/dl       Assessment & Plan:   Problem List Items Addressed This Visit        Active Problems   Chronic respiratory failure with hypoxia [facility])    Patient is stable.  Continue oxygen therapy.        Secondary hyperparathyroidism of renal origin [facility])    Patient stable.  Well controlled with current therapy.   Continue current meds.  Type 2 diabetes mellitus with diabetic chronic kidney disease (HCC)    Continue current diabetes POC, as patient has been well controlled on current regimen.  Will adjust meds if needed based on labs.       Relevant Orders   POCT Glucose (CBG) (Completed)   Claudication of both lower extremities (HCC)    Patient stable.  Well controlled with current therapy.   Continue current meds.       Other Visit Diagnoses     Type 2 diabetes mellitus with hyperglycemia, without long-term current use of insulin (HCC)    -  Primary       Return in about 3 months (around 01/16/2023).   Total time spent: 20 minutes  Miki Kins, FNP  10/16/2022   This document may have been prepared by [facility] Voice Recognition software and as such may include unintentional dictation errors.

## 2022-10-16 NOTE — Assessment & Plan Note (Signed)
>>  ASSESSMENT AND PLAN FOR TYPE 2 DIABETES MELLITUS WITH DIABETIC CHRONIC KIDNEY DISEASE (HCC) WRITTEN ON 10/16/2022  7:04 PM BY Evander Hills M, FNP  Continue current diabetes POC, as patient has been well controlled on current regimen.  Will adjust meds if needed based on labs.

## 2022-10-16 NOTE — Assessment & Plan Note (Signed)
Patient is stable.  Continue oxygen therapy.

## 2022-10-17 ENCOUNTER — Ambulatory Visit: Payer: Medicare HMO | Admitting: Anesthesiology

## 2022-10-17 ENCOUNTER — Other Ambulatory Visit: Payer: Self-pay

## 2022-10-17 ENCOUNTER — Encounter: Payer: Self-pay | Admitting: Ophthalmology

## 2022-10-17 ENCOUNTER — Encounter
Admission: RE | Disposition: A | Discharge status: Home / Self Care | Payer: Self-pay | Source: Home / Self Care | Attending: Ophthalmology

## 2022-10-17 ENCOUNTER — Ambulatory Visit
Admission: RE | Admit: 2022-10-17 | Discharge: 2022-10-17 | Disposition: A | Discharge status: Home / Self Care | Payer: Medicare HMO | Attending: Ophthalmology | Admitting: Ophthalmology

## 2022-10-17 DIAGNOSIS — I130 Hypertensive heart and chronic kidney disease with heart failure and stage 1 through stage 4 chronic kidney disease, or unspecified chronic kidney disease: Secondary | ICD-10-CM | POA: Diagnosis not present

## 2022-10-17 DIAGNOSIS — Z87891 Personal history of nicotine dependence: Secondary | ICD-10-CM | POA: Diagnosis not present

## 2022-10-17 DIAGNOSIS — I5022 Chronic systolic (congestive) heart failure: Secondary | ICD-10-CM | POA: Insufficient documentation

## 2022-10-17 DIAGNOSIS — E1136 Type 2 diabetes mellitus with diabetic cataract: Secondary | ICD-10-CM | POA: Insufficient documentation

## 2022-10-17 DIAGNOSIS — Z8249 Family history of ischemic heart disease and other diseases of the circulatory system: Secondary | ICD-10-CM | POA: Insufficient documentation

## 2022-10-17 DIAGNOSIS — E1122 Type 2 diabetes mellitus with diabetic chronic kidney disease: Secondary | ICD-10-CM | POA: Diagnosis not present

## 2022-10-17 DIAGNOSIS — I2510 Atherosclerotic heart disease of native coronary artery without angina pectoris: Secondary | ICD-10-CM | POA: Diagnosis not present

## 2022-10-17 DIAGNOSIS — E785 Hyperlipidemia, unspecified: Secondary | ICD-10-CM | POA: Diagnosis not present

## 2022-10-17 DIAGNOSIS — H2511 Age-related nuclear cataract, right eye: Secondary | ICD-10-CM | POA: Diagnosis not present

## 2022-10-17 DIAGNOSIS — N184 Chronic kidney disease, stage 4 (severe): Secondary | ICD-10-CM | POA: Insufficient documentation

## 2022-10-17 DIAGNOSIS — Z833 Family history of diabetes mellitus: Secondary | ICD-10-CM | POA: Insufficient documentation

## 2022-10-17 HISTORY — PX: CATARACT EXTRACTION W/PHACO: SHX586

## 2022-10-17 SURGERY — PHACOEMULSIFICATION, CATARACT, WITH IOL INSERTION
Anesthesia: Monitor Anesthesia Care | Site: Eye | Laterality: Right

## 2022-10-17 MED ORDER — LACTATED RINGERS IV SOLN: INTRAVENOUS | Status: DC

## 2022-10-17 MED ORDER — BRIMONIDINE TARTRATE-TIMOLOL 0.2-0.5 % OP SOLN
OPHTHALMIC | Status: DC | PRN
  Administered 2022-10-17: 1 [drp] via OPHTHALMIC

## 2022-10-17 MED ORDER — MIDAZOLAM HCL 2 MG/2ML IJ SOLN
INTRAMUSCULAR | Status: DC | PRN
  Administered 2022-10-17: 1 mg via INTRAVENOUS

## 2022-10-17 MED ORDER — MOXIFLOXACIN HCL 0.5 % OP SOLN
OPHTHALMIC | Status: DC | PRN
  Administered 2022-10-17: .2 mL via OPHTHALMIC

## 2022-10-17 MED ORDER — SIGHTPATH DOSE#1 BSS IO SOLN
INTRAOCULAR | Status: DC | PRN
  Administered 2022-10-17: 1 mL

## 2022-10-17 MED ORDER — FENTANYL CITRATE (PF) 100 MCG/2ML IJ SOLN
INTRAMUSCULAR | Status: DC | PRN
  Administered 2022-10-17: 50 ug via INTRAVENOUS

## 2022-10-17 MED ORDER — SIGHTPATH DOSE#1 BSS IO SOLN
INTRAOCULAR | Status: DC | PRN
  Administered 2022-10-17: 78 mL via OPHTHALMIC

## 2022-10-17 MED ORDER — SIGHTPATH DOSE#1 NA CHONDROIT SULF-NA HYALURON 40-17 MG/ML IO SOLN
INTRAOCULAR | Status: DC | PRN
  Administered 2022-10-17: 1 mL via INTRAOCULAR

## 2022-10-17 MED ORDER — ARMC OPHTHALMIC DILATING DROPS
1.0000 | OPHTHALMIC | Status: DC | PRN
  Administered 2022-10-17 (×3): 1 via OPHTHALMIC

## 2022-10-17 MED ORDER — TETRACAINE HCL 0.5 % OP SOLN
1.0000 [drp] | OPHTHALMIC | Status: DC | PRN
  Administered 2022-10-17 (×3): 1 [drp] via OPHTHALMIC

## 2022-10-17 MED ORDER — SIGHTPATH DOSE#1 BSS IO SOLN
INTRAOCULAR | Status: DC | PRN
  Administered 2022-10-17: 15 mL

## 2022-10-17 SURGICAL SUPPLY — 17 items
ANGLE REVERSE CUT SHRT 25GA (CUTTER) ×1
CANNULA ANT/CHMB 27G (MISCELLANEOUS) IMPLANT
CANNULA ANT/CHMB 27GA (MISCELLANEOUS) IMPLANT
CATARACT SUITE SIGHTPATH (MISCELLANEOUS) ×1 IMPLANT
CYSTOTOME ANGL RVRS SHRT 25G (CUTTER) ×1 IMPLANT
CYSTOTOME ANGL RVRS SHRT 25GA (CUTTER) ×1 IMPLANT
FEE CATARACT SUITE SIGHTPATH (MISCELLANEOUS) ×1 IMPLANT
GLOVE BIOGEL PI IND STRL 8 (GLOVE) ×1 IMPLANT
GLOVE SURG ENC TEXT LTX SZ8 (GLOVE) ×1 IMPLANT
LENS IOL TECNIS EYHANCE 22.0 (Intraocular Lens) IMPLANT
NDL FILTER BLUNT 18X1 1/2 (NEEDLE) ×1 IMPLANT
NEEDLE FILTER BLUNT 18X1 1/2 (NEEDLE) ×1 IMPLANT
PACK VIT ANT 23G (MISCELLANEOUS) IMPLANT
RING MALYGIN (MISCELLANEOUS) IMPLANT
SUT ETHILON 10-0 CS-B-6CS-B-6 (SUTURE)
SUTURE EHLN 10-0 CS-B-6CS-B-6 (SUTURE) IMPLANT
SYR 3ML LL SCALE MARK (SYRINGE) ×1 IMPLANT

## 2022-10-17 NOTE — H&P (Signed)
[facility]   Primary Care Physician:  Miki Kins, FNP Ophthalmologist: Dr. Druscilla Brownie  Pre-Procedure History & Physical: HPI:  [name] [name] is a 77 y.o. male here for cataract surgery.   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 post. helix. 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   CAD (coronary artery disease)    CAP (community acquired pneumonia) 07/26/2020   CKD (chronic kidney disease), stage IV (HCC)    Complete tear of right rotator cuff 11/15/2015   Complex tear of lateral meniscus of left knee as current injury 12/06/2020   Complex tear of medial meniscus of left knee as current injury 10/18/2020   COPD (chronic obstructive pulmonary disease) (HCC)    DM (diabetes mellitus), type 2 (HCC) 07/30/2020   no meds   DOE (dyspnea on exertion)    GERD (gastroesophageal reflux disease)    Gout    Grade I diastolic dysfunction    Heart murmur    HFrEF (heart failure with reduced ejection fraction) (HCC)    History of hiatal hernia    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  Performed by Dr. Zebedee Iba on 08/01/16:  29 mm commercially available Medtronic Core Valve transcatheter aortic valve procedure placed via transfemoral approach by Dr. Zebedee Iba and co-surgeon Dr. Winona Legato.   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 MOHS.   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/07/2021   Right zygoma, EDC 05/02/21   Supplemental oxygen dependent    3-4 L/Morse   Supplemental oxygen dependent    T2DM (type 2 diabetes mellitus) (HCC)    no meds    Past Surgical History:  Procedure Laterality Date   AORTIC VALVE REPLACEMENT N/A 08/01/2016   29 mm CoreValve Evolut; Location: [facility]; Surgeon: Clent Jacks, MD   CARDIAC CATHETERIZATION N/A 12/26/2012   2v CAD; retained pigtail in myocardium --> transferred to [facility]; Location: [facility]; Surgeon: Despina Hick, MD   CATARACT EXTRACTION W/PHACO Left 10/03/2022   Procedure: CATARACT EXTRACTION PHACO AND INTRAOCULAR LENS PLACEMENT (IOC) LEFT DIABETIC 8.22 00:55.5;  Surgeon: Galen Manila, MD;  Location: [facility];  Service: Ophthalmology;  Laterality: Left;  sleep apnea   COLONOSCOPY     KNEE ARTHROSCOPY WITH MEDIAL MENISECTOMY Left 12/02/2020   Procedure: KNEE ARTHROSCOPY WITH DEBRIDEMENT AND PARTIAL MEDIAL AND LATERAL MENISECTOMY;  Surgeon: Christena Flake, MD;  Location: [facility];  Service: Orthopedics;  Laterality: Left;   LITHOTRIPSY     PERCUTANEOUS REMOVAL INTRA-AORTIC BALLOON CATH N/A 12/26/2012   Procedure: PERCUTANEOUS REMOVAL INTRA-AORTIC BALLOON CATH; Surgeon: Heloise Ochoa, MD; Location: [facility]; Service: Cardiothoracic   RIGHT HEART CATH Right 07/13/2016   Location: [facility]; Surgeon: Emilio Math, MD   TRANSESOPHAGEAL ECHOCARDIOGRAM N/A 12/26/2012   Procedure: TRANSESOPHAGEAL ECHOCARDIOGRAPHY; Surgeon: Heloise Ochoa, MD; Location: [facility]; Service:  Cardiothoracic   UMBILICAL HERNIA REPAIR N/A 02/13/2014   Procedure: LAPAROSCOPIC UMBILICAL HERNIA REPAIR; Surgeon: Merlinda Frederick, MD; Location: [facility]; Service: General Surgery    Prior to Admission medications   Medication Sig Start Date End Date Taking? Authorizing Provider  acetaminophen (TYLENOL) 650 MG CR tablet Take 1,300 mg by mouth every 8 (eight) hours as needed for pain.   Yes [provider]  albuterol (VENTOLIN HFA) 108 (90 Base) MCG/ACT inhaler Inhale 2 puffs into the lungs every 6 (six) hours as needed for wheezing or shortness of breath. 07/08/14  Yes [provider]  Alcohol Swabs (DROPSAFE ALCOHOL PREP) 70 % PADS Apply 1 each topically as needed (With repatha and blood sugar checks.). 01/19/22  Yes [provider]  Ascorbic Acid (VITAMIN C) 1000 MG tablet Take 1,000 mg by mouth at bedtime.   Yes [provider]  aspirin EC 81 MG EC tablet Take 1 tablet (81 mg total) by mouth daily. 07/11/16  Yes Ezequiel Essex, NP  busPIRone (BUSPAR) 5 MG tablet TAKE 1 TABLET BY MOUTH TWICE DAILY AS NEEDED 09/21/22  Yes Miki Kins, FNP  calcitRIOL (ROCALTROL) 0.25 MCG capsule Take 0.25 mcg by mouth daily.   Yes [provider]  citalopram (CELEXA) 40 MG tablet TAKE 1 TABLET EVERY DAY 08/31/22  Yes Miki Kins, FNP  cyclobenzaprine (FLEXERIL) 5 MG tablet Take 5 mg by mouth at bedtime as needed for muscle spasms (or cramping in legs.  May half tablet if needed). 05/20/21  Yes [provider]  ENTRESTO 24-26 MG Take 1 tablet by mouth 2 (two) times daily. 08/21/22  Yes [provider]  Evolocumab (REPATHA SURECLICK) 140 MG/ML SOAJ INJECT CONTENT OF 1 CARTRIDGE UNDER THE SKIN EVERY OTHER WEEK 08/21/22  Yes Adrian Blackwater A, MD  ezetimibe (ZETIA) 10 MG tablet TAKE 1 TABLET(10 MG) BY MOUTH AT BEDTIME 07/07/22  Yes Miki Kins, FNP  febuxostat (ULORIC) 40 MG tablet Take 40 mg by mouth daily. 10/09/22  Yes [provider]  fexofenadine (ALLEGRA) 180 MG tablet Take 180 mg by mouth daily as needed for allergies or rhinitis.   Yes [provider]  fluticasone (FLONASE) 50 MCG/ACT nasal spray Place 2 sprays into both nostrils daily. 07/11/16  Yes Ezequiel Essex, NP  Fluticasone-Umeclidin-Vilant (TRELEGY ELLIPTA) 100-62.5-25 MCG/ACT AEPB Inhale 1 Inhalation into the lungs daily at 6 (six) AM. 10/12/22  Yes Miki Kins, FNP  gabapentin (NEURONTIN) 400 MG capsule TAKE 1 CAPSULE AT BEDTIME 08/22/22  Yes Miki Kins, FNP  guaiFENesin-codeine (GUAIFENESIN AC) 100-10 MG/5ML syrup Take 5 mLs by mouth 3 (three) times daily as needed for cough. 06/23/22  Yes Miki Kins, FNP  ipratropium-albuterol (DUONEB) 0.5-2.5 (3) MG/3ML SOLN Take 3 mLs by nebulization every 4 (four) hours as needed. 06/22/21  Yes Yevonne Pax, MD  losartan (COZAAR) 25 MG tablet Take 1 tablet (25 mg total) by mouth daily. 09/07/22 09/07/23 Yes Laurier Nancy, MD  metoprolol succinate (TOPROL-XL) 50 MG 24 hr tablet Take 50 mg by mouth every  morning. Take with or immediately following a meal.   Yes [provider]  Multiple Vitamins-Minerals (PRESERVISION AREDS) CAPS Take by mouth.   Yes [provider]  pantoprazole (PROTONIX) 40 MG tablet Take 40 mg by mouth every morning.   Yes [provider]  Simethicone (GAS-X PO) Take 1 tablet by mouth 2 (two) times daily as needed (flatulence).   Yes [provider]  sodium chloride (OCEAN) 0.65 % SOLN nasal spray Place 1 spray into both nostrils as needed for congestion. 07/11/16  Yes Ezequiel Essex, NP  SUPER B COMPLEX/C PO Take 1 tablet by mouth daily.   Yes [provider]  tamsulosin (FLOMAX) 0.4 MG CAPS capsule TAKE 1 CAPSULE EVERY DAY 07/13/22  Yes Miki Kins, FNP  torsemide (DEMADEX) 10 MG tablet Take 1 tablet (10 mg total) by mouth daily. 09/08/22 09/08/23 Yes Laurier Nancy, MD  OXYGEN Inhale 3 L into the lungs See admin  instructions. Used as needed throughout the day and continuous at bedtime    [provider]    Allergies as of 09/08/2022 - Review Complete 09/07/2022  Allergen Reaction Noted   Nsaids Other (See Comments) 07/27/2020   Statins Other (See Comments) 05/27/2022   Nexletol [bempedoic acid] Diarrhea 07/27/2020    Family History  Adopted: Yes  Problem Relation Age of Onset   Varicose Veins Daughter    Obesity Daughter    Hyperlipidemia Daughter    Diabetes Daughter    COPD Daughter    Depression Daughter    Asthma Daughter    Arthritis Son    Diabetes Son    Hypertension Son     Social History   Socioeconomic History   Marital status: Married    Spouse name: Nolin Grell   Number of children: 2   Years of education: Not on file   Highest education level: Not on file  Occupational History   Occupation: retired  Tobacco Use   Smoking status: Former    Packs/day: 2.00    Years: 54.00    Additional pack years: 0.00    Total pack years: 108.00    Types: Cigarettes    Quit date: 2010    Years since quitting: 14.4   Smokeless tobacco: Former    Types: Chew    Quit date: 05/16/2004  Vaping Use   Vaping Use: Never used  Substance and Sexual Activity   Alcohol use: No   Drug use: Never   Sexual activity: Not on file  Other Topics Concern   Not on file  Social History Narrative   Not on file   Social Determinants of Health   Financial Resource Strain: Not on file  Food Insecurity: No Food Insecurity (02/14/2022)   Hunger Vital Sign    Worried About Running Out of Food in the Last Year: Never true    Ran Out of Food in the Last Year: Never true  Transportation Needs: No Transportation Needs (02/14/2022)   PRAPARE - Administrator, Civil Service (Medical): No    Lack of Transportation (Non-Medical): No  Physical Activity: Not on file  Stress: Not on file  Social Connections: Not on file  Intimate Partner Violence: Not on file    Review  of Systems: See HPI, otherwise negative ROS  Physical Exam: BP (!) 152/60   Pulse (!) 54   Temp (!) 97.3 F (36.3 C) (Temporal)   Resp 18   Ht 5\' 3"  (1.6 m)   Wt  91.2 kg   SpO2 96%   BMI 35.62 kg/m  General:   Alert, cooperative in NAD Head:  Normocephalic and atraumatic. Respiratory:  Normal work of breathing. Cardiovascular:  RRR  Impression/Plan: [name] [name] is here for cataract surgery.  Risks, benefits, limitations, and alternatives regarding cataract surgery have been reviewed with the patient.  Questions have been answered.  All parties agreeable.   Galen Manila, MD  10/17/2022, 9:58 AM

## 2022-10-17 NOTE — Transfer of Care (Signed)
Immediate Anesthesia Transfer of Care Note  Patient: [name] [name]  Procedure(s) Performed: CATARACT EXTRACTION PHACO AND INTRAOCULAR LENS PLACEMENT (IOC) RIGHT DIABETIC (Right: Eye)  Patient Location: PACU  Anesthesia Type: MAC  Level of Consciousness: awake, alert  and patient cooperative  Airway and Oxygen Therapy: Patient Spontanous Breathing and Patient connected to supplemental oxygen  Post-op Assessment: Post-op Vital signs reviewed, Patient's Cardiovascular Status Stable, Respiratory Function Stable, Patent Airway and No signs of Nausea or vomiting  Post-op Vital Signs: Reviewed and stable  Complications: No notable events documented.

## 2022-10-17 NOTE — Anesthesia Postprocedure Evaluation (Signed)
Anesthesia Post Note  Patient: [name] [name]  Procedure(s) Performed: CATARACT EXTRACTION PHACO AND INTRAOCULAR LENS PLACEMENT (IOC) RIGHT DIABETIC (Right: Eye)  Patient location during evaluation: PACU Anesthesia Type: MAC Level of consciousness: awake and alert Pain management: pain level controlled Vital Signs Assessment: post-procedure vital signs reviewed and stable Respiratory status: spontaneous breathing, nonlabored ventilation, respiratory function stable and patient connected to nasal cannula oxygen Cardiovascular status: stable and blood pressure returned to baseline Postop Assessment: no apparent nausea or vomiting Anesthetic complications: no   No notable events documented.   Last Vitals:  Vitals:   10/17/22 1025 10/17/22 1030  BP: (!) 144/59 (!) 143/63  Pulse: (!) 57 (!) 56  Resp: 15 15  Temp: (!) 36.3 C (!) 36.3 C  SpO2: 97% 97%    Last Pain:  Vitals:   10/17/22 1030  TempSrc:   PainSc: 0-No pain                 [name]

## 2022-10-17 NOTE — Op Note (Signed)
PREOPERATIVE DIAGNOSIS:  Nuclear sclerotic cataract of the right eye.   POSTOPERATIVE DIAGNOSIS:  Cataract   OPERATIVE PROCEDURE:ORPROCALL@   SURGEON:  Galen Manila, MD.   ANESTHESIA:  Anesthesiologist: Marisue Humble, MD CRNA: Barbette Hair, CRNA  1.      Managed anesthesia care. 2.      0.23ml of Shugarcaine was instilled in the eye following the paracentesis.   COMPLICATIONS:  None.   TECHNIQUE:   Stop and chop   DESCRIPTION OF PROCEDURE:  The patient was examined and consented in the preoperative holding area where the aforementioned topical anesthesia was applied to the right eye and then brought back to the Operating Room where the right eye was prepped and draped in the usual sterile ophthalmic fashion and a lid speculum was placed. A paracentesis was created with the side port blade and the anterior chamber was filled with viscoelastic. A near clear corneal incision was performed with the steel keratome. A continuous curvilinear capsulorrhexis was performed with a cystotome followed by the capsulorrhexis forceps. Hydrodissection and hydrodelineation were carried out with BSS on a blunt cannula. The lens was removed in a stop and chop  technique and the remaining cortical material was removed with the irrigation-aspiration handpiece. The capsular bag was inflated with viscoelastic and the Technis [id number]  lens was placed in the capsular bag without complication. The remaining viscoelastic was removed from the eye with the irrigation-aspiration handpiece. The wounds were hydrated. The anterior chamber was flushed with BSS and the eye was inflated to physiologic pressure. 0.37ml of Vigamox was placed in the anterior chamber. The wounds were found to be water tight. The eye was dressed with Combigan. The patient was given protective glasses to wear throughout the day and a shield with which to sleep tonight. The patient was also given drops with which to begin a drop regimen today and will  follow-up with me in one day. Implant Name Type Inv. Item Serial No. Manufacturer Lot No. LRB No. Used Action  LENS IOL TECNIS EYHANCE 22.0 - [id number] Intraocular Lens LENS IOL TECNIS EYHANCE 22.0 [id number] [employer]  Right 1 Implanted   Procedure(s) with comments: CATARACT EXTRACTION PHACO AND INTRAOCULAR LENS PLACEMENT (IOC) RIGHT DIABETIC (Right) - 4.79 0:37.4  Electronically signed: Galen Manila 10/17/2022 10:23 AM

## 2022-10-18 ENCOUNTER — Encounter: Payer: Self-pay | Admitting: Ophthalmology

## 2022-10-20 ENCOUNTER — Other Ambulatory Visit: Payer: Self-pay

## 2022-10-20 DIAGNOSIS — H353221 Exudative age-related macular degeneration, left eye, with active choroidal neovascularization: Secondary | ICD-10-CM | POA: Diagnosis not present

## 2022-10-20 DIAGNOSIS — D631 Anemia in chronic kidney disease: Secondary | ICD-10-CM

## 2022-10-20 DIAGNOSIS — D509 Iron deficiency anemia, unspecified: Secondary | ICD-10-CM

## 2022-10-23 ENCOUNTER — Inpatient Hospital Stay: Payer: Medicare HMO | Attending: Oncology

## 2022-10-23 DIAGNOSIS — D631 Anemia in chronic kidney disease: Secondary | ICD-10-CM | POA: Insufficient documentation

## 2022-10-23 DIAGNOSIS — N2581 Secondary hyperparathyroidism of renal origin: Secondary | ICD-10-CM | POA: Diagnosis not present

## 2022-10-23 DIAGNOSIS — N050 Unspecified nephritic syndrome with minor glomerular abnormality: Secondary | ICD-10-CM | POA: Diagnosis not present

## 2022-10-23 DIAGNOSIS — N184 Chronic kidney disease, stage 4 (severe): Secondary | ICD-10-CM | POA: Diagnosis not present

## 2022-10-23 DIAGNOSIS — N400 Enlarged prostate without lower urinary tract symptoms: Secondary | ICD-10-CM | POA: Insufficient documentation

## 2022-10-23 DIAGNOSIS — G4733 Obstructive sleep apnea (adult) (pediatric): Secondary | ICD-10-CM | POA: Diagnosis not present

## 2022-10-23 DIAGNOSIS — R809 Proteinuria, unspecified: Secondary | ICD-10-CM | POA: Diagnosis not present

## 2022-10-23 DIAGNOSIS — I130 Hypertensive heart and chronic kidney disease with heart failure and stage 1 through stage 4 chronic kidney disease, or unspecified chronic kidney disease: Secondary | ICD-10-CM | POA: Insufficient documentation

## 2022-10-23 DIAGNOSIS — E1122 Type 2 diabetes mellitus with diabetic chronic kidney disease: Secondary | ICD-10-CM | POA: Diagnosis not present

## 2022-10-23 DIAGNOSIS — H353211 Exudative age-related macular degeneration, right eye, with active choroidal neovascularization: Secondary | ICD-10-CM | POA: Diagnosis not present

## 2022-10-23 DIAGNOSIS — I5022 Chronic systolic (congestive) heart failure: Secondary | ICD-10-CM | POA: Insufficient documentation

## 2022-10-23 DIAGNOSIS — D509 Iron deficiency anemia, unspecified: Secondary | ICD-10-CM

## 2022-10-23 DIAGNOSIS — I129 Hypertensive chronic kidney disease with stage 1 through stage 4 chronic kidney disease, or unspecified chronic kidney disease: Secondary | ICD-10-CM | POA: Diagnosis not present

## 2022-10-23 LAB — CBC WITH DIFFERENTIAL/PLATELET
Abs Immature Granulocytes: 0.05 10*3/uL (ref 0.00–0.07)
Basophils Absolute: 0 10*3/uL (ref 0.0–0.1)
Basophils Relative: 0 %
Eosinophils Absolute: 0.1 10*3/uL (ref 0.0–0.5)
Eosinophils Relative: 1 %
HCT: 31 % — ABNORMAL LOW (ref 39.0–52.0)
Hemoglobin: 10.3 g/dL — ABNORMAL LOW (ref 13.0–17.0)
Immature Granulocytes: 1 %
Lymphocytes Relative: 27 %
Lymphs Abs: 1.9 10*3/uL (ref 0.7–4.0)
MCH: 32.5 pg (ref 26.0–34.0)
MCHC: 33.2 g/dL (ref 30.0–36.0)
MCV: 97.8 fL (ref 80.0–100.0)
Monocytes Absolute: 0.6 10*3/uL (ref 0.1–1.0)
Monocytes Relative: 9 %
Neutro Abs: 4.3 10*3/uL (ref 1.7–7.7)
Neutrophils Relative %: 62 %
Platelets: 143 10*3/uL — ABNORMAL LOW (ref 150–400)
RBC: 3.17 MIL/uL — ABNORMAL LOW (ref 4.22–5.81)
RDW: 13.7 % (ref 11.5–15.5)
WBC: 6.8 10*3/uL (ref 4.0–10.5)
nRBC: 0 % (ref 0.0–0.2)

## 2022-10-23 LAB — IRON AND TIBC
Iron: 87 ug/dL (ref 45–182)
Saturation Ratios: 36 % (ref 17.9–39.5)
TIBC: 244 ug/dL — ABNORMAL LOW (ref 250–450)
UIBC: 157 ug/dL

## 2022-10-23 LAB — FERRITIN: Ferritin: 69 ng/mL (ref 24–336)

## 2022-10-25 ENCOUNTER — Telehealth: Payer: Medicare HMO | Admitting: Oncology

## 2022-10-30 ENCOUNTER — Encounter: Payer: Self-pay | Admitting: Oncology

## 2022-10-30 ENCOUNTER — Inpatient Hospital Stay (HOSPITAL_BASED_OUTPATIENT_CLINIC_OR_DEPARTMENT_OTHER): Payer: Medicare HMO | Admitting: Oncology

## 2022-10-30 DIAGNOSIS — D631 Anemia in chronic kidney disease: Secondary | ICD-10-CM

## 2022-10-30 DIAGNOSIS — D509 Iron deficiency anemia, unspecified: Secondary | ICD-10-CM | POA: Diagnosis not present

## 2022-10-30 DIAGNOSIS — N184 Chronic kidney disease, stage 4 (severe): Secondary | ICD-10-CM | POA: Diagnosis not present

## 2022-10-30 NOTE — Progress Notes (Signed)
I connected with [name] [name] on 10/30/22 at  3:15 PM EDT by video enabled telemedicine visit and verified that I am speaking with the correct person using two identifiers.   I discussed the limitations, risks, security and privacy concerns of performing an evaluation and management service by telemedicine and the availability of in-person appointments. I also discussed with the patient that there may be a patient responsible charge related to this service. The patient expressed understanding and agreed to proceed.  Other persons participating in the visit and their role in the encounter: [name]  Patient's location:  home Provider's location:  work  Stage manager Complaint: Routine follow-up of anemia  History of present illness:  Patient is a 77 year old male with a past medical history significant for systolic congestive heart failure, type 2 diabetes, obstructive sleep apnea on CPAP, CKD, BPH among other medical problems.  He has been referred to [place] for anemia.  Most recent CBC from 10/04/2021 showed white cell count of 6.8, H&H of 10.3/30.4 with a platelet count of 179.  His baseline hemoglobin runs around 12 and was around that value until March 2023.  Patient does have baseline chronic kidney disease with a creatinine that fluctuates between 2.4-3.1.  Patient follows up with Dr. Lourdes Sledge for his stage IV chronic kidney disease.     Results of blood work from 10/10/2021 were as follows: CBC showed white count of 5.5, H&H of 10.3/30.5 with a platelet count of 198.  CMP showed a serum creatinine of 3.38.  B12 folate normal.  Haptoglobin TSH normal.  Myeloma panel showed no M protein.  Ferritin levels normal at 213.  Patient has not required any EPO or IV iron so far  Interval history patient is feeling at his baseline stateOf health.  He has not had any recent hospitalizations.  He follows up with Dr. Lourdes Sledge for his CKD.   Review of Systems  Constitutional:  Positive for malaise/fatigue.  Negative for chills, fever and weight loss.  HENT:  Negative for congestion, ear discharge and nosebleeds.   Eyes:  Negative for blurred vision.  Respiratory:  Negative for cough, hemoptysis, sputum production, shortness of breath and wheezing.   Cardiovascular:  Negative for chest pain, palpitations, orthopnea and claudication.  Gastrointestinal:  Negative for abdominal pain, blood in stool, constipation, diarrhea, heartburn, melena, nausea and vomiting.  Genitourinary:  Negative for dysuria, flank pain, frequency, hematuria and urgency.  Musculoskeletal:  Negative for back pain, joint pain and myalgias.  Skin:  Negative for rash.  Neurological:  Negative for dizziness, tingling, focal weakness, seizures, weakness and headaches.  Endo/Heme/Allergies:  Does not bruise/bleed easily.  Psychiatric/Behavioral:  Negative for depression and suicidal ideas. The patient does not have insomnia.     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 post. helix. Excised 03/02/2010   BCC (basal cell carcinoma of skin) 11/24/2020   R neck below the  ear, EDC   Benign hypertensive kidney disease with chronic kidney disease 02/24/2019   CAD (coronary artery disease)    CAP (community acquired pneumonia) 07/26/2020   CKD (chronic kidney disease), stage IV (HCC)    Complete tear of right rotator cuff 11/15/2015   Complex tear of lateral meniscus of left knee as current injury  12/06/2020   Complex tear of medial meniscus of left knee as current injury 10/18/2020   COPD (chronic obstructive pulmonary disease) (HCC)    DM (diabetes mellitus), type 2 (HCC) 07/30/2020   no meds   DOE (dyspnea on exertion)    GERD (gastroesophageal reflux disease)    Gout    Grade I diastolic dysfunction    Heart murmur    HFrEF (heart failure with reduced ejection fraction) (HCC)    History of hiatal hernia    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  Performed by Dr. Zebedee Iba on 08/01/16:  29 mm commercially available Medtronic Core Valve transcatheter aortic valve procedure placed via transfemoral approach by Dr. Zebedee Iba and co-surgeon Dr. Winona Legato.   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 MOHS.   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/07/2021   Right zygoma, EDC 05/02/21   Supplemental oxygen dependent    3-4 L/[place]   Supplemental oxygen dependent    T2DM (type 2 diabetes mellitus) (HCC)    no meds    Past Surgical History:  Procedure Laterality Date   AORTIC VALVE REPLACEMENT N/A 08/01/2016   29 mm CoreValve Evolut; Location: [facility]; Surgeon: Clent Jacks, MD   CARDIAC CATHETERIZATION N/A 12/26/2012   2v CAD; retained pigtail in myocardium --> transferred to [facility]; Location: [facility]; Surgeon: Despina Hick, MD   CATARACT EXTRACTION W/PHACO Left 10/03/2022   Procedure: CATARACT EXTRACTION PHACO AND INTRAOCULAR LENS PLACEMENT (IOC) LEFT DIABETIC  8.22 00:55.5;  Surgeon: Galen Manila, MD;  Location: [facility];  Service: Ophthalmology;  Laterality: Left;  sleep apnea   CATARACT EXTRACTION W/PHACO Right 10/17/2022   Procedure: CATARACT EXTRACTION PHACO AND INTRAOCULAR LENS PLACEMENT (IOC) RIGHT DIABETIC;  Surgeon: Galen Manila, MD;  Location: [facility];  Service: Ophthalmology;  Laterality: Right;  4.79 0:37.4   COLONOSCOPY     KNEE ARTHROSCOPY WITH MEDIAL MENISECTOMY Left 12/02/2020   Procedure: KNEE ARTHROSCOPY WITH DEBRIDEMENT AND PARTIAL MEDIAL AND LATERAL MENISECTOMY;  Surgeon: Christena Flake, MD;  Location: [facility];  Service: Orthopedics;  Laterality: Left;   LITHOTRIPSY     PERCUTANEOUS REMOVAL INTRA-AORTIC BALLOON CATH N/A 12/26/2012   Procedure: PERCUTANEOUS REMOVAL INTRA-AORTIC BALLOON CATH; Surgeon: Heloise Ochoa, MD; Location: [facility]; Service: Cardiothoracic   RIGHT HEART CATH Right 07/13/2016   Location: [facility]; Surgeon: Emilio Math, MD   TRANSESOPHAGEAL ECHOCARDIOGRAM N/A 12/26/2012   Procedure: TRANSESOPHAGEAL ECHOCARDIOGRAPHY; Surgeon: Heloise Ochoa, MD; Location: [facility]; Service: Cardiothoracic   UMBILICAL HERNIA REPAIR N/A 02/13/2014   Procedure: LAPAROSCOPIC UMBILICAL HERNIA REPAIR;  Surgeon: Merlinda Frederick, MD; Location[facility]; Service: General Surgery    Social History   Socioeconomic History   Marital status: Married    Spouse name: Yacine Westenhaver   Number of children: 2   Years of education: Not on file   Highest education level: Not on file  Occupational History   Occupation: retired  Tobacco Use   Smoking status: Former    Packs/day: 2.00    Years: 54.00    Additional pack years: 0.00    Total pack years: 108.00    Types: Cigarettes    Quit date: 2010    Years since quitting: 14.5   Smokeless tobacco: Former    Types: Chew    Quit date: 05/16/2004  Vaping Use   Vaping Use: Never used  Substance and Sexual Activity    Alcohol use: No   Drug use: Never   Sexual activity: Not on file  Other Topics Concern   Not on file  Social History Narrative   Not on file   Social Determinants of Health   Financial Resource Strain: Not on file  Food Insecurity: No Food Insecurity (02/14/2022)   Hunger Vital Sign    Worried About Running Out of Food in the Last Year: Never true    Ran Out of Food in the Last Year: Never true  Transportation Needs: No Transportation Needs (02/14/2022)   PRAPARE - Administrator, Civil Service (Medical): No    Lack of Transportation (Non-Medical): No  Physical Activity: Not on file  Stress: Not on file  Social Connections: Not on file  Intimate Partner Violence: Not on file    Family History  Adopted: Yes  Problem Relation Age of Onset   Varicose Veins Daughter    Obesity Daughter    Hyperlipidemia Daughter    Diabetes Daughter    COPD Daughter    Depression Daughter    Asthma Daughter    Arthritis Son    Diabetes Son    Hypertension Son      Current Outpatient Medications:    acetaminophen (TYLENOL) 650 MG CR tablet, Take 1,300 mg by mouth every 8 (eight) hours as needed for pain., Disp: , Rfl:    albuterol (VENTOLIN HFA) 108 (90 Base) MCG/ACT inhaler, Inhale 2 puffs into the lungs every 6 (six) hours as needed for wheezing or shortness of breath., Disp: , Rfl:    Alcohol Swabs (DROPSAFE ALCOHOL PREP) 70 % PADS, Apply 1 each topically as needed (With repatha and blood sugar checks.)., Disp: , Rfl:    Ascorbic Acid (VITAMIN C) 1000 MG tablet, Take 1,000 mg by mouth at bedtime., Disp: , Rfl:    aspirin EC 81 MG EC tablet, Take 1 tablet (81 mg total) by mouth daily., Disp: , Rfl:    busPIRone (BUSPAR) 5 MG tablet, TAKE 1 TABLET BY MOUTH TWICE DAILY AS NEEDED, Disp: 60 tablet, Rfl: 3   calcitRIOL (ROCALTROL) 0.25 MCG capsule, Take 0.25 mcg by mouth daily., Disp: , Rfl:    citalopram (CELEXA) 40 MG tablet, TAKE 1 TABLET EVERY DAY, Disp: 90 tablet, Rfl: 3    cyclobenzaprine (FLEXERIL) 5 MG tablet, Take 5 mg by mouth at bedtime as needed for muscle spasms (or cramping in legs.  May half tablet if needed)., Disp: , Rfl:    ENTRESTO 24-26 MG, Take 1 tablet by mouth 2 (two) times daily., Disp: , Rfl:    Evolocumab (REPATHA SURECLICK) 140 MG/ML SOAJ, INJECT CONTENT OF 1  CARTRIDGE UNDER THE SKIN EVERY OTHER WEEK, Disp: 6 mL, Rfl: 1   ezetimibe (ZETIA) 10 MG tablet, TAKE 1 TABLET(10 MG) BY MOUTH AT BEDTIME, Disp: 90 tablet, Rfl: 3   febuxostat (ULORIC) 40 MG tablet, Take 40 mg by mouth daily., Disp: , Rfl:    fexofenadine (ALLEGRA) 180 MG tablet, Take 180 mg by mouth daily as needed for allergies or rhinitis., Disp: , Rfl:    fluticasone (FLONASE) 50 MCG/ACT nasal spray, Place 2 sprays into both nostrils daily., Disp: , Rfl: 2   Fluticasone-Umeclidin-Vilant (TRELEGY ELLIPTA) 100-62.5-25 MCG/ACT AEPB, Inhale 1 Inhalation into the lungs daily at 6 (six) AM., Disp: 60 each, Rfl: 11   gabapentin (NEURONTIN) 400 MG capsule, TAKE 1 CAPSULE AT BEDTIME, Disp: 90 capsule, Rfl: 3   guaiFENesin-codeine (GUAIFENESIN AC) 100-10 MG/5ML syrup, Take 5 mLs by mouth 3 (three) times daily as needed for cough., Disp: 120 mL, Rfl: 0   ipratropium-albuterol (DUONEB) 0.5-2.5 (3) MG/3ML SOLN, Take 3 mLs by nebulization every 4 (four) hours as needed., Disp: 360 mL, Rfl: 3   losartan (COZAAR) 25 MG tablet, Take 1 tablet (25 mg total) by mouth daily., Disp: 30 tablet, Rfl: 11   metoprolol succinate (TOPROL-XL) 50 MG 24 hr tablet, Take 50 mg by mouth every morning. Take with or immediately following a meal., Disp: , Rfl:    Multiple Vitamins-Minerals (PRESERVISION AREDS) CAPS, Take by mouth., Disp: , Rfl:    OXYGEN, Inhale 3 L into the lungs See admin instructions. Used as needed throughout the day and continuous at bedtime, Disp: , Rfl:    pantoprazole (PROTONIX) 40 MG tablet, Take 40 mg by mouth every morning., Disp: , Rfl:    Simethicone (GAS-X PO), Take 1 tablet by mouth 2 (two)  times daily as needed (flatulence)., Disp: , Rfl:    sodium chloride (OCEAN) 0.65 % SOLN nasal spray, Place 1 spray into both nostrils as needed for congestion., Disp: , Rfl: 0   SUPER B COMPLEX/C PO, Take 1 tablet by mouth daily., Disp: , Rfl:    tamsulosin (FLOMAX) 0.4 MG CAPS capsule, TAKE 1 CAPSULE EVERY DAY, Disp: 90 capsule, Rfl: 3   torsemide (DEMADEX) 10 MG tablet, Take 1 tablet (10 mg total) by mouth daily., Disp: 30 tablet, Rfl: 11  No results found.  No images are attached to the encounter.      Latest Ref Rng & Units 09/06/2022    8:30 PM  CMP  Glucose 70 - 99 mg/dL 161   BUN 8 - 23 mg/dL 76   Creatinine 0.96 - 1.24 mg/dL 0.45   Sodium 409 - 811 mmol/L 139   Potassium 3.5 - 5.1 mmol/L 4.3   Chloride 98 - 111 mmol/L 107   CO2 22 - 32 mmol/L 22   Calcium 8.9 - 10.3 mg/dL 8.5       Latest Ref Rng & Units 10/23/2022   12:53 PM  CBC  WBC 4.0 - 10.5 K/uL 6.8   Hemoglobin 13.0 - 17.0 g/dL 91.4   Hematocrit 78.2 - 52.0 % 31.0   Platelets 150 - 400 K/uL 143      Observation/objective: Appears in no acute distress over video visit today.  Breathing is nonlabored  Assessment and plan: Patient is a 77 year old male with normocytic anemia likely a combination of chronic disease and chronic kidney disease  Patient's hemoglobin has remained stable between 10 And 11 over the last 8 to 9 months.  His ferritin levels are 69 today with an iron  saturation of 36%.  Although it might be desirable to keep ferritin close to 100 and patients with chronic kidney disease given that his iron studies look good I am holding off on any IV iron at this time.  He also does not require any EPO unless his hemoglobin remains less than 9 despite IV iron.  I will repeat CBC ferritin and iron studies in 3 and 6 months and see him back in 6 months  Follow-up instructions: As above  I discussed the assessment and treatment plan with the patient. The patient was provided an opportunity to ask questions and  all were answered. The patient agreed with the plan and demonstrated an understanding of the instructions.   The patient was advised to call back or seek an in-person evaluation if the symptoms worsen or if the condition fails to improve as anticipated.  I provided 12 minutes of face-to-face video visit time during this encounter, and > 50% was spent counseling as documented under my assessment & plan.  Visit Diagnosis: 1. Anemia of chronic kidney failure, stage 4 (severe) (HCC)   2. Iron deficiency anemia, unspecified iron deficiency anemia type     Dr. Owens Shark, MD, MPH [facility] at [facility] Tel- [phone] 10/30/2022 4:36 PM

## 2022-10-31 DIAGNOSIS — R809 Proteinuria, unspecified: Secondary | ICD-10-CM | POA: Diagnosis not present

## 2022-10-31 DIAGNOSIS — D631 Anemia in chronic kidney disease: Secondary | ICD-10-CM | POA: Diagnosis not present

## 2022-10-31 DIAGNOSIS — N2581 Secondary hyperparathyroidism of renal origin: Secondary | ICD-10-CM | POA: Diagnosis not present

## 2022-10-31 DIAGNOSIS — E1122 Type 2 diabetes mellitus with diabetic chronic kidney disease: Secondary | ICD-10-CM | POA: Diagnosis not present

## 2022-10-31 DIAGNOSIS — I129 Hypertensive chronic kidney disease with stage 1 through stage 4 chronic kidney disease, or unspecified chronic kidney disease: Secondary | ICD-10-CM | POA: Diagnosis not present

## 2022-10-31 DIAGNOSIS — N184 Chronic kidney disease, stage 4 (severe): Secondary | ICD-10-CM | POA: Diagnosis not present

## 2022-10-31 DIAGNOSIS — N050 Unspecified nephritic syndrome with minor glomerular abnormality: Secondary | ICD-10-CM | POA: Diagnosis not present

## 2022-11-06 DIAGNOSIS — Z0100 Encounter for examination of eyes and vision without abnormal findings: Secondary | ICD-10-CM | POA: Diagnosis not present

## 2022-11-07 DIAGNOSIS — H353211 Exudative age-related macular degeneration, right eye, with active choroidal neovascularization: Secondary | ICD-10-CM | POA: Diagnosis not present

## 2022-11-07 DIAGNOSIS — Z961 Presence of intraocular lens: Secondary | ICD-10-CM | POA: Diagnosis not present

## 2022-11-15 DIAGNOSIS — R143 Flatulence: Secondary | ICD-10-CM | POA: Diagnosis not present

## 2022-11-15 DIAGNOSIS — Z1211 Encounter for screening for malignant neoplasm of colon: Secondary | ICD-10-CM | POA: Diagnosis not present

## 2022-11-28 ENCOUNTER — Encounter: Payer: Self-pay | Admitting: Family

## 2022-11-28 ENCOUNTER — Ambulatory Visit (INDEPENDENT_AMBULATORY_CARE_PROVIDER_SITE_OTHER): Payer: Medicare HMO | Admitting: Family

## 2022-11-28 VITALS — BP 132/60 | HR 67 | Ht 63.0 in | Wt 199.0 lb

## 2022-11-28 DIAGNOSIS — I10 Essential (primary) hypertension: Secondary | ICD-10-CM

## 2022-11-28 DIAGNOSIS — N2581 Secondary hyperparathyroidism of renal origin: Secondary | ICD-10-CM | POA: Diagnosis not present

## 2022-11-28 DIAGNOSIS — J9611 Chronic respiratory failure with hypoxia: Secondary | ICD-10-CM

## 2022-11-28 DIAGNOSIS — I739 Peripheral vascular disease, unspecified: Secondary | ICD-10-CM

## 2022-11-28 DIAGNOSIS — E1122 Type 2 diabetes mellitus with diabetic chronic kidney disease: Secondary | ICD-10-CM

## 2022-11-28 DIAGNOSIS — E039 Hypothyroidism, unspecified: Secondary | ICD-10-CM

## 2022-11-28 DIAGNOSIS — E785 Hyperlipidemia, unspecified: Secondary | ICD-10-CM | POA: Diagnosis not present

## 2022-11-28 DIAGNOSIS — N184 Chronic kidney disease, stage 4 (severe): Secondary | ICD-10-CM

## 2022-11-28 LAB — POCT CBG (FASTING - GLUCOSE)-MANUAL ENTRY: Glucose Fasting, POC: 120 mg/dL — AB (ref 70–99)

## 2022-11-28 NOTE — Progress Notes (Signed)
Established Patient Office Visit  Subjective:  Patient ID: [name] [name], male    DOB: 03/28/1946  Age: 77 y.o. MRN: [id number]  Chief Complaint  Patient presents with   Follow-up    3 months follow up    Patient is here today for his 3 months follow up.  He has been feeling fairly well since last appointment.   He does not have additional concerns to discuss today.  Labs are due today. He needs refills.   I have reviewed his active problem list, medication list, allergies, notes from last encounter, lab results for his appointment today.   No other concerns at this time.   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 post. helix. 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   CAD (coronary artery disease)    CAP (community acquired pneumonia) 07/26/2020   CKD (chronic kidney disease), stage IV (HCC)    Complete tear of right rotator cuff 11/15/2015   Complex tear of lateral meniscus of left knee as current injury 12/06/2020   Complex tear of medial meniscus of left knee as current injury 10/18/2020   COPD (chronic obstructive pulmonary disease) (HCC)    DM (diabetes mellitus), type 2 (HCC) 07/30/2020   no meds   DOE (dyspnea on exertion)    GERD (gastroesophageal reflux disease)    Gout    Grade I diastolic dysfunction    Heart murmur    HFrEF (heart failure with reduced ejection fraction) (HCC)    History of hiatal hernia    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  Performed by Dr. Zebedee Iba on 08/01/16:  29 mm commercially available Medtronic Core Valve transcatheter aortic valve procedure placed via transfemoral approach by Dr. Zebedee Iba and co-surgeon Dr. Winona Legato.   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 MOHS.   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/07/2021   Right zygoma, EDC 05/02/21   Supplemental oxygen dependent    3-4 L/[place]   Supplemental oxygen dependent    T2DM (type 2 diabetes mellitus) (HCC)    no meds    Past Surgical History:  Procedure Laterality Date   AORTIC VALVE REPLACEMENT N/A 08/01/2016   29 mm CoreValve Evolut; Location: [facility]; Surgeon: Clent Jacks, MD   CARDIAC CATHETERIZATION N/A 12/26/2012   2v CAD; retained pigtail in myocardium --> transferred to [facility]; Location: [facility]; Surgeon: Despina Hick, MD   CATARACT  EXTRACTION W/PHACO Left 10/03/2022   Procedure: CATARACT EXTRACTION PHACO AND INTRAOCULAR LENS PLACEMENT (IOC) LEFT DIABETIC 8.22 00:55.5;  Surgeon: Galen Manila, MD;  Location: [facility];  Service: Ophthalmology;  Laterality: Left;  sleep apnea   CATARACT EXTRACTION W/PHACO Right 10/17/2022   Procedure: CATARACT EXTRACTION PHACO AND INTRAOCULAR LENS PLACEMENT (IOC) RIGHT DIABETIC;  Surgeon: Galen Manila, MD;  Location: [facility];  Service: Ophthalmology;  Laterality: Right;  4.79 0:37.4   COLONOSCOPY     KNEE ARTHROSCOPY WITH MEDIAL MENISECTOMY Left 12/02/2020   Procedure: KNEE  ARTHROSCOPY WITH DEBRIDEMENT AND PARTIAL MEDIAL AND LATERAL MENISECTOMY;  Surgeon: Christena Flake, MD;  Location: [facility];  Service: Orthopedics;  Laterality: Left;   LITHOTRIPSY     PERCUTANEOUS REMOVAL INTRA-AORTIC BALLOON CATH N/A 12/26/2012   Procedure: PERCUTANEOUS REMOVAL INTRA-AORTIC BALLOON CATH; Surgeon: Heloise Ochoa, MD; Location: [facility]; Service: Cardiothoracic   RIGHT HEART CATH Right 07/13/2016   Location: [facility]; Surgeon: Emilio Math, MD   TRANSESOPHAGEAL ECHOCARDIOGRAM N/A 12/26/2012   Procedure: TRANSESOPHAGEAL ECHOCARDIOGRAPHY; Surgeon: Heloise Ochoa, MD; Location: [facility]; Service: Cardiothoracic   UMBILICAL HERNIA REPAIR N/A 02/13/2014   Procedure: LAPAROSCOPIC UMBILICAL HERNIA REPAIR; Surgeon: Merlinda Frederick, MD; Location: [facility]; Service: General Surgery    Social History   Socioeconomic History   Marital status: Married    Spouse name: Jayven Boersma   Number of children: 2   Years of education: Not on file   Highest education level: Not on file  Occupational History   Occupation: retired  Tobacco Use   Smoking status: Former    Current packs/day: 0.00    Average packs/day: 2.0 packs/day for 54.0 years (108.0 ttl pk-yrs)    Types: Cigarettes    Start date: 80    Quit date: 2010    Years since quitting: 14.6   Smokeless tobacco: Former    Types: Chew    Quit date: 05/16/2004  Vaping Use   Vaping status: Never Used  Substance and Sexual Activity   Alcohol use: No   Drug use: Never   Sexual activity: Not on file  Other Topics Concern   Not on file  Social History Narrative   Not on file   Social Determinants of Health   Financial Resource Strain: Not on file  Food Insecurity: No Food Insecurity (02/14/2022)   Hunger Vital Sign    Worried About Running Out of Food in the Last Year: Never true    Ran Out of Food in the Last Year: Never true  Transportation Needs: No Transportation Needs  (02/14/2022)   PRAPARE - Administrator, Civil Service (Medical): No    Lack of Transportation (Non-Medical): No  Physical Activity: Not on file  Stress: Not on file  Social Connections: Not on file  Intimate Partner Violence: Not on file    Family History  Adopted: Yes  Problem Relation Age of Onset   Varicose Veins Daughter    Obesity Daughter    Hyperlipidemia Daughter    Diabetes Daughter    COPD Daughter    Depression Daughter    Asthma Daughter    Arthritis Son    Diabetes Son    Hypertension Son     Allergies  Allergen Reactions   Nsaids Other (See Comments)    Avoid due to kidney disease    Statins Other (See Comments)    Myalgias   Nexletol [Bempedoic Acid] Diarrhea    fatigue  Review of Systems  Constitutional:  Positive for malaise/fatigue.  Respiratory:  Positive for shortness of breath.   All other systems reviewed and are negative.      Objective:   BP 132/60   Pulse 67   Ht 5\' 3"  (1.6 m)   Wt 199 lb (90.3 kg)   SpO2 96%   BMI 35.25 kg/m   Vitals:   11/28/22 1005  BP: 132/60  Pulse: 67  Height: 5\' 3"  (1.6 m)  Weight: 199 lb (90.3 kg)  SpO2: 96%  BMI (Calculated): 35.26    Physical Exam Vitals and nursing note reviewed.  Constitutional:      Appearance: Normal appearance. He is normal weight.  Eyes:     Extraocular Movements: Extraocular movements intact.     Conjunctiva/sclera: Conjunctivae normal.     Pupils: Pupils are equal, round, and reactive to light.  Cardiovascular:     Rate and Rhythm: Normal rate and regular rhythm.     Pulses: Normal pulses.  Pulmonary:     Effort: Pulmonary effort is normal.  Neurological:     General: No focal deficit present.     Mental Status: He is alert and oriented to person, place, and time.  Psychiatric:        Mood and Affect: Mood normal.        Behavior: Behavior normal.        Thought Content: Thought content normal.        Judgment: Judgment normal.      Results  for orders placed or performed in visit on 11/28/22  POCT CBG (Fasting - Glucose)  Result Value Ref Range   Glucose Fasting, POC 120 (A) 70 - 99 mg/dL    Recent Results (from the past 2160 hour(s))  Basic metabolic panel     Status: Abnormal   Collection Time: 09/06/22  8:30 PM  Result Value Ref Range   Sodium 139 135 - 145 mmol/L   Potassium 4.3 3.5 - 5.1 mmol/L   Chloride 107 98 - 111 mmol/L   CO2 22 22 - 32 mmol/L   Glucose, Bld 122 (H) 70 - 99 mg/dL    Comment: Glucose reference range applies only to samples taken after fasting for at least 8 hours.   BUN 76 (H) 8 - 23 mg/dL   Creatinine, Ser 1.61 (H) 0.61 - 1.24 mg/dL   Calcium 8.5 (L) 8.9 - 10.3 mg/dL   GFR, Estimated 19 (L) >60 mL/min    Comment: (NOTE) Calculated using the CKD-EPI Creatinine Equation (2021)    Anion gap 10 5 - 15    Comment: Performed at [facility], [address] [place], [place] [address]  CBC     Status: Abnormal   Collection Time: 09/06/22  8:30 PM  Result Value Ref Range   WBC 8.3 4.0 - 10.5 K/uL   RBC 3.19 (L) 4.22 - 5.81 MIL/uL   Hemoglobin 10.2 (L) 13.0 - 17.0 g/dL   HCT 54.0 (L) 98.1 - 19.1 %   MCV 98.4 80.0 - 100.0 fL   MCH 32.0 26.0 - 34.0 pg   MCHC 32.5 30.0 - 36.0 g/dL   RDW 47.8 29.5 - 62.1 %   Platelets 218 150 - 400 K/uL   nRBC 0.0 0.0 - 0.2 %    Comment: Performed at [facility], [address] [place], [place] [address]  Troponin I (High Sensitivity)     Status: None   Collection Time: 09/06/22  8:30 PM  Result Value Ref Range   Troponin I (High Sensitivity) 11 <18 ng/L    Comment: (NOTE) Elevated high sensitivity troponin I (hsTnI) values and significant  changes across serial measurements may suggest ACS but many other  chronic and acute conditions are known to elevate hsTnI results.  Refer to the "Links" section for chest pain algorithms and additional  guidance. Performed at [facility], [address] [place], [place] [address]    Troponin I (High Sensitivity)     Status: None   Collection Time: 09/06/22 11:19 PM  Result Value Ref Range   Troponin I (High Sensitivity) 10 <18 ng/L    Comment: (NOTE) Elevated high sensitivity troponin I (hsTnI) values and significant  changes across serial measurements may suggest ACS but many other  chronic and acute conditions are known to elevate hsTnI results.  Refer to the "Links" section for chest pain algorithms and additional  guidance. Performed at [facility], [address] [place], [place] [address]   Glucose, capillary     Status: None   Collection Time: 10/03/22 11:08 AM  Result Value Ref Range   Glucose-Capillary 99 70 - 99 mg/dL    Comment: Glucose reference range applies only to samples taken after fasting for at least 8 hours.  POCT Glucose (CBG)     Status: Abnormal   Collection Time: 10/16/22 10:20 AM  Result Value Ref Range   POC Glucose 194 (A) 70 - 99 mg/dl  Iron and TIBC([facility])     Status: Abnormal   Collection Time: 10/23/22 12:53 PM  Result Value Ref Range   Iron 87 45 - 182 ug/dL   TIBC 132 (L) 440 - 102 ug/dL   Saturation Ratios 36 17.9 - 39.5 %   UIBC 157 ug/dL    Comment: Performed at [facility], [address] [place], [place] [address]  Ferritin     Status: None   Collection Time: 10/23/22 12:53 PM  Result Value Ref Range   Ferritin 69 24 - 336 ng/mL    Comment: Performed at [facility], [address] [place], [place] [address]  CBC with Differential     Status: Abnormal   Collection Time: 10/23/22 12:53 PM  Result Value Ref Range   WBC 6.8 4.0 - 10.5 K/uL   RBC 3.17 (L) 4.22 - 5.81 MIL/uL   Hemoglobin 10.3 (L) 13.0 - 17.0 g/dL   HCT 47.4 (L) 25.9 - 56.3 %   MCV 97.8 80.0 - 100.0 fL   MCH 32.5 26.0 - 34.0 pg   MCHC 33.2 30.0 - 36.0 g/dL   RDW 87.5 64.3 - 32.9 %   Platelets 143 (L) 150 - 400 K/uL   nRBC 0.0 0.0 - 0.2 %   Neutrophils Relative % 62 %   Neutro Abs 4.3 1.7 - 7.7 K/uL    Lymphocytes Relative 27 %   Lymphs Abs 1.9 0.7 - 4.0 K/uL   Monocytes Relative 9 %   Monocytes Absolute 0.6 0.1 - 1.0 K/uL   Eosinophils Relative 1 %   Eosinophils Absolute 0.1 0.0 - 0.5 K/uL   Basophils Relative 0 %   Basophils Absolute 0.0 0.0 - 0.1 K/uL   Immature Granulocytes 1 %   Abs Immature Granulocytes 0.05 0.00 - 0.07 K/uL    Comment: Performed at [facility], [address] [place], [place] [address]  POCT CBG (Fasting - Glucose)     Status: Abnormal   Collection Time: 11/28/22 10:16 AM  Result Value Ref Range   Glucose Fasting, POC 120 (A) 70 - 99 mg/dL       Assessment & Plan:   Problem List Items Addressed This Visit       Active Problems   Chronic respiratory failure with hypoxia (HCC) - Primary    Patient stable.  Well controlled with current therapy.   Continue current meds.       Essential hypertension    Blood pressure well controlled with current medications.  Continue current therapy.  Will reassess at follow up.       Hyperlipidemia    Checking labs today.  Continue current therapy for lipid control. Will modify as needed based on labwork results.        Secondary hyperparathyroidism of renal origin [facility])    Patient stable.  Well controlled with current therapy.   Continue current meds.        Type 2 diabetes mellitus with diabetic chronic kidney disease (HCC)    Checking labs today. Will call pt. With results  Continue current diabetes POC, as patient has been well controlled on current regimen.  Will adjust meds if needed based on labs.        Relevant Orders   POCT CBG (Fasting - Glucose) (Completed)   Claudication of both lower extremities (HCC)    Patient stable.  Well controlled with current therapy.   Continue current meds.        Other Visit Diagnoses     Hypothyroidism, unspecified type           Return in about 3 months (around 02/28/2023).   Total time spent: 30 minutes  Miki Kins,  FNP  11/28/2022   This document may have been prepared by [facility] Voice Recognition software and as such may include unintentional dictation errors.

## 2022-11-28 NOTE — Assessment & Plan Note (Signed)
Patient stable.  Well controlled with current therapy.   Continue current meds.  

## 2022-11-28 NOTE — Assessment & Plan Note (Signed)
Checking labs today.  Continue current therapy for lipid control. Will modify as needed based on labwork results.  

## 2022-11-28 NOTE — Assessment & Plan Note (Signed)
Checking labs today. Will call pt. With results  Continue current diabetes POC, as patient has been well controlled on current regimen.  Will adjust meds if needed based on labs.  

## 2022-11-28 NOTE — Assessment & Plan Note (Signed)
>>  ASSESSMENT AND PLAN FOR TYPE 2 DIABETES MELLITUS WITH DIABETIC CHRONIC KIDNEY DISEASE (HCC) WRITTEN ON 11/28/2022  8:18 PM BY Trenda Frisk, FNP  Checking labs today. Will call pt. With results  Continue current diabetes POC, as patient has been well controlled on current regimen.  Will adjust meds if needed based on labs.

## 2022-11-28 NOTE — Assessment & Plan Note (Signed)
Blood pressure well controlled with current medications.  Continue current therapy.  Will reassess at follow up.  

## 2022-12-04 DIAGNOSIS — H353211 Exudative age-related macular degeneration, right eye, with active choroidal neovascularization: Secondary | ICD-10-CM | POA: Diagnosis not present

## 2022-12-05 ENCOUNTER — Telehealth: Payer: Self-pay | Admitting: Dermatology

## 2022-12-05 ENCOUNTER — Ambulatory Visit: Payer: Medicare HMO | Admitting: Dermatology

## 2022-12-05 ENCOUNTER — Encounter: Payer: Self-pay | Admitting: Dermatology

## 2022-12-05 DIAGNOSIS — R21 Rash and other nonspecific skin eruption: Secondary | ICD-10-CM | POA: Diagnosis not present

## 2022-12-05 DIAGNOSIS — C44329 Squamous cell carcinoma of skin of other parts of face: Secondary | ICD-10-CM | POA: Diagnosis not present

## 2022-12-05 DIAGNOSIS — L578 Other skin changes due to chronic exposure to nonionizing radiation: Secondary | ICD-10-CM

## 2022-12-05 DIAGNOSIS — L814 Other melanin hyperpigmentation: Secondary | ICD-10-CM | POA: Diagnosis not present

## 2022-12-05 DIAGNOSIS — W908XXA Exposure to other nonionizing radiation, initial encounter: Secondary | ICD-10-CM | POA: Diagnosis not present

## 2022-12-05 DIAGNOSIS — D0439 Carcinoma in situ of skin of other parts of face: Secondary | ICD-10-CM | POA: Diagnosis not present

## 2022-12-05 DIAGNOSIS — L570 Actinic keratosis: Secondary | ICD-10-CM | POA: Diagnosis not present

## 2022-12-05 DIAGNOSIS — Z85828 Personal history of other malignant neoplasm of skin: Secondary | ICD-10-CM

## 2022-12-05 DIAGNOSIS — D099 Carcinoma in situ, unspecified: Secondary | ICD-10-CM

## 2022-12-05 DIAGNOSIS — L820 Inflamed seborrheic keratosis: Secondary | ICD-10-CM

## 2022-12-05 DIAGNOSIS — D485 Neoplasm of uncertain behavior of skin: Secondary | ICD-10-CM

## 2022-12-05 DIAGNOSIS — L821 Other seborrheic keratosis: Secondary | ICD-10-CM | POA: Diagnosis not present

## 2022-12-05 DIAGNOSIS — Z1283 Encounter for screening for malignant neoplasm of skin: Secondary | ICD-10-CM

## 2022-12-05 DIAGNOSIS — D1801 Hemangioma of skin and subcutaneous tissue: Secondary | ICD-10-CM

## 2022-12-05 DIAGNOSIS — D229 Melanocytic nevi, unspecified: Secondary | ICD-10-CM

## 2022-12-05 DIAGNOSIS — Z872 Personal history of diseases of the skin and subcutaneous tissue: Secondary | ICD-10-CM

## 2022-12-05 DIAGNOSIS — C4492 Squamous cell carcinoma of skin, unspecified: Secondary | ICD-10-CM

## 2022-12-05 DIAGNOSIS — D692 Other nonthrombocytopenic purpura: Secondary | ICD-10-CM

## 2022-12-05 HISTORY — DX: Carcinoma in situ, unspecified: D09.9

## 2022-12-05 HISTORY — DX: Squamous cell carcinoma of skin, unspecified: C44.92

## 2022-12-05 MED ORDER — HYDROCORTISONE 2.5 % EX CREA
TOPICAL_CREAM | Freq: Two times a day (BID) | CUTANEOUS | 1 refills | Status: DC | PRN
Start: 2022-12-05 — End: 2023-02-06

## 2022-12-05 MED ORDER — CLINDAMYCIN PHOSPHATE 1 % EX LOTN
TOPICAL_LOTION | Freq: Every day | CUTANEOUS | 1 refills | Status: DC
Start: 2022-12-05 — End: 2023-02-06

## 2022-12-05 NOTE — Progress Notes (Unsigned)
Follow-Up Visit   Subjective  [name] [name] is a 77 y.o. male who presents for the following: Skin Cancer Screening and Upper Body Skin Exam  The patient presents for Upper Body Skin Exam (UBSE) for skin cancer screening and mole check. The patient has spots, moles and lesions to be evaluated, some may be new or changing and the patient may have concern these could be cancer.  Patient with of SCC, BCC and AK's. Patient uses T Sal shampoo and ketoconazole 2% cream for seb derm at eyebrows and scalp. No itching at scalp.   Rash at inframammary, using Nystatin.   The following portions of the chart were reviewed this encounter and updated as appropriate: medications, allergies, medical history  Review of Systems:  No other skin or systemic complaints except as noted in HPI or Assessment and Plan.  Objective  Well appearing patient in no apparent distress; mood and affect are within normal limits.  All skin waist up examined. Relevant physical exam findings are noted in the Assessment and Plan.  Right Zygoma 12 mm erythematous scaly plaque adjacent to EDC scar [phone] R/o recurrent SCC     left superior forehead 8 mm pigmented scaly papule R/o SCC     right helical rim x 3, R post helix x 1, R parietal scalp x 3, L ear x 4, L cheek x 1, L parietal scalp x 1 (13) Erythematous thin papules/macules with gritty scale.   Right Shoulder - Anterior Erythematous stuck-on, waxy papule or plaque  right axilla, right inframmary Macerated patches    Assessment & Plan   Neoplasm of uncertain behavior of skin (2) Right Zygoma  Skin / nail biopsy Type of biopsy: tangential   Informed consent: discussed and consent obtained   Timeout: patient name, date of birth, surgical site, and procedure verified   Procedure prep:  Patient was prepped and draped in usual sterile fashion Prep type:  Isopropyl alcohol Anesthesia: the lesion was anesthetized in a standard fashion    Anesthetic:  1% lidocaine w/ epinephrine 1-100,000 buffered w/ 8.4% NaHCO3 Instrument used: DermaBlade   Hemostasis achieved with: pressure, aluminum chloride and electrodesiccation   Outcome: patient tolerated procedure well   Post-procedure details: sterile dressing applied and wound care instructions given   Dressing type: bandage and petrolatum    Specimen 1 - Surgical pathology Differential Diagnosis: R/o recurrent SCC  Check Margins: yes 12 mm erythematous scaly plaque adjacent to EDC scar [id number]   left superior forehead  Skin / nail biopsy Type of biopsy: tangential   Informed consent: discussed and consent obtained   Timeout: patient name, date of birth, surgical site, and procedure verified   Procedure prep:  Patient was prepped and draped in usual sterile fashion Prep type:  Isopropyl alcohol Anesthesia: the lesion was anesthetized in a standard fashion   Anesthetic:  1% lidocaine w/ epinephrine 1-100,000 buffered w/ 8.4% NaHCO3 Instrument used: DermaBlade   Hemostasis achieved with: pressure, aluminum chloride and electrodesiccation   Outcome: patient tolerated procedure well   Post-procedure details: sterile dressing applied and wound care instructions given   Dressing type: bandage and petrolatum    Specimen 2 - Surgical pathology Differential Diagnosis: R/o SCC  Check Margins: yes 8 mm pigmented scaly papule   AK (actinic keratosis) (13) right helical rim x 3, R post helix x 1, R parietal scalp x 3, L ear x 4, L cheek x 1, L parietal scalp x 1  Actinic keratoses are precancerous spots that appear  secondary to cumulative UV radiation exposure/sun exposure over time. They are chronic with expected duration over 1 year. A portion of actinic keratoses will progress to squamous cell carcinoma of the skin. It is not possible to reliably predict which spots will progress to skin cancer and so treatment is recommended to prevent development of skin  cancer.  Recommend daily broad spectrum sunscreen SPF 30+ to sun-exposed areas, reapply every 2 hours as needed.  Recommend staying in the shade or wearing long sleeves, sun glasses (UVA+UVB protection) and wide brim hats (4-inch brim around the entire circumference of the hat). Call for new or changing lesions.   Destruction of lesion - right helical rim x 3, R post helix x 1, R parietal scalp x 3, L ear x 4, L cheek x 1, L parietal scalp x 1 (13)  Destruction method: cryotherapy   Informed consent: discussed and consent obtained   Lesion destroyed using liquid nitrogen: Yes   Cryotherapy cycles:  2 Outcome: patient tolerated procedure well with no complications   Post-procedure details: wound care instructions given    Inflamed seborrheic keratosis Right Shoulder - Anterior  Symptomatic, irritating, patient would like treated.  Benign-appearing.  Call clinic for new or changing lesions.    Destruction of lesion - Right Shoulder - Anterior  Destruction method: cryotherapy   Informed consent: discussed and consent obtained   Lesion destroyed using liquid nitrogen: Yes   Cryotherapy cycles:  2 Outcome: patient tolerated procedure well with no complications   Post-procedure details: wound care instructions given    Rash and other nonspecific skin eruption right axilla, right inframmary  Start HC 2.5% cream mixed with clindamycin and apply twice daily to rash until resolved.     Related Medications hydrocortisone 2.5 % cream Apply topically 2 (two) times daily as needed (Rash). Mix with clindamycin lotion and apply to right armpit rash twice per day until rash resolves, then stop  clindamycin (CLEOCIN-T) 1 % lotion Apply topically daily. Mix with hydrocortisone 2.5% cream and apply to rash on right armpit twice per day until rash resolves, then stop   Skin cancer screening performed today.  Actinic Damage - Chronic condition, secondary to cumulative UV/sun exposure -  diffuse scaly erythematous macules with underlying dyspigmentation - Recommend daily broad spectrum sunscreen SPF 30+ to sun-exposed areas, reapply every 2 hours as needed.  - Staying in the shade or wearing long sleeves, sun glasses (UVA+UVB protection) and wide brim hats (4-inch brim around the entire circumference of the hat) are also recommended for sun protection.  - Call for new or changing lesions.  Lentigines, Seborrheic Keratoses, Hemangiomas - Benign normal skin lesions - Benign-appearing - Call for any changes  Melanocytic Nevi - Tan-brown and/or pink-flesh-colored symmetric macules and papules - Benign appearing on exam today - Observation - Call clinic for new or changing moles - Recommend daily use of broad spectrum spf 30+ sunscreen to sun-exposed areas.   HISTORY OF BASAL CELL CARCINOMA OF THE SKIN - No evidence of recurrence today - Recommend regular full body skin exams - Recommend daily broad spectrum sunscreen SPF 30+ to sun-exposed areas, reapply every 2 hours as needed.  - Call if any new or changing lesions are noted between office visits  HISTORY OF SQUAMOUS CELL CARCINOMA OF THE SKIN - No evidence of recurrence today - No lymphadenopathy - Recommend regular full body skin exams - Recommend daily broad spectrum sunscreen SPF 30+ to sun-exposed areas, reapply every 2 hours as needed.  -  Call if any new or changing lesions are noted between office visits  Purpura - Chronic; persistent and recurrent.  Treatable, but not curable. - Violaceous macules and patches - Benign - Related to trauma, age, sun damage and/or use of blood thinners, chronic use of topical and/or oral steroids - Observe - Can use OTC arnica containing moisturizer such as Dermend Bruise Formula if desired - Call for worsening or other concerns     Return in about 3 months (around 03/07/2023) for UBSE.  Anise Salvo, RMA, am acting as scribe for Elie Goody, MD  .   Documentation: I have reviewed the above documentation for accuracy and completeness, and I agree with the above.  Elie Goody, MD

## 2022-12-05 NOTE — Patient Instructions (Signed)
Start HC 2.5% cream mixed with clindamycin and apply twice daily to rash until resolved.   Wound Care Instructions  Cleanse wound gently with soap and water once a day then pat dry with clean gauze. Apply a thin coat of Petrolatum (petroleum jelly, "Vaseline") over the wound (unless you have an allergy to this). We recommend that you use a new, sterile tube of Vaseline. Do not pick or remove scabs. Do not remove the yellow or white "healing tissue" from the base of the wound.  Cover the wound with fresh, clean, nonstick gauze and secure with paper tape. You may use Band-Aids in place of gauze and tape if the wound is small enough, but would recommend trimming much of the tape off as there is often too much. Sometimes Band-Aids can irritate the skin.  You should call the office for your biopsy report after 1 week if you have not already been contacted.  If you experience any problems, such as abnormal amounts of bleeding, swelling, significant bruising, significant pain, or evidence of infection, please call the office immediately.  FOR ADULT SURGERY PATIENTS: If you need something for pain relief you may take 1 extra strength Tylenol (acetaminophen) AND 2 Ibuprofen (200mg  each) together every 4 hours as needed for pain. (do not take these if you are allergic to them or if you have a reason you should not take them.) Typically, you may only need pain medication for 1 to 3 days.   Cryotherapy Aftercare  Wash gently with soap and water everyday.   Apply Vaseline and Band-Aid daily until healed.   Due to recent changes in healthcare laws, you may see results of your pathology and/or laboratory studies on MyChart before the doctors have had a chance to review them. We understand that in some cases there may be results that are confusing or concerning to you. Please understand that not all results are received at the same time and often the doctors may need to interpret multiple results in order to  provide you with the best plan of care or course of treatment. Therefore, we ask that you please give [place] 2 business days to thoroughly review all your results before contacting the office for clarification. Should we see a critical lab result, you will be contacted sooner.   If You Need Anything After Your Visit  If you have any questions or concerns for your doctor, please call our main line at [phone] and press option 4 to reach your doctor's medical assistant. If no one answers, please leave a voicemail as directed and we will return your call as soon as possible. Messages left after 4 pm will be answered the following business day.   You may also send [place] a message via MyChart. We typically respond to MyChart messages within 1-2 business days.  For prescription refills, please ask your pharmacy to contact our office. Our fax number is [phone].  If you have an urgent issue when the clinic is closed that cannot wait until the next business day, you can page your doctor at the number below.    Please note that while we do our best to be available for urgent issues outside of office hours, we are not available 24/7.   If you have an urgent issue and are unable to reach [place], you may choose to seek medical care at your doctor's office, retail clinic, urgent care center, or emergency room.  If you have a medical emergency, please immediately call 911 or  go to the emergency department.  Pager Numbers  - Dr. Gwen Pounds: [phone]  - Dr. Roseanne Reno: [phone]  - Dr. Katrinka Blazing: [phone]   In the event of inclement weather, please call our main line at [phone] for an update on the status of any delays or closures.  Dermatology Medication Tips: Please keep the boxes that topical medications come in in order to help keep track of the instructions about where and how to use these. Pharmacies typically print the medication instructions only on the boxes and not directly on the  medication tubes.   If your medication is too expensive, please contact our office at [phone] option 4 or send [place] a message through MyChart.   We are unable to tell what your co-pay for medications will be in advance as this is different depending on your insurance coverage. However, we may be able to find a substitute medication at lower cost or fill out paperwork to get insurance to cover a needed medication.   If a prior authorization is required to get your medication covered by your insurance company, please allow [place] 1-2 business days to complete this process.  Drug prices often vary depending on where the prescription is filled and some pharmacies may offer cheaper prices.  The website www.goodrx.com contains coupons for medications through different pharmacies. The prices here do not account for what the cost may be with help from insurance (it may be cheaper with your insurance), but the website can give you the price if you did not use any insurance.  - You can print the associated coupon and take it with your prescription to the pharmacy.  - You may also stop by our office during regular business hours and pick up a GoodRx coupon card.  - If you need your prescription sent electronically to a different pharmacy, notify our office through [facility] or by phone at [phone] option 4.     Si Usted Necesita Algo Despus de Su Visita  Tambin puede enviarnos un mensaje a travs de Clinical cytogeneticist. Por lo general respondemos a los mensajes de MyChart en el transcurso de 1 a 2 das hbiles.  Para renovar recetas, por favor pida a su farmacia que se ponga en contacto con nuestra oficina. Annie Sable de fax es [place] [phone].  Si tiene un asunto urgente cuando la clnica est cerrada y que no puede esperar hasta el siguiente da hbil, puede llamar/localizar a su doctor(a) al nmero que aparece a continuacin.   Por favor, tenga en cuenta que aunque hacemos todo lo posible  para estar disponibles para asuntos urgentes fuera del horario de [place], no estamos disponibles las 24 horas del da, los 7 [address]  [address] de la [place].   Si tiene un problema urgente y no puede comunicarse con nosotros, puede optar por buscar atencin mdica  en el consultorio de su doctor(a), en una clnica privada, en un centro de atencin urgente o en una sala de emergencias.  Si tiene Engineer, drilling, por favor llame inmediatamente al 911 o vaya a la sala de emergencias.  Nmeros de bper  - Dr. Gwen Pounds: [phone]  - Dra. Roseanne Reno: [phone]  - Dr. Katrinka Blazing: [phone]   En caso de inclemencias del tiempo, por favor llame a Lacy Duverney principal al [phone] para una actualizacin sobre el [place] de cualquier retraso o cierre.  Consejos para la medicacin en dermatologa: Por favor, guarde las cajas en las que vienen los medicamentos de uso tpico para ayudarle a seguir las instrucciones sobre  dnde y cmo usarlos. Las farmacias generalmente imprimen las instrucciones del medicamento slo en las cajas y no directamente en los tubos del [place].   Si su medicamento es muy caro, por favor, pngase en contacto con Rolm Gala llamando al [phone] y presione la opcin 4 o envenos un mensaje a travs de Clinical cytogeneticist.   No podemos decirle cul ser su copago por los medicamentos por adelantado ya que esto es diferente dependiendo de la cobertura de su seguro. Sin embargo, es posible que podamos encontrar un medicamento sustituto a Audiological scientist un formulario para que el seguro cubra el medicamento que se considera necesario.   Si se requiere una autorizacin previa para que su compaa de seguros [place] su medicamento, por favor permtanos de 1 a 2 das hbiles para completar [address]  Los precios de los medicamentos varan con frecuencia dependiendo del Environmental consultant de dnde se surte la receta y alguna farmacias pueden ofrecer precios ms baratos.  El sitio web  www.goodrx.com tiene cupones para medicamentos de Health and safety inspector. Los precios aqu no tienen en cuenta lo que podra costar con la ayuda del seguro (puede ser ms barato con su seguro), pero el sitio web puede darle el precio si no utiliz Tourist information centre manager.  - Puede imprimir el cupn correspondiente y llevarlo con su receta a la farmacia.  - Tambin puede pasar por nuestra oficina durante el horario de atencin regular y Education officer, museum una tarjeta de cupones de GoodRx.  - Si necesita que su receta se enve electrnicamente a una farmacia diferente, informe a nuestra oficina a travs de MyChart de [place] o por telfono llamando al [phone] y presione la opcin 4.

## 2022-12-05 NOTE — Telephone Encounter (Signed)
I was paged with the phone number [phone] at 10:33 and 10:49 PM. Searching this number in patient station highlighted an individual with this patient's last name. I called the number and someone from the [facility]  ED answered. Their records show my pager number is listed as the infectious disease doctors' number. Told them that it was my pager number. Suspect that the individual with that number listed as a work number may not be related to patient.

## 2022-12-06 ENCOUNTER — Encounter: Payer: Self-pay | Admitting: Dermatology

## 2022-12-07 ENCOUNTER — Ambulatory Visit (INDEPENDENT_AMBULATORY_CARE_PROVIDER_SITE_OTHER): Payer: Medicare HMO | Admitting: Cardiovascular Disease

## 2022-12-07 ENCOUNTER — Encounter: Payer: Self-pay | Admitting: Cardiovascular Disease

## 2022-12-07 VITALS — BP 140/60 | HR 62 | Ht 63.0 in | Wt 201.6 lb

## 2022-12-07 DIAGNOSIS — E782 Mixed hyperlipidemia: Secondary | ICD-10-CM | POA: Diagnosis not present

## 2022-12-07 DIAGNOSIS — I5020 Unspecified systolic (congestive) heart failure: Secondary | ICD-10-CM

## 2022-12-07 DIAGNOSIS — I739 Peripheral vascular disease, unspecified: Secondary | ICD-10-CM | POA: Diagnosis not present

## 2022-12-07 DIAGNOSIS — I25118 Atherosclerotic heart disease of native coronary artery with other forms of angina pectoris: Secondary | ICD-10-CM

## 2022-12-07 NOTE — Progress Notes (Signed)
Cardiology Office Note   Date:  12/07/2022   ID:  [name] [name], [name] [name] 04/27/1945, MRN [id number]  PCP:  Miki Kins, FNP  Cardiologist:  Adrian Blackwater, MD      History of Present Illness: [name] [name] is a 77 y.o. male who presents for  Chief Complaint  Patient presents with   Follow-up    2 mo f/u    Needs shoulder replacement, still SOB, on O2.      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 post. helix. 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   CAD (coronary artery disease)    CAP (community acquired pneumonia) 07/26/2020   CKD (chronic kidney disease), stage IV (HCC)    Complete tear of right rotator cuff 11/15/2015   Complex tear of lateral meniscus of left knee as current injury 12/06/2020   Complex tear of medial meniscus of left knee as current injury 10/18/2020   COPD (chronic obstructive pulmonary disease) (HCC)    DM (diabetes mellitus), type 2 (HCC) 07/30/2020   no meds   DOE (dyspnea on exertion)    GERD (gastroesophageal reflux disease)    Gout    Grade I diastolic dysfunction    Heart murmur    HFrEF (heart failure with reduced ejection fraction) (HCC)    History of hiatal hernia    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  Performed by Dr. Zebedee Iba on 08/01/16:  29 mm commercially available Medtronic Core Valve transcatheter aortic valve procedure placed via transfemoral approach by Dr. Zebedee Iba and co-surgeon Dr. Winona Legato.   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 MOHS.   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/07/2021   Right zygoma, EDC 05/02/21   Supplemental oxygen dependent    3-4 L/[place]   Supplemental oxygen dependent    T2DM (type 2 diabetes mellitus) (HCC)    no meds     Past Surgical History:  Procedure Laterality Date   AORTIC VALVE REPLACEMENT N/A 08/01/2016   29 mm CoreValve Evolut; Location: [facility]; Surgeon: Clent Jacks, MD   CARDIAC CATHETERIZATION N/A 12/26/2012   2v CAD; retained pigtail in myocardium --> transferred to [facility]; Location: [facility]; Surgeon: Despina Hick, MD   CATARACT EXTRACTION W/PHACO Left 10/03/2022   Procedure: CATARACT EXTRACTION PHACO AND INTRAOCULAR LENS PLACEMENT (IOC) LEFT DIABETIC 8.22 00:55.5;  Surgeon: Galen Manila, MD;  Location: [facility];  Service: Ophthalmology;  Laterality: Left;  sleep apnea   CATARACT EXTRACTION W/PHACO Right 10/17/2022   Procedure: CATARACT EXTRACTION PHACO AND INTRAOCULAR LENS PLACEMENT (IOC) RIGHT DIABETIC;  Surgeon: Galen Manila, MD;  Location: [facility];  Service: Ophthalmology;  Laterality: Right;  4.79 0:37.4   COLONOSCOPY     KNEE ARTHROSCOPY WITH MEDIAL MENISECTOMY Left 12/02/2020   Procedure: KNEE ARTHROSCOPY WITH DEBRIDEMENT AND PARTIAL MEDIAL AND LATERAL MENISECTOMY;  Surgeon: Christena Flake, MD;  Location: [facility];  Service: Orthopedics;  Laterality: Left;   LITHOTRIPSY     PERCUTANEOUS REMOVAL  INTRA-AORTIC BALLOON CATH N/A 12/26/2012   Procedure: PERCUTANEOUS REMOVAL INTRA-AORTIC BALLOON CATH; Surgeon: Heloise Ochoa, MD; Location: [facility]; Service: Cardiothoracic   RIGHT HEART CATH Right 07/13/2016   Location: [facility]; Surgeon: Emilio Math, MD   TRANSESOPHAGEAL ECHOCARDIOGRAM N/A 12/26/2012   Procedure: TRANSESOPHAGEAL ECHOCARDIOGRAPHY; Surgeon: Heloise Ochoa, MD; Location: [facility]; Service: Cardiothoracic   UMBILICAL HERNIA REPAIR N/A 02/13/2014   Procedure: LAPAROSCOPIC UMBILICAL HERNIA REPAIR; Surgeon: Merlinda Frederick, MD; Location: [facility]; Service: General Surgery     Current Outpatient Medications  Medication Sig Dispense Refill   acetaminophen (TYLENOL) 650 MG CR tablet Take 1,300 mg by mouth every 8 (eight) hours as needed for pain.     albuterol (VENTOLIN HFA) 108 (90 Base) MCG/ACT inhaler Inhale 2 puffs into the lungs every 6 (six) hours as needed for wheezing or shortness of breath.     Alcohol Swabs (DROPSAFE ALCOHOL PREP) 70 % PADS Apply 1 each topically as needed (With repatha and blood sugar checks.).     Ascorbic Acid (VITAMIN C) 1000 MG tablet Take 1,000 mg by mouth at bedtime.     aspirin EC 81 MG EC tablet Take 1 tablet (81 mg total) by mouth daily.     busPIRone (BUSPAR) 5 MG tablet TAKE 1 TABLET BY MOUTH TWICE DAILY AS NEEDED 60 tablet 3   calcitRIOL (ROCALTROL) 0.25 MCG capsule Take 0.25 mcg by mouth daily.     citalopram (CELEXA) 40 MG tablet TAKE 1 TABLET EVERY DAY 90 tablet 3   clindamycin (CLEOCIN-T) 1 % lotion Apply topically daily. Mix with hydrocortisone 2.5% cream and apply to rash on right armpit twice per day until rash resolves, then stop 60 mL 1   cyclobenzaprine (FLEXERIL) 5 MG tablet Take 5 mg by mouth at bedtime as needed for muscle spasms (or cramping in legs.  May half tablet if needed).     ENTRESTO 24-26 MG Take 1 tablet by mouth 2 (two) times daily.     Evolocumab (REPATHA SURECLICK) 140 MG/ML  SOAJ INJECT CONTENT OF 1 CARTRIDGE UNDER THE SKIN EVERY OTHER WEEK 6 mL 1   ezetimibe (ZETIA) 10 MG tablet TAKE 1 TABLET(10 MG) BY MOUTH AT BEDTIME 90 tablet 3   febuxostat (ULORIC) 40 MG tablet Take 40 mg by mouth daily.     fexofenadine (ALLEGRA) 180 MG tablet Take 180 mg by mouth daily as needed for allergies or rhinitis.     fluticasone (FLONASE) 50 MCG/ACT nasal spray Place 2 sprays into both nostrils daily.  2   Fluticasone-Umeclidin-Vilant (TRELEGY ELLIPTA) 100-62.5-25 MCG/ACT AEPB Inhale 1 Inhalation into the lungs daily at 6 (six) AM. 60 each 11   gabapentin (NEURONTIN) 400 MG capsule TAKE 1 CAPSULE AT BEDTIME 90 capsule 3   guaiFENesin-codeine (GUAIFENESIN AC) 100-10 MG/5ML syrup Take 5 mLs by mouth 3 (three) times daily as needed for cough. 120 mL 0   hydrocortisone  2.5 % cream Apply topically 2 (two) times daily as needed (Rash). Mix with clindamycin lotion and apply to right armpit rash twice per day until rash resolves, then stop 30 g 1   ipratropium-albuterol (DUONEB) 0.5-2.5 (3) MG/3ML SOLN Take 3 mLs by nebulization every 4 (four) hours as needed. 360 mL 3   losartan (COZAAR) 25 MG tablet Take 1 tablet (25 mg total) by mouth daily. 30 tablet 11   metoprolol succinate (TOPROL-XL) 50 MG 24 hr tablet Take 50 mg by mouth every morning. Take with or immediately following a meal.     Multiple Vitamins-Minerals (PRESERVISION AREDS) CAPS Take by mouth.     OXYGEN Inhale 3 L into the lungs See admin instructions. Used as needed throughout the day and continuous at bedtime     pantoprazole (PROTONIX) 40 MG tablet Take 40 mg by mouth every morning.     Simethicone (GAS-X PO) Take 1 tablet by mouth 2 (two) times daily as needed (flatulence).     sodium chloride (OCEAN) 0.65 % SOLN nasal spray Place 1 spray into both nostrils as needed for congestion.  0   SUPER B COMPLEX/C PO Take 1 tablet by mouth daily.     tamsulosin (FLOMAX) 0.4 MG CAPS capsule TAKE 1 CAPSULE EVERY DAY 90 capsule 3    torsemide (DEMADEX) 10 MG tablet Take 1 tablet (10 mg total) by mouth daily. 30 tablet 11   No current facility-administered medications for this visit.    Allergies:   Nsaids, Statins, and Nexletol [bempedoic acid]    Social History:   reports that he quit smoking about 14 years ago. His smoking use included cigarettes. He started smoking about 68 years ago. He has a 108 pack-year smoking history. He quit smokeless tobacco use about 18 years ago.  His smokeless tobacco use included chew. He reports that he does not drink alcohol and does not use drugs.   Family History:  family history includes Arthritis in his son; Asthma in his daughter; COPD in his daughter; Depression in his daughter; Diabetes in his daughter and son; Hyperlipidemia in his daughter; Hypertension in his son; Obesity in his daughter; Varicose Veins in his daughter. He was adopted.    ROS:     Review of Systems  Constitutional: Negative.   HENT: Negative.    Eyes: Negative.   Respiratory: Negative.    Gastrointestinal: Negative.   Genitourinary: Negative.   Musculoskeletal: Negative.   Skin: Negative.   Neurological: Negative.   Endo/Heme/Allergies: Negative.   Psychiatric/Behavioral: Negative.    All other systems reviewed and are negative.     All other systems are reviewed and negative.    PHYSICAL EXAM: VS:  BP (!) 140/60   Pulse 62   Ht 5\' 3"  (1.6 m)   Wt 201 lb 9.6 oz (91.4 kg)   SpO2 95%   BMI 35.71 kg/m  , BMI Body mass index is 35.71 kg/m. Last weight:  Wt Readings from Last 3 Encounters:  12/07/22 201 lb 9.6 oz (91.4 kg)  11/28/22 199 lb (90.3 kg)  10/17/22 201 lb 1 oz (91.2 kg)     Physical Exam Vitals reviewed.  Constitutional:      Appearance: Normal appearance. He is normal weight.  HENT:     Head: Normocephalic.     Nose: Nose normal.     Mouth/Throat:     Mouth: Mucous membranes are moist.  Eyes:     Pupils: Pupils are equal, round, and reactive to light.  Cardiovascular:      Rate and Rhythm: Normal rate and regular rhythm.     Pulses: Normal pulses.     Heart sounds: Normal heart sounds.  Pulmonary:     Effort: Pulmonary effort is normal.  Abdominal:     General: Abdomen is flat. Bowel sounds are normal.  Musculoskeletal:        General: Normal range of motion.     Cervical back: Normal range of motion.  Skin:    General: Skin is warm.  Neurological:     General: No focal deficit present.     Mental Status: He is alert.  Psychiatric:        Mood and Affect: Mood normal.       EKG:   Recent Labs: 11/28/2022: ALT 6; BUN 51; Creatinine, Ser 3.31; Hemoglobin 10.7; Platelets 125; Potassium 4.9; Sodium 141; TSH 2.940    Lipid Panel    Component Value Date/Time   CHOL 106 11/28/2022 1050   CHOL 100 07/26/2012 0111   TRIG 169 (H) 11/28/2022 1050   TRIG 157 07/26/2012 0111   HDL 35 (L) 11/28/2022 1050   HDL 35 (L) 07/26/2012 0111   CHOLHDL 3.0 11/28/2022 1050   VLDL 31 07/26/2012 0111   LDLCALC 43 11/28/2022 1050   LDLCALC 34 07/26/2012 0111      Other studies Reviewed: Additional studies/ records that were reviewed today include:  Review of the above records demonstrates:       No data to display            ASSESSMENT AND PLAN:    ICD-10-CM   1. Coronary artery disease involving native coronary artery of native heart with other form of angina pectoris (HCC)  I25.118    stable    2. HFrEF (heart failure with reduced ejection fraction) (HCC)  I50.20     3. Mixed hyperlipidemia  E78.2     4. Claudication of both lower extremities (HCC)  I73.9        Problem List Items Addressed This Visit       Cardiovascular and Mediastinum   Coronary artery disease - Primary   HFrEF (heart failure with reduced ejection fraction) (HCC)     Other   Hyperlipidemia   Claudication of both lower extremities (HCC)       Disposition:   No follow-ups on file.    Total time spent: 35 minutes  Signed,  Adrian Blackwater, MD  12/07/2022  10:33 AM    [facility]

## 2022-12-11 ENCOUNTER — Telehealth: Payer: Self-pay

## 2022-12-11 DIAGNOSIS — N184 Chronic kidney disease, stage 4 (severe): Secondary | ICD-10-CM | POA: Diagnosis not present

## 2022-12-11 DIAGNOSIS — I129 Hypertensive chronic kidney disease with stage 1 through stage 4 chronic kidney disease, or unspecified chronic kidney disease: Secondary | ICD-10-CM | POA: Diagnosis not present

## 2022-12-11 DIAGNOSIS — E1122 Type 2 diabetes mellitus with diabetic chronic kidney disease: Secondary | ICD-10-CM | POA: Diagnosis not present

## 2022-12-11 DIAGNOSIS — R809 Proteinuria, unspecified: Secondary | ICD-10-CM | POA: Diagnosis not present

## 2022-12-11 DIAGNOSIS — N050 Unspecified nephritic syndrome with minor glomerular abnormality: Secondary | ICD-10-CM | POA: Diagnosis not present

## 2022-12-11 DIAGNOSIS — C4492 Squamous cell carcinoma of skin, unspecified: Secondary | ICD-10-CM

## 2022-12-11 DIAGNOSIS — D631 Anemia in chronic kidney disease: Secondary | ICD-10-CM | POA: Diagnosis not present

## 2022-12-11 DIAGNOSIS — N2581 Secondary hyperparathyroidism of renal origin: Secondary | ICD-10-CM | POA: Diagnosis not present

## 2022-12-11 NOTE — Telephone Encounter (Signed)
-----   Message from [facility] sent at 12/11/2022  4:54 PM EDT ----- Diagnosis: 1. 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 Skin , left superior forehead MODERATELY DIFFERENTIATED SQUAMOUS CELL CARCINOMA, BASE INVOLVED  Please call with diagnosis and determine where the patient would like to have Mohs surgery.  Explanation: Both biopsies show squamous cell skin cancers. They have the potential to spread beyond the skin and threaten your health, so I recommend treating them. The one on your right cheek may be the same skin cancer coming back, so it needs Mohs surgery to ensure it is fully removed. The one on your left upper forehead  is invading the second layer of the skin, so it also needs Mohs surgery.   Treatment: Mohs surgery involves cutting out the skin cancer and then checking under the microscope to ensure the whole skin cancer was removed. If any skin cancer remains, the surgeon will cut out more until it is fully removed. The cure rate is about 98-99%. Once the Mohs surgeon confirms the skin cancer is out, they will discuss the options to repair or heal the area. You must take it easy for about two weeks after surgery (no lifting over 10-15 lbs, avoid activity to get your heart rate and blood pressure up). It is done at another office outside of [place] ([place], [place], or [place]).  If the patient asks for my recommendation for Mohs surgeon, I recommend Dr Caprice Beaver or Dr Coralie Carpen at [facility] if the patient is able to make the trip.

## 2022-12-11 NOTE — Telephone Encounter (Signed)
Patient and patient's wife advised pathology results showed SCC and SCCis. Patient has had previous Mohs with Dr. Adriana Simas at [facility] and would like referral sent to him. Referral sent. Butch Penny., RMA

## 2022-12-13 DIAGNOSIS — I129 Hypertensive chronic kidney disease with stage 1 through stage 4 chronic kidney disease, or unspecified chronic kidney disease: Secondary | ICD-10-CM | POA: Diagnosis not present

## 2022-12-13 DIAGNOSIS — R809 Proteinuria, unspecified: Secondary | ICD-10-CM | POA: Diagnosis not present

## 2022-12-13 DIAGNOSIS — D631 Anemia in chronic kidney disease: Secondary | ICD-10-CM | POA: Diagnosis not present

## 2022-12-13 DIAGNOSIS — N2581 Secondary hyperparathyroidism of renal origin: Secondary | ICD-10-CM | POA: Diagnosis not present

## 2022-12-13 DIAGNOSIS — E1122 Type 2 diabetes mellitus with diabetic chronic kidney disease: Secondary | ICD-10-CM | POA: Diagnosis not present

## 2022-12-13 DIAGNOSIS — N050 Unspecified nephritic syndrome with minor glomerular abnormality: Secondary | ICD-10-CM | POA: Diagnosis not present

## 2022-12-13 DIAGNOSIS — N184 Chronic kidney disease, stage 4 (severe): Secondary | ICD-10-CM | POA: Diagnosis not present

## 2023-01-10 DIAGNOSIS — H353221 Exudative age-related macular degeneration, left eye, with active choroidal neovascularization: Secondary | ICD-10-CM | POA: Diagnosis not present

## 2023-01-16 ENCOUNTER — Ambulatory Visit: Payer: Medicare HMO | Admitting: Family

## 2023-01-18 DIAGNOSIS — M159 Polyosteoarthritis, unspecified: Secondary | ICD-10-CM | POA: Diagnosis not present

## 2023-01-18 DIAGNOSIS — M1A09X0 Idiopathic chronic gout, multiple sites, without tophus (tophi): Secondary | ICD-10-CM | POA: Diagnosis not present

## 2023-01-18 DIAGNOSIS — Z79899 Other long term (current) drug therapy: Secondary | ICD-10-CM | POA: Diagnosis not present

## 2023-01-30 ENCOUNTER — Inpatient Hospital Stay: Payer: Medicare HMO | Attending: Oncology

## 2023-01-30 DIAGNOSIS — D509 Iron deficiency anemia, unspecified: Secondary | ICD-10-CM

## 2023-01-30 DIAGNOSIS — N184 Chronic kidney disease, stage 4 (severe): Secondary | ICD-10-CM | POA: Diagnosis not present

## 2023-01-30 DIAGNOSIS — D631 Anemia in chronic kidney disease: Secondary | ICD-10-CM | POA: Diagnosis not present

## 2023-01-30 LAB — CBC
HCT: 31.9 % — ABNORMAL LOW (ref 39.0–52.0)
Hemoglobin: 10.5 g/dL — ABNORMAL LOW (ref 13.0–17.0)
MCH: 32.3 pg (ref 26.0–34.0)
MCHC: 32.9 g/dL (ref 30.0–36.0)
MCV: 98.2 fL (ref 80.0–100.0)
Platelets: 172 10*3/uL (ref 150–400)
RBC: 3.25 MIL/uL — ABNORMAL LOW (ref 4.22–5.81)
RDW: 13 % (ref 11.5–15.5)
WBC: 6.2 10*3/uL (ref 4.0–10.5)
nRBC: 0 % (ref 0.0–0.2)

## 2023-01-30 LAB — IRON AND TIBC
Iron: 74 ug/dL (ref 45–182)
Saturation Ratios: 28 % (ref 17.9–39.5)
TIBC: 267 ug/dL (ref 250–450)
UIBC: 193 ug/dL

## 2023-01-30 LAB — VITAMIN B12: Vitamin B-12: 637 pg/mL (ref 180–914)

## 2023-01-30 LAB — FERRITIN: Ferritin: 77 ng/mL (ref 24–336)

## 2023-02-02 DIAGNOSIS — E1142 Type 2 diabetes mellitus with diabetic polyneuropathy: Secondary | ICD-10-CM | POA: Diagnosis not present

## 2023-02-02 DIAGNOSIS — H353211 Exudative age-related macular degeneration, right eye, with active choroidal neovascularization: Secondary | ICD-10-CM | POA: Diagnosis not present

## 2023-02-02 DIAGNOSIS — B351 Tinea unguium: Secondary | ICD-10-CM | POA: Diagnosis not present

## 2023-02-04 ENCOUNTER — Other Ambulatory Visit: Payer: Self-pay | Admitting: Cardiovascular Disease

## 2023-02-04 ENCOUNTER — Observation Stay
Admission: EM | Admit: 2023-02-04 | Discharge: 2023-02-06 | Disposition: A | Discharge status: Home / Self Care | Payer: Medicare HMO | Attending: Emergency Medicine | Admitting: Emergency Medicine

## 2023-02-04 ENCOUNTER — Emergency Department: Payer: Medicare HMO

## 2023-02-04 ENCOUNTER — Other Ambulatory Visit: Payer: Self-pay

## 2023-02-04 ENCOUNTER — Observation Stay: Payer: Medicare HMO

## 2023-02-04 DIAGNOSIS — N179 Acute kidney failure, unspecified: Secondary | ICD-10-CM | POA: Diagnosis present

## 2023-02-04 DIAGNOSIS — Z7982 Long term (current) use of aspirin: Secondary | ICD-10-CM | POA: Insufficient documentation

## 2023-02-04 DIAGNOSIS — Z952 Presence of prosthetic heart valve: Secondary | ICD-10-CM | POA: Diagnosis not present

## 2023-02-04 DIAGNOSIS — Z87891 Personal history of nicotine dependence: Secondary | ICD-10-CM | POA: Insufficient documentation

## 2023-02-04 DIAGNOSIS — J441 Chronic obstructive pulmonary disease with (acute) exacerbation: Secondary | ICD-10-CM | POA: Diagnosis present

## 2023-02-04 DIAGNOSIS — J9601 Acute respiratory failure with hypoxia: Secondary | ICD-10-CM | POA: Insufficient documentation

## 2023-02-04 DIAGNOSIS — E7889 Other lipoprotein metabolism disorders: Secondary | ICD-10-CM | POA: Diagnosis not present

## 2023-02-04 DIAGNOSIS — Z8673 Personal history of transient ischemic attack (TIA), and cerebral infarction without residual deficits: Secondary | ICD-10-CM | POA: Insufficient documentation

## 2023-02-04 DIAGNOSIS — K529 Noninfective gastroenteritis and colitis, unspecified: Principal | ICD-10-CM

## 2023-02-04 DIAGNOSIS — E039 Hypothyroidism, unspecified: Secondary | ICD-10-CM | POA: Insufficient documentation

## 2023-02-04 DIAGNOSIS — Z85828 Personal history of other malignant neoplasm of skin: Secondary | ICD-10-CM | POA: Diagnosis not present

## 2023-02-04 DIAGNOSIS — K219 Gastro-esophageal reflux disease without esophagitis: Secondary | ICD-10-CM | POA: Diagnosis present

## 2023-02-04 DIAGNOSIS — I130 Hypertensive heart and chronic kidney disease with heart failure and stage 1 through stage 4 chronic kidney disease, or unspecified chronic kidney disease: Secondary | ICD-10-CM | POA: Diagnosis not present

## 2023-02-04 DIAGNOSIS — R1084 Generalized abdominal pain: Secondary | ICD-10-CM

## 2023-02-04 DIAGNOSIS — J449 Chronic obstructive pulmonary disease, unspecified: Secondary | ICD-10-CM | POA: Diagnosis not present

## 2023-02-04 DIAGNOSIS — N200 Calculus of kidney: Secondary | ICD-10-CM | POA: Diagnosis not present

## 2023-02-04 DIAGNOSIS — G4733 Obstructive sleep apnea (adult) (pediatric): Secondary | ICD-10-CM

## 2023-02-04 DIAGNOSIS — R197 Diarrhea, unspecified: Secondary | ICD-10-CM

## 2023-02-04 DIAGNOSIS — R109 Unspecified abdominal pain: Principal | ICD-10-CM | POA: Diagnosis present

## 2023-02-04 DIAGNOSIS — I10 Essential (primary) hypertension: Secondary | ICD-10-CM | POA: Diagnosis not present

## 2023-02-04 DIAGNOSIS — I5020 Unspecified systolic (congestive) heart failure: Secondary | ICD-10-CM | POA: Diagnosis not present

## 2023-02-04 DIAGNOSIS — N184 Chronic kidney disease, stage 4 (severe): Secondary | ICD-10-CM | POA: Insufficient documentation

## 2023-02-04 DIAGNOSIS — Z79899 Other long term (current) drug therapy: Secondary | ICD-10-CM | POA: Diagnosis not present

## 2023-02-04 DIAGNOSIS — K5730 Diverticulosis of large intestine without perforation or abscess without bleeding: Secondary | ICD-10-CM | POA: Insufficient documentation

## 2023-02-04 DIAGNOSIS — J9611 Chronic respiratory failure with hypoxia: Secondary | ICD-10-CM | POA: Diagnosis present

## 2023-02-04 DIAGNOSIS — E878 Other disorders of electrolyte and fluid balance, not elsewhere classified: Secondary | ICD-10-CM | POA: Diagnosis present

## 2023-02-04 DIAGNOSIS — I2510 Atherosclerotic heart disease of native coronary artery without angina pectoris: Secondary | ICD-10-CM | POA: Diagnosis not present

## 2023-02-04 DIAGNOSIS — E1122 Type 2 diabetes mellitus with diabetic chronic kidney disease: Secondary | ICD-10-CM | POA: Diagnosis present

## 2023-02-04 HISTORY — DX: Diarrhea, unspecified: R19.7

## 2023-02-04 HISTORY — DX: Other disorders of electrolyte and fluid balance, not elsewhere classified: E87.8

## 2023-02-04 LAB — CBC
HCT: 37.8 % — ABNORMAL LOW (ref 39.0–52.0)
HCT: 32.8 % — ABNORMAL LOW (ref 39.0–52.0)
Hemoglobin: 12.3 g/dL — ABNORMAL LOW (ref 13.0–17.0)
Hemoglobin: 10.9 g/dL — ABNORMAL LOW (ref 13.0–17.0)
MCH: 31.9 pg (ref 26.0–34.0)
MCH: 32.4 pg (ref 26.0–34.0)
MCHC: 32.5 g/dL (ref 30.0–36.0)
MCHC: 33.2 g/dL (ref 30.0–36.0)
MCV: 98.2 fL (ref 80.0–100.0)
MCV: 97.6 fL (ref 80.0–100.0)
Platelets: 221 10*3/uL (ref 150–400)
Platelets: 167 10*3/uL (ref 150–400)
RBC: 3.85 MIL/uL — ABNORMAL LOW (ref 4.22–5.81)
RBC: 3.36 MIL/uL — ABNORMAL LOW (ref 4.22–5.81)
RDW: 13.5 % (ref 11.5–15.5)
RDW: 13.5 % (ref 11.5–15.5)
WBC: 10 10*3/uL (ref 4.0–10.5)
WBC: 5.9 10*3/uL (ref 4.0–10.5)
nRBC: 0 % (ref 0.0–0.2)
nRBC: 0 % (ref 0.0–0.2)

## 2023-02-04 LAB — GASTROINTESTINAL PANEL BY PCR, STOOL (REPLACES STOOL CULTURE)

## 2023-02-04 LAB — C DIFFICILE QUICK SCREEN W PCR REFLEX
C Diff antigen: NEGATIVE
C Diff interpretation: NOT DETECTED
C Diff toxin: NEGATIVE

## 2023-02-04 LAB — HEPATIC FUNCTION PANEL
ALT: 10 U/L (ref 0–44)
AST: 15 U/L (ref 15–41)
Albumin: 4 g/dL (ref 3.5–5.0)
Alkaline Phosphatase: 78 U/L (ref 38–126)
Bilirubin, Direct: <0.1 mg/dL (ref 0.0–0.2)
Total Bilirubin: 0.7 mg/dL (ref 0.3–1.2)
Total Protein: 8.4 g/dL — ABNORMAL HIGH (ref 6.5–8.1)

## 2023-02-04 LAB — BASIC METABOLIC PANEL WITH GFR
Anion gap: 16 — ABNORMAL HIGH (ref 5–15)
BUN: 85 mg/dL — ABNORMAL HIGH (ref 8–23)
CO2: 15 mmol/L — ABNORMAL LOW (ref 22–32)
Calcium: 8.4 mg/dL — ABNORMAL LOW (ref 8.9–10.3)
Chloride: 103 mmol/L (ref 98–111)
Creatinine, Ser: 5.01 mg/dL — ABNORMAL HIGH (ref 0.61–1.24)
GFR, Estimated: 11 mL/min — ABNORMAL LOW
Glucose, Bld: 109 mg/dL — ABNORMAL HIGH (ref 70–99)
Potassium: 3.8 mmol/L (ref 3.5–5.1)
Sodium: 134 mmol/L — ABNORMAL LOW (ref 135–145)

## 2023-02-04 LAB — CREATININE, SERUM
Creatinine, Ser: 4.77 mg/dL — ABNORMAL HIGH (ref 0.61–1.24)
GFR, Estimated: 12 mL/min — ABNORMAL LOW (ref >60)

## 2023-02-04 LAB — BRAIN NATRIURETIC PEPTIDE: B Natriuretic Peptide: 77.7 pg/mL (ref 0.0–100.0)

## 2023-02-04 LAB — CBG MONITORING, ED: Glucose-Capillary: 104 mg/dL — ABNORMAL HIGH (ref 70–99)

## 2023-02-04 LAB — TROPONIN I (HIGH SENSITIVITY): Troponin I (High Sensitivity): 10 ng/L (ref <18)

## 2023-02-04 LAB — LACTOFERRIN, FECAL, QUALITATIVE: Lactoferrin, Fecal, Qual: POSITIVE — AB

## 2023-02-04 LAB — MAGNESIUM: Magnesium: 1.5 mg/dL — ABNORMAL LOW (ref 1.7–2.4)

## 2023-02-04 LAB — LIPASE, BLOOD: Lipase: 56 U/L — ABNORMAL HIGH (ref 11–51)

## 2023-02-04 MED ORDER — FEBUXOSTAT 40 MG PO TABS
40.0000 mg | ORAL_TABLET | Freq: Every day | ORAL | Status: DC
  Administered 2023-02-05 – 2023-02-06 (×2): 40 mg via ORAL
  Filled 2023-02-04 (×2): qty 1

## 2023-02-04 MED ORDER — LOPERAMIDE HCL 2 MG PO CAPS: 2.0000 mg | ORAL_CAPSULE | Freq: Three times a day (TID) | ORAL | Status: DC | PRN

## 2023-02-04 MED ORDER — MORPHINE SULFATE (PF) 2 MG/ML IV SOLN: 2.0000 mg | INTRAVENOUS | Status: DC | PRN

## 2023-02-04 MED ORDER — SODIUM BICARBONATE 650 MG PO TABS
650.0000 mg | ORAL_TABLET | Freq: Three times a day (TID) | ORAL | Status: DC
  Administered 2023-02-04 – 2023-02-06 (×5): 650 mg via ORAL
  Filled 2023-02-04 (×5): qty 1

## 2023-02-04 MED ORDER — PANTOPRAZOLE SODIUM 40 MG IV SOLR
40.0000 mg | Freq: Two times a day (BID) | INTRAVENOUS | Status: DC
  Administered 2023-02-04 – 2023-02-06 (×4): 40 mg via INTRAVENOUS
  Filled 2023-02-04 (×4): qty 10

## 2023-02-04 MED ORDER — RISAQUAD PO CAPS
2.0000 | ORAL_CAPSULE | Freq: Three times a day (TID) | ORAL | Status: DC
  Administered 2023-02-04 – 2023-02-06 (×5): 2 via ORAL
  Filled 2023-02-04 (×5): qty 2

## 2023-02-04 MED ORDER — TAMSULOSIN HCL 0.4 MG PO CAPS
0.4000 mg | ORAL_CAPSULE | Freq: Every day | ORAL | Status: DC
  Administered 2023-02-05 – 2023-02-06 (×2): 0.4 mg via ORAL
  Filled 2023-02-04 (×2): qty 1

## 2023-02-04 MED ORDER — INSULIN ASPART 100 UNIT/ML IJ SOLN
0.0000 [IU] | Freq: Three times a day (TID) | INTRAMUSCULAR | Status: DC
  Administered 2023-02-05: 2 [IU] via SUBCUTANEOUS
  Filled 2023-02-04 (×2): qty 1

## 2023-02-04 MED ORDER — HYDROCODONE-ACETAMINOPHEN 5-325 MG PO TABS
1.0000 | ORAL_TABLET | ORAL | Status: DC | PRN
  Filled 2023-02-04: qty 1

## 2023-02-04 MED ORDER — METOPROLOL SUCCINATE ER 50 MG PO TB24
50.0000 mg | ORAL_TABLET | Freq: Every day | ORAL | Status: DC
  Administered 2023-02-05 – 2023-02-06 (×2): 50 mg via ORAL
  Filled 2023-02-04 (×2): qty 1

## 2023-02-04 MED ORDER — ONDANSETRON HCL 4 MG/2ML IJ SOLN
4.0000 mg | Freq: Once | INTRAMUSCULAR | Status: AC
  Administered 2023-02-04: 4 mg via INTRAVENOUS
  Filled 2023-02-04: qty 2

## 2023-02-04 MED ORDER — ASPIRIN 81 MG PO TBEC
81.0000 mg | DELAYED_RELEASE_TABLET | Freq: Every day | ORAL | Status: DC
  Administered 2023-02-05 – 2023-02-06 (×2): 81 mg via ORAL
  Filled 2023-02-04 (×2): qty 1

## 2023-02-04 MED ORDER — LACTATED RINGERS IV BOLUS
1000.0000 mL | Freq: Once | INTRAVENOUS | Status: AC
  Administered 2023-02-04: 1000 mL via INTRAVENOUS

## 2023-02-04 MED ORDER — IPRATROPIUM-ALBUTEROL 0.5-2.5 (3) MG/3ML IN SOLN: 3.0000 mL | RESPIRATORY_TRACT | Status: DC | PRN

## 2023-02-04 MED ORDER — CITALOPRAM HYDROBROMIDE 20 MG PO TABS
40.0000 mg | ORAL_TABLET | Freq: Every day | ORAL | Status: DC
  Administered 2023-02-05 – 2023-02-06 (×2): 40 mg via ORAL
  Filled 2023-02-04 (×2): qty 2

## 2023-02-04 MED ORDER — ACETAMINOPHEN 650 MG RE SUPP: 650.0000 mg | Freq: Four times a day (QID) | RECTAL | Status: DC | PRN

## 2023-02-04 MED ORDER — SODIUM CHLORIDE 0.9 % IV SOLN: INTRAVENOUS | Status: DC

## 2023-02-04 MED ORDER — SIMETHICONE 80 MG PO CHEW
80.0000 mg | CHEWABLE_TABLET | Freq: Four times a day (QID) | ORAL | Status: AC
  Administered 2023-02-04: 80 mg via ORAL
  Filled 2023-02-04: qty 1

## 2023-02-04 MED ORDER — HEPARIN SODIUM (PORCINE) 5000 UNIT/ML IJ SOLN
5000.0000 [IU] | Freq: Three times a day (TID) | INTRAMUSCULAR | Status: DC
  Administered 2023-02-04 – 2023-02-06 (×5): 5000 [IU] via SUBCUTANEOUS
  Filled 2023-02-04 (×5): qty 1

## 2023-02-04 MED ORDER — SODIUM CHLORIDE 0.9% FLUSH
3.0000 mL | Freq: Two times a day (BID) | INTRAVENOUS | Status: DC
  Administered 2023-02-04 – 2023-02-06 (×4): 3 mL via INTRAVENOUS

## 2023-02-04 MED ORDER — ACETAMINOPHEN 325 MG PO TABS: 650.0000 mg | ORAL_TABLET | Freq: Four times a day (QID) | ORAL | Status: DC | PRN

## 2023-02-04 MED ORDER — MAGNESIUM SULFATE 2 GM/50ML IV SOLN
2.0000 g | Freq: Once | INTRAVENOUS | Status: AC
  Administered 2023-02-04: 2 g via INTRAVENOUS
  Filled 2023-02-04: qty 50

## 2023-02-04 NOTE — Assessment & Plan Note (Signed)
PRN imodium q8 x 2 doses. Stool studies /pcr/ occult. Pt's last colonoscopy was in 2015 as he is high risk for anesthesia.

## 2023-02-04 NOTE — ED Triage Notes (Addendum)
Pt sts that he has been having diarrhea since Wednesday with weakness. Pt sts that he also has been vomiting this morning and in the past week. Daughter sts that her father felt like this when he was in CHF the last time.

## 2023-02-04 NOTE — ED Notes (Signed)
RT called for CPAP placement.

## 2023-02-04 NOTE — Assessment & Plan Note (Signed)
SpO2: 98 % on 3 L.  Cont same and CPAP for bedtime.

## 2023-02-04 NOTE — ED Notes (Signed)
Assumed care of pt at this time. Pt is AAXO4, on CCM, VS stable and WNL. Pt requested to use bedside toilet. Assisted pt to bedside toilet safely, call light within reach

## 2023-02-04 NOTE — Assessment & Plan Note (Signed)
Replete and recheck

## 2023-02-04 NOTE — ED Notes (Signed)
Pt daughter and wife leaving for the night, taking pt belongings including home O2

## 2023-02-04 NOTE — Assessment & Plan Note (Signed)
Lab Results  Component Value Date   CREATININE 4.77 (H) 02/04/2023   CREATININE 5.01 (H) 02/04/2023   CREATININE 3.31 (H) 11/28/2022  Creatinine improving with MIVF and will start sodium bicarbonate tabs to help with acidosis and prevent hyperkalemia.  Renally dose meds. Nephrology consult as needed currently trending down.

## 2023-02-04 NOTE — Assessment & Plan Note (Signed)
>>  ASSESSMENT AND PLAN FOR TYPE 2 DIABETES MELLITUS WITH DIABETIC CHRONIC KIDNEY DISEASE (HCC) WRITTEN ON 02/04/2023 10:23 PM BY PATEL, EKTA V, MD  Carb consistent diet and glycemic protocol.

## 2023-02-04 NOTE — Assessment & Plan Note (Signed)
Carb consistent diet and glycemic protocol.

## 2023-02-04 NOTE — Assessment & Plan Note (Signed)
Vitals:   02/04/23 1618 02/04/23 1900 02/04/23 2157  BP: 103/62 (!) 165/69 (!) 138/117  Continue metoprolol only.

## 2023-02-04 NOTE — Assessment & Plan Note (Signed)
PRN duoneb and albuterol.

## 2023-02-04 NOTE — Hospital Course (Signed)
Chief complaint abdominal pain/diarrhea.

## 2023-02-04 NOTE — Assessment & Plan Note (Signed)
CPAP at bedtime.

## 2023-02-04 NOTE — H&P (Signed)
History and Physical    Patient: [name] [name] CBJ:[id number] DOB: 03-14-1946 DOA: 02/04/2023 DOS: the patient was seen and examined on 02/05/2023 PCP: Miki Kins, FNP  Patient coming from: Home   Chief Complaint:  Chief Complaint  Patient presents with   Abdominal Pain    HPI: [name] [name] is a 77 y.o. male with medical history significant for history of stroke, history of AKI, history of heart disease, hypertension, COPD, diabetes mellitus type 2, hypothyroidism, nephrolithiasis, history of pneumonia, aortic valve replaced status post TAVR, history of tobacco abuse, coming for abdominal pain and diarrhea no fever or chills or cough. No report of chest pan but abd bloating. In the emergency room patient is alert awake oriented with stable vital signs, afebrile at 97.8 heart rate of 69 respirations of 17 and blood pressure of 103/62 O2 sats 98%.  Initial glucose of 104 metabolic panel showing hyponatremia of 134 bicarb of 15 glucose 109 creatinine of 5.01 gap of 16, magnesium 1.5, lipase 56, total protein 8.4. CBC shows normal white count of 10 hemoglobin of 12.3 and normal platelet count of 221.  C. difficile testing is negative in the emergency room for C. difficile antigen and toxin,EKG today shows sinus rhythm at 68 PR interval 188, QRS of 92, QTc prolonged at 497, LVH criteria with a tall R wave in aVL.  No ST T wave inversions otherwise.  Patient had CT of the abdomen pelvis without contrast done in the emergency room which was negative for any acute findings in the abdomen or pelvis.  Patient had an abnormal myocardial perfusion study in May of this year showing:Ischemia in the RCA territory with borderline LVEF dysfunction, advise CCTA, and has seen Dr. Adrian Blackwater will defer to am team to get record from dr Rosine Beat office for any additional f/u plan or recommendayion.   Review of Systems: Review of Systems  Respiratory:  Positive for shortness of breath.    Cardiovascular:  Negative for leg swelling.  Gastrointestinal:  Positive for abdominal pain, diarrhea, nausea and vomiting.  Neurological:  Positive for weakness.   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 post. helix. 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   CAD (coronary artery disease)    CAP (community acquired pneumonia) 07/26/2020   CKD (chronic kidney disease), stage IV (HCC)    Complete tear of right rotator cuff 11/15/2015   Complex tear of lateral meniscus of left knee as current injury 12/06/2020   Complex tear of medial meniscus of left knee as current injury 10/18/2020   COPD (chronic obstructive pulmonary disease) (HCC)    DM (diabetes mellitus), type 2 (HCC) 07/30/2020   no meds   DOE (dyspnea on exertion)    GERD (gastroesophageal reflux disease)    Gout    Grade I diastolic dysfunction    Heart murmur    HFrEF (heart failure with reduced ejection fraction) (HCC)    History of hiatal hernia    History of transcatheter aortic  valve replacement (TAVR) 08/09/2016   HLD (hyperlipidemia)    Hyperkalemia    Hypertension    Hypothyroidism    Kidney stones    Left atrial dilation    Migraines    OSA on CPAP    Personal history of other malignant neoplasm of skin 04/08/2012   Pneumonia 07/2020   Recurrent nephrolithiasis 12/27/2012   Respiratory failure (HCC) 07/05/2016   Right atrial dilation    Rotator cuff tendinitis, right 11/15/2015   S/P TAVR (transcatheter aortic valve replacement)  08/01/2016   Formatting of this note is different from the original.  Performed by Dr. Zebedee Iba on 08/01/16:  29 mm commercially available Medtronic Core Valve transcatheter aortic valve procedure placed via transfemoral approach by Dr. Zebedee Iba and co-surgeon Dr. Winona Legato.   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 MOHS.   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/07/2021   Right zygoma, EDC 05/02/21   Supplemental oxygen dependent    3-4 L/[place]   Supplemental oxygen dependent    T2DM (type 2 diabetes mellitus) (HCC)    no meds   Past Surgical History:  Procedure Laterality Date   AORTIC VALVE REPLACEMENT N/A 08/01/2016   29 mm CoreValve Evolut; Location: [facility]; Surgeon: Clent Jacks, MD   CARDIAC CATHETERIZATION N/A 12/26/2012   2v CAD; retained pigtail in myocardium --> transferred to [facility]; Location: [facility]; Surgeon: Despina Hick, MD   CATARACT EXTRACTION W/PHACO Left 10/03/2022   Procedure: CATARACT EXTRACTION PHACO AND INTRAOCULAR LENS PLACEMENT (IOC) LEFT DIABETIC 8.22 00:55.5;  Surgeon: Galen Manila, MD;  Location: [facility];  Service: Ophthalmology;  Laterality: Left;  sleep apnea   CATARACT EXTRACTION W/PHACO Right 10/17/2022   Procedure: CATARACT EXTRACTION PHACO AND INTRAOCULAR LENS PLACEMENT (IOC) RIGHT DIABETIC;  Surgeon: Galen Manila, MD;  Location: [facility];  Service: Ophthalmology;  Laterality: Right;  4.79 0:37.4   COLONOSCOPY     KNEE ARTHROSCOPY WITH MEDIAL MENISECTOMY Left 12/02/2020   Procedure: KNEE ARTHROSCOPY WITH DEBRIDEMENT AND PARTIAL MEDIAL AND LATERAL MENISECTOMY;  Surgeon: Christena Flake, MD;  Location: [facility] OR[facility];  Service: Orthopedics;  Laterality: Left;   LITHOTRIPSY     PERCUTANEOUS REMOVAL INTRA-AORTIC BALLOON CATH N/A 12/26/2012   Procedure: PERCUTANEOUS  REMOVAL INTRA-AORTIC BALLOON CATH; Surgeon: Heloise Ochoa, MD; Location: [facility]; Service: Cardiothoracic   RIGHT HEART CATH Right 07/13/2016   Location: [facility]; Surgeon: Emilio Math, MD   TRANSESOPHAGEAL ECHOCARDIOGRAM N/A 12/26/2012   Procedure: TRANSESOPHAGEAL ECHOCARDIOGRAPHY; Surgeon: Heloise Ochoa, MD; Location: [facility]; Service: Cardiothoracic   UMBILICAL HERNIA REPAIR N/A 02/13/2014   Procedure: LAPAROSCOPIC UMBILICAL HERNIA REPAIR; Surgeon: Merlinda Frederick, MD; Location: [facility]; Service: General Surgery   Social History:   reports that he quit smoking about 14 years ago. His smoking use included cigarettes. He started smoking about 68 years ago. He has a 108 pack-year smoking history. He quit smokeless tobacco use about 18 years ago.  His smokeless tobacco use included chew. He reports that he does not drink alcohol and does not use drugs.  Allergies  Allergen Reactions   Nsaids Other (See Comments)    Avoid due to kidney disease    Statins Other (See Comments)    Myalgias   Nexletol [Bempedoic Acid] Diarrhea    fatigue    Family History  Adopted: Yes  Problem Relation Age of Onset   Varicose Veins Daughter    Obesity Daughter    Hyperlipidemia Daughter    Diabetes Daughter    COPD Daughter    Depression Daughter    Asthma Daughter    Arthritis Son    Diabetes Son    Hypertension Son     Prior to Admission medications   Medication Sig Start Date End Date Taking? Authorizing Provider  acetaminophen (TYLENOL) 650 MG CR tablet Take 1,300 mg by mouth every 8 (eight) hours as needed for pain.    [provider]  albuterol (VENTOLIN HFA) 108 (90 Base) MCG/ACT inhaler Inhale 2 puffs into the lungs every 6 (six) hours as needed for wheezing or shortness of breath. 07/08/14   [provider]  Alcohol Swabs (DROPSAFE ALCOHOL PREP) 70 % PADS Apply 1 each topically as needed (With repatha and blood sugar checks.).  01/19/22   [provider]  Ascorbic Acid (VITAMIN C) 1000 MG tablet Take 1,000 mg by mouth at bedtime.    [provider]  aspirin EC 81 MG EC tablet Take 1 tablet (81 mg total) by mouth daily. 07/11/16   Ezequiel Essex, NP  busPIRone (BUSPAR) 5 MG tablet TAKE 1 TABLET BY MOUTH TWICE DAILY AS NEEDED 09/21/22   Miki Kins, FNP  calcitRIOL (ROCALTROL) 0.25 MCG capsule Take 0.25 mcg by mouth daily.    [provider]  citalopram (CELEXA) 40 MG tablet TAKE 1 TABLET EVERY DAY 08/31/22   Miki Kins, FNP  clindamycin (CLEOCIN-T) 1 % lotion Apply topically daily. Mix with hydrocortisone 2.5% cream and apply to rash on right armpit twice per day until rash resolves, then stop 12/05/22 12/05/23  Elie Goody, MD  cyclobenzaprine (FLEXERIL) 5 MG tablet Take 5 mg by mouth at bedtime as needed for muscle spasms (or cramping in legs.  May half tablet if needed). 05/20/21   [provider]  ENTRESTO 24-26 MG Take 1 tablet by mouth 2 (two) times daily. 08/21/22   [provider]  Evolocumab (REPATHA SURECLICK) 140 MG/ML SOAJ INJECT CONTENT OF 1 CARTRIDGE UNDER THE SKIN EVERY OTHER WEEK 08/21/22   Adrian Blackwater A, MD  ezetimibe (ZETIA) 10 MG tablet TAKE 1 TABLET(10 MG) BY MOUTH AT BEDTIME 07/07/22   Miki Kins, FNP  febuxostat (ULORIC) 40 MG tablet Take 40 mg by mouth daily. 10/09/22   [provider]  fexofenadine (ALLEGRA) 180 MG tablet Take 180 mg by mouth daily as needed for allergies or rhinitis.    [provider]  fluticasone (FLONASE) 50 MCG/ACT nasal spray Place 2 sprays into both nostrils daily. 07/11/16   Ezequiel Essex, NP  Fluticasone-Umeclidin-Vilant (TRELEGY ELLIPTA) 100-62.5-25 MCG/ACT AEPB Inhale 1 Inhalation into the lungs daily at 6 (six) AM. 10/12/22   Miki Kins, FNP  gabapentin (NEURONTIN) 400 MG capsule TAKE 1 CAPSULE AT BEDTIME 08/22/22   Miki Kins, FNP  guaiFENesin-codeine (GUAIFENESIN AC) 100-10  MG/5ML syrup Take 5 mLs by mouth 3 (three) times daily as needed for cough. 06/23/22   Miki Kins, FNP  hydrocortisone 2.5 % cream Apply topically 2 (two) times daily as needed (Rash). Mix with clindamycin lotion and apply to right armpit rash twice per day until rash resolves, then stop 12/05/22   Elie Goody, MD  ipratropium-albuterol (DUONEB) 0.5-2.5 (3) MG/3ML SOLN Take 3 mLs by nebulization every 4 (four) hours as needed. 06/22/21   Yevonne Pax, MD  losartan Lynelle Smoke)  25 MG tablet Take 1 tablet (25 mg total) by mouth daily. 09/07/22 09/07/23  Laurier Nancy, MD  metoprolol succinate (TOPROL-XL) 50 MG 24 hr tablet Take 50 mg by mouth every morning. Take with or immediately following a meal.    [provider]  Multiple Vitamins-Minerals (PRESERVISION AREDS) CAPS Take by mouth.    [provider]  OXYGEN Inhale 3 L into the lungs See admin instructions. Used as needed throughout the day and continuous at bedtime    [provider]  pantoprazole (PROTONIX) 40 MG tablet Take 40 mg by mouth every morning.    [provider]  Simethicone (GAS-X PO) Take 1 tablet by mouth 2 (two) times daily as needed (flatulence).    [provider]  sodium chloride (OCEAN) 0.65 % SOLN nasal spray Place 1 spray into both nostrils as needed for congestion. 07/11/16   Ezequiel Essex, NP  SUPER B COMPLEX/C PO Take 1 tablet by mouth daily.    [provider]  tamsulosin (FLOMAX) 0.4 MG CAPS capsule TAKE 1 CAPSULE EVERY DAY 07/13/22   Miki Kins, FNP  torsemide (DEMADEX) 10 MG tablet Take 1 tablet (10 mg total) by mouth daily. 09/08/22 09/08/23  Laurier Nancy, MD   Vitals:   02/05/23 0130 02/05/23 0200 02/05/23 0230 02/05/23 0400  BP: (!) 148/56 (!) 167/62 (!) 181/62   Pulse: 68 67 70 77  Resp:  17 18 19   Temp:      TempSrc:      SpO2: 98% 98% 98% 90%  Weight:      Height:       Physical Exam Vitals and nursing note reviewed.  Constitutional:       General: He is not in acute distress. HENT:     Head: Normocephalic and atraumatic.     Right Ear: Hearing normal.     Left Ear: Hearing normal.     Nose: Nose normal. No nasal deformity.     Mouth/Throat:     Lips: Pink.     Tongue: No lesions.     Pharynx: Oropharynx is clear.  Eyes:     General: Lids are normal.     Extraocular Movements: Extraocular movements intact.  Cardiovascular:     Rate and Rhythm: Normal rate and regular rhythm.     Heart sounds: Normal heart sounds.  Pulmonary:     Effort: Pulmonary effort is normal.     Breath sounds: Examination of the right-lower field reveals rales. Examination of the left-lower field reveals rales. Rales present.  Abdominal:     General: Abdomen is protuberant. Bowel sounds are normal. There is distension.     Palpations: There is no mass.     Tenderness: There is no abdominal tenderness.     Hernia: No hernia is present.  Musculoskeletal:     Right lower leg: No edema.     Left lower leg: No edema.  Skin:    General: Skin is warm.  Neurological:     General: No focal deficit present.     Mental Status: He is alert and oriented to person, place, and time.     Cranial Nerves: Cranial nerves 2-12 are intact.  Psychiatric:        Attention and Perception: Attention normal.        Mood and Affect: Mood normal.        Speech: Speech normal.        Behavior: Behavior normal. Behavior is cooperative.  Labs on Admission: I have personally reviewed following labs and imaging studies  CBC: Recent Labs  Lab 01/30/23 1310 02/04/23 1625 02/04/23 2123  WBC 6.2 10.0 5.9  HGB 10.5* 12.3* 10.9*  HCT 31.9* 37.8* 32.8*  MCV 98.2 98.2 97.6  PLT 172 221 167   Basic Metabolic Panel: Recent Labs  Lab 02/04/23 1625 02/04/23 2123  NA 134*  --   K 3.8  --   CL 103  --   CO2 15*  --   GLUCOSE 109*  --   BUN 85*  --   CREATININE 5.01* 4.77*  CALCIUM 8.4*  --   MG 1.5*  --    GFR: Estimated Creatinine Clearance: 12.7  mL/min (A) (by C-G formula based on SCr of 4.77 mg/dL (H)). Liver Function Tests: Recent Labs  Lab 02/04/23 1625  AST 15  ALT 10  ALKPHOS 78  BILITOT 0.7  PROT 8.4*  ALBUMIN 4.0   Recent Labs  Lab 02/04/23 1625  LIPASE 56*   No results for input(s): "AMMONIA" in the last 168 hours. Coagulation Profile: No results for input(s): "INR", "PROTIME" in the last 168 hours. Cardiac Enzymes: No results for input(s): "CKTOTAL", "CKMB", "CKMBINDEX", "TROPONINI" in the last 168 hours. BNP (last 3 results) No results for input(s): "PROBNP" in the last 8760 hours. HbA1C: No results for input(s): "HGBA1C" in the last 72 hours. CBG: Recent Labs  Lab 02/04/23 1852  GLUCAP 104*   Lipid Profile: No results for input(s): "CHOL", "HDL", "LDLCALC", "TRIG", "CHOLHDL", "LDLDIRECT" in the last 72 hours. Thyroid Function Tests: No results for input(s): "TSH", "T4TOTAL", "FREET4", "T3FREE", "THYROIDAB" in the last 72 hours. Anemia Panel: No results for input(s): "VITAMINB12", "FOLATE", "FERRITIN", "TIBC", "IRON", "RETICCTPCT" in the last 72 hours. Urinalysis    Component Value Date/Time   COLORURINE YELLOW (A) 07/26/2020 1333   APPEARANCEUR CLOUDY (A) 07/26/2020 1333   APPEARANCEUR Clear 07/27/2014 1247   LABSPEC 1.015 07/26/2020 1333   LABSPEC 1.016 07/27/2014 1247   PHURINE 5.0 07/26/2020 1333   GLUCOSEU NEGATIVE 07/26/2020 1333   GLUCOSEU 50 mg/dL 62/95/2841 3244   HGBUR NEGATIVE 07/26/2020 1333   BILIRUBINUR NEGATIVE 07/26/2020 1333   BILIRUBINUR Negative 07/27/2014 1247   KETONESUR NEGATIVE 07/26/2020 1333   PROTEINUR 100 (A) 07/26/2020 1333   NITRITE NEGATIVE 07/26/2020 1333   LEUKOCYTESUR NEGATIVE 07/26/2020 1333   LEUKOCYTESUR Negative 07/27/2014 1247   Unresulted Labs (From admission, onward)     Start     Ordered   02/05/23 0500  Comprehensive metabolic panel  Tomorrow morning,   R        02/04/23 2048   02/05/23 0500  CBC  Tomorrow morning,   R        02/04/23 2048    02/04/23 2035  Stool culture  Once,   URGENT        02/04/23 2034   02/04/23 2035  H. pylori antigen, stool  Once,   URGENT        02/04/23 2034   02/04/23 1622  Urinalysis, Routine w reflex microscopic -Urine, Clean Catch  Once,   URGENT       Question:  Specimen Source  Answer:  Urine, Clean Catch   02/04/23 1623   Unscheduled  Occult blood card to lab, stool RN will collect  As needed,   TIMED     Question:  Specimen to be collected by:  Answer:  RN will collect   02/04/23 2218  Medications  aspirin EC tablet 81 mg (has no administration in time range)  citalopram (CELEXA) tablet 40 mg (has no administration in time range)  tamsulosin (FLOMAX) capsule 0.4 mg (has no administration in time range)  metoprolol succinate (TOPROL-XL) 24 hr tablet 50 mg (has no administration in time range)  febuxostat (ULORIC) tablet 40 mg (has no administration in time range)  ipratropium-albuterol (DUONEB) 0.5-2.5 (3) MG/3ML nebulizer solution 3 mL (has no administration in time range)  heparin injection 5,000 Units (5,000 Units Subcutaneous Given 02/04/23 2119)  sodium chloride flush (NS) 0.9 % injection 3 mL (3 mLs Intravenous Given 02/04/23 2156)  acetaminophen (TYLENOL) tablet 650 mg (has no administration in time range)    Or  acetaminophen (TYLENOL) suppository 650 mg (has no administration in time range)  HYDROcodone-acetaminophen (NORCO/VICODIN) 5-325 MG per tablet 1 tablet (has no administration in time range)  morphine (PF) 2 MG/ML injection 2 mg (has no administration in time range)  0.9 %  sodium chloride infusion ( Intravenous New Bag/Given 02/04/23 2122)  pantoprazole (PROTONIX) injection 40 mg (40 mg Intravenous Given 02/04/23 2140)  acidophilus (RISAQUAD) capsule 2 capsule (2 capsules Oral Given 02/04/23 2119)  sodium bicarbonate tablet 650 mg (650 mg Oral Given 02/04/23 2119)  loperamide (IMODIUM) capsule 2 mg (has no administration in time range)  insulin aspart (novoLOG)  injection 0-15 Units (has no administration in time range)  lactated ringers bolus 1,000 mL (0 mLs Intravenous Stopped 02/04/23 2010)  ondansetron (ZOFRAN) injection 4 mg (4 mg Intravenous Given 02/04/23 1914)  magnesium sulfate IVPB 2 g 50 mL (0 g Intravenous Stopped 02/04/23 2015)  simethicone (MYLICON) chewable tablet 80 mg (80 mg Oral Given 02/04/23 2156)   Radiological Exams on Admission: CT ABDOMEN PELVIS WO CONTRAST  Result Date: 02/04/2023 CLINICAL DATA:  Abdominal pain, acute, nonlocalized EXAM: CT ABDOMEN AND PELVIS WITHOUT CONTRAST TECHNIQUE: Multidetector CT imaging of the abdomen and pelvis was performed following the standard protocol without IV contrast. RADIATION DOSE REDUCTION: This exam was performed according to the departmental dose-optimization program which includes automated exposure control, adjustment of the mA and/or kV according to patient size and/or use of iterative reconstruction technique. COMPARISON:  07/25/2012 FINDINGS: Lower chest: Scarring in the lung bases. Coronary artery and aortic atherosclerosis. Hepatobiliary: No focal hepatic abnormality. Gallbladder unremarkable. Pancreas: No focal abnormality or ductal dilatation. Spleen: No focal abnormality.  Normal size. Adrenals/Urinary Tract: Renal atrophy and cortical thinning. Numerous bilateral cysts, the largest in the right upper pole measuring 3.3 cm. No follow-up imaging recommended. 6 mm stone in the upper pole of the left kidney. No ureteral stones or hydronephrosis bilaterally. Adrenal glands and urinary bladder unremarkable. Stomach/Bowel: Normal appendix. Left colonic diverticulosis. No active diverticulitis. Stomach and small bowel decompressed. No bowel obstruction. Vascular/Lymphatic: Aortoiliac atherosclerosis. No evidence of aneurysm or adenopathy. Reproductive: Mildly prominent prostate. Other: No free fluid or free air. Musculoskeletal: No acute bony abnormality. IMPRESSION: No acute findings in the  abdomen or pelvis. Left colonic diverticulosis. Left upper pole nephrolithiasis.  No hydronephrosis. Coronary artery disease, aortic atherosclerosis. Electronically Signed   By: Charlett Nose M.D.   On: 02/04/2023 19:59     Data Reviewed: Relevant notes from primary care and specialist visits, past discharge summaries as available in EHR, including Care Everywhere. Prior diagnostic testing as pertinent to current admission diagnoses Updated medications and problem lists for reconciliation ED course, including vitals, labs, imaging, treatment and response to treatment Triage notes, nursing and pharmacy notes and ED provider's notes Notable results as  noted in HPI  Assessment and Plan: * Abdominal pain Pt presenting with abdominal  pain/ distension and N/V diarrhea. Suspect viral GE, with UGI presentation d/d also include pancreatitis ,microscopic colitis, GI consult as deemed appropriate. CT negative but was without contrast. Less likely medication associated.  We will obtain occult and lactoferrin. RUQ usg 2/2 mildly elevated lipase which could from AKI as well.      Diarrhea PRN imodium q8 x 2 doses. Stool studies /pcr/ occult. Pt's last colonoscopy was in 2015 as he is high risk for anesthesia.    Acute kidney injury superimposed on stage 4 chronic kidney disease (HCC) Lab Results  Component Value Date   CREATININE 4.77 (H) 02/04/2023   CREATININE 5.01 (H) 02/04/2023   CREATININE 3.31 (H) 11/28/2022  Creatinine improving with MIVF and will start sodium bicarbonate tabs to help with acidosis and prevent hyperkalemia.  Renally dose meds. Nephrology consult as needed currently trending down.   Electrolyte abnormality Replace and follow levels.    Type 2 diabetes mellitus with diabetic chronic kidney disease (HCC) Carb consistent diet and glycemic protocol.    Coronary artery disease Stable no c/o chest pain troponin negative.   GERD (gastroesophageal reflux disease) IV  Protonix.   OSA on CPAP CPAP at bedtime.   Essential hypertension Vitals:   02/04/23 1618 02/04/23 1900 02/04/23 2157  BP: 103/62 (!) 165/69 (!) 138/117  Continue metoprolol only.     Chronic respiratory failure with hypoxia (HCC) SpO2: 98 % on 3 L.  Cont same and CPAP for bedtime.   Chronic obstructive pulmonary disease (HCC) PRN duoneb and albuterol.    DVT prophylaxis:  Heparin   Consults:  None   Advance Care Planning:    Code Status: Full Code   Family Communication:  Wife and daughter who is med Geophysicist/field seismologist at bedside.    Disposition Plan:  Home   Severity of Illness: The appropriate patient status for this patient is OBSERVATION. Observation status is judged to be reasonable and necessary in order to provide the required intensity of service to ensure the patient's safety. The patient's presenting symptoms, physical exam findings, and initial radiographic and laboratory data in the context of their medical condition is felt to place them at decreased risk for further clinical deterioration. Furthermore, it is anticipated that the patient will be medically stable for discharge from the hospital within 2 midnights of admission.   Author: Gertha Calkin, MD 02/05/2023 5:08 AM  For on call review [email or web address].

## 2023-02-04 NOTE — Assessment & Plan Note (Signed)
IV Protonix.

## 2023-02-04 NOTE — Assessment & Plan Note (Signed)
Pt presenting with abdominal  pain/ distension and N/V diarrhea. Suspect viral GE, with UGI presentation d/d also include pancreatitis ,microscopic colitis, GI consult as deemed appropriate. CT negative but was without contrast. Less likely medication associated.  We will obtain occult and lactoferrin. RUQ usg 2/2 mildly elevated lipase which could from AKI as well.

## 2023-02-04 NOTE — Assessment & Plan Note (Signed)
Stable no c/o chest pain troponin negative.

## 2023-02-04 NOTE — Assessment & Plan Note (Signed)
>>  ASSESSMENT AND PLAN FOR GERD (GASTROESOPHAGEAL REFLUX DISEASE) WRITTEN ON 02/04/2023 10:22 PM BY PATEL, EKTA V, MD  IV Protonix.

## 2023-02-04 NOTE — ED Provider Notes (Signed)
[facility] Provider Note    Event Date/Time   First MD Initiated Contact with Patient 02/04/23 1821     (approximate)   History   Chief Complaint Abdominal Pain   HPI  [name] [name] is a 77 y.o. male with past medical history of hypertension, diabetes, CKD, CHF, and COPD who presents to the ED complaining of abdominal pain.  Patient reports that he has had 3 days of watery diarrhea, nausea, vomiting, and abdominal pain.  He describes his abdominal pain as diffuse and crampy, has not had any blood in his emesis or stool and denies any fevers.  He states his urine output has been decreased but he denies any dysuria or hematuria.  He is not aware of any sick contacts, denies any recent travel outside of the country.  He has been feeling generally weak and family reports his appetite has been significantly decreased.     Physical Exam   Triage Vital Signs: ED Triage Vitals  Encounter Vitals Group     BP 02/04/23 1618 103/62     Systolic BP Percentile --      Diastolic BP Percentile --      Pulse Rate 02/04/23 1618 69     Resp 02/04/23 1618 17     Temp 02/04/23 1618 97.8 F (36.6 C)     Temp Source 02/04/23 1618 Oral     SpO2 02/04/23 1618 98 %     Weight 02/04/23 1620 200 lb (90.7 kg)     Height 02/04/23 1620 5\' 3"  (1.6 m)     Head Circumference --      Peak Flow --      Pain Score 02/04/23 1620 0     Pain Loc --      Pain Education --      Exclude from Growth Chart --     Most recent vital signs: Vitals:   02/04/23 1618  BP: 103/62  Pulse: 69  Resp: 17  Temp: 97.8 F (36.6 C)  SpO2: 98%    Constitutional: Alert and oriented. Eyes: Conjunctivae are normal. Head: Atraumatic. Nose: No congestion/rhinnorhea. Mouth/Throat: Mucous membranes are dry. Cardiovascular: Normal rate, regular rhythm. Grossly normal heart sounds.  2+ radial pulses bilaterally. Respiratory: Normal respiratory effort.  No retractions. Lungs  CTAB. Gastrointestinal: Soft and diffusely tender to palpation with no rebound or guarding. No distention. Musculoskeletal: No lower extremity tenderness nor edema.  Neurologic:  Normal speech and language. No gross focal neurologic deficits are appreciated.    ED Results / Procedures / Treatments   Labs (all labs ordered are listed, but only abnormal results are displayed) Labs Reviewed  BASIC METABOLIC PANEL - Abnormal; Notable for the following components:      Result Value   Sodium 134 (*)    CO2 15 (*)    Glucose, Bld 109 (*)    BUN 85 (*)    Creatinine, Ser 5.01 (*)    Calcium 8.4 (*)    GFR, Estimated 11 (*)    Anion gap 16 (*)    All other components within normal limits  CBC - Abnormal; Notable for the following components:   RBC 3.85 (*)    Hemoglobin 12.3 (*)    HCT 37.8 (*)    All other components within normal limits  HEPATIC FUNCTION PANEL - Abnormal; Notable for the following components:   Total Protein 8.4 (*)    All other components within normal limits  LIPASE, BLOOD - Abnormal;  Notable for the following components:   Lipase 56 (*)    All other components within normal limits  MAGNESIUM - Abnormal; Notable for the following components:   Magnesium 1.5 (*)    All other components within normal limits  CBG MONITORING, ED - Abnormal; Notable for the following components:   Glucose-Capillary 104 (*)    All other components within normal limits  C DIFFICILE QUICK SCREEN W PCR REFLEX    GASTROINTESTINAL PANEL BY PCR, STOOL (REPLACES STOOL CULTURE)  BRAIN NATRIURETIC PEPTIDE  URINALYSIS, ROUTINE W REFLEX MICROSCOPIC  TROPONIN I (HIGH SENSITIVITY)     EKG  ED ECG REPORT I, Chesley Noon, the attending physician, personally viewed and interpreted this ECG.   Date: 02/04/2023  EKG Time: 16:25  Rate: 68  Rhythm: normal sinus rhythm  Axis: Normal  Intervals: Prolonged QT  ST&T Change: None  RADIOLOGY CT abdomen/pelvis reviewed and interpreted by me  with no inflammatory changes, focal fluid collections, or dilated bowel loops.  PROCEDURES:  Critical Care performed: Yes, see critical care procedure note(s)  .Critical Care  Performed by: Chesley Noon, MD Authorized by: Chesley Noon, MD   Critical care provider statement:    Critical care time (minutes):  30   Critical care time was exclusive of:  Separately billable procedures and treating other patients and teaching time   Critical care was necessary to treat or prevent imminent or life-threatening deterioration of the following conditions:  Renal failure   Critical care was time spent personally by me on the following activities:  Development of treatment plan with patient or surrogate, discussions with consultants, evaluation of patient's response to treatment, examination of patient, ordering and review of laboratory studies, ordering and review of radiographic studies, ordering and performing treatments and interventions, pulse oximetry, re-evaluation of patient's condition and review of old charts   I assumed direction of critical care for this patient from another provider in my specialty: no     Care discussed with: admitting provider      MEDICATIONS ORDERED IN ED: Medications  lactated ringers bolus 1,000 mL (1,000 mLs Intravenous New Bag/Given 02/04/23 1850)  ondansetron (ZOFRAN) injection 4 mg (4 mg Intravenous Given 02/04/23 1914)  magnesium sulfate IVPB 2 g 50 mL (2 g Intravenous New Bag/Given 02/04/23 1915)     IMPRESSION / MDM / ASSESSMENT AND PLAN / ED COURSE  I reviewed the triage vital signs and the nursing notes.                              77 y.o. male with past medical history of hypertension, diabetes, CKD, CHF, and COPD who presents to the ED with diffuse abdominal pain, vomiting, diarrhea, and generalized weakness over the past 3 days.  Patient's presentation is most consistent with acute presentation with potential threat to life or bodily  function.  Differential diagnosis includes, but is not limited to, gastroenteritis, diverticulitis, colitis, UTI, kidney stone, appendicitis, dehydration, electrolyte abnormality, AKI.  Patient chronically ill but nontoxic-appearing and in no acute distress, vital signs are unremarkable.  His abdomen is soft but diffusely tender to palpation, he also appears clinically dehydrated.  Labs without significant anemia or leukocytosis, he does have AKI with associated acidosis, potassium within normal limits but he does have hypomagnesemia which we will replete.  LFTs and lipase are unremarkable, troponin and BNP also reassuring.  We will further assess with CT imaging of his abdomen/pelvis although suspect gastroenteritis.  Stool studies are pending at this time, will treat with IV Zofran and hydrate with IV fluids.  CT abdomen/pelvis is negative for acute process, C. difficile testing is negative.  Case discussed with hospitalist for admission given significant AKI and acidosis.      FINAL CLINICAL IMPRESSION(S) / ED DIAGNOSES   Final diagnoses:  Gastroenteritis  AKI (acute kidney injury) (HCC)  Hypomagnesemia     Rx / DC Orders   ED Discharge Orders     None        Note:  This document was prepared using Dragon voice recognition software and may include unintentional dictation errors.   Chesley Noon, MD 02/04/23 2023

## 2023-02-05 ENCOUNTER — Observation Stay: Payer: Medicare HMO

## 2023-02-05 ENCOUNTER — Encounter: Payer: Self-pay | Admitting: Internal Medicine

## 2023-02-05 ENCOUNTER — Ambulatory Visit: Payer: Medicare HMO | Admitting: Family

## 2023-02-05 DIAGNOSIS — K828 Other specified diseases of gallbladder: Secondary | ICD-10-CM | POA: Diagnosis not present

## 2023-02-05 DIAGNOSIS — R109 Unspecified abdominal pain: Secondary | ICD-10-CM | POA: Diagnosis not present

## 2023-02-05 DIAGNOSIS — K529 Noninfective gastroenteritis and colitis, unspecified: Principal | ICD-10-CM

## 2023-02-05 DIAGNOSIS — N179 Acute kidney failure, unspecified: Secondary | ICD-10-CM

## 2023-02-05 DIAGNOSIS — R748 Abnormal levels of other serum enzymes: Secondary | ICD-10-CM | POA: Diagnosis not present

## 2023-02-05 HISTORY — DX: Hypomagnesemia: E83.42

## 2023-02-05 LAB — URINALYSIS, ROUTINE W REFLEX MICROSCOPIC
Bacteria, UA: NONE SEEN
Bilirubin Urine: NEGATIVE
Glucose, UA: NEGATIVE mg/dL
Hgb urine dipstick: NEGATIVE
Ketones, ur: NEGATIVE mg/dL
Leukocytes,Ua: NEGATIVE
Nitrite: NEGATIVE
Protein, ur: 100 mg/dL — AB
Specific Gravity, Urine: 1.01 (ref 1.005–1.030)
Squamous Epithelial / HPF: 0 /[HPF] (ref 0–5)
pH: 5 (ref 5.0–8.0)

## 2023-02-05 LAB — COMPREHENSIVE METABOLIC PANEL
ALT: 7 U/L (ref 0–44)
AST: 11 U/L — ABNORMAL LOW (ref 15–41)
Albumin: 3.4 g/dL — ABNORMAL LOW (ref 3.5–5.0)
Alkaline Phosphatase: 72 U/L (ref 38–126)
Anion gap: 14 (ref 5–15)
BUN: 83 mg/dL — ABNORMAL HIGH (ref 8–23)
CO2: 18 mmol/L — ABNORMAL LOW (ref 22–32)
Calcium: 8.2 mg/dL — ABNORMAL LOW (ref 8.9–10.3)
Chloride: 107 mmol/L (ref 98–111)
Creatinine, Ser: 4.7 mg/dL — ABNORMAL HIGH (ref 0.61–1.24)
GFR, Estimated: 12 mL/min — ABNORMAL LOW (ref >60)
Glucose, Bld: 97 mg/dL (ref 70–99)
Potassium: 3.5 mmol/L (ref 3.5–5.1)
Sodium: 139 mmol/L (ref 135–145)
Total Bilirubin: 0.5 mg/dL (ref 0.3–1.2)
Total Protein: 7.3 g/dL (ref 6.5–8.1)

## 2023-02-05 LAB — GLUCOSE, CAPILLARY
Glucose-Capillary: 123 mg/dL — ABNORMAL HIGH (ref 70–99)
Glucose-Capillary: 80 mg/dL (ref 70–99)

## 2023-02-05 LAB — CBG MONITORING, ED
Glucose-Capillary: 105 mg/dL — ABNORMAL HIGH (ref 70–99)
Glucose-Capillary: 114 mg/dL — ABNORMAL HIGH (ref 70–99)

## 2023-02-05 LAB — CBC
HCT: 32.8 % — ABNORMAL LOW (ref 39.0–52.0)
Hemoglobin: 11 g/dL — ABNORMAL LOW (ref 13.0–17.0)
MCH: 32.7 pg (ref 26.0–34.0)
MCHC: 33.5 g/dL (ref 30.0–36.0)
MCV: 97.6 fL (ref 80.0–100.0)
Platelets: 147 10*3/uL — ABNORMAL LOW (ref 150–400)
RBC: 3.36 MIL/uL — ABNORMAL LOW (ref 4.22–5.81)
RDW: 13.4 % (ref 11.5–15.5)
WBC: 5.6 10*3/uL (ref 4.0–10.5)
nRBC: 0 % (ref 0.0–0.2)

## 2023-02-05 MED ORDER — PROMETHAZINE (PHENERGAN) 6.25MG IN NS 50ML IVPB
6.2500 mg | Freq: Four times a day (QID) | INTRAVENOUS | Status: DC | PRN
  Filled 2023-02-05: qty 50

## 2023-02-05 MED ORDER — ONDANSETRON HCL 4 MG/2ML IJ SOLN
4.0000 mg | Freq: Four times a day (QID) | INTRAMUSCULAR | Status: DC | PRN
  Administered 2023-02-05: 4 mg via INTRAVENOUS
  Filled 2023-02-05 (×2): qty 2

## 2023-02-05 MED ORDER — GABAPENTIN 400 MG PO CAPS
400.0000 mg | ORAL_CAPSULE | Freq: Every day | ORAL | Status: DC
  Administered 2023-02-05: 400 mg via ORAL
  Filled 2023-02-05: qty 1

## 2023-02-05 MED ORDER — EZETIMIBE 10 MG PO TABS
10.0000 mg | ORAL_TABLET | Freq: Every day | ORAL | Status: DC
  Administered 2023-02-06: 10 mg via ORAL
  Filled 2023-02-05: qty 1

## 2023-02-05 MED ORDER — PROMETHAZINE HCL 25 MG/ML IJ SOLN: 6.2500 mg | Freq: Four times a day (QID) | INTRAMUSCULAR | Status: DC | PRN

## 2023-02-05 MED ORDER — GABAPENTIN 100 MG PO CAPS
200.0000 mg | ORAL_CAPSULE | ORAL | Status: DC
  Administered 2023-02-06: 200 mg via ORAL
  Filled 2023-02-05: qty 2

## 2023-02-05 MED ORDER — SODIUM CHLORIDE 0.9 % IV SOLN
12.5000 mg | Freq: Four times a day (QID) | INTRAVENOUS | Status: DC | PRN
  Administered 2023-02-05: 12.5 mg via INTRAVENOUS
  Filled 2023-02-05: qty 12.5

## 2023-02-05 NOTE — Plan of Care (Signed)
  Problem: Education: Goal: Ability to describe self-care measures that may prevent or decrease complications (Diabetes Survival Skills Education) will improve Outcome: Progressing Goal: Individualized Educational Video(s) Outcome: Progressing   Problem: Coping: Goal: Ability to adjust to condition or change in health will improve Outcome: Progressing   Problem: Fluid Volume: Goal: Ability to maintain a balanced intake and output will improve Outcome: Progressing   Problem: Health Behavior/Discharge Planning: Goal: Ability to identify and utilize available resources and services will improve Outcome: Progressing Goal: Ability to manage health-related needs will improve Outcome: Progressing   Problem: Metabolic: Goal: Ability to maintain appropriate glucose levels will improve Outcome: Progressing   Problem: Nutritional: Goal: Maintenance of adequate nutrition will improve Outcome: Progressing Goal: Progress toward achieving an optimal weight will improve Outcome: Progressing   Problem: Skin Integrity: Goal: Risk for impaired skin integrity will decrease Outcome: Progressing   Problem: Tissue Perfusion: Goal: Adequacy of tissue perfusion will improve Outcome: Progressing   Problem: Education: Goal: Knowledge of General Education information will improve Description: Including pain rating scale, medication(s)/side effects and non-pharmacologic comfort measures Outcome: Progressing   Problem: Health Behavior/Discharge Planning: Goal: Ability to manage health-related needs will improve Outcome: Progressing   Problem: Clinical Measurements: Goal: Ability to maintain clinical measurements within normal limits will improve Outcome: Progressing Goal: Will remain free from infection Outcome: Progressing Goal: Diagnostic test results will improve Outcome: Progressing Goal: Respiratory complications will improve Outcome: Progressing Goal: Cardiovascular complication will  be avoided Outcome: Progressing   Problem: Activity: Goal: Risk for activity intolerance will decrease Outcome: Progressing   Problem: Nutrition: Goal: Adequate nutrition will be maintained Outcome: Progressing   Problem: Elimination: Goal: Will not experience complications related to bowel motility Outcome: Progressing Goal: Will not experience complications related to urinary retention Outcome: Progressing   Problem: Pain Managment: Goal: General experience of comfort will improve Outcome: Progressing   Problem: Skin Integrity: Goal: Risk for impaired skin integrity will decrease Outcome: Progressing   Problem: Safety: Goal: Ability to remain free from injury will improve Outcome: Progressing

## 2023-02-05 NOTE — Progress Notes (Signed)
PROGRESS NOTE  [name] [name]    DOB: Jan 28, 1946, 77 y.o.  ZOX:[id number]    Code Status: Full Code   DOA: 02/04/2023   LOS: 0   Brief hospital course  [name] [name] is a 77 y.o. male with medical history significant for history of stroke, history of AKI, heart disease, hypertension, COPD, diabetes mellitus type 2, hypothyroidism, nephrolithiasis, aortic valve replaced status post TAVR, tobacco dependence.  He presented with abdominal pain, nausea and diarrhea x several days.  In the emergency room patient is alert awake oriented with stable vital signs, afebrile at 97.8 heart rate of 69 respirations of 17 and blood pressure of 103/62 O2 sats 98%.   Labs: Initial glucose of 104, Na+ 134 bicarb of 15 glucose 109 creatinine of 5.01 gap of 16, magnesium 1.5, lipase 56, total protein 8.4. CBC shows normal white count of 10 hemoglobin of 12.3 and normal platelet count of 221.  C. difficile testing is negative. Urinalysis not significant infection present EKG: sinus rhythm at 68 PR interval 188, QRS of 92, QTc prolonged at 497, LVH criteria with a tall R wave in aVL.  No ST T wave inversions otherwise.   CT of the abdomen pelvis: negative for any acute findings in the abdomen or pelvis. Positive diverticulosis. L upper pole nephrolithiasis without hydronephrosis. RUQ [place]: Distended gallbladder. No stones. No biliary ductal dilatatio   They were initially treated with acidophilus, zofran, simethicone, IV fluids, protonix.   Patient was admitted to medicine service for further workup and management of gastroenteritis as outlined in detail below.  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 GI panel and Cdiff negative. Symptoms have greatly improved but continues to have nausea and vomiting this morning.  - supportive care PRN - encourage return to regular diet and good PO hydration  - PT/OT for physical deconditioning in acute setting  AKI on CKD4- pre-renal in setting of dehydration. Cr 4.7 from baseline around 3.3 - oral hydration - BMP am  COPD- on 3L baseline. Stable on 1.5L without respiratory complaints - continue home oxygen - breathing treatments PRN  Type II DM- not insulin dependent  - monitor blood glucose  HTN (poorly controlled)  diastolic HFpEF-  - continue home metoprolol   Depression- chronic - continue home medications  Body mass index is 35.43 kg/m.  VTE ppx: heparin injection 5,000 Units Start: 02/04/23 2200  Diet:     Diet   Diet Carb Modified Fluid consistency: Thin; Room service appropriate? Yes   Consultants: None   Subjective 02/05/23    Pt reports feeling much better. Denies bowel incontinence, abdominal pain, vomiting since admission.    Objective   Vitals:   02/05/23 0230 02/05/23 0400 02/05/23 0511 02/05/23 0600  BP: (!) 181/62   (!) 173/66  Pulse: 70 77  73  Resp: 18 19  17   Temp:   98.1 F (36.7 C)   TempSrc:   Oral   SpO2: 98% 90%  96%  Weight:      Height:  Intake/Output Summary (Last 24 hours) at 02/05/2023 0803 Last data filed at 02/04/2023 2015 Gross per 24 hour  Intake 1050 ml  Output --  Net 1050 ml   Filed Weights   02/04/23 1620  Weight: 90.7 kg     Physical Exam:  General: awake, alert, NAD. obese HEENT: atraumatic, clear conjunctiva, anicteric sclera, MMM, hearing grossly normal Respiratory: normal respiratory effort. Cardiovascular: quick capillary refill, normal S1/S2, RRR, no JVD,  murmurs Gastrointestinal: soft, NT, ND Nervous: A&O x3. no gross focal neurologic deficits, normal speech Extremities: moves all equally, no edema, normal tone Skin: dry, intact, normal temperature, normal color. No rashes, lesions or ulcers on exposed skin Psychiatry: normal mood, congruent affect  Labs   I have personally reviewed the following labs and imaging studies CBC    Component Value Date/Time   WBC 5.6 02/05/2023 0424   RBC 3.36 (L) 02/05/2023 0424   HGB 11.0 (L) 02/05/2023 0424   HGB 10.7 (L) 11/28/2022 1050   HCT 32.8 (L) 02/05/2023 0424   HCT 32.3 (L) 11/28/2022 1050   PLT 147 (L) 02/05/2023 0424   PLT 125 (L) 11/28/2022 1050   MCV 97.6 02/05/2023 0424   MCV 95 11/28/2022 1050   MCV 96 07/27/2014 1247   MCH 32.7 02/05/2023 0424   MCHC 33.5 02/05/2023 0424   RDW 13.4 02/05/2023 0424   RDW 12.5 11/28/2022 1050   RDW 13.1 07/27/2014 1247   LYMPHSABS 1.5 11/28/2022 1050   LYMPHSABS 1.8 07/27/2014 1247   MONOABS 0.6 10/23/2022 1253   MONOABS 0.6 07/27/2014 1247   EOSABS 0.0 11/28/2022 1050   EOSABS 0.0 07/27/2014 1247   BASOSABS 0.0 11/28/2022 1050   BASOSABS 0.0 07/27/2014 1247   BASOSABS 0 02/03/2012 0318      Latest Ref Rng & Units 02/05/2023    4:24 AM 02/04/2023    9:23 PM 02/04/2023    4:25 PM  BMP  Glucose 70 - 99 mg/dL 97   366   BUN 8 - 23 mg/dL 83   85   Creatinine 4.40 - 1.24 mg/dL 3.47  4.25  9.56   Sodium 135 - 145 mmol/L 139   134   Potassium 3.5 - 5.1 mmol/L 3.5   3.8   Chloride 98 - 111 mmol/L 107   103   CO2 22 - 32 mmol/L 18   15   Calcium 8.9 - 10.3 mg/dL 8.2   8.4     CT ABDOMEN PELVIS WO CONTRAST  Result Date: 02/04/2023 CLINICAL DATA:  Abdominal pain, acute, nonlocalized EXAM: CT ABDOMEN AND PELVIS WITHOUT CONTRAST TECHNIQUE: Multidetector CT imaging of the abdomen and pelvis was performed following the standard protocol without IV contrast. RADIATION DOSE REDUCTION: This exam was performed according to the departmental  dose-optimization program which includes automated exposure control, adjustment of the mA and/or kV according to patient size and/or use of iterative reconstruction technique. COMPARISON:  07/25/2012 FINDINGS: Lower chest: Scarring in the lung bases. Coronary artery and aortic atherosclerosis. Hepatobiliary: No focal hepatic abnormality. Gallbladder unremarkable. Pancreas: No focal abnormality or ductal dilatation. Spleen: No focal abnormality.  Normal size. Adrenals/Urinary Tract: Renal atrophy and cortical thinning. Numerous bilateral cysts, the largest in the right upper pole measuring 3.3 cm. No follow-up imaging recommended. 6 mm stone in the upper pole of the left kidney. No ureteral stones or hydronephrosis bilaterally. Adrenal glands and urinary bladder unremarkable. Stomach/Bowel: Normal appendix. Left colonic diverticulosis. No active diverticulitis. Stomach and small bowel decompressed. No bowel obstruction. Vascular/Lymphatic: Aortoiliac atherosclerosis.  No evidence of aneurysm or adenopathy. Reproductive: Mildly prominent prostate. Other: No free fluid or free air. Musculoskeletal: No acute bony abnormality. IMPRESSION: No acute findings in the abdomen or pelvis. Left colonic diverticulosis. Left upper pole nephrolithiasis.  No hydronephrosis. Coronary artery disease, aortic atherosclerosis. Electronically Signed   By: Charlett Nose M.D.   On: 02/04/2023 19:59    Disposition Plan & Communication  Patient status: Observation  Admitted From: Home Planned disposition location: Home Anticipated discharge date: 10/15 pending clinical improvement   Family Communication: wife at bedside    Author: Leeroy Bock, DO [facility] 02/05/2023, 8:03 AM   Available by Epic secure chat 7AM-7PM. If 7PM-7AM, please contact night-coverage.  [facility] contact information found on [email or web address].

## 2023-02-06 DIAGNOSIS — N179 Acute kidney failure, unspecified: Secondary | ICD-10-CM | POA: Diagnosis not present

## 2023-02-06 DIAGNOSIS — K529 Noninfective gastroenteritis and colitis, unspecified: Secondary | ICD-10-CM | POA: Diagnosis not present

## 2023-02-06 LAB — BASIC METABOLIC PANEL
Anion gap: 12 (ref 5–15)
BUN: 76 mg/dL — ABNORMAL HIGH (ref 8–23)
CO2: 18 mmol/L — ABNORMAL LOW (ref 22–32)
Calcium: 8.1 mg/dL — ABNORMAL LOW (ref 8.9–10.3)
Chloride: 110 mmol/L (ref 98–111)
Creatinine, Ser: 4.4 mg/dL — ABNORMAL HIGH (ref 0.61–1.24)
GFR, Estimated: 13 mL/min — ABNORMAL LOW (ref >60)
Glucose, Bld: 94 mg/dL (ref 70–99)
Potassium: 3.3 mmol/L — ABNORMAL LOW (ref 3.5–5.1)
Sodium: 140 mmol/L (ref 135–145)

## 2023-02-06 LAB — GLUCOSE, CAPILLARY
Glucose-Capillary: 102 mg/dL — ABNORMAL HIGH (ref 70–99)
Glucose-Capillary: 112 mg/dL — ABNORMAL HIGH (ref 70–99)

## 2023-02-06 MED ORDER — POTASSIUM CHLORIDE CRYS ER 20 MEQ PO TBCR
40.0000 meq | EXTENDED_RELEASE_TABLET | Freq: Two times a day (BID) | ORAL | Status: DC
  Administered 2023-02-06: 40 meq via ORAL
  Filled 2023-02-06: qty 2

## 2023-02-06 NOTE — Care Management Obs Status (Signed)
MEDICARE OBSERVATION STATUS NOTIFICATION   Patient Details  Name: Tymar Polyak MRN: [id number] Date of Birth: 17-May-1945   Medicare Observation Status Notification Given:  Yes    Chapman Fitch, RN 02/06/2023, 11:23 AM

## 2023-02-06 NOTE — Discharge Summary (Signed)
Physician Discharge Summary  Patient: [name] [name] OZH:[id number] DOB: 04/14/1946   Code Status: Full Code Admit date: 02/04/2023 Discharge date: 02/06/2023 Disposition: Home, No home health services recommended PCP: Miki Kins, FNP  Recommendations for Outpatient Follow-up:  Follow up with PCP within 1-2 weeks Regarding general hospital follow up and preventative care  Discharge Diagnoses:  Principal Problem:   Abdominal pain Active Problems:   Diarrhea   Acute kidney injury superimposed on stage 4 chronic kidney disease (HCC)   Chronic obstructive pulmonary disease (HCC)   Chronic respiratory failure with hypoxia (HCC)   Essential hypertension   OSA on CPAP   GERD (gastroesophageal reflux disease)   Coronary artery disease   Type 2 diabetes mellitus with diabetic chronic kidney disease (HCC)   Electrolyte abnormality   Gastroenteritis   Hypomagnesemia   AKI (acute kidney injury) [facility])  Brief Hospital Course Summary: [name] [name] is a 77 y.o. male with medical history significant for history of stroke, history of AKI, heart disease, hypertension, COPD, diabetes mellitus type 2, hypothyroidism, nephrolithiasis, aortic valve replaced status post TAVR, tobacco dependence.   He presented with abdominal pain, nausea and diarrhea x several days.   In the emergency room patient is alert awake oriented with stable vital signs, afebrile at 97.8 heart rate of 69 respirations of 17 and blood pressure of 103/62 O2 sats 98%.   Labs: Initial glucose of 104, Na+ 134 bicarb of 15 glucose 109 creatinine of 5.01 gap of 16, magnesium 1.5, lipase 56, total protein 8.4. CBC shows normal white count of 10 hemoglobin of 12.3 and normal platelet count of 221.  C. difficile testing is negative. Urinalysis not significant infection present EKG: sinus rhythm at 68 PR interval 188, QRS of 92, QTc prolonged at 497, LVH criteria with a tall R wave in aVL.  No ST T wave  inversions otherwise.   CT of the abdomen pelvis: negative for any acute findings in the abdomen or pelvis. Positive diverticulosis. L upper pole nephrolithiasis without hydronephrosis. RUQ [place]: Distended gallbladder. No stones. No biliary ductal dilatatio    They were initially treated with acidophilus, zofran, simethicone, IV fluids, protonix.    Patient was admitted to medicine service for further workup and management of gastroenteritis as outlined in detail below.   His symptoms quickly resolved with supportive care. He was afebrile, normal WBC count, negative GI panel. On day of discharge, he was tolerating a normal diet without nausea or vomiting or abdominal pain. He had a soft-formed BM yesterday evening. He had no respiratory symptoms and was on his home oxygen saturation.   All other chronic conditions were treated with home medications.   Discharge Condition: Good, improved Recommended discharge diet: Regular healthy diet  Consultations: None   Procedures/Studies: None   Allergies as of 02/06/2023       Reactions   Nsaids Other (See Comments)   Avoid due to kidney disease    Statins Other (See Comments)   Myalgias   Nexletol [bempedoic Acid] Diarrhea   fatigue        Medication List     STOP taking these medications    clindamycin 1 % lotion Commonly known as: Cleocin-T   fluticasone 50 MCG/ACT nasal spray Commonly known as: FLONASE   guaiFENesin AC 100-10 MG/5ML syrup Generic drug: guaiFENesin-codeine   hydrocortisone 2.5 % cream   losartan 25 MG tablet Commonly known as: Cozaar   torsemide 10 MG tablet Commonly known as: DEMADEX  torsemide 20 MG tablet Commonly known as: DEMADEX       TAKE these medications    acetaminophen 650 MG CR tablet Commonly known as: TYLENOL Take 1,300 mg by mouth every 8 (eight) hours as needed for pain.   albuterol 108 (90 Base) MCG/ACT inhaler Commonly known as: VENTOLIN HFA Inhale 2 puffs into the lungs  every 6 (six) hours as needed for wheezing or shortness of breath.   aspirin EC 81 MG tablet Take 1 tablet (81 mg total) by mouth daily.   busPIRone 5 MG tablet Commonly known as: BUSPAR TAKE 1 TABLET BY MOUTH TWICE DAILY AS NEEDED   calcitRIOL 0.25 MCG capsule Commonly known as: ROCALTROL Take 0.25 mcg by mouth daily.   citalopram 40 MG tablet Commonly known as: CELEXA TAKE 1 TABLET EVERY DAY   DropSafe Alcohol Prep 70 % Pads Apply 1 each topically as needed (With repatha and blood sugar checks.).   ezetimibe 10 MG tablet Commonly known as: ZETIA TAKE 1 TABLET(10 MG) BY MOUTH AT BEDTIME   febuxostat 40 MG tablet Commonly known as: ULORIC Take 40 mg by mouth daily.   fexofenadine 180 MG tablet Commonly known as: ALLEGRA Take 180 mg by mouth daily as needed for allergies or rhinitis.   gabapentin 100 MG capsule Commonly known as: NEURONTIN Take 200 mg by mouth every morning.   gabapentin 400 MG capsule Commonly known as: NEURONTIN TAKE 1 CAPSULE AT BEDTIME   GAS-X PO Take 1 tablet by mouth 2 (two) times daily as needed (flatulence).   ipratropium-albuterol 0.5-2.5 (3) MG/3ML Soln Commonly known as: DUONEB Take 3 mLs by nebulization every 4 (four) hours as needed.   metoprolol succinate 50 MG 24 hr tablet Commonly known as: TOPROL-XL Take 50 mg by mouth every morning. Take with or immediately following a meal.   OXYGEN Inhale 3 L into the lungs See admin instructions. Used as needed throughout the day and continuous at bedtime   pantoprazole 40 MG tablet Commonly known as: PROTONIX Take 40 mg by mouth every morning.   PreserVision AREDS Caps Take 1 capsule by mouth 2 (two) times daily.   Repatha SureClick 140 MG/ML Soaj Generic drug: Evolocumab INJECT CONTENT OF 1 CARTRIDGE UNDER THE SKIN EVERY OTHER WEEK   sodium chloride 0.65 % Soln nasal spray Commonly known as: OCEAN Place 1 spray into both nostrils as needed for congestion.   SUPER B COMPLEX/C  PO Take 1 tablet by mouth daily.   tamsulosin 0.4 MG Caps capsule Commonly known as: FLOMAX TAKE 1 CAPSULE EVERY DAY   Trelegy Ellipta 100-62.5-25 MCG/ACT Aepb Generic drug: Fluticasone-Umeclidin-Vilant Inhale 1 Inhalation into the lungs daily at 6 (six) AM. What changed:  when to take this reasons to take this   vitamin C 1000 MG tablet Take 1,000 mg by mouth at bedtime.        Follow-up Information     Miki Kins, FNP. Schedule an appointment as soon as possible for a visit in 1 week(s).   Specialty: Family Medicine Contact information: [address] [place] [address] [phone]                 Subjective   Pt reports no complaints. He feels back to his baseline. Enjoyed breakfast without nausea or vomiting. Denies abdominal pain. No respiratory distress. No more episodes of diarrhea since admission. Had a soft-formed BM yesterday evening. Wants to go home.   All questions and concerns were addressed at time of discharge.  Objective  Blood pressure (!) 160/56, pulse 70, temperature 98.2 F (36.8 C), resp. rate 18, height 5\' 3"  (1.6 m), weight 87 kg, SpO2 97%.   General: Pt is alert, awake, not in acute distress Cardiovascular: RRR, S1/S2 +, no rubs, no gallops Respiratory: CTA bilaterally, no wheezing, no rhonchi Abdominal: Soft, NT, ND, bowel sounds + Extremities: no edema, no cyanosis  The results of significant diagnostics from this hospitalization (including imaging, microbiology, ancillary and laboratory) are listed below for reference.   Imaging studies: US ABDOMEN LIMITED RUQ (LIVER/GB)  Result Date: 02/05/2023 CLINICAL DATA:  Elevated lipase.  Abdominal pain EXAM: ULTRASOUND ABDOMEN LIMITED RIGHT UPPER QUADRANT COMPARISON:  CT 02/04/2023 FINDINGS: Gallbladder: No gallstones or wall thickening visualized. No sonographic Murphy sign noted by sonographer. Common bile duct: Diameter: 3 mm Liver: No focal lesion identified. Within normal  limits in parenchymal echogenicity. Portal vein is patent on color Doppler imaging with normal direction of blood flow towards the liver. Other: None. IMPRESSION: Distended gallbladder.  No stones.  No biliary ductal dilatation Electronically Signed   By: Karen Kays M.D.   On: 02/05/2023 09:46   CT ABDOMEN PELVIS WO CONTRAST  Result Date: 02/04/2023 CLINICAL DATA:  Abdominal pain, acute, nonlocalized EXAM: CT ABDOMEN AND PELVIS WITHOUT CONTRAST TECHNIQUE: Multidetector CT imaging of the abdomen and pelvis was performed following the standard protocol without IV contrast. RADIATION DOSE REDUCTION: This exam was performed according to the departmental dose-optimization program which includes automated exposure control, adjustment of the mA and/or kV according to patient size and/or use of iterative reconstruction technique. COMPARISON:  07/25/2012 FINDINGS: Lower chest: Scarring in the lung bases. Coronary artery and aortic atherosclerosis. Hepatobiliary: No focal hepatic abnormality. Gallbladder unremarkable. Pancreas: No focal abnormality or ductal dilatation. Spleen: No focal abnormality.  Normal size. Adrenals/Urinary Tract: Renal atrophy and cortical thinning. Numerous bilateral cysts, the largest in the right upper pole measuring 3.3 cm. No follow-up imaging recommended. 6 mm stone in the upper pole of the left kidney. No ureteral stones or hydronephrosis bilaterally. Adrenal glands and urinary bladder unremarkable. Stomach/Bowel: Normal appendix. Left colonic diverticulosis. No active diverticulitis. Stomach and small bowel decompressed. No bowel obstruction. Vascular/Lymphatic: Aortoiliac atherosclerosis. No evidence of aneurysm or adenopathy. Reproductive: Mildly prominent prostate. Other: No free fluid or free air. Musculoskeletal: No acute bony abnormality. IMPRESSION: No acute findings in the abdomen or pelvis. Left colonic diverticulosis. Left upper pole nephrolithiasis.  No hydronephrosis.  Coronary artery disease, aortic atherosclerosis. Electronically Signed   By: Charlett Nose M.D.   On: 02/04/2023 19:59    Labs: Basic Metabolic Panel: Recent Labs  Lab 02/04/23 1625 02/04/23 2123 02/05/23 0424 02/06/23 0432  NA 134*  --  139 140  K 3.8  --  3.5 3.3*  CL 103  --  107 110  CO2 15*  --  18* 18*  GLUCOSE 109*  --  97 94  BUN 85*  --  83* 76*  CREATININE 5.01* 4.77* 4.70* 4.40*  CALCIUM 8.4*  --  8.2* 8.1*  MG 1.5*  --   --   --    CBC: Recent Labs  Lab 01/30/23 1310 02/04/23 1625 02/04/23 2123 02/05/23 0424  WBC 6.2 10.0 5.9 5.6  HGB 10.5* 12.3* 10.9* 11.0*  HCT 31.9* 37.8* 32.8* 32.8*  MCV 98.2 98.2 97.6 97.6  PLT 172 221 167 147*   Microbiology: Results for orders placed or performed during the hospital encounter of 02/04/23  Gastrointestinal Panel by PCR , Stool     Status:  None   Collection Time: 02/04/23  7:17 PM   Specimen: Stool  Result Value Ref Range Status   Campylobacter species NOT DETECTED NOT DETECTED Final   Plesimonas shigelloides NOT DETECTED NOT DETECTED Final   Salmonella species NOT DETECTED NOT DETECTED Final   Yersinia enterocolitica NOT DETECTED NOT DETECTED Final   Vibrio species NOT DETECTED NOT DETECTED Final   Vibrio cholerae NOT DETECTED NOT DETECTED Final   Enteroaggregative E coli (EAEC) NOT DETECTED NOT DETECTED Final   Enteropathogenic E coli (EPEC) NOT DETECTED NOT DETECTED Final   Enterotoxigenic E coli (ETEC) NOT DETECTED NOT DETECTED Final   Shiga like toxin producing E coli (STEC) NOT DETECTED NOT DETECTED Final   Shigella/Enteroinvasive E coli (EIEC) NOT DETECTED NOT DETECTED Final   Cryptosporidium NOT DETECTED NOT DETECTED Final   Cyclospora cayetanensis NOT DETECTED NOT DETECTED Final   Entamoeba histolytica NOT DETECTED NOT DETECTED Final   Giardia lamblia NOT DETECTED NOT DETECTED Final   Adenovirus F40/41 NOT DETECTED NOT DETECTED Final   Astrovirus NOT DETECTED NOT DETECTED Final   Norovirus GI/GII NOT  DETECTED NOT DETECTED Final   Rotavirus A NOT DETECTED NOT DETECTED Final   Sapovirus (I, II, IV, and V) NOT DETECTED NOT DETECTED Final    Comment: Performed at [facility], [address] [place], [place] [address]  C Difficile Quick Screen w PCR reflex     Status: None   Collection Time: 02/04/23  7:17 PM   Specimen: Stool  Result Value Ref Range Status   C Diff antigen NEGATIVE NEGATIVE Final   C Diff toxin NEGATIVE NEGATIVE Final   C Diff interpretation No C. difficile detected.  Final    Comment: Performed at [facility], [address], [place], [place] [address]   Time coordinating discharge: Over 30 minutes  Leeroy Bock, MD  [facility] 02/06/2023, 10:45 AM

## 2023-02-06 NOTE — Discharge Instructions (Addendum)
Please make an appointment with your primary doctor or nephrologist within a week to recheck your labs and ensure that your electrolytes and kidney function have fully recovered.  For recovery, please stay hydrated with electrolyte containing fluids such as gatorade. You can take a probiotic supplement or eat probiotic containing foods to help recover your natural microbiome such as greek yogurt, sauerkraut, kimchi, etc

## 2023-02-06 NOTE — TOC CM/SW Note (Signed)
Wife at bedside to transport home.  Does not have his portable o2 tank for discharge. Patient established with [employer].  Notified Mitch with [employer] that portable tank needs to be brought to room for discharge

## 2023-02-06 NOTE — TOC CM/SW Note (Signed)
Transition of Care [facility]) - Inpatient Brief Assessment   Patient Details  Name: [name] [name] MRN: [id number] Date of Birth: 16-Sep-1945  Transition of Care [facility]) CM/SW Contact:    Chapman Fitch, RN Phone Number: 02/06/2023, 10:41 AM   Clinical Narrative:   Transition of Care [facility]) Screening Note   Patient Details  Name: [name] [name] Date of Birth: Jul 06, 1945   Transition of Care [facility]) CM/SW Contact:    Chapman Fitch, RN Phone Number: 02/06/2023, 10:41 AM    Transition of Care Department [facility]) has reviewed patient and no TOC needs have been identified at this time.  If new patient transition needs arise, please place a TOC consult.    Transition of Care Asessment: Insurance and Status: Insurance coverage has been reviewed Patient has primary care physician: Yes     Prior/Current Home Services: No current home services Social Determinants of Health Reivew: SDOH reviewed no interventions necessary Readmission risk has been reviewed: Yes Transition of care needs: no transition of care needs at this time

## 2023-02-06 NOTE — Plan of Care (Signed)
The patient has been discharged. IV has been removed. Education has been completed with the patient and wife.  Problem: Education: Goal: Ability to describe self-care measures that may prevent or decrease complications (Diabetes Survival Skills Education) will improve Outcome: Completed/Met Goal: Individualized Educational Video(s) Outcome: Completed/Met   Problem: Coping: Goal: Ability to adjust to condition or change in health will improve Outcome: Completed/Met   Problem: Fluid Volume: Goal: Ability to maintain a balanced intake and output will improve Outcome: Completed/Met   Problem: Health Behavior/Discharge Planning: Goal: Ability to identify and utilize available resources and services will improve Outcome: Completed/Met Goal: Ability to manage health-related needs will improve Outcome: Completed/Met   Problem: Metabolic: Goal: Ability to maintain appropriate glucose levels will improve Outcome: Completed/Met   Problem: Nutritional: Goal: Maintenance of adequate nutrition will improve Outcome: Completed/Met Goal: Progress toward achieving an optimal weight will improve Outcome: Completed/Met   Problem: Skin Integrity: Goal: Risk for impaired skin integrity will decrease Outcome: Completed/Met   Problem: Tissue Perfusion: Goal: Adequacy of tissue perfusion will improve Outcome: Completed/Met   Problem: Education: Goal: Knowledge of General Education information will improve Description: Including pain rating scale, medication(s)/side effects and non-pharmacologic comfort measures Outcome: Completed/Met   Problem: Health Behavior/Discharge Planning: Goal: Ability to manage health-related needs will improve Outcome: Completed/Met   Problem: Clinical Measurements: Goal: Ability to maintain clinical measurements within normal limits will improve Outcome: Completed/Met Goal: Will remain free from infection Outcome: Completed/Met Goal: Diagnostic test results  will improve Outcome: Completed/Met Goal: Respiratory complications will improve Outcome: Completed/Met Goal: Cardiovascular complication will be avoided Outcome: Completed/Met   Problem: Activity: Goal: Risk for activity intolerance will decrease Outcome: Completed/Met   Problem: Nutrition: Goal: Adequate nutrition will be maintained Outcome: Completed/Met   Problem: Coping: Goal: Level of anxiety will decrease Outcome: Completed/Met   Problem: Elimination: Goal: Will not experience complications related to bowel motility Outcome: Completed/Met Goal: Will not experience complications related to urinary retention Outcome: Completed/Met   Problem: Pain Managment: Goal: General experience of comfort will improve Outcome: Completed/Met   Problem: Safety: Goal: Ability to remain free from injury will improve Outcome: Completed/Met   Problem: Skin Integrity: Goal: Risk for impaired skin integrity will decrease Outcome: Completed/Met

## 2023-02-07 LAB — H. PYLORI ANTIGEN, STOOL: H. Pylori Stool Ag, Eia: NEGATIVE

## 2023-02-09 LAB — STOOL CULTURE: E coli, Shiga toxin Assay: NEGATIVE

## 2023-02-09 LAB — STOOL CULTURE REFLEX - RSASHR

## 2023-02-09 LAB — STOOL CULTURE REFLEX - CMPCXR

## 2023-02-12 ENCOUNTER — Ambulatory Visit: Payer: Medicare HMO | Admitting: Family

## 2023-02-12 VITALS — BP 140/60 | HR 74 | Ht 63.0 in | Wt 202.0 lb

## 2023-02-12 DIAGNOSIS — N184 Chronic kidney disease, stage 4 (severe): Secondary | ICD-10-CM | POA: Diagnosis not present

## 2023-02-12 NOTE — Progress Notes (Signed)
Established Patient Office Visit  Subjective:  Patient ID: [name] [name], male    DOB: May 23, 1945  Age: 77 y.o. MRN: [id number]  Chief Complaint  Patient presents with   Follow-up    Hospital f/u    Patient here for hospital follow up.  He was admitted after going to the hospital on the weekend before last.  He was found at that time to be severely dehydrated and needed fluids, so he was admitted and got those and was under observation.   He is feeling much better today, has a few questions: 1) needs CMP to check kidney function.  2) asks if he can restart his fluid pills as his legs are quite swollen today.  3) needs AWV set up for his next appointment.   No other concerns at this time.   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 post. helix. 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   CAD (coronary artery disease)    CAP (community acquired pneumonia) 07/26/2020   CKD (chronic kidney disease), stage IV (HCC)    Complete tear of right rotator cuff 11/15/2015   Complex tear of lateral meniscus of left knee as current injury 12/06/2020   Complex tear of medial meniscus of left knee as current injury 10/18/2020   COPD (chronic obstructive pulmonary disease) (HCC)    DM (diabetes mellitus), type 2 (HCC) 07/30/2020   no meds   DOE (dyspnea on exertion)    GERD (gastroesophageal reflux disease)    Gout    Grade I diastolic dysfunction    Heart murmur    HFrEF (heart failure with reduced ejection  fraction) (HCC)    History of hiatal hernia    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  Performed by Dr. Zebedee Iba on 08/01/16:  29 mm commercially available Medtronic Core Valve transcatheter aortic valve procedure placed via transfemoral approach by Dr. Zebedee Iba and co-surgeon Dr. Winona Legato.   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 MOHS.   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/07/2021   Right zygoma, EDC 05/02/21   Supplemental oxygen dependent    3-4 L/[place]   Supplemental oxygen dependent    T2DM (type 2 diabetes mellitus) (HCC)  no meds    Past Surgical History:  Procedure Laterality Date   AORTIC VALVE REPLACEMENT N/A 08/01/2016   29 mm CoreValve Evolut; Location: [facility]; Surgeon: Clent Jacks, MD   CARDIAC CATHETERIZATION N/A 12/26/2012   2v CAD; retained pigtail in myocardium --> transferred to [facility]; Location: [facility]; Surgeon: Despina Hick, MD   CATARACT EXTRACTION W/PHACO Left 10/03/2022   Procedure: CATARACT EXTRACTION PHACO AND INTRAOCULAR LENS PLACEMENT (IOC) LEFT DIABETIC 8.22 00:55.5;  Surgeon: Galen Manila, MD;  Location: [facility];  Service: Ophthalmology;  Laterality: Left;  sleep apnea   CATARACT EXTRACTION W/PHACO Right 10/17/2022   Procedure: CATARACT  EXTRACTION PHACO AND INTRAOCULAR LENS PLACEMENT (IOC) RIGHT DIABETIC;  Surgeon: Galen Manila, MD;  Location: [facility];  Service: Ophthalmology;  Laterality: Right;  4.79 0:37.4   COLONOSCOPY     KNEE ARTHROSCOPY WITH MEDIAL MENISECTOMY Left 12/02/2020   Procedure: KNEE ARTHROSCOPY WITH DEBRIDEMENT AND PARTIAL MEDIAL AND LATERAL MENISECTOMY;  Surgeon: Christena Flake, MD;  Location: [facility];  Service: Orthopedics;  Laterality: Left;   LITHOTRIPSY     PERCUTANEOUS REMOVAL INTRA-AORTIC BALLOON CATH N/A 12/26/2012   Procedure: PERCUTANEOUS REMOVAL INTRA-AORTIC BALLOON CATH; Surgeon: Heloise Ochoa, MD; Location: [facility]; Service: Cardiothoracic   RIGHT HEART CATH Right 07/13/2016   Location: [facility]; Surgeon: Emilio Math, MD   TRANSESOPHAGEAL ECHOCARDIOGRAM N/A 12/26/2012   Procedure: TRANSESOPHAGEAL ECHOCARDIOGRAPHY; Surgeon: Heloise Ochoa, MD; Location: [facility]; Service: Cardiothoracic   UMBILICAL HERNIA REPAIR N/A 02/13/2014   Procedure: LAPAROSCOPIC UMBILICAL HERNIA REPAIR; Surgeon: Merlinda Frederick, MD; Location: [facility]; Service: General Surgery    Social History   Socioeconomic History   Marital status: Married    Spouse name: Korrey Copps   Number of children: 2   Years of education: Not on file   Highest education level: Not on file  Occupational History   Occupation: retired  Tobacco Use   Smoking status: Former    Current packs/day: 0.00    Average packs/day: 2.0 packs/day for 54.0 years (108.0 ttl pk-yrs)    Types: Cigarettes    Start date: 60    Quit date: 2010    Years since quitting: 14.8   Smokeless tobacco: Former    Types: Chew    Quit date: 05/16/2004  Vaping Use   Vaping status: Never Used  Substance and Sexual Activity   Alcohol use: No   Drug use: Never   Sexual activity: Not on file  Other Topics Concern   Not on file  Social History Narrative   Not on file   Social Determinants of  Health   Financial Resource Strain: Not on file  Food Insecurity: No Food Insecurity (02/05/2023)   Hunger Vital Sign    Worried About Running Out of Food in the Last Year: Never true    Ran Out of Food in the Last Year: Never true  Transportation Needs: No Transportation Needs (02/05/2023)   PRAPARE - Administrator, Civil Service (Medical): No    Lack of Transportation (Non-Medical): No  Physical Activity: Not on file  Stress: Not on file  Social Connections: Not on file  Intimate Partner Violence: Not At Risk (02/05/2023)   Humiliation, Afraid, Rape, and Kick questionnaire    Fear of Current or Ex-Partner: No    Emotionally Abused: No    Physically Abused: No    Sexually Abused: No    Family History  Adopted: Yes  Problem Relation Age of Onset  Varicose Veins Daughter    Obesity Daughter    Hyperlipidemia Daughter    Diabetes Daughter    COPD Daughter    Depression Daughter    Asthma Daughter    Arthritis Son    Diabetes Son    Hypertension Son     Allergies  Allergen Reactions   Nsaids Other (See Comments)    Avoid due to kidney disease    Statins Other (See Comments)    Myalgias   Nexletol [Bempedoic Acid] Diarrhea    fatigue    Review of Systems  Cardiovascular:  Positive for leg swelling.  All other systems reviewed and are negative.      Objective:   BP (!) 140/60   Pulse 74   Ht 5\' 3"  (1.6 m)   Wt 202 lb (91.6 kg)   SpO2 93%   BMI 35.78 kg/m   Vitals:   02/12/23 1357  BP: (!) 140/60  Pulse: 74  Height: 5\' 3"  (1.6 m)  Weight: 202 lb (91.6 kg)  SpO2: 93%  BMI (Calculated): 35.79    Physical Exam Vitals and nursing note reviewed.  Constitutional:      Appearance: Normal appearance. He is normal weight.  Eyes:     Pupils: Pupils are equal, round, and reactive to light.  Cardiovascular:     Rate and Rhythm: Normal rate and regular rhythm.     Pulses: Normal pulses.     Heart sounds: Normal heart sounds.  Pulmonary:      Effort: Pulmonary effort is normal.     Breath sounds: Normal breath sounds.  Neurological:     General: No focal deficit present.     Mental Status: He is alert and oriented to person, place, and time. Mental status is at baseline.  Psychiatric:        Mood and Affect: Mood normal.        Behavior: Behavior normal.        Thought Content: Thought content normal.        Judgment: Judgment normal.      No results found for any visits on 02/12/23.  Recent Results (from the past 2160 hour(s))  POCT CBG (Fasting - Glucose)     Status: Abnormal   Collection Time: 11/28/22 10:16 AM  Result Value Ref Range   Glucose Fasting, POC 120 (A) 70 - 99 mg/dL  TSH     Status: None   Collection Time: 11/28/22 10:50 AM  Result Value Ref Range   TSH 2.940 0.450 - 4.500 uIU/mL  Lipid panel     Status: Abnormal   Collection Time: 11/28/22 10:50 AM  Result Value Ref Range   Cholesterol, Total 106 100 - 199 mg/dL   Triglycerides 725 (H) 0 - 149 mg/dL   HDL 35 (L) >36 mg/dL   VLDL Cholesterol Cal 28 5 - 40 mg/dL   LDL Chol Calc (NIH) 43 0 - 99 mg/dL   Chol/HDL Ratio 3.0 0.0 - 5.0 ratio    Comment:                                   T. Chol/HDL Ratio                                             Men  Women  1/2 Avg.Risk  3.4    3.3                                   Avg.Risk  5.0    4.4                                2X Avg.Risk  9.6    7.1                                3X Avg.Risk 23.4   11.0   Hemoglobin A1c     Status: Abnormal   Collection Time: 11/28/22 10:50 AM  Result Value Ref Range   Hgb A1c MFr Bld 6.2 (H) 4.8 - 5.6 %    Comment:          Prediabetes: 5.7 - 6.4          Diabetes: >6.4          Glycemic control for adults with diabetes: <7.0    Est. average glucose Bld gHb Est-mCnc 131 mg/dL  [id number]     Status: Abnormal   Collection Time: 11/28/22 10:50 AM  Result Value Ref Range   Glucose 101 (H) 70 - 99 mg/dL   BUN 51 (H) 8 - 27 mg/dL    Creatinine, Ser 8.65 (H) 0.76 - 1.27 mg/dL   eGFR 18 (L) >78 IO/NGE/9.52   BUN/Creatinine Ratio 15 10 - 24   Sodium 141 134 - 144 mmol/L   Potassium 4.9 3.5 - 5.2 mmol/L   Chloride 105 96 - 106 mmol/L   CO2 23 20 - 29 mmol/L   Calcium 9.4 8.6 - 10.2 mg/dL   Total Protein 6.4 6.0 - 8.5 g/dL   Albumin 3.8 3.8 - 4.8 g/dL   Globulin, Total 2.6 1.5 - 4.5 g/dL   Bilirubin Total 0.3 0.0 - 1.2 mg/dL   Alkaline Phosphatase 86 44 - 121 IU/L   AST 16 0 - 40 IU/L   ALT 6 0 - 44 IU/L  CBC with Differential/Platelet     Status: Abnormal   Collection Time: 11/28/22 10:50 AM  Result Value Ref Range   WBC 5.9 3.4 - 10.8 x10E3/uL   RBC 3.39 (L) 4.14 - 5.80 x10E6/uL   Hemoglobin 10.7 (L) 13.0 - 17.7 g/dL   Hematocrit 84.1 (L) 32.4 - 51.0 %   MCV 95 79 - 97 fL   MCH 31.6 26.6 - 33.0 pg   MCHC 33.1 31.5 - 35.7 g/dL   RDW 40.1 02.7 - 25.3 %   Platelets 125 (L) 150 - 450 x10E3/uL   Neutrophils 64 Not Estab. %   Lymphs 25 Not Estab. %   Monocytes 9 Not Estab. %   Eos 1 Not Estab. %   Basos 0 Not Estab. %   Neutrophils Absolute 3.8 1.4 - 7.0 x10E3/uL   Lymphocytes Absolute 1.5 0.7 - 3.1 x10E3/uL   Monocytes Absolute 0.5 0.1 - 0.9 x10E3/uL   EOS (ABSOLUTE) 0.0 0.0 - 0.4 x10E3/uL   Basophils Absolute 0.0 0.0 - 0.2 x10E3/uL   Immature Granulocytes 1 Not Estab. %   Immature Grans (Abs) 0.1 0.0 - 0.1 x10E3/uL  Vitamin B12     Status: None   Collection Time: 01/30/23  1:10 PM  Result Value Ref Range   Vitamin B-12  637 180 - 914 pg/mL    Comment: (NOTE) This assay is not validated for testing neonatal or myeloproliferative syndrome specimens for Vitamin B12 levels. Performed at [facility], [address] [place], [place] [address]   Iron and TIBC     Status: None   Collection Time: 01/30/23  1:10 PM  Result Value Ref Range   Iron 74 45 - 182 ug/dL   TIBC 308 657 - 846 ug/dL   Saturation Ratios 28 17.9 - 39.5 %   UIBC 193 ug/dL    Comment: Performed at [facility], [address] [place], [place] [address]  Ferritin     Status: None   Collection Time: 01/30/23  1:10 PM  Result Value Ref Range   Ferritin 77 24 - 336 ng/mL    Comment: Performed at [facility], [address] [place], [place] [address]  CBC     Status: Abnormal   Collection Time: 01/30/23  1:10 PM  Result Value Ref Range   WBC 6.2 4.0 - 10.5 K/uL   RBC 3.25 (L) 4.22 - 5.81 MIL/uL   Hemoglobin 10.5 (L) 13.0 - 17.0 g/dL   HCT 24.4 (L) 01.0 - 27.2 %   MCV 98.2 80.0 - 100.0 fL   MCH 32.3 26.0 - 34.0 pg   MCHC 32.9 30.0 - 36.0 g/dL   RDW 53.6 64.4 - 03.4 %   Platelets 172 150 - 400 K/uL   nRBC 0.0 0.0 - 0.2 %    Comment: Performed at [facility], [address] [place], [place] [address]  Basic metabolic panel     Status: Abnormal   Collection Time: 02/04/23  4:25 PM  Result Value Ref Range   Sodium 134 (L) 135 - 145 mmol/L   Potassium 3.8 3.5 - 5.1 mmol/L   Chloride 103 98 - 111 mmol/L   CO2 15 (L) 22 - 32 mmol/L   Glucose, Bld 109 (H) 70 - 99 mg/dL    Comment: Glucose reference range applies only to samples taken after fasting for at least 8 hours.   BUN 85 (H) 8 - 23 mg/dL   Creatinine, Ser 5.63 (H) 0.61 - 1.24 mg/dL   Calcium 8.4 (L) 8.9 - 10.3 mg/dL   GFR, Estimated 11 (L) >60 mL/min    Comment: (NOTE) Calculated using the CKD-EPI Creatinine Equation (2021)    Anion gap 16 (H) 5 - 15    Comment: Performed at [facility], [address] [place], [place] [address]  CBC     Status: Abnormal   Collection Time: 02/04/23  4:25 PM  Result Value Ref Range   WBC 10.0 4.0 - 10.5 K/uL   RBC 3.85 (L) 4.22 - 5.81 MIL/uL   Hemoglobin 12.3 (L) 13.0 - 17.0 g/dL   HCT 33.2 (L) 95.1 - 88.4 %   MCV 98.2 80.0 - 100.0 fL   MCH 31.9 26.0 - 34.0 pg   MCHC 32.5 30.0 - 36.0 g/dL   RDW 16.6 06.3 - 01.6 %   Platelets 221 150 - 400 K/uL   nRBC 0.0 0.0 - 0.2 %    Comment: Performed at [facility], [address] [place], [place] [address]  Troponin  I (High Sensitivity)     Status: None   Collection Time: 02/04/23  4:25 PM  Result Value Ref Range   Troponin I (High Sensitivity) 10 <18 ng/L    Comment: (NOTE) Elevated high sensitivity troponin I (hsTnI)  values and significant  changes across serial measurements may suggest ACS but many other  chronic and acute conditions are known to elevate hsTnI results.  Refer to the "Links" section for chest pain algorithms and additional  guidance. Performed at [facility], [address] [place], [place] [address]   Brain natriuretic peptide     Status: None   Collection Time: 02/04/23  4:25 PM  Result Value Ref Range   B Natriuretic Peptide 77.7 0.0 - 100.0 pg/mL    Comment: Performed at [facility], [address] [place], [place] [address]  Hepatic function panel     Status: Abnormal   Collection Time: 02/04/23  4:25 PM  Result Value Ref Range   Total Protein 8.4 (H) 6.5 - 8.1 g/dL   Albumin 4.0 3.5 - 5.0 g/dL   AST 15 15 - 41 U/L   ALT 10 0 - 44 U/L   Alkaline Phosphatase 78 38 - 126 U/L   Total Bilirubin 0.7 0.3 - 1.2 mg/dL   Bilirubin, Direct <9.5 0.0 - 0.2 mg/dL   Indirect Bilirubin NOT CALCULATED 0.3 - 0.9 mg/dL    Comment: Performed at [facility], [address] [place], [place] [address]  Lipase, blood     Status: Abnormal   Collection Time: 02/04/23  4:25 PM  Result Value Ref Range   Lipase 56 (H) 11 - 51 U/L    Comment: Performed at [facility], [address] [place], [place] [address]  Magnesium     Status: Abnormal   Collection Time: 02/04/23  4:25 PM  Result Value Ref Range   Magnesium 1.5 (L) 1.7 - 2.4 mg/dL    Comment: Performed at [facility], [address] [place], [place] [address]  CBG monitoring, ED     Status: Abnormal   Collection Time: 02/04/23  6:52 PM  Result Value Ref Range   Glucose-Capillary 104 (H) 70 - 99 mg/dL    Comment: Glucose reference range applies only to samples taken after  fasting for at least 8 hours.  Gastrointestinal Panel by PCR , Stool     Status: None   Collection Time: 02/04/23  7:17 PM   Specimen: Stool  Result Value Ref Range   Campylobacter species NOT DETECTED NOT DETECTED   Plesimonas shigelloides NOT DETECTED NOT DETECTED   Salmonella species NOT DETECTED NOT DETECTED   Yersinia enterocolitica NOT DETECTED NOT DETECTED   Vibrio species NOT DETECTED NOT DETECTED   Vibrio cholerae NOT DETECTED NOT DETECTED   Enteroaggregative E coli (EAEC) NOT DETECTED NOT DETECTED   Enteropathogenic E coli (EPEC) NOT DETECTED NOT DETECTED   Enterotoxigenic E coli (ETEC) NOT DETECTED NOT DETECTED   Shiga like toxin producing E coli (STEC) NOT DETECTED NOT DETECTED   Shigella/Enteroinvasive E coli (EIEC) NOT DETECTED NOT DETECTED   Cryptosporidium NOT DETECTED NOT DETECTED   Cyclospora cayetanensis NOT DETECTED NOT DETECTED   Entamoeba histolytica NOT DETECTED NOT DETECTED   Giardia lamblia NOT DETECTED NOT DETECTED   Adenovirus F40/41 NOT DETECTED NOT DETECTED   Astrovirus NOT DETECTED NOT DETECTED   Norovirus GI/GII NOT DETECTED NOT DETECTED   Rotavirus A NOT DETECTED NOT DETECTED   Sapovirus (I, II, IV, and V) NOT DETECTED NOT DETECTED    Comment: Performed at [facility], [address], [place], [place] [address]  C Difficile Quick Screen w PCR reflex     Status: None   Collection Time: 02/04/23  7:17 PM  Specimen: Stool  Result Value Ref Range   C Diff antigen NEGATIVE NEGATIVE   C Diff toxin NEGATIVE NEGATIVE   C Diff interpretation No C. difficile detected.     Comment: Performed at [facility], [address] [place], [place] [address]  Stool culture     Status: None   Collection Time: 02/04/23  7:17 PM   Specimen: Stool  Result Value Ref Range   Salmonella/Shigella Screen Final report    Campylobacter Culture Final report    E coli, Shiga toxin Assay Negative Negative    Comment: (NOTE) Performed At: [facility] [address] [place], [place] [id number] Jolene Schimke MD [id number]   H. pylori antigen, stool     Status: None   Collection Time: 02/04/23  7:17 PM  Result Value Ref Range   H. Pylori Stool Ag, Eia Negative Negative    Comment: (NOTE) Performed At: [facility] [address] [place], [place] [id number] Jolene Schimke MD [id number]   Lactoferrin, Fecal, Qualitative     Status: Abnormal   Collection Time: 02/04/23  7:17 PM  Result Value Ref Range   Lactoferrin, Fecal, Qual POSITIVE (A) NEGATIVE    Comment: Performed at [facility], [address] [place], [place] [address]  STOOL CULTURE REFLEX - RSASHR     Status: None   Collection Time: 02/04/23  7:17 PM  Result Value Ref Range   Stool Culture result 1 (RSASHR) Comment     Comment: (NOTE) No Salmonella or Shigella recovered. Performed At: [facility] [address] [place], [place] [id number] Jolene Schimke MD [id number]   STOOL CULTURE Reflex - CMPCXR     Status: None   Collection Time: 02/04/23  7:17 PM  Result Value Ref Range   Stool Culture result 1 (CMPCXR) Comment     Comment: (NOTE) No Campylobacter species isolated. Performed At: [facility] [address] [place], [place] [id number] Jolene Schimke MD [id number]   CBC     Status: Abnormal   Collection Time: 02/04/23  9:23 PM  Result Value Ref Range   WBC 5.9 4.0 - 10.5 K/uL   RBC 3.36 (L) 4.22 - 5.81 MIL/uL   Hemoglobin 10.9 (L) 13.0 - 17.0 g/dL   HCT 60.7 (L) 37.1 - 06.2 %   MCV 97.6 80.0 - 100.0 fL   MCH 32.4 26.0 - 34.0 pg   MCHC 33.2 30.0 - 36.0 g/dL   RDW 69.4 85.4 - 62.7 %   Platelets 167 150 - 400 K/uL   nRBC 0.0 0.0 - 0.2 %    Comment: Performed at [facility] [address] [place], [place] [address]  Creatinine, serum     Status: Abnormal   Collection Time: 02/04/23  9:23 PM  Result Value Ref Range   Creatinine, Ser 4.77 (H) 0.61 - 1.24 mg/dL   GFR, Estimated 12 (L)  >60 mL/min    Comment: (NOTE) Calculated using the CKD-EPI Creatinine Equation (2021) Performed at [facility], [address] [place], [place] [address]   Comprehensive metabolic panel     Status: Abnormal   Collection Time: 02/05/23  4:24 AM  Result Value Ref Range   Sodium 139 135 - 145 mmol/L   Potassium 3.5 3.5 - 5.1 mmol/L   Chloride 107 98 - 111 mmol/L   CO2 18 (L) 22 - 32 mmol/L   Glucose, Bld 97 70 - 99 mg/dL    Comment: Glucose reference range applies only to samples  taken after fasting for at least 8 hours.   BUN 83 (H) 8 - 23 mg/dL   Creatinine, Ser 4.09 (H) 0.61 - 1.24 mg/dL   Calcium 8.2 (L) 8.9 - 10.3 mg/dL   Total Protein 7.3 6.5 - 8.1 g/dL   Albumin 3.4 (L) 3.5 - 5.0 g/dL   AST 11 (L) 15 - 41 U/L   ALT 7 0 - 44 U/L   Alkaline Phosphatase 72 38 - 126 U/L   Total Bilirubin 0.5 0.3 - 1.2 mg/dL   GFR, Estimated 12 (L) >60 mL/min    Comment: (NOTE) Calculated using the CKD-EPI Creatinine Equation (2021)    Anion gap 14 5 - 15    Comment: Performed at [facility], [address] [place], [place] [address]  CBC     Status: Abnormal   Collection Time: 02/05/23  4:24 AM  Result Value Ref Range   WBC 5.6 4.0 - 10.5 K/uL   RBC 3.36 (L) 4.22 - 5.81 MIL/uL   Hemoglobin 11.0 (L) 13.0 - 17.0 g/dL   HCT 47.8 (L) 29.5 - 62.1 %   MCV 97.6 80.0 - 100.0 fL   MCH 32.7 26.0 - 34.0 pg   MCHC 33.5 30.0 - 36.0 g/dL   RDW 30.8 65.7 - 84.6 %   Platelets 147 (L) 150 - 400 K/uL   nRBC 0.0 0.0 - 0.2 %    Comment: Performed at [facility], [address] [place], [place] [address]  Urinalysis, Routine w reflex microscopic -Urine, Clean Catch     Status: Abnormal   Collection Time: 02/05/23  4:26 AM  Result Value Ref Range   Color, Urine YELLOW (A) YELLOW   APPearance HAZY (A) CLEAR   Specific Gravity, Urine 1.010 1.005 - 1.030   pH 5.0 5.0 - 8.0   Glucose, UA NEGATIVE NEGATIVE mg/dL   Hgb urine dipstick NEGATIVE NEGATIVE   Bilirubin Urine  NEGATIVE NEGATIVE   Ketones, ur NEGATIVE NEGATIVE mg/dL   Protein, ur 284 (A) NEGATIVE mg/dL   Nitrite NEGATIVE NEGATIVE   Leukocytes,Ua NEGATIVE NEGATIVE   RBC / HPF 0-5 0 - 5 RBC/hpf   WBC, UA 0-5 0 - 5 WBC/hpf   Bacteria, UA NONE SEEN NONE SEEN   Squamous Epithelial / HPF 0 0 - 5 /HPF   Mucus PRESENT     Comment: Performed at [facility], [address] [place], [place] [address]  CBG monitoring, ED     Status: Abnormal   Collection Time: 02/05/23  8:45 AM  Result Value Ref Range   Glucose-Capillary 105 (H) 70 - 99 mg/dL    Comment: Glucose reference range applies only to samples taken after fasting for at least 8 hours.  CBG monitoring, ED     Status: Abnormal   Collection Time: 02/05/23  1:04 PM  Result Value Ref Range   Glucose-Capillary 114 (H) 70 - 99 mg/dL    Comment: Glucose reference range applies only to samples taken after fasting for at least 8 hours.  Glucose, capillary     Status: Abnormal   Collection Time: 02/05/23  6:35 PM  Result Value Ref Range   Glucose-Capillary 123 (H) 70 - 99 mg/dL    Comment: Glucose reference range applies only to samples taken after fasting for at least 8 hours.   Comment 1 Notify RN   Glucose, capillary     Status: None   Collection Time: 02/05/23  9:30 PM  Result Value Ref Range  Glucose-Capillary 80 70 - 99 mg/dL    Comment: Glucose reference range applies only to samples taken after fasting for at least 8 hours.   Comment 1 Notify RN   Basic metabolic panel     Status: Abnormal   Collection Time: 02/06/23  4:32 AM  Result Value Ref Range   Sodium 140 135 - 145 mmol/L   Potassium 3.3 (L) 3.5 - 5.1 mmol/L   Chloride 110 98 - 111 mmol/L   CO2 18 (L) 22 - 32 mmol/L   Glucose, Bld 94 70 - 99 mg/dL    Comment: Glucose reference range applies only to samples taken after fasting for at least 8 hours.   BUN 76 (H) 8 - 23 mg/dL   Creatinine, Ser 3.47 (H) 0.61 - 1.24 mg/dL   Calcium 8.1 (L) 8.9 - 10.3 mg/dL   GFR,  Estimated 13 (L) >60 mL/min    Comment: (NOTE) Calculated using the CKD-EPI Creatinine Equation (2021)    Anion gap 12 5 - 15    Comment: Performed at [facility], [address] [place], [place] [address]  Glucose, capillary     Status: Abnormal   Collection Time: 02/06/23  7:41 AM  Result Value Ref Range   Glucose-Capillary 102 (H) 70 - 99 mg/dL    Comment: Glucose reference range applies only to samples taken after fasting for at least 8 hours.   Comment 1 Notify RN    Comment 2 Document in Chart   Glucose, capillary     Status: Abnormal   Collection Time: 02/06/23 11:44 AM  Result Value Ref Range   Glucose-Capillary 112 (H) 70 - 99 mg/dL    Comment: Glucose reference range applies only to samples taken after fasting for at least 8 hours.   Comment 1 Notify RN    Comment 2 Document in Chart        Assessment & Plan:   Problem List Items Addressed This Visit       Active Problems   CKD (chronic kidney disease), stage IV (HCC) - Primary   Relevant Orders   CMP14+EGFR   Secondary hyperparathyroidism of renal origin (HCC)   Checking labs today.  Will restart pt's fluid pill.   Return in about 1 month (around 03/15/2023) for AWV.   Total time spent: 20 minutes  Miki Kins, FNP  02/12/2023   This document may have been prepared by [facility] Voice Recognition software and as such may include unintentional dictation errors.

## 2023-02-13 LAB — CMP14+EGFR
ALT: 10 [IU]/L (ref 0–44)
AST: 15 [IU]/L (ref 0–40)
Albumin: 3.4 g/dL — ABNORMAL LOW (ref 3.8–4.8)
Alkaline Phosphatase: 78 [IU]/L (ref 44–121)
BUN/Creatinine Ratio: 14 (ref 10–24)
BUN: 45 mg/dL — ABNORMAL HIGH (ref 8–27)
Bilirubin Total: 0.2 mg/dL (ref 0.0–1.2)
CO2: 20 mmol/L (ref 20–29)
Calcium: 9 mg/dL (ref 8.6–10.2)
Chloride: 110 mmol/L — ABNORMAL HIGH (ref 96–106)
Creatinine, Ser: 3.28 mg/dL — ABNORMAL HIGH (ref 0.76–1.27)
Globulin, Total: 3 g/dL (ref 1.5–4.5)
Glucose: 110 mg/dL — ABNORMAL HIGH (ref 70–99)
Potassium: 5.4 mmol/L — ABNORMAL HIGH (ref 3.5–5.2)
Sodium: 145 mmol/L — ABNORMAL HIGH (ref 134–144)
Total Protein: 6.4 g/dL (ref 6.0–8.5)
eGFR: 19 mL/min/{1.73_m2} — ABNORMAL LOW (ref >59)

## 2023-02-20 DIAGNOSIS — C44329 Squamous cell carcinoma of skin of other parts of face: Secondary | ICD-10-CM | POA: Diagnosis not present

## 2023-02-28 ENCOUNTER — Ambulatory Visit: Payer: Medicare HMO | Admitting: Family

## 2023-03-06 ENCOUNTER — Other Ambulatory Visit: Payer: Medicare HMO

## 2023-03-06 DIAGNOSIS — E039 Hypothyroidism, unspecified: Secondary | ICD-10-CM

## 2023-03-06 DIAGNOSIS — E785 Hyperlipidemia, unspecified: Secondary | ICD-10-CM

## 2023-03-06 DIAGNOSIS — E1122 Type 2 diabetes mellitus with diabetic chronic kidney disease: Secondary | ICD-10-CM | POA: Diagnosis not present

## 2023-03-07 ENCOUNTER — Ambulatory Visit: Payer: Medicare HMO | Admitting: Dermatology

## 2023-03-07 ENCOUNTER — Encounter: Payer: Self-pay | Admitting: Dermatology

## 2023-03-07 DIAGNOSIS — L821 Other seborrheic keratosis: Secondary | ICD-10-CM

## 2023-03-07 DIAGNOSIS — L578 Other skin changes due to chronic exposure to nonionizing radiation: Secondary | ICD-10-CM | POA: Diagnosis not present

## 2023-03-07 DIAGNOSIS — Z1283 Encounter for screening for malignant neoplasm of skin: Secondary | ICD-10-CM

## 2023-03-07 DIAGNOSIS — D492 Neoplasm of unspecified behavior of bone, soft tissue, and skin: Secondary | ICD-10-CM | POA: Diagnosis not present

## 2023-03-07 DIAGNOSIS — L814 Other melanin hyperpigmentation: Secondary | ICD-10-CM

## 2023-03-07 DIAGNOSIS — L820 Inflamed seborrheic keratosis: Secondary | ICD-10-CM | POA: Diagnosis not present

## 2023-03-07 DIAGNOSIS — Z86007 Personal history of in-situ neoplasm of skin: Secondary | ICD-10-CM

## 2023-03-07 DIAGNOSIS — D1801 Hemangioma of skin and subcutaneous tissue: Secondary | ICD-10-CM | POA: Diagnosis not present

## 2023-03-07 DIAGNOSIS — L570 Actinic keratosis: Secondary | ICD-10-CM | POA: Diagnosis not present

## 2023-03-07 DIAGNOSIS — W908XXA Exposure to other nonionizing radiation, initial encounter: Secondary | ICD-10-CM

## 2023-03-07 DIAGNOSIS — Z85828 Personal history of other malignant neoplasm of skin: Secondary | ICD-10-CM

## 2023-03-07 DIAGNOSIS — D692 Other nonthrombocytopenic purpura: Secondary | ICD-10-CM

## 2023-03-07 DIAGNOSIS — D229 Melanocytic nevi, unspecified: Secondary | ICD-10-CM

## 2023-03-07 DIAGNOSIS — D485 Neoplasm of uncertain behavior of skin: Secondary | ICD-10-CM

## 2023-03-07 LAB — HEMOGLOBIN A1C
Est. average glucose Bld gHb Est-mCnc: 111 mg/dL
Hgb A1c MFr Bld: 5.5 % (ref 4.8–5.6)

## 2023-03-07 LAB — CMP14+EGFR
ALT: 6 [IU]/L (ref 0–44)
AST: 18 [IU]/L (ref 0–40)
Albumin: 3.8 g/dL (ref 3.8–4.8)
Alkaline Phosphatase: 101 [IU]/L (ref 44–121)
BUN/Creatinine Ratio: 13 (ref 10–24)
BUN: 47 mg/dL — ABNORMAL HIGH (ref 8–27)
Bilirubin Total: 0.3 mg/dL (ref 0.0–1.2)
CO2: 21 mmol/L (ref 20–29)
Calcium: 9.2 mg/dL (ref 8.6–10.2)
Chloride: 103 mmol/L (ref 96–106)
Creatinine, Ser: 3.72 mg/dL — ABNORMAL HIGH (ref 0.76–1.27)
Globulin, Total: 3.2 g/dL (ref 1.5–4.5)
Glucose: 106 mg/dL — ABNORMAL HIGH (ref 70–99)
Potassium: 5.2 mmol/L (ref 3.5–5.2)
Sodium: 142 mmol/L (ref 134–144)
Total Protein: 7 g/dL (ref 6.0–8.5)
eGFR: 16 mL/min/{1.73_m2} — ABNORMAL LOW (ref >59)

## 2023-03-07 LAB — LIPID PANEL
Chol/HDL Ratio: 2.5 ratio (ref 0.0–5.0)
Cholesterol, Total: 84 mg/dL — ABNORMAL LOW (ref 100–199)
HDL: 33 mg/dL — ABNORMAL LOW (ref >39)
LDL Chol Calc (NIH): 24 mg/dL (ref 0–99)
Triglycerides: 164 mg/dL — ABNORMAL HIGH (ref 0–149)
VLDL Cholesterol Cal: 27 mg/dL (ref 5–40)

## 2023-03-07 LAB — TSH: TSH: 4.6 u[IU]/mL — ABNORMAL HIGH (ref 0.450–4.500)

## 2023-03-07 NOTE — Progress Notes (Signed)
Follow-Up Visit   Subjective  [name] [name] is a 77 y.o. male who presents for the following: Skin Cancer Screening and Upper Body Skin Exam  The patient presents for Upper Body Skin Exam (UBSE) for skin cancer screening and mole check. The patient has spots, moles and lesions to be evaluated, some may be new or changing and the patient may have concern these could be cancer.  Patient with hx of SCC, SCCis, BCC.   The following portions of the chart were reviewed this encounter and updated as appropriate: medications, allergies, medical history  Review of Systems:  No other skin or systemic complaints except as noted in HPI or Assessment and Plan.  Objective  Well appearing patient in no apparent distress; mood and affect are within normal limits.  All skin waist up examined. Relevant physical exam findings are noted in the Assessment and Plan.  right dorsal hand 7 mm pink keratotic papule     Scalp x 6, L helix x 1, R temple x 1, R forearm x 2, L forearm x 1 (11) Erythematous thin papules/macules with gritty scale.     Assessment & Plan   Neoplasm of uncertain behavior of skin right dorsal hand  Skin / nail biopsy Type of biopsy: tangential   Informed consent: discussed and consent obtained   Timeout: patient name, date of birth, surgical site, and procedure verified   Procedure prep:  Patient was prepped and draped in usual sterile fashion Prep type:  Isopropyl alcohol Anesthesia: the lesion was anesthetized in a standard fashion   Anesthetic:  1% lidocaine w/ epinephrine 1-100,000 buffered w/ 8.4% NaHCO3 Instrument used: DermaBlade   Hemostasis achieved with: pressure and aluminum chloride   Outcome: patient tolerated procedure well   Post-procedure details: sterile dressing applied and wound care instructions given   Dressing type: bandage and petrolatum    Specimen 1 - Surgical pathology Differential Diagnosis: r/o SCC vs hypertrophic AK  Check Margins:  No 7 mm pink keratotic papule  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 They are chronic with expected duration over 1 year. A portion of actinic keratoses will progress to squamous cell carcinoma of the skin. It is not possible to reliably predict which spots will progress to skin cancer and so treatment is recommended to prevent development of skin cancer.  Recommend daily broad spectrum sunscreen SPF 30+ to sun-exposed areas, reapply every 2 hours as needed.  Recommend staying in the shade or wearing long sleeves, sun glasses (UVA+UVB protection) and wide brim hats (4-inch brim around the entire circumference of the hat). Call for new or changing lesions.   Destruction of lesion - Scalp x 6, L helix x 1, R temple x 1, R forearm x 2, L forearm x 1 (11) Complexity: simple   Destruction method: cryotherapy   Informed consent: discussed and consent obtained   Timeout:  patient name, date of birth, surgical site, and procedure verified Lesion destroyed using liquid nitrogen: Yes   Region frozen until ice ball extended beyond lesion: Yes   Cryo cycles: 1 or 2. Outcome: patient tolerated procedure well with no complications   Post-procedure details: wound care instructions given    Multiple benign nevi  Lentigines  Seborrheic keratoses  Actinic elastosis  Cherry angioma  Solar purpura (HCC)  Skin cancer screening performed today.  Actinic Damage - Chronic condition, secondary to cumulative UV/sun exposure - diffuse scaly erythematous macules with underlying dyspigmentation - Recommend daily broad spectrum sunscreen SPF 30+ to sun-exposed areas, reapply every 2 hours as needed.  - Staying in the shade or wearing long sleeves, sun glasses (UVA+UVB protection) and wide brim hats (4-inch brim around the  entire circumference of the hat) are also recommended for sun protection.  - Call for new or changing lesions.  Lentigines, Seborrheic Keratoses, Hemangiomas - Benign normal skin lesions - Benign-appearing - Call for any changes  Melanocytic Nevi - Tan-brown and/or pink-flesh-colored symmetric macules and papules - Benign appearing on exam today - Observation - Call clinic for new or changing moles - Recommend daily use of broad spectrum spf 30+ sunscreen to sun-exposed areas.   HISTORY OF BASAL CELL CARCINOMA OF THE SKIN - No evidence of recurrence today - Recommend regular full body skin exams - Recommend daily broad spectrum sunscreen SPF 30+ to sun-exposed areas, reapply every 2 hours as needed.  - Call if any new or changing lesions are noted between office visits  HISTORY OF SQUAMOUS CELL CARCINOMA OF THE SKIN - No evidence of recurrence today - No lymphadenopathy - Recommend regular full body skin exams - Recommend daily broad spectrum sunscreen SPF 30+ to sun-exposed areas, reapply every 2 hours as needed.  - Call if any new or changing lesions are noted between office visits  HISTORY OF SQUAMOUS CELL CARCINOMA IN SITU OF THE SKIN - No evidence of recurrence today - Recommend regular full body skin exams - Recommend daily broad spectrum sunscreen SPF 30+ to sun-exposed areas, reapply every 2 hours as needed.  - Call if any new or changing lesions are noted between office visits   Return in about 6 months (around 09/04/2023) for UBSE, with Dr. Katrinka Blazing, Hx BCC, Hx SCC, Hx SCCis, Hx AK.  Anise Salvo, RMA, am acting as scribe for Elie Goody, MD .   Documentation: I have reviewed the above documentation for accuracy and completeness, and I agree with the above.  Elie Goody, MD

## 2023-03-07 NOTE — Patient Instructions (Signed)
Wound Care Instructions  Cleanse wound gently with soap and water once a day then pat dry with clean gauze. Apply a thin coat of Petrolatum (petroleum jelly, "Vaseline") over the wound (unless you have an allergy to this). We recommend that you use a new, sterile tube of Vaseline. Do not pick or remove scabs. Do not remove the yellow or white "healing tissue" from the base of the wound.  Cover the wound with fresh, clean, nonstick gauze and secure with paper tape. You may use Band-Aids in place of gauze and tape if the wound is small enough, but would recommend trimming much of the tape off as there is often too much. Sometimes Band-Aids can irritate the skin.  You should call the office for your biopsy report after 1 week if you have not already been contacted.  If you experience any problems, such as abnormal amounts of bleeding, swelling, significant bruising, significant pain, or evidence of infection, please call the office immediately.  FOR ADULT SURGERY PATIENTS: If you need something for pain relief you may take 1 extra strength Tylenol (acetaminophen) AND 2 Ibuprofen (200mg  each) together every 4 hours as needed for pain. (do not take these if you are allergic to them or if you have a reason you should not take them.) Typically, you may only need pain medication for 1 to 3 days.   Melanoma ABCDEs  Melanoma is the most dangerous type of skin cancer, and is the leading cause of death from skin disease.  You are more likely to develop melanoma if you: Have light-colored skin, light-colored eyes, or red or blond hair Spend a lot of time in the sun Tan regularly, either outdoors or in a tanning bed Have had blistering sunburns, especially during childhood Have a close family member who has had a melanoma Have atypical moles or large birthmarks  Early detection of melanoma is key since treatment is typically straightforward and cure rates are extremely high if we catch it early.   The  first sign of melanoma is often a change in a mole or a new dark spot.  The ABCDE system is a way of remembering the signs of melanoma.  A for asymmetry:  The two halves do not match. B for border:  The edges of the growth are irregular. C for color:  A mixture of colors are present instead of an even brown color. D for diameter:  Melanomas are usually (but not always) greater than 6mm - the size of a pencil eraser. E for evolution:  The spot keeps changing in size, shape, and color.  Please check your skin once per month between visits. You can use a small mirror in front and a large mirror behind you to keep an eye on the back side or your body.   If you see any new or changing lesions before your next follow-up, please call to schedule a visit.  Please continue daily skin protection including broad spectrum sunscreen SPF 30+ to sun-exposed areas, reapplying every 2 hours as needed when you're outdoors.    Due to recent changes in healthcare laws, you may see results of your pathology and/or laboratory studies on MyChart before the doctors have had a chance to review them. We understand that in some cases there may be results that are confusing or concerning to you. Please understand that not all results are received at the same time and often the doctors may need to interpret multiple results in order to provide you  with the best plan of care or course of treatment. Therefore, we ask that you please give [place] 2 business days to thoroughly review all your results before contacting the office for clarification. Should we see a critical lab result, you will be contacted sooner.   If You Need Anything After Your Visit  If you have any questions or concerns for your doctor, please call our main line at [phone] and press option 4 to reach your doctor's medical assistant. If no one answers, please leave a voicemail as directed and we will return your call as soon as possible. Messages left after 4  pm will be answered the following business day.   You may also send [place] a message via MyChart. We typically respond to MyChart messages within 1-2 business days.  For prescription refills, please ask your pharmacy to contact our office. Our fax number is [phone].  If you have an urgent issue when the clinic is closed that cannot wait until the next business day, you can page your doctor at the number below.    Please note that while we do our best to be available for urgent issues outside of office hours, we are not available 24/7.   If you have an urgent issue and are unable to reach [place], you may choose to seek medical care at your doctor's office, retail clinic, urgent care center, or emergency room.  If you have a medical emergency, please immediately call 911 or go to the emergency department.  Pager Numbers  - Dr. Gwen Pounds: [phone]  - Dr. Roseanne Reno: [phone]  - Dr. Katrinka Blazing: [phone]   In the event of inclement weather, please call our main line at [phone] for an update on the status of any delays or closures.  Dermatology Medication Tips: Please keep the boxes that topical medications come in in order to help keep track of the instructions about where and how to use these. Pharmacies typically print the medication instructions only on the boxes and not directly on the medication tubes.   If your medication is too expensive, please contact our office at [phone] option 4 or send [place] a message through MyChart.   We are unable to tell what your co-pay for medications will be in advance as this is different depending on your insurance coverage. However, we may be able to find a substitute medication at lower cost or fill out paperwork to get insurance to cover a needed medication.   If a prior authorization is required to get your medication covered by your insurance company, please allow [place] 1-2 business days to complete this process.  Drug prices often vary  depending on where the prescription is filled and some pharmacies may offer cheaper prices.  The website www.goodrx.com contains coupons for medications through different pharmacies. The prices here do not account for what the cost may be with help from insurance (it may be cheaper with your insurance), but the website can give you the price if you did not use any insurance.  - You can print the associated coupon and take it with your prescription to the pharmacy.  - You may also stop by our office during regular business hours and pick up a GoodRx coupon card.  - If you need your prescription sent electronically to a different pharmacy, notify our office through [facility] or by phone at [phone] option 4.

## 2023-03-09 ENCOUNTER — Encounter: Payer: Self-pay | Admitting: Family

## 2023-03-09 ENCOUNTER — Ambulatory Visit: Payer: Medicare HMO | Admitting: Family

## 2023-03-09 VITALS — BP 140/60 | HR 65 | Ht 63.0 in | Wt 200.4 lb

## 2023-03-09 DIAGNOSIS — E1122 Type 2 diabetes mellitus with diabetic chronic kidney disease: Secondary | ICD-10-CM | POA: Diagnosis not present

## 2023-03-09 DIAGNOSIS — Z0001 Encounter for general adult medical examination with abnormal findings: Secondary | ICD-10-CM

## 2023-03-09 DIAGNOSIS — Z23 Encounter for immunization: Secondary | ICD-10-CM | POA: Diagnosis not present

## 2023-03-09 DIAGNOSIS — H353221 Exudative age-related macular degeneration, left eye, with active choroidal neovascularization: Secondary | ICD-10-CM | POA: Diagnosis not present

## 2023-03-09 LAB — GLUCOSE, POCT (MANUAL RESULT ENTRY): POC Glucose: 155 mg/dL — AB (ref 70–99)

## 2023-03-09 LAB — SURGICAL PATHOLOGY

## 2023-03-13 DIAGNOSIS — H903 Sensorineural hearing loss, bilateral: Secondary | ICD-10-CM | POA: Diagnosis not present

## 2023-03-18 ENCOUNTER — Encounter: Payer: Self-pay | Admitting: Family

## 2023-03-18 NOTE — Progress Notes (Signed)
Annual Wellness Visit  Patient: [name] [name], Male    DOB: [birth year], 77 y.o.   MRN: [id number] Visit Date: 03/18/2023  Today's Provider: Miki Kins, FNP  Subjective:    Chief Complaint  Patient presents with   Annual Exam    AWV   [name] [name] is a 77 y.o. male who presents today for his Annual Wellness Visit.   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 post. helix. 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   CAD (coronary artery disease)    CAP (community acquired pneumonia) 07/26/2020   CKD (chronic kidney disease), stage IV (HCC)    Complete tear of right rotator cuff 11/15/2015   Complex tear of lateral meniscus of left knee as current injury 12/06/2020   Complex tear of medial meniscus of left knee as current injury 10/18/2020   COPD (chronic obstructive pulmonary disease) (HCC)    DM (diabetes mellitus), type 2 (HCC) 07/30/2020   no meds   DOE (dyspnea on exertion)    GERD (gastroesophageal reflux disease)    Gout    Grade I diastolic dysfunction    Heart murmur    HFrEF (heart failure with reduced ejection fraction) (HCC)    History of hiatal hernia    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  Performed by Dr. Zebedee Iba on 08/01/16:  29 mm commercially available Medtronic Core Valve transcatheter aortic valve procedure placed via transfemoral approach by Dr. Zebedee Iba and co-surgeon Dr. Winona Legato.   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 MOHS.   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/07/2021   Right zygoma, EDC 05/02/21   Supplemental oxygen dependent    3-4 L/[place]   Supplemental oxygen dependent    T2DM (type 2 diabetes mellitus) (HCC)    no meds   Past Surgical History:  Procedure Laterality Date   AORTIC VALVE REPLACEMENT N/A 08/01/2016   29 mm CoreValve Evolut; Location: [facility]; Surgeon: Clent Jacks, MD   CARDIAC CATHETERIZATION N/A 12/26/2012   2v CAD; retained pigtail in myocardium --> transferred to [facility]; Location: [facility]; Surgeon: Despina Hick, MD   CATARACT EXTRACTION W/PHACO Left 10/03/2022   Procedure: CATARACT EXTRACTION PHACO AND INTRAOCULAR LENS PLACEMENT (IOC) LEFT  DIABETIC 8.22 00:55.5;  Surgeon: Galen Manila, MD;  Location: [facility];  Service: Ophthalmology;  Laterality: Left;  sleep apnea   CATARACT EXTRACTION W/PHACO Right 10/17/2022   Procedure: CATARACT EXTRACTION PHACO AND INTRAOCULAR LENS PLACEMENT (IOC) RIGHT DIABETIC;  Surgeon: Galen Manila, MD;  Location: [facility];  Service: Ophthalmology;  Laterality: Right;  4.79 0:37.4   COLONOSCOPY     KNEE ARTHROSCOPY WITH MEDIAL MENISECTOMY Left 12/02/2020   Procedure: KNEE ARTHROSCOPY WITH DEBRIDEMENT AND PARTIAL MEDIAL AND LATERAL MENISECTOMY;  Surgeon: Christena Flake, MD;  Location: [facility];  Service: Orthopedics;  Laterality: Left;   LITHOTRIPSY     PERCUTANEOUS REMOVAL INTRA-AORTIC BALLOON CATH N/A 12/26/2012   Procedure: PERCUTANEOUS REMOVAL INTRA-AORTIC BALLOON CATH; Surgeon: Heloise Ochoa, MD; Location: [facility]; Service: Cardiothoracic   RIGHT HEART CATH Right 07/13/2016   Location: [facility]; Surgeon: Emilio Math, MD   TRANSESOPHAGEAL ECHOCARDIOGRAM N/A 12/26/2012   Procedure: TRANSESOPHAGEAL ECHOCARDIOGRAPHY; Surgeon: Heloise Ochoa, MD; Location: [facility]; Service: Cardiothoracic   UMBILICAL HERNIA REPAIR N/A 02/13/2014   Procedure: LAPAROSCOPIC UMBILICAL HERNIA REPAIR; Surgeon: Merlinda Frederick, MD; Location: [facility]; Service: General Surgery   Family History  Adopted: Yes  Problem Relation Age of Onset   Varicose Veins Daughter    Obesity Daughter    Hyperlipidemia Daughter    Diabetes Daughter    COPD Daughter    Depression Daughter    Asthma Daughter    Arthritis Son    Diabetes Son    Hypertension Son    Social History   Socioeconomic History   Marital status: Married    Spouse name: Abdulhadi Hamill   Number of children: 2   Years of education: Not on file   Highest education level: Not on file  Occupational History   Occupation: retired  Tobacco Use   Smoking status: Former    Current packs/day: 0.00    Average packs/day: 2.0 packs/day for 54.0 years (108.0 ttl pk-yrs)    Types: Cigarettes    Start date: 9    Quit date: 2010    Years since quitting: 14.9   Smokeless tobacco: Former    Types: Chew    Quit date: 05/16/2004  Vaping Use   Vaping status: Never Used  Substance and Sexual Activity   Alcohol use: No   Drug use: Never   Sexual activity: Not on file  Other Topics Concern   Not on file  Social History Narrative   Not on file   Social Determinants of Health   Financial Resource Strain: Low Risk  (03/09/2023)   Overall Financial Resource Strain  (CARDIA)    Difficulty of Paying Living Expenses: Not hard at all  Food Insecurity: No Food Insecurity (03/09/2023)   Hunger Vital Sign    Worried About Running Out of Food in the Last Year: Never true    Ran Out of Food in the Last Year: Never true  Transportation Needs: No Transportation Needs (03/09/2023)   PRAPARE - Administrator, Civil Service (Medical): No    Lack of Transportation (Non-Medical): No  Physical Activity: Inactive (03/09/2023)   Exercise Vital Sign    Days of Exercise per Week: 0 days    Minutes of Exercise per Session: 0 min  Stress: No Stress Concern Present (03/09/2023)   [employer] of Occupational Health - Occupational Stress Questionnaire    Feeling of Stress : Not at all  Social Connections: Unknown (03/09/2023)   Social Connection  and Isolation Panel [NHANES]    Frequency of Communication with Friends and Family: More than three times a week    Frequency of Social Gatherings with Friends and Family: More than three times a week    Attends Religious Services: More than 4 times per year    Active Member of [employer] or Organizations: Not on file    Attends Banker Meetings: Not on file    Marital Status: Married  Intimate Partner Violence: Not At Risk (03/09/2023)   Humiliation, Afraid, Rape, and Kick questionnaire    Fear of Current or Ex-Partner: No    Emotionally Abused: No    Physically Abused: No    Sexually Abused: No    Medications: Outpatient Medications Prior to Visit  Medication Sig   acetaminophen (TYLENOL) 650 MG CR tablet Take 1,300 mg by mouth every 8 (eight) hours as needed for pain.   albuterol (VENTOLIN HFA) 108 (90 Base) MCG/ACT inhaler Inhale 2 puffs into the lungs every 6 (six) hours as needed for wheezing or shortness of breath.   Alcohol Swabs (DROPSAFE ALCOHOL PREP) 70 % PADS Apply 1 each topically as needed (With repatha and blood sugar checks.).   Ascorbic Acid (VITAMIN C) 1000 MG tablet Take 1,000 mg  by mouth at bedtime.   aspirin EC 81 MG EC tablet Take 1 tablet (81 mg total) by mouth daily.   busPIRone (BUSPAR) 5 MG tablet TAKE 1 TABLET BY MOUTH TWICE DAILY AS NEEDED   calcitRIOL (ROCALTROL) 0.25 MCG capsule Take 0.25 mcg by mouth daily.   citalopram (CELEXA) 40 MG tablet TAKE 1 TABLET EVERY DAY   Evolocumab (REPATHA SURECLICK) 140 MG/ML SOAJ INJECT CONTENT OF 1 CARTRIDGE UNDER THE SKIN EVERY OTHER WEEK   ezetimibe (ZETIA) 10 MG tablet TAKE 1 TABLET(10 MG) BY MOUTH AT BEDTIME   febuxostat (ULORIC) 40 MG tablet Take 40 mg by mouth daily.   fexofenadine (ALLEGRA) 180 MG tablet Take 180 mg by mouth daily as needed for allergies or rhinitis.   Fluticasone-Umeclidin-Vilant (TRELEGY ELLIPTA) 100-62.5-25 MCG/ACT AEPB Inhale 1 Inhalation into the lungs daily at 6 (six) AM. (Patient taking differently: Inhale 1 Inhalation into the lungs daily as needed.)   gabapentin (NEURONTIN) 100 MG capsule Take 200 mg by mouth every morning.   gabapentin (NEURONTIN) 400 MG capsule TAKE 1 CAPSULE AT BEDTIME   ipratropium-albuterol (DUONEB) 0.5-2.5 (3) MG/3ML SOLN Take 3 mLs by nebulization every 4 (four) hours as needed.   metoprolol succinate (TOPROL-XL) 50 MG 24 hr tablet Take 50 mg by mouth every morning. Take with or immediately following a meal.   Multiple Vitamins-Minerals (PRESERVISION AREDS) CAPS Take 1 capsule by mouth 2 (two) times daily.   OXYGEN Inhale 3 L into the lungs See admin instructions. Used as needed throughout the day and continuous at bedtime   pantoprazole (PROTONIX) 40 MG tablet Take 40 mg by mouth every morning.   Simethicone (GAS-X PO) Take 1 tablet by mouth 2 (two) times daily as needed (flatulence).   sodium chloride (OCEAN) 0.65 % SOLN nasal spray Place 1 spray into both nostrils as needed for congestion.   SUPER B COMPLEX/C PO Take 1 tablet by mouth daily.   tamsulosin (FLOMAX) 0.4 MG CAPS capsule TAKE 1 CAPSULE EVERY DAY   No facility-administered medications prior to visit.     Allergies  Allergen Reactions   Nsaids Other (See Comments)    Avoid due to kidney disease    Statins Other (See Comments)    Myalgias  Nexletol [Bempedoic Acid] Diarrhea    fatigue   Silver Other (See Comments)    Patient Care Team: Miki Kins, FNP as PCP - General (Family Medicine) Otho Ket, RN as [facility] Care Management Creig Hines, MD as Consulting Physician (Oncology)  Review of Systems  All other systems reviewed and are negative.      Objective:    Vitals: BP (!) 140/60   Pulse 65   Ht 5\' 3"  (1.6 m)   Wt 200 lb 6.4 oz (90.9 kg)   SpO2 94%   BMI 35.50 kg/m    Physical Exam Vitals and nursing note reviewed.  Constitutional:      Appearance: Normal appearance. He is normal weight.  Eyes:     Pupils: Pupils are equal, round, and reactive to light.  Cardiovascular:     Rate and Rhythm: Normal rate and regular rhythm.     Pulses: Normal pulses.     Heart sounds: Normal heart sounds.  Pulmonary:     Effort: Pulmonary effort is normal.     Breath sounds: Wheezing present.  Neurological:     Mental Status: He is alert.  Psychiatric:        Mood and Affect: Mood normal.        Behavior: Behavior normal.        Thought Content: Thought content normal.        Judgment: Judgment normal.      Most recent functional status assessment:    03/18/2023   12:26 PM  In your present state of health, do you have any difficulty performing the following activities:  Hearing? 0  Vision? 0  Difficulty concentrating or making decisions? 0  Walking or climbing stairs? 1  Dressing or bathing? 0  Doing errands, shopping? 0  Preparing Food and eating ? N  Using the Toilet? N  In the past six months, have you accidently leaked urine? N  Do you have problems with loss of bowel control? N  Managing your Medications? N  Managing your Finances? N  Housekeeping or managing your Housekeeping? N    Most recent fall risk assessment:     02/14/2022    1:23 PM  Fall Risk   Falls in the past year? 1  Number falls in past yr: 0  Injury with Fall? 1  Comment bloody nose  Risk for fall due to : History of fall(s)  Follow up Falls prevention discussed     Most recent depression screenings:    03/09/2023    2:30 PM 11/29/2021   11:38 AM  PHQ 2/9 Scores  PHQ - 2 Score 1 0    Most recent cognitive screening:     No data to display          Results for orders placed or performed in visit on 03/09/23  POCT Glucose (CBG)  Result Value Ref Range   POC Glucose 155 (A) 70 - 99 mg/dl       Assessment & Plan:      Annual wellness visit done today including the all of the following: Reviewed patient's Family Medical History Reviewed and updated list of patient's medical providers Assessment of cognitive impairment was done Assessed patient's functional ability Established a written schedule for health screening services Health Risk Assessent Completed and Reviewed  Exercise Activities and Dietary recommendations  Goals   None     Immunization History  Administered Date(s) Administered   DTaP 08/10/2008, 02/17/2019   Fluad  Trivalent(High Dose 65+) 03/09/2023   Influenza-Unspecified 02/17/2020, 02/28/2021, 02/24/2022    Health Maintenance  Topic Date Due   COVID-19 Vaccine (1) Never done   Pneumonia Vaccine 27+ Years old (1 of 2 - PCV) Never done   FOOT EXAM  Never done   Zoster Vaccines- Shingrix (1 of 2) Never done   OPHTHALMOLOGY EXAM  12/29/2022   Medicare Annual Wellness (AWV)  02/25/2023   Diabetic kidney evaluation - Urine ACR  05/30/2023   Lung Cancer Screening  06/07/2023   HEMOGLOBIN A1C  09/03/2023   Diabetic kidney evaluation - eGFR measurement  03/05/2024   DTaP/Tdap/Td (3 - Tdap) 02/16/2029   INFLUENZA VACCINE  Completed   Hepatitis C Screening  Completed   HPV VACCINES  Aged Out   Colonoscopy  Discontinued     Discussed health benefits of physical activity, and encouraged him to  engage in regular exercise appropriate for his age and condition.         Miki Kins, FNP   03/09/2023  This document may have been prepared by Dragon Voice Recognition software and as such may include unintentional dictation errors.

## 2023-03-20 ENCOUNTER — Telehealth: Payer: Self-pay | Admitting: Family

## 2023-03-20 DIAGNOSIS — R809 Proteinuria, unspecified: Secondary | ICD-10-CM | POA: Diagnosis not present

## 2023-03-20 DIAGNOSIS — N050 Unspecified nephritic syndrome with minor glomerular abnormality: Secondary | ICD-10-CM | POA: Diagnosis not present

## 2023-03-20 DIAGNOSIS — E875 Hyperkalemia: Secondary | ICD-10-CM | POA: Diagnosis not present

## 2023-03-20 DIAGNOSIS — D631 Anemia in chronic kidney disease: Secondary | ICD-10-CM | POA: Diagnosis not present

## 2023-03-20 DIAGNOSIS — N184 Chronic kidney disease, stage 4 (severe): Secondary | ICD-10-CM | POA: Diagnosis not present

## 2023-03-20 DIAGNOSIS — N2581 Secondary hyperparathyroidism of renal origin: Secondary | ICD-10-CM | POA: Diagnosis not present

## 2023-03-20 DIAGNOSIS — I129 Hypertensive chronic kidney disease with stage 1 through stage 4 chronic kidney disease, or unspecified chronic kidney disease: Secondary | ICD-10-CM | POA: Diagnosis not present

## 2023-03-20 DIAGNOSIS — I10 Essential (primary) hypertension: Secondary | ICD-10-CM | POA: Diagnosis not present

## 2023-03-20 DIAGNOSIS — E1122 Type 2 diabetes mellitus with diabetic chronic kidney disease: Secondary | ICD-10-CM | POA: Diagnosis not present

## 2023-03-20 NOTE — Telephone Encounter (Signed)
[name] states that patient is having diarrhea and vomiting. She doesn't know what is causing this or why he is having this again. He is able to keep fluids down but they're afraid of him getting dehydrated and ending up in the hospital again. Please advise on what to do.

## 2023-03-23 ENCOUNTER — Telehealth: Payer: Self-pay

## 2023-03-23 ENCOUNTER — Other Ambulatory Visit: Payer: Self-pay

## 2023-03-23 ENCOUNTER — Other Ambulatory Visit: Payer: Self-pay | Admitting: Family

## 2023-03-23 ENCOUNTER — Emergency Department
Admission: EM | Admit: 2023-03-23 | Discharge: 2023-03-23 | Disposition: A | Discharge status: Home / Self Care | Payer: Medicare HMO | Attending: Emergency Medicine | Admitting: Emergency Medicine

## 2023-03-23 DIAGNOSIS — I129 Hypertensive chronic kidney disease with stage 1 through stage 4 chronic kidney disease, or unspecified chronic kidney disease: Secondary | ICD-10-CM | POA: Insufficient documentation

## 2023-03-23 DIAGNOSIS — J449 Chronic obstructive pulmonary disease, unspecified: Secondary | ICD-10-CM | POA: Insufficient documentation

## 2023-03-23 DIAGNOSIS — N184 Chronic kidney disease, stage 4 (severe): Secondary | ICD-10-CM

## 2023-03-23 DIAGNOSIS — E1122 Type 2 diabetes mellitus with diabetic chronic kidney disease: Secondary | ICD-10-CM | POA: Insufficient documentation

## 2023-03-23 DIAGNOSIS — Z20822 Contact with and (suspected) exposure to covid-19: Secondary | ICD-10-CM | POA: Insufficient documentation

## 2023-03-23 DIAGNOSIS — N189 Chronic kidney disease, unspecified: Secondary | ICD-10-CM | POA: Diagnosis not present

## 2023-03-23 DIAGNOSIS — R112 Nausea with vomiting, unspecified: Secondary | ICD-10-CM | POA: Diagnosis present

## 2023-03-23 DIAGNOSIS — K2900 Acute gastritis without bleeding: Secondary | ICD-10-CM | POA: Insufficient documentation

## 2023-03-23 LAB — COMPREHENSIVE METABOLIC PANEL
ALT: 8 U/L (ref 0–44)
AST: 14 U/L — ABNORMAL LOW (ref 15–41)
Albumin: 3.8 g/dL (ref 3.5–5.0)
Alkaline Phosphatase: 83 U/L (ref 38–126)
Anion gap: 12 (ref 5–15)
BUN: 72 mg/dL — ABNORMAL HIGH (ref 8–23)
CO2: 20 mmol/L — ABNORMAL LOW (ref 22–32)
Calcium: 9 mg/dL (ref 8.9–10.3)
Chloride: 108 mmol/L (ref 98–111)
Creatinine, Ser: 4.49 mg/dL — ABNORMAL HIGH (ref 0.61–1.24)
GFR, Estimated: 13 mL/min — ABNORMAL LOW (ref >60)
Glucose, Bld: 124 mg/dL — ABNORMAL HIGH (ref 70–99)
Potassium: 4.5 mmol/L (ref 3.5–5.1)
Sodium: 140 mmol/L (ref 135–145)
Total Bilirubin: 0.7 mg/dL (ref <1.2)
Total Protein: 8.1 g/dL (ref 6.5–8.1)

## 2023-03-23 LAB — URINALYSIS, ROUTINE W REFLEX MICROSCOPIC
Bacteria, UA: NONE SEEN
Bilirubin Urine: NEGATIVE
Glucose, UA: NEGATIVE mg/dL
Hgb urine dipstick: NEGATIVE
Ketones, ur: NEGATIVE mg/dL
Leukocytes,Ua: NEGATIVE
Nitrite: NEGATIVE
Protein, ur: 100 mg/dL — AB
Specific Gravity, Urine: 1.009 (ref 1.005–1.030)
pH: 5 (ref 5.0–8.0)

## 2023-03-23 LAB — CBC
HCT: 36.5 % — ABNORMAL LOW (ref 39.0–52.0)
Hemoglobin: 11.6 g/dL — ABNORMAL LOW (ref 13.0–17.0)
MCH: 32 pg (ref 26.0–34.0)
MCHC: 31.8 g/dL (ref 30.0–36.0)
MCV: 100.8 fL — ABNORMAL HIGH (ref 80.0–100.0)
Platelets: 189 10*3/uL (ref 150–400)
RBC: 3.62 MIL/uL — ABNORMAL LOW (ref 4.22–5.81)
RDW: 13.6 % (ref 11.5–15.5)
WBC: 7.4 10*3/uL (ref 4.0–10.5)
nRBC: 0 % (ref 0.0–0.2)

## 2023-03-23 LAB — RESP PANEL BY RT-PCR (RSV, FLU A&B, COVID)  RVPGX2
Influenza A by PCR: NEGATIVE
Influenza B by PCR: NEGATIVE
Resp Syncytial Virus by PCR: NEGATIVE
SARS Coronavirus 2 by RT PCR: NEGATIVE

## 2023-03-23 LAB — MAGNESIUM: Magnesium: 1.7 mg/dL (ref 1.7–2.4)

## 2023-03-23 LAB — LIPASE, BLOOD: Lipase: 52 U/L — ABNORMAL HIGH (ref 11–51)

## 2023-03-23 MED ORDER — SUCRALFATE 1 G PO TABS: 1.0000 g | ORAL_TABLET | Freq: Four times a day (QID) | ORAL | 1 refills | Status: DC

## 2023-03-23 MED ORDER — LACTATED RINGERS IV BOLUS
1000.0000 mL | Freq: Once | INTRAVENOUS | Status: AC
  Administered 2023-03-23: 1000 mL via INTRAVENOUS

## 2023-03-23 MED ORDER — ONDANSETRON HCL 4 MG/2ML IJ SOLN
4.0000 mg | Freq: Once | INTRAMUSCULAR | Status: AC
  Administered 2023-03-23: 4 mg via INTRAVENOUS
  Filled 2023-03-23: qty 2

## 2023-03-23 MED ORDER — ONDANSETRON 4 MG PO TBDP: 4.0000 mg | ORAL_TABLET | Freq: Three times a day (TID) | ORAL | 0 refills | Status: DC | PRN

## 2023-03-23 NOTE — Telephone Encounter (Signed)
FYI: Malachi Bonds called and Renae Fickle is ready to go to the hospital now even though he didn't want to go when you told him to. He is still vomiting so she is taking him to the ED.

## 2023-03-23 NOTE — ED Provider Notes (Signed)
[facility] Provider Note    Event Date/Time   First MD Initiated Contact with Patient 03/23/23 1204     (approximate)   History   Chief Complaint: Emesis   HPI  [name] [name] is a 77 y.o. male with a history of COPD, CKD, hypertension, GERD, diabetes who comes ED complaining of nausea and vomiting for the past 3 days.  Denies pain or fever.  Reviewing outside records, patient was recently hospitalized for AKI and vomiting.  He had ultrasound right upper quadrant and CT scan of the abdomen pelvis which showed signs of gastritis.  He was treated with hydration and antiemetics, and symptoms resolved and he was discharged home.  He was started on Protonix which he has been compliant with.  Denies hematemesis or black/bloody stool          Physical Exam   Triage Vital Signs: ED Triage Vitals  Encounter Vitals Group     BP 03/23/23 1134 128/84     Systolic BP Percentile --      Diastolic BP Percentile --      Pulse Rate 03/23/23 1134 78     Resp 03/23/23 1134 20     Temp 03/23/23 1134 98.4 F (36.9 C)     Temp Source 03/23/23 1134 Oral     SpO2 03/23/23 1134 95 %     Weight 03/23/23 1133 198 lb (89.8 kg)     Height 03/23/23 1133 5\' 3"  (1.6 m)     Head Circumference --      Peak Flow --      Pain Score 03/23/23 1133 0     Pain Loc --      Pain Education --      Exclude from Growth Chart --     Most recent vital signs: Vitals:   03/23/23 1134 03/23/23 1300  BP: 128/84 (!) 159/58  Pulse: 78 70  Resp: 20   Temp: 98.4 F (36.9 C)   SpO2: 95% 100%    General: Awake, no distress.  CV:  Good peripheral perfusion.  Regular rate and rhythm Resp:  Normal effort.  Clear to auscultation bilaterally Abd:  No distention.  Soft, nontender Other:  Dry oral mucosa   ED Results / Procedures / Treatments   Labs (all labs ordered are listed, but only abnormal results are displayed) Labs Reviewed  LIPASE, BLOOD - Abnormal; Notable  for the following components:      Result Value   Lipase 52 (*)    All other components within normal limits  COMPREHENSIVE METABOLIC PANEL - Abnormal; Notable for the following components:   CO2 20 (*)    Glucose, Bld 124 (*)    BUN 72 (*)    Creatinine, Ser 4.49 (*)    AST 14 (*)    GFR, Estimated 13 (*)    All other components within normal limits  CBC - Abnormal; Notable for the following components:   RBC 3.62 (*)    Hemoglobin 11.6 (*)    HCT 36.5 (*)    MCV 100.8 (*)    All other components within normal limits  URINALYSIS, ROUTINE W REFLEX MICROSCOPIC - Abnormal; Notable for the following components:   Color, Urine YELLOW (*)    APPearance HAZY (*)    Protein, ur 100 (*)    All other components within normal limits  RESP PANEL BY RT-PCR (RSV, FLU A&B, COVID)  RVPGX2  MAGNESIUM     EKG  RADIOLOGY    PROCEDURES:  Procedures   MEDICATIONS ORDERED IN ED: Medications  ondansetron (ZOFRAN) injection 4 mg (4 mg Intravenous Given 03/23/23 1237)  lactated ringers bolus 1,000 mL (1,000 mLs Intravenous New Bag/Given 03/23/23 1237)     IMPRESSION / MDM / ASSESSMENT AND PLAN / ED COURSE  I reviewed the triage vital signs and the nursing notes.  DDx: Dehydration, AKI, electrolyte abnormality, gastritis, pancreatitis, COVID, influenza  Patient's presentation is most consistent with acute presentation with potential threat to life or bodily function.  Patient presents with emesis, similar to last hospitalization which was attributable to gastritis.  Will give IV fluids and Zofran.  Labs overall reassuring with creatinine only slightly worse than his baseline stage IV CKD.  Repeat abdominal imaging would not be beneficial today with reassuring exam.  Doubt mesenteric ischemia/intestinal angina, aortic aneurysm, or dissection..   Clinical Course as of 03/23/23 1517  Fri Mar 23, 2023  1317 Pt reports feeling better. Sx resolved. Will PO trial. [PS]    Clinical  Course User Index [PS] Sharman Cheek, MD    ----------------------------------------- 3:16 PM on 03/23/2023 ----------------------------------------- Feeling better, tolerating oral intake.  Already has an appointment with his nephrologist in 4 days on Tuesday.  Stable for discharge.   FINAL CLINICAL IMPRESSION(S) / ED DIAGNOSES   Final diagnoses:  Acute gastritis without hemorrhage, unspecified gastritis type     Rx / DC Orders   ED Discharge Orders          Ordered    sucralfate (CARAFATE) 1 g tablet  4 times daily        03/23/23 1513    ondansetron (ZOFRAN-ODT) 4 MG disintegrating tablet  Every 8 hours PRN        03/23/23 1513             Note:  This document was prepared using Dragon voice recognition software and may include unintentional dictation errors.   Sharman Cheek, MD 03/23/23 [phone]

## 2023-03-23 NOTE — ED Notes (Signed)
Pt given gingerale and crackers 

## 2023-03-23 NOTE — ED Triage Notes (Addendum)
Pt presents with N/V/D that started 4 days ago. Pt denies abd pain or blood in emesis or stool. Pt states he had a similar bacterial illness one month prior.   Pt with O2 3L at baseline.

## 2023-03-27 ENCOUNTER — Other Ambulatory Visit: Payer: Self-pay | Admitting: Family

## 2023-03-27 DIAGNOSIS — D631 Anemia in chronic kidney disease: Secondary | ICD-10-CM | POA: Diagnosis not present

## 2023-03-27 DIAGNOSIS — N2581 Secondary hyperparathyroidism of renal origin: Secondary | ICD-10-CM | POA: Diagnosis not present

## 2023-03-27 DIAGNOSIS — N050 Unspecified nephritic syndrome with minor glomerular abnormality: Secondary | ICD-10-CM | POA: Diagnosis not present

## 2023-03-27 DIAGNOSIS — I129 Hypertensive chronic kidney disease with stage 1 through stage 4 chronic kidney disease, or unspecified chronic kidney disease: Secondary | ICD-10-CM | POA: Diagnosis not present

## 2023-03-27 DIAGNOSIS — E1122 Type 2 diabetes mellitus with diabetic chronic kidney disease: Secondary | ICD-10-CM | POA: Diagnosis not present

## 2023-03-27 DIAGNOSIS — R809 Proteinuria, unspecified: Secondary | ICD-10-CM | POA: Diagnosis not present

## 2023-03-27 DIAGNOSIS — N184 Chronic kidney disease, stage 4 (severe): Secondary | ICD-10-CM | POA: Diagnosis not present

## 2023-03-30 DIAGNOSIS — H903 Sensorineural hearing loss, bilateral: Secondary | ICD-10-CM | POA: Diagnosis not present

## 2023-03-31 ENCOUNTER — Encounter: Payer: Self-pay | Admitting: Family

## 2023-04-03 DIAGNOSIS — M1712 Unilateral primary osteoarthritis, left knee: Secondary | ICD-10-CM | POA: Diagnosis not present

## 2023-04-03 DIAGNOSIS — M10362 Gout due to renal impairment, left knee: Secondary | ICD-10-CM | POA: Diagnosis not present

## 2023-04-04 ENCOUNTER — Ambulatory Visit: Payer: Medicare HMO | Admitting: Family

## 2023-04-07 ENCOUNTER — Other Ambulatory Visit: Payer: Self-pay | Admitting: Internal Medicine

## 2023-04-07 DIAGNOSIS — R0602 Shortness of breath: Secondary | ICD-10-CM

## 2023-04-10 ENCOUNTER — Other Ambulatory Visit: Payer: Self-pay | Admitting: Internal Medicine

## 2023-04-11 ENCOUNTER — Other Ambulatory Visit: Payer: Self-pay | Admitting: Family

## 2023-04-13 DIAGNOSIS — H353211 Exudative age-related macular degeneration, right eye, with active choroidal neovascularization: Secondary | ICD-10-CM | POA: Diagnosis not present

## 2023-04-21 ENCOUNTER — Other Ambulatory Visit: Payer: Self-pay

## 2023-04-21 ENCOUNTER — Inpatient Hospital Stay
Admission: EM | Admit: 2023-04-21 | Discharge: 2023-04-28 | DRG: 291 | Disposition: A | Discharge status: Home / Self Care | Payer: Medicare HMO | Attending: Internal Medicine | Admitting: Internal Medicine

## 2023-04-21 DIAGNOSIS — D509 Iron deficiency anemia, unspecified: Secondary | ICD-10-CM | POA: Diagnosis present

## 2023-04-21 DIAGNOSIS — M6282 Rhabdomyolysis: Secondary | ICD-10-CM | POA: Diagnosis not present

## 2023-04-21 DIAGNOSIS — E1122 Type 2 diabetes mellitus with diabetic chronic kidney disease: Secondary | ICD-10-CM

## 2023-04-21 DIAGNOSIS — R0602 Shortness of breath: Secondary | ICD-10-CM | POA: Diagnosis present

## 2023-04-21 DIAGNOSIS — N179 Acute kidney failure, unspecified: Secondary | ICD-10-CM | POA: Diagnosis present

## 2023-04-21 DIAGNOSIS — Z888 Allergy status to other drugs, medicaments and biological substances status: Secondary | ICD-10-CM

## 2023-04-21 DIAGNOSIS — K219 Gastro-esophageal reflux disease without esophagitis: Secondary | ICD-10-CM | POA: Diagnosis not present

## 2023-04-21 DIAGNOSIS — Z1152 Encounter for screening for COVID-19: Secondary | ICD-10-CM | POA: Diagnosis not present

## 2023-04-21 DIAGNOSIS — I5023 Acute on chronic systolic (congestive) heart failure: Secondary | ICD-10-CM | POA: Diagnosis not present

## 2023-04-21 DIAGNOSIS — Z7951 Long term (current) use of inhaled steroids: Secondary | ICD-10-CM

## 2023-04-21 DIAGNOSIS — I5043 Acute on chronic combined systolic (congestive) and diastolic (congestive) heart failure: Secondary | ICD-10-CM | POA: Diagnosis present

## 2023-04-21 DIAGNOSIS — E1140 Type 2 diabetes mellitus with diabetic neuropathy, unspecified: Secondary | ICD-10-CM | POA: Diagnosis present

## 2023-04-21 DIAGNOSIS — B955 Unspecified streptococcus as the cause of diseases classified elsewhere: Secondary | ICD-10-CM | POA: Diagnosis not present

## 2023-04-21 DIAGNOSIS — Z886 Allergy status to analgesic agent status: Secondary | ICD-10-CM

## 2023-04-21 DIAGNOSIS — I088 Other rheumatic multiple valve diseases: Secondary | ICD-10-CM | POA: Diagnosis not present

## 2023-04-21 DIAGNOSIS — I130 Hypertensive heart and chronic kidney disease with heart failure and stage 1 through stage 4 chronic kidney disease, or unspecified chronic kidney disease: Secondary | ICD-10-CM | POA: Diagnosis present

## 2023-04-21 DIAGNOSIS — A491 Streptococcal infection, unspecified site: Secondary | ICD-10-CM | POA: Diagnosis present

## 2023-04-21 DIAGNOSIS — Z8639 Personal history of other endocrine, nutritional and metabolic disease: Secondary | ICD-10-CM

## 2023-04-21 DIAGNOSIS — D631 Anemia in chronic kidney disease: Secondary | ICD-10-CM | POA: Diagnosis present

## 2023-04-21 DIAGNOSIS — Z85828 Personal history of other malignant neoplasm of skin: Secondary | ICD-10-CM

## 2023-04-21 DIAGNOSIS — F419 Anxiety disorder, unspecified: Secondary | ICD-10-CM | POA: Insufficient documentation

## 2023-04-21 DIAGNOSIS — Z825 Family history of asthma and other chronic lower respiratory diseases: Secondary | ICD-10-CM

## 2023-04-21 DIAGNOSIS — D696 Thrombocytopenia, unspecified: Secondary | ICD-10-CM | POA: Diagnosis present

## 2023-04-21 DIAGNOSIS — Z9981 Dependence on supplemental oxygen: Secondary | ICD-10-CM

## 2023-04-21 DIAGNOSIS — I509 Heart failure, unspecified: Secondary | ICD-10-CM | POA: Diagnosis not present

## 2023-04-21 DIAGNOSIS — F32A Depression, unspecified: Secondary | ICD-10-CM | POA: Diagnosis present

## 2023-04-21 DIAGNOSIS — E669 Obesity, unspecified: Secondary | ICD-10-CM | POA: Diagnosis present

## 2023-04-21 DIAGNOSIS — R7881 Bacteremia: Secondary | ICD-10-CM | POA: Diagnosis present

## 2023-04-21 DIAGNOSIS — E039 Hypothyroidism, unspecified: Secondary | ICD-10-CM | POA: Diagnosis present

## 2023-04-21 DIAGNOSIS — E875 Hyperkalemia: Secondary | ICD-10-CM | POA: Diagnosis present

## 2023-04-21 DIAGNOSIS — Z794 Long term (current) use of insulin: Secondary | ICD-10-CM | POA: Diagnosis not present

## 2023-04-21 DIAGNOSIS — Z87891 Personal history of nicotine dependence: Secondary | ICD-10-CM | POA: Diagnosis not present

## 2023-04-21 DIAGNOSIS — Z0389 Encounter for observation for other suspected diseases and conditions ruled out: Secondary | ICD-10-CM | POA: Diagnosis not present

## 2023-04-21 DIAGNOSIS — J449 Chronic obstructive pulmonary disease, unspecified: Secondary | ICD-10-CM | POA: Diagnosis not present

## 2023-04-21 DIAGNOSIS — I2510 Atherosclerotic heart disease of native coronary artery without angina pectoris: Secondary | ICD-10-CM | POA: Diagnosis present

## 2023-04-21 DIAGNOSIS — J9601 Acute respiratory failure with hypoxia: Secondary | ICD-10-CM | POA: Diagnosis not present

## 2023-04-21 DIAGNOSIS — I051 Rheumatic mitral insufficiency: Secondary | ICD-10-CM | POA: Diagnosis present

## 2023-04-21 DIAGNOSIS — Z7982 Long term (current) use of aspirin: Secondary | ICD-10-CM

## 2023-04-21 DIAGNOSIS — Z818 Family history of other mental and behavioral disorders: Secondary | ICD-10-CM

## 2023-04-21 DIAGNOSIS — Z952 Presence of prosthetic heart valve: Secondary | ICD-10-CM | POA: Diagnosis not present

## 2023-04-21 DIAGNOSIS — E785 Hyperlipidemia, unspecified: Secondary | ICD-10-CM | POA: Diagnosis present

## 2023-04-21 DIAGNOSIS — M1990 Unspecified osteoarthritis, unspecified site: Secondary | ICD-10-CM | POA: Diagnosis present

## 2023-04-21 DIAGNOSIS — M1A9XX0 Chronic gout, unspecified, without tophus (tophi): Secondary | ICD-10-CM | POA: Diagnosis present

## 2023-04-21 DIAGNOSIS — Z833 Family history of diabetes mellitus: Secondary | ICD-10-CM

## 2023-04-21 DIAGNOSIS — B954 Other streptococcus as the cause of diseases classified elsewhere: Secondary | ICD-10-CM | POA: Diagnosis not present

## 2023-04-21 DIAGNOSIS — N185 Chronic kidney disease, stage 5: Secondary | ICD-10-CM | POA: Diagnosis present

## 2023-04-21 DIAGNOSIS — I700 Atherosclerosis of aorta: Secondary | ICD-10-CM | POA: Diagnosis not present

## 2023-04-21 DIAGNOSIS — I10 Essential (primary) hypertension: Secondary | ICD-10-CM | POA: Diagnosis present

## 2023-04-21 DIAGNOSIS — J9621 Acute and chronic respiratory failure with hypoxia: Secondary | ICD-10-CM | POA: Diagnosis present

## 2023-04-21 DIAGNOSIS — J9600 Acute respiratory failure, unspecified whether with hypoxia or hypercapnia: Secondary | ICD-10-CM | POA: Diagnosis not present

## 2023-04-21 DIAGNOSIS — J441 Chronic obstructive pulmonary disease with (acute) exacerbation: Secondary | ICD-10-CM | POA: Diagnosis present

## 2023-04-21 DIAGNOSIS — Z8261 Family history of arthritis: Secondary | ICD-10-CM

## 2023-04-21 DIAGNOSIS — M109 Gout, unspecified: Secondary | ICD-10-CM | POA: Insufficient documentation

## 2023-04-21 DIAGNOSIS — N184 Chronic kidney disease, stage 4 (severe): Secondary | ICD-10-CM | POA: Diagnosis present

## 2023-04-21 DIAGNOSIS — I38 Endocarditis, valve unspecified: Secondary | ICD-10-CM | POA: Diagnosis not present

## 2023-04-21 DIAGNOSIS — Z79899 Other long term (current) drug therapy: Secondary | ICD-10-CM

## 2023-04-21 DIAGNOSIS — D123 Benign neoplasm of transverse colon: Secondary | ICD-10-CM | POA: Diagnosis present

## 2023-04-21 DIAGNOSIS — D124 Benign neoplasm of descending colon: Secondary | ICD-10-CM | POA: Diagnosis present

## 2023-04-21 DIAGNOSIS — I517 Cardiomegaly: Secondary | ICD-10-CM | POA: Diagnosis not present

## 2023-04-21 DIAGNOSIS — Z6835 Body mass index (BMI) 35.0-35.9, adult: Secondary | ICD-10-CM

## 2023-04-21 DIAGNOSIS — Z8249 Family history of ischemic heart disease and other diseases of the circulatory system: Secondary | ICD-10-CM

## 2023-04-21 DIAGNOSIS — N400 Enlarged prostate without lower urinary tract symptoms: Secondary | ICD-10-CM | POA: Diagnosis present

## 2023-04-21 DIAGNOSIS — K5730 Diverticulosis of large intestine without perforation or abscess without bleeding: Secondary | ICD-10-CM | POA: Diagnosis present

## 2023-04-21 DIAGNOSIS — G4733 Obstructive sleep apnea (adult) (pediatric): Secondary | ICD-10-CM | POA: Diagnosis present

## 2023-04-21 DIAGNOSIS — Z8673 Personal history of transient ischemic attack (TIA), and cerebral infarction without residual deficits: Secondary | ICD-10-CM

## 2023-04-21 DIAGNOSIS — Z8701 Personal history of pneumonia (recurrent): Secondary | ICD-10-CM

## 2023-04-21 MED ORDER — ACETAMINOPHEN 500 MG PO TABS
1000.0000 mg | ORAL_TABLET | Freq: Once | ORAL | Status: AC
  Administered 2023-04-22: 1000 mg via ORAL
  Filled 2023-04-21: qty 2

## 2023-04-21 NOTE — ED Notes (Signed)
Triage to be completed in room

## 2023-04-21 NOTE — ED Triage Notes (Signed)
Pt reports SOB. Pt was seen by EMS at home for same and NRB placed. Pt refused transport at home. Pt arrives with NRB in place and reports continued SOB. Pt is dsypneic and with shallow respirations speaking in very short sentences. Triage bypassed and pt taken immediately to room. Charge RN And MD notified. Staff met pt in room.

## 2023-04-21 NOTE — ED Provider Notes (Signed)
[name] [name] Provider Note    Event Date/Time   First MD Initiated Contact with Patient 04/21/23 2354     (approximate)   History   Shortness of Breath   HPI  [name] [name] is a 77 y.o. male who presents to the ED for evaluation of Shortness of Breath   Review of medical DC summary from October.  Obese patient with history of COPD, DM, TAVR, dCHF, CKD.  Chronic respiratory failure on 3 L at home.  Baseline GFR around 14  Patient presents to the ED by POV for evaluation of shortness of breath rapidly progressive over the past few hours today.  Daughter and wife at the bedside.  They provide majority of history.  Daughter reports she was normal on Christmas.  Symptoms started throughout the night tonight with shortness of breath and chills at home.   Physical Exam   Triage Vital Signs: ED Triage Vitals  Encounter Vitals Group     BP --      Systolic BP Percentile --      Diastolic BP Percentile --      Pulse Rate 04/21/23 2353 95     Resp 04/21/23 2353 (!) 24     Temp 04/21/23 2353 (!) 100.6 F (38.1 C)     Temp Source 04/21/23 2353 Axillary     SpO2 04/21/23 2353 100 %     Weight --      Height --      Head Circumference --      Peak Flow --      Pain Score 04/21/23 2350 0     Pain Loc --      Pain Education --      Exclude from Growth Chart --     Most recent vital signs: Vitals:   04/21/23 2353 04/21/23 2355  BP:  (!) 164/121  Pulse: 95   Resp: (!) 24 18  Temp: (!) 100.6 F (38.1 C)   SpO2: 100%     General: Awake.  Nonrebreather, looks unwell, tripoding, warm to the touch and febrile CV:  Good peripheral perfusion.  Resp:  Normal effort.  Abd:  No distention.  MSK:  No deformity noted.  Neuro:  No focal deficits appreciated. Other:     ED Results / Procedures / Treatments   Labs (all labs ordered are listed, but only abnormal results are displayed) Labs Reviewed  RESP PANEL BY RT-PCR (RSV, FLU A&B, COVID)   RVPGX2  CULTURE, BLOOD (ROUTINE X 2)  CULTURE, BLOOD (ROUTINE X 2)  PROCALCITONIN  LACTIC ACID, PLASMA  LACTIC ACID, PLASMA  COMPREHENSIVE METABOLIC PANEL  CBC WITH DIFFERENTIAL/PLATELET  PROTIME-INR  APTT  URINALYSIS, ROUTINE W REFLEX MICROSCOPIC  TROPONIN I (HIGH SENSITIVITY)    EKG ***  RADIOLOGY ***  Official radiology report(s): No results found.  PROCEDURES and INTERVENTIONS:  Procedures  Medications  acetaminophen (TYLENOL) tablet 1,000 mg (has no administration in time range)     IMPRESSION / MDM / ASSESSMENT AND PLAN / ED COURSE  I reviewed the triage vital signs and the nursing notes.  Differential diagnosis includes, but is not limited to, ***  {Patient presents with symptoms of an acute illness or injury that is potentially life-threatening.}      FINAL CLINICAL IMPRESSION(S) / ED DIAGNOSES   Final diagnoses:  None     Rx / DC Orders   ED Discharge Orders     None        Note:  This document was prepared using Dragon voice recognition software and may include unintentional dictation errors.

## 2023-04-22 ENCOUNTER — Other Ambulatory Visit: Payer: Self-pay

## 2023-04-22 ENCOUNTER — Emergency Department: Payer: Medicare HMO

## 2023-04-22 DIAGNOSIS — E669 Obesity, unspecified: Secondary | ICD-10-CM | POA: Diagnosis present

## 2023-04-22 DIAGNOSIS — N184 Chronic kidney disease, stage 4 (severe): Secondary | ICD-10-CM

## 2023-04-22 DIAGNOSIS — J441 Chronic obstructive pulmonary disease with (acute) exacerbation: Secondary | ICD-10-CM | POA: Diagnosis present

## 2023-04-22 DIAGNOSIS — J9601 Acute respiratory failure with hypoxia: Secondary | ICD-10-CM | POA: Diagnosis not present

## 2023-04-22 DIAGNOSIS — E1122 Type 2 diabetes mellitus with diabetic chronic kidney disease: Secondary | ICD-10-CM

## 2023-04-22 DIAGNOSIS — I509 Heart failure, unspecified: Secondary | ICD-10-CM | POA: Diagnosis not present

## 2023-04-22 DIAGNOSIS — I517 Cardiomegaly: Secondary | ICD-10-CM | POA: Diagnosis not present

## 2023-04-22 DIAGNOSIS — F419 Anxiety disorder, unspecified: Secondary | ICD-10-CM | POA: Insufficient documentation

## 2023-04-22 DIAGNOSIS — I5023 Acute on chronic systolic (congestive) heart failure: Secondary | ICD-10-CM | POA: Diagnosis not present

## 2023-04-22 DIAGNOSIS — J9621 Acute and chronic respiratory failure with hypoxia: Secondary | ICD-10-CM | POA: Diagnosis present

## 2023-04-22 DIAGNOSIS — K219 Gastro-esophageal reflux disease without esophagitis: Secondary | ICD-10-CM | POA: Diagnosis not present

## 2023-04-22 DIAGNOSIS — E785 Hyperlipidemia, unspecified: Secondary | ICD-10-CM | POA: Diagnosis present

## 2023-04-22 DIAGNOSIS — Z794 Long term (current) use of insulin: Secondary | ICD-10-CM | POA: Diagnosis not present

## 2023-04-22 DIAGNOSIS — E039 Hypothyroidism, unspecified: Secondary | ICD-10-CM | POA: Diagnosis present

## 2023-04-22 DIAGNOSIS — I5043 Acute on chronic combined systolic (congestive) and diastolic (congestive) heart failure: Secondary | ICD-10-CM | POA: Diagnosis present

## 2023-04-22 DIAGNOSIS — E1140 Type 2 diabetes mellitus with diabetic neuropathy, unspecified: Secondary | ICD-10-CM | POA: Diagnosis present

## 2023-04-22 DIAGNOSIS — Z0389 Encounter for observation for other suspected diseases and conditions ruled out: Secondary | ICD-10-CM | POA: Diagnosis not present

## 2023-04-22 DIAGNOSIS — F32A Depression, unspecified: Secondary | ICD-10-CM | POA: Insufficient documentation

## 2023-04-22 DIAGNOSIS — M109 Gout, unspecified: Secondary | ICD-10-CM | POA: Insufficient documentation

## 2023-04-22 DIAGNOSIS — Z87891 Personal history of nicotine dependence: Secondary | ICD-10-CM | POA: Diagnosis not present

## 2023-04-22 DIAGNOSIS — N400 Enlarged prostate without lower urinary tract symptoms: Secondary | ICD-10-CM | POA: Diagnosis not present

## 2023-04-22 DIAGNOSIS — I088 Other rheumatic multiple valve diseases: Secondary | ICD-10-CM | POA: Diagnosis not present

## 2023-04-22 DIAGNOSIS — J9600 Acute respiratory failure, unspecified whether with hypoxia or hypercapnia: Secondary | ICD-10-CM | POA: Diagnosis present

## 2023-04-22 DIAGNOSIS — R0602 Shortness of breath: Secondary | ICD-10-CM | POA: Diagnosis present

## 2023-04-22 DIAGNOSIS — Z952 Presence of prosthetic heart valve: Secondary | ICD-10-CM | POA: Diagnosis not present

## 2023-04-22 DIAGNOSIS — G4733 Obstructive sleep apnea (adult) (pediatric): Secondary | ICD-10-CM | POA: Diagnosis present

## 2023-04-22 DIAGNOSIS — D696 Thrombocytopenia, unspecified: Secondary | ICD-10-CM | POA: Diagnosis present

## 2023-04-22 DIAGNOSIS — D631 Anemia in chronic kidney disease: Secondary | ICD-10-CM | POA: Diagnosis present

## 2023-04-22 DIAGNOSIS — Z9981 Dependence on supplemental oxygen: Secondary | ICD-10-CM | POA: Diagnosis not present

## 2023-04-22 DIAGNOSIS — E875 Hyperkalemia: Secondary | ICD-10-CM | POA: Diagnosis not present

## 2023-04-22 DIAGNOSIS — R7881 Bacteremia: Secondary | ICD-10-CM | POA: Diagnosis present

## 2023-04-22 DIAGNOSIS — B955 Unspecified streptococcus as the cause of diseases classified elsewhere: Secondary | ICD-10-CM | POA: Diagnosis not present

## 2023-04-22 DIAGNOSIS — I10 Essential (primary) hypertension: Secondary | ICD-10-CM

## 2023-04-22 DIAGNOSIS — D509 Iron deficiency anemia, unspecified: Secondary | ICD-10-CM | POA: Diagnosis present

## 2023-04-22 DIAGNOSIS — N179 Acute kidney failure, unspecified: Secondary | ICD-10-CM | POA: Diagnosis present

## 2023-04-22 DIAGNOSIS — B954 Other streptococcus as the cause of diseases classified elsewhere: Secondary | ICD-10-CM | POA: Diagnosis not present

## 2023-04-22 DIAGNOSIS — I130 Hypertensive heart and chronic kidney disease with heart failure and stage 1 through stage 4 chronic kidney disease, or unspecified chronic kidney disease: Secondary | ICD-10-CM | POA: Diagnosis present

## 2023-04-22 DIAGNOSIS — J449 Chronic obstructive pulmonary disease, unspecified: Secondary | ICD-10-CM | POA: Diagnosis not present

## 2023-04-22 DIAGNOSIS — Z1152 Encounter for screening for COVID-19: Secondary | ICD-10-CM | POA: Diagnosis not present

## 2023-04-22 DIAGNOSIS — M6282 Rhabdomyolysis: Secondary | ICD-10-CM | POA: Diagnosis not present

## 2023-04-22 DIAGNOSIS — A491 Streptococcal infection, unspecified site: Secondary | ICD-10-CM | POA: Diagnosis present

## 2023-04-22 DIAGNOSIS — I38 Endocarditis, valve unspecified: Secondary | ICD-10-CM | POA: Diagnosis not present

## 2023-04-22 DIAGNOSIS — I2510 Atherosclerotic heart disease of native coronary artery without angina pectoris: Secondary | ICD-10-CM | POA: Diagnosis present

## 2023-04-22 DIAGNOSIS — I700 Atherosclerosis of aorta: Secondary | ICD-10-CM | POA: Diagnosis not present

## 2023-04-22 HISTORY — DX: Acute and chronic respiratory failure with hypoxia: J96.21

## 2023-04-22 LAB — PROTIME-INR
INR: 1.1 (ref 0.8–1.2)
Prothrombin Time: 14 s (ref 11.4–15.2)

## 2023-04-22 LAB — URINALYSIS, ROUTINE W REFLEX MICROSCOPIC
Bilirubin Urine: NEGATIVE
Glucose, UA: NEGATIVE mg/dL
Hgb urine dipstick: NEGATIVE
Ketones, ur: NEGATIVE mg/dL
Leukocytes,Ua: NEGATIVE
Nitrite: NEGATIVE
Protein, ur: >=300 mg/dL — AB
Specific Gravity, Urine: 1.013 (ref 1.005–1.030)
pH: 5 (ref 5.0–8.0)

## 2023-04-22 LAB — CBC
HCT: 30.8 % — ABNORMAL LOW (ref 39.0–52.0)
Hemoglobin: 9.8 g/dL — ABNORMAL LOW (ref 13.0–17.0)
MCH: 31.9 pg (ref 26.0–34.0)
MCHC: 31.8 g/dL (ref 30.0–36.0)
MCV: 100.3 fL — ABNORMAL HIGH (ref 80.0–100.0)
Platelets: 137 10*3/uL — ABNORMAL LOW (ref 150–400)
RBC: 3.07 MIL/uL — ABNORMAL LOW (ref 4.22–5.81)
RDW: 13.3 % (ref 11.5–15.5)
WBC: 8.1 10*3/uL (ref 4.0–10.5)
nRBC: 0 % (ref 0.0–0.2)

## 2023-04-22 LAB — CBC WITH DIFFERENTIAL/PLATELET
Abs Immature Granulocytes: 0.15 10*3/uL — ABNORMAL HIGH (ref 0.00–0.07)
Basophils Absolute: 0 10*3/uL (ref 0.0–0.1)
Basophils Relative: 0 %
Eosinophils Absolute: 0 10*3/uL (ref 0.0–0.5)
Eosinophils Relative: 0 %
HCT: 36 % — ABNORMAL LOW (ref 39.0–52.0)
Hemoglobin: 11.7 g/dL — ABNORMAL LOW (ref 13.0–17.0)
Immature Granulocytes: 2 %
Lymphocytes Relative: 7 %
Lymphs Abs: 0.5 10*3/uL — ABNORMAL LOW (ref 0.7–4.0)
MCH: 32.1 pg (ref 26.0–34.0)
MCHC: 32.5 g/dL (ref 30.0–36.0)
MCV: 98.9 fL (ref 80.0–100.0)
Monocytes Absolute: 0.5 10*3/uL (ref 0.1–1.0)
Monocytes Relative: 6 %
Neutro Abs: 6.1 10*3/uL (ref 1.7–7.7)
Neutrophils Relative %: 85 %
Platelets: 158 10*3/uL (ref 150–400)
RBC: 3.64 MIL/uL — ABNORMAL LOW (ref 4.22–5.81)
RDW: 13.3 % (ref 11.5–15.5)
WBC: 7.3 10*3/uL (ref 4.0–10.5)
nRBC: 0 % (ref 0.0–0.2)

## 2023-04-22 LAB — BLOOD CULTURE ID PANEL (REFLEXED) - BCID2

## 2023-04-22 LAB — RESP PANEL BY RT-PCR (RSV, FLU A&B, COVID)  RVPGX2
Influenza A by PCR: NEGATIVE
Influenza B by PCR: NEGATIVE
Resp Syncytial Virus by PCR: NEGATIVE
SARS Coronavirus 2 by RT PCR: NEGATIVE

## 2023-04-22 LAB — COMPREHENSIVE METABOLIC PANEL
ALT: 15 U/L (ref 0–44)
AST: 19 U/L (ref 15–41)
Albumin: 3.3 g/dL — ABNORMAL LOW (ref 3.5–5.0)
Alkaline Phosphatase: 66 U/L (ref 38–126)
Anion gap: 13 (ref 5–15)
BUN: 76 mg/dL — ABNORMAL HIGH (ref 8–23)
CO2: 26 mmol/L (ref 22–32)
Calcium: 8.5 mg/dL — ABNORMAL LOW (ref 8.9–10.3)
Chloride: 99 mmol/L (ref 98–111)
Creatinine, Ser: 3.49 mg/dL — ABNORMAL HIGH (ref 0.61–1.24)
GFR, Estimated: 17 mL/min — ABNORMAL LOW (ref >60)
Glucose, Bld: 132 mg/dL — ABNORMAL HIGH (ref 70–99)
Potassium: 4.7 mmol/L (ref 3.5–5.1)
Sodium: 138 mmol/L (ref 135–145)
Total Bilirubin: 0.7 mg/dL (ref <1.2)
Total Protein: 6.9 g/dL (ref 6.5–8.1)

## 2023-04-22 LAB — TROPONIN I (HIGH SENSITIVITY)
Troponin I (High Sensitivity): 17 ng/L (ref <18)
Troponin I (High Sensitivity): 29 ng/L — ABNORMAL HIGH (ref <18)

## 2023-04-22 LAB — LACTIC ACID, PLASMA
Lactic Acid, Venous: 1 mmol/L (ref 0.5–1.9)
Lactic Acid, Venous: 2.3 mmol/L (ref 0.5–1.9)

## 2023-04-22 LAB — CBG MONITORING, ED
Glucose-Capillary: 167 mg/dL — ABNORMAL HIGH (ref 70–99)
Glucose-Capillary: 257 mg/dL — ABNORMAL HIGH (ref 70–99)
Glucose-Capillary: 115 mg/dL — ABNORMAL HIGH (ref 70–99)
Glucose-Capillary: 171 mg/dL — ABNORMAL HIGH (ref 70–99)

## 2023-04-22 LAB — BASIC METABOLIC PANEL
Anion gap: 14 (ref 5–15)
BUN: 79 mg/dL — ABNORMAL HIGH (ref 8–23)
CO2: 22 mmol/L (ref 22–32)
Calcium: 7.8 mg/dL — ABNORMAL LOW (ref 8.9–10.3)
Chloride: 102 mmol/L (ref 98–111)
Creatinine, Ser: 3.41 mg/dL — ABNORMAL HIGH (ref 0.61–1.24)
GFR, Estimated: 18 mL/min — ABNORMAL LOW (ref >60)
Glucose, Bld: 175 mg/dL — ABNORMAL HIGH (ref 70–99)
Potassium: 4.5 mmol/L (ref 3.5–5.1)
Sodium: 138 mmol/L (ref 135–145)

## 2023-04-22 LAB — BRAIN NATRIURETIC PEPTIDE: B Natriuretic Peptide: 820.9 pg/mL — ABNORMAL HIGH (ref 0.0–100.0)

## 2023-04-22 LAB — APTT: aPTT: 29 s (ref 24–36)

## 2023-04-22 LAB — PROCALCITONIN: Procalcitonin: 0.22 ng/mL

## 2023-04-22 MED ORDER — GABAPENTIN 400 MG PO CAPS
400.0000 mg | ORAL_CAPSULE | Freq: Every day | ORAL | Status: DC
  Administered 2023-04-22 – 2023-04-27 (×6): 400 mg via ORAL
  Filled 2023-04-22 (×3): qty 1
  Filled 2023-04-22 (×2): qty 4
  Filled 2023-04-22 (×3): qty 1
  Filled 2023-04-22 (×2): qty 4

## 2023-04-22 MED ORDER — SODIUM CHLORIDE 0.9 % IV SOLN
2.0000 g | INTRAVENOUS | Status: DC
  Administered 2023-04-23 – 2023-04-28 (×5): 2 g via INTRAVENOUS
  Filled 2023-04-22 (×6): qty 20

## 2023-04-22 MED ORDER — METHYLPREDNISOLONE SODIUM SUCC 125 MG IJ SOLR
125.0000 mg | Freq: Once | INTRAMUSCULAR | Status: AC
  Administered 2023-04-22: 125 mg via INTRAVENOUS
  Filled 2023-04-22: qty 2

## 2023-04-22 MED ORDER — PREDNISONE 20 MG PO TABS
40.0000 mg | ORAL_TABLET | Freq: Every day | ORAL | Status: DC
  Administered 2023-04-23 – 2023-04-25 (×3): 40 mg via ORAL
  Filled 2023-04-22 (×3): qty 2

## 2023-04-22 MED ORDER — ONDANSETRON HCL 4 MG/2ML IJ SOLN: 4.0000 mg | Freq: Once | INTRAMUSCULAR | Status: AC

## 2023-04-22 MED ORDER — BUSPIRONE HCL 10 MG PO TABS
5.0000 mg | ORAL_TABLET | Freq: Two times a day (BID) | ORAL | Status: DC | PRN
  Administered 2023-04-22: 5 mg via ORAL
  Filled 2023-04-22: qty 1

## 2023-04-22 MED ORDER — ONDANSETRON HCL 4 MG/2ML IJ SOLN: 4.0000 mg | Freq: Four times a day (QID) | INTRAMUSCULAR | Status: DC | PRN

## 2023-04-22 MED ORDER — IPRATROPIUM-ALBUTEROL 0.5-2.5 (3) MG/3ML IN SOLN
3.0000 mL | RESPIRATORY_TRACT | Status: DC | PRN
  Administered 2023-04-28: 3 mL via RESPIRATORY_TRACT
  Filled 2023-04-22: qty 3

## 2023-04-22 MED ORDER — CALCITRIOL 0.25 MCG PO CAPS
0.2500 ug | ORAL_CAPSULE | Freq: Every day | ORAL | Status: DC
  Administered 2023-04-22 – 2023-04-28 (×7): 0.25 ug via ORAL
  Filled 2023-04-22 (×7): qty 1

## 2023-04-22 MED ORDER — INSULIN ASPART 100 UNIT/ML IJ SOLN
0.0000 [IU] | Freq: Three times a day (TID) | INTRAMUSCULAR | Status: DC
Start: 2023-04-22 — End: 2023-04-28
  Administered 2023-04-22: 8 [IU] via SUBCUTANEOUS
  Administered 2023-04-22: 3 [IU] via SUBCUTANEOUS
  Administered 2023-04-23 (×2): 2 [IU] via SUBCUTANEOUS
  Administered 2023-04-24: 3 [IU] via SUBCUTANEOUS
  Administered 2023-04-24 – 2023-04-28 (×6): 2 [IU] via SUBCUTANEOUS
  Filled 2023-04-22 (×11): qty 1

## 2023-04-22 MED ORDER — HYDROCOD POLI-CHLORPHE POLI ER 10-8 MG/5ML PO SUER: 5.0000 mL | Freq: Two times a day (BID) | ORAL | Status: DC | PRN

## 2023-04-22 MED ORDER — LORATADINE 10 MG PO TABS: 10.0000 mg | ORAL_TABLET | Freq: Every day | ORAL | Status: DC | PRN

## 2023-04-22 MED ORDER — METHYLPREDNISOLONE SODIUM SUCC 40 MG IJ SOLR
40.0000 mg | Freq: Two times a day (BID) | INTRAMUSCULAR | Status: AC
  Administered 2023-04-22 (×2): 40 mg via INTRAVENOUS
  Filled 2023-04-22 (×2): qty 1

## 2023-04-22 MED ORDER — ACETAMINOPHEN 325 MG PO TABS
650.0000 mg | ORAL_TABLET | Freq: Four times a day (QID) | ORAL | Status: DC | PRN
  Administered 2023-04-22: 650 mg via ORAL
  Filled 2023-04-22: qty 2

## 2023-04-22 MED ORDER — SODIUM CHLORIDE 0.9 % IV SOLN
2.0000 g | Freq: Once | INTRAVENOUS | Status: AC
  Administered 2023-04-22: 2 g via INTRAVENOUS
  Filled 2023-04-22: qty 20

## 2023-04-22 MED ORDER — GUAIFENESIN ER 600 MG PO TB12
600.0000 mg | ORAL_TABLET | Freq: Two times a day (BID) | ORAL | Status: DC
  Administered 2023-04-22 – 2023-04-28 (×14): 600 mg via ORAL
  Filled 2023-04-22 (×14): qty 1

## 2023-04-22 MED ORDER — AZITHROMYCIN 500 MG IV SOLR
500.0000 mg | Freq: Once | INTRAVENOUS | Status: AC
  Administered 2023-04-22: 500 mg via INTRAVENOUS
  Filled 2023-04-22: qty 5

## 2023-04-22 MED ORDER — FUROSEMIDE 10 MG/ML IJ SOLN: 40.0000 mg | Freq: Every day | INTRAMUSCULAR | Status: DC

## 2023-04-22 MED ORDER — IPRATROPIUM-ALBUTEROL 0.5-2.5 (3) MG/3ML IN SOLN
3.0000 mL | Freq: Once | RESPIRATORY_TRACT | Status: AC
  Administered 2023-04-22: 3 mL via RESPIRATORY_TRACT
  Filled 2023-04-22: qty 3

## 2023-04-22 MED ORDER — MAGNESIUM HYDROXIDE 400 MG/5ML PO SUSP: 30.0000 mL | Freq: Every day | ORAL | Status: DC | PRN

## 2023-04-22 MED ORDER — ENOXAPARIN SODIUM 30 MG/0.3ML IJ SOSY
30.0000 mg | PREFILLED_SYRINGE | INTRAMUSCULAR | Status: DC
  Administered 2023-04-22 – 2023-04-27 (×6): 30 mg via SUBCUTANEOUS
  Filled 2023-04-22 (×6): qty 0.3

## 2023-04-22 MED ORDER — METOPROLOL SUCCINATE ER 50 MG PO TB24
50.0000 mg | ORAL_TABLET | Freq: Every day | ORAL | Status: DC
  Administered 2023-04-22 – 2023-04-28 (×7): 50 mg via ORAL
  Filled 2023-04-22 (×7): qty 1

## 2023-04-22 MED ORDER — ASPIRIN 81 MG PO TBEC
81.0000 mg | DELAYED_RELEASE_TABLET | Freq: Every day | ORAL | Status: DC
  Administered 2023-04-22 – 2023-04-27 (×6): 81 mg via ORAL
  Filled 2023-04-22 (×6): qty 1

## 2023-04-22 MED ORDER — FUROSEMIDE 10 MG/ML IJ SOLN
20.0000 mg | Freq: Once | INTRAMUSCULAR | Status: AC
  Administered 2023-04-22: 20 mg via INTRAVENOUS
  Filled 2023-04-22: qty 4

## 2023-04-22 MED ORDER — VITAMIN C 500 MG PO TABS
1000.0000 mg | ORAL_TABLET | Freq: Every day | ORAL | Status: DC
  Administered 2023-04-22 – 2023-04-27 (×6): 1000 mg via ORAL
  Filled 2023-04-22 (×6): qty 2

## 2023-04-22 MED ORDER — FEBUXOSTAT 40 MG PO TABS
40.0000 mg | ORAL_TABLET | Freq: Every day | ORAL | Status: DC
  Administered 2023-04-22 – 2023-04-28 (×7): 40 mg via ORAL
  Filled 2023-04-22 (×7): qty 1

## 2023-04-22 MED ORDER — PANTOPRAZOLE SODIUM 40 MG PO TBEC
40.0000 mg | DELAYED_RELEASE_TABLET | Freq: Every day | ORAL | Status: DC
  Administered 2023-04-22 – 2023-04-28 (×7): 40 mg via ORAL
  Filled 2023-04-22 (×7): qty 1

## 2023-04-22 MED ORDER — TAMSULOSIN HCL 0.4 MG PO CAPS
0.4000 mg | ORAL_CAPSULE | Freq: Every day | ORAL | Status: DC
  Administered 2023-04-22 – 2023-04-28 (×7): 0.4 mg via ORAL
  Filled 2023-04-22 (×7): qty 1

## 2023-04-22 MED ORDER — TRAZODONE HCL 50 MG PO TABS
25.0000 mg | ORAL_TABLET | Freq: Every evening | ORAL | Status: DC | PRN
  Administered 2023-04-22 – 2023-04-26 (×4): 25 mg via ORAL
  Filled 2023-04-22 (×4): qty 1

## 2023-04-22 MED ORDER — CITALOPRAM HYDROBROMIDE 20 MG PO TABS
40.0000 mg | ORAL_TABLET | Freq: Every day | ORAL | Status: DC
  Administered 2023-04-22 – 2023-04-28 (×7): 40 mg via ORAL
  Filled 2023-04-22 (×7): qty 2

## 2023-04-22 MED ORDER — COLCHICINE 0.6 MG PO TABS
0.6000 mg | ORAL_TABLET | Freq: Every day | ORAL | Status: DC
  Administered 2023-04-22 – 2023-04-23 (×2): 0.6 mg via ORAL
  Filled 2023-04-22 (×3): qty 1

## 2023-04-22 MED ORDER — ONDANSETRON HCL 4 MG PO TABS: 4.0000 mg | ORAL_TABLET | Freq: Four times a day (QID) | ORAL | Status: DC | PRN

## 2023-04-22 MED ORDER — METOCLOPRAMIDE HCL 5 MG/ML IJ SOLN
10.0000 mg | Freq: Once | INTRAMUSCULAR | Status: AC
  Administered 2023-04-22: 10 mg via INTRAVENOUS
  Filled 2023-04-22: qty 2

## 2023-04-22 MED ORDER — ONDANSETRON HCL 4 MG/2ML IJ SOLN
INTRAMUSCULAR | Status: AC
  Administered 2023-04-22: 4 mg via INTRAVENOUS
  Filled 2023-04-22: qty 2

## 2023-04-22 MED ORDER — SUCRALFATE 1 G PO TABS: 1.0000 g | ORAL_TABLET | Freq: Four times a day (QID) | ORAL | Status: DC

## 2023-04-22 MED ORDER — IPRATROPIUM-ALBUTEROL 0.5-2.5 (3) MG/3ML IN SOLN
3.0000 mL | Freq: Four times a day (QID) | RESPIRATORY_TRACT | Status: DC
  Administered 2023-04-22 – 2023-04-25 (×13): 3 mL via RESPIRATORY_TRACT
  Filled 2023-04-22 (×11): qty 3

## 2023-04-22 MED ORDER — PROSIGHT PO TABS
1.0000 | ORAL_TABLET | Freq: Two times a day (BID) | ORAL | Status: DC
  Administered 2023-04-22 – 2023-04-28 (×12): 1 via ORAL
  Filled 2023-04-22 (×14): qty 1

## 2023-04-22 MED ORDER — FUROSEMIDE 10 MG/ML IJ SOLN: 40.0000 mg | Freq: Two times a day (BID) | INTRAMUSCULAR | Status: DC

## 2023-04-22 MED ORDER — EZETIMIBE 10 MG PO TABS
10.0000 mg | ORAL_TABLET | Freq: Every day | ORAL | Status: DC
Start: 2023-04-22 — End: 2023-04-28
  Administered 2023-04-22 – 2023-04-26 (×5): 10 mg via ORAL
  Filled 2023-04-22 (×6): qty 1

## 2023-04-22 MED ORDER — ACETAMINOPHEN 650 MG RE SUPP: 650.0000 mg | Freq: Four times a day (QID) | RECTAL | Status: DC | PRN

## 2023-04-22 MED ORDER — SODIUM CHLORIDE 0.9 % IV SOLN: 1.0000 g | INTRAVENOUS | Status: DC

## 2023-04-22 MED ORDER — GABAPENTIN 100 MG PO CAPS
200.0000 mg | ORAL_CAPSULE | Freq: Every day | ORAL | Status: DC
  Administered 2023-04-22 – 2023-04-28 (×7): 200 mg via ORAL
  Filled 2023-04-22 (×7): qty 2

## 2023-04-22 MED ORDER — SODIUM CHLORIDE 0.9 % IV SOLN
500.0000 mg | INTRAVENOUS | Status: DC
  Administered 2023-04-23: 500 mg via INTRAVENOUS
  Filled 2023-04-22: qty 5

## 2023-04-22 MED ORDER — INSULIN ASPART 100 UNIT/ML IJ SOLN
0.0000 [IU] | Freq: Every day | INTRAMUSCULAR | Status: DC
  Administered 2023-04-23: 3 [IU] via SUBCUTANEOUS
  Administered 2023-04-26: 2 [IU] via SUBCUTANEOUS
  Filled 2023-04-22 (×2): qty 1

## 2023-04-22 NOTE — Progress Notes (Signed)
PROGRESS NOTE    [name] [name]  HKV:[id number] DOB: 1946-04-19 DOA: 04/21/2023 PCP: Miki Kins, FNP   Assessment & Plan:   Principal Problem:   Acute respiratory failure [facility] [place]) Active Problems:   Type 2 diabetes mellitus with chronic kidney disease, without long-term current use of insulin (HCC)   Essential hypertension   Dyslipidemia   BPH (benign prostatic hyperplasia)   Gout   Anxiety and depression   GERD without esophagitis  Assessment and Plan:  Acute hypoxic respiratory failure: likely secondary to COPD and  acute on chronic diastolic CHF exacerbation. Continue on azithromycin, bronchodilators & IV lasix. Dr. [name] was consulted as per pt's family request only.   Possible bacteremia: blood cxs growing gram positive cocci, sens pending. Continue on IV rocephin. Source unclear.    DM2: likely poorly controlled. Continue on SSI w/ accuchecks   Peripheral neuropathy: continue on home dose of gabapentin    HTN: continue on metoprolol,    HLD: continue on zetia    GERD: continue on PPI,    Depression: severity unknown. Continue on home dose of citalopram, buspar   Gout: continue on colchicine, febuxostat   BPH: continue on flomax   Thrombocytopenia: etiology unclear. Will continue to monitor   Obesity: BMI 35.2. Would benefit from weight loss    DVT prophylaxis: lovenox  Code Status: full  Family Communication: discussed pt's care w/ pt's family at bedside and answered their questions  Disposition Plan: depends on PT/OT recs   Level of care: Progressive  Status is: Inpatient Remains inpatient appropriate because: severity of illness    Consultants:  Cardio   Procedures:   Antimicrobials: azithromycin, rocephin  Subjective: Pt c/o fatigue  Objective: Vitals:   04/22/23 0500 04/22/23 0600 04/22/23 0730 04/22/23 0800  BP: 134/63 (!) 134/56 (!) 136/50 (!) 141/54  Pulse: 70 65 61 60  Resp: 18 (!) 26 17 (!) 27  Temp:       TempSrc:      SpO2: 96% 95% 98% 98%  Weight:        Intake/Output Summary (Last 24 hours) at 04/22/2023 0852 Last data filed at 04/22/2023 0321 Gross per 24 hour  Intake 350 ml  Output --  Net 350 ml   Filed Weights   04/22/23 0132  Weight: 90.3 kg    Examination:  General exam: Appears calm and comfortable  Respiratory system: decreased breath sounds b/l  Cardiovascular system: S1 & S2 +. No rubs, gallops or clicks. Gastrointestinal system: Abdomen is nondistended, soft and nontender. Normal bowel sounds heard. Central nervous system: Alert and awake Psychiatry: Judgement and insight appears at baseline. Flat mood and affect    Data Reviewed: I have personally reviewed following labs and imaging studies  CBC: Recent Labs  Lab 04/21/23 2352 04/22/23 0242  WBC 7.3 8.1  NEUTROABS 6.1  --   HGB 11.7* 9.8*  HCT 36.0* 30.8*  MCV 98.9 100.3*  PLT 158 137*   Basic Metabolic Panel: Recent Labs  Lab 04/21/23 2352 04/22/23 0242  NA 138 138  K 4.7 4.5  CL 99 102  CO2 26 22  GLUCOSE 132* 175*  BUN 76* 79*  CREATININE 3.49* 3.41*  CALCIUM 8.5* 7.8*   GFR: Estimated Creatinine Clearance: 17.5 mL/min (A) (by C-G formula based on SCr of 3.41 mg/dL (H)). Liver Function Tests: Recent Labs  Lab 04/21/23 2352  AST 19  ALT 15  ALKPHOS 66  BILITOT 0.7  PROT 6.9  ALBUMIN 3.3*   No results  for input(s): "LIPASE", "AMYLASE" in the last 168 hours. No results for input(s): "AMMONIA" in the last 168 hours. Coagulation Profile: Recent Labs  Lab 04/21/23 2352  INR 1.1   Cardiac Enzymes: No results for input(s): "CKTOTAL", "CKMB", "CKMBINDEX", "TROPONINI" in the last 168 hours. BNP (last 3 results) No results for input(s): "PROBNP" in the last 8760 hours. HbA1C: No results for input(s): "HGBA1C" in the last 72 hours. CBG: No results for input(s): "GLUCAP" in the last 168 hours. Lipid Profile: No results for input(s): "CHOL", "HDL", "LDLCALC", "TRIG",  "CHOLHDL", "LDLDIRECT" in the last 72 hours. Thyroid Function Tests: No results for input(s): "TSH", "T4TOTAL", "FREET4", "T3FREE", "THYROIDAB" in the last 72 hours. Anemia Panel: No results for input(s): "VITAMINB12", "FOLATE", "FERRITIN", "TIBC", "IRON", "RETICCTPCT" in the last 72 hours. Sepsis Labs: Recent Labs  Lab 04/21/23 2352 04/22/23 0242  PROCALCITON 0.22  --   LATICACIDVEN 1.0 2.3*    Recent Results (from the past 240 hours)  Blood Culture (routine x 2)     Status: None (Preliminary result)   Collection Time: 04/21/23 11:52 PM   Specimen: Left Antecubital; Blood  Result Value Ref Range Status   Specimen Description LEFT ANTECUBITAL  Final   Special Requests   Final    BOTTLES DRAWN AEROBIC AND ANAEROBIC Blood Culture results may not be optimal due to an inadequate volume of blood received in culture bottles   Culture   Final    NO GROWTH < 12 HOURS Performed at [facility], [address], [place], [place] [address]    Report Status PENDING  Incomplete  Resp panel by RT-PCR (RSV, Flu A&B, Covid) Anterior Nasal Swab     Status: None   Collection Time: 04/21/23 11:56 PM   Specimen: Anterior Nasal Swab  Result Value Ref Range Status   SARS Coronavirus 2 by RT PCR NEGATIVE NEGATIVE Final    Comment: (NOTE) SARS-CoV-2 target nucleic acids are NOT DETECTED.  The SARS-CoV-2 RNA is generally detectable in upper respiratory specimens during the acute phase of infection. The lowest concentration of SARS-CoV-2 viral copies this assay can detect is 138 copies/mL. A negative result does not preclude SARS-Cov-2 infection and should not be used as the sole basis for treatment or other patient management decisions. A negative result may occur with  improper specimen collection/handling, submission of specimen other than nasopharyngeal swab, presence of viral mutation(s) within the areas targeted by this assay, and inadequate number of viral copies(<138 copies/mL). A  negative result must be combined with clinical observations, patient history, and epidemiological information. The expected result is Negative.  Fact Sheet for Patients:  BloggerCourse.com  Fact Sheet for Healthcare Providers:  SeriousBroker.it  This test is no t yet approved or cleared by the [place] FDA and  has been authorized for detection and/or diagnosis of SARS-CoV-2 by FDA under an Emergency Use Authorization (EUA). This EUA will remain  in effect (meaning this test can be used) for the duration of the COVID-19 declaration under Section 564(b)(1) of the Act, 21 U.S.C.section 360bbb-3(b)(1), unless the authorization is terminated  or revoked sooner.       Influenza A by PCR NEGATIVE NEGATIVE Final   Influenza B by PCR NEGATIVE NEGATIVE Final    Comment: (NOTE) The Xpert Xpress SARS-CoV-2/FLU/RSV plus assay is intended as an aid in the diagnosis of influenza from Nasopharyngeal swab specimens and should not be used as a sole basis for treatment. Nasal washings and aspirates are unacceptable for Xpert Xpress SARS-CoV-2/FLU/RSV testing.  Fact Sheet for Patients: BloggerCourse.com  Fact Sheet for Healthcare Providers: SeriousBroker.it  This test is not yet approved or cleared by the [place] FDA and has been authorized for detection and/or diagnosis of SARS-CoV-2 by FDA under an Emergency Use Authorization (EUA). This EUA will remain in effect (meaning this test can be used) for the duration of the COVID-19 declaration under Section 564(b)(1) of the Act, 21 U.S.C. section 360bbb-3(b)(1), unless the authorization is terminated or revoked.     Resp Syncytial Virus by PCR NEGATIVE NEGATIVE Final    Comment: (NOTE) Fact Sheet for Patients: BloggerCourse.com  Fact Sheet for Healthcare  Providers: SeriousBroker.it  This test is not yet approved or cleared by the [place] FDA and has been authorized for detection and/or diagnosis of SARS-CoV-2 by FDA under an Emergency Use Authorization (EUA). This EUA will remain in effect (meaning this test can be used) for the duration of the COVID-19 declaration under Section 564(b)(1) of the Act, 21 U.S.C. section 360bbb-3(b)(1), unless the authorization is terminated or revoked.  Performed at [facility], [address], [place], [place] [address]          Radiology Studies: [facility] Chest Goff 1 View Result Date: 04/22/2023 CLINICAL DATA:  Possible sepsis EXAM: PORTABLE CHEST 1 VIEW COMPARISON:  09/06/2022 FINDINGS: Cardiac shadow is prominent. 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 Aortic calcifications are noted. Lungs are well aerated bilaterally. Mild central vascular prominence is again seen without edema. No focal infiltrate is noted. No bony abnormality is seen. IMPRESSION: No acute abnormality noted. Electronically Signed   By: Alcide Clever M.D.   On: 04/22/2023 00:21        Scheduled Meds:  vitamin C  1,000 mg Oral QHS   aspirin EC  81 mg Oral Daily   calcitRIOL  0.25 mcg Oral Daily   citalopram  40 mg Oral Daily   colchicine  0.6 mg Oral Daily   enoxaparin (LOVENOX) injection  30 mg Subcutaneous Q24H   ezetimibe  10 mg Oral QHS   febuxostat  40 mg Oral Daily   furosemide  40 mg Intravenous Q12H   gabapentin  200 mg Oral Daily   gabapentin  400 mg Oral QHS   guaiFENesin  600 mg Oral BID   insulin aspart  0-15 Units Subcutaneous TID WC   insulin aspart  0-5 Units Subcutaneous QHS   ipratropium-albuterol  3 mL Nebulization QID   methylPREDNISolone (SOLU-MEDROL) injection  40 mg Intravenous Q12H   Followed by   Melene Muller ON 04/23/2023] predniSONE  40 mg Oral Q breakfast   metoprolol succinate  50 mg Oral Daily   multivitamin  1 tablet Oral BID   pantoprazole  40 mg Oral Daily    tamsulosin  0.4 mg Oral Daily   Continuous Infusions:  cefTRIAXone (ROCEPHIN)  IV       LOS: 0 days      Charise Killian, MD [facility] Pager [phone]  If 7PM-7AM, please contact night-coverage [email or web address] 04/22/2023, 8:52 AM

## 2023-04-22 NOTE — ED Notes (Signed)
Bipap removed by RT. Pt on 5L satting 96%. Eating breakfast. Family at bedside.

## 2023-04-22 NOTE — Assessment & Plan Note (Signed)
-   His antihypertensive therapy will be resumed.

## 2023-04-22 NOTE — Assessment & Plan Note (Signed)
>>  ASSESSMENT AND PLAN FOR DYSLIPIDEMIA WRITTEN ON 04/28/2023  3:55 PM BY Verla Glaze, MD  Continue with Zetia and outpatient Repatha (hold while on antibiotics).

## 2023-04-22 NOTE — Assessment & Plan Note (Addendum)
-   This is likely secondary to COPD acute distress patient as well as acute on chronic systolic CHF.  This is acute on chronic respiratory failure with hypoxia. - The patient will be admitted to a progressive unit bed. - Will be continued on BiPAP which can be tapered as tolerated to nasal cannula. - Will continue diuresis with IV Lasix. - We will continue steroid therapy with IV Solu-Medrol as well as bronchodilator therapy with DuoNebs creatinine every 4 hours as needed and mucolytic therapy. - We will continue antibiotic therapy with IV Rocephin.  Will follow blood cultures. - Cardiology consult to be obtained. - I notified Dr. [name] about the patient.

## 2023-04-22 NOTE — Consult Note (Signed)
[name] [name] is a 77 y.o. male  [id number]  Primary Cardiologist: [name] [name] Reason for Consultation: Congestive heart failure status post aortic valve replacement  HPI: He is a 77 year old white male with a past medical history of aortic valve replacement for severe attic stenosis HFrEF presented to the hospital with shortness of breath orthopnea PND.  He denies any chest pain   Review of Systems: No chest pain but did have some fever   Past Medical History:  Diagnosis Date   Accidental cut, puncture, perforation, or hemorrhage during heart catheterization 12/27/2012   Acute postoperative pain 08/02/2016   Acute renal failure (ARF) (HCC)    AKI (acute kidney injury) (HCC) 07/27/2020   ANA positive 07/12/2015   Aortic stenosis, 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 post. helix. 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   CAD (coronary artery disease)    CAP (community acquired pneumonia) 07/26/2020   CKD (chronic kidney disease), stage IV (HCC)    Complete tear of right rotator cuff 11/15/2015   Complex tear of lateral meniscus of left knee as current injury 12/06/2020   Complex tear of medial meniscus of left knee as current injury 10/18/2020   COPD (chronic obstructive pulmonary disease) (HCC)    DM (diabetes mellitus), type 2 (HCC) 07/30/2020   no meds   DOE (dyspnea on exertion)    GERD (gastroesophageal reflux disease)    Gout    Grade I diastolic dysfunction    Heart murmur    HFrEF (heart failure with reduced ejection fraction) (HCC)    History of hiatal hernia    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  Performed by Dr. Zebedee [name] on 08/01/16:  29 mm commercially available Medtronic Core Valve transcatheter aortic valve procedure placed via transfemoral approach by Dr. Zebedee [name] and co-surgeon Dr. Winona [name].   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 MOHS.   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/07/2021   Right zygoma, [facility] 05/02/21   Supplemental oxygen dependent    3-4 L/[place]   Supplemental oxygen dependent    T2DM (type 2 diabetes mellitus) (HCC)    no meds    (Not in a hospital admission)     vitamin C  1,000 mg Oral QHS   aspirin EC  81 mg Oral Daily   calcitRIOL  0.25 mcg Oral Daily   citalopram  40 mg Oral Daily   colchicine  0.6 mg Oral Daily   enoxaparin (LOVENOX) injection  30 mg Subcutaneous  Q24H   ezetimibe  10 mg Oral QHS   febuxostat  40 mg Oral Daily   [START ON 04/23/2023] furosemide  40 mg Intravenous Daily   gabapentin  200 mg Oral Daily   gabapentin  400 mg Oral QHS   guaiFENesin  600 mg Oral BID   insulin aspart  0-15 Units Subcutaneous TID WC   insulin aspart  0-5 Units Subcutaneous QHS   ipratropium-albuterol  3 mL Nebulization QID   methylPREDNISolone (SOLU-MEDROL) injection  40 mg Intravenous Q12H   Followed by   Melene Muller ON 04/23/2023] predniSONE  40 mg Oral Q breakfast   metoprolol succinate  50 mg Oral Daily   multivitamin  1 tablet Oral BID   pantoprazole  40 mg Oral Daily   tamsulosin  0.4 mg Oral Daily     Infusions:  [START ON 04/23/2023] azithromycin     [START ON 04/23/2023] cefTRIAXone (ROCEPHIN)  IV      Allergies  Allergen Reactions   Nsaids Other (See Comments)    Avoid due to kidney disease    Statins Other (See Comments)    Myalgias   Nexletol [Bempedoic Acid] Diarrhea    fatigue   Silver Other (See Comments)   Tegaderm High Gelling Alginate [Wound Dressings] Rash    Social History   Socioeconomic History   Marital status: Married    Spouse name: [name] [name]   Number of children: 2   Years of education: Not on file   Highest education level: Not on file  Occupational History   Occupation: retired  Tobacco Use   Smoking status: Former    Current packs/day: 0.00    Average packs/day: 2.0 packs/day for 54.0 years (108.0 ttl pk-yrs)    Types: Cigarettes    Start date: 27    Quit date: 2010    Years since quitting: 15.0   Smokeless tobacco: Former    Types: Chew    Quit date: 05/16/2004  Vaping Use   Vaping status: Never Used  Substance and Sexual Activity   Alcohol use: No   Drug use: Never   Sexual activity: Not on file  Other Topics Concern   Not on file  Social History Narrative   Not on file   Social Drivers of Health   Financial Resource Strain: Low Risk  (03/09/2023)   Overall Financial Resource Strain (CARDIA)    Difficulty of Paying Living Expenses: Not hard at all  Food Insecurity: No Food Insecurity (03/09/2023)   Hunger Vital Sign    Worried About Running Out of Food in the Last Year: Never true    Ran Out of Food in the Last Year: Never true  Transportation Needs: No Transportation Needs (03/09/2023)   PRAPARE - Administrator, Civil Service (Medical): No    Lack of Transportation (Non-Medical): No  Physical Activity: Inactive (03/09/2023)   Exercise Vital Sign    Days of Exercise per Week: 0 days    Minutes of Exercise per Session: 0 min  Stress: No Stress Concern Present (03/09/2023)   [employer] of  Occupational Health - Occupational Stress Questionnaire    Feeling of Stress : Not at all  Social Connections: Unknown (03/09/2023)   Social Connection and Isolation Panel [NHANES]    Frequency of Communication with Friends and Family: More than three times a week    Frequency of Social Gatherings with Friends and Family: More than three times a week    Attends Religious Services: More than 4  times per year    Active Member of Clubs or Organizations: Not on file    Attends Club or Organization Meetings: Not on file    Marital Status: Married  Intimate Partner Violence: Not At Risk (03/09/2023)   Humiliation, Afraid, Rape, and Kick questionnaire    Fear of Current or Ex-Partner: No    Emotionally Abused: No    Physically Abused: No    Sexually Abused: No    Family History  Adopted: Yes  Problem Relation Age of Onset   Varicose Veins Daughter    Obesity Daughter    Hyperlipidemia Daughter    Diabetes Daughter    COPD Daughter    Depression Daughter    Asthma Daughter    Arthritis Son    Diabetes Son    Hypertension Son     PHYSICAL EXAM: Vitals:   04/22/23 1212 04/22/23 1222  BP: (!) 165/62   Pulse: 90   Resp: (!) 26   Temp:    SpO2: (S) (!) 86% 95%     Intake/Output Summary (Last 24 hours) at 04/22/2023 1342 Last data filed at 04/22/2023 0921 Gross per 24 hour  Intake 350 ml  Output 150 ml  Net 200 ml    General:  Well appearing. No respiratory difficulty HEENT: normal Neck: supple. no JVD. Carotids 2+ bilat; no bruits. No lymphadenopathy or thryomegaly appreciated. Cor: PMI nondisplaced. Regular rate & rhythm. No rubs, gallops or murmurs. Lungs: clear Abdomen: soft, nontender, nondistended. No hepatosplenomegaly. No bruits or masses. Good bowel sounds. Extremities: no cyanosis, clubbing, rash, edema Neuro: alert & oriented x 3, cranial nerves grossly intact. moves all 4 extremities w/o difficulty. Affect pleasant.  ECG: Normal sinus rhythm LVH with  nonspecific ST-T changes  Results for orders placed or performed during the hospital encounter of 04/21/23 (from the past 24 hours)  Procalcitonin     Status: None   Collection Time: 04/21/23 11:52 PM  Result Value Ref Range   Procalcitonin 0.22 ng/mL  Lactic acid, plasma     Status: None   Collection Time: 04/21/23 11:52 PM  Result Value Ref Range   Lactic Acid, Venous 1.0 0.5 - 1.9 mmol/L  Comprehensive metabolic panel     Status: Abnormal   Collection Time: 04/21/23 11:52 PM  Result Value Ref Range   Sodium 138 135 - 145 mmol/L   Potassium 4.7 3.5 - 5.1 mmol/L   Chloride 99 98 - 111 mmol/L   CO2 26 22 - 32 mmol/L   Glucose, Bld 132 (H) 70 - 99 mg/dL   BUN 76 (H) 8 - 23 mg/dL   Creatinine, Ser 1.61 (H) 0.61 - 1.24 mg/dL   Calcium 8.5 (L) 8.9 - 10.3 mg/dL   Total Protein 6.9 6.5 - 8.1 g/dL   Albumin 3.3 (L) 3.5 - 5.0 g/dL   AST 19 15 - 41 U/L   ALT 15 0 - 44 U/L   Alkaline Phosphatase 66 38 - 126 U/L   Total Bilirubin 0.7 <1.2 mg/dL   GFR, Estimated 17 (L) >60 mL/min   Anion gap 13 5 - 15  CBC with Differential     Status: Abnormal   Collection Time: 04/21/23 11:52 PM  Result Value Ref Range   WBC 7.3 4.0 - 10.5 K/uL   RBC 3.64 (L) 4.22 - 5.81 MIL/uL   Hemoglobin 11.7 (L) 13.0 - 17.0 g/dL   HCT 09.6 (L) 04.5 - 40.9 %   MCV 98.9 80.0 - 100.0 fL   MCH 32.1  26.0 - 34.0 pg   MCHC 32.5 30.0 - 36.0 g/dL   RDW 54.0 98.1 - 19.1 %   Platelets 158 150 - 400 K/uL   nRBC 0.0 0.0 - 0.2 %   Neutrophils Relative % 85 %   Neutro Abs 6.1 1.7 - 7.7 K/uL   Lymphocytes Relative 7 %   Lymphs Abs 0.5 (L) 0.7 - 4.0 K/uL   Monocytes Relative 6 %   Monocytes Absolute 0.5 0.1 - 1.0 K/uL   Eosinophils Relative 0 %   Eosinophils Absolute 0.0 0.0 - 0.5 K/uL   Basophils Relative 0 %   Basophils Absolute 0.0 0.0 - 0.1 K/uL   Immature Granulocytes 2 %   Abs Immature Granulocytes 0.15 (H) 0.00 - 0.07 K/uL  Protime-INR     Status: None   Collection Time: 04/21/23 11:52 PM  Result Value Ref  Range   Prothrombin Time 14.0 11.4 - 15.2 seconds   INR 1.1 0.8 - 1.2  APTT     Status: None   Collection Time: 04/21/23 11:52 PM  Result Value Ref Range   aPTT 29 24 - 36 seconds  Blood Culture (routine x 2)     Status: None (Preliminary result)   Collection Time: 04/21/23 11:52 PM   Specimen: Left Antecubital; Blood  Result Value Ref Range   Specimen Description LEFT ANTECUBITAL    Special Requests      BOTTLES DRAWN AEROBIC AND ANAEROBIC Blood Culture results may not be optimal due to an inadequate volume of blood received in culture bottles   Culture  Setup Time      Organism ID to follow GRAM POSITIVE COCCI IN BOTH AEROBIC AND ANAEROBIC BOTTLES CRITICAL RESULT CALLED TO, READ BACK BY AND VERIFIED WITH: MADISON HUNT @1053  04/22/23 MJU Performed at [facility], [address] [place], [place] [address]    Culture GRAM POSITIVE COCCI    Report Status PENDING   Troponin I (High Sensitivity)     Status: None   Collection Time: 04/21/23 11:52 PM  Result Value Ref Range   Troponin I (High Sensitivity) 17 <18 ng/L  Brain natriuretic peptide     Status: Abnormal   Collection Time: 04/21/23 11:52 PM  Result Value Ref Range   B Natriuretic Peptide 820.9 (H) 0.0 - 100.0 pg/mL  Blood Culture ID Panel (Reflexed)     Status: Abnormal   Collection Time: 04/21/23 11:52 PM  Result Value Ref Range   Enterococcus faecalis NOT DETECTED NOT DETECTED   Enterococcus Faecium NOT DETECTED NOT DETECTED   Listeria monocytogenes NOT DETECTED NOT DETECTED   Staphylococcus species NOT DETECTED NOT DETECTED   Staphylococcus aureus (BCID) NOT DETECTED NOT DETECTED   Staphylococcus epidermidis NOT DETECTED NOT DETECTED   Staphylococcus lugdunensis NOT DETECTED NOT DETECTED   Streptococcus species DETECTED (A) NOT DETECTED   Streptococcus agalactiae NOT DETECTED NOT DETECTED   Streptococcus pneumoniae NOT DETECTED NOT DETECTED   Streptococcus pyogenes NOT DETECTED NOT DETECTED    A.calcoaceticus-baumannii NOT DETECTED NOT DETECTED   Bacteroides fragilis NOT DETECTED NOT DETECTED   Enterobacterales NOT DETECTED NOT DETECTED   Enterobacter cloacae complex NOT DETECTED NOT DETECTED   Escherichia coli NOT DETECTED NOT DETECTED   Klebsiella aerogenes NOT DETECTED NOT DETECTED   Klebsiella oxytoca NOT DETECTED NOT DETECTED   Klebsiella pneumoniae NOT DETECTED NOT DETECTED   Proteus species NOT DETECTED NOT DETECTED   Salmonella species NOT DETECTED NOT DETECTED   Serratia marcescens NOT DETECTED NOT DETECTED   Haemophilus  influenzae NOT DETECTED NOT DETECTED   Neisseria meningitidis NOT DETECTED NOT DETECTED   Pseudomonas aeruginosa NOT DETECTED NOT DETECTED   Stenotrophomonas maltophilia NOT DETECTED NOT DETECTED   Candida albicans NOT DETECTED NOT DETECTED   Candida auris NOT DETECTED NOT DETECTED   Candida glabrata NOT DETECTED NOT DETECTED   Candida krusei NOT DETECTED NOT DETECTED   Candida parapsilosis NOT DETECTED NOT DETECTED   Candida tropicalis NOT DETECTED NOT DETECTED   Cryptococcus neoformans/gattii NOT DETECTED NOT DETECTED  Resp panel by RT-PCR (RSV, Flu A&B, Covid) Anterior Nasal Swab     Status: None   Collection Time: 04/21/23 11:56 PM   Specimen: Anterior Nasal Swab  Result Value Ref Range   SARS Coronavirus 2 by RT PCR NEGATIVE NEGATIVE   Influenza A by PCR NEGATIVE NEGATIVE   Influenza B by PCR NEGATIVE NEGATIVE   Resp Syncytial Virus by PCR NEGATIVE NEGATIVE  Urinalysis, Routine w reflex microscopic -Urine, Clean Catch     Status: Abnormal   Collection Time: 04/21/23 11:56 PM  Result Value Ref Range   Color, Urine YELLOW (A) YELLOW   APPearance HAZY (A) CLEAR   Specific Gravity, Urine 1.013 1.005 - 1.030   pH 5.0 5.0 - 8.0   Glucose, UA NEGATIVE NEGATIVE mg/dL   Hgb urine dipstick NEGATIVE NEGATIVE   Bilirubin Urine NEGATIVE NEGATIVE   Ketones, ur NEGATIVE NEGATIVE mg/dL   Protein, ur >=161 (A) NEGATIVE mg/dL   Nitrite NEGATIVE  NEGATIVE   Leukocytes,Ua NEGATIVE NEGATIVE   RBC / HPF 0-5 0 - 5 RBC/hpf   WBC, UA 0-5 0 - 5 WBC/hpf   Bacteria, UA RARE (A) NONE SEEN   Squamous Epithelial / HPF 0-5 0 - 5 /HPF   Mucus PRESENT    Hyaline Casts, UA PRESENT   Lactic acid, plasma     Status: Abnormal   Collection Time: 04/22/23  2:42 AM  Result Value Ref Range   Lactic Acid, Venous 2.3 (HH) 0.5 - 1.9 mmol/L  Troponin I (High Sensitivity)     Status: Abnormal   Collection Time: 04/22/23  2:42 AM  Result Value Ref Range   Troponin I (High Sensitivity) 29 (H) <18 ng/L  Basic metabolic panel     Status: Abnormal   Collection Time: 04/22/23  2:42 AM  Result Value Ref Range   Sodium 138 135 - 145 mmol/L   Potassium 4.5 3.5 - 5.1 mmol/L   Chloride 102 98 - 111 mmol/L   CO2 22 22 - 32 mmol/L   Glucose, Bld 175 (H) 70 - 99 mg/dL   BUN 79 (H) 8 - 23 mg/dL   Creatinine, Ser 0.96 (H) 0.61 - 1.24 mg/dL   Calcium 7.8 (L) 8.9 - 10.3 mg/dL   GFR, Estimated 18 (L) >60 mL/min   Anion gap 14 5 - 15  CBC     Status: Abnormal   Collection Time: 04/22/23  2:42 AM  Result Value Ref Range   WBC 8.1 4.0 - 10.5 K/uL   RBC 3.07 (L) 4.22 - 5.81 MIL/uL   Hemoglobin 9.8 (L) 13.0 - 17.0 g/dL   HCT 04.5 (L) 40.9 - 81.1 %   MCV 100.3 (H) 80.0 - 100.0 fL   MCH 31.9 26.0 - 34.0 pg   MCHC 31.8 30.0 - 36.0 g/dL   RDW 91.4 78.2 - 95.6 %   Platelets 137 (L) 150 - 400 K/uL   nRBC 0.0 0.0 - 0.2 %  CBG monitoring, ED     Status:  Abnormal   Collection Time: 04/22/23  9:12 AM  Result Value Ref Range   Glucose-Capillary 167 (H) 70 - 99 mg/dL  CBG monitoring, ED     Status: Abnormal   Collection Time: 04/22/23 11:50 AM  Result Value Ref Range   Glucose-Capillary 257 (H) 70 - 99 mg/dL   DG Chest Port 1 View Result Date: 04/22/2023 CLINICAL DATA:  Possible sepsis EXAM: PORTABLE CHEST 1 VIEW COMPARISON:  09/06/2022 FINDINGS: Cardiac shadow is prominent. 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 Aortic calcifications are noted. Lungs are well aerated bilaterally.  Mild central vascular prominence is again seen without edema. No focal infiltrate is noted. No bony abnormality is seen. IMPRESSION: No acute abnormality noted. Electronically Signed   By: Alcide Clever M.D.   On: 04/22/2023 00:21     ASSESSMENT AND PLAN: Acute on chronic HFrEF with respiratory failure and hypoxia with also COPD exacerbation.  Patient was started on IV Lasix and is feeling much better.  He was also given steroids and Solu-Medrol and DuoNeb treatment which has helped a lot.  It appears that most of patient's symptoms are related to COPD exacerbation.  Patient has renal insufficiency with creatinine over 3.  Agree with changing Lasix to 40 mg once a day IV.  Cardiac point of view no further workup is necessary.  Patient can be followed as an outpatient once COPD exacerbation resolves.  Patient can be discharged on p.o. Lasix 40 once a day.  Malic Rosten [name]

## 2023-04-22 NOTE — Progress Notes (Signed)
PHARMACY - PHYSICIAN COMMUNICATION CRITICAL VALUE ALERT - BLOOD CULTURE IDENTIFICATION (BCID)  [name] [name] is an 76 y.o. male who presented to [facility] on 04/21/2023 with a chief complaint of acute onset of worsening dyspnea with associated orthopnea.   Assessment:  2/4 bottles (same set, both aerobic and anaerobic bottle) GPC, BCID Strep species, no resistance detected   Name of physician (or Provider) Contacted: Fabienne Bruns, MD  Current antibiotics: Azithromycin, ceftriaxone (x 1 dose)  Changes to prescribed antibiotics recommended:  Restart ceftriaxone  Results for orders placed or performed during the hospital encounter of 04/21/23  Blood Culture ID Panel (Reflexed) (Collected: 04/21/2023 11:52 PM)  Result Value Ref Range   Enterococcus faecalis NOT DETECTED NOT DETECTED   Enterococcus Faecium NOT DETECTED NOT DETECTED   Listeria monocytogenes NOT DETECTED NOT DETECTED   Staphylococcus species NOT DETECTED NOT DETECTED   Staphylococcus aureus (BCID) NOT DETECTED NOT DETECTED   Staphylococcus epidermidis NOT DETECTED NOT DETECTED   Staphylococcus lugdunensis NOT DETECTED NOT DETECTED   Streptococcus species DETECTED (A) NOT DETECTED   Streptococcus agalactiae NOT DETECTED NOT DETECTED   Streptococcus pneumoniae NOT DETECTED NOT DETECTED   Streptococcus pyogenes NOT DETECTED NOT DETECTED   A.calcoaceticus-baumannii NOT DETECTED NOT DETECTED   Bacteroides fragilis NOT DETECTED NOT DETECTED   Enterobacterales NOT DETECTED NOT DETECTED   Enterobacter cloacae complex NOT DETECTED NOT DETECTED   Escherichia coli NOT DETECTED NOT DETECTED   Klebsiella aerogenes NOT DETECTED NOT DETECTED   Klebsiella oxytoca NOT DETECTED NOT DETECTED   Klebsiella pneumoniae NOT DETECTED NOT DETECTED   Proteus species NOT DETECTED NOT DETECTED   Salmonella species NOT DETECTED NOT DETECTED   Serratia marcescens NOT DETECTED NOT DETECTED   Haemophilus influenzae NOT DETECTED NOT  DETECTED   Neisseria meningitidis NOT DETECTED NOT DETECTED   Pseudomonas aeruginosa NOT DETECTED NOT DETECTED   Stenotrophomonas maltophilia NOT DETECTED NOT DETECTED   Candida albicans NOT DETECTED NOT DETECTED   Candida auris NOT DETECTED NOT DETECTED   Candida glabrata NOT DETECTED NOT DETECTED   Candida krusei NOT DETECTED NOT DETECTED   Candida parapsilosis NOT DETECTED NOT DETECTED   Candida tropicalis NOT DETECTED NOT DETECTED   Cryptococcus neoformans/gattii NOT DETECTED NOT DETECTED    Merryl Hacker, PharmD  Clinical Pharmacist  04/22/2023  11:21 AM

## 2023-04-22 NOTE — Assessment & Plan Note (Signed)
-  We will continue Carafate and PPI therapy. ?

## 2023-04-22 NOTE — ED Notes (Signed)
Patient provided with sterile water for his home CPAP machine.

## 2023-04-22 NOTE — Assessment & Plan Note (Signed)
-   Will resume BuSpar.

## 2023-04-22 NOTE — Assessment & Plan Note (Signed)
-   We will continue his colchicine and Uloric.

## 2023-04-22 NOTE — H&P (Addendum)
[place]   PATIENT NAME: [name] [name]    MR#:  [id number]  DATE OF BIRTH:  [birth year]  DATE OF ADMISSION:  04/21/2023  PRIMARY CARE PHYSICIAN: Miki Kins, FNP   Patient is coming from: Home  REQUESTING/REFERRING PHYSICIAN: Delton Prairie, MD  CHIEF COMPLAINT:   Chief Complaint  Patient presents with   Shortness of Breath    HISTORY OF PRESENT ILLNESS:  [name] [name] is a 77 y.o. male with medical history significant for systolic CHF, coronary artery disease, GERD, gout, dyslipidemia, OSA on CPAP, and stage IV chronic kidney disease, who presented to the emergency room with acute onset of worsening dyspnea with associated orthopnea.  The patient's wife noted that his blood pressure was elevated and he was having chills so she covered him with 2 blankets and called EMS.  Patient reportedly had a fever of 100.6 axillary.  Is on oxygen at home at 3 L/min and apparently his oxygen tank ran out of oxygen while he was in triage with worsening dyspnea.  No chest pain or palpitations.  No dysuria, oliguria or hematuria or flank pain he denies any significantly worsening cough or wheezing.  ED Course: When he came to the ER, temperature was 100.6/38.1 axillary, respiratory 24 and BP 164/121.  He was in significant respiratory distress he was placed on BiPAP and on 40% FiO2 satting 98 to 100%.  Labs revealed a BUN of 76 and creatinine 3.49, the previous levels and albumin 3.3.  BNP was 820.9 and high sensitive troponin I 17.  Procalcitonin was 0.22 and lactic acid 1.  CBC showed hemoglobin of 11.7 hematocrit 36 close to previous levels.  Coag profile was within normal and respiratory panel was negative.  UA showed 0-5 WBCs and more than 300 protein. EKG as reviewed by me :EKG showed sinus rhythm with rate of 94 with Q waves inferiorly. Imaging: Portable chest x-ray showed no acute cardiopulmonary disease.  Patient was given IV Rocephin and Zithromax, 4 mg of IV  Zofran and 1 p.o. Tylenol 1 g.  He will be admitted to a progressive unit bed for further evaluation and management.  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 post. helix. 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   CAD (coronary artery disease)    CAP (community acquired pneumonia) 07/26/2020   CKD (chronic kidney disease), stage IV (HCC)    Complete tear of right rotator cuff 11/15/2015   Complex tear of lateral meniscus of left knee as current injury 12/06/2020   Complex tear of medial meniscus of left knee as current injury 10/18/2020   COPD (chronic obstructive pulmonary disease) (HCC)    DM (diabetes mellitus), type 2 (HCC) 07/30/2020   no meds   DOE (dyspnea on exertion)    GERD (gastroesophageal reflux disease)    Gout    Grade I diastolic dysfunction    Heart murmur    HFrEF (heart failure with reduced ejection fraction) (  HCC)    History of hiatal hernia    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  Performed by Dr. Zebedee Iba on 08/01/16:  29 mm commercially available Medtronic Core Valve transcatheter aortic valve procedure placed via transfemoral approach by Dr. Zebedee Iba and co-surgeon Dr. Winona Legato.   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 MOHS.   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/07/2021   Right zygoma, EDC 05/02/21   Supplemental oxygen dependent    3-4 L/[place]   Supplemental oxygen dependent    T2DM (type 2 diabetes mellitus) (HCC)    no meds    PAST SURGICAL HISTORY:   Past Surgical History:  Procedure Laterality Date   AORTIC VALVE REPLACEMENT N/A 08/01/2016   29 mm CoreValve Evolut; Location: [facility]; Surgeon: Clent Jacks, MD   CARDIAC CATHETERIZATION N/A 12/26/2012   2v CAD; retained pigtail in myocardium --> transferred to [facility]; Location: [facility]; Surgeon: Despina Hick, MD   CATARACT EXTRACTION W/PHACO Left 10/03/2022   Procedure: CATARACT EXTRACTION PHACO AND INTRAOCULAR LENS PLACEMENT (IOC) LEFT DIABETIC 8.22 00:55.5;  Surgeon: Galen Manila, MD;  Location: [facility];  Service: Ophthalmology;  Laterality: Left;  sleep apnea   CATARACT EXTRACTION W/PHACO Right 10/17/2022   Procedure: CATARACT EXTRACTION PHACO AND INTRAOCULAR LENS PLACEMENT (IOC) RIGHT DIABETIC;  Surgeon: Galen Manila, MD;  Location: [facility];  Service: Ophthalmology;  Laterality: Right;  4.79 0:37.4   COLONOSCOPY     KNEE ARTHROSCOPY WITH MEDIAL MENISECTOMY Left 12/02/2020   Procedure: KNEE ARTHROSCOPY WITH DEBRIDEMENT AND PARTIAL MEDIAL AND LATERAL MENISECTOMY;  Surgeon: Christena Flake, MD;  Location: [facility];  Service: Orthopedics;  Laterality: Left;   LITHOTRIPSY     PERCUTANEOUS REMOVAL INTRA-AORTIC BALLOON CATH N/A 12/26/2012   Procedure: PERCUTANEOUS  REMOVAL INTRA-AORTIC BALLOON CATH; Surgeon: Heloise Ochoa, MD; Location: [facility]; Service: Cardiothoracic   RIGHT HEART CATH Right 07/13/2016   Location: [facility]; Surgeon: Emilio Math, MD   TRANSESOPHAGEAL ECHOCARDIOGRAM N/A 12/26/2012   Procedure: TRANSESOPHAGEAL ECHOCARDIOGRAPHY; Surgeon: Heloise Ochoa, MD; Location: [facility]; Service: Cardiothoracic   UMBILICAL HERNIA REPAIR N/A 02/13/2014   Procedure: LAPAROSCOPIC UMBILICAL HERNIA REPAIR; Surgeon: Merlinda Frederick, MD; Location: [facility]; Service: General Surgery    SOCIAL HISTORY:   Social History   Tobacco Use   Smoking status: Former    Current packs/day: 0.00    Average packs/day: 2.0 packs/day for 54.0 years (108.0 ttl pk-yrs)    Types: Cigarettes    Start date: 2    Quit date: 2010    Years since quitting: 15.0   Smokeless tobacco: Former    Types: Chew    Quit date: 05/16/2004  Substance Use Topics   Alcohol use: No  FAMILY HISTORY:   Family History  Adopted: Yes  Problem Relation Age of Onset   Varicose Veins Daughter    Obesity Daughter    Hyperlipidemia Daughter    Diabetes Daughter    COPD Daughter    Depression Daughter    Asthma Daughter    Arthritis Son    Diabetes Son    Hypertension Son     DRUG ALLERGIES:   Allergies  Allergen Reactions   Nsaids Other (See Comments)    Avoid due to kidney disease    Statins Other (See Comments)    Myalgias   Nexletol [Bempedoic Acid] Diarrhea    fatigue   Silver Other (See Comments)   Tegaderm High Gelling Alginate [Wound Dressings] Rash    REVIEW OF SYSTEMS:   ROS As per history of present illness. All pertinent systems were reviewed above. Constitutional, HEENT, cardiovascular, respiratory, GI, GU, musculoskeletal, neuro, psychiatric, endocrine, integumentary and hematologic systems were reviewed and are otherwise negative/unremarkable except for positive findings mentioned above in the  HPI.   MEDICATIONS AT HOME:   Prior to Admission medications   Medication Sig Start Date End Date Taking? Authorizing Provider  acetaminophen (TYLENOL) 650 MG CR tablet Take 1,300 mg by mouth every 8 (eight) hours as needed for pain.   Yes [provider]  albuterol (VENTOLIN HFA) 108 (90 Base) MCG/ACT inhaler Inhale 2 puffs into the lungs every 6 (six) hours as needed for wheezing or shortness of breath. 07/08/14  Yes [provider]  Ascorbic Acid (VITAMIN C) 1000 MG tablet Take 1,000 mg by mouth at bedtime.   Yes [provider]  aspirin EC 81 MG EC tablet Take 1 tablet (81 mg total) by mouth daily. 07/11/16  Yes Ezequiel Essex, NP  busPIRone (BUSPAR) 5 MG tablet TAKE 1 TABLET BY MOUTH TWICE DAILY AS NEEDED 03/27/23  Yes Miki Kins, FNP  calcitRIOL (ROCALTROL) 0.25 MCG capsule Take 0.25 mcg by mouth daily.   Yes [provider]  citalopram (CELEXA) 40 MG tablet TAKE 1 TABLET EVERY DAY 08/31/22  Yes Miki Kins, FNP  colchicine 0.6 MG tablet Take 0.3 mg by mouth daily. 04/12/23 07/11/23 Yes [provider]  Evolocumab (REPATHA SURECLICK) 140 MG/ML SOAJ INJECT CONTENT OF 1 CARTRIDGE UNDER THE SKIN EVERY OTHER WEEK 02/05/23  Yes Adrian Blackwater A, MD  ezetimibe (ZETIA) 10 MG tablet TAKE 1 TABLET(10 MG) BY MOUTH AT BEDTIME 07/07/22  Yes Miki Kins, FNP  febuxostat (ULORIC) 40 MG tablet Take 40 mg by mouth daily. 10/09/22  Yes [provider]  fexofenadine (ALLEGRA) 180 MG tablet Take 180 mg by mouth daily as needed for allergies or rhinitis.   Yes [provider]  Fluticasone-Umeclidin-Vilant (TRELEGY ELLIPTA) 100-62.5-25 MCG/ACT AEPB Inhale 1 Inhalation into the lungs daily at 6 (six) AM. Patient taking differently: Inhale 1 Inhalation into the lungs daily as needed. 10/12/22  Yes Miki Kins, FNP  gabapentin (NEURONTIN) 100 MG capsule TAKE 2 CAPSULES TWICE DAILY 04/10/23  Yes Miki Kins, FNP  gabapentin  (NEURONTIN) 400 MG capsule TAKE 1 CAPSULE AT BEDTIME 08/22/22  Yes Miki Kins, FNP  metoprolol succinate (TOPROL-XL) 50 MG 24 hr tablet TAKE 1 TABLET EVERY DAY 03/24/23  Yes Miki Kins, FNP  Multiple Vitamins-Minerals (PRESERVISION AREDS) CAPS Take 1 capsule by mouth 2 (two) times daily.   Yes [provider]  ondansetron (ZOFRAN-ODT) 4 MG disintegrating tablet Take 1 tablet (4 mg total) by mouth every 8 (eight)  hours as needed for nausea or vomiting. 03/23/23  Yes Sharman Cheek, MD  pantoprazole (PROTONIX) 40 MG tablet Take 40 mg by mouth every morning.   Yes [provider]  Simethicone (GAS-X PO) Take 1 tablet by mouth 2 (two) times daily as needed (flatulence).   Yes [provider]  sodium chloride (OCEAN) 0.65 % SOLN nasal spray Place 1 spray into both nostrils as needed for congestion. 07/11/16  Yes Ezequiel Essex, NP  SUPER B COMPLEX/C PO Take 1 tablet by mouth daily.   Yes [provider]  tamsulosin (FLOMAX) 0.4 MG CAPS capsule TAKE 1 CAPSULE EVERY DAY 07/13/22  Yes Miki Kins, FNP  torsemide (DEMADEX) 20 MG tablet Take 20 mg by mouth daily. 02/26/23  Yes [provider]  Alcohol Swabs (DROPSAFE ALCOHOL PREP) 70 % PADS Apply 1 each topically as needed (With repatha and blood sugar checks.). 01/19/22   [provider]  ipratropium-albuterol (DUONEB) 0.5-2.5 (3) MG/3ML SOLN Take 3 mLs by nebulization every 4 (four) hours as needed. Patient not taking: Reported on 04/22/2023 06/22/21   Yevonne Pax, MD  OXYGEN Inhale 3 L into the lungs See admin instructions. Used as needed throughout the day and continuous at bedtime    [provider]  sucralfate (CARAFATE) 1 g tablet Take 1 tablet (1 g total) by mouth 4 (four) times daily. Patient not taking: Reported on 04/22/2023 03/23/23   Sharman Cheek, MD      VITAL SIGNS:  Blood pressure (!) 140/54, pulse 94, temperature 99 F (37.2 C), temperature source Oral,  resp. rate (!) 29, weight 90.3 kg, SpO2 96%.  PHYSICAL EXAMINATION:  Physical Exam  GENERAL:  77 y.o.-year-old obese Caucasian male patient semi-lying in the bed with moderate respiratory distress with conversational dyspnea on BiPAP EYES: Pupils equal, round, reactive to light and accommodation. No scleral icterus. Extraocular muscles intact.  HEENT: Head atraumatic, normocephalic. Oropharynx and nasopharynx clear.  NECK:  Supple, no jugular venous distention. No thyroid enlargement, no tenderness.  LUNGS: Diminished bibasal breath sounds with bibasal crackles and residual extremity wheezes and diminished expiratory airflow.  No use of accessory muscles of respiration.  CARDIOVASCULAR: Regular rate and rhythm, S1, S2 normal. No murmurs, rubs, or gallops.  ABDOMEN: Soft, nondistended, nontender. Bowel sounds present. No organomegaly or mass.  EXTREMITIES: Trace bilateral lower extremity pitting edema with no cyanosis, or clubbing.  NEUROLOGIC: Cranial nerves II through XII are intact. Muscle strength 5/5 in all extremities. Sensation intact. Gait not checked.  PSYCHIATRIC: The patient is alert and oriented x 3.  Normal affect and good eye contact. SKIN: No obvious rash, lesion, or ulcer.   LABORATORY PANEL:   CBC Recent Labs  Lab 04/22/23 0242  WBC 8.1  HGB 9.8*  HCT 30.8*  PLT 137*   ------------------------------------------------------------------------------------------------------------------  Chemistries  Recent Labs  Lab 04/21/23 2352  NA 138  K 4.7  CL 99  CO2 26  GLUCOSE 132*  BUN 76*  CREATININE 3.49*  CALCIUM 8.5*  AST 19  ALT 15  ALKPHOS 66  BILITOT 0.7   ------------------------------------------------------------------------------------------------------------------  Cardiac Enzymes No results for input(s): "TROPONINI" in the last 168  hours. ------------------------------------------------------------------------------------------------------------------  RADIOLOGY:  DG Chest Port 1 View Result Date: 04/22/2023 CLINICAL DATA:  Possible sepsis EXAM: PORTABLE CHEST 1 VIEW COMPARISON:  09/06/2022 FINDINGS: Cardiac shadow is prominent. 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 Aortic calcifications are noted. Lungs are well aerated bilaterally. Mild central vascular prominence is again seen without edema. No focal infiltrate  is noted. No bony abnormality is seen. IMPRESSION: No acute abnormality noted. Electronically Signed   By: Alcide Clever M.D.   On: 04/22/2023 00:21      IMPRESSION AND PLAN:  Assessment and Plan: * Acute respiratory failure (HCC) - This is likely secondary to COPD acute distress patient as well as acute on chronic systolic CHF.  This is acute on chronic respiratory failure with hypoxia. - The patient will be admitted to a progressive unit bed. - Will be continued on BiPAP which can be tapered as tolerated to nasal cannula. - Will continue diuresis with IV Lasix. - We will continue steroid therapy with IV Solu-Medrol as well as bronchodilator therapy with DuoNebs creatinine every 4 hours as needed and mucolytic therapy. - We will continue antibiotic therapy with IV Rocephin.  Will follow blood cultures. - Cardiology consult to be obtained. - I notified Dr. [name] about the patient.  Type 2 diabetes mellitus with chronic kidney disease, without long-term current use of insulin (HCC) - He will be Placed on supplemental coverage with NovoLog. - Will continue Neurontin for diabetic neuropathy.  Essential hypertension - His antihypertensive therapy will be resumed.  Dyslipidemia - We will continue with Zetia. - He gets outpatient Repatha.  GERD without esophagitis - We will continue Carafate and PPI therapy.  Anxiety and depression - We will continue BuSpar and citalopram.  Gout - We will continue his colchicine  and Uloric.  BPH (benign prostatic hyperplasia) - We will continue his Flomax.   DVT prophylaxis: Lovenox. Advanced Care Planning:  Code Status: full code. Family Communication:  The plan of care was discussed in details with the patient (and family). I answered all questions. The patient agreed to proceed with the above mentioned plan. Further management will depend upon hospital course. Disposition Plan: Back to previous home environment Consults called: Cardiology All the records are reviewed and case discussed with ED provider.  Status is: Inpatient  At the time of the admission, it appears that the appropriate admission status for this patient is inpatient.  This is judged to be reasonable and necessary in order to provide the required intensity of service to ensure the patient's safety given the presenting symptoms, physical exam findings and initial radiographic and laboratory data in the context of comorbid conditions.  The patient requires inpatient status due to high intensity of service, high risk of further deterioration and high frequency of surveillance required.  I certify that at the time of admission, it is my clinical judgment that the patient will require inpatient hospital care extending more than 2 midnights.                            Dispo: The patient is from: Home              Anticipated d/c is to: Home              Patient currently is not medically stable to d/c.              Difficult to place patient: No Authorized and performed by: Valente David, MD Total critical care time:   50     minutes. Due to a high probability of clinically significant, life-threatening deterioration, the patient required my highest level of preparedness to intervene emergently and I personally spent this critical care time directly and personally managing the patient.  This critical care time included obtaining a history, examining the patient,  pulse oximetry, ordering and review of  studies, arranging urgent treatment with development of management plan, evaluation of patient's response to treatment, frequent reassessment, and discussions with other providers. This critical care time was performed to assess and manage the high probability of imminent, life-threatening deterioration that could result in multiorgan failure.  It was exclusive of separately billable procedures and treating other patients and teaching time.   Hannah Beat M.D on 04/22/2023 at 4:01 AM  [facility]   From 7 PM-7 AM, contact night-coverage [email or web address]  CC: Primary care physician; Miki Kins, FNP

## 2023-04-22 NOTE — ED Notes (Signed)
RN called into room as patient's O2 saturation was below 90%. Family had disconnected and moved [place] to tank on bed for patient to sit up and eat. Patient not visibly distressed. Extension tubing hooked to [place] and patient placed back on 3L [place]. Patient remains in NAD and eating lunch.

## 2023-04-22 NOTE — Assessment & Plan Note (Signed)
-   He will be Placed on supplemental coverage with NovoLog. - Will continue Neurontin for diabetic neuropathy.

## 2023-04-22 NOTE — Assessment & Plan Note (Signed)
-   We will continue BuSpar and citalopram.

## 2023-04-22 NOTE — Assessment & Plan Note (Signed)
-   We will continue his Flomax. 

## 2023-04-22 NOTE — Progress Notes (Signed)
PHARMACIST - PHYSICIAN COMMUNICATION  CONCERNING:  Enoxaparin (Lovenox) for DVT Prophylaxis    RECOMMENDATION: Patient was prescribed enoxaprin 40mg  q24 hours for VTE prophylaxis.   Filed Weights   04/22/23 0132  Weight: 90.3 kg (199 lb 1.2 oz)    Body mass index is 35.26 kg/m.  Estimated Creatinine Clearance: 17.1 mL/min (A) (by C-G formula based on SCr of 3.49 mg/dL (H)).  Patient is candidate for enoxaparin 30mg  every 24 hours based on CrCl <30ml/min or Weight <45kg  DESCRIPTION: Pharmacy has adjusted enoxaparin dose per [facility] policy.  Patient is now receiving enoxaparin 30 mg every 24 hours   Otelia Sergeant, PharmD, [facility] 04/22/2023 3:13 AM

## 2023-04-22 NOTE — Assessment & Plan Note (Signed)
-   We will continue with Zetia. - He gets outpatient Repatha.

## 2023-04-23 DIAGNOSIS — I509 Heart failure, unspecified: Secondary | ICD-10-CM

## 2023-04-23 DIAGNOSIS — R7881 Bacteremia: Secondary | ICD-10-CM | POA: Diagnosis not present

## 2023-04-23 DIAGNOSIS — B955 Unspecified streptococcus as the cause of diseases classified elsewhere: Secondary | ICD-10-CM | POA: Diagnosis not present

## 2023-04-23 DIAGNOSIS — J9601 Acute respiratory failure with hypoxia: Secondary | ICD-10-CM | POA: Diagnosis not present

## 2023-04-23 LAB — CBG MONITORING, ED
Glucose-Capillary: 124 mg/dL — ABNORMAL HIGH (ref 70–99)
Glucose-Capillary: 116 mg/dL — ABNORMAL HIGH (ref 70–99)
Glucose-Capillary: 158 mg/dL — ABNORMAL HIGH (ref 70–99)
Glucose-Capillary: 152 mg/dL — ABNORMAL HIGH (ref 70–99)

## 2023-04-23 LAB — CBC
HCT: 29.1 % — ABNORMAL LOW (ref 39.0–52.0)
Hemoglobin: 9.6 g/dL — ABNORMAL LOW (ref 13.0–17.0)
MCH: 31.9 pg (ref 26.0–34.0)
MCHC: 33 g/dL (ref 30.0–36.0)
MCV: 96.7 fL (ref 80.0–100.0)
Platelets: 142 10*3/uL — ABNORMAL LOW (ref 150–400)
RBC: 3.01 MIL/uL — ABNORMAL LOW (ref 4.22–5.81)
RDW: 13.2 % (ref 11.5–15.5)
WBC: 8.8 10*3/uL (ref 4.0–10.5)
nRBC: 0 % (ref 0.0–0.2)

## 2023-04-23 LAB — BASIC METABOLIC PANEL
Anion gap: 14 (ref 5–15)
BUN: 97 mg/dL — ABNORMAL HIGH (ref 8–23)
CO2: 23 mmol/L (ref 22–32)
Calcium: 8.2 mg/dL — ABNORMAL LOW (ref 8.9–10.3)
Chloride: 101 mmol/L (ref 98–111)
Creatinine, Ser: 3.99 mg/dL — ABNORMAL HIGH (ref 0.61–1.24)
GFR, Estimated: 15 mL/min — ABNORMAL LOW (ref >60)
Glucose, Bld: 157 mg/dL — ABNORMAL HIGH (ref 70–99)
Potassium: 4.1 mmol/L (ref 3.5–5.1)
Sodium: 138 mmol/L (ref 135–145)

## 2023-04-23 NOTE — Progress Notes (Addendum)
PROGRESS NOTE    [name] [name]  YNW:[id number] DOB: 29-May-1945 DOA: 04/21/2023 PCP: Miki Kins, FNP   Assessment & Plan:   Principal Problem:   Acute respiratory failure [facility]) Active Problems:   Type 2 diabetes mellitus with chronic kidney disease, without long-term current use of insulin (HCC)   Essential hypertension   Dyslipidemia   BPH (benign prostatic hyperplasia)   Gout   Anxiety and depression   GERD without esophagitis  Assessment and Plan:  Acute hypoxic respiratory failure: likely secondary to COPD and  acute on chronic diastolic CHF exacerbation. Continue on azithromycin, bronchodilators & IV lasix. Dr. [name] was consulted as per pt's family request only.   Bacteremia: blood cxs growing strep gallolyticus. Continue on IV rocephin. Source unclear. Urine cx ordered. ID consulted   AKI on CKDIV: Cr is trending up. Nephro consulted    DM2: likely poorly controlled. Continue on SSI w/ accuchecks   Peripheral neuropathy: continue on home dose of gabapentin    HTN: continue on metoprolol    HLD: continue on zetia    GERD: continue on PPI    Depression: severity unknown. Continue on home dose of buspar, citalopram    Gout: continue on febuxostat. Holding home dose of colchicine    BPH: continue on flomax   Thrombocytopenia: etiology unclear. Will continue to monitor   Obesity: BMI 35.2. Would benefit from weight loss    DVT prophylaxis: lovenox  Code Status: full  Family Communication: discussed pt's care w/ wife, Malachi Bonds, and answered her questions  Disposition Plan: depends on PT/OT recs   Level of care: Progressive  Status is: Inpatient Remains inpatient appropriate because: severity of illness    Consultants:  Cardio  Nephro ID   Procedures:   Antimicrobials: azithromycin, rocephin  Subjective: Pt c/o fatigue  Objective: Vitals:   04/22/23 2000 04/22/23 2019 04/23/23 0100 04/23/23 0200  BP: (!) 154/67  (!) 147/51 (!)  151/54  Pulse: 67  66 (!) 57  Resp:   20 14  Temp:  98.1 F (36.7 C)    TempSrc:  Oral    SpO2: 93%  95% 96%  Weight:        Intake/Output Summary (Last 24 hours) at 04/23/2023 0855 Last data filed at 04/23/2023 0600 Gross per 24 hour  Intake --  Output 750 ml  Net -750 ml   Filed Weights   04/22/23 0132  Weight: 90.3 kg    Examination:  General exam: Appears comfortable Respiratory system: diminished breath sounds b/l  Cardiovascular system: S1/S2+. No rubs or clicks. Gastrointestinal system: Abd is soft, NT, obese & hypoactive bowel sounds  Central nervous system: Alert & awake. Moves all extremities  Psychiatry: Judgement and insight appears improved. Appropriate mood and affect    Data Reviewed: I have personally reviewed following labs and imaging studies  CBC: Recent Labs  Lab 04/21/23 2352 04/22/23 0242 04/23/23 0457  WBC 7.3 8.1 8.8  NEUTROABS 6.1  --   --   HGB 11.7* 9.8* 9.6*  HCT 36.0* 30.8* 29.1*  MCV 98.9 100.3* 96.7  PLT 158 137* 142*   Basic Metabolic Panel: Recent Labs  Lab 04/21/23 2352 04/22/23 0242 04/23/23 0457  NA 138 138 138  K 4.7 4.5 4.1  CL 99 102 101  CO2 26 22 23   GLUCOSE 132* 175* 157*  BUN 76* 79* 97*  CREATININE 3.49* 3.41* 3.99*  CALCIUM 8.5* 7.8* 8.2*   GFR: Estimated Creatinine Clearance: 14.9 mL/min (A) (by C-G formula based  on SCr of 3.99 mg/dL (H)). Liver Function Tests: Recent Labs  Lab 04/21/23 2352  AST 19  ALT 15  ALKPHOS 66  BILITOT 0.7  PROT 6.9  ALBUMIN 3.3*   No results for input(s): "LIPASE", "AMYLASE" in the last 168 hours. No results for input(s): "AMMONIA" in the last 168 hours. Coagulation Profile: Recent Labs  Lab 04/21/23 2352  INR 1.1   Cardiac Enzymes: No results for input(s): "CKTOTAL", "CKMB", "CKMBINDEX", "TROPONINI" in the last 168 hours. BNP (last 3 results) No results for input(s): "PROBNP" in the last 8760 hours. HbA1C: No results for input(s): "HGBA1C" in the last 72  hours. CBG: Recent Labs  Lab 04/22/23 0912 04/22/23 1150 04/22/23 1619 04/22/23 2128 04/23/23 0743  GLUCAP 167* 257* 115* 171* 124*   Lipid Profile: No results for input(s): "CHOL", "HDL", "LDLCALC", "TRIG", "CHOLHDL", "LDLDIRECT" in the last 72 hours. Thyroid Function Tests: No results for input(s): "TSH", "T4TOTAL", "FREET4", "T3FREE", "THYROIDAB" in the last 72 hours. Anemia Panel: No results for input(s): "VITAMINB12", "FOLATE", "FERRITIN", "TIBC", "IRON", "RETICCTPCT" in the last 72 hours. Sepsis Labs: Recent Labs  Lab 04/21/23 2352 04/22/23 0242  PROCALCITON 0.22  --   LATICACIDVEN 1.0 2.3*    Recent Results (from the past 240 hours)  Blood Culture (routine x 2)     Status: None (Preliminary result)   Collection Time: 04/21/23 11:52 PM   Specimen: Left Antecubital; Blood  Result Value Ref Range Status   Specimen Description   Final    LEFT ANTECUBITAL Performed at [facility], [address] [place], [place] [address]    Special Requests   Final    BOTTLES DRAWN AEROBIC AND ANAEROBIC Blood Culture results may not be optimal due to an inadequate volume of blood received in culture bottles Performed at [facility], [address] [place], [place] [address]    Culture  Setup Time   Final    GRAM POSITIVE COCCI IN BOTH AEROBIC AND ANAEROBIC BOTTLES CRITICAL RESULT CALLED TO, READ BACK BY AND VERIFIED WITH: MADISON HUNT @1053  04/22/23 MJU GRAM STAIN REVIEWED-AGREE WITH RESULT Performed at [facility], [address], [place], [place] [address]    Culture GRAM POSITIVE COCCI  Final   Report Status PENDING  Incomplete  Blood Culture ID Panel (Reflexed)     Status: Abnormal   Collection Time: 04/21/23 11:52 PM  Result Value Ref Range Status   Enterococcus faecalis NOT DETECTED NOT DETECTED Final   Enterococcus Faecium NOT DETECTED NOT DETECTED Final   Listeria monocytogenes NOT DETECTED NOT DETECTED Final   Staphylococcus species NOT  DETECTED NOT DETECTED Final   Staphylococcus aureus (BCID) NOT DETECTED NOT DETECTED Final   Staphylococcus epidermidis NOT DETECTED NOT DETECTED Final   Staphylococcus lugdunensis NOT DETECTED NOT DETECTED Final   Streptococcus species DETECTED (A) NOT DETECTED Final    Comment: Not Enterococcus species, Streptococcus agalactiae, Streptococcus pyogenes, or Streptococcus pneumoniae. CRITICAL RESULT CALLED TO, READ BACK BY AND VERIFIED WITH: MADISON HUNT @1053  04/22/23 MJU    Streptococcus agalactiae NOT DETECTED NOT DETECTED Final   Streptococcus pneumoniae NOT DETECTED NOT DETECTED Final   Streptococcus pyogenes NOT DETECTED NOT DETECTED Final   A.calcoaceticus-baumannii NOT DETECTED NOT DETECTED Final   Bacteroides fragilis NOT DETECTED NOT DETECTED Final   Enterobacterales NOT DETECTED NOT DETECTED Final   Enterobacter cloacae complex NOT DETECTED NOT DETECTED Final   Escherichia coli NOT DETECTED NOT DETECTED Final   Klebsiella aerogenes NOT DETECTED NOT DETECTED Final  Klebsiella oxytoca NOT DETECTED NOT DETECTED Final   Klebsiella pneumoniae NOT DETECTED NOT DETECTED Final   Proteus species NOT DETECTED NOT DETECTED Final   Salmonella species NOT DETECTED NOT DETECTED Final   Serratia marcescens NOT DETECTED NOT DETECTED Final   Haemophilus influenzae NOT DETECTED NOT DETECTED Final   Neisseria meningitidis NOT DETECTED NOT DETECTED Final   Pseudomonas aeruginosa NOT DETECTED NOT DETECTED Final   Stenotrophomonas maltophilia NOT DETECTED NOT DETECTED Final   Candida albicans NOT DETECTED NOT DETECTED Final   Candida auris NOT DETECTED NOT DETECTED Final   Candida glabrata NOT DETECTED NOT DETECTED Final   Candida krusei NOT DETECTED NOT DETECTED Final   Candida parapsilosis NOT DETECTED NOT DETECTED Final   Candida tropicalis NOT DETECTED NOT DETECTED Final   Cryptococcus neoformans/gattii NOT DETECTED NOT DETECTED Final    Comment: Performed at [facility], [address] [place], [place] [address]  Resp panel by RT-PCR (RSV, Flu A&B, Covid) Anterior Nasal Swab     Status: None   Collection Time: 04/21/23 11:56 PM   Specimen: Anterior Nasal Swab  Result Value Ref Range Status   SARS Coronavirus 2 by RT PCR NEGATIVE NEGATIVE Final    Comment: (NOTE) SARS-CoV-2 target nucleic acids are NOT DETECTED.  The SARS-CoV-2 RNA is generally detectable in upper respiratory specimens during the acute phase of infection. The lowest concentration of SARS-CoV-2 viral copies this assay can detect is 138 copies/mL. A negative result does not preclude SARS-Cov-2 infection and should not be used as the sole basis for treatment or other patient management decisions. A negative result may occur with  improper specimen collection/handling, submission of specimen other than nasopharyngeal swab, presence of viral mutation(s) within the areas targeted by this assay, and inadequate number of viral copies(<138 copies/mL). A negative result must be combined with clinical observations, patient history, and epidemiological information. The expected result is Negative.  Fact Sheet for Patients:  BloggerCourse.com  Fact Sheet for Healthcare Providers:  SeriousBroker.it  This test is no t yet approved or cleared by the [place] FDA and  has been authorized for detection and/or diagnosis of SARS-CoV-2 by FDA under an Emergency Use Authorization (EUA). This EUA will remain  in effect (meaning this test can be used) for the duration of the COVID-19 declaration under Section 564(b)(1) of the Act, 21 U.S.C.section 360bbb-3(b)(1), unless the authorization is terminated  or revoked sooner.       Influenza A by PCR NEGATIVE NEGATIVE Final   Influenza B by PCR NEGATIVE NEGATIVE Final    Comment: (NOTE) The Xpert Xpress SARS-CoV-2/FLU/RSV plus assay is intended as an aid in the diagnosis of influenza from Nasopharyngeal  swab specimens and should not be used as a sole basis for treatment. Nasal washings and aspirates are unacceptable for Xpert Xpress SARS-CoV-2/FLU/RSV testing.  Fact Sheet for Patients: BloggerCourse.com  Fact Sheet for Healthcare Providers: SeriousBroker.it  This test is not yet approved or cleared by the [place] FDA and has been authorized for detection and/or diagnosis of SARS-CoV-2 by FDA under an Emergency Use Authorization (EUA). This EUA will remain in effect (meaning this test can be used) for the duration of the COVID-19 declaration under Section 564(b)(1) of the Act, 21 U.S.C. section 360bbb-3(b)(1), unless the authorization is terminated or revoked.     Resp Syncytial Virus by PCR NEGATIVE NEGATIVE Final    Comment: (NOTE) Fact Sheet for Patients: BloggerCourse.com  Fact Sheet for Healthcare Providers: SeriousBroker.it  This test is not  yet approved or cleared by the [place] and has been authorized for detection and/or diagnosis of SARS-CoV-2 by FDA under an Emergency Use Authorization (EUA). This EUA will remain in effect (meaning this test can be used) for the duration of the COVID-19 declaration under Section 564(b)(1) of the Act, 21 U.S.C. section 360bbb-3(b)(1), unless the authorization is terminated or revoked.  Performed at [facility], [address] [place], [place] [address]   Blood Culture (routine x 2)     Status: None (Preliminary result)   Collection Time: 04/22/23  9:49 PM   Specimen: BLOOD  Result Value Ref Range Status   Specimen Description BLOOD BLOOD LEFT ARM  Final   Special Requests   Final    BOTTLES DRAWN AEROBIC AND ANAEROBIC Blood Culture results may not be optimal due to an inadequate volume of blood received in culture bottles   Culture   Final    NO GROWTH < 12 HOURS Performed at [facility],  [address] [place], [place] [address]    Report Status PENDING  Incomplete         Radiology Studies: DG Chest Port 1 View Result Date: 04/22/2023 CLINICAL DATA:  Possible sepsis EXAM: PORTABLE CHEST 1 VIEW COMPARISON:  09/06/2022 FINDINGS: Cardiac shadow is prominent. 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 Aortic calcifications are noted. Lungs are well aerated bilaterally. Mild central vascular prominence is again seen without edema. No focal infiltrate is noted. No bony abnormality is seen. IMPRESSION: No acute abnormality noted. Electronically Signed   By: Alcide Clever M.D.   On: 04/22/2023 00:21        Scheduled Meds:  vitamin C  1,000 mg Oral QHS   aspirin EC  81 mg Oral Daily   calcitRIOL  0.25 mcg Oral Daily   citalopram  40 mg Oral Daily   colchicine  0.6 mg Oral Daily   enoxaparin (LOVENOX) injection  30 mg Subcutaneous Q24H   ezetimibe  10 mg Oral QHS   febuxostat  40 mg Oral Daily   gabapentin  200 mg Oral Daily   gabapentin  400 mg Oral QHS   guaiFENesin  600 mg Oral BID   insulin aspart  0-15 Units Subcutaneous TID WC   insulin aspart  0-5 Units Subcutaneous QHS   ipratropium-albuterol  3 mL Nebulization QID   metoprolol succinate  50 mg Oral Daily   multivitamin  1 tablet Oral BID   pantoprazole  40 mg Oral Daily   predniSONE  40 mg Oral Q breakfast   tamsulosin  0.4 mg Oral Daily   Continuous Infusions:  azithromycin     cefTRIAXone (ROCEPHIN)  IV 2 g (04/23/23 0819)     LOS: 1 day      Charise Killian, MD [facility] Pager [phone]  If 7PM-7AM, please contact night-coverage [email or web address] 04/23/2023, 8:55 AM

## 2023-04-23 NOTE — Consult Note (Signed)
NAME: [name] [name]  DOB: [birth year]  MRN: [id number]  Date/Time: 04/23/2023 6:38 PM  REQUESTING PROVIDER: Dr.[email or web address] Subjective:  REASON FOR CONSULT: bacteremia ? [name] [name] is a 77 y.o. with a history of CKD, gout, DM, CAD, HFrEF, hypothyroidism, copd, HTN, CVA, TAVR Presented to the ED on 04/21/23 with acute shortness of breath. He was having chills and had to use 2 blankets. EMS documented temp of 100.6.   04/21/23  BP 164/121 (H)  Temp 100.6 F (38.1 C) !  Pulse Rate 95  Resp 18  SpO2 100 %     Latest Reference Range & Units 04/21/23  WBC 4.0 - 10.5 K/uL 7.3  Hemoglobin 13.0 - 17.0 g/dL 46.9 (L)  HCT 62.9 - 52.8 % 36.0 (L)  Platelets 150 - 400 K/uL 158  Creatinine 0.61 - 1.24 mg/dL 4.13 (H)  Blood culture sent I am seeing the patient as culture positive for streptococcus gallolyticus  Past Medical History:  Diagnosis Date   Accidental cut, puncture, perforation, or hemorrhage during heart catheterization 12/27/2012   Acute postoperative pain 08/02/2016   Acute renal failure (ARF) (HCC)    AKI (acute kidney injury) (HCC) 07/27/2020   ANA positive 07/12/2015   Aortic stenosis, 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 post. helix. 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   CAD (coronary artery disease)    CAP (community acquired pneumonia) 07/26/2020   CKD (chronic kidney disease), stage IV (HCC)    Complete tear of right rotator cuff 11/15/2015   Complex tear of lateral meniscus of left knee as current injury 12/06/2020   Complex tear of medial meniscus of left knee as current injury 10/18/2020   COPD (chronic obstructive pulmonary disease) (HCC)    DM (diabetes mellitus), type 2 (HCC) 07/30/2020   no meds   DOE (dyspnea on exertion)    GERD (gastroesophageal  reflux disease)    Gout    Grade I diastolic dysfunction    Heart murmur    HFrEF (heart failure with reduced ejection fraction) (HCC)    History of hiatal hernia    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  Performed by Dr. Zebedee Iba on 08/01/16:  29 mm commercially available Medtronic Core Valve transcatheter aortic valve procedure placed via transfemoral approach by Dr. Zebedee Iba and co-surgeon Dr. Winona Legato.   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 MOHS.   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/07/2021   Right zygoma, EDC 05/02/21   Supplemental oxygen dependent    3-4  L/[place]   Supplemental oxygen dependent    T2DM (type 2 diabetes mellitus) (HCC)    no meds    Past Surgical History:  Procedure Laterality Date   AORTIC VALVE REPLACEMENT N/A 08/01/2016   29 mm CoreValve Evolut; Location: [facility]; Surgeon: Clent Jacks, MD   CARDIAC CATHETERIZATION N/A 12/26/2012   2v CAD; retained pigtail in myocardium --> transferred to [facility]; Location: [facility]; Surgeon: Despina Hick, MD   CATARACT EXTRACTION W/PHACO Left 10/03/2022   Procedure: CATARACT EXTRACTION PHACO AND INTRAOCULAR LENS PLACEMENT (IOC) LEFT DIABETIC 8.22 00:55.5;  Surgeon: Galen Manila, MD;  Location: [facility];  Service:  Ophthalmology;  Laterality: Left;  sleep apnea   CATARACT EXTRACTION W/PHACO Right 10/17/2022   Procedure: CATARACT EXTRACTION PHACO AND INTRAOCULAR LENS PLACEMENT (IOC) RIGHT DIABETIC;  Surgeon: Galen Manila, MD;  Location: [facility];  Service: Ophthalmology;  Laterality: Right;  4.79 0:37.4   COLONOSCOPY     KNEE ARTHROSCOPY WITH MEDIAL MENISECTOMY Left 12/02/2020   Procedure: KNEE ARTHROSCOPY WITH DEBRIDEMENT AND PARTIAL MEDIAL AND LATERAL MENISECTOMY;  Surgeon: Christena Flake, MD;  Location: [facility];  Service: Orthopedics;  Laterality: Left;   LITHOTRIPSY     PERCUTANEOUS REMOVAL INTRA-AORTIC BALLOON CATH N/A 12/26/2012   Procedure: PERCUTANEOUS REMOVAL INTRA-AORTIC BALLOON CATH; Surgeon: Heloise Ochoa, MD; Location: [facility]; Service: Cardiothoracic   RIGHT HEART CATH Right 07/13/2016   Location: [facility]; Surgeon: Emilio Math, MD   TRANSESOPHAGEAL ECHOCARDIOGRAM N/A 12/26/2012   Procedure: TRANSESOPHAGEAL ECHOCARDIOGRAPHY; Surgeon: Heloise Ochoa, MD; Location: [facility]; Service: Cardiothoracic   UMBILICAL HERNIA REPAIR N/A 02/13/2014   Procedure: LAPAROSCOPIC UMBILICAL HERNIA REPAIR; Surgeon: Merlinda Frederick, MD; Location: [facility]; Service: General Surgery    Social History   Socioeconomic History   Marital status: Married    Spouse name: Masun Merson   Number of children: 2   Years of education: Not on file   Highest education level: Not on file  Occupational History   Occupation: retired  Tobacco Use   Smoking status: Former    Current packs/day: 0.00    Average packs/day: 2.0 packs/day for 54.0 years (108.0 ttl pk-yrs)    Types: Cigarettes    Start date: 59    Quit date: 2010    Years since quitting: 15.0   Smokeless tobacco: Former    Types: Chew    Quit date: 05/16/2004  Vaping Use   Vaping status: Never Used  Substance and Sexual Activity   Alcohol use: No   Drug use: Never   Sexual activity: Not on  file  Other Topics Concern   Not on file  Social History Narrative   Not on file   Social Drivers of Health   Financial Resource Strain: Low Risk  (03/09/2023)   Overall Financial Resource Strain (CARDIA)    Difficulty of Paying Living Expenses: Not hard at all  Food Insecurity: No Food Insecurity (03/09/2023)   Hunger Vital Sign    Worried About Running Out of Food in the Last Year: Never true    Ran Out of Food in the Last Year: Never true  Transportation Needs: No Transportation Needs (03/09/2023)   PRAPARE - Administrator, Civil Service (Medical): No    Lack of Transportation (Non-Medical): No  Physical Activity: Inactive (03/09/2023)   Exercise Vital Sign    Days of Exercise per Week: 0 days    Minutes of Exercise per Session: 0 min  Stress: No Stress Concern Present (03/09/2023)  [employer] of Occupational Health - Occupational Stress Questionnaire    Feeling of Stress : Not at all  Social Connections: Unknown (03/09/2023)   Social Connection and Isolation Panel [NHANES]    Frequency of Communication with Friends and Family: More than three times a week    Frequency of Social Gatherings with Friends and Family: More than three times a week    Attends Religious Services: More than 4 times per year    Active Member of [employer] or Organizations: Not on file    Attends Banker Meetings: Not on file    Marital Status: Married  Intimate Partner Violence: Not At Risk (03/09/2023)   Humiliation, Afraid, Rape, and Kick questionnaire    Fear of Current or Ex-Partner: No    Emotionally Abused: No    Physically Abused: No    Sexually Abused: No    Family History  Adopted: Yes  Problem Relation Age of Onset   Varicose Veins Daughter    Obesity Daughter    Hyperlipidemia Daughter    Diabetes Daughter    COPD Daughter    Depression Daughter    Asthma Daughter    Arthritis Son    Diabetes Son    Hypertension Son    Allergies  Allergen  Reactions   Nsaids Other (See Comments)    Avoid due to kidney disease    Statins Other (See Comments)    Myalgias   Nexletol [Bempedoic Acid] Diarrhea    fatigue   Silver Other (See Comments)   Tegaderm High Gelling Alginate [Wound Dressings] Rash   I? Current Facility-Administered Medications  Medication Dose Route Frequency Provider Last Rate Last Admin   acetaminophen (TYLENOL) tablet 650 mg  650 mg Oral Q6H PRN Mansy, Jan A, MD   650 mg at 04/22/23 0352   Or   acetaminophen (TYLENOL) suppository 650 mg  650 mg Rectal Q6H PRN Mansy, Jan A, MD       ascorbic acid (VITAMIN C) tablet 1,000 mg  1,000 mg Oral QHS Mansy, Jan A, MD   1,000 mg at 04/22/23 2129   aspirin EC tablet 81 mg  81 mg Oral Daily Mansy, Jan A, MD   81 mg at 04/23/23 0914   azithromycin (ZITHROMAX) 500 mg in sodium chloride 0.9 % 250 mL IVPB  500 mg Intravenous Q24H Charise Killian, MD   Stopped at 04/23/23 1149   busPIRone (BUSPAR) tablet 5 mg  5 mg Oral BID PRN Mansy, Jan A, MD   5 mg at 04/22/23 2133   calcitRIOL (ROCALTROL) capsule 0.25 mcg  0.25 mcg Oral Daily Mansy, Jan A, MD   0.25 mcg at 04/23/23 0914   cefTRIAXone (ROCEPHIN) 2 g in sodium chloride 0.9 % 100 mL IVPB  2 g Intravenous Q24H Charise Killian, MD   Stopped at 04/23/23 4098   chlorpheniramine-HYDROcodone (TUSSIONEX) 10-8 MG/5ML suspension 5 mL  5 mL Oral Q12H PRN Mansy, Jan A, MD       citalopram (CELEXA) tablet 40 mg  40 mg Oral Daily Mansy, Jan A, MD   40 mg at 04/23/23 0914   enoxaparin (LOVENOX) injection 30 mg  30 mg Subcutaneous Q24H Mansy, Jan A, MD   30 mg at 04/23/23 0915   ezetimibe (ZETIA) tablet 10 mg  10 mg Oral QHS Mansy, Jan A, MD   10 mg at 04/22/23 2130   febuxostat (ULORIC) tablet 40 mg  40 mg Oral Daily Mansy, Vernetta Honey, MD   40 mg  at 04/23/23 0914   gabapentin (NEURONTIN) capsule 200 mg  200 mg Oral Daily Mansy, Jan A, MD   200 mg at 04/23/23 0913   gabapentin (NEURONTIN) capsule 400 mg  400 mg Oral QHS Mansy, Jan A, MD   400 mg  at 04/22/23 2129   guaiFENesin (MUCINEX) 12 hr tablet 600 mg  600 mg Oral BID Mansy, Jan A, MD   600 mg at 04/23/23 0914   insulin aspart (novoLOG) injection 0-15 Units  0-15 Units Subcutaneous TID WC Mansy, Vernetta Honey, MD   2 Units at 04/23/23 1729   insulin aspart (novoLOG) injection 0-5 Units  0-5 Units Subcutaneous QHS Mansy, Jan A, MD       ipratropium-albuterol (DUONEB) 0.5-2.5 (3) MG/3ML nebulizer solution 3 mL  3 mL Nebulization QID Mansy, Jan A, MD   3 mL at 04/23/23 1729   ipratropium-albuterol (DUONEB) 0.5-2.5 (3) MG/3ML nebulizer solution 3 mL  3 mL Nebulization Q4H PRN Belue, Lendon Collar, RPH       loratadine (CLARITIN) tablet 10 mg  10 mg Oral Daily PRN Mansy, Jan A, MD       magnesium hydroxide (MILK OF MAGNESIA) suspension 30 mL  30 mL Oral Daily PRN Mansy, Jan A, MD       metoprolol succinate (TOPROL-XL) 24 hr tablet 50 mg  50 mg Oral Daily Mansy, Jan A, MD   50 mg at 04/23/23 0914   multivitamin (PROSIGHT) tablet 1 tablet  1 tablet Oral BID Mansy, Jan A, MD   1 tablet at 04/23/23 0914   ondansetron [facility]) tablet 4 mg  4 mg Oral Q6H PRN Mansy, Jan A, MD       Or   ondansetron [facility]) injection 4 mg  4 mg Intravenous Q6H PRN Mansy, Jan A, MD       pantoprazole (PROTONIX) EC tablet 40 mg  40 mg Oral Daily Mansy, Jan A, MD   40 mg at 04/23/23 0914   predniSONE (DELTASONE) tablet 40 mg  40 mg Oral Q breakfast Mansy, Jan A, MD   40 mg at 04/23/23 0914   tamsulosin [facility]) capsule 0.4 mg  0.4 mg Oral Daily Mansy, Jan A, MD   0.4 mg at 04/23/23 0914   traZODone (DESYREL) tablet 25 mg  25 mg Oral QHS PRN Mansy, Jan A, MD   25 mg at 04/22/23 2130   Current Outpatient Medications  Medication Sig Dispense Refill   acetaminophen (TYLENOL) 650 MG CR tablet Take 1,300 mg by mouth every 8 (eight) hours as needed for pain.     albuterol (VENTOLIN HFA) 108 (90 Base) MCG/ACT inhaler Inhale 2 puffs into the lungs every 6 (six) hours as needed for wheezing or shortness of breath.     Ascorbic Acid  (VITAMIN C) 1000 MG tablet Take 1,000 mg by mouth at bedtime.     aspirin EC 81 MG EC tablet Take 1 tablet (81 mg total) by mouth daily.     busPIRone (BUSPAR) 5 MG tablet TAKE 1 TABLET BY MOUTH TWICE DAILY AS NEEDED 60 tablet 3   calcitRIOL (ROCALTROL) 0.25 MCG capsule Take 0.25 mcg by mouth daily.     citalopram (CELEXA) 40 MG tablet TAKE 1 TABLET EVERY DAY 90 tablet 3   colchicine 0.6 MG tablet Take 0.3 mg by mouth daily.     Evolocumab (REPATHA SURECLICK) 140 MG/ML SOAJ INJECT CONTENT OF 1 CARTRIDGE UNDER THE SKIN EVERY OTHER WEEK 90 mL 1   ezetimibe (ZETIA) 10 MG tablet  TAKE 1 TABLET(10 MG) BY MOUTH AT BEDTIME 90 tablet 3   febuxostat (ULORIC) 40 MG tablet Take 40 mg by mouth daily.     fexofenadine (ALLEGRA) 180 MG tablet Take 180 mg by mouth daily as needed for allergies or rhinitis.     Fluticasone-Umeclidin-Vilant (TRELEGY ELLIPTA) 100-62.5-25 MCG/ACT AEPB Inhale 1 Inhalation into the lungs daily at 6 (six) AM. (Patient taking differently: Inhale 1 Inhalation into the lungs daily as needed.) 60 each 11   gabapentin (NEURONTIN) 100 MG capsule TAKE 2 CAPSULES TWICE DAILY 360 capsule 3   gabapentin (NEURONTIN) 400 MG capsule TAKE 1 CAPSULE AT BEDTIME 90 capsule 3   metoprolol succinate (TOPROL-XL) 50 MG 24 hr tablet TAKE 1 TABLET EVERY DAY 90 tablet 3   Multiple Vitamins-Minerals (PRESERVISION AREDS) CAPS Take 1 capsule by mouth 2 (two) times daily.     ondansetron (ZOFRAN-ODT) 4 MG disintegrating tablet Take 1 tablet (4 mg total) by mouth every 8 (eight) hours as needed for nausea or vomiting. 20 tablet 0   pantoprazole (PROTONIX) 40 MG tablet Take 40 mg by mouth every morning.     Simethicone (GAS-X PO) Take 1 tablet by mouth 2 (two) times daily as needed (flatulence).     sodium chloride (OCEAN) 0.65 % SOLN nasal spray Place 1 spray into both nostrils as needed for congestion.  0   SUPER B COMPLEX/C PO Take 1 tablet by mouth daily.     tamsulosin (FLOMAX) 0.4 MG CAPS capsule TAKE 1  CAPSULE EVERY DAY 90 capsule 3   torsemide (DEMADEX) 20 MG tablet Take 20 mg by mouth daily.     Alcohol Swabs (DROPSAFE ALCOHOL PREP) 70 % PADS Apply 1 each topically as needed (With repatha and blood sugar checks.).     ipratropium-albuterol (DUONEB) 0.5-2.5 (3) MG/3ML SOLN Take 3 mLs by nebulization every 4 (four) hours as needed. (Patient not taking: Reported on 04/22/2023) 360 mL 3   OXYGEN Inhale 3 L into the lungs See admin instructions. Used as needed throughout the day and continuous at bedtime     sucralfate (CARAFATE) 1 g tablet Take 1 tablet (1 g total) by mouth 4 (four) times daily. (Patient not taking: Reported on 04/22/2023) 120 tablet 1     Abtx:  Anti-infectives (From admission, onward)    Start     Dose/Rate Route Frequency Ordered Stop   04/23/23 1000  azithromycin (ZITHROMAX) 500 mg in sodium chloride 0.9 % 250 mL IVPB        500 mg 250 mL/hr over 60 Minutes Intravenous Every 24 hours 04/22/23 0910     04/23/23 0800  cefTRIAXone (ROCEPHIN) 2 g in sodium chloride 0.9 % 100 mL IVPB        2 g 200 mL/hr over 30 Minutes Intravenous Every 24 hours 04/22/23 1139     04/22/23 2200  cefTRIAXone (ROCEPHIN) 1 g in sodium chloride 0.9 % 100 mL IVPB  Status:  Discontinued        1 g 200 mL/hr over 30 Minutes Intravenous Every 24 hours 04/22/23 0138 04/22/23 0910   04/22/23 0115  cefTRIAXone (ROCEPHIN) 2 g in sodium chloride 0.9 % 100 mL IVPB        2 g 200 mL/hr over 30 Minutes Intravenous  Once 04/22/23 0114 04/22/23 0236   04/22/23 0115  azithromycin (ZITHROMAX) 500 mg in sodium chloride 0.9 % 250 mL IVPB        500 mg 250 mL/hr over 60 Minutes Intravenous  Once 04/22/23  0114 04/22/23 0321       REVIEW OF SYSTEMS:  Const:  fever,  chills, negative weight loss Eyes: negative diplopia or visual changes, negative eye pain ENT: negative coryza, negative sore throat Resp: negative cough, hemoptysis, has dyspnea Cards: negative for chest pain, palpitations, lower extremity  edema GU: negative for frequency, dysuria and hematuria GI: Negative for abdominal pain, diarrhea, bleeding, constipation Skin: negative for rash and pruritus Heme: negative for easy bruising and gum/nose bleeding MS:  weakness Neurolo:negative for headaches, dizziness, vertigo, memory problems  Psych: negative for feelings of anxiety, depression  Endocrine: diet controlled diabetes Allergy/Immunology- as above Objective:  VITALS:  BP (!) 145/77   Pulse 77   Temp (!) 96.5 F (35.8 C) (Axillary)   Resp (!) 21   Wt 90.3 kg Comment: From O/V note 04/03/23  SpO2 96%   BMI 35.26 kg/m   PHYSICAL EXAM:  General: Alert, cooperative, no distress, appears stated age.  Head: Normocephalic, without obvious abnormality, atraumatic. Eyes: Conjunctivae clear, anicteric sclerae. Pupils are equal ENT Nares normal. No drainage or sinus tenderness. Lips, mucosa, and tongue normal. No Thrush Neck: Supple, symmetrical, no adenopathy, thyroid: non tender no carotid bruit and no JVD. Back: No CVA tenderness. Lungs: b/l air entry Heart: Regular rate and rhythm, no murmur, rub or gallop. Abdomen: Soft, non-tender,not distended. Bowel sounds normal. No masses Extremities: atraumatic, no cyanosis. No edema. No clubbing Skin:  bruising Lymph: Cervical, supraclavicular normal. Neurologic: Grossly non-focal Pertinent Labs Lab Results CBC    Component Value Date/Time   WBC 8.8 04/23/2023 0457   RBC 3.01 (L) 04/23/2023 0457   HGB 9.6 (L) 04/23/2023 0457   HGB 10.7 (L) 11/28/2022 1050   HCT 29.1 (L) 04/23/2023 0457   HCT 32.3 (L) 11/28/2022 1050   PLT 142 (L) 04/23/2023 0457   PLT 125 (L) 11/28/2022 1050   MCV 96.7 04/23/2023 0457   MCV 95 11/28/2022 1050   MCV 96 07/27/2014 1247   MCH 31.9 04/23/2023 0457   MCHC 33.0 04/23/2023 0457   RDW 13.2 04/23/2023 0457   RDW 12.5 11/28/2022 1050   RDW 13.1 07/27/2014 1247   LYMPHSABS 0.5 (L) 04/21/2023 2352   LYMPHSABS 1.5 11/28/2022 1050    LYMPHSABS 1.8 07/27/2014 1247   MONOABS 0.5 04/21/2023 2352   MONOABS 0.6 07/27/2014 1247   EOSABS 0.0 04/21/2023 2352   EOSABS 0.0 11/28/2022 1050   EOSABS 0.0 07/27/2014 1247   BASOSABS 0.0 04/21/2023 2352   BASOSABS 0.0 11/28/2022 1050   BASOSABS 0.0 07/27/2014 1247   BASOSABS 0 02/03/2012 0318       Latest Ref Rng & Units 04/23/2023    4:57 AM 04/22/2023    2:42 AM 04/21/2023   11:52 PM  CMP  Glucose 70 - 99 mg/dL 811  914  782   BUN 8 - 23 mg/dL 97  79  76   Creatinine 0.61 - 1.24 mg/dL 9.56  2.13  0.86   Sodium 135 - 145 mmol/L 138  138  138   Potassium 3.5 - 5.1 mmol/L 4.1  4.5  4.7   Chloride 98 - 111 mmol/L 101  102  99   CO2 22 - 32 mmol/L 23  22  26    Calcium 8.9 - 10.3 mg/dL 8.2  7.8  8.5   Total Protein 6.5 - 8.1 g/dL   6.9   Total Bilirubin <1.2 mg/dL   0.7   Alkaline Phos 38 - 126 U/L   66   AST 15 -  41 U/L   19   ALT 0 - 44 U/L   15       Microbiology: Recent Results (from the past 240 hours)  Blood Culture (routine x 2)     Status: Abnormal (Preliminary result)   Collection Time: 04/21/23 11:52 PM   Specimen: Left Antecubital; Blood  Result Value Ref Range Status   Specimen Description   Final    LEFT ANTECUBITAL Performed at [facility], [address], [place], [place] [address]    Special Requests   Final    BOTTLES DRAWN AEROBIC AND ANAEROBIC Blood Culture results may not be optimal due to an inadequate volume of blood received in culture bottles Performed at [facility], [address] [place], [place] [address]    Culture  Setup Time   Final    GRAM POSITIVE COCCI IN BOTH AEROBIC AND ANAEROBIC BOTTLES CRITICAL RESULT CALLED TO, READ BACK BY AND VERIFIED WITH: MADISON HUNT @1053  04/22/23 MJU GRAM STAIN REVIEWED-AGREE WITH RESULT    Culture (A)  Final    STREPTOCOCCUS GALLOLYTICUS THE SIGNIFICANCE OF ISOLATING THIS ORGANISM FROM A SINGLE SET OF BLOOD CULTURES WHEN MULTIPLE SETS ARE DRAWN IS UNCERTAIN. PLEASE NOTIFY  THE MICROBIOLOGY DEPARTMENT WITHIN ONE WEEK IF SPECIATION AND SENSITIVITIES ARE REQUIRED. Performed at [facility], [address], [place], [place] [address]    Report Status PENDING  Incomplete  Blood Culture ID Panel (Reflexed)     Status: Abnormal   Collection Time: 04/21/23 11:52 PM  Result Value Ref Range Status   Enterococcus faecalis NOT DETECTED NOT DETECTED Final   Enterococcus Faecium NOT DETECTED NOT DETECTED Final   Listeria monocytogenes NOT DETECTED NOT DETECTED Final   Staphylococcus species NOT DETECTED NOT DETECTED Final   Staphylococcus aureus (BCID) NOT DETECTED NOT DETECTED Final   Staphylococcus epidermidis NOT DETECTED NOT DETECTED Final   Staphylococcus lugdunensis NOT DETECTED NOT DETECTED Final   Streptococcus species DETECTED (A) NOT DETECTED Final    Comment: Not Enterococcus species, Streptococcus agalactiae, Streptococcus pyogenes, or Streptococcus pneumoniae. CRITICAL RESULT CALLED TO, READ BACK BY AND VERIFIED WITH: MADISON HUNT @1053  04/22/23 MJU    Streptococcus agalactiae NOT DETECTED NOT DETECTED Final   Streptococcus pneumoniae NOT DETECTED NOT DETECTED Final   Streptococcus pyogenes NOT DETECTED NOT DETECTED Final   A.calcoaceticus-baumannii NOT DETECTED NOT DETECTED Final   Bacteroides fragilis NOT DETECTED NOT DETECTED Final   Enterobacterales NOT DETECTED NOT DETECTED Final   Enterobacter cloacae complex NOT DETECTED NOT DETECTED Final   Escherichia coli NOT DETECTED NOT DETECTED Final   Klebsiella aerogenes NOT DETECTED NOT DETECTED Final   Klebsiella oxytoca NOT DETECTED NOT DETECTED Final   Klebsiella pneumoniae NOT DETECTED NOT DETECTED Final   Proteus species NOT DETECTED NOT DETECTED Final   Salmonella species NOT DETECTED NOT DETECTED Final   Serratia marcescens NOT DETECTED NOT DETECTED Final   Haemophilus influenzae NOT DETECTED NOT DETECTED Final   Neisseria meningitidis NOT DETECTED NOT DETECTED Final   Pseudomonas  aeruginosa NOT DETECTED NOT DETECTED Final   Stenotrophomonas maltophilia NOT DETECTED NOT DETECTED Final   Candida albicans NOT DETECTED NOT DETECTED Final   Candida auris NOT DETECTED NOT DETECTED Final   Candida glabrata NOT DETECTED NOT DETECTED Final   Candida krusei NOT DETECTED NOT DETECTED Final   Candida parapsilosis NOT DETECTED NOT DETECTED Final   Candida tropicalis NOT DETECTED NOT DETECTED Final   Cryptococcus neoformans/gattii NOT DETECTED NOT DETECTED Final    Comment: Performed  at [facility], [address] [place], [place] [address]  Resp panel by RT-PCR (RSV, Flu A&B, Covid) Anterior Nasal Swab     Status: None   Collection Time: 04/21/23 11:56 PM   Specimen: Anterior Nasal Swab  Result Value Ref Range Status   SARS Coronavirus 2 by RT PCR NEGATIVE NEGATIVE Final    Comment: (NOTE) SARS-CoV-2 target nucleic acids are NOT DETECTED.  The SARS-CoV-2 RNA is generally detectable in upper respiratory specimens during the acute phase of infection. The lowest concentration of SARS-CoV-2 viral copies this assay can detect is 138 copies/mL. A negative result does not preclude SARS-Cov-2 infection and should not be used as the sole basis for treatment or other patient management decisions. A negative result may occur with  improper specimen collection/handling, submission of specimen other than nasopharyngeal swab, presence of viral mutation(s) within the areas targeted by this assay, and inadequate number of viral copies(<138 copies/mL). A negative result must be combined with clinical observations, patient history, and epidemiological information. The expected result is Negative.  Fact Sheet for Patients:  BloggerCourse.com  Fact Sheet for Healthcare Providers:  SeriousBroker.it  This test is no t yet approved or cleared by the [place] FDA and  has been authorized for detection and/or diagnosis of  SARS-CoV-2 by FDA under an Emergency Use Authorization (EUA). This EUA will remain  in effect (meaning this test can be used) for the duration of the COVID-19 declaration under Section 564(b)(1) of the Act, 21 U.S.C.section 360bbb-3(b)(1), unless the authorization is terminated  or revoked sooner.       Influenza A by PCR NEGATIVE NEGATIVE Final   Influenza B by PCR NEGATIVE NEGATIVE Final    Comment: (NOTE) The Xpert Xpress SARS-CoV-2/FLU/RSV plus assay is intended as an aid in the diagnosis of influenza from Nasopharyngeal swab specimens and should not be used as a sole basis for treatment. Nasal washings and aspirates are unacceptable for Xpert Xpress SARS-CoV-2/FLU/RSV testing.  Fact Sheet for Patients: BloggerCourse.com  Fact Sheet for Healthcare Providers: SeriousBroker.it  This test is not yet approved or cleared by the [place] FDA and has been authorized for detection and/or diagnosis of SARS-CoV-2 by FDA under an Emergency Use Authorization (EUA). This EUA will remain in effect (meaning this test can be used) for the duration of the COVID-19 declaration under Section 564(b)(1) of the Act, 21 U.S.C. section 360bbb-3(b)(1), unless the authorization is terminated or revoked.     Resp Syncytial Virus by PCR NEGATIVE NEGATIVE Final    Comment: (NOTE) Fact Sheet for Patients: BloggerCourse.com  Fact Sheet for Healthcare Providers: SeriousBroker.it  This test is not yet approved or cleared by the [place] FDA and has been authorized for detection and/or diagnosis of SARS-CoV-2 by FDA under an Emergency Use Authorization (EUA). This EUA will remain in effect (meaning this test can be used) for the duration of the COVID-19 declaration under Section 564(b)(1) of the Act, 21 U.S.C. section 360bbb-3(b)(1), unless the authorization is terminated  or revoked.  Performed at [facility], [address] [place], [place] [address]   Blood Culture (routine x 2)     Status: None (Preliminary result)   Collection Time: 04/22/23  9:49 PM   Specimen: BLOOD  Result Value Ref Range Status   Specimen Description BLOOD BLOOD LEFT ARM  Final   Special Requests   Final    BOTTLES DRAWN AEROBIC AND ANAEROBIC Blood Culture results may not be optimal due to an inadequate volume of  blood received in culture bottles   Culture   Final    NO GROWTH < 12 HOURS Performed at [facility], 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

## 2023-04-23 NOTE — ED Notes (Signed)
This nurse gave pt a cup of water for denture per pt request

## 2023-04-23 NOTE — ED Notes (Signed)
Pt is resting in bed with his CPAP on and eyes closed. Respirations are even and unlabored. NAD observed.

## 2023-04-23 NOTE — ED Notes (Signed)
This RN has called CCMD in regards to the monitor in this room due to it not being displayed with patient being connected to leads. CCMD had this nurse attempt to admit and readmit patient, reset machine, and detach and reattach leads. All efforts to fix monitor have been taken, unfortunately, the monitor is still showing an error and is not turning on. At this time, patient is not being monitored due to the malfunction of the cardiac monitor. Charge RN has been notified and is on the way to [facility] to assist in getting it to work.

## 2023-04-23 NOTE — ED Notes (Signed)
Pt was assisted from the bhed to the recliner. Pt was given diet Cola and 1 warm blanket.

## 2023-04-23 NOTE — ED Notes (Signed)
This Rn assumed care of patient at 16. At this time the "Refer to Sidebar Report Refer to ICU, Med Surg, Progressive, and Step Down Mobility protocol Sidebars" is overdue. This RN was not present at facility during its due time and this [name] [name] is unsure as to why it was not completed during its initial time of being due. At this time, this RN will complete tasks due during the assigned shift but is unable to complete items due prior to this RN being assigned to said patient; charge RN made aware of outstanding items and current situation.

## 2023-04-23 NOTE — ED Notes (Addendum)
Pt washed with soapy wipes. Changed into fresh pajamas per pt's preference. Pt brushed his teeth.

## 2023-04-24 ENCOUNTER — Inpatient Hospital Stay (HOSPITAL_COMMUNITY)
Admit: 2023-04-24 | Discharge: 2023-04-24 | Disposition: A | Discharge status: Home / Self Care | Payer: Medicare HMO | Attending: Infectious Diseases | Admitting: Infectious Diseases

## 2023-04-24 DIAGNOSIS — R7881 Bacteremia: Secondary | ICD-10-CM

## 2023-04-24 DIAGNOSIS — J9621 Acute and chronic respiratory failure with hypoxia: Secondary | ICD-10-CM

## 2023-04-24 DIAGNOSIS — B955 Unspecified streptococcus as the cause of diseases classified elsewhere: Secondary | ICD-10-CM | POA: Diagnosis not present

## 2023-04-24 DIAGNOSIS — I050 Rheumatic mitral stenosis: Secondary | ICD-10-CM

## 2023-04-24 DIAGNOSIS — I38 Endocarditis, valve unspecified: Secondary | ICD-10-CM

## 2023-04-24 DIAGNOSIS — J9601 Acute respiratory failure with hypoxia: Secondary | ICD-10-CM | POA: Diagnosis not present

## 2023-04-24 DIAGNOSIS — I509 Heart failure, unspecified: Secondary | ICD-10-CM

## 2023-04-24 HISTORY — DX: Unspecified streptococcus as the cause of diseases classified elsewhere: B95.5

## 2023-04-24 HISTORY — DX: Rheumatic mitral stenosis: I05.0

## 2023-04-24 LAB — BASIC METABOLIC PANEL
Anion gap: 13 (ref 5–15)
BUN: 97 mg/dL — ABNORMAL HIGH (ref 8–23)
CO2: 21 mmol/L — ABNORMAL LOW (ref 22–32)
Calcium: 7.9 mg/dL — ABNORMAL LOW (ref 8.9–10.3)
Chloride: 102 mmol/L (ref 98–111)
Creatinine, Ser: 3.85 mg/dL — ABNORMAL HIGH (ref 0.61–1.24)
GFR, Estimated: 15 mL/min — ABNORMAL LOW (ref >60)
Glucose, Bld: 141 mg/dL — ABNORMAL HIGH (ref 70–99)
Potassium: 4.2 mmol/L (ref 3.5–5.1)
Sodium: 136 mmol/L (ref 135–145)

## 2023-04-24 LAB — ECHOCARDIOGRAM COMPLETE
AR max vel: 1.83 cm2
AV Area VTI: 1.76 cm2
AV Area mean vel: 1.71 cm2
AV Mean grad: 12 mm[Hg]
AV Peak grad: 21.1 mm[Hg]
Ao pk vel: 2.3 m/s
Area-P 1/2: 4.6 cm2
Calc EF: 43.4 %
Est EF: 50
MV M vel: 5.83 m/s
MV Peak grad: 136 mm[Hg]
MV VTI: 1.86 cm2
Radius: 0.3 cm
S' Lateral: 4 cm
Single Plane A2C EF: 47.7 %
Single Plane A4C EF: 44.2 %
Weight: 3185.21 [oz_av]

## 2023-04-24 LAB — CBC
HCT: 27 % — ABNORMAL LOW (ref 39.0–52.0)
Hemoglobin: 9.1 g/dL — ABNORMAL LOW (ref 13.0–17.0)
MCH: 31.8 pg (ref 26.0–34.0)
MCHC: 33.7 g/dL (ref 30.0–36.0)
MCV: 94.4 fL (ref 80.0–100.0)
Platelets: 148 10*3/uL — ABNORMAL LOW (ref 150–400)
RBC: 2.86 MIL/uL — ABNORMAL LOW (ref 4.22–5.81)
RDW: 13.3 % (ref 11.5–15.5)
WBC: 9.6 10*3/uL (ref 4.0–10.5)
nRBC: 0 % (ref 0.0–0.2)

## 2023-04-24 LAB — CBG MONITORING, ED
Glucose-Capillary: 112 mg/dL — ABNORMAL HIGH (ref 70–99)
Glucose-Capillary: 124 mg/dL — ABNORMAL HIGH (ref 70–99)
Glucose-Capillary: 175 mg/dL — ABNORMAL HIGH (ref 70–99)

## 2023-04-24 LAB — GLUCOSE, CAPILLARY: Glucose-Capillary: 103 mg/dL — ABNORMAL HIGH (ref 70–99)

## 2023-04-24 MED ORDER — DOCUSATE SODIUM 100 MG PO CAPS
200.0000 mg | ORAL_CAPSULE | Freq: Two times a day (BID) | ORAL | Status: DC
  Administered 2023-04-24 – 2023-04-27 (×6): 200 mg via ORAL
  Filled 2023-04-24 (×7): qty 2

## 2023-04-24 NOTE — Progress Notes (Signed)
 PROGRESS NOTE   HPI was taken from Dr. Lawence: [name] [name] is a 77 y.o. male with medical history significant for systolic CHF, coronary artery disease, GERD, gout, dyslipidemia, OSA on CPAP, and stage IV chronic kidney disease, who presented to the emergency room with acute onset of worsening dyspnea with associated orthopnea.  The patient's wife noted that his blood pressure was elevated and he was having chills so she covered him with 2 blankets and called EMS.  Patient reportedly had a fever of 100.6 axillary.  Is on oxygen  at home at 3 L/min and apparently his oxygen  tank ran out of oxygen  while he was in triage with worsening dyspnea.  No chest pain or palpitations.  No dysuria, oliguria or hematuria or flank pain he denies any significantly worsening cough or wheezing.   ED Course: When he came to the ER, temperature was 100.6/38.1 axillary, respiratory 24 and BP 164/121.  He was in significant respiratory distress he was placed on BiPAP and on 40% FiO2 satting 98 to 100%.  Labs revealed a BUN of 76 and creatinine 3.49, the previous levels and albumin 3.3.  BNP was 820.9 and high sensitive troponin I 17.  Procalcitonin was 0.22 and lactic acid 1.  CBC showed hemoglobin of 11.7 hematocrit 36 close to previous levels.  Coag profile was within normal and respiratory panel was negative.  UA showed 0-5 WBCs and more than 300 protein. EKG as reviewed by me :EKG showed sinus rhythm with rate of 94 with Q waves inferiorly. Imaging: Portable chest x-ray showed no acute cardiopulmonary disease.   Patient was given IV Rocephin  and Zithromax , 4 mg of IV Zofran  and 1 p.o. Tylenol  1 g.  He will be admitted to a progressive unit bed for further evaluation and management.    [name] [name]  FMW:[id number] DOB: Nov 16, 1945 DOA: 04/21/2023 PCP: Orlean Alan HERO, FNP   Assessment & Plan:   Principal Problem:   Acute respiratory failure [facility]) Active Problems:   Type 2 diabetes mellitus  with chronic kidney disease, without long-term current use of insulin  (HCC)   Essential hypertension   Dyslipidemia   BPH (benign prostatic hyperplasia)   Gout   Anxiety and depression   GERD without esophagitis  Assessment and Plan:  Acute hypoxic respiratory failure: likely secondary to COPD and acute on chronic diastolic CHF exacerbation. Continue on IV rocephin , bronchodilators. Dr. Fernand was consulted as per pt's family request only. Lasix  held as Cr was trending up. Dr. Fernand believes majority of pt's respiratory symptoms are secondary to COPD.   COPD exacerbation: continue on bronchodilators, IV rocephin , encourage incentive spirometry. Continue on supplemental oxygen  and wean as tolerated  Bacteremia: blood cxs growing strep gallolyticus. Continue on IV rocephin  as per ID. Source unclear. Urine cx is pending. Echo ordered. May need TEE as per ID   AKI on CKDIV: Cr is labile. Avoid nephrotoxic meds. Nephro following and recs apprec    DM2: likely poorly controlled. Continue on SSI w/ accuchecks   Peripheral neuropathy: continue on gabapentin     HTN: continue on BB   HLD: continue on zetia     GERD: continue on PPI    Depression: severity unknown. Continue on citalopram , buspar    Gout: continue on febuxostat . Holding home dose of colchicine     BPH: continue on flomax    Thrombocytopenia: etiology unclear. Will continue to monitor   Obesity: BMI 35.2. Would benefit from weight loss    DVT prophylaxis: lovenox   Code Status: full  Family  Communication:  Disposition Plan: depends on PT/OT recs   Level of care: Progressive  Status is: Inpatient Remains inpatient appropriate because: severity of illness    Consultants:  Cardio  Nephro ID   Procedures:   Antimicrobials: rocephin   Subjective: Pt c/o frustration   Objective: Vitals:   04/24/23 0700 04/24/23 0730 04/24/23 0800 04/24/23 0842  BP: (!) 181/78 (!) 177/76 (!) 166/72   Pulse: 61 63 61   Resp: (!)  21 (!) 25 19   Temp:    97.9 F (36.6 C)  TempSrc:    Oral  SpO2: 99% 99% 100%   Weight:        Intake/Output Summary (Last 24 hours) at 04/24/2023 0858 Last data filed at 04/23/2023 1730 Gross per 24 hour  Intake --  Output 500 ml  Net -500 ml   Filed Weights   04/22/23 0132  Weight: 90.3 kg    Examination:  General exam: appears uncomfortable  Respiratory system: decreased breath sounds b/l  Cardiovascular system: S1 & S2+. No rubs or clicks  Gastrointestinal system: Abd is soft, NT, obese & normal bowel sounds  Central nervous system:alert & awake. Moves all extremities  Psychiatry: Judgement and insight appears poor. Frustrated mood and affect    Data Reviewed: I have personally reviewed following labs and imaging studies  CBC: Recent Labs  Lab 04/21/23 2352 04/22/23 0242 04/23/23 0457 04/24/23 0104  WBC 7.3 8.1 8.8 9.6  NEUTROABS 6.1  --   --   --   HGB 11.7* 9.8* 9.6* 9.1*  HCT 36.0* 30.8* 29.1* 27.0*  MCV 98.9 100.3* 96.7 94.4  PLT 158 137* 142* 148*   Basic Metabolic Panel: Recent Labs  Lab 04/21/23 2352 04/22/23 0242 04/23/23 0457 04/24/23 0104  NA 138 138 138 136  K 4.7 4.5 4.1 4.2  CL 99 102 101 102  CO2 26 22 23  21*  GLUCOSE 132* 175* 157* 141*  BUN 76* 79* 97* 97*  CREATININE 3.49* 3.41* 3.99* 3.85*  CALCIUM 8.5* 7.8* 8.2* 7.9*   GFR: Estimated Creatinine Clearance: 15.5 mL/min (A) (by C-G formula based on SCr of 3.85 mg/dL (H)). Liver Function Tests: Recent Labs  Lab 04/21/23 2352  AST 19  ALT 15  ALKPHOS 66  BILITOT 0.7  PROT 6.9  ALBUMIN 3.3*   No results for input(s): LIPASE, AMYLASE in the last 168 hours. No results for input(s): AMMONIA in the last 168 hours. Coagulation Profile: Recent Labs  Lab 04/21/23 2352  INR 1.1   Cardiac Enzymes: No results for input(s): CKTOTAL, CKMB, CKMBINDEX, TROPONINI in the last 168 hours. BNP (last 3 results) No results for input(s): PROBNP in the last 8760  hours. HbA1C: No results for input(s): HGBA1C in the last 72 hours. CBG: Recent Labs  Lab 04/23/23 0743 04/23/23 1141 04/23/23 1638 04/23/23 2221 04/24/23 0741  GLUCAP 124* 116* 158* 152* 112*   Lipid Profile: No results for input(s): CHOL, HDL, LDLCALC, TRIG, CHOLHDL, LDLDIRECT in the last 72 hours. Thyroid  Function Tests: No results for input(s): TSH, T4TOTAL, FREET4, T3FREE, THYROIDAB in the last 72 hours. Anemia Panel: No results for input(s): VITAMINB12, FOLATE, FERRITIN, TIBC, IRON, RETICCTPCT in the last 72 hours. Sepsis Labs: Recent Labs  Lab 04/21/23 2352 04/22/23 0242  PROCALCITON 0.22  --   LATICACIDVEN 1.0 2.3*    Recent Results (from the past 240 hours)  Blood Culture (routine x 2)     Status: Abnormal   Collection Time: 04/21/23 11:52 PM   Specimen: Left Antecubital;  Blood  Result Value Ref Range Status   Specimen Description   Final    LEFT ANTECUBITAL Performed at [facility], [address] [place], [place] [address]    Special Requests   Final    BOTTLES DRAWN AEROBIC AND ANAEROBIC Blood Culture results may not be optimal due to an inadequate volume of blood received in culture bottles Performed at [facility], [address] [place], [place] [address]    Culture  Setup Time   Final    GRAM POSITIVE COCCI IN BOTH AEROBIC AND ANAEROBIC BOTTLES CRITICAL RESULT CALLED TO, READ BACK BY AND VERIFIED WITH: MADISON HUNT @1053  04/22/23 MJU GRAM STAIN REVIEWED-AGREE WITH RESULT    Culture (A)  Final    STREPTOCOCCUS GALLOLYTICUS THE SIGNIFICANCE OF ISOLATING THIS ORGANISM FROM A SINGLE SET OF BLOOD CULTURES WHEN MULTIPLE SETS ARE DRAWN IS UNCERTAIN. PLEASE NOTIFY THE MICROBIOLOGY DEPARTMENT WITHIN ONE WEEK IF SPECIATION AND SENSITIVITIES ARE REQUIRED. Performed at [facility], [address], [place], [place] [address]    Report Status 04/24/2023 FINAL  Final  Blood Culture ID Panel  (Reflexed)     Status: Abnormal   Collection Time: 04/21/23 11:52 PM  Result Value Ref Range Status   Enterococcus faecalis NOT DETECTED NOT DETECTED Final   Enterococcus Faecium NOT DETECTED NOT DETECTED Final   Listeria monocytogenes NOT DETECTED NOT DETECTED Final   Staphylococcus species NOT DETECTED NOT DETECTED Final   Staphylococcus aureus (BCID) NOT DETECTED NOT DETECTED Final   Staphylococcus epidermidis NOT DETECTED NOT DETECTED Final   Staphylococcus lugdunensis NOT DETECTED NOT DETECTED Final   Streptococcus species DETECTED (A) NOT DETECTED Final    Comment: Not Enterococcus species, Streptococcus agalactiae, Streptococcus pyogenes, or Streptococcus pneumoniae. CRITICAL RESULT CALLED TO, READ BACK BY AND VERIFIED WITH: MADISON HUNT @1053  04/22/23 MJU    Streptococcus agalactiae NOT DETECTED NOT DETECTED Final   Streptococcus pneumoniae NOT DETECTED NOT DETECTED Final   Streptococcus pyogenes NOT DETECTED NOT DETECTED Final   A.calcoaceticus-baumannii NOT DETECTED NOT DETECTED Final   Bacteroides fragilis NOT DETECTED NOT DETECTED Final   Enterobacterales NOT DETECTED NOT DETECTED Final   Enterobacter cloacae complex NOT DETECTED NOT DETECTED Final   Escherichia coli NOT DETECTED NOT DETECTED Final   Klebsiella aerogenes NOT DETECTED NOT DETECTED Final   Klebsiella oxytoca NOT DETECTED NOT DETECTED Final   Klebsiella pneumoniae NOT DETECTED NOT DETECTED Final   Proteus species NOT DETECTED NOT DETECTED Final   Salmonella species NOT DETECTED NOT DETECTED Final   Serratia marcescens NOT DETECTED NOT DETECTED Final   Haemophilus influenzae NOT DETECTED NOT DETECTED Final   Neisseria meningitidis NOT DETECTED NOT DETECTED Final   Pseudomonas aeruginosa NOT DETECTED NOT DETECTED Final   Stenotrophomonas maltophilia NOT DETECTED NOT DETECTED Final   Candida albicans NOT DETECTED NOT DETECTED Final   Candida auris NOT DETECTED NOT DETECTED Final   Candida glabrata NOT  DETECTED NOT DETECTED Final   Candida krusei NOT DETECTED NOT DETECTED Final   Candida parapsilosis NOT DETECTED NOT DETECTED Final   Candida tropicalis NOT DETECTED NOT DETECTED Final   Cryptococcus neoformans/gattii NOT DETECTED NOT DETECTED Final    Comment: Performed at [facility], [address] [place], [place] [address]  Resp panel by RT-PCR (RSV, Flu A&B, Covid) Anterior Nasal Swab     Status: None   Collection Time: 04/21/23 11:56 PM   Specimen: Anterior Nasal Swab  Result Value Ref Range Status   SARS Coronavirus 2 by  RT PCR NEGATIVE NEGATIVE Final    Comment: (NOTE) SARS-CoV-2 target nucleic acids are NOT DETECTED.  The SARS-CoV-2 RNA is generally detectable in upper respiratory specimens during the acute phase of infection. The lowest concentration of SARS-CoV-2 viral copies this assay can detect is 138 copies/mL. A negative result does not preclude SARS-Cov-2 infection and should not be used as the sole basis for treatment or other patient management decisions. A negative result may occur with  improper specimen collection/handling, submission of specimen other than nasopharyngeal swab, presence of viral mutation(s) within the areas targeted by this assay, and inadequate number of viral copies(<138 copies/mL). A negative result must be combined with clinical observations, patient history, and epidemiological information. The expected result is Negative.  Fact Sheet for Patients:  bloggercourse.com  Fact Sheet for Healthcare Providers:  seriousbroker.it  This test is no t yet approved or cleared by the [place]  FDA and  has been authorized for detection and/or diagnosis of SARS-CoV-2 by FDA under an Emergency Use Authorization (EUA). This EUA will remain  in effect (meaning this test can be used) for the duration of the COVID-19 declaration under Section 564(b)(1) of the Act, 21 U.S.C.section  360bbb-3(b)(1), unless the authorization is terminated  or revoked sooner.       Influenza A by PCR NEGATIVE NEGATIVE Final   Influenza B by PCR NEGATIVE NEGATIVE Final    Comment: (NOTE) The Xpert Xpress SARS-CoV-2/FLU/RSV plus assay is intended as an aid in the diagnosis of influenza from Nasopharyngeal swab specimens and should not be used as a sole basis for treatment. Nasal washings and aspirates are unacceptable for Xpert Xpress SARS-CoV-2/FLU/RSV testing.  Fact Sheet for Patients: bloggercourse.com  Fact Sheet for Healthcare Providers: seriousbroker.it  This test is not yet approved or cleared by the [place]  FDA and has been authorized for detection and/or diagnosis of SARS-CoV-2 by FDA under an Emergency Use Authorization (EUA). This EUA will remain in effect (meaning this test can be used) for the duration of the COVID-19 declaration under Section 564(b)(1) of the Act, 21 U.S.C. section 360bbb-3(b)(1), unless the authorization is terminated or revoked.     Resp Syncytial Virus by PCR NEGATIVE NEGATIVE Final    Comment: (NOTE) Fact Sheet for Patients: bloggercourse.com  Fact Sheet for Healthcare Providers: seriousbroker.it  This test is not yet approved or cleared by the [place]  FDA and has been authorized for detection and/or diagnosis of SARS-CoV-2 by FDA under an Emergency Use Authorization (EUA). This EUA will remain in effect (meaning this test can be used) for the duration of the COVID-19 declaration under Section 564(b)(1) of the Act, 21 U.S.C. section 360bbb-3(b)(1), unless the authorization is terminated or revoked.  Performed at [facility], [address] [place], [place] [address]   Blood Culture (routine x 2)     Status: None (Preliminary result)   Collection Time: 04/22/23  9:49 PM   Specimen: BLOOD  Result Value Ref Range  Status   Specimen Description BLOOD BLOOD LEFT ARM  Final   Special Requests   Final    BOTTLES DRAWN AEROBIC AND ANAEROBIC Blood Culture results may not be optimal due to an inadequate volume of blood received in culture bottles   Culture   Final    NO GROWTH 2 DAYS Performed at [facility], [address], [place], [place] [address]    Report Status PENDING  Incomplete         Radiology Studies: No results found.  Scheduled Meds:  vitamin C   1,000 mg Oral QHS   aspirin  EC  81 mg Oral Daily   calcitRIOL   0.25 mcg Oral Daily   citalopram   40 mg Oral Daily   enoxaparin  (LOVENOX ) injection  30 mg Subcutaneous Q24H   ezetimibe   10 mg Oral QHS   febuxostat   40 mg Oral Daily   gabapentin   200 mg Oral Daily   gabapentin   400 mg Oral QHS   guaiFENesin   600 mg Oral BID   insulin  aspart  0-15 Units Subcutaneous TID WC   insulin  aspart  0-5 Units Subcutaneous QHS   ipratropium-albuterol   3 mL Nebulization QID   metoprolol  succinate  50 mg Oral Daily   multivitamin  1 tablet Oral BID   pantoprazole   40 mg Oral Daily   predniSONE   40 mg Oral Q breakfast   tamsulosin   0.4 mg Oral Daily   Continuous Infusions:  cefTRIAXone  (ROCEPHIN )  IV 2 g (04/24/23 0801)     LOS: 2 days      Anthony CHRISTELLA Pouch, MD [facility] Pager [phone]  If 7PM-7AM, please contact night-coverage [email or web address] 04/24/2023, 8:58 AM

## 2023-04-24 NOTE — Progress Notes (Signed)
*  PRELIMINARY RESULTS* Echocardiogram 2D Echocardiogram has been performed.  [name] [name] 04/24/2023, 12:37 PM

## 2023-04-24 NOTE — ED Notes (Signed)
 ED TO INPATIENT HANDOFF REPORT  ED Nurse Name and Phone #: Zael Shuman [phone]  S Name/Age/Gender [name] [name] 77 y.o. male Room/Bed: ED37A/ED37A  Code Status   Code Status: Full Code  Home/SNF/Other Home Patient oriented to: self, place, time, and situation Is this baseline? Yes   Triage Complete: Triage complete  Chief Complaint Acute respiratory failure (HCC) [J96.00] Bacteremia [R78.81]  Triage Note Pt reports SOB. Pt was seen by EMS at home for same and NRB placed. Pt refused transport at home. Pt arrives with NRB in place and reports continued SOB. Pt is dsypneic and with shallow respirations speaking in very short sentences. Triage bypassed and pt taken immediately to room. Charge RN And MD notified. Staff met pt in room.    Allergies Allergies  Allergen Reactions   Nsaids Other (See Comments)    Avoid due to kidney disease    Statins Other (See Comments)    Myalgias   Nexletol [Bempedoic Acid] Diarrhea    fatigue   Silver Other (See Comments)   Tegaderm High Gelling Alginate [Wound Dressings] Rash    Level of Care/Admitting Diagnosis ED Disposition     ED Disposition  Admit   Condition  --   Comment  Hospital Area: [facility] [[id number]]  Level of Care: Telemetry Medical [104]  Covid Evaluation: Asymptomatic - no recent exposure (last 10 days) testing not required  Diagnosis: Bacteremia [790.7.ICD-9-CM]  Admitting Physician: TRUDY ANTHONY HERO [[id number]]  Attending Physician: TRUDY ANTHONY HERO [[id number]]  Certification:: I certify this patient will need inpatient services for at least 2 midnights          B Medical/Surgery History Past Medical History:  Diagnosis Date   Accidental cut, puncture, perforation, or hemorrhage during heart catheterization 12/27/2012   Acute postoperative pain 08/02/2016   Acute renal failure (ARF) (HCC)    AKI (acute kidney injury) (HCC) 07/27/2020   ANA positive 07/12/2015   Aortic  stenosis, 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 post. helix. 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   CAD (coronary artery disease)    CAP (community acquired pneumonia) 07/26/2020   CKD (chronic kidney disease), stage IV (HCC)    Complete tear of right rotator cuff 11/15/2015   Complex tear of lateral meniscus of left knee as current injury 12/06/2020   Complex tear of medial meniscus of left knee as current injury 10/18/2020   COPD (chronic obstructive pulmonary disease) (HCC)    DM (diabetes mellitus), type 2 (HCC) 07/30/2020   no meds   DOE (dyspnea on exertion)    GERD (gastroesophageal reflux disease)    Gout    Grade I diastolic dysfunction    Heart murmur    HFrEF (heart failure with reduced ejection fraction) (HCC)    History of hiatal hernia    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  Performed by Dr. Ricky on 08/01/16:  29 mm commercially available Medtronic Core Valve transcatheter aortic valve procedure placed via transfemoral approach by Dr. Ricky and co-surgeon Dr. Godfrey.   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 MOHS.   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/07/2021   Right zygoma, [facility] 05/02/21   Supplemental oxygen  dependent    3-4 L/[place]   Supplemental oxygen  dependent    T2DM (type 2 diabetes mellitus) (HCC)    no meds   Past Surgical History:  Procedure Laterality Date   AORTIC VALVE REPLACEMENT N/A 08/01/2016   29 mm CoreValve Evolut; Location: [facility]; Surgeon: Reyes Ricky, MD   CARDIAC CATHETERIZATION N/A 12/26/2012   2v CAD; retained pigtail in myocardium --> transferred to [facility]; Location: [facility]; Surgeon: Vita Bathe, MD   CATARACT EXTRACTION W/PHACO Left 10/03/2022   Procedure: CATARACT EXTRACTION PHACO AND INTRAOCULAR LENS PLACEMENT (IOC) LEFT DIABETIC 8.22 00:55.5;  Surgeon: Jaye Fallow, MD;  Location: [facility];  Service: Ophthalmology;  Laterality: Left;  sleep apnea   CATARACT EXTRACTION W/PHACO Right 10/17/2022   Procedure: CATARACT EXTRACTION PHACO AND INTRAOCULAR LENS PLACEMENT (IOC) RIGHT DIABETIC;  Surgeon: Jaye Fallow, MD;  Location: [facility];  Service: Ophthalmology;  Laterality: Right;  4.79 0:37.4   COLONOSCOPY     KNEE ARTHROSCOPY WITH MEDIAL MENISECTOMY Left 12/02/2020   Procedure: KNEE ARTHROSCOPY WITH DEBRIDEMENT AND PARTIAL MEDIAL AND LATERAL MENISECTOMY;  Surgeon: Edie Norleen PARAS, MD;  Location: [facility] OR[facility];  Service: Orthopedics;  Laterality: Left;   LITHOTRIPSY     PERCUTANEOUS REMOVAL INTRA-AORTIC BALLOON CATH N/A 12/26/2012   Procedure: PERCUTANEOUS REMOVAL INTRA-AORTIC BALLOON CATH; Surgeon: Lang Lannie Salter, MD; Location: [facility]; Service: Cardiothoracic   RIGHT HEART CATH Right 07/13/2016   Location: [facility]; Surgeon: Ozell Estelle, MD   TRANSESOPHAGEAL ECHOCARDIOGRAM N/A 12/26/2012   Procedure: TRANSESOPHAGEAL ECHOCARDIOGRAPHY; Surgeon: Lang Lannie Salter, MD; Location: [facility]; Service: Cardiothoracic   UMBILICAL HERNIA REPAIR N/A 02/13/2014   Procedure: LAPAROSCOPIC  UMBILICAL HERNIA REPAIR; Surgeon: Shanda ONEIDA Salter, MD; Location: [facility]; Service: General Surgery     A IV Location/Drains/Wounds Patient Lines/Drains/Airways Status     Active Line/Drains/Airways     Name Placement date Placement time Site Days   Peripheral IV 04/21/23 20 G Left Antecubital 04/21/23  2354  Antecubital  3   Peripheral IV 04/21/23 20 G Right Antecubital 04/21/23  2354  Antecubital  3            Intake/Output Last 24 hours  Intake/Output Summary (Last 24 hours) at 04/24/2023 1952 Last data filed at 04/24/2023 9083 Gross per 24 hour  Intake 100 ml  Output --  Net 100 ml    Labs/Imaging Results for orders placed or performed during the hospital encounter of 04/21/23 (from the past 48 hours)  CBG monitoring, ED     Status: Abnormal   Collection Time: 04/22/23  9:28 PM  Result Value Ref Range   Glucose-Capillary 171 (H) 70 - 99 mg/dL    Comment: Glucose reference range applies only to samples taken after fasting for at least 8 hours.  Blood Culture (routine x 2)  Status: None (Preliminary result)   Collection Time: 04/22/23  9:49 PM   Specimen: BLOOD  Result Value Ref Range   Specimen Description BLOOD BLOOD LEFT ARM    Special Requests      BOTTLES DRAWN AEROBIC AND ANAEROBIC Blood Culture results may not be optimal due to an inadequate volume of blood received in culture bottles   Culture      NO GROWTH 2 DAYS Performed at [facility], [address] [place], [place] [address]    Report Status PENDING   CBC     Status: Abnormal   Collection Time: 04/23/23  4:57 AM  Result Value Ref Range   WBC 8.8 4.0 - 10.5 K/uL   RBC 3.01 (L) 4.22 - 5.81 MIL/uL   Hemoglobin 9.6 (L) 13.0 - 17.0 g/dL   HCT 70.8 (L) 60.9 - 47.9 %   MCV 96.7 80.0 - 100.0 fL   MCH 31.9 26.0 - 34.0 pg   MCHC 33.0 30.0 - 36.0 g/dL   RDW 86.7 88.4 - 84.4 %   Platelets 142 (L) 150 - 400 K/uL   nRBC 0.0 0.0 - 0.2 %    Comment: Performed at [facility], [address], [place], [place] [address]  Basic metabolic panel     Status: Abnormal   Collection Time: 04/23/23  4:57 AM  Result Value Ref Range   Sodium 138 135 - 145 mmol/L   Potassium 4.1 3.5 - 5.1 mmol/L   Chloride 101 98 - 111 mmol/L   CO2 23 22 - 32 mmol/L   Glucose, Bld 157 (H) 70 - 99 mg/dL    Comment: Glucose reference range applies only to samples taken after fasting for at least 8 hours.   BUN 97 (H) 8 - 23 mg/dL   Creatinine, Ser 6.00 (H) 0.61 - 1.24 mg/dL   Calcium 8.2 (L) 8.9 - 10.3 mg/dL   GFR, Estimated 15 (L) >60 mL/min    Comment: (NOTE) Calculated using the CKD-EPI Creatinine Equation (2021)    Anion gap 14 5 - 15    Comment: Performed at [facility], [address] [place], [place] [address]  CBG monitoring, ED     Status: Abnormal   Collection Time: 04/23/23  7:43 AM  Result Value Ref Range   Glucose-Capillary 124 (H) 70 - 99 mg/dL    Comment: Glucose reference range applies only to samples taken after fasting for at least 8 hours.  CBG monitoring, ED     Status: Abnormal   Collection Time: 04/23/23 11:41 AM  Result Value Ref Range   Glucose-Capillary 116 (H) 70 - 99 mg/dL    Comment: Glucose reference range applies only to samples taken after fasting for at least 8 hours.  CBG monitoring, ED     Status: Abnormal   Collection Time: 04/23/23  4:38 PM  Result Value Ref Range   Glucose-Capillary 158 (H) 70 - 99 mg/dL    Comment: Glucose reference range applies only to samples taken after fasting for at least 8 hours.  CBG monitoring, ED     Status: Abnormal   Collection Time: 04/23/23 10:21 PM  Result Value Ref Range   Glucose-Capillary 152 (H) 70 - 99 mg/dL    Comment: Glucose reference range applies only to samples taken after fasting for at least 8 hours.  CBC     Status: Abnormal   Collection Time: 04/24/23  1:04 AM  Result Value Ref Range   WBC 9.6 4.0 -  10.5 K/uL   RBC 2.86 (L) 4.22 - 5.81 MIL/uL   Hemoglobin 9.1 (L) 13.0 - 17.0  g/dL   HCT 72.9 (L) 60.9 - 47.9 %   MCV 94.4 80.0 - 100.0 fL   MCH 31.8 26.0 - 34.0 pg   MCHC 33.7 30.0 - 36.0 g/dL   RDW 86.6 88.4 - 84.4 %   Platelets 148 (L) 150 - 400 K/uL   nRBC 0.0 0.0 - 0.2 %    Comment: Performed at [facility], [address], [place], [place] [address]  Basic metabolic panel     Status: Abnormal   Collection Time: 04/24/23  1:04 AM  Result Value Ref Range   Sodium 136 135 - 145 mmol/L   Potassium 4.2 3.5 - 5.1 mmol/L   Chloride 102 98 - 111 mmol/L   CO2 21 (L) 22 - 32 mmol/L   Glucose, Bld 141 (H) 70 - 99 mg/dL    Comment: Glucose reference range applies only to samples taken after fasting for at least 8 hours.   BUN 97 (H) 8 - 23 mg/dL   Creatinine, Ser 6.14 (H) 0.61 - 1.24 mg/dL   Calcium 7.9 (L) 8.9 - 10.3 mg/dL   GFR, Estimated 15 (L) >60 mL/min    Comment: (NOTE) Calculated using the CKD-EPI Creatinine Equation (2021)    Anion gap 13 5 - 15    Comment: Performed at [facility], [address] [place], [place] [address]  CBG monitoring, ED     Status: Abnormal   Collection Time: 04/24/23  7:41 AM  Result Value Ref Range   Glucose-Capillary 112 (H) 70 - 99 mg/dL    Comment: Glucose reference range applies only to samples taken after fasting for at least 8 hours.  CBG monitoring, ED     Status: Abnormal   Collection Time: 04/24/23 11:14 AM  Result Value Ref Range   Glucose-Capillary 124 (H) 70 - 99 mg/dL    Comment: Glucose reference range applies only to samples taken after fasting for at least 8 hours.  CBG monitoring, ED     Status: Abnormal   Collection Time: 04/24/23  4:32 PM  Result Value Ref Range   Glucose-Capillary 175 (H) 70 - 99 mg/dL    Comment: Glucose reference range applies only to samples taken after fasting for at least 8 hours.   ECHOCARDIOGRAM COMPLETE Result Date: 04/24/2023    ECHOCARDIOGRAM REPORT   Patient Name:   [name] [name] Date of Exam: 04/24/2023 Medical Rec #:  [id number]                Height:       63.0 in Accession #:    [id number]              Weight:       199.1 lb Date of Birth:  [birth year]              BSA:          1.930 m Patient Age:    77 years                BP:           166/72 mmHg Patient Gender: M                       HR:           70 bpm. Exam Location:  [facility] Procedure: 2D Echo, Cardiac Doppler and  Color Doppler Indications:     Bacteremia  History:         Patient has prior history of Echocardiogram examinations, most                  recent 07/28/2020. Signs/Symptoms:Dyspnea; Risk                  Factors:Hypertension and Diabetes. 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 Valves replaced at [facility] in 2017 per                  patient.  Sonographer:     Naomie Reef Referring Phys:  [id number] DONALD BERLIN Diagnosing Phys: Redell Cave MD IMPRESSIONS  1. Left ventricular ejection fraction, by estimation, is 50%. The left ventricle has low normal function. The left ventricle has no regional wall motion abnormalities. There is mild left ventricular hypertrophy. Left ventricular diastolic parameters are  indeterminate.  2. Right ventricular systolic function is low normal. The right ventricular size is normal. There is mildly elevated pulmonary artery systolic pressure.  3. Left atrial size was severely dilated.  4. The mitral valve is degenerative. Mild to moderate mitral valve regurgitation. Moderate mitral stenosis. The mean mitral valve gradient is 7.5 mmHg. Moderate mitral annular calcification.  5. The aortic valve has been repaired/replaced. Aortic valve regurgitation is not visualized. Aortic valve mean gradient measures 12.0 mmHg.  6. Aortic dilatation noted. There is mild dilatation of the aortic root, measuring 41 mm. FINDINGS  Left Ventricle: Left ventricular ejection fraction, by estimation, is 50%. The left ventricle has low normal function. The left  ventricle has no regional wall motion abnormalities. The left ventricular internal cavity size was normal in size. There is mild left ventricular hypertrophy. Left ventricular diastolic parameters are indeterminate. Right Ventricle: The right ventricular size is normal. No increase in right ventricular wall thickness. Right ventricular systolic function is low normal. There is mildly elevated pulmonary artery systolic pressure. The tricuspid regurgitant velocity is 2.79 m/s, and with an assumed right atrial pressure of 8 mmHg, the estimated right ventricular systolic pressure is 39.1 mmHg. Left Atrium: Left atrial size was severely dilated. Right Atrium: Right atrial size was normal in size. Pericardium: There is no evidence of pericardial effusion. Mitral Valve: The mitral valve is degenerative in appearance. Moderate mitral annular calcification. Mild to moderate mitral valve regurgitation. Moderate mitral valve stenosis. MV peak gradient, 18.1 mmHg. The mean mitral valve gradient is 7.5 mmHg. Tricuspid Valve: The tricuspid valve is normal in structure. Tricuspid valve regurgitation is mild. Aortic Valve: The aortic valve has been repaired/replaced. Aortic valve regurgitation is not visualized. Aortic valve mean gradient measures 12.0 mmHg. Aortic valve peak gradient measures 21.1 mmHg. Aortic valve area, by VTI measures 1.76 cm. Pulmonic Valve: The pulmonic valve was not well visualized. Pulmonic valve regurgitation is not visualized. Aorta: Aortic dilatation noted. There is mild dilatation of the aortic root, measuring 41 mm. Venous: The inferior vena cava was not well visualized. IAS/Shunts: No atrial level shunt detected by color flow Doppler.  LEFT VENTRICLE PLAX 2D LVIDd:         5.50 cm      Diastology LVIDs:         4.00 cm  LV e' lateral:   6.74 cm/s LV PW:         1.30 cm      LV E/e' lateral: 24.6 LV IVS:        1.40 cm LVOT diam:     2.00 cm LV SV:         102 LV SV Index:   53 LVOT Area:     3.14  cm  LV Volumes (MOD) LV vol d, MOD A2C: 113.0 ml LV vol d, MOD A4C: 91.9 ml LV vol s, MOD A2C: 59.1 ml LV vol s, MOD A4C: 51.3 ml LV SV MOD A2C:     53.9 ml LV SV MOD A4C:     91.9 ml LV SV MOD BP:      44.7 ml RIGHT VENTRICLE RV Basal diam:  3.60 cm RV Mid diam:    2.90 cm RV S prime:     12.70 cm/s TAPSE (M-mode): 2.1 cm LEFT ATRIUM              Index        RIGHT ATRIUM           Index LA diam:        4.90 cm  2.54 cm/m   RA Area:     17.10 cm LA Vol (A2C):   137.0 ml 70.99 ml/m  RA Volume:   46.10 ml  23.89 ml/m LA Vol (A4C):   122.0 ml 63.22 ml/m LA Biplane Vol: 134.0 ml 69.44 ml/m  AORTIC VALVE                     PULMONIC VALVE AV Area (Vmax):    1.83 cm      PV Vmax:       1.07 m/s AV Area (Vmean):   1.71 cm      PV Peak grad:  4.6 mmHg AV Area (VTI):     1.76 cm AV Vmax:           229.50 cm/s AV Vmean:          161.000 cm/s AV VTI:            0.579 m AV Peak Grad:      21.1 mmHg AV Mean Grad:      12.0 mmHg LVOT Vmax:         134.00 cm/s LVOT Vmean:        87.600 cm/s LVOT VTI:          0.325 m LVOT/AV VTI ratio: 0.56  AORTA Ao Root diam: 4.10 cm MITRAL VALVE                  TRICUSPID VALVE MV Area (PHT): 4.60 cm       TR Peak grad:   31.1 mmHg MV Area VTI:   1.86 cm       TR Vmax:        279.00 cm/s MV Peak grad:  18.1 mmHg MV Mean grad:  7.5 mmHg       SHUNTS MV Vmax:       2.12 m/s       Systemic VTI:  0.32 m MV Vmean:      129.0 cm/s     Systemic Diam: 2.00 cm MV Decel Time: 165 msec MR Peak grad:    136.0 mmHg MR Mean grad:    102.0 mmHg MR Vmax:         583.00 cm/s MR Vmean:  485.0 cm/s MR PISA:         0.57 cm MR PISA Eff ROA: 9 mm MR PISA Radius:  0.30 cm MV E velocity: 166.00 cm/s MV A velocity: 178.00 cm/s MV E/A ratio:  0.93 Redell Cave MD Electronically signed by Redell Cave MD Signature Date/Time: 04/24/2023/2:16:38 PM    Final     Pending Labs Unresulted Labs (From admission, onward)     Start     Ordered   04/29/23 0500  Creatinine, serum  (enoxaparin   (LOVENOX )    CrCl >/= 30 ml/min)  Weekly,   TIMED     Comments: while on enoxaparin  therapy    04/22/23 0138   04/25/23 0500  CBC  Tomorrow morning,   R        04/24/23 1551   04/25/23 0500  Basic metabolic panel  Tomorrow morning,   R        04/24/23 1551   04/24/23 0923  Culture, blood (Routine X 2) w Reflex to ID Panel  BLOOD CULTURE X 2,   TIMED      04/24/23 0923   04/23/23 1520  Urine Culture (for pregnant, neutropenic or urologic patients or patients with an indwelling urinary catheter)  (Urine Labs)  Add-on,   AD       Question:  Indication  Answer:  Sepsis   04/23/23 1519            Vitals/Pain Today's Vitals   04/24/23 1845 04/24/23 1900 04/24/23 1902 04/24/23 1903  BP:    138/74  Pulse: 74 78    Resp: (!) 22     Temp:   97.8 F (36.6 C)   TempSrc:   Oral   SpO2: 96% 96%    Weight:      PainSc:        Isolation Precautions No active isolations  Medications Medications  enoxaparin  (LOVENOX ) injection 30 mg (30 mg Subcutaneous Given 04/24/23 0923)  acetaminophen  (TYLENOL ) tablet 650 mg (650 mg Oral Given 04/22/23 0352)    Or  acetaminophen  (TYLENOL ) suppository 650 mg ( Rectal See Alternative 04/22/23 0352)  magnesium  hydroxide (MILK OF MAGNESIA) suspension 30 mL (has no administration in time range)  traZODone  (DESYREL ) tablet 25 mg (25 mg Oral Given 04/23/23 2210)  ondansetron  (ZOFRAN ) tablet 4 mg (has no administration in time range)    Or  ondansetron  (ZOFRAN ) injection 4 mg (has no administration in time range)  methylPREDNISolone  sodium succinate (SOLU-MEDROL ) 40 mg/mL injection 40 mg (40 mg Intravenous Given 04/22/23 2129)    Followed by  predniSONE  (DELTASONE ) tablet 40 mg (40 mg Oral Given 04/24/23 0755)  ipratropium-albuterol  (DUONEB) 0.5-2.5 (3) MG/3ML nebulizer solution 3 mL (3 mLs Nebulization Given 04/24/23 1901)  guaiFENesin  (MUCINEX ) 12 hr tablet 600 mg (600 mg Oral Given 04/24/23 0930)  chlorpheniramine-HYDROcodone  (TUSSIONEX) 10-8 MG/5ML  suspension 5 mL (has no administration in time range)  aspirin  EC tablet 81 mg (81 mg Oral Given 04/24/23 0924)  febuxostat  (ULORIC ) tablet 40 mg (40 mg Oral Given 04/24/23 0931)  ezetimibe  (ZETIA ) tablet 10 mg (10 mg Oral Given 04/23/23 2210)  metoprolol  succinate (TOPROL -XL) 24 hr tablet 50 mg (50 mg Oral Given 04/24/23 0925)  busPIRone  (BUSPAR ) tablet 5 mg (5 mg Oral Given 04/22/23 2133)  calcitRIOL  (ROCALTROL ) capsule 0.25 mcg (0.25 mcg Oral Given 04/24/23 0931)  citalopram  (CELEXA ) tablet 40 mg (40 mg Oral Given 04/24/23 1014)  pantoprazole  (PROTONIX ) EC tablet 40 mg (40 mg Oral Given 04/24/23 0924)  tamsulosin  (FLOMAX ) capsule 0.4  mg (0.4 mg Oral Given 04/24/23 1014)  gabapentin  (NEURONTIN ) capsule 400 mg (400 mg Oral Given 04/23/23 2210)  gabapentin  (NEURONTIN ) capsule 200 mg (200 mg Oral Given 04/24/23 0925)  ascorbic acid  (VITAMIN C ) tablet 1,000 mg (1,000 mg Oral Given 04/23/23 2211)  multivitamin (PROSIGHT) tablet 1 tablet (1 tablet Oral Given 04/24/23 0931)  loratadine  (CLARITIN ) tablet 10 mg (has no administration in time range)  ipratropium-albuterol  (DUONEB) 0.5-2.5 (3) MG/3ML nebulizer solution 3 mL (has no administration in time range)  insulin  aspart (novoLOG ) injection 0-15 Units (3 Units Subcutaneous Given 04/24/23 1633)  insulin  aspart (novoLOG ) injection 0-5 Units (3 Units Subcutaneous Given 04/23/23 2244)  cefTRIAXone  (ROCEPHIN ) 2 g in sodium chloride  0.9 % 100 mL IVPB (0 g Intravenous Stopped 04/24/23 0916)  docusate sodium  (COLACE) capsule 200 mg (200 mg Oral Given 04/24/23 0924)  acetaminophen  (TYLENOL ) tablet 1,000 mg (1,000 mg Oral Given 04/22/23 0030)  ondansetron  (ZOFRAN ) injection 4 mg (4 mg Intravenous Given 04/22/23 0049)  methylPREDNISolone  sodium succinate (SOLU-MEDROL ) 125 mg/2 mL injection 125 mg (125 mg Intravenous Given 04/22/23 0204)  ipratropium-albuterol  (DUONEB) 0.5-2.5 (3) MG/3ML nebulizer solution 3 mL (3 mLs Nebulization Given 04/22/23 0220)   cefTRIAXone  (ROCEPHIN ) 2 g in sodium chloride  0.9 % 100 mL IVPB (0 g Intravenous Stopped 04/22/23 0236)  azithromycin  (ZITHROMAX ) 500 mg in sodium chloride  0.9 % 250 mL IVPB (0 mg Intravenous Stopped 04/22/23 0321)  metoCLOPramide  (REGLAN ) injection 10 mg (10 mg Intravenous Given 04/22/23 0217)  furosemide  (LASIX ) injection 20 mg (20 mg Intravenous Given 04/22/23 0428)    Mobility walks with device     Focused Assessments Pulmonary Assessment Handoff:  Lung sounds: Bilateral Breath Sounds: Diminished L Breath Sounds: Diminished R Breath Sounds: Diminished O2 Device: Nasal Cannula O2 Flow Rate (L/min): 3 L/min    R Recommendations: See Admitting Provider Note  Report given to:   Additional Notes: .

## 2023-04-24 NOTE — Progress Notes (Signed)
 Date of Admission:  04/21/2023     ID: [name] [name] is a 77 y.o. male  Principal Problem:   Acute respiratory failure (HCC) Active Problems:   Essential hypertension   BPH (benign prostatic hyperplasia)   Gout   Dyslipidemia   Anxiety and depression   GERD without esophagitis   Type 2 diabetes mellitus with chronic kidney disease, without long-term current use of insulin  (HCC)   Bacteremia  [name] [name] is a 77 y.o. with a history of CKD, gout, DM, CAD, HFrEF, hypothyroidism, copd, HTN, CVA, TAVR Presented to the ED on 04/21/23 with acute shortness of breath. He was having chills and had to use 2 blankets. EMS documented temp of 100.6.   Subjective: Feeling better  Medications:   vitamin C   1,000 mg Oral QHS   aspirin  EC  81 mg Oral Daily   calcitRIOL   0.25 mcg Oral Daily   citalopram   40 mg Oral Daily   docusate sodium   200 mg Oral BID   enoxaparin  (LOVENOX ) injection  30 mg Subcutaneous Q24H   ezetimibe   10 mg Oral QHS   febuxostat   40 mg Oral Daily   gabapentin   200 mg Oral Daily   gabapentin   400 mg Oral QHS   guaiFENesin   600 mg Oral BID   insulin  aspart  0-15 Units Subcutaneous TID WC   insulin  aspart  0-5 Units Subcutaneous QHS   ipratropium-albuterol   3 mL Nebulization QID   metoprolol  succinate  50 mg Oral Daily   multivitamin  1 tablet Oral BID   pantoprazole   40 mg Oral Daily   predniSONE   40 mg Oral Q breakfast   tamsulosin   0.4 mg Oral Daily    Objective: Vital signs in last 24 hours: Patient Vitals for the past 24 hrs:  BP Temp Temp src Pulse Resp SpO2  04/24/23 2000 -- -- -- 76 (!) 21 97 %  04/24/23 1945 -- -- -- 73 (!) 22 96 %  04/24/23 1903 138/74 -- -- -- -- --  04/24/23 1902 -- 97.8 F (36.6 C) Oral -- -- --  04/24/23 1900 -- -- -- 78 -- 96 %  04/24/23 1845 -- -- -- 74 (!) 22 96 %  04/24/23 1715 (!) 145/71 -- -- 67 18 100 %  04/24/23 1430 (!) 148/72 -- -- 81 18 91 %  04/24/23 1230 (!) 154/78 -- -- 63 18 95 %   04/24/23 1105 (!) 163/67 -- -- 80 20 97 %  04/24/23 0842 -- 97.9 F (36.6 C) Oral -- -- --  04/24/23 0800 (!) 166/72 -- -- 61 19 100 %  04/24/23 0730 (!) 177/76 -- -- 63 (!) 25 99 %  04/24/23 0700 (!) 181/78 -- -- 61 (!) 21 99 %  04/24/23 0630 (!) 167/95 -- -- 70 20 99 %  04/24/23 0500 (!) 161/65 98 F (36.7 C) -- 61 (!) 22 94 %  04/24/23 0200 (!) 125/97 97.9 F (36.6 C) -- 68 (!) 22 94 %  04/24/23 0000 (!) 169/64 97.9 F (36.6 C) -- 75 (!) 25 96 %  04/23/23 2329 (!) 163/67 -- -- 81 (!) 21 95 %  04/23/23 2238 -- -- -- 72 (!) 23 94 %     PHYSICAL EXAM:  General: Alert, cooperative, no distress, appears stated age.  .Lungs: b/l air entry few rhonchi Heart: s1s2. Abdomen: Soft, non-tender,not distended. Bowel sounds normal. No masses Extremities: atraumatic, no cyanosis. No edema. No clubbing Skin: No rashes or lesions. Or  bruising Lymph: Cervical, supraclavicular normal. Neurologic: Grossly non-focal  Lab Results    Latest Ref Rng & Units 04/24/2023    1:04 AM 04/23/2023    4:57 AM 04/22/2023    2:42 AM  CBC  WBC 4.0 - 10.5 K/uL 9.6  8.8  8.1   Hemoglobin 13.0 - 17.0 g/dL 9.1  9.6  9.8   Hematocrit 39.0 - 52.0 % 27.0  29.1  30.8   Platelets 150 - 400 K/uL 148  142  137        Latest Ref Rng & Units 04/24/2023    1:04 AM 04/23/2023    4:57 AM 04/22/2023    2:42 AM  CMP  Glucose 70 - 99 mg/dL 858  842  824   BUN 8 - 23 mg/dL 97  97  79   Creatinine 0.61 - 1.24 mg/dL 6.14  6.00  6.58   Sodium 135 - 145 mmol/L 136  138  138   Potassium 3.5 - 5.1 mmol/L 4.2  4.1  4.5   Chloride 98 - 111 mmol/L 102  101  102   CO2 22 - 32 mmol/L 21  23  22    Calcium 8.9 - 10.3 mg/dL 7.9  8.2  7.8       Microbiology:  Studies/Results: ECHOCARDIOGRAM COMPLETE Result Date: 04/24/2023    ECHOCARDIOGRAM REPORT   Patient Name:   [name] [name] Date of Exam: 04/24/2023 Medical Rec #:  [id number]               Height:       63.0 in Accession #:    [id number]              Weight:        199.1 lb Date of Birth:  Sep 30, 1945              BSA:          1.930 m Patient Age:    77 years                BP:           166/72 mmHg Patient Gender: M                       HR:           70 bpm. Exam Location:  [facility] Procedure: 2D Echo, Cardiac Doppler and Color Doppler Indications:     Bacteremia  History:         Patient has prior history of Echocardiogram examinations, most                  recent 07/28/2020. Signs/Symptoms:Dyspnea; Risk                  Factors:Hypertension and Diabetes. 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 Valves replaced at [facility] in 2017 per                  patient.  Sonographer:     Naomie Reef Referring Phys:  [id number] DONALD BERLIN Diagnosing Phys: Redell Cave MD IMPRESSIONS  1. Left ventricular ejection fraction, by estimation, is 50%. The left ventricle has low normal function. The left ventricle has  no regional wall motion abnormalities. There is mild left ventricular hypertrophy. Left ventricular diastolic parameters are  indeterminate.  2. Right ventricular systolic function is low normal. The right ventricular size is normal. There is mildly elevated pulmonary artery systolic pressure.  3. Left atrial size was severely dilated.  4. The mitral valve is degenerative. Mild to moderate mitral valve regurgitation. Moderate mitral stenosis. The mean mitral valve gradient is 7.5 mmHg. Moderate mitral annular calcification.  5. The aortic valve has been repaired/replaced. Aortic valve regurgitation is not visualized. Aortic valve mean gradient measures 12.0 mmHg.  6. Aortic dilatation noted. There is mild dilatation of the aortic root, measuring 41 mm. FINDINGS  Left Ventricle: Left ventricular ejection fraction, by estimation, is 50%. The left ventricle has low normal function. The left ventricle has no regional wall motion abnormalities. The left ventricular  internal cavity size was normal in size. There is mild left ventricular hypertrophy. Left ventricular diastolic parameters are indeterminate. Right Ventricle: The right ventricular size is normal. No increase in right ventricular wall thickness. Right ventricular systolic function is low normal. There is mildly elevated pulmonary artery systolic pressure. The tricuspid regurgitant velocity is 2.79 m/s, and with an assumed right atrial pressure of 8 mmHg, the estimated right ventricular systolic pressure is 39.1 mmHg. Left Atrium: Left atrial size was severely dilated. Right Atrium: Right atrial size was normal in size. Pericardium: There is no evidence of pericardial effusion. Mitral Valve: The mitral valve is degenerative in appearance. Moderate mitral annular calcification. Mild to moderate mitral valve regurgitation. Moderate mitral valve stenosis. MV peak gradient, 18.1 mmHg. The mean mitral valve gradient is 7.5 mmHg. Tricuspid Valve: The tricuspid valve is normal in structure. Tricuspid valve regurgitation is mild. Aortic Valve: The aortic valve has been repaired/replaced. Aortic valve regurgitation is not visualized. Aortic valve mean gradient measures 12.0 mmHg. Aortic valve peak gradient measures 21.1 mmHg. Aortic valve area, by VTI measures 1.76 cm. Pulmonic Valve: The pulmonic valve was not well visualized. Pulmonic valve regurgitation is not visualized. Aorta: Aortic dilatation noted. There is mild dilatation of the aortic root, measuring 41 mm. Venous: The inferior vena cava was not well visualized. IAS/Shunts: No atrial level shunt detected by color flow Doppler.  LEFT VENTRICLE PLAX 2D LVIDd:         5.50 cm      Diastology LVIDs:         4.00 cm      LV e' lateral:   6.74 cm/s LV PW:         1.30 cm      LV E/e' lateral: 24.6 LV IVS:        1.40 cm LVOT diam:     2.00 cm LV SV:         102 LV SV Index:   53 LVOT Area:     3.14 cm  LV Volumes (MOD) LV vol d, MOD A2C: 113.0 ml LV vol d, MOD A4C: 91.9  ml LV vol s, MOD A2C: 59.1 ml LV vol s, MOD A4C: 51.3 ml LV SV MOD A2C:     53.9 ml LV SV MOD A4C:     91.9 ml LV SV MOD BP:      44.7 ml RIGHT VENTRICLE RV Basal diam:  3.60 cm RV Mid diam:    2.90 cm RV S prime:     12.70 cm/s TAPSE (M-mode): 2.1 cm LEFT ATRIUM              Index  RIGHT ATRIUM           Index LA diam:        4.90 cm  2.54 cm/m   RA Area:     17.10 cm LA Vol (A2C):   137.0 ml 70.99 ml/m  RA Volume:   46.10 ml  23.89 ml/m LA Vol (A4C):   122.0 ml 63.22 ml/m LA Biplane Vol: 134.0 ml 69.44 ml/m  AORTIC VALVE                     PULMONIC VALVE AV Area (Vmax):    1.83 cm      PV Vmax:       1.07 m/s AV Area (Vmean):   1.71 cm      PV Peak grad:  4.6 mmHg AV Area (VTI):     1.76 cm AV Vmax:           229.50 cm/s AV Vmean:          161.000 cm/s AV VTI:            0.579 m AV Peak Grad:      21.1 mmHg AV Mean Grad:      12.0 mmHg LVOT Vmax:         134.00 cm/s LVOT Vmean:        87.600 cm/s LVOT VTI:          0.325 m LVOT/AV VTI ratio: 0.56  AORTA Ao Root diam: 4.10 cm MITRAL VALVE                  TRICUSPID VALVE MV Area (PHT): 4.60 cm       TR Peak grad:   31.1 mmHg MV Area VTI:   1.86 cm       TR Vmax:        279.00 cm/s MV Peak grad:  18.1 mmHg MV Mean grad:  7.5 mmHg       SHUNTS MV Vmax:       2.12 m/s       Systemic VTI:  0.32 m MV Vmean:      129.0 cm/s     Systemic Diam: 2.00 cm MV Decel Time: 165 msec MR Peak grad:    136.0 mmHg MR Mean grad:    102.0 mmHg MR Vmax:         583.00 cm/s MR Vmean:        485.0 cm/s MR PISA:         0.57 cm MR PISA Eff ROA: 9 mm MR PISA Radius:  0.30 cm MV E velocity: 166.00 cm/s MV A velocity: 178.00 cm/s MV E/A ratio:  0.93 Redell Cave MD Electronically signed by Redell Cave MD Signature Date/Time: 04/24/2023/2:16:38 PM    Final      Assessment/Plan: 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

## 2023-04-24 NOTE — Progress Notes (Signed)
 [facility]  ROUNDING NOTE   Subjective:   [name] [name] is a 77 year old male with past medical conditions including CAD, basal cell carcinoma, arthritis, COPD, gout, CHF, hyperlipidemia, hypertension, and chronic kidney disease stage IV.Patient presents to the emergency department complaining of shortness of breath and has been admitted for Acute respiratory failure (HCC) [J96.00] Bacteremia [R78.81]  Patient is known to our practice and is followed outpatient by Dr. Douglas.  Patient was last seen in office on 03/27/2023 for routine follow-up.  Patient reports usual state of health 1 day prior to presentation.  He does report generalized fatigue and malaise.  Reports shortness of breath progressive, even at rest.  Creatinine noted to be at baseline on ED arrival.  Creatinine 3.49 with GFR 17.  BNP 820.  Chest x-ray negative for acute findings.  Troponins flat.  Echo completed on 12/31 shows EF 50%.  We have been consulted to monitor chronic kidney disease.   Objective:  Vital signs in last 24 hours:  Temp:  [96.5 F (35.8 C)-98 F (36.7 C)] 97.9 F (36.6 C) (12/31 0842) Pulse Rate:  [61-81] 81 (12/31 1430) Resp:  [16-25] 18 (12/31 1430) BP: (125-181)/(63-97) 148/72 (12/31 1430) SpO2:  [91 %-100 %] 91 % (12/31 1430)  Weight change:  Filed Weights   04/22/23 0132  Weight: 90.3 kg    Intake/Output: I/O last 3 completed shifts: In: -  Out: 1100 [Urine:1100]   Intake/Output this shift:  Total I/O In: 100 [IV Piggyback:100] Out: -   Physical Exam: General: NAD  Head: Normocephalic, atraumatic. Moist oral mucosal membranes  Eyes: Anicteric  Lungs:  Clear to auscultation, normal effort  Heart: Regular rate and rhythm  Abdomen:  Soft, nontender,   Extremities: Trace peripheral edema.  Neurologic: Alert and oriented, moving all four extremities  Skin: No lesions  Access: None    Basic Metabolic Panel: Recent Labs  Lab 04/21/23 2352  04/22/23 0242 04/23/23 0457 04/24/23 0104  NA 138 138 138 136  K 4.7 4.5 4.1 4.2  CL 99 102 101 102  CO2 26 22 23  21*  GLUCOSE 132* 175* 157* 141*  BUN 76* 79* 97* 97*  CREATININE 3.49* 3.41* 3.99* 3.85*  CALCIUM 8.5* 7.8* 8.2* 7.9*    Liver Function Tests: Recent Labs  Lab 04/21/23 2352  AST 19  ALT 15  ALKPHOS 66  BILITOT 0.7  PROT 6.9  ALBUMIN 3.3*   No results for input(s): LIPASE, AMYLASE in the last 168 hours. No results for input(s): AMMONIA in the last 168 hours.  CBC: Recent Labs  Lab 04/21/23 2352 04/22/23 0242 04/23/23 0457 04/24/23 0104  WBC 7.3 8.1 8.8 9.6  NEUTROABS 6.1  --   --   --   HGB 11.7* 9.8* 9.6* 9.1*  HCT 36.0* 30.8* 29.1* 27.0*  MCV 98.9 100.3* 96.7 94.4  PLT 158 137* 142* 148*    Cardiac Enzymes: No results for input(s): CKTOTAL, CKMB, CKMBINDEX, TROPONINI in the last 168 hours.  BNP: Invalid input(s): POCBNP  CBG: Recent Labs  Lab 04/23/23 1141 04/23/23 1638 04/23/23 2221 04/24/23 0741 04/24/23 1114  GLUCAP 116* 158* 152* 112* 124*    Microbiology: Results for orders placed or performed during the hospital encounter of 04/21/23  Blood Culture (routine x 2)     Status: Abnormal   Collection Time: 04/21/23 11:52 PM   Specimen: Left Antecubital; Blood  Result Value Ref Range Status   Specimen Description   Final    LEFT ANTECUBITAL Performed at  [facility], [address], [place], [place] [address]    Special Requests   Final    BOTTLES DRAWN AEROBIC AND ANAEROBIC Blood Culture results may not be optimal due to an inadequate volume of blood received in culture bottles Performed at [facility], [address] [place], [place] [address]    Culture  Setup Time   Final    GRAM POSITIVE COCCI IN BOTH AEROBIC AND ANAEROBIC BOTTLES CRITICAL RESULT CALLED TO, READ BACK BY AND VERIFIED WITH: MADISON HUNT @1053  04/22/23 MJU GRAM STAIN REVIEWED-AGREE WITH RESULT    Culture (A)  Final     STREPTOCOCCUS GALLOLYTICUS THE SIGNIFICANCE OF ISOLATING THIS ORGANISM FROM A SINGLE SET OF BLOOD CULTURES WHEN MULTIPLE SETS ARE DRAWN IS UNCERTAIN. PLEASE NOTIFY THE MICROBIOLOGY DEPARTMENT WITHIN ONE WEEK IF SPECIATION AND SENSITIVITIES ARE REQUIRED. Performed at [facility], [address], [place], [place] [address]    Report Status 04/24/2023 FINAL  Final  Blood Culture ID Panel (Reflexed)     Status: Abnormal   Collection Time: 04/21/23 11:52 PM  Result Value Ref Range Status   Enterococcus faecalis NOT DETECTED NOT DETECTED Final   Enterococcus Faecium NOT DETECTED NOT DETECTED Final   Listeria monocytogenes NOT DETECTED NOT DETECTED Final   Staphylococcus species NOT DETECTED NOT DETECTED Final   Staphylococcus aureus (BCID) NOT DETECTED NOT DETECTED Final   Staphylococcus epidermidis NOT DETECTED NOT DETECTED Final   Staphylococcus lugdunensis NOT DETECTED NOT DETECTED Final   Streptococcus species DETECTED (A) NOT DETECTED Final    Comment: Not Enterococcus species, Streptococcus agalactiae, Streptococcus pyogenes, or Streptococcus pneumoniae. CRITICAL RESULT CALLED TO, READ BACK BY AND VERIFIED WITH: MADISON HUNT @1053  04/22/23 MJU    Streptococcus agalactiae NOT DETECTED NOT DETECTED Final   Streptococcus pneumoniae NOT DETECTED NOT DETECTED Final   Streptococcus pyogenes NOT DETECTED NOT DETECTED Final   A.calcoaceticus-baumannii NOT DETECTED NOT DETECTED Final   Bacteroides fragilis NOT DETECTED NOT DETECTED Final   Enterobacterales NOT DETECTED NOT DETECTED Final   Enterobacter cloacae complex NOT DETECTED NOT DETECTED Final   Escherichia coli NOT DETECTED NOT DETECTED Final   Klebsiella aerogenes NOT DETECTED NOT DETECTED Final   Klebsiella oxytoca NOT DETECTED NOT DETECTED Final   Klebsiella pneumoniae NOT DETECTED NOT DETECTED Final   Proteus species NOT DETECTED NOT DETECTED Final   Salmonella species NOT DETECTED NOT DETECTED Final   Serratia  marcescens NOT DETECTED NOT DETECTED Final   Haemophilus influenzae NOT DETECTED NOT DETECTED Final   Neisseria meningitidis NOT DETECTED NOT DETECTED Final   Pseudomonas aeruginosa NOT DETECTED NOT DETECTED Final   Stenotrophomonas maltophilia NOT DETECTED NOT DETECTED Final   Candida albicans NOT DETECTED NOT DETECTED Final   Candida auris NOT DETECTED NOT DETECTED Final   Candida glabrata NOT DETECTED NOT DETECTED Final   Candida krusei NOT DETECTED NOT DETECTED Final   Candida parapsilosis NOT DETECTED NOT DETECTED Final   Candida tropicalis NOT DETECTED NOT DETECTED Final   Cryptococcus neoformans/gattii NOT DETECTED NOT DETECTED Final    Comment: Performed at [facility], [address] [place], [place] [address]  Resp panel by RT-PCR (RSV, Flu A&B, Covid) Anterior Nasal Swab     Status: None   Collection Time: 04/21/23 11:56 PM   Specimen: Anterior Nasal Swab  Result Value Ref Range Status   SARS Coronavirus 2 by RT PCR NEGATIVE NEGATIVE Final    Comment: (NOTE) SARS-CoV-2 target nucleic acids are NOT DETECTED.  The SARS-CoV-2 RNA  is generally detectable in upper respiratory specimens during the acute phase of infection. The lowest concentration of SARS-CoV-2 viral copies this assay can detect is 138 copies/mL. A negative result does not preclude SARS-Cov-2 infection and should not be used as the sole basis for treatment or other patient management decisions. A negative result may occur with  improper specimen collection/handling, submission of specimen other than nasopharyngeal swab, presence of viral mutation(s) within the areas targeted by this assay, and inadequate number of viral copies(<138 copies/mL). A negative result must be combined with clinical observations, patient history, and epidemiological information. The expected result is Negative.  Fact Sheet for Patients:  bloggercourse.com  Fact Sheet for Healthcare Providers:   seriousbroker.it  This test is no t yet approved or cleared by the [place]  FDA and  has been authorized for detection and/or diagnosis of SARS-CoV-2 by FDA under an Emergency Use Authorization (EUA). This EUA will remain  in effect (meaning this test can be used) for the duration of the COVID-19 declaration under Section 564(b)(1) of the Act, 21 U.S.C.section 360bbb-3(b)(1), unless the authorization is terminated  or revoked sooner.       Influenza A by PCR NEGATIVE NEGATIVE Final   Influenza B by PCR NEGATIVE NEGATIVE Final    Comment: (NOTE) The Xpert Xpress SARS-CoV-2/FLU/RSV plus assay is intended as an aid in the diagnosis of influenza from Nasopharyngeal swab specimens and should not be used as a sole basis for treatment. Nasal washings and aspirates are unacceptable for Xpert Xpress SARS-CoV-2/FLU/RSV testing.  Fact Sheet for Patients: bloggercourse.com  Fact Sheet for Healthcare Providers: seriousbroker.it  This test is not yet approved or cleared by the [place]  FDA and has been authorized for detection and/or diagnosis of SARS-CoV-2 by FDA under an Emergency Use Authorization (EUA). This EUA will remain in effect (meaning this test can be used) for the duration of the COVID-19 declaration under Section 564(b)(1) of the Act, 21 U.S.C. section 360bbb-3(b)(1), unless the authorization is terminated or revoked.     Resp Syncytial Virus by PCR NEGATIVE NEGATIVE Final    Comment: (NOTE) Fact Sheet for Patients: bloggercourse.com  Fact Sheet for Healthcare Providers: seriousbroker.it  This test is not yet approved or cleared by the [place]  FDA and has been authorized for detection and/or diagnosis of SARS-CoV-2 by FDA under an Emergency Use Authorization (EUA). This EUA will remain in effect (meaning this test can be used) for  the duration of the COVID-19 declaration under Section 564(b)(1) of the Act, 21 U.S.C. section 360bbb-3(b)(1), unless the authorization is terminated or revoked.  Performed at [facility], [address] [place], [place] [address]   Blood Culture (routine x 2)     Status: None (Preliminary result)   Collection Time: 04/22/23  9:49 PM   Specimen: BLOOD  Result Value Ref Range Status   Specimen Description BLOOD BLOOD LEFT ARM  Final   Special Requests   Final    BOTTLES DRAWN AEROBIC AND ANAEROBIC Blood Culture results may not be optimal due to an inadequate volume of blood received in culture bottles   Culture   Final    NO GROWTH 2 DAYS Performed at [facility], [address], [place], [place] [address]    Report Status PENDING  Incomplete    Coagulation Studies: Recent Labs    04/21/23 2352  LABPROT 14.0  INR 1.1    Urinalysis: Recent Labs    04/21/23 2356  COLORURINE YELLOW*  LABSPEC 1.013  PHURINE  5.0  GLUCOSEU NEGATIVE  HGBUR NEGATIVE  BILIRUBINUR NEGATIVE  KETONESUR NEGATIVE  PROTEINUR >=300*  NITRITE NEGATIVE  LEUKOCYTESUR NEGATIVE      Imaging: ECHOCARDIOGRAM COMPLETE Result Date: 04/24/2023    ECHOCARDIOGRAM REPORT   Patient Name:   [name] [name] Date of Exam: 04/24/2023 Medical Rec #:  [id number]               Height:       63.0 in Accession #:    [id number]              Weight:       199.1 lb Date of Birth:  12-Oct-1945              BSA:          1.930 m Patient Age:    77 years                BP:           166/72 mmHg Patient Gender: M                       HR:           70 bpm. Exam Location:  [facility] Procedure: 2D Echo, Cardiac Doppler and Color Doppler Indications:     Bacteremia  History:         Patient has prior history of Echocardiogram examinations, most                  recent 07/28/2020. Signs/Symptoms:Dyspnea; Risk                  Factors:Hypertension and Diabetes. 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 Valves replaced at [facility] in 2017 per                  patient.  Sonographer:     Naomie Reef Referring Phys:  [id number] DONALD BERLIN Diagnosing Phys: Redell Cave MD IMPRESSIONS  1. Left ventricular ejection fraction, by estimation, is 50%. The left ventricle has low normal function. The left ventricle has no regional wall motion abnormalities. There is mild left ventricular hypertrophy. Left ventricular diastolic parameters are  indeterminate.  2. Right ventricular systolic function is low normal. The right ventricular size is normal. There is mildly elevated pulmonary artery systolic pressure.  3. Left atrial size was severely dilated.  4. The mitral valve is degenerative. Mild to moderate mitral valve regurgitation. Moderate mitral stenosis. The mean mitral valve gradient is 7.5 mmHg. Moderate mitral annular calcification.  5. The aortic valve has been repaired/replaced. Aortic valve regurgitation is not visualized. Aortic valve mean gradient measures 12.0 mmHg.  6. Aortic dilatation noted. There is mild dilatation of the aortic root, measuring 41 mm. FINDINGS  Left Ventricle: Left ventricular ejection fraction, by estimation, is 50%. The left ventricle has low normal function. The left ventricle has no regional wall motion abnormalities. The left ventricular internal cavity size was normal in size. There is mild left ventricular hypertrophy. Left ventricular diastolic parameters are indeterminate. Right Ventricle: The right ventricular size is normal. No increase in right ventricular wall thickness. Right ventricular systolic function is low normal. There is mildly elevated pulmonary artery systolic pressure.  The tricuspid regurgitant velocity is 2.79 m/s, and with an assumed right atrial pressure of 8 mmHg, the estimated right ventricular systolic pressure is 39.1 mmHg. Left Atrium: Left atrial size was  severely dilated. Right Atrium: Right atrial size was normal in size. Pericardium: There is no evidence of pericardial effusion. Mitral Valve: The mitral valve is degenerative in appearance. Moderate mitral annular calcification. Mild to moderate mitral valve regurgitation. Moderate mitral valve stenosis. MV peak gradient, 18.1 mmHg. The mean mitral valve gradient is 7.5 mmHg. Tricuspid Valve: The tricuspid valve is normal in structure. Tricuspid valve regurgitation is mild. Aortic Valve: The aortic valve has been repaired/replaced. Aortic valve regurgitation is not visualized. Aortic valve mean gradient measures 12.0 mmHg. Aortic valve peak gradient measures 21.1 mmHg. Aortic valve area, by VTI measures 1.76 cm. Pulmonic Valve: The pulmonic valve was not well visualized. Pulmonic valve regurgitation is not visualized. Aorta: Aortic dilatation noted. There is mild dilatation of the aortic root, measuring 41 mm. Venous: The inferior vena cava was not well visualized. IAS/Shunts: No atrial level shunt detected by color flow Doppler.  LEFT VENTRICLE PLAX 2D LVIDd:         5.50 cm      Diastology LVIDs:         4.00 cm      LV e' lateral:   6.74 cm/s LV PW:         1.30 cm      LV E/e' lateral: 24.6 LV IVS:        1.40 cm LVOT diam:     2.00 cm LV SV:         102 LV SV Index:   53 LVOT Area:     3.14 cm  LV Volumes (MOD) LV vol d, MOD A2C: 113.0 ml LV vol d, MOD A4C: 91.9 ml LV vol s, MOD A2C: 59.1 ml LV vol s, MOD A4C: 51.3 ml LV SV MOD A2C:     53.9 ml LV SV MOD A4C:     91.9 ml LV SV MOD BP:      44.7 ml RIGHT VENTRICLE RV Basal diam:  3.60 cm RV Mid diam:    2.90 cm RV S prime:     12.70 cm/s TAPSE (M-mode): 2.1 cm LEFT ATRIUM              Index        RIGHT ATRIUM           Index LA diam:        4.90 cm  2.54 cm/m   RA Area:     17.10 cm LA Vol (A2C):   137.0 ml 70.99 ml/m  RA Volume:   46.10 ml  23.89 ml/m LA Vol (A4C):   122.0 ml 63.22 ml/m LA Biplane Vol: 134.0 ml 69.44 ml/m  AORTIC VALVE                      PULMONIC VALVE AV Area (Vmax):    1.83 cm      PV Vmax:       1.07 m/s AV Area (Vmean):   1.71 cm      PV Peak grad:  4.6 mmHg AV Area (VTI):     1.76 cm AV Vmax:           229.50 cm/s AV Vmean:          161.000 cm/s AV VTI:  0.579 m AV Peak Grad:      21.1 mmHg AV Mean Grad:      12.0 mmHg LVOT Vmax:         134.00 cm/s LVOT Vmean:        87.600 cm/s LVOT VTI:          0.325 m LVOT/AV VTI ratio: 0.56  AORTA Ao Root diam: 4.10 cm MITRAL VALVE                  TRICUSPID VALVE MV Area (PHT): 4.60 cm       TR Peak grad:   31.1 mmHg MV Area VTI:   1.86 cm       TR Vmax:        279.00 cm/s MV Peak grad:  18.1 mmHg MV Mean grad:  7.5 mmHg       SHUNTS MV Vmax:       2.12 m/s       Systemic VTI:  0.32 m MV Vmean:      129.0 cm/s     Systemic Diam: 2.00 cm MV Decel Time: 165 msec MR Peak grad:    136.0 mmHg MR Mean grad:    102.0 mmHg MR Vmax:         583.00 cm/s MR Vmean:        485.0 cm/s MR PISA:         0.57 cm MR PISA Eff ROA: 9 mm MR PISA Radius:  0.30 cm MV E velocity: 166.00 cm/s MV A velocity: 178.00 cm/s MV E/A ratio:  0.93 Redell Cave MD Electronically signed by Redell Cave MD Signature Date/Time: 04/24/2023/2:16:38 PM    Final      Medications:    cefTRIAXone  (ROCEPHIN )  IV Stopped (04/24/23 0916)    vitamin C   1,000 mg Oral QHS   aspirin  EC  81 mg Oral Daily   calcitRIOL   0.25 mcg Oral Daily   citalopram   40 mg Oral Daily   docusate sodium   200 mg Oral BID   enoxaparin  (LOVENOX ) injection  30 mg Subcutaneous Q24H   ezetimibe   10 mg Oral QHS   febuxostat   40 mg Oral Daily   gabapentin   200 mg Oral Daily   gabapentin   400 mg Oral QHS   guaiFENesin   600 mg Oral BID   insulin  aspart  0-15 Units Subcutaneous TID WC   insulin  aspart  0-5 Units Subcutaneous QHS   ipratropium-albuterol   3 mL Nebulization QID   metoprolol  succinate  50 mg Oral Daily   multivitamin  1 tablet Oral BID   pantoprazole   40 mg Oral Daily   predniSONE   40 mg Oral Q breakfast    tamsulosin   0.4 mg Oral Daily   acetaminophen  **OR** acetaminophen , busPIRone , chlorpheniramine-HYDROcodone , ipratropium-albuterol , loratadine , magnesium  hydroxide, ondansetron  **OR** ondansetron  (ZOFRAN ) IV, traZODone   Assessment/ Plan:  [name] is a 77 y.o.  male with past medical conditions including CAD, basal cell carcinoma, arthritis, COPD, gout, CHF, hyperlipidemia, hypertension, and chronic kidney disease stage IV.Patient presents to the emergency department complaining of shortness of breath and has been admitted for Acute respiratory failure (HCC) [J96.00] Bacteremia [R78.81]  Chronic kidney disease stage IV with baseline creatinine 3.59 and GFR of 17 on 12/11/22.  Renal function remains at baseline.  Will continue to monitor during this admission.  No acute indication for dialysis.   Lab Results  Component Value Date   CREATININE 3.85 (H) 04/24/2023   CREATININE 3.99 (H) 04/23/2023  CREATININE 3.41 (H) 04/22/2023    Intake/Output Summary (Last 24 hours) at 04/24/2023 1527 Last data filed at 04/24/2023 0916 Gross per 24 hour  Intake 100 ml  Output 500 ml  Net -400 ml   2.  Acute respiratory failure likely secondary to COPD exacerbation versus diastolic CHF exacerbation.  Primary team is ordered supportive care.  Diuretics held.    3.  Bacteremia, positive blood cultures growing strep gallolyticus.  ID consulted, may consider TEE.  4. Anemia of chronic kidney disease Lab Results  Component Value Date   HGB 9.1 (L) 04/24/2023    Hgb within desired range. Will continue to monitor.    LOS: 2 [name] Fanguy 12/31/20243:27 PM

## 2023-04-24 NOTE — Progress Notes (Signed)
 SUBJECTIVE: Patient is still short of breath   Vitals:   04/24/23 0700 04/24/23 0730 04/24/23 0800 04/24/23 0842  BP: (!) 181/78 (!) 177/76 (!) 166/72   Pulse: 61 63 61   Resp: (!) 21 (!) 25 19   Temp:    97.9 F (36.6 C)  TempSrc:    Oral  SpO2: 99% 99% 100%   Weight:        Intake/Output Summary (Last 24 hours) at 04/24/2023 1032 Last data filed at 04/24/2023 0916 Gross per 24 hour  Intake 100 ml  Output 500 ml  Net -400 ml    LABS: Basic Metabolic Panel: Recent Labs    04/23/23 0457 04/24/23 0104  NA 138 136  K 4.1 4.2  CL 101 102  CO2 23 21*  GLUCOSE 157* 141*  BUN 97* 97*  CREATININE 3.99* 3.85*  CALCIUM 8.2* 7.9*   Liver Function Tests: Recent Labs    04/21/23 2352  AST 19  ALT 15  ALKPHOS 66  BILITOT 0.7  PROT 6.9  ALBUMIN 3.3*   No results for input(s): LIPASE, AMYLASE in the last 72 hours. CBC: Recent Labs    04/21/23 2352 04/22/23 0242 04/23/23 0457 04/24/23 0104  WBC 7.3   < > 8.8 9.6  NEUTROABS 6.1  --   --   --   HGB 11.7*   < > 9.6* 9.1*  HCT 36.0*   < > 29.1* 27.0*  MCV 98.9   < > 96.7 94.4  PLT 158   < > 142* 148*   < > = values in this interval not displayed.   Cardiac Enzymes: No results for input(s): CKTOTAL, CKMB, CKMBINDEX, TROPONINI in the last 72 hours. BNP: Invalid input(s): POCBNP D-Dimer: No results for input(s): DDIMER in the last 72 hours. Hemoglobin A1C: No results for input(s): HGBA1C in the last 72 hours. Fasting Lipid Panel: No results for input(s): CHOL, HDL, LDLCALC, TRIG, CHOLHDL, LDLDIRECT in the last 72 hours. Thyroid  Function Tests: No results for input(s): TSH, T4TOTAL, T3FREE, THYROIDAB in the last 72 hours.  Invalid input(s): FREET3 Anemia Panel: No results for input(s): VITAMINB12, FOLATE, FERRITIN, TIBC, IRON, RETICCTPCT in the last 72 hours.   PHYSICAL EXAM General: Well developed, well nourished, in no acute distress HEENT:   Normocephalic and atramatic Neck:  No JVD.  Lungs: Clear bilaterally to auscultation and percussion. Heart: HRRR . Normal S1 and S2 without gallops or murmurs.  Abdomen: Bowel sounds are positive, abdomen soft and non-tender  Msk:  Back normal, normal gait. Normal strength and tone for age. Extremities: No clubbing, cyanosis or edema.   Neuro: Alert and oriented X 3. Psych:  Good affect, responds appropriately  TELEMETRY: Sinus rhythm  ASSESSMENT AND PLAN: #1 acute respiratory failure with hypoxia due to CHF and COPD exacerbation.  Patient is gradually getting better. 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  Infectious disease would like to have a transesophageal echocardiogram to rule out endocarditis.  Discussed that with patient's wife and the patient and they understand the procedure.  Because of the holidays we will have to set it up for Thursday or Friday.  Explained to the wife and the patient that he will have to stay here because of sepsis and cannot go home.   ICD-10-CM   1. Acute respiratory failure with hypoxia (HCC)  J96.01       Principal Problem:   Acute respiratory failure (HCC) Active Problems:  Essential hypertension   BPH (benign prostatic hyperplasia)   Gout   Dyslipidemia   Anxiety and depression   GERD without esophagitis   Type 2 diabetes mellitus with chronic kidney disease, without long-term current use of insulin  [facility])    Denyse Bathe, MD, [facility] 04/24/2023 10:32 AM

## 2023-04-25 DIAGNOSIS — I5043 Acute on chronic combined systolic (congestive) and diastolic (congestive) heart failure: Secondary | ICD-10-CM

## 2023-04-25 DIAGNOSIS — J9601 Acute respiratory failure with hypoxia: Secondary | ICD-10-CM | POA: Diagnosis not present

## 2023-04-25 DIAGNOSIS — E1122 Type 2 diabetes mellitus with diabetic chronic kidney disease: Secondary | ICD-10-CM

## 2023-04-25 DIAGNOSIS — F32A Depression, unspecified: Secondary | ICD-10-CM

## 2023-04-25 DIAGNOSIS — J441 Chronic obstructive pulmonary disease with (acute) exacerbation: Secondary | ICD-10-CM | POA: Diagnosis not present

## 2023-04-25 DIAGNOSIS — R7881 Bacteremia: Secondary | ICD-10-CM

## 2023-04-25 DIAGNOSIS — N184 Chronic kidney disease, stage 4 (severe): Secondary | ICD-10-CM

## 2023-04-25 DIAGNOSIS — F419 Anxiety disorder, unspecified: Secondary | ICD-10-CM

## 2023-04-25 DIAGNOSIS — K219 Gastro-esophageal reflux disease without esophagitis: Secondary | ICD-10-CM

## 2023-04-25 DIAGNOSIS — I10 Essential (primary) hypertension: Secondary | ICD-10-CM

## 2023-04-25 DIAGNOSIS — N400 Enlarged prostate without lower urinary tract symptoms: Secondary | ICD-10-CM

## 2023-04-25 DIAGNOSIS — B955 Unspecified streptococcus as the cause of diseases classified elsewhere: Secondary | ICD-10-CM

## 2023-04-25 DIAGNOSIS — E785 Hyperlipidemia, unspecified: Secondary | ICD-10-CM

## 2023-04-25 HISTORY — DX: Acute on chronic combined systolic (congestive) and diastolic (congestive) heart failure: I50.43

## 2023-04-25 LAB — CBC
HCT: 28.8 % — ABNORMAL LOW (ref 39.0–52.0)
Hemoglobin: 9.7 g/dL — ABNORMAL LOW (ref 13.0–17.0)
MCH: 32.6 pg (ref 26.0–34.0)
MCHC: 33.7 g/dL (ref 30.0–36.0)
MCV: 96.6 fL (ref 80.0–100.0)
Platelets: 146 10*3/uL — ABNORMAL LOW (ref 150–400)
RBC: 2.98 MIL/uL — ABNORMAL LOW (ref 4.22–5.81)
RDW: 13.4 % (ref 11.5–15.5)
WBC: 9.7 10*3/uL (ref 4.0–10.5)
nRBC: 0 % (ref 0.0–0.2)

## 2023-04-25 LAB — BASIC METABOLIC PANEL
Anion gap: 12 (ref 5–15)
BUN: 92 mg/dL — ABNORMAL HIGH (ref 8–23)
CO2: 22 mmol/L (ref 22–32)
Calcium: 8.1 mg/dL — ABNORMAL LOW (ref 8.9–10.3)
Chloride: 105 mmol/L (ref 98–111)
Creatinine, Ser: 3.91 mg/dL — ABNORMAL HIGH (ref 0.61–1.24)
GFR, Estimated: 15 mL/min — ABNORMAL LOW (ref >60)
Glucose, Bld: 102 mg/dL — ABNORMAL HIGH (ref 70–99)
Potassium: 4.4 mmol/L (ref 3.5–5.1)
Sodium: 139 mmol/L (ref 135–145)

## 2023-04-25 LAB — URINE CULTURE: Culture: NO GROWTH

## 2023-04-25 LAB — GLUCOSE, CAPILLARY
Glucose-Capillary: 79 mg/dL (ref 70–99)
Glucose-Capillary: 140 mg/dL — ABNORMAL HIGH (ref 70–99)
Glucose-Capillary: 130 mg/dL — ABNORMAL HIGH (ref 70–99)
Glucose-Capillary: 148 mg/dL — ABNORMAL HIGH (ref 70–99)

## 2023-04-25 NOTE — Assessment & Plan Note (Addendum)
 Continue bronchodilators.  Given a dose of Solu-Medrol on the day of discharge and completed his therapy for COPD exacerbation.

## 2023-04-25 NOTE — Assessment & Plan Note (Addendum)
 Last EF 50% with moderate mitral regurgitation.  Diuretics on hold during the hospital course, can restart as outpatient..  On Toprol.

## 2023-04-25 NOTE — Plan of Care (Signed)
  Problem: Education: Goal: Ability to describe self-care measures that may prevent or decrease complications (Diabetes Survival Skills Education) will improve Outcome: Progressing Goal: Individualized Educational Video(s) Outcome: Progressing   Problem: Coping: Goal: Ability to adjust to condition or change in health will improve Outcome: Progressing   Problem: Fluid Volume: Goal: Ability to maintain a balanced intake and output will improve Outcome: Progressing   Problem: Health Behavior/Discharge Planning: Goal: Ability to identify and utilize available resources and services will improve Outcome: Progressing Goal: Ability to manage health-related needs will improve Outcome: Progressing   Problem: Metabolic: Goal: Ability to maintain appropriate glucose levels will improve Outcome: Progressing   Problem: Nutritional: Goal: Maintenance of adequate nutrition will improve Outcome: Progressing Goal: Progress toward achieving an optimal weight will improve Outcome: Progressing   Problem: Skin Integrity: Goal: Risk for impaired skin integrity will decrease Outcome: Progressing   Problem: Tissue Perfusion: Goal: Adequacy of tissue perfusion will improve Outcome: Progressing   Problem: Education: Goal: Knowledge of General Education information will improve Description: Including pain rating scale, medication(s)/side effects and non-pharmacologic comfort measures Outcome: Progressing   Problem: Health Behavior/Discharge Planning: Goal: Ability to manage health-related needs will improve Outcome: Progressing

## 2023-04-25 NOTE — Plan of Care (Addendum)
 Plan is alert and oriented X 4. He is in oxygen  3 lit /min via nasal cannula. He has a diminished breath sound in both lungs. Denies any pain.    Problem: Education: Goal: Ability to describe self-care measures that may prevent or decrease complications (Diabetes Survival Skills Education) will improve Outcome: Progressing Goal: Individualized Educational Video(s) Outcome: Progressing   Problem: Coping: Goal: Ability to adjust to condition or change in health will improve Outcome: Progressing   Problem: Fluid Volume: Goal: Ability to maintain a balanced intake and output will improve Outcome: Progressing   Problem: Health Behavior/Discharge Planning: Goal: Ability to identify and utilize available resources and services will improve Outcome: Progressing Goal: Ability to manage health-related needs will improve Outcome: Progressing   Problem: Metabolic: Goal: Ability to maintain appropriate glucose levels will improve Outcome: Progressing   Problem: Nutritional: Goal: Maintenance of adequate nutrition will improve Outcome: Progressing Goal: Progress toward achieving an optimal weight will improve Outcome: Progressing   Problem: Skin Integrity: Goal: Risk for impaired skin integrity will decrease Outcome: Progressing   Problem: Tissue Perfusion: Goal: Adequacy of tissue perfusion will improve Outcome: Progressing   Problem: Education: Goal: Knowledge of General Education information will improve Description: Including pain rating scale, medication(s)/side effects and non-pharmacologic comfort measures Outcome: Progressing   Problem: Health Behavior/Discharge Planning: Goal: Ability to manage health-related needs will improve Outcome: Progressing   Problem: Clinical Measurements: Goal: Ability to maintain clinical measurements within normal limits will improve Outcome: Progressing Goal: Will remain free from infection Outcome: Progressing Goal: Diagnostic test  results will improve Outcome: Progressing Goal: Respiratory complications will improve Outcome: Progressing Goal: Cardiovascular complication will be avoided Outcome: Progressing   Problem: Activity: Goal: Risk for activity intolerance will decrease Outcome: Progressing   Problem: Nutrition: Goal: Adequate nutrition will be maintained Outcome: Progressing   Problem: Coping: Goal: Level of anxiety will decrease Outcome: Progressing   Problem: Elimination: Goal: Will not experience complications related to bowel motility Outcome: Progressing Goal: Will not experience complications related to urinary retention Outcome: Progressing   Problem: Pain Management: Goal: General experience of comfort will improve Outcome: Progressing   Problem: Safety: Goal: Ability to remain free from injury will improve Outcome: Progressing   Problem: Skin Integrity: Goal: Risk for impaired skin integrity will decrease Outcome: Progressing   Problem: Education: Goal: Knowledge of disease or condition will improve Outcome: Progressing Goal: Knowledge of the prescribed therapeutic regimen will improve Outcome: Progressing Goal: Individualized Educational Video(s) Outcome: Progressing   Problem: Activity: Goal: Ability to tolerate increased activity will improve Outcome: Progressing Goal: Will verbalize the importance of balancing activity with adequate rest periods Outcome: Progressing   Problem: Respiratory: Goal: Ability to maintain a clear airway will improve Outcome: Progressing Goal: Levels of oxygenation will improve Outcome: Progressing Goal: Ability to maintain adequate ventilation will improve Outcome: Progressing

## 2023-04-25 NOTE — Assessment & Plan Note (Addendum)
 Last creatinine 3.15 with a GFR 19

## 2023-04-25 NOTE — Hospital Course (Addendum)
 78 year old man past medical history of congestive heart failure, CAD, GERD, gout, chronic oxygen .  Dyslipidemia, obstructive sleep apnea, stage IV chronic kidney disease presented to the emergency room with worsening shortness of breath, fever.  Patient was found to have Streptococcus gallolyticus and blood cultures.  Echocardiogram does not show any endocarditis but its moderate mitral regurgitation.  1/1.  Continue IV Rocephin .  TEE will need to be done to determine length of treatment. 1/2.  TEE negative for endocarditis.  Plan for potential discharge tomorrow after IV Rocephin  dose.  Will be switched over to amoxicillin .  Will need repeat blood cultures 2 weeks and 4 weeks after. 1/3.  Case discussed with Dr. Aundria gastroenterology and he came and saw the patient decided to do colonoscopy while he is here.  Case discussed with infectious disease specialist and the Streptococcus is sensitive to amoxicillin  and recommends another 9 days of amoxicillin  when we do discharge him. 1/4.  Colonoscopy done showing multiple polyps that were removed.  Patient given nebulizer treatments before and after colonoscopy.  Patient feels okay and wants to go home.  On reevaluation this afternoon lungs did not have any wheezing and patient was moving good air.  Advised patient to hold aspirin  for few days after colonoscopy.

## 2023-04-25 NOTE — Progress Notes (Signed)
 Gave to RN to give to pt as MD was in with pt

## 2023-04-25 NOTE — Care Management Important Message (Signed)
 Important Message  Patient Details  Name: [name] [name] MRN: [id number] Date of Birth: 08-16-1945   Important Message Given:  Yes - Medicare IM     Cristela Blue, CMA 04/25/2023, 10:32 AM

## 2023-04-25 NOTE — Progress Notes (Signed)
 [facility]  ROUNDING NOTE   Subjective:   [name] [name] is a 78 year old male with past medical conditions including CAD, basal cell carcinoma, arthritis, COPD, gout, CHF, hyperlipidemia, hypertension, and chronic kidney disease stage IV.Patient presents to the emergency department complaining of shortness of breath and has been admitted for Acute respiratory failure (HCC) [J96.00] Bacteremia [R78.81] Acute respiratory failure with hypoxia (HCC) [J96.01]  Patient is known to our practice and is followed outpatient by Dr. Douglas.  Patient was last seen in office on 03/27/2023 for routine follow-up.  Patient reports usual state of health 1 day prior to presentation.  He does report generalized fatigue and malaise.  Reports shortness of breath progressive, even at rest.  Creatinine 3.91, GFR of 15. UOP not recorded.   Patient in a good mood this morning. States he is breathing better.    Objective:  Vital signs in last 24 hours:  Temp:  [97.5 F (36.4 C)-97.8 F (36.6 C)] 97.8 F (36.6 C) (01/01 0746) Pulse Rate:  [63-81] 66 (01/01 0746) Resp:  [18-22] 18 (01/01 0746) BP: (137-166)/(67-119) 165/67 (01/01 0746) SpO2:  [91 %-100 %] 99 % (01/01 0746) Weight:  [89.3 kg] 89.3 kg (12/31 2052)  Weight change:  Filed Weights   04/22/23 0132 04/24/23 2052  Weight: 90.3 kg 89.3 kg    Intake/Output: I/O last 3 completed shifts: In: 100 [IV Piggyback:100] Out: -    Intake/Output this shift:  No intake/output data recorded.  Physical Exam: General: NAD, laying in bed  Head: Normocephalic, atraumatic. Moist oral mucosal membranes  Eyes: Anicteric  Lungs:  Clear to auscultation, 3L [place] O2  Heart: Regular rate and rhythm  Abdomen:  Soft, nontender,   Extremities: no peripheral edema.  Neurologic: Alert and oriented, moving all four extremities  Skin: No lesions  Access: None    Basic Metabolic Panel: Recent Labs  Lab 04/21/23 2352 04/22/23 0242  04/23/23 0457 04/24/23 0104 04/25/23 0332  NA 138 138 138 136 139  K 4.7 4.5 4.1 4.2 4.4  CL 99 102 101 102 105  CO2 26 22 23  21* 22  GLUCOSE 132* 175* 157* 141* 102*  BUN 76* 79* 97* 97* 92*  CREATININE 3.49* 3.41* 3.99* 3.85* 3.91*  CALCIUM 8.5* 7.8* 8.2* 7.9* 8.1*    Liver Function Tests: Recent Labs  Lab 04/21/23 2352  AST 19  ALT 15  ALKPHOS 66  BILITOT 0.7  PROT 6.9  ALBUMIN 3.3*   No results for input(s): LIPASE, AMYLASE in the last 168 hours. No results for input(s): AMMONIA in the last 168 hours.  CBC: Recent Labs  Lab 04/21/23 2352 04/22/23 0242 04/23/23 0457 04/24/23 0104 04/25/23 0332  WBC 7.3 8.1 8.8 9.6 9.7  NEUTROABS 6.1  --   --   --   --   HGB 11.7* 9.8* 9.6* 9.1* 9.7*  HCT 36.0* 30.8* 29.1* 27.0* 28.8*  MCV 98.9 100.3* 96.7 94.4 96.6  PLT 158 137* 142* 148* 146*    Cardiac Enzymes: No results for input(s): CKTOTAL, CKMB, CKMBINDEX, TROPONINI in the last 168 hours.  BNP: Invalid input(s): POCBNP  CBG: Recent Labs  Lab 04/24/23 0741 04/24/23 1114 04/24/23 1632 04/24/23 2140 04/25/23 0736  GLUCAP 112* 124* 175* 103* 79    Microbiology: Results for orders placed or performed during the hospital encounter of 04/21/23  Blood Culture (routine x 2)     Status: Abnormal   Collection Time: 04/21/23 11:52 PM   Specimen: Left Antecubital; Blood  Result Value Ref  Range Status   Specimen Description   Final    LEFT ANTECUBITAL Performed at [facility], [address] [place], [place] [address]    Special Requests   Final    BOTTLES DRAWN AEROBIC AND ANAEROBIC Blood Culture results may not be optimal due to an inadequate volume of blood received in culture bottles Performed at [facility], [address] [place], [place] [address]    Culture  Setup Time   Final    GRAM POSITIVE COCCI IN BOTH AEROBIC AND ANAEROBIC BOTTLES CRITICAL RESULT CALLED TO, READ BACK BY AND VERIFIED WITH: MADISON HUNT @1053   04/22/23 MJU GRAM STAIN REVIEWED-AGREE WITH RESULT    Culture (A)  Final    STREPTOCOCCUS GALLOLYTICUS THE SIGNIFICANCE OF ISOLATING THIS ORGANISM FROM A SINGLE SET OF BLOOD CULTURES WHEN MULTIPLE SETS ARE DRAWN IS UNCERTAIN. PLEASE NOTIFY THE MICROBIOLOGY DEPARTMENT WITHIN ONE WEEK IF SPECIATION AND SENSITIVITIES ARE REQUIRED. Performed at [facility], [address], [place], [place] [address]    Report Status 04/24/2023 FINAL  Final  Blood Culture ID Panel (Reflexed)     Status: Abnormal   Collection Time: 04/21/23 11:52 PM  Result Value Ref Range Status   Enterococcus faecalis NOT DETECTED NOT DETECTED Final   Enterococcus Faecium NOT DETECTED NOT DETECTED Final   Listeria monocytogenes NOT DETECTED NOT DETECTED Final   Staphylococcus species NOT DETECTED NOT DETECTED Final   Staphylococcus aureus (BCID) NOT DETECTED NOT DETECTED Final   Staphylococcus epidermidis NOT DETECTED NOT DETECTED Final   Staphylococcus lugdunensis NOT DETECTED NOT DETECTED Final   Streptococcus species DETECTED (A) NOT DETECTED Final    Comment: Not Enterococcus species, Streptococcus agalactiae, Streptococcus pyogenes, or Streptococcus pneumoniae. CRITICAL RESULT CALLED TO, READ BACK BY AND VERIFIED WITH: MADISON HUNT @1053  04/22/23 MJU    Streptococcus agalactiae NOT DETECTED NOT DETECTED Final   Streptococcus pneumoniae NOT DETECTED NOT DETECTED Final   Streptococcus pyogenes NOT DETECTED NOT DETECTED Final   A.calcoaceticus-baumannii NOT DETECTED NOT DETECTED Final   Bacteroides fragilis NOT DETECTED NOT DETECTED Final   Enterobacterales NOT DETECTED NOT DETECTED Final   Enterobacter cloacae complex NOT DETECTED NOT DETECTED Final   Escherichia coli NOT DETECTED NOT DETECTED Final   Klebsiella aerogenes NOT DETECTED NOT DETECTED Final   Klebsiella oxytoca NOT DETECTED NOT DETECTED Final   Klebsiella pneumoniae NOT DETECTED NOT DETECTED Final   Proteus species NOT DETECTED NOT DETECTED Final    Salmonella species NOT DETECTED NOT DETECTED Final   Serratia marcescens NOT DETECTED NOT DETECTED Final   Haemophilus influenzae NOT DETECTED NOT DETECTED Final   Neisseria meningitidis NOT DETECTED NOT DETECTED Final   Pseudomonas aeruginosa NOT DETECTED NOT DETECTED Final   Stenotrophomonas maltophilia NOT DETECTED NOT DETECTED Final   Candida albicans NOT DETECTED NOT DETECTED Final   Candida auris NOT DETECTED NOT DETECTED Final   Candida glabrata NOT DETECTED NOT DETECTED Final   Candida krusei NOT DETECTED NOT DETECTED Final   Candida parapsilosis NOT DETECTED NOT DETECTED Final   Candida tropicalis NOT DETECTED NOT DETECTED Final   Cryptococcus neoformans/gattii NOT DETECTED NOT DETECTED Final    Comment: Performed at [facility], [address] [place], [place] [address]  Resp panel by RT-PCR (RSV, Flu A&B, Covid) Anterior Nasal Swab     Status: None   Collection Time: 04/21/23 11:56 PM   Specimen: Anterior Nasal Swab  Result Value Ref Range Status   SARS Coronavirus 2 by RT PCR NEGATIVE NEGATIVE Final  Comment: (NOTE) SARS-CoV-2 target nucleic acids are NOT DETECTED.  The SARS-CoV-2 RNA is generally detectable in upper respiratory specimens during the acute phase of infection. The lowest concentration of SARS-CoV-2 viral copies this assay can detect is 138 copies/mL. A negative result does not preclude SARS-Cov-2 infection and should not be used as the sole basis for treatment or other patient management decisions. A negative result may occur with  improper specimen collection/handling, submission of specimen other than nasopharyngeal swab, presence of viral mutation(s) within the areas targeted by this assay, and inadequate number of viral copies(<138 copies/mL). A negative result must be combined with clinical observations, patient history, and epidemiological information. The expected result is Negative.  Fact Sheet for Patients:   bloggercourse.com  Fact Sheet for Healthcare Providers:  seriousbroker.it  This test is no t yet approved or cleared by the [place]  FDA and  has been authorized for detection and/or diagnosis of SARS-CoV-2 by FDA under an Emergency Use Authorization (EUA). This EUA will remain  in effect (meaning this test can be used) for the duration of the COVID-19 declaration under Section 564(b)(1) of the Act, 21 U.S.C.section 360bbb-3(b)(1), unless the authorization is terminated  or revoked sooner.       Influenza A by PCR NEGATIVE NEGATIVE Final   Influenza B by PCR NEGATIVE NEGATIVE Final    Comment: (NOTE) The Xpert Xpress SARS-CoV-2/FLU/RSV plus assay is intended as an aid in the diagnosis of influenza from Nasopharyngeal swab specimens and should not be used as a sole basis for treatment. Nasal washings and aspirates are unacceptable for Xpert Xpress SARS-CoV-2/FLU/RSV testing.  Fact Sheet for Patients: bloggercourse.com  Fact Sheet for Healthcare Providers: seriousbroker.it  This test is not yet approved or cleared by the [place]  FDA and has been authorized for detection and/or diagnosis of SARS-CoV-2 by FDA under an Emergency Use Authorization (EUA). This EUA will remain in effect (meaning this test can be used) for the duration of the COVID-19 declaration under Section 564(b)(1) of the Act, 21 U.S.C. section 360bbb-3(b)(1), unless the authorization is terminated or revoked.     Resp Syncytial Virus by PCR NEGATIVE NEGATIVE Final    Comment: (NOTE) Fact Sheet for Patients: bloggercourse.com  Fact Sheet for Healthcare Providers: seriousbroker.it  This test is not yet approved or cleared by the [place]  FDA and has been authorized for detection and/or diagnosis of SARS-CoV-2 by FDA under an Emergency Use  Authorization (EUA). This EUA will remain in effect (meaning this test can be used) for the duration of the COVID-19 declaration under Section 564(b)(1) of the Act, 21 U.S.C. section 360bbb-3(b)(1), unless the authorization is terminated or revoked.  Performed at [facility], [address] [place], [place] [address]   Blood Culture (routine x 2)     Status: None (Preliminary result)   Collection Time: 04/22/23  9:49 PM   Specimen: BLOOD  Result Value Ref Range Status   Specimen Description BLOOD BLOOD LEFT ARM  Final   Special Requests   Final    BOTTLES DRAWN AEROBIC AND ANAEROBIC Blood Culture results may not be optimal due to an inadequate volume of blood received in culture bottles   Culture   Final    NO GROWTH 3 DAYS Performed at [facility], [address] [place], [place] [address]    Report Status PENDING  Incomplete  Urine Culture (for pregnant, neutropenic or urologic patients or patients with an indwelling urinary catheter)     Status: None  Collection Time: 04/23/23  9:29 AM   Specimen: Urine, Clean Catch  Result Value Ref Range Status   Specimen Description   Final    URINE, CLEAN CATCH Performed at [facility], [address] [place], [place] [address]    Special Requests   Final    NONE Performed at [facility], [address] [place], [place] [address]    Culture   Final    NO GROWTH Performed at [facility], [address], [place], [place] [address]    Report Status 04/25/2023 FINAL  Final  Culture, blood (Routine X 2) w Reflex to ID Panel     Status: None (Preliminary result)   Collection Time: 04/24/23  9:56 AM   Specimen: BLOOD LEFT ARM  Result Value Ref Range Status   Specimen Description BLOOD LEFT ARM  Final   Special Requests   Final    BOTTLES DRAWN AEROBIC AND ANAEROBIC Blood Culture adequate volume   Culture   Final    NO GROWTH < 24 HOURS Performed at [facility], [address], [place], [place] [address]    Report Status PENDING  Incomplete  Culture, blood (Routine X 2) w Reflex to ID Panel     Status: None (Preliminary result)   Collection Time: 04/24/23  9:56 AM   Specimen: BLOOD LEFT ARM  Result Value Ref Range Status   Specimen Description BLOOD LEFT ARM  Final   Special Requests   Final    BOTTLES DRAWN AEROBIC AND ANAEROBIC Blood Culture adequate volume   Culture   Final    NO GROWTH < 24 HOURS Performed at [facility], [address] [place], [place] [address]    Report Status PENDING  Incomplete    Coagulation Studies: No results for input(s): LABPROT, INR in the last 72 hours.   Urinalysis: No results for input(s): COLORURINE, LABSPEC, PHURINE, GLUCOSEU, HGBUR, BILIRUBINUR, KETONESUR, PROTEINUR, UROBILINOGEN, NITRITE, LEUKOCYTESUR in the last 72 hours.  Invalid input(s): APPERANCEUR     Imaging: ECHOCARDIOGRAM COMPLETE Result Date: 04/24/2023    ECHOCARDIOGRAM REPORT   Patient Name:   LOVELLE LEMA Date of Exam: 04/24/2023 Medical Rec #:  [id number]               Height:       63.0 in Accession #:    [id number]              Weight:       199.1 lb Date of Birth:  Mar 03, 1946              BSA:          1.930 m Patient Age:    77 years                BP:           166/72 mmHg Patient Gender: M                       HR:           70 bpm. Exam Location:  [facility] Procedure: 2D Echo, Cardiac Doppler and Color Doppler Indications:     Bacteremia  History:         Patient has prior history of Echocardiogram examinations, most                  recent 07/28/2020. Signs/Symptoms:Dyspnea; Risk  Factors:Hypertension and Diabetes. 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 Valves replaced at [facility] in 2017 per                  patient.  Sonographer:     Naomie Reef Referring Phys:  [id number]  DONALD BERLIN Diagnosing Phys: Redell Cave MD IMPRESSIONS  1. Left ventricular ejection fraction, by estimation, is 50%. The left ventricle has low normal function. The left ventricle has no regional wall motion abnormalities. There is mild left ventricular hypertrophy. Left ventricular diastolic parameters are  indeterminate.  2. Right ventricular systolic function is low normal. The right ventricular size is normal. There is mildly elevated pulmonary artery systolic pressure.  3. Left atrial size was severely dilated.  4. The mitral valve is degenerative. Mild to moderate mitral valve regurgitation. Moderate mitral stenosis. The mean mitral valve gradient is 7.5 mmHg. Moderate mitral annular calcification.  5. The aortic valve has been repaired/replaced. Aortic valve regurgitation is not visualized. Aortic valve mean gradient measures 12.0 mmHg.  6. Aortic dilatation noted. There is mild dilatation of the aortic root, measuring 41 mm. FINDINGS  Left Ventricle: Left ventricular ejection fraction, by estimation, is 50%. The left ventricle has low normal function. The left ventricle has no regional wall motion abnormalities. The left ventricular internal cavity size was normal in size. There is mild left ventricular hypertrophy. Left ventricular diastolic parameters are indeterminate. Right Ventricle: The right ventricular size is normal. No increase in right ventricular wall thickness. Right ventricular systolic function is low normal. There is mildly elevated pulmonary artery systolic pressure. The tricuspid regurgitant velocity is 2.79 m/s, and with an assumed right atrial pressure of 8 mmHg, the estimated right ventricular systolic pressure is 39.1 mmHg. Left Atrium: Left atrial size was severely dilated. Right Atrium: Right atrial size was normal in size. Pericardium: There is no evidence of pericardial effusion. Mitral Valve: The mitral valve is degenerative in appearance. Moderate mitral annular  calcification. Mild to moderate mitral valve regurgitation. Moderate mitral valve stenosis. MV peak gradient, 18.1 mmHg. The mean mitral valve gradient is 7.5 mmHg. Tricuspid Valve: The tricuspid valve is normal in structure. Tricuspid valve regurgitation is mild. Aortic Valve: The aortic valve has been repaired/replaced. Aortic valve regurgitation is not visualized. Aortic valve mean gradient measures 12.0 mmHg. Aortic valve peak gradient measures 21.1 mmHg. Aortic valve area, by VTI measures 1.76 cm. Pulmonic Valve: The pulmonic valve was not well visualized. Pulmonic valve regurgitation is not visualized. Aorta: Aortic dilatation noted. There is mild dilatation of the aortic root, measuring 41 mm. Venous: The inferior vena cava was not well visualized. IAS/Shunts: No atrial level shunt detected by color flow Doppler.  LEFT VENTRICLE PLAX 2D LVIDd:         5.50 cm      Diastology LVIDs:         4.00 cm      LV e' lateral:   6.74 cm/s LV PW:         1.30 cm      LV E/e' lateral: 24.6 LV IVS:        1.40 cm LVOT diam:     2.00 cm LV SV:         102 LV  SV Index:   53 LVOT Area:     3.14 cm  LV Volumes (MOD) LV vol d, MOD A2C: 113.0 ml LV vol d, MOD A4C: 91.9 ml LV vol s, MOD A2C: 59.1 ml LV vol s, MOD A4C: 51.3 ml LV SV MOD A2C:     53.9 ml LV SV MOD A4C:     91.9 ml LV SV MOD BP:      44.7 ml RIGHT VENTRICLE RV Basal diam:  3.60 cm RV Mid diam:    2.90 cm RV S prime:     12.70 cm/s TAPSE (M-mode): 2.1 cm LEFT ATRIUM              Index        RIGHT ATRIUM           Index LA diam:        4.90 cm  2.54 cm/m   RA Area:     17.10 cm LA Vol (A2C):   137.0 ml 70.99 ml/m  RA Volume:   46.10 ml  23.89 ml/m LA Vol (A4C):   122.0 ml 63.22 ml/m LA Biplane Vol: 134.0 ml 69.44 ml/m  AORTIC VALVE                     PULMONIC VALVE AV Area (Vmax):    1.83 cm      PV Vmax:       1.07 m/s AV Area (Vmean):   1.71 cm      PV Peak grad:  4.6 mmHg AV Area (VTI):     1.76 cm AV Vmax:           229.50 cm/s AV Vmean:           161.000 cm/s AV VTI:            0.579 m AV Peak Grad:      21.1 mmHg AV Mean Grad:      12.0 mmHg LVOT Vmax:         134.00 cm/s LVOT Vmean:        87.600 cm/s LVOT VTI:          0.325 m LVOT/AV VTI ratio: 0.56  AORTA Ao Root diam: 4.10 cm MITRAL VALVE                  TRICUSPID VALVE MV Area (PHT): 4.60 cm       TR Peak grad:   31.1 mmHg MV Area VTI:   1.86 cm       TR Vmax:        279.00 cm/s MV Peak grad:  18.1 mmHg MV Mean grad:  7.5 mmHg       SHUNTS MV Vmax:       2.12 m/s       Systemic VTI:  0.32 m MV Vmean:      129.0 cm/s     Systemic Diam: 2.00 cm MV Decel Time: 165 msec MR Peak grad:    136.0 mmHg MR Mean grad:    102.0 mmHg MR Vmax:         583.00 cm/s MR Vmean:        485.0 cm/s MR PISA:         0.57 cm MR PISA Eff ROA: 9 mm MR PISA Radius:  0.30 cm MV E velocity: 166.00 cm/s MV A velocity: 178.00 cm/s MV E/A ratio:  0.93 Redell Cave MD Electronically signed by Redell Cave MD Signature Date/Time: 04/24/2023/2:16:38  PM    Final      Medications:    cefTRIAXone  (ROCEPHIN )  IV Stopped (04/25/23 1030)    vitamin C   1,000 mg Oral QHS   aspirin  EC  81 mg Oral Daily   calcitRIOL   0.25 mcg Oral Daily   citalopram   40 mg Oral Daily   docusate sodium   200 mg Oral BID   enoxaparin  (LOVENOX ) injection  30 mg Subcutaneous Q24H   ezetimibe   10 mg Oral QHS   febuxostat   40 mg Oral Daily   gabapentin   200 mg Oral Daily   gabapentin   400 mg Oral QHS   guaiFENesin   600 mg Oral BID   insulin  aspart  0-15 Units Subcutaneous TID WC   insulin  aspart  0-5 Units Subcutaneous QHS   metoprolol  succinate  50 mg Oral Daily   multivitamin  1 tablet Oral BID   pantoprazole   40 mg Oral Daily   predniSONE   40 mg Oral Q breakfast   tamsulosin   0.4 mg Oral Daily   acetaminophen  **OR** acetaminophen , busPIRone , chlorpheniramine-HYDROcodone , ipratropium-albuterol , loratadine , magnesium  hydroxide, ondansetron  **OR** ondansetron  (ZOFRAN ) IV, traZODone   Assessment/ Plan:  [name] [name] is a 78 y.o.  male with past medical conditions including CAD, basal cell carcinoma, arthritis, COPD, gout, CHF, hyperlipidemia, hypertension, and chronic kidney disease stage IV.Patient presents to the emergency department complaining of shortness of breath and has been admitted for Acute respiratory failure (HCC) [J96.00] Bacteremia [R78.81] Acute respiratory failure with hypoxia (HCC) [J96.01]  Chronic kidney disease stage IV with baseline creatinine 3.59 and GFR of 17 on 12/11/22.  Renal function remains at baseline.  Will continue to monitor during this admission.  No acute indication for dialysis.   Lab Results  Component Value Date   CREATININE 3.91 (H) 04/25/2023   CREATININE 3.85 (H) 04/24/2023   CREATININE 3.99 (H) 04/23/2023   No intake or output data in the 24 hours ending 04/25/23 1055  2.  Acute respiratory failure likely secondary to COPD exacerbation versus diastolic CHF exacerbation.   Diuretics held.    3.  Bacteremia, positive blood cultures growing strep gallolyticus.  ID consulted   4. Anemia of chronic kidney disease Lab Results  Component Value Date   HGB 9.7 (L) 04/25/2023    Hgb stable.   4. Hypertension: elevated readings.  - metoprolol  and tamsulosin    LOS: 3 [name] [name] 1/1/202510:55 AM

## 2023-04-25 NOTE — Progress Notes (Signed)
 Progress Note   Patient: [name] [name] FMW:[id number] DOB: 1945/09/17 DOA: 04/21/2023     3 DOS: the patient was seen and examined on 04/25/2023   Brief hospital course: 78 year old man past medical history of congestive heart failure, CAD, GERD, gout, chronic oxygen .  Dyslipidemia, obstructive sleep apnea, stage IV chronic kidney disease presented to the emergency room with worsening shortness of breath, fever.  Patient was found to have Streptococcus gallolyticus and blood cultures.  Echocardiogram does not show any endocarditis but its moderate mitral regurgitation.  1/1.  Continue IV Rocephin .  TEE will need to be done to determine length of treatment.  Assessment and Plan: * Bacteremia due to Streptococcus Streptococcus gallolyticus growing out of initial blood cultures.  Continue IV Rocephin .  Will need TEE to determine length of therapy.  Repeat blood cultures drawn on 12/31 negative so far.  Acute respiratory failure (HCC) Continue to wean oxygen .  Continue bronchodilators.  On IV Rocephin .  COPD with acute exacerbation (HCC) Continue bronchodilators, IV Rocephin , prednisone   CKD (chronic kidney disease), stage IV (HCC) Last creatinine 3.91 with a GFR 15  Acute on chronic combined systolic and diastolic CHF (congestive heart failure) (HCC) Last EF 50% with moderate mitral regurgitation.  Diuretics on hold.  On Toprol .  Dyslipidemia Continue with Zetia  and outpatient Repatha.  Essential hypertension Patient on Toprol -XL  Type 2 diabetes mellitus with chronic kidney disease, without long-term current use of insulin  (HCC) Last hemoglobin A1c a few months ago 5.5  GERD without esophagitis continue Carafate  and PPI therapy.  Anxiety and depression Continue BuSpar  and citalopram .  Gout Continue his Uloric .  Holding colchicine   BPH (benign prostatic hyperplasia) continue his Flomax .        Subjective: Patient feels okay.  Little upset that he felt like  we are not doing anything.  I explained that he is on a once a day antibiotics to treat infection in the bloodstream.  We need to get a TEE in order to determine the length of therapy of IV antibiotics.  Physical Exam: Vitals:   04/24/23 2052 04/25/23 0335 04/25/23 0739 04/25/23 0746  BP: (!) 137/119 (!) 166/72  (!) 165/67  Pulse: 74 73  66  Resp: 18 18  18   Temp: 97.8 F (36.6 C) (!) 97.5 F (36.4 C)  97.8 F (36.6 C)  TempSrc:    Oral  SpO2: 95% 92% 95% 99%  Weight: 89.3 kg     Height: 5' 3 (1.6 m)      Physical Exam HENT:     Head: Normocephalic.     Mouth/Throat:     Pharynx: No oropharyngeal exudate.  Eyes:     General: Lids are normal.     Conjunctiva/sclera: Conjunctivae normal.  Cardiovascular:     Rate and Rhythm: Normal rate and regular rhythm.     Heart sounds: Normal heart sounds, S1 normal and S2 normal.  Pulmonary:     Breath sounds: Examination of the right-lower field reveals decreased breath sounds. Examination of the left-lower field reveals decreased breath sounds. Decreased breath sounds present. No wheezing, rhonchi or rales.  Abdominal:     Palpations: Abdomen is soft.     Tenderness: There is no abdominal tenderness.  Musculoskeletal:     Right lower leg: Swelling present.     Left lower leg: No swelling.  Skin:    General: Skin is warm.     Findings: No rash.  Neurological:     Mental Status: He is alert and oriented  to person, place, and time.     Data Reviewed: Streptococcus gallolyticus growing out of blood cultures on 12/28.  Repeat blood cultures drawn yesterday is no growth less than 24 hours. Echocardiogram showing mitral regurgitation moderate and EF normal. Chronic kidney disease creatinine 3.91 with a GFR 15, lactic acid previously 2.3, hemoglobin 9.7, platelet count 146  Family Communication: Spoke with wife on the phone  Disposition: Status is: Inpatient Remains inpatient appropriate because: Will need TEE to determine the  length of therapy of IV antibiotic.  Planned Discharge Destination: Home with Home Health    Time spent: 28 minutes  Author: Charlie Patterson, MD 04/25/2023 3:59 PM  For on call review [email or web address].

## 2023-04-25 NOTE — Assessment & Plan Note (Addendum)
 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  Continue IV Rocephin  while here and switch over to amoxicillin  upon discharge for 8 days.  TEE negative.  Repeat blood cultures drawn on 12/31 negative so far.  As per ID, repeat blood cultures after antibiotic course in 2 weeks and in 4 weeks.  Streptococcus gallolyticus can come from a colon cancer and Dr. Aundria did a colonoscopy on 1/4 and removed polyps and took biopsies.  No mass seen on colonoscopy.

## 2023-04-26 ENCOUNTER — Other Ambulatory Visit: Payer: Self-pay | Admitting: *Deleted

## 2023-04-26 ENCOUNTER — Inpatient Hospital Stay: Payer: Medicare HMO | Admitting: Anesthesiology

## 2023-04-26 ENCOUNTER — Encounter
Admission: EM | Disposition: A | Discharge status: Home / Self Care | Payer: Self-pay | Source: Home / Self Care | Attending: Internal Medicine

## 2023-04-26 ENCOUNTER — Inpatient Hospital Stay
Admit: 2023-04-26 | Discharge: 2023-04-26 | Disposition: A | Discharge status: Home / Self Care | Payer: Medicare HMO | Attending: Cardiovascular Disease | Admitting: Cardiovascular Disease

## 2023-04-26 DIAGNOSIS — R7881 Bacteremia: Secondary | ICD-10-CM | POA: Diagnosis not present

## 2023-04-26 DIAGNOSIS — I38 Endocarditis, valve unspecified: Secondary | ICD-10-CM

## 2023-04-26 DIAGNOSIS — B954 Other streptococcus as the cause of diseases classified elsewhere: Secondary | ICD-10-CM

## 2023-04-26 DIAGNOSIS — I088 Other rheumatic multiple valve diseases: Secondary | ICD-10-CM

## 2023-04-26 DIAGNOSIS — I509 Heart failure, unspecified: Secondary | ICD-10-CM

## 2023-04-26 DIAGNOSIS — J9601 Acute respiratory failure with hypoxia: Secondary | ICD-10-CM | POA: Diagnosis not present

## 2023-04-26 DIAGNOSIS — I517 Cardiomegaly: Secondary | ICD-10-CM

## 2023-04-26 DIAGNOSIS — J441 Chronic obstructive pulmonary disease with (acute) exacerbation: Secondary | ICD-10-CM | POA: Diagnosis not present

## 2023-04-26 DIAGNOSIS — B955 Unspecified streptococcus as the cause of diseases classified elsewhere: Secondary | ICD-10-CM

## 2023-04-26 DIAGNOSIS — J9621 Acute and chronic respiratory failure with hypoxia: Secondary | ICD-10-CM

## 2023-04-26 DIAGNOSIS — N184 Chronic kidney disease, stage 4 (severe): Secondary | ICD-10-CM | POA: Diagnosis not present

## 2023-04-26 HISTORY — PX: TEE WITHOUT CARDIOVERSION: SHX5443

## 2023-04-26 LAB — GLUCOSE, CAPILLARY
Glucose-Capillary: 116 mg/dL — ABNORMAL HIGH (ref 70–99)
Glucose-Capillary: 100 mg/dL — ABNORMAL HIGH (ref 70–99)
Glucose-Capillary: 131 mg/dL — ABNORMAL HIGH (ref 70–99)
Glucose-Capillary: 92 mg/dL (ref 70–99)
Glucose-Capillary: 242 mg/dL — ABNORMAL HIGH (ref 70–99)

## 2023-04-26 LAB — ECHO TEE

## 2023-04-26 SURGERY — TRANSESOPHAGEAL ECHOCARDIOGRAM (TEE)
Anesthesia: General | Laterality: Right

## 2023-04-26 MED ORDER — PREDNISONE 20 MG PO TABS
40.0000 mg | ORAL_TABLET | Freq: Every day | ORAL | Status: DC
Start: 2023-04-26 — End: 2023-04-27
  Administered 2023-04-26 – 2023-04-27 (×2): 40 mg via ORAL
  Filled 2023-04-26 (×2): qty 2

## 2023-04-26 MED ORDER — PROPOFOL 10 MG/ML IV BOLUS
INTRAVENOUS | Status: AC
  Filled 2023-04-26: qty 40

## 2023-04-26 MED ORDER — SODIUM CHLORIDE 0.9 % IV SOLN: INTRAVENOUS | Status: DC | PRN

## 2023-04-26 MED ORDER — BUTAMBEN-TETRACAINE-BENZOCAINE 2-2-14 % EX AERO
INHALATION_SPRAY | CUTANEOUS | Status: AC
  Filled 2023-04-26: qty 5

## 2023-04-26 MED ORDER — LIDOCAINE HCL (PF) 1 % IJ SOLN
INTRAMUSCULAR | Status: DC | PRN
  Administered 2023-04-26: 30 mg

## 2023-04-26 MED ORDER — LIDOCAINE VISCOUS HCL 2 % MT SOLN
OROMUCOSAL | Status: AC
  Filled 2023-04-26: qty 15

## 2023-04-26 MED ORDER — PROPOFOL 10 MG/ML IV BOLUS
INTRAVENOUS | Status: DC | PRN
Start: 2023-04-26 — End: 2023-04-26
  Administered 2023-04-26: 20 mg via INTRAVENOUS
  Administered 2023-04-26: 30 mg via INTRAVENOUS
  Administered 2023-04-26: 20 mg via INTRAVENOUS

## 2023-04-26 NOTE — Evaluation (Signed)
 Physical Therapy Evaluation Patient Details Name: [name] [name] MRN: [id number] DOB: 06/10/1945 Today's Date: 04/26/2023  History of Present Illness  Pt admitted for bacteremia and is s/p TEE this date. Cleared to participate via RN. History includes CAD, COPD, gout, CHF, HTN, and CKD stage 4. Chronic baseline 3L of O2.  Clinical Impression  Pt is a pleasant 78 year old male who was admitted for bacteremia.  Pt demonstrates all bed mobility/transfers/ambulation at baseline level. Pt does not require any further PT needs at this time. Pt will be dc in house and does not require follow up. RN aware. Discussed O2 sats with patient and encouraged to use O2 at home with exertion. Pt not symptomatic on first lap, however did have slight SOB symptoms on 2nd lap. Will dc current orders.       If plan is discharge home, recommend the following:     Can travel by private vehicle        Equipment Recommendations None recommended by PT  Recommendations for Other Services       Functional Status Assessment Patient has not had a recent decline in their functional status     Precautions / Restrictions Precautions Precautions: Fall Restrictions Weight Bearing Restrictions Per Provider Order: No      Mobility  Bed Mobility               General bed mobility comments: NT, received seated on EOB    Transfers Overall transfer level: Independent Equipment used: None               General transfer comment: safe technique without AD    Ambulation/Gait Ambulation/Gait assistance: Independent Gait Distance (Feet): 300 Feet Assistive device: None Gait Pattern/deviations: WFL(Within Functional Limits)       General Gait Details: ambulated 2 laps around RN station without O2 per patient request. Sats decreased to 83% on RA. Pt still refused to don O2, however did reapply once returned to bed with quick improvement to 90%.  Stairs            Wheelchair  Mobility     Tilt Bed    Modified Rankin (Stroke Patients Only)       Balance Overall balance assessment: Independent                                           Pertinent Vitals/Pain Pain Assessment Pain Assessment: No/denies pain    Home Living Family/patient expects to be discharged to:: Private residence Living Arrangements: Spouse/significant other Available Help at Discharge: Family Type of Home: House Home Access: Level entry       Home Layout: One level Home Equipment: None      Prior Function Prior Level of Function : Independent/Modified Independent;Driving             Mobility Comments: indep prior       Extremity/Trunk Assessment   Upper Extremity Assessment Upper Extremity Assessment: Overall WFL for tasks assessed    Lower Extremity Assessment Lower Extremity Assessment: Overall WFL for tasks assessed       Communication   Communication Communication: No apparent difficulties  Cognition Arousal: Alert Behavior During Therapy: WFL for tasks assessed/performed Overall Cognitive Status: Within Functional Limits for tasks assessed  General Comments: very pleasant and agreeable to session        General Comments      Exercises     Assessment/Plan    PT Assessment Patient does not need any further PT services  PT Problem List         PT Treatment Interventions      PT Goals (Current goals can be found in the Care Plan section)  Acute Rehab PT Goals Patient Stated Goal: to go home PT Goal Formulation: All assessment and education complete, DC therapy Time For Goal Achievement: 04/26/23 Potential to Achieve Goals: Good    Frequency       Co-evaluation               AM-PAC PT 6 Clicks Mobility  Outcome Measure Help needed turning from your back to your side while in a flat bed without using bedrails?: None Help needed moving from lying on your back  to sitting on the side of a flat bed without using bedrails?: None Help needed moving to and from a bed to a chair (including a wheelchair)?: None Help needed standing up from a chair using your arms (e.g., wheelchair or bedside chair)?: None Help needed to walk in hospital room?: None Help needed climbing 3-5 steps with a railing? : None 6 Click Score: 24    End of Session Equipment Utilized During Treatment: Oxygen  Activity Tolerance: Patient tolerated treatment well Patient left: in bed (seated at EOB) Nurse Communication: Mobility status PT Visit Diagnosis: Difficulty in walking, not elsewhere classified (R26.2)    Time: 8567-8554 PT Time Calculation (min) (ACUTE ONLY): 13 min   Charges:   PT Evaluation $PT Eval Low Complexity: 1 Low   PT General Charges $$ ACUTE PT VISIT: 1 Visit         Corean Dade, PT, DPT, [facility] [phone]   Ellison Rieth 04/26/2023, 5:05 PM

## 2023-04-26 NOTE — Transfer of Care (Signed)
 Immediate Anesthesia Transfer of Care Note  Patient: [name] [name]  Procedure(s) Performed: TRANSESOPHAGEAL ECHOCARDIOGRAM (TEE) (Right)  Patient Location: PACU and [facility]  Anesthesia Type:MAC  Level of Consciousness: awake  Airway & Oxygen  Therapy: Patient Spontanous Breathing and Patient connected to face mask oxygen   Post-op Assessment: Report given to RN and Post -op Vital signs reviewed and stable  Post vital signs: Reviewed and stable  Last Vitals:  Vitals Value Taken Time  BP 144/48   Temp    Pulse 58   Resp 16   SpO2 99     Last Pain:  Vitals:   04/26/23 0721  TempSrc: Oral  PainSc: 0-No pain         Complications: No notable events documented.

## 2023-04-26 NOTE — Progress Notes (Signed)
*  PRELIMINARY RESULTS* Echocardiogram Echocardiogram Transesophageal has been performed.  [name] [name] 04/26/2023, 8:27 AM

## 2023-04-26 NOTE — Anesthesia Preprocedure Evaluation (Signed)
 Anesthesia Evaluation  Patient identified by MRN, date of birth, ID band Patient awake    Reviewed: Allergy & Precautions, H&P , NPO status , Patient's Chart, lab work & pertinent test results, reviewed documented beta blocker date and time   Airway Mallampati: IV  TM Distance: <3 FB Neck ROM: Full    Dental  (+) Partial Upper, Chipped   Pulmonary sleep apnea and Continuous Positive Airway Pressure Ventilation , pneumonia, COPD,  oxygen  dependent, former smoker sleep apnea , pneumonia, COPD,  oxygen  dependent, former smoker  reports that he quit smoking about 14 years ago. His smoking use included cigarettes. He has a 108.00 pack-year smoking history. He quit smokeless tobacco use about 18 years ago.  His smokeless tobacco use included chew.   09-06-22 currently on his 3 L of supplementary oxygen  by nasal cannula 24/7       + decreased breath sounds      Cardiovascular Exercise Tolerance: Poor METS: < 3 Mets hypertension, Pt. on home beta blockers and Pt. on medications + CAD, +CHF and + DOE  Normal cardiovascular exam+ Valvular Problems/Murmurs  Rhythm:Regular Rate:Normal - Systolic murmurs 95-93-77  (but now EF is 38% in 2024)   1. In 2022, the  Left ventricular ejection fraction, by estimation, is 50 to 55%. (Has dropped to 38% in 2024)    The left ventricle has low normal function. The left ventricle demonstrates  global hypokinesis. There is mild left ventricular hypertrophy. Left  ventricular diastolic parameters are  consistent with Grade I diastolic dysfunction (impaired relaxation).   2. Right ventricular systolic function is normal. The right ventricular  size is normal.   3. Left atrial size was mildly dilated.   4. Right atrial size was mildly dilated.   5. The mitral valve was not well visualized. Trivial mitral valve  regurgitation. Moderate mitral annular calcification.   6. The aortic valve was not well  visualized. Aortic valve regurgitation  is trivial.    09-05-22            Coronary artery disease - Primary                          Stress test 08/2022 Ischemia in the RCA territory with borderline LVEF dysfunction, advise CCTA. Kidney function decreased, unable to do CCTA. Patient denies chest pain. Will treat medically.                              echo 08/12/22 EF 38%. Patient complaining of worsening shortness of breath. Unable to change losartan  to Entresto  due to decreased kidney function.    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, did not see that in epic         Neuro/Psych  Headaches PSYCHIATRIC DISORDERS Anxiety Depression    negative neurological ROS     GI/Hepatic Neg liver ROS, hiatal hernia,GERD  ,,  Endo/Other  diabetesHypothyroidism    Renal/GU Renal disease  negative genitourinary   Musculoskeletal negative musculoskeletal ROS (+) Arthritis ,    Abdominal  (+) + obese  Peds negative pediatric ROS (+)  Hematology negative hematology ROS (+)   Anesthesia Other Findings T2DM (type 2 diabetes mellitus) (HCC) Hypertension Kidney stones  COPD  Basal cell carcinoma  Squamous cell carcinoma of skin Pneumonia  Heart murmur OSA on CPAP DOE (dyspnea on exertion) History of hiatal hernia  Migraines Arthritis  Hyperkalemia  CAD (coronary artery disease) Aortic stenosis, 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)  CKD (chronic kidney disease), stage IV  HLD (hyperlipidemia)  HFrEF (heart failure with reduced ejection fraction) (HCC) Supplemental oxygen  dependent GERD (gastroesophageal reflux disease) BCC (basal cell carcinoma of skin) Squamous cell carcinoma of skin Respiratory failure (HCC)  Acute renal failure (ARF) (HCC) CAP (community acquired pneumonia) AKI (acute kidney injury) (HCC) DM (diabetes mellitus), type 2 (HCC) Accidental cut, puncture, perforation, or hemorrhage during heart  catheterization Acute postoperative pain  ANA positive Benign hypertensive kidney disease with chronic kidney disease  Complete tear of right rotator cuff Complex tear of medial meniscus of left knee as current injury  Personal history of other malignant neoplasm of skin Recurrent nephrolithiasis  Rotator cuff tendinitis, right S/P TAVR (transcatheter aortic valve replacement)  Complex tear of lateral meniscus of left knee as current injury Grade I diastolic dysfunction  Left atrial dilation Right atrial dilation  Gout Hypothyroidism  Supplemental oxygen  dependent    Reproductive/Obstetrics negative OB ROS                             Anesthesia Physical Anesthesia Plan  ASA: 4  Anesthesia Plan: General   Post-op Pain Management: Minimal or no pain anticipated   Induction: Intravenous  PONV Risk Score and Plan: 2 and Propofol  infusion, TIVA, Ondansetron  and Treatment may vary due to age or medical condition  Airway Management Planned: Natural Airway and Simple Face Mask  Additional Equipment: None  Intra-op Plan:   Post-operative Plan:   Informed Consent: I have reviewed the patients History and Physical, chart, labs and discussed the procedure including the risks, benefits and alternatives for the proposed anesthesia with the patient or authorized representative who has indicated his/her understanding and acceptance.     Dental Advisory Given  Plan Discussed with: Anesthesiologist, CRNA and Surgeon  Anesthesia Plan Comments: (Discussed risks of anesthesia with patient, including possibility of difficulty with spontaneous ventilation under anesthesia necessitating airway intervention, PONV, and rare risks such as cardiac or respiratory or neurological events, and allergic reactions. Discussed the role of CRNA in patient's perioperative care. Patient understands. Patient counseled on being higher risk for anesthesia due to comorbidities:  HFrEF, O2-dependent COPD. Patient was told about increased risk of cardiac and respiratory events, including death. )        Anesthesia Quick Evaluation

## 2023-04-26 NOTE — Progress Notes (Signed)
 Date of Admission:  04/21/2023     ID: Taner Rzepka is a 78 y.o. male  Principal Problem:   Bacteremia due to Streptococcus Active Problems:   COPD with acute exacerbation (HCC)   CKD (chronic kidney disease), stage IV (HCC)   Essential hypertension   BPH (benign prostatic hyperplasia)   Acute respiratory failure (HCC)   Gout   Dyslipidemia   Anxiety and depression   GERD without esophagitis   Type 2 diabetes mellitus with chronic kidney disease, without long-term current use of insulin  (HCC)   Acute on chronic combined systolic and diastolic CHF (congestive heart failure) (HCC)  Tarek Cravens is a 78 y.o. with a history of CKD, gout, DM, CAD, HFrEF, hypothyroidism, copd, HTN, CVA, TAVR Presented to the ED on 04/21/23 with acute shortness of breath. He was having chills and had to use 2 blankets. EMS documented temp of 100.6.  Spoke to his wife and she said this was acute presentation  Subjective: Feeling much better ambulated  Medications:   vitamin C   1,000 mg Oral QHS   aspirin  EC  81 mg Oral Daily   calcitRIOL   0.25 mcg Oral Daily   citalopram   40 mg Oral Daily   docusate sodium   200 mg Oral BID   enoxaparin  (LOVENOX ) injection  30 mg Subcutaneous Q24H   ezetimibe   10 mg Oral QHS   febuxostat   40 mg Oral Daily   gabapentin   200 mg Oral Daily   gabapentin   400 mg Oral QHS   guaiFENesin   600 mg Oral BID   insulin  aspart  0-15 Units Subcutaneous TID WC   insulin  aspart  0-5 Units Subcutaneous QHS   metoprolol  succinate  50 mg Oral Daily   multivitamin  1 tablet Oral BID   pantoprazole   40 mg Oral Daily   predniSONE   40 mg Oral Q breakfast   tamsulosin   0.4 mg Oral Daily    Objective: Vital signs in last 24 hours: Patient Vitals for the past 24 hrs:  BP Temp Temp src Pulse Resp SpO2  04/26/23 2047 (!) 183/76 97.7 F (36.5 C) -- 63 20 100 %  04/26/23 1714 (!) 179/73 97.7 F (36.5 C) Oral 70 18 95 %  04/26/23 0923 (!) 176/71 97.7 F (36.5  C) Oral 65 18 97 %  04/26/23 0907 (!) 166/60 -- -- 65 (!) 23 96 %  04/26/23 0845 (!) 156/69 -- -- 66 18 97 %  04/26/23 0830 (!) 156/48 -- -- (!) 56 14 99 %  04/26/23 0825 (!) 151/48 -- -- (!) 56 13 99 %  04/26/23 0721 (!) 177/79 97.6 F (36.4 C) Oral 72 20 97 %  04/26/23 0528 (!) 173/64 97.8 F (36.6 C) -- 70 17 98 %     PHYSICAL EXAM:  General: Alert, cooperative, no distress, appears stated age.  .Lungs: b/l air entry  Heart: s1s2. Abdomen: Soft, non-tender,not distended. Bowel sounds normal. No masses Extremities: atraumatic, no cyanosis. No edema. No clubbing Skin: No rashes or lesions. Or bruising Lymph: Cervical, supraclavicular normal. Neurologic: Grossly non-focal  Lab Results    Latest Ref Rng & Units 04/25/2023    3:32 AM 04/24/2023    1:04 AM 04/23/2023    4:57 AM  CBC  WBC 4.0 - 10.5 K/uL 9.7  9.6  8.8   Hemoglobin 13.0 - 17.0 g/dL 9.7  9.1  9.6   Hematocrit 39.0 - 52.0 % 28.8  27.0  29.1   Platelets 150 - 400  K/uL 146  148  142        Latest Ref Rng & Units 04/25/2023    3:32 AM 04/24/2023    1:04 AM 04/23/2023    4:57 AM  CMP  Glucose 70 - 99 mg/dL 897  858  842   BUN 8 - 23 mg/dL 92  97  97   Creatinine 0.61 - 1.24 mg/dL 6.08  6.14  6.00   Sodium 135 - 145 mmol/L 139  136  138   Potassium 3.5 - 5.1 mmol/L 4.4  4.2  4.1   Chloride 98 - 111 mmol/L 105  102  101   CO2 22 - 32 mmol/L 22  21  23    Calcium 8.9 - 10.3 mg/dL 8.1  7.9  8.2       Microbiology: BC= Streptococcus gallolyticus 04/21/23 04/24/23 BC- NG Studies/Results: ECHO TEE Result Date: 04/26/2023    TRANSESOPHOGEAL ECHO REPORT   Patient Name:   YOHANN CURL Date of Exam: 04/26/2023 Medical Rec #:  [id number]               Height:       63.0 in Accession #:    [id number]              Weight:       196.9 lb Date of Birth:  April 27, 1945              BSA:          1.921 m Patient Age:    77 years                BP:           177/79 mmHg Patient Gender: M                       HR:            72 bpm. Exam Location:  [facility] Procedure: Transesophageal Echo, Cardiac Doppler and Color Doppler Indications:     Endocarditis  History:         Patient has prior history of Echocardiogram examinations, most                  recent 04/24/2023. COPD; Risk Factors:Diabetes.                  Aortic Valve: valve is present in the aortic position.                  Procedure Date: 5 years ago.  Sonographer:     Christopher Furnace Referring Phys:  [id number] [facility] A KHAN Diagnosing Phys: Denyse Bathe PROCEDURE: The transesophogeal probe was passed without difficulty through the esophogus of the patient. Sedation performed by different physician. The patient developed no complications during the procedure.  IMPRESSIONS  1. Left ventricular ejection fraction, by estimation, is 40 to 45%. The left ventricle has mildly decreased function. The left ventricle demonstrates global hypokinesis. The left ventricular internal cavity size was mildly dilated. There is mild left ventricular hypertrophy.  2. Right ventricular systolic function is mildly reduced. The right ventricular size is mildly enlarged. Mildly increased right ventricular wall thickness.  3. Smoke present. Left atrial size was moderately dilated. No left atrial/left atrial appendage thrombus was detected.  4. Right atrial size was moderately dilated.  5. The mitral valve is normal in structure. Mild to moderate mitral valve regurgitation.  6. Tricuspid valve regurgitation is mild to moderate.  7.  The aortic valve has been repaired/replaced. Aortic valve regurgitation is mild. Aortic valve sclerosis/calcification is present, without any evidence of aortic stenosis. There is a valve present in the aortic position. Procedure Date: 5 years ago. Conclusion(s)/Recommendation(s): No evidence of vegetation/infective endocarditis on this transesophageael echocardiogram. FINDINGS  Left Ventricle: Left ventricular ejection fraction, by estimation, is 40 to 45%. The left ventricle has mildly  decreased function. The left ventricle demonstrates global hypokinesis. The left ventricular internal cavity size was mildly dilated. There is  mild left ventricular hypertrophy. Right Ventricle: The right ventricular size is mildly enlarged. Mildly increased right ventricular wall thickness. Right ventricular systolic function is mildly reduced. Left Atrium: Smoke present. Left atrial size was moderately dilated. Spontaneous echo contrast was present. No left atrial/left atrial appendage thrombus was detected. Right Atrium: Right atrial size was moderately dilated. Pericardium: There is no evidence of pericardial effusion. Mitral Valve: The mitral valve is normal in structure. Mild to moderate mitral valve regurgitation. Tricuspid Valve: The tricuspid valve is normal in structure. Tricuspid valve regurgitation is mild to moderate. Aortic Valve: The aortic valve has been repaired/replaced. Aortic valve regurgitation is mild. Aortic valve sclerosis/calcification is present, without any evidence of aortic stenosis. There is a valve present in the aortic position. Procedure Date: 5 years ago. Pulmonic Valve: The pulmonic valve was grossly normal. Pulmonic valve regurgitation is mild to moderate. Aorta: The aortic root, ascending aorta and aortic arch are all structurally normal, with no evidence of dilitation or obstruction. IAS/Shunts: No atrial level shunt detected by color flow Doppler. [employer] Electronically signed by Denyse Bathe Signature Date/Time: 04/26/2023/9:57:04 AM    Final      Assessment/Plan: Streptococcus gallolyticus bacteremia - with TAVR- concern for endocarditis especially with SOB, but TEE was fine with no evidence of endocarditis This bacteria is very frequently associated with colon cancer and this patient will need colonoscopy In July 2024 he saw GI but colonoscopy was deferred due to his age I discussed with patient and wife regarding colonoscopy May see GI as OP to get  one  Currently on  ceftriaxone - day 5 The question is how long to treat with antibiotics If this is transient bacteremia from a GI source ( likely) then can do 14 days of antibiotic- a week of IV followed by PO antibiotic and then check a blood culture 2 weeks after Antibiotic    CHF   CAD   GOUT- recently took a course of prednisone    on Febuxostat  Discussed the management with the patient and wife and Dr.Wieting  I Will have to discuss with them again tomorrow to make sure they understand the significance of getting colonoscopy

## 2023-04-26 NOTE — Progress Notes (Signed)
 Progress Note   Patient: [name] [name] FMW:[id number] DOB: 05/28/1945 DOA: 04/21/2023     4 DOS: the patient was seen and examined on 04/26/2023   Brief hospital course: 78 year old man past medical history of congestive heart failure, CAD, GERD, gout, chronic oxygen .  Dyslipidemia, obstructive sleep apnea, stage IV chronic kidney disease presented to the emergency room with worsening shortness of breath, fever.  Patient was found to have Streptococcus gallolyticus and blood cultures.  Echocardiogram does not show any endocarditis but its moderate mitral regurgitation.  1/1.  Continue IV Rocephin .  TEE will need to be done to determine length of treatment. 1/2.  TEE negative for endocarditis.  Plan for potential discharge tomorrow after IV Rocephin  dose.  Will be switched over to amoxicillin .  Can consider outpatient colonoscopy.  Will need repeat blood cultures 2 weeks and 4 weeks after.  Assessment and Plan: * Bacteremia due to Streptococcus Streptococcus gallolyticus growing out of initial blood cultures.  Continue IV Rocephin  tomorrow a.m. and then switch over to amoxicillin  after that.  TEE negative.  Repeat blood cultures drawn on 12/31 negative so far.  As per ID, repeat blood cultures after antibiotic course in 2 weeks and in 4 weeks  Acute respiratory failure (HCC) Continue to wean oxygen .  Continue bronchodilators.  On IV Rocephin .  COPD with acute exacerbation (HCC) Continue bronchodilators, IV Rocephin , prednisone   CKD (chronic kidney disease), stage IV (HCC) Last creatinine 3.91 with a GFR 15  Acute on chronic combined systolic and diastolic CHF (congestive heart failure) (HCC) Last EF 50% with moderate mitral regurgitation.  Diuretics on hold.  On Toprol .  Dyslipidemia Continue with Zetia  and outpatient Repatha.  Essential hypertension Patient on Toprol -XL  Type 2 diabetes mellitus with chronic kidney disease, without long-term current use of insulin   (HCC) Last hemoglobin A1c a few months ago 5.5  GERD without esophagitis continue Carafate  and PPI therapy.  Anxiety and depression Continue BuSpar  and citalopram .  Gout Continue his Uloric .  Holding colchicine   BPH (benign prostatic hyperplasia) continue his Flomax .        Subjective: Patient feels okay.  States his toe was bothering him a little bit but not that bad.  He has a history of gout.  Breathing okay.  Admitted with sepsis.  Physical Exam: Vitals:   04/26/23 0830 04/26/23 0845 04/26/23 0907 04/26/23 0923  BP: (!) 156/48 (!) 156/69 (!) 166/60 (!) 176/71  Pulse: (!) 56 66 65 65  Resp: 14 18 (!) 23 18  Temp:    97.7 F (36.5 C)  TempSrc:    Oral  SpO2: 99% 97% 96% 97%  Weight:      Height:       Physical Exam HENT:     Head: Normocephalic.     Mouth/Throat:     Pharynx: No oropharyngeal exudate.  Eyes:     General: Lids are normal.     Conjunctiva/sclera: Conjunctivae normal.  Cardiovascular:     Rate and Rhythm: Normal rate and regular rhythm.     Heart sounds: Normal heart sounds, S1 normal and S2 normal.  Pulmonary:     Breath sounds: Examination of the right-lower field reveals decreased breath sounds and rhonchi. Examination of the left-lower field reveals decreased breath sounds and rhonchi. Decreased breath sounds and rhonchi present. No wheezing or rales.  Abdominal:     Palpations: Abdomen is soft.     Tenderness: There is no abdominal tenderness.  Musculoskeletal:     Right lower leg: Swelling present.  Left lower leg: No swelling.  Skin:    General: Skin is warm.     Findings: No rash.  Neurological:     Mental Status: He is alert and oriented to person, place, and time.     Data Reviewed: Creatinine 3.91 with a GFR 15, hemoglobin 9.7, platelet count 146 Family Communication: Spoke with wife on the phone  Disposition: Status is: Inpatient Remains inpatient appropriate because: Will reevaluate tomorrow after IV Rocephin .   Potential discharge tomorrow on oral amoxicillin .  Planned Discharge Destination: Home    Time spent: 28 minutes  Author: Charlie Patterson, MD 04/26/2023 3:03 PM  For on call review [email or web address].

## 2023-04-26 NOTE — Progress Notes (Addendum)
 [facility]  ROUNDING NOTE   Subjective:   [name] [name] is a 78 year old male with past medical conditions including CAD, basal cell carcinoma, arthritis, COPD, gout, CHF, hyperlipidemia, hypertension, and chronic kidney disease stage IV.Patient presents to the emergency department complaining of shortness of breath and has been admitted for Acute respiratory failure (HCC) [J96.00] Bacteremia [R78.81] Acute respiratory failure with hypoxia (HCC) [J96.01]  Patient is known to our practice and is followed outpatient by Dr. Douglas.  Patient was last seen in office on 03/27/2023 for routine follow-up.  Patient seen sitting up in bed Alert and oriented NPO for TEE  No new labs today 4 urine occurrences recorded.    Objective:  Vital signs in last 24 hours:  Temp:  [97.5 F (36.4 C)-97.8 F (36.6 C)] 97.7 F (36.5 C) (01/02 0923) Pulse Rate:  [56-72] 65 (01/02 0923) Resp:  [13-23] 18 (01/02 0923) BP: (151-188)/(48-90) 176/71 (01/02 0923) SpO2:  [90 %-99 %] 97 % (01/02 0923)  Weight change:  Filed Weights   04/22/23 0132 04/24/23 2052  Weight: 90.3 kg 89.3 kg    Intake/Output: No intake/output data recorded.   Intake/Output this shift:  Total I/O In: 150 [I.V.:150] Out: -   Physical Exam: General: NAD, laying in bed  Head: Normocephalic, atraumatic. Moist oral mucosal membranes  Eyes: Anicteric  Lungs:  Clear to auscultation, 3L [place] O2  Heart: Regular rate and rhythm  Abdomen:  Soft, nontender  Extremities: no peripheral edema.  Neurologic: Alert and oriented, moving all four extremities  Skin: No lesions  Access: None    Basic Metabolic Panel: Recent Labs  Lab 04/21/23 2352 04/22/23 0242 04/23/23 0457 04/24/23 0104 04/25/23 0332  NA 138 138 138 136 139  K 4.7 4.5 4.1 4.2 4.4  CL 99 102 101 102 105  CO2 26 22 23  21* 22  GLUCOSE 132* 175* 157* 141* 102*  BUN 76* 79* 97* 97* 92*  CREATININE 3.49* 3.41* 3.99* 3.85* 3.91*  CALCIUM  8.5* 7.8* 8.2* 7.9* 8.1*    Liver Function Tests: Recent Labs  Lab 04/21/23 2352  AST 19  ALT 15  ALKPHOS 66  BILITOT 0.7  PROT 6.9  ALBUMIN 3.3*   No results for input(s): LIPASE, AMYLASE in the last 168 hours. No results for input(s): AMMONIA in the last 168 hours.  CBC: Recent Labs  Lab 04/21/23 2352 04/22/23 0242 04/23/23 0457 04/24/23 0104 04/25/23 0332  WBC 7.3 8.1 8.8 9.6 9.7  NEUTROABS 6.1  --   --   --   --   HGB 11.7* 9.8* 9.6* 9.1* 9.7*  HCT 36.0* 30.8* 29.1* 27.0* 28.8*  MCV 98.9 100.3* 96.7 94.4 96.6  PLT 158 137* 142* 148* 146*    Cardiac Enzymes: No results for input(s): CKTOTAL, CKMB, CKMBINDEX, TROPONINI in the last 168 hours.  BNP: Invalid input(s): POCBNP  CBG: Recent Labs  Lab 04/25/23 1601 04/25/23 2050 04/26/23 0723 04/26/23 0924 04/26/23 1148  GLUCAP 130* 148* 116* 100* 131*    Microbiology: Results for orders placed or performed during the hospital encounter of 04/21/23  Blood Culture (routine x 2)     Status: Abnormal (Preliminary result)   Collection Time: 04/21/23 11:52 PM   Specimen: Left Antecubital; Blood  Result Value Ref Range Status   Specimen Description   Final    LEFT ANTECUBITAL Performed at [facility], [address] [place], [place] [address]    Special Requests   Final    BOTTLES DRAWN AEROBIC AND  ANAEROBIC Blood Culture results may not be optimal due to an inadequate volume of blood received in culture bottles Performed at [facility], [address] [place], [place] [address]    Culture  Setup Time   Final    GRAM POSITIVE COCCI IN BOTH AEROBIC AND ANAEROBIC BOTTLES CRITICAL RESULT CALLED TO, READ BACK BY AND VERIFIED WITH: MADISON HUNT @1053  04/22/23 MJU GRAM STAIN REVIEWED-AGREE WITH RESULT    Culture (A)  Final    STREPTOCOCCUS GALLOLYTICUS SUSCEPTIBILITIES TO FOLLOW Performed at [facility], [address], [place], [place] [address]    Report Status  PENDING  Incomplete  Blood Culture ID Panel (Reflexed)     Status: Abnormal   Collection Time: 04/21/23 11:52 PM  Result Value Ref Range Status   Enterococcus faecalis NOT DETECTED NOT DETECTED Final   Enterococcus Faecium NOT DETECTED NOT DETECTED Final   Listeria monocytogenes NOT DETECTED NOT DETECTED Final   Staphylococcus species NOT DETECTED NOT DETECTED Final   Staphylococcus aureus (BCID) NOT DETECTED NOT DETECTED Final   Staphylococcus epidermidis NOT DETECTED NOT DETECTED Final   Staphylococcus lugdunensis NOT DETECTED NOT DETECTED Final   Streptococcus species DETECTED (A) NOT DETECTED Final    Comment: Not Enterococcus species, Streptococcus agalactiae, Streptococcus pyogenes, or Streptococcus pneumoniae. CRITICAL RESULT CALLED TO, READ BACK BY AND VERIFIED WITH: MADISON HUNT @1053  04/22/23 MJU    Streptococcus agalactiae NOT DETECTED NOT DETECTED Final   Streptococcus pneumoniae NOT DETECTED NOT DETECTED Final   Streptococcus pyogenes NOT DETECTED NOT DETECTED Final   A.calcoaceticus-baumannii NOT DETECTED NOT DETECTED Final   Bacteroides fragilis NOT DETECTED NOT DETECTED Final   Enterobacterales NOT DETECTED NOT DETECTED Final   Enterobacter cloacae complex NOT DETECTED NOT DETECTED Final   Escherichia coli NOT DETECTED NOT DETECTED Final   Klebsiella aerogenes NOT DETECTED NOT DETECTED Final   Klebsiella oxytoca NOT DETECTED NOT DETECTED Final   Klebsiella pneumoniae NOT DETECTED NOT DETECTED Final   Proteus species NOT DETECTED NOT DETECTED Final   Salmonella species NOT DETECTED NOT DETECTED Final   Serratia marcescens NOT DETECTED NOT DETECTED Final   Haemophilus influenzae NOT DETECTED NOT DETECTED Final   Neisseria meningitidis NOT DETECTED NOT DETECTED Final   Pseudomonas aeruginosa NOT DETECTED NOT DETECTED Final   Stenotrophomonas maltophilia NOT DETECTED NOT DETECTED Final   Candida albicans NOT DETECTED NOT DETECTED Final   Candida auris NOT DETECTED NOT  DETECTED Final   Candida glabrata NOT DETECTED NOT DETECTED Final   Candida krusei NOT DETECTED NOT DETECTED Final   Candida parapsilosis NOT DETECTED NOT DETECTED Final   Candida tropicalis NOT DETECTED NOT DETECTED Final   Cryptococcus neoformans/gattii NOT DETECTED NOT DETECTED Final    Comment: Performed at [facility], [address] [place], [place] [address]  Resp panel by RT-PCR (RSV, Flu A&B, Covid) Anterior Nasal Swab     Status: None   Collection Time: 04/21/23 11:56 PM   Specimen: Anterior Nasal Swab  Result Value Ref Range Status   SARS Coronavirus 2 by RT PCR NEGATIVE NEGATIVE Final    Comment: (NOTE) SARS-CoV-2 target nucleic acids are NOT DETECTED.  The SARS-CoV-2 RNA is generally detectable in upper respiratory specimens during the acute phase of infection. The lowest concentration of SARS-CoV-2 viral copies this assay can detect is 138 copies/mL. A negative result does not preclude SARS-Cov-2 infection and should not be used as the sole basis for treatment or other patient management decisions. A negative result may occur with  improper specimen collection/handling, submission of specimen other than nasopharyngeal swab, presence of viral mutation(s) within the areas targeted by this assay, and inadequate number of viral copies(<138 copies/mL). A negative result must be combined with clinical observations, patient history, and epidemiological information. The expected result is Negative.  Fact Sheet for Patients:  bloggercourse.com  Fact Sheet for Healthcare Providers:  seriousbroker.it  This test is no t yet approved or cleared by the [place]  FDA and  has been authorized for detection and/or diagnosis of SARS-CoV-2 by FDA under an Emergency Use Authorization (EUA). This EUA will remain  in effect (meaning this test can be used) for the duration of the COVID-19 declaration under Section 564(b)(1)  of the Act, 21 U.S.C.section 360bbb-3(b)(1), unless the authorization is terminated  or revoked sooner.       Influenza A by PCR NEGATIVE NEGATIVE Final   Influenza B by PCR NEGATIVE NEGATIVE Final    Comment: (NOTE) The Xpert Xpress SARS-CoV-2/FLU/RSV plus assay is intended as an aid in the diagnosis of influenza from Nasopharyngeal swab specimens and should not be used as a sole basis for treatment. Nasal washings and aspirates are unacceptable for Xpert Xpress SARS-CoV-2/FLU/RSV testing.  Fact Sheet for Patients: bloggercourse.com  Fact Sheet for Healthcare Providers: seriousbroker.it  This test is not yet approved or cleared by the [place]  FDA and has been authorized for detection and/or diagnosis of SARS-CoV-2 by FDA under an Emergency Use Authorization (EUA). This EUA will remain in effect (meaning this test can be used) for the duration of the COVID-19 declaration under Section 564(b)(1) of the Act, 21 U.S.C. section 360bbb-3(b)(1), unless the authorization is terminated or revoked.     Resp Syncytial Virus by PCR NEGATIVE NEGATIVE Final    Comment: (NOTE) Fact Sheet for Patients: bloggercourse.com  Fact Sheet for Healthcare Providers: seriousbroker.it  This test is not yet approved or cleared by the [place]  FDA and has been authorized for detection and/or diagnosis of SARS-CoV-2 by FDA under an Emergency Use Authorization (EUA). This EUA will remain in effect (meaning this test can be used) for the duration of the COVID-19 declaration under Section 564(b)(1) of the Act, 21 U.S.C. section 360bbb-3(b)(1), unless the authorization is terminated or revoked.  Performed at [facility], [address] [place], [place] [address]   Blood Culture (routine x 2)     Status: None (Preliminary result)   Collection Time: 04/22/23  9:49 PM   Specimen:  BLOOD  Result Value Ref Range Status   Specimen Description BLOOD BLOOD LEFT ARM  Final   Special Requests   Final    BOTTLES DRAWN AEROBIC AND ANAEROBIC Blood Culture results may not be optimal due to an inadequate volume of blood received in culture bottles   Culture   Final    NO GROWTH 4 DAYS Performed at [facility] [address], [place], [place] [address]    Report Status PENDING  Incomplete  Urine Culture (for pregnant, neutropenic or urologic patients or patients with an indwelling urinary catheter)     Status: None   Collection Time: 04/23/23  9:29 AM   Specimen: Urine, Clean Catch  Result Value Ref Range Status   Specimen Description   Final    URINE, CLEAN CATCH Performed at [facility], [address], [place], [place] [address]    Special Requests   Final    NONE Performed at [facility], [address], [place], [place] [address]    Culture  Final    NO GROWTH Performed at [facility], [address] [place], [place] [address]    Report Status 04/25/2023 FINAL  Final  Culture, blood (Routine X 2) w Reflex to ID Panel     Status: None (Preliminary result)   Collection Time: 04/24/23  9:56 AM   Specimen: BLOOD LEFT ARM  Result Value Ref Range Status   Specimen Description BLOOD LEFT ARM  Final   Special Requests   Final    BOTTLES DRAWN AEROBIC AND ANAEROBIC Blood Culture adequate volume   Culture   Final    NO GROWTH 2 DAYS Performed at [facility], [address], [place], [place] [address]    Report Status PENDING  Incomplete  Culture, blood (Routine X 2) w Reflex to ID Panel     Status: None (Preliminary result)   Collection Time: 04/24/23  9:56 AM   Specimen: BLOOD LEFT ARM  Result Value Ref Range Status   Specimen Description BLOOD LEFT ARM  Final   Special Requests   Final    BOTTLES DRAWN AEROBIC AND ANAEROBIC Blood Culture adequate volume   Culture   Final    NO GROWTH 2 DAYS Performed at  [facility], [address], [place], [place] [address]    Report Status PENDING  Incomplete    Coagulation Studies: No results for input(s): LABPROT, INR in the last 72 hours.   Urinalysis: No results for input(s): COLORURINE, LABSPEC, PHURINE, GLUCOSEU, HGBUR, BILIRUBINUR, KETONESUR, PROTEINUR, UROBILINOGEN, NITRITE, LEUKOCYTESUR in the last 72 hours.  Invalid input(s): APPERANCEUR     Imaging: ECHO TEE Result Date: 04/26/2023    TRANSESOPHOGEAL ECHO REPORT   Patient Name:   JOHNATTAN STRASSMAN Date of Exam: 04/26/2023 Medical Rec #:  [id number]               Height:       63.0 in Accession #:    [id number]              Weight:       196.9 lb Date of Birth:  1945/05/27              BSA:          1.921 m Patient Age:    77 years                BP:           177/79 mmHg Patient Gender: M                       HR:           72 bpm. Exam Location:  [facility] Procedure: Transesophageal Echo, Cardiac Doppler and Color Doppler Indications:     Endocarditis  History:         Patient has prior history of Echocardiogram examinations, most                  recent 04/24/2023. COPD; Risk Factors:Diabetes.                  Aortic Valve: valve is present in the aortic position.                  Procedure Date: 5 years ago.  Sonographer:     Christopher Furnace Referring Phys:  [id number] [facility] A KHAN Diagnosing Phys: Denyse Bathe PROCEDURE: The transesophogeal probe was passed without difficulty through the esophogus of the patient. Sedation  performed by different physician. The patient developed no complications during the procedure.  IMPRESSIONS  1. Left ventricular ejection fraction, by estimation, is 40 to 45%. The left ventricle has mildly decreased function. The left ventricle demonstrates global hypokinesis. The left ventricular internal cavity size was mildly dilated. There is mild left ventricular hypertrophy.  2. Right ventricular systolic function is mildly reduced. The right  ventricular size is mildly enlarged. Mildly increased right ventricular wall thickness.  3. Smoke present. Left atrial size was moderately dilated. No left atrial/left atrial appendage thrombus was detected.  4. Right atrial size was moderately dilated.  5. The mitral valve is normal in structure. Mild to moderate mitral valve regurgitation.  6. Tricuspid valve regurgitation is mild to moderate.  7. The aortic valve has been repaired/replaced. Aortic valve regurgitation is mild. Aortic valve sclerosis/calcification is present, without any evidence of aortic stenosis. There is a valve present in the aortic position. Procedure Date: 5 years ago. Conclusion(s)/Recommendation(s): No evidence of vegetation/infective endocarditis on this transesophageael echocardiogram. FINDINGS  Left Ventricle: Left ventricular ejection fraction, by estimation, is 40 to 45%. The left ventricle has mildly decreased function. The left ventricle demonstrates global hypokinesis. The left ventricular internal cavity size was mildly dilated. There is  mild left ventricular hypertrophy. Right Ventricle: The right ventricular size is mildly enlarged. Mildly increased right ventricular wall thickness. Right ventricular systolic function is mildly reduced. Left Atrium: Smoke present. Left atrial size was moderately dilated. Spontaneous echo contrast was present. No left atrial/left atrial appendage thrombus was detected. Right Atrium: Right atrial size was moderately dilated. Pericardium: There is no evidence of pericardial effusion. Mitral Valve: The mitral valve is normal in structure. Mild to moderate mitral valve regurgitation. Tricuspid Valve: The tricuspid valve is normal in structure. Tricuspid valve regurgitation is mild to moderate. Aortic Valve: The aortic valve has been repaired/replaced. Aortic valve regurgitation is mild. Aortic valve sclerosis/calcification is present, without any evidence of aortic stenosis. There is a valve present  in the aortic position. Procedure Date: 5 years ago. Pulmonic Valve: The pulmonic valve was grossly normal. Pulmonic valve regurgitation is mild to moderate. Aorta: The aortic root, ascending aorta and aortic arch are all structurally normal, with no evidence of dilitation or obstruction. IAS/Shunts: No atrial level shunt detected by color flow Doppler. [employer] Electronically signed by Denyse Bathe Signature Date/Time: 04/26/2023/9:57:04 AM    Final      Medications:    cefTRIAXone  (ROCEPHIN )  IV Stopped (04/25/23 1030)    vitamin C   1,000 mg Oral QHS   aspirin  EC  81 mg Oral Daily   butamben -tetracaine -benzocaine        calcitRIOL   0.25 mcg Oral Daily   citalopram   40 mg Oral Daily   docusate sodium   200 mg Oral BID   enoxaparin  (LOVENOX ) injection  30 mg Subcutaneous Q24H   ezetimibe   10 mg Oral QHS   febuxostat   40 mg Oral Daily   gabapentin   200 mg Oral Daily   gabapentin   400 mg Oral QHS   guaiFENesin   600 mg Oral BID   insulin  aspart  0-15 Units Subcutaneous TID WC   insulin  aspart  0-5 Units Subcutaneous QHS   lidocaine        metoprolol  succinate  50 mg Oral Daily   multivitamin  1 tablet Oral BID   pantoprazole   40 mg Oral Daily   predniSONE   40 mg Oral Q breakfast   tamsulosin   0.4 mg Oral Daily   acetaminophen  **OR** acetaminophen , busPIRone ,  butamben -tetracaine -benzocaine , chlorpheniramine-HYDROcodone , ipratropium-albuterol , lidocaine , loratadine , magnesium  hydroxide, ondansetron  **OR** ondansetron  (ZOFRAN ) IV, traZODone   Assessment/ Plan:  [name] [name] is a 78 y.o.  male with past medical conditions including CAD, basal cell carcinoma, arthritis, COPD, gout, CHF, hyperlipidemia, hypertension, and chronic kidney disease stage IV.Patient presents to the emergency department complaining of shortness of breath and has been admitted for Acute respiratory failure (HCC) [J96.00] Bacteremia [R78.81] Acute respiratory failure with hypoxia (HCC)  [J96.01]  Chronic kidney disease stage IV with baseline creatinine 3.59 and GFR of 17 on 12/11/22.  Creatinine remains stable. Will continue to monitor for now. No indication for dialysis.    Lab Results  Component Value Date   CREATININE 3.91 (H) 04/25/2023   CREATININE 3.85 (H) 04/24/2023   CREATININE 3.99 (H) 04/23/2023    Intake/Output Summary (Last 24 hours) at 04/26/2023 1320 Last data filed at 04/26/2023 0813 Gross per 24 hour  Intake 150 ml  Output --  Net 150 ml    2.  Acute respiratory failure likely secondary to COPD exacerbation versus diastolic CHF exacerbation.   Diuretics remain held.    3.  Bacteremia, positive blood cultures growing strep gallolyticus.  ID consulted. TEE completed on 04/26/23, negative for vegetation.  Currently receiving ceftriaxone .   4. Anemia of chronic kidney disease Lab Results  Component Value Date   HGB 9.7 (L) 04/25/2023    Hgb acceptable for this patient  4. Hypertension: elevated readings.  - metoprolol  and tamsulosin    LOS: 4 Kasson Lamere 1/2/20251:20 PM

## 2023-04-26 NOTE — Progress Notes (Signed)
 SUBJECTIVE: Patient is feeling fine today prior to TEE   Vitals:   04/26/23 0830 04/26/23 0845 04/26/23 0907 04/26/23 0923  BP: (!) 156/48 (!) 156/69 (!) 166/60 (!) 176/71  Pulse: (!) 56 66 65 65  Resp: 14 18 (!) 23 18  Temp:    97.7 F (36.5 C)  TempSrc:    Oral  SpO2: 99% 97% 96% 97%  Weight:      Height:        Intake/Output Summary (Last 24 hours) at 04/26/2023 0957 Last data filed at 04/26/2023 0813 Gross per 24 hour  Intake 150 ml  Output --  Net 150 ml    LABS: Basic Metabolic Panel: Recent Labs    04/24/23 0104 04/25/23 0332  NA 136 139  K 4.2 4.4  CL 102 105  CO2 21* 22  GLUCOSE 141* 102*  BUN 97* 92*  CREATININE 3.85* 3.91*  CALCIUM 7.9* 8.1*   Liver Function Tests: No results for input(s): AST, ALT, ALKPHOS, BILITOT, PROT, ALBUMIN in the last 72 hours. No results for input(s): LIPASE, AMYLASE in the last 72 hours. CBC: Recent Labs    04/24/23 0104 04/25/23 0332  WBC 9.6 9.7  HGB 9.1* 9.7*  HCT 27.0* 28.8*  MCV 94.4 96.6  PLT 148* 146*   Cardiac Enzymes: No results for input(s): CKTOTAL, CKMB, CKMBINDEX, TROPONINI in the last 72 hours. BNP: Invalid input(s): POCBNP D-Dimer: No results for input(s): DDIMER in the last 72 hours. Hemoglobin A1C: No results for input(s): HGBA1C in the last 72 hours. Fasting Lipid Panel: No results for input(s): CHOL, HDL, LDLCALC, TRIG, CHOLHDL, LDLDIRECT in the last 72 hours. Thyroid  Function Tests: No results for input(s): TSH, T4TOTAL, T3FREE, THYROIDAB in the last 72 hours.  Invalid input(s): FREET3 Anemia Panel: No results for input(s): VITAMINB12, FOLATE, FERRITIN, TIBC, IRON, RETICCTPCT in the last 72 hours.   PHYSICAL EXAM General: Well developed, well nourished, in no acute distress HEENT:  Normocephalic and atramatic Neck:  No JVD.  Lungs: Clear bilaterally to auscultation and percussion. Heart: HRRR . Normal S1 and S2 without  gallops or murmurs.  Abdomen: Bowel sounds are positive, abdomen soft and non-tender  Msk:  Back normal, normal gait. Normal strength and tone for age. Extremities: No clubbing, cyanosis or edema.   Neuro: Alert and oriented X 3. Psych:  Good affect, responds appropriately  TELEMETRY: Normal sinus rhythm  ASSESSMENT AND PLAN: Status post TEE, report in the chart.  Patient had mild to moderate LV dysfunction with mild to moderate mitral regurgitation and mild to moderate tricuspid regurgitation.  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  Patient tolerated the procedure well.   ICD-10-CM   1. Acute respiratory failure with hypoxia (HCC)  J96.01       Principal Problem:   Bacteremia due to Streptococcus Active Problems:   COPD with acute exacerbation (HCC)   CKD (chronic kidney disease), stage IV (HCC)   Essential hypertension   BPH (benign prostatic hyperplasia)   Acute respiratory failure (HCC)   Gout   Dyslipidemia   Anxiety and depression   GERD without esophagitis   Type 2 diabetes mellitus with chronic kidney disease, without long-term current use of insulin  (HCC)   Acute on chronic combined systolic and diastolic CHF (congestive heart failure) (HCC)    Denyse Bathe, MD, [facility] 04/26/2023 9:57 AM

## 2023-04-26 NOTE — Consult Note (Signed)
 [facility] Liaison Note  04/26/2023  [name] [name] [facility] 05-22-1945 [id number]  Location: RN Hospital Liaison met patient at bedside at [facility].  Insurance: [facility] HMO   [name] [name] is a 78 y.o. male who is a Primary Care Patient of [place], Alan HERO, FNP  [facility]. The patient was screened for  readmission hospitalization with noted high risk score for unplanned readmission risk with 2 IP/1 ED in 6 months.  The patient was assessed for potential Care Management service needs for post hospital transition for care coordination. Review of patient's electronic medical record reveals patient was admitted with Bacteremia to Streptococcus. Liaison visited pt at bedside and educated on VBCI for post hospital prevention readmission follow up call.   Pt indicates he is aware and received such calls in the past from VBCI. Pt receptive to additional calls and possible nurse care coordinator for ongoing management of care related to his CHF history. Pt has a good support system and requested calls be placed to his spouse [name] [name]. Verified contact number via EPIC for the follow up calls. Pt states he will discharge tomorrow with no needs. No other request or inquires at this time.   Plan: [facility] Liaison will continue to follow progress and disposition to asess for post hospital community care coordination/management needs.  Referral request for community care coordination: anticipate Transitions of Care Team follow up.   VBCI Care Management/Population Health does not replace or interfere with any arrangements made by the Inpatient Transition of Care team.   For questions contact:   Olam Ku, RN, [facility] Liaison    [facility], Population Health Office Hours MTWF  8:00 am-6:00 pm Direct Dial: [phone] mobile [phone] [Office toll free line] Office Hours are M-F 8:30 - 5  pm [email or web address]

## 2023-04-26 NOTE — Anesthesia Postprocedure Evaluation (Signed)
 Anesthesia Post Note  Patient: [name] [name]  Procedure(s) Performed: TRANSESOPHAGEAL ECHOCARDIOGRAM (TEE) (Right)  Patient location during evaluation: Specials Recovery Anesthesia Type: General Level of consciousness: awake and alert Pain management: pain level controlled Vital Signs Assessment: post-procedure vital signs reviewed and stable Respiratory status: spontaneous breathing, nonlabored ventilation, respiratory function stable and patient connected to nasal cannula oxygen  Cardiovascular status: blood pressure returned to baseline and stable Postop Assessment: no apparent nausea or vomiting Anesthetic complications: no   No notable events documented.   Last Vitals:  Vitals:   04/26/23 0825 04/26/23 0830  BP: (!) 151/48 (!) 156/48  Pulse: (!) 56 (!) 56  Resp: 13 14  Temp:    SpO2: 99% 99%    Last Pain:  Vitals:   04/26/23 0830  TempSrc:   PainSc: 0-No pain                 Rome Ade

## 2023-04-27 ENCOUNTER — Encounter: Payer: Self-pay | Admitting: Cardiovascular Disease

## 2023-04-27 ENCOUNTER — Other Ambulatory Visit: Payer: Self-pay | Admitting: Infectious Diseases

## 2023-04-27 DIAGNOSIS — B954 Other streptococcus as the cause of diseases classified elsewhere: Secondary | ICD-10-CM | POA: Diagnosis not present

## 2023-04-27 DIAGNOSIS — J9621 Acute and chronic respiratory failure with hypoxia: Secondary | ICD-10-CM | POA: Diagnosis not present

## 2023-04-27 DIAGNOSIS — R7881 Bacteremia: Secondary | ICD-10-CM

## 2023-04-27 DIAGNOSIS — D509 Iron deficiency anemia, unspecified: Secondary | ICD-10-CM | POA: Insufficient documentation

## 2023-04-27 DIAGNOSIS — D508 Other iron deficiency anemias: Secondary | ICD-10-CM

## 2023-04-27 DIAGNOSIS — J441 Chronic obstructive pulmonary disease with (acute) exacerbation: Secondary | ICD-10-CM | POA: Diagnosis not present

## 2023-04-27 DIAGNOSIS — N184 Chronic kidney disease, stage 4 (severe): Secondary | ICD-10-CM | POA: Diagnosis not present

## 2023-04-27 DIAGNOSIS — E875 Hyperkalemia: Secondary | ICD-10-CM

## 2023-04-27 DIAGNOSIS — J9601 Acute respiratory failure with hypoxia: Principal | ICD-10-CM

## 2023-04-27 LAB — CULTURE, BLOOD (ROUTINE X 2): Culture: NO GROWTH

## 2023-04-27 LAB — GLUCOSE, CAPILLARY
Glucose-Capillary: 125 mg/dL — ABNORMAL HIGH (ref 70–99)
Glucose-Capillary: 59 mg/dL — ABNORMAL LOW (ref 70–99)
Glucose-Capillary: 119 mg/dL — ABNORMAL HIGH (ref 70–99)
Glucose-Capillary: 125 mg/dL — ABNORMAL HIGH (ref 70–99)

## 2023-04-27 LAB — CBC
HCT: 29.7 % — ABNORMAL LOW (ref 39.0–52.0)
Hemoglobin: 9.8 g/dL — ABNORMAL LOW (ref 13.0–17.0)
MCH: 32.2 pg (ref 26.0–34.0)
MCHC: 33 g/dL (ref 30.0–36.0)
MCV: 97.7 fL (ref 80.0–100.0)
Platelets: 126 10*3/uL — ABNORMAL LOW (ref 150–400)
RBC: 3.04 MIL/uL — ABNORMAL LOW (ref 4.22–5.81)
RDW: 13.2 % (ref 11.5–15.5)
WBC: 8 10*3/uL (ref 4.0–10.5)
nRBC: 0 % (ref 0.0–0.2)

## 2023-04-27 LAB — BASIC METABOLIC PANEL
Anion gap: 12 (ref 5–15)
BUN: 93 mg/dL — ABNORMAL HIGH (ref 8–23)
CO2: 21 mmol/L — ABNORMAL LOW (ref 22–32)
Calcium: 8.4 mg/dL — ABNORMAL LOW (ref 8.9–10.3)
Chloride: 104 mmol/L (ref 98–111)
Creatinine, Ser: 3.51 mg/dL — ABNORMAL HIGH (ref 0.61–1.24)
GFR, Estimated: 17 mL/min — ABNORMAL LOW (ref >60)
Glucose, Bld: 120 mg/dL — ABNORMAL HIGH (ref 70–99)
Potassium: 5.2 mmol/L — ABNORMAL HIGH (ref 3.5–5.1)
Sodium: 137 mmol/L (ref 135–145)

## 2023-04-27 MED ORDER — BISACODYL 5 MG PO TBEC
10.0000 mg | DELAYED_RELEASE_TABLET | Freq: Once | ORAL | Status: AC
  Administered 2023-04-27: 10 mg via ORAL
  Filled 2023-04-27: qty 2

## 2023-04-27 MED ORDER — PEG 3350-KCL-NA BICARB-NACL 420 G PO SOLR
4000.0000 mL | Freq: Once | ORAL | Status: AC
Start: 2023-04-27 — End: 2023-04-27
  Administered 2023-04-27: 4000 mL via ORAL
  Filled 2023-04-27: qty 4000

## 2023-04-27 MED ORDER — SODIUM CHLORIDE 0.9% FLUSH: 3.0000 mL | INTRAVENOUS | Status: DC | PRN

## 2023-04-27 MED ORDER — SODIUM ZIRCONIUM CYCLOSILICATE 10 G PO PACK
10.0000 g | PACK | Freq: Once | ORAL | Status: AC
Start: 2023-04-27 — End: 2023-04-27
  Administered 2023-04-27: 10 g via ORAL
  Filled 2023-04-27: qty 1

## 2023-04-27 MED ORDER — ASPIRIN 81 MG PO TBEC: 81.0000 mg | DELAYED_RELEASE_TABLET | Freq: Every day | ORAL | Status: DC

## 2023-04-27 MED ORDER — SODIUM CHLORIDE 0.9% FLUSH
3.0000 mL | Freq: Two times a day (BID) | INTRAVENOUS | Status: DC
  Administered 2023-04-27: 3 mL via INTRAVENOUS
  Administered 2023-04-28: 10 mL via INTRAVENOUS

## 2023-04-27 NOTE — Assessment & Plan Note (Addendum)
 Resolved

## 2023-04-27 NOTE — Inpatient Diabetes Management (Addendum)
 Inpatient Diabetes Program Recommendations  AACE/ADA: New Consensus Statement on Inpatient Glycemic Control (2015)  Target Ranges:  Prepandial:   less than 140 mg/dL      Peak postprandial:   less than 180 mg/dL (1-2 hours)      Critically ill patients:  140 - 180 mg/dL   Lab Results  Component Value Date   GLUCAP 59 (L) 04/27/2023   HGBA1C 5.5 03/06/2023    Review of Glycemic Control  Latest Reference Range & Units 04/26/23 09:24 04/26/23 11:48 04/26/23 17:15 04/26/23 20:47 04/27/23 07:52 04/27/23 09:15 04/27/23 11:32  Glucose-Capillary 70 - 99 mg/dL 899 (H) 868 (H) 92 757 (H)  Novolog  2 units  125 (H) Novolog  2 units  59 (L)  (H): Data is abnormally high (L): Data is abnormally low  Latest Reference Range & Units 04/27/23 04:34  GFR, Estimated >60 mL/min 17 (L)  (L): Data is abnormally low  Diabetes history: DM2 Outpatient Diabetes medications: None Current orders for Inpatient glycemic control: Novolog  0-15 units TID and 0-5 units at bedtime, Prednisone  40 mg QAM  Inpatient Diabetes Program Recommendations:    Hypoglycemia at noon today-59 mg/dL.  Please consider decreasing correction to:  Novolog  0-6 units TID  Will continue to follow while inpatient.  Thank you, Wyvonna Pinal, MSN, CDCES Diabetes Coordinator Inpatient Diabetes Program [phone] (team pager from 8a-5p)

## 2023-04-27 NOTE — Progress Notes (Signed)
 [facility]  ROUNDING NOTE   Subjective:   [name] [name] is a 78 year old male with past medical conditions including CAD, basal cell carcinoma, arthritis, COPD, gout, CHF, hyperlipidemia, hypertension, and chronic kidney disease stage IV.Patient presents to the emergency department complaining of shortness of breath and has been admitted for Acute respiratory failure (HCC) [J96.00] Bacteremia [R78.81] Acute respiratory failure with hypoxia (HCC) [J96.01]  Patient is known to our practice and is followed outpatient by Dr. Douglas.  Patient was last seen in office on 03/27/2023 for routine follow-up.  Patient seen ambulating in the room States he feels well Tolerating meals States respiratory status almost at baseline   Objective:  Vital signs in last 24 hours:  Temp:  [97.3 F (36.3 C)-97.8 F (36.6 C)] 97.6 F (36.4 C) (01/03 0751) Pulse Rate:  [61-70] 64 (01/03 0751) Resp:  [18-20] 18 (01/03 0751) BP: (162-189)/(58-77) 162/59 (01/03 0751) SpO2:  [95 %-100 %] 97 % (01/03 0751)  Weight change:  Filed Weights   04/22/23 0132 04/24/23 2052  Weight: 90.3 kg 89.3 kg    Intake/Output: I/O last 3 completed shifts: In: 150 [I.V.:150] Out: -    Intake/Output this shift:  No intake/output data recorded.  Physical Exam: General: NAD, laying in bed  Head: Normocephalic, atraumatic. Moist oral mucosal membranes  Eyes: Anicteric  Lungs:  Clear to auscultation, 2L [place] O2  Heart: Regular rate and rhythm  Abdomen:  Soft, nontender  Extremities: no peripheral edema.  Neurologic: Alert and oriented, moving all four extremities  Skin: No lesions  Access: None    Basic Metabolic Panel: Recent Labs  Lab 04/22/23 0242 04/23/23 0457 04/24/23 0104 04/25/23 0332 04/27/23 0434  NA 138 138 136 139 137  K 4.5 4.1 4.2 4.4 5.2*  CL 102 101 102 105 104  CO2 22 23 21* 22 21*  GLUCOSE 175* 157* 141* 102* 120*  BUN 79* 97* 97* 92* 93*  CREATININE 3.41* 3.99*  3.85* 3.91* 3.51*  CALCIUM 7.8* 8.2* 7.9* 8.1* 8.4*    Liver Function Tests: Recent Labs  Lab 04/21/23 2352  AST 19  ALT 15  ALKPHOS 66  BILITOT 0.7  PROT 6.9  ALBUMIN 3.3*   No results for input(s): LIPASE, AMYLASE in the last 168 hours. No results for input(s): AMMONIA in the last 168 hours.  CBC: Recent Labs  Lab 04/21/23 2352 04/22/23 0242 04/23/23 0457 04/24/23 0104 04/25/23 0332 04/27/23 0434  WBC 7.3 8.1 8.8 9.6 9.7 8.0  NEUTROABS 6.1  --   --   --   --   --   HGB 11.7* 9.8* 9.6* 9.1* 9.7* 9.8*  HCT 36.0* 30.8* 29.1* 27.0* 28.8* 29.7*  MCV 98.9 100.3* 96.7 94.4 96.6 97.7  PLT 158 137* 142* 148* 146* 126*    Cardiac Enzymes: No results for input(s): CKTOTAL, CKMB, CKMBINDEX, TROPONINI in the last 168 hours.  BNP: Invalid input(s): POCBNP  CBG: Recent Labs  Lab 04/26/23 1148 04/26/23 1715 04/26/23 2047 04/27/23 0752 04/27/23 1132  GLUCAP 131* 92 242* 125* 59*    Microbiology: Results for orders placed or performed during the hospital encounter of 04/21/23  Blood Culture (routine x 2)     Status: Abnormal   Collection Time: 04/21/23 11:52 PM   Specimen: Left Antecubital; Blood  Result Value Ref Range Status   Specimen Description   Final    LEFT ANTECUBITAL Performed at [facility], [address] [place], [place] [address]    Special Requests   Final  BOTTLES DRAWN AEROBIC AND ANAEROBIC Blood Culture results may not be optimal due to an inadequate volume of blood received in culture bottles Performed at [facility], [address] [place], [place] [address]    Culture  Setup Time   Final    GRAM POSITIVE COCCI IN BOTH AEROBIC AND ANAEROBIC BOTTLES CRITICAL RESULT CALLED TO, READ BACK BY AND VERIFIED WITH: MADISON HUNT @1053  04/22/23 MJU GRAM STAIN REVIEWED-AGREE WITH RESULT Performed at [facility], [address], [place], [place] [address]    Culture STREPTOCOCCUS GALLOLYTICUS (A)  Final    Report Status 04/27/2023 FINAL  Final   Organism ID, Bacteria STREPTOCOCCUS GALLOLYTICUS  Final      Susceptibility   Streptococcus gallolyticus - MIC*    PENICILLIN 0.12 SENSITIVE Sensitive     CEFTRIAXONE  0.25 SENSITIVE Sensitive     ERYTHROMYCIN <=0.12 SENSITIVE Sensitive     LEVOFLOXACIN  2 SENSITIVE Sensitive     VANCOMYCIN 0.5 SENSITIVE Sensitive     * STREPTOCOCCUS GALLOLYTICUS  Blood Culture ID Panel (Reflexed)     Status: Abnormal   Collection Time: 04/21/23 11:52 PM  Result Value Ref Range Status   Enterococcus faecalis NOT DETECTED NOT DETECTED Final   Enterococcus Faecium NOT DETECTED NOT DETECTED Final   Listeria monocytogenes NOT DETECTED NOT DETECTED Final   Staphylococcus species NOT DETECTED NOT DETECTED Final   Staphylococcus aureus (BCID) NOT DETECTED NOT DETECTED Final   Staphylococcus epidermidis NOT DETECTED NOT DETECTED Final   Staphylococcus lugdunensis NOT DETECTED NOT DETECTED Final   Streptococcus species DETECTED (A) NOT DETECTED Final    Comment: Not Enterococcus species, Streptococcus agalactiae, Streptococcus pyogenes, or Streptococcus pneumoniae. CRITICAL RESULT CALLED TO, READ BACK BY AND VERIFIED WITH: MADISON HUNT @1053  04/22/23 MJU    Streptococcus agalactiae NOT DETECTED NOT DETECTED Final   Streptococcus pneumoniae NOT DETECTED NOT DETECTED Final   Streptococcus pyogenes NOT DETECTED NOT DETECTED Final   A.calcoaceticus-baumannii NOT DETECTED NOT DETECTED Final   Bacteroides fragilis NOT DETECTED NOT DETECTED Final   Enterobacterales NOT DETECTED NOT DETECTED Final   Enterobacter cloacae complex NOT DETECTED NOT DETECTED Final   Escherichia coli NOT DETECTED NOT DETECTED Final   Klebsiella aerogenes NOT DETECTED NOT DETECTED Final   Klebsiella oxytoca NOT DETECTED NOT DETECTED Final   Klebsiella pneumoniae NOT DETECTED NOT DETECTED Final   Proteus species NOT DETECTED NOT DETECTED Final   Salmonella species NOT DETECTED NOT DETECTED Final    Serratia marcescens NOT DETECTED NOT DETECTED Final   Haemophilus influenzae NOT DETECTED NOT DETECTED Final   Neisseria meningitidis NOT DETECTED NOT DETECTED Final   Pseudomonas aeruginosa NOT DETECTED NOT DETECTED Final   Stenotrophomonas maltophilia NOT DETECTED NOT DETECTED Final   Candida albicans NOT DETECTED NOT DETECTED Final   Candida auris NOT DETECTED NOT DETECTED Final   Candida glabrata NOT DETECTED NOT DETECTED Final   Candida krusei NOT DETECTED NOT DETECTED Final   Candida parapsilosis NOT DETECTED NOT DETECTED Final   Candida tropicalis NOT DETECTED NOT DETECTED Final   Cryptococcus neoformans/gattii NOT DETECTED NOT DETECTED Final    Comment: Performed at [facility], [address] [place], [place] [address]  Resp panel by RT-PCR (RSV, Flu A&B, Covid) Anterior Nasal Swab     Status: None   Collection Time: 04/21/23 11:56 PM   Specimen: Anterior Nasal Swab  Result Value Ref Range Status   SARS Coronavirus 2 by RT PCR NEGATIVE NEGATIVE Final    Comment: (NOTE) SARS-CoV-2 target nucleic  acids are NOT DETECTED.  The SARS-CoV-2 RNA is generally detectable in upper respiratory specimens during the acute phase of infection. The lowest concentration of SARS-CoV-2 viral copies this assay can detect is 138 copies/mL. A negative result does not preclude SARS-Cov-2 infection and should not be used as the sole basis for treatment or other patient management decisions. A negative result may occur with  improper specimen collection/handling, submission of specimen other than nasopharyngeal swab, presence of viral mutation(s) within the areas targeted by this assay, and inadequate number of viral copies(<138 copies/mL). A negative result must be combined with clinical observations, patient history, and epidemiological information. The expected result is Negative.  Fact Sheet for Patients:  bloggercourse.com  Fact Sheet for Healthcare  Providers:  seriousbroker.it  This test is no t yet approved or cleared by the [place]  FDA and  has been authorized for detection and/or diagnosis of SARS-CoV-2 by FDA under an Emergency Use Authorization (EUA). This EUA will remain  in effect (meaning this test can be used) for the duration of the COVID-19 declaration under Section 564(b)(1) of the Act, 21 U.S.C.section 360bbb-3(b)(1), unless the authorization is terminated  or revoked sooner.       Influenza A by PCR NEGATIVE NEGATIVE Final   Influenza B by PCR NEGATIVE NEGATIVE Final    Comment: (NOTE) The Xpert Xpress SARS-CoV-2/FLU/RSV plus assay is intended as an aid in the diagnosis of influenza from Nasopharyngeal swab specimens and should not be used as a sole basis for treatment. Nasal washings and aspirates are unacceptable for Xpert Xpress SARS-CoV-2/FLU/RSV testing.  Fact Sheet for Patients: bloggercourse.com  Fact Sheet for Healthcare Providers: seriousbroker.it  This test is not yet approved or cleared by the [place]  FDA and has been authorized for detection and/or diagnosis of SARS-CoV-2 by FDA under an Emergency Use Authorization (EUA). This EUA will remain in effect (meaning this test can be used) for the duration of the COVID-19 declaration under Section 564(b)(1) of the Act, 21 U.S.C. section 360bbb-3(b)(1), unless the authorization is terminated or revoked.     Resp Syncytial Virus by PCR NEGATIVE NEGATIVE Final    Comment: (NOTE) Fact Sheet for Patients: bloggercourse.com  Fact Sheet for Healthcare Providers: seriousbroker.it  This test is not yet approved or cleared by the [place]  FDA and has been authorized for detection and/or diagnosis of SARS-CoV-2 by FDA under an Emergency Use Authorization (EUA). This EUA will remain in effect (meaning this test can be  used) for the duration of the COVID-19 declaration under Section 564(b)(1) of the Act, 21 U.S.C. section 360bbb-3(b)(1), unless the authorization is terminated or revoked.  Performed at [facility], [address] [place], [place] [address]   Blood Culture (routine x 2)     Status: None   Collection Time: 04/22/23  9:49 PM   Specimen: BLOOD  Result Value Ref Range Status   Specimen Description BLOOD BLOOD LEFT ARM  Final   Special Requests   Final    BOTTLES DRAWN AEROBIC AND ANAEROBIC Blood Culture results may not be optimal due to an inadequate volume of blood received in culture bottles   Culture   Final    NO GROWTH 5 DAYS Performed at [facility], [address] [place], [place] [address]    Report Status 04/27/2023 FINAL  Final  Urine Culture (for pregnant, neutropenic or urologic patients or patients with an indwelling urinary catheter)     Status: None   Collection Time: 04/23/23  9:29 AM  Specimen: Urine, Clean Catch  Result Value Ref Range Status   Specimen Description   Final    URINE, CLEAN CATCH Performed at [facility], [address] [place], [place] [address]    Special Requests   Final    NONE Performed at [facility], [address] [place], [place] [address]    Culture   Final    NO GROWTH Performed at [facility], [address], [place], [place] [address]    Report Status 04/25/2023 FINAL  Final  Culture, blood (Routine X 2) w Reflex to ID Panel     Status: None (Preliminary result)   Collection Time: 04/24/23  9:56 AM   Specimen: BLOOD LEFT ARM  Result Value Ref Range Status   Specimen Description BLOOD LEFT ARM  Final   Special Requests   Final    BOTTLES DRAWN AEROBIC AND ANAEROBIC Blood Culture adequate volume   Culture   Final    NO GROWTH 3 DAYS Performed at [facility], [address], [place], [place] [address]    Report Status PENDING  Incomplete  Culture, blood (Routine X  2) w Reflex to ID Panel     Status: None (Preliminary result)   Collection Time: 04/24/23  9:56 AM   Specimen: BLOOD LEFT ARM  Result Value Ref Range Status   Specimen Description BLOOD LEFT ARM  Final   Special Requests   Final    BOTTLES DRAWN AEROBIC AND ANAEROBIC Blood Culture adequate volume   Culture   Final    NO GROWTH 3 DAYS Performed at [facility], [address], [place], [place] [address]    Report Status PENDING  Incomplete    Coagulation Studies: No results for input(s): LABPROT, INR in the last 72 hours.   Urinalysis: No results for input(s): COLORURINE, LABSPEC, PHURINE, GLUCOSEU, HGBUR, BILIRUBINUR, KETONESUR, PROTEINUR, UROBILINOGEN, NITRITE, LEUKOCYTESUR in the last 72 hours.  Invalid input(s): APPERANCEUR     Imaging: ECHO TEE Result Date: 04/26/2023    TRANSESOPHOGEAL ECHO REPORT   Patient Name:   SANDON YOHO Date of Exam: 04/26/2023 Medical Rec #:  [id number]               Height:       63.0 in Accession #:    [id number]              Weight:       196.9 lb Date of Birth:  09/22/1945              BSA:          1.921 m Patient Age:    77 years                BP:           177/79 mmHg Patient Gender: M                       HR:           72 bpm. Exam Location:  [facility] Procedure: Transesophageal Echo, Cardiac Doppler and Color Doppler Indications:     Endocarditis  History:         Patient has prior history of Echocardiogram examinations, most                  recent 04/24/2023. COPD; Risk Factors:Diabetes.  Aortic Valve: valve is present in the aortic position.                  Procedure Date: 5 years ago.  Sonographer:     Christopher Furnace Referring Phys:  [id number] [facility] A KHAN Diagnosing Phys: Denyse Bathe PROCEDURE: The transesophogeal probe was passed without difficulty through the esophogus of the patient. Sedation performed by different physician. The patient developed no complications during the procedure.   IMPRESSIONS  1. Left ventricular ejection fraction, by estimation, is 40 to 45%. The left ventricle has mildly decreased function. The left ventricle demonstrates global hypokinesis. The left ventricular internal cavity size was mildly dilated. There is mild left ventricular hypertrophy.  2. Right ventricular systolic function is mildly reduced. The right ventricular size is mildly enlarged. Mildly increased right ventricular wall thickness.  3. Smoke present. Left atrial size was moderately dilated. No left atrial/left atrial appendage thrombus was detected.  4. Right atrial size was moderately dilated.  5. The mitral valve is normal in structure. Mild to moderate mitral valve regurgitation.  6. Tricuspid valve regurgitation is mild to moderate.  7. The aortic valve has been repaired/replaced. Aortic valve regurgitation is mild. Aortic valve sclerosis/calcification is present, without any evidence of aortic stenosis. There is a valve present in the aortic position. Procedure Date: 5 years ago. Conclusion(s)/Recommendation(s): No evidence of vegetation/infective endocarditis on this transesophageael echocardiogram. FINDINGS  Left Ventricle: Left ventricular ejection fraction, by estimation, is 40 to 45%. The left ventricle has mildly decreased function. The left ventricle demonstrates global hypokinesis. The left ventricular internal cavity size was mildly dilated. There is  mild left ventricular hypertrophy. Right Ventricle: The right ventricular size is mildly enlarged. Mildly increased right ventricular wall thickness. Right ventricular systolic function is mildly reduced. Left Atrium: Smoke present. Left atrial size was moderately dilated. Spontaneous echo contrast was present. No left atrial/left atrial appendage thrombus was detected. Right Atrium: Right atrial size was moderately dilated. Pericardium: There is no evidence of pericardial effusion. Mitral Valve: The mitral valve is normal in structure. Mild to  moderate mitral valve regurgitation. Tricuspid Valve: The tricuspid valve is normal in structure. Tricuspid valve regurgitation is mild to moderate. Aortic Valve: The aortic valve has been repaired/replaced. Aortic valve regurgitation is mild. Aortic valve sclerosis/calcification is present, without any evidence of aortic stenosis. There is a valve present in the aortic position. Procedure Date: 5 years ago. Pulmonic Valve: The pulmonic valve was grossly normal. Pulmonic valve regurgitation is mild to moderate. Aorta: The aortic root, ascending aorta and aortic arch are all structurally normal, with no evidence of dilitation or obstruction. IAS/Shunts: No atrial level shunt detected by color flow Doppler. [employer] Electronically signed by Denyse Bathe Signature Date/Time: 04/26/2023/9:57:04 AM    Final      Medications:    cefTRIAXone  (ROCEPHIN )  IV 2 g (04/27/23 0925)    vitamin C   1,000 mg Oral QHS   aspirin  EC  81 mg Oral Daily   calcitRIOL   0.25 mcg Oral Daily   citalopram   40 mg Oral Daily   docusate sodium   200 mg Oral BID   enoxaparin  (LOVENOX ) injection  30 mg Subcutaneous Q24H   ezetimibe   10 mg Oral QHS   febuxostat   40 mg Oral Daily   gabapentin   200 mg Oral Daily   gabapentin   400 mg Oral QHS   guaiFENesin   600 mg Oral BID   insulin  aspart  0-15 Units Subcutaneous TID WC   insulin  aspart  0-5 Units Subcutaneous QHS   metoprolol  succinate  50 mg Oral Daily   multivitamin  1 tablet Oral BID   pantoprazole   40 mg Oral Daily   predniSONE   40 mg Oral Q breakfast   tamsulosin   0.4 mg Oral Daily   acetaminophen  **OR** acetaminophen , busPIRone , chlorpheniramine-HYDROcodone , ipratropium-albuterol , loratadine , magnesium  hydroxide, ondansetron  **OR** ondansetron  (ZOFRAN ) IV, traZODone   Assessment/ Plan:  Mr. Niccolas Loeper is a 78 y.o.  male with past medical conditions including CAD, basal cell carcinoma, arthritis, COPD, gout, CHF, hyperlipidemia, hypertension, and chronic  kidney disease stage IV.Patient presents to the emergency department complaining of shortness of breath and has been admitted for Acute respiratory failure (HCC) [J96.00] Bacteremia [R78.81] Acute respiratory failure with hypoxia (HCC) [J96.01]  Chronic kidney disease stage IV with baseline creatinine 3.59 and GFR of 17 on 12/11/22.    Creatinine remains at baseline.  Patient reports adequate urine output.  No acute indication for dialysis.  Patient will need to follow-up in office at discharge.  Lab Results  Component Value Date   CREATININE 3.51 (H) 04/27/2023   CREATININE 3.91 (H) 04/25/2023   CREATININE 3.85 (H) 04/24/2023   No intake or output data in the 24 hours ending 04/27/23 1329   2.  Acute respiratory failure likely secondary to COPD exacerbation versus diastolic CHF exacerbation.   Diuretics held.    3.  Bacteremia, positive blood cultures growing strep gallolyticus.  ID consulted. TEE completed on 04/26/23, negative for vegetation.  Currently receiving ceftriaxone  daily.   4. Anemia of chronic kidney disease Lab Results  Component Value Date   HGB 9.8 (L) 04/27/2023    Hemoglobin within desired range.  4. Hypertension: elevated readings.  - metoprolol  and tamsulosin  -Blood pressure acceptable, 162/59.   LOS: 5 Lazarus [place] 1/3/20251:29 PM

## 2023-04-27 NOTE — Progress Notes (Addendum)
 Date of Admission:  04/21/2023     ID: [name] [name] is a 78 y.o. male  Principal Problem:   Bacteremia due to Streptococcus Active Problems:   COPD with acute exacerbation (HCC)   CKD (chronic kidney disease), stage IV (HCC)   Essential hypertension   BPH (benign prostatic hyperplasia)   Acute respiratory failure (HCC)   Gout   Dyslipidemia   Anxiety and depression   GERD without esophagitis   Type 2 diabetes mellitus with chronic kidney disease, without long-term current use of insulin  (HCC)   Acute on chronic combined systolic and diastolic CHF (congestive heart failure) (HCC)  [name] [name] is a 78 y.o. with a history of CKD, gout, DM, CAD, HFrEF, hypothyroidism, copd, HTN, CVA, TAVR Presented to the ED on 04/21/23 with acute shortness of breath. He was having chills and had to use 2 blankets. EMS documented temp of 100.6.  Spoke to his wife and she said this was acute presentation  Subjective: Feeling much better Is getting colonoscopy tomorrow  Medications:   vitamin C   1,000 mg Oral QHS   aspirin  EC  81 mg Oral Daily   calcitRIOL   0.25 mcg Oral Daily   citalopram   40 mg Oral Daily   docusate sodium   200 mg Oral BID   enoxaparin  (LOVENOX ) injection  30 mg Subcutaneous Q24H   ezetimibe   10 mg Oral QHS   febuxostat   40 mg Oral Daily   gabapentin   200 mg Oral Daily   gabapentin   400 mg Oral QHS   guaiFENesin   600 mg Oral BID   insulin  aspart  0-15 Units Subcutaneous TID WC   insulin  aspart  0-5 Units Subcutaneous QHS   metoprolol  succinate  50 mg Oral Daily   multivitamin  1 tablet Oral BID   pantoprazole   40 mg Oral Daily   predniSONE   40 mg Oral Q breakfast   tamsulosin   0.4 mg Oral Daily    Objective: Vital signs in last 24 hours: Patient Vitals for the past 24 hrs:  BP Temp Temp src Pulse Resp SpO2  04/27/23 0751 (!) 162/59 97.6 F (36.4 C) Oral 64 18 97 %  04/27/23 0431 (!) 185/77 97.8 F (36.6 C) -- 69 19 98 %  04/27/23 0008 (!)  168/58 (!) 97.3 F (36.3 C) -- 70 18 97 %  04/27/23 0005 -- (!) 97.3 F (36.3 C) -- 61 -- 97 %  04/26/23 2219 (!) 189/75 -- -- 64 -- --  04/26/23 2047 (!) 183/76 97.7 F (36.5 C) -- 63 20 100 %  04/26/23 1714 (!) 179/73 97.7 F (36.5 C) Oral 70 18 95 %     PHYSICAL EXAM:  General: Alert, cooperative, no distress, appears stated age.  .Lungs: b/l air entry  Heart: s1s2. Abdomen: Soft, non-tender,not distended. Bowel sounds normal. No masses Extremities: atraumatic, no cyanosis. No edema. No clubbing Skin: No rashes or lesions. Or bruising Lymph: Cervical, supraclavicular normal. Neurologic: Grossly non-focal  Lab Results    Latest Ref Rng & Units 04/27/2023    4:34 AM 04/25/2023    3:32 AM 04/24/2023    1:04 AM  CBC  WBC 4.0 - 10.5 K/uL 8.0  9.7  9.6   Hemoglobin 13.0 - 17.0 g/dL 9.8  9.7  9.1   Hematocrit 39.0 - 52.0 % 29.7  28.8  27.0   Platelets 150 - 400 K/uL 126  146  148        Latest Ref Rng & Units 04/27/2023  4:34 AM 04/25/2023    3:32 AM 04/24/2023    1:04 AM  CMP  Glucose 70 - 99 mg/dL 879  897  858   BUN 8 - 23 mg/dL 93  92  97   Creatinine 0.61 - 1.24 mg/dL 6.48  6.08  6.14   Sodium 135 - 145 mmol/L 137  139  136   Potassium 3.5 - 5.1 mmol/L 5.2  4.4  4.2   Chloride 98 - 111 mmol/L 104  105  102   CO2 22 - 32 mmol/L 21  22  21    Calcium 8.9 - 10.3 mg/dL 8.4  8.1  7.9       Microbiology: BC= Streptococcus gallolyticus 04/21/23 04/24/23 BC- NG Studies/Results: ECHO TEE Result Date: 04/26/2023    TRANSESOPHOGEAL ECHO REPORT   Patient Name:   [name] [name] Date of Exam: 04/26/2023 Medical Rec #:  [id number]               Height:       63.0 in Accession #:    [id number]              Weight:       196.9 lb Date of Birth:  1946/01/19              BSA:          1.921 m Patient Age:    77 years                BP:           177/79 mmHg Patient Gender: M                       HR:           72 bpm. Exam Location:  [facility] Procedure: Transesophageal Echo, Cardiac  Doppler and Color Doppler Indications:     Endocarditis  History:         Patient has prior history of Echocardiogram examinations, most                  recent 04/24/2023. COPD; Risk Factors:Diabetes.                  Aortic Valve: valve is present in the aortic position.                  Procedure Date: 5 years ago.  Sonographer:     Christopher Furnace Referring Phys:  [id number] [facility] A KHAN Diagnosing Phys: Denyse Bathe PROCEDURE: The transesophogeal probe was passed without difficulty through the esophogus of the patient. Sedation performed by different physician. The patient developed no complications during the procedure.  IMPRESSIONS  1. Left ventricular ejection fraction, by estimation, is 40 to 45%. The left ventricle has mildly decreased function. The left ventricle demonstrates global hypokinesis. The left ventricular internal cavity size was mildly dilated. There is mild left ventricular hypertrophy.  2. Right ventricular systolic function is mildly reduced. The right ventricular size is mildly enlarged. Mildly increased right ventricular wall thickness.  3. Smoke present. Left atrial size was moderately dilated. No left atrial/left atrial appendage thrombus was detected.  4. Right atrial size was moderately dilated.  5. The mitral valve is normal in structure. Mild to moderate mitral valve regurgitation.  6. Tricuspid valve regurgitation is mild to moderate.  7. The aortic valve has been repaired/replaced. Aortic valve regurgitation is mild. Aortic valve sclerosis/calcification is present, without any evidence of aortic stenosis.  There is a valve present in the aortic position. Procedure Date: 5 years ago. Conclusion(s)/Recommendation(s): No evidence of vegetation/infective endocarditis on this transesophageael echocardiogram. FINDINGS  Left Ventricle: Left ventricular ejection fraction, by estimation, is 40 to 45%. The left ventricle has mildly decreased function. The left ventricle demonstrates global  hypokinesis. The left ventricular internal cavity size was mildly dilated. There is  mild left ventricular hypertrophy. Right Ventricle: The right ventricular size is mildly enlarged. Mildly increased right ventricular wall thickness. Right ventricular systolic function is mildly reduced. Left Atrium: Smoke present. Left atrial size was moderately dilated. Spontaneous echo contrast was present. No left atrial/left atrial appendage thrombus was detected. Right Atrium: Right atrial size was moderately dilated. Pericardium: There is no evidence of pericardial effusion. Mitral Valve: The mitral valve is normal in structure. Mild to moderate mitral valve regurgitation. Tricuspid Valve: The tricuspid valve is normal in structure. Tricuspid valve regurgitation is mild to moderate. Aortic Valve: The aortic valve has been repaired/replaced. Aortic valve regurgitation is mild. Aortic valve sclerosis/calcification is present, without any evidence of aortic stenosis. There is a valve present in the aortic position. Procedure Date: 5 years ago. Pulmonic Valve: The pulmonic valve was grossly normal. Pulmonic valve regurgitation is mild to moderate. Aorta: The aortic root, ascending aorta and aortic arch are all structurally normal, with no evidence of dilitation or obstruction. IAS/Shunts: No atrial level shunt detected by color flow Doppler. [employer] Electronically signed by Denyse Bathe Signature Date/Time: 04/26/2023/9:57:04 AM    Final      Assessment/Plan: Streptococcus gallolyticus bacteremia - with TAVR- concern for endocarditis especially with SOB, but TEE was fine with no evidence of endocarditis This bacteria is very frequently associated with colon cancer and he  will need colonoscopy GI saw him today and they will do colonoscopy tomorrow   Currently on  ceftriaxone - day 5 Continue IV as long as in the hospital. On discharge PO amoxicillin  1 gram BID ( adjusted to cr cl ) until 05/06/23    CHF   CAD    GOUT- recently took a course of prednisone    on Febuxostat  Discussed the management with patient and Dr.Wieting Need CMP/CBC next Wednesday as OP  ID will follow her peripherally this weekend- contact ID on call if needed for urgent issues

## 2023-04-27 NOTE — Progress Notes (Signed)
 Progress Note   Patient: [name] [name] FMW:[id number] DOB: October 01, 1945 DOA: 04/21/2023     5 DOS: the patient was seen and examined on 04/27/2023   Brief hospital course: 78 year old man past medical history of congestive heart failure, CAD, GERD, gout, chronic oxygen .  Dyslipidemia, obstructive sleep apnea, stage IV chronic kidney disease presented to the emergency room with worsening shortness of breath, fever.  Patient was found to have Streptococcus gallolyticus and blood cultures.  Echocardiogram does not show any endocarditis but its moderate mitral regurgitation.  1/1.  Continue IV Rocephin .  TEE will need to be done to determine length of treatment. 1/2.  TEE negative for endocarditis.  Plan for potential discharge tomorrow after IV Rocephin  dose.  Will be switched over to amoxicillin .  Will need repeat blood cultures 2 weeks and 4 weeks after. 1/3.  Case discussed with Dr. Aundria gastroenterology and he came and saw the patient decided to do colonoscopy while he is here.  Case discussed with infectious disease specialist and the Streptococcus is sensitive to amoxicillin  and recommends another 9 days of amoxicillin  when we do discharge him.  Assessment and Plan: * Bacteremia due to Streptococcus Streptococcus gallolyticus growing out of initial blood cultures.  Continue IV Rocephin  while here and switch over to amoxicillin  upon discharge for 9 days.  TEE negative.  Repeat blood cultures drawn on 12/31 negative so far.  As per ID, repeat blood cultures after antibiotic course in 2 weeks and in 4 weeks.  Streptococcus gallolyticus can come from a colon cancer and Dr. Aundria did see the patient today and will set up for colonoscopy for tomorrow.  Acute on chronic respiratory failure with hypoxia (HCC) Patient wears oxygen  at night continuous and as needed during the day.  Continue bronchodilators.  On IV Rocephin .  COPD with acute exacerbation (HCC) Continue bronchodilators, IV  Rocephin .  Completed prednisone  today.  CKD (chronic kidney disease), stage IV (HCC) Last creatinine 3.51 with a GFR 17  Acute on chronic combined systolic and diastolic CHF (congestive heart failure) (HCC) Last EF 50% with moderate mitral regurgitation.  Diuretics on hold.  On Toprol .  Dyslipidemia Continue with Zetia  and outpatient Repatha.  Essential hypertension Patient on Toprol -XL  Type 2 diabetes mellitus with chronic kidney disease, without long-term current use of insulin  (HCC) Last hemoglobin A1c a few months ago 5.5  Iron deficiency anemia Hemoglobin stable at 9.8.  GERD without esophagitis continue Carafate  and PPI therapy.  Anxiety and depression Continue BuSpar  and citalopram .  Gout Continue his Uloric .  Holding colchicine   BPH (benign prostatic hyperplasia) Continue Flomax .  Hyperkalemia Given a dose of Lokelma  this morning.        Subjective: Patient feels okay.  Interested in going home.  Case discussed with Dr. Aundria who will set up for colonoscopy for tomorrow.  Physical Exam: Vitals:   04/27/23 0005 04/27/23 0008 04/27/23 0431 04/27/23 0751  BP:  (!) 168/58 (!) 185/77 (!) 162/59  Pulse: 61 70 69 64  Resp:  18 19 18   Temp: (!) 97.3 F (36.3 C) (!) 97.3 F (36.3 C) 97.8 F (36.6 C) 97.6 F (36.4 C)  TempSrc:    Oral  SpO2: 97% 97% 98% 97%  Weight:      Height:       Physical Exam HENT:     Head: Normocephalic.     Mouth/Throat:     Pharynx: No oropharyngeal exudate.  Eyes:     General: Lids are normal.     Conjunctiva/sclera:  Conjunctivae normal.  Cardiovascular:     Rate and Rhythm: Normal rate and regular rhythm.     Heart sounds: Normal heart sounds, S1 normal and S2 normal.  Pulmonary:     Breath sounds: Examination of the right-lower field reveals decreased breath sounds. Examination of the left-lower field reveals decreased breath sounds. Decreased breath sounds present. No wheezing, rhonchi or rales.  Abdominal:      Palpations: Abdomen is soft.     Tenderness: There is no abdominal tenderness.  Musculoskeletal:     Right lower leg: Swelling present.     Left lower leg: No swelling.  Skin:    General: Skin is warm.     Findings: No rash.  Neurological:     Mental Status: He is alert and oriented to person, place, and time.     Data Reviewed: Hemoglobin 9.8, potassium 5.2, platelet count 126, creatinine 3.51 with a GFR 17  Family Communication: Updated wife on the phone  Disposition: Status is: Inpatient Remains inpatient appropriate because: GI will set up for colonoscopy tomorrow.  Planned Discharge Destination: Home    Time spent: 28 minutes  Author: Charlie Patterson, MD 04/27/2023 3:30 PM  For on call review [email or web address].

## 2023-04-27 NOTE — Assessment & Plan Note (Addendum)
 Hemoglobin stable at 10.6.

## 2023-04-27 NOTE — Consult Note (Signed)
 [facility] GI Inpatient Consult Note   Ladell Francis Boss, M.D.  Reason for Consult: Streptococcus gallolyticus positive blood culture, consider colonoscopy to rule out colon neoplasm   Attending Requesting Consult: Charlie Patterson, M.D.  Outpatient Primary Physician: Alan Arrant, FNP  History of Present Illness: [name] [name] is a 78 y.o. 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  Patient also had COPD exacerbation as well as diagnosis of CHF exacerbation accompanying his clinical presentation. Patient reports he had a colonoscopy performed remotely about 20 years ago at which time polyps were removed.  He never followed up for surveillance colonoscopy per his admission.  From a GI standpoint he denies any upper symptoms such as heartburn, indigestion, dysphagia, anorexia, eructation, hematemesis, severe waterbrash, pyrosis.  He denies any abdominal pain.  From a lower GI standpoint patient denies any change in bowel habits, abdominal pain or hematochezia.  He has no involuntary weight loss. Patient reports feeling better after diuresis and pulmonary management. Patient was seen in our office on 11/15/2022 by Jonette Primmer, PA-C for an elective visit to set up a screening colonoscopy.  Given the patient's significant comorbidities and oxygen  dependency, Mr. Primmer did not advise elective procedures.  Past Medical History:  Past Medical History:  Diagnosis Date   Accidental cut, puncture, perforation, or hemorrhage during heart catheterization 12/27/2012   Acute postoperative pain 08/02/2016   Acute renal failure (ARF) (HCC)    AKI (acute kidney injury) (HCC) 07/27/2020   ANA positive 07/12/2015   Aortic stenosis, severe    s/p TAVR  08/01/2016   Arthritis    Basal cell carcinoma 01/04/2010   Right sup. post. helix. 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   CAD (coronary artery disease)    CAP (community acquired pneumonia) 07/26/2020   CKD (chronic kidney disease), stage IV (HCC)    Complete tear of right rotator cuff 11/15/2015   Complex tear of lateral meniscus of left knee as current injury 12/06/2020   Complex tear of medial meniscus of left knee as current injury 10/18/2020   COPD (chronic obstructive pulmonary disease) (HCC)    DM (diabetes mellitus), type 2 (HCC) 07/30/2020   no meds   DOE (dyspnea on exertion)    GERD (gastroesophageal reflux disease)    Gout    Grade I diastolic dysfunction    Heart murmur    HFrEF (heart failure with reduced ejection fraction) (HCC)    History of hiatal hernia    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  Performed by Dr. Ricky on 08/01/16:  29 mm commercially available  Medtronic Core Valve transcatheter aortic valve procedure placed via transfemoral approach by Dr. Ricky and co-surgeon Dr. Godfrey.   SCC (squamous cell carcinoma) 12/05/2022   left superior forehead,  Mohs 02/20/23   Squamous cell carcinoma in situ (SCCIS) 12/05/2022   right zygoma, Mohs 02/20/23   Squamous cell carcinoma of skin 05/05/2019   Right malar  cheek. MOHS.   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/07/2021   Right zygoma, [facility] 05/02/21   Supplemental oxygen  dependent    3-4 L/[place]   Supplemental oxygen  dependent    T2DM (type 2 diabetes mellitus) (HCC)    no meds    Problem List: Patient Active Problem List   Diagnosis Date Noted   Acute on chronic combined systolic and diastolic CHF (congestive heart failure) (HCC) 04/25/2023   Bacteremia due to Streptococcus 04/24/2023   Acute respiratory failure (HCC) 04/22/2023   Gout 04/22/2023   Dyslipidemia 04/22/2023   Anxiety and depression 04/22/2023   GERD without esophagitis 04/22/2023   Type 2 diabetes mellitus with chronic kidney disease, without long-term current use of insulin  (HCC) 04/22/2023   Gastroenteritis 02/05/2023   Hypomagnesemia 02/05/2023   AKI (acute kidney injury) (HCC) 02/05/2023   Diarrhea 02/04/2023   Abdominal pain 02/04/2023   Electrolyte abnormality 02/04/2023   Claudication of both lower extremities (HCC) 05/29/2022   Plica syndrome of left knee 10/18/2020   Hyperkalemia 07/30/2020   OSA on CPAP 07/30/2020   BPH (benign prostatic hyperplasia) 07/30/2020   GERD (gastroesophageal reflux disease) 07/30/2020   HFrEF (heart failure with reduced ejection fraction) (HCC) 07/30/2020   Acute kidney injury superimposed on stage 4 chronic kidney disease (HCC) 07/27/2020   Chronic respiratory failure with hypoxia (HCC) 07/27/2020   CKD (chronic kidney disease), stage IV (HCC) 07/27/2020   Essential hypertension 07/27/2020   Minimal change disease 02/24/2019   Proteinuria 02/24/2019   Secondary hyperparathyroidism of renal origin (HCC) 02/24/2019   Type 2 diabetes mellitus with diabetic chronic kidney disease (HCC) 02/24/2019   On home oxygen  therapy 08/02/2016   Idiopathic hypotension 07/26/2016   COPD with acute exacerbation (HCC)    Arthralgia of multiple sites 07/12/2015   Coronary artery disease 12/27/2012   Hyperlipidemia 12/27/2012    Past Surgical  History: Past Surgical History:  Procedure Laterality Date   AORTIC VALVE REPLACEMENT N/A 08/01/2016   29 mm CoreValve Evolut; Location: [facility]; Surgeon: Reyes Ricky, MD   CARDIAC CATHETERIZATION N/A 12/26/2012   2v CAD; retained pigtail in myocardium --> transferred to [facility]; Location: [facility]; Surgeon: Vita Bathe, MD   CATARACT EXTRACTION W/PHACO Left 10/03/2022   Procedure: CATARACT EXTRACTION PHACO AND INTRAOCULAR LENS PLACEMENT (IOC) LEFT DIABETIC 8.22 00:55.5;  Surgeon: Jaye Fallow, MD;  Location: [facility];  Service: Ophthalmology;  Laterality: Left;  sleep apnea   CATARACT EXTRACTION W/PHACO Right 10/17/2022   Procedure: CATARACT EXTRACTION PHACO AND INTRAOCULAR LENS PLACEMENT (IOC) RIGHT DIABETIC;  Surgeon: Jaye Fallow, MD;  Location: [facility];  Service: Ophthalmology;  Laterality: Right;  4.79 0:37.4   COLONOSCOPY     KNEE ARTHROSCOPY WITH MEDIAL MENISECTOMY Left 12/02/2020   Procedure: KNEE ARTHROSCOPY WITH DEBRIDEMENT AND PARTIAL MEDIAL AND LATERAL MENISECTOMY;  Surgeon: Edie Norleen PARAS, MD;  Location: [facility] ORS;  Service: Orthopedics;  Laterality: Left;   LITHOTRIPSY     PERCUTANEOUS REMOVAL INTRA-AORTIC BALLOON CATH N/A 12/26/2012   Procedure: PERCUTANEOUS REMOVAL INTRA-AORTIC BALLOON CATH; Surgeon: Lang Lannie Salter, MD; Location: [facility]; Service: Cardiothoracic   RIGHT HEART CATH Right 07/13/2016   Location: [facility];  Surgeon: Ozell Estelle, MD   TRANSESOPHAGEAL ECHOCARDIOGRAM N/A 12/26/2012   Procedure: TRANSESOPHAGEAL ECHOCARDIOGRAPHY; Surgeon: Lang Lannie Salter, MD; Location: [facility]; Service: Cardiothoracic   UMBILICAL HERNIA REPAIR N/A 02/13/2014   Procedure: LAPAROSCOPIC UMBILICAL HERNIA REPAIR; Surgeon: Shanda ONEIDA Salter, MD; Location: [facility]; Service: General Surgery    Allergies: Allergies  Allergen Reactions   Nsaids Other (See Comments)    Avoid due to kidney disease    Statins Other (See Comments)     Myalgias   Nexletol [Bempedoic Acid] Diarrhea    fatigue   Silver Other (See Comments)   Tegaderm High Gelling Alginate [Wound Dressings] Rash    Home Medications: Medications Prior to Admission  Medication Sig Dispense Refill Last Dose/Taking   acetaminophen  (TYLENOL ) 650 MG CR tablet Take 1,300 mg by mouth every 8 (eight) hours as needed for pain.   Taking As Needed   albuterol  (VENTOLIN  HFA) 108 (90 Base) MCG/ACT inhaler Inhale 2 puffs into the lungs every 6 (six) hours as needed for wheezing or shortness of breath.   Taking As Needed   Ascorbic Acid  (VITAMIN C ) 1000 MG tablet Take 1,000 mg by mouth at bedtime.   04/21/2023 Bedtime   aspirin  EC 81 MG EC tablet Take 1 tablet (81 mg total) by mouth daily.   04/21/2023 Morning   busPIRone  (BUSPAR ) 5 MG tablet TAKE 1 TABLET BY MOUTH TWICE DAILY AS NEEDED 60 tablet 3 Taking As Needed   calcitRIOL  (ROCALTROL ) 0.25 MCG capsule Take 0.25 mcg by mouth daily.   04/21/2023 Morning   citalopram  (CELEXA ) 40 MG tablet TAKE 1 TABLET EVERY DAY 90 tablet 3 04/21/2023 Morning   colchicine  0.6 MG tablet Take 0.3 mg by mouth daily.   04/21/2023 Morning   Evolocumab (REPATHA SURECLICK) 140 MG/ML SOAJ INJECT CONTENT OF 1 CARTRIDGE UNDER THE SKIN EVERY OTHER WEEK 90 mL 1 Taking   ezetimibe  (ZETIA ) 10 MG tablet TAKE 1 TABLET(10 MG) BY MOUTH AT BEDTIME 90 tablet 3 04/21/2023 Bedtime   febuxostat  (ULORIC ) 40 MG tablet Take 40 mg by mouth daily.   04/21/2023 Morning   fexofenadine (ALLEGRA) 180 MG tablet Take 180 mg by mouth daily as needed for allergies or rhinitis.   Taking As Needed   Fluticasone -Umeclidin-Vilant (TRELEGY ELLIPTA ) 100-62.5-25 MCG/ACT AEPB Inhale 1 Inhalation into the lungs daily at 6 (six) AM. (Patient taking differently: Inhale 1 Inhalation into the lungs daily as needed.) 60 each 11 Taking Differently   gabapentin  (NEURONTIN ) 100 MG capsule TAKE 2 CAPSULES TWICE DAILY 360 capsule 3 04/21/2023 Morning   gabapentin  (NEURONTIN ) 400 MG capsule TAKE  1 CAPSULE AT BEDTIME 90 capsule 3 04/21/2023 Bedtime   metoprolol  succinate (TOPROL -XL) 50 MG 24 hr tablet TAKE 1 TABLET EVERY DAY 90 tablet 3 04/21/2023   Multiple Vitamins-Minerals (PRESERVISION AREDS) CAPS Take 1 capsule by mouth 2 (two) times daily.   04/21/2023 Bedtime   ondansetron  (ZOFRAN -ODT) 4 MG disintegrating tablet Take 1 tablet (4 mg total) by mouth every 8 (eight) hours as needed for nausea or vomiting. 20 tablet 0 Taking As Needed   pantoprazole  (PROTONIX ) 40 MG tablet Take 40 mg by mouth every morning.   04/21/2023 Morning   Simethicone  (GAS-X PO) Take 1 tablet by mouth 2 (two) times daily as needed (flatulence).   Taking As Needed   sodium chloride  (OCEAN) 0.65 % SOLN nasal spray Place 1 spray into both nostrils as needed for congestion.  0 Taking As Needed   SUPER B COMPLEX/C PO Take 1 tablet by  mouth daily.   04/21/2023 Morning   tamsulosin  (FLOMAX ) 0.4 MG CAPS capsule TAKE 1 CAPSULE EVERY DAY 90 capsule 3 04/21/2023 Morning   torsemide  (DEMADEX ) 20 MG tablet Take 20 mg by mouth daily.   04/21/2023 Morning   Alcohol Swabs (DROPSAFE ALCOHOL PREP) 70 % PADS Apply 1 each topically as needed (With repatha and blood sugar checks.).      ipratropium-albuterol  (DUONEB) 0.5-2.5 (3) MG/3ML SOLN Take 3 mLs by nebulization every 4 (four) hours as needed. (Patient not taking: Reported on 04/22/2023) 360 mL 3 Not Taking   OXYGEN  Inhale 3 L into the lungs See admin instructions. Used as needed throughout the day and continuous at bedtime      sucralfate  (CARAFATE ) 1 g tablet Take 1 tablet (1 g total) by mouth 4 (four) times daily. (Patient not taking: Reported on 04/22/2023) 120 tablet 1 Not Taking   Home medication reconciliation was completed with the patient.   Scheduled Inpatient Medications:    vitamin C   1,000 mg Oral QHS   aspirin  EC  81 mg Oral Daily   calcitRIOL   0.25 mcg Oral Daily   citalopram   40 mg Oral Daily   docusate sodium   200 mg Oral BID   enoxaparin  (LOVENOX )  injection  30 mg Subcutaneous Q24H   ezetimibe   10 mg Oral QHS   febuxostat   40 mg Oral Daily   gabapentin   200 mg Oral Daily   gabapentin   400 mg Oral QHS   guaiFENesin   600 mg Oral BID   insulin  aspart  0-15 Units Subcutaneous TID WC   insulin  aspart  0-5 Units Subcutaneous QHS   metoprolol  succinate  50 mg Oral Daily   multivitamin  1 tablet Oral BID   pantoprazole   40 mg Oral Daily   predniSONE   40 mg Oral Q breakfast   tamsulosin   0.4 mg Oral Daily    Continuous Inpatient Infusions:    cefTRIAXone  (ROCEPHIN )  IV 2 g (04/27/23 0925)    PRN Inpatient Medications:  acetaminophen  **OR** acetaminophen , busPIRone , chlorpheniramine-HYDROcodone , ipratropium-albuterol , loratadine , magnesium  hydroxide, ondansetron  **OR** ondansetron  (ZOFRAN ) IV, traZODone   Family History: family history includes Arthritis in his son; Asthma in his daughter; COPD in his daughter; Depression in his daughter; Diabetes in his daughter and son; Hyperlipidemia in his daughter; Hypertension in his son; Obesity in his daughter; Varicose Veins in his daughter. He was adopted.   GI Family History: Negative.  Social History:   reports that he quit smoking about 15 years ago. His smoking use included cigarettes. He started smoking about 69 years ago. He has a 108 pack-year smoking history. He quit smokeless tobacco use about 18 years ago.  His smokeless tobacco use included chew. He reports that he does not drink alcohol and does not use drugs. The patient denies ETOH, tobacco, or drug use.    Review of Systems: Review of Systems - Negative except HPI  Physical Examination: BP (!) 162/59 (BP Location: Right Arm)   Pulse 64   Temp 97.6 F (36.4 C) (Oral)   Resp 18   Ht 5' 3 (1.6 m)   Wt 89.3 kg   SpO2 97%   BMI 34.87 kg/m  Physical Exam Constitutional:      General: He is not in acute distress.    Appearance: He is obese. He is not toxic-appearing or diaphoretic.     Interventions: He is not  intubated. HENT:     Head: Normocephalic and atraumatic.  Eyes:     Pupils:  Pupils are equal, round, and reactive to light.  Neck:     Thyroid : No thyromegaly.  Cardiovascular:     Rate and Rhythm: Normal rate.     Heart sounds: Murmur heard.     No friction rub. No gallop.  Pulmonary:     Effort: He is not intubated.     Breath sounds: No stridor. Decreased breath sounds present. No rhonchi or rales.  Chest:     Chest wall: No tenderness.  Abdominal:     General: Bowel sounds are normal.     Palpations: Abdomen is soft.  Musculoskeletal:        General: Normal range of motion.     Cervical back: Normal range of motion.     Right lower leg: No edema.     Left lower leg: No edema.  Lymphadenopathy:     Cervical: No cervical adenopathy.  Skin:    General: Skin is warm and dry.  Neurological:     General: No focal deficit present.     Mental Status: He is alert.  Psychiatric:        Mood and Affect: Mood normal.        Behavior: Behavior normal. Behavior is not agitated.     Data: Lab Results  Component Value Date   WBC 8.0 04/27/2023   HGB 9.8 (L) 04/27/2023   HCT 29.7 (L) 04/27/2023   MCV 97.7 04/27/2023   PLT 126 (L) 04/27/2023   Recent Labs  Lab 04/24/23 0104 04/25/23 0332 04/27/23 0434  HGB 9.1* 9.7* 9.8*   Lab Results  Component Value Date   NA 137 04/27/2023   K 5.2 (H) 04/27/2023   CL 104 04/27/2023   CO2 21 (L) 04/27/2023   BUN 93 (H) 04/27/2023   CREATININE 3.51 (H) 04/27/2023   GLU 100 02/22/2022   Lab Results  Component Value Date   ALT 15 04/21/2023   AST 19 04/21/2023   ALKPHOS 66 04/21/2023   BILITOT 0.7 04/21/2023   Recent Labs  Lab 04/21/23 2352  APTT 29  INR 1.1      Latest Ref Rng & Units 04/27/2023    4:34 AM 04/25/2023    3:32 AM 04/24/2023    1:04 AM  CBC  WBC 4.0 - 10.5 K/uL 8.0  9.7  9.6   Hemoglobin 13.0 - 17.0 g/dL 9.8  9.7  9.1   Hematocrit 39.0 - 52.0 % 29.7  28.8  27.0   Platelets 150 - 400 K/uL 126  146  148      STUDIES: ECHO TEE Result Date: 04/26/2023    TRANSESOPHOGEAL ECHO REPORT   Patient Name:   KEISTON MANLEY Date of Exam: 04/26/2023 Medical Rec #:  [id number]               Height:       63.0 in Accession #:    [id number]              Weight:       196.9 lb Date of Birth:  10-25-1945              BSA:          1.921 m Patient Age:    77 years                BP:           177/79 mmHg Patient Gender: M  HR:           72 bpm. Exam Location:  [facility] Procedure: Transesophageal Echo, Cardiac Doppler and Color Doppler Indications:     Endocarditis  History:         Patient has prior history of Echocardiogram examinations, most                  recent 04/24/2023. COPD; Risk Factors:Diabetes.                  Aortic Valve: valve is present in the aortic position.                  Procedure Date: 5 years ago.  Sonographer:     Christopher Furnace Referring Phys:  [id number] [facility] A KHAN Diagnosing Phys: Denyse Bathe PROCEDURE: The transesophogeal probe was passed without difficulty through the esophogus of the patient. Sedation performed by different physician. The patient developed no complications during the procedure.  IMPRESSIONS  1. Left ventricular ejection fraction, by estimation, is 40 to 45%. The left ventricle has mildly decreased function. The left ventricle demonstrates global hypokinesis. The left ventricular internal cavity size was mildly dilated. There is mild left ventricular hypertrophy.  2. Right ventricular systolic function is mildly reduced. The right ventricular size is mildly enlarged. Mildly increased right ventricular wall thickness.  3. Smoke present. Left atrial size was moderately dilated. No left atrial/left atrial appendage thrombus was detected.  4. Right atrial size was moderately dilated.  5. The mitral valve is normal in structure. Mild to moderate mitral valve regurgitation.  6. Tricuspid valve regurgitation is mild to moderate.  7. The aortic valve has been  repaired/replaced. Aortic valve regurgitation is mild. Aortic valve sclerosis/calcification is present, without any evidence of aortic stenosis. There is a valve present in the aortic position. Procedure Date: 5 years ago. Conclusion(s)/Recommendation(s): No evidence of vegetation/infective endocarditis on this transesophageael echocardiogram. FINDINGS  Left Ventricle: Left ventricular ejection fraction, by estimation, is 40 to 45%. The left ventricle has mildly decreased function. The left ventricle demonstrates global hypokinesis. The left ventricular internal cavity size was mildly dilated. There is  mild left ventricular hypertrophy. Right Ventricle: The right ventricular size is mildly enlarged. Mildly increased right ventricular wall thickness. Right ventricular systolic function is mildly reduced. Left Atrium: Smoke present. Left atrial size was moderately dilated. Spontaneous echo contrast was present. No left atrial/left atrial appendage thrombus was detected. Right Atrium: Right atrial size was moderately dilated. Pericardium: There is no evidence of pericardial effusion. Mitral Valve: The mitral valve is normal in structure. Mild to moderate mitral valve regurgitation. Tricuspid Valve: The tricuspid valve is normal in structure. Tricuspid valve regurgitation is mild to moderate. Aortic Valve: The aortic valve has been repaired/replaced. Aortic valve regurgitation is mild. Aortic valve sclerosis/calcification is present, without any evidence of aortic stenosis. There is a valve present in the aortic position. Procedure Date: 5 years ago. Pulmonic Valve: The pulmonic valve was grossly normal. Pulmonic valve regurgitation is mild to moderate. Aorta: The aortic root, ascending aorta and aortic arch are all structurally normal, with no evidence of dilitation or obstruction. IAS/Shunts: No atrial level shunt detected by color flow Doppler. [employer] Electronically signed by Denyse Bathe Signature Date/Time:  04/26/2023/9:57:04 AM    Final    @IMAGES @  Assessment: Positive Streptococcus gallolyticus blood culture-this may suggest possible colonic neoplasm given the species being closely related to that of Streptococcus bovis another marker for possible colon cancer.  Patient has not had  a colonoscopy in approximately 20 years.  CKD stage IV-this would be a contraindication to elective CT colonography which would have been preferred to sedated procedure given the patient's advanced lung and kidney disease. Diastolic CHF Severe COPD - on oxygen  nasal cannula. Sats 90-96.    Recommendations: I had a long discussion with the patient regarding his increased risk of undergoing endoluminal evaluation.  That being said, it appeared imperative to at least investigate the cause of the positive Streptococcus gallolyticus to rule out colon malignancy.  Patient has decided he would like to undergo the procedure for diagnostic purposes. Will pursue colonoscopy as an inpatient secondary to need to observe patient after procedure. The patient understands the nature of the planned procedure, indications, risks, alternatives and potential complications including but not limited to bleeding, infection, perforation, damage to internal organs and possible oversedation/side effects from anesthesia. The patient agrees and gives consent to proceed.  Please refer to procedure notes for findings, recommendations and patient disposition/instructions. Dr. Josette made aware of plans.  Thank you for the consult. Please call with questions or concerns.  Aundria Ladell Eck MD [facility] [address] [place], [place] [address] [phone]  04/27/2023 2:37 PM

## 2023-04-28 ENCOUNTER — Encounter
Admission: EM | Disposition: A | Discharge status: Home / Self Care | Payer: Self-pay | Source: Home / Self Care | Attending: Internal Medicine

## 2023-04-28 ENCOUNTER — Inpatient Hospital Stay: Payer: Self-pay | Admitting: Anesthesiology

## 2023-04-28 ENCOUNTER — Encounter: Payer: Self-pay | Admitting: Internal Medicine

## 2023-04-28 DIAGNOSIS — R7881 Bacteremia: Secondary | ICD-10-CM | POA: Diagnosis not present

## 2023-04-28 DIAGNOSIS — N184 Chronic kidney disease, stage 4 (severe): Secondary | ICD-10-CM | POA: Diagnosis not present

## 2023-04-28 DIAGNOSIS — J441 Chronic obstructive pulmonary disease with (acute) exacerbation: Secondary | ICD-10-CM | POA: Diagnosis not present

## 2023-04-28 DIAGNOSIS — J9621 Acute and chronic respiratory failure with hypoxia: Secondary | ICD-10-CM | POA: Diagnosis not present

## 2023-04-28 HISTORY — PX: POLYPECTOMY: SHX5525

## 2023-04-28 HISTORY — PX: COLONOSCOPY: SHX5424

## 2023-04-28 LAB — CBC
HCT: 31.2 % — ABNORMAL LOW (ref 39.0–52.0)
Hemoglobin: 10.6 g/dL — ABNORMAL LOW (ref 13.0–17.0)
MCH: 31.7 pg (ref 26.0–34.0)
MCHC: 34 g/dL (ref 30.0–36.0)
MCV: 93.4 fL (ref 80.0–100.0)
Platelets: 169 10*3/uL (ref 150–400)
RBC: 3.34 MIL/uL — ABNORMAL LOW (ref 4.22–5.81)
RDW: 13.3 % (ref 11.5–15.5)
WBC: 10.2 10*3/uL (ref 4.0–10.5)
nRBC: 0 % (ref 0.0–0.2)

## 2023-04-28 LAB — BASIC METABOLIC PANEL
Anion gap: 10 (ref 5–15)
BUN: 82 mg/dL — ABNORMAL HIGH (ref 8–23)
CO2: 20 mmol/L — ABNORMAL LOW (ref 22–32)
Calcium: 8.5 mg/dL — ABNORMAL LOW (ref 8.9–10.3)
Chloride: 108 mmol/L (ref 98–111)
Creatinine, Ser: 3.15 mg/dL — ABNORMAL HIGH (ref 0.61–1.24)
GFR, Estimated: 19 mL/min — ABNORMAL LOW (ref >60)
Glucose, Bld: 80 mg/dL (ref 70–99)
Potassium: 4.8 mmol/L (ref 3.5–5.1)
Sodium: 138 mmol/L (ref 135–145)

## 2023-04-28 LAB — GLUCOSE, CAPILLARY
Glucose-Capillary: 81 mg/dL (ref 70–99)
Glucose-Capillary: 139 mg/dL — ABNORMAL HIGH (ref 70–99)

## 2023-04-28 SURGERY — COLONOSCOPY
Anesthesia: General

## 2023-04-28 MED ORDER — PROPOFOL 10 MG/ML IV BOLUS
INTRAVENOUS | Status: AC
  Filled 2023-04-28: qty 40

## 2023-04-28 MED ORDER — PROPOFOL 10 MG/ML IV BOLUS
INTRAVENOUS | Status: DC | PRN
  Administered 2023-04-28 (×6): 30 mg via INTRAVENOUS
  Administered 2023-04-28: 60 mg via INTRAVENOUS
  Administered 2023-04-28: 30 mg via INTRAVENOUS

## 2023-04-28 MED ORDER — METHYLPREDNISOLONE SODIUM SUCC 40 MG IJ SOLR: 40.0000 mg | Freq: Once | INTRAMUSCULAR | Status: DC

## 2023-04-28 MED ORDER — FLUTICASONE FUROATE-VILANTEROL 100-25 MCG/ACT IN AEPB
1.0000 | INHALATION_SPRAY | Freq: Every day | RESPIRATORY_TRACT | Status: DC
  Administered 2023-04-28: 1 via RESPIRATORY_TRACT
  Filled 2023-04-28: qty 28

## 2023-04-28 MED ORDER — AMOXICILLIN 500 MG PO CAPS: 1000.0000 mg | ORAL_CAPSULE | Freq: Two times a day (BID) | ORAL | 0 refills | Status: AC

## 2023-04-28 MED ORDER — METHYLPREDNISOLONE SODIUM SUCC 40 MG IJ SOLR
40.0000 mg | Freq: Once | INTRAMUSCULAR | Status: AC
  Administered 2023-04-28: 40 mg via INTRAVENOUS
  Filled 2023-04-28: qty 1

## 2023-04-28 MED ORDER — IPRATROPIUM-ALBUTEROL 0.5-2.5 (3) MG/3ML IN SOLN
3.0000 mL | Freq: Three times a day (TID) | RESPIRATORY_TRACT | Status: DC
  Administered 2023-04-28: 3 mL via RESPIRATORY_TRACT
  Filled 2023-04-28: qty 3

## 2023-04-28 MED ORDER — EPHEDRINE 5 MG/ML INJ
INTRAVENOUS | Status: AC
  Filled 2023-04-28: qty 5

## 2023-04-28 MED ORDER — EPHEDRINE SULFATE-NACL 50-0.9 MG/10ML-% IV SOSY
PREFILLED_SYRINGE | INTRAVENOUS | Status: DC | PRN
  Administered 2023-04-28: 10 mg via INTRAVENOUS
  Administered 2023-04-28 (×2): 5 mg via INTRAVENOUS

## 2023-04-28 MED ORDER — IPRATROPIUM-ALBUTEROL 0.5-2.5 (3) MG/3ML IN SOLN
RESPIRATORY_TRACT | Status: AC
  Filled 2023-04-28: qty 3

## 2023-04-28 MED ORDER — SODIUM CHLORIDE 0.9 % IV SOLN: INTRAVENOUS | Status: DC

## 2023-04-28 MED ORDER — PHENYLEPHRINE 80 MCG/ML (10ML) SYRINGE FOR IV PUSH (FOR BLOOD PRESSURE SUPPORT)
PREFILLED_SYRINGE | INTRAVENOUS | Status: DC | PRN
  Administered 2023-04-28 (×2): 160 ug via INTRAVENOUS

## 2023-04-28 MED ORDER — PHENYLEPHRINE 80 MCG/ML (10ML) SYRINGE FOR IV PUSH (FOR BLOOD PRESSURE SUPPORT)
PREFILLED_SYRINGE | INTRAVENOUS | Status: AC
  Filled 2023-04-28: qty 10

## 2023-04-28 MED ORDER — UMECLIDINIUM BROMIDE 62.5 MCG/ACT IN AEPB
1.0000 | INHALATION_SPRAY | Freq: Every day | RESPIRATORY_TRACT | Status: DC
  Administered 2023-04-28: 1 via RESPIRATORY_TRACT
  Filled 2023-04-28: qty 7

## 2023-04-28 NOTE — Plan of Care (Signed)
  Problem: Education: Goal: Ability to describe self-care measures that may prevent or decrease complications (Diabetes Survival Skills Education) will improve Outcome: Progressing Goal: Individualized Educational Video(s) Outcome: Progressing   Problem: Coping: Goal: Ability to adjust to condition or change in health will improve Outcome: Progressing   Problem: Fluid Volume: Goal: Ability to maintain a balanced intake and output will improve Outcome: Progressing   Problem: Health Behavior/Discharge Planning: Goal: Ability to identify and utilize available resources and services will improve Outcome: Progressing Goal: Ability to manage health-related needs will improve Outcome: Progressing   Problem: Metabolic: Goal: Ability to maintain appropriate glucose levels will improve Outcome: Progressing   Problem: Nutritional: Goal: Maintenance of adequate nutrition will improve Outcome: Progressing Goal: Progress toward achieving an optimal weight will improve Outcome: Progressing   Problem: Skin Integrity: Goal: Risk for impaired skin integrity will decrease Outcome: Progressing   Problem: Tissue Perfusion: Goal: Adequacy of tissue perfusion will improve Outcome: Progressing   Problem: Education: Goal: Knowledge of General Education information will improve Description: Including pain rating scale, medication(s)/side effects and non-pharmacologic comfort measures Outcome: Progressing   Problem: Health Behavior/Discharge Planning: Goal: Ability to manage health-related needs will improve Outcome: Progressing   Problem: Clinical Measurements: Goal: Ability to maintain clinical measurements within normal limits will improve Outcome: Progressing Goal: Will remain free from infection Outcome: Progressing Goal: Diagnostic test results will improve Outcome: Progressing Goal: Respiratory complications will improve Outcome: Progressing Goal: Cardiovascular complication will  be avoided Outcome: Progressing   Problem: Activity: Goal: Risk for activity intolerance will decrease Outcome: Progressing   Problem: Nutrition: Goal: Adequate nutrition will be maintained Outcome: Progressing   Problem: Coping: Goal: Level of anxiety will decrease Outcome: Progressing   Problem: Elimination: Goal: Will not experience complications related to bowel motility Outcome: Progressing Goal: Will not experience complications related to urinary retention Outcome: Progressing   Problem: Pain Management: Goal: General experience of comfort will improve Outcome: Progressing   Problem: Safety: Goal: Ability to remain free from injury will improve Outcome: Progressing   Problem: Skin Integrity: Goal: Risk for impaired skin integrity will decrease Outcome: Progressing   Problem: Education: Goal: Knowledge of disease or condition will improve Outcome: Progressing Goal: Knowledge of the prescribed therapeutic regimen will improve Outcome: Progressing Goal: Individualized Educational Video(s) Outcome: Progressing   Problem: Activity: Goal: Ability to tolerate increased activity will improve Outcome: Progressing Goal: Will verbalize the importance of balancing activity with adequate rest periods Outcome: Progressing   Problem: Respiratory: Goal: Ability to maintain a clear airway will improve Outcome: Progressing Goal: Levels of oxygenation will improve Outcome: Progressing Goal: Ability to maintain adequate ventilation will improve Outcome: Progressing

## 2023-04-28 NOTE — Anesthesia Preprocedure Evaluation (Addendum)
 Anesthesia Evaluation  Patient identified by MRN, date of birth, ID band Patient awake    Reviewed: Allergy & Precautions, H&P , NPO status , Patient's Chart, lab work & pertinent test results, reviewed documented beta blocker date and time   Airway Mallampati: IV  TM Distance: <3 FB Neck ROM: Full    Dental  (+) Partial Upper, Chipped, Missing,    Pulmonary sleep apnea and Continuous Positive Airway Pressure Ventilation , pneumonia, COPD,  oxygen  dependent, former smoker sleep apnea , pneumonia, COPD,  oxygen  dependent, former smoker  reports that he quit smoking about 14 years ago. His smoking use included cigarettes. He has a 108.00 pack-year smoking history. He quit smokeless tobacco use about 18 years ago.  His smokeless tobacco use included chew.   Patient wears oxygen  at night continuous and as needed during the day   [place] in place   + decreased breath sounds      Cardiovascular Exercise Tolerance: Poor METS: < 3 Mets hypertension, Pt. on home beta blockers and Pt. 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 In 2022, the  Left ventricular ejection fraction, by estimation, is 50 to 55%. (Has dropped to 38% in 2024)    The left ventricle has low normal function. The left ventricle demonstrates  global hypokinesis. There is mild left ventricular hypertrophy. Left  ventricular diastolic parameters are  consistent with Grade I diastolic dysfunction (impaired relaxation).   2. Right ventricular systolic function is normal. The right ventricular  size is normal.   3. Left atrial size was mildly dilated.   4. Right atrial size was mildly dilated.   5. The mitral valve was not well visualized. Trivial mitral valve  regurgitation. Moderate  mitral annular calcification.   6. The aortic valve was not well visualized. Aortic valve regurgitation  is trivial.    09-05-22            Coronary artery disease - Primary                          Stress test 08/2022 Ischemia in the RCA territory with borderline LVEF dysfunction, advise CCTA. Kidney function decreased, unable to do CCTA. Patient denies chest pain. Will treat medically.                              echo 08/12/22 EF 38%. Patient complaining of worsening shortness of breath. Unable to change losartan  to Entresto  due to decreased kidney function.    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, did not see that in epic         Neuro/Psych  Headaches PSYCHIATRIC DISORDERS Anxiety Depression    negative neurological ROS     GI/Hepatic Neg liver ROS, hiatal hernia,GERD  ,,  Endo/Other  diabetesHypothyroidism    Renal/GU Renal diseaseCKD (chronic kidney disease), stage IV  negative genitourinary   Musculoskeletal negative musculoskeletal ROS (+) Arthritis ,    Abdominal  (+) + obese  Peds negative pediatric ROS (+)  Hematology  (+) Blood dyscrasia, anemia   Anesthesia Other Findings T2DM (type 2 diabetes mellitus) (HCC) Hypertension Kidney stones  COPD  Basal cell carcinoma  Squamous cell carcinoma of skin Pneumonia  Heart murmur OSA on  CPAP DOE (dyspnea on exertion) History of hiatal hernia  Migraines Arthritis  Hyperkalemia CAD (coronary artery disease) Aortic stenosis, 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)  CKD (chronic kidney disease), stage IV  HLD (hyperlipidemia)  HFrEF (heart failure with reduced ejection fraction) (HCC) Supplemental oxygen  dependent GERD (gastroesophageal reflux disease) BCC (basal cell carcinoma of skin) Squamous cell carcinoma of skin Respiratory failure (HCC)  Acute renal failure (ARF) (HCC) CAP (community acquired pneumonia) AKI (acute kidney injury) (HCC) DM (diabetes  mellitus), type 2 (HCC) Accidental cut, puncture, perforation, or hemorrhage during heart catheterization Acute postoperative pain  ANA positive Benign hypertensive kidney disease with chronic kidney disease  Complete tear of right rotator cuff Complex tear of medial meniscus of left knee as current injury  Personal history of other malignant neoplasm of skin Recurrent nephrolithiasis  Rotator cuff tendinitis, right S/P TAVR (transcatheter aortic valve replacement)  Complex tear of lateral meniscus of left knee as current injury Grade I diastolic dysfunction  Left atrial dilation Right atrial dilation  Gout Hypothyroidism  Supplemental oxygen  dependent    Reproductive/Obstetrics negative OB ROS                             Anesthesia Physical Anesthesia Plan  ASA: 4  Anesthesia Plan: General   Post-op Pain Management: Minimal or no pain anticipated   Induction: Intravenous  PONV Risk Score and Plan: 2 and Propofol  infusion, TIVA, Ondansetron  and Treatment may vary due to age or medical condition  Airway Management Planned: Natural Airway and Simple Face Mask  Additional Equipment: None  Intra-op Plan:   Post-operative Plan:   Informed Consent: I have reviewed the patients History and Physical, chart, labs and discussed the procedure including the risks, benefits and alternatives for the proposed anesthesia with the patient or authorized representative who has indicated his/her understanding and acceptance.     Dental Advisory Given  Plan Discussed with: Anesthesiologist, CRNA and Surgeon  Anesthesia Plan Comments:         Anesthesia Quick Evaluation

## 2023-04-28 NOTE — Transfer of Care (Signed)
 Immediate Anesthesia Transfer of Care Note  Patient: [name] [name]  Procedure(s) Performed: COLONOSCOPY  Patient Location: Endoscopy Unit  Anesthesia Type:General  Level of Consciousness: awake, alert , and oriented  Airway & Oxygen  Therapy: Patient Spontanous Breathing and Patient connected to nasal cannula oxygen   Post-op Assessment: Report given to RN, Post -op Vital signs reviewed and stable, and Patient moving all extremities  Post vital signs: Reviewed and stable  Last Vitals:  Vitals Value Taken Time  BP 194/81 04/28/23 0930  Temp    Pulse 85 04/28/23 0931  Resp 22 04/28/23 0931  SpO2 100 % 04/28/23 0931  Vitals shown include unfiled device data.  Last Pain:  Vitals:   04/28/23 0920  TempSrc:   PainSc: 0-No pain         Complications: No notable events documented.

## 2023-04-28 NOTE — Progress Notes (Signed)
 PRN duoneb was administered per order due to expiratory wheeze post procedure. Improvement noted after treatment. [name] [name] also stated "that feels better." Report given to shift RN prior to transport back to room 107.

## 2023-04-28 NOTE — Discharge Summary (Signed)
 Physician Discharge Summary   Patient: [name] [name] MRN: [id number] DOB: Mar 31, 1946  Admit date:     04/21/2023  Discharge date: 04/28/23  Discharge Physician: Charlie Patterson   PCP: Orlean Alan HERO, FNP   Recommendations at discharge:   Follow-up PCP 5 days Follow-up infectious disease specialist 4 weeks Will need BMP with follow-up appointment.  2 weeks after completion of antibiotics will need blood cultures.  4 weeks after completion of antibiotics will need blood cultures.  Discharge Diagnoses: Principal Problem:   Bacteremia due to Streptococcus Active Problems:   Acute on chronic respiratory failure with hypoxia (HCC)   COPD with acute exacerbation (HCC)   CKD (chronic kidney disease), stage IV (HCC)   Acute on chronic combined systolic and diastolic CHF (congestive heart failure) (HCC)   Essential hypertension   Dyslipidemia   Type 2 diabetes mellitus with chronic kidney disease, without long-term current use of insulin  (HCC)   Hyperkalemia   BPH (benign prostatic hyperplasia)   Gout   Anxiety and depression   GERD without esophagitis   Iron deficiency anemia    Hospital Course: 78 year old man past medical history of congestive heart failure, CAD, GERD, gout, chronic oxygen .  Dyslipidemia, obstructive sleep apnea, stage IV chronic kidney disease presented to the emergency room with worsening shortness of breath, fever.  Patient was found to have Streptococcus gallolyticus and blood cultures.  Echocardiogram does not show any endocarditis but its moderate mitral regurgitation.  1/1.  Continue IV Rocephin .  TEE will need to be done to determine length of treatment. 1/2.  TEE negative for endocarditis.  Plan for potential discharge tomorrow after IV Rocephin  dose.  Will be switched over to amoxicillin .  Will need repeat blood cultures 2 weeks and 4 weeks after. 1/3.  Case discussed with Dr. Aundria gastroenterology and he came and saw the patient decided to  do colonoscopy while he is here.  Case discussed with infectious disease specialist and the Streptococcus is sensitive to amoxicillin  and recommends another 9 days of amoxicillin  when we do discharge him. 1/4.  Colonoscopy done showing multiple polyps that were removed.  Patient given nebulizer treatments before and after colonoscopy.  Patient feels okay and wants to go home.  On reevaluation this afternoon lungs did not have any wheezing and patient was moving good air.  Advised patient to hold aspirin  for few days after colonoscopy.  Assessment and Plan: * Bacteremia due to Streptococcus Streptococcus gallolyticus growing out of initial blood cultures.  Continue IV Rocephin  while here and switch over to amoxicillin  upon discharge for 8 days.  TEE negative.  Repeat blood cultures drawn on 12/31 negative so far.  As per ID, repeat blood cultures after antibiotic course in 2 weeks and in 4 weeks.  Streptococcus gallolyticus can come from a colon cancer and Dr. Aundria did a colonoscopy on 1/4 and removed polyps and took biopsies.  No mass seen on colonoscopy.  Acute on chronic respiratory failure with hypoxia (HCC) Patient wears oxygen  at night continuous and as needed during the day.  Continue bronchodilators.   COPD with acute exacerbation (HCC) Continue bronchodilators.  Given a dose of Solu-Medrol  on the day of discharge and completed his therapy for COPD exacerbation.  CKD (chronic kidney disease), stage IV (HCC) Last creatinine 3.15 with a GFR 19  Acute on chronic combined systolic and diastolic CHF (congestive heart failure) (HCC) Last EF 50% with moderate mitral regurgitation.  Diuretics on hold during the hospital course, can restart as outpatient..  On  Toprol .  Dyslipidemia Continue with Zetia  and outpatient Repatha (hold while on antibiotics).  Essential hypertension Patient on Toprol -XL  Type 2 diabetes mellitus with chronic kidney disease, without long-term current use of insulin   (HCC) Last hemoglobin A1c a few months ago 5.5  Iron deficiency anemia Hemoglobin stable at 10.6  GERD without esophagitis PPI therapy.  Anxiety and depression Continue BuSpar  and citalopram .  Gout Continue his Uloric .  Holding colchicine  in hospital.  BPH (benign prostatic hyperplasia) Continue Flomax .  Hyperkalemia Resolved         Consultants: Infectious disease, nephrology, gastroenterology, cardiology Procedures performed: Colonoscopy, TEE Disposition: Home Diet recommendation:  Cardiac diet DISCHARGE MEDICATION: Allergies as of 04/28/2023       Reactions   Nsaids Other (See Comments)   Avoid due to kidney disease    Statins Other (See Comments)   Myalgias   Nexletol [bempedoic Acid] Diarrhea   fatigue   Silver Other (See Comments)   Tegaderm High Gelling Alginate [wound Dressings] Rash        Medication List     STOP taking these medications    aspirin  EC 81 MG tablet   ipratropium-albuterol  0.5-2.5 (3) MG/3ML Soln Commonly known as: DUONEB   Repatha SureClick 140 MG/ML Soaj Generic drug: Evolocumab   sucralfate  1 g tablet Commonly known as: Carafate        TAKE these medications    acetaminophen  650 MG CR tablet Commonly known as: TYLENOL  Take 1,300 mg by mouth every 8 (eight) hours as needed for pain.   albuterol  108 (90 Base) MCG/ACT inhaler Commonly known as: VENTOLIN  HFA Inhale 2 puffs into the lungs every 6 (six) hours as needed for wheezing or shortness of breath.   amoxicillin  500 MG capsule Commonly known as: AMOXIL  Take 2 capsules (1,000 mg total) by mouth 2 (two) times daily for 8 days. Start taking on: April 29, 2023   busPIRone  5 MG tablet Commonly known as: BUSPAR  TAKE 1 TABLET BY MOUTH TWICE DAILY AS NEEDED   calcitRIOL  0.25 MCG capsule Commonly known as: ROCALTROL  Take 0.25 mcg by mouth daily.   citalopram  40 MG tablet Commonly known as: CELEXA  TAKE 1 TABLET EVERY DAY   colchicine  0.6 MG tablet Take 0.3  mg by mouth daily.   DropSafe Alcohol Prep 70 % Pads Apply 1 each topically as needed (With repatha and blood sugar checks.).   ezetimibe  10 MG tablet Commonly known as: ZETIA  TAKE 1 TABLET(10 MG) BY MOUTH AT BEDTIME   febuxostat  40 MG tablet Commonly known as: ULORIC  Take 40 mg by mouth daily.   fexofenadine 180 MG tablet Commonly known as: ALLEGRA Take 180 mg by mouth daily as needed for allergies or rhinitis.   gabapentin  400 MG capsule Commonly known as: NEURONTIN  TAKE 1 CAPSULE AT BEDTIME   gabapentin  100 MG capsule Commonly known as: NEURONTIN  TAKE 2 CAPSULES TWICE DAILY   GAS-X PO Take 1 tablet by mouth 2 (two) times daily as needed (flatulence).   metoprolol  succinate 50 MG 24 hr tablet Commonly known as: TOPROL -XL TAKE 1 TABLET EVERY DAY   ondansetron  4 MG disintegrating tablet Commonly known as: ZOFRAN -ODT Take 1 tablet (4 mg total) by mouth every 8 (eight) hours as needed for nausea or vomiting.   OXYGEN  Inhale 3 L into the lungs See admin instructions. Used as needed throughout the day and continuous at bedtime   pantoprazole  40 MG tablet Commonly known as: PROTONIX  Take 40 mg by mouth every morning.   PreserVision AREDS Caps Take  1 capsule by mouth 2 (two) times daily.   sodium chloride  0.65 % Soln nasal spray Commonly known as: OCEAN Place 1 spray into both nostrils as needed for congestion.   SUPER B COMPLEX/C PO Take 1 tablet by mouth daily.   tamsulosin  0.4 MG Caps capsule Commonly known as: FLOMAX  TAKE 1 CAPSULE EVERY DAY   torsemide  20 MG tablet Commonly known as: DEMADEX  Take 20 mg by mouth daily.   Trelegy Ellipta  100-62.5-25 MCG/ACT Aepb Generic drug: Fluticasone -Umeclidin-Vilant Inhale 1 Inhalation into the lungs daily at 6 (six) AM. What changed:  when to take this reasons to take this   vitamin C  1000 MG tablet Take 1,000 mg by mouth at bedtime.        Follow-up Information     Orlean Alan HERO, FNP Follow up in 5  day(s).   Specialty: Family Medicine Why: Hospital follow up Contact information: [address] [place] [address] [phone]         Fayette Bodily, MD Follow up in 4 week(s).   Specialty: Infectious Diseases Contact information: [address] [place] [address] [phone]                Discharge Exam: Fredricka Weights   04/22/23 0132 04/24/23 2052  Weight: 90.3 kg 89.3 kg   Physical Exam HENT:     Head: Normocephalic.     Mouth/Throat:     Pharynx: No oropharyngeal exudate.  Eyes:     General: Lids are normal.     Conjunctiva/sclera: Conjunctivae normal.  Cardiovascular:     Rate and Rhythm: Normal rate and regular rhythm.     Heart sounds: Normal heart sounds, S1 normal and S2 normal.  Pulmonary:     Breath sounds: Examination of the right-lower field reveals decreased breath sounds. Examination of the left-lower field reveals decreased breath sounds. Decreased breath sounds present. No wheezing, rhonchi or rales.  Abdominal:     Palpations: Abdomen is soft.     Tenderness: There is no abdominal tenderness.  Musculoskeletal:     Right lower leg: Swelling present.     Left lower leg: No swelling.  Skin:    General: Skin is warm.     Findings: No rash.  Neurological:     Mental Status: He is alert and oriented to person, place, and time.      Condition at discharge: stable  The results of significant diagnostics from this hospitalization (including imaging, microbiology, ancillary and laboratory) are listed below for reference.   Imaging Studies: ECHO TEE Result Date: 04/26/2023    TRANSESOPHOGEAL ECHO REPORT   Patient Name:   HARRELL NIEHOFF Date of Exam: 04/26/2023 Medical Rec #:  [id number]               Height:       63.0 in Accession #:    [id number]              Weight:       196.9 lb Date of Birth:  July 10, 1945              BSA:          1.921 m Patient Age:    77 years                BP:           177/79 mmHg Patient  Gender: M  HR:           72 bpm. Exam Location:  [facility] Procedure: Transesophageal Echo, Cardiac Doppler and Color Doppler Indications:     Endocarditis  History:         Patient has prior history of Echocardiogram examinations, most                  recent 04/24/2023. COPD; Risk Factors:Diabetes.                  Aortic Valve: valve is present in the aortic position.                  Procedure Date: 5 years ago.  Sonographer:     Christopher Furnace Referring Phys:  [id number] [facility] A KHAN Diagnosing Phys: Denyse Bathe PROCEDURE: The transesophogeal probe was passed without difficulty through the esophogus of the patient. Sedation performed by different physician. The patient developed no complications during the procedure.  IMPRESSIONS  1. Left ventricular ejection fraction, by estimation, is 40 to 45%. The left ventricle has mildly decreased function. The left ventricle demonstrates global hypokinesis. The left ventricular internal cavity size was mildly dilated. There is mild left ventricular hypertrophy.  2. Right ventricular systolic function is mildly reduced. The right ventricular size is mildly enlarged. Mildly increased right ventricular wall thickness.  3. Smoke present. Left atrial size was moderately dilated. No left atrial/left atrial appendage thrombus was detected.  4. Right atrial size was moderately dilated.  5. The mitral valve is normal in structure. Mild to moderate mitral valve regurgitation.  6. Tricuspid valve regurgitation is mild to moderate.  7. The aortic valve has been repaired/replaced. Aortic valve regurgitation is mild. Aortic valve sclerosis/calcification is present, without any evidence of aortic stenosis. There is a valve present in the aortic position. Procedure Date: 5 years ago. Conclusion(s)/Recommendation(s): No evidence of vegetation/infective endocarditis on this transesophageael echocardiogram. FINDINGS  Left Ventricle: Left ventricular ejection fraction, by  estimation, is 40 to 45%. The left ventricle has mildly decreased function. The left ventricle demonstrates global hypokinesis. The left ventricular internal cavity size was mildly dilated. There is  mild left ventricular hypertrophy. Right Ventricle: The right ventricular size is mildly enlarged. Mildly increased right ventricular wall thickness. Right ventricular systolic function is mildly reduced. Left Atrium: Smoke present. Left atrial size was moderately dilated. Spontaneous echo contrast was present. No left atrial/left atrial appendage thrombus was detected. Right Atrium: Right atrial size was moderately dilated. Pericardium: There is no evidence of pericardial effusion. Mitral Valve: The mitral valve is normal in structure. Mild to moderate mitral valve regurgitation. Tricuspid Valve: The tricuspid valve is normal in structure. Tricuspid valve regurgitation is mild to moderate. Aortic Valve: The aortic valve has been repaired/replaced. Aortic valve regurgitation is mild. Aortic valve sclerosis/calcification is present, without any evidence of aortic stenosis. There is a valve present in the aortic position. Procedure Date: 5 years ago. Pulmonic Valve: The pulmonic valve was grossly normal. Pulmonic valve regurgitation is mild to moderate. Aorta: The aortic root, ascending aorta and aortic arch are all structurally normal, with no evidence of dilitation or obstruction. IAS/Shunts: No atrial level shunt detected by color flow Doppler. Denyse Bathe Electronically signed by Denyse Bathe Signature Date/Time: 04/26/2023/9:57:04 AM    Final    ECHOCARDIOGRAM COMPLETE Result Date: 04/24/2023    ECHOCARDIOGRAM REPORT   Patient Name:   JACK BOLIO Date of Exam: 04/24/2023 Medical Rec #:  [id number]  Height:       63.0 in Accession #:    [id number]              Weight:       199.1 lb Date of Birth:  09-04-1945              BSA:          1.930 m Patient Age:    77 years                BP:            166/72 mmHg Patient Gender: M                       HR:           70 bpm. Exam Location:  [facility] Procedure: 2D Echo, Cardiac Doppler and Color Doppler Indications:     Bacteremia  History:         Patient has prior history of Echocardiogram examinations, most                  recent 07/28/2020. Signs/Symptoms:Dyspnea; Risk                  Factors:Hypertension and Diabetes. 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 Valves replaced at [facility] in 2017 per                  patient.  Sonographer:     Naomie Reef Referring Phys:  [id number] DONALD BERLIN Diagnosing Phys: Redell Cave MD IMPRESSIONS  1. Left ventricular ejection fraction, by estimation, is 50%. The left ventricle has low normal function. The left ventricle has no regional wall motion abnormalities. There is mild left ventricular hypertrophy. Left ventricular diastolic parameters are  indeterminate.  2. Right ventricular systolic function is low normal. The right ventricular size is normal. There is mildly elevated pulmonary artery systolic pressure.  3. Left atrial size was severely dilated.  4. The mitral valve is degenerative. Mild to moderate mitral valve regurgitation. Moderate mitral stenosis. The mean mitral valve gradient is 7.5 mmHg. Moderate mitral annular calcification.  5. The aortic valve has been repaired/replaced. Aortic valve regurgitation is not visualized. Aortic valve mean gradient measures 12.0 mmHg.  6. Aortic dilatation noted. There is mild dilatation of the aortic root, measuring 41 mm. FINDINGS  Left Ventricle: Left ventricular ejection fraction, by estimation, is 50%. The left ventricle has low normal function. The left ventricle has no regional wall motion abnormalities. The left ventricular internal cavity size was normal in size. There is mild left ventricular hypertrophy. Left ventricular diastolic parameters are  indeterminate. Right Ventricle: The right ventricular size is normal. No increase in right ventricular wall thickness. Right ventricular systolic function is low normal. There is mildly elevated pulmonary artery systolic pressure. The tricuspid regurgitant velocity is 2.79 m/s, and with an assumed right atrial pressure of 8 mmHg, the estimated right ventricular systolic pressure is 39.1 mmHg. Left Atrium: Left atrial size was severely dilated. Right Atrium: Right atrial size was normal in size. Pericardium: There is no evidence of pericardial effusion. Mitral Valve: The mitral valve is degenerative in appearance. Moderate mitral annular calcification. Mild to moderate mitral valve  regurgitation. Moderate mitral valve stenosis. MV peak gradient, 18.1 mmHg. The mean mitral valve gradient is 7.5 mmHg. Tricuspid Valve: The tricuspid valve is normal in structure. Tricuspid valve regurgitation is mild. Aortic Valve: The aortic valve has been repaired/replaced. Aortic valve regurgitation is not visualized. Aortic valve mean gradient measures 12.0 mmHg. Aortic valve peak gradient measures 21.1 mmHg. Aortic valve area, by VTI measures 1.76 cm. Pulmonic Valve: The pulmonic valve was not well visualized. Pulmonic valve regurgitation is not visualized. Aorta: Aortic dilatation noted. There is mild dilatation of the aortic root, measuring 41 mm. Venous: The inferior vena cava was not well visualized. IAS/Shunts: No atrial level shunt detected by color flow Doppler.  LEFT VENTRICLE PLAX 2D LVIDd:         5.50 cm      Diastology LVIDs:         4.00 cm      LV e' lateral:   6.74 cm/s LV PW:         1.30 cm      LV E/e' lateral: 24.6 LV IVS:        1.40 cm LVOT diam:     2.00 cm LV SV:         102 LV SV Index:   53 LVOT Area:     3.14 cm  LV Volumes (MOD) LV vol d, MOD A2C: 113.0 ml LV vol d, MOD A4C: 91.9 ml LV vol s, MOD A2C: 59.1 ml LV vol s, MOD A4C: 51.3 ml LV SV MOD A2C:     53.9 ml LV SV MOD A4C:     91.9 ml LV SV MOD BP:       44.7 ml RIGHT VENTRICLE RV Basal diam:  3.60 cm RV Mid diam:    2.90 cm RV S prime:     12.70 cm/s TAPSE (M-mode): 2.1 cm LEFT ATRIUM              Index        RIGHT ATRIUM           Index LA diam:        4.90 cm  2.54 cm/m   RA Area:     17.10 cm LA Vol (A2C):   137.0 ml 70.99 ml/m  RA Volume:   46.10 ml  23.89 ml/m LA Vol (A4C):   122.0 ml 63.22 ml/m LA Biplane Vol: 134.0 ml 69.44 ml/m  AORTIC VALVE                     PULMONIC VALVE AV Area (Vmax):    1.83 cm      PV Vmax:       1.07 m/s AV Area (Vmean):   1.71 cm      PV Peak grad:  4.6 mmHg AV Area (VTI):     1.76 cm AV Vmax:           229.50 cm/s AV Vmean:          161.000 cm/s AV VTI:            0.579 m AV Peak Grad:      21.1 mmHg AV Mean Grad:      12.0 mmHg LVOT Vmax:         134.00 cm/s LVOT Vmean:        87.600 cm/s LVOT VTI:          0.325 m LVOT/AV VTI ratio: 0.56  AORTA Ao Root diam: 4.10 cm MITRAL  VALVE                  TRICUSPID VALVE MV Area (PHT): 4.60 cm       TR Peak grad:   31.1 mmHg MV Area VTI:   1.86 cm       TR Vmax:        279.00 cm/s MV Peak grad:  18.1 mmHg MV Mean grad:  7.5 mmHg       SHUNTS MV Vmax:       2.12 m/s       Systemic VTI:  0.32 m MV Vmean:      129.0 cm/s     Systemic Diam: 2.00 cm MV Decel Time: 165 msec MR Peak grad:    136.0 mmHg MR Mean grad:    102.0 mmHg MR Vmax:         583.00 cm/s MR Vmean:        485.0 cm/s MR PISA:         0.57 cm MR PISA Eff ROA: 9 mm MR PISA Radius:  0.30 cm MV E velocity: 166.00 cm/s MV A velocity: 178.00 cm/s MV E/A ratio:  0.93 Redell Cave MD Electronically signed by Redell Cave MD Signature Date/Time: 04/24/2023/2:16:38 PM    Final    DG Chest Port 1 View Result Date: 04/22/2023 CLINICAL DATA:  Possible sepsis EXAM: PORTABLE CHEST 1 VIEW COMPARISON:  09/06/2022 FINDINGS: Cardiac shadow is prominent. 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 Aortic calcifications are noted. Lungs are well aerated bilaterally. Mild central vascular prominence is again seen without edema. No  focal infiltrate is noted. No bony abnormality is seen. IMPRESSION: No acute abnormality noted. Electronically Signed   By: Oneil Devonshire M.D.   On: 04/22/2023 00:21    Microbiology: Results for orders placed or performed during the hospital encounter of 04/21/23  Blood Culture (routine x 2)     Status: Abnormal   Collection Time: 04/21/23 11:52 PM   Specimen: Left Antecubital; Blood  Result Value Ref Range Status   Specimen Description   Final    LEFT ANTECUBITAL Performed at [facility], [address] [place], [place] [address]    Special Requests   Final    BOTTLES DRAWN AEROBIC AND ANAEROBIC Blood Culture results may not be optimal due to an inadequate volume of blood received in culture bottles Performed at [facility], [address] [place], [place] [address]    Culture  Setup Time   Final    GRAM POSITIVE COCCI IN BOTH AEROBIC AND ANAEROBIC BOTTLES CRITICAL RESULT CALLED TO, READ BACK BY AND VERIFIED WITH: MADISON HUNT @1053  04/22/23 MJU GRAM STAIN REVIEWED-AGREE WITH RESULT Performed at [facility], [address], [place], [place] [address]    Culture STREPTOCOCCUS GALLOLYTICUS (A)  Final   Report Status 04/27/2023 FINAL  Final   Organism ID, Bacteria STREPTOCOCCUS GALLOLYTICUS  Final      Susceptibility   Streptococcus gallolyticus - MIC*    PENICILLIN 0.12 SENSITIVE Sensitive     CEFTRIAXONE  0.25 SENSITIVE Sensitive     ERYTHROMYCIN <=0.12 SENSITIVE Sensitive     LEVOFLOXACIN  2 SENSITIVE Sensitive     VANCOMYCIN 0.5 SENSITIVE Sensitive     * STREPTOCOCCUS GALLOLYTICUS  Blood Culture ID Panel (Reflexed)     Status: Abnormal   Collection Time: 04/21/23 11:52 PM  Result Value Ref Range Status   Enterococcus faecalis NOT DETECTED NOT DETECTED Final   Enterococcus Faecium NOT DETECTED NOT DETECTED Final  Listeria monocytogenes NOT DETECTED NOT DETECTED Final   Staphylococcus species NOT DETECTED NOT DETECTED Final   Staphylococcus  aureus (BCID) NOT DETECTED NOT DETECTED Final   Staphylococcus epidermidis NOT DETECTED NOT DETECTED Final   Staphylococcus lugdunensis NOT DETECTED NOT DETECTED Final   Streptococcus species DETECTED (A) NOT DETECTED Final    Comment: Not Enterococcus species, Streptococcus agalactiae, Streptococcus pyogenes, or Streptococcus pneumoniae. CRITICAL RESULT CALLED TO, READ BACK BY AND VERIFIED WITH: MADISON HUNT @1053  04/22/23 MJU    Streptococcus agalactiae NOT DETECTED NOT DETECTED Final   Streptococcus pneumoniae NOT DETECTED NOT DETECTED Final   Streptococcus pyogenes NOT DETECTED NOT DETECTED Final   A.calcoaceticus-baumannii NOT DETECTED NOT DETECTED Final   Bacteroides fragilis NOT DETECTED NOT DETECTED Final   Enterobacterales NOT DETECTED NOT DETECTED Final   Enterobacter cloacae complex NOT DETECTED NOT DETECTED Final   Escherichia coli NOT DETECTED NOT DETECTED Final   Klebsiella aerogenes NOT DETECTED NOT DETECTED Final   Klebsiella oxytoca NOT DETECTED NOT DETECTED Final   Klebsiella pneumoniae NOT DETECTED NOT DETECTED Final   Proteus species NOT DETECTED NOT DETECTED Final   Salmonella species NOT DETECTED NOT DETECTED Final   Serratia marcescens NOT DETECTED NOT DETECTED Final   Haemophilus influenzae NOT DETECTED NOT DETECTED Final   Neisseria meningitidis NOT DETECTED NOT DETECTED Final   Pseudomonas aeruginosa NOT DETECTED NOT DETECTED Final   Stenotrophomonas maltophilia NOT DETECTED NOT DETECTED Final   Candida albicans NOT DETECTED NOT DETECTED Final   Candida auris NOT DETECTED NOT DETECTED Final   Candida glabrata NOT DETECTED NOT DETECTED Final   Candida krusei NOT DETECTED NOT DETECTED Final   Candida parapsilosis NOT DETECTED NOT DETECTED Final   Candida tropicalis NOT DETECTED NOT DETECTED Final   Cryptococcus neoformans/gattii NOT DETECTED NOT DETECTED Final    Comment: Performed at [facility], [address] [place], [place] [address]  Resp  panel by RT-PCR (RSV, Flu A&B, Covid) Anterior Nasal Swab     Status: None   Collection Time: 04/21/23 11:56 PM   Specimen: Anterior Nasal Swab  Result Value Ref Range Status   SARS Coronavirus 2 by RT PCR NEGATIVE NEGATIVE Final    Comment: (NOTE) SARS-CoV-2 target nucleic acids are NOT DETECTED.  The SARS-CoV-2 RNA is generally detectable in upper respiratory specimens during the acute phase of infection. The lowest concentration of SARS-CoV-2 viral copies this assay can detect is 138 copies/mL. A negative result does not preclude SARS-Cov-2 infection and should not be used as the sole basis for treatment or other patient management decisions. A negative result may occur with  improper specimen collection/handling, submission of specimen other than nasopharyngeal swab, presence of viral mutation(s) within the areas targeted by this assay, and inadequate number of viral copies(<138 copies/mL). A negative result must be combined with clinical observations, patient history, and epidemiological information. The expected result is Negative.  Fact Sheet for Patients:  bloggercourse.com  Fact Sheet for Healthcare Providers:  seriousbroker.it  This test is no t yet approved or cleared by the [place]  FDA and  has been authorized for detection and/or diagnosis of SARS-CoV-2 by FDA under an Emergency Use Authorization (EUA). This EUA will remain  in effect (meaning this test can be used) for the duration of the COVID-19 declaration under Section 564(b)(1) of the Act, 21 U.S.C.section 360bbb-3(b)(1), unless the authorization is terminated  or revoked sooner.       Influenza A by PCR NEGATIVE NEGATIVE Final   Influenza B by PCR  NEGATIVE NEGATIVE Final    Comment: (NOTE) The Xpert Xpress SARS-CoV-2/FLU/RSV plus assay is intended as an aid in the diagnosis of influenza from Nasopharyngeal swab specimens and should not be used as a  sole basis for treatment. Nasal washings and aspirates are unacceptable for Xpert Xpress SARS-CoV-2/FLU/RSV testing.  Fact Sheet for Patients: bloggercourse.com  Fact Sheet for Healthcare Providers: seriousbroker.it  This test is not yet approved or cleared by the [place]  FDA and has been authorized for detection and/or diagnosis of SARS-CoV-2 by FDA under an Emergency Use Authorization (EUA). This EUA will remain in effect (meaning this test can be used) for the duration of the COVID-19 declaration under Section 564(b)(1) of the Act, 21 U.S.C. section 360bbb-3(b)(1), unless the authorization is terminated or revoked.     Resp Syncytial Virus by PCR NEGATIVE NEGATIVE Final    Comment: (NOTE) Fact Sheet for Patients: bloggercourse.com  Fact Sheet for Healthcare Providers: seriousbroker.it  This test is not yet approved or cleared by the [place]  FDA and has been authorized for detection and/or diagnosis of SARS-CoV-2 by FDA under an Emergency Use Authorization (EUA). This EUA will remain in effect (meaning this test can be used) for the duration of the COVID-19 declaration under Section 564(b)(1) of the Act, 21 U.S.C. section 360bbb-3(b)(1), unless the authorization is terminated or revoked.  Performed at [facility], [address] [place], [place] [address]   Blood Culture (routine x 2)     Status: None   Collection Time: 04/22/23  9:49 PM   Specimen: BLOOD  Result Value Ref Range Status   Specimen Description BLOOD BLOOD LEFT ARM  Final   Special Requests   Final    BOTTLES DRAWN AEROBIC AND ANAEROBIC Blood Culture results may not be optimal due to an inadequate volume of blood received in culture bottles   Culture   Final    NO GROWTH 5 DAYS Performed at [facility], [address], [place], [place] [address]    Report Status  04/27/2023 FINAL  Final  Urine Culture (for pregnant, neutropenic or urologic patients or patients with an indwelling urinary catheter)     Status: None   Collection Time: 04/23/23  9:29 AM   Specimen: Urine, Clean Catch  Result Value Ref Range Status   Specimen Description   Final    URINE, CLEAN CATCH Performed at [facility], [address], [place], [place] [address]    Special Requests   Final    NONE Performed at [facility], [address] [place], [place] [address]    Culture   Final    NO GROWTH Performed at [facility], [address] [place], [place] [address]    Report Status 04/25/2023 FINAL  Final  Culture, blood (Routine X 2) w Reflex to ID Panel     Status: None (Preliminary result)   Collection Time: 04/24/23  9:56 AM   Specimen: BLOOD LEFT ARM  Result Value Ref Range Status   Specimen Description BLOOD LEFT ARM  Final   Special Requests   Final    BOTTLES DRAWN AEROBIC AND ANAEROBIC Blood Culture adequate volume   Culture   Final    NO GROWTH 4 DAYS Performed at [facility], [address] [place], [place] [address]    Report Status PENDING  Incomplete  Culture, blood (Routine X 2) w Reflex to ID Panel     Status: None (Preliminary result)   Collection Time: 04/24/23  9:56 AM   Specimen: BLOOD LEFT ARM  Result Value Ref Range Status   Specimen Description BLOOD LEFT ARM  Final   Special Requests   Final    BOTTLES DRAWN AEROBIC AND ANAEROBIC Blood Culture adequate volume   Culture   Final    NO GROWTH 4 DAYS Performed at [facility], [address] [place], [place] [address]    Report Status PENDING  Incomplete    Labs: CBC: Recent Labs  Lab 04/21/23 2352 04/22/23 0242 04/23/23 0457 04/24/23 0104 04/25/23 0332 04/27/23 0434 04/28/23 0422  WBC 7.3   < > 8.8 9.6 9.7 8.0 10.2  NEUTROABS 6.1  --   --   --   --   --   --   HGB 11.7*   < > 9.6* 9.1* 9.7* 9.8* 10.6*  HCT 36.0*   < > 29.1* 27.0*  28.8* 29.7* 31.2*  MCV 98.9   < > 96.7 94.4 96.6 97.7 93.4  PLT 158   < > 142* 148* 146* 126* 169   < > = values in this interval not displayed.   Basic Metabolic Panel: Recent Labs  Lab 04/23/23 0457 04/24/23 0104 04/25/23 0332 04/27/23 0434 04/28/23 0422  NA 138 136 139 137 138  K 4.1 4.2 4.4 5.2* 4.8  CL 101 102 105 104 108  CO2 23 21* 22 21* 20*  GLUCOSE 157* 141* 102* 120* 80  BUN 97* 97* 92* 93* 82*  CREATININE 3.99* 3.85* 3.91* 3.51* 3.15*  CALCIUM 8.2* 7.9* 8.1* 8.4* 8.5*   Liver Function Tests: Recent Labs  Lab 04/21/23 2352  AST 19  ALT 15  ALKPHOS 66  BILITOT 0.7  PROT 6.9  ALBUMIN 3.3*   CBG: Recent Labs  Lab 04/27/23 1132 04/27/23 1554 04/27/23 2047 04/28/23 0224 04/28/23 1150  GLUCAP 59* 119* 125* 81 139*    Discharge time spent: greater than 30 minutes.  Signed: Charlie Patterson, MD [facility] 04/28/2023

## 2023-04-28 NOTE — Anesthesia Postprocedure Evaluation (Signed)
 Anesthesia Post Note  Patient: [name] [name]  Procedure(s) Performed: COLONOSCOPY  Patient location during evaluation: Endoscopy Anesthesia Type: General Level of consciousness: awake and alert Pain management: pain level controlled Vital Signs Assessment: post-procedure vital signs reviewed and stable Respiratory status: spontaneous breathing, nonlabored ventilation, respiratory function stable and patient connected to nasal cannula oxygen  Cardiovascular status: blood pressure returned to baseline and stable Postop Assessment: no apparent nausea or vomiting Anesthetic complications: no   No notable events documented.   Last Vitals:  Vitals:   04/28/23 0940 04/28/23 1243  BP: (!) 180/76 (!) 174/67  Pulse:  (!) 59  Resp:    Temp:    SpO2:  93%    Last Pain:  Vitals:   04/28/23 0940  TempSrc:   PainSc: 0-No pain                 Camellia Merilee Louder

## 2023-04-28 NOTE — Op Note (Signed)
 [facility] Gastroenterology Patient Name: [name] [name] Procedure Date: 04/28/2023 8:26 AM MRN: [id number] Account #: [id number] Date of Birth: Jan 08, 1946 Admit Type: Inpatient Age: 78 Room: [facility] ENDO ROOM 4 Gender: Male Note Status: Finalized Instrument Name: Arvis [id number] Procedure:             Colonoscopy Indications:           Unexplained iron deficiency anemia, Blood cultures                         POSITIVE for S. Gallolyticus. Concern for colon                         neoplasm. Providers:             Nathanyal Ashmead K. Aundria MD, MD Referring MD:          Orlean Alan HERO, FNP Medicines:             Propofol  per Anesthesia Complications:         No immediate complications. Estimated blood loss:                         Minimal. Procedure:             Pre-Anesthesia Assessment:                        - The risks and benefits of the procedure and the                         sedation options and risks were discussed with the                         patient. All questions were answered and informed                         consent was obtained.                        - Patient identification and proposed procedure were                         verified prior to the procedure by the nurse. The                         procedure was verified in the procedure room.                        - ASA Grade Assessment: III - A patient with severe                         systemic disease.                        - After reviewing the risks and benefits, the patient                         was deemed in satisfactory condition to undergo the                         procedure.  After obtaining informed consent, the colonoscope was                         passed under direct vision. Throughout the procedure,                         the patient's blood pressure, pulse, and oxygen                          saturations were monitored continuously. The                          Colonoscope was introduced through the anus and                         advanced to the the cecum, identified by appendiceal                         orifice and ileocecal valve. The colonoscopy was                         performed without difficulty. The patient tolerated                         the procedure well. The quality of the bowel                         preparation was adequate. The ileocecal valve,                         appendiceal orifice, and rectum were photographed. Findings:      The perianal and digital rectal examinations were normal. Pertinent       negatives include normal sphincter tone and no palpable rectal lesions.      Many medium-mouthed diverticula were found in the sigmoid colon.      Five sessile polyps were found in the hepatic flexure. The polyps were 7       to 15 mm in size. These polyps were removed with a hot snare. Resection       and retrieval were complete. To prevent bleeding after the polypectomy,       three hemostatic clips were successfully placed (MR conditional). Clip       manufacturer: [employer]. There was no bleeding during, or at the       end, of the procedure.      A 12 mm polyp was found in the proximal transverse colon. The polyp was       sessile. The polyp was removed with a hot snare. Resection and retrieval       were complete. To prevent bleeding after the polypectomy, one hemostatic       clip was successfully placed (MR conditional). Clip manufacturer: [employer]. There was no bleeding during, or at the end, of the       procedure.      Two sessile polyps were found in the transverse colon. The polyps were 6       to 8 mm in size. These polyps were removed with a cold snare. Resection       and retrieval were complete.  Two sessile polyps were found in the splenic flexure. The polyps were 8       to 10 mm in size. These polyps were removed with a cold snare. Resection       and  retrieval were complete. Estimated blood loss was minimal.      A 15 mm polyp was found in the descending colon. The polyp was       semi-pedunculated. The polyp was removed with a hot snare. Resection and       retrieval were complete.      The exam was otherwise without abnormality on direct and retroflexion       views. Impression:            - Diverticulosis in the sigmoid colon.                        - Five 7 to 15 mm polyps at the hepatic flexure,                         removed with a hot snare. Resected and retrieved.                         Clips (MR conditional) were placed. Clip manufacturer:                         [employer].                        - One 12 mm polyp in the proximal transverse colon,                         removed with a hot snare. Resected and retrieved. Clip                         (MR conditional) was placed. Clip manufacturer: [employer].                        - Two 6 to 8 mm polyps in the transverse colon,                         removed with a cold snare. Resected and retrieved.                        - Two 8 to 10 mm polyps at the splenic flexure,                         removed with a cold snare. Resected and retrieved.                        - One 15 mm polyp in the descending colon, removed                         with a hot snare. Resected and retrieved.                        - The examination  was otherwise normal on direct and                         retroflexion views. Recommendation:        - Return patient to hospital ward for ongoing care.                        - Advance diet as tolerated.                        - Continue present medications.                        - Await pathology results.                        - [facility] will sign off for now. Call us  back if any                         changes clinically or if we can help for any reason.                        - The findings and recommendations were  discussed with                         the patient. Procedure Code(s):     --- Professional ---                        [phone], Colonoscopy, flexible; with removal of                         tumor(s), polyp(s), or other lesion(s) by snare                         technique Diagnosis Code(s):     --- Professional ---                        K57.30, Diverticulosis of large intestine without                         perforation or abscess without bleeding                        D50.9, Iron deficiency anemia, unspecified                        D12.4, Benign neoplasm of descending colon                        D12.3, Benign neoplasm of transverse colon (hepatic                         flexure or splenic flexure) CPT copyright 2022 American Medical Association. All rights reserved. The codes documented in this report are preliminary and upon coder review may  be revised to meet current compliance requirements. Ladell MARLA Boss MD, MD 04/28/2023 9:30:34 AM This report has been signed electronically. Number of Addenda: 0 Note Initiated On: 04/28/2023 8:26 AM Scope Withdrawal Time: 0 hours 30 minutes 25 seconds  Total Procedure  Duration: 0 hours 38 minutes 57 seconds  Estimated Blood Loss:  Estimated blood loss was minimal. Estimated blood loss                         was minimal. Estimated blood loss was minimal.      [facility]

## 2023-04-28 NOTE — Discharge Instructions (Addendum)
 Bactermia with Strep Gallolyticus Check bmp in follow up appointment Check Blood cultures 2 weeks following completion of antibiotics Check Blood cultures 4 weeks after completion of antibiotics  Hold aspirin for three days then can resume

## 2023-04-29 LAB — CULTURE, BLOOD (ROUTINE X 2)
Culture: NO GROWTH
Culture: NO GROWTH
Special Requests: ADEQUATE
Special Requests: ADEQUATE

## 2023-04-30 ENCOUNTER — Encounter: Payer: Self-pay | Admitting: Internal Medicine

## 2023-04-30 ENCOUNTER — Telehealth: Payer: Self-pay | Admitting: *Deleted

## 2023-04-30 DIAGNOSIS — G4733 Obstructive sleep apnea (adult) (pediatric): Secondary | ICD-10-CM | POA: Diagnosis not present

## 2023-04-30 NOTE — Progress Notes (Signed)
 Complex Care Management Note  Care Guide Note 04/30/2023 Name: [name] [name] MRN: [id number] DOB: [birth year]  [name] [name] is a 78 y.o. year old male who sees Orlean Alan HERO, FNP for primary care. I reached out to Ryan Deward Stetler by phone today to offer complex care management services.  [name] was given information about Complex Care Management services today including:   The Complex Care Management services include support from the care team which includes your Nurse Coordinator, Clinical Social Worker, or Pharmacist.  The Complex Care Management team is here to help remove barriers to the health concerns and goals most important to you. Complex Care Management services are voluntary, and the patient may decline or stop services at any time by request to their care team member.   Complex Care Management Consent Status: Patient agreed to services and verbal consent obtained.   Follow up plan:  Telephone appointment with complex care management team member scheduled for:  05/02/2023  Encounter Outcome:  Patient Scheduled  Thedford Franks, [facility] Care Coordination Care Guide Direct Dial: [phone]

## 2023-05-01 LAB — SURGICAL PATHOLOGY

## 2023-05-02 ENCOUNTER — Ambulatory Visit: Payer: Medicare HMO | Admitting: Oncology

## 2023-05-02 ENCOUNTER — Other Ambulatory Visit: Payer: Medicare HMO

## 2023-05-02 ENCOUNTER — Ambulatory Visit: Payer: Self-pay

## 2023-05-02 DIAGNOSIS — I129 Hypertensive chronic kidney disease with stage 1 through stage 4 chronic kidney disease, or unspecified chronic kidney disease: Secondary | ICD-10-CM | POA: Diagnosis not present

## 2023-05-02 DIAGNOSIS — D631 Anemia in chronic kidney disease: Secondary | ICD-10-CM | POA: Diagnosis not present

## 2023-05-02 DIAGNOSIS — N050 Unspecified nephritic syndrome with minor glomerular abnormality: Secondary | ICD-10-CM | POA: Diagnosis not present

## 2023-05-02 DIAGNOSIS — E1122 Type 2 diabetes mellitus with diabetic chronic kidney disease: Secondary | ICD-10-CM | POA: Diagnosis not present

## 2023-05-02 DIAGNOSIS — N2581 Secondary hyperparathyroidism of renal origin: Secondary | ICD-10-CM | POA: Diagnosis not present

## 2023-05-02 DIAGNOSIS — R809 Proteinuria, unspecified: Secondary | ICD-10-CM | POA: Diagnosis not present

## 2023-05-02 DIAGNOSIS — N184 Chronic kidney disease, stage 4 (severe): Secondary | ICD-10-CM | POA: Diagnosis not present

## 2023-05-02 NOTE — Patient Outreach (Signed)
  Care Coordination   Initial Visit Note   05/02/2023 Name: [name] [name] MRN: [id number] DOB: December 09, 1945  [name] [name] is a 78 y.o. year old male who sees Orlean Alan HERO, FNP for primary care. I spoke with  Ryan Deward Dirks and spouse Alvon Nygaard by phone today.  What matters to the patients health and wellness today?  Per chart review patient hospitalized from 04/17/23 to 04/21/23 for bacteremia due to streptococcus, acute HF/ copd exacerbation.  Patient states he is doing pretty good other than feeling cold and wanting to sleep all of the time.  Wife and patient states his post hospital follow up visit is scheduled for 05/07/23.  Wife states patient has a follow up with the oncologist on 05/09/23. Wife states patient also has upcoming infection disease and vascular appointments.  Wife states doctors seem to be unsure where infection is originating.  Wife states patient continues to take his antibiotics as prescribed. Patient denies any increase in symptoms related to shortness of breath. Wife states patient wears his oxygen .[name] She reports oxygen  saturation are usually in the mid to high 90's.  She states he is not weighing consistently.     Goals Addressed             This Visit's Progress    Post hospital follow up management and education and overall management of chronic health conditions.       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 Assessed for new / ongoing symptoms.]   Doctor Visits Discussed/Reviewed Doctor Visits Discussed  [reviewed upcoming provider visits. Confirmed patient has transportation to appointments.  Advised to attend scheduled provider visits as recommended. confirmed patient has post hospital follow up visit scheduled with primary provider.]  Exercise Interventions   Exercise Discussed/Reviewed Physical Activity  [Assessed patients current physical activity level.]  Education Interventions   Education Provided Provided Education, Provided Printed Education  [reviewed signs / symptoms of HF/ COPD.  Advised to notify provider for increase in symptoms. Advised to call 911 for severe breathing symptoms. education articles for HF/ copd symptoms/ action plan sent to patient in MyChart.]  Nutrition Interventions   Nutrition Discussed/Reviewed Nutrition Discussed, Decreasing salt  Pharmacy Interventions   Pharmacy Dicussed/Reviewed Pharmacy Topics Discussed  [medication list reviewed and compliance discussed. Confirmed patient taking antibiotics as prescribed.]              SDOH assessments and interventions completed:  Yes  SDOH Interventions Today    Flowsheet Row Most Recent Value  SDOH Interventions   Food Insecurity Interventions Intervention Not Indicated  Housing Interventions Intervention Not Indicated  Transportation Interventions Intervention Not Indicated  Utilities Interventions Intervention Not Indicated        Care Coordination Interventions:  Yes, provided   Follow up plan: Follow up call scheduled for 05/15/23 at 1:30 pm    Encounter Outcome:  Patient Visit Completed   Ricardo Schubach RN,BSN,CCM [facility]  [facility], [facility] coordinator / Case Manager Phone: [phone]

## 2023-05-02 NOTE — Patient Instructions (Signed)
 Visit Information  Thank you for taking time to visit with me today. Please don't hesitate to contact me if I can be of assistance to you.   Following are the goals we discussed today:   Goals Addressed             This Visit's Progress    Post hospital follow up management and education and overall management of chronic health conditions.       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 Assessed for new / ongoing symptoms.]  Doctor Visits Discussed/Reviewed Doctor Visits Discussed  [reviewed upcoming provider visits. Confirmed patient has transportation to appointments.  Advised to attend scheduled provider visits as recommended. confirmed patient has post hospital follow up visit scheduled with primary provider.]  Exercise Interventions   Exercise Discussed/Reviewed Physical Activity  [Assessed patients current physical activity level.]  Education Interventions   Education Provided Provided Education, Provided Printed Education  [reviewed signs / symptoms of HF/ COPD.  Advised to notify provider for increase in symptoms. Advised to call 911 for severe breathing symptoms. education articles for HF/ copd symptoms/ action plan sent to patient in MyChart.]  Nutrition Interventions   Nutrition Discussed/Reviewed Nutrition Discussed, Decreasing salt  Pharmacy Interventions   Pharmacy Dicussed/Reviewed Pharmacy Topics Discussed  [medication list reviewed and compliance discussed. Confirmed patient taking antibiotics  as prescribed.]              Our next appointment is by telephone on 05/15/23 at 1:30 pm  Please call the care guide team at [phone] if you need to cancel or reschedule your appointment.   If you are experiencing a Mental Health or Behavioral Health Crisis or need someone to talk to, please call the Suicide and Crisis Lifeline: 988 call 1-800-273-TALK (toll free, 24 hour hotline)  Patient verbalizes understanding of instructions and care plan provided today and agrees to view in MyChart. Active MyChart status and patient understanding of how to access instructions and care plan via MyChart confirmed with patient.     Mirah Nevins RN,BSN,CCM [place]  [facility], [facility] coordinator / Case Manager Phone: [phone] Heart Failure Action Plan A heart failure action plan helps you understand what to do when you have symptoms of heart failure. Your action plan is a color-coded plan that lists the symptoms to watch for and indicates what actions to take. If you have symptoms in the red zone, you need medical care right away. If you have symptoms in the yellow zone, you are having problems. If you have symptoms in the Lindsi Bayliss zone, you are doing well. Follow the plan that was created by you and your health care provider. Review your plan each time you visit your health care provider. Red zone These signs and symptoms mean you should get medical help right away: You have trouble breathing when resting. You have a dry cough that is getting worse. You have swelling or pain in your legs or abdomen that is getting worse. You suddenly gain more than 2-3 lb (0.9-1.4 kg) in 24 hours, or more than 5 lb (2.3 kg)  in a week. This amount may be more or less depending on your condition. You have trouble staying awake or you feel confused. You have chest pain. You do not have an appetite. You pass out. You have worsening sadness or depression. If you have any  of these symptoms, call your local emergency services (911 in the [place] right away. Do not drive yourself to the hospital. Yellow zone These signs and symptoms mean your condition may be getting worse and you should make some changes: You have trouble breathing when you are active, or you need to sleep with your head raised on extra pillows to help you breathe. You have swelling in your legs or abdomen. You gain 2-3 lb (0.9-1.4 kg) in 24 hours, or 5 lb (2.3 kg) in a week. This amount may be more or less depending on your condition. You get tired easily. You have trouble sleeping. You have a dry cough. If you have any of these symptoms: Contact your health care provider within the next day. Your health care provider may adjust your medicines. Scherry Laverne zone These signs mean you are doing well and can continue what you are doing: You do not have shortness of breath. You have very little swelling or no new swelling. Your weight is stable (no gain or loss). You have a normal activity level. You do not have chest pain or any other new symptoms. Follow these instructions at home: Take over-the-counter and prescription medicines only as told by your health care provider. Weigh yourself daily. Your target weight is __________ lb (__________ kg). Call your health care provider if you gain more than __________ lb (__________ kg) in 24 hours, or more than __________ lb (__________ kg) in a week. Health care provider name: _____________________________________________________ Health care provider phone number: _____________________________________________________ Eat a heart-healthy diet. Work with a diet and nutrition specialist (dietitian) to create an eating plan that is best for you. Keep all follow-up visits. This is important. Where to find more information American Heart Association: Summary A heart failure action plan helps you understand what to do when you have symptoms of heart  failure. Follow the action plan that was created by you and your health care provider. Get help right away if you have any symptoms in the red zone. This information is not intended to replace advice given to you by your health care provider. Make sure you discuss any questions you have with your health care provider. Document Revised: 07/19/2021 Document Reviewed: 11/24/2019 Elsevier Patient Education  2024 [employer]  COPD Action Plan A COPD action plan is a description of what to do when you have a flare (exacerbation) of chronic obstructive pulmonary disease (COPD). Your action plan is a color-coded plan that lists the symptoms that indicate whether your condition is under control and what actions to take. If you have symptoms in the Mersadie Kavanaugh zone, it means you are doing well that day. If you have symptoms in the yellow zone, it means you are having a bad day or an exacerbation. If you have symptoms in the red zone, you need urgent medical care. Follow the plan that you and your health care provider developed. Review your plan with your health care provider at each visit. Red zone Symptoms in this zone mean that you should get medical help right away. They include: Feeling very short of breath, even when you are resting. Not being able to do any activities because of poor breathing. Not being able to sleep because of  poor breathing. Fever or shaking chills. Feeling confused or very sleepy. Chest pain. Coughing up blood. If you have any of these symptoms, call emergency services (911 in the [place] or go to the nearest emergency room. Yellow zone Symptoms in this zone mean that your condition may be getting worse. They include: Feeling more short of breath than usual. Having less energy for daily activities than usual. Phlegm or mucus that is thicker than usual. Needing to use your rescue inhaler or nebulizer more often than usual. More ankle swelling than usual. Coughing more than  usual. Feeling like you have a chest cold. Trouble sleeping due to COPD symptoms. Decreased appetite. COPD medicines not helping as much as usual. If you experience any yellow symptoms: Keep taking your daily medicines as directed. Use your quick-relief inhaler as told by your health care provider. If you were prescribed steroid medicine to take by mouth (oral medicine), start taking it as told by your health care provider. If you were prescribed an antibiotic medicine, start taking it as told by your health care provider. Do not stop taking the antibiotic even if you start to feel better. Use oxygen  as told by your health care provider. Get more rest. Do your pursed-lip breathing exercises. Do not smoke. Avoid any irritants in the air. If your signs and symptoms do not improve after taking these steps, call your health care provider right away. Loriene Taunton zone Symptoms in this zone mean that you are doing well. They include: Being able to do your usual activities and exercise. Having the usual amount of coughing, including the same amount of phlegm or mucus. Being able to sleep well. Having a good appetite. Where to find more information: You can find more information about COPD from: American Lung Association, My COPD Action Plan: www.lung.org COPD Foundation: www.copdfoundation.org National Heart, Lung, & Blood Institute: popsteam.is Follow these instructions at home: Continue taking your daily medicines as told by your health care provider. Make sure you receive all the immunizations that your health care provider recommends, especially the pneumococcal and influenza vaccines. Wash your hands often with soap and water . Have family members wash their hands too. Regular hand washing can help prevent infections. Follow your usual exercise and diet plan. Avoid irritants in the air, such as smoke. Do not use any products that contain nicotine or tobacco. These products include  cigarettes, chewing tobacco, and vaping devices, such as e-cigarettes. If you need help quitting, ask your health care provider. Summary A COPD action plan tells you what to do when you have a flare (exacerbation) of chronic obstructive pulmonary disease (COPD). Follow each action plan for your symptoms. If you have any symptoms in the red zone, call emergency services (911 in the [place] or go to the nearest emergency room. This information is not intended to replace advice given to you by your health care provider. Make sure you discuss any questions you have with your health care provider. Document Revised: 02/17/2020 Document Reviewed: 02/17/2020 Elsevier Patient Education  2024 [employer].

## 2023-05-04 DIAGNOSIS — H353221 Exudative age-related macular degeneration, left eye, with active choroidal neovascularization: Secondary | ICD-10-CM | POA: Diagnosis not present

## 2023-05-07 ENCOUNTER — Ambulatory Visit (INDEPENDENT_AMBULATORY_CARE_PROVIDER_SITE_OTHER): Payer: PPO | Admitting: Family

## 2023-05-07 ENCOUNTER — Encounter: Payer: Self-pay | Admitting: Family

## 2023-05-07 VITALS — BP 166/62 | HR 75 | Ht 63.0 in | Wt 200.0 lb

## 2023-05-07 DIAGNOSIS — A408 Other streptococcal sepsis: Secondary | ICD-10-CM

## 2023-05-07 NOTE — Progress Notes (Signed)
 Established Patient Office Visit  Subjective:  Patient ID: [name] [name], male    DOB: 28-Jan-1946  Age: 78 y.o. MRN: [id number]  Chief Complaint  Patient presents with   Hospitalization Follow-up    Hospital follow up    Patient is here today for hospital follow up.  He was admitted to [facility] with Sepsis due to streptococcus gallolyticus, says that he feels somewhat better.  They did an inpatient colonoscopy given this infection, which found some polyps that came back as not cancerous.   Has appointments set up with Oncology this week, Nephrology and Infectious Disease are set up as well.    No other concerns at this time.   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 post. helix. 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   CAD (coronary artery disease)    CAP (community acquired pneumonia) 07/26/2020   CKD (chronic kidney disease), stage IV (HCC)    Complete tear of right rotator cuff 11/15/2015   Complex tear of lateral meniscus of left knee as current injury 12/06/2020   Complex tear of medial meniscus of left knee as current injury 10/18/2020   COPD (chronic obstructive pulmonary disease) (HCC)    DM (diabetes mellitus), type 2 (HCC) 07/30/2020   no meds   DOE (dyspnea on exertion)    GERD (gastroesophageal reflux disease)    Gout    Grade I diastolic dysfunction    Heart murmur    HFrEF (heart failure with reduced ejection fraction) (HCC)    History of hiatal hernia    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  Performed by Dr. Ricky on 08/01/16:  29 mm commercially available Medtronic Core Valve transcatheter aortic valve procedure placed via transfemoral approach by Dr. Ricky and co-surgeon Dr. Godfrey.   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 MOHS.   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/07/2021   Right zygoma, [facility] 05/02/21   Supplemental oxygen  dependent    3-4 L/[place]   Supplemental oxygen  dependent    T2DM (type 2 diabetes mellitus) (HCC)    no meds    Past Surgical History:  Procedure Laterality Date   AORTIC VALVE REPLACEMENT N/A 08/01/2016   29 mm CoreValve  [place]volut; Location: [facility]; Surgeon: Reyes Fruits, MD   CARDIAC CATHETERIZATION N/A 12/26/2012   2v CAD; retained pigtail in myocardium --> transferred to [facility]; Location: [facility]; Surgeon: Vita Bathe, MD   CATARACT EXTRACTION W/PHACO Left 10/03/2022   Procedure: CATARACT EXTRACTION PHACO AND INTRAOCULAR LENS PLACEMENT (IOC) LEFT DIABETIC 8.22 00:55.5;  Surgeon: Jaye Fallow, MD;  Location: [facility];  Service: Ophthalmology;  Laterality: Left;  sleep apnea   CATARACT EXTRACTION W/PHACO Right 10/17/2022   Procedure: CATARACT EXTRACTION PHACO AND INTRAOCULAR LENS PLACEMENT (IOC) RIGHT  DIABETIC;  Surgeon: Jaye Fallow, MD;  Location: [facility];  Service: Ophthalmology;  Laterality: Right;  4.79 0:37.4   COLONOSCOPY     COLONOSCOPY N/A 04/28/2023   Procedure: COLONOSCOPY;  Surgeon: Toledo, Ladell POUR, MD;  Location: [facility] ENDOSCOPY;  Service: Gastroenterology;  Laterality: N/A;   KNEE ARTHROSCOPY WITH MEDIAL MENISECTOMY Left 12/02/2020   Procedure: KNEE ARTHROSCOPY WITH DEBRIDEMENT AND PARTIAL MEDIAL AND LATERAL MENISECTOMY;  Surgeon: Edie Norleen PARAS, MD;  Location: [facility] ORS;  Service: Orthopedics;  Laterality: Left;   LITHOTRIPSY     PERCUTANEOUS REMOVAL INTRA-AORTIC BALLOON CATH N/A 12/26/2012   Procedure: PERCUTANEOUS REMOVAL INTRA-AORTIC BALLOON CATH; Surgeon: Lang Lannie Salter, MD; Location: [facility] OPERATING ROOMS; Service: Cardiothoracic   POLYPECTOMY  04/28/2023   Procedure: POLYPECTOMY;  Surgeon: Aundria, Ladell POUR, MD;  Location: [facility] ENDOSCOPY;  Service: Gastroenterology;;   RIGHT HEART CATH Right 07/13/2016   Location: [facility]; Surgeon: Ozell Estelle, MD   TEE WITHOUT CARDIOVERSION Right 04/26/2023   Procedure: TRANSESOPHAGEAL ECHOCARDIOGRAM (TEE);  Surgeon: Bathe Denyse LABOR, MD;  Location: [facility] ORS;  Service: Cardiovascular;  Laterality: Right;   TRANSESOPHAGEAL ECHOCARDIOGRAM N/A 12/26/2012   Procedure: TRANSESOPHAGEAL ECHOCARDIOGRAPHY; Surgeon: Lang Lannie Salter, MD; Location: [facility]; Service: Cardiothoracic   UMBILICAL HERNIA REPAIR N/A 02/13/2014   Procedure: LAPAROSCOPIC UMBILICAL HERNIA REPAIR; Surgeon: Shanda ONEIDA Salter, MD; Location: [facility]; Service: General Surgery    Social History   Socioeconomic History   Marital status: Married    Spouse name: Calistro Rauf   Number of children: 2   Years of education: Not on file   Highest education level: Not on file  Occupational History   Occupation: retired  Tobacco Use   Smoking status: Former    Current packs/day: 0.00    Average packs/day: 2.0 packs/day for 54.0 years  (108.0 ttl pk-yrs)    Types: Cigarettes    Start date: 38    Quit date: 2010    Years since quitting: 15.0   Smokeless tobacco: Former    Types: Chew    Quit date: 05/16/2004  Vaping Use   Vaping status: Never Used  Substance and Sexual Activity   Alcohol use: No   Drug use: Never   Sexual activity: Not on file  Other Topics Concern   Not on file  Social History Narrative   Not on file   Social Drivers of Health   Financial Resource Strain: Low Risk  (03/09/2023)   Overall Financial Resource Strain (CARDIA)    Difficulty of Paying Living Expenses: Not hard at all  Food Insecurity: No Food Insecurity (05/02/2023)   Hunger Vital Sign    Worried About Running Out of Food in the Last Year: Never true    Ran Out of Food in the Last Year: Never true  Transportation Needs: No Transportation Needs (05/02/2023)   PRAPARE - Administrator, Civil Service (Medical): No    Lack of Transportation (Non-Medical): No  Physical Activity: Inactive (  03/09/2023)   Exercise Vital Sign    Days of Exercise per Week: 0 days    Minutes of Exercise per Session: 0 min  Stress: No Stress Concern Present (03/09/2023)   [employer] of Occupational Health - Occupational Stress Questionnaire    Feeling of Stress : Not at all  Social Connections: Moderately Integrated (04/24/2023)   Social Connection and Isolation Panel [NHANES]    Frequency of Communication with Friends and Family: More than three times a week    Frequency of Social Gatherings with Friends and Family: More than three times a week    Attends Religious Services: More than 4 times per year    Active Member of [employer] or Organizations: No    Attends Banker Meetings: Never    Marital Status: Married  Catering Manager Violence: Not At Risk (05/02/2023)   Humiliation, Afraid, Rape, and Kick questionnaire    Fear of Current or Ex-Partner: No    Emotionally Abused: No    Physically Abused: No    Sexually Abused: No     Family History  Adopted: Yes  Problem Relation Age of Onset   Varicose Veins Daughter    Obesity Daughter    Hyperlipidemia Daughter    Diabetes Daughter    COPD Daughter    Depression Daughter    Asthma Daughter    Arthritis Son    Diabetes Son    Hypertension Son     Allergies  Allergen Reactions   Nsaids Other (See Comments)    Avoid due to kidney disease    Statins Other (See Comments)    Myalgias   Nexletol [Bempedoic Acid] Diarrhea    fatigue   Silver Other (See Comments)   Tegaderm High Gelling Alginate [Wound Dressings] Rash    Review of Systems  Constitutional:  Positive for malaise/fatigue.  Respiratory:  Positive for shortness of breath.   All other systems reviewed and are negative.      Objective:   BP (!) 166/62   Pulse 75   Ht 5' 3 (1.6 m)   Wt 200 lb (90.7 kg)   SpO2 95%   BMI 35.43 kg/m   Vitals:   05/07/23 1304  BP: (!) 166/62  Pulse: 75  Height: 5' 3 (1.6 m)  Weight: 200 lb (90.7 kg)  SpO2: 95%  BMI (Calculated): 35.44    Physical Exam Vitals and nursing note reviewed.  Constitutional:      Appearance: Normal appearance. He is obese.  Eyes:     Pupils: Pupils are equal, round, and reactive to light.  Cardiovascular:     Rate and Rhythm: Normal rate and regular rhythm.     Pulses: Normal pulses.     Heart sounds: Murmur heard.  Pulmonary:     Effort: Pulmonary effort is normal.     Breath sounds: Normal breath sounds.  Neurological:     General: No focal deficit present.     Mental Status: He is alert and oriented to person, place, and time. Mental status is at baseline.  Psychiatric:        Mood and Affect: Mood normal.        Behavior: Behavior normal.        Thought Content: Thought content normal.        Judgment: Judgment normal.      No results found for any visits on 05/07/23.  Recent Results (from the past 2160 hours)  CMP14+EGFR     Status: Abnormal  Collection Time: 02/12/23  2:26 PM  Result  Value Ref Range   Glucose 110 (H) 70 - 99 mg/dL   BUN 45 (H) 8 - 27 mg/dL   Creatinine, Ser 6.71 (H) 0.76 - 1.27 mg/dL   eGFR 19 (L) >40 fO/fpw/8.26   BUN/Creatinine Ratio 14 10 - 24   Sodium 145 (H) 134 - 144 mmol/L   Potassium 5.4 (H) 3.5 - 5.2 mmol/L   Chloride 110 (H) 96 - 106 mmol/L   CO2 20 20 - 29 mmol/L   Calcium 9.0 8.6 - 10.2 mg/dL   Total Protein 6.4 6.0 - 8.5 g/dL   Albumin 3.4 (L) 3.8 - 4.8 g/dL   Globulin, Total 3.0 1.5 - 4.5 g/dL   Bilirubin Total 0.2 0.0 - 1.2 mg/dL   Alkaline Phosphatase 78 44 - 121 IU/L   AST 15 0 - 40 IU/L   ALT 10 0 - 44 IU/L  TSH     Status: Abnormal   Collection Time: 03/06/23  9:42 AM  Result Value Ref Range   TSH 4.600 (H) 0.450 - 4.500 uIU/mL  Lipid panel     Status: Abnormal   Collection Time: 03/06/23  9:42 AM  Result Value Ref Range   Cholesterol, Total 84 (L) 100 - 199 mg/dL   Triglycerides 835 (H) 0 - 149 mg/dL   HDL 33 (L) >60 mg/dL   VLDL Cholesterol Cal 27 5 - 40 mg/dL   LDL Chol Calc (NIH) 24 0 - 99 mg/dL   Chol/HDL Ratio 2.5 0.0 - 5.0 ratio    Comment:                                   T. Chol/HDL Ratio                                             Men  Women                               1/2 Avg.Risk  3.4    3.3                                   Avg.Risk  5.0    4.4                                2X Avg.Risk  9.6    7.1                                3X Avg.Risk 23.4   11.0   Hemoglobin A1c     Status: None   Collection Time: 03/06/23  9:42 AM  Result Value Ref Range   Hgb A1c MFr Bld 5.5 4.8 - 5.6 %    Comment:          Prediabetes: 5.7 - 6.4          Diabetes: >6.4          Glycemic control for adults with diabetes: <7.0    Est. average glucose Bld gHb Est-mCnc 111 mg/dL  CMP14+EGFR     Status: Abnormal   Collection Time: 03/06/23  9:42 AM  Result Value Ref Range   Glucose 106 (H) 70 - 99 mg/dL   BUN 47 (H) 8 - 27 mg/dL   Creatinine, Ser 6.27 (H) 0.76 - 1.27 mg/dL   eGFR 16 (L) >40 fO/fpw/8.26   BUN/Creatinine  Ratio 13 10 - 24   Sodium 142 134 - 144 mmol/L   Potassium 5.2 3.5 - 5.2 mmol/L   Chloride 103 96 - 106 mmol/L   CO2 21 20 - 29 mmol/L   Calcium 9.2 8.6 - 10.2 mg/dL   Total Protein 7.0 6.0 - 8.5 g/dL   Albumin 3.8 3.8 - 4.8 g/dL   Globulin, Total 3.2 1.5 - 4.5 g/dL   Bilirubin Total 0.3 0.0 - 1.2 mg/dL   Alkaline Phosphatase 101 44 - 121 IU/L   AST 18 0 - 40 IU/L   ALT 6 0 - 44 IU/L  Surgical pathology     Status: None   Collection Time: 03/07/23 12:00 AM  Result Value Ref Range   SURGICAL PATHOLOGY      SURGICAL PATHOLOGY [facility] [address] [place], [place] [address] Telephone [phone] or [phone] Fax [phone]  REPORT OF DERMATOPATHOLOGY   Accession #: [id number] Patient Name: [name] [name], [name] [name] Visit # : [id number]  MRN: [id number] Cytotechnologist: Santa Md, Eleanor, Dermatopathologist, Electronic Signature DOB/Age Oct 09, 1945 (Age: 27) Gender: M Collected Date: 03/07/2023 Received Date: 03/07/2023  FINAL DIAGNOSIS       1. Skin, right dorsal hand :       SEBORRHEIC KERATOSIS, IRRITATED       DATE SIGNED OUT: 03/09/2023 ELECTRONIC SIGNATURE : Munoz-Bishop Md, Melissa, Dermatopathologist, Electronic Signature  MICROSCOPIC DESCRIPTION 1. The epidermis exhibits hyperkeratosis, acanthosis, elongation of rete ridges and papillomatosis.  Some of the rete are interconnected.  Cells in some areas have squamoid rather than basaloid features.  The pattern is that of irritated seborrheic keratosis.   Focally, there are some features, which resemble those seen in a verruca.  CASE COMMENTS STAINS USED IN DIAGNOSIS: H&E    CLINICAL HISTORY  SPECIMEN(S) OBTAINED 1. Skin, Right Dorsal Hand  SPECIMEN COMMENTS: 1. 7 mm pink keratotic papule SPECIMEN CLINICAL INFORMATION: 1. Neoplasm of uncertain behavior of skin, R/O SCC vs hypertrophic AK    Gross Description 1. Formalin fixed specimen received:   8 X 6 X 1 MM, TOTO (3 P) (1 B) ( hwc )        Report signed out from the following location(s) [place] [address] [place], [place] [address] CLIA #: [id number]  [facility] [address] [place], [place] [address] CLIA #: [id number]   POCT Glucose (CBG)     Status: Abnormal   Collection Time: 03/09/23  1:33 PM  Result Value Ref Range   POC Glucose 155 (A) 70 - 99 mg/dl  Lipase, blood     Status: Abnormal   Collection Time: 03/23/23 11:38 AM  Result Value Ref Range   Lipase 52 (H) 11 - 51 U/L    Comment: Performed at [facility], [address] [place], [place] [address]  Comprehensive metabolic panel     Status: Abnormal   Collection Time: 03/23/23 11:38 AM  Result Value Ref Range   Sodium 140 135 - 145 mmol/L   Potassium 4.5 3.5 - 5.1 mmol/L   Chloride 108 98 - 111 mmol/L   CO2 20 (  L) 22 - 32 mmol/L   Glucose, Bld 124 (H) 70 - 99 mg/dL    Comment: Glucose reference range applies only to samples taken after fasting for at least 8 hours.   BUN 72 (H) 8 - 23 mg/dL   Creatinine, Ser 5.50 (H) 0.61 - 1.24 mg/dL   Calcium 9.0 8.9 - 89.6 mg/dL   Total Protein 8.1 6.5 - 8.1 g/dL   Albumin 3.8 3.5 - 5.0 g/dL   AST 14 (L) 15 - 41 U/L   ALT 8 0 - 44 U/L   Alkaline Phosphatase 83 38 - 126 U/L   Total Bilirubin 0.7 <1.2 mg/dL   GFR, Estimated 13 (L) >60 mL/min    Comment: (NOTE) Calculated using the CKD-EPI Creatinine Equation (2021)    Anion gap 12 5 - 15    Comment: Performed at [facility], [address] [place], [place] [address]  CBC     Status: Abnormal   Collection Time: 03/23/23 11:38 AM  Result Value Ref Range   WBC 7.4 4.0 - 10.5 K/uL   RBC 3.62 (L) 4.22 - 5.81 MIL/uL   Hemoglobin 11.6 (L) 13.0 - 17.0 g/dL   HCT 63.4 (L) 60.9 - 47.9 %   MCV 100.8 (H) 80.0 - 100.0 fL   MCH 32.0 26.0 - 34.0 pg   MCHC 31.8 30.0 - 36.0 g/dL   RDW 86.3 88.4 - 84.4 %   Platelets 189 150 - 400 K/uL   nRBC 0.0 0.0 -  0.2 %    Comment: Performed at [facility], [address] [place], [place] [address]  Magnesium      Status: None   Collection Time: 03/23/23 11:38 AM  Result Value Ref Range   Magnesium  1.7 1.7 - 2.4 mg/dL    Comment: Performed at [facility], [address] [place], [place] [address]  Resp panel by RT-PCR (RSV, Flu A&B, Covid) Anterior Nasal Swab     Status: None   Collection Time: 03/23/23 11:39 AM   Specimen: Anterior Nasal Swab  Result Value Ref Range   SARS Coronavirus 2 by RT PCR NEGATIVE NEGATIVE    Comment: (NOTE) SARS-CoV-2 target nucleic acids are NOT DETECTED.  The SARS-CoV-2 RNA is generally detectable in upper respiratory specimens during the acute phase of infection. The lowest concentration of SARS-CoV-2 viral copies this assay can detect is 138 copies/mL. A negative result does not preclude SARS-Cov-2 infection and should not be used as the sole basis for treatment or other patient management decisions. A negative result may occur with  improper specimen collection/handling, submission of specimen other than nasopharyngeal swab, presence of viral mutation(s) within the areas targeted by this assay, and inadequate number of viral copies(<138 copies/mL). A negative result must be combined with clinical observations, patient history, and epidemiological information. The expected result is Negative.  Fact Sheet for Patients:  bloggercourse.com  Fact Sheet for Healthcare Providers:  seriousbroker.it  This test is no t yet approved or cleared by the [place]  FDA and  has been authorized for detection and/or diagnosis of SARS-CoV-2 by FDA under an Emergency Use Authorization (EUA). This EUA will remain  in effect (meaning this test can be used) for the duration of the COVID-19 declaration under Section 564(b)(1) of the Act, 21 U.S.C.section 360bbb-3(b)(1), unless the authorization is  terminated  or revoked sooner.       Influenza A by PCR NEGATIVE NEGATIVE   Influenza B by PCR NEGATIVE NEGATIVE    Comment: (  NOTE) The Xpert Xpress SARS-CoV-2/FLU/RSV plus assay is intended as an aid in the diagnosis of influenza from Nasopharyngeal swab specimens and should not be used as a sole basis for treatment. Nasal washings and aspirates are unacceptable for Xpert Xpress SARS-CoV-2/FLU/RSV testing.  Fact Sheet for Patients: bloggercourse.com  Fact Sheet for Healthcare Providers: seriousbroker.it  This test is not yet approved or cleared by the [place]  FDA and has been authorized for detection and/or diagnosis of SARS-CoV-2 by FDA under an Emergency Use Authorization (EUA). This EUA will remain in effect (meaning this test can be used) for the duration of the COVID-19 declaration under Section 564(b)(1) of the Act, 21 U.S.C. section 360bbb-3(b)(1), unless the authorization is terminated or revoked.     Resp Syncytial Virus by PCR NEGATIVE NEGATIVE    Comment: (NOTE) Fact Sheet for Patients: bloggercourse.com  Fact Sheet for Healthcare Providers: seriousbroker.it  This test is not yet approved or cleared by the [place]  FDA and has been authorized for detection and/or diagnosis of SARS-CoV-2 by FDA under an Emergency Use Authorization (EUA). This EUA will remain in effect (meaning this test can be used) for the duration of the COVID-19 declaration under Section 564(b)(1) of the Act, 21 U.S.C. section 360bbb-3(b)(1), unless the authorization is terminated or revoked.  Performed at [facility], [address] [place], [place] [address]   Urinalysis, Routine w reflex microscopic -Urine, Clean Catch     Status: Abnormal   Collection Time: 03/23/23 12:56 PM  Result Value Ref Range   Color, Urine YELLOW (A) YELLOW   APPearance HAZY (A) CLEAR    Specific Gravity, Urine 1.009 1.005 - 1.030   pH 5.0 5.0 - 8.0   Glucose, UA NEGATIVE NEGATIVE mg/dL   Hgb urine dipstick NEGATIVE NEGATIVE   Bilirubin Urine NEGATIVE NEGATIVE   Ketones, ur NEGATIVE NEGATIVE mg/dL   Protein, ur 899 (A) NEGATIVE mg/dL   Nitrite NEGATIVE NEGATIVE   Leukocytes,Ua NEGATIVE NEGATIVE   RBC / HPF 0-5 0 - 5 RBC/hpf   WBC, UA 0-5 0 - 5 WBC/hpf   Bacteria, UA NONE SEEN NONE SEEN   Squamous Epithelial / HPF 0-5 0 - 5 /HPF   Mucus PRESENT    Hyaline Casts, UA PRESENT     Comment: Performed at [facility], [address] [place], [place] [address]  Procalcitonin     Status: None   Collection Time: 04/21/23 11:52 PM  Result Value Ref Range   Procalcitonin 0.22 ng/mL    Comment:        Interpretation: PCT (Procalcitonin) <= 0.5 ng/mL: Systemic infection (sepsis) is not likely. Local bacterial infection is possible. (NOTE)       Sepsis PCT Algorithm           Lower Respiratory Tract                                      Infection PCT Algorithm    ----------------------------     ----------------------------         PCT < 0.25 ng/mL                PCT < 0.10 ng/mL          Strongly encourage             Strongly discourage   discontinuation of antibiotics    initiation of antibiotics    ----------------------------     -----------------------------  PCT 0.25 - 0.50 ng/mL            PCT 0.10 - 0.25 ng/mL               OR       >80% decrease in PCT            Discourage initiation of                                            antibiotics      Encourage discontinuation           of antibiotics    ----------------------------     -----------------------------         PCT >= 0.50 ng/mL              PCT 0.26 - 0.50 ng/mL               AND        <80% decrease in PCT             Encourage initiation of                                             antibiotics       Encourage continuation           of antibiotics    ----------------------------      -----------------------------        PCT >= 0.50 ng/mL                  PCT > 0.50 ng/mL               AND         increase in PCT                  Strongly encourage                                      initiation of antibiotics    Strongly encourage escalation           of antibiotics                                     -----------------------------                                           PCT <= 0.25 ng/mL                                                 OR                                        > 80% decrease in PCT  Discontinue / Do not initiate                                             antibiotics  Performed at [facility], [address] [place], [place] [address]   Lactic acid, plasma     Status: None   Collection Time: 04/21/23 11:52 PM  Result Value Ref Range   Lactic Acid, Venous 1.0 0.5 - 1.9 mmol/L    Comment: Performed at [facility], [address] [place], [place] [address]  Comprehensive metabolic panel     Status: Abnormal   Collection Time: 04/21/23 11:52 PM  Result Value Ref Range   Sodium 138 135 - 145 mmol/L   Potassium 4.7 3.5 - 5.1 mmol/L   Chloride 99 98 - 111 mmol/L   CO2 26 22 - 32 mmol/L   Glucose, Bld 132 (H) 70 - 99 mg/dL    Comment: Glucose reference range applies only to samples taken after fasting for at least 8 hours.   BUN 76 (H) 8 - 23 mg/dL   Creatinine, Ser 6.50 (H) 0.61 - 1.24 mg/dL   Calcium 8.5 (L) 8.9 - 10.3 mg/dL   Total Protein 6.9 6.5 - 8.1 g/dL   Albumin 3.3 (L) 3.5 - 5.0 g/dL   AST 19 15 - 41 U/L   ALT 15 0 - 44 U/L   Alkaline Phosphatase 66 38 - 126 U/L   Total Bilirubin 0.7 <1.2 mg/dL   GFR, Estimated 17 (L) >60 mL/min    Comment: (NOTE) Calculated using the CKD-EPI Creatinine Equation (2021)    Anion gap 13 5 - 15    Comment: Performed at [facility], [address] [place], [place] [address]  CBC with Differential     Status: Abnormal    Collection Time: 04/21/23 11:52 PM  Result Value Ref Range   WBC 7.3 4.0 - 10.5 K/uL   RBC 3.64 (L) 4.22 - 5.81 MIL/uL   Hemoglobin 11.7 (L) 13.0 - 17.0 g/dL   HCT 63.9 (L) 60.9 - 47.9 %   MCV 98.9 80.0 - 100.0 fL   MCH 32.1 26.0 - 34.0 pg   MCHC 32.5 30.0 - 36.0 g/dL   RDW 86.6 88.4 - 84.4 %   Platelets 158 150 - 400 K/uL   nRBC 0.0 0.0 - 0.2 %   Neutrophils Relative % 85 %   Neutro Abs 6.1 1.7 - 7.7 K/uL   Lymphocytes Relative 7 %   Lymphs Abs 0.5 (L) 0.7 - 4.0 K/uL   Monocytes Relative 6 %   Monocytes Absolute 0.5 0.1 - 1.0 K/uL   Eosinophils Relative 0 %   Eosinophils Absolute 0.0 0.0 - 0.5 K/uL   Basophils Relative 0 %   Basophils Absolute 0.0 0.0 - 0.1 K/uL   Immature Granulocytes 2 %   Abs Immature Granulocytes 0.15 (H) 0.00 - 0.07 K/uL    Comment: Performed at [facility], [address] [place], [place] [address]  Protime-INR     Status: None   Collection Time: 04/21/23 11:52 PM  Result Value Ref Range   Prothrombin Time 14.0 11.4 - 15.2 seconds   INR 1.1 0.8 - 1.2    Comment: (NOTE) INR goal varies based on device and disease states. Performed at [facility], [address] [place], [place] [address]   APTT  Status: None   Collection Time: 04/21/23 11:52 PM  Result Value Ref Range   aPTT 29 24 - 36 seconds    Comment: Performed at [facility] [address] [place], [place] [address]  Blood Culture (routine x 2)     Status: Abnormal   Collection Time: 04/21/23 11:52 PM   Specimen: Left Antecubital; Blood  Result Value Ref Range   Specimen Description      LEFT ANTECUBITAL Performed at [facility], [address] [place], [place] [address]    Special Requests      BOTTLES DRAWN AEROBIC AND ANAEROBIC Blood Culture results may not be optimal due to an inadequate volume of blood received in culture bottles Performed at [facility], [address] [place], [place] [address]    Culture  Setup Time       GRAM POSITIVE COCCI IN BOTH AEROBIC AND ANAEROBIC BOTTLES CRITICAL RESULT CALLED TO, READ BACK BY AND VERIFIED WITH: MADISON HUNT @1053  04/22/23 MJU GRAM STAIN REVIEWED-AGREE WITH RESULT Performed at [facility], [address], [place], [place] [address]    Culture STREPTOCOCCUS GALLOLYTICUS (A)    Report Status 04/27/2023 FINAL    Organism ID, Bacteria STREPTOCOCCUS GALLOLYTICUS       Susceptibility   Streptococcus gallolyticus - MIC*    PENICILLIN 0.12 SENSITIVE Sensitive     CEFTRIAXONE  0.25 SENSITIVE Sensitive     ERYTHROMYCIN <=0.12 SENSITIVE Sensitive     LEVOFLOXACIN  2 SENSITIVE Sensitive     VANCOMYCIN 0.5 SENSITIVE Sensitive     * STREPTOCOCCUS GALLOLYTICUS  Troponin I (High Sensitivity)     Status: None   Collection Time: 04/21/23 11:52 PM  Result Value Ref Range   Troponin I (High Sensitivity) 17 <18 ng/L    Comment: (NOTE) Elevated high sensitivity troponin I (hsTnI) values and significant  changes across serial measurements may suggest ACS but many other  chronic and acute conditions are known to elevate hsTnI results.  Refer to the Links section for chest pain algorithms and additional  guidance. Performed at [facility], [address] [place], [place] [address]   Brain natriuretic peptide     Status: Abnormal   Collection Time: 04/21/23 11:52 PM  Result Value Ref Range   B Natriuretic Peptide 820.9 (H) 0.0 - 100.0 pg/mL    Comment: Performed at [facility], [address] [place], [place] [address]  Blood Culture ID Panel (Reflexed)     Status: Abnormal   Collection Time: 04/21/23 11:52 PM  Result Value Ref Range   Enterococcus faecalis NOT DETECTED NOT DETECTED   Enterococcus Faecium NOT DETECTED NOT DETECTED   Listeria monocytogenes NOT DETECTED NOT DETECTED   Staphylococcus species NOT DETECTED NOT DETECTED   Staphylococcus aureus (BCID) NOT DETECTED NOT DETECTED   Staphylococcus epidermidis NOT DETECTED NOT DETECTED    Staphylococcus lugdunensis NOT DETECTED NOT DETECTED   Streptococcus species DETECTED (A) NOT DETECTED    Comment: Not Enterococcus species, Streptococcus agalactiae, Streptococcus pyogenes, or Streptococcus pneumoniae. CRITICAL RESULT CALLED TO, READ BACK BY AND VERIFIED WITH: MADISON HUNT @1053  04/22/23 MJU    Streptococcus agalactiae NOT DETECTED NOT DETECTED   Streptococcus pneumoniae NOT DETECTED NOT DETECTED   Streptococcus pyogenes NOT DETECTED NOT DETECTED   A.calcoaceticus-baumannii NOT DETECTED NOT DETECTED   Bacteroides fragilis NOT DETECTED NOT DETECTED   Enterobacterales NOT DETECTED NOT DETECTED   Enterobacter cloacae complex NOT DETECTED NOT DETECTED   Escherichia coli NOT DETECTED NOT DETECTED  Klebsiella aerogenes NOT DETECTED NOT DETECTED   Klebsiella oxytoca NOT DETECTED NOT DETECTED   Klebsiella pneumoniae NOT DETECTED NOT DETECTED   Proteus species NOT DETECTED NOT DETECTED   Salmonella species NOT DETECTED NOT DETECTED   Serratia marcescens NOT DETECTED NOT DETECTED   Haemophilus influenzae NOT DETECTED NOT DETECTED   Neisseria meningitidis NOT DETECTED NOT DETECTED   Pseudomonas aeruginosa NOT DETECTED NOT DETECTED   Stenotrophomonas maltophilia NOT DETECTED NOT DETECTED   Candida albicans NOT DETECTED NOT DETECTED   Candida auris NOT DETECTED NOT DETECTED   Candida glabrata NOT DETECTED NOT DETECTED   Candida krusei NOT DETECTED NOT DETECTED   Candida parapsilosis NOT DETECTED NOT DETECTED   Candida tropicalis NOT DETECTED NOT DETECTED   Cryptococcus neoformans/gattii NOT DETECTED NOT DETECTED    Comment: Performed at [facility], [address] [place], [place] [address]  Resp panel by RT-PCR (RSV, Flu A&B, Covid) Anterior Nasal Swab     Status: None   Collection Time: 04/21/23 11:56 PM   Specimen: Anterior Nasal Swab  Result Value Ref Range   SARS Coronavirus 2 by RT PCR NEGATIVE NEGATIVE    Comment: (NOTE) SARS-CoV-2 target nucleic  acids are NOT DETECTED.  The SARS-CoV-2 RNA is generally detectable in upper respiratory specimens during the acute phase of infection. The lowest concentration of SARS-CoV-2 viral copies this assay can detect is 138 copies/mL. A negative result does not preclude SARS-Cov-2 infection and should not be used as the sole basis for treatment or other patient management decisions. A negative result may occur with  improper specimen collection/handling, submission of specimen other than nasopharyngeal swab, presence of viral mutation(s) within the areas targeted by this assay, and inadequate number of viral copies(<138 copies/mL). A negative result must be combined with clinical observations, patient history, and epidemiological information. The expected result is Negative.  Fact Sheet for Patients:  bloggercourse.com  Fact Sheet for Healthcare Providers:  seriousbroker.it  This test is no t yet approved or cleared by the [place]  FDA and  has been authorized for detection and/or diagnosis of SARS-CoV-2 by FDA under an Emergency Use Authorization (EUA). This EUA will remain  in effect (meaning this test can be used) for the duration of the COVID-19 declaration under Section 564(b)(1) of the Act, 21 U.S.C.section 360bbb-3(b)(1), unless the authorization is terminated  or revoked sooner.       Influenza A by PCR NEGATIVE NEGATIVE   Influenza B by PCR NEGATIVE NEGATIVE    Comment: (NOTE) The Xpert Xpress SARS-CoV-2/FLU/RSV plus assay is intended as an aid in the diagnosis of influenza from Nasopharyngeal swab specimens and should not be used as a sole basis for treatment. Nasal washings and aspirates are unacceptable for Xpert Xpress SARS-CoV-2/FLU/RSV testing.  Fact Sheet for Patients: bloggercourse.com  Fact Sheet for Healthcare Providers: seriousbroker.it  This test is not  yet approved or cleared by the [place]  FDA and has been authorized for detection and/or diagnosis of SARS-CoV-2 by FDA under an Emergency Use Authorization (EUA). This EUA will remain in effect (meaning this test can be used) for the duration of the COVID-19 declaration under Section 564(b)(1) of the Act, 21 U.S.C. section 360bbb-3(b)(1), unless the authorization is terminated or revoked.     Resp Syncytial Virus by PCR NEGATIVE NEGATIVE    Comment: (NOTE) Fact Sheet for Patients: bloggercourse.com  Fact Sheet for Healthcare Providers: seriousbroker.it  This test is not yet approved or cleared by the [place]  FDA and has been authorized  for detection and/or diagnosis of SARS-CoV-2 by FDA under an Emergency Use Authorization (EUA). This EUA will remain in effect (meaning this test can be used) for the duration of the COVID-19 declaration under Section 564(b)(1) of the Act, 21 U.S.C. section 360bbb-3(b)(1), unless the authorization is terminated or revoked.  Performed at [facility], [address] [place], [place] [address]   Urinalysis, Routine w reflex microscopic -Urine, Clean Catch     Status: Abnormal   Collection Time: 04/21/23 11:56 PM  Result Value Ref Range   Color, Urine YELLOW (A) YELLOW   APPearance HAZY (A) CLEAR   Specific Gravity, Urine 1.013 1.005 - 1.030   pH 5.0 5.0 - 8.0   Glucose, UA NEGATIVE NEGATIVE mg/dL   Hgb urine dipstick NEGATIVE NEGATIVE   Bilirubin Urine NEGATIVE NEGATIVE   Ketones, ur NEGATIVE NEGATIVE mg/dL   Protein, ur >=699 (A) NEGATIVE mg/dL   Nitrite NEGATIVE NEGATIVE   Leukocytes,Ua NEGATIVE NEGATIVE   RBC / HPF 0-5 0 - 5 RBC/hpf   WBC, UA 0-5 0 - 5 WBC/hpf   Bacteria, UA RARE (A) NONE SEEN   Squamous Epithelial / HPF 0-5 0 - 5 /HPF   Mucus PRESENT    Hyaline Casts, UA PRESENT     Comment: Performed at [facility], [address] [place], [place]  [address]  Lactic acid, plasma     Status: Abnormal   Collection Time: 04/22/23  2:42 AM  Result Value Ref Range   Lactic Acid, Venous 2.3 (HH) 0.5 - 1.9 mmol/L    Comment: CRITICAL RESULT CALLED TO, READ BACK BY AND VERIFIED WITH DONNY MEALY RN @ [phone] 04/22/23 [facility] Performed at [facility], [address] [place], [place] [address]   Troponin I (High Sensitivity)     Status: Abnormal   Collection Time: 04/22/23  2:42 AM  Result Value Ref Range   Troponin I (High Sensitivity) 29 (H) <18 ng/L    Comment: (NOTE) Elevated high sensitivity troponin I (hsTnI) values and significant  changes across serial measurements may suggest ACS but many other  chronic and acute conditions are known to elevate hsTnI results.  Refer to the Links section for chest pain algorithms and additional  guidance. Performed at [facility], [address] [place], [place] [address]   Basic metabolic panel     Status: Abnormal   Collection Time: 04/22/23  2:42 AM  Result Value Ref Range   Sodium 138 135 - 145 mmol/L   Potassium 4.5 3.5 - 5.1 mmol/L   Chloride 102 98 - 111 mmol/L   CO2 22 22 - 32 mmol/L   Glucose, Bld 175 (H) 70 - 99 mg/dL    Comment: Glucose reference range applies only to samples taken after fasting for at least 8 hours.   BUN 79 (H) 8 - 23 mg/dL   Creatinine, Ser 6.58 (H) 0.61 - 1.24 mg/dL   Calcium 7.8 (L) 8.9 - 10.3 mg/dL   GFR, Estimated 18 (L) >60 mL/min    Comment: (NOTE) Calculated using the CKD-EPI Creatinine Equation (2021)    Anion gap 14 5 - 15    Comment: Performed at [facility], [address] [place], [place] [address]  CBC     Status: Abnormal   Collection Time: 04/22/23  2:42 AM  Result Value Ref Range   WBC 8.1 4.0 - 10.5 K/uL   RBC 3.07 (L) 4.22 - 5.81 MIL/uL   Hemoglobin 9.8 (L) 13.0 - 17.0  g/dL   HCT 69.1 (L) 60.9 - 47.9 %   MCV 100.3 (H) 80.0 - 100.0 fL   MCH 31.9 26.0 - 34.0 pg   MCHC 31.8 30.0 - 36.0 g/dL   RDW 86.6  88.4 - 84.4 %   Platelets 137 (L) 150 - 400 K/uL   nRBC 0.0 0.0 - 0.2 %    Comment: Performed at [facility], [address] [place], [place] [address]  CBG monitoring, ED     Status: Abnormal   Collection Time: 04/22/23  9:12 AM  Result Value Ref Range   Glucose-Capillary 167 (H) 70 - 99 mg/dL    Comment: Glucose reference range applies only to samples taken after fasting for at least 8 hours.  CBG monitoring, ED     Status: Abnormal   Collection Time: 04/22/23 11:50 AM  Result Value Ref Range   Glucose-Capillary 257 (H) 70 - 99 mg/dL    Comment: Glucose reference range applies only to samples taken after fasting for at least 8 hours.  CBG monitoring, ED     Status: Abnormal   Collection Time: 04/22/23  4:19 PM  Result Value Ref Range   Glucose-Capillary 115 (H) 70 - 99 mg/dL    Comment: Glucose reference range applies only to samples taken after fasting for at least 8 hours.  CBG monitoring, ED     Status: Abnormal   Collection Time: 04/22/23  9:28 PM  Result Value Ref Range   Glucose-Capillary 171 (H) 70 - 99 mg/dL    Comment: Glucose reference range applies only to samples taken after fasting for at least 8 hours.  Blood Culture (routine x 2)     Status: None   Collection Time: 04/22/23  9:49 PM   Specimen: BLOOD  Result Value Ref Range   Specimen Description BLOOD BLOOD LEFT ARM    Special Requests      BOTTLES DRAWN AEROBIC AND ANAEROBIC Blood Culture results may not be optimal due to an inadequate volume of blood received in culture bottles   Culture      NO GROWTH 5 DAYS Performed at [facility], [address] [place], [place] [address]    Report Status 04/27/2023 FINAL   CBC     Status: Abnormal   Collection Time: 04/23/23  4:57 AM  Result Value Ref Range   WBC 8.8 4.0 - 10.5 K/uL   RBC 3.01 (L) 4.22 - 5.81 MIL/uL   Hemoglobin 9.6 (L) 13.0 - 17.0 g/dL   HCT 70.8 (L) 60.9 - 47.9 %   MCV 96.7 80.0 - 100.0 fL   MCH 31.9 26.0 - 34.0 pg   MCHC  33.0 30.0 - 36.0 g/dL   RDW 86.7 88.4 - 84.4 %   Platelets 142 (L) 150 - 400 K/uL   nRBC 0.0 0.0 - 0.2 %    Comment: Performed at [facility], [address] [place], [place] [address]  Basic metabolic panel     Status: Abnormal   Collection Time: 04/23/23  4:57 AM  Result Value Ref Range   Sodium 138 135 - 145 mmol/L   Potassium 4.1 3.5 - 5.1 mmol/L   Chloride 101 98 - 111 mmol/L   CO2 23 22 - 32 mmol/L   Glucose, Bld 157 (H) 70 - 99 mg/dL    Comment: Glucose reference range applies only to samples taken after fasting for at least 8 hours.   BUN 97 (H) 8 - 23 mg/dL   Creatinine,  Ser 3.99 (H) 0.61 - 1.24 mg/dL   Calcium 8.2 (L) 8.9 - 10.3 mg/dL   GFR, Estimated 15 (L) >60 mL/min    Comment: (NOTE) Calculated using the CKD-EPI Creatinine Equation (2021)    Anion gap 14 5 - 15    Comment: Performed at [facility], [address] [place], [place] [address]  CBG monitoring, ED     Status: Abnormal   Collection Time: 04/23/23  7:43 AM  Result Value Ref Range   Glucose-Capillary 124 (H) 70 - 99 mg/dL    Comment: Glucose reference range applies only to samples taken after fasting for at least 8 hours.  Urine Culture (for pregnant, neutropenic or urologic patients or patients with an indwelling urinary catheter)     Status: None   Collection Time: 04/23/23  9:29 AM   Specimen: Urine, Clean Catch  Result Value Ref Range   Specimen Description      URINE, CLEAN CATCH Performed at [facility], [address], [place], [place] [address]    Special Requests      NONE Performed at [facility], [address] [place], [place] [address]    Culture      NO GROWTH Performed at [facility], [address], [place], [place] [address]    Report Status 04/25/2023 FINAL   CBG monitoring, ED     Status: Abnormal   Collection Time: 04/23/23 11:41 AM  Result Value Ref Range   Glucose-Capillary 116 (H) 70 - 99 mg/dL    Comment: Glucose  reference range applies only to samples taken after fasting for at least 8 hours.  CBG monitoring, ED     Status: Abnormal   Collection Time: 04/23/23  4:38 PM  Result Value Ref Range   Glucose-Capillary 158 (H) 70 - 99 mg/dL    Comment: Glucose reference range applies only to samples taken after fasting for at least 8 hours.  CBG monitoring, ED     Status: Abnormal   Collection Time: 04/23/23 10:21 PM  Result Value Ref Range   Glucose-Capillary 152 (H) 70 - 99 mg/dL    Comment: Glucose reference range applies only to samples taken after fasting for at least 8 hours.  CBC     Status: Abnormal   Collection Time: 04/24/23  1:04 AM  Result Value Ref Range   WBC 9.6 4.0 - 10.5 K/uL   RBC 2.86 (L) 4.22 - 5.81 MIL/uL   Hemoglobin 9.1 (L) 13.0 - 17.0 g/dL   HCT 72.9 (L) 60.9 - 47.9 %   MCV 94.4 80.0 - 100.0 fL   MCH 31.8 26.0 - 34.0 pg   MCHC 33.7 30.0 - 36.0 g/dL   RDW 86.6 88.4 - 84.4 %   Platelets 148 (L) 150 - 400 K/uL   nRBC 0.0 0.0 - 0.2 %    Comment: Performed at [facility], [address] [place], [place] [address]  Basic metabolic panel     Status: Abnormal   Collection Time: 04/24/23  1:04 AM  Result Value Ref Range   Sodium 136 135 - 145 mmol/L   Potassium 4.2 3.5 - 5.1 mmol/L   Chloride 102 98 - 111 mmol/L   CO2 21 (L) 22 - 32 mmol/L   Glucose, Bld 141 (H) 70 - 99 mg/dL    Comment: Glucose reference range applies only to samples taken after fasting for at least 8 hours.   BUN 97 (H) 8 - 23 mg/dL  Creatinine, Ser 3.85 (H) 0.61 - 1.24 mg/dL   Calcium 7.9 (L) 8.9 - 10.3 mg/dL   GFR, Estimated 15 (L) >60 mL/min    Comment: (NOTE) Calculated using the CKD-EPI Creatinine Equation (2021)    Anion gap 13 5 - 15    Comment: Performed at [facility], [address] [place], [place] [address]  CBG monitoring, ED     Status: Abnormal   Collection Time: 04/24/23  7:41 AM  Result Value Ref Range   Glucose-Capillary 112 (H) 70 - 99 mg/dL    Comment:  Glucose reference range applies only to samples taken after fasting for at least 8 hours.  Culture, blood (Routine X 2) w Reflex to ID Panel     Status: None   Collection Time: 04/24/23  9:56 AM   Specimen: BLOOD LEFT ARM  Result Value Ref Range   Specimen Description BLOOD LEFT ARM    Special Requests      BOTTLES DRAWN AEROBIC AND ANAEROBIC Blood Culture adequate volume   Culture      NO GROWTH 5 DAYS Performed at [facility], [address] [place], [place] [address]    Report Status 04/29/2023 FINAL   Culture, blood (Routine X 2) w Reflex to ID Panel     Status: None   Collection Time: 04/24/23  9:56 AM   Specimen: BLOOD LEFT ARM  Result Value Ref Range   Specimen Description BLOOD LEFT ARM    Special Requests      BOTTLES DRAWN AEROBIC AND ANAEROBIC Blood Culture adequate volume   Culture      NO GROWTH 5 DAYS Performed at [facility], [address] [place], [place] [address]    Report Status 04/29/2023 FINAL   CBG monitoring, ED     Status: Abnormal   Collection Time: 04/24/23 11:14 AM  Result Value Ref Range   Glucose-Capillary 124 (H) 70 - 99 mg/dL    Comment: Glucose reference range applies only to samples taken after fasting for at least 8 hours.  ECHOCARDIOGRAM COMPLETE     Status: None   Collection Time: 04/24/23 12:37 PM  Result Value Ref Range   Weight 3,185.21 oz   BP 163/67 mmHg   Ao pk vel 2.30 m/s   AV Area VTI 1.76 cm2   AR max vel 1.83 cm2   AV Mean grad 12.0 mmHg   AV Peak grad 21.1 mmHg   Single Plane A2C EF 47.7 %   Single Plane A4C EF 44.2 %   Calc EF 43.4 %   S' Lateral 4.00 cm   AV Area mean vel 1.71 cm2   Area-P 1/2 4.60 cm2   MV VTI 1.86 cm2   MV M vel 5.83 m/s   MV Peak grad 136.0 mmHg   Radius 0.30 cm   Est EF 50   CBG monitoring, ED     Status: Abnormal   Collection Time: 04/24/23  4:32 PM  Result Value Ref Range   Glucose-Capillary 175 (H) 70 - 99 mg/dL    Comment: Glucose reference range applies only to  samples taken after fasting for at least 8 hours.  Glucose, capillary     Status: Abnormal   Collection Time: 04/24/23  9:40 PM  Result Value Ref Range   Glucose-Capillary 103 (H) 70 - 99 mg/dL    Comment: Glucose reference range applies only to samples taken after fasting for at least 8 hours.  CBC     Status:  Abnormal   Collection Time: 04/25/23  3:32 AM  Result Value Ref Range   WBC 9.7 4.0 - 10.5 K/uL   RBC 2.98 (L) 4.22 - 5.81 MIL/uL   Hemoglobin 9.7 (L) 13.0 - 17.0 g/dL   HCT 71.1 (L) 60.9 - 47.9 %   MCV 96.6 80.0 - 100.0 fL   MCH 32.6 26.0 - 34.0 pg   MCHC 33.7 30.0 - 36.0 g/dL   RDW 86.5 88.4 - 84.4 %   Platelets 146 (L) 150 - 400 K/uL   nRBC 0.0 0.0 - 0.2 %    Comment: Performed at [facility], [address], [place], [place] [address]  Basic metabolic panel     Status: Abnormal   Collection Time: 04/25/23  3:32 AM  Result Value Ref Range   Sodium 139 135 - 145 mmol/L   Potassium 4.4 3.5 - 5.1 mmol/L   Chloride 105 98 - 111 mmol/L   CO2 22 22 - 32 mmol/L   Glucose, Bld 102 (H) 70 - 99 mg/dL    Comment: Glucose reference range applies only to samples taken after fasting for at least 8 hours.   BUN 92 (H) 8 - 23 mg/dL   Creatinine, Ser 6.08 (H) 0.61 - 1.24 mg/dL   Calcium 8.1 (L) 8.9 - 10.3 mg/dL   GFR, Estimated 15 (L) >60 mL/min    Comment: (NOTE) Calculated using the CKD-EPI Creatinine Equation (2021)    Anion gap 12 5 - 15    Comment: Performed at [facility], [address] [place], [place] [address]  Glucose, capillary     Status: None   Collection Time: 04/25/23  7:36 AM  Result Value Ref Range   Glucose-Capillary 79 70 - 99 mg/dL    Comment: Glucose reference range applies only to samples taken after fasting for at least 8 hours.  Glucose, capillary     Status: Abnormal   Collection Time: 04/25/23 12:00 PM  Result Value Ref Range   Glucose-Capillary 140 (H) 70 - 99 mg/dL    Comment: Glucose reference range applies only to samples  taken after fasting for at least 8 hours.  Glucose, capillary     Status: Abnormal   Collection Time: 04/25/23  4:01 PM  Result Value Ref Range   Glucose-Capillary 130 (H) 70 - 99 mg/dL    Comment: Glucose reference range applies only to samples taken after fasting for at least 8 hours.  Glucose, capillary     Status: Abnormal   Collection Time: 04/25/23  8:50 PM  Result Value Ref Range   Glucose-Capillary 148 (H) 70 - 99 mg/dL    Comment: Glucose reference range applies only to samples taken after fasting for at least 8 hours.  Glucose, capillary     Status: Abnormal   Collection Time: 04/26/23  7:23 AM  Result Value Ref Range   Glucose-Capillary 116 (H) 70 - 99 mg/dL    Comment: Glucose reference range applies only to samples taken after fasting for at least 8 hours.  ECHO TEE     Status: None   Collection Time: 04/26/23  8:26 AM  Result Value Ref Range   Est EF 40 - 45%   Glucose, capillary     Status: Abnormal   Collection Time: 04/26/23  9:24 AM  Result Value Ref Range   Glucose-Capillary 100 (H) 70 - 99 mg/dL    Comment: Glucose reference range applies only to samples taken after fasting for at least 8 hours.  Glucose, capillary     Status: Abnormal   Collection Time: 04/26/23 11:48 AM  Result Value Ref Range   Glucose-Capillary 131 (H) 70 - 99 mg/dL    Comment: Glucose reference range applies only to samples taken after fasting for at least 8 hours.  Glucose, capillary     Status: None   Collection Time: 04/26/23  5:15 PM  Result Value Ref Range   Glucose-Capillary 92 70 - 99 mg/dL    Comment: Glucose reference range applies only to samples taken after fasting for at least 8 hours.  Glucose, capillary     Status: Abnormal   Collection Time: 04/26/23  8:47 PM  Result Value Ref Range   Glucose-Capillary 242 (H) 70 - 99 mg/dL    Comment: Glucose reference range applies only to samples taken after fasting for at least 8 hours.  Basic metabolic panel     Status: Abnormal    Collection Time: 04/27/23  4:34 AM  Result Value Ref Range   Sodium 137 135 - 145 mmol/L   Potassium 5.2 (H) 3.5 - 5.1 mmol/L   Chloride 104 98 - 111 mmol/L   CO2 21 (L) 22 - 32 mmol/L   Glucose, Bld 120 (H) 70 - 99 mg/dL    Comment: Glucose reference range applies only to samples taken after fasting for at least 8 hours.   BUN 93 (H) 8 - 23 mg/dL   Creatinine, Ser 6.48 (H) 0.61 - 1.24 mg/dL   Calcium 8.4 (L) 8.9 - 10.3 mg/dL   GFR, Estimated 17 (L) >60 mL/min    Comment: (NOTE) Calculated using the CKD-EPI Creatinine Equation (2021)    Anion gap 12 5 - 15    Comment: Performed at [facility], [address] [place], [place] [address]  CBC     Status: Abnormal   Collection Time: 04/27/23  4:34 AM  Result Value Ref Range   WBC 8.0 4.0 - 10.5 K/uL   RBC 3.04 (L) 4.22 - 5.81 MIL/uL   Hemoglobin 9.8 (L) 13.0 - 17.0 g/dL   HCT 70.2 (L) 60.9 - 47.9 %   MCV 97.7 80.0 - 100.0 fL   MCH 32.2 26.0 - 34.0 pg   MCHC 33.0 30.0 - 36.0 g/dL   RDW 86.7 88.4 - 84.4 %   Platelets 126 (L) 150 - 400 K/uL   nRBC 0.0 0.0 - 0.2 %    Comment: Performed at [facility], [address] [place], [place] [address]  Glucose, capillary     Status: Abnormal   Collection Time: 04/27/23  7:52 AM  Result Value Ref Range   Glucose-Capillary 125 (H) 70 - 99 mg/dL    Comment: Glucose reference range applies only to samples taken after fasting for at least 8 hours.  Glucose, capillary     Status: Abnormal   Collection Time: 04/27/23 11:32 AM  Result Value Ref Range   Glucose-Capillary 59 (L) 70 - 99 mg/dL    Comment: Glucose reference range applies only to samples taken after fasting for at least 8 hours.  Glucose, capillary     Status: Abnormal   Collection Time: 04/27/23  3:54 PM  Result Value Ref Range   Glucose-Capillary 119 (H) 70 - 99 mg/dL    Comment: Glucose reference range applies only to samples taken after fasting for at least 8 hours.  Glucose, capillary     Status: Abnormal    Collection Time: 04/27/23  8:47 PM  Result Value Ref Range  Glucose-Capillary 125 (H) 70 - 99 mg/dL    Comment: Glucose reference range applies only to samples taken after fasting for at least 8 hours.  Surgical pathology     Status: None   Collection Time: 04/28/23 12:00 AM  Result Value Ref Range   SURGICAL PATHOLOGY      SURGICAL PATHOLOGY [facility] [address] [place], [place] [address] Telephone [phone] or [phone] Fax [phone]  REPORT OF SURGICAL PATHOLOGY   Accession #: [id number] Patient Name: [name] [name], [name] [name] Visit # : [id number]  MRN: [id number] Physician: Aundria Lombard DOB/Age [birth year] (Age: 63) Gender: M Collected Date: 04/28/2023 Received Date: 04/30/2023  FINAL DIAGNOSIS       1. Transverse Colon Polyp, Cold snare x2 :       - TUBULAR ADENOMA (2).      - NEGATIVE FOR HIGH-GRADE DYSPLASIA AND MALIGNANCY.       2. Hepatic Flexure Polyp, Hot snare x5 :       - TUBULAR ADENOMA (MULTIPLE FRAGMENTS).      - NEGATIVE FOR HIGH-GRADE DYSPLASIA AND MALIGNANCY.       3. Transverse Colon Polyp, Hot snare proximal :       - TUBULAR ADENOMA.      - NEGATIVE FOR HIGH-GRADE DYSPLASIA AND MALIGNANCY.       4. Splenic Flexure Polyp, x2 Cold snare and CBX :       - HYPERPLASTIC POLYP (2).      - NEGATIVE FOR DYSPLASIA AND  MALIGNANCY.       5. Descending Colon Polyp, Hot snare :       - TUBULAR ADENOMA.      - NEGATIVE FOR HIGH-GRADE DYSPLASIA AND MALIGNANCY.       ELECTRONIC SIGNATURE : Rubinas Md, Rexene , Sports Administrator, Electronic Signature  MICROSCOPIC DESCRIPTION  CASE COMMENTS STAINS USED IN DIAGNOSIS: H&E H&E H&E H&E H&E    CLINICAL HISTORY  SPECIMEN(S) OBTAINED 1. Transverse Colon Polyp, Cold Snare X2 2. Hepatic Flexure Polyp, Hot Snare X5 3. Transverse Colon Polyp, Hot Snare Proximal 4. Splenic Flexure Polyp, X2 Cold Snare And CBX 5. Descending Colon Polyp, Hot Snare  SPECIMEN  COMMENTS: SPECIMEN CLINICAL INFORMATION: 1. Streptococcus gallolyticus, anemia    Gross Description 1. Received in formalin is a 1.0 x 0.5 x 0.2 cm aggregate of multiple tan polypoid tissue fragments submitted entirely in block 1A. 2. Received in formalin is a 1.5 x 1.2 x 0.2 cm aggregate of multiple tan polypoid tissue fragments submitted entirely in block 2A. 3. Received in formal in is a 0.4 x 0.3 x 0.2 cm tan polypoid tissue fragment submitted entirely in block 3A. 4. Received in formalin is a 0.4 x 0.3 x 0.2 cm aggregate of two tan polypoid tissue fragments submitted entirely in block 4A. 5. Received in formalin is a 1.5 x 1.5 x 0.3 cm aggregate of tan polypoid tissue fragments and food material submitted entirely in block 5A.(SB:kh 04/30/23)        Report signed out from the following location(s) [place] [address] [place], [place] [address] CLIA #: [id number]  [facility] [address] [place], [place] [address] CLIA #: [id number]   Glucose, capillary     Status: None   Collection Time: 04/28/23  2:24 AM  Result Value Ref Range   Glucose-Capillary 81 70 - 99 mg/dL    Comment: Glucose reference range applies only to samples taken after fasting for  at least 8 hours.  CBC     Status: Abnormal   Collection Time: 04/28/23  4:22 AM  Result Value Ref Range   WBC 10.2 4.0 - 10.5 K/uL   RBC 3.34 (L) 4.22 - 5.81 MIL/uL   Hemoglobin 10.6 (L) 13.0 - 17.0 g/dL   HCT 68.7 (L) 60.9 - 47.9 %   MCV 93.4 80.0 - 100.0 fL   MCH 31.7 26.0 - 34.0 pg   MCHC 34.0 30.0 - 36.0 g/dL   RDW 86.6 88.4 - 84.4 %   Platelets 169 150 - 400 K/uL   nRBC 0.0 0.0 - 0.2 %    Comment: Performed at [facility], [address], [place], [place] [address]  Basic metabolic panel     Status: Abnormal   Collection Time: 04/28/23  4:22 AM  Result Value Ref Range   Sodium 138 135 - 145 mmol/L   Potassium 4.8 3.5 - 5.1 mmol/L   Chloride 108 98 - 111  mmol/L   CO2 20 (L) 22 - 32 mmol/L   Glucose, Bld 80 70 - 99 mg/dL    Comment: Glucose reference range applies only to samples taken after fasting for at least 8 hours.   BUN 82 (H) 8 - 23 mg/dL   Creatinine, Ser 6.84 (H) 0.61 - 1.24 mg/dL   Calcium 8.5 (L) 8.9 - 10.3 mg/dL   GFR, Estimated 19 (L) >60 mL/min    Comment: (NOTE) Calculated using the CKD-EPI Creatinine Equation (2021)    Anion gap 10 5 - 15    Comment: Performed at [facility], [address] [place], [place] [address]  Glucose, capillary     Status: Abnormal   Collection Time: 04/28/23 11:50 AM  Result Value Ref Range   Glucose-Capillary 139 (H) 70 - 99 mg/dL    Comment: Glucose reference range applies only to samples taken after fasting for at least 8 hours.       Assessment & Plan:   Problem List Items Addressed This Visit   None Visit Diagnoses       Sepsis due to other Streptococcus species, unspecified whether acute organ dysfunction present [facility])    -  Primary   Patient is feeling better, continuing to improve.  Has follow ups already set up with all the needed providers.       Return as previously scheduled, for F/U.   Total time spent: 20 minutes  ALAN CHRISTELLA ARRANT, FNP  05/07/2023   This document may have been prepared by [facility] Voice Recognition software and as such may include unintentional dictation errors.

## 2023-05-08 ENCOUNTER — Other Ambulatory Visit: Payer: Self-pay

## 2023-05-08 DIAGNOSIS — D509 Iron deficiency anemia, unspecified: Secondary | ICD-10-CM

## 2023-05-09 ENCOUNTER — Inpatient Hospital Stay: Payer: HMO | Attending: Oncology

## 2023-05-09 ENCOUNTER — Inpatient Hospital Stay (HOSPITAL_BASED_OUTPATIENT_CLINIC_OR_DEPARTMENT_OTHER): Payer: HMO | Admitting: Oncology

## 2023-05-09 ENCOUNTER — Encounter: Payer: Self-pay | Admitting: Oncology

## 2023-05-09 VITALS — BP 154/65 | HR 72 | Temp 98.6°F | Resp 18 | Wt 199.0 lb

## 2023-05-09 DIAGNOSIS — R809 Proteinuria, unspecified: Secondary | ICD-10-CM | POA: Diagnosis not present

## 2023-05-09 DIAGNOSIS — D509 Iron deficiency anemia, unspecified: Secondary | ICD-10-CM

## 2023-05-09 DIAGNOSIS — E1122 Type 2 diabetes mellitus with diabetic chronic kidney disease: Secondary | ICD-10-CM | POA: Diagnosis not present

## 2023-05-09 DIAGNOSIS — N184 Chronic kidney disease, stage 4 (severe): Secondary | ICD-10-CM

## 2023-05-09 DIAGNOSIS — D631 Anemia in chronic kidney disease: Secondary | ICD-10-CM

## 2023-05-09 DIAGNOSIS — N050 Unspecified nephritic syndrome with minor glomerular abnormality: Secondary | ICD-10-CM | POA: Diagnosis not present

## 2023-05-09 DIAGNOSIS — N2581 Secondary hyperparathyroidism of renal origin: Secondary | ICD-10-CM | POA: Diagnosis not present

## 2023-05-09 DIAGNOSIS — I129 Hypertensive chronic kidney disease with stage 1 through stage 4 chronic kidney disease, or unspecified chronic kidney disease: Secondary | ICD-10-CM | POA: Diagnosis not present

## 2023-05-09 LAB — CBC (CANCER CENTER ONLY)
HCT: 29.8 % — ABNORMAL LOW (ref 39.0–52.0)
Hemoglobin: 9.6 g/dL — ABNORMAL LOW (ref 13.0–17.0)
MCH: 31.4 pg (ref 26.0–34.0)
MCHC: 32.2 g/dL (ref 30.0–36.0)
MCV: 97.4 fL (ref 80.0–100.0)
Platelet Count: 125 10*3/uL — ABNORMAL LOW (ref 150–400)
RBC: 3.06 MIL/uL — ABNORMAL LOW (ref 4.22–5.81)
RDW: 13.6 % (ref 11.5–15.5)
WBC Count: 4.8 10*3/uL (ref 4.0–10.5)
nRBC: 0 % (ref 0.0–0.2)

## 2023-05-09 LAB — FERRITIN: Ferritin: 98 ng/mL (ref 24–336)

## 2023-05-09 LAB — IRON AND TIBC
Iron: 49 ug/dL (ref 45–182)
Saturation Ratios: 21 % (ref 17.9–39.5)
TIBC: 235 ug/dL — ABNORMAL LOW (ref 250–450)
UIBC: 186 ug/dL

## 2023-05-10 NOTE — Progress Notes (Signed)
Hematology/Oncology Consult note [facility]  Telephone:([phone] [phone]  Patient Care Team: Miki Kins, FNP as PCP - General (Family Medicine) Otho Ket, RN as [facility] Care Management Creig Hines, MD as Consulting Physician (Oncology)   Name of the patient: Cashten Goods  [id number]  Aug 01, 1945   Date of visit: 05/10/23  Diagnosis-anemia of chronic kidney disease  Chief complaint/ Reason for visit-routine follow-up of anemia  Heme/Onc history: Patient is a 78 year old male with a past medical history significant for systolic congestive heart failure, type 2 diabetes, obstructive sleep apnea on CPAP, CKD, BPH among other medical problems.  He has been referred to [place] for anemia.  Most recent CBC from 10/04/2021 showed white cell count of 6.8, H&H of 10.3/30.4 with a platelet count of 179.  His baseline hemoglobin runs around 12 and was around that value until March 2023.  Patient does have baseline chronic kidney disease with a creatinine that fluctuates between 2.4-3.1.  Patient follows up with Dr. Lourdes Sledge for his stage IV chronic kidney disease.     Results of blood work from 10/10/2021 were as follows: CBC showed white count of 5.5, H&H of 10.3/30.5 with a platelet count of 198.  CMP showed a serum creatinine of 3.38.  B12 folate normal.  Haptoglobin TSH normal.  Myeloma panel showed no M protein.  Ferritin levels normal at 213.  Patient has not required any EPO or IV iron so far  Interval history-patient was admitted to the hospital in December 2025 with sepsis secondary to Streptococcus and required IV antibiotics.  He is still completing his oral antibiotic course.  He also underwent colonoscopy which showed multiple polyps which were negative for malignancy  ECOG PS- 2 Pain scale- 0   Review of systems- Review of Systems  Constitutional:  Positive for malaise/fatigue. Negative for chills, fever and  weight loss.  HENT:  Negative for congestion, ear discharge and nosebleeds.   Eyes:  Negative for blurred vision.  Respiratory:  Negative for cough, hemoptysis, sputum production, shortness of breath and wheezing.   Cardiovascular:  Negative for chest pain, palpitations, orthopnea and claudication.  Gastrointestinal:  Negative for abdominal pain, blood in stool, constipation, diarrhea, heartburn, melena, nausea and vomiting.  Genitourinary:  Negative for dysuria, flank pain, frequency, hematuria and urgency.  Musculoskeletal:  Negative for back pain, joint pain and myalgias.  Skin:  Negative for rash.  Neurological:  Negative for dizziness, tingling, focal weakness, seizures, weakness and headaches.  Endo/Heme/Allergies:  Does not bruise/bleed easily.  Psychiatric/Behavioral:  Negative for depression and suicidal ideas. The patient does not have insomnia.       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 post. helix. Excised 03/02/2010   BCC (basal cell carcinoma of skin) 11/24/2020  R neck below the ear, EDC   Benign hypertensive kidney disease with chronic kidney disease 02/24/2019   CAD (coronary artery disease)    CAP (community acquired pneumonia) 07/26/2020   CKD (chronic kidney disease), stage IV (HCC)    Complete tear of right rotator cuff 11/15/2015    Complex tear of lateral meniscus of left knee as current injury 12/06/2020   Complex tear of medial meniscus of left knee as current injury 10/18/2020   COPD (chronic obstructive pulmonary disease) (HCC)    DM (diabetes mellitus), type 2 (HCC) 07/30/2020   no meds   DOE (dyspnea on exertion)    GERD (gastroesophageal reflux disease)    Gout    Grade I diastolic dysfunction    Heart murmur    HFrEF (heart failure with reduced ejection fraction) (HCC)    History of hiatal hernia    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  Performed by Dr. Zebedee Iba on 08/01/16:  29 mm commercially available Medtronic Core Valve transcatheter aortic valve procedure placed via transfemoral approach by Dr. Zebedee Iba and co-surgeon Dr. Winona Legato.   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 MOHS.   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/07/2021   Right zygoma, EDC 05/02/21   Supplemental oxygen dependent    3-4 L/[place]   Supplemental oxygen dependent    T2DM (type 2 diabetes mellitus) (HCC)    no meds     Past Surgical History:  Procedure Laterality Date   AORTIC VALVE REPLACEMENT N/A 08/01/2016   29 mm CoreValve Evolut; Location: [facility]; Surgeon: Clent Jacks, MD   CARDIAC CATHETERIZATION N/A 12/26/2012   2v  CAD; retained pigtail in myocardium --> [facility]; Location: [facility]; Surgeon: Despina Hick, MD   CATARACT EXTRACTION W/PHACO Left 10/03/2022   Procedure: CATARACT EXTRACTION PHACO AND INTRAOCULAR LENS PLACEMENT (IOC) LEFT DIABETIC 8.22 00:55.5;  Surgeon: Galen Manila, MD;  Location: [facility];  Service: Ophthalmology;  Laterality: Left;  sleep apnea   CATARACT EXTRACTION W/PHACO Right 10/17/2022   Procedure: CATARACT EXTRACTION PHACO AND INTRAOCULAR LENS PLACEMENT (IOC) RIGHT DIABETIC;  Surgeon: Galen Manila, MD;  Location: [facility];  Service: Ophthalmology;  Laterality: Right;  4.79 0:37.4   COLONOSCOPY     COLONOSCOPY N/A 04/28/2023   Procedure: COLONOSCOPY;  Surgeon: Toledo, Boykin Nearing, MD;  Location: [facility];  Service: Gastroenterology;  Laterality: N/A;   KNEE ARTHROSCOPY WITH MEDIAL MENISECTOMY Left 12/02/2020   Procedure: KNEE ARTHROSCOPY WITH DEBRIDEMENT AND PARTIAL MEDIAL AND LATERAL MENISECTOMY;  Surgeon: Christena Flake, MD;  Location: [facility];  Service: Orthopedics;  Laterality: Left;   LITHOTRIPSY     PERCUTANEOUS REMOVAL INTRA-AORTIC BALLOON CATH N/A 12/26/2012   Procedure: PERCUTANEOUS REMOVAL INTRA-AORTIC BALLOON CATH; Surgeon: Gerilyn Pilgrim  Glenis Smoker, MD; Location: [facility]; Service: Cardiothoracic   POLYPECTOMY  04/28/2023   Procedure: POLYPECTOMY;  Surgeon: Norma Fredrickson, Boykin Nearing, MD;  Location: [facility];  Service: Gastroenterology;;   RIGHT HEART CATH Right 07/13/2016   Location: [facility]; Surgeon: Emilio Math, MD   TEE WITHOUT CARDIOVERSION Right 04/26/2023   Procedure: TRANSESOPHAGEAL ECHOCARDIOGRAM (TEE);  Surgeon: Laurier Nancy, MD;  Location: [facility];  Service: Cardiovascular;  Laterality: Right;   TRANSESOPHAGEAL ECHOCARDIOGRAM N/A 12/26/2012   Procedure: TRANSESOPHAGEAL ECHOCARDIOGRAPHY; Surgeon: Heloise Ochoa, MD; Location: [facility]; Service: Cardiothoracic   UMBILICAL HERNIA REPAIR N/A 02/13/2014    Procedure: LAPAROSCOPIC UMBILICAL HERNIA REPAIR; Surgeon: Merlinda Frederick, MD; Location: [facility]; Service: General Surgery    Social History   Socioeconomic History   Marital status: Married    Spouse name: Jsiah Volkers   Number of children: 2   Years of education: Not on file   Highest education level: Not on file  Occupational History   Occupation: retired  Tobacco Use   Smoking status: Former    Current packs/day: 0.00    Average packs/day: 2.0 packs/day for 54.0 years (108.0 ttl pk-yrs)    Types: Cigarettes    Start date: 73    Quit date: 2010    Years since quitting: 15.0   Smokeless tobacco: Former    Types: Chew    Quit date: 05/16/2004  Vaping Use   Vaping status: Never Used  Substance and Sexual Activity   Alcohol use: No   Drug use: Never   Sexual activity: Not on file  Other Topics Concern   Not on file  Social History Narrative   Not on file   Social Drivers of Health   Financial Resource Strain: Low Risk  (03/09/2023)   Overall Financial Resource Strain (CARDIA)    Difficulty of Paying Living Expenses: Not hard at all  Food Insecurity: No Food Insecurity (05/02/2023)   Hunger Vital Sign    Worried About Running Out of Food in the Last Year: Never true    Ran Out of Food in the Last Year: Never true  Transportation Needs: No Transportation Needs (05/02/2023)   PRAPARE - Administrator, Civil Service (Medical): No    Lack of Transportation (Non-Medical): No  Physical Activity: Inactive (03/09/2023)   Exercise Vital Sign    Days of Exercise per Week: 0 days    Minutes of Exercise per Session: 0 min  Stress: No Stress Concern Present (03/09/2023)   [employer] of Occupational Health - Occupational Stress Questionnaire    Feeling of Stress : Not at all  Social Connections: Moderately Integrated (04/24/2023)   Social Connection and Isolation Panel [NHANES]    Frequency of Communication with Friends and Family: More than  three times a week    Frequency of Social Gatherings with Friends and Family: More than three times a week    Attends Religious Services: More than 4 times per year    Active Member of [employer] or Organizations: No    Attends Banker Meetings: Never    Marital Status: Married  Catering manager Violence: Not At Risk (05/02/2023)   Humiliation, Afraid, Rape, and Kick questionnaire    Fear of Current or Ex-Partner: No    Emotionally Abused: No    Physically Abused: No    Sexually Abused: No    Family History  Adopted: Yes  Problem Relation Age of Onset   Varicose Veins Daughter  Obesity Daughter    Hyperlipidemia Daughter    Diabetes Daughter    COPD Daughter    Depression Daughter    Asthma Daughter    Arthritis Son    Diabetes Son    Hypertension Son      Current Outpatient Medications:    REPATHA SURECLICK 140 MG/ML SOAJ, Inject into the skin., Disp: , Rfl:    acetaminophen (TYLENOL) 650 MG CR tablet, Take 1,300 mg by mouth every 8 (eight) hours as needed for pain., Disp: , Rfl:    albuterol (VENTOLIN HFA) 108 (90 Base) MCG/ACT inhaler, Inhale 2 puffs into the lungs every 6 (six) hours as needed for wheezing or shortness of breath., Disp: , Rfl:    Alcohol Swabs (DROPSAFE ALCOHOL PREP) 70 % PADS, Apply 1 each topically as needed (With repatha and blood sugar checks.)., Disp: , Rfl:    Ascorbic Acid (VITAMIN C) 1000 MG tablet, Take 1,000 mg by mouth at bedtime., Disp: , Rfl:    busPIRone (BUSPAR) 5 MG tablet, TAKE 1 TABLET BY MOUTH TWICE DAILY AS NEEDED, Disp: 60 tablet, Rfl: 3   calcitRIOL (ROCALTROL) 0.25 MCG capsule, Take 0.25 mcg by mouth daily., Disp: , Rfl:    citalopram (CELEXA) 40 MG tablet, TAKE 1 TABLET EVERY DAY, Disp: 90 tablet, Rfl: 3   colchicine 0.6 MG tablet, Take 0.3 mg by mouth daily., Disp: , Rfl:    ezetimibe (ZETIA) 10 MG tablet, TAKE 1 TABLET(10 MG) BY MOUTH AT BEDTIME, Disp: 90 tablet, Rfl: 3   febuxostat (ULORIC) 40 MG tablet, Take 40 mg by  mouth daily., Disp: , Rfl:    fexofenadine (ALLEGRA) 180 MG tablet, Take 180 mg by mouth daily as needed for allergies or rhinitis., Disp: , Rfl:    Fluticasone-Umeclidin-Vilant (TRELEGY ELLIPTA) 100-62.5-25 MCG/ACT AEPB, Inhale 1 Inhalation into the lungs daily at 6 (six) AM. (Patient taking differently: Inhale 1 Inhalation into the lungs daily as needed.), Disp: 60 each, Rfl: 11   gabapentin (NEURONTIN) 100 MG capsule, TAKE 2 CAPSULES TWICE DAILY, Disp: 360 capsule, Rfl: 3   gabapentin (NEURONTIN) 400 MG capsule, TAKE 1 CAPSULE AT BEDTIME, Disp: 90 capsule, Rfl: 3   metoprolol succinate (TOPROL-XL) 50 MG 24 hr tablet, TAKE 1 TABLET EVERY DAY, Disp: 90 tablet, Rfl: 3   Multiple Vitamins-Minerals (PRESERVISION AREDS) CAPS, Take 1 capsule by mouth 2 (two) times daily., Disp: , Rfl:    ondansetron (ZOFRAN-ODT) 4 MG disintegrating tablet, Take 1 tablet (4 mg total) by mouth every 8 (eight) hours as needed for nausea or vomiting., Disp: 20 tablet, Rfl: 0   OXYGEN, Inhale 3 L into the lungs See admin instructions. Used as needed throughout the day and continuous at bedtime, Disp: , Rfl:    pantoprazole (PROTONIX) 40 MG tablet, Take 40 mg by mouth every morning., Disp: , Rfl:    Simethicone (GAS-X PO), Take 1 tablet by mouth 2 (two) times daily as needed (flatulence)., Disp: , Rfl:    sodium chloride (OCEAN) 0.65 % SOLN nasal spray, Place 1 spray into both nostrils as needed for congestion., Disp: , Rfl: 0   SUPER B COMPLEX/C PO, Take 1 tablet by mouth daily., Disp: , Rfl:    tamsulosin (FLOMAX) 0.4 MG CAPS capsule, TAKE 1 CAPSULE EVERY DAY, Disp: 90 capsule, Rfl: 3   torsemide (DEMADEX) 20 MG tablet, Take 20 mg by mouth daily., Disp: , Rfl:   Physical exam:  Vitals:   05/09/23 1319  BP: (!) 154/65  Pulse: 72  Resp:  18  Temp: 98.6 F (37 C)  TempSrc: Tympanic  SpO2: 100%  Weight: 199 lb (90.3 kg)   Physical Exam Cardiovascular:     Rate and Rhythm: Normal rate and regular rhythm.     Heart  sounds: Normal heart sounds.  Pulmonary:     Effort: Pulmonary effort is normal.     Breath sounds: Normal breath sounds.  Abdominal:     General: Bowel sounds are normal.     Palpations: Abdomen is soft.  Skin:    General: Skin is warm and dry.  Neurological:     Mental Status: He is alert and oriented to person, place, and time.         Latest Ref Rng & Units 04/28/2023    4:22 AM  CMP  Glucose 70 - 99 mg/dL 80   BUN 8 - 23 mg/dL 82   Creatinine 2.95 - 1.24 mg/dL 2.84   Sodium 132 - 440 mmol/L 138   Potassium 3.5 - 5.1 mmol/L 4.8   Chloride 98 - 111 mmol/L 108   CO2 22 - 32 mmol/L 20   Calcium 8.9 - 10.3 mg/dL 8.5       Latest Ref Rng & Units 05/09/2023    1:11 PM  CBC  WBC 4.0 - 10.5 K/uL 4.8   Hemoglobin 13.0 - 17.0 g/dL 9.6   Hematocrit 10.2 - 52.0 % 29.8   Platelets 150 - 400 K/uL 125     No images are attached to the encounter.  ECHO TEE Result Date: 04/26/2023    TRANSESOPHOGEAL ECHO REPORT   Patient Name:   SAFWAN SIROKY Date of Exam: 04/26/2023 Medical Rec #:  [id number]               Height:       63.0 in Accession #:    [id number]              Weight:       196.9 lb Date of Birth:  November 11, 1945              BSA:          1.921 m Patient Age:    77 years                BP:           177/79 mmHg Patient Gender: M                       HR:           72 bpm. Exam Location:  [facility] Procedure: Transesophageal Echo, Cardiac Doppler and Color Doppler Indications:     Endocarditis  History:         Patient has prior history of Echocardiogram examinations, most                  recent 04/24/2023. COPD; Risk Factors:Diabetes.                  Aortic Valve: valve is present in the aortic position.                  Procedure Date: 5 years ago.  Sonographer:     Cristela Blue Referring Phys:  [id number] [facility] A K[facility] Diagnosing Phys: Adrian Blackwater PROCEDURE: The transesophogeal probe was passed without difficulty through the esophogus of the patient. Sedation performed by different  physician. The patient developed no complications during the procedure.  IMPRESSIONS  1.  Left ventricular ejection fraction, by estimation, is 40 to 45%. The left ventricle has mildly decreased function. The left ventricle demonstrates global hypokinesis. The left ventricular internal cavity size was mildly dilated. There is mild left ventricular hypertrophy.  2. Right ventricular systolic function is mildly reduced. The right ventricular size is mildly enlarged. Mildly increased right ventricular wall thickness.  3. Smoke present. Left atrial size was moderately dilated. No left atrial/left atrial appendage thrombus was detected.  4. Right atrial size was moderately dilated.  5. The mitral valve is normal in structure. Mild to moderate mitral valve regurgitation.  6. Tricuspid valve regurgitation is mild to moderate.  7. The aortic valve has been repaired/replaced. Aortic valve regurgitation is mild. Aortic valve sclerosis/calcification is present, without any evidence of aortic stenosis. There is a valve present in the aortic position. Procedure Date: 5 years ago. Conclusion(s)/Recommendation(s): No evidence of vegetation/infective endocarditis on this transesophageael echocardiogram. FINDINGS  Left Ventricle: Left ventricular ejection fraction, by estimation, is 40 to 45%. The left ventricle has mildly decreased function. The left ventricle demonstrates global hypokinesis. The left ventricular internal cavity size was mildly dilated. There is  mild left ventricular hypertrophy. Right Ventricle: The right ventricular size is mildly enlarged. Mildly increased right ventricular wall thickness. Right ventricular systolic function is mildly reduced. Left Atrium: Smoke present. Left atrial size was moderately dilated. Spontaneous echo contrast was present. No left atrial/left atrial appendage thrombus was detected. Right Atrium: Right atrial size was moderately dilated. Pericardium: There is no evidence of pericardial  effusion. Mitral Valve: The mitral valve is normal in structure. Mild to moderate mitral valve regurgitation. Tricuspid Valve: The tricuspid valve is normal in structure. Tricuspid valve regurgitation is mild to moderate. Aortic Valve: The aortic valve has been repaired/replaced. Aortic valve regurgitation is mild. Aortic valve sclerosis/calcification is present, without any evidence of aortic stenosis. There is a valve present in the aortic position. Procedure Date: 5 years ago. Pulmonic Valve: The pulmonic valve was grossly normal. Pulmonic valve regurgitation is mild to moderate. Aorta: The aortic root, ascending aorta and aortic arch are all structurally normal, with no evidence of dilitation or obstruction. IAS/Shunts: No atrial level shunt detected by color flow Doppler. Adrian Blackwater Electronically signed by Adrian Blackwater Signature Date/Time: 04/26/2023/9:57:04 AM    Final    ECHOCARDIOGRAM COMPLETE Result Date: 04/24/2023    ECHOCARDIOGRAM REPORT   Patient Name:   RICHEY SORIANO Date of Exam: 04/24/2023 Medical Rec #:  [id number]               Height:       63.0 in Accession #:    [id number]              Weight:       199.1 lb Date of Birth:  Nov 13, 1945              BSA:          1.930 m Patient Age:    77 years                BP:           166/72 mmHg Patient Gender: M                       HR:           70 bpm. Exam Location:  [facility] Procedure: 2D Echo, Cardiac Doppler and Color Doppler Indications:     Bacteremia  History:  Patient has prior history of Echocardiogram examinations, most                  recent 07/28/2020. Signs/Symptoms:Dyspnea; Risk                  Factors:Hypertension and Diabetes. 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 Valves replaced at [facility] in 2017 per                  patient.  Sonographer:     Mikki Harbor Referring Phys:  [id number] Lynn Ito Diagnosing  Phys: Debbe Odea MD IMPRESSIONS  1. Left ventricular ejection fraction, by estimation, is 50%. The left ventricle has low normal function. The left ventricle has no regional wall motion abnormalities. There is mild left ventricular hypertrophy. Left ventricular diastolic parameters are  indeterminate.  2. Right ventricular systolic function is low normal. The right ventricular size is normal. There is mildly elevated pulmonary artery systolic pressure.  3. Left atrial size was severely dilated.  4. The mitral valve is degenerative. Mild to moderate mitral valve regurgitation. Moderate mitral stenosis. The mean mitral valve gradient is 7.5 mmHg. Moderate mitral annular calcification.  5. The aortic valve has been repaired/replaced. Aortic valve regurgitation is not visualized. Aortic valve mean gradient measures 12.0 mmHg.  6. Aortic dilatation noted. There is mild dilatation of the aortic root, measuring 41 mm. FINDINGS  Left Ventricle: Left ventricular ejection fraction, by estimation, is 50%. The left ventricle has low normal function. The left ventricle has no regional wall motion abnormalities. The left ventricular internal cavity size was normal in size. There is mild left ventricular hypertrophy. Left ventricular diastolic parameters are indeterminate. Right Ventricle: The right ventricular size is normal. No increase in right ventricular wall thickness. Right ventricular systolic function is low normal. There is mildly elevated pulmonary artery systolic pressure. The tricuspid regurgitant velocity is 2.79 m/s, and with an assumed right atrial pressure of 8 mmHg, the estimated right ventricular systolic pressure is 39.1 mmHg. Left Atrium: Left atrial size was severely dilated. Right Atrium: Right atrial size was normal in size. Pericardium: There is no evidence of pericardial effusion. Mitral Valve: The mitral valve is degenerative in appearance. Moderate mitral annular calcification. Mild to moderate  mitral valve regurgitation. Moderate mitral valve stenosis. MV peak gradient, 18.1 mmHg. The mean mitral valve gradient is 7.5 mmHg. Tricuspid Valve: The tricuspid valve is normal in structure. Tricuspid valve regurgitation is mild. Aortic Valve: The aortic valve has been repaired/replaced. Aortic valve regurgitation is not visualized. Aortic valve mean gradient measures 12.0 mmHg. Aortic valve peak gradient measures 21.1 mmHg. Aortic valve area, by VTI measures 1.76 cm. Pulmonic Valve: The pulmonic valve was not well visualized. Pulmonic valve regurgitation is not visualized. Aorta: Aortic dilatation noted. There is mild dilatation of the aortic root, measuring 41 mm. Venous: The inferior vena cava was not well visualized. IAS/Shunts: No atrial level shunt detected by color flow Doppler.  LEFT VENTRICLE PLAX 2D LVIDd:         5.50 cm      Diastology LVIDs:         4.00 cm      LV e' lateral:   6.74 cm/s LV PW:  1.30 cm      LV E/e' lateral: 24.6 LV IVS:        1.40 cm LVOT diam:     2.00 cm LV SV:         102 LV SV Index:   53 LVOT Area:     3.14 cm  LV Volumes (MOD) LV vol d, MOD A2C: 113.0 ml LV vol d, MOD A4C: 91.9 ml LV vol s, MOD A2C: 59.1 ml LV vol s, MOD A4C: 51.3 ml LV SV MOD A2C:     53.9 ml LV SV MOD A4C:     91.9 ml LV SV MOD BP:      44.7 ml RIGHT VENTRICLE RV Basal diam:  3.60 cm RV Mid diam:    2.90 cm RV S prime:     12.70 cm/s TAPSE (M-mode): 2.1 cm LEFT ATRIUM              Index        RIGHT ATRIUM           Index LA diam:        4.90 cm  2.54 cm/m   RA Area:     17.10 cm LA Vol (A2C):   137.0 ml 70.99 ml/m  RA Volume:   46.10 ml  23.89 ml/m LA Vol (A4C):   122.0 ml 63.22 ml/m LA Biplane Vol: 134.0 ml 69.44 ml/m  AORTIC VALVE                     PULMONIC VALVE AV Area (Vmax):    1.83 cm      PV Vmax:       1.07 m/s AV Area (Vmean):   1.71 cm      PV Peak grad:  4.6 mmHg AV Area (VTI):     1.76 cm AV Vmax:           229.50 cm/s AV Vmean:          161.000 cm/s AV VTI:             0.579 m AV Peak Grad:      21.1 mmHg AV Mean Grad:      12.0 mmHg LVOT Vmax:         134.00 cm/s LVOT Vmean:        87.600 cm/s LVOT VTI:          0.325 m LVOT/AV VTI ratio: 0.56  AORTA Ao Root diam: 4.10 cm MITRAL VALVE                  TRICUSPID VALVE MV Area (PHT): 4.60 cm       TR Peak grad:   31.1 mmHg MV Area VTI:   1.86 cm       TR Vmax:        279.00 cm/s MV Peak grad:  18.1 mmHg MV Mean grad:  7.5 mmHg       SHUNTS MV Vmax:       2.12 m/s       Systemic VTI:  0.32 m MV Vmean:      129.0 cm/s     Systemic Diam: 2.00 cm MV Decel Time: 165 msec MR Peak grad:    136.0 mmHg MR Mean grad:    102.0 mmHg MR Vmax:         583.00 cm/s MR Vmean:        485.0 cm/s MR PISA:  0.57 cm MR PISA Eff ROA: 9 mm MR PISA Radius:  0.30 cm MV E velocity: 166.00 cm/s MV A velocity: 178.00 cm/s MV E/A ratio:  0.93 Debbe Odea MD Electronically signed by Debbe Odea MD Signature Date/Time: 04/24/2023/2:16:38 PM    Final    DG Chest Port 1 View Result Date: 04/22/2023 CLINICAL DATA:  Possible sepsis EXAM: PORTABLE CHEST 1 VIEW COMPARISON:  09/06/2022 FINDINGS: Cardiac shadow is prominent. 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 Aortic calcifications are noted. Lungs are well aerated bilaterally. Mild central vascular prominence is again seen without edema. No focal infiltrate is noted. No bony abnormality is seen. IMPRESSION: No acute abnormality noted. Electronically Signed   By: Alcide Clever M.D.   On: 04/22/2023 00:21     Assessment and plan- Patient is a 78 y.o. male here for routine follow-up of anemia of chronic kidney  disease  Patient's hemoglobin which typically runs between 10.5-11.5 dipped down to 9 when he was hospitalized.  Presently it is 10.6.  He does not require any initiation of EPO at this time.  Iron saturation is 21% with a ferritin of 98 and he does not require any IV iron either.  I will continue to monitor his CBC ferritin and iron studies in 3 and 6 months and see him back in 6 months    Visit Diagnosis 1. Anemia of chronic kidney failure, stage 4 (severe) (HCC)   2. Iron deficiency anemia, unspecified iron deficiency anemia type      Dr. Owens Shark, MD, MPH [facility] at [facility] [phone] 05/10/2023 10:41 AM

## 2023-05-15 ENCOUNTER — Ambulatory Visit: Payer: Self-pay

## 2023-05-15 NOTE — Patient Instructions (Signed)
Visit Information  Thank you for taking time to visit with me today. Please don't hesitate to contact me if I can be of assistance to you.   Following are the goals we discussed today:   Goals Addressed             This Visit's Progress    Post hospital follow up management and education and overall management of chronic health conditions.       Interventions Today    Flowsheet Row Most Recent Value  Chronic Disease   Chronic disease during today's visit Chronic Obstructive Pulmonary Disease (COPD), Congestive Heart Failure (CHF), Chronic Kidney Disease/End Stage Renal Disease (ESRD), Other  [mouth pain]  General Interventions   General Interventions Discussed/Reviewed General Interventions Reviewed, Doctor Visits  [evaluation of current treatment plan for listed health conditions and patients adherence to plan as established by provider. Assessed for HF/ COPD symptoms and blood pressure readings.]  Doctor Visits Discussed/Reviewed Doctor Visits Reviewed  Annabell Sabal upcoming provider visits. Advised to keep follow up visits with providers as recommended. confirmed patient has follow up appointment with nephrologist.]  Education Interventions   Education Provided Provided Education  [Avised to notify primary care provider of ongoing buring inside mouth and on tongue.]  Provided Verbal Education On Other  [Advised to monitor blood pressure daily and record. Advised to take recorded blood pressures to next follow up nephrology visit. Advised to keep legs elevated with sitting and laying down.]  Pharmacy Interventions   Pharmacy Dicussed/Reviewed Pharmacy Topics Reviewed  [medications reviewed and compliance discussed. Discussed medication spironolactone added to treatment plan]               Our next appointment is by telephone on 06/14/23 at 2 pm  Please call the care guide team at [phone] if you need to cancel or reschedule your appointment.   If you are experiencing a  Mental Health or Behavioral Health Crisis or need someone to talk to, please call the Suicide and Crisis Lifeline: 988 call 1-800-273-TALK (toll free, 24 hour hotline)  Patient verbalizes understanding of instructions and care plan provided today and agrees to view in MyChart. Active MyChart status and patient understanding of how to access instructions and care plan via MyChart confirmed with patient.     George Ina RN, BSN, CCM [employer], Population Health Case Manager Phone: [phone]

## 2023-05-15 NOTE — Patient Outreach (Signed)
  Care Coordination   Follow Up Visit Note   05/15/2023 Name: [name] [name] MRN: [id number] DOB: Feb 17, 1946  [name] [name] is a 78 y.o. year old male who sees Miki Kins, FNP for primary care. I spoke with  Melinda Crutch and wife Karston Loguidice by phone today.  What matters to the patients health and wellness today?  Patient states overall he feels he is doing much better.  Patient states he is still having trouble with the inside of his mouth and tongue burning from when he was in the hospital. He states the doctor sprayed something in his mouth before having TEE done. Patient states the inside of his mouth has burned since that time. He reports burning occurs when he brushes his teeth and with certain foods and drink.  Patient reports talking with his primary provider about this at hospital follow up visit on 05/07/23.  He states burning sensation continues. Patient states the swelling in his legs have decreased slightly since taking newly prescribed. Spironolactone.    Goals Addressed             This Visit's Progress    Post hospital follow up management and education and overall management of chronic health conditions.       Interventions Today    Flowsheet Row Most Recent Value  Chronic Disease   Chronic disease during today's visit Chronic Obstructive Pulmonary Disease (COPD), Congestive Heart Failure (CHF), Chronic Kidney Disease/End Stage Renal Disease (ESRD), Other  [mouth pain]  General Interventions   General Interventions Discussed/Reviewed General Interventions Reviewed, Doctor Visits  [evaluation of current treatment plan for listed health conditions and patients adherence to plan as established by provider. Assessed for HF/ COPD symptoms and blood pressure readings.]  Doctor Visits Discussed/Reviewed Doctor Visits Reviewed  Annabell Sabal upcoming provider visits. Advised to keep follow up visits with providers as recommended. confirmed  patient has follow up appointment with nephrologist.]  Education Interventions   Education Provided Provided Education  [Avised to notify primary care provider of ongoing buring inside mouth and on tongue.]  Provided Verbal Education On Other  [Advised to monitor blood pressure daily and record. Advised to take recorded blood pressures to next follow up nephrology visit. Advised to keep legs elevated with sitting and laying down.]  Pharmacy Interventions   Pharmacy Dicussed/Reviewed Pharmacy Topics Reviewed  [medications reviewed and compliance discussed. Discussed medication spironolactone added to treatment plan]               SDOH assessments and interventions completed:  No     Care Coordination Interventions:  Yes, provided   Follow up plan: Follow up call scheduled for 06/14/23 at 2 pm    Encounter Outcome:  Patient Visit Completed   George Ina RN, BSN, CCM [place]  [name], Population Health Case Manager Phone: [phone]

## 2023-05-30 ENCOUNTER — Other Ambulatory Visit (INDEPENDENT_AMBULATORY_CARE_PROVIDER_SITE_OTHER): Payer: Self-pay | Admitting: Nurse Practitioner

## 2023-05-30 DIAGNOSIS — N184 Chronic kidney disease, stage 4 (severe): Secondary | ICD-10-CM

## 2023-05-31 ENCOUNTER — Ambulatory Visit: Payer: HMO | Attending: Infectious Diseases | Admitting: Infectious Diseases

## 2023-05-31 ENCOUNTER — Other Ambulatory Visit
Admission: RE | Admit: 2023-05-31 | Discharge: 2023-05-31 | Disposition: A | Discharge status: Home / Self Care | Payer: HMO | Source: Ambulatory Visit | Attending: Infectious Diseases | Admitting: Infectious Diseases

## 2023-05-31 ENCOUNTER — Encounter: Payer: Self-pay | Admitting: Infectious Diseases

## 2023-05-31 VITALS — BP 159/81 | HR 66 | Temp 97.0°F | Ht 65.0 in | Wt 197.0 lb

## 2023-05-31 DIAGNOSIS — R7881 Bacteremia: Secondary | ICD-10-CM | POA: Insufficient documentation

## 2023-05-31 DIAGNOSIS — B954 Other streptococcus as the cause of diseases classified elsewhere: Secondary | ICD-10-CM | POA: Diagnosis not present

## 2023-05-31 DIAGNOSIS — E039 Hypothyroidism, unspecified: Secondary | ICD-10-CM | POA: Insufficient documentation

## 2023-05-31 DIAGNOSIS — I5032 Chronic diastolic (congestive) heart failure: Secondary | ICD-10-CM | POA: Insufficient documentation

## 2023-05-31 DIAGNOSIS — J449 Chronic obstructive pulmonary disease, unspecified: Secondary | ICD-10-CM | POA: Diagnosis not present

## 2023-05-31 DIAGNOSIS — K635 Polyp of colon: Secondary | ICD-10-CM | POA: Insufficient documentation

## 2023-05-31 DIAGNOSIS — E1122 Type 2 diabetes mellitus with diabetic chronic kidney disease: Secondary | ICD-10-CM | POA: Insufficient documentation

## 2023-05-31 DIAGNOSIS — Z87891 Personal history of nicotine dependence: Secondary | ICD-10-CM | POA: Insufficient documentation

## 2023-05-31 DIAGNOSIS — B955 Unspecified streptococcus as the cause of diseases classified elsewhere: Secondary | ICD-10-CM | POA: Insufficient documentation

## 2023-05-31 DIAGNOSIS — M109 Gout, unspecified: Secondary | ICD-10-CM | POA: Diagnosis not present

## 2023-05-31 DIAGNOSIS — I2510 Atherosclerotic heart disease of native coronary artery without angina pectoris: Secondary | ICD-10-CM | POA: Insufficient documentation

## 2023-05-31 DIAGNOSIS — Z952 Presence of prosthetic heart valve: Secondary | ICD-10-CM | POA: Insufficient documentation

## 2023-05-31 DIAGNOSIS — N189 Chronic kidney disease, unspecified: Secondary | ICD-10-CM | POA: Diagnosis not present

## 2023-05-31 LAB — CBC WITH DIFFERENTIAL/PLATELET
Abs Immature Granulocytes: 0.08 10*3/uL — ABNORMAL HIGH (ref 0.00–0.07)
Basophils Absolute: 0 10*3/uL (ref 0.0–0.1)
Basophils Relative: 0 %
Eosinophils Absolute: 0.1 10*3/uL (ref 0.0–0.5)
Eosinophils Relative: 1 %
HCT: 32 % — ABNORMAL LOW (ref 39.0–52.0)
Hemoglobin: 10.5 g/dL — ABNORMAL LOW (ref 13.0–17.0)
Immature Granulocytes: 1 %
Lymphocytes Relative: 33 %
Lymphs Abs: 2.6 10*3/uL (ref 0.7–4.0)
MCH: 31.6 pg (ref 26.0–34.0)
MCHC: 32.8 g/dL (ref 30.0–36.0)
MCV: 96.4 fL (ref 80.0–100.0)
Monocytes Absolute: 0.8 10*3/uL (ref 0.1–1.0)
Monocytes Relative: 11 %
Neutro Abs: 4.2 10*3/uL (ref 1.7–7.7)
Neutrophils Relative %: 54 %
Platelets: 158 10*3/uL (ref 150–400)
RBC: 3.32 MIL/uL — ABNORMAL LOW (ref 4.22–5.81)
RDW: 14.3 % (ref 11.5–15.5)
WBC: 7.8 10*3/uL (ref 4.0–10.5)
nRBC: 0 % (ref 0.0–0.2)

## 2023-05-31 LAB — COMPREHENSIVE METABOLIC PANEL
ALT: 9 U/L (ref 0–44)
AST: 16 U/L (ref 15–41)
Albumin: 3.1 g/dL — ABNORMAL LOW (ref 3.5–5.0)
Alkaline Phosphatase: 70 U/L (ref 38–126)
Anion gap: 12 (ref 5–15)
BUN: 55 mg/dL — ABNORMAL HIGH (ref 8–23)
CO2: 22 mmol/L (ref 22–32)
Calcium: 8.6 mg/dL — ABNORMAL LOW (ref 8.9–10.3)
Chloride: 106 mmol/L (ref 98–111)
Creatinine, Ser: 4.24 mg/dL — ABNORMAL HIGH (ref 0.61–1.24)
GFR, Estimated: 14 mL/min — ABNORMAL LOW (ref >60)
Glucose, Bld: 77 mg/dL (ref 70–99)
Potassium: 4.8 mmol/L (ref 3.5–5.1)
Sodium: 140 mmol/L (ref 135–145)
Total Bilirubin: 0.3 mg/dL (ref 0.0–1.2)
Total Protein: 6.6 g/dL (ref 6.5–8.1)

## 2023-05-31 NOTE — Patient Instructions (Signed)
 [name] [name], you had a follow-up appointment today after your recent hospitalization for Streptococcus gallolyticus bacteremia. You completed your antibiotics and your tests, including blood cultures and echocardiograms, were normal. A colonoscopy showed non-cancerous polyps. You are feeling well but mentioned sleeping a lot.  YOUR PLAN:  -STREPTOCOCCUS GALLOLYTICUS BACTEREMIA: Streptococcus gallolyticus bacteremia is a bacterial infection in the blood. You completed your antibiotics, and your tests were normal. We discussed the importance of dental health to prevent future infections. A blood culture will be ordered to confirm the infection is cleared. Inform your dentist about your recent bacteremia and ensure there are no dental abscesses or rotting teeth. INSTRUCTIONS:  Follow up with your primary care provider on June 11, 2023. We will  call with you with blood culture results.

## 2023-05-31 NOTE — Progress Notes (Signed)
 NAME: [name] [name]  DOB: [birth year]  MRN: [id number]  Date/Time: 05/31/2023 10:50 AM   Subjective:  ?  [name] [name] is a 78 year old male who presents for follow-up after treatment for Streptococcus gallolyticus bacteremia.  He was recently hospitalized and discharged on January 4th after being treated for Streptococcus gallolyticus and  amoxicillin  antibiotics, which he finished on January 12th. Blood cultures taken during hospitalization were negative, and both a 2D echocardiogram and transesophageal echocardiogram (TEE) were normal. A colonoscopy performed during the hospital stay revealed three or four polyps, which were removed and found to be non-cancerous.( hyperplastic X1, adenomax 2)  His medical history includes aortic valve replacement via TAVR, kidney issues, gout, diabetes, coronary artery disease, heart failure with preserved ejection fraction, hypothyroidism, and COPD. No current dental abscesses, although he has 'bad teeth' and sees a dentist every six months. He is scheduled for a dental cleaning next month.  His current medications include Buspar , Calcitriol , Celexa , colchicine  as needed for gout, Zetia , Uloric , Trelegy inhaler, gabapentin , metoprolol , Repatha injections, spironolactone , and torsemide . He uses oxygen  as needed and has not smoked in 25 years. He reports feeling well since his discharge, although he mentions sleeping a lot.   Past Medical History  - Bacterial infection in blood (Streptococcus agalactiae) - Kidney issues - Gout - Diabetes - Coronary artery disease - Heart failure with preserved ejection fraction - Low thyroid  - COPD - Polyps (3 or 4) on colonoscopy (removed, non-cancerous) - Teeth issues (plaques) - Hypercholesterolemia   Past Surgical History:  Procedure Laterality Date   AORTIC VALVE REPLACEMENT N/A 08/01/2016   29 mm CoreValve Evolut; Location: [facility]; Surgeon: Reyes Fruits, MD   CARDIAC CATHETERIZATION  N/A 12/26/2012   2v CAD; retained pigtail in myocardium --> transferred to [facility]; Location: [facility]; Surgeon: Vita Bathe, MD   CATARACT EXTRACTION W/PHACO Left 10/03/2022   Procedure: CATARACT EXTRACTION PHACO AND INTRAOCULAR LENS PLACEMENT (IOC) LEFT DIABETIC 8.22 00:55.5;  Surgeon: Jaye Fallow, MD;  Location[facility];  Service: Ophthalmology;  Laterality: Left;  sleep apnea   CATARACT EXTRACTION W/PHACO Right 10/17/2022   Procedure: CATARACT EXTRACTION PHACO AND INTRAOCULAR LENS PLACEMENT (IOC) RIGHT DIABETIC;  Surgeon: Jaye Fallow, MD;  Location: [facility];  Service: Ophthalmology;  Laterality: Right;  4.79 0:37.4   COLONOSCOPY     COLONOSCOPY N/A 04/28/2023   Procedure: COLONOSCOPY;  Surgeon: Toledo, Ladell POUR, MD;  Location: [facility] ENDOSCOPY;  Service: Gastroenterology;  Laterality: N/A;   KNEE ARTHROSCOPY WITH MEDIAL MENISECTOMY Left 12/02/2020   Procedure: KNEE ARTHROSCOPY WITH DEBRIDEMENT AND PARTIAL MEDIAL AND LATERAL MENISECTOMY;  Surgeon: Edie Norleen PARAS, MD;  Location: [facility] ORS;  Service: Orthopedics;  Laterality: Left;   LITHOTRIPSY     PERCUTANEOUS REMOVAL INTRA-AORTIC BALLOON CATH N/A 12/26/2012   Procedure: PERCUTANEOUS REMOVAL INTRA-AORTIC BALLOON CATH; Surgeon: Lang Lannie Salter, MD; Location: [facility]; Service: Cardiothoracic   POLYPECTOMY  04/28/2023   Procedure: POLYPECTOMY;  Surgeon: Aundria, Ladell POUR, MD;  Location: [facility] ENDOSCOPY;  Service: Gastroenterology;;   RIGHT HEART CATH Right 07/13/2016   Location: [facility]; Surgeon: Ozell Estelle, MD   TEE WITHOUT CARDIOVERSION Right 04/26/2023   Procedure: TRANSESOPHAGEAL ECHOCARDIOGRAM (TEE);  Surgeon: Bathe Denyse LABOR, MD;  Location: [facility] ORS;  Service: Cardiovascular;  Laterality: Right;   TRANSESOPHAGEAL ECHOCARDIOGRAM N/A 12/26/2012   Procedure: TRANSESOPHAGEAL ECHOCARDIOGRAPHY; Surgeon: Lang Lannie Salter, MD; Location: [facility]; Service: Cardiothoracic   UMBILICAL HERNIA REPAIR  N/A 02/13/2014   Procedure: LAPAROSCOPIC UMBILICAL HERNIA REPAIR; Surgeon: Shanda T  Arnie, MD; Location: [facility]; Service: General Surgery    Social History   Socioeconomic History   Marital status: Married    Spouse name: Anwar Sakata   Number of children: 2   Years of education: Not on file   Highest education level: Not on file  Occupational History   Occupation: retired  Tobacco Use   Smoking status: Former    Current packs/day: 0.00    Average packs/day: 2.0 packs/day for 54.0 years (108.0 ttl pk-yrs)    Types: Cigarettes    Start date: 50    Quit date: 2010    Years since quitting: 15.1   Smokeless tobacco: Former    Types: Chew    Quit date: 05/16/2004  Vaping Use   Vaping status: Never Used  Substance and Sexual Activity   Alcohol use: No   Drug use: Never   Sexual activity: Not on file  Other Topics Concern   Not on file  Social History Narrative   Not on file   Social Drivers of Health   Financial Resource Strain: Low Risk  (03/09/2023)   Overall Financial Resource Strain (CARDIA)    Difficulty of Paying Living Expenses: Not hard at all  Food Insecurity: No Food Insecurity (05/02/2023)   Hunger Vital Sign    Worried About Running Out of Food in the Last Year: Never true    Ran Out of Food in the Last Year: Never true  Transportation Needs: No Transportation Needs (05/02/2023)   PRAPARE - Administrator, Civil Service (Medical): No    Lack of Transportation (Non-Medical): No  Physical Activity: Inactive (03/09/2023)   Exercise Vital Sign    Days of Exercise per Week: 0 days    Minutes of Exercise per Session: 0 min  Stress: No Stress Concern Present (03/09/2023)   [employer] of Occupational Health - Occupational Stress Questionnaire    Feeling of Stress : Not at all  Social Connections: Moderately Integrated (04/24/2023)   Social Connection and Isolation Panel [NHANES]    Frequency of Communication with Friends and  Family: More than three times a week    Frequency of Social Gatherings with Friends and Family: More than three times a week    Attends Religious Services: More than 4 times per year    Active Member of [employer] or Organizations: No    Attends Banker Meetings: Never    Marital Status: Married  Catering Manager Violence: Not At Risk (05/02/2023)   Humiliation, Afraid, Rape, and Kick questionnaire    Fear of Current or Ex-Partner: No    Emotionally Abused: No    Physically Abused: No    Sexually Abused: No    Family History  Adopted: Yes  Problem Relation Age of Onset   Varicose Veins Daughter    Obesity Daughter    Hyperlipidemia Daughter    Diabetes Daughter    COPD Daughter    Depression Daughter    Asthma Daughter    Arthritis Son    Diabetes Son    Hypertension Son    Allergies  Allergen Reactions   Nsaids Other (See Comments)    Avoid due to kidney disease    Statins Other (See Comments)    Myalgias   Nexletol [Bempedoic Acid] Diarrhea    fatigue   Silver Other (See Comments)   Tegaderm High Gelling Alginate [Wound Dressings] Rash   Medications - Buspar  - Calcitriol  - Celexa  - Colchicine  (as needed) - Zetia  -  Farxiga - Uloric  - Trelegy - Gabapentin  - Metoprolol  - Repatha (injection for cholesterol) - Spironolactone  (diuretic/water  pill) - Torsemide  - Oxygen  (as needed)    Abtx:  Anti-infectives (From admission, onward)    None       REVIEW OF SYSTEMS:  Const: negative fever, negative chills, negative weight loss Eyes: negative diplopia or visual changes, negative eye pain ENT: negative coryza, negative sore throat Resp: negative cough, hemoptysis, dyspnea Cards: negative for chest pain, palpitations, lower extremity edema GU: negative for frequency, dysuria and hematuria GI: Negative for abdominal pain, diarrhea, bleeding, constipation Skin: negative for rash and pruritus Heme: negative for easy bruising and gum/nose bleeding MS:  negative for myalgias, arthralgias, back pain and muscle weakness Neurolo:negative for headaches, dizziness, vertigo, memory problems  Psych: negative for feelings of anxiety, depression  Endocrine: negative for thyroid , diabetes Allergy/Immunology- as above Objective:  VITALS:  BP (!) 159/81   Pulse 66   Temp (!) 97 F (36.1 C) (Temporal)   Ht 5' 5 (1.651 m)   Wt 197 lb (89.4 kg)   SpO2 94%   BMI 32.78 kg/m  LDA Foley Central line Other drainage tubes PHYSICAL EXAM:  General: Alert, cooperative, no distress, appears stated age.  Head: Normocephalic, without obvious abnormality, atraumatic. Eyes: Conjunctivae clear, anicteric sclerae. Pupils are equal ENT Nares normal. No drainage or sinus tenderness. Lips, mucosa, and tongue normal. No Thrush Neck: Supple, symmetrical, no adenopathy, thyroid : non tender no carotid bruit and no JVD. Back: No CVA tenderness. Lungs: Clear to auscultation bilaterally. No Wheezing or Rhonchi. No rales. Heart: Regular rate and rhythm, no murmur, rub or gallop. Abdomen: Soft, non-tender,not distended. Bowel sounds normal. No masses Extremities: atraumatic, no cyanosis. No edema. No clubbing Skin: No rashes or lesions. Or bruising Lymph: Cervical, supraclavicular normal. Neurologic: Grossly non-focal Pertinent Labs Lab Results LABS Blood culture: Negative (04/28/2023)  DIAGNOSTIC 2D echocardiogram: Normal (04/28/2023) Transesophageal echocardiogram (TEE): Normal (04/28/2023) Colonoscopy: Three polyps removed, no malignancy (04/28/2023)  ? Impression/Recommendation ?Streptococcus gallolyticus bacteremia Recently hospitalized for Streptococcus gallolyticus bacteremia, he completed ceftriaxone  on May 06, 2023. Blood cultures and TEE were negative, and a colonoscopy showed non-cancerous polyps. No dental abscesses were noted. Potential sources of bacteremia, including colon and teeth, were discussed. Routine dental check-ups occur every six  months, with the next cleaning scheduled for next month. Emphasized the importance of dental health to prevent infection. Order a blood culture to confirm clearance. Inform the dentist about the recent bacteremia and ensure no dental abscesses or rotting teeth.   Aortic valve replacement (TAVR) TAVR is without current issues. Monitor for signs of infection or complications.  Chronic kidney disease Chronic kidney disease presents no specific current concerns. Continue current management and follow up with PCP.  Gout Gout is managed with colchicine  and febuxostat  as needed. Continue current medications as needed.  Diabetes mellitus Diabetes presents no specific current concerns. Continue current management and follow up with PCP.  Coronary artery disease Coronary artery disease presents no specific current concerns. Continue current medications and follow up with PCP.  Heart failure with preserved ejection fraction Heart failure with preserved ejection fraction presents no specific current concerns. Continue current medications and follow up with PCP.  Hypothyroidism Hypothyroidism presents no specific current concerns. Continue current medications and follow up with PCP.  Chronic obstructive pulmonary disease (COPD) COPD is managed with Trelegy inhaler, with no specific current concerns. Continue inhaler use as needed.  General Health Maintenance Routine dental check-ups occur every six months, with the next cleaning  scheduled for next month. Inform the dentist about the recent bacteremia and ensure no dental abscesses or rotting teeth.  Follow-up Follow up with PCP on June 11, 2023.  I will Call with blood culture results. ? ? ________________________________________________ Discussed with patient, and his wife Follow PRN  Note:  This document was prepared using Dragon voice recognition software and may include unintentional dictation errors.

## 2023-06-01 DIAGNOSIS — H353211 Exudative age-related macular degeneration, right eye, with active choroidal neovascularization: Secondary | ICD-10-CM | POA: Diagnosis not present

## 2023-06-02 NOTE — H&P (View-Only) (Signed)
 MRN : [id number]  [name] [name] is a 78 y.o. (20-Oct-1945) male who presents with chief complaint of check access.  History of Present Illness:   The patient is seen for evaluation for dialysis access. 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 The patient's most recent creatinine clearance is less than 20 (BUN/Cr 55/4.24, (Cr Clearance of 18) done 05/31/2023). The patient volume status has not yet become an issue. Patient's blood pressures been relatively well controlled. There are mild uremic symptoms which appear to be relatively well tolerated at this time.  The patient notes the kidney problem has been present for a long time and has been progressively getting worse.  The patient is followed by nephrology.    The patient is right-handed.  The patient has been considering the various methods of dialysis and wishes to proceed with hemodialysis and therefore creation of AV access is indicated.  No recent shortening of the patient's walking distance or new symptoms consistent with claudication.  No history of rest pain symptoms. No new ulcers or wounds of the lower extremities have occurred.  The patient denies amaurosis fugax or recent TIA symptoms. There are no recent neurological changes noted. There is no history of DVT, PE or superficial thrombophlebitis. No recent episodes of angina or shortness of breath documented.   Vein mapping performed here today demonstrates the right cephalic vein is 6.2 mm at the antecubital fossa and the left cephalic vein is 2.5 mm at the antecubital fossa.  Arterial inflow is triphasic bilaterally  No outpatient medications have been marked as taking for the 06/04/23 encounter (Appointment) with Gilda Crease, Latina Craver, MD.    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 post. helix. 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   CAD (coronary artery disease)    CAP (community acquired pneumonia) 07/26/2020   CKD (chronic kidney disease), stage IV (HCC)    Complete tear of right rotator cuff 11/15/2015   Complex tear of lateral meniscus of left knee as current injury 12/06/2020   Complex tear of medial meniscus of left knee as current injury 10/18/2020   COPD (chronic obstructive pulmonary disease) (HCC)    DM (diabetes mellitus), type 2 (HCC) 07/30/2020   no meds   DOE (dyspnea on exertion)    GERD (gastroesophageal reflux disease)    Gout    Grade I diastolic dysfunction    Heart murmur    HFrEF (heart failure with reduced ejection fraction) (HCC)    History of hiatal hernia    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  Performed by Dr. Zebedee Iba on 08/01/16:  29 mm commercially available Medtronic Core Valve transcatheter aortic valve procedure placed via transfemoral approach by Dr. Zebedee Iba and co-surgeon Dr. Winona Legato.   SCC (squamous cell carcinoma) 12/05/2022   left superior forehead,   Mohs 02/20/23   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 MOHS.   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/07/2021   Right zygoma, EDC 05/02/21   Supplemental oxygen dependent    3-4 L/[place]   Supplemental oxygen dependent    T2DM (type 2 diabetes mellitus) (HCC)    no meds    Past Surgical History:  Procedure Laterality Date   AORTIC VALVE REPLACEMENT N/A 08/01/2016   29 mm CoreValve Evolut; Location: [facility]; Surgeon: Clent Jacks, MD   CARDIAC CATHETERIZATION N/A 12/26/2012   2v CAD; retained pigtail in myocardium --> transferred to [facility]; Location: [facility]; Surgeon: Despina Hick, MD   CATARACT EXTRACTION W/PHACO Left 10/03/2022   Procedure: CATARACT EXTRACTION PHACO AND INTRAOCULAR LENS PLACEMENT (IOC) LEFT DIABETIC 8.22 00:55.5;  Surgeon: Galen Manila, MD;  Location: [facility];  Service: Ophthalmology;  Laterality: Left;  sleep apnea   CATARACT EXTRACTION W/PHACO Right 10/17/2022   Procedure: CATARACT EXTRACTION PHACO AND INTRAOCULAR LENS PLACEMENT (IOC) RIGHT DIABETIC;  Surgeon: Galen Manila, MD;  Location: [facility];  Service: Ophthalmology;  Laterality: Right;  4.79 0:37.4   COLONOSCOPY     COLONOSCOPY N/A 04/28/2023   Procedure: COLONOSCOPY;  Surgeon: Toledo, Boykin Nearing, MD;  Location: [facility];  Service: Gastroenterology;  Laterality: N/A;   KNEE ARTHROSCOPY WITH MEDIAL MENISECTOMY Left 12/02/2020   Procedure: KNEE ARTHROSCOPY WITH DEBRIDEMENT AND PARTIAL MEDIAL AND LATERAL MENISECTOMY;  Surgeon: Christena Flake, MD;  Location: [facility];  Service: Orthopedics;  Laterality: Left;   LITHOTRIPSY     PERCUTANEOUS REMOVAL INTRA-AORTIC BALLOON CATH N/A 12/26/2012   Procedure: PERCUTANEOUS REMOVAL INTRA-AORTIC BALLOON CATH; Surgeon: Heloise Ochoa, MD; Location: [facility]; Service: Cardiothoracic   POLYPECTOMY  04/28/2023   Procedure: POLYPECTOMY;   Surgeon: Norma Fredrickson, Boykin Nearing, MD;  Location: [facility];  Service: Gastroenterology;;   RIGHT HEART CATH Right 07/13/2016   Location: [facility]; Surgeon: Emilio Math, MD   TEE WITHOUT CARDIOVERSION Right 04/26/2023   Procedure: TRANSESOPHAGEAL ECHOCARDIOGRAM (TEE);  Surgeon: Laurier Nancy, MD;  Location: [facility];  Service: Cardiovascular;  Laterality: Right;   TRANSESOPHAGEAL ECHOCARDIOGRAM N/A 12/26/2012   Procedure: TRANSESOPHAGEAL ECHOCARDIOGRAPHY; Surgeon: Heloise Ochoa, MD; Location: [facility]; Service: Cardiothoracic   UMBILICAL HERNIA REPAIR N/A 02/13/2014   Procedure: LAPAROSCOPIC UMBILICAL HERNIA REPAIR; Surgeon: Merlinda Frederick, MD; Location: [facility]; Service: General Surgery    Social History Social History   Tobacco Use   Smoking status: Former    Current packs/day: 0.00    Average packs/day: 2.0 packs/day for 54.0 years (108.0 ttl pk-yrs)    Types: Cigarettes    Start date: 59    Quit date: 2010    Years since quitting: 15.1   Smokeless tobacco: Former    Types: Chew    Quit date: 05/16/2004  Vaping Use   Vaping status: Never Used  Substance Use  Topics   Alcohol use: No   Drug use: Never    Family History Family History  Adopted: Yes  Problem Relation Age of Onset   Varicose Veins Daughter    Obesity Daughter    Hyperlipidemia Daughter    Diabetes Daughter    COPD Daughter    Depression Daughter    Asthma Daughter    Arthritis Son    Diabetes Son    Hypertension Son     Allergies  Allergen Reactions   Nsaids Other (See Comments)    Avoid due to kidney disease    Statins Other (See Comments)    Myalgias   Nexletol [Bempedoic Acid] Diarrhea    fatigue   Silver Other (See Comments)   Tegaderm High Gelling Alginate [Wound Dressings] Rash     REVIEW OF SYSTEMS (Negative unless checked)  Constitutional: [] Weight loss  [] Fever  [] Chills Cardiac: [] Chest pain   [] Chest pressure   [] Palpitations   [] Shortness of breath  when laying flat   [] Shortness of breath with exertion. Vascular:  [] Pain in legs with walking   [] Pain in legs at rest  [] History of DVT   [] Phlebitis   [] Swelling in legs   [] Varicose veins   [] Non-healing ulcers Pulmonary:   [] Uses home oxygen   [] Productive cough   [] Hemoptysis   [] Wheeze  [] COPD   [] Asthma Neurologic:  [] Dizziness   [] Seizures   [] History of stroke   [] History of TIA  [] Aphasia   [] Vissual changes   [] Weakness or numbness in arm   [] Weakness or numbness in leg Musculoskeletal:   [] Joint swelling   [] Joint pain   [] Low back pain Hematologic:  [] Easy bruising  [] Easy bleeding   [] Hypercoagulable state   [] Anemic Gastrointestinal:  [] Diarrhea   [] Vomiting  [] Gastroesophageal reflux/heartburn   [] Difficulty swallowing. Genitourinary:  [x] Chronic kidney disease   [] Difficult urination  [] Frequent urination   [] Blood in urine Skin:  [] Rashes   [] Ulcers  Psychological:  [] History of anxiety   []  History of major depression.  Physical Examination  There were no vitals filed for this visit. There is no height or weight on file to calculate BMI. Gen: WD/WN, NAD Head: [place]/AT, No temporalis wasting.  Ear/Nose/Throat: Hearing grossly intact, nares w/o erythema or drainage Eyes: PER, EOMI, sclera nonicteric.  Neck: Supple, no gross masses or lesions.  No JVD.  Pulmonary:  Good air movement, no audible wheezing, no use of accessory muscles.  Cardiac: RRR, precordium non-hyperdynamic. Vascular:   The cephalic vein in the right antecubital fossa is quite prominent.  The cephalic vein in the left antecubital fossa is quite small. Vessel Right Left  Radial Palpable Palpable  Brachial Palpable Palpable  Gastrointestinal: soft, non-distended. No guarding/no peritoneal signs.  Musculoskeletal: M/S 5/5 throughout.  No deformity.  Neurologic: CN 2-12 intact. Pain and light touch intact in extremities.  Symmetrical.  Speech is fluent. Motor exam as listed above. Psychiatric: Judgment intact,  Mood & affect appropriate for pt's clinical situation. Dermatologic: No rashes or ulcers noted.  No changes consistent with cellulitis.   CBC Lab Results  Component Value Date   WBC 7.8 05/31/2023   HGB 10.5 (L) 05/31/2023   HCT 32.0 (L) 05/31/2023   MCV 96.4 05/31/2023   PLT 158 05/31/2023    BMET    Component Value Date/Time   NA 140 05/31/2023 1131   NA 142 03/06/2023 0942   NA 138 07/27/2014 1247   K 4.8 05/31/2023 1131   K 4.2 07/27/2014 1247  CL 106 05/31/2023 1131   CL 105 07/27/2014 1247   CO2 22 05/31/2023 1131   CO2 26 07/27/2014 1247   GLUCOSE 77 05/31/2023 1131   GLUCOSE 135 (H) 07/27/2014 1247   BUN 55 (H) 05/31/2023 1131   BUN 47 (H) 03/06/2023 0942   BUN 28 (H) 07/27/2014 1247   CREATININE 4.24 (H) 05/31/2023 1131   CREATININE 1.64 (H) 07/27/2014 1247   CALCIUM 8.6 (L) 05/31/2023 1131   CALCIUM 8.7 (L) 07/27/2014 1247   GFRNONAA 14 (L) 05/31/2023 1131   GFRNONAA 42 (L) 07/27/2014 1247   GFRAA 23 (L) 07/24/2016 1919   GFRAA 48 (L) 07/27/2014 1247   Estimated Creatinine Clearance: 14.5 mL/min (A) (by C-G formula based on SCr of 4.24 mg/dL (H)).  COAG Lab Results  Component Value Date   INR 1.1 04/21/2023   INR 1.0 07/27/2014    Radiology No results found.   Assessment/Plan 1. CKD (chronic kidney disease), stage IV (HCC) Recommend:  At this time the patient does not have appropriate extremity access for dialysis  Patient should have a right brachial cephalic fistula created.  The importance of using the best vein for creating a fistula was discussed in detail and given the significant discrepancy between the left cephalic vein in the right cephalic vein.  We have both decided to use the right cephalic vein for fistula creation  The risks, benefits and alternative therapies were reviewed in detail with the patient.  All questions were answered.  The patient agrees to proceed with surgery.   The patient will follow up with me in the office  after the surgery.  2. Chronic respiratory failure with hypoxia (HCC) (Primary) Patient will require clearance and I will contact Dr. Meredeth Ide at [place] [facility] - Ambulatory referral to Pulmonology  3. Coronary artery disease involving native coronary artery of native heart with other form of angina pectoris [facility]) Patient will require clearance and I will contact Dr. [name] - Ambulatory referral to Cardiology  4. Type 2 diabetes mellitus with stage 4 chronic kidney disease, without long-term current use of insulin (HCC) Continue hypoglycemic medications as already ordered, these medications have been reviewed and there are no changes at this time.  Hgb A1C to be monitored as already arranged by primary service  5. Essential hypertension Continue antihypertensive medications as already ordered, these medications have been reviewed and there are no changes at this time.    Levora Dredge, MD  06/02/2023 3:00 PM

## 2023-06-02 NOTE — Progress Notes (Signed)
 MRN : [id number]  [name] [name] is a 78 y.o. (20-Oct-1945) male who presents with chief complaint of check access.  History of Present Illness:   The patient is seen for evaluation for dialysis access. 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 The patient's most recent creatinine clearance is less than 20 (BUN/Cr 55/4.24, (Cr Clearance of 18) done 05/31/2023). The patient volume status has not yet become an issue. Patient's blood pressures been relatively well controlled. There are mild uremic symptoms which appear to be relatively well tolerated at this time.  The patient notes the kidney problem has been present for a long time and has been progressively getting worse.  The patient is followed by nephrology.    The patient is right-handed.  The patient has been considering the various methods of dialysis and wishes to proceed with hemodialysis and therefore creation of AV access is indicated.  No recent shortening of the patient's walking distance or new symptoms consistent with claudication.  No history of rest pain symptoms. No new ulcers or wounds of the lower extremities have occurred.  The patient denies amaurosis fugax or recent TIA symptoms. There are no recent neurological changes noted. There is no history of DVT, PE or superficial thrombophlebitis. No recent episodes of angina or shortness of breath documented.   Vein mapping performed here today demonstrates the right cephalic vein is 6.2 mm at the antecubital fossa and the left cephalic vein is 2.5 mm at the antecubital fossa.  Arterial inflow is triphasic bilaterally  No outpatient medications have been marked as taking for the 06/04/23 encounter (Appointment) with Gilda Crease, Latina Craver, MD.    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 post. helix. 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   CAD (coronary artery disease)    CAP (community acquired pneumonia) 07/26/2020   CKD (chronic kidney disease), stage IV (HCC)    Complete tear of right rotator cuff 11/15/2015   Complex tear of lateral meniscus of left knee as current injury 12/06/2020   Complex tear of medial meniscus of left knee as current injury 10/18/2020   COPD (chronic obstructive pulmonary disease) (HCC)    DM (diabetes mellitus), type 2 (HCC) 07/30/2020   no meds   DOE (dyspnea on exertion)    GERD (gastroesophageal reflux disease)    Gout    Grade I diastolic dysfunction    Heart murmur    HFrEF (heart failure with reduced ejection fraction) (HCC)    History of hiatal hernia    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  Performed by Dr. Zebedee Iba on 08/01/16:  29 mm commercially available Medtronic Core Valve transcatheter aortic valve procedure placed via transfemoral approach by Dr. Zebedee Iba and co-surgeon Dr. Winona Legato.   SCC (squamous cell carcinoma) 12/05/2022   left superior forehead,   Mohs 02/20/23   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 MOHS.   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/07/2021   Right zygoma, EDC 05/02/21   Supplemental oxygen dependent    3-4 L/[place]   Supplemental oxygen dependent    T2DM (type 2 diabetes mellitus) (HCC)    no meds    Past Surgical History:  Procedure Laterality Date   AORTIC VALVE REPLACEMENT N/A 08/01/2016   29 mm CoreValve Evolut; Location: [facility]; Surgeon: Clent Jacks, MD   CARDIAC CATHETERIZATION N/A 12/26/2012   2v CAD; retained pigtail in myocardium --> transferred to [facility]; Location: [facility]; Surgeon: Despina Hick, MD   CATARACT EXTRACTION W/PHACO Left 10/03/2022   Procedure: CATARACT EXTRACTION PHACO AND INTRAOCULAR LENS PLACEMENT (IOC) LEFT DIABETIC 8.22 00:55.5;  Surgeon: Galen Manila, MD;  Location: [facility];  Service: Ophthalmology;  Laterality: Left;  sleep apnea   CATARACT EXTRACTION W/PHACO Right 10/17/2022   Procedure: CATARACT EXTRACTION PHACO AND INTRAOCULAR LENS PLACEMENT (IOC) RIGHT DIABETIC;  Surgeon: Galen Manila, MD;  Location: [facility];  Service: Ophthalmology;  Laterality: Right;  4.79 0:37.4   COLONOSCOPY     COLONOSCOPY N/A 04/28/2023   Procedure: COLONOSCOPY;  Surgeon: Toledo, Boykin Nearing, MD;  Location: [facility];  Service: Gastroenterology;  Laterality: N/A;   KNEE ARTHROSCOPY WITH MEDIAL MENISECTOMY Left 12/02/2020   Procedure: KNEE ARTHROSCOPY WITH DEBRIDEMENT AND PARTIAL MEDIAL AND LATERAL MENISECTOMY;  Surgeon: Christena Flake, MD;  Location: [facility];  Service: Orthopedics;  Laterality: Left;   LITHOTRIPSY     PERCUTANEOUS REMOVAL INTRA-AORTIC BALLOON CATH N/A 12/26/2012   Procedure: PERCUTANEOUS REMOVAL INTRA-AORTIC BALLOON CATH; Surgeon: Heloise Ochoa, MD; Location: [facility]; Service: Cardiothoracic   POLYPECTOMY  04/28/2023   Procedure: POLYPECTOMY;   Surgeon: Norma Fredrickson, Boykin Nearing, MD;  Location: [facility];  Service: Gastroenterology;;   RIGHT HEART CATH Right 07/13/2016   Location: [facility]; Surgeon: Emilio Math, MD   TEE WITHOUT CARDIOVERSION Right 04/26/2023   Procedure: TRANSESOPHAGEAL ECHOCARDIOGRAM (TEE);  Surgeon: Laurier Nancy, MD;  Location: [facility];  Service: Cardiovascular;  Laterality: Right;   TRANSESOPHAGEAL ECHOCARDIOGRAM N/A 12/26/2012   Procedure: TRANSESOPHAGEAL ECHOCARDIOGRAPHY; Surgeon: Heloise Ochoa, MD; Location: [facility]; Service: Cardiothoracic   UMBILICAL HERNIA REPAIR N/A 02/13/2014   Procedure: LAPAROSCOPIC UMBILICAL HERNIA REPAIR; Surgeon: Merlinda Frederick, MD; Location: [facility]; Service: General Surgery    Social History Social History   Tobacco Use   Smoking status: Former    Current packs/day: 0.00    Average packs/day: 2.0 packs/day for 54.0 years (108.0 ttl pk-yrs)    Types: Cigarettes    Start date: 59    Quit date: 2010    Years since quitting: 15.1   Smokeless tobacco: Former    Types: Chew    Quit date: 05/16/2004  Vaping Use   Vaping status: Never Used  Substance Use  Topics   Alcohol use: No   Drug use: Never    Family History Family History  Adopted: Yes  Problem Relation Age of Onset   Varicose Veins Daughter    Obesity Daughter    Hyperlipidemia Daughter    Diabetes Daughter    COPD Daughter    Depression Daughter    Asthma Daughter    Arthritis Son    Diabetes Son    Hypertension Son     Allergies  Allergen Reactions   Nsaids Other (See Comments)    Avoid due to kidney disease    Statins Other (See Comments)    Myalgias   Nexletol [Bempedoic Acid] Diarrhea    fatigue   Silver Other (See Comments)   Tegaderm High Gelling Alginate [Wound Dressings] Rash     REVIEW OF SYSTEMS (Negative unless checked)  Constitutional: [] Weight loss  [] Fever  [] Chills Cardiac: [] Chest pain   [] Chest pressure   [] Palpitations   [] Shortness of breath  when laying flat   [] Shortness of breath with exertion. Vascular:  [] Pain in legs with walking   [] Pain in legs at rest  [] History of DVT   [] Phlebitis   [] Swelling in legs   [] Varicose veins   [] Non-healing ulcers Pulmonary:   [] Uses home oxygen   [] Productive cough   [] Hemoptysis   [] Wheeze  [] COPD   [] Asthma Neurologic:  [] Dizziness   [] Seizures   [] History of stroke   [] History of TIA  [] Aphasia   [] Vissual changes   [] Weakness or numbness in arm   [] Weakness or numbness in leg Musculoskeletal:   [] Joint swelling   [] Joint pain   [] Low back pain Hematologic:  [] Easy bruising  [] Easy bleeding   [] Hypercoagulable state   [] Anemic Gastrointestinal:  [] Diarrhea   [] Vomiting  [] Gastroesophageal reflux/heartburn   [] Difficulty swallowing. Genitourinary:  [x] Chronic kidney disease   [] Difficult urination  [] Frequent urination   [] Blood in urine Skin:  [] Rashes   [] Ulcers  Psychological:  [] History of anxiety   []  History of major depression.  Physical Examination  There were no vitals filed for this visit. There is no height or weight on file to calculate BMI. Gen: WD/WN, NAD Head: [place]/AT, No temporalis wasting.  Ear/Nose/Throat: Hearing grossly intact, nares w/o erythema or drainage Eyes: PER, EOMI, sclera nonicteric.  Neck: Supple, no gross masses or lesions.  No JVD.  Pulmonary:  Good air movement, no audible wheezing, no use of accessory muscles.  Cardiac: RRR, precordium non-hyperdynamic. Vascular:   The cephalic vein in the right antecubital fossa is quite prominent.  The cephalic vein in the left antecubital fossa is quite small. Vessel Right Left  Radial Palpable Palpable  Brachial Palpable Palpable  Gastrointestinal: soft, non-distended. No guarding/no peritoneal signs.  Musculoskeletal: M/S 5/5 throughout.  No deformity.  Neurologic: CN 2-12 intact. Pain and light touch intact in extremities.  Symmetrical.  Speech is fluent. Motor exam as listed above. Psychiatric: Judgment intact,  Mood & affect appropriate for pt's clinical situation. Dermatologic: No rashes or ulcers noted.  No changes consistent with cellulitis.   CBC Lab Results  Component Value Date   WBC 7.8 05/31/2023   HGB 10.5 (L) 05/31/2023   HCT 32.0 (L) 05/31/2023   MCV 96.4 05/31/2023   PLT 158 05/31/2023    BMET    Component Value Date/Time   NA 140 05/31/2023 1131   NA 142 03/06/2023 0942   NA 138 07/27/2014 1247   K 4.8 05/31/2023 1131   K 4.2 07/27/2014 1247  CL 106 05/31/2023 1131   CL 105 07/27/2014 1247   CO2 22 05/31/2023 1131   CO2 26 07/27/2014 1247   GLUCOSE 77 05/31/2023 1131   GLUCOSE 135 (H) 07/27/2014 1247   BUN 55 (H) 05/31/2023 1131   BUN 47 (H) 03/06/2023 0942   BUN 28 (H) 07/27/2014 1247   CREATININE 4.24 (H) 05/31/2023 1131   CREATININE 1.64 (H) 07/27/2014 1247   CALCIUM 8.6 (L) 05/31/2023 1131   CALCIUM 8.7 (L) 07/27/2014 1247   GFRNONAA 14 (L) 05/31/2023 1131   GFRNONAA 42 (L) 07/27/2014 1247   GFRAA 23 (L) 07/24/2016 1919   GFRAA 48 (L) 07/27/2014 1247   Estimated Creatinine Clearance: 14.5 mL/min (A) (by C-G formula based on SCr of 4.24 mg/dL (H)).  COAG Lab Results  Component Value Date   INR 1.1 04/21/2023   INR 1.0 07/27/2014    Radiology No results found.   Assessment/Plan 1. CKD (chronic kidney disease), stage IV (HCC) Recommend:  At this time the patient does not have appropriate extremity access for dialysis  Patient should have a right brachial cephalic fistula created.  The importance of using the best vein for creating a fistula was discussed in detail and given the significant discrepancy between the left cephalic vein in the right cephalic vein.  We have both decided to use the right cephalic vein for fistula creation  The risks, benefits and alternative therapies were reviewed in detail with the patient.  All questions were answered.  The patient agrees to proceed with surgery.   The patient will follow up with me in the office  after the surgery.  2. Chronic respiratory failure with hypoxia (HCC) (Primary) Patient will require clearance and I will contact Dr. Meredeth Ide at [place] [facility] - Ambulatory referral to Pulmonology  3. Coronary artery disease involving native coronary artery of native heart with other form of angina pectoris [facility]) Patient will require clearance and I will contact Dr. [name] - Ambulatory referral to Cardiology  4. Type 2 diabetes mellitus with stage 4 chronic kidney disease, without long-term current use of insulin (HCC) Continue hypoglycemic medications as already ordered, these medications have been reviewed and there are no changes at this time.  Hgb A1C to be monitored as already arranged by primary service  5. Essential hypertension Continue antihypertensive medications as already ordered, these medications have been reviewed and there are no changes at this time.    Levora Dredge, MD  06/02/2023 3:00 PM

## 2023-06-04 ENCOUNTER — Ambulatory Visit (INDEPENDENT_AMBULATORY_CARE_PROVIDER_SITE_OTHER): Payer: Self-pay

## 2023-06-04 ENCOUNTER — Encounter (INDEPENDENT_AMBULATORY_CARE_PROVIDER_SITE_OTHER): Payer: Self-pay | Admitting: Vascular Surgery

## 2023-06-04 ENCOUNTER — Other Ambulatory Visit
Admission: RE | Admit: 2023-06-04 | Discharge: 2023-06-04 | Disposition: A | Discharge status: Home / Self Care | Payer: HMO | Attending: Infectious Diseases | Admitting: Infectious Diseases

## 2023-06-04 ENCOUNTER — Ambulatory Visit (INDEPENDENT_AMBULATORY_CARE_PROVIDER_SITE_OTHER): Payer: Self-pay | Admitting: Vascular Surgery

## 2023-06-04 ENCOUNTER — Ambulatory Visit (INDEPENDENT_AMBULATORY_CARE_PROVIDER_SITE_OTHER): Payer: HMO

## 2023-06-04 VITALS — BP 165/67 | HR 62 | Resp 16 | Wt 198.4 lb

## 2023-06-04 DIAGNOSIS — I129 Hypertensive chronic kidney disease with stage 1 through stage 4 chronic kidney disease, or unspecified chronic kidney disease: Secondary | ICD-10-CM | POA: Diagnosis not present

## 2023-06-04 DIAGNOSIS — E1122 Type 2 diabetes mellitus with diabetic chronic kidney disease: Secondary | ICD-10-CM

## 2023-06-04 DIAGNOSIS — N184 Chronic kidney disease, stage 4 (severe): Secondary | ICD-10-CM | POA: Diagnosis not present

## 2023-06-04 DIAGNOSIS — Z952 Presence of prosthetic heart valve: Secondary | ICD-10-CM | POA: Diagnosis not present

## 2023-06-04 DIAGNOSIS — R7881 Bacteremia: Secondary | ICD-10-CM | POA: Diagnosis not present

## 2023-06-04 DIAGNOSIS — D631 Anemia in chronic kidney disease: Secondary | ICD-10-CM | POA: Diagnosis not present

## 2023-06-04 DIAGNOSIS — J9611 Chronic respiratory failure with hypoxia: Secondary | ICD-10-CM

## 2023-06-04 DIAGNOSIS — I25118 Atherosclerotic heart disease of native coronary artery with other forms of angina pectoris: Secondary | ICD-10-CM | POA: Diagnosis not present

## 2023-06-04 DIAGNOSIS — N2581 Secondary hyperparathyroidism of renal origin: Secondary | ICD-10-CM | POA: Diagnosis not present

## 2023-06-04 DIAGNOSIS — R809 Proteinuria, unspecified: Secondary | ICD-10-CM | POA: Diagnosis not present

## 2023-06-04 DIAGNOSIS — N050 Unspecified nephritic syndrome with minor glomerular abnormality: Secondary | ICD-10-CM | POA: Diagnosis not present

## 2023-06-04 DIAGNOSIS — I10 Essential (primary) hypertension: Secondary | ICD-10-CM

## 2023-06-06 ENCOUNTER — Telehealth (INDEPENDENT_AMBULATORY_CARE_PROVIDER_SITE_OTHER): Payer: Self-pay

## 2023-06-06 NOTE — Telephone Encounter (Signed)
Spoke with the patient and he is scheduled with Dr. Gilda Crease for a right brachialcephalic fistula on 06/27/23 at the [facility]. Pre-op is scheduled on 06/15/23 at 9:00 am at the [facility]. Pre-surgical instructions were discussed and will be sent to Mychart and mailed.

## 2023-06-07 DIAGNOSIS — N184 Chronic kidney disease, stage 4 (severe): Secondary | ICD-10-CM | POA: Diagnosis not present

## 2023-06-07 DIAGNOSIS — N2581 Secondary hyperparathyroidism of renal origin: Secondary | ICD-10-CM | POA: Diagnosis not present

## 2023-06-07 DIAGNOSIS — D631 Anemia in chronic kidney disease: Secondary | ICD-10-CM | POA: Diagnosis not present

## 2023-06-07 DIAGNOSIS — N050 Unspecified nephritic syndrome with minor glomerular abnormality: Secondary | ICD-10-CM | POA: Diagnosis not present

## 2023-06-07 DIAGNOSIS — I129 Hypertensive chronic kidney disease with stage 1 through stage 4 chronic kidney disease, or unspecified chronic kidney disease: Secondary | ICD-10-CM | POA: Diagnosis not present

## 2023-06-07 DIAGNOSIS — E1122 Type 2 diabetes mellitus with diabetic chronic kidney disease: Secondary | ICD-10-CM | POA: Diagnosis not present

## 2023-06-07 DIAGNOSIS — R809 Proteinuria, unspecified: Secondary | ICD-10-CM | POA: Diagnosis not present

## 2023-06-09 LAB — CULTURE, BLOOD (ROUTINE X 2)
Culture: NO GROWTH
Culture: NO GROWTH
Special Requests: ADEQUATE

## 2023-06-11 ENCOUNTER — Encounter: Payer: Self-pay | Admitting: Family

## 2023-06-11 ENCOUNTER — Ambulatory Visit: Payer: HMO | Admitting: Family

## 2023-06-11 VITALS — BP 140/66 | HR 65 | Ht 65.0 in | Wt 202.0 lb

## 2023-06-11 DIAGNOSIS — M1A00X0 Idiopathic chronic gout, unspecified site, without tophus (tophi): Secondary | ICD-10-CM | POA: Diagnosis not present

## 2023-06-11 DIAGNOSIS — E782 Mixed hyperlipidemia: Secondary | ICD-10-CM | POA: Diagnosis not present

## 2023-06-11 DIAGNOSIS — E1122 Type 2 diabetes mellitus with diabetic chronic kidney disease: Secondary | ICD-10-CM

## 2023-06-11 DIAGNOSIS — N184 Chronic kidney disease, stage 4 (severe): Secondary | ICD-10-CM

## 2023-06-11 DIAGNOSIS — K219 Gastro-esophageal reflux disease without esophagitis: Secondary | ICD-10-CM | POA: Diagnosis not present

## 2023-06-11 DIAGNOSIS — I10 Essential (primary) hypertension: Secondary | ICD-10-CM

## 2023-06-11 DIAGNOSIS — J441 Chronic obstructive pulmonary disease with (acute) exacerbation: Secondary | ICD-10-CM

## 2023-06-11 NOTE — Progress Notes (Signed)
 Established Patient Office Visit  Subjective:  Patient ID: [name] [name], male    DOB: 1945/06/28  Age: 78 y.o. MRN: [id number]  Chief Complaint  Patient presents with   Follow-up    3 month follow up    Patient is here today for his 3 months follow up.  He has been feeling fairly well since last appointment.   He does not have additional concerns to discuss today.  Labs are not due today. He needs refills.   I have reviewed his active problem list, medication list, allergies, notes from last encounter, lab results for his appointment today.    No other concerns at this time.   Past Medical History:  Diagnosis Date   Accidental cut, puncture, perforation, or hemorrhage during heart catheterization 12/27/2012   Acute kidney injury superimposed on stage 4 chronic kidney disease (HCC) 07/27/2020   Acute on chronic combined systolic and diastolic CHF (congestive heart failure) (HCC) 04/25/2023   Acute on chronic respiratory failure with hypoxia (HCC) 04/22/2023   Adopted    AKI (acute kidney injury) (HCC) 02/05/2023   ANA positive 07/12/2015   Aortic atherosclerosis (HCC)    Aortic stenosis, severe    a.) s/p TAVR on 08/01/2016: 29 mm CoreValve Evolut placed   Arthritis    Bacteremia due to Streptococcus 04/24/2023   Basal cell carcinoma 01/04/2010   Right sup. post. helix. Excised 03/02/2010   BCC (basal cell carcinoma of skin) 11/24/2020   R neck below the ear, EDC   BPH (benign prostatic hyperplasia)    CAD (coronary artery disease) 12/26/2012   a.) LHC 12/26/2012: 50% mLAD, 80% mLCx, 100% pRCA --> CVTS at [facility] --> decided again revascularization due to insig LAD disease and the fact that RCA lesion was chronic   CAP (community acquired pneumonia) 07/26/2020   Chronic respiratory failure with hypoxia (HCC)    Claudication of both lower extremities (HCC)    Complete tear of right rotator cuff 11/15/2015   Complex tear of lateral meniscus of left knee as  current injury 12/06/2020   Complex tear of medial meniscus of left knee as current injury 10/18/2020   COPD (chronic obstructive pulmonary disease) (HCC)    Diarrhea 02/04/2023   Diet-controlled type 2 diabetes mellitus (HCC)    Diverticulosis    Dyspnea    Electrolyte abnormality 02/04/2023   ESRD (end stage renal disease) (HCC)    GERD (gastroesophageal reflux disease)    Gout    Heart murmur    HFrEF (heart failure with reduced ejection fraction) (HCC)    a.) s/p TAVR on 08/01/2016: 29 mm CoreValve Evolut placed   History of bilateral cataract extraction    History of hiatal hernia    HLD (hyperlipidemia)    Hyperkalemia 07/30/2020   Hypertension    Hypomagnesemia 02/05/2023   Hypothyroidism    IDA (iron deficiency anemia)    Migraines    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   a.) TTE 04/24/2023: moderate (MPG 7.5 mmHG)   OSA on CPAP    Pneumonia 07/2020   Pulmonary hypertension (HCC)    a.) TTE 08/17/2022: "mild pHTN" per echo report; b.) TTE 04/24/2023: RVSP 39.1   Recurrent nephrolithiasis 12/27/2012   Rotator cuff tendinitis, right 11/15/2015   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 MOHS.   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/07/2021   Right zygoma, EDC 05/02/21   Statin intolerance (myalgias)    Supplemental oxygen dependent (2-3 L/[place])     Past Surgical History:  Procedure Laterality Date   AORTIC VALVE REPLACEMENT N/A 08/01/2016   29 mm CoreValve Evolut; Location: [facility]; Surgeon: Forbes Ida, MD   AV FISTULA PLACEMENT Right 06/27/2023   Procedure: ARTERIOVENOUS (AV) FISTULA CREATION (BRACHIALCEPHALIC);   Surgeon: Jackquelyn Mass, MD;  Location: [facility];  Service: Vascular;  Laterality: Right;   CARDIAC CATHETERIZATION N/A 12/26/2012   2v CAD; retained pigtail in myocardium --> transferred to [facility]; Location: [facility]; Surgeon: Rinda Cheers, MD   CATARACT EXTRACTION W/PHACO Left 10/03/2022   Procedure: CATARACT EXTRACTION PHACO AND INTRAOCULAR LENS PLACEMENT (IOC) LEFT DIABETIC 8.22 00:55.5;  Surgeon: Clair Crews, MD;  Location: [facility];  Service: Ophthalmology;  Laterality: Left;  sleep apnea   CATARACT EXTRACTION W/PHACO Right 10/17/2022   Procedure: CATARACT EXTRACTION PHACO AND INTRAOCULAR LENS PLACEMENT (IOC) RIGHT DIABETIC;  Surgeon: Clair Crews, MD;  Location: [facility];  Service: Ophthalmology;  Laterality: Right;  4.79 0:37.4   COLONOSCOPY     COLONOSCOPY N/A 04/28/2023   Procedure: COLONOSCOPY;  Surgeon: Toledo, Alphonsus Jeans, MD;  Location: [facility];  Service: Gastroenterology;  Laterality: N/A;   KNEE ARTHROSCOPY WITH MEDIAL MENISECTOMY Left 12/02/2020   Procedure: KNEE ARTHROSCOPY WITH DEBRIDEMENT AND PARTIAL MEDIAL AND LATERAL MENISECTOMY;  Surgeon: Elner Hahn, MD;  Location: [facility];  Service: Orthopedics;  Laterality: Left;   LITHOTRIPSY     PERCUTANEOUS REMOVAL INTRA-AORTIC BALLOON CATH N/A 12/26/2012   Procedure: PERCUTANEOUS REMOVAL INTRA-AORTIC BALLOON CATH; Surgeon: Erik Havens, MD; Location: [facility]; Service: Cardiothoracic   POLYPECTOMY  04/28/2023   Procedure: POLYPECTOMY;  Surgeon: Corky Diener, Alphonsus Jeans, MD;  Location: [facility];  Service: Gastroenterology;;   RIGHT HEART CATH Right 07/13/2016   Location: [facility]; Surgeon: Toula French, MD   TEE WITHOUT CARDIOVERSION Right 04/26/2023   Procedure: TRANSESOPHAGEAL ECHOCARDIOGRAM (TEE);  Surgeon: Cherrie Cornwall, MD;  Location: [facility];  Service: Cardiovascular;  Laterality: Right;   TRANSESOPHAGEAL ECHOCARDIOGRAM N/A 12/26/2012   Procedure: TRANSESOPHAGEAL  ECHOCARDIOGRAPHY; Surgeon: Erik Havens, MD; Location: [facility]; Service: Cardiothoracic   UMBILICAL HERNIA REPAIR N/A 02/13/2014   Procedure: LAPAROSCOPIC UMBILICAL HERNIA REPAIR; Surgeon: Oma Bias, MD; Location: [facility]; Service: General Surgery    Social History   Socioeconomic History   Marital status: Married    Spouse name: Meng Winterton   Number of children: 2   Years of education: Not on file   Highest education level: Not on file  Occupational History   Occupation: retired  Tobacco Use   Smoking status: Former    Current packs/day: 0.00    Average packs/day: 2.0 packs/day for 54.0 years (108.0 ttl pk-yrs)    Types: Cigarettes    Start date: 39    Quit date: 2010    Years since quitting: 15.2   Smokeless tobacco: Former    Types: Chew    Quit date: 05/16/2004  Vaping Use   Vaping status: Never Used  Substance and Sexual Activity   Alcohol use: Yes    Comment: rare beer   Drug use: Never   Sexual activity: Not on file  Other Topics Concern   Not on file  Social History Narrative   Not on file   Social Drivers of Health   Financial Resource Strain: High Risk (07/24/2023)   Received from [facility]   Overall Financial Resource Strain (CARDIA)    Difficulty of Paying Living Expenses: Hard  Food Insecurity: No Food Insecurity (07/24/2023)   Received from [facility]   Hunger Vital Sign    Worried About Running Out of Food in the Last Year: Never true    Ran Out of Food in the Last Year: Never true  Recent Concern: Food Insecurity - Food Insecurity Present (07/12/2023)   Received from [facility]   Hunger Vital Sign    Worried About Running Out of Food in the Last Year: Sometimes true    Ran Out of Food in the Last Year: Sometimes true  Transportation Needs: No Transportation Needs (07/24/2023)   Received from [facility] - Transportation     In the past 12 months, has lack of transportation kept you from medical appointments or from getting medications?: No    Lack of Transportation (Non-Medical): No  Physical Activity: Inactive (07/18/2023)   Received from [facility]   Exercise Vital Sign    Days of Exercise per Week: 0 days    Minutes of Exercise per Session: 0 min  Stress: Stress Concern Present (07/18/2023)   Received from [facility] - Occupational Stress Questionnaire    Feeling of Stress : To some extent  Social Connections: Moderately Integrated (07/18/2023)   Received from [facility]   Social Connection and Isolation Panel [NHANES]    Frequency of Communication with Friends and Family: More than three times a week    Frequency of Social Gatherings with Friends and Family: More than three times a week    Attends Religious Services: More than 4 times per year    Active Member of [employer] or Organizations: No    Attends Banker Meetings: Never    Marital Status: Married  Catering manager Violence: Not At Risk (05/02/2023)   Humiliation, Afraid, Rape, and Kick questionnaire    Fear of Current or Ex-Partner: No    Emotionally Abused: No    Physically Abused: No    Sexually Abused: No    Family History  Adopted: Yes  Problem Relation Age of Onset   Varicose Veins Daughter    Obesity Daughter    Hyperlipidemia Daughter    Diabetes Daughter    COPD Daughter    Depression Daughter    Asthma Daughter    Arthritis Son    Diabetes Son    Hypertension Son     Allergies  Allergen Reactions   Nsaids Other (See Comments)    Avoid due to kidney disease    Statins Other (See Comments)    Myalgias   Nexletol [Bempedoic Acid] Diarrhea    fatigue   Silver Other (See Comments)   Tegaderm High Gelling Alginate [Wound Dressings] Rash    Review of Systems  All other systems reviewed and are negative.       Objective:   BP (!) 140/66   Pulse 65   Ht 5\' 5"  (1.651 m)   Wt 202 lb (91.6 kg)   SpO2 99%   BMI 33.61 kg/m   Vitals:   06/11/23 1102  BP: (!) 140/66  Pulse: 65  Height: 5\' 5"  (1.651 m)  Weight: 202 lb (91.6 kg)  SpO2: 99%  BMI (Calculated): 33.61    Physical Exam   No results found for any visits on 06/11/23.  Recent Results (from the past 2160 hours)  Comprehensive metabolic panel     Status: Abnormal   Collection Time: 05/31/23 11:31 AM  Result Value Ref Range   Sodium 140 135 - 145 mmol/L   Potassium 4.8 3.5 - 5.1 mmol/L   Chloride 106 98 - 111 mmol/L   CO2 22 22 - 32 mmol/L   Glucose, Bld 77 70 - 99 mg/dL    Comment: Glucose reference range applies only to samples taken after fasting for at least 8 hours.   BUN 55 (H) 8 - 23 mg/dL   Creatinine, Ser 1.61 (H) 0.61 - 1.24 mg/dL   Calcium 8.6 (L) 8.9 - 10.3 mg/dL   Total Protein 6.6 6.5 - 8.1 g/dL   Albumin 3.1 (L) 3.5 - 5.0 g/dL   AST 16 15 - 41 U/L   ALT 9 0 - 44 U/L   Alkaline Phosphatase 70 38 - 126 U/L   Total Bilirubin 0.3 0.0 - 1.2 mg/dL   GFR, Estimated 14 (L) >60 mL/min    Comment: (NOTE) Calculated using the CKD-EPI Creatinine Equation (2021)    Anion gap 12 5 - 15    Comment: Performed at [facility], [address] [place], [place] [address]  CBC with Differential/Platelet     Status: Abnormal   Collection Time: 05/31/23 11:31 AM  Result Value Ref Range   WBC 7.8 4.0 - 10.5 K/uL   RBC 3.32 (L) 4.22 - 5.81 MIL/uL   Hemoglobin 10.5 (L) 13.0 - 17.0 g/dL   HCT 54.0 (L) 98.1 - 19.1 %   MCV 96.4 80.0 - 100.0 fL   MCH 31.6 26.0 - 34.0 pg   MCHC 32.8 30.0 - 36.0 g/dL   RDW 47.8 29.5 - 62.1 %   Platelets 158 150 - 400 K/uL   nRBC 0.0 0.0 - 0.2 %   Neutrophils Relative % 54 %   Neutro Abs 4.2 1.7 - 7.7 K/uL   Lymphocytes Relative 33 %   Lymphs Abs 2.6 0.7 - 4.0 K/uL   Monocytes Relative 11 %   Monocytes Absolute 0.8 0.1 - 1.0 K/uL   Eosinophils Relative 1 %   Eosinophils  Absolute 0.1 0.0 - 0.5 K/uL   Basophils Relative 0 %   Basophils Absolute 0.0 0.0 - 0.1 K/uL   Immature Granulocytes 1 %   Abs Immature Granulocytes 0.08 (H) 0.00 - 0.07 K/uL    Comment: Performed at [facility], [address] [place], [place] [address]  Culture, blood (Routine X 2) w Reflex to ID Panel     Status: None   Collection Time: 06/04/23  9:21 AM   Specimen: BLOOD  Result Value Ref Range   Specimen Description BLOOD BLOOD LEFT ARM    Special Requests      BOTTLES DRAWN AEROBIC AND ANAEROBIC Blood Culture adequate volume   Culture      NO GROWTH 5 DAYS Performed at [facility], [address] [place], [place] [address]    Report Status 06/09/2023 FINAL   Culture, blood (Routine X 2) w Reflex to ID Panel     Status: None   Collection Time: 06/04/23  9:21 AM   Specimen: BLOOD  Result Value Ref Range   Specimen Description BLOOD BLOOD RIGHT HAND    Special Requests  BOTTLES DRAWN AEROBIC ONLY Blood Culture results may not be optimal due to an inadequate volume of blood received in culture bottles   Culture      NO GROWTH 5 DAYS Performed at [facility], [address] [place], [place] [address]    Report Status 06/09/2023 FINAL   Type and screen     Status: None   Collection Time: 06/15/23  2:11 PM  Result Value Ref Range   ABO/RH(D) A POS    Antibody Screen NEG    Sample Expiration 06/29/2023,2359    Extend sample reason      NO TRANSFUSIONS OR PREGNANCY IN THE PAST 3 MONTHS Performed at [facility], [address] [place], [place] [address]   Glucose, capillary     Status: None   Collection Time: 06/27/23 10:01 AM  Result Value Ref Range   Glucose-Capillary 98 70 - 99 mg/dL    Comment: Glucose reference range applies only to samples taken after fasting for at least 8 hours.  ABO/Rh     Status: None   Collection Time: 06/27/23 10:04 AM  Result Value Ref Range   ABO/RH(D)      A POS Performed at [facility], [address] [place], [place] [address]   Glucose, capillary     Status: Abnormal   Collection Time: 06/27/23 12:49 PM  Result Value Ref Range   Glucose-Capillary 113 (H) 70 - 99 mg/dL    Comment: Glucose reference range applies only to samples taken after fasting for at least 8 hours.       Assessment & Plan:   Problem List Items Addressed This Visit       Cardiovascular and Mediastinum   Essential hypertension - Primary   Blood pressure well controlled with current medications.  Continue current therapy.  Will reassess at follow up.          Respiratory   COPD with acute exacerbation (HCC)   Patient stable.  Well controlled with current therapy.   Continue current meds.          Digestive   GERD without esophagitis   Patient stable.  Well controlled with current therapy.   Continue current meds.          Endocrine   Type 2 diabetes mellitus with chronic kidney disease, without long-term current use of insulin (HCC)   Checking labs today. Will call pt. With results  Continue current diabetes POC, as patient has been well controlled on current regimen.  Will adjust meds if needed based on labs.         Other   Hyperlipidemia   Continue current therapy for lipid control. Will modify as needed based on labwork results.        Gout   Patient stable.  Well controlled with current therapy.   Continue current meds.         Return in about 4 months (around 10/09/2023) for F/U.   Total time spent: 20 minutes  Trenda Frisk, FNP  06/11/2023   This document may have been prepared by [facility] Voice Recognition software and as such may include unintentional dictation errors.

## 2023-06-14 ENCOUNTER — Other Ambulatory Visit (INDEPENDENT_AMBULATORY_CARE_PROVIDER_SITE_OTHER): Payer: Self-pay | Admitting: Nurse Practitioner

## 2023-06-14 DIAGNOSIS — N184 Chronic kidney disease, stage 4 (severe): Secondary | ICD-10-CM

## 2023-06-15 ENCOUNTER — Encounter
Admission: RE | Admit: 2023-06-15 | Discharge: 2023-06-15 | Disposition: A | Discharge status: Home / Self Care | Payer: HMO | Source: Ambulatory Visit | Attending: Vascular Surgery | Admitting: Vascular Surgery

## 2023-06-15 ENCOUNTER — Telehealth: Payer: Self-pay | Admitting: Infectious Diseases

## 2023-06-15 ENCOUNTER — Inpatient Hospital Stay: Admission: RE | Admit: 2023-06-15 | Payer: HMO | Source: Ambulatory Visit

## 2023-06-15 VITALS — BP 154/62 | HR 74 | Resp 18 | Ht 65.0 in | Wt 200.8 lb

## 2023-06-15 DIAGNOSIS — N184 Chronic kidney disease, stage 4 (severe): Secondary | ICD-10-CM | POA: Insufficient documentation

## 2023-06-15 DIAGNOSIS — Z01818 Encounter for other preprocedural examination: Secondary | ICD-10-CM | POA: Insufficient documentation

## 2023-06-15 DIAGNOSIS — Z01810 Encounter for preprocedural cardiovascular examination: Secondary | ICD-10-CM | POA: Diagnosis not present

## 2023-06-15 HISTORY — DX: Secondary hyperparathyroidism of renal origin: N25.81

## 2023-06-15 HISTORY — DX: Peripheral vascular disease, unspecified: I73.9

## 2023-06-15 HISTORY — DX: Iron deficiency anemia, unspecified: D50.9

## 2023-06-15 HISTORY — DX: Benign prostatic hyperplasia without lower urinary tract symptoms: N40.0

## 2023-06-15 HISTORY — DX: Chronic respiratory failure with hypoxia: J96.11

## 2023-06-15 LAB — TYPE AND SCREEN
ABO/RH(D): A POS
Antibody Screen: NEGATIVE

## 2023-06-15 NOTE — Patient Instructions (Addendum)
Your procedure is scheduled on:06-27-23 Wednesday Report to the Registration Desk on the 1st floor of the Medical Mall.Then proceed to the 2nd floor Surgery Desk To find out your arrival time, please call [phone] between 1PM - 3PM on:06-26-23 Tuesday If your arrival time is 6:00 am, do not arrive before that time as the Medical Mall entrance doors do not open until 6:00 am.  REMEMBER: Instructions that are not followed completely may result in serious medical risk, up to and including death; or upon the discretion of your surgeon and anesthesiologist your surgery may need to be rescheduled.  Do not eat food OR drink any liquids after midnight the night before surgery.  No gum chewing or hard candies..  One week prior to surgery:Starting on 06-20-23  Stop Anti-inflammatories (NSAIDS) such as Advil, Aleve, Ibuprofen, Motrin, Naproxen, Naprosyn and Aspirin based products such as Excedrin, Goody's Powder, BC Powder. Stop ANY OVER THE COUNTER supplements until after surgery (Vitamin C, Preservision Areds,Super B Complex)  You may however, continue to take Tylenol if needed for pain up until the day of surgery.  Continue taking all of your other prescription medications up until the day of surgery.  ON THE DAY OF SURGERY ONLY TAKE THESE MEDICATIONS WITH SIPS OF WATER: -citalopram (CELEXA)  -gabapentin (NEURONTIN)  -metoprolol succinate (TOPROL-XL)  -pantoprazole (PROTONIX)   Use your Albuterol Inhaler the day of surgery and bring your Albuterol Inhaler to the hospital  No Alcohol for 24 hours before or after surgery.  No Smoking including e-cigarettes for 24 hours before surgery.  No chewable tobacco products for at least 6 hours before surgery.  No nicotine patches on the day of surgery.  Do not use any "recreational" drugs for at least a week (preferably 2 weeks) before your surgery.  Please be advised that the combination of cocaine and anesthesia may have negative outcomes, up to  and including death. If you test positive for cocaine, your surgery will be cancelled.  On the morning of surgery brush your teeth with toothpaste and water, you may rinse your mouth with mouthwash if you wish. Do not swallow any toothpaste or mouthwash.  Use CHG Soap as directed on instruction sheet.  Bring your C-pap machine to the hospital  Do not wear jewelry, make-up, hairpins, clips or nail polish.  For welded (permanent) jewelry: bracelets, anklets, waist bands, etc.  Please have this removed prior to surgery.  If it is not removed, there is a chance that hospital personnel will need to cut it off on the day of surgery.  Do not wear lotions, powders, or perfumes.   Do not shave body hair from the neck down 48 hours before surgery.  Contact lenses, hearing aids and dentures may not be worn into surgery.  Do not bring valuables to the hospital. [facility] is not responsible for any missing/lost belongings or valuables.    Notify your doctor if there is any change in your medical condition (cold, fever, infection).  Wear comfortable clothing (specific to your surgery type) to the hospital.  After surgery, you can help prevent lung complications by doing breathing exercises.  Take deep breaths and cough every 1-2 hours. Your doctor may order a device called an Incentive Spirometer to help you take deep breaths. When coughing or sneezing, hold a pillow firmly against your incision with both hands. This is called "splinting." Doing this helps protect your incision. It also decreases belly discomfort.  If you are being admitted to the hospital overnight,  leave your suitcase in the car. After surgery it may be brought to your room.  In case of increased patient census, it may be necessary for you, the patient, to continue your postoperative care in the Same Day Surgery department.  If you are being discharged the day of surgery, you will not be allowed to drive home. You will need  a responsible individual to drive you home and stay with you for 24 hours after surgery.   If you are taking public transportation, you will need to have a responsible individual with you.  Please call the Pre-admissions Testing Dept. at [phone] if you have any questions about these instructions.  Surgery Visitation Policy:  Patients having surgery or a procedure may have two visitors.  Children under the age of 49 must have an adult with them who is not the patient.  Temporary Visitor Restrictions Due to increasing cases of flu, RSV and COVID-19: Children ages 72 and under will not be able to visit patients in [facility] hospitals under most circumstances.     Preparing for Surgery with CHLORHEXIDINE GLUCONATE (CHG) Soap  Chlorhexidine Gluconate (CHG) Soap  o An antiseptic cleaner that kills germs and bonds with the skin to continue killing germs even after washing  o Used for showering the night before surgery and morning of surgery  Before surgery, you can play an important role by reducing the number of germs on your skin.  CHG (Chlorhexidine gluconate) soap is an antiseptic cleanser which kills germs and bonds with the skin to continue killing germs even after washing.  Please do not use if you have an allergy to CHG or antibacterial soaps. If your skin becomes reddened/irritated stop using the CHG.  1. Shower the NIGHT BEFORE SURGERY and the MORNING OF SURGERY with CHG soap.  2. If you choose to wash your hair, wash your hair first as usual with your normal shampoo.  3. After shampooing, rinse your hair and body thoroughly to remove the shampoo.  4. Use CHG as you would any other liquid soap. You can apply CHG directly to the skin and wash gently with a scrungie or a clean washcloth.  5. Apply the CHG soap to your body only from the neck down. Do not use on open wounds or open sores. Avoid contact with your eyes, ears, mouth, and genitals (private parts). Wash  face and genitals (private parts) with your normal soap.  6. Wash thoroughly, paying special attention to the area where your surgery will be performed.  7. Thoroughly rinse your body with warm water.  8. Do not shower/wash with your normal soap after using and rinsing off the CHG soap.  9. Pat yourself dry with a clean towel.  10. Wear clean pajamas to bed the night before surgery.  12. Place clean sheets on your bed the night of your first shower and do not sleep with pets.  13. Shower again with the CHG soap on the day of surgery prior to arriving at the hospital.  14. Do not apply any deodorants/lotions/powders.  15. Please wear clean clothes to the hospital.

## 2023-06-15 NOTE — Telephone Encounter (Signed)
Called patient to inform his recent blood culture, that was done  to look for clearance of bacteremia  was no growth. Informed his wife as he was not available Told her to follow up with nephrology for cr 4.1 ( has CKD) and they have laready

## 2023-06-25 ENCOUNTER — Encounter: Payer: Self-pay | Admitting: Vascular Surgery

## 2023-06-25 DIAGNOSIS — E1142 Type 2 diabetes mellitus with diabetic polyneuropathy: Secondary | ICD-10-CM | POA: Diagnosis not present

## 2023-06-25 DIAGNOSIS — B351 Tinea unguium: Secondary | ICD-10-CM | POA: Diagnosis not present

## 2023-06-25 NOTE — Progress Notes (Signed)
 Perioperative / Anesthesia Services  Pre-Admission Testing Clinical Review / Pre-Operative Anesthesia Consult  Date: 06/25/23  Patient Demographics:  Name: [name] [name] DOB: [birth year] MRN:   [id number]  Planned Surgical Procedure(s):    Case: [id number] Date/Time: 06/27/23 1127   Procedure: ARTERIOVENOUS (AV) FISTULA CREATION (BRACHIALCEPHALIC) (Right)   Anesthesia type: General   Pre-op diagnosis: ESRD   Location: [facility] OR ROOM 08 / [facility]   Surgeons: Renford Dills, MD      NOTE: Available PAT nursing documentation and vital signs have been reviewed. Clinical nursing staff has updated patient's PMH/PSHx, current medication list, and drug allergies/intolerances to ensure comprehensive history available to assist in medical decision making as it pertains to the aforementioned surgical procedure and anticipated anesthetic course. Extensive review of available clinical information personally performed. [place] PMH and PSHx updated with any diagnoses/procedures that  may have been inadvertently omitted during his intake with the pre-admission testing department's nursing staff.  Clinical Discussion:  [name] [name] is a 78 y.o. male who is submitted for pre-surgical anesthesia review and clearance prior to him undergoing the above procedure. Patient is a Former Smoker (108 pack years; quit 04/2008). 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  Patient is followed by cardiology [name], MD). He was last seen by cardiology during an inpatient admission on 04/26/2023;  notes reviewed.  Patient was admitted for bacteremia due to Streptococcus, acute on chronic hypoxic respiratory failure, and AECOPD.  Patient denied any episodes of chest pain, however he was experiencing shortness of breath.  Respiratory issues chronic and patient remained on supplemental oxygen.  He denied any significant PND, orthopnea, palpitations, significant peripheral edema, vertiginous symptoms, or presyncope/syncope. Patient with a past medical history significant for cardiovascular diagnoses.   Patient underwent diagnostic LEFT heart catheterization on 12/26/2012 that revealed multivessel CAD; 50% mid LAD, 80% mid LCx, and 100% proximal RCA.  LVEF was 50%.  Procedural left ventriculogram revealed a retained pigtail within the myocardium with a small amount of hemopericardium present.  The patient remained hemodynamically stable.  Patient was emergently transferred to [facility] to have retained pigtail removed from the myocardium and to be considered for CABG procedure.     Retained pigtail successfully removed without complications at [facility].  CVTS felt that bypass would not be indicated as patient had insignificant LAD disease and his RCA lesion was deemed to be chronic.    Diagnostic RIGHT heart catheterization performed on 07/13/2016 revealed elevated right and left-sided filling pressures with marginal cardiac index. Hemodynamics: mean RA = 14 mmHg, mean PA = 45 mmHg, mean PCWP = 27 mmHg, PVR = 3.8 Wood units, cardiac index = 2.3 L/min/m  Patient underwent TAVR procedure on 08/01/2016; 29 mm CoreValve Evolut was placed.  Limited postoperative echocardiogram revealed moderate left ventricular systolic dysfunction with mild LVH.  There was no regurgitation from the replaced aortic valve; mean gradient 3 mmHg.   Myocardial perfusion imaging study was performed on 08/29/2022 revealing a normal left ventricular systolic function with an EF of 53%.  There was mild inferior wall hypokinesis.  Large  severe fixed perfusion defect with partial reversibility noted in the basal, mid, and apical inferior walls.  Findings consistent with ischemia in  the RCA territory.   TEE was performed on 04/26/2023 revealing a mildly reduced left ventricular systolic function with an EF of 40-45%.  There was global left ventricular hypokinesis.  Left ventricular cavity size was mildly dilated.  There was mild LVH.  Right ventricular size mildly enlarged with mildly reduced systolic function.  Left atrium was moderately dilated.  There was no LAA thrombus noted.  Right atrium was moderately dilated.  There was mild to moderate mitral and tricuspid valve regurgitation.  Bioprosthetic aortic valve noted to be well-seated and functioning properly.  There was mild perivalvular regurgitation.  Aortic valve sclerosis/calcification present without any evidence of aortic stenosis.  Blood pressure uncontrolled at 156/48 mmHg on currently prescribed beta-blocker (metoprolol succinate) and diuretic (torsemide + spironolactone) therapies.  Patient is on a PCSK9i (evolocumab) + ezetimibe for his HLD diagnosis and ASCVD prevention.  T2DM well-controlled with diet and lifestyle modification alone; last HgbA1c was 5.5% when checked on 03/06/2023.  Patient does have an OSAH diagnosis and is reported be compliant with prescribed nocturnal PAP therapy.  As previously mentioned, patient with a severe COPD diagnosis, causing him to be dependent on 2-3 L/[place] supplemental oxygen.  Functional capacity limited by patient's age and underlying cardiopulmonary diagnoses.  He is able to complete his ADLs/IADLs without significant cardiovascular limitation.  With that said, per the DASI, patient questionably able to achieve 4 METS of physical activity without experiencing, at least to some degree, significant angina/anginal equivalent symptoms.  No changes were made to his medication regimen.  Patient to continue regular outpatient follow-up with cardiology  for ongoing care and management of his known cardiovascular diagnoses.  [name] is scheduled for  ARTERIOVENOUS (AV) FISTULA CREATION (BRACHIALCEPHALIC) (Right) on 06/27/2023 with Dr. Levora Dredge, MD.  Given patient's past medical history significant for cardiovascular diagnoses, presurgical cardiac clearance was sought by the PAT team. Per cardiology, "this patient is optimized for surgery and may proceed with the planned procedural course with a LOW risk of significant perioperative cardiovascular complications".  In review of his medication reconciliation, the patient is not noted to be taking any type of anticoagulation or antiplatelet therapies that would need to be held during his perioperative course.  Patient denies previous perioperative complications with anesthesia in the past. In review his EMR, it is noted that patient underwent a general anesthetic course here at [facility] ([facility] IV) in 04/2023 without documented complications.      06/15/2023    1:53 PM 06/11/2023   11:02 AM 06/04/2023    2:40 PM  Vitals with BMI  Height 5\' 5"  5\' 5"    Weight 200 lbs 13 oz 202 lbs 198 lbs 6 oz  BMI 33.42 33.61 33.02  Systolic 154 140 425  Diastolic 62 66 67  Pulse 74 65 62   Providers/Specialists:  NOTE: Primary physician provider listed below. Patient may have been seen by APP or partner within same practice.   PROVIDER ROLE / SPECIALTY LAST OV  Schnier, Latina Craver, MD Vascular Surgery (Surgeon) 06/04/2023  Miki Kins, FNP Primary Care Provider 06/11/2023  Adrian Blackwater, MD Cardiology 04/26/2023  Ned Clines, MD Pulmonary Medicine 05/17/2022  Jossie Ng, MD Infectious Disease 05/31/2023  Owens Shark, MD Medical Oncology/Hematology 05/09/2023  Lamont Dowdy, MD Nephrology 06/07/2023   Allergies:   Allergies  Allergen Reactions   Nsaids Other (See Comments)    Avoid due to kidney disease    Statins Other (See  Comments)    Myalgias  Nexletol [Bempedoic Acid] Diarrhea    fatigue   Silver Other (See Comments)   Tegaderm High Gelling Alginate [Wound Dressings] Rash   Current Home Medications:   No current facility-administered medications for this encounter.    acetaminophen (TYLENOL) 650 MG CR tablet   albuterol (VENTOLIN HFA) 108 (90 Base) MCG/ACT inhaler   Ascorbic Acid (VITAMIN C) 1000 MG tablet   busPIRone (BUSPAR) 5 MG tablet   calcitRIOL (ROCALTROL) 0.25 MCG capsule   citalopram (CELEXA) 40 MG tablet   colchicine 0.6 MG tablet   Evolocumab 140 MG/ML SOAJ   ezetimibe (ZETIA) 10 MG tablet   febuxostat (ULORIC) 40 MG tablet   fexofenadine (ALLEGRA) 180 MG tablet   Fluticasone-Umeclidin-Vilant (TRELEGY ELLIPTA) 100-62.5-25 MCG/ACT AEPB   gabapentin (NEURONTIN) 100 MG capsule   gabapentin (NEURONTIN) 400 MG capsule   metoprolol succinate (TOPROL-XL) 50 MG 24 hr tablet   Multiple Vitamins-Minerals (PRESERVISION AREDS) CAPS   ondansetron (ZOFRAN-ODT) 4 MG disintegrating tablet   OXYGEN   pantoprazole (PROTONIX) 40 MG tablet   Simethicone (GAS-X PO)   sodium chloride (OCEAN) 0.65 % SOLN nasal spray   spironolactone (ALDACTONE) 25 MG tablet   SUPER B COMPLEX/C PO   torsemide (DEMADEX) 20 MG tablet   Alcohol Swabs (DROPSAFE ALCOHOL PREP) 70 % PADS   History:   Past Medical History:  Diagnosis Date   Accidental cut, puncture, perforation, or hemorrhage during heart catheterization 12/27/2012   Adopted    ANA positive 07/12/2015   Aortic stenosis, severe    a.) s/p TAVR on 08/01/2016: 29 mm CoreValve Evolut placed   Arthritis    Bacteremia due to Streptococcus 04/24/2023   Basal cell carcinoma 01/04/2010   Right sup. post. helix. Excised 03/02/2010   BCC (basal cell carcinoma of skin) 11/24/2020   R neck below the ear, EDC   BPH (benign prostatic hyperplasia)    CAD (coronary artery disease) 12/26/2012   a.) LHC 12/26/2012: 50% mLAD, 80% mLCx, 100% pRCA --> CVTS at [facility] -->  decided again revascularization due to insig LAD disease and the fact that RCA lesion was chronic   CAP (community acquired pneumonia) 07/26/2020   Chronic respiratory failure with hypoxia (HCC)    CKD (chronic kidney disease), stage IV (HCC)    Claudication of both lower extremities (HCC)    Complete tear of right rotator cuff 11/15/2015   Complex tear of lateral meniscus of left knee as current injury 12/06/2020   Complex tear of medial meniscus of left knee as current injury 10/18/2020   COPD (chronic obstructive pulmonary disease) (HCC)    Diet-controlled type 2 diabetes mellitus (HCC)    Dyspnea    GERD (gastroesophageal reflux disease)    Gout    Heart murmur    HFrEF (heart failure with reduced ejection fraction) (HCC)    a.) s/p TAVR on 08/01/2016: 29 mm CoreValve Evolut placed   History of bilateral cataract extraction    History of hiatal hernia    History of transcatheter aortic valve replacement (TAVR) 08/09/2016   HLD (hyperlipidemia)    Hypertension    Hypothyroidism    IDA (iron deficiency anemia)    Migraines    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   a.) TTE 04/24/2023: moderate (MPG 7.5 mmHG)   OSA on CPAP    Pneumonia 07/2020   Pulmonary hypertension (HCC)    a.) TTE 08/17/2022: "mild pHTN" per echo report; b.) TTE 04/24/2023: RVSP 39.1   Recurrent nephrolithiasis 12/27/2012   Right atrial dilation  Rotator cuff tendinitis, right 11/15/2015   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 MOHS.   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/07/2021   Right zygoma, EDC 05/02/21   Supplemental oxygen dependent (2-3  L/[place])    Past Surgical History:  Procedure Laterality Date   AORTIC VALVE REPLACEMENT N/A 08/01/2016   29 mm CoreValve Evolut; Location: [facility]; Surgeon: Clent Jacks, MD   CARDIAC CATHETERIZATION N/A 12/26/2012   2v CAD; retained pigtail in myocardium --> transferred to [facility]; Location: [facility]; Surgeon: Despina Hick, MD   CATARACT EXTRACTION W/PHACO Left 10/03/2022   Procedure: CATARACT EXTRACTION PHACO AND INTRAOCULAR LENS PLACEMENT (IOC) LEFT DIABETIC 8.22 00:55.5;  Surgeon: Galen Manila, MD;  Location: [facility];  Service: Ophthalmology;  Laterality: Left;  sleep apnea   CATARACT EXTRACTION W/PHACO Right 10/17/2022   Procedure: CATARACT EXTRACTION PHACO AND INTRAOCULAR LENS PLACEMENT (IOC) RIGHT DIABETIC;  Surgeon: Galen Manila, MD;  Location: [facility];  Service: Ophthalmology;  Laterality: Right;  4.79 0:37.4   COLONOSCOPY     COLONOSCOPY N/A 04/28/2023   Procedure: COLONOSCOPY;  Surgeon: Toledo, Boykin Nearing, MD;  Location: [facility] ENDOS[facility];  Service: Gastroenterology;  Laterality: N/A;   KNEE ARTHROSCOPY WITH MEDIAL MENISECTOMY Left 12/02/2020   Procedure: KNEE ARTHROSCOPY WITH DEBRIDEMENT AND PARTIAL MEDIAL AND LATERAL MENISECTOMY;  Surgeon: Christena Flake, MD;  Location: [facility] ORS;  Service: Orthopedics;  Laterality: Left;   LITHOTRIPSY     PERCUTANEOUS REMOVAL INTRA-AORTIC BALLOON CATH N/A 12/26/2012   Procedure: PERCUTANEOUS REMOVAL INTRA-AORTIC BALLOON CATH; Surgeon: Heloise Ochoa, MD; Location: [facility]; Service: Cardiothoracic   POLYPECTOMY  04/28/2023   Procedure: POLYPECTOMY;  Surgeon: Norma Fredrickson, Boykin Nearing, MD;  Location: [facility];  Service: Gastroenterology;;   RIGHT HEART CATH Right 07/13/2016   Location: [facility]; Surgeon: Emilio Math, MD   TEE WITHOUT CARDIOVERSION Right 04/26/2023   Procedure: TRANSESOPHAGEAL ECHOCARDIOGRAM (TEE);  Surgeon: Laurier Nancy, MD;  Location: [facility] ORS;  Service: Cardiovascular;  Laterality: Right;    TRANSESOPHAGEAL ECHOCARDIOGRAM N/A 12/26/2012   Procedure: TRANSESOPHAGEAL ECHOCARDIOGRAPHY; Surgeon: Heloise Ochoa, MD; Location: [facility]; Service: Cardiothoracic   UMBILICAL HERNIA REPAIR N/A 02/13/2014   Procedure: LAPAROSCOPIC UMBILICAL HERNIA REPAIR; Surgeon: Merlinda Frederick, MD; Location: [facility]; Service: General Surgery   Family History  Adopted: Yes  Problem Relation Age of Onset   Varicose Veins Daughter    Obesity Daughter    Hyperlipidemia Daughter    Diabetes Daughter    COPD Daughter    Depression Daughter    Asthma Daughter    Arthritis Son    Diabetes Son    Hypertension Son    Social History   Tobacco Use   Smoking status: Former    Current packs/day: 0.00    Average packs/day: 2.0 packs/day for 54.0 years (108.0 ttl pk-yrs)    Types: Cigarettes    Start date: 82    Quit date: 2010    Years since quitting: 15.1   Smokeless tobacco: Former    Types: Chew    Quit date: 05/16/2004  Substance Use Topics   Alcohol use: Yes    Comment: rare  beer   Pertinent Clinical Results:  LABS:  Lab Results  Component Value Date   WBC 7.8 05/31/2023   HGB 10.5 (L) 05/31/2023   HCT 32.0 (L) 05/31/2023   MCV 96.4 05/31/2023   PLT 158 05/31/2023   Lab Results  Component Value Date   NA 140 05/31/2023   CL 106 05/31/2023   K 4.8 05/31/2023   CO2 22 05/31/2023   BUN 55 (H) 05/31/2023   CREATININE 4.24 (H) 05/31/2023   GFRNONAA 14 (L) 05/31/2023   CALCIUM 8.6 (L) 05/31/2023   PHOS 4.3 07/10/2016   ALBUMIN 3.1 (L) 05/31/2023   GLUCOSE 77 05/31/2023    ECG: Date: 06/15/2023  Time ECG obtained: 1420 PM Rate: 75 bpm Rhythm: normal sinus Axis (leads I and aVF): normal Intervals: PR 174 ms. QRS 94 ms. QTc 476 ms. ST segment and T wave changes: Inferolateral ST and T wave abnormalities  Evidence of a possible, age undetermined, prior infarct:  No Comparison: Similar to previous tracing obtained on 04/22/2023   IMAGING /  PROCEDURES: TRANSESOPHAGEAL ECHOCARDIOGRAM performed on 04/26/2023 Left ventricular ejection fraction, by estimation, is 40 to 45%. The left ventricle has mildly decreased function. The left ventricle demonstrates global hypokinesis. The left ventricular internal cavity size was mildly dilated. There is mild left ventricular hypertrophy.  Right ventricular systolic function is mildly reduced. The right ventricular size is mildly enlarged. Mildly increased right ventricular wall thickness.  Smoke present. Left atrial size was moderately dilated. No left atrial/left atrial appendage thrombus was detected.  Right atrial size was moderately dilated.  The mitral valve is normal in structure. Mild to moderate mitral valve regurgitation.  Tricuspid valve regurgitation is mild to moderate.  The aortic valve has been repaired/replaced. Aortic valve regurgitation is mild. Aortic valve sclerosis/calcification is present, without any evidence of aortic stenosis. There is a valve present in the aortic position. Procedure Date: 5 years ago.   CT ABDOMEN PELVIS WO CONTRAST performed on 02/04/2023 No acute findings in the abdomen or pelvis. Left colonic diverticulosis. Left upper pole nephrolithiasis.  No hydronephrosis. Coronary artery disease Aortic atherosclerosis.  MYOCARDIAL PERFUSION IMAGING STUDY (LEXISCAN) performed on 08/29/2022 MAX PRED HR: 142              85%: 121                                75%: 107 RESTING BP:  130/70            RESTING HR:  72       PEAK BP: 130/60       PEAK HR: 78   EXERCISE DURATION: 4 min injection REASON FOR TEST TERMINATION: Protocol end SYMPTOMS: None EKG RESULTS: NSR. 71/min. No significant ST changes with persantine. IMAGE QUALITY: Good PERFUSION/WALL MOTION FINDINGS: EF = 53%. Large severe fixed with partial reversibility basal, mid and apical inferior wall defects, mild inferior wall hypokinesis. IMPRESSION: Ischemia in the RCA territory with borderline LVEF  dysfunction, advise CCTA.    RIGHT HEART CATHETERIZATION AND CORONARY ANGIOGRAPHY performed on 07/13/2016 Elevated right and left-sided filling pressures Mean RA pressure 14 mmHg Mean PA pressure 45 mmHg PWP 27 mmHg PVR 3.8 Wood units CI 2.3 L/min/m  Impression and Plan:  [name] [name] has been referred for pre-anesthesia review and clearance prior to him undergoing the planned anesthetic and procedural courses. Available labs, pertinent testing, and imaging results were personally reviewed by me in  preparation for upcoming operative/procedural course. [facility] medical record has been updated following extensive record review and patient interview with PAT staff.   This patient has been appropriately cleared by cardiology with an overall LOW risk of experiencing significant perioperative cardiovascular complications. Based on clinical review performed today (06/25/23), barring any significant acute changes in the patient's overall condition, it is anticipated that he will be able to proceed with the planned surgical intervention. Any acute changes in clinical condition may necessitate his procedure being postponed and/or cancelled. Patient will meet with anesthesia team (MD and/or CRNA) on the day of his procedure for preoperative evaluation/assessment. Questions regarding anesthetic course will be fielded at that time.   Pre-surgical instructions were reviewed with the patient during his PAT appointment, and questions were fielded to satisfaction by PAT clinical staff. He has been instructed on which medications that he will need to hold prior to surgery, as well as the ones that have been deemed safe/appropriate to take on the day of his procedure. As part of the general education provided by PAT, patient made aware both verbally and in writing, that he would need to abstain from the use of any illegal substances during his perioperative course. He was advised that failure to follow  the provided instructions could necessitate case cancellation or result in serious perioperative complications up to and including death. Patient encouraged to contact PAT and/or his surgeon's office to discuss any questions or concerns that may arise prior to surgery; verbalized understanding.   Quentin Mulling, MSN, APRN, FNP-C, CEN [facility]  Perioperative Services Nurse Practitioner Phone: [phone] Fax: [phone] 06/25/23 1:56 PM  NOTE: This note has been prepared using Dragon dictation software. Despite my best ability to proofread, there is always the potential that unintentional transcriptional errors may still occur from this process.

## 2023-06-27 ENCOUNTER — Encounter
Admission: RE | Disposition: A | Discharge status: Home / Self Care | Payer: Self-pay | Source: Home / Self Care | Attending: Vascular Surgery

## 2023-06-27 ENCOUNTER — Other Ambulatory Visit: Payer: Self-pay

## 2023-06-27 ENCOUNTER — Encounter: Payer: Self-pay | Admitting: Vascular Surgery

## 2023-06-27 ENCOUNTER — Ambulatory Visit: Payer: Self-pay | Admitting: Urgent Care

## 2023-06-27 ENCOUNTER — Ambulatory Visit
Admission: RE | Admit: 2023-06-27 | Discharge: 2023-06-27 | Disposition: A | Discharge status: Home / Self Care | Payer: HMO | Attending: Vascular Surgery | Admitting: Vascular Surgery

## 2023-06-27 DIAGNOSIS — J449 Chronic obstructive pulmonary disease, unspecified: Secondary | ICD-10-CM | POA: Diagnosis not present

## 2023-06-27 DIAGNOSIS — K219 Gastro-esophageal reflux disease without esophagitis: Secondary | ICD-10-CM | POA: Diagnosis not present

## 2023-06-27 DIAGNOSIS — I130 Hypertensive heart and chronic kidney disease with heart failure and stage 1 through stage 4 chronic kidney disease, or unspecified chronic kidney disease: Secondary | ICD-10-CM | POA: Diagnosis not present

## 2023-06-27 DIAGNOSIS — Z87891 Personal history of nicotine dependence: Secondary | ICD-10-CM | POA: Diagnosis not present

## 2023-06-27 DIAGNOSIS — I272 Pulmonary hypertension, unspecified: Secondary | ICD-10-CM | POA: Insufficient documentation

## 2023-06-27 DIAGNOSIS — K449 Diaphragmatic hernia without obstruction or gangrene: Secondary | ICD-10-CM | POA: Diagnosis not present

## 2023-06-27 DIAGNOSIS — I5043 Acute on chronic combined systolic (congestive) and diastolic (congestive) heart failure: Secondary | ICD-10-CM | POA: Diagnosis not present

## 2023-06-27 DIAGNOSIS — G4733 Obstructive sleep apnea (adult) (pediatric): Secondary | ICD-10-CM | POA: Insufficient documentation

## 2023-06-27 DIAGNOSIS — J9611 Chronic respiratory failure with hypoxia: Secondary | ICD-10-CM | POA: Diagnosis not present

## 2023-06-27 DIAGNOSIS — I5022 Chronic systolic (congestive) heart failure: Secondary | ICD-10-CM | POA: Diagnosis not present

## 2023-06-27 DIAGNOSIS — D631 Anemia in chronic kidney disease: Secondary | ICD-10-CM | POA: Diagnosis not present

## 2023-06-27 DIAGNOSIS — Z9981 Dependence on supplemental oxygen: Secondary | ICD-10-CM | POA: Insufficient documentation

## 2023-06-27 DIAGNOSIS — I25118 Atherosclerotic heart disease of native coronary artery with other forms of angina pectoris: Secondary | ICD-10-CM | POA: Insufficient documentation

## 2023-06-27 DIAGNOSIS — E785 Hyperlipidemia, unspecified: Secondary | ICD-10-CM | POA: Diagnosis not present

## 2023-06-27 DIAGNOSIS — E039 Hypothyroidism, unspecified: Secondary | ICD-10-CM | POA: Insufficient documentation

## 2023-06-27 DIAGNOSIS — N186 End stage renal disease: Secondary | ICD-10-CM | POA: Insufficient documentation

## 2023-06-27 DIAGNOSIS — N2581 Secondary hyperparathyroidism of renal origin: Secondary | ICD-10-CM | POA: Insufficient documentation

## 2023-06-27 DIAGNOSIS — Z01818 Encounter for other preprocedural examination: Secondary | ICD-10-CM

## 2023-06-27 DIAGNOSIS — Z952 Presence of prosthetic heart valve: Secondary | ICD-10-CM | POA: Insufficient documentation

## 2023-06-27 DIAGNOSIS — Z79899 Other long term (current) drug therapy: Secondary | ICD-10-CM | POA: Insufficient documentation

## 2023-06-27 DIAGNOSIS — I132 Hypertensive heart and chronic kidney disease with heart failure and with stage 5 chronic kidney disease, or end stage renal disease: Secondary | ICD-10-CM | POA: Diagnosis not present

## 2023-06-27 DIAGNOSIS — E1122 Type 2 diabetes mellitus with diabetic chronic kidney disease: Secondary | ICD-10-CM | POA: Insufficient documentation

## 2023-06-27 HISTORY — DX: Cataract extraction status, right eye: Z98.41

## 2023-06-27 HISTORY — DX: Other specified health status: Z78.9

## 2023-06-27 HISTORY — DX: Type 2 diabetes mellitus without complications: E11.9

## 2023-06-27 HISTORY — DX: End stage renal disease: N18.6

## 2023-06-27 HISTORY — DX: Atherosclerosis of aorta: I70.0

## 2023-06-27 HISTORY — DX: Pulmonary hypertension, unspecified: I27.20

## 2023-06-27 HISTORY — DX: Diverticulosis of intestine, part unspecified, without perforation or abscess without bleeding: K57.90

## 2023-06-27 HISTORY — PX: AV FISTULA PLACEMENT: SHX1204

## 2023-06-27 HISTORY — DX: Cataract extraction status, right eye: Z98.42

## 2023-06-27 LAB — GLUCOSE, CAPILLARY
Glucose-Capillary: 98 mg/dL (ref 70–99)
Glucose-Capillary: 113 mg/dL — ABNORMAL HIGH (ref 70–99)

## 2023-06-27 LAB — ABO/RH: ABO/RH(D): A POS

## 2023-06-27 SURGERY — ARTERIOVENOUS (AV) FISTULA CREATION
Anesthesia: General | Laterality: Right

## 2023-06-27 MED ORDER — SUGAMMADEX SODIUM 200 MG/2ML IV SOLN
INTRAVENOUS | Status: DC | PRN
  Administered 2023-06-27: 400 mg via INTRAVENOUS

## 2023-06-27 MED ORDER — ONDANSETRON HCL 4 MG/2ML IJ SOLN
INTRAMUSCULAR | Status: DC | PRN
  Administered 2023-06-27: 4 mg via INTRAVENOUS

## 2023-06-27 MED ORDER — ORAL CARE MOUTH RINSE: 15.0000 mL | Freq: Once | OROMUCOSAL | Status: AC

## 2023-06-27 MED ORDER — ONDANSETRON HCL 4 MG/2ML IJ SOLN
INTRAMUSCULAR | Status: AC
  Filled 2023-06-27: qty 2

## 2023-06-27 MED ORDER — CHLORHEXIDINE GLUCONATE 0.12 % MT SOLN
OROMUCOSAL | Status: AC
  Filled 2023-06-27: qty 15

## 2023-06-27 MED ORDER — FENTANYL CITRATE (PF) 100 MCG/2ML IJ SOLN
INTRAMUSCULAR | Status: DC | PRN
Start: 2023-06-27 — End: 2023-06-27
  Administered 2023-06-27 (×2): 25 ug via INTRAVENOUS
  Administered 2023-06-27: 50 ug via INTRAVENOUS

## 2023-06-27 MED ORDER — OXYCODONE HCL 5 MG PO TABS
5.0000 mg | ORAL_TABLET | Freq: Once | ORAL | Status: AC
  Administered 2023-06-27: 5 mg via ORAL

## 2023-06-27 MED ORDER — ALBUTEROL SULFATE HFA 108 (90 BASE) MCG/ACT IN AERS
INHALATION_SPRAY | RESPIRATORY_TRACT | Status: DC | PRN
  Administered 2023-06-27: 2 via RESPIRATORY_TRACT

## 2023-06-27 MED ORDER — CEFAZOLIN SODIUM-DEXTROSE 2-4 GM/100ML-% IV SOLN
2.0000 g | INTRAVENOUS | Status: AC
  Administered 2023-06-27: 2 g via INTRAVENOUS

## 2023-06-27 MED ORDER — ROCURONIUM BROMIDE 10 MG/ML (PF) SYRINGE
PREFILLED_SYRINGE | INTRAVENOUS | Status: AC
  Filled 2023-06-27: qty 10

## 2023-06-27 MED ORDER — CHLORHEXIDINE GLUCONATE CLOTH 2 % EX PADS
6.0000 | MEDICATED_PAD | Freq: Once | CUTANEOUS | Status: AC
Start: 2023-06-27 — End: 2023-06-27
  Administered 2023-06-27: 6 via TOPICAL

## 2023-06-27 MED ORDER — LIDOCAINE HCL (CARDIAC) PF 100 MG/5ML IV SOSY
PREFILLED_SYRINGE | INTRAVENOUS | Status: DC | PRN
  Administered 2023-06-27: 80 mg via INTRAVENOUS

## 2023-06-27 MED ORDER — ACETAMINOPHEN 500 MG PO TABS: 1000.0000 mg | ORAL_TABLET | Freq: Once | ORAL | Status: DC

## 2023-06-27 MED ORDER — OXYCODONE HCL 5 MG PO TABS
ORAL_TABLET | ORAL | Status: AC
  Filled 2023-06-27: qty 1

## 2023-06-27 MED ORDER — HYDROCODONE-ACETAMINOPHEN 5-325 MG PO TABS: 1.0000 | ORAL_TABLET | Freq: Four times a day (QID) | ORAL | 0 refills | Status: DC | PRN

## 2023-06-27 MED ORDER — PROPOFOL 10 MG/ML IV BOLUS
INTRAVENOUS | Status: DC | PRN
  Administered 2023-06-27: 100 mg via INTRAVENOUS

## 2023-06-27 MED ORDER — SODIUM CHLORIDE 0.9 % IV SOLN
INTRAVENOUS | Status: DC | PRN
  Administered 2023-06-27: 501 mL

## 2023-06-27 MED ORDER — HEMOSTATIC AGENTS (NO CHARGE) OPTIME
TOPICAL | Status: DC | PRN
Start: 2023-06-27 — End: 2023-06-27
  Administered 2023-06-27: 1 via TOPICAL

## 2023-06-27 MED ORDER — EPHEDRINE SULFATE-NACL 50-0.9 MG/10ML-% IV SOSY
PREFILLED_SYRINGE | INTRAVENOUS | Status: DC | PRN
  Administered 2023-06-27: 5 mg via INTRAVENOUS

## 2023-06-27 MED ORDER — PHENYLEPHRINE 80 MCG/ML (10ML) SYRINGE FOR IV PUSH (FOR BLOOD PRESSURE SUPPORT)
PREFILLED_SYRINGE | INTRAVENOUS | Status: DC | PRN
Start: 2023-06-27 — End: 2023-06-27
  Administered 2023-06-27: 80 ug via INTRAVENOUS

## 2023-06-27 MED ORDER — SODIUM CHLORIDE 0.9 % IV SOLN: INTRAVENOUS | Status: DC

## 2023-06-27 MED ORDER — ROCURONIUM BROMIDE 100 MG/10ML IV SOLN
INTRAVENOUS | Status: DC | PRN
  Administered 2023-06-27: 40 mg via INTRAVENOUS
  Administered 2023-06-27: 20 mg via INTRAVENOUS

## 2023-06-27 MED ORDER — CEFAZOLIN SODIUM-DEXTROSE 2-4 GM/100ML-% IV SOLN
INTRAVENOUS | Status: AC
  Filled 2023-06-27: qty 100

## 2023-06-27 MED ORDER — STERILE WATER FOR IRRIGATION IR SOLN
Status: DC | PRN
Start: 2023-06-27 — End: 2023-06-27
  Administered 2023-06-27: 500 mL

## 2023-06-27 MED ORDER — ALBUTEROL SULFATE HFA 108 (90 BASE) MCG/ACT IN AERS
INHALATION_SPRAY | RESPIRATORY_TRACT | Status: AC
  Filled 2023-06-27: qty 6.7

## 2023-06-27 MED ORDER — DEXAMETHASONE SODIUM PHOSPHATE 10 MG/ML IJ SOLN
INTRAMUSCULAR | Status: DC | PRN
  Administered 2023-06-27: 5 mg via INTRAVENOUS

## 2023-06-27 MED ORDER — PROPOFOL 10 MG/ML IV BOLUS
INTRAVENOUS | Status: AC
  Filled 2023-06-27: qty 20

## 2023-06-27 MED ORDER — CHLORHEXIDINE GLUCONATE CLOTH 2 % EX PADS
6.0000 | MEDICATED_PAD | Freq: Once | CUTANEOUS | Status: AC
  Administered 2023-06-27: 6 via TOPICAL

## 2023-06-27 MED ORDER — DEXAMETHASONE SODIUM PHOSPHATE 10 MG/ML IJ SOLN
INTRAMUSCULAR | Status: AC
  Filled 2023-06-27: qty 1

## 2023-06-27 MED ORDER — CHLORHEXIDINE GLUCONATE 0.12 % MT SOLN
15.0000 mL | Freq: Once | OROMUCOSAL | Status: AC
  Administered 2023-06-27: 15 mL via OROMUCOSAL

## 2023-06-27 MED ORDER — EPHEDRINE 5 MG/ML INJ
INTRAVENOUS | Status: AC
  Filled 2023-06-27: qty 5

## 2023-06-27 MED ORDER — HEPARIN SODIUM (PORCINE) 5000 UNIT/ML IJ SOLN
INTRAMUSCULAR | Status: AC
  Filled 2023-06-27: qty 1

## 2023-06-27 SURGICAL SUPPLY — 39 items
BAG DECANTER FOR FLEXI CONT (MISCELLANEOUS) ×1 IMPLANT
BLADE SURG SZ11 CARB STEEL (BLADE) ×1 IMPLANT
BRUSH SCRUB EZ 4% CHG (MISCELLANEOUS) ×1 IMPLANT
CHLORAPREP W/TINT 26 (MISCELLANEOUS) ×1 IMPLANT
CLAMP SUTURE YELLOW 5 PAIRS (MISCELLANEOUS) ×1 IMPLANT
DERMABOND ADVANCED .7 DNX12 (GAUZE/BANDAGES/DRESSINGS) ×1 IMPLANT
DRESSING SURGICEL FIBRLLR 1X2 (HEMOSTASIS) ×1 IMPLANT
DRSG SURGICEL FIBRILLAR 1X2 (HEMOSTASIS) ×1 IMPLANT
ELECT CAUTERY BLADE 6.4 (BLADE) ×1 IMPLANT
ELECT REM PT RETURN 9FT ADLT (ELECTROSURGICAL) ×1 IMPLANT
ELECTRODE REM PT RTRN 9FT ADLT (ELECTROSURGICAL) ×1 IMPLANT
GLOVE BIO SURGEON STRL SZ7 (GLOVE) ×1 IMPLANT
GLOVE SURG SYN 8.0 (GLOVE) ×1 IMPLANT
GLOVE SURG SYN 8.0 PF PI (GLOVE) ×1 IMPLANT
GOWN STRL REUS W/ TWL LRG LVL3 (GOWN DISPOSABLE) ×2 IMPLANT
GOWN STRL REUS W/ TWL XL LVL3 (GOWN DISPOSABLE) ×1 IMPLANT
IV NS 500ML BAXH (IV SOLUTION) ×1 IMPLANT
KIT TURNOVER KIT A (KITS) ×1 IMPLANT
LABEL OR SOLS (LABEL) ×1 IMPLANT
LOOP VESSEL MAXI 1X406 RED (MISCELLANEOUS) ×1 IMPLANT
LOOP VESSEL MINI 0.8X406 BLUE (MISCELLANEOUS) ×2 IMPLANT
MANIFOLD NEPTUNE II (INSTRUMENTS) ×1 IMPLANT
NDL FILTER BLUNT 18X1 1/2 (NEEDLE) ×1 IMPLANT
NEEDLE FILTER BLUNT 18X1 1/2 (NEEDLE) ×1 IMPLANT
PACK EXTREMITY ARMC (MISCELLANEOUS) ×1 IMPLANT
PAD PREP OB/GYN DISP 24X41 (PERSONAL CARE ITEMS) ×1 IMPLANT
STOCKINETTE 48X4 2 PLY STRL (GAUZE/BANDAGES/DRESSINGS) ×1 IMPLANT
STOCKINETTE STRL 4IN 9604848 (GAUZE/BANDAGES/DRESSINGS) ×1 IMPLANT
SUT MNCRL+ 5-0 UNDYED PC-3 (SUTURE) ×1 IMPLANT
SUT PROLENE 6 0 BV (SUTURE) ×4 IMPLANT
SUT SILK 2-0 18XBRD TIE 12 (SUTURE) ×1 IMPLANT
SUT SILK 3-0 18XBRD TIE 12 (SUTURE) ×1 IMPLANT
SUT SILK 4-0 18XBRD TIE 12 (SUTURE) ×1 IMPLANT
SUT VIC AB 3-0 SH 27X BRD (SUTURE) ×1 IMPLANT
SYR 20ML LL LF (SYRINGE) ×1 IMPLANT
SYR 3ML LL SCALE MARK (SYRINGE) ×1 IMPLANT
TAG SUTURE CLAMP YLW 5PR (MISCELLANEOUS) ×1 IMPLANT
TRAP FLUID SMOKE EVACUATOR (MISCELLANEOUS) ×1 IMPLANT
WATER STERILE IRR 500ML POUR (IV SOLUTION) ×1 IMPLANT

## 2023-06-27 NOTE — Anesthesia Preprocedure Evaluation (Addendum)
 Anesthesia Evaluation  Patient identified by MRN, date of birth, ID band Patient awake    Reviewed: Allergy & Precautions, H&P , NPO status , Patient's Chart, lab work & pertinent test results, reviewed documented beta blocker date and time   Airway Mallampati: IV  TM Distance: <3 FB Neck ROM: Full    Dental  (+) Partial Upper, Chipped, Missing,    Pulmonary sleep apnea and Continuous Positive Airway Pressure Ventilation , pneumonia, COPD,  oxygen dependent, former smoker sleep apnea , pneumonia, COPD,  oxygen dependent, former smoker  reports that he quit smoking about 14 years ago. His smoking use included cigarettes. He has a 108.00 pack-year smoking history. He quit smokeless tobacco use about 18 years ago.  His smokeless tobacco use included chew.   Patient wears oxygen at night continuous and as needed during the day   [place] in place   + decreased breath sounds      Cardiovascular Exercise Tolerance: Poor METS: < 3 Mets hypertension, Pt. on home beta blockers and Pt. 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 In 2022, the  Left ventricular ejection fraction, by estimation, is 50 to 55%. (Has dropped to 38% in 2024)    The left ventricle has low normal function. The left ventricle demonstrates  global hypokinesis. There is mild left ventricular hypertrophy. Left  ventricular diastolic parameters are  consistent with Grade I diastolic dysfunction (impaired relaxation).   2. Right ventricular systolic function is normal. The right ventricular  size is normal.   3. Left atrial size was mildly dilated.   4. Right atrial size was mildly dilated.   5. The mitral valve was not well visualized. Trivial mitral valve  regurgitation. Moderate  mitral annular calcification.   6. The aortic valve was not well visualized. Aortic valve regurgitation  is trivial.    09-05-22            Coronary artery disease - Primary                          Stress test 08/2022 Ischemia in the RCA territory with borderline LVEF dysfunction, advise CCTA. Kidney function decreased, unable to do CCTA. Patient denies chest pain. Will treat medically.                              echo 08/12/22 EF 38%. Patient complaining of worsening shortness of breath. Unable to change losartan to [facility] due to decreased kidney function.    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," did not see that in epic         Neuro/Psych  Headaches PSYCHIATRIC DISORDERS Anxiety Depression    negative neurological ROS     GI/Hepatic Neg liver ROS, hiatal hernia,GERD  ,,  Endo/Other  diabetes, Type 2Hypothyroidism    Renal/GU Renal diseaseCKD (chronic kidney disease), stage V  negative genitourinary   Musculoskeletal negative musculoskeletal ROS (+) Arthritis ,    Abdominal  (+) + obese  Peds negative pediatric ROS (+)  Hematology  (+) Blood dyscrasia, anemia   Anesthesia Other Findings T2DM (type 2 diabetes mellitus) (HCC) Hypertension Kidney stones  COPD  Basal cell carcinoma  Squamous cell carcinoma of skin Pneumonia  Heart murmur  OSA on CPAP DOE (dyspnea on exertion) History of hiatal hernia  Migraines Arthritis  Hyperkalemia CAD (coronary artery disease) Aortic stenosis, 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)  CKD (chronic kidney disease), stage IV  HLD (hyperlipidemia)  HFrEF (heart failure with reduced ejection fraction) (HCC) Supplemental oxygen dependent GERD (gastroesophageal reflux disease) BCC (basal cell carcinoma of skin) Squamous cell carcinoma of skin Respiratory failure (HCC)  Acute renal failure (ARF) (HCC) CAP (community acquired pneumonia) AKI (acute kidney injury) (HCC) DM  (diabetes mellitus), type 2 (HCC) Accidental cut, puncture, perforation, or hemorrhage during heart catheterization Acute postoperative pain  ANA positive Benign hypertensive kidney disease with chronic kidney disease  Complete tear of right rotator cuff Complex tear of medial meniscus of left knee as current injury  Personal history of other malignant neoplasm of skin Recurrent nephrolithiasis  Rotator cuff tendinitis, right S/P TAVR (transcatheter aortic valve replacement)  Complex tear of lateral meniscus of left knee as current injury Grade I diastolic dysfunction  Left atrial dilation Right atrial dilation  Gout Hypothyroidism  Supplemental oxygen dependent    Reproductive/Obstetrics negative OB ROS                             Anesthesia Physical Anesthesia Plan  ASA: 4  Anesthesia Plan: General   Post-op Pain Management: Regional block*   Induction: Intravenous  PONV Risk Score and Plan: 2 and Treatment may vary due to age or medical condition, Dexamethasone and Ondansetron  Airway Management Planned: Oral ETT  Additional Equipment:   Intra-op Plan:   Post-operative Plan: Extubation in OR  Informed Consent: I have reviewed the patients History and Physical, chart, labs and discussed the procedure including the risks, benefits and alternatives for the proposed anesthesia with the patient or authorized representative who has indicated his/her understanding and acceptance.     Dental Advisory Given  Plan Discussed with: Anesthesiologist, CRNA and Surgeon  Anesthesia Plan Comments:         Anesthesia Quick Evaluation

## 2023-06-27 NOTE — Anesthesia Postprocedure Evaluation (Signed)
 Anesthesia Post Note  Patient: [name] [name]  Procedure(s) Performed: ARTERIOVENOUS (AV) FISTULA CREATION (BRACHIALCEPHALIC) (Right)  Patient location during evaluation: PACU Anesthesia Type: General Level of consciousness: awake and alert Pain management: pain level controlled Vital Signs Assessment: post-procedure vital signs reviewed and stable Respiratory status: spontaneous breathing, nonlabored ventilation and respiratory function stable Cardiovascular status: blood pressure returned to baseline and stable Postop Assessment: no apparent nausea or vomiting Anesthetic complications: no   No notable events documented.   Last Vitals:  Vitals:   06/27/23 1330 06/27/23 1343  BP: (!) 134/57 102/61  Pulse: 75 93  Resp: 20 20  Temp:  (!) 36.1 C  SpO2: 95% 94%    Last Pain:  Vitals:   06/27/23 1343  TempSrc: Temporal  PainSc: 0-No pain                 Foye Deer

## 2023-06-27 NOTE — Anesthesia Procedure Notes (Signed)
 Procedure Name: Intubation Date/Time: 06/27/2023 11:02 AM  Performed by: Pricilla Handler, RNPre-anesthesia Checklist: Patient identified, Patient being monitored, Timeout performed, Emergency Drugs available and Suction available Patient Re-evaluated:Patient Re-evaluated prior to induction Oxygen Delivery Method: Circle system utilized Preoxygenation: Pre-oxygenation with 100% oxygen Induction Type: IV induction Ventilation: Mask ventilation without difficulty Laryngoscope Size: Mac and 4 Grade View: Grade I Tube type: Oral Tube size: 7.5 mm Number of attempts: 1 Airway Equipment and Method: Stylet Placement Confirmation: ETT inserted through vocal cords under direct vision, positive ETCO2 and breath sounds checked- equal and bilateral Secured at: 22 cm Tube secured with: Tape Dental Injury: Teeth and Oropharynx as per pre-operative assessment  Comments: Preformed by SRNA; CRNA and MDA present for procedure

## 2023-06-27 NOTE — Progress Notes (Signed)
Bruit and thrill present 

## 2023-06-27 NOTE — Transfer of Care (Signed)
 Immediate Anesthesia Transfer of Care Note  Patient: [name] [name]  Procedure(s) Performed: ARTERIOVENOUS (AV) FISTULA CREATION (BRACHIALCEPHALIC) (Right)  Patient Location: PACU  Anesthesia Type:General  Level of Consciousness: drowsy and patient cooperative  Airway & Oxygen Therapy: Patient Spontanous Breathing and Patient connected to face mask oxygen  Post-op Assessment: Report given to RN and Post -op Vital signs reviewed and stable  Post vital signs: Reviewed and stable  Last Vitals:  Vitals Value Taken Time  BP 146/71 06/27/23 1247  Temp 36.1 C 06/27/23 1247  Pulse 81 06/27/23 1251  Resp 19 06/27/23 1251  SpO2 95 % 06/27/23 1251  Vitals shown include unfiled device data.  Last Pain:  Vitals:   06/27/23 1247  TempSrc:   PainSc: 0-No pain      Patients Stated Pain Goal: 0 (06/27/23 1014)  Complications: No notable events documented.

## 2023-06-27 NOTE — Op Note (Signed)
     OPERATIVE NOTE   PROCEDURE: right brachial cephalic arteriovenous fistula placement  PRE-OPERATIVE DIAGNOSIS: End Stage Renal Disease  POST-OPERATIVE DIAGNOSIS: End Stage Renal Disease  SURGEON: Levora Dredge  ASSISTANT(S): none  ANESTHESIA: general  ESTIMATED BLOOD LOSS: <50 cc  FINDING(S): 4.0 mm cephalic vein  SPECIMEN(S):  none  INDICATIONS:   [name] [name] is a 78 y.o. male who presents with end stage renal disease.  The patient is scheduled for right brachiocephalic arteriovenous fistula placement.  The patient is aware the risks include but are not limited to: bleeding, infection, steal syndrome, nerve damage, ischemic monomelic neuropathy, failure to mature, and need for additional procedures.  The patient is aware of the risks of the procedure and elects to proceed forward.  DESCRIPTION: After full informed written consent was obtained from the patient, the patient was brought back to the operating room and placed supine upon the operating table.  Prior to induction, the patient received IV antibiotics.   After obtaining adequate anesthesia, the patient was then prepped and draped in the standard fashion for a right arm access procedure.   A curvilinear incision was then created midway between the radial impulse and the cephalic vein. The cephalic vein was then identified and dissected circumferentially. It was marked with a surgical marker.    Attention was then turned to the brachial artery which was exposed through the same incision and looped proximally and distally. Side branches were controlled with 4-0 silk ties.  The distal segment of the vein was ligated with a  2-0 silk, and the vein was transected.  The proximal segment was interrogated with serial dilators.  The vein accepted up to a 4 mm dilator without any difficulty. Heparinized saline was infused into the vein and clamped it with a small bulldog.  At this point, I reset my exposure of the  brachial artery and controlled the artery with vessel loops proximally and distally.  An arteriotomy was then made with a #11 blade, and extended with a Potts scissor.  Heparinized saline was injected proximal and distal into the radial artery.  The vein was then approximated to the artery while the artery was in its native bed and subsequently the vein was beveled using Potts scissors. The vein was then sewn to the artery in an end-to-side configuration with a running stitch of 6-0 Prolene.  Prior to completing this anastomosis Flushing maneuvers were performed and the artery was allowed to forward and back bleed.  There was no evidence of clot from any vessels.  I completed the anastomosis in the usual fashion and then released all vessel loops and clamps.    There was good  thrill in the venous outflow, and there was 1+ palpable radial pulse.  At this point, I irrigated out the surgical wound.  There was no further active bleeding.  The subcutaneous tissue was reapproximated with a running stitch of 3-0 Vicryl.  The skin was then reapproximated with a running subcuticular stitch of 4-0 Vicryl.  The skin was then cleaned, dried, and reinforced with Dermabond.    The patient tolerated this procedure well.   COMPLICATIONS: None  CONDITION: [name]  Office: [phone]   06/27/2023, 12:46 PM

## 2023-06-27 NOTE — Interval H&P Note (Signed)
 History and Physical Interval Note:  06/27/2023 10:23 AM  [name] [name]  has presented today for surgery, with the diagnosis of ESRD.  The various methods of treatment have been discussed with the patient and family. After consideration of risks, benefits and other options for treatment, the patient has consented to  Procedure(s): ARTERIOVENOUS (AV) FISTULA CREATION (BRACHIALCEPHALIC) (Right) as a surgical intervention.  The patient's history has been reviewed, patient examined, no change in status, stable for surgery.  I have reviewed the patient's chart and labs.  Questions were answered to the patient's satisfaction.     Levora Dredge

## 2023-06-28 ENCOUNTER — Telehealth (INDEPENDENT_AMBULATORY_CARE_PROVIDER_SITE_OTHER): Payer: Self-pay

## 2023-06-28 ENCOUNTER — Encounter: Payer: Self-pay | Admitting: Vascular Surgery

## 2023-06-28 NOTE — Telephone Encounter (Signed)
 Patient spouse reach out to the office to see if her husband can restart  medications today. Patient spouse was notified that her husband can resume with medications.

## 2023-06-29 ENCOUNTER — Ambulatory Visit: Payer: Self-pay

## 2023-06-29 ENCOUNTER — Telehealth (INDEPENDENT_AMBULATORY_CARE_PROVIDER_SITE_OTHER): Payer: Self-pay

## 2023-06-29 NOTE — Telephone Encounter (Signed)
 Patient spouse left a message stating that her husband is having some clear drainage from surgical site. Also he is having some swelling from the arm to the fingers. Patient spouse was advise to have her husband elevate the right arm and place dry dressing over the site. Patient spouse verbalized understanding.

## 2023-06-29 NOTE — Patient Outreach (Signed)
 Care Coordination   06/29/2023 Name: Elihue Ebert MRN: [id number] DOB: Jun 20, 1945   Care Coordination Outreach Attempts:  Successful telephone outreach with patient.  Patient states she forgot about today's telephone appointment with RN case manager and request to be rescheduled.   Follow Up Plan:  Additional outreach attempts will be made to offer the patient complex care management information and services.   Encounter Outcome:  Patient Request to Call Back   Care Coordination Interventions:  No, not indicated    George Ina RN, BSN, CCM [place]  [facility], Population Health Case Manager Phone: [phone]

## 2023-07-04 ENCOUNTER — Ambulatory Visit: Payer: Self-pay

## 2023-07-04 NOTE — Patient Outreach (Signed)
 Care Coordination   07/04/2023 Name: [name] [name] MRN: [id number] DOB: 1945/10/20   Care Coordination Outreach Attempts:  Successful telephone outreach to patients spouse/ dpr [name].  Spouse states she is not able to keep telephone appointment with  RN case manager today and request to reschedule.   Follow Up Plan:  Additional outreach attempts will be made to offer the patient complex care management information and services.   Encounter Outcome:  Patient Request to Call Back   Care Coordination Interventions:  No, not indicated    George Ina RN, BSN, CCM [place]  [facility], Population Health Case Manager Phone: [phone]

## 2023-07-12 DIAGNOSIS — N184 Chronic kidney disease, stage 4 (severe): Secondary | ICD-10-CM | POA: Diagnosis not present

## 2023-07-12 DIAGNOSIS — I129 Hypertensive chronic kidney disease with stage 1 through stage 4 chronic kidney disease, or unspecified chronic kidney disease: Secondary | ICD-10-CM | POA: Diagnosis not present

## 2023-07-12 DIAGNOSIS — R809 Proteinuria, unspecified: Secondary | ICD-10-CM | POA: Diagnosis not present

## 2023-07-12 DIAGNOSIS — E1122 Type 2 diabetes mellitus with diabetic chronic kidney disease: Secondary | ICD-10-CM | POA: Diagnosis not present

## 2023-07-12 DIAGNOSIS — N050 Unspecified nephritic syndrome with minor glomerular abnormality: Secondary | ICD-10-CM | POA: Diagnosis not present

## 2023-07-12 DIAGNOSIS — D631 Anemia in chronic kidney disease: Secondary | ICD-10-CM | POA: Diagnosis not present

## 2023-07-12 DIAGNOSIS — M150 Primary generalized (osteo)arthritis: Secondary | ICD-10-CM | POA: Diagnosis not present

## 2023-07-12 DIAGNOSIS — Z79899 Other long term (current) drug therapy: Secondary | ICD-10-CM | POA: Diagnosis not present

## 2023-07-12 DIAGNOSIS — M1A09X0 Idiopathic chronic gout, multiple sites, without tophus (tophi): Secondary | ICD-10-CM | POA: Diagnosis not present

## 2023-07-12 DIAGNOSIS — N2581 Secondary hyperparathyroidism of renal origin: Secondary | ICD-10-CM | POA: Diagnosis not present

## 2023-07-13 DIAGNOSIS — H353221 Exudative age-related macular degeneration, left eye, with active choroidal neovascularization: Secondary | ICD-10-CM | POA: Diagnosis not present

## 2023-07-18 ENCOUNTER — Other Ambulatory Visit (INDEPENDENT_AMBULATORY_CARE_PROVIDER_SITE_OTHER): Payer: Self-pay | Admitting: Vascular Surgery

## 2023-07-18 DIAGNOSIS — N186 End stage renal disease: Secondary | ICD-10-CM

## 2023-07-18 NOTE — Progress Notes (Unsigned)
 Patient ID: [name] [name], male   DOB: April 20, 1946, 78 y.o.   MRN: [id number]  No chief complaint on file.   HPI [name] [name] is a 78 y.o. male.    PROCEDURE 06/27/2023: right brachial cephalic arteriovenous fistula placement.  Patient denies any pain at the surgical site.  He denies hand pain.  Duplex ultrasound of the AV fistula demonstrates a widely patent fistula with a flow volume of 1869 cc/min   Past Medical History:  Diagnosis Date   Accidental cut, puncture, perforation, or hemorrhage during heart catheterization 12/27/2012   Adopted    ANA positive 07/12/2015   Aortic atherosclerosis (HCC)    Aortic stenosis, severe    a.) s/p TAVR on 08/01/2016: 29 mm CoreValve Evolut placed   Arthritis    Bacteremia due to Streptococcus 04/24/2023   Basal cell carcinoma 01/04/2010   Right sup. post. helix. Excised 03/02/2010   BCC (basal cell carcinoma of skin) 11/24/2020   R neck below the ear, EDC   BPH (benign prostatic hyperplasia)    CAD (coronary artery disease) 12/26/2012   a.) LHC 12/26/2012: 50% mLAD, 80% mLCx, 100% pRCA --> [facility] at [facility] --> decided again revascularization due to insig LAD disease and the fact that RCA lesion was chronic   CAP (community acquired pneumonia) 07/26/2020   Chronic respiratory failure with hypoxia (HCC)    Claudication of both lower extremities (HCC)    Complete tear of right rotator cuff 11/15/2015   Complex tear of lateral meniscus of left knee as current injury 12/06/2020   Complex tear of medial meniscus of left knee as current injury 10/18/2020   COPD (chronic obstructive pulmonary disease) (HCC)    Diet-controlled type 2 diabetes mellitus (HCC)    Diverticulosis    Dyspnea    ESRD (end stage renal disease) (HCC)    GERD (gastroesophageal reflux disease)    Gout    Heart murmur    HFrEF (heart failure with reduced ejection fraction) (HCC)    a.) s/p TAVR on 08/01/2016: 29 mm CoreValve Evolut placed   History  of bilateral cataract extraction    History of hiatal hernia    HLD (hyperlipidemia)    Hypertension    Hypothyroidism    IDA (iron deficiency anemia)    Migraines    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   a.) TTE 04/24/2023: moderate (MPG 7.5 mmHG)   OSA on CPAP    Pneumonia 07/2020   Pulmonary hypertension (HCC)    a.) TTE 08/17/2022: "mild pHTN" per echo report; b.) TTE 04/24/2023: RVSP 39.1   Recurrent nephrolithiasis 12/27/2012   Rotator cuff tendinitis, right 11/15/2015   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 MOHS.   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/07/2021   Right zygoma, EDC 05/02/21   Statin intolerance (myalgias)    Supplemental oxygen dependent (2-3 L/[place])     Past Surgical History:  Procedure Laterality Date   AORTIC VALVE REPLACEMENT N/A 08/01/2016   29 mm CoreValve Evolut; Location: [facility]; Surgeon: Clent Jacks, MD   AV FISTULA PLACEMENT Right 06/27/2023   Procedure: ARTERIOVENOUS (AV) FISTULA CREATION (BRACHIALCEPHALIC);  Surgeon: Renford Dills, MD;  Location: [facility];  Service: Vascular;  Laterality: Right;   CARDIAC CATHETERIZATION N/A 12/26/2012   2v CAD; retained pigtail in myocardium --> transferred to [facility]; Location: [facility]; Surgeon: Despina Hick, MD   CATARACT EXTRACTION W/PHACO Left 10/03/2022   Procedure: CATARACT EXTRACTION PHACO AND INTRAOCULAR LENS PLACEMENT (IOC) LEFT DIABETIC 8.22 00:55.5;  Surgeon: Galen Manila, MD;  Location: [facility];  Service: Ophthalmology;  Laterality: Left;  sleep apnea   CATARACT EXTRACTION W/PHACO Right 10/17/2022   Procedure: CATARACT EXTRACTION PHACO  AND INTRAOCULAR LENS PLACEMENT (IOC) RIGHT DIABETIC;  Surgeon: Galen Manila, MD;  Location: [facility];  Service: Ophthalmology;  Laterality: Right;  4.79 0:37.4   COLONOSCOPY     COLONOSCOPY N/A 04/28/2023   Procedure: COLONOSCOPY;  Surgeon: Toledo, Boykin Nearing, MD;  Location: [facility];  Service: Gastroenterology;  Laterality: N/A;   KNEE ARTHROSCOPY WITH MEDIAL MENISECTOMY Left 12/02/2020   Procedure: KNEE ARTHROSCOPY WITH DEBRIDEMENT AND PARTIAL MEDIAL AND LATERAL MENISECTOMY;  Surgeon: Christena Flake, MD;  Location: [facility];  Service: Orthopedics;  Laterality: Left;   LITHOTRIPSY     PERCUTANEOUS REMOVAL INTRA-AORTIC BALLOON CATH N/A 12/26/2012   Procedure: PERCUTANEOUS REMOVAL INTRA-AORTIC BALLOON CATH; Surgeon: Heloise Ochoa, MD; Location: [facility]; Service: Cardiothoracic   POLYPECTOMY  04/28/2023   Procedure: POLYPECTOMY;  Surgeon: Norma Fredrickson, Boykin Nearing, MD;  Location: [facility];  Service: Gastroenterology;;   RIGHT HEART CATH Right 07/13/2016   Location: [facility]; Surgeon: Emilio Math, MD   TEE WITHOUT CARDIOVERSION Right 04/26/2023   Procedure: TRANSESOPHAGEAL ECHOCARDIOGRAM (TEE);  Surgeon: Laurier Nancy, MD;  Location: [facility];  Service: Cardiovascular;  Laterality: Right;   TRANSESOPHAGEAL ECHOCARDIOGRAM N/A 12/26/2012   Procedure: TRANSESOPHAGEAL ECHOCARDIOGRAPHY; Surgeon: Heloise Ochoa, MD; Location: [facility]; Service: Cardiothoracic   UMBILICAL HERNIA REPAIR N/A 02/13/2014   Procedure: LAPAROSCOPIC UMBILICAL HERNIA REPAIR; Surgeon: Merlinda Frederick, MD; Location: [facility]; Service: General Surgery      Allergies  Allergen Reactions   Nsaids Other (See Comments)    Avoid due to kidney disease    Statins Other (See Comments)    Myalgias   Nexletol [Bempedoic Acid] Diarrhea    fatigue   Silver Other (See Comments)   Tegaderm High Gelling Alginate [Wound Dressings] Rash    Current Outpatient Medications   Medication Sig Dispense Refill   acetaminophen (TYLENOL) 650 MG CR tablet Take 1,300 mg by mouth every 8 (eight) hours as needed for pain.     albuterol (VENTOLIN HFA) 108 (90 Base) MCG/ACT inhaler Inhale 2 puffs into the lungs every 6 (six) hours as needed for wheezing or shortness of breath.     Alcohol Swabs (DROPSAFE ALCOHOL PREP) 70 % PADS Apply 1 each topically as needed (With repatha and blood sugar checks.).     Ascorbic Acid (VITAMIN C) 1000 MG tablet Take 1,000 mg by mouth at bedtime.     busPIRone (BUSPAR) 5 MG tablet TAKE 1 TABLET BY MOUTH TWICE DAILY AS NEEDED (Patient taking differently: Take 5 mg by mouth at bedtime.) 60 tablet 3   calcitRIOL (ROCALTROL) 0.25 MCG capsule Take 0.25 mcg by mouth daily.     citalopram (CELEXA) 40 MG tablet TAKE 1 TABLET EVERY DAY (Patient taking differently: Take 40 mg by mouth every morning.) 90 tablet 3   colchicine 0.6 MG tablet Take 0.3 mg by mouth at bedtime.     Evolocumab 140 MG/ML SOAJ Inject 140 mg into the skin every 14 (fourteen)  days.     ezetimibe (ZETIA) 10 MG tablet TAKE 1 TABLET(10 MG) BY MOUTH AT BEDTIME (Patient taking differently: Take 10 mg by mouth at bedtime.) 90 tablet 3   febuxostat (ULORIC) 40 MG tablet Take 40 mg by mouth at bedtime.     fexofenadine (ALLEGRA) 180 MG tablet Take 180 mg by mouth daily as needed for allergies or rhinitis.     Fluticasone-Umeclidin-Vilant (TRELEGY ELLIPTA) 100-62.5-25 MCG/ACT AEPB Inhale 1 Inhalation into the lungs daily at 6 (six) AM. (Patient taking differently: Inhale 1 Inhalation into the lungs daily as needed (shortness of breath).) 60 each 11   gabapentin (NEURONTIN) 100 MG capsule TAKE 2 CAPSULES TWICE DAILY 360 capsule 3   gabapentin (NEURONTIN) 400 MG capsule TAKE 1 CAPSULE AT BEDTIME 90 capsule 3   HYDROcodone-acetaminophen (NORCO/VICODIN) 5-325 MG tablet Take 1 tablet by mouth every 6 (six) hours as needed for moderate pain (pain score 4-6) or severe pain (pain score 7-10). 30 tablet 0    metoprolol succinate (TOPROL-XL) 50 MG 24 hr tablet TAKE 1 TABLET EVERY DAY (Patient taking differently: Take 50 mg by mouth every morning.) 90 tablet 3   Multiple Vitamins-Minerals (PRESERVISION AREDS) CAPS Take 1 capsule by mouth 2 (two) times daily.     ondansetron (ZOFRAN-ODT) 4 MG disintegrating tablet Take 1 tablet (4 mg total) by mouth every 8 (eight) hours as needed for nausea or vomiting. 20 tablet 0   OXYGEN Inhale 2-3 L into the lungs See admin instructions. Used as needed throughout the day and continuous at bedtime     pantoprazole (PROTONIX) 40 MG tablet Take 40 mg by mouth every morning.     Simethicone (GAS-X PO) Take 1 tablet by mouth 2 (two) times daily as needed (flatulence).     sodium chloride (OCEAN) 0.65 % SOLN nasal spray Place 1 spray into both nostrils as needed for congestion.  0   spironolactone (ALDACTONE) 25 MG tablet Take 25 mg by mouth daily.     SUPER B COMPLEX/C PO Take 1 tablet by mouth daily.     torsemide (DEMADEX) 20 MG tablet Take 20 mg by mouth daily.     No current facility-administered medications for this visit.        Physical Exam There were no vitals taken for this visit. Gen:  WD/WN, NAD Skin: incision C/D/I; right brachiocephalic fistula good thrill good bruit     Assessment/Plan:  1. CKD (chronic kidney disease), stage IV (HCC) (Primary) Recommend:  The patient is doing well and currently has adequate dialysis access.  Flow pattern is quite good.  I have recommended that we wait 1 more month and then we are okay to begin cannulation.  The patient should have a duplex ultrasound of the dialysis access in 6 months. The patient will follow-up with me in the office after each ultrasound       Levora Dredge 07/18/2023, 8:52 PM   This note was created with Dragon medical transcription system.  Any errors from dictation are unintentional.

## 2023-07-19 ENCOUNTER — Ambulatory Visit (INDEPENDENT_AMBULATORY_CARE_PROVIDER_SITE_OTHER): Admitting: Vascular Surgery

## 2023-07-19 ENCOUNTER — Encounter (INDEPENDENT_AMBULATORY_CARE_PROVIDER_SITE_OTHER): Payer: Self-pay | Admitting: Vascular Surgery

## 2023-07-19 ENCOUNTER — Ambulatory Visit (INDEPENDENT_AMBULATORY_CARE_PROVIDER_SITE_OTHER)

## 2023-07-19 VITALS — BP 134/73 | HR 64 | Resp 18 | Ht 65.0 in | Wt 195.0 lb

## 2023-07-19 DIAGNOSIS — E1122 Type 2 diabetes mellitus with diabetic chronic kidney disease: Secondary | ICD-10-CM | POA: Diagnosis not present

## 2023-07-19 DIAGNOSIS — N186 End stage renal disease: Secondary | ICD-10-CM

## 2023-07-19 DIAGNOSIS — R809 Proteinuria, unspecified: Secondary | ICD-10-CM | POA: Diagnosis not present

## 2023-07-19 DIAGNOSIS — D631 Anemia in chronic kidney disease: Secondary | ICD-10-CM | POA: Diagnosis not present

## 2023-07-19 DIAGNOSIS — I129 Hypertensive chronic kidney disease with stage 1 through stage 4 chronic kidney disease, or unspecified chronic kidney disease: Secondary | ICD-10-CM | POA: Diagnosis not present

## 2023-07-19 DIAGNOSIS — N184 Chronic kidney disease, stage 4 (severe): Secondary | ICD-10-CM

## 2023-07-19 DIAGNOSIS — N185 Chronic kidney disease, stage 5: Secondary | ICD-10-CM | POA: Diagnosis not present

## 2023-07-19 DIAGNOSIS — N050 Unspecified nephritic syndrome with minor glomerular abnormality: Secondary | ICD-10-CM | POA: Diagnosis not present

## 2023-07-19 DIAGNOSIS — N2581 Secondary hyperparathyroidism of renal origin: Secondary | ICD-10-CM | POA: Diagnosis not present

## 2023-07-20 ENCOUNTER — Encounter (INDEPENDENT_AMBULATORY_CARE_PROVIDER_SITE_OTHER): Payer: Self-pay | Admitting: Vascular Surgery

## 2023-07-20 ENCOUNTER — Ambulatory Visit: Payer: Self-pay

## 2023-07-20 NOTE — Patient Outreach (Unsigned)
 Care Coordination   Follow Up Visit Note   07/20/2023 Name: [name] [name] MRN: [id number] DOB: December 08, 1945  [name] is a 78 y.o. year old male who sees Miki Kins, FNP for primary care. I spoke with  [name] and spouse [name] by phone today.  What matters to the patients health and wellness today?  Patient states he is feeling better. Wife states patients dialysis fistula was recently inserted. She states provider wants to wait approximately 4 weeks for maturation.  Patient and wife states fistula seems to be healing well. Denies any signs of infection.  Wife states patients GFR is down to 11.  Patient states he is adhering to provider recommendation regarding use of arm with fistula.  Wife states she and patient have completed the dialysis classes.  Wife states patients swelling in  feet/ legs has decreased.   She states they are working with someone at [facility] health to help with medication assistance for his febuxostat. Wife states they are also working on assistance for patients medication bills. Patient states he continues to have SOB with exertion. He reports wearing his oxygen 3-3.5 L.  He states his provider is aware of this.  Wife states patient has lost 5lbs. He reports today's weight is 194 lbs.  Patient states he is trying to adhere to a low sodium diet. Wife states patient occasionally has more salt than usual when they are eating out.     Goals Addressed             This Visit's Progress    Post hospital follow up management and education and overall management of chronic health conditions.       Interventions Today    Flowsheet Row Most Recent Value  Chronic Disease   Chronic disease during today's visit Chronic Obstructive Pulmonary Disease (COPD), Congestive Heart Failure (CHF), Chronic Kidney Disease/End Stage Renal Disease (ESRD), Other  [mouth pain]  General Interventions   General Interventions Discussed/Reviewed  General Interventions Reviewed, Labs, Doctor Visits  Labs Kidney Function  Doctor Visits Discussed/Reviewed Doctor Visits Reviewed  Exercise Interventions   Exercise Discussed/Reviewed Physical Activity  [Assessed current activity level. Advised to continue to wear oxygen as recommended by providers.]  Education Interventions   Education Provided Provided Education  [Reviewed COPD/ HF symptoms. Assessed for increase in symptoms. Advised to continue monitoring weight daily and recording. Advised to notify provider for increase in HF/ COPD symptoms or call 911 for severe symptoms/ SOB.]  Provided Verbal Education On Other  [Discussed patient having dialysis port insertion. Assessed for signs of infection. Confirmed patient/ spouse  had kidney dialysis education classes. Confirmed patient adhering to provider recommended activity restrictions post dialysis port placement.]  Nutrition Interventions   Nutrition Discussed/Reviewed Nutrition Reviewed, Decreasing salt  [Advised strict adherence to low sodium diet.]  Pharmacy Interventions   Pharmacy Dicussed/Reviewed Pharmacy Topics Reviewed  [medications reviewed and compliance discussed and advised.  Discussed patient needing medication assistance due to cost for Febuxostat.]              SDOH assessments and interventions completed:  No{THN Tip this will not be part of the note when signed-REQUIRED REPORT FIELD DO NOT DELETE (Optional):27901}     Care Coordination Interventions:  Yes, provided {THN Tip this will not be part of the note when signed-REQUIRED REPORT FIELD DO NOT DELETE (Optional):27901}  Follow up plan: {CCFOLLOWUP:27768}   Encounter Outcome:  Patient Visit Completed {THN Tip this will not be part of  the note when signed-REQUIRED REPORT FIELD DO NOT DELETE (Optional):27901}  George Ina RN, BSN, CCM [place]  [facility], Population Health Case Manager Phone: [phone]

## 2023-07-21 NOTE — Patient Instructions (Signed)
 Visit Information  Thank you for taking time to visit with me today. Please don't hesitate to contact me if I can be of assistance to you.   Following are the goals we discussed today:   Goals Addressed             This Visit's Progress    Post hospital follow up management and education and overall management of chronic health conditions.       Interventions Today    Flowsheet Row Most Recent Value  Chronic Disease   Chronic disease during today's visit Chronic Obstructive Pulmonary Disease (COPD), Congestive Heart Failure (CHF), Chronic Kidney Disease/End Stage Renal Disease (ESRD), Other  [mouth pain]  General Interventions   General Interventions Discussed/Reviewed General Interventions Reviewed, Labs, Doctor Visits  [evaluation of current treatment plan for listed health conditions and patients adherence to plan as establishe by provider. Assessed for COPD,HF and mouth pain. Assessed weight]  Labs Kidney Function  [discussed most recent renal function panel results.]  Doctor Visits Discussed/Reviewed Doctor Visits Reviewed  [reviewed upcoming provider visits. Advised to keep follow up visits with providers. Confirmed contact to [facility] billing office for medical bill assistance.]  Exercise Interventions   Exercise Discussed/Reviewed Physical Activity  [Assessed current activity level. Advised to continue to wear oxygen as recommended by providers. Discussed oxygen use due to SOB with exertion.]  Education Interventions   Education Provided Provided Education  [Reviewed COPD/ HF symptoms. Assessed for increase in symptoms. Advised to continue monitoring weight daily and recording. Advised to notify provider for increase in HF/ COPD symptoms or call 911 for severe symptoms/ SOB.]  Provided Verbal Education On Other  [Discussed patient having dialysis port insertion. Assessed for signs of infection. Confirmed patient/ spouse  had kidney dialysis education classes. Confirmed patient adhering  to provider recommended activity restrictions post dialysis port placement.]  Nutrition Interventions   Nutrition Discussed/Reviewed Nutrition Reviewed, Decreasing salt  [Advised strict adherence to low sodium diet.]  Pharmacy Interventions   Pharmacy Dicussed/Reviewed Pharmacy Topics Reviewed  [medications reviewed and compliance discussed and advised.  Discussed patient needing medication assistance due to cost for Febuxostat.]              Our next appointment is {CCNEXTVISITTYPE:27779} on *** at ***  Please call the care guide team at [phone] if you need to cancel or reschedule your appointment.   If you are experiencing a Mental Health or Behavioral Health Crisis or need someone to talk to, please call the Suicide and Crisis Lifeline: 988 call 1-800-273-TALK (toll free, 24 hour hotline)  Patient verbalizes understanding of instructions and care plan provided today and agrees to view in MyChart. Active MyChart status and patient understanding of how to access instructions and care plan via MyChart confirmed with patient.     George Ina RN, BSN, CCM [employer], Population Health Case Manager Phone: [phone]

## 2023-07-24 DIAGNOSIS — N186 End stage renal disease: Secondary | ICD-10-CM | POA: Diagnosis not present

## 2023-07-24 DIAGNOSIS — G4733 Obstructive sleep apnea (adult) (pediatric): Secondary | ICD-10-CM | POA: Diagnosis not present

## 2023-07-24 DIAGNOSIS — J439 Emphysema, unspecified: Secondary | ICD-10-CM | POA: Diagnosis not present

## 2023-07-24 DIAGNOSIS — Z9981 Dependence on supplemental oxygen: Secondary | ICD-10-CM | POA: Diagnosis not present

## 2023-07-27 DIAGNOSIS — H353211 Exudative age-related macular degeneration, right eye, with active choroidal neovascularization: Secondary | ICD-10-CM | POA: Diagnosis not present

## 2023-08-01 ENCOUNTER — Other Ambulatory Visit: Payer: Self-pay | Admitting: Family

## 2023-08-06 ENCOUNTER — Other Ambulatory Visit: Payer: Self-pay

## 2023-08-06 DIAGNOSIS — D631 Anemia in chronic kidney disease: Secondary | ICD-10-CM

## 2023-08-07 ENCOUNTER — Inpatient Hospital Stay: Payer: Medicare HMO | Attending: Oncology

## 2023-08-08 ENCOUNTER — Encounter: Payer: Self-pay | Admitting: Family

## 2023-08-08 NOTE — Assessment & Plan Note (Signed)
 Patient stable.  Well controlled with current therapy.   Continue current meds.

## 2023-08-08 NOTE — Assessment & Plan Note (Signed)
 Blood pressure well controlled with current medications.  Continue current therapy.  Will reassess at follow up.

## 2023-08-08 NOTE — Assessment & Plan Note (Signed)
 Continue current therapy for lipid control. Will modify as needed based on labwork results.

## 2023-08-08 NOTE — Assessment & Plan Note (Signed)
 Checking labs today. Will call pt. With results  Continue current diabetes POC, as patient has been well controlled on current regimen.  Will adjust meds if needed based on labs.

## 2023-08-09 ENCOUNTER — Other Ambulatory Visit: Payer: Self-pay

## 2023-08-09 DIAGNOSIS — D631 Anemia in chronic kidney disease: Secondary | ICD-10-CM | POA: Diagnosis not present

## 2023-08-09 DIAGNOSIS — R809 Proteinuria, unspecified: Secondary | ICD-10-CM | POA: Diagnosis not present

## 2023-08-09 DIAGNOSIS — N2581 Secondary hyperparathyroidism of renal origin: Secondary | ICD-10-CM | POA: Diagnosis not present

## 2023-08-09 DIAGNOSIS — N185 Chronic kidney disease, stage 5: Secondary | ICD-10-CM | POA: Diagnosis not present

## 2023-08-09 DIAGNOSIS — I129 Hypertensive chronic kidney disease with stage 1 through stage 4 chronic kidney disease, or unspecified chronic kidney disease: Secondary | ICD-10-CM | POA: Diagnosis not present

## 2023-08-09 DIAGNOSIS — N050 Unspecified nephritic syndrome with minor glomerular abnormality: Secondary | ICD-10-CM | POA: Diagnosis not present

## 2023-08-09 DIAGNOSIS — E1122 Type 2 diabetes mellitus with diabetic chronic kidney disease: Secondary | ICD-10-CM | POA: Diagnosis not present

## 2023-08-13 ENCOUNTER — Ambulatory Visit: Payer: Self-pay | Admitting: *Deleted

## 2023-08-13 ENCOUNTER — Other Ambulatory Visit: Payer: Self-pay

## 2023-08-13 NOTE — Patient Instructions (Addendum)
 Visit Information  Thank you for taking time to visit with me today. Please don't hesitate to contact me if I can be of assistance to you before our next scheduled appointment.  Our next appointment is by telephone on 08/27/23 at 1 pm Please call the care guide team at [phone] if you need to cancel or reschedule your appointment.   Following is a copy of your care plan:   Goals Addressed             This Visit's Progress    COMPLETED: Post hospital follow up management and education and overall management of chronic health conditions.        Duplicate goal- closure refer to VBCI RN Care Plan Interventions Today    Flowsheet Row Most Recent Value  Chronic Disease   Chronic disease during today's visit Chronic Obstructive Pulmonary Disease (COPD), Congestive Heart Failure (CHF), Chronic Kidney Disease/End Stage Renal Disease (ESRD), Other  [mouth pain]  General Interventions   General Interventions Discussed/Reviewed General Interventions Reviewed, Labs, Doctor Visits  [evaluation of current treatment plan for listed health conditions and patients adherence to plan as establishe by provider. Assessed for COPD,HF and mouth pain. Assessed weight]  Labs Kidney Function  [discussed most recent renal function panel results.]  Doctor Visits Discussed/Reviewed Doctor Visits Reviewed  [reviewed upcoming provider visits. Advised to keep follow up visits with providers. Confirmed contact to [facility] billing office for medical bill assistance.]  Exercise Interventions   Exercise Discussed/Reviewed Physical Activity  [Assessed current activity level. Advised to continue to wear oxygen  as recommended by providers. Discussed oxygen  use due to SOB with exertion.]  Education Interventions   Education Provided Provided Education  [Reviewed COPD/ HF symptoms. Assessed for increase in symptoms. Advised to continue monitoring weight daily and recording. Advised to notify provider for increase in HF/ COPD  symptoms or call 911 for severe symptoms/ SOB.]  Provided Verbal Education On Other  [Discussed patient having dialysis port insertion. Assessed for signs of infection. Confirmed patient/ spouse  had kidney dialysis education classes. Confirmed patient adhering to provider recommended activity restrictions post dialysis port placement.]  Nutrition Interventions   Nutrition Discussed/Reviewed Nutrition Reviewed, Decreasing salt  [Advised strict adherence to low sodium diet.]  Pharmacy Interventions   Pharmacy Dicussed/Reviewed Pharmacy Topics Reviewed  [medications reviewed and compliance discussed and advised.  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  Goal: Over the next 60 days the Caregiver Patient will verbalize basic understanding of CAD, CKD Stage 4, COPD, DMII, HLD, and HTN disease process and self health management plan as evidenced by responses for teach back method, improvements in lab &vital values   Interventions:   CAD Interventions: Assessed understanding of CAD diagnosis Medications reviewed including medications utilized in CAD treatment plan Provided education on importance of blood pressure control in management of CAD Counseled on importance of regular laboratory monitoring as prescribed Reviewed Importance of taking all medications as prescribed Reviewed Importance of attending all scheduled provider appointments Screening for signs and symptoms of depression related to chronic disease state Assessed social determinant of health barriers Discussed the use of Repatha for Hyperlipidemia, discussed administration of Repatha, Prior authorization process for  Repatha+ 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 Mr Flener is having moments of excessive daytime sleeping    Chronic Kidney Disease Interventions: Assessed the Patient & wife  understanding of chronic kidney disease    Evaluation of current treatment plan related to chronic kidney disease self management and patient's adherence to plan as established by provider      Reviewed medications with patient and discussed importance of compliance    Discussed plans with patient for ongoing care management follow up and provided patient with direct contact information for care management team    Provided education on kidney disease progression    Discussed the new Port and use for dialysis Last practice recorded BP readings:  BP Readings from Last 3 Encounters:  07/19/23 134/73  06/27/23 102/61  06/15/23 (!) 154/62   Most recent eGFR/CrCl:  Lab Results  Component Value Date   EGFR 16 (L) 03/06/2023    No components found for: "CRCL"    COPD Interventions: Advised patient to track and manage COPD triggers Discussed the use of home oxygen  to manage COPD   Diabetes Interventions: Assessed patient's understanding of A1c goal: <7% Provided education to patient about basic DM disease process Reviewed medications with patient and discussed importance of medication adherence Counseled on importance of regular laboratory monitoring as prescribed Discussed plans with patient for ongoing care management follow up and provided patient with direct contact information for care management team Review of patient status, including review of consultants reports, relevant laboratory and other test results, and medications completed Lab Results  Component Value Date   HGBA1C 5.5 03/06/2023    Hypertension Interventions: Last practice recorded BP readings:  BP Readings from Last 3 Encounters:  07/19/23 134/73  06/27/23 102/61  06/15/23 (!) 154/62   Most recent eGFR/CrCl:  Lab Results  Component  Value Date   EGFR 16 (L) 03/06/2023    No components found for: "CRCL"  Evaluation of current treatment plan related to hypertension self management and patient's adherence to plan as established by provider Advised patient, providing education and rationale, to monitor blood pressure daily and record, calling PCP for findings outside established parameters Provided education on prescribed diet heart healthy low carb  Patient Self-Care Activities:  Attend all scheduled provider appointments Call pharmacy for medication refills 3-7 days in advance of running out of medications Call provider office for new concerns or questions  Take medications as prescribed   eliminate symptom triggers at home check blood pressure daily report new symptoms to your doctor eat more whole grains, fruits and vegetables, lean meats and healthy fats  Plan:  Telephone follow up appointment with care management team member scheduled for:  08/27/23 The patient has been provided with contact information for the care management team and has been advised to call with any health related questions or concerns.  Sent education via my chart's AVS on narcolepsy, repatha        Please call the Suicide and Crisis Lifeline: 988 call the [place]  National Suicide Prevention Lifeline: 850 688 3414 or TTY: 513-636-5651 TTY (760)116-5750) to talk to a trained counselor call 1-800-273-TALK (toll free, 24 hour hotline) go to [facility] [address], [place] [phone]) call 911 if you are experiencing a Mental Health or Behavioral Health Crisis or need someone to talk to.  Patient verbalizes understanding of instructions and care plan provided today and agrees to view in MyChart. Active MyChart status and patient understanding of how to access instructions and care plan via  MyChart confirmed with patient.      Aarion Kittrell L. Mcarthur Speedy, RN, BSN, CCM [place]  [facility], [facility] RN Care Manager Direct Dial: [phone]  Fax: [phone]

## 2023-08-14 MED ORDER — PANTOPRAZOLE SODIUM 40 MG PO TBEC: 40.0000 mg | DELAYED_RELEASE_TABLET | ORAL | 3 refills | Status: DC

## 2023-08-16 DIAGNOSIS — N184 Chronic kidney disease, stage 4 (severe): Secondary | ICD-10-CM | POA: Diagnosis not present

## 2023-08-16 DIAGNOSIS — E875 Hyperkalemia: Secondary | ICD-10-CM | POA: Diagnosis not present

## 2023-08-16 DIAGNOSIS — D631 Anemia in chronic kidney disease: Secondary | ICD-10-CM | POA: Diagnosis not present

## 2023-08-16 DIAGNOSIS — E1122 Type 2 diabetes mellitus with diabetic chronic kidney disease: Secondary | ICD-10-CM | POA: Diagnosis not present

## 2023-08-16 DIAGNOSIS — N185 Chronic kidney disease, stage 5: Secondary | ICD-10-CM | POA: Diagnosis not present

## 2023-08-16 DIAGNOSIS — I10 Essential (primary) hypertension: Secondary | ICD-10-CM | POA: Diagnosis not present

## 2023-08-16 DIAGNOSIS — R809 Proteinuria, unspecified: Secondary | ICD-10-CM | POA: Diagnosis not present

## 2023-08-16 DIAGNOSIS — N2581 Secondary hyperparathyroidism of renal origin: Secondary | ICD-10-CM | POA: Diagnosis not present

## 2023-08-16 DIAGNOSIS — N050 Unspecified nephritic syndrome with minor glomerular abnormality: Secondary | ICD-10-CM | POA: Diagnosis not present

## 2023-08-16 DIAGNOSIS — I129 Hypertensive chronic kidney disease with stage 1 through stage 4 chronic kidney disease, or unspecified chronic kidney disease: Secondary | ICD-10-CM | POA: Diagnosis not present

## 2023-08-20 ENCOUNTER — Other Ambulatory Visit: Payer: Self-pay

## 2023-08-20 DIAGNOSIS — I129 Hypertensive chronic kidney disease with stage 1 through stage 4 chronic kidney disease, or unspecified chronic kidney disease: Secondary | ICD-10-CM | POA: Diagnosis not present

## 2023-08-20 DIAGNOSIS — R809 Proteinuria, unspecified: Secondary | ICD-10-CM | POA: Diagnosis not present

## 2023-08-20 DIAGNOSIS — N184 Chronic kidney disease, stage 4 (severe): Secondary | ICD-10-CM | POA: Diagnosis not present

## 2023-08-20 DIAGNOSIS — E875 Hyperkalemia: Secondary | ICD-10-CM | POA: Diagnosis not present

## 2023-08-20 DIAGNOSIS — N185 Chronic kidney disease, stage 5: Secondary | ICD-10-CM | POA: Diagnosis not present

## 2023-08-20 DIAGNOSIS — D631 Anemia in chronic kidney disease: Secondary | ICD-10-CM | POA: Diagnosis not present

## 2023-08-20 DIAGNOSIS — N2581 Secondary hyperparathyroidism of renal origin: Secondary | ICD-10-CM | POA: Diagnosis not present

## 2023-08-20 DIAGNOSIS — E1122 Type 2 diabetes mellitus with diabetic chronic kidney disease: Secondary | ICD-10-CM | POA: Diagnosis not present

## 2023-08-20 DIAGNOSIS — N050 Unspecified nephritic syndrome with minor glomerular abnormality: Secondary | ICD-10-CM | POA: Diagnosis not present

## 2023-08-20 DIAGNOSIS — I10 Essential (primary) hypertension: Secondary | ICD-10-CM | POA: Diagnosis not present

## 2023-08-20 NOTE — Patient Outreach (Signed)
 Complex Care Management   Visit Note  08/20/2023 Late entry for 08/13/23   Name:  [name] [name] MRN: [id number] DOB: 1945-05-23  Situation: Referral received for Complex Care Management related to Bacteremia, to Streptococci  I obtained verbal consent from Patient And wife [name] [name] .  Visit completed with Marlou Sims P & Barnabas Lia  on the phone  Background:   Past Medical History:  Diagnosis Date   Accidental cut, puncture, perforation, or hemorrhage during heart catheterization 12/27/2012   Acute kidney injury superimposed on stage 4 chronic kidney disease (HCC) 07/27/2020   Acute on chronic combined systolic and diastolic CHF (congestive heart failure) (HCC) 04/25/2023   Acute on chronic respiratory failure with hypoxia (HCC) 04/22/2023   Adopted    AKI (acute kidney injury) (HCC) 02/05/2023   ANA positive 07/12/2015   Aortic atherosclerosis (HCC)    Aortic stenosis, severe    a.) s/p TAVR on 08/01/2016: 29 mm CoreValve Evolut placed   Arthritis    Bacteremia due to Streptococcus 04/24/2023   Basal cell carcinoma 01/04/2010   Right sup. post. helix. Excised 03/02/2010   BCC (basal cell carcinoma of skin) 11/24/2020   R neck below the ear, EDC   BPH (benign prostatic hyperplasia)    CAD (coronary artery disease) 12/26/2012   a.) LHC 12/26/2012: 50% mLAD, 80% mLCx, 100% pRCA --> CVTS at [facility] --> decided again revascularization due to insig LAD disease and the fact that RCA lesion was chronic   CAP (community acquired pneumonia) 07/26/2020   Chronic respiratory failure with hypoxia (HCC)    Claudication of both lower extremities (HCC)    Complete tear of right rotator cuff 11/15/2015   Complex tear of lateral meniscus of left knee as current injury 12/06/2020   Complex tear of medial meniscus of left knee as current injury 10/18/2020   COPD (chronic obstructive pulmonary disease) (HCC)    Diarrhea 02/04/2023   Diet-controlled type 2 diabetes mellitus (HCC)     Diverticulosis    Dyspnea    Electrolyte abnormality 02/04/2023   ESRD (end stage renal disease) (HCC)    GERD (gastroesophageal reflux disease)    Gout    Heart murmur    HFrEF (heart failure with reduced ejection fraction) (HCC)    a.) s/p TAVR on 08/01/2016: 29 mm CoreValve Evolut placed   History of bilateral cataract extraction    History of hiatal hernia    HLD (hyperlipidemia)    Hyperkalemia 07/30/2020   Hypertension    Hypomagnesemia 02/05/2023   Hypothyroidism    IDA (iron deficiency anemia)    Migraines    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   a.) TTE 04/24/2023: moderate (MPG 7.5 mmHG)   OSA on CPAP    Pneumonia 07/2020   Pulmonary hypertension (HCC)    a.) TTE 08/17/2022: "mild pHTN" per echo report; b.) TTE 04/24/2023: RVSP 39.1   Recurrent nephrolithiasis 12/27/2012   Rotator cuff tendinitis, right 11/15/2015   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 MOHS.   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/07/2021   Right zygoma, EDC 05/02/21   Statin intolerance (myalgias)    Supplemental oxygen   dependent (2-3 L/[employer])     Assessment: Patient Reported Symptoms:  Cognitive Cognitive Status: Alert and oriented to person, place, and time, Normal speech and language skills Cognitive/Intellectual Conditions Management [RPT]: None reported or documented in medical history or problem list   Health Maintenance Behaviors: Annual physical exam, Hobbies, Sleep adequate Healing Pattern: Average Health Facilitated by: Pain control, Rest  Neurological Neurological Review of Symptoms: Other: [place] Neurological Symptoms/Conditions  [RPT]: noted increase sleeping during the day at any moment, discussion about decreasing daytime sleeping, Narcolepsy, possible increase DM values causing sleepiness Neurological Management Strategies: Adequate rest, Routine screening Neurological Self-Management Outcome: 3 (uncertain)  HEENT   HEENT Conditions: Vision problem(s) Vision Problems: blindness/vision loss (glasses) HEENT Management Strategies: Adequate rest, Routine screening HEENT Self-Management Outcome: 3 (uncertain) Vision problem(s)  Cardiovascular Cardiovascular Symptoms Reported: Fatigue Does patient have uncontrolled Hypertension?: Yes Is patient checking Blood Pressure at home?: No (at times) Cardiovascular Conditions: Coronary artery disease, Heart failure, High blood cholesterol, Hypertension Cardiovascular Management Strategies: Adequate rest, Diet modification, Medication therapy, Routine screening Cardiovascular Self-Management Outcome: 3 (uncertain)  Respiratory Respiratory Symptoms Reported: Shortness of breath Respiratory Conditions: COPD, Sleep disordered breathing, Shortness of breath Respiratory Self-Management Outcome: 3 (uncertain)  Endocrine Patient reports the following symptoms related to hypoglycemia or hyperglycemia : Increased thirst, Weakness or fatigue, Blurry vision, Other Other symptoms related to hypoglycemia or hyperglycemia: dizziness Is patient diabetic?: Yes Is patient checking blood sugars at home?: Yes Endocrine Conditions: Diabetes, Hypoglycemia (nondiabetic) Endocrine Management Strategies: Adequate rest, Diet modification, Medication therapy, Routine screening Endocrine Self-Management Outcome: 4 (good)  Gastrointestinal   Gastrointestinal Conditions: Reflux/heartburn Gastrointestinal Management Strategies: Adequate rest, Medication therapy, Diet modification Gastrointestinal Self-Management Outcome: 4 (good) Nutrition Risk Screen (CP): No indicators present  Genitourinary  Genitourinary Symptoms Reported: Frequency, Other Other Genitourinary Symptoms: incomplete emptying, changes in urinary stream Genitourinary Conditions: Chronic kidney disease, Difficulty voiding, Frequency, Other Other Genitourinary Conditions: BPH Genitourinary Management Strategies: Adequate rest, Medication therapy, Hemodialysis Hemodialysis Schedule: To begin soon - pending a schedule Genitourinary Self-Management Outcome: 3 (uncertain)  Integumentary Integumentary Symptoms Reported: Bruising Skin Management Strategies: Adequate rest, Routine screening Skin Self-Management Outcome: 4 (good)  Musculoskeletal Musculoskelatal Symptoms Reviewed: Muscle pain, Weakness, Difficulty walking Additional Musculoskeletal Details: plica syndrome of left knee Musculoskeletal Conditions: Unsteady gait, Joint pain Musculoskeletal Management Strategies: Adequate rest, Medication therapy, Routine screening Musculoskeletal Self-Management Outcome: 3 (uncertain)      Psychosocial Psychosocial Symptoms Reported: Anxiety - if selected complete GAD, Depression - if selected complete PHQ 2-9 Behavioral Health Conditions: Anxiety, Depression Behavioral Management Strategies: Adequate rest Behavioral Health Self-Management Outcome: 3 (uncertain) Major Change/Loss/Stressor/Fears (CP): Medical condition, self Techniques to Cope with Loss/Stress/Change: Diversional activities Quality of Family Relationships: helpful, involved, supportive Do you feel physically threatened by others?: No      08/20/2023    9:53 AM  Depression screen PHQ 2/9  Decreased Interest 0  Down, Depressed, Hopeless 0  PHQ - 2 Score 0    There were no vitals filed for this visit.  Medications Reviewed Today     Reviewed by Arlyce Berger, RN (Registered Nurse) on 08/13/23 at 1409  Med List Status: <None>   Medication Order Taking? Sig Documenting Provider Last Dose Status Informant  acetaminophen  (TYLENOL ) 650 MG CR tablet  [id number]  Take 1,300 mg by mouth every 8 (eight) hours as needed for pain. [provider]  Active Spouse/Significant Other  albuterol  (VENTOLIN  HFA) 108 (90 Base) MCG/ACT inhaler [id number]  Inhale 2 puffs into the lungs every 6 (six) hours as  needed for wheezing or shortness of breath. [provider]  Active Spouse/Significant Other  Alcohol Swabs (DROPSAFE ALCOHOL PREP) 70 % PADS [id number]  Apply 1 each topically as needed (With repatha and blood sugar checks.). [provider]  Active Spouse/Significant Other  Ascorbic Acid  (VITAMIN C ) 1000 MG tablet [id number]  Take 1,000 mg by mouth at bedtime. [provider]  Active Spouse/Significant Other  busPIRone  (BUSPAR ) 5 MG tablet [id number]  TAKE 1 TABLET BY MOUTH TWICE DAILY AS NEEDED  Patient taking differently: Take 5 mg by mouth at bedtime.   Trenda Frisk, FNP  Active Spouse/Significant Other  calcitRIOL  (ROCALTROL ) 0.25 MCG capsule [id number]  Take 0.25 mcg by mouth daily. [provider]  Active Spouse/Significant Other  citalopram  (CELEXA ) 40 MG tablet [id number]  TAKE 1 TABLET EVERY DAY  Patient taking differently: Take 40 mg by mouth every morning.   Trenda Frisk, FNP  Active Spouse/Significant Other  colchicine  0.6 MG tablet [id number]  Take 0.3 mg by mouth at bedtime. [provider]  Active Spouse/Significant Other  Evolocumab 140 MG/ML SOAJ [id number]  Inject 140 mg into the skin every 14 (fourteen) days. [provider]  Active   ezetimibe  (ZETIA ) 10 MG tablet [id number]  Take 1 tablet (10 mg total) by mouth at bedtime. Trenda Frisk, FNP  Active   febuxostat  (ULORIC ) 40 MG tablet [id number]  Take 40 mg by mouth at bedtime. [provider]  Active Spouse/Significant Other  fexofenadine (ALLEGRA) 180 MG tablet [id number]  Take 180 mg by mouth daily as needed for allergies or rhinitis. [provider]  Active Spouse/Significant Other   Fluticasone -Umeclidin-Vilant (TRELEGY ELLIPTA ) 100-62.5-25 MCG/ACT AEPB [id number]  Inhale 1 Inhalation into the lungs daily at 6 (six) AM.  Patient taking differently: Inhale 1 Inhalation into the lungs daily as needed (shortness of breath).   Trenda Frisk, FNP  Active Spouse/Significant Other  gabapentin  (NEURONTIN ) 100 MG capsule [id number] Yes TAKE 2 CAPSULES TWICE DAILY Trenda Frisk, FNP Taking Active Spouse/Significant Other  gabapentin  (NEURONTIN ) 400 MG capsule [id number] Yes TAKE 1 CAPSULE AT BEDTIME Trenda Frisk, FNP Taking Active Spouse/Significant Other  HYDROcodone -acetaminophen  (NORCO/VICODIN) 5-325 MG tablet [id number]  Take 1 tablet by mouth every 6 (six) hours as needed for moderate pain (pain score 4-6) or severe pain (pain score 7-10). Schnier, Ninette Basque, MD  Active   metoprolol  succinate (TOPROL -XL) 50 MG 24 hr tablet [id number]  TAKE 1 TABLET EVERY DAY  Patient taking differently: Take 50 mg by mouth every morning.   Trenda Frisk, FNP  Active Spouse/Significant Other  Multiple Vitamins-Minerals (PRESERVISION AREDS) CAPS [id number]  Take 1 capsule by mouth 2 (two) times daily. [provider]  Active Spouse/Significant Other  ondansetron  (ZOFRAN -ODT) 4 MG disintegrating tablet [id number]  Take 1 tablet (4 mg total) by mouth every 8 (eight) hours as needed for nausea or vomiting. Jacquie Maudlin, MD  Active Spouse/Significant Other  OXYGEN  [id number]  Inhale 2-3 L into the lungs See admin instructions. Used as needed throughout the day and continuous at bedtime [provider]  Active Spouse/Significant Other  pantoprazole  (PROTONIX ) 40 MG tablet [id number]  Take 40 mg by mouth every morning. [provider]  Active Spouse/Significant Other  Simethicone  (GAS-X PO) [id number]  Take 1 tablet by mouth 2 (two) times daily as needed (flatulence). [provider]  Active Spouse/Significant Other  sodium chloride  (OCEAN) 0.65 % SOLN nasal  spray [id number]  Place 1 spray into both nostrils as needed for congestion.  Domingo Friend, NP  Active Spouse/Significant Other  spironolactone (ALDACTONE) 25 MG tablet [id number]  Take 25 mg by mouth daily. [provider]  Active   SUPER B COMPLEX/C PO [id number]  Take 1 tablet by mouth daily. [provider]  Active Spouse/Significant Other  torsemide  (DEMADEX ) 20 MG tablet [id number] Yes Take 20 mg by mouth daily. [provider] Taking Active Spouse/Significant Other            Recommendation:   PCP Follow-up Review the education provided via AVS on Narcolepsy, iron deficiency anemia, COPD action plan, CKD/diet, hemodialysis, Diabetes/neuropathy, sleep apnea, high cholesterol, BPH & discuss any questions with pcp/specialist or RN CM Mr Woodrum encouraged to increase fluid intake other than sodas. Request follow up with MD office related to pending Repatha prior authorization & start date   Follow Up Plan:   Telephone follow up appointment date/time:  08/27/23 1 pm   Dilan Novosad L. Mcarthur Speedy, RN, BSN, CCM [place]  [facility], [facility] RN Care Manager Direct Dial: [phone]  Fax: [phone]

## 2023-08-24 ENCOUNTER — Telehealth (INDEPENDENT_AMBULATORY_CARE_PROVIDER_SITE_OTHER): Payer: Self-pay

## 2023-08-24 NOTE — Telephone Encounter (Signed)
 Spoke with the patient and he is scheduled with [name] for a permcath placement on 08/28/23 with a 11:00 am arrival time to the [facility]. Pre-procedure instructions were discussed and will be sent to Mychart.

## 2023-08-24 NOTE — Telephone Encounter (Signed)
 Dialysis should let us  know that his fistula is not working.

## 2023-08-27 ENCOUNTER — Encounter: Payer: Self-pay | Admitting: *Deleted

## 2023-08-27 ENCOUNTER — Other Ambulatory Visit: Payer: Self-pay | Admitting: *Deleted

## 2023-08-27 ENCOUNTER — Other Ambulatory Visit: Payer: Self-pay

## 2023-08-27 NOTE — Patient Outreach (Signed)
 Complex Care Management   Visit Note  08/27/2023  Name:  [name] [name] MRN: [id number] DOB: 12-07-1945  Situation: Referral received for Complex Care Management related to Chronic Kidney Disease and  Bacteremia, to Streptococci   I obtained verbal consent from Patient.  Visit completed with Drystan P Sferrazza "paul" and wife Edmundo Lonsdale  on the phone  Background:   Past Medical History:  Diagnosis Date   Accidental cut, puncture, perforation, or hemorrhage during heart catheterization 12/27/2012   Acute kidney injury superimposed on stage 4 chronic kidney disease (HCC) 07/27/2020   Acute on chronic combined systolic and diastolic CHF (congestive heart failure) (HCC) 04/25/2023   Acute on chronic respiratory failure with hypoxia (HCC) 04/22/2023   Adopted    AKI (acute kidney injury) (HCC) 02/05/2023   ANA positive 07/12/2015   Aortic atherosclerosis (HCC)    Aortic stenosis, severe    a.) s/p TAVR on 08/01/2016: 29 mm CoreValve Evolut placed   Arthritis    Bacteremia due to Streptococcus 04/24/2023   Basal cell carcinoma 01/04/2010   Right sup. post. helix. Excised 03/02/2010   BCC (basal cell carcinoma of skin) 11/24/2020   R neck below the ear, EDC   BPH (benign prostatic hyperplasia)    CAD (coronary artery disease) 12/26/2012   a.) LHC 12/26/2012: 50% mLAD, 80% mLCx, 100% pRCA --> [facility] --> decided again revascularization due to insig LAD disease and the fact that RCA lesion was chronic   CAP (community acquired pneumonia) 07/26/2020   Chronic respiratory failure with hypoxia (HCC)    Claudication of both lower extremities (HCC)    Complete tear of right rotator cuff 11/15/2015   Complex tear of lateral meniscus of left knee as current injury 12/06/2020   Complex tear of medial meniscus of left knee as current injury 10/18/2020   COPD (chronic obstructive pulmonary disease) (HCC)    Diarrhea 02/04/2023   Diet-controlled type 2 diabetes mellitus (HCC)     Diverticulosis    Dyspnea    Electrolyte abnormality 02/04/2023   ESRD (end stage renal disease) (HCC)    GERD (gastroesophageal reflux disease)    Gout    Heart murmur    HFrEF (heart failure with reduced ejection fraction) (HCC)    a.) s/p TAVR on 08/01/2016: 29 mm CoreValve Evolut placed   History of bilateral cataract extraction    History of hiatal hernia    HLD (hyperlipidemia)    Hyperkalemia 07/30/2020   Hypertension    Hypomagnesemia 02/05/2023   Hypothyroidism    IDA (iron deficiency anemia)    Migraines    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   a.) TTE 04/24/2023: moderate (MPG 7.5 mmHG)   OSA on CPAP    Pneumonia 07/2020   Pulmonary hypertension (HCC)    a.) TTE 08/17/2022: "mild pHTN" per echo report; b.) TTE 04/24/2023: RVSP 39.1   Recurrent nephrolithiasis 12/27/2012   Rotator cuff tendinitis, right 11/15/2015   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 MOHS.   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/07/2021   Right zygoma, EDC 05/02/21   Statin intolerance (myalgias)    Supplemental oxygen   dependent (2-3 L/[place])     Assessment: Patient Reported Symptoms:  Cognitive Cognitive Status: Able to follow simple commands, Alert and oriented to person, place, and time, Insightful and able to interpret abstract concepts, Normal speech and language skills Cognitive/Intellectual Conditions Management [RPT]: None reported or documented in medical history or problem list   Health Maintenance Behaviors: Annual physical exam, Hobbies, Sleep adequate Healing Pattern: Average Health Facilitated by: Pain control, Rest   Neurological Neurological Review of Symptoms: Not assessed    HEENT HEENT Symptoms Reported: Change or loss of hearing (HOH has hearing aides) HEENT Conditions: Ear problem(s), Vision problem(s) Vision Problems: blindness/vision loss HEENT Management Strategies: Adequate rest, Coping strategies, Medical device, Routine screening HEENT Self-Management Outcome: 4 (good) Ear problem(s), Vision problem(s)  Cardiovascular Cardiovascular Symptoms Reported: Fatigue Does patient have uncontrolled Hypertension?: Yes Is patient checking Blood Pressure at home?: No Cardiovascular Conditions: Coronary artery disease, Heart failure, High blood cholesterol, Hypertension Cardiovascular Management Strategies: Adequate rest, Diet modification, Medication therapy, Medical device, Routine screening Cardiovascular Self-Management Outcome: 4 (good)  Respiratory Respiratory Symptoms Reported: Not assesed    Endocrine Patient reports the following symptoms related to hypoglycemia or hyperglycemia : Not assessed    Gastrointestinal Gastrointestinal Symptoms Reported: No symptoms reported Gastrointestinal Conditions: Reflux/heartburn Gastrointestinal Management Strategies: Adequate rest, Diet modification, Medication therapy Gastrointestinal Self-Management Outcome: 4 (good) Nutrition Risk Screen (CP): No indicators present  Genitourinary Genitourinary Symptoms Reported: Frequency Other Genitourinary Symptoms: Had difficulty with fistula will get permacath on 08/28/23 Dr Alfonse Iha to go to HD on 08/29/23 Genitourinary Conditions: Chronic kidney disease, Difficulty voiding, Frequency Other Genitourinary Conditions: BPH Genitourinary Management Strategies: Adequate rest, Coping strategies, Fluid modification, Hemodialysis Hemodialysis Schedule: to return to HD on Wednesday 08/29/23 Genitourinary Self-Management Outcome: 3 (uncertain)  Integumentary Integumentary Symptoms Reported: Bruising, Skin changes Additional  Integumentary Details: has various bruising to right arm after fistula attempt Reports a circular site on right forearm without bleeding, pain, swelling, redness he reports he can feel the beating of his heart when he puts his finger over the site. Continues to squeeze his ball as instructed- prevent clotting, maturing fistula Skin Conditions: Other Other Skin Conditions: bruising to right arm Skin Management Strategies: Adequate rest, Coping strategies, Routine screening Skin Self-Management Outcome: 4 (good)  Musculoskeletal Musculoskelatal Symptoms Reviewed: No symptoms reported   Falls in the past year?: No Number of falls in past year: 1 or less Was there an injury with Fall?: No Fall Risk Category Calculator: 0 Patient Fall Risk Level: Low Fall Risk Patient at Risk for Falls Due to: Other (Comment) (HOH) Fall risk Follow up: Falls evaluation completed  Psychosocial Psychosocial Symptoms Reported: No symptoms reported            08/27/2023    2:02 PM  Depression screen PHQ 2/9  Decreased Interest 0  Down, Depressed, Hopeless 0  PHQ - 2 Score 0    There were no vitals filed for this visit.  Medications Reviewed Today   Medications were not reviewed in this encounter     Recommendation:   PCP Follow-up  Follow Up Plan:   Telephone follow up appointment date/time:  09/03/23 1 pm   Raisa Ditto L. Mcarthur Speedy, RN, BSN, CCM [place]  [facility], [facility] RN Care Manager Direct Dial: [phone]  Fax: [phone]

## 2023-08-27 NOTE — Patient Instructions (Addendum)
 Visit Information  Thank you for taking time to visit with me today. Please don't hesitate to contact me if I can be of assistance to you before our next scheduled appointment.  Your next care management appointment is by telephone on 09/03/23 at 1 pm  Please report any increase pain, redness or swelling for the site on your arm or right side chest. Always keep your right chest dialysis port protected when taking showers. If you have a gauze dressing on and it gets wet, immediately remove it, dry the area and put another protective dressing on. DO NOT LET THE CHEST SITE GET SATURATED  Please read the attached education   Please call the care guide team at [phone] if you need to cancel, schedule, or reschedule an appointment.   Please call the Suicide and Crisis Lifeline: 988 call the [place]  National Suicide Prevention Lifeline: 934-150-0164 or TTY: 2237334450 TTY 574-829-0999) to talk to a trained counselor call 1-800-273-TALK (toll free, 24 hour hotline) call the [facility]: ([phone]) [phone] call 911 if you are experiencing a Mental Health or Behavioral Health Crisis or need someone to talk to.  Tyronn Golda L. Mcarthur Speedy, RN, BSN, CCM [place]  [facility], [facility] RN Care Manager Direct Dial: [phone]  Fax: [phone]

## 2023-08-28 ENCOUNTER — Encounter
Admission: RE | Disposition: A | Discharge status: Home / Self Care | Payer: Self-pay | Source: Home / Self Care | Attending: Vascular Surgery

## 2023-08-28 ENCOUNTER — Other Ambulatory Visit: Payer: Self-pay

## 2023-08-28 ENCOUNTER — Observation Stay
Admission: RE | Admit: 2023-08-28 | Discharge: 2023-08-29 | Disposition: A | Discharge status: Home / Self Care | Attending: Internal Medicine | Admitting: Internal Medicine

## 2023-08-28 ENCOUNTER — Encounter: Payer: Self-pay | Admitting: Internal Medicine

## 2023-08-28 DIAGNOSIS — Z87891 Personal history of nicotine dependence: Secondary | ICD-10-CM | POA: Insufficient documentation

## 2023-08-28 DIAGNOSIS — Z136 Encounter for screening for cardiovascular disorders: Secondary | ICD-10-CM | POA: Diagnosis not present

## 2023-08-28 DIAGNOSIS — N186 End stage renal disease: Secondary | ICD-10-CM | POA: Diagnosis not present

## 2023-08-28 DIAGNOSIS — J449 Chronic obstructive pulmonary disease, unspecified: Secondary | ICD-10-CM | POA: Diagnosis not present

## 2023-08-28 DIAGNOSIS — E039 Hypothyroidism, unspecified: Secondary | ICD-10-CM | POA: Insufficient documentation

## 2023-08-28 DIAGNOSIS — I5020 Unspecified systolic (congestive) heart failure: Secondary | ICD-10-CM | POA: Diagnosis present

## 2023-08-28 DIAGNOSIS — N185 Chronic kidney disease, stage 5: Secondary | ICD-10-CM | POA: Diagnosis not present

## 2023-08-28 DIAGNOSIS — Z85828 Personal history of other malignant neoplasm of skin: Secondary | ICD-10-CM | POA: Diagnosis not present

## 2023-08-28 DIAGNOSIS — E669 Obesity, unspecified: Secondary | ICD-10-CM | POA: Diagnosis not present

## 2023-08-28 DIAGNOSIS — Z6839 Body mass index (BMI) 39.0-39.9, adult: Secondary | ICD-10-CM | POA: Diagnosis not present

## 2023-08-28 DIAGNOSIS — J9611 Chronic respiratory failure with hypoxia: Secondary | ICD-10-CM | POA: Diagnosis not present

## 2023-08-28 DIAGNOSIS — G4733 Obstructive sleep apnea (adult) (pediatric): Secondary | ICD-10-CM

## 2023-08-28 DIAGNOSIS — Z79899 Other long term (current) drug therapy: Secondary | ICD-10-CM | POA: Diagnosis not present

## 2023-08-28 DIAGNOSIS — Z992 Dependence on renal dialysis: Secondary | ICD-10-CM | POA: Diagnosis not present

## 2023-08-28 DIAGNOSIS — I132 Hypertensive heart and chronic kidney disease with heart failure and with stage 5 chronic kidney disease, or end stage renal disease: Secondary | ICD-10-CM | POA: Insufficient documentation

## 2023-08-28 DIAGNOSIS — I10 Essential (primary) hypertension: Secondary | ICD-10-CM | POA: Diagnosis present

## 2023-08-28 DIAGNOSIS — E875 Hyperkalemia: Secondary | ICD-10-CM | POA: Diagnosis not present

## 2023-08-28 DIAGNOSIS — E1122 Type 2 diabetes mellitus with diabetic chronic kidney disease: Secondary | ICD-10-CM | POA: Diagnosis not present

## 2023-08-28 LAB — BASIC METABOLIC PANEL WITH GFR
Anion gap: 11 (ref 5–15)
BUN: 68 mg/dL — ABNORMAL HIGH (ref 8–23)
CO2: 22 mmol/L (ref 22–32)
Calcium: 8.8 mg/dL — ABNORMAL LOW (ref 8.9–10.3)
Chloride: 108 mmol/L (ref 98–111)
Creatinine, Ser: 4.78 mg/dL — ABNORMAL HIGH (ref 0.61–1.24)
GFR, Estimated: 12 mL/min — ABNORMAL LOW (ref >60)
Glucose, Bld: 100 mg/dL — ABNORMAL HIGH (ref 70–99)
Potassium: 5.2 mmol/L — ABNORMAL HIGH (ref 3.5–5.1)
Sodium: 141 mmol/L (ref 135–145)

## 2023-08-28 LAB — POTASSIUM (ARMC VASCULAR LAB ONLY): Potassium (ARMC vascular lab): 5.7 mmol/L — ABNORMAL HIGH (ref 3.5–5.1)

## 2023-08-28 SURGERY — DIALYSIS/PERMA CATHETER INSERTION
Anesthesia: Moderate Sedation

## 2023-08-28 MED ORDER — CITALOPRAM HYDROBROMIDE 20 MG PO TABS
40.0000 mg | ORAL_TABLET | Freq: Every day | ORAL | Status: DC
  Administered 2023-08-29: 40 mg via ORAL
  Filled 2023-08-28: qty 2

## 2023-08-28 MED ORDER — ACETAMINOPHEN 650 MG RE SUPP: 650.0000 mg | Freq: Four times a day (QID) | RECTAL | Status: DC | PRN

## 2023-08-28 MED ORDER — FAMOTIDINE 20 MG PO TABS: 40.0000 mg | ORAL_TABLET | Freq: Once | ORAL | Status: DC | PRN

## 2023-08-28 MED ORDER — SODIUM ZIRCONIUM CYCLOSILICATE 10 G PO PACK
10.0000 g | PACK | Freq: Two times a day (BID) | ORAL | Status: AC
  Administered 2023-08-28 (×2): 10 g via ORAL
  Filled 2023-08-28 (×2): qty 1

## 2023-08-28 MED ORDER — CEFAZOLIN SODIUM-DEXTROSE 2-4 GM/100ML-% IV SOLN: 2.0000 g | INTRAVENOUS | Status: DC

## 2023-08-28 MED ORDER — ACETAMINOPHEN 325 MG PO TABS
650.0000 mg | ORAL_TABLET | Freq: Four times a day (QID) | ORAL | Status: DC | PRN
  Administered 2023-08-29: 650 mg via ORAL

## 2023-08-28 MED ORDER — GABAPENTIN 100 MG PO CAPS
200.0000 mg | ORAL_CAPSULE | Freq: Every day | ORAL | Status: DC
  Administered 2023-08-29: 200 mg via ORAL
  Filled 2023-08-28: qty 2

## 2023-08-28 MED ORDER — SODIUM CHLORIDE 0.9% FLUSH: 3.0000 mL | INTRAVENOUS | Status: DC | PRN

## 2023-08-28 MED ORDER — CHLORHEXIDINE GLUCONATE CLOTH 2 % EX PADS: 6.0000 | MEDICATED_PAD | Freq: Every day | CUTANEOUS | Status: DC

## 2023-08-28 MED ORDER — PANTOPRAZOLE SODIUM 40 MG PO TBEC: 40.0000 mg | DELAYED_RELEASE_TABLET | ORAL | Status: DC

## 2023-08-28 MED ORDER — CALCITRIOL 0.25 MCG PO CAPS
0.2500 ug | ORAL_CAPSULE | Freq: Every day | ORAL | Status: DC
  Administered 2023-08-28: 0.25 ug via ORAL
  Filled 2023-08-28: qty 1

## 2023-08-28 MED ORDER — HYDROMORPHONE HCL 1 MG/ML IJ SOLN: 1.0000 mg | Freq: Once | INTRAMUSCULAR | Status: DC | PRN

## 2023-08-28 MED ORDER — CEFAZOLIN SODIUM-DEXTROSE 1-4 GM/50ML-% IV SOLN: 1.0000 g | INTRAVENOUS | Status: DC

## 2023-08-28 MED ORDER — MIDAZOLAM HCL 2 MG/ML PO SYRP: 8.0000 mg | ORAL_SOLUTION | Freq: Once | ORAL | Status: DC | PRN

## 2023-08-28 MED ORDER — SODIUM CHLORIDE 0.9 % IV SOLN: INTRAVENOUS | Status: DC

## 2023-08-28 MED ORDER — METOPROLOL SUCCINATE ER 50 MG PO TB24
50.0000 mg | ORAL_TABLET | Freq: Every day | ORAL | Status: DC
  Administered 2023-08-29: 50 mg via ORAL
  Filled 2023-08-28: qty 1

## 2023-08-28 MED ORDER — TORSEMIDE 20 MG PO TABS
20.0000 mg | ORAL_TABLET | Freq: Every day | ORAL | Status: DC
  Administered 2023-08-29: 20 mg via ORAL
  Filled 2023-08-28: qty 1

## 2023-08-28 MED ORDER — HEPARIN SODIUM (PORCINE) 5000 UNIT/ML IJ SOLN
5000.0000 [IU] | Freq: Three times a day (TID) | INTRAMUSCULAR | Status: DC
  Administered 2023-08-28 – 2023-08-29 (×2): 5000 [IU] via SUBCUTANEOUS
  Filled 2023-08-28 (×2): qty 1

## 2023-08-28 MED ORDER — ONDANSETRON HCL 4 MG/2ML IJ SOLN: 4.0000 mg | Freq: Four times a day (QID) | INTRAMUSCULAR | Status: DC | PRN

## 2023-08-28 MED ORDER — BUDESON-GLYCOPYRROL-FORMOTEROL 160-9-4.8 MCG/ACT IN AERO
2.0000 | INHALATION_SPRAY | Freq: Two times a day (BID) | RESPIRATORY_TRACT | Status: DC
  Administered 2023-08-28: 2 via RESPIRATORY_TRACT
  Filled 2023-08-28: qty 5.9

## 2023-08-28 MED ORDER — EZETIMIBE 10 MG PO TABS
10.0000 mg | ORAL_TABLET | Freq: Every day | ORAL | Status: DC
  Administered 2023-08-28: 10 mg via ORAL
  Filled 2023-08-28: qty 1

## 2023-08-28 MED ORDER — SPIRONOLACTONE 25 MG PO TABS
25.0000 mg | ORAL_TABLET | Freq: Every day | ORAL | Status: DC
  Administered 2023-08-29: 25 mg via ORAL
  Filled 2023-08-28: qty 1

## 2023-08-28 MED ORDER — SODIUM CHLORIDE 0.9% FLUSH
3.0000 mL | Freq: Two times a day (BID) | INTRAVENOUS | Status: DC
  Administered 2023-08-28: 3 mL via INTRAVENOUS

## 2023-08-28 MED ORDER — METHYLPREDNISOLONE SODIUM SUCC 125 MG IJ SOLR: 125.0000 mg | Freq: Once | INTRAMUSCULAR | Status: DC | PRN

## 2023-08-28 MED ORDER — DIPHENHYDRAMINE HCL 50 MG/ML IJ SOLN: 50.0000 mg | Freq: Once | INTRAMUSCULAR | Status: DC | PRN

## 2023-08-28 MED ORDER — SODIUM CHLORIDE 0.9% FLUSH
3.0000 mL | Freq: Two times a day (BID) | INTRAVENOUS | Status: DC
  Administered 2023-08-29: 3 mL via INTRAVENOUS

## 2023-08-28 MED ORDER — CEFAZOLIN SODIUM-DEXTROSE 1-4 GM/50ML-% IV SOLN
INTRAVENOUS | Status: AC
  Filled 2023-08-28: qty 50

## 2023-08-28 MED ORDER — FEBUXOSTAT 40 MG PO TABS
40.0000 mg | ORAL_TABLET | Freq: Every day | ORAL | Status: DC
  Administered 2023-08-28: 40 mg via ORAL
  Filled 2023-08-28: qty 1

## 2023-08-28 MED ORDER — ALBUTEROL SULFATE (2.5 MG/3ML) 0.083% IN NEBU: 3.0000 mL | INHALATION_SOLUTION | Freq: Four times a day (QID) | RESPIRATORY_TRACT | Status: DC | PRN

## 2023-08-28 MED ORDER — GABAPENTIN 400 MG PO CAPS
400.0000 mg | ORAL_CAPSULE | Freq: Every day | ORAL | Status: DC
  Administered 2023-08-28: 400 mg via ORAL
  Filled 2023-08-28: qty 1

## 2023-08-28 MED ORDER — POLYETHYLENE GLYCOL 3350 17 G PO PACK: 17.0000 g | PACK | Freq: Every day | ORAL | Status: DC | PRN

## 2023-08-28 MED ORDER — BUSPIRONE HCL 10 MG PO TABS
5.0000 mg | ORAL_TABLET | Freq: Every day | ORAL | Status: DC
  Administered 2023-08-28: 5 mg via ORAL
  Filled 2023-08-28: qty 1

## 2023-08-28 NOTE — Assessment & Plan Note (Signed)
 Diet-controlled type 2 diabetes.  No indication for SSI at this time

## 2023-08-28 NOTE — Assessment & Plan Note (Signed)
 Today's potassium 4.8.  Patient able to have a PermCath and dialysis today.

## 2023-08-28 NOTE — Plan of Care (Signed)

## 2023-08-28 NOTE — Assessment & Plan Note (Deleted)
 History of ESRD with plans to initiate HD, however will likely need dialysis sooner than later given hyperkalemia.  - Nephrology and vascular surgery consulted; appreciate their recommendations - N.p.o. after midnight for permacath placement

## 2023-08-28 NOTE — Progress Notes (Signed)
 [facility]  ROUNDING NOTE   Subjective:   [name] [name]  is a 78 year old male with past medical conditions including CAD, basal cell carcinoma, arthritis, COPD, gout, CHF, hyperlipidemia, hypertension, and chronic kidney disease stage IV.  Patient seen in special recovery, scheduled for PermCath placement but found to have potassium 5.7.  Patient currently admitted under observation for End stage renal disease (HCC) [N18.6] Hyperkalemia [E87.5]  Patient is known to our practice and is followed by Dr. Zelda Hickman.  Patient recently began receiving outpatient dialysis treatments.  Went to receive first treatment and staff was unable to cannulate fistula.  Patient sent for PermCath placement.  Preliminary labs show potassium 5.7.  Patient currently awaiting vascular surgery to place access.  Patient appears in no distress.  Resting on stretcher, daughter and wife at bedside.  Right upper arm is bruised from cannulation attempt however remains patent with bruit and thrill.  Patient is on room air, trace lower extremity edema.  Preliminary potassium 5.7.  Confirm with vascular that due to this, no intervention was planned for today.  Patient prescribed Lokelma .    We have been consulted to evaluate dialysis access and provide hemodialysis, if needed.  Objective:  Vital signs in last 24 hours:  Temp:  [97.7 F (36.5 C)-98 F (36.7 C)] 97.7 F (36.5 C) (05/06 1726) Pulse Rate:  [62-65] 65 (05/06 1726) Resp:  [18-27] 21 (05/06 1726) BP: (111-128)/(46-100) 125/85 (05/06 1726) SpO2:  [97 %-100 %] 100 % (05/06 1726) Weight:  [88 kg] 88 kg (05/06 1237)  Weight change:  Filed Weights   08/28/23 1237  Weight: 88 kg    Intake/Output: I/O last 3 completed shifts: In: 7 [I.V.:7] Out: 250 [Urine:250]   Intake/Output this shift:  No intake/output data recorded.  Physical Exam: General: NAD  Head: Normocephalic, atraumatic. Moist oral mucosal membranes  Eyes: Anicteric   Lungs:  Clear to auscultation, normal effort  Heart: Regular rate and rhythm  Abdomen:  Soft, nontender,   Extremities: Trace peripheral edema.  Neurologic: Nonfocal, moving all four extremities  Skin: No lesions  Access: Left upper aVF (bruising)    Basic Metabolic Panel: Recent Labs  Lab 08/28/23 1738  NA 141  K 5.2*  CL 108  CO2 22  GLUCOSE 100*  BUN 68*  CREATININE 4.78*  CALCIUM 8.8*    Liver Function Tests: No results for input(s): "AST", "ALT", "ALKPHOS", "BILITOT", "PROT", "ALBUMIN" in the last 168 hours. No results for input(s): "LIPASE", "AMYLASE" in the last 168 hours. No results for input(s): "AMMONIA" in the last 168 hours.  CBC: No results for input(s): "WBC", "NEUTROABS", "HGB", "HCT", "MCV", "PLT" in the last 168 hours.  Cardiac Enzymes: No results for input(s): "CKTOTAL", "CKMB", "CKMBINDEX", "TROPONINI" in the last 168 hours.  BNP: Invalid input(s): "POCBNP"  CBG: No results for input(s): "GLUCAP" in the last 168 hours.  Microbiology: Results for orders placed or performed during the hospital encounter of 06/04/23  Culture, blood (Routine X 2) w Reflex to ID Panel     Status: None   Collection Time: 06/04/23  9:21 AM   Specimen: BLOOD  Result Value Ref Range Status   Specimen Description BLOOD BLOOD LEFT ARM  Final   Special Requests   Final    BOTTLES DRAWN AEROBIC AND ANAEROBIC Blood Culture adequate volume   Culture   Final    NO GROWTH 5 DAYS Performed at [facility], [address], [place], [place] [address]    Report Status 06/09/2023  FINAL  Final  Culture, blood (Routine X 2) w Reflex to ID Panel     Status: None   Collection Time: 06/04/23  9:21 AM   Specimen: BLOOD  Result Value Ref Range Status   Specimen Description BLOOD BLOOD RIGHT HAND  Final   Special Requests   Final    BOTTLES DRAWN AEROBIC ONLY Blood Culture results may not be optimal due to an inadequate volume of blood received in culture bottles    Culture   Final    NO GROWTH 5 DAYS Performed at [facility], [address] [place], [place] [address]    Report Status 06/09/2023 FINAL  Final    Coagulation Studies: No results for input(s): "LABPROT", "INR" in the last 72 hours.  Urinalysis: No results for input(s): "COLORURINE", "LABSPEC", "PHURINE", "GLUCOSEU", "HGBUR", "BILIRUBINUR", "KETONESUR", "PROTEINUR", "UROBILINOGEN", "NITRITE", "LEUKOCYTESUR" in the last 72 hours.  Invalid input(s): "APPERANCEUR"    Imaging: No results found.   Medications:     heparin   5,000 Units Subcutaneous Q8H   sodium chloride  flush  3 mL Intravenous Q12H   sodium chloride  flush  3-10 mL Intravenous Q12H   sodium zirconium cyclosilicate   10 g Oral BID   acetaminophen  **OR** acetaminophen , HYDROmorphone (DILAUDID) injection, ondansetron  (ZOFRAN ) IV, polyethylene glycol, sodium chloride  flush  Assessment/ Plan:  [name] is a 78 y.o.  male with past medical conditions including CAD, basal cell carcinoma, arthritis, COPD, gout, CHF, hyperlipidemia, hypertension, and chronic kidney disease stage IV.  Patient presented to vascular surgery for placement of PermCath but was found to have elevated potassium.  Patient admitted under observation for End stage renal disease (HCC) [N18.6] Hyperkalemia [E87.5]   Hyperkalemia with end-stage renal disease on hemodialysis.  Potassium 5.7, will prescribe Lokelma  10 g x 2 doses.  Will assess potassium level in AM.  2.  Malfunctioning dialysis access, outpatient clinic unable to cannulate.  Vascular surgery plans on tunnel catheter in AM.    LOS: 0 [name] [name] 5/6/20258:11 PM

## 2023-08-28 NOTE — Progress Notes (Signed)
 Procedure cancelled for today. Lokelma  p.o. ordered for high K+ level.

## 2023-08-28 NOTE — Progress Notes (Addendum)
 Pt. Family asking multiple questions re: pt. Right arm fistula. S.Breeze, NP in at bedside now to assess pt. Fisula. . K+ level 5.7 today. MD made aware. Right upper arm ecchymotic 4 in. X 3.5 in. (Measured).

## 2023-08-28 NOTE — Assessment & Plan Note (Signed)
 Patient states he experiences dyspnea on exertion and shortness of breath but this is unchanged and chronic.  - Continue home bronchodilators - DuoNebs as needed

## 2023-08-28 NOTE — Progress Notes (Signed)
 Patient ordered on CPAP at night. Patient stated "will be ok without one until his family brings his home machine in tomorrow"

## 2023-08-28 NOTE — Progress Notes (Signed)
 Hemodialysis consent signed and in chart. Pt aware of HD after permacath placement tomorrow.

## 2023-08-28 NOTE — H&P (Signed)
 History and Physical    Patient: [name] [name] ZOX:[id number] DOB: 10/10/1945 DOA: 08/28/2023 DOS: the patient was seen and examined on 08/28/2023 PCP: Trenda Frisk, FNP  Patient coming from: Home  Chief Complaint: No chief complaint on file.  HPI: [name] [name] is a 78 y.o. 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  TRH consulted for hyperkalemia.  [name] [name] states he was in his usual state of health earlier today when he came to the hospital for permacath placement.  For unknown reason, his right AV fistula was accessed with bruising and he is quite upset about this.  Otherwise, he states that he feels at his baseline.  He denies any chest pain, palpitations.  He endorses chronic unchanged shortness of breath.  Hospital course: On arrival to the hospital, patient was hypertensive at 111/100 with heart rate of 62.  He was saturating at 98% on home 2 L.  He was afebrile at 98.  Initial workup notable for potassium of 5.7.  TRH contacted for admission.  Nephrology consulted.   Review of Systems: As mentioned in the history of present illness. All other systems reviewed and are negative.  Past Medical History:  Diagnosis Date   Accidental cut, puncture, perforation, or hemorrhage during heart catheterization 12/27/2012   Acute kidney injury superimposed on stage 4 chronic kidney disease (HCC) 07/27/2020   Acute on chronic combined systolic and diastolic CHF (congestive heart failure) (HCC) 04/25/2023   Acute on chronic respiratory failure with hypoxia (HCC) 04/22/2023   Adopted    AKI (acute kidney injury) (HCC) 02/05/2023   ANA positive 07/12/2015   Aortic atherosclerosis (HCC)    Aortic stenosis, severe     a.) s/p TAVR on 08/01/2016: 29 mm CoreValve Evolut placed   Arthritis    Bacteremia due to Streptococcus 04/24/2023   Basal cell carcinoma 01/04/2010   Right sup. post. helix. Excised 03/02/2010   BCC (basal cell carcinoma of skin) 11/24/2020   R neck below the ear, EDC   BPH (benign prostatic hyperplasia)    CAD (coronary artery disease) 12/26/2012   a.) LHC 12/26/2012: 50% mLAD, 80% mLCx, 100% pRCA --> CVTS at [facility] --> decided again revascularization due to insig LAD disease and the fact that RCA lesion was chronic   CAP (community acquired pneumonia) 07/26/2020   Chronic respiratory failure with hypoxia (HCC)    Claudication of both lower extremities (HCC)    Complete tear of right rotator cuff 11/15/2015   Complex tear of lateral meniscus of left knee as current injury 12/06/2020   Complex tear of medial meniscus of left knee as current injury 10/18/2020   COPD (chronic obstructive pulmonary disease) (HCC)    Diarrhea 02/04/2023   Diet-controlled type 2 diabetes mellitus (HCC)    Diverticulosis    Dyspnea    Electrolyte abnormality 02/04/2023   ESRD (end stage renal disease) (HCC)    GERD (gastroesophageal reflux disease)    Gout    Heart murmur    HFrEF (heart failure with reduced ejection fraction) (HCC)    a.) s/p TAVR on 08/01/2016: 29 mm CoreValve Evolut placed   History of bilateral cataract extraction    History of hiatal hernia    HLD (hyperlipidemia)    Hyperkalemia 07/30/2020  Hypertension    Hypomagnesemia 02/05/2023   Hypothyroidism    IDA (iron deficiency anemia)    Migraines    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   a.) TTE 04/24/2023: moderate (MPG 7.5 mmHG)   OSA on CPAP    Pneumonia 07/2020   Pulmonary hypertension (HCC)    a.) TTE 08/17/2022: "mild pHTN" per echo report; b.) TTE 04/24/2023: RVSP 39.1   Recurrent nephrolithiasis 12/27/2012   Rotator cuff tendinitis, right 11/15/2015   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   a.) s/p TAVR  on 08/01/2016: 29 mm CoreValve Evolut placed   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 MOHS.   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/[place])    Past Surgical History:  Procedure Laterality Date   AORTIC VALVE REPLACEMENT N/A 08/01/2016   29 mm CoreValve Evolut; Location: [facility]; Surgeon: Forbes Ida, MD   AV FISTULA PLACEMENT Right 06/27/2023   Procedure: ARTERIOVENOUS (AV) FISTULA CREATION (BRACHIALCEPHALIC);  Surgeon: Jackquelyn Mass, MD;  Location: [facility];  Service: Vascular;  Laterality: Right;   CARDIAC CATHETERIZATION N/A 12/26/2012   2v CAD; retained pigtail in myocardium --> transferred to [facility]; Location: [facility]; Surgeon: Rinda Cheers, MD   CATARACT EXTRACTION W/PHACO Left 10/03/2022   Procedure: CATARACT EXTRACTION PHACO AND INTRAOCULAR LENS PLACEMENT (IOC) LEFT DIABETIC 8.22 00:55.5;  Surgeon: Clair Crews, MD;  Location: [facility];  Service: Ophthalmology;  Laterality: Left;  sleep apnea   CATARACT EXTRACTION W/PHACO Right 10/17/2022   Procedure: CATARACT EXTRACTION PHACO AND INTRAOCULAR LENS PLACEMENT (IOC) RIGHT DIABETIC;  Surgeon: Clair Crews, MD;  Location: [facility];  Service: Ophthalmology;  Laterality: Right;  4.79 0:37.4   COLONOSCOPY     COLONOSCOPY N/A 04/28/2023   Procedure: COLONOSCOPY;  Surgeon: Toledo, Alphonsus Jeans, MD;  Location: [facility];  Service: Gastroenterology;  Laterality: N/A;   KNEE ARTHROSCOPY WITH MEDIAL MENISECTOMY Left 12/02/2020   Procedure: KNEE ARTHROSCOPY WITH DEBRIDEMENT AND PARTIAL MEDIAL AND LATERAL MENISECTOMY;  Surgeon: Elner Hahn, MD;  Location: [facility];  Service:  Orthopedics;  Laterality: Left;   LITHOTRIPSY     PERCUTANEOUS REMOVAL INTRA-AORTIC BALLOON CATH N/A 12/26/2012   Procedure: PERCUTANEOUS REMOVAL INTRA-AORTIC BALLOON CATH; Surgeon: Erik Havens, MD; Location: [facility]; Service: Cardiothoracic   POLYPECTOMY  04/28/2023   Procedure: POLYPECTOMY;  Surgeon: Corky Diener, Alphonsus Jeans, MD;  Location: [facility];  Service: Gastroenterology;;   RIGHT HEART CATH Right 07/13/2016   Location: [facility]; Surgeon: Toula French, MD   TEE WITHOUT CARDIOVERSION Right 04/26/2023   Procedure: TRANSESOPHAGEAL ECHOCARDIOGRAM (TEE);  Surgeon: Cherrie Cornwall, MD;  Location: [facility];  Service: Cardiovascular;  Laterality: Right;   TRANSESOPHAGEAL ECHOCARDIOGRAM N/A 12/26/2012   Procedure: TRANSESOPHAGEAL ECHOCARDIOGRAPHY; Surgeon: Erik Havens, MD; Location: [facility]; Service: Cardiothoracic   UMBILICAL HERNIA REPAIR N/A 02/13/2014   Procedure: LAPAROSCOPIC UMBILICAL HERNIA REPAIR; Surgeon: Oma Bias, MD; Location: [facility]; Service: General Surgery   Social History:  reports that he quit smoking about 15 years ago. His smoking use included cigarettes. He started smoking about 69 years ago. He has a 108 pack-year smoking history. He quit smokeless tobacco use about 19  years ago.  His smokeless tobacco use included chew. He reports current alcohol use. He reports that he does not use drugs.  Allergies  Allergen Reactions   Nsaids Other (See Comments)    Avoid due to kidney disease    Statins Other (See Comments)    Myalgias   Nexletol [Bempedoic Acid] Diarrhea    fatigue   Silver Other (See Comments)   Tegaderm High Gelling Alginate [Wound Dressings] Rash    Family History  Adopted: Yes  Problem Relation Age of Onset   Varicose Veins Daughter    Obesity Daughter    Hyperlipidemia Daughter    Diabetes Daughter    COPD Daughter    Depression Daughter    Asthma Daughter    Arthritis Son    Diabetes Son     Hypertension Son     Prior to Admission medications   Medication Sig Start Date End Date Taking? Authorizing Provider  acetaminophen  (TYLENOL ) 650 MG CR tablet Take 1,300 mg by mouth every 8 (eight) hours as needed for pain.   Yes [provider]  Ascorbic Acid  (VITAMIN C ) 1000 MG tablet Take 1,000 mg by mouth at bedtime.   Yes [provider]  busPIRone  (BUSPAR ) 5 MG tablet TAKE 1 TABLET BY MOUTH TWICE DAILY AS NEEDED Patient taking differently: Take 5 mg by mouth at bedtime. 03/27/23  Yes Trenda Frisk, FNP  calcitRIOL  (ROCALTROL ) 0.25 MCG capsule Take 0.25 mcg by mouth daily.   Yes [provider]  citalopram  (CELEXA ) 40 MG tablet TAKE 1 TABLET EVERY DAY Patient taking differently: Take 40 mg by mouth every morning. 08/31/22  Yes Trenda Frisk, FNP  colchicine  0.6 MG tablet Take 0.3 mg by mouth at bedtime. Taking as needed only 04/12/23 07/18/24 Yes [provider]  ezetimibe  (ZETIA ) 10 MG tablet Take 1 tablet (10 mg total) by mouth at bedtime. 08/03/23  Yes Trenda Frisk, FNP  febuxostat  (ULORIC ) 40 MG tablet Take 40 mg by mouth at bedtime. 10/09/22  Yes [provider]  fexofenadine (ALLEGRA) 180 MG tablet Take 180 mg by mouth daily as needed for allergies or rhinitis.   Yes [provider]  Fluticasone -Umeclidin-Vilant (TRELEGY ELLIPTA ) 100-62.5-25 MCG/ACT AEPB Inhale 1 Inhalation into the lungs daily at 6 (six) AM. Patient taking differently: Inhale 1 Inhalation into the lungs daily as needed (shortness of breath). 10/12/22  Yes Trenda Frisk, FNP  gabapentin  (NEURONTIN ) 100 MG capsule TAKE 2 CAPSULES TWICE DAILY 04/10/23  Yes Shirley, Amanda M, FNP  gabapentin  (NEURONTIN ) 400 MG capsule TAKE 1 CAPSULE AT BEDTIME 08/22/22  Yes Trenda Frisk, FNP  metoprolol  succinate (TOPROL -XL) 50 MG 24 hr tablet TAKE 1 TABLET EVERY DAY Patient taking differently: Take 50 mg by mouth every morning. 03/24/23  Yes Trenda Frisk, FNP   Multiple Vitamins-Minerals (PRESERVISION AREDS) CAPS Take 1 capsule by mouth 2 (two) times daily.   Yes [provider]  OXYGEN  Inhale 2-3 L into the lungs See admin instructions. Used as needed throughout the day and continuous at bedtime   Yes [provider]  pantoprazole  (PROTONIX ) 40 MG tablet Take 1 tablet (40 mg total) by mouth every morning. 08/14/23  Yes Trenda Frisk, FNP  spironolactone (ALDACTONE) 25 MG tablet Take 25 mg by mouth daily.   Yes [provider]  SUPER B COMPLEX/C PO Take 1 tablet by mouth daily.   Yes [provider]  torsemide  (DEMADEX ) 20 MG tablet Take 20 mg by mouth daily.  02/26/23  Yes [provider]  albuterol  (VENTOLIN  HFA) 108 (90 Base) MCG/ACT inhaler Inhale 2 puffs into the lungs every 6 (six) hours as needed for wheezing or shortness of breath. 07/08/14   [provider]  Alcohol Swabs (DROPSAFE ALCOHOL PREP) 70 % PADS Apply 1 each topically as needed (With repatha and blood sugar checks.). 01/19/22   [provider]  Evolocumab 140 MG/ML SOAJ Inject 140 mg into the skin every 14 (fourteen) days. 04/30/23   [provider]  HYDROcodone -acetaminophen  (NORCO/VICODIN) 5-325 MG tablet Take 1 tablet by mouth every 6 (six) hours as needed for moderate pain (pain score 4-6) or severe pain (pain score 7-10). Patient not taking: Reported on 08/28/2023 06/27/23   Schnier, Ninette Basque, MD  ondansetron  (ZOFRAN -ODT) 4 MG disintegrating tablet Take 1 tablet (4 mg total) by mouth every 8 (eight) hours as needed for nausea or vomiting. Patient not taking: Reported on 08/28/2023 03/23/23   Jacquie Maudlin, MD  Simethicone  (GAS-X PO) Take 1 tablet by mouth 2 (two) times daily as needed (flatulence). Patient not taking: Reported on 08/28/2023    [provider]  sodium chloride  (OCEAN) 0.65 % SOLN nasal spray Place 1 spray into both nostrils as needed for congestion. Patient not taking: Reported on 08/28/2023  07/11/16   Domingo Friend, NP    Physical Exam: Vitals:   08/28/23 1237 08/28/23 1244  BP: (!) 111/100   Pulse:  62  Resp: 18 (!) 27  Temp: 98 F (36.7 C)   TempSrc: Oral   SpO2: 97% 98%  Weight: 88 kg   Height: 5\' 5"  (1.651 m)    Physical Exam Vitals and nursing note reviewed.  Constitutional:      General: He is not in acute distress.    Appearance: He is obese. He is not toxic-appearing.  HENT:     Head: Normocephalic and atraumatic.  Eyes:     Conjunctiva/sclera: Conjunctivae normal.     Pupils: Pupils are equal, round, and reactive to light.  Cardiovascular:     Rate and Rhythm: Normal rate and regular rhythm.     Heart sounds: No murmur heard. Pulmonary:     Effort: Pulmonary effort is normal.     Breath sounds: Decreased breath sounds (Diminished throughout) present. No wheezing, rhonchi or rales.  Abdominal:     General: Bowel sounds are normal.     Palpations: Abdomen is soft.  Musculoskeletal:     Right lower leg: No edema.     Left lower leg: No edema.  Skin:    General: Skin is warm and dry.     Findings: Bruising (Right upper arm) present.  Neurological:     Mental Status: He is alert and oriented to person, place, and time. Mental status is at baseline.  Psychiatric:        Mood and Affect: Mood normal.        Behavior: Behavior normal.    Data Reviewed: Potassium 5.7.  BMP and EKG pending  Results are pending, will review when available.  Assessment and Plan:  * Hyperkalemia Patient presented for permacath placement was noted to have mild hyperkalemia at 5.7.  He is asymptomatic.  - Lokelma  twice daily for 2 doses - Repeat BMP in the a.m. - EKG ordered  Chronic kidney disease (CKD), stage V (HCC) History of ESRD with plans to initiate HD, however will likely need dialysis sooner than later given hyperkalemia.  - Nephrology and vascular surgery consulted; appreciate their recommendations -  N.p.o. after midnight for permacath  placement  Type 2 diabetes mellitus with chronic kidney disease, without long-term current use of insulin  (HCC) Diet controlled type 2 diabetes.  No indication for SSI at this time  HFrEF (heart failure with reduced ejection fraction) (HCC) History of HFrEF with EF as low as 40-45% when last checked in January 2025.  He appears euvolemic at this time.  - Continue home regimen  OSA on CPAP - CPAP at bedtime  Essential hypertension - Resume home regimen  Chronic respiratory failure with hypoxia (HCC) - Continue home 2 L of supplemental oxygen   COPD (chronic obstructive pulmonary disease) (HCC) Patient states he experiences dyspnea on exertion and shortness of breath but this is unchanged and chronic.  - Continue home bronchodilators - DuoNebs as needed  Advance Care Planning:   Code Status: Full Code   Consults: Vascular surgery, Nephrology  Family Communication: No family at bedside.   Severity of Illness: The appropriate patient status for this patient is OBSERVATION. Observation status is judged to be reasonable and necessary in order to provide the required intensity of service to ensure the patient's safety. The patient's presenting symptoms, physical exam findings, and initial radiographic and laboratory data in the context of their medical condition is felt to place them at decreased risk for further clinical deterioration. Furthermore, it is anticipated that the patient will be medically stable for discharge from the hospital within 2 midnights of admission.   Author: Avi Body, MD 08/28/2023 3:56 PM  For on call review [email or web address].

## 2023-08-28 NOTE — Assessment & Plan Note (Signed)
-   Resume home regimen

## 2023-08-28 NOTE — Assessment & Plan Note (Signed)
 History of HFrEF with EF as low as 40-45% when last checked in January 2025.  He appears euvolemic at this time.  - Continue home regimen.  Dialysis should help out with fluid removal.

## 2023-08-28 NOTE — Assessment & Plan Note (Signed)
-   Continue home 2 L of supplemental oxygen

## 2023-08-28 NOTE — Assessment & Plan Note (Signed)
-   CPAP at bedtime

## 2023-08-29 ENCOUNTER — Encounter: Payer: Self-pay | Admitting: Internal Medicine

## 2023-08-29 ENCOUNTER — Encounter
Admission: RE | Disposition: A | Discharge status: Home / Self Care | Payer: Self-pay | Source: Home / Self Care | Attending: Internal Medicine

## 2023-08-29 ENCOUNTER — Ambulatory Visit: Payer: Medicare HMO | Admitting: Dermatology

## 2023-08-29 DIAGNOSIS — J9611 Chronic respiratory failure with hypoxia: Secondary | ICD-10-CM

## 2023-08-29 DIAGNOSIS — N186 End stage renal disease: Principal | ICD-10-CM

## 2023-08-29 DIAGNOSIS — E875 Hyperkalemia: Secondary | ICD-10-CM | POA: Diagnosis not present

## 2023-08-29 DIAGNOSIS — Z992 Dependence on renal dialysis: Secondary | ICD-10-CM | POA: Diagnosis not present

## 2023-08-29 DIAGNOSIS — I5020 Unspecified systolic (congestive) heart failure: Secondary | ICD-10-CM | POA: Diagnosis not present

## 2023-08-29 DIAGNOSIS — G4733 Obstructive sleep apnea (adult) (pediatric): Secondary | ICD-10-CM | POA: Diagnosis not present

## 2023-08-29 DIAGNOSIS — E669 Obesity, unspecified: Secondary | ICD-10-CM | POA: Insufficient documentation

## 2023-08-29 DIAGNOSIS — J439 Emphysema, unspecified: Secondary | ICD-10-CM | POA: Diagnosis not present

## 2023-08-29 DIAGNOSIS — E1122 Type 2 diabetes mellitus with diabetic chronic kidney disease: Secondary | ICD-10-CM

## 2023-08-29 DIAGNOSIS — I10 Essential (primary) hypertension: Secondary | ICD-10-CM

## 2023-08-29 HISTORY — PX: DIALYSIS/PERMA CATHETER INSERTION: CATH118288

## 2023-08-29 LAB — CBC
HCT: 34 % — ABNORMAL LOW (ref 39.0–52.0)
Hemoglobin: 11.1 g/dL — ABNORMAL LOW (ref 13.0–17.0)
MCH: 32.5 pg (ref 26.0–34.0)
MCHC: 32.6 g/dL (ref 30.0–36.0)
MCV: 99.4 fL (ref 80.0–100.0)
Platelets: 152 10*3/uL (ref 150–400)
RBC: 3.42 MIL/uL — ABNORMAL LOW (ref 4.22–5.81)
RDW: 13.5 % (ref 11.5–15.5)
WBC: 7.3 10*3/uL (ref 4.0–10.5)
nRBC: 0 % (ref 0.0–0.2)

## 2023-08-29 LAB — HEPATITIS B SURFACE ANTIGEN: Hepatitis B Surface Ag: NONREACTIVE

## 2023-08-29 LAB — BASIC METABOLIC PANEL WITH GFR
Anion gap: 12 (ref 5–15)
BUN: 70 mg/dL — ABNORMAL HIGH (ref 8–23)
CO2: 21 mmol/L — ABNORMAL LOW (ref 22–32)
Calcium: 8.7 mg/dL — ABNORMAL LOW (ref 8.9–10.3)
Chloride: 107 mmol/L (ref 98–111)
Creatinine, Ser: 4.71 mg/dL — ABNORMAL HIGH (ref 0.61–1.24)
GFR, Estimated: 12 mL/min — ABNORMAL LOW (ref >60)
Glucose, Bld: 92 mg/dL (ref 70–99)
Potassium: 4.8 mmol/L (ref 3.5–5.1)
Sodium: 140 mmol/L (ref 135–145)

## 2023-08-29 SURGERY — DIALYSIS/PERMA CATHETER INSERTION
Anesthesia: Moderate Sedation

## 2023-08-29 MED ORDER — CEFAZOLIN SODIUM-DEXTROSE 1-4 GM/50ML-% IV SOLN
INTRAVENOUS | Status: AC | PRN
  Administered 2023-08-29: 1 g via INTRAVENOUS

## 2023-08-29 MED ORDER — MIDAZOLAM HCL 2 MG/2ML IJ SOLN
INTRAMUSCULAR | Status: DC | PRN
  Administered 2023-08-29: 2 mg via INTRAVENOUS

## 2023-08-29 MED ORDER — FENTANYL CITRATE (PF) 100 MCG/2ML IJ SOLN
INTRAMUSCULAR | Status: DC | PRN
  Administered 2023-08-29: 50 ug via INTRAVENOUS

## 2023-08-29 MED ORDER — MIDAZOLAM HCL 2 MG/2ML IJ SOLN
INTRAMUSCULAR | Status: AC
Start: 2023-08-29 — End: ?
  Filled 2023-08-29: qty 2

## 2023-08-29 MED ORDER — HEPARIN (PORCINE) IN NACL 1000-0.9 UT/500ML-% IV SOLN
INTRAVENOUS | Status: DC | PRN
  Administered 2023-08-29: 500 mL

## 2023-08-29 MED ORDER — FENTANYL CITRATE PF 50 MCG/ML IJ SOSY
PREFILLED_SYRINGE | INTRAMUSCULAR | Status: AC
  Filled 2023-08-29: qty 1

## 2023-08-29 MED ORDER — HEPARIN SODIUM (PORCINE) 10000 UNIT/ML IJ SOLN
INTRAMUSCULAR | Status: DC | PRN
  Administered 2023-08-29: 10000 [IU]

## 2023-08-29 MED ORDER — ACETAMINOPHEN 325 MG PO TABS
ORAL_TABLET | ORAL | Status: AC
Start: 2023-08-29 — End: ?
  Filled 2023-08-29: qty 2

## 2023-08-29 MED ORDER — LIDOCAINE HCL (PF) 1 % IJ SOLN
INTRAMUSCULAR | Status: DC | PRN
  Administered 2023-08-29: 20 mL

## 2023-08-29 SURGICAL SUPPLY — 11 items
CATH CANNON HEMO 15FR 19 (HEMODIALYSIS SUPPLIES) IMPLANT
COVER PROBE ULTRASOUND 5X96 (MISCELLANEOUS) IMPLANT
DERMABOND ADVANCED .7 DNX12 (GAUZE/BANDAGES/DRESSINGS) IMPLANT
DRAPE INCISE IOBAN 66X45 STRL (DRAPES) IMPLANT
GOWN STRL REUS W/ TWL LRG LVL3 (GOWN DISPOSABLE) ×1 IMPLANT
NDL ENTRY 21GA 7CM ECHOTIP (NEEDLE) IMPLANT
NEEDLE ENTRY 21GA 7CM ECHOTIP (NEEDLE) ×1 IMPLANT
PACK ANGIOGRAPHY (CUSTOM PROCEDURE TRAY) ×1 IMPLANT
SET INTRO CAPELLA COAXIAL (SET/KITS/TRAYS/PACK) IMPLANT
SUT MNCRL AB 4-0 PS2 18 (SUTURE) IMPLANT
SUT SILK 0 FSL (SUTURE) IMPLANT

## 2023-08-29 NOTE — Progress Notes (Signed)
  Received patient in bed to unit.   Informed consent signed and in chart.    TX duration: 2hrs     Transported back to floor Hand-off given to patient's nurse.  4/10 shoulder pain  primary nurse aware   Access used: R HD Catheter Access issues: none   Total UF removed: none Medication(s) given: tylenol  Post HD VS: wnl Post HD weight: 85.0kg     Bettye Bruins LPN [facility]

## 2023-08-29 NOTE — H&P (View-Only) (Signed)
 MRN : [id number]  [name] [name] is a 78 y.o. ([birth year]) male who presents with chief complaint of check access.  History of Present Illness:   Patient presents to [facility] today for insertion of a permacath.  He is a 78 year old male with past medical conditions including CAD, basal cell carcinoma, arthritis, COPD, gout, CHF, hyperlipidemia, hypertension, and chronic kidney disease stage IV.  Patient seen in special recovery, scheduled for PermCath placement but found to have potassium 5.7.   Dx: End stage renal disease (HCC) [N18.6] Hyperkalemia [E87.5]   Patient is known to our practice and is followed by Dr. Zelda Hickman.  Patient recently began receiving outpatient dialysis treatments.  Went to receive first treatment and staff was unable to cannulate fistula.  Patient sent for PermCath placement.  Preliminary labs show potassium 5.7.  Patient currently awaiting vascular surgery to place access.  Patient appears in no distress.  Resting on stretcher, daughter and wife at bedside.  Right upper arm is bruised from cannulation attempt however remains patent with bruit and thrill.  Patient is on room air, trace lower extremity edema.   Recent potassium 5.7.  Confirm with vascular that due to this, no intervention was planned for today.  Patient prescribed Lokelma .    Current Meds  Medication Sig   acetaminophen  (TYLENOL ) 650 MG CR tablet Take 1,300 mg by mouth every 8 (eight) hours as needed for pain.   Ascorbic Acid  (VITAMIN C ) 1000 MG tablet Take 1,000 mg by mouth at bedtime.   busPIRone  (BUSPAR ) 5 MG tablet TAKE 1 TABLET BY MOUTH TWICE DAILY AS NEEDED (Patient taking differently: Take 5 mg by mouth at bedtime.)   calcitRIOL  (ROCALTROL ) 0.25 MCG capsule Take 0.25 mcg by mouth daily.   citalopram  (CELEXA ) 40 MG tablet TAKE 1 TABLET EVERY DAY (Patient taking differently: Take 40 mg by mouth every morning.)   colchicine  0.6 MG tablet  Take 0.3 mg by mouth at bedtime. Taking as needed only   ezetimibe  (ZETIA ) 10 MG tablet Take 1 tablet (10 mg total) by mouth at bedtime.   febuxostat  (ULORIC ) 40 MG tablet Take 40 mg by mouth at bedtime.   fexofenadine (ALLEGRA) 180 MG tablet Take 180 mg by mouth daily as needed for allergies or rhinitis.   Fluticasone -Umeclidin-Vilant (TRELEGY ELLIPTA ) 100-62.5-25 MCG/ACT AEPB Inhale 1 Inhalation into the lungs daily at 6 (six) AM. (Patient taking differently: Inhale 1 Inhalation into the lungs daily as needed (shortness of breath).)   gabapentin  (NEURONTIN ) 100 MG capsule TAKE 2 CAPSULES TWICE DAILY   gabapentin  (NEURONTIN ) 400 MG capsule TAKE 1 CAPSULE AT BEDTIME   metoprolol  succinate (TOPROL -XL) 50 MG 24 hr tablet TAKE 1 TABLET EVERY DAY (Patient taking differently: Take 50 mg by mouth every morning.)   Multiple Vitamins-Minerals (PRESERVISION AREDS) CAPS Take 1 capsule by mouth 2 (two) times daily.   OXYGEN  Inhale 2-3 L into the lungs See admin instructions. Used as needed throughout the day and continuous at bedtime   pantoprazole  (PROTONIX ) 40 MG tablet Take 1 tablet (40 mg total) by mouth every morning.   spironolactone (ALDACTONE) 25 MG tablet Take 25 mg by mouth daily.   SUPER B COMPLEX/C PO Take 1 tablet by mouth daily.   torsemide  (DEMADEX ) 20 MG tablet Take 20 mg by mouth daily.    Past Medical History:  Diagnosis Date   Accidental cut, puncture, perforation,  or hemorrhage during heart catheterization 12/27/2012   Acute kidney injury superimposed on stage 4 chronic kidney disease (HCC) 07/27/2020   Acute on chronic combined systolic and diastolic CHF (congestive heart failure) (HCC) 04/25/2023   Acute on chronic respiratory failure with hypoxia (HCC) 04/22/2023   Adopted    AKI (acute kidney injury) (HCC) 02/05/2023   ANA positive 07/12/2015   Aortic atherosclerosis (HCC)    Aortic stenosis, severe    a.) s/p TAVR on 08/01/2016: 29 mm CoreValve Evolut placed   Arthritis     Bacteremia due to Streptococcus 04/24/2023   Basal cell carcinoma 01/04/2010   Right sup. post. helix. Excised 03/02/2010   BCC (basal cell carcinoma of skin) 11/24/2020   R neck below the ear, EDC   BPH (benign prostatic hyperplasia)    CAD (coronary artery disease) 12/26/2012   a.) LHC 12/26/2012: 50% mLAD, 80% mLCx, 100% pRCA --> CVTS at [facility] --> decided again revascularization due to insig LAD disease and the fact that RCA lesion was chronic   CAP (community acquired pneumonia) 07/26/2020   Chronic respiratory failure with hypoxia (HCC)    Claudication of both lower extremities (HCC)    Complete tear of right rotator cuff 11/15/2015   Complex tear of lateral meniscus of left knee as current injury 12/06/2020   Complex tear of medial meniscus of left knee as current injury 10/18/2020   COPD (chronic obstructive pulmonary disease) (HCC)    Diarrhea 02/04/2023   Diet-controlled type 2 diabetes mellitus (HCC)    Diverticulosis    Dyspnea    Electrolyte abnormality 02/04/2023   ESRD (end stage renal disease) (HCC)    GERD (gastroesophageal reflux disease)    Gout    Heart murmur    HFrEF (heart failure with reduced ejection fraction) (HCC)    a.) s/p TAVR on 08/01/2016: 29 mm CoreValve Evolut placed   History of bilateral cataract extraction    History of hiatal hernia    HLD (hyperlipidemia)    Hyperkalemia 07/30/2020   Hypertension    Hypomagnesemia 02/05/2023   Hypothyroidism    IDA (iron deficiency anemia)    Migraines    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   a.) TTE 04/24/2023: moderate (MPG 7.5 mmHG)   OSA on CPAP    Pneumonia 07/2020   Pulmonary hypertension (HCC)    a.) TTE 08/17/2022: "mild pHTN" per echo report; b.) TTE 04/24/2023: RVSP 39.1   Recurrent nephrolithiasis 12/27/2012   Rotator cuff tendinitis, right 11/15/2015   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 MOHS.   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/[place])     Past Surgical History:  Procedure Laterality Date   AORTIC VALVE REPLACEMENT N/A 08/01/2016   29 mm CoreValve Evolut; Location: [facility]; Surgeon: Forbes Ida, MD   AV FISTULA PLACEMENT Right 06/27/2023   Procedure: ARTERIOVENOUS (AV) FISTULA CREATION (BRACHIALCEPHALIC);  Surgeon: Jackquelyn Mass, MD;  Location: [facility];  Service: Vascular;  Laterality: Right;   CARDIAC CATHETERIZATION N/A 12/26/2012   2v CAD; retained pigtail  in myocardium --> transferred to [facility]; Location: [facility]; Surgeon: Rinda Cheers, MD   CATARACT EXTRACTION W/PHACO Left 10/03/2022   Procedure: CATARACT EXTRACTION PHACO AND INTRAOCULAR LENS PLACEMENT (IOC) LEFT DIABETIC 8.22 00:55.5;  Surgeon: Clair Crews, MD;  Location: [name];  Service: Ophthalmology;  Laterality: Left;  sleep apnea   CATARACT EXTRACTION W/PHACO Right 10/17/2022   Procedure: CATARACT EXTRACTION PHACO AND INTRAOCULAR LENS PLACEMENT (IOC) RIGHT DIABETIC;  Surgeon: Clair Crews, MD;  Location: [facility];  Service: Ophthalmology;  Laterality: Right;  4.79 0:37.4   COLONOSCOPY     COLONOSCOPY N/A 04/28/2023   Procedure: COLONOSCOPY;  Surgeon: Toledo, Alphonsus Jeans, MD;  Location: [facility];  Service: Gastroenterology;  Laterality: N/A;   KNEE ARTHROSCOPY WITH MEDIAL MENISECTOMY Left 12/02/2020   Procedure: KNEE ARTHROSCOPY WITH DEBRIDEMENT AND PARTIAL MEDIAL AND LATERAL MENISECTOMY;  Surgeon: Elner Hahn, MD;  Location: [facility];  Service: Orthopedics;  Laterality: Left;   LITHOTRIPSY     PERCUTANEOUS  REMOVAL INTRA-AORTIC BALLOON CATH N/A 12/26/2012   Procedure: PERCUTANEOUS REMOVAL INTRA-AORTIC BALLOON CATH; Surgeon: Erik Havens, MD; Location: [facility]; Service: Cardiothoracic   POLYPECTOMY  04/28/2023   Procedure: POLYPECTOMY;  Surgeon: Corky Diener, Alphonsus Jeans, MD;  Location: [facility];  Service: Gastroenterology;;   RIGHT HEART CATH Right 07/13/2016   Location: [facility]; Surgeon: Toula French, MD   TEE WITHOUT CARDIOVERSION Right 04/26/2023   Procedure: TRANSESOPHAGEAL ECHOCARDIOGRAM (TEE);  Surgeon: Cherrie Cornwall, MD;  Location: [facility];  Service: Cardiovascular;  Laterality: Right;   TRANSESOPHAGEAL ECHOCARDIOGRAM N/A 12/26/2012   Procedure: TRANSESOPHAGEAL ECHOCARDIOGRAPHY; Surgeon: Erik Havens, MD; Location: [facility]; Service: Cardiothoracic   UMBILICAL HERNIA REPAIR N/A 02/13/2014   Procedure: LAPAROSCOPIC UMBILICAL HERNIA REPAIR; Surgeon: Oma Bias, MD; Location: [facility]; Service: General Surgery    Social History Social History   Tobacco Use   Smoking status: Former    Current packs/day: 0.00    Average packs/day: 2.0 packs/day for 54.0 years (108.0 ttl pk-yrs)    Types: Cigarettes    Start date: 77    Quit date: 2010    Years since quitting: 15.3   Smokeless tobacco: Former    Types: Chew    Quit date: 05/16/2004  Vaping Use   Vaping status: Never Used  Substance Use Topics   Alcohol use: Yes    Comment: rare beer   Drug use: Never    Family History Family History  Adopted: Yes  Problem Relation Age of Onset   Varicose Veins Daughter    Obesity Daughter    Hyperlipidemia Daughter    Diabetes Daughter    COPD Daughter    Depression Daughter    Asthma Daughter    Arthritis Son    Diabetes Son    Hypertension Son     Allergies  Allergen Reactions   Nsaids Other (See Comments)    Avoid due to kidney disease    Statins Other (See Comments)    Myalgias   Nexletol [Bempedoic Acid] Diarrhea    fatigue    Silver Other (See Comments)   Tegaderm High Gelling Alginate [Wound Dressings] Rash     REVIEW OF SYSTEMS (Negative unless checked)  Constitutional: [] Weight loss  [] Fever  [] Chills Cardiac: [] Chest pain   [] Chest pressure   [] Palpitations   [] Shortness of breath when laying flat   [] Shortness of breath with exertion. Vascular:  [] Pain in legs with walking   [] Pain in legs at rest  [] History of DVT   []   Phlebitis   [] Swelling in legs   [] Varicose veins   [] Non-healing ulcers Pulmonary:   [] Uses home oxygen    [] Productive cough   [] Hemoptysis   [] Wheeze  [] COPD   [] Asthma Neurologic:  [] Dizziness   [] Seizures   [] History of stroke   [] History of TIA  [] Aphasia   [] Vissual changes   [] Weakness or numbness in arm   [] Weakness or numbness in leg Musculoskeletal:   [] Joint swelling   [] Joint pain   [] Low back pain Hematologic:  [] Easy bruising  [] Easy bleeding   [] Hypercoagulable state   [] Anemic Gastrointestinal:  [] Diarrhea   [] Vomiting  [] Gastroesophageal reflux/heartburn   [] Difficulty swallowing. Genitourinary:  [x] Chronic kidney disease   [] Difficult urination  [] Frequent urination   [] Blood in urine Skin:  [] Rashes   [] Ulcers  Psychological:  [] History of anxiety   []  History of major depression.  Physical Examination  Vitals:   08/28/23 1648 08/28/23 1726 08/28/23 2111 08/29/23 0531  BP: (!) 128/46 125/85 (!) 155/58 (!) 158/59  Pulse:  65 67 68  Resp:  (!) 21 20 16   Temp:  97.7 F (36.5 C) 97.7 F (36.5 C) 97.9 F (36.6 C)  TempSrc:  Oral Oral   SpO2:  100% 97% 99%  Weight:      Height:       Body mass index is 32.28 kg/m. Gen: WD/WN, NAD Head: [place]/AT, No temporalis wasting.  Ear/Nose/Throat: Hearing grossly intact, nares w/o erythema or drainage Eyes: PER, EOMI, sclera nonicteric.  Neck: Supple, no gross masses or lesions.  No JVD.  Pulmonary:  Good air movement, no audible wheezing, no use of accessory muscles.  Cardiac: RRR, precordium non-hyperdynamic. Vascular:     Vessel Right Left  Radial Palpable Palpable  Brachial Palpable Palpable  Gastrointestinal: soft, non-distended. No guarding/no peritoneal signs.  Musculoskeletal: M/S 5/5 throughout.  No deformity.  Neurologic: CN 2-12 intact. Pain and light touch intact in extremities.  Symmetrical.  Speech is fluent. Motor exam as listed above. Psychiatric: Judgment intact, Mood & affect appropriate for pt's clinical situation. Dermatologic: No rashes or ulcers noted.  No changes consistent with cellulitis.   CBC Lab Results  Component Value Date   WBC 7.3 08/29/2023   HGB 11.1 (L) 08/29/2023   HCT 34.0 (L) 08/29/2023   MCV 99.4 08/29/2023   PLT 152 08/29/2023    BMET    Component Value Date/Time   NA 140 08/29/2023 0444   NA 142 03/06/2023 0942   NA 138 07/27/2014 1247   K 4.8 08/29/2023 0444   K 4.2 07/27/2014 1247   CL 107 08/29/2023 0444   CL 105 07/27/2014 1247   CO2 21 (L) 08/29/2023 0444   CO2 26 07/27/2014 1247   GLUCOSE 92 08/29/2023 0444   GLUCOSE 135 (H) 07/27/2014 1247   BUN 70 (H) 08/29/2023 0444   BUN 47 (H) 03/06/2023 0942   BUN 28 (H) 07/27/2014 1247   CREATININE 4.71 (H) 08/29/2023 0444   CREATININE 1.64 (H) 07/27/2014 1247   CALCIUM 8.7 (L) 08/29/2023 0444   CALCIUM 8.7 (L) 07/27/2014 1247   GFRNONAA 12 (L) 08/29/2023 0444   GFRNONAA 42 (L) 07/27/2014 1247   GFRAA 23 (L) 07/24/2016 1919   GFRAA 48 (L) 07/27/2014 1247   Estimated Creatinine Clearance: 13 mL/min (A) (by C-G formula based on SCr of 4.71 mg/dL (H)).  COAG Lab Results  Component Value Date   INR 1.1 04/21/2023   INR 1.0 07/27/2014    Radiology No results found.   Assessment/Plan  End stage renal disease: Recommend:  The patient requires dialysis access.  Patient should have a tunneled catheter.    The risks, benefits and alternative therapies were reviewed in detail with the patient.  All questions were answered.  The patient agrees to proceed with angio/intervention.    The patient  will follow up with me in the office after the procedure.    Devon Fogo, MD  08/29/2023 7:43 AM

## 2023-08-29 NOTE — Progress Notes (Signed)
 [facility]  ROUNDING NOTE   Subjective:   [name] [name]  is a 78 year old male with past medical conditions including CAD, basal cell carcinoma, arthritis, COPD, gout, CHF, hyperlipidemia, hypertension, and chronic kidney disease stage IV.  Patient seen in special recovery, scheduled for PermCath placement but found to have potassium 5.7.  Patient currently admitted under observation for End stage renal disease (HCC) [N18.6] Hyperkalemia [E87.5]  Patient is known to our practice and is followed by Dr. Zelda Hickman.  Patient recently began receiving outpatient dialysis treatments.  Went to receive first treatment and staff was unable to cannulate fistula.    Patient seen and evaluated during dialysis   HEMODIALYSIS FLOWSHEET:  Blood Flow Rate (mL/min): 199 mL/min Arterial Pressure (mmHg): -72.72 mmHg Venous Pressure (mmHg): 73.73 mmHg TMP (mmHg): 11.51 mmHg Ultrafiltration Rate (mL/min): 300 mL/min Dialysate Flow Rate (mL/min): 199 ml/min  Denies any pain or discomfort from PermCath  Objective:  Vital signs in last 24 hours:  Temp:  [97.7 F (36.5 C)-98 F (36.7 C)] 97.8 F (36.6 C) (05/07 0938) Pulse Rate:  [0-73] 62 (05/07 1030) Resp:  [14-30] 30 (05/07 1030) BP: (111-162)/(42-100) 127/47 (05/07 1030) SpO2:  [94 %-100 %] 96 % (05/07 1030) Weight:  [85 kg-88 kg] 85 kg (05/07 0938)  Weight change:  Filed Weights   08/28/23 1237 08/29/23 0938  Weight: 88 kg 85 kg    Intake/Output: I/O last 3 completed shifts: In: 247 [P.O.:240; I.V.:7] Out: 250 [Urine:250]   Intake/Output this shift:  No intake/output data recorded.  Physical Exam: General: NAD  Head: Normocephalic, atraumatic. Moist oral mucosal membranes  Eyes: Anicteric  Lungs:  Clear to auscultation, normal effort  Heart: Regular rate and rhythm  Abdomen:  Soft, nontender,   Extremities: Trace peripheral edema.  Neurologic: Alert moving all four extremities  Skin: No lesions  Access: Left  upper aVF (bruising), right chest PermCath    Basic Metabolic Panel: Recent Labs  Lab 08/28/23 1738 08/29/23 0444  NA 141 140  K 5.2* 4.8  CL 108 107  CO2 22 21*  GLUCOSE 100* 92  BUN 68* 70*  CREATININE 4.78* 4.71*  CALCIUM 8.8* 8.7*    Liver Function Tests: No results for input(s): "AST", "ALT", "ALKPHOS", "BILITOT", "PROT", "ALBUMIN" in the last 168 hours. No results for input(s): "LIPASE", "AMYLASE" in the last 168 hours. No results for input(s): "AMMONIA" in the last 168 hours.  CBC: Recent Labs  Lab 08/29/23 0712  WBC 7.3  HGB 11.1*  HCT 34.0*  MCV 99.4  PLT 152    Cardiac Enzymes: No results for input(s): "CKTOTAL", "CKMB", "CKMBINDEX", "TROPONINI" in the last 168 hours.  BNP: Invalid input(s): "POCBNP"  CBG: No results for input(s): "GLUCAP" in the last 168 hours.  Microbiology: Results for orders placed or performed during the hospital encounter of 06/04/23  Culture, blood (Routine X 2) w Reflex to ID Panel     Status: None   Collection Time: 06/04/23  9:21 AM   Specimen: BLOOD  Result Value Ref Range Status   Specimen Description BLOOD BLOOD LEFT ARM  Final   Special Requests   Final    BOTTLES DRAWN AEROBIC AND ANAEROBIC Blood Culture adequate volume   Culture   Final    NO GROWTH 5 DAYS Performed at [facility], [address] [place], [place] [address]    Report Status 06/09/2023 FINAL  Final  Culture, blood (Routine X 2) w Reflex to ID Panel     Status: None  Collection Time: 06/04/23  9:21 AM   Specimen: BLOOD  Result Value Ref Range Status   Specimen Description BLOOD BLOOD RIGHT HAND  Final   Special Requests   Final    BOTTLES DRAWN AEROBIC ONLY Blood Culture results may not be optimal due to an inadequate volume of blood received in culture bottles   Culture   Final    NO GROWTH 5 DAYS Performed at [facility], [address] [place], [place] [address]    Report Status 06/09/2023 FINAL  Final     Coagulation Studies: No results for input(s): "LABPROT", "INR" in the last 72 hours.  Urinalysis: No results for input(s): "COLORURINE", "LABSPEC", "PHURINE", "GLUCOSEU", "HGBUR", "BILIRUBINUR", "KETONESUR", "PROTEINUR", "UROBILINOGEN", "NITRITE", "LEUKOCYTESUR" in the last 72 hours.  Invalid input(s): "APPERANCEUR"    Imaging: PERIPHERAL VASCULAR CATHETERIZATION Result Date: 08/29/2023 See surgical note for result.    Medications:     budeson-glycopyrrolate -formoterol  2 puff Inhalation BID   busPIRone   5 mg Oral QHS   calcitRIOL   0.25 mcg Oral QHS   Chlorhexidine  Gluconate Cloth  6 each Topical Q0600   citalopram   40 mg Oral Daily   ezetimibe   10 mg Oral QHS   febuxostat   40 mg Oral QHS   gabapentin   200 mg Oral Daily   gabapentin   400 mg Oral QHS   heparin   5,000 Units Subcutaneous Q8H   metoprolol  succinate  50 mg Oral Daily   pantoprazole   40 mg Oral BH-q7a   sodium chloride  flush  3 mL Intravenous Q12H   sodium chloride  flush  3-10 mL Intravenous Q12H   spironolactone  25 mg Oral Daily   torsemide   20 mg Oral Daily   acetaminophen  **OR** acetaminophen , albuterol , HYDROmorphone (DILAUDID) injection, ondansetron  (ZOFRAN ) IV, polyethylene glycol, sodium chloride  flush  Assessment/ Plan:  Mr. [name] is a 78 y.o.  male with past medical conditions including CAD, basal cell carcinoma, arthritis, COPD, gout, CHF, hyperlipidemia, hypertension, and chronic kidney disease stage IV.  Patient presented to vascular surgery for placement of PermCath but was found to have elevated potassium.  Patient admitted under observation for End stage renal disease (HCC) [N18.6] Hyperkalemia [E87.5]   Hyperkalemia with end-stage renal disease on hemodialysis.  Potassium corrected to 4.8 with Lokelma .Receiving short dialysis schedule today. Due to successful placement of access, patient cleared to discharge and continue treatments at assigned clinic.   2.  Malfunctioning  dialysis access, outpatient clinic unable to cannulate.  Appreciate vascular surgery placing rt internal jugular permcath.     LOS: 0 Angely Dietz 5/7/202510:59 AM

## 2023-08-29 NOTE — Assessment & Plan Note (Signed)
 PermCath placed for dialysis today.  Patient had dialysis through the PermCath and tolerated well.  As per nephrology okay to discharge today because he has appointment on Friday for dialysis.

## 2023-08-29 NOTE — Care Management Obs Status (Signed)
 MEDICARE OBSERVATION STATUS NOTIFICATION   Patient Details  Name: [name] [name] MRN: [id number] Date of Birth: 1945/06/10   Medicare Observation Status Notification Given:  Yes    Anise Kerns 08/29/2023, 1:53 PM

## 2023-08-29 NOTE — Assessment & Plan Note (Signed)
 BMI 31.18 with current height and weight in computer

## 2023-08-29 NOTE — Progress Notes (Signed)
 MRN : [id number]  [name] [name] is a 78 y.o. ([birth year]) male who presents with chief complaint of check access.  History of Present Illness:   Patient presents to [facility] today for insertion of a permacath.  He is a 78 year old male with past medical conditions including CAD, basal cell carcinoma, arthritis, COPD, gout, CHF, hyperlipidemia, hypertension, and chronic kidney disease stage IV.  Patient seen in special recovery, scheduled for PermCath placement but found to have potassium 5.7.   Dx: End stage renal disease (HCC) [N18.6] Hyperkalemia [E87.5]   Patient is known to our practice and is followed by Dr. Zelda Hickman.  Patient recently began receiving outpatient dialysis treatments.  Went to receive first treatment and staff was unable to cannulate fistula.  Patient sent for PermCath placement.  Preliminary labs show potassium 5.7.  Patient currently awaiting vascular surgery to place access.  Patient appears in no distress.  Resting on stretcher, daughter and wife at bedside.  Right upper arm is bruised from cannulation attempt however remains patent with bruit and thrill.  Patient is on room air, trace lower extremity edema.   Recent potassium 5.7.  Confirm with vascular that due to this, no intervention was planned for today.  Patient prescribed Lokelma .    Current Meds  Medication Sig   acetaminophen  (TYLENOL ) 650 MG CR tablet Take 1,300 mg by mouth every 8 (eight) hours as needed for pain.   Ascorbic Acid  (VITAMIN C ) 1000 MG tablet Take 1,000 mg by mouth at bedtime.   busPIRone  (BUSPAR ) 5 MG tablet TAKE 1 TABLET BY MOUTH TWICE DAILY AS NEEDED (Patient taking differently: Take 5 mg by mouth at bedtime.)   calcitRIOL  (ROCALTROL ) 0.25 MCG capsule Take 0.25 mcg by mouth daily.   citalopram  (CELEXA ) 40 MG tablet TAKE 1 TABLET EVERY DAY (Patient taking differently: Take 40 mg by mouth every morning.)   colchicine  0.6 MG tablet  Take 0.3 mg by mouth at bedtime. Taking as needed only   ezetimibe  (ZETIA ) 10 MG tablet Take 1 tablet (10 mg total) by mouth at bedtime.   febuxostat  (ULORIC ) 40 MG tablet Take 40 mg by mouth at bedtime.   fexofenadine (ALLEGRA) 180 MG tablet Take 180 mg by mouth daily as needed for allergies or rhinitis.   Fluticasone -Umeclidin-Vilant (TRELEGY ELLIPTA ) 100-62.5-25 MCG/ACT AEPB Inhale 1 Inhalation into the lungs daily at 6 (six) AM. (Patient taking differently: Inhale 1 Inhalation into the lungs daily as needed (shortness of breath).)   gabapentin  (NEURONTIN ) 100 MG capsule TAKE 2 CAPSULES TWICE DAILY   gabapentin  (NEURONTIN ) 400 MG capsule TAKE 1 CAPSULE AT BEDTIME   metoprolol  succinate (TOPROL -XL) 50 MG 24 hr tablet TAKE 1 TABLET EVERY DAY (Patient taking differently: Take 50 mg by mouth every morning.)   Multiple Vitamins-Minerals (PRESERVISION AREDS) CAPS Take 1 capsule by mouth 2 (two) times daily.   OXYGEN  Inhale 2-3 L into the lungs See admin instructions. Used as needed throughout the day and continuous at bedtime   pantoprazole  (PROTONIX ) 40 MG tablet Take 1 tablet (40 mg total) by mouth every morning.   spironolactone (ALDACTONE) 25 MG tablet Take 25 mg by mouth daily.   SUPER B COMPLEX/C PO Take 1 tablet by mouth daily.   torsemide  (DEMADEX ) 20 MG tablet Take 20 mg by mouth daily.    Past Medical History:  Diagnosis Date   Accidental cut, puncture, perforation,  or hemorrhage during heart catheterization 12/27/2012   Acute kidney injury superimposed on stage 4 chronic kidney disease (HCC) 07/27/2020   Acute on chronic combined systolic and diastolic CHF (congestive heart failure) (HCC) 04/25/2023   Acute on chronic respiratory failure with hypoxia (HCC) 04/22/2023   Adopted    AKI (acute kidney injury) (HCC) 02/05/2023   ANA positive 07/12/2015   Aortic atherosclerosis (HCC)    Aortic stenosis, severe    a.) s/p TAVR on 08/01/2016: 29 mm CoreValve Evolut placed   Arthritis     Bacteremia due to Streptococcus 04/24/2023   Basal cell carcinoma 01/04/2010   Right sup. post. helix. Excised 03/02/2010   BCC (basal cell carcinoma of skin) 11/24/2020   R neck below the ear, EDC   BPH (benign prostatic hyperplasia)    CAD (coronary artery disease) 12/26/2012   a.) LHC 12/26/2012: 50% mLAD, 80% mLCx, 100% pRCA --> CVTS at [facility] --> decided again revascularization due to insig LAD disease and the fact that RCA lesion was chronic   CAP (community acquired pneumonia) 07/26/2020   Chronic respiratory failure with hypoxia (HCC)    Claudication of both lower extremities (HCC)    Complete tear of right rotator cuff 11/15/2015   Complex tear of lateral meniscus of left knee as current injury 12/06/2020   Complex tear of medial meniscus of left knee as current injury 10/18/2020   COPD (chronic obstructive pulmonary disease) (HCC)    Diarrhea 02/04/2023   Diet-controlled type 2 diabetes mellitus (HCC)    Diverticulosis    Dyspnea    Electrolyte abnormality 02/04/2023   ESRD (end stage renal disease) (HCC)    GERD (gastroesophageal reflux disease)    Gout    Heart murmur    HFrEF (heart failure with reduced ejection fraction) (HCC)    a.) s/p TAVR on 08/01/2016: 29 mm CoreValve Evolut placed   History of bilateral cataract extraction    History of hiatal hernia    HLD (hyperlipidemia)    Hyperkalemia 07/30/2020   Hypertension    Hypomagnesemia 02/05/2023   Hypothyroidism    IDA (iron deficiency anemia)    Migraines    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   a.) TTE 04/24/2023: moderate (MPG 7.5 mmHG)   OSA on CPAP    Pneumonia 07/2020   Pulmonary hypertension (HCC)    a.) TTE 08/17/2022: "mild pHTN" per echo report; b.) TTE 04/24/2023: RVSP 39.1   Recurrent nephrolithiasis 12/27/2012   Rotator cuff tendinitis, right 11/15/2015   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 MOHS.   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/[place])     Past Surgical History:  Procedure Laterality Date   AORTIC VALVE REPLACEMENT N/A 08/01/2016   29 mm CoreValve Evolut; Location: [facility]; Surgeon: Forbes Ida, MD   AV FISTULA PLACEMENT Right 06/27/2023   Procedure: ARTERIOVENOUS (AV) FISTULA CREATION (BRACHIALCEPHALIC);  Surgeon: Jackquelyn Mass, MD;  Location: [facility];  Service: Vascular;  Laterality: Right;   CARDIAC CATHETERIZATION N/A 12/26/2012   2v CAD; retained pigtail  in myocardium --> transferred to [facility]; Location: [facility]; Surgeon: Rinda Cheers, MD   CATARACT EXTRACTION W/PHACO Left 10/03/2022   Procedure: CATARACT EXTRACTION PHACO AND INTRAOCULAR LENS PLACEMENT (IOC) LEFT DIABETIC 8.22 00:55.5;  Surgeon: Clair Crews, MD;  Location: [name];  Service: Ophthalmology;  Laterality: Left;  sleep apnea   CATARACT EXTRACTION W/PHACO Right 10/17/2022   Procedure: CATARACT EXTRACTION PHACO AND INTRAOCULAR LENS PLACEMENT (IOC) RIGHT DIABETIC;  Surgeon: Clair Crews, MD;  Location: [facility];  Service: Ophthalmology;  Laterality: Right;  4.79 0:37.4   COLONOSCOPY     COLONOSCOPY N/A 04/28/2023   Procedure: COLONOSCOPY;  Surgeon: Toledo, Alphonsus Jeans, MD;  Location: [facility];  Service: Gastroenterology;  Laterality: N/A;   KNEE ARTHROSCOPY WITH MEDIAL MENISECTOMY Left 12/02/2020   Procedure: KNEE ARTHROSCOPY WITH DEBRIDEMENT AND PARTIAL MEDIAL AND LATERAL MENISECTOMY;  Surgeon: Elner Hahn, MD;  Location: [facility];  Service: Orthopedics;  Laterality: Left;   LITHOTRIPSY     PERCUTANEOUS  REMOVAL INTRA-AORTIC BALLOON CATH N/A 12/26/2012   Procedure: PERCUTANEOUS REMOVAL INTRA-AORTIC BALLOON CATH; Surgeon: Erik Havens, MD; Location: [facility]; Service: Cardiothoracic   POLYPECTOMY  04/28/2023   Procedure: POLYPECTOMY;  Surgeon: Corky Diener, Alphonsus Jeans, MD;  Location: [facility];  Service: Gastroenterology;;   RIGHT HEART CATH Right 07/13/2016   Location: [facility]; Surgeon: Toula French, MD   TEE WITHOUT CARDIOVERSION Right 04/26/2023   Procedure: TRANSESOPHAGEAL ECHOCARDIOGRAM (TEE);  Surgeon: Cherrie Cornwall, MD;  Location: [facility];  Service: Cardiovascular;  Laterality: Right;   TRANSESOPHAGEAL ECHOCARDIOGRAM N/A 12/26/2012   Procedure: TRANSESOPHAGEAL ECHOCARDIOGRAPHY; Surgeon: Erik Havens, MD; Location: [facility]; Service: Cardiothoracic   UMBILICAL HERNIA REPAIR N/A 02/13/2014   Procedure: LAPAROSCOPIC UMBILICAL HERNIA REPAIR; Surgeon: Oma Bias, MD; Location: [facility]; Service: General Surgery    Social History Social History   Tobacco Use   Smoking status: Former    Current packs/day: 0.00    Average packs/day: 2.0 packs/day for 54.0 years (108.0 ttl pk-yrs)    Types: Cigarettes    Start date: 77    Quit date: 2010    Years since quitting: 15.3   Smokeless tobacco: Former    Types: Chew    Quit date: 05/16/2004  Vaping Use   Vaping status: Never Used  Substance Use Topics   Alcohol use: Yes    Comment: rare beer   Drug use: Never    Family History Family History  Adopted: Yes  Problem Relation Age of Onset   Varicose Veins Daughter    Obesity Daughter    Hyperlipidemia Daughter    Diabetes Daughter    COPD Daughter    Depression Daughter    Asthma Daughter    Arthritis Son    Diabetes Son    Hypertension Son     Allergies  Allergen Reactions   Nsaids Other (See Comments)    Avoid due to kidney disease    Statins Other (See Comments)    Myalgias   Nexletol [Bempedoic Acid] Diarrhea    fatigue    Silver Other (See Comments)   Tegaderm High Gelling Alginate [Wound Dressings] Rash     REVIEW OF SYSTEMS (Negative unless checked)  Constitutional: [] Weight loss  [] Fever  [] Chills Cardiac: [] Chest pain   [] Chest pressure   [] Palpitations   [] Shortness of breath when laying flat   [] Shortness of breath with exertion. Vascular:  [] Pain in legs with walking   [] Pain in legs at rest  [] History of DVT   []   Phlebitis   [] Swelling in legs   [] Varicose veins   [] Non-healing ulcers Pulmonary:   [] Uses home oxygen    [] Productive cough   [] Hemoptysis   [] Wheeze  [] COPD   [] Asthma Neurologic:  [] Dizziness   [] Seizures   [] History of stroke   [] History of TIA  [] Aphasia   [] Vissual changes   [] Weakness or numbness in arm   [] Weakness or numbness in leg Musculoskeletal:   [] Joint swelling   [] Joint pain   [] Low back pain Hematologic:  [] Easy bruising  [] Easy bleeding   [] Hypercoagulable state   [] Anemic Gastrointestinal:  [] Diarrhea   [] Vomiting  [] Gastroesophageal reflux/heartburn   [] Difficulty swallowing. Genitourinary:  [x] Chronic kidney disease   [] Difficult urination  [] Frequent urination   [] Blood in urine Skin:  [] Rashes   [] Ulcers  Psychological:  [] History of anxiety   []  History of major depression.  Physical Examination  Vitals:   08/28/23 1648 08/28/23 1726 08/28/23 2111 08/29/23 0531  BP: (!) 128/46 125/85 (!) 155/58 (!) 158/59  Pulse:  65 67 68  Resp:  (!) 21 20 16   Temp:  97.7 F (36.5 C) 97.7 F (36.5 C) 97.9 F (36.6 C)  TempSrc:  Oral Oral   SpO2:  100% 97% 99%  Weight:      Height:       Body mass index is 32.28 kg/m. Gen: WD/WN, NAD Head: [place]/AT, No temporalis wasting.  Ear/Nose/Throat: Hearing grossly intact, nares w/o erythema or drainage Eyes: PER, EOMI, sclera nonicteric.  Neck: Supple, no gross masses or lesions.  No JVD.  Pulmonary:  Good air movement, no audible wheezing, no use of accessory muscles.  Cardiac: RRR, precordium non-hyperdynamic. Vascular:     Vessel Right Left  Radial Palpable Palpable  Brachial Palpable Palpable  Gastrointestinal: soft, non-distended. No guarding/no peritoneal signs.  Musculoskeletal: M/S 5/5 throughout.  No deformity.  Neurologic: CN 2-12 intact. Pain and light touch intact in extremities.  Symmetrical.  Speech is fluent. Motor exam as listed above. Psychiatric: Judgment intact, Mood & affect appropriate for pt's clinical situation. Dermatologic: No rashes or ulcers noted.  No changes consistent with cellulitis.   CBC Lab Results  Component Value Date   WBC 7.3 08/29/2023   HGB 11.1 (L) 08/29/2023   HCT 34.0 (L) 08/29/2023   MCV 99.4 08/29/2023   PLT 152 08/29/2023    BMET    Component Value Date/Time   NA 140 08/29/2023 0444   NA 142 03/06/2023 0942   NA 138 07/27/2014 1247   K 4.8 08/29/2023 0444   K 4.2 07/27/2014 1247   CL 107 08/29/2023 0444   CL 105 07/27/2014 1247   CO2 21 (L) 08/29/2023 0444   CO2 26 07/27/2014 1247   GLUCOSE 92 08/29/2023 0444   GLUCOSE 135 (H) 07/27/2014 1247   BUN 70 (H) 08/29/2023 0444   BUN 47 (H) 03/06/2023 0942   BUN 28 (H) 07/27/2014 1247   CREATININE 4.71 (H) 08/29/2023 0444   CREATININE 1.64 (H) 07/27/2014 1247   CALCIUM 8.7 (L) 08/29/2023 0444   CALCIUM 8.7 (L) 07/27/2014 1247   GFRNONAA 12 (L) 08/29/2023 0444   GFRNONAA 42 (L) 07/27/2014 1247   GFRAA 23 (L) 07/24/2016 1919   GFRAA 48 (L) 07/27/2014 1247   Estimated Creatinine Clearance: 13 mL/min (A) (by C-G formula based on SCr of 4.71 mg/dL (H)).  COAG Lab Results  Component Value Date   INR 1.1 04/21/2023   INR 1.0 07/27/2014    Radiology No results found.   Assessment/Plan  End stage renal disease: Recommend:  The patient requires dialysis access.  Patient should have a tunneled catheter.    The risks, benefits and alternative therapies were reviewed in detail with the patient.  All questions were answered.  The patient agrees to proceed with angio/intervention.    The patient  will follow up with me in the office after the procedure.    Devon Fogo, MD  08/29/2023 7:43 AM

## 2023-08-29 NOTE — Discharge Summary (Signed)
 Physician Discharge Summary   Patient: [name] [name] MRN: [id number] DOB: 1945-08-02  Admit date:     08/28/2023  Discharge date: 08/29/23  Discharge Physician: Verla Glaze   PCP: Trenda Frisk, FNP   Recommendations at discharge:   Follow-up PCP 5 days Keep appointment for dialysis on Friday.  Discharge Diagnoses: Principal Problem:   Hyperkalemia Active Problems:   ESRD (end stage renal disease) (HCC)   COPD (chronic obstructive pulmonary disease) (HCC)   Chronic respiratory failure with hypoxia (HCC)   Essential hypertension   OSA on CPAP   HFrEF (heart failure with reduced ejection fraction) (HCC)   Type 2 diabetes mellitus with chronic kidney disease, without long-term current use of insulin  (HCC)   Obesity (BMI 30-39.9)    Hospital Course: 78 y.o. 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  TRH consulted for hyperkalemia.   Mr. Toro states he was in his usual state of health earlier today when he came to the hospital for permacath placement.  For unknown reason, his right AV fistula was accessed with bruising and he is quite upset about this.  Otherwise, he states that he feels at his baseline.  He denies any chest pain, palpitations.  He endorses chronic unchanged shortness of breath.   Hospital course: On arrival to the hospital, patient was hypertensive at 111/100 with heart rate of 62.  He was saturating at 98% on home 2 L.  He was afebrile at 98.  Initial workup notable for potassium of 5.7.  TRH contacted for admission.  Nephrology consulted.  5/7.  Patient had PermCath placed by [name] today right upper chest.  Patient had dialysis through the PermCath and tolerated it.  As per nephrology  already set up for dialysis on Friday and can be discharged today.  Assessment and Plan: * Hyperkalemia Today's potassium 4.8.  Patient able to have a PermCath and dialysis today.  ESRD (end stage renal disease) (HCC) PermCath placed for dialysis today.  Patient had dialysis through the PermCath and tolerated well.  As per nephrology okay to discharge today because he has appointment on Friday for dialysis.  Obesity (BMI 30-39.9) BMI 31.18 with current height and weight in computer  Type 2 diabetes mellitus with chronic kidney disease, without long-term current use of insulin  (HCC) Diet controlled type 2 diabetes.  No indication for SSI at this time  HFrEF (heart failure with reduced ejection fraction) (HCC) History of HFrEF with EF as low as 40-45% when last checked in January 2025.  He appears euvolemic at this time.  - Continue home regimen.  Dialysis should help out with fluid removal.  OSA on CPAP CPAP at bedtime  Essential hypertension Continue Demadex , Toprol  and spironolactone  Chronic respiratory failure with hypoxia (HCC) - Continue home 2 L of supplemental oxygen   COPD (chronic obstructive pulmonary disease) (HCC) Patient states he experiences dyspnea on exertion and shortness of breath but this is unchanged and chronic.  - Continue home bronchodilators - DuoNebs as needed         Consultants: Nephrology, vascular surgery Procedures performed: Permacath placement and dialysis Disposition: Home Diet recommendation:  Renal diet DISCHARGE MEDICATION: Allergies as of 08/29/2023       Reactions   Nsaids Other (See Comments)  Avoid due to kidney disease    Statins Other (See Comments)   Myalgias   Nexletol [bempedoic Acid] Diarrhea   fatigue   Silver Other (See Comments)   Tegaderm High Gelling Alginate [wound Dressings] Rash        Medication List     STOP taking these medications    GAS-X PO   HYDROcodone -acetaminophen  5-325 MG  tablet Commonly known as: NORCO/VICODIN   ondansetron  4 MG disintegrating tablet Commonly known as: ZOFRAN -ODT   sodium chloride  0.65 % Soln nasal spray Commonly known as: OCEAN       TAKE these medications    acetaminophen  650 MG CR tablet Commonly known as: TYLENOL  Take 1,300 mg by mouth every 8 (eight) hours as needed for pain.   albuterol  108 (90 Base) MCG/ACT inhaler Commonly known as: VENTOLIN  HFA Inhale 2 puffs into the lungs every 6 (six) hours as needed for wheezing or shortness of breath.   busPIRone  5 MG tablet Commonly known as: BUSPAR  TAKE 1 TABLET BY MOUTH TWICE DAILY AS NEEDED What changed: when to take this   calcitRIOL  0.25 MCG capsule Commonly known as: ROCALTROL  Take 0.25 mcg by mouth daily.   citalopram  40 MG tablet Commonly known as: CELEXA  TAKE 1 TABLET EVERY DAY What changed: when to take this   colchicine  0.6 MG tablet Take 0.3 mg by mouth at bedtime. Taking as needed only   DropSafe Alcohol Prep 70 % Pads Apply 1 each topically as needed (With repatha and blood sugar checks.).   Evolocumab 140 MG/ML Soaj Inject 140 mg into the skin every 14 (fourteen) days.   ezetimibe  10 MG tablet Commonly known as: ZETIA  Take 1 tablet (10 mg total) by mouth at bedtime.   febuxostat  40 MG tablet Commonly known as: ULORIC  Take 40 mg by mouth at bedtime.   fexofenadine 180 MG tablet Commonly known as: ALLEGRA Take 180 mg by mouth daily as needed for allergies or rhinitis.   gabapentin  400 MG capsule Commonly known as: NEURONTIN  TAKE 1 CAPSULE AT BEDTIME   gabapentin  100 MG capsule Commonly known as: NEURONTIN  TAKE 2 CAPSULES TWICE DAILY   metoprolol  succinate 50 MG 24 hr tablet Commonly known as: TOPROL -XL TAKE 1 TABLET EVERY DAY What changed: when to take this   OXYGEN  Inhale 2-3 L into the lungs See admin instructions. Used as needed throughout the day and continuous at bedtime   pantoprazole  40 MG tablet Commonly known as:  PROTONIX  Take 1 tablet (40 mg total) by mouth every morning.   PreserVision AREDS Caps Take 1 capsule by mouth 2 (two) times daily.   spironolactone 25 MG tablet Commonly known as: ALDACTONE Take 25 mg by mouth daily.   SUPER B COMPLEX/C PO Take 1 tablet by mouth daily.   torsemide  20 MG tablet Commonly known as: DEMADEX  Take 20 mg by mouth daily.   Trelegy Ellipta  100-62.5-25 MCG/ACT Aepb Generic drug: Fluticasone -Umeclidin-Vilant Inhale 1 Inhalation into the lungs daily at 6 (six) AM. What changed:  when to take this reasons to take this   vitamin C  1000 MG tablet Take 1,000 mg by mouth at bedtime.        Follow-up Information     Trenda Frisk, FNP. Go on 09/05/2023.   Specialty: Family Medicine Why: Go @ 11:15am. Contact information: [address] [place] [address] [phone]                Discharge Exam: Filed Weights   08/28/23 1237 08/29/23 2725 08/29/23  1227  Weight: 88 kg 85 kg 85 kg   Physical Exam HENT:     Head: Normocephalic.  Eyes:     General: Lids are normal.     Conjunctiva/sclera: Conjunctivae normal.  Cardiovascular:     Rate and Rhythm: Normal rate and regular rhythm.     Heart sounds: Normal heart sounds, S1 normal and S2 normal.  Pulmonary:     Breath sounds: Examination of the right-middle field reveals decreased breath sounds. Examination of the left-middle field reveals decreased breath sounds. Examination of the right-lower field reveals decreased breath sounds. Examination of the left-lower field reveals decreased breath sounds. Decreased breath sounds present. No wheezing, rhonchi or rales.  Abdominal:     Palpations: Abdomen is soft.     Tenderness: There is no abdominal tenderness.  Musculoskeletal:     Right lower leg: No swelling.     Left lower leg: No swelling.  Skin:    General: Skin is warm.     Findings: No rash.  Neurological:     Mental Status: He is alert and oriented to person, place, and  time.      Condition at discharge: stable  The results of significant diagnostics from this hospitalization (including imaging, microbiology, ancillary and laboratory) are listed below for reference.   Imaging Studies: PERIPHERAL VASCULAR CATHETERIZATION Result Date: 08/29/2023 See surgical note for result.   Microbiology:   Labs: CBC: Recent Labs  Lab 08/29/23 0712  WBC 7.3  HGB 11.1*  HCT 34.0*  MCV 99.4  PLT 152   Basic Metabolic Panel: Recent Labs  Lab 08/28/23 1738 08/29/23 0444  NA 141 140  K 5.2* 4.8  CL 108 107  CO2 22 21*  GLUCOSE 100* 92  BUN 68* 70*  CREATININE 4.78* 4.71*  CALCIUM 8.8* 8.7*   Liver Function Tests: No results for input(s): "AST", "ALT", "ALKPHOS", "BILITOT", "PROT", "ALBUMIN" in the last 168 hours. CBG: No results for input(s): "GLUCAP" in the last 168 hours.  Discharge time spent: greater than 30 minutes.  Signed: Verla Glaze, MD [facility] 08/29/2023

## 2023-08-29 NOTE — Plan of Care (Signed)
  Problem: Education: Goal: Knowledge of General Education information will improve Description Including pain rating scale, medication(s)/side effects and non-pharmacologic comfort measures Outcome: Progressing   Problem: Health Behavior/Discharge Planning: Goal: Ability to manage health-related needs will improve Outcome: Progressing   Problem: Clinical Measurements: Goal: Respiratory complications will improve Outcome: Progressing   Problem: Activity: Goal: Risk for activity intolerance will decrease Outcome: Progressing   Problem: Nutrition: Goal: Adequate nutrition will be maintained Outcome: Progressing   Problem: Safety: Goal: Ability to remain free from injury will improve Outcome: Progressing   

## 2023-08-29 NOTE — Op Note (Signed)
 [facility]   OPERATIVE NOTE     PROCEDURE: 1. Insertion of a right IJ tunneled dialysis catheter. 2. Catheter placement and cannulation under ultrasound and fluoroscopic guidance  PRE-OPERATIVE DIAGNOSIS: end-stage renal requiring hemodialysis  POST-OPERATIVE DIAGNOSIS: same as above  SURGEON: Devon Fogo  ANESTHESIA: Conscious sedation was administered under my direct supervision by the interventional radiology RN. IV Versed  plus fentanyl  were utilized. Continuous ECG, pulse oximetry and blood pressure was monitored throughout the entire procedure.  Conscious sedation was for a total of 20 minutes.  ESTIMATED BLOOD LOSS: Minimal  FINDING(S): 1.  Tips of the catheter in the right atrium on fluoroscopy 2.  No obvious pneumothorax on fluoroscopy  SPECIMEN(S):  none  INDICATIONS:   [name] [name] is a 78 y.o. male  presents with end stage renal disease.  Therefore, the patient requires a tunneled dialysis catheter placement.  The patient is informed of  the risks catheter placement include but are not limited to: bleeding, infection, central venous injury, pneumothorax, possible venous stenosis, possible malpositioning in the venous system, and possible infections related to long-term catheter presence.  The patient was aware of these risks and agreed to proceed.  DESCRIPTION: The patient was taken back to Special Procedure suite.  Prior to sedation, the patient was given IV antibiotics.  After obtaining adequate sedation, the patient was prepped and draped in the standard fashion for a right IJ tunneled dialysis catheter placement.  Appropriate Time Out is called.     The right neck and chest wall are then infiltrated with 1% Lidocaine  with epinepherine.  A 19 cm tip to cuff catheter is then selected, opened on the back table and prepped. Ultrasound is placed in a sterile sleeve.  Under ultrasound guidance, the right IJ vein is examined and is  noted to be echolucent and easily compressible indicating patency.   An image is recorded for the permanent record.  The right IJ vein is cannulated with the microneedle under direct ultrasound vissualization.  A Microwire followed by a micro sheath is then inserted without difficulty.   A J-wire was then advanced under fluoroscopic guidance into the inferior vena cava and the wire was secured.  Small counter incision was then made at the wire insertion site. A small pocket was fashioned with blunt dissection to allow easier passage of the cuff.  The dilator and peel-away sheath are then advanced over the wire under fluoroscopic guidance. The catheters and advanced through the peel-away sheath. It is approximated to the chest wall after verifying the tips at the atrial caval junction and an exit site is selected.  Small incision is made at the selected exit site and the tunneling device was passed subcutaneously to the counter incision. Catheter is then pulled through the subcutaneous tunnel. The catheter is then verified for tip position under fluoroscopy, transected and the hub assembly connected.    Each port was tested by aspirating and flushing.  No resistance was noted.  Each port was then thoroughly flushed with heparinized saline.  The catheter was secured in placed with two interrupted stitches of 0 silk tied to the catheter.  The counter incision was closed with a U-stitch of 4-0 Monocryl.  The insertion site is then cleaned and sterile bandages applied including a Biopatch.  Each port was then packed with concentrated heparin  (1000 Units/mL) at the manufacturer recommended volumes to each port.  Sterile caps were applied to each port.  On completion fluoroscopy, the tips of the catheter  were in the right atrium, and there was no evidence of pneumothorax.  COMPLICATIONS: None  CONDITION: Unchanged   Devon Fogo [facility] Office: [phone]   08/29/2023, 9:23  AM

## 2023-08-29 NOTE — Hospital Course (Signed)
 77 y.o. 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  TRH consulted for hyperkalemia.   [name] [name] states he was in his usual state of health earlier today when he came to the hospital for permacath placement.  For unknown reason, his right AV fistula was accessed with bruising and he is quite upset about this.  Otherwise, he states that he feels at his baseline.  He denies any chest pain, palpitations.  He endorses chronic unchanged shortness of breath.   Hospital course: On arrival to the hospital, patient was hypertensive at 111/100 with heart rate of 62.  He was saturating at 98% on home 2 L.  He was afebrile at 98.  Initial workup notable for potassium of 5.7.  TRH contacted for admission.  Nephrology consulted.  5/7.  Patient had PermCath placed by [name] today right upper chest.  Patient had dialysis through the PermCath and tolerated it.  As per nephrology already set up for dialysis on Friday and can be discharged today.

## 2023-08-29 NOTE — Interval H&P Note (Signed)
 History and Physical Interval Note:  08/29/2023 7:47 AM  [name] [name]  has presented today for surgery, with the diagnosis of ESRD.  The various methods of treatment have been discussed with the patient and family. After consideration of risks, benefits and other options for treatment, the patient has consented to  Procedure(s): DIALYSIS/PERMA CATHETER INSERTION (N/A) as a surgical intervention.  The patient's history has been reviewed, patient examined, no change in status, stable for surgery.  I have reviewed the patient's chart and labs.  Questions were answered to the patient's satisfaction.     Devon Fogo

## 2023-08-30 LAB — HEPATITIS B SURFACE ANTIBODY, QUANTITATIVE: Hep B S AB Quant (Post): <3.5 m[IU]/mL — ABNORMAL LOW

## 2023-08-31 DIAGNOSIS — Z992 Dependence on renal dialysis: Secondary | ICD-10-CM | POA: Diagnosis not present

## 2023-08-31 DIAGNOSIS — N186 End stage renal disease: Secondary | ICD-10-CM | POA: Diagnosis not present

## 2023-09-03 ENCOUNTER — Other Ambulatory Visit: Payer: Self-pay

## 2023-09-03 ENCOUNTER — Other Ambulatory Visit: Payer: Self-pay | Admitting: *Deleted

## 2023-09-03 DIAGNOSIS — J449 Chronic obstructive pulmonary disease, unspecified: Secondary | ICD-10-CM | POA: Diagnosis not present

## 2023-09-03 NOTE — Patient Instructions (Signed)
 Visit Information  Thank you for taking time to visit with me today. Please don't hesitate to contact me if I can be of assistance to you before our next scheduled appointment.  Your next care management appointment is by telephone on 10/09/23 at 1:15 pm  Outreach to [facility] RN case manager as needed  Please call the care guide team at [phone] if you need to cancel, schedule, or reschedule an appointment.   Please call the Suicide and Crisis Lifeline: 988 call the [place]  National Suicide Prevention Lifeline: 770-153-1316 or TTY: 315-252-0969 TTY 972-193-9077) to talk to a trained counselor call 1-800-273-TALK (toll free, 24 hour hotline) call 911 if you are experiencing a Mental Health or Behavioral Health Crisis or need someone to talk to.  Wm Fruchter L. Mcarthur Speedy, RN, BSN, CCM [place]  [facility], [facility] RN Care Manager Direct Dial: [phone]  Fax: [phone]

## 2023-09-03 NOTE — Patient Outreach (Signed)
 Complex Care Management   Visit Note  09/03/2023  Name:  [name] [name] MRN: [id number] DOB: 09-Mar-1946  Situation: Referral received for Complex Care Management related to Heart Failure, ESRD, and Chronic Kidney Disease I obtained verbal consent from Patient.  Visit completed with Mr [name] [name] & Mrs [name] [name]  on the phone about his new perm cath, fistula for Monday, Wednesday, Friday hemodialysis.   Background:   Past Medical History:  Diagnosis Date   Accidental cut, puncture, perforation, or hemorrhage during heart catheterization 12/27/2012   Acute kidney injury superimposed on stage 4 chronic kidney disease (HCC) 07/27/2020   Acute on chronic combined systolic and diastolic CHF (congestive heart failure) (HCC) 04/25/2023   Acute on chronic respiratory failure with hypoxia (HCC) 04/22/2023   Adopted    AKI (acute kidney injury) (HCC) 02/05/2023   ANA positive 07/12/2015   Aortic atherosclerosis (HCC)    Aortic stenosis, severe    a.) s/p TAVR on 08/01/2016: 29 mm CoreValve Evolut placed   Arthritis    Bacteremia due to Streptococcus 04/24/2023   Basal cell carcinoma 01/04/2010   Right sup. post. helix. Excised 03/02/2010   BCC (basal cell carcinoma of skin) 11/24/2020   R neck below the ear, EDC   BPH (benign prostatic hyperplasia)    CAD (coronary artery disease) 12/26/2012   a.) LHC 12/26/2012: 50% mLAD, 80% mLCx, 100% pRCA --> [facility] at [facility] --> decided again revascularization due to insig LAD disease and the fact that RCA lesion was chronic   CAP (community acquired pneumonia) 07/26/2020   Chronic respiratory failure with hypoxia (HCC)    Claudication of both lower extremities (HCC)    Complete tear of right rotator cuff 11/15/2015   Complex tear of lateral meniscus of left knee as current injury 12/06/2020   Complex tear of medial meniscus of left knee as current injury 10/18/2020   COPD (chronic obstructive pulmonary disease) (HCC)    Diarrhea  02/04/2023   Diet-controlled type 2 diabetes mellitus (HCC)    Diverticulosis    Dyspnea    Electrolyte abnormality 02/04/2023   ESRD (end stage renal disease) (HCC)    GERD (gastroesophageal reflux disease)    Gout    Heart murmur    HFrEF (heart failure with reduced ejection fraction) (HCC)    a.) s/p TAVR on 08/01/2016: 29 mm CoreValve Evolut placed   History of bilateral cataract extraction    History of hiatal hernia    HLD (hyperlipidemia)    Hyperkalemia 07/30/2020   Hypertension    Hypomagnesemia 02/05/2023   Hypothyroidism    IDA (iron deficiency anemia)    Migraines    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   a.) TTE 04/24/2023: moderate (MPG 7.5 mmHG)   OSA on CPAP    Pneumonia 07/2020   Pulmonary hypertension (HCC)    a.) TTE 08/17/2022: "mild pHTN" per echo report; b.) TTE 04/24/2023: RVSP 39.1   Recurrent nephrolithiasis 12/27/2012   Rotator cuff tendinitis, right 11/15/2015   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 MOHS.   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/07/2021   Right zygoma, [facility] 05/02/21  Statin intolerance (myalgias)    Supplemental oxygen  dependent (2-3 L/[place])     Assessment: Patient Reported Symptoms:  Cognitive Cognitive Status: Alert and oriented to person, place, and time, Insightful and able to interpret abstract concepts, Normal speech and language skills      Neurological Neurological Review of Symptoms: No symptoms reported Neurological Management Strategies: Routine screening, Adequate rest Neurological Self-Management Outcome: 4 (good)  HEENT HEENT Symptoms Reported: Change or loss  of hearing, Tearing HEENT Conditions: Ear problem(s), Vision problem(s) Vision Problems: blindness/vision loss HEENT Management Strategies: Medication therapy, Routine screening, Adequate rest HEENT Self-Management Outcome: 4 (good) HEENT Comment: 1st day of eye shot feels numb the first 16-20 hours then feels lots better Ear problem(s), Vision problem(s)  Cardiovascular Cardiovascular Symptoms Reported: No symptoms reported Cardiovascular Conditions: Coronary artery disease, Heart failure, High blood cholesterol, Hypertension Cardiovascular Management Strategies: Adequate rest, Routine screening, Diet modification Cardiovascular Self-Management Outcome: 4 (good) Cardiovascular Comment: confirms less swelling of legs with dialysis, use of compression hose, diuretic  Respiratory Respiratory Symptoms Reported: No symptoms reported Respiratory Conditions: COPD, Shortness of breath, Sleep disordered breathing Respiratory Self-Management Outcome: 4 (good)  Endocrine Patient reports the following symptoms related to hypoglycemia or hyperglycemia : Blurry vision Other symptoms related to hypoglycemia or hyperglycemia: A1c 5 when low cbg had dizzness Is patient diabetic?: Yes Is patient checking blood sugars at home?: Yes Endocrine Conditions: Diabetes, Hypoglycemia (nondiabetic) Endocrine Management Strategies: Adequate rest, Diet modification, Medication therapy, Routine screening Endocrine Self-Management Outcome: 4 (good)  Gastrointestinal Gastrointestinal Symptoms Reported: No symptoms reported Gastrointestinal Conditions: Reflux/heartburn Gastrointestinal Management Strategies: Adequate rest, Diet modification, Medication therapy Gastrointestinal Self-Management Outcome: 4 (good) Nutrition Risk Screen (CP): No indicators present  Genitourinary Genitourinary Symptoms Reported: Not assessed    Integumentary Integumentary Symptoms Reported: Bruising Skin Conditions: Other Other Skin  Conditions: bruises on right arm & chest Skin Management Strategies: Adequate rest, Coping strategies, Routine screening Skin Self-Management Outcome: 4 (good)  Musculoskeletal Musculoskelatal Symptoms Reviewed: No symptoms reported Musculoskeletal Conditions: Unsteady gait Musculoskeletal Management Strategies: Adequate rest, Medication therapy, Routine screening Musculoskeletal Self-Management Outcome: 3 (uncertain)      Psychosocial Psychosocial Symptoms Reported: No symptoms reported Behavioral Health Conditions: Anxiety, Depression Behavioral Management Strategies: Adequate rest Behavioral Health Self-Management Outcome: 4 (good)          09/03/2023    2:35 PM  Depression screen PHQ 2/9  Decreased Interest 0  Down, Depressed, Hopeless 0  PHQ - 2 Score 0    There were no vitals filed for this visit.  Medications Reviewed Today     Reviewed by Arlyce Berger, RN (Registered Nurse) on 09/03/23 at 1440  Med List Status: <None>   Medication Order Taking? Sig Documenting Provider Last Dose Status Informant  acetaminophen  (TYLENOL ) 650 MG CR tablet [id number] Yes Take 1,300 mg by mouth every 8 (eight) hours as needed for pain. [provider] Taking Active Spouse/Significant Other  albuterol  (VENTOLIN  HFA) 108 (90 Base) MCG/ACT inhaler [id number] Yes Inhale 2 puffs into the lungs every 6 (six) hours as needed for wheezing or shortness of breath. [provider] Taking Active Spouse/Significant Other  Alcohol Swabs (DROPSAFE ALCOHOL PREP) 70 % PADS [id number] Yes Apply 1 each topically as needed (With repatha and blood sugar checks.). [provider] Taking Active Spouse/Significant Other  Ascorbic Acid  (VITAMIN C ) 1000 MG tablet [id number] Yes Take 1,000 mg by mouth at bedtime. [provider] Taking Active Spouse/Significant Other  busPIRone  (BUSPAR ) 5 MG tablet [id number] Yes TAKE 1 TABLET BY MOUTH TWICE  DAILY AS NEEDED  Patient taking  differently: Take 5 mg by mouth at bedtime.   Trenda Frisk, FNP Taking Active Spouse/Significant Other  calcitRIOL  (ROCALTROL ) 0.25 MCG capsule [id number] Yes Take 0.25 mcg by mouth daily. [provider] Taking Active Spouse/Significant Other  citalopram  (CELEXA ) 40 MG tablet [id number] Yes TAKE 1 TABLET EVERY DAY  Patient taking differently: Take 40 mg by mouth every morning.   Trenda Frisk, FNP Taking Active Spouse/Significant Other  colchicine  0.6 MG tablet [id number] Yes Take 0.3 mg by mouth at bedtime. Taking as needed only [provider] Taking Active Spouse/Significant Other  Evolocumab 140 MG/ML SOAJ [id number] Yes Inject 140 mg into the skin every 14 (fourteen) days. [provider] Taking Active   ezetimibe  (ZETIA ) 10 MG tablet [id number] Yes Take 1 tablet (10 mg total) by mouth at bedtime. Trenda Frisk, FNP Taking Active   febuxostat  (ULORIC ) 40 MG tablet [id number] Yes Take 40 mg by mouth at bedtime. [provider] Taking Active Spouse/Significant Other  fexofenadine (ALLEGRA) 180 MG tablet [id number] Yes Take 180 mg by mouth daily as needed for allergies or rhinitis. [provider] Taking Active Spouse/Significant Other  Fluticasone -Umeclidin-Vilant (TRELEGY ELLIPTA ) 100-62.5-25 MCG/ACT AEPB [id number] Yes Inhale 1 Inhalation into the lungs daily at 6 (six) AM.  Patient taking differently: Inhale 1 Inhalation into the lungs daily as needed (shortness of breath).   Trenda Frisk, FNP Taking Active Spouse/Significant Other  gabapentin  (NEURONTIN ) 100 MG capsule [id number] Yes TAKE 2 CAPSULES TWICE DAILY Trenda Frisk, FNP Taking Active Spouse/Significant Other  gabapentin  (NEURONTIN ) 400 MG capsule [id number] Yes TAKE 1 CAPSULE AT BEDTIME Trenda Frisk, FNP Taking Active Spouse/Significant Other  metoprolol  succinate (TOPROL -XL) 50 MG 24 hr tablet [id number] Yes TAKE 1 TABLET EVERY DAY  Patient taking differently: Take 50 mg  by mouth every morning.   Trenda Frisk, FNP Taking Active Spouse/Significant Other  Multiple Vitamins-Minerals (PRESERVISION AREDS) CAPS [id number] Yes Take 1 capsule by mouth 2 (two) times daily. [provider] Taking Active Spouse/Significant Other  OXYGEN  [id number] Yes Inhale 2-3 L into the lungs See admin instructions. Used as needed throughout the day and continuous at bedtime [provider] Taking Active Spouse/Significant Other  pantoprazole  (PROTONIX ) 40 MG tablet [id number] Yes Take 1 tablet (40 mg total) by mouth every morning. Trenda Frisk, FNP Taking Active   spironolactone  (ALDACTONE ) 25 MG tablet [id number] Yes Take 25 mg by mouth daily. [provider] Taking Active   SUPER B COMPLEX/C PO [id number] Yes Take 1 tablet by mouth daily. [provider] Taking Active Spouse/Significant Other  torsemide  (DEMADEX ) 20 MG tablet [id number] Yes Take 20 mg by mouth daily. [provider] Taking Active Spouse/Significant Other            Recommendation:   PCP Follow-up  Follow Up Plan:   Telephone follow up appointment date/time:  10/08/20 1:15 pm  Jullie Oiler L. Mcarthur Speedy, RN, BSN, CCM [place]  [facility], [facility] RN Care Manager Direct Dial: [phone]  Fax: [phone]

## 2023-09-04 ENCOUNTER — Ambulatory Visit: Payer: Medicare HMO | Admitting: Dermatology

## 2023-09-05 ENCOUNTER — Ambulatory Visit: Admitting: Family

## 2023-09-10 ENCOUNTER — Ambulatory Visit: Admitting: Family

## 2023-09-10 ENCOUNTER — Ambulatory Visit: Payer: HMO | Admitting: Family

## 2023-09-10 ENCOUNTER — Encounter: Payer: Self-pay | Admitting: Family

## 2023-09-10 VITALS — BP 110/40 | HR 68 | Ht 65.0 in | Wt 193.6 lb

## 2023-09-10 DIAGNOSIS — N186 End stage renal disease: Secondary | ICD-10-CM

## 2023-09-10 DIAGNOSIS — E875 Hyperkalemia: Secondary | ICD-10-CM | POA: Diagnosis not present

## 2023-09-10 DIAGNOSIS — E782 Mixed hyperlipidemia: Secondary | ICD-10-CM | POA: Diagnosis not present

## 2023-09-10 DIAGNOSIS — J439 Emphysema, unspecified: Secondary | ICD-10-CM | POA: Diagnosis not present

## 2023-09-10 DIAGNOSIS — I10 Essential (primary) hypertension: Secondary | ICD-10-CM | POA: Diagnosis not present

## 2023-09-10 DIAGNOSIS — I5020 Unspecified systolic (congestive) heart failure: Secondary | ICD-10-CM

## 2023-09-10 NOTE — Progress Notes (Signed)
 Established Patient Office Visit  Subjective:  Patient ID: [name] [name], male    DOB: 02-06-1946  Age: 78 y.o. MRN: [id number]  Chief Complaint  Patient presents with   Hospitalization Follow-up    Patient was recently hospitalized after some issues with his dialysis access.  He was found at that time to have hyperkalemia.  He has a permcath now, and they are resting his dialysis access.   He has been having decreased blood pressure when he goes to dialysis as well, or very high.  He also has been having severe cramps in his legs when he is doing dialysis as well. Concerned that his potassium might still be high, also has a history of hypomagnesemia and hypocalcemia.     No other concerns at this time.   Past Medical History:  Diagnosis Date   Accidental cut, puncture, perforation, or hemorrhage during heart catheterization 12/27/2012   Acute kidney injury superimposed on stage 4 chronic kidney disease (HCC) 07/27/2020   Acute on chronic combined systolic and diastolic CHF (congestive heart failure) (HCC) 04/25/2023   Acute on chronic respiratory failure with hypoxia (HCC) 04/22/2023   Adopted    AKI (acute kidney injury) (HCC) 02/05/2023   ANA positive 07/12/2015   Aortic atherosclerosis (HCC)    Aortic stenosis, severe    a.) s/p TAVR on 08/01/2016: 29 mm CoreValve Evolut placed   Arthritis    Bacteremia due to Streptococcus 04/24/2023   Basal cell carcinoma 01/04/2010   Right sup. post. helix. Excised 03/02/2010   BCC (basal cell carcinoma of skin) 11/24/2020   R neck below the ear, EDC   BPH (benign prostatic hyperplasia)    CAD (coronary artery disease) 12/26/2012   a.) LHC 12/26/2012: 50% mLAD, 80% mLCx, 100% pRCA --> CVTS at [facility] --> decided again revascularization due to insig LAD disease and the fact that RCA lesion was chronic   CAP (community acquired pneumonia) 07/26/2020   Chronic respiratory failure with hypoxia (HCC)    Claudication of both  lower extremities (HCC)    Complete tear of right rotator cuff 11/15/2015   Complex tear of lateral meniscus of left knee as current injury 12/06/2020   Complex tear of medial meniscus of left knee as current injury 10/18/2020   COPD (chronic obstructive pulmonary disease) (HCC)    Diarrhea 02/04/2023   Diet-controlled type 2 diabetes mellitus (HCC)    Diverticulosis    Dyspnea    Electrolyte abnormality 02/04/2023   ESRD (end stage renal disease) (HCC)    GERD (gastroesophageal reflux disease)    Gout    Heart murmur    HFrEF (heart failure with reduced ejection fraction) (HCC)    a.) s/p TAVR on 08/01/2016: 29 mm CoreValve Evolut placed   History of bilateral cataract extraction    History of hiatal hernia    HLD (hyperlipidemia)    Hyperkalemia 07/30/2020   Hypertension    Hypomagnesemia 02/05/2023   Hypothyroidism    IDA (iron deficiency anemia)    Migraines    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   a.) TTE 04/24/2023: moderate (MPG 7.5 mmHG)   OSA on CPAP    Pneumonia 07/2020   Pulmonary hypertension (HCC)    a.) TTE 08/17/2022: mild pHTN per echo report; b.) TTE 04/24/2023: RVSP 39.1   Recurrent nephrolithiasis 12/27/2012   Rotator cuff tendinitis, right 11/15/2015   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 MOHS.   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/[place])     Past Surgical History:  Procedure Laterality Date   AORTIC VALVE REPLACEMENT N/A 08/01/2016   29 mm CoreValve Evolut; Location: [facility]; Surgeon:  Reyes Fruits, MD   AV FISTULA PLACEMENT Right 06/27/2023   Procedure: ARTERIOVENOUS (AV) FISTULA CREATION (BRACHIALCEPHALIC);  Surgeon: Jama Cordella MATSU, MD;  Location: [facility] ORS;  Service: Vascular;  Laterality: Right;   CARDIAC CATHETERIZATION N/A 12/26/2012   2v CAD; retained pigtail in myocardium --> transferred to [facility]; Location: [facility]; Surgeon: Vita Bathe, MD   CATARACT EXTRACTION W/PHACO Left 10/03/2022   Procedure: CATARACT EXTRACTION PHACO AND INTRAOCULAR LENS PLACEMENT (IOC) LEFT DIABETIC 8.22 00:55.5;  Surgeon: Jaye Fallow, MD;  Location: [facility];  Service: Ophthalmology;  Laterality: Left;  sleep apnea   CATARACT EXTRACTION W/PHACO Right 10/17/2022   Procedure: CATARACT EXTRACTION PHACO AND INTRAOCULAR LENS PLACEMENT (IOC) RIGHT DIABETIC;  Surgeon: Jaye Fallow, MD;  Location: [facility];  Service: Ophthalmology;  Laterality: Right;  4.79 0:37.4   COLONOSCOPY     COLONOSCOPY N/A 04/28/2023   Procedure: COLONOSCOPY;  Surgeon: Toledo, Ladell POUR, MD;  Location: [facility] ENDOSCOPY;  Service: Gastroenterology;  Laterality: N/A;   DIALYSIS/PERMA CATHETER INSERTION N/A 08/29/2023   Procedure: DIALYSIS/PERMA CATHETER INSERTION;  Surgeon: Jama Cordella MATSU, MD;  Location: [facility] INV[facility];  Service: Cardiovascular;  Laterality: N/A;   KNEE ARTHROSCOPY WITH MEDIAL MENISECTOMY Left 12/02/2020   Procedure: KNEE ARTHROSCOPY WITH DEBRIDEMENT AND PARTIAL MEDIAL AND LATERAL MENISECTOMY;  Surgeon: Edie Norleen PARAS, MD;  Location: [facility];  Service: Orthopedics;  Laterality: Left;   LITHOTRIPSY     PERCUTANEOUS REMOVAL INTRA-AORTIC BALLOON CATH N/A 12/26/2012   Procedure: PERCUTANEOUS REMOVAL INTRA-AORTIC BALLOON CATH; Surgeon: Lang Lannie Salter, MD; Location: [facility]PERATING RO[facility]; Service: Cardiothoracic   POLYPECTOMY  04/28/2023   Procedure: POLYPECTOMY;  Surgeon: Aundria, Ladell POUR, MD;  Location: [facility];  Service: Gastroenterology;;   RIGHT HEART CATH Right  07/13/2016   Location: [facility]; Surgeon: Ozell Estelle, MD   TEE WITHOUT CARDIOVERSION Right 04/26/2023   Procedure: TRANSESOPHAGEAL ECHOCARDIOGRAM (TEE);  Surgeon: Bathe Denyse LABOR, MD;  Location: [facility];  Service: Cardiovascular;  Laterality: Right;   TRANSESOPHAGEAL ECHOCARDIOGRAM N/A 12/26/2012   Procedure: TRANSESOPHAGEAL ECHOCARDIOGRAPHY; Surgeon: Lang Lannie Salter, MD; Location: [facility]; Service: Cardiothoracic   UMBILICAL HERNIA REPAIR N/A 02/13/2014   Procedure: LAPAROSCOPIC UMBILICAL HERNIA REPAIR; Surgeon: Shanda ONEIDA Salter, MD; Location: [facility]; Service: General Surgery    Social History   Socioeconomic History   Marital status: Married    Spouse name: Cornelious Bartolucci   Number of children: 2   Years of education: Not on file   Highest education level: Not on file  Occupational History   Occupation: retired  Tobacco Use   Smoking status: Former    Current packs/day: 0.00    Average packs/day: 2.0 packs/day for 54.0 years (108.0 ttl pk-yrs)    Types: Cigarettes    Start date: 12    Quit date: 2010    Years since quitting: 15.3   Smokeless tobacco: Former    Types: Sports administrator  Quit date: 05/16/2004  Vaping Use   Vaping status: Never Used  Substance and Sexual Activity   Alcohol use: Yes    Comment: rare beer   Drug use: Never   Sexual activity: Not on file  Other Topics Concern   Not on file  Social History Narrative   Not on file   Social Drivers of Health   Financial Resource Strain: Low Risk  (08/27/2023)   Overall Financial Resource Strain (CARDIA)    Difficulty of Paying Living Expenses: Not very hard  Recent Concern: Financial Resource Strain - High Risk (07/24/2023)   Received from [facility]   Overall Financial Resource Strain (CARDIA)    Difficulty of Paying Living Expenses: Hard  Food Insecurity: No Food Insecurity (08/28/2023)   Hunger Vital Sign    Worried About Running Out of Food in the Last Year: Never true     Ran Out of Food in the Last Year: Never true  Recent Concern: Food Insecurity - Food Insecurity Present (07/12/2023)   Received from [facility]   Hunger Vital Sign    Worried About Running Out of Food in the Last Year: Sometimes true    Ran Out of Food in the Last Year: Sometimes true  Transportation Needs: No Transportation Needs (08/28/2023)   PRAPARE - Administrator, Civil Service (Medical): No    Lack of Transportation (Non-Medical): No  Physical Activity: Inactive (07/18/2023)   Received from [facility]   Exercise Vital Sign    Days of Exercise per Week: 0 days    Minutes of Exercise per Session: 0 min  Stress: No Stress Concern Present (08/27/2023)   [employer] of Occupational Health - Occupational Stress Questionnaire    Feeling of Stress : Only a little  Recent Concern: Stress - Stress Concern Present (07/18/2023)   Received from [facility] of Occupational Health - Occupational Stress Questionnaire    Feeling of Stress : To some extent  Social Connections: Socially Integrated (08/28/2023)   Social Connection and Isolation Panel [NHANES]    Frequency of Communication with Friends and Family: More than three times a week    Frequency of Social Gatherings with Friends and Family: More than three times a week    Attends Religious Services: More than 4 times per year    Active Member of [employer] or Organizations: Yes    Attends Banker Meetings: Never    Marital Status: Married  Catering manager Violence: Not At Risk (08/28/2023)   Humiliation, Afraid, Rape, and Kick questionnaire    Fear of Current or Ex-Partner: No    Emotionally Abused: No    Physically Abused: No    Sexually Abused: No    Family History  Adopted: Yes  Problem Relation Age of Onset   Varicose Veins Daughter    Obesity Daughter    Hyperlipidemia Daughter    Diabetes Daughter    COPD Daughter     Depression Daughter    Asthma Daughter    Arthritis Son    Diabetes Son    Hypertension Son     Allergies  Allergen Reactions   Nsaids Other (See Comments)    Avoid due to kidney disease    Statins Other (See Comments)    Myalgias   Nexletol [Bempedoic Acid] Diarrhea    fatigue   Silver Other (See Comments)   Tegaderm High Gelling Alginate [Wound Dressings] Rash  Review of Systems  Constitutional:  Positive for malaise/fatigue.  Musculoskeletal:  Positive for myalgias.       Leg cramps  All other systems reviewed and are negative.       Objective:   BP (!) 110/40   Pulse 68   Ht 5' 5 (1.651 m)   Wt 193 lb 9.6 oz (87.8 kg)   SpO2 94% Comment: 2L  BMI 32.22 kg/m   Vitals:   09/10/23 1404  BP: (!) 110/40  Pulse: 68  Height: 5' 5 (1.651 m)  Weight: 193 lb 9.6 oz (87.8 kg)  SpO2: 94% Comment: 2L  BMI (Calculated): 32.22    Physical Exam Vitals and nursing note reviewed.  Constitutional:      Appearance: Normal appearance. He is normal weight.  HENT:     Head: Normocephalic and atraumatic.     Nose: Nose normal.  Eyes:     Conjunctiva/sclera: Conjunctivae normal.     Pupils: Pupils are equal, round, and reactive to light.  Cardiovascular:     Rate and Rhythm: Normal rate and regular rhythm.     Pulses: Normal pulses.     Heart sounds: Normal heart sounds.  Pulmonary:     Effort: Pulmonary effort is normal.     Breath sounds: Normal breath sounds.  Neurological:     General: No focal deficit present.     Mental Status: He is alert.     Motor: Weakness present.  Psychiatric:        Mood and Affect: Mood normal.        Behavior: Behavior normal.        Thought Content: Thought content normal.        Judgment: Judgment normal.      No results found for any visits on 09/10/23.  Recent Results (from the past 2160 hours)  Type and screen     Status: None   Collection Time: 06/15/23  2:11 PM  Result Value Ref Range   ABO/RH(D) A POS     Antibody Screen NEG    Sample Expiration 06/29/2023,2359    Extend sample reason      NO TRANSFUSIONS OR PREGNANCY IN THE PAST 3 MONTHS Performed at [facility], [address] [place], [place] [address]   Glucose, capillary     Status: None   Collection Time: 06/27/23 10:01 AM  Result Value Ref Range   Glucose-Capillary 98 70 - 99 mg/dL    Comment: Glucose reference range applies only to samples taken after fasting for at least 8 hours.  ABO/Rh     Status: None   Collection Time: 06/27/23 10:04 AM  Result Value Ref Range   ABO/RH(D)      A POS Performed at [facility], [address] [place], [place] [address]   Glucose, capillary     Status: Abnormal   Collection Time: 06/27/23 12:49 PM  Result Value Ref Range   Glucose-Capillary 113 (H) 70 - 99 mg/dL    Comment: Glucose reference range applies only to samples taken after fasting for at least 8 hours.  Potassium [facility] vascular lab only)     Status: Abnormal   Collection Time: 08/28/23 12:40 PM  Result Value Ref Range   Potassium [facility] vascular lab) 5.7 (H) 3.5 - 5.1 mmol/L    Comment: Performed at [facility], [address] [place], [place] [address]  Basic metabolic panel     Status: Abnormal   Collection Time: 08/28/23  5:38  PM  Result Value Ref Range   Sodium 141 135 - 145 mmol/L   Potassium 5.2 (H) 3.5 - 5.1 mmol/L   Chloride 108 98 - 111 mmol/L   CO2 22 22 - 32 mmol/L   Glucose, Bld 100 (H) 70 - 99 mg/dL    Comment: Glucose reference range applies only to samples taken after fasting for at least 8 hours.   BUN 68 (H) 8 - 23 mg/dL   Creatinine, Ser 5.21 (H) 0.61 - 1.24 mg/dL   Calcium 8.8 (L) 8.9 - 10.3 mg/dL   GFR, Estimated 12 (L) >60 mL/min    Comment: (NOTE) Calculated using the CKD-EPI Creatinine Equation (2021)    Anion gap 11 5 - 15    Comment: Performed at [facility], [address] [place], [place] [address]  Basic metabolic panel with GFR     Status:  Abnormal   Collection Time: 08/29/23  4:44 AM  Result Value Ref Range   Sodium 140 135 - 145 mmol/L   Potassium 4.8 3.5 - 5.1 mmol/L   Chloride 107 98 - 111 mmol/L   CO2 21 (L) 22 - 32 mmol/L   Glucose, Bld 92 70 - 99 mg/dL    Comment: Glucose reference range applies only to samples taken after fasting for at least 8 hours.   BUN 70 (H) 8 - 23 mg/dL   Creatinine, Ser 5.28 (H) 0.61 - 1.24 mg/dL   Calcium 8.7 (L) 8.9 - 10.3 mg/dL   GFR, Estimated 12 (L) >60 mL/min    Comment: (NOTE) Calculated using the CKD-EPI Creatinine Equation (2021)    Anion gap 12 5 - 15    Comment: Performed at [facility], [address] [place], [place] [address]  Hepatitis B surface antigen     Status: None   Collection Time: 08/29/23  7:12 AM  Result Value Ref Range   Hepatitis B Surface Ag NON REACTIVE NON REACTIVE    Comment: Performed at [facility], [address], [place], [place] [address]  Hepatitis B surface antibody,quantitative     Status: Abnormal   Collection Time: 08/29/23  7:12 AM  Result Value Ref Range   Hep B S AB Quant (Post) <3.5 (L) Immunity>10 mIU/mL    Comment: (NOTE)  Status of Immunity                     Anti-HBs Level  ------------------                     -------------- Inconsistent with Immunity                  0.0 - 10.0 Consistent with Immunity                         >10.0 Performed At: [facility] [address] [place], [place] [id number] Jennette Shorter MD Ey:[id number]   CBC     Status: Abnormal   Collection Time: 08/29/23  7:12 AM  Result Value Ref Range   WBC 7.3 4.0 - 10.5 K/uL   RBC 3.42 (L) 4.22 - 5.81 MIL/uL   Hemoglobin 11.1 (L) 13.0 - 17.0 g/dL   HCT 65.9 (L) 60.9 - 47.9 %   MCV 99.4 80.0 - 100.0 fL   MCH 32.5 26.0 - 34.0 pg   MCHC 32.6 30.0 - 36.0 g/dL   RDW 86.4 88.4 - 84.4 %   Platelets  152 150 - 400 K/uL   nRBC 0.0 0.0 - 0.2 %    Comment: Performed at [facility], [address] [place], [place] [address]        Assessment & Plan Hyperkalemia Hypomagnesemia Hypocalcemia Checking labs today.  Holding Spironolactone .   Will continue supplements as needed.   Essential hypertension Blood pressure well controlled with current medications.  Continue current therapy.  Will reassess at follow up.   Pulmonary emphysema, unspecified emphysema type (HCC) Patient is seen by pulmonary, who manage this condition.  He is well controlled with current therapy.   Will defer to them for further changes to plan of care.  HFrEF (heart failure with reduced ejection fraction) (HCC) Patient is seen by cardiology, who manage this condition.  He is well controlled with current therapy.   Will defer to them for further changes to plan of care.  Mixed hyperlipidemia Checking labs today.  Continue current therapy for lipid control. Will modify as needed based on labwork results.   ESRD (end stage renal disease) (HCC) Patient is seen by nephrology, who manage this condition.  He is well controlled with current therapy.   Will defer to them for further changes to plan of care.    Return as scheduled unless not improving.   Total time spent: 20 minutes  ALAN CHRISTELLA ARRANT, FNP  09/10/2023   This document may have been prepared by [facility] Voice Recognition software and as such may include unintentional dictation errors.

## 2023-09-10 NOTE — Patient Instructions (Signed)
 Hold the Spironolactone  - this could be causing his potassium to go up, and can also drop his blood pressure.  We will recheck at appointment next week.

## 2023-09-11 ENCOUNTER — Encounter: Payer: Self-pay | Admitting: Cardiovascular Disease

## 2023-09-11 ENCOUNTER — Encounter (INDEPENDENT_AMBULATORY_CARE_PROVIDER_SITE_OTHER): Payer: Self-pay

## 2023-09-11 ENCOUNTER — Ambulatory Visit: Admitting: Dermatology

## 2023-09-11 ENCOUNTER — Encounter: Payer: Self-pay | Admitting: Dermatology

## 2023-09-11 ENCOUNTER — Ambulatory Visit: Admitting: Cardiovascular Disease

## 2023-09-11 VITALS — BP 115/52 | HR 65 | Ht 63.0 in | Wt 192.0 lb

## 2023-09-11 DIAGNOSIS — L578 Other skin changes due to chronic exposure to nonionizing radiation: Secondary | ICD-10-CM

## 2023-09-11 DIAGNOSIS — Z1283 Encounter for screening for malignant neoplasm of skin: Secondary | ICD-10-CM | POA: Diagnosis not present

## 2023-09-11 DIAGNOSIS — R0602 Shortness of breath: Secondary | ICD-10-CM | POA: Diagnosis not present

## 2023-09-11 DIAGNOSIS — L814 Other melanin hyperpigmentation: Secondary | ICD-10-CM | POA: Diagnosis not present

## 2023-09-11 DIAGNOSIS — D1801 Hemangioma of skin and subcutaneous tissue: Secondary | ICD-10-CM

## 2023-09-11 DIAGNOSIS — I25118 Atherosclerotic heart disease of native coronary artery with other forms of angina pectoris: Secondary | ICD-10-CM | POA: Diagnosis not present

## 2023-09-11 DIAGNOSIS — L570 Actinic keratosis: Secondary | ICD-10-CM | POA: Diagnosis not present

## 2023-09-11 DIAGNOSIS — Z85828 Personal history of other malignant neoplasm of skin: Secondary | ICD-10-CM | POA: Diagnosis not present

## 2023-09-11 DIAGNOSIS — D692 Other nonthrombocytopenic purpura: Secondary | ICD-10-CM

## 2023-09-11 DIAGNOSIS — Z86007 Personal history of in-situ neoplasm of skin: Secondary | ICD-10-CM

## 2023-09-11 DIAGNOSIS — I5020 Unspecified systolic (congestive) heart failure: Secondary | ICD-10-CM

## 2023-09-11 DIAGNOSIS — I10 Essential (primary) hypertension: Secondary | ICD-10-CM | POA: Diagnosis not present

## 2023-09-11 DIAGNOSIS — N186 End stage renal disease: Secondary | ICD-10-CM | POA: Diagnosis not present

## 2023-09-11 DIAGNOSIS — L821 Other seborrheic keratosis: Secondary | ICD-10-CM | POA: Diagnosis not present

## 2023-09-11 DIAGNOSIS — W908XXA Exposure to other nonionizing radiation, initial encounter: Secondary | ICD-10-CM

## 2023-09-11 DIAGNOSIS — D229 Melanocytic nevi, unspecified: Secondary | ICD-10-CM

## 2023-09-11 DIAGNOSIS — Z9981 Dependence on supplemental oxygen: Secondary | ICD-10-CM

## 2023-09-11 DIAGNOSIS — Z952 Presence of prosthetic heart valve: Secondary | ICD-10-CM | POA: Diagnosis not present

## 2023-09-11 LAB — CMP14+EGFR
ALT: 5 IU/L (ref 0–44)
AST: 15 IU/L (ref 0–40)
Albumin: 3.8 g/dL (ref 3.8–4.8)
Alkaline Phosphatase: 89 IU/L (ref 44–121)
BUN/Creatinine Ratio: 7 — ABNORMAL LOW (ref 10–24)
BUN: 20 mg/dL (ref 8–27)
Bilirubin Total: 0.4 mg/dL (ref 0.0–1.2)
CO2: 24 mmol/L (ref 20–29)
Calcium: 8.6 mg/dL (ref 8.6–10.2)
Chloride: 101 mmol/L (ref 96–106)
Creatinine, Ser: 2.95 mg/dL — ABNORMAL HIGH (ref 0.76–1.27)
Globulin, Total: 2.8 g/dL (ref 1.5–4.5)
Glucose: 97 mg/dL (ref 70–99)
Potassium: 3.8 mmol/L (ref 3.5–5.2)
Sodium: 141 mmol/L (ref 134–144)
Total Protein: 6.6 g/dL (ref 6.0–8.5)
eGFR: 21 mL/min/{1.73_m2} — ABNORMAL LOW (ref >59)

## 2023-09-11 LAB — MAGNESIUM: Magnesium: 1.7 mg/dL (ref 1.6–2.3)

## 2023-09-11 NOTE — Progress Notes (Signed)
 Follow-Up Visit   Subjective  [name] [name] is a 78 y.o. male who presents for the following: Skin Cancer Screening and Upper Body Skin Exam  The patient presents for Upper Body Skin Exam (UBSE) for skin cancer screening and mole check. The patient has spots, moles and lesions to be evaluated, some may be new or changing and the patient may have concern these could be cancer.  Recently started dialysis 3 wks ago.   The following portions of the chart were reviewed this encounter and updated as appropriate: medications, allergies, medical history  Review of Systems:  No other skin or systemic complaints except as noted in HPI or Assessment and Plan.  Objective  Well appearing patient in no apparent distress; mood and affect are within normal limits.  All skin waist up examined. Relevant physical exam findings are noted in the Assessment and Plan.  scalp face neck dorsal hands forearms Erythematous thin papules/macules with gritty scale.   Assessment & Plan   AK (ACTINIC KERATOSIS) scalp face neck dorsal hands forearms Actinic keratoses are precancerous spots that appear secondary to cumulative UV radiation exposure/sun exposure over time. They are chronic with expected duration over 1 year. A portion of actinic keratoses will progress to squamous cell carcinoma of the skin. It is not possible to reliably predict which spots will progress to skin cancer and so treatment is recommended to prevent development of skin cancer.  Recommend daily broad spectrum sunscreen SPF 30+ to sun-exposed areas, reapply every 2 hours as needed.  Recommend staying in the shade or wearing long sleeves, sun glasses (UVA+UVB protection) and wide brim hats (4-inch brim around the entire circumference of the hat). Call for new or changing lesions.   Defer treatment for now due to complicated medical issues. Plan 5FU if patient doing well at follow up appointment. RTC if any lesions start growing bleeding  hurting or otherwise changing LENTIGINES   MULTIPLE BENIGN NEVI   ACTINIC ELASTOSIS   SEBORRHEIC KERATOSES   CHERRY ANGIOMA   SOLAR PURPURA (HCC)    Actinic Damage - Chronic condition, secondary to cumulative UV/sun exposure - diffuse scaly erythematous macules with underlying dyspigmentation - Recommend daily broad spectrum sunscreen SPF 30+ to sun-exposed areas, reapply every 2 hours as needed.  - Staying in the shade or wearing long sleeves, sun glasses (UVA+UVB protection) and wide brim hats (4-inch brim around the entire circumference of the hat) are also recommended for sun protection.  - Call for new or changing lesions.  Lentigines, Seborrheic Keratoses, Hemangiomas - Benign normal skin lesions - Benign-appearing - Call for any changes  Melanocytic Nevi - Tan-brown and/or pink-flesh-colored symmetric macules and papules - Benign appearing on exam today - Observation - Call clinic for new or changing moles - Recommend daily use of broad spectrum spf 30+ sunscreen to sun-exposed areas.   Purpura - Chronic; persistent and recurrent.  Treatable, but not curable. - Violaceous macules and patches - Benign - Related to trauma, age, sun damage and/or use of blood thinners, chronic use of topical and/or oral steroids - Observe - Can use OTC arnica containing moisturizer such as Dermend Bruise Formula if desired - Call for worsening or other concerns  HISTORY OF BASAL CELL CARCINOMA OF THE SKIN - No evidence of recurrence today - Recommend regular full body skin exams - Recommend daily broad spectrum sunscreen SPF 30+ to sun-exposed areas, reapply every 2 hours as needed.  - Call if any new or changing lesions are noted between office  visits  HISTORY OF SQUAMOUS CELL CARCINOMA OF THE SKIN - L sup forehead - No evidence of recurrence today - No lymphadenopathy - Recommend regular full body skin exams - Recommend daily broad spectrum sunscreen SPF 30+ to  sun-exposed areas, reapply every 2 hours as needed.  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  - Call if any new or changing lesions are noted between office visits  Skin cancer screening performed today.   Return in about 6 months (around 03/13/2024) for UBSE.  Arlinda Lais, CMA, am acting as scribe for Harris Liming, MD .   Documentation: I have reviewed the above documentation for accuracy and completeness, and I agree with the above.  Harris Liming, MD

## 2023-09-11 NOTE — Patient Instructions (Signed)

## 2023-09-11 NOTE — Progress Notes (Signed)
 Cardiology Office Note   Date:  09/11/2023   ID:  [name] [name] 1945/11/17, MRN [id number]  PCP:  Trenda Frisk, FNP  Cardiologist:  Debborah Fairly, MD      History of Present Illness: [name] [name] is a 78 y.o. male who presents for  Chief Complaint  Patient presents with   Follow-up    Follow Up    Has been SOB, just going to take trash out. He was in [facility] and had HD started.      Past Medical History:  Diagnosis Date   Accidental cut, puncture, perforation, or hemorrhage during heart catheterization 12/27/2012   Acute kidney injury superimposed on stage 4 chronic kidney disease (HCC) 07/27/2020   Acute on chronic combined systolic and diastolic CHF (congestive heart failure) (HCC) 04/25/2023   Acute on chronic respiratory failure with hypoxia (HCC) 04/22/2023   Adopted    AKI (acute kidney injury) (HCC) 02/05/2023   ANA positive 07/12/2015   Aortic atherosclerosis (HCC)    Aortic stenosis, severe    a.) s/p TAVR on 08/01/2016: 29 mm CoreValve Evolut placed   Arthritis    Bacteremia due to Streptococcus 04/24/2023   Basal cell carcinoma 01/04/2010   Right sup. post. helix. Excised 03/02/2010   BCC (basal cell carcinoma of skin) 11/24/2020   R neck below the ear, EDC   BPH (benign prostatic hyperplasia)    CAD (coronary artery disease) 12/26/2012   a.) LHC 12/26/2012: 50% mLAD, 80% mLCx, 100% pRCA --> CVTS at [facility] --> decided again revascularization due to insig LAD disease and the fact that RCA lesion was chronic   CAP (community acquired pneumonia) 07/26/2020   Chronic respiratory failure with hypoxia (HCC)    Claudication of both lower extremities (HCC)    Complete tear of right rotator cuff 11/15/2015   Complex tear of lateral meniscus of left knee as current injury 12/06/2020   Complex tear of medial meniscus of left knee as current injury 10/18/2020   COPD (chronic obstructive pulmonary disease) (HCC)    Diarrhea 02/04/2023    Diet-controlled type 2 diabetes mellitus (HCC)    Diverticulosis    Dyspnea    Electrolyte abnormality 02/04/2023   ESRD (end stage renal disease) (HCC)    GERD (gastroesophageal reflux disease)    Gout    Heart murmur    HFrEF (heart failure with reduced ejection fraction) (HCC)    a.) s/p TAVR on 08/01/2016: 29 mm CoreValve Evolut placed   History of bilateral cataract extraction    History of hiatal hernia    HLD (hyperlipidemia)    Hyperkalemia 07/30/2020   Hypertension    Hypomagnesemia 02/05/2023   Hypothyroidism    IDA (iron deficiency anemia)    Migraines    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   a.) TTE 04/24/2023: moderate (MPG 7.5 mmHG)   OSA on CPAP    Pneumonia 07/2020   Pulmonary hypertension (HCC)    a.) TTE 08/17/2022: "mild pHTN" per echo report; b.) TTE 04/24/2023: RVSP 39.1   Recurrent nephrolithiasis 12/27/2012   Rotator cuff tendinitis, right 11/15/2015   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 MOHS.   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/[place])      Past Surgical History:  Procedure Laterality Date   AORTIC VALVE REPLACEMENT N/A 08/01/2016   29 mm CoreValve Evolut; Location: [facility]; Surgeon: Forbes Ida, MD   AV FISTULA PLACEMENT Right 06/27/2023   Procedure: ARTERIOVENOUS (AV) FISTULA CREATION (BRACHIALCEPHALIC);  Surgeon: Jackquelyn Mass, MD;  Location: [facility];  Service: Vascular;  Laterality: Right;   CARDIAC CATHETERIZATION N/A 12/26/2012   2v CAD; retained pigtail in myocardium -->  transferred to [facility]; Location: [facility]; Surgeon: Rinda Cheers, MD   CATARACT EXTRACTION W/PHACO Left 10/03/2022   Procedure: CATARACT EXTRACTION PHACO AND INTRAOCULAR LENS PLACEMENT (IOC) LEFT DIABETIC 8.22 00:55.5;  Surgeon: Clair Crews, MD;  Location: [facility];  Service: Ophthalmology;  Laterality: Left;  sleep apnea   CATARACT EXTRACTION W/PHACO Right 10/17/2022   Procedure: CATARACT EXTRACTION PHACO AND INTRAOCULAR LENS PLACEMENT (IOC) RIGHT DIABETIC;  Surgeon: Clair Crews, MD;  Location: [facility];  Service: Ophthalmology;  Laterality: Right;  4.79 0:37.4   COLONOSCOPY     COLONOSCOPY N/A 04/28/2023   Procedure: COLONOSCOPY;  Surgeon: Toledo, Alphonsus Jeans, MD;  Location: [facility];  Service: Gastroenterology;  Laterality: N/A;   DIALYSIS/PERMA CATHETER INSERTION N/A 08/29/2023   Procedure: DIALYSIS/PERMA CATHETER INSERTION;  Surgeon: Jackquelyn Mass, MD;  Location: [facility];  Service: Cardiovascular;  Laterality: N/A;   KNEE ARTHROSCOPY WITH MEDIAL MENISECTOMY Left 12/02/2020   Procedure: KNEE ARTHROSCOPY WITH DEBRIDEMENT AND PARTIAL MEDIAL AND LATERAL MENISECTOMY;  Surgeon: Elner Hahn, MD;  Location: [facility];  Service: Orthopedics;  Laterality: Left;   LITHOTRIPSY     PERCUTANEOUS REMOVAL INTRA-AORTIC BALLOON CATH N/A 12/26/2012   Procedure: PERCUTANEOUS REMOVAL INTRA-AORTIC BALLOON CATH; Surgeon: Erik Havens, MD; Location: [facility]; Service: Cardiothoracic   POLYPECTOMY  04/28/2023   Procedure: POLYPECTOMY;  Surgeon: Corky Diener, Alphonsus Jeans, MD;  Location: [name];  Service: Gastroenterology;;   RIGHT HEART CATH Right 07/13/2016   Location: [facility]; Surgeon: Toula French, MD   TEE WITHOUT CARDIOVERSION Right 04/26/2023   Procedure: TRANSESOPHAGEAL ECHOCARDIOGRAM (TEE);  Surgeon: Cherrie Cornwall, MD;  Location: [facility];  Service: Cardiovascular;  Laterality: Right;   TRANSESOPHAGEAL ECHOCARDIOGRAM N/A 12/26/2012   Procedure:  TRANSESOPHAGEAL ECHOCARDIOGRAPHY; Surgeon: Erik Havens, MD; Location: [facility]; Service: Cardiothoracic   UMBILICAL HERNIA REPAIR N/A 02/13/2014   Procedure: LAPAROSCOPIC UMBILICAL HERNIA REPAIR; Surgeon: Oma Bias, MD; Location: [facility]; Service: General Surgery     Current Outpatient Medications  Medication Sig Dispense Refill   acetaminophen  (TYLENOL ) 650 MG CR tablet Take 1,300 mg by mouth every 8 (eight) hours as needed for pain.     albuterol  (VENTOLIN  HFA) 108 (90 Base) MCG/ACT inhaler Inhale 2 puffs into the lungs every 6 (six) hours as needed for wheezing or shortness of breath.     Alcohol Swabs (DROPSAFE ALCOHOL PREP) 70 % PADS Apply 1 each topically as needed (With repatha and blood sugar checks.).     Ascorbic Acid  (VITAMIN C ) 1000 MG tablet Take 1,000 mg by mouth at bedtime.     busPIRone  (BUSPAR ) 5 MG tablet TAKE 1 TABLET BY MOUTH TWICE DAILY AS NEEDED (Patient taking differently: Take 5 mg by mouth at bedtime.) 60 tablet 3   calcitRIOL  (ROCALTROL ) 0.25 MCG capsule Take 0.25 mcg by mouth daily.     citalopram  (CELEXA )  40 MG tablet TAKE 1 TABLET EVERY DAY (Patient taking differently: Take 40 mg by mouth every morning.) 90 tablet 3   colchicine  0.6 MG tablet Take 0.3 mg by mouth at bedtime. Taking as needed only     Evolocumab 140 MG/ML SOAJ Inject 140 mg into the skin every 14 (fourteen) days.     ezetimibe  (ZETIA ) 10 MG tablet Take 1 tablet (10 mg total) by mouth at bedtime. 90 tablet 1   febuxostat  (ULORIC ) 40 MG tablet Take 40 mg by mouth at bedtime.     fexofenadine (ALLEGRA) 180 MG tablet Take 180 mg by mouth daily as needed for allergies or rhinitis.     Fluticasone -Umeclidin-Vilant (TRELEGY ELLIPTA ) 100-62.5-25 MCG/ACT AEPB Inhale 1 Inhalation into the lungs daily at 6 (six) AM. (Patient taking differently: Inhale 1 Inhalation into the lungs daily as needed (shortness of breath).) 60 each 11   gabapentin  (NEURONTIN ) 100 MG capsule TAKE 2  CAPSULES TWICE DAILY 360 capsule 3   gabapentin  (NEURONTIN ) 400 MG capsule TAKE 1 CAPSULE AT BEDTIME 90 capsule 3   metoprolol  succinate (TOPROL -XL) 50 MG 24 hr tablet TAKE 1 TABLET EVERY DAY (Patient taking differently: Take 50 mg by mouth every morning.) 90 tablet 3   Multiple Vitamins-Minerals (PRESERVISION AREDS) CAPS Take 1 capsule by mouth 2 (two) times daily.     OXYGEN  Inhale 2-3 L into the lungs See admin instructions. Used as needed throughout the day and continuous at bedtime     pantoprazole  (PROTONIX ) 40 MG tablet Take 1 tablet (40 mg total) by mouth every morning. 90 tablet 3   spironolactone  (ALDACTONE ) 25 MG tablet Take 25 mg by mouth daily.     SUPER B COMPLEX/C PO Take 1 tablet by mouth daily.     torsemide  (DEMADEX ) 20 MG tablet Take 20 mg by mouth daily.     No current facility-administered medications for this visit.    Allergies:   Nsaids, Statins, Nexletol [bempedoic acid], Silver, and Tegaderm high gelling alginate [wound dressings]    Social History:   reports that he quit smoking about 15 years ago. His smoking use included cigarettes. He started smoking about 69 years ago. He has a 108 pack-year smoking history. He quit smokeless tobacco use about 19 years ago.  His smokeless tobacco use included chew. He reports current alcohol use. He reports that he does not use drugs.   Family History:  family history includes Arthritis in his son; Asthma in his daughter; COPD in his daughter; Depression in his daughter; Diabetes in his daughter and son; Hyperlipidemia in his daughter; Hypertension in his son; Obesity in his daughter; Varicose Veins in his daughter. He was adopted.    ROS:     Review of Systems  Constitutional: Negative.   HENT: Negative.    Eyes: Negative.   Respiratory: Negative.    Gastrointestinal: Negative.   Genitourinary: Negative.   Musculoskeletal: Negative.   Skin: Negative.   Neurological: Negative.   Endo/Heme/Allergies: Negative.    Psychiatric/Behavioral: Negative.    All other systems reviewed and are negative.     All other systems are reviewed and negative.    PHYSICAL EXAM: VS:  BP (!) 115/52   Pulse 65   Ht 5\' 3"  (1.6 m)   Wt 192 lb (87.1 kg)   SpO2 95%   BMI 34.01 kg/m  , BMI Body mass index is 34.01 kg/m. Last weight:  Wt Readings from Last 3 Encounters:  09/11/23 192 lb (87.1 kg)  09/10/23  193 lb 9.6 oz (87.8 kg)  08/29/23 187 lb 6.3 oz (85 kg)     Physical Exam Vitals reviewed.  Constitutional:      Appearance: Normal appearance. He is normal weight.  HENT:     Head: Normocephalic.     Nose: Nose normal.     Mouth/Throat:     Mouth: Mucous membranes are moist.  Eyes:     Pupils: Pupils are equal, round, and reactive to light.  Cardiovascular:     Rate and Rhythm: Normal rate and regular rhythm.     Pulses: Normal pulses.     Heart sounds: Normal heart sounds.  Pulmonary:     Effort: Pulmonary effort is normal.  Abdominal:     General: Abdomen is flat. Bowel sounds are normal.  Musculoskeletal:        General: Normal range of motion.     Cervical back: Normal range of motion.  Skin:    General: Skin is warm.  Neurological:     General: No focal deficit present.     Mental Status: He is alert.  Psychiatric:        Mood and Affect: Mood normal.       EKG:   Recent Labs: 03/06/2023: TSH 4.600 04/21/2023: B Natriuretic Peptide 820.9 08/29/2023: Hemoglobin 11.1; Platelets 152 09/10/2023: ALT 5; BUN 20; Creatinine, Ser 2.95; Magnesium  1.7; Potassium 3.8; Sodium 141    Lipid Panel    Component Value Date/Time   CHOL 84 (L) 03/06/2023 0942   CHOL 100 07/26/2012 0111   TRIG 164 (H) 03/06/2023 0942   TRIG 157 07/26/2012 0111   HDL 33 (L) 03/06/2023 0942   HDL 35 (L) 07/26/2012 0111   CHOLHDL 2.5 03/06/2023 0942   VLDL 31 07/26/2012 0111   LDLCALC 24 03/06/2023 0942   LDLCALC 34 07/26/2012 0111      Other studies Reviewed: Additional studies/ records that were  reviewed today include:  Review of the above records demonstrates:       No data to display            ASSESSMENT AND PLAN:    ICD-10-CM   1. S/P TAVR (transcatheter aortic valve replacement)  Z95.2    will check echo    2. Essential hypertension  I10     3. HFrEF (heart failure with reduced ejection fraction) (HCC)  I50.20     4. ESRD (end stage renal disease) (HCC)  N18.6     5. On home oxygen  therapy  Z99.81     6. SOB (shortness of breath)  R06.02    LVEF 40-45 % in 1/25, repeat echo    7. Coronary artery disease involving native coronary artery of native heart with other form of angina pectoris (HCC)  I25.118        Problem List Items Addressed This Visit       Cardiovascular and Mediastinum   Essential hypertension   Coronary artery disease   HFrEF (heart failure with reduced ejection fraction) (HCC)     Genitourinary   ESRD (end stage renal disease) (HCC)     Other   On home oxygen  therapy   Other Visit Diagnoses       S/P TAVR (transcatheter aortic valve replacement)    -  Primary   will check echo     SOB (shortness of breath)       LVEF 40-45 % in 1/25, repeat echo          Disposition:  No follow-ups on file.    Total time spent: 30 minutes  Signed,  Debborah Fairly, MD  09/11/2023 1:54 PM    [facility]

## 2023-09-13 ENCOUNTER — Ambulatory Visit: Payer: Self-pay

## 2023-09-18 ENCOUNTER — Ambulatory Visit (INDEPENDENT_AMBULATORY_CARE_PROVIDER_SITE_OTHER): Admitting: Family

## 2023-09-18 ENCOUNTER — Encounter: Payer: Self-pay | Admitting: Family

## 2023-09-18 VITALS — BP 126/50 | HR 55 | Ht 65.0 in | Wt 196.8 lb

## 2023-09-18 DIAGNOSIS — E669 Obesity, unspecified: Secondary | ICD-10-CM

## 2023-09-18 DIAGNOSIS — E782 Mixed hyperlipidemia: Secondary | ICD-10-CM

## 2023-09-18 DIAGNOSIS — Z992 Dependence on renal dialysis: Secondary | ICD-10-CM

## 2023-09-18 DIAGNOSIS — E1122 Type 2 diabetes mellitus with diabetic chronic kidney disease: Secondary | ICD-10-CM

## 2023-09-18 DIAGNOSIS — N186 End stage renal disease: Secondary | ICD-10-CM | POA: Diagnosis not present

## 2023-09-18 DIAGNOSIS — I5020 Unspecified systolic (congestive) heart failure: Secondary | ICD-10-CM | POA: Diagnosis not present

## 2023-09-18 DIAGNOSIS — I10 Essential (primary) hypertension: Secondary | ICD-10-CM | POA: Diagnosis not present

## 2023-09-18 NOTE — Progress Notes (Signed)
 Established Patient Office Visit  Subjective:  Patient ID: [name] [name], male    DOB: 1945-05-04  Age: 78 y.o. MRN: [id number]  Chief Complaint  Patient presents with   Follow-up    3 month follow up    Patient is here today for his 3 months follow up.  He has been feeling slightly better, but still fatigued and worn out since last appointment.   He does not have additional concerns to discuss today.  Labs are due today. He needs refills.   I have reviewed his active problem list, medication list, allergies, notes from last encounter, lab results for his appointment today.      No other concerns at this time.   Past Medical History:  Diagnosis Date   Accidental cut, puncture, perforation, or hemorrhage during heart catheterization 12/27/2012   Acute kidney injury superimposed on stage 4 chronic kidney disease (HCC) 07/27/2020   Acute on chronic combined systolic and diastolic CHF (congestive heart failure) (HCC) 04/25/2023   Acute on chronic respiratory failure with hypoxia (HCC) 04/22/2023   Adopted    AKI (acute kidney injury) (HCC) 02/05/2023   ANA positive 07/12/2015   Aortic atherosclerosis (HCC)    Aortic stenosis, severe    a.) s/p TAVR on 08/01/2016: 29 mm CoreValve Evolut placed   Arthritis    Bacteremia due to Streptococcus 04/24/2023   Basal cell carcinoma 01/04/2010   Right sup. post. helix. Excised 03/02/2010   BCC (basal cell carcinoma of skin) 11/24/2020   R neck below the ear, EDC   BPH (benign prostatic hyperplasia)    CAD (coronary artery disease) 12/26/2012   a.) LHC 12/26/2012: 50% mLAD, 80% mLCx, 100% pRCA --> CVTS at [facility] --> decided again revascularization due to insig LAD disease and the fact that RCA lesion was chronic   CAP (community acquired pneumonia) 07/26/2020   Chronic respiratory failure with hypoxia (HCC)    Claudication of both lower extremities (HCC)    Complete tear of right rotator cuff 11/15/2015   Complex tear of  lateral meniscus of left knee as current injury 12/06/2020   Complex tear of medial meniscus of left knee as current injury 10/18/2020   COPD (chronic obstructive pulmonary disease) (HCC)    Diarrhea 02/04/2023   Diet-controlled type 2 diabetes mellitus (HCC)    Diverticulosis    Dyspnea    Electrolyte abnormality 02/04/2023   ESRD (end stage renal disease) (HCC)    GERD (gastroesophageal reflux disease)    Gout    Heart murmur    HFrEF (heart failure with reduced ejection fraction) (HCC)    a.) s/p TAVR on 08/01/2016: 29 mm CoreValve Evolut placed   History of bilateral cataract extraction    History of hiatal hernia    HLD (hyperlipidemia)    Hyperkalemia 07/30/2020   Hypertension    Hypomagnesemia 02/05/2023   Hypothyroidism    IDA (iron deficiency anemia)    Migraines    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   a.) TTE 04/24/2023: moderate (MPG 7.5 mmHG)   OSA on CPAP    Pneumonia 07/2020   Pulmonary hypertension (HCC)    a.) TTE 08/17/2022: mild pHTN per echo report; b.) TTE 04/24/2023: RVSP 39.1   Recurrent nephrolithiasis 12/27/2012   Rotator cuff tendinitis, right 11/15/2015   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 MOHS.   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/)     Past Surgical History:  Procedure Laterality Date   AORTIC VALVE REPLACEMENT N/A 08/01/2016   29 mm CoreValve Evolut; Location: [facility]; Surgeon: Reyes Fruits, MD   AV FISTULA PLACEMENT Right 06/27/2023   Procedure: ARTERIOVENOUS (AV) FISTULA  CREATION (BRACHIALCEPHALIC);  Surgeon: Jama Cordella MATSU, MD;  Location: [facility];  Service: Vascular;  Laterality: Right;   CARDIAC CATHETERIZATION N/A 12/26/2012   2v CAD; retained pigtail in myocardium --> transferred to [facility]; Location: [facility]; Surgeon: Vita Bathe, MD   CATARACT EXTRACTION W/PHACO Left 10/03/2022   Procedure: CATARACT EXTRACTION PHACO AND INTRAOCULAR LENS PLACEMENT (IOC) LEFT DIABETIC 8.22 00:55.5;  Surgeon: Jaye Fallow, MD;  Location: [facility];  Service: Ophthalmology;  Laterality: Left;  sleep apnea   CATARACT EXTRACTION W/PHACO Right 10/17/2022   Procedure: CATARACT EXTRACTION PHACO AND INTRAOCULAR LENS PLACEMENT (IOC) RIGHT DIABETIC;  Surgeon: Jaye Fallow, MD;  Location: [facility];  Service: Ophthalmology;  Laterality: Right;  4.79 0:37.4   COLONOSCOPY     COLONOSCOPY N/A 04/28/2023   Procedure: COLONOSCOPY;  Surgeon: Toledo, Ladell POUR, MD;  Location: [facility];  Service: Gastroenterology;  Laterality: N/A;   DIALYSIS/PERMA CATHETER INSERTION N/A 08/29/2023   Procedure: DIALYSIS/PERMA CATHETER INSERTION;  Surgeon: Jama Cordella MATSU, MD;  Location: [facility];  Service: Cardiovascular;  Laterality: N/A;   KNEE ARTHROSCOPY WITH MEDIAL MENISECTOMY Left 12/02/2020   Procedure: KNEE ARTHROSCOPY WITH DEBRIDEMENT AND PARTIAL MEDIAL AND LATERAL MENISECTOMY;  Surgeon: Edie Norleen PARAS, MD;  Location: [facility];  Service: Orthopedics;  Laterality: Left;   LITHOTRIPSY     PERCUTANEOUS REMOVAL INTRA-AORTIC BALLOON CATH N/A 12/26/2012   Procedure: PERCUTANEOUS REMOVAL INTRA-AORTIC BALLOON CATH; Surgeon: Lang Lannie Salter, MD; Location: [facility]; Service: Cardiothoracic   POLYPECTOMY  04/28/2023   Procedure: POLYPECTOMY;  Surgeon: Aundria, Ladell POUR, MD;  Location: [facility];  Service: Gastroenterology;;   RIGHT HEART CATH Right 07/13/2016   Location: [facility]; Surgeon: Ozell Estelle, MD   TEE WITHOUT CARDIOVERSION Right 04/26/2023    Procedure: TRANSESOPHAGEAL ECHOCARDIOGRAM (TEE);  Surgeon: Bathe Denyse LABOR, MD;  Location: [facility];  Service: Cardiovascular;  Laterality: Right;   TRANSESOPHAGEAL ECHOCARDIOGRAM N/A 12/26/2012   Procedure: TRANSESOPHAGEAL ECHOCARDIOGRAPHY; Surgeon: Lang Lannie Salter, MD; Location: [facility]; Service: Cardiothoracic   UMBILICAL HERNIA REPAIR N/A 02/13/2014   Procedure: LAPAROSCOPIC UMBILICAL HERNIA REPAIR; Surgeon: Shanda ONEIDA Salter, MD; Location: [facility]; Service: General Surgery    Social History   Socioeconomic History   Marital status: Married    Spouse name: Maxson Oddo   Number of children: 2   Years of education: Not on file   Highest education level: Not on file  Occupational History   Occupation: retired  Tobacco Use   Smoking status: Former    Current packs/day: 0.00    Average packs/day: 2.0 packs/day for 54.0 years (108.0 ttl pk-yrs)    Types: Cigarettes    Start date: 72    Quit date: 2010    Years since quitting: 15.4   Smokeless tobacco: Former    Types: Chew    Quit date: 05/16/2004  Vaping Use   Vaping status:  Never Used  Substance and Sexual Activity   Alcohol use: Yes    Comment: rare beer   Drug use: Never   Sexual activity: Not on file  Other Topics Concern   Not on file  Social History Narrative   Not on file   Social Drivers of Health   Financial Resource Strain: Low Risk  (08/27/2023)   Overall Financial Resource Strain (CARDIA)    Difficulty of Paying Living Expenses: Not very hard  Recent Concern: Financial Resource Strain - High Risk (07/24/2023)   Received from [facility]   Overall Financial Resource Strain (CARDIA)    Difficulty of Paying Living Expenses: Hard  Food Insecurity: No Food Insecurity (08/28/2023)   Hunger Vital Sign    Worried About Running Out of Food in the Last Year: Never true    Ran Out of Food in the Last Year: Never true  Recent Concern: Food Insecurity - Food Insecurity  Present (07/12/2023)   Received from [facility]   Hunger Vital Sign    Worried About Running Out of Food in the Last Year: Sometimes true    Ran Out of Food in the Last Year: Sometimes true  Transportation Needs: No Transportation Needs (08/28/2023)   PRAPARE - Administrator, Civil Service (Medical): No    Lack of Transportation (Non-Medical): No  Physical Activity: Inactive (07/18/2023)   Received from [facility]   Exercise Vital Sign    Days of Exercise per Week: 0 days    Minutes of Exercise per Session: 0 min  Stress: No Stress Concern Present (08/27/2023)   [employer] of Occupational Health - Occupational Stress Questionnaire    Feeling of Stress : Only a little  Recent Concern: Stress - Stress Concern Present (07/18/2023)   Received from [facility] of Occupational Health - Occupational Stress Questionnaire    Feeling of Stress : To some extent  Social Connections: Socially Integrated (08/28/2023)   Social Connection and Isolation Panel [NHANES]    Frequency of Communication with Friends and Family: More than three times a week    Frequency of Social Gatherings with Friends and Family: More than three times a week    Attends Religious Services: More than 4 times per year    Active Member of [employer] or Organizations: Yes    Attends Banker Meetings: Never    Marital Status: Married  Catering manager Violence: Not At Risk (08/28/2023)   Humiliation, Afraid, Rape, and Kick questionnaire    Fear of Current or Ex-Partner: No    Emotionally Abused: No    Physically Abused: No    Sexually Abused: No    Family History  Adopted: Yes  Problem Relation Age of Onset   Varicose Veins Daughter    Obesity Daughter    Hyperlipidemia Daughter    Diabetes Daughter    COPD Daughter    Depression Daughter    Asthma Daughter    Arthritis Son    Diabetes Son    Hypertension Son      Allergies  Allergen Reactions   Nsaids Other (See Comments)    Avoid due to kidney disease    Statins Other (See Comments)    Myalgias   Nexletol [Bempedoic Acid] Diarrhea    fatigue   Silver Other (See Comments)   Tegaderm High Gelling Alginate [Wound Dressings] Rash    Review of Systems  All other systems reviewed  and are negative.      Objective:   BP (!) 126/50   Pulse (!) 55   Ht 5' 5 (1.651 m)   Wt 196 lb 12.8 oz (89.3 kg)   SpO2 (!) 87% Comment: 2L  BMI 32.75 kg/m   Vitals:   09/18/23 1105  BP: (!) 126/50  Pulse: (!) 55  Height: 5' 5 (1.651 m)  Weight: 196 lb 12.8 oz (89.3 kg)  SpO2: (!) 87% Comment: 2L  BMI (Calculated): 32.75    Physical Exam Vitals and nursing note reviewed.  Constitutional:      Appearance: Normal appearance. He is obese.  Eyes:     Pupils: Pupils are equal, round, and reactive to light.  Cardiovascular:     Rate and Rhythm: Regular rhythm. Bradycardia present.     Pulses: Normal pulses.     Heart sounds: Normal heart sounds.  Pulmonary:     Effort: Pulmonary effort is normal.     Breath sounds: Normal breath sounds.  Musculoskeletal:        General: Normal range of motion.  Neurological:     General: No focal deficit present.     Mental Status: He is alert.  Psychiatric:        Mood and Affect: Mood normal.        Behavior: Behavior normal.      No results found for any visits on 09/18/23.  Recent Results (from the past 2160 hours)  Glucose, capillary     Status: None   Collection Time: 06/27/23 10:01 AM  Result Value Ref Range   Glucose-Capillary 98 70 - 99 mg/dL    Comment: Glucose reference range applies only to samples taken after fasting for at least 8 hours.  ABO/Rh     Status: None   Collection Time: 06/27/23 10:04 AM  Result Value Ref Range   ABO/RH(D)      A POS Performed at [facility], [address] [place], [place] [address]   Glucose, capillary     Status: Abnormal    Collection Time: 06/27/23 12:49 PM  Result Value Ref Range   Glucose-Capillary 113 (H) 70 - 99 mg/dL    Comment: Glucose reference range applies only to samples taken after fasting for at least 8 hours.  Potassium [facility] vascular lab only)     Status: Abnormal   Collection Time: 08/28/23 12:40 PM  Result Value Ref Range   Potassium [facility] vascular lab) 5.7 (H) 3.5 - 5.1 mmol/L    Comment: Performed at [facility], [address] [place], [place] [address]  Basic metabolic panel     Status: Abnormal   Collection Time: 08/28/23  5:38 PM  Result Value Ref Range   Sodium 141 135 - 145 mmol/L   Potassium 5.2 (H) 3.5 - 5.1 mmol/L   Chloride 108 98 - 111 mmol/L   CO2 22 22 - 32 mmol/L   Glucose, Bld 100 (H) 70 - 99 mg/dL    Comment: Glucose reference range applies only to samples taken after fasting for at least 8 hours.   BUN 68 (H) 8 - 23 mg/dL   Creatinine, Ser 5.21 (H) 0.61 - 1.24 mg/dL   Calcium 8.8 (L) 8.9 - 10.3 mg/dL   GFR, Estimated 12 (L) >60 mL/min    Comment: (NOTE) Calculated using the CKD-EPI Creatinine Equation (2021)    Anion gap 11 5 - 15    Comment: Performed at [facility], [address] [place],  [place] [address]  Basic metabolic panel with GFR     Status: Abnormal   Collection Time: 08/29/23  4:44 AM  Result Value Ref Range   Sodium 140 135 - 145 mmol/L   Potassium 4.8 3.5 - 5.1 mmol/L   Chloride 107 98 - 111 mmol/L   CO2 21 (L) 22 - 32 mmol/L   Glucose, Bld 92 70 - 99 mg/dL    Comment: Glucose reference range applies only to samples taken after fasting for at least 8 hours.   BUN 70 (H) 8 - 23 mg/dL   Creatinine, Ser 5.28 (H) 0.61 - 1.24 mg/dL   Calcium 8.7 (L) 8.9 - 10.3 mg/dL   GFR, Estimated 12 (L) >60 mL/min    Comment: (NOTE) Calculated using the CKD-EPI Creatinine Equation (2021)    Anion gap 12 5 - 15    Comment: Performed at [facility], [address] [place], [place] [address]  Hepatitis B surface antigen      Status: None   Collection Time: 08/29/23  7:12 AM  Result Value Ref Range   Hepatitis B Surface Ag NON REACTIVE NON REACTIVE    Comment: Performed at [facility], [address], [place], [place] [address]  Hepatitis B surface antibody,quantitative     Status: Abnormal   Collection Time: 08/29/23  7:12 AM  Result Value Ref Range   Hep B S AB Quant (Post) <3.5 (L) Immunity>10 mIU/mL    Comment: (NOTE)  Status of Immunity                     Anti-HBs Level  ------------------                     -------------- Inconsistent with Immunity                  0.0 - 10.0 Consistent with Immunity                         >10.0 Performed At: [name] [address] [place], [place] [id number] Jennette Shorter MD Ey:[id number]   CBC     Status: Abnormal   Collection Time: 08/29/23  7:12 AM  Result Value Ref Range   WBC 7.3 4.0 - 10.5 K/uL   RBC 3.42 (L) 4.22 - 5.81 MIL/uL   Hemoglobin 11.1 (L) 13.0 - 17.0 g/dL   HCT 65.9 (L) 60.9 - 47.9 %   MCV 99.4 80.0 - 100.0 fL   MCH 32.5 26.0 - 34.0 pg   MCHC 32.6 30.0 - 36.0 g/dL   RDW 86.4 88.4 - 84.4 %   Platelets 152 150 - 400 K/uL   nRBC 0.0 0.0 - 0.2 %    Comment: Performed at [facility], [address] [place], [place] [address]  CMP14+EGFR     Status: Abnormal   Collection Time: 09/10/23  2:53 PM  Result Value Ref Range   Glucose 97 70 - 99 mg/dL   BUN 20 8 - 27 mg/dL   Creatinine, Ser 7.04 (H) 0.76 - 1.27 mg/dL   eGFR 21 (L) >40 fO/fpw/8.26   BUN/Creatinine Ratio 7 (L) 10 - 24   Sodium 141 134 - 144 mmol/L   Potassium 3.8 3.5 - 5.2 mmol/L   Chloride 101 96 - 106 mmol/L   CO2 24 20 - 29 mmol/L   Calcium 8.6 8.6 - 10.2 mg/dL   Total Protein 6.6 6.0 -  8.5 g/dL   Albumin 3.8 3.8 - 4.8 g/dL   Globulin, Total 2.8 1.5 - 4.5 g/dL   Bilirubin Total 0.4 0.0 - 1.2 mg/dL   Alkaline Phosphatase 89 44 - 121 IU/L   AST 15 0 - 40 IU/L   ALT 5 0 - 44 IU/L  Magnesium      Status: None   Collection Time: 09/10/23  2:53  PM  Result Value Ref Range   Magnesium  1.7 1.6 - 2.3 mg/dL       Assessment & Plan Type 2 diabetes mellitus with chronic kidney disease on chronic dialysis, without long-term current use of insulin  (HCC) Continue current diabetes POC, as patient has been well controlled on current regimen.  Will adjust meds if needed based on labs.   Obesity (BMI 30-39.9) Continue current meds.  Will adjust as needed based on results.  The patient is asked to make an attempt to improve diet and exercise patterns to aid in medical management of this problem. Addressed importance of increasing and maintaining water  intake.   HFrEF (heart failure with reduced ejection fraction) (HCC) Patient is seen by cardiology, who manage this condition.  He is well controlled with current therapy.   Will defer to them for further changes to plan of care.  Essential hypertension Blood pressure well controlled with current medications.  Continue current therapy.  Will reassess at follow up.   Mixed hyperlipidemia Continue current therapy for lipid control. Will modify as needed based on labwork results.      Return in about 3 months (around 12/19/2023).   Total time spent: 20 minutes  ALAN CHRISTELLA ARRANT, FNP  09/18/2023   This document may have been prepared by [facility] Voice Recognition software and as such may include unintentional dictation errors.

## 2023-09-20 ENCOUNTER — Other Ambulatory Visit

## 2023-09-20 DIAGNOSIS — E039 Hypothyroidism, unspecified: Secondary | ICD-10-CM | POA: Diagnosis not present

## 2023-09-20 DIAGNOSIS — R946 Abnormal results of thyroid function studies: Secondary | ICD-10-CM

## 2023-09-20 DIAGNOSIS — E875 Hyperkalemia: Secondary | ICD-10-CM

## 2023-09-20 DIAGNOSIS — N184 Chronic kidney disease, stage 4 (severe): Secondary | ICD-10-CM | POA: Diagnosis not present

## 2023-09-20 DIAGNOSIS — E782 Mixed hyperlipidemia: Secondary | ICD-10-CM | POA: Diagnosis not present

## 2023-09-20 DIAGNOSIS — I10 Essential (primary) hypertension: Secondary | ICD-10-CM

## 2023-09-20 DIAGNOSIS — E1122 Type 2 diabetes mellitus with diabetic chronic kidney disease: Secondary | ICD-10-CM | POA: Diagnosis not present

## 2023-09-21 LAB — CMP14+EGFR
ALT: 7 IU/L (ref 0–44)
AST: 18 IU/L (ref 0–40)
Albumin: 3.9 g/dL (ref 3.8–4.8)
Alkaline Phosphatase: 98 IU/L (ref 44–121)
BUN/Creatinine Ratio: 7 — ABNORMAL LOW (ref 10–24)
BUN: 25 mg/dL (ref 8–27)
Bilirubin Total: 0.4 mg/dL (ref 0.0–1.2)
CO2: 24 mmol/L (ref 20–29)
Calcium: 8.6 mg/dL (ref 8.6–10.2)
Chloride: 100 mmol/L (ref 96–106)
Creatinine, Ser: 3.44 mg/dL — ABNORMAL HIGH (ref 0.76–1.27)
Globulin, Total: 3 g/dL (ref 1.5–4.5)
Glucose: 91 mg/dL (ref 70–99)
Potassium: 4.3 mmol/L (ref 3.5–5.2)
Sodium: 141 mmol/L (ref 134–144)
Total Protein: 6.9 g/dL (ref 6.0–8.5)
eGFR: 17 mL/min/{1.73_m2} — ABNORMAL LOW (ref >59)

## 2023-09-21 LAB — CBC WITH DIFFERENTIAL/PLATELET
Basophils Absolute: 0 10*3/uL (ref 0.0–0.2)
Basos: 0 %
EOS (ABSOLUTE): 0.1 10*3/uL (ref 0.0–0.4)
Eos: 1 %
Hematocrit: 32.9 % — ABNORMAL LOW (ref 37.5–51.0)
Hemoglobin: 10.7 g/dL — ABNORMAL LOW (ref 13.0–17.7)
Immature Grans (Abs): 0.1 10*3/uL (ref 0.0–0.1)
Immature Granulocytes: 1 %
Lymphocytes Absolute: 2.2 10*3/uL (ref 0.7–3.1)
Lymphs: 28 %
MCH: 32.3 pg (ref 26.6–33.0)
MCHC: 32.5 g/dL (ref 31.5–35.7)
MCV: 99 fL — ABNORMAL HIGH (ref 79–97)
Monocytes Absolute: 0.8 10*3/uL (ref 0.1–0.9)
Monocytes: 11 %
Neutrophils Absolute: 4.6 10*3/uL (ref 1.4–7.0)
Neutrophils: 59 %
Platelets: 139 10*3/uL — ABNORMAL LOW (ref 150–450)
RBC: 3.31 x10E6/uL — ABNORMAL LOW (ref 4.14–5.80)
RDW: 12.9 % (ref 11.6–15.4)
WBC: 7.8 10*3/uL (ref 3.4–10.8)

## 2023-09-21 LAB — HEMOGLOBIN A1C
Est. average glucose Bld gHb Est-mCnc: 97 mg/dL
Hgb A1c MFr Bld: 5 % (ref 4.8–5.6)

## 2023-09-21 LAB — URIC ACID: Uric Acid: 2.1 mg/dL — ABNORMAL LOW (ref 3.8–8.4)

## 2023-09-21 LAB — LIPID PANEL
Chol/HDL Ratio: 2 ratio (ref 0.0–5.0)
Cholesterol, Total: 80 mg/dL — ABNORMAL LOW (ref 100–199)
HDL: 40 mg/dL (ref >39)
LDL Chol Calc (NIH): 20 mg/dL (ref 0–99)
Triglycerides: 103 mg/dL (ref 0–149)
VLDL Cholesterol Cal: 20 mg/dL (ref 5–40)

## 2023-09-21 LAB — TSH: TSH: 4.05 u[IU]/mL (ref 0.450–4.500)

## 2023-09-22 DIAGNOSIS — Z992 Dependence on renal dialysis: Secondary | ICD-10-CM | POA: Diagnosis not present

## 2023-09-22 DIAGNOSIS — N186 End stage renal disease: Secondary | ICD-10-CM | POA: Diagnosis not present

## 2023-09-24 DIAGNOSIS — Z1331 Encounter for screening for depression: Secondary | ICD-10-CM | POA: Diagnosis not present

## 2023-09-24 DIAGNOSIS — M3501 Sicca syndrome with keratoconjunctivitis: Secondary | ICD-10-CM | POA: Diagnosis not present

## 2023-09-24 DIAGNOSIS — Z9981 Dependence on supplemental oxygen: Secondary | ICD-10-CM | POA: Diagnosis not present

## 2023-09-24 DIAGNOSIS — J811 Chronic pulmonary edema: Secondary | ICD-10-CM | POA: Diagnosis not present

## 2023-09-24 DIAGNOSIS — G4733 Obstructive sleep apnea (adult) (pediatric): Secondary | ICD-10-CM | POA: Diagnosis not present

## 2023-09-24 DIAGNOSIS — N184 Chronic kidney disease, stage 4 (severe): Secondary | ICD-10-CM | POA: Diagnosis not present

## 2023-09-24 DIAGNOSIS — E119 Type 2 diabetes mellitus without complications: Secondary | ICD-10-CM | POA: Diagnosis not present

## 2023-09-24 DIAGNOSIS — J439 Emphysema, unspecified: Secondary | ICD-10-CM | POA: Diagnosis not present

## 2023-09-24 DIAGNOSIS — N186 End stage renal disease: Secondary | ICD-10-CM | POA: Diagnosis not present

## 2023-09-24 DIAGNOSIS — H353221 Exudative age-related macular degeneration, left eye, with active choroidal neovascularization: Secondary | ICD-10-CM | POA: Diagnosis not present

## 2023-09-24 DIAGNOSIS — D509 Iron deficiency anemia, unspecified: Secondary | ICD-10-CM | POA: Diagnosis not present

## 2023-09-24 DIAGNOSIS — R0602 Shortness of breath: Secondary | ICD-10-CM | POA: Diagnosis not present

## 2023-09-24 DIAGNOSIS — D631 Anemia in chronic kidney disease: Secondary | ICD-10-CM | POA: Diagnosis not present

## 2023-09-24 DIAGNOSIS — Z992 Dependence on renal dialysis: Secondary | ICD-10-CM | POA: Diagnosis not present

## 2023-09-25 ENCOUNTER — Encounter: Payer: Self-pay | Admitting: Internal Medicine

## 2023-10-01 DIAGNOSIS — G4733 Obstructive sleep apnea (adult) (pediatric): Secondary | ICD-10-CM | POA: Diagnosis not present

## 2023-10-02 ENCOUNTER — Other Ambulatory Visit: Payer: Self-pay

## 2023-10-02 ENCOUNTER — Ambulatory Visit

## 2023-10-02 DIAGNOSIS — N186 End stage renal disease: Secondary | ICD-10-CM

## 2023-10-02 NOTE — Progress Notes (Signed)
 10/02/2023 Name: [name] [name] MRN: [id number] DOB: Sep 09, 1945  Chief Complaint  Patient presents with   Medication Management   Chronic Kidney Disease    [name] [name] is a 78 y.o. year old male who presented for a telephone visit.   They were referred to the pharmacist by their PCP for assistance in managing complex medication management.    Subjective:  Care Team: Primary Care Provider: Trenda Frisk, FNP ; Next Scheduled Visit: 01/22/24  Medication Access/Adherence  Current Pharmacy:  [id number] [place], [place] - [address] AT [facility] [address] [place] [address] [place] [address] Phone: [phone] Fax: [phone]  [facility] - [place], [place] - [address] [place] [address] Phone: [phone] Fax: [phone]  [id number] - [place], [place] - [address] [place] [address] Phone: [phone] Fax: [phone]   Patient reports affordability concerns with their medications: No  Patient reports access/transportation concerns to their pharmacy: No  Patient reports adherence concerns with their medications:  No     CKD-Dialysis -Patient new to dialysis in the last month, wanting medication review for any necessary changes to dosing/medications -Per report, not taking calcitriol , state it was never renewed by [facility] so thought it was stopped. Review of [facility] note shows that it appears they thought patient was still taking it -Patient reports no follow up with them scheduled now that he is on dialysis MWF at 6am -Also reports torsemide  increased to 20mg  2 tablets daily -Reports taking gabapentin  100mg  2 capsules in morning, gabapentin  400mg  1 capsule at bedtime for total of 600mg  daily   Objective:  Lab Results  Component Value Date   HGBA1C 5.0 09/20/2023    Lab Results  Component Value Date   CREATININE 3.44  (H) 09/20/2023   BUN 25 09/20/2023   NA 141 09/20/2023   K 4.3 09/20/2023   CL 100 09/20/2023   CO2 24 09/20/2023    Lab Results  Component Value Date   CHOL 80 (L) 09/20/2023   HDL 40 09/20/2023   LDLCALC 20 09/20/2023   TRIG 103 09/20/2023   CHOLHDL 2.0 09/20/2023    Medications Reviewed Today     Reviewed by Carnell Christian, RPH (Pharmacist) on 10/02/23 at 1645  Med List Status: <None>   Medication Order Taking? Sig Documenting Provider Last Dose Status Informant  acetaminophen  (TYLENOL ) 650 MG CR tablet [id number] Yes Take 1,300 mg by mouth every 8 (eight) hours as needed for pain. [provider] Taking Active Spouse/Significant Other  albuterol  (VENTOLIN  HFA) 108 (90 Base) MCG/ACT inhaler [id number] Yes Inhale 2 puffs into the lungs every 6 (six) hours as needed for wheezing or shortness of breath. [provider] Taking Active Spouse/Significant Other  Alcohol Swabs (DROPSAFE ALCOHOL PREP) 70 % PADS [id number]  Apply 1 each topically as needed (With repatha and blood sugar checks.). [provider]  Active Spouse/Significant Other  Ascorbic Acid  (VITAMIN C ) 1000 MG tablet [id number] Yes Take 1,000 mg by mouth at bedtime. [provider] Taking Active Spouse/Significant Other  aspirin  EC 81 MG tablet [id number] Yes Take 81 mg by mouth daily. Swallow whole. [provider] Taking Active   busPIRone  (BUSPAR ) 5 MG tablet [id number] Yes TAKE 1 TABLET BY MOUTH TWICE DAILY AS NEEDED  Patient taking differently: Take 5 mg by mouth at bedtime.   Trenda Frisk,  FNP Taking Active Spouse/Significant Other  calcitRIOL  (ROCALTROL ) 0.25 MCG capsule [id number] No Take 0.25 mcg by mouth daily.  Patient not taking: Reported on 10/02/2023   [provider] Not Taking Active Spouse/Significant Other  citalopram  (CELEXA ) 40 MG tablet [id number] Yes TAKE 1 TABLET EVERY DAY  Patient taking differently: Take 40 mg by mouth every morning.   Trenda Frisk, FNP Taking Active Spouse/Significant Other  Patient not taking:  Discontinued 10/02/23 1645 (Completed Course) Evolocumab 140 MG/ML SOAJ [id number] Yes Inject 140 mg into the skin every 14 (fourteen) days. [provider] Taking Active   ezetimibe  (ZETIA ) 10 MG tablet [id number] Yes Take 1 tablet (10 mg total) by mouth at bedtime. Trenda Frisk, FNP Taking Active   febuxostat  (ULORIC ) 40 MG tablet [id number] Yes Take 40 mg by mouth at bedtime. [provider] Taking Active Spouse/Significant Other  fexofenadine (ALLEGRA) 180 MG tablet [id number] Yes Take 180 mg by mouth daily as needed for allergies or rhinitis. [provider] Taking Active Spouse/Significant Other  Fluticasone -Umeclidin-Vilant (TRELEGY ELLIPTA ) 100-62.5-25 MCG/ACT AEPB [id number] Yes Inhale 1 Inhalation into the lungs daily at 6 (six) AM.  Patient taking differently: Inhale 1 Inhalation into the lungs daily as needed (shortness of breath).   Trenda Frisk, FNP Taking Active Spouse/Significant Other  gabapentin  (NEURONTIN ) 100 MG capsule [id number]  TAKE 2 CAPSULES TWICE DAILY Trenda Frisk, FNP  Active Spouse/Significant Other  gabapentin  (NEURONTIN ) 400 MG capsule [id number]  TAKE 1 CAPSULE AT BEDTIME Trenda Frisk, FNP  Active Spouse/Significant Other  metoprolol  succinate (TOPROL -XL) 50 MG 24 hr tablet [id number] Yes TAKE 1 TABLET EVERY DAY  Patient taking differently: Take 50 mg by mouth every morning.   Trenda Frisk, FNP Taking Active Spouse/Significant Other  Multiple Vitamins-Minerals (PRESERVISION AREDS) CAPS [id number] Yes Take 1 capsule by mouth 2 (two) times daily. [provider] Taking Active Spouse/Significant Other  OXYGEN  [id number] Yes Inhale 2-3 L into the lungs See admin instructions. Used as needed throughout the day and continuous at bedtime [provider] Taking Active Spouse/Significant Other  pantoprazole  (PROTONIX ) 40 MG tablet [id number] Yes  Take 1 tablet (40 mg total) by mouth every morning. Trenda Frisk, FNP Taking Active   SUPER B COMPLEX/C PO [id number]  Take 1 tablet by mouth daily. [provider]  Active Spouse/Significant Other  torsemide  (DEMADEX ) 20 MG tablet [id number]  Take 20 mg by mouth daily. [provider]  Active Spouse/Significant Other           Med Note Zilphia Hilt, Daria Eddy   Tue Oct 02, 2023  3:51 PM) 2 tablets daily              Assessment/Plan:   CKD/Dialysis -Recommend patient make sure dialysis team is aware he has not been taking calcitriol  for quite sometime and see if they want him to restart. Will discuss when he goes in tomorrow morning -Updated medication list to reflect torsemide  dose increase -Counseled that buspar  elimination may be slowed and to monitor for increased side effects such as drowsiness. -Counseled that gabapentin  is above max recommending dose now that he is on dialysis and we need to try to slowly titrate down. DECREASE to Gabapentin  100mg  2 capsules BID starting tonight  Follow Up Plan: 1 week  Carnell Christian, PharmD Clinical Pharmacist [phone]

## 2023-10-03 DIAGNOSIS — B351 Tinea unguium: Secondary | ICD-10-CM | POA: Diagnosis not present

## 2023-10-03 DIAGNOSIS — E1142 Type 2 diabetes mellitus with diabetic polyneuropathy: Secondary | ICD-10-CM | POA: Diagnosis not present

## 2023-10-03 DIAGNOSIS — L84 Corns and callosities: Secondary | ICD-10-CM | POA: Diagnosis not present

## 2023-10-04 ENCOUNTER — Ambulatory Visit

## 2023-10-04 DIAGNOSIS — I5020 Unspecified systolic (congestive) heart failure: Secondary | ICD-10-CM

## 2023-10-04 DIAGNOSIS — H353211 Exudative age-related macular degeneration, right eye, with active choroidal neovascularization: Secondary | ICD-10-CM | POA: Diagnosis not present

## 2023-10-04 DIAGNOSIS — I351 Nonrheumatic aortic (valve) insufficiency: Secondary | ICD-10-CM | POA: Diagnosis not present

## 2023-10-04 DIAGNOSIS — I340 Nonrheumatic mitral (valve) insufficiency: Secondary | ICD-10-CM | POA: Diagnosis not present

## 2023-10-04 DIAGNOSIS — I10 Essential (primary) hypertension: Secondary | ICD-10-CM

## 2023-10-04 DIAGNOSIS — Z952 Presence of prosthetic heart valve: Secondary | ICD-10-CM

## 2023-10-04 DIAGNOSIS — N186 End stage renal disease: Secondary | ICD-10-CM

## 2023-10-04 DIAGNOSIS — J449 Chronic obstructive pulmonary disease, unspecified: Secondary | ICD-10-CM | POA: Diagnosis not present

## 2023-10-04 DIAGNOSIS — R0602 Shortness of breath: Secondary | ICD-10-CM

## 2023-10-04 DIAGNOSIS — Z9981 Dependence on supplemental oxygen: Secondary | ICD-10-CM

## 2023-10-04 DIAGNOSIS — I361 Nonrheumatic tricuspid (valve) insufficiency: Secondary | ICD-10-CM

## 2023-10-04 DIAGNOSIS — I25118 Atherosclerotic heart disease of native coronary artery with other forms of angina pectoris: Secondary | ICD-10-CM

## 2023-10-09 ENCOUNTER — Other Ambulatory Visit: Payer: Self-pay

## 2023-10-09 ENCOUNTER — Ambulatory Visit (INDEPENDENT_AMBULATORY_CARE_PROVIDER_SITE_OTHER): Admitting: Cardiovascular Disease

## 2023-10-09 ENCOUNTER — Other Ambulatory Visit: Payer: Self-pay | Admitting: *Deleted

## 2023-10-09 ENCOUNTER — Encounter: Payer: Self-pay | Admitting: Cardiovascular Disease

## 2023-10-09 VITALS — BP 119/58 | HR 68 | Ht 65.0 in | Wt 195.6 lb

## 2023-10-09 DIAGNOSIS — R0602 Shortness of breath: Secondary | ICD-10-CM

## 2023-10-09 DIAGNOSIS — Z952 Presence of prosthetic heart valve: Secondary | ICD-10-CM

## 2023-10-09 DIAGNOSIS — I10 Essential (primary) hypertension: Secondary | ICD-10-CM | POA: Diagnosis not present

## 2023-10-09 DIAGNOSIS — I5020 Unspecified systolic (congestive) heart failure: Secondary | ICD-10-CM

## 2023-10-09 DIAGNOSIS — I25118 Atherosclerotic heart disease of native coronary artery with other forms of angina pectoris: Secondary | ICD-10-CM

## 2023-10-09 DIAGNOSIS — N186 End stage renal disease: Secondary | ICD-10-CM | POA: Diagnosis not present

## 2023-10-09 NOTE — Progress Notes (Signed)
   10/09/2023 Name: [name] [name] MRN: [id number] DOB: 30-Jan-1946  Chief Complaint  Patient presents with   Medication Management    [name] [name] is a 78 y.o. year old male who presented for a telephone visit.   They were referred to the pharmacist by their PCP for assistance in managing complex medication management.    Subjective:  Care Team: Primary Care Provider: Trenda Frisk, FNP ; Next Scheduled Visit: 01/22/24  Medication Access/Adherence  Current Pharmacy:  [id number] [name], [place] - [address] AT [facility] [address] [place] [address] [place] [address] Phone: [phone] Fax: [phone]  [facility] - [place], [place] - [address] [name] [address] Phone: [phone] Fax: [phone]  [id number] - [place], [place] - [address] [place] [address] Phone: [phone] Fax: [phone]   Patient reports affordability concerns with their medications: No  Patient reports access/transportation concerns to their pharmacy: No  Patient reports adherence concerns with their medications:  No     CKD-Dialysis -Patient new to dialysis in the last month, wanting medication review for any necessary changes to dosing/medications -Reports that he responded well to gabapentin  dose reduction to 100mg  2 capsules BID with no issues   Objective:  Lab Results  Component Value Date   HGBA1C 5.0 09/20/2023    Lab Results  Component Value Date   CREATININE 3.44 (H) 09/20/2023   BUN 25 09/20/2023   NA 141 09/20/2023   K 4.3 09/20/2023   CL 100 09/20/2023   CO2 24 09/20/2023    Lab Results  Component Value Date   CHOL 80 (L) 09/20/2023   HDL 40 09/20/2023   LDLCALC 20 09/20/2023   TRIG 103 09/20/2023   CHOLHDL 2.0 09/20/2023    Medications Reviewed Today   Medications were not reviewed in this encounter       Assessment/Plan:    CKD/Dialysis -Continue calcitriol  as prescribed by nephrology -Counseled that gabapentin  is above max recommending dose now that he is on dialysis and we need to try to slowly titrate down. DECREASE to Gabapentin  100mg  2 capsules qam, 1 capsule at bedtime.  Follow Up Plan: 2 weeks  [name] [name], PharmD Clinical Pharmacist [phone]

## 2023-10-09 NOTE — Progress Notes (Signed)
 Cardiology Office Note   Date:  10/09/2023   ID:  [name], [name] 26-Oct-1945, MRN [id number]  PCP:  Trenda Frisk, FNP  Cardiologist:  Debborah Fairly, MD      History of Present Illness: [name] [name] is a 78 y.o. male who presents for  Chief Complaint  Patient presents with   Follow-up    Echo results follow up    Only has DOE, no SOB at rest.      Past Medical History:  Diagnosis Date   Accidental cut, puncture, perforation, or hemorrhage during heart catheterization 12/27/2012   Acute kidney injury superimposed on stage 4 chronic kidney disease (HCC) 07/27/2020   Acute on chronic combined systolic and diastolic CHF (congestive heart failure) (HCC) 04/25/2023   Acute on chronic respiratory failure with hypoxia (HCC) 04/22/2023   Adopted    AKI (acute kidney injury) (HCC) 02/05/2023   ANA positive 07/12/2015   Aortic atherosclerosis (HCC)    Aortic stenosis, severe    a.) s/p TAVR on 08/01/2016: 29 mm CoreValve Evolut placed   Arthritis    Bacteremia due to Streptococcus 04/24/2023   Basal cell carcinoma 01/04/2010   Right sup. post. helix. Excised 03/02/2010   BCC (basal cell carcinoma of skin) 11/24/2020   R neck below the ear, EDC   BPH (benign prostatic hyperplasia)    CAD (coronary artery disease) 12/26/2012   a.) LHC 12/26/2012: 50% mLAD, 80% mLCx, 100% pRCA --> [facility]TS at [facility] --> decided again revascularization due to insig LAD disease and the fact that RCA lesion was chronic   CAP (community acquired pneumonia) 07/26/2020   Chronic respiratory failure with hypoxia (HCC)    Claudication of both lower extremities (HCC)    Complete tear of right rotator cuff 11/15/2015   Complex tear of lateral meniscus of left knee as current injury 12/06/2020   Complex tear of medial meniscus of left knee as current injury 10/18/2020   COPD (chronic obstructive pulmonary disease) (HCC)    Diarrhea 02/04/2023   Diet-controlled type 2 diabetes  mellitus (HCC)    Diverticulosis    Dyspnea    Electrolyte abnormality 02/04/2023   ESRD (end stage renal disease) (HCC)    GERD (gastroesophageal reflux disease)    Gout    Heart murmur    HFrEF (heart failure with reduced ejection fraction) (HCC)    a.) s/p TAVR on 08/01/2016: 29 mm CoreValve Evolut placed   History of bilateral cataract extraction    History of hiatal hernia    HLD (hyperlipidemia)    Hyperkalemia 07/30/2020   Hypertension    Hypomagnesemia 02/05/2023   Hypothyroidism    IDA (iron deficiency anemia)    Migraines    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   a.) TTE 04/24/2023: moderate (MPG 7.5 mmHG)   OSA on CPAP    Pneumonia 07/2020   Pulmonary hypertension (HCC)    a.) TTE 08/17/2022: mild pHTN per echo report; b.) TTE 04/24/2023: RVSP 39.1   Recurrent nephrolithiasis 12/27/2012   Rotator cuff tendinitis, right 11/15/2015   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 MOHS.   Squamous  cell carcinoma of skin 03/07/2021   Right zygoma, EDC 05/02/21   Statin intolerance (myalgias)    Supplemental oxygen  dependent (2-3 L/[place])      Past Surgical History:  Procedure Laterality Date   AORTIC VALVE REPLACEMENT N/A 08/01/2016   29 mm CoreValve Evolut; Location: [facility]; Surgeon: Forbes Ida, MD   AV FISTULA PLACEMENT Right 06/27/2023   Procedure: ARTERIOVENOUS (AV) FISTULA CREATION (BRACHIALCEPHALIC);  Surgeon: Jackquelyn Mass, MD;  Location: [facility];  Service: Vascular;  Laterality: Right;   CARDIAC CATHETERIZATION N/A 12/26/2012   2v CAD; retained pigtail in myocardium --> transferred to [facility]; Location:  [facility]; Surgeon: Rinda Cheers, MD   CATARACT EXTRACTION W/PHACO Left 10/03/2022   Procedure: CATARACT EXTRACTION PHACO AND INTRAOCULAR LENS PLACEMENT (IOC) LEFT DIABETIC 8.22 00:55.5;  Surgeon: Clair Crews, MD;  Location: [facility];  Service: Ophthalmology;  Laterality: Left;  sleep apnea   CATARACT EXTRACTION W/PHACO Right 10/17/2022   Procedure: CATARACT EXTRACTION PHACO AND INTRAOCULAR LENS PLACEMENT (IOC) RIGHT DIABETIC;  Surgeon: Clair Crews, MD;  Location: [facility];  Service: Ophthalmology;  Laterality: Right;  4.79 0:37.4   COLONOSCOPY     COLONOSCOPY N/A 04/28/2023   Procedure: COLONOSCOPY;  Surgeon: Toledo, Alphonsus Jeans, MD;  Location: [facility];  Service: Gastroenterology;  Laterality: N/A;   DIALYSIS/PERMA CATHETER INSERTION N/A 08/29/2023   Procedure: DIALYSIS/PERMA CATHETER INSERTION;  Surgeon: Jackquelyn Mass, MD;  Location: [facility];  Service: Cardiovascular;  Laterality: N/A;   KNEE ARTHROSCOPY WITH MEDIAL MENISECTOMY Left 12/02/2020   Procedure: KNEE ARTHROSCOPY WITH DEBRIDEMENT AND PARTIAL MEDIAL AND LATERAL MENISECTOMY;  Surgeon: Elner Hahn, MD;  Location: [facility];  Service: Orthopedics;  Laterality: Left;   LITHOTRIPSY     PERCUTANEOUS REMOVAL INTRA-AORTIC BALLOON CATH N/A 12/26/2012   Procedure: PERCUTANEOUS REMOVAL INTRA-AORTIC BALLOON CATH; Surgeon: Erik Havens, MD; Location: [facility]; Service: Cardiothoracic   POLYPECTOMY  04/28/2023   Procedure: POLYPECTOMY;  Surgeon: Corky Diener, Alphonsus Jeans, MD;  Location: [facility];  Service: Gastroenterology;;   RIGHT HEART CATH Right 07/13/2016   Location: [facility]; Surgeon: Toula French, MD   TEE WITHOUT CARDIOVERSION Right 04/26/2023   Procedure: TRANSESOPHAGEAL ECHOCARDIOGRAM (TEE);  Surgeon: Cherrie Cornwall, MD;  Location: [facility];  Service: Cardiovascular;  Laterality: Right;   TRANSESOPHAGEAL ECHOCARDIOGRAM N/A 12/26/2012   Procedure: TRANSESOPHAGEAL ECHOCARDIOGRAPHY;  Surgeon: Erik Havens, MD; Location: [facility]; Service: Cardiothoracic   UMBILICAL HERNIA REPAIR N/A 02/13/2014   Procedure: LAPAROSCOPIC UMBILICAL HERNIA REPAIR; Surgeon: Oma Bias, MD; Location: [facility]; Service: General Surgery     Current Outpatient Medications  Medication Sig Dispense Refill   acetaminophen  (TYLENOL ) 650 MG CR tablet Take 1,300 mg by mouth every 8 (eight) hours as needed for pain.     albuterol  (VENTOLIN  HFA) 108 (90 Base) MCG/ACT inhaler Inhale 2 puffs into the lungs every 6 (six) hours as needed for wheezing or shortness of breath.     Alcohol Swabs (DROPSAFE ALCOHOL PREP) 70 % PADS Apply 1 each topically as needed (With repatha and blood sugar checks.).     Ascorbic Acid  (VITAMIN C ) 1000 MG tablet Take 1,000 mg by mouth at bedtime.     aspirin  EC 81 MG tablet Take 81 mg by mouth daily. Swallow whole.     busPIRone  (BUSPAR ) 5 MG tablet TAKE 1 TABLET BY MOUTH TWICE DAILY AS NEEDED (Patient taking differently: Take 5 mg by mouth at bedtime.) 60 tablet 3   calcitRIOL  (ROCALTROL ) 0.25 MCG capsule Take 0.25  mcg by mouth daily.     citalopram  (CELEXA ) 40 MG tablet TAKE 1 TABLET EVERY DAY (Patient taking differently: Take 40 mg by mouth every morning.) 90 tablet 3   Evolocumab 140 MG/ML SOAJ Inject 140 mg into the skin every 14 (fourteen) days.     ezetimibe  (ZETIA ) 10 MG tablet Take 1 tablet (10 mg total) by mouth at bedtime. 90 tablet 1   febuxostat  (ULORIC ) 40 MG tablet Take 40 mg by mouth at bedtime.     fexofenadine (ALLEGRA) 180 MG tablet Take 180 mg by mouth daily as needed for allergies or rhinitis.     Fluticasone -Umeclidin-Vilant (TRELEGY ELLIPTA ) 100-62.5-25 MCG/ACT AEPB Inhale 1 Inhalation into the lungs daily at 6 (six) AM. (Patient taking differently: Inhale 1 Inhalation into the lungs daily as needed (shortness of breath).) 60 each 11   gabapentin  (NEURONTIN ) 100 MG capsule TAKE 2 CAPSULES TWICE DAILY 360 capsule 3   gabapentin   (NEURONTIN ) 400 MG capsule TAKE 1 CAPSULE AT BEDTIME 90 capsule 3   metoprolol  succinate (TOPROL -XL) 50 MG 24 hr tablet TAKE 1 TABLET EVERY DAY 90 tablet 3   Multiple Vitamins-Minerals (PRESERVISION AREDS) CAPS Take 1 capsule by mouth 2 (two) times daily.     OXYGEN  Inhale 2-3 L into the lungs See admin instructions. Used as needed throughout the day and continuous at bedtime     pantoprazole  (PROTONIX ) 40 MG tablet Take 1 tablet (40 mg total) by mouth every morning. 90 tablet 3   SUPER B COMPLEX/C PO Take 1 tablet by mouth daily.     torsemide  (DEMADEX ) 20 MG tablet Take 20 mg by mouth daily.     No current facility-administered medications for this visit.    Allergies:   Nsaids, Statins, Nexletol [bempedoic acid], Pedi-pre tape spray [wound dressing adhesive], Silver, and Tegaderm high gelling alginate [wound dressings]    Social History:   reports that he quit smoking about 15 years ago. His smoking use included cigarettes. He started smoking about 69 years ago. He has a 108 pack-year smoking history. He quit smokeless tobacco use about 19 years ago.  His smokeless tobacco use included chew. He reports current alcohol use. He reports that he does not use drugs.   Family History:  family history includes Arthritis in his son; Asthma in his daughter; COPD in his daughter; Depression in his daughter; Diabetes in his daughter and son; Hyperlipidemia in his daughter; Hypertension in his son; Obesity in his daughter; Varicose Veins in his daughter. He was adopted.    ROS:     Review of Systems  Constitutional: Negative.   HENT: Negative.    Eyes: Negative.   Respiratory: Negative.    Gastrointestinal: Negative.   Genitourinary: Negative.   Musculoskeletal: Negative.   Skin: Negative.   Neurological: Negative.   Endo/Heme/Allergies: Negative.   Psychiatric/Behavioral: Negative.    All other systems reviewed and are negative.     All other systems are reviewed and negative.     PHYSICAL EXAM: VS:  BP (!) 119/58   Pulse 68   Ht 5' 5 (1.651 m)   Wt 195 lb 9.6 oz (88.7 kg)   SpO2 90%   BMI 32.55 kg/m  , BMI Body mass index is 32.55 kg/m. Last weight:  Wt Readings from Last 3 Encounters:  10/09/23 195 lb 9.6 oz (88.7 kg)  09/18/23 196 lb 12.8 oz (89.3 kg)  09/11/23 192 lb (87.1 kg)     Physical Exam Vitals reviewed.  Constitutional:  Appearance: Normal appearance. He is normal weight.  HENT:     Head: Normocephalic.     Nose: Nose normal.     Mouth/Throat:     Mouth: Mucous membranes are moist.   Eyes:     Pupils: Pupils are equal, round, and reactive to light.    Cardiovascular:     Rate and Rhythm: Normal rate and regular rhythm.     Pulses: Normal pulses.     Heart sounds: Normal heart sounds.  Pulmonary:     Effort: Pulmonary effort is normal.  Abdominal:     General: Abdomen is flat. Bowel sounds are normal.   Musculoskeletal:        General: Normal range of motion.     Cervical back: Normal range of motion.   Skin:    General: Skin is warm.   Neurological:     General: No focal deficit present.     Mental Status: He is alert.   Psychiatric:        Mood and Affect: Mood normal.       EKG:   Recent Labs: 04/21/2023: B Natriuretic Peptide 820.9 09/10/2023: Magnesium  1.7 09/20/2023: ALT 7; BUN 25; Creatinine, Ser 3.44; Hemoglobin 10.7; Platelets 139; Potassium 4.3; Sodium 141; TSH 4.050    Lipid Panel    Component Value Date/Time   CHOL 80 (L) 09/20/2023 1003   CHOL 100 07/26/2012 0111   TRIG 103 09/20/2023 1003   TRIG 157 07/26/2012 0111   HDL 40 09/20/2023 1003   HDL 35 (L) 07/26/2012 0111   CHOLHDL 2.0 09/20/2023 1003   VLDL 31 07/26/2012 0111   LDLCALC 20 09/20/2023 1003   LDLCALC 34 07/26/2012 0111      Other studies Reviewed: Additional studies/ records that were reviewed today include:  Review of the above records demonstrates:       No data to display            ASSESSMENT AND  PLAN:    ICD-10-CM   1. Essential hypertension  I10     2. ESRD (end stage renal disease) (HCC)  N18.6     3. HFrEF (heart failure with reduced ejection fraction) (HCC)  I50.20     4. SOB (shortness of breath)  R06.02    DOE, with echo showing  bordeline LV function 53%, but on GDMT stable.    5. S/P TAVR (transcatheter aortic valve replacement)  Z95.2    Mild to moderate AR for from TAVR    6. Coronary artery disease involving native coronary artery of native heart with other form of angina pectoris (HCC)  I25.118        Problem List Items Addressed This Visit       Cardiovascular and Mediastinum   Essential hypertension - Primary   Coronary artery disease   HFrEF (heart failure with reduced ejection fraction) (HCC)     Genitourinary   ESRD (end stage renal disease) (HCC)   Other Visit Diagnoses       SOB (shortness of breath)       DOE, with echo showing  bordeline LV function 53%, but on GDMT stable.     S/P TAVR (transcatheter aortic valve replacement)       Mild to moderate AR for from TAVR          Disposition:   Return in about 2 months (around 12/09/2023).    Total time spent: 30 minutes  Signed,  Debborah Fairly, MD  10/09/2023 11:17 AM  [facility]

## 2023-10-09 NOTE — Patient Outreach (Signed)
 Complex Care Management   Visit Note  10/09/2023  Name:  [name] [name] MRN: [id number] DOB: [birth year]  Situation: Referral received for Complex Care Management related to Heart Failure, ESRD, and Chronic Kidney Disease I obtained verbal consent from Caregiver Patient.  Visit completed with Mr & Mrs [name] [name]  on the phone Patient reports his Hemodialysis (HD) sessions are getting better. He is able to tolerate an entire session this week but continues to have cramps at the end of the HD sessions. Voiced understand about causes of cramps, monitoring the potassium on the labs provided by Dialysis, high potassium foods to take on dialysis days. Voiced understanding that his potassium should generally stay between 3.5 to 5.2   Background:   Past Medical History:  Diagnosis Date   Accidental cut, puncture, perforation, or hemorrhage during heart catheterization 12/27/2012   Acute kidney injury superimposed on stage 4 chronic kidney disease (HCC) 07/27/2020   Acute on chronic combined systolic and diastolic CHF (congestive heart failure) (HCC) 04/25/2023   Acute on chronic respiratory failure with hypoxia (HCC) 04/22/2023   Adopted    AKI (acute kidney injury) (HCC) 02/05/2023   ANA positive 07/12/2015   Aortic atherosclerosis (HCC)    Aortic stenosis, severe    a.) s/p TAVR on 08/01/2016: 29 mm CoreValve Evolut placed   Arthritis    Bacteremia due to Streptococcus 04/24/2023   Basal cell carcinoma 01/04/2010   Right sup. post. helix. Excised 03/02/2010   BCC (basal cell carcinoma of skin) 11/24/2020   R neck below the ear, EDC   BPH (benign prostatic hyperplasia)    CAD (coronary artery disease) 12/26/2012   a.) LHC 12/26/2012: 50% mLAD, 80% mLCx, 100% pRCA --> [facility] at [facility] --> decided again revascularization due to insig LAD disease and the fact that RCA lesion was chronic   CAP (community acquired pneumonia) 07/26/2020   Chronic respiratory failure with hypoxia  (HCC)    Claudication of both lower extremities (HCC)    Complete tear of right rotator cuff 11/15/2015   Complex tear of lateral meniscus of left knee as current injury 12/06/2020   Complex tear of medial meniscus of left knee as current injury 10/18/2020   COPD (chronic obstructive pulmonary disease) (HCC)    Diarrhea 02/04/2023   Diet-controlled type 2 diabetes mellitus (HCC)    Diverticulosis    Dyspnea    Electrolyte abnormality 02/04/2023   ESRD (end stage renal disease) (HCC)    GERD (gastroesophageal reflux disease)    Gout    Heart murmur    HFrEF (heart failure with reduced ejection fraction) (HCC)    a.) s/p TAVR on 08/01/2016: 29 mm CoreValve Evolut placed   History of bilateral cataract extraction    History of hiatal hernia    HLD (hyperlipidemia)    Hyperkalemia 07/30/2020   Hypertension    Hypomagnesemia 02/05/2023   Hypothyroidism    IDA (iron deficiency anemia)    Migraines    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   a.) TTE 04/24/2023: moderate (MPG 7.5 mmHG)   OSA on CPAP    Pneumonia 07/2020   Pulmonary hypertension (HCC)    a.) TTE 08/17/2022: mild pHTN per echo report; b.) TTE 04/24/2023: RVSP 39.1   Recurrent nephrolithiasis 12/27/2012   Rotator cuff tendinitis, right 11/15/2015   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 MOHS.   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/07/2021   Right zygoma, [facility] 05/02/21   Statin intolerance (myalgias)    Supplemental oxygen  dependent (2-3 L/[place])     Assessment: Patient Reported Symptoms:  Cognitive Cognitive Status: Alert and oriented to person, place, and time,  Insightful and able to interpret abstract concepts, Normal speech and language skills      Neurological Neurological Review of Symptoms: No symptoms reported Neurological Self-Management Outcome: 4 (good)  HEENT HEENT Symptoms Reported: No symptoms reported HEENT Self-Management Outcome: 4 (good)    Cardiovascular Cardiovascular Symptoms Reported: No symptoms reported Cardiovascular Self-Management Outcome: 4 (good)  Respiratory Respiratory Symptoms Reported: No symptoms reported Respiratory Self-Management Outcome: 4 (good)  Endocrine Patient reports the following symptoms related to hypoglycemia or hyperglycemia : No symptoms reported Endocrine Self-Management Outcome: 4 (good)  Gastrointestinal Gastrointestinal Symptoms Reported: No symptoms reported Gastrointestinal Self-Management Outcome: 4 (good) Nutrition Risk Screen (CP): No indicators present  Genitourinary Genitourinary Symptoms Reported: Other Other Genitourinary Symptoms: cramping after HD session Genitourinary Conditions: Chronic kidney disease, Difficulty voiding, Frequency, End-stage renal disease Genitourinary Management Strategies: Activity, Adequate rest, Diet modification, Fluid modification, Hemodialysis, Medical device, Medication therapy Hemodialysis Last Treatment: 10/08/23 Genitourinary Self-Management Outcome: 4 (good)  Integumentary Integumentary Symptoms Reported: No symptoms reported Additional Integumentary Details: improving bruising Skin Self-Management Outcome: 4 (good)  Musculoskeletal Musculoskelatal Symptoms Reviewed: No symptoms reported Musculoskeletal Self-Management Outcome: 4 (good)      Psychosocial Psychosocial Symptoms Reported: No symptoms reported Behavioral Health Self-Management Outcome: 4 (good)   Quality of Family Relationships: helpful, involved, supportive Do you feel physically threatened by others?: No      10/09/2023    2:03 PM  Depression screen PHQ 2/9  Decreased Interest  0  Down, Depressed, Hopeless 0  PHQ - 2 Score 0    There were no vitals filed for this visit.  Medications Reviewed Today   Medications were not reviewed in this encounter     Recommendation:   9//30/25 PCP Follow-up Specialty provider follow-up oncology 11/07/23, pulmonology 11/27/23 Continue Current Plan of Care Getting familiar with his lab values   Follow Up Plan:   Telephone follow up appointment date/time:  11/20/23 1 pm   Uzma Hellmer L. Mcarthur Speedy, RN, BSN, CCM [place]  [facility], [facility] RN Care Manager Direct Dial: [phone]  Fax: [phone]

## 2023-10-09 NOTE — Patient Instructions (Signed)
 Visit Information  Thank you for taking time to visit with me today. Please don't hesitate to contact me if I can be of assistance to you before our next scheduled appointment.  Your next care management appointment is by telephone on 11/20/23 at 1 pm  Remember to eat a banana before dialysis, get a copy of your blood work to see if your potassium is between 3.5 and 5.2 before the session and after the session High potassium food list is at the end of this discharge sheet!  Please call the care guide team at [phone] if you need to cancel, schedule, or reschedule an appointment.   Please call the Suicide and Crisis Lifeline: 988 call the [place]  National Suicide Prevention Lifeline: 510-368-6834 or TTY: 802-011-3069 TTY 828-159-1351) to talk to a trained counselor call 1-800-273-TALK (toll free, 24 hour hotline) call the [facility]: (912)596-7658 call 911 if you are experiencing a Mental Health or Behavioral Health Crisis or need someone to talk to.  Latrese Carolan L. Mcarthur Speedy, RN, BSN, CCM [place]  [facility], [facility] RN Care Manager Direct Dial: [phone]  Fax: [phone]

## 2023-10-10 MED ORDER — CITALOPRAM HYDROBROMIDE 40 MG PO TABS: 40.0000 mg | ORAL_TABLET | Freq: Every day | ORAL | 3 refills | Status: DC

## 2023-10-21 ENCOUNTER — Other Ambulatory Visit: Payer: Self-pay | Admitting: Family

## 2023-10-22 ENCOUNTER — Other Ambulatory Visit: Payer: Self-pay | Admitting: Family

## 2023-10-22 DIAGNOSIS — Z992 Dependence on renal dialysis: Secondary | ICD-10-CM | POA: Diagnosis not present

## 2023-10-22 DIAGNOSIS — N186 End stage renal disease: Secondary | ICD-10-CM | POA: Diagnosis not present

## 2023-10-22 MED ORDER — OMEPRAZOLE 40 MG PO CPDR: 40.0000 mg | DELAYED_RELEASE_CAPSULE | Freq: Every day | ORAL | 3 refills | Status: DC

## 2023-10-23 ENCOUNTER — Other Ambulatory Visit: Payer: Self-pay | Admitting: Family

## 2023-10-23 ENCOUNTER — Other Ambulatory Visit: Payer: Self-pay

## 2023-10-23 MED ORDER — OMEPRAZOLE 40 MG PO CPDR: 40.0000 mg | DELAYED_RELEASE_CAPSULE | Freq: Every day | ORAL | 3 refills | Status: DC

## 2023-10-23 NOTE — Progress Notes (Signed)
 10/23/2023 Name: [name] [name] MRN: [id number] DOB: [birth year]  Chief Complaint  Patient presents with   Medication Management    [name] [name] is a 78 y.o. year old male who presented for a telephone visit.   They were referred to the pharmacist by their PCP for assistance in managing complex medication management.    Subjective:  Care Team: Primary Care Provider: Orlean Alan HERO, FNP ; Next Scheduled Visit: 01/22/24  Medication Access/Adherence  Current Pharmacy:  [id number] [place], [place] - [address] AT [facility] [address] [place] [address] Phone: [phone] Fax: [phone]  [facility] - [place], [place] - [address] [place] [address] Phone: [phone] Fax: [phone]  [id number] - [place], [place] - 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 in the last month, wanting medication review for any necessary changes to dosing/medications -Reports that he responded well to gabapentin  dose reduction to 100mg  2 capsules qam and 1 capsule at bedtime with no issues   Objective:  Lab Results  Component Value Date   HGBA1C 5.0 09/20/2023    Lab Results  Component Value Date   CREATININE 3.44 (H) 09/20/2023   BUN 25 09/20/2023   NA 141 09/20/2023   K 4.3 09/20/2023   CL 100 09/20/2023   CO2 24 09/20/2023    Lab Results  Component Value Date   CHOL 80 (L) 09/20/2023   HDL 40 09/20/2023   LDLCALC 20 09/20/2023   TRIG 103 09/20/2023   CHOLHDL 2.0 09/20/2023    Medications Reviewed Today     Reviewed by Lionell Jon DEL, RPH (Pharmacist) on  10/23/23 at 1314  Med List Status: <None>   Medication Order Taking? Sig Documenting Provider Last Dose Status Informant  acetaminophen  (TYLENOL ) 650 MG CR tablet [id number]  Take 1,300 mg by mouth every 8 (eight) hours as needed for pain. [provider]  Active Spouse/Significant Other  albuterol  (VENTOLIN  HFA) 108 (90 Base) MCG/ACT inhaler [id number]  Inhale 2 puffs into the lungs every 6 (six) hours as needed for wheezing or shortness of breath. [provider]  Active Spouse/Significant Other  Alcohol Swabs (DROPSAFE ALCOHOL PREP) 70 % PADS [id number]  Apply 1 each topically as needed (With repatha and blood sugar checks.). [provider]  Active Spouse/Significant Other  Ascorbic Acid  (VITAMIN C ) 1000 MG tablet [id number]  Take 1,000 mg by mouth at bedtime. [provider]  Active Spouse/Significant Other  aspirin  EC 81 MG tablet [id number]  Take 81 mg by mouth daily. Swallow whole. [provider]  Active   busPIRone  (BUSPAR ) 5 MG tablet [id number]  TAKE 1 TABLET BY MOUTH TWICE DAILY AS NEEDED  Patient taking differently: Take 5 mg by mouth at bedtime.   Orlean Alan HERO, FNP  Active Spouse/Significant Other  calcitRIOL  (ROCALTROL ) 0.25 MCG capsule [id number]  Take 0.25 mcg by mouth daily. [provider]  Active Spouse/Significant Other  citalopram  (CELEXA ) 40 MG tablet [id number]  Take 1 tablet (40 mg total) by mouth daily. Orlean Alan HERO, FNP  Active   Evolocumab 140 MG/ML SOAJ [id number]  Inject 140 mg into the  skin every 14 (fourteen) days. [provider]  Active   ezetimibe  (ZETIA ) 10 MG tablet [id number]  Take 1 tablet (10 mg total) by mouth at bedtime. Orlean Alan HERO, FNP  Active   febuxostat  (ULORIC ) 40 MG tablet [id number]  Take 40 mg by mouth at bedtime. [provider]  Active Spouse/Significant Other  fexofenadine (ALLEGRA) 180 MG tablet [id number]  Take 180 mg by mouth daily as needed for allergies or rhinitis.  [provider]  Active Spouse/Significant Other  Fluticasone -Umeclidin-Vilant (TRELEGY ELLIPTA ) 100-62.5-25 MCG/ACT AEPB [id number]  Inhale 1 Inhalation into the lungs daily at 6 (six) AM.  Patient taking differently: Inhale 1 Inhalation into the lungs daily as needed (shortness of breath).   Orlean Alan HERO, FNP  Active Spouse/Significant Other  gabapentin  (NEURONTIN ) 100 MG capsule [id number]  TAKE 2 CAPSULES TWICE DAILY Orlean Alan HERO, FNP  Active Spouse/Significant Other  gabapentin  (NEURONTIN ) 400 MG capsule [id number]  TAKE 1 CAPSULE BY MOUTH EVERY NIGHT AT BEDTIME Orlean Alan HERO, FNP  Active   metoprolol  succinate (TOPROL -XL) 50 MG 24 hr tablet [id number]  TAKE 1 TABLET EVERY DAY Orlean Alan HERO, FNP  Active Spouse/Significant Other  Multiple Vitamins-Minerals (PRESERVISION AREDS) CAPS [id number]  Take 1 capsule by mouth 2 (two) times daily. [provider]  Active Spouse/Significant Other  omeprazole (PRILOSEC) 40 MG capsule [id number]  Take 1 capsule (40 mg total) by mouth daily. Orlean Alan HERO, FNP  Active   OXYGEN  [id number]  Inhale 2-3 L into the lungs See admin instructions. Used as needed throughout the day and continuous at bedtime [provider]  Active Spouse/Significant Other  pantoprazole  (PROTONIX ) 40 MG tablet [id number]  Take 1 tablet (40 mg total) by mouth every morning. Orlean Alan HERO, FNP  Active   SUPER B COMPLEX/C PO [id number]  Take 1 tablet by mouth daily. [provider]  Active Spouse/Significant Other  torsemide  (DEMADEX ) 20 MG tablet [id number]  Take 20 mg by mouth daily. [provider]  Active Spouse/Significant Other           Med Note JANEAN, JON DEL   Tue Oct 02, 2023  3:51 PM) 2 tablets daily              Assessment/Plan:   CKD/Dialysis -Continue calcitriol  as prescribed by nephrology -Counseled that gabapentin  is above max recommending dose now that he is on dialysis and we need to try to  slowly titrate down. DECREASE to Gabapentin  100mg  2 capsules qam, 1 capsule at bedtime every other day. -[facility]  Follow Up Plan: 2 weeks  JON DEL Lindau, PharmD Clinical Pharmacist [phone]

## 2023-10-24 DIAGNOSIS — N186 End stage renal disease: Secondary | ICD-10-CM | POA: Diagnosis not present

## 2023-10-24 DIAGNOSIS — Z992 Dependence on renal dialysis: Secondary | ICD-10-CM | POA: Diagnosis not present

## 2023-10-24 DIAGNOSIS — D509 Iron deficiency anemia, unspecified: Secondary | ICD-10-CM | POA: Diagnosis not present

## 2023-10-24 DIAGNOSIS — D631 Anemia in chronic kidney disease: Secondary | ICD-10-CM | POA: Diagnosis not present

## 2023-10-25 ENCOUNTER — Other Ambulatory Visit: Payer: Self-pay | Admitting: Family

## 2023-10-25 ENCOUNTER — Other Ambulatory Visit: Payer: Self-pay

## 2023-10-25 MED ORDER — CITALOPRAM HYDROBROMIDE 40 MG PO TABS: 40.0000 mg | ORAL_TABLET | Freq: Every day | ORAL | 3 refills | Status: AC

## 2023-11-02 ENCOUNTER — Telehealth (INDEPENDENT_AMBULATORY_CARE_PROVIDER_SITE_OTHER): Payer: Self-pay

## 2023-11-02 NOTE — Telephone Encounter (Signed)
 Spoke with the patient's spouse and he is scheduled with Dr. Marea on 11/12/23 with a 2:00 pm arrival time to the [facility] for a permcath removal. Pre-procedure instructions were discussed and will be sent to Mychart and mailed.

## 2023-11-03 DIAGNOSIS — J449 Chronic obstructive pulmonary disease, unspecified: Secondary | ICD-10-CM | POA: Diagnosis not present

## 2023-11-06 ENCOUNTER — Other Ambulatory Visit: Payer: Self-pay

## 2023-11-06 NOTE — Progress Notes (Signed)
   11/06/2023 Name: [name] [name] MRN: [id number] DOB: [birth year]  Chief Complaint  Patient presents with   Medication Management    [name] [name] is a 78 y.o. year old male who presented for a telephone visit.   They were referred to the pharmacist by their PCP for assistance in managing complex medication management.    Subjective:  Care Team: Primary Care Provider: Orlean Alan HERO, FNP ; Next Scheduled Visit: 01/22/24  Medication Access/Adherence  Current Pharmacy:  [id number] [place], [place] - [address] AT [facility] [address] [place] [address] [place] [address] Phone: [phone] Fax: [phone]  [facility] - [place], [place] - [address] [place] [address] Phone: [phone] Fax: [phone]  [id number] - [place], [place] - [address] [place] [address] Phone: [phone] Fax: [phone]   Patient reports affordability concerns with their medications: No  Patient reports access/transportation concerns to their pharmacy: No  Patient reports adherence concerns with their medications:  No     CKD-Dialysis -Patient still having cramps with dialysis, has started an OTC supplement for leg cramps at recommendation of Dialysis Center -Self-stopped the gabapentin  the last 10-14 days, reports no difference in nerve pain control for feet   Objective:  Lab Results  Component Value Date   HGBA1C 5.0 09/20/2023    Lab Results  Component Value Date   CREATININE 3.44 (H) 09/20/2023   BUN 25 09/20/2023   NA 141 09/20/2023   K 4.3 09/20/2023   CL 100 09/20/2023   CO2 24 09/20/2023    Lab Results  Component Value Date   CHOL 80 (L) 09/20/2023   HDL 40 09/20/2023   LDLCALC 20 09/20/2023   TRIG 103 09/20/2023   CHOLHDL 2.0 09/20/2023    Medications Reviewed Today   Medications were not reviewed in this encounter        Assessment/Plan:   CKD/Dialysis -Patient prefers to not take any medication for the nerve pain at this time. I will reach back out in 1 month for follow up, appt scheduled. Counseled patient to reach out sooner if pain worsens and he wants to discuss treatment options -Okay to continue leg cramp OTC supplement   Follow Up Plan: 2 weeks  Jon VEAR Lindau, PharmD Clinical Pharmacist [phone]

## 2023-11-07 ENCOUNTER — Other Ambulatory Visit: Payer: Medicare HMO

## 2023-11-07 ENCOUNTER — Ambulatory Visit: Payer: Medicare HMO | Admitting: Oncology

## 2023-11-12 ENCOUNTER — Encounter
Admission: RE | Disposition: A | Discharge status: Home / Self Care | Payer: Self-pay | Source: Home / Self Care | Attending: Vascular Surgery

## 2023-11-12 ENCOUNTER — Ambulatory Visit
Admission: RE | Admit: 2023-11-12 | Discharge: 2023-11-12 | Disposition: A | Discharge status: Home / Self Care | Attending: Vascular Surgery | Admitting: Vascular Surgery

## 2023-11-12 DIAGNOSIS — Z452 Encounter for adjustment and management of vascular access device: Secondary | ICD-10-CM | POA: Diagnosis not present

## 2023-11-12 DIAGNOSIS — N186 End stage renal disease: Secondary | ICD-10-CM

## 2023-11-12 HISTORY — PX: DIALYSIS/PERMA CATHETER REMOVAL: CATH118289

## 2023-11-12 SURGERY — DIALYSIS/PERMA CATHETER REMOVAL
Anesthesia: LOCAL

## 2023-11-12 MED ORDER — LIDOCAINE-EPINEPHRINE (PF) 1 %-1:200000 IJ SOLN
INTRAMUSCULAR | Status: DC | PRN
Start: 2023-11-12 — End: 2023-11-12
  Administered 2023-11-12: 20 mL

## 2023-11-12 SURGICAL SUPPLY — 2 items
CHLORAPREP W/TINT 26 (MISCELLANEOUS) IMPLANT
TRAY LACERAT/PLASTIC (MISCELLANEOUS) IMPLANT

## 2023-11-12 NOTE — Op Note (Signed)
 Operative Note     Preoperative diagnosis:   1. ESRD with functional permanent access  Postoperative diagnosis:  1. ESRD with functional permanent access  Procedure:  Removal of right jugular Permcath  Surgeon:  Selinda Gu, MD  Anesthesia:  Local  EBL:  Minimal  Indication for the Procedure:  The patient has a functional permanent dialysis access and no longer needs their permcath.  This can be removed.  Risks and benefits are discussed and informed consent is obtained.  Description of the Procedure:  The patient's right neck, chest and existing catheter were sterilely prepped and draped. The area around the catheter was anesthetized copiously with 1% lidocaine . The catheter was dissected out with curved hemostats until the cuff was freed from the surrounding fibrous sheath. The fiber sheath was transected, and the catheter was then removed in its entirety using gentle traction. Pressure was held and sterile dressings were placed. The patient tolerated the procedure well and was taken to the recovery room in stable condition.     Selinda Gu  11/12/2023, 3:08 PM This note was created with Dragon Medical transcription system. Any errors in dictation are purely unintentional.

## 2023-11-12 NOTE — Discharge Instructions (Signed)
 Tunneled Catheter Removal, Care After Refer to this sheet in the next few weeks. These instructions provide you with information about caring for yourself after your procedure. Your health care provider may also give you more specific instructions. Your treatment has been planned according to current medical practices, but problems sometimes occur. Call your health care provider if you have any problems or questions after your procedure. What can I expect after the procedure? After the procedure, it is common to have: Some mild redness, swelling, and pain around your catheter site.   Follow these instructions at home: Incision care  Check your removal site  every day for signs of infection. Check for: More redness, swelling, or pain. More fluid or blood. Warmth. Pus or a bad smell. Remove your dressing in 48hrs leave open to air  Activity  Return to your normal activities as told by your health care provider. Ask your health care provider what activities are safe for you. Do not lift anything that is heavier than 10 lb (4.5 kg) for 3 days  You may shower tomorrow  Contact a health care provider if: You have more fluid or blood coming from your removal site You have more redness, swelling, or pain at your incisions or around the area where your catheter was removed Your removal site feel warm to the touch. You feel unusually weak. You feel nauseous.. Get help right away if You have swelling in your arm, shoulder, neck, or face. You develop chest pain. You have difficulty breathing. You feel dizzy or light-headed. You have pus or a bad smell coming from your removal site You have a fever. You develop bleeding from your removal site, and your bleeding does not stop. This information is not intended to replace advice given to you by your health care provider. Make sure you discuss any questions you have with your health care provider. Document Released: 03/27/2012 Document Revised:  12/12/2015 Document Reviewed: 01/04/2015 Elsevier Interactive Patient Education  2017 [employer].

## 2023-11-12 NOTE — H&P (Signed)
 [facility] Admission History & Physical  MRN : [id number]  [name] [name] is a 78 y.o. (07-15-1945) male who presents with chief complaint of No chief complaint on file. [name] [name]  History of Present Illness: I am asked to evaluate the patient by the dialysis center. The patient was sent here because they have a nonfunctioning tunneled catheter and a functioning arm access.  The patient reports they're not been any problems with any of their dialysis runs. They are reporting good flows with good parameters at dialysis.   Patient denies pain or tenderness overlying the access.  There is no pain with dialysis.  The patient denies hand pain or finger pain consistent with steal syndrome.  No fevers or chills while on dialysis.    No current facility-administered medications for this encounter.    Past Medical History:  Diagnosis Date   Accidental cut, puncture, perforation, or hemorrhage during heart catheterization 12/27/2012   Acute kidney injury superimposed on stage 4 chronic kidney disease (HCC) 07/27/2020   Acute on chronic combined systolic and diastolic CHF (congestive heart failure) (HCC) 04/25/2023   Acute on chronic respiratory failure with hypoxia (HCC) 04/22/2023   Adopted    AKI (acute kidney injury) (HCC) 02/05/2023   ANA positive 07/12/2015   Aortic atherosclerosis (HCC)    Aortic stenosis, severe    a.) s/p TAVR on 08/01/2016: 29 mm CoreValve Evolut placed   Arthritis    Bacteremia due to Streptococcus 04/24/2023   Basal cell carcinoma 01/04/2010   Right sup. post. helix. Excised 03/02/2010   BCC (basal cell carcinoma of skin) 11/24/2020   R neck below the ear, EDC   BPH (benign prostatic hyperplasia)    CAD (coronary artery disease) 12/26/2012   a.) LHC 12/26/2012: 50% mLAD, 80% mLCx, 100% pRCA --> CVTS at [facility] --> decided again revascularization due to insig LAD disease and the fact that RCA lesion was chronic   CAP (community acquired  pneumonia) 07/26/2020   Chronic respiratory failure with hypoxia (HCC)    Claudication of both lower extremities (HCC)    Complete tear of right rotator cuff 11/15/2015   Complex tear of lateral meniscus of left knee as current injury 12/06/2020   Complex tear of medial meniscus of left knee as current injury 10/18/2020   COPD (chronic obstructive pulmonary disease) (HCC)    Diarrhea 02/04/2023   Diet-controlled type 2 diabetes mellitus (HCC)    Diverticulosis    Dyspnea    Electrolyte abnormality 02/04/2023   ESRD (end stage renal disease) (HCC)    GERD (gastroesophageal reflux disease)    Gout    Heart murmur    HFrEF (heart failure with reduced ejection fraction) (HCC)    a.) s/p TAVR on 08/01/2016: 29 mm CoreValve Evolut placed   History of bilateral cataract extraction    History of hiatal hernia    HLD (hyperlipidemia)    Hyperkalemia 07/30/2020   Hypertension    Hypomagnesemia 02/05/2023   Hypothyroidism    IDA (iron deficiency anemia)    Migraines    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   a.) TTE 04/24/2023: moderate (MPG 7.5 mmHG)   OSA on CPAP    Pneumonia 07/2020   Pulmonary hypertension (HCC)    a.) TTE 08/17/2022: mild pHTN per echo report; b.) TTE 04/24/2023: RVSP 39.1   Recurrent nephrolithiasis 12/27/2012   Rotator cuff tendinitis, right 11/15/2015   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 MOHS.   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/[place])     Past Surgical History:  Procedure Laterality Date    AORTIC VALVE REPLACEMENT N/A 08/01/2016   29 mm CoreValve Evolut; Location: [facility]; Surgeon: Reyes Fruits, MD   AV FISTULA PLACEMENT Right 06/27/2023   Procedure: ARTERIOVENOUS (AV) FISTULA CREATION (BRACHIALCEPHALIC);  Surgeon: Jama Cordella MATSU, MD;  Location: [facility] ORS;  Service: Vascular;  Laterality: Right;   CARDIAC CATHETERIZATION N/A 12/26/2012   2v CAD; retained pigtail in myocardium --> transferred to [facility]; Location: [facility]; Surgeon: Vita Bathe, MD   CATARACT EXTRACTION W/PHACO Left 10/03/2022   Procedure: CATARACT EXTRACTION PHACO AND INTRAOCULAR LENS PLACEMENT (IOC) LEFT DIABETIC 8.22 00:55.5;  Surgeon: Jaye Fallow, MD;  Location: [facility];  Service: Ophthalmology;  Laterality: Left;  sleep apnea   CATARACT EXTRACTION W/PHACO Right 10/17/2022   Procedure: CATARACT EXTRACTION PHACO AND INTRAOCULAR LENS PLACEMENT (IOC) RIGHT DIABETIC;  Surgeon: Jaye Fallow, MD;  Location: [facility];  Service: Ophthalmology;  Laterality: Right;  4.79 0:37.4   COLONOSCOPY     COLONOSCOPY N/A 04/28/2023   Procedure: COLONOSCOPY;  Surgeon: Toledo, Ladell POUR, MD;  Location: [facility] ENDOSCOPY;  Service: Gastroenterology;  Laterality: N/A;   DIALYSIS/PERMA CATHETER INSERTION N/A 08/29/2023   Procedure: DIALYSIS/PERMA CATHETER INSERTION;  Surgeon: Jama Cordella MATSU, MD;  Location: [facility] INVASIVE CV [facility];  Service: Cardiovascular;  Laterality: N/A;   KNEE ARTHROSCOPY WITH MEDIAL MENISECTOMY Left 12/02/2020   Procedure: KNEE ARTHROSCOPY WITH DEBRIDEMENT AND PARTIAL MEDIAL AND LATERAL MENISECTOMY;  Surgeon: Edie Norleen PARAS, MD;  Location: [facility] ORS;  Service: Orthopedics;  Laterality: Left;   LITHOTRIPSY     PERCUTANEOUS REMOVAL INTRA-AORTIC BALLOON CATH N/A 12/26/2012   Procedure: PERCUTANEOUS REMOVAL INTRA-AORTIC BALLOON CATH; Surgeon: Lang Lannie Salter, MD; Location: [facility]PER[facility]; Service: Cardiothoracic   POLYPECTOMY  04/28/2023   Procedure: POLYPECTOMY;  Surgeon: Aundria, Ladell POUR,  MD;  Location: [facility] ENDOSCOPY;  Service: Gastroenterology;;   RIGHT HEART CATH Right 07/13/2016   Location: [facility]; Surgeon: Ozell Estelle, MD   TEE WITHOUT CARDIOVERSION Right 04/26/2023   Procedure: TRANSESOPHAGEAL ECHOCARDIOGRAM (TEE);  Surgeon: Bathe Denyse LABOR, MD;  Location: [facility] ORS;  Service: Cardiovascular;  Laterality: Right;   TRANSESOPHAGEAL ECHOCARDIOGRAM N/A 12/26/2012   Procedure: TRANSESOPHAGEAL ECHOCARDIOGRAPHY; Surgeon: Lang Lannie Salter, MD; Location: [facility]; Service: Cardiothoracic   UMBILICAL HERNIA REPAIR N/A 02/13/2014   Procedure: LAPAROSCOPIC UMBILICAL HERNIA REPAIR; Surgeon: Shanda ONEIDA Salter, MD; Location: [facility]; Service: General Surgery     Social History   Tobacco Use   Smoking status: Former    Current packs/day: 0.00    Average packs/day: 2.0 packs/day for 54.0 years (108.0 ttl pk-yrs)    Types: Cigarettes    Start date: 42    Quit date: 2010    Years since quitting: 15.5   Smokeless tobacco: Former    Types: Chew    Quit date: 05/16/2004  Vaping Use   Vaping status: Never Used  Substance Use Topics   Alcohol use: Yes    Comment: rare beer   Drug use: Never  Family History  Adopted: Yes  Problem Relation Age of Onset   Varicose Veins Daughter    Obesity Daughter    Hyperlipidemia Daughter    Diabetes Daughter    COPD Daughter    Depression Daughter    Asthma Daughter    Arthritis Son    Diabetes Son    Hypertension Son     No family history of bleeding or clotting disorders, autoimmune disease or porphyria  Allergies  Allergen Reactions   Nsaids Other (See Comments)    Avoid due to kidney disease    Statins Other (See Comments)    Myalgias   Nexletol [Bempedoic Acid] Diarrhea    fatigue   Pedi-Pre Tape Spray [Wound Dressing Adhesive]    Silver Other (See Comments)   Tegaderm High Gelling Alginate [Wound Dressings] Rash     REVIEW OF SYSTEMS (Negative unless checked)  Constitutional: [] Weight loss   [] Fever  [] Chills Cardiac: [] Chest pain   [] Chest pressure   [] Palpitations   [] Shortness of breath when laying flat   [x] Shortness of breath at rest   [x] Shortness of breath with exertion. Vascular:  [] Pain in legs with walking   [] Pain in legs at rest   [] Pain in legs when laying flat   [] Claudication   [] Pain in feet when walking  [] Pain in feet at rest  [] Pain in feet when laying flat   [] History of DVT   [] Phlebitis   [] Swelling in legs   [] Varicose veins   [] Non-healing ulcers Pulmonary:   [x] Uses home oxygen    [] Productive cough   [] Hemoptysis   [] Wheeze  [x] COPD   [] Asthma Neurologic:  [] Dizziness  [] Blackouts   [] Seizures   [] History of stroke   [] History of TIA  [] Aphasia   [] Temporary blindness   [] Dysphagia   [] Weakness or numbness in arms   [] Weakness or numbness in legs Musculoskeletal:  [x] Arthritis   [] Joint swelling   [x] Joint pain   [] Low back pain Hematologic:  [] Easy bruising  [] Easy bleeding   [] Hypercoagulable state   [x] Anemic  [] Hepatitis Gastrointestinal:  [] Blood in stool   [] Vomiting blood  [] Gastroesophageal reflux/heartburn   [] Difficulty swallowing. Genitourinary:  [x] Chronic kidney disease   [] Difficult urination  [] Frequent urination  [] Burning with urination   [] Blood in urine Skin:  [] Rashes   [] Ulcers   [] Wounds Psychological:  [] History of anxiety   []  History of major depression.  Physical Examination  Vitals:   11/12/23 1427  BP: (!) 137/52  Pulse: 68  Resp: 14  Temp: 98.4 F (36.9 C)  TempSrc: Axillary  SpO2: 95%   There is no height or weight on file to calculate BMI. Gen: WD/WN, NAD Head: [place]/AT, No temporalis wasting.  Ear/Nose/Throat: Hearing grossly intact, nares w/o erythema or drainage, oropharynx w/o Erythema/Exudate,  Eyes: Conjunctiva clear, sclera non-icteric Neck: Trachea midline.  No JVD.  Pulmonary:  Good air movement, respirations not labored, no use of accessory muscles.  Cardiac: RRR, normal S1, S2. Vascular: right jugular  permcath without erythema or drainage Vessel Right Left  Radial Palpable Palpable   Musculoskeletal: M/S 5/5 throughout.  Extremities without ischemic changes.  No deformity or atrophy.  Neurologic: Sensation grossly intact in extremities.  Symmetrical.  Speech is fluent. Motor exam as listed above. Psychiatric: Judgment intact, Mood & affect appropriate for pt's clinical situation. Dermatologic: No rashes or ulcers noted.  No cellulitis or open wounds.    CBC Lab Results  Component Value Date   WBC 7.8 09/20/2023   HGB 10.7 (L)  09/20/2023   HCT 32.9 (L) 09/20/2023   MCV 99 (H) 09/20/2023   PLT 139 (L) 09/20/2023    BMET    Component Value Date/Time   NA 141 09/20/2023 1003   NA 138 07/27/2014 1247   K 4.3 09/20/2023 1003   K 4.2 07/27/2014 1247   CL 100 09/20/2023 1003   CL 105 07/27/2014 1247   CO2 24 09/20/2023 1003   CO2 26 07/27/2014 1247   GLUCOSE 91 09/20/2023 1003   GLUCOSE 92 08/29/2023 0444   GLUCOSE 135 (H) 07/27/2014 1247   BUN 25 09/20/2023 1003   BUN 28 (H) 07/27/2014 1247   CREATININE 3.44 (H) 09/20/2023 1003   CREATININE 1.64 (H) 07/27/2014 1247   CALCIUM 8.6 09/20/2023 1003   CALCIUM 8.7 (L) 07/27/2014 1247   GFRNONAA 12 (L) 08/29/2023 0444   GFRNONAA 42 (L) 07/27/2014 1247   GFRAA 23 (L) 07/24/2016 1919   GFRAA 48 (L) 07/27/2014 1247   CrCl cannot be calculated (Patient's most recent lab result is older than the maximum 21 days allowed.).  COAG Lab Results  Component Value Date   INR 1.1 04/21/2023   INR 1.0 07/27/2014    Radiology No results found.  Assessment/Plan 1.  Complication dialysis device with non-functional permcath:  Patient's Tunneled catheter is not being used. The patient has an extremity access that is functioning well. Therefore, the patient will undergo removal of the tunneled catheter under local anesthesia.  The risks and benefits were described to the patient.  All questions were answered.  The patient agrees to proceed  with angiography and intervention. Potassium will be drawn to ensure that it is an appropriate level prior to performing intervention. 2.  End-stage renal disease requiring hemodialysis:  Patient will continue dialysis therapy without further interruption if a successful intervention is not achieved then a tunneled catheter will be placed. Dialysis has already been arranged. 3.  Hypertension:  Patient will continue medical management; nephrology is following no changes in oral medications. 4. Diabetes mellitus:  Glucose will be monitored and oral medications been held this morning once the patient has undergone the patient's procedure po intake will be reinitiated and again Accu-Cheks will be used to assess the blood glucose level and treat as needed. The patient will be restarted on the patient's usual hypoglycemic regime 5.  Coronary artery disease:  EKG will be monitored. Nitrates will be used if needed. The patient's oral cardiac medications will be continued.    Selinda Gu, MD  11/12/2023 2:29 PM

## 2023-11-13 ENCOUNTER — Encounter: Payer: Self-pay | Admitting: Vascular Surgery

## 2023-11-17 ENCOUNTER — Other Ambulatory Visit: Payer: Self-pay | Admitting: Family

## 2023-11-20 ENCOUNTER — Other Ambulatory Visit: Payer: Self-pay | Admitting: *Deleted

## 2023-11-20 NOTE — Patient Instructions (Signed)
 Visit Information  Thank you for taking time to visit with me today. Please don't hesitate to contact me if I can be of assistance to you before our next scheduled appointment.  Your next care management appointment is on 12/18/23 at 3 pm  Do not forget to eat potassium enriched foods or take potassium tablets for cramps  Please call the care guide team at [phone] if you need to cancel, schedule, or reschedule an appointment.   Please call the Suicide and Crisis Lifeline: 988 call the [place]  National Suicide Prevention Lifeline: 787-886-3897 or TTY: 813-250-6227 TTY 925-205-5113) to talk to a trained counselor call 1-800-273-TALK (toll free, 24 hour hotline) call the [facility]: 517-181-6047 call 911 if you are experiencing a Mental Health or Behavioral Health Crisis or need someone to talk to.  Ryian Lynde L. Ramonita, RN, BSN, CCM [place]  [facility], [facility] RN Care Manager Direct Dial: [phone]  Fax: [phone]

## 2023-11-20 NOTE — Patient Outreach (Signed)
 Complex Care Management   Visit Note  12/18/2023 updated entry for 11/20/23  Name:  [name] [name] MRN: [id number] DOB: 30-Jan-1946  Situation: Referral received for Complex Care Management related to Heart Failure and ESRD I obtained verbal consent from Caregiver.  Visit completed with [name] [name], & [name] [name],  on the phone  Background:   Past Medical History:  Diagnosis Date   Accidental cut, puncture, perforation, or hemorrhage during heart catheterization 12/27/2012   Acute kidney injury superimposed on stage 4 chronic kidney disease (HCC) 07/27/2020   Acute on chronic combined systolic and diastolic CHF (congestive heart failure) (HCC) 04/25/2023   Acute on chronic respiratory failure with hypoxia (HCC) 04/22/2023   Adopted    AKI (acute kidney injury) (HCC) 02/05/2023   ANA positive 07/12/2015   Aortic atherosclerosis (HCC)    Aortic stenosis, severe    a.) s/p TAVR on 08/01/2016: 29 mm CoreValve Evolut placed   Arthritis    Bacteremia due to Streptococcus 04/24/2023   Basal cell carcinoma 01/04/2010   Right sup. post. helix. Excised 03/02/2010   BCC (basal cell carcinoma of skin) 11/24/2020   R neck below the ear, EDC   BPH (benign prostatic hyperplasia)    CAD (coronary artery disease) 12/26/2012   a.) LHC 12/26/2012: 50% mLAD, 80% mLCx, 100% pRCA --> CVTS at [facility] --> decided again revascularization due to insig LAD disease and the fact that RCA lesion was chronic   CAP (community acquired pneumonia) 07/26/2020   Chronic respiratory failure with hypoxia (HCC)    Claudication of both lower extremities (HCC)    Complete tear of right rotator cuff 11/15/2015   Complex tear of lateral meniscus of left knee as current injury 12/06/2020   Complex tear of medial meniscus of left knee as current injury 10/18/2020   COPD (chronic obstructive pulmonary disease) (HCC)    Diarrhea 02/04/2023   Diet-controlled type 2 diabetes mellitus (HCC)    Diverticulosis    Dyspnea     Electrolyte abnormality 02/04/2023   ESRD (end stage renal disease) (HCC)    GERD (gastroesophageal reflux disease)    Gout    Heart murmur    HFrEF (heart failure with reduced ejection fraction) (HCC)    a.) s/p TAVR on 08/01/2016: 29 mm CoreValve Evolut placed   History of bilateral cataract extraction    History of hiatal hernia    HLD (hyperlipidemia)    Hyperkalemia 07/30/2020   Hypertension    Hypomagnesemia 02/05/2023   Hypothyroidism    IDA (iron deficiency anemia)    Migraines    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   a.) TTE 04/24/2023: moderate (MPG 7.5 mmHG)   OSA on CPAP    Pneumonia 07/2020   Pulmonary hypertension (HCC)    a.) TTE 08/17/2022: mild pHTN per echo report; b.) TTE 04/24/2023: RVSP 39.1   Recurrent nephrolithiasis 12/27/2012   Rotator cuff tendinitis, right 11/15/2015   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 MOHS.   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/[place])  Assessment: Patient Reported Symptoms:  Cognitive Cognitive Status: No symptoms reported      Neurological Neurological Review of Symptoms: Weakness, Dizziness (getting dizzy when getting up,) Neurological Comment: stopped neurontin   HEENT HEENT Symptoms Reported: Sudden change or loss of vision (gets shots in his eyes,)      Cardiovascular Cardiovascular Symptoms Reported: No symptoms reported Cardiovascular Self-Management Outcome: 4 (good)  Respiratory Respiratory Symptoms Reported: No symptoms reported Respiratory  Self-Management Outcome: 4 (good)  Endocrine Endocrine Symptoms Reported: No symptoms reported Endocrine Self-Management Outcome: 4 (good)  Gastrointestinal Gastrointestinal Symptoms Reported: Flatulence, Reflux/heartburn      Genitourinary Additional Genitourinary Details: cramps when finish HD, chest port out, Tolerating arm port    Integumentary Integumentary Symptoms Reported: No symptoms reported Skin Self-Management Outcome: 4 (good)  Musculoskeletal Musculoskelatal Symptoms Reviewed: Other Other Musculoskeletal Symptoms: cramping after dialysis treatment Musculoskeletal Management Strategies: Adequate rest, Medication therapy, Medical device, Routine screening Musculoskeletal Self-Management Outcome: 3 (uncertain)      Psychosocial Psychosocial Symptoms Reported: No symptoms reported   Techniques to Cope with Loss/Stress/Change: Diversional activities (crossword, games on computer)        11/20/2023    2:13 PM  Depression screen PHQ 2/9  Decreased Interest 0  Down, Depressed, Hopeless 0  PHQ - 2 Score 0    There were no vitals filed for this visit.  Medications Reviewed Today     Reviewed by Ramonita Suzen CROME, RN (Registered Nurse) on 11/20/23 at 1334  Med List Status: <None>   Medication Order Taking? Sig Documenting Provider Last Dose Status Informant  acetaminophen  (TYLENOL ) 650 MG CR tablet [id number]  Take 1,300 mg by mouth every 8 (eight) hours as needed for pain. [provider]  Active Spouse/Significant Other  albuterol  (VENTOLIN  HFA) 108 (90 Base) MCG/ACT inhaler [id number]  Inhale 2 puffs into the lungs every 6 (six) hours as needed for wheezing or shortness of breath. [provider]  Active Spouse/Significant Other  Alcohol Swabs (DROPSAFE ALCOHOL PREP) 70 % PADS [id number]  Apply 1 each topically as needed (With repatha and blood sugar checks.). [provider]  Active Spouse/Significant Other  Ascorbic Acid  (VITAMIN C ) 1000 MG  tablet [id number]  Take 1,000 mg by mouth at bedtime. [provider]  Active Spouse/Significant Other  aspirin  EC 81 MG tablet [id number]  Take 81 mg by mouth daily. Swallow whole. [provider]  Active   busPIRone  (BUSPAR ) 5 MG tablet [id number]  Take 1 tablet (5 mg total) by mouth at bedtime. Orlean Alan HERO, FNP  Active   calcitRIOL  (ROCALTROL ) 0.25 MCG capsule [id number]  Take 0.25 mcg by mouth daily. [provider]  Active Spouse/Significant Other  citalopram  (CELEXA ) 40 MG tablet [id number]  Take 1 tablet (40 mg total) by mouth daily. Orlean Alan HERO, FNP  Active   Evolocumab 140 MG/ML SOAJ [id number]  Inject 140 mg into the skin every 14 (fourteen) days. [provider]  Active   ezetimibe  (ZETIA ) 10 MG tablet [id number]  TAKE 1 TABLET BY MOUTH AT BEDTIME Orlean Alan HERO, FNP  Active   febuxostat  (ULORIC ) 40 MG tablet [id number]  Take 40 mg by mouth at bedtime. [provider]  Active Spouse/Significant Other  fexofenadine (ALLEGRA) 180 MG tablet [id number]  Take 180 mg by mouth daily as needed for allergies or rhinitis. [provider]  Active Spouse/Significant Other  Fluticasone -Umeclidin-Vilant (TRELEGY ELLIPTA ) 100-62.5-25 MCG/ACT AEPB [id number]  Inhale 1 Inhalation into the lungs daily at 6 (six) AM.  Patient taking differently: Inhale 1  Inhalation into the lungs daily as needed (shortness of breath).   Orlean Alan HERO, FNP  Active Spouse/Significant Other  gabapentin  (NEURONTIN ) 100 MG capsule [id number]  TAKE 2 CAPSULES TWICE DAILY  Patient not taking: Reported on 11/06/2023   Orlean Alan HERO, FNP  Active Spouse/Significant Other  gabapentin  (NEURONTIN ) 400 MG capsule [id number]  TAKE 1 CAPSULE BY MOUTH EVERY NIGHT AT BEDTIME  Patient not taking: Reported on 11/20/2023   Orlean Alan HERO, FNP  Active   metoprolol  succinate (TOPROL -XL) 50 MG 24 hr tablet [id number]  TAKE 1 TABLET EVERY DAY Orlean Alan HERO, FNP  Active  Spouse/Significant Other  Multiple Vitamins-Minerals (PRESERVISION AREDS) CAPS [id number]  Take 1 capsule by mouth 2 (two) times daily. [provider]  Active Spouse/Significant Other  omeprazole  (PRILOSEC) 40 MG capsule [id number]  Take 1 capsule (40 mg total) by mouth daily. Orlean Alan HERO, FNP  Active   OXYGEN  [id number] Yes Inhale 2-3 L into the lungs See admin instructions. Used as needed throughout the day and continuous at bedtime [provider]  Active Spouse/Significant Other  SUPER B COMPLEX/C PO [id number]  Take 1 tablet by mouth daily. [provider]  Active Spouse/Significant Other  torsemide  (DEMADEX ) 20 MG tablet [id number]  Take 20 mg by mouth daily. [provider]  Active Spouse/Significant Other           Med Note JANEAN JON DEL   Tue Oct 02, 2023  3:51 PM) 2 tablets daily            Recommendation:   PCP Follow-up Specialty provider follow-up vascular surgeon, pulmonologist Dermatologist, podiatrist Continue Current Plan of Care Continue hemodialysis Mondays, Wednesdays, Fridays  Follow Up Plan:   Telephone follow up appointment date/time:  12/18/23 3 pm   Marvion Bastidas L. Ramonita, RN, BSN, CCM [place]  [facility], [facility] RN Care Manager Direct Dial: [phone]  Fax: [phone]

## 2023-11-22 ENCOUNTER — Ambulatory Visit (INDEPENDENT_AMBULATORY_CARE_PROVIDER_SITE_OTHER): Admitting: Family

## 2023-11-22 ENCOUNTER — Ambulatory Visit
Admission: RE | Admit: 2023-11-22 | Discharge: 2023-11-22 | Disposition: A | Discharge status: Home / Self Care | Attending: Family | Admitting: Family

## 2023-11-22 ENCOUNTER — Ambulatory Visit
Admission: RE | Admit: 2023-11-22 | Discharge: 2023-11-22 | Disposition: A | Discharge status: Home / Self Care | Source: Ambulatory Visit | Attending: Family | Admitting: Family

## 2023-11-22 VITALS — BP 120/58 | HR 66 | Temp 97.9°F | Ht 65.0 in | Wt 189.2 lb

## 2023-11-22 DIAGNOSIS — E139 Other specified diabetes mellitus without complications: Secondary | ICD-10-CM

## 2023-11-22 DIAGNOSIS — K5900 Constipation, unspecified: Secondary | ICD-10-CM

## 2023-11-22 DIAGNOSIS — Z0130 Encounter for examination of blood pressure without abnormal findings: Secondary | ICD-10-CM

## 2023-11-22 DIAGNOSIS — N2889 Other specified disorders of kidney and ureter: Secondary | ICD-10-CM | POA: Diagnosis not present

## 2023-11-22 DIAGNOSIS — Q438 Other specified congenital malformations of intestine: Secondary | ICD-10-CM | POA: Diagnosis not present

## 2023-11-22 DIAGNOSIS — R5383 Other fatigue: Secondary | ICD-10-CM

## 2023-11-22 DIAGNOSIS — K439 Ventral hernia without obstruction or gangrene: Secondary | ICD-10-CM | POA: Diagnosis not present

## 2023-11-22 DIAGNOSIS — N186 End stage renal disease: Secondary | ICD-10-CM | POA: Diagnosis not present

## 2023-11-22 DIAGNOSIS — Z992 Dependence on renal dialysis: Secondary | ICD-10-CM | POA: Diagnosis not present

## 2023-11-22 LAB — POCT CBG (FASTING - GLUCOSE)-MANUAL ENTRY: Glucose Fasting, POC: 168 mg/dL — AB (ref 70–99)

## 2023-11-23 DIAGNOSIS — D631 Anemia in chronic kidney disease: Secondary | ICD-10-CM | POA: Diagnosis not present

## 2023-11-23 DIAGNOSIS — N186 End stage renal disease: Secondary | ICD-10-CM | POA: Diagnosis not present

## 2023-11-23 DIAGNOSIS — Z992 Dependence on renal dialysis: Secondary | ICD-10-CM | POA: Diagnosis not present

## 2023-11-23 DIAGNOSIS — D509 Iron deficiency anemia, unspecified: Secondary | ICD-10-CM | POA: Diagnosis not present

## 2023-11-23 LAB — CMP14+EGFR
ALT: 8 IU/L (ref 0–44)
AST: 16 IU/L (ref 0–40)
Albumin: 3.8 g/dL (ref 3.8–4.8)
Alkaline Phosphatase: 121 IU/L (ref 44–121)
BUN/Creatinine Ratio: 7 — ABNORMAL LOW (ref 10–24)
BUN: 30 mg/dL — ABNORMAL HIGH (ref 8–27)
Bilirubin Total: 0.3 mg/dL (ref 0.0–1.2)
CO2: 22 mmol/L (ref 20–29)
Calcium: 9.5 mg/dL (ref 8.6–10.2)
Chloride: 97 mmol/L (ref 96–106)
Creatinine, Ser: 4.22 mg/dL — ABNORMAL HIGH (ref 0.76–1.27)
Globulin, Total: 2.9 g/dL (ref 1.5–4.5)
Glucose: 86 mg/dL (ref 70–99)
Potassium: 4.4 mmol/L (ref 3.5–5.2)
Sodium: 138 mmol/L (ref 134–144)
Total Protein: 6.7 g/dL (ref 6.0–8.5)
eGFR: 14 mL/min/1.73 — ABNORMAL LOW (ref >59)

## 2023-11-23 LAB — IRON,TIBC AND FERRITIN PANEL
Ferritin: 529 ng/mL — ABNORMAL HIGH (ref 30–400)
Iron Saturation: 31 % (ref 15–55)
Iron: 62 ug/dL (ref 38–169)
Total Iron Binding Capacity: 202 ug/dL — ABNORMAL LOW (ref 250–450)
UIBC: 140 ug/dL (ref 111–343)

## 2023-11-25 ENCOUNTER — Encounter: Payer: Self-pay | Admitting: Family

## 2023-11-25 NOTE — Assessment & Plan Note (Signed)
 Patient is seen by cardiology, who manage this condition.  He is well controlled with current therapy.   Will defer to them for further changes to plan of care.

## 2023-11-25 NOTE — Assessment & Plan Note (Signed)
 Blood pressure well controlled with current medications.  Continue current therapy.  Will reassess at follow up.

## 2023-11-25 NOTE — Assessment & Plan Note (Signed)
 Checking labs today.  Holding Spironolactone .   Will continue supplements as needed.

## 2023-11-25 NOTE — Assessment & Plan Note (Signed)
 Checking labs today.  Continue current therapy for lipid control. Will modify as needed based on labwork results.

## 2023-11-25 NOTE — Assessment & Plan Note (Signed)
 Continue current meds.  Will adjust as needed based on results.  The patient is asked to make an attempt to improve diet and exercise patterns to aid in medical management of this problem. Addressed importance of increasing and maintaining water intake.

## 2023-11-25 NOTE — Assessment & Plan Note (Signed)
 Patient is seen by pulmonary, who manage this condition.  He is well controlled with current therapy.   Will defer to them for further changes to plan of care.

## 2023-11-25 NOTE — Assessment & Plan Note (Signed)
 Patient is seen by nephrology, who manage this condition.  He is well controlled with current therapy.   Will defer to them for further changes to plan of care.

## 2023-11-25 NOTE — Assessment & Plan Note (Signed)
 Continue current therapy for lipid control. Will modify as needed based on labwork results.

## 2023-11-25 NOTE — Assessment & Plan Note (Signed)
 Continue current diabetes POC, as patient has been well controlled on current regimen.  Will adjust meds if needed based on labs.

## 2023-11-28 ENCOUNTER — Telehealth: Payer: Self-pay | Admitting: Family

## 2023-11-28 NOTE — Telephone Encounter (Signed)
 [name] called inquiring about his most recent lab results. Please advise.

## 2023-11-29 DIAGNOSIS — Z9981 Dependence on supplemental oxygen: Secondary | ICD-10-CM | POA: Diagnosis not present

## 2023-11-29 DIAGNOSIS — J449 Chronic obstructive pulmonary disease, unspecified: Secondary | ICD-10-CM | POA: Diagnosis not present

## 2023-11-29 DIAGNOSIS — G4733 Obstructive sleep apnea (adult) (pediatric): Secondary | ICD-10-CM | POA: Diagnosis not present

## 2023-11-29 NOTE — Telephone Encounter (Signed)
 Printed for Alan to review

## 2023-11-29 NOTE — Progress Notes (Signed)
 Follow-up, COPD, and Shortness of Breath (Worsening. Possible Pleural Effusion. )   History of Present Illness: [name] [name] is a 78 y.o. male who presents to clinic for recheck. C/o dyspnea more so. No peripheral edema or calf pain. No ectopy or chest pain, he is a little over weight. Reviewed last cxr with him. He is on dialysis.   Narrative & Impression  EXAM: X-ray chest PA and lateral   INDICATION: sob, on dialysis Shortness of breath [R06.02 (ICD-10-CM)]   COMPARISON: None   FINDINGS: No acute osseus abnormality. Trace bilateral pleural effusions. Moderate pulmonary edema. Cardiomegaly. Right IJ tunneled dialysis catheter noted.   IMPRESSION: Cardiomegaly and mild pulmonary edema.   BRENDAN CHRISTOPHER CLINE, MD   He wheezes at times. No chest tightness. No pleurisy. .   Current Medications:  Current Outpatient Medications  Medication Sig Dispense Refill  . acetaminophen  (TYLENOL ) 650 MG ER tablet Take 1,300 mg by mouth as needed for Pain    . alcohol swabs (DROPSAFE ALCOHOL PREP PADS) PadM Apply topically    . amoxicillin  (AMOXIL ) 500 MG capsule Take 4 capsules 1 hour prior to dental work 8 capsule 2  . ascorbic acid , vitamin C , (VITAMIN C ) 1000 MG tablet Take 1,000 mg by mouth nightly.    . aspirin  81 MG chewable tablet Take 81 mg by mouth nightly.     . benzonatate (TESSALON) 100 MG capsule TAKE 1 CAPSULE BY MOUTH EVERY 8 HOURS AS NEEDED FOR COUGH    . busPIRone  (BUSPAR ) 5 MG tablet Take 5 mg by mouth 2 (two) times daily as needed    . calcitRIOL  (ROCALTROL ) 0.25 MCG capsule Take 0.25 mcg by mouth once daily    . citalopram  (CELEXA ) 40 MG tablet     . colchicine  (COLCRYS ) 0.6 mg tablet Take 1 tablet (0.6 mg total) by mouth every Monday, Wednesday, and Friday As needed for gout flare 12 tablet 5  . evolocumab (REPATHA SYRINGE) 140 mg/mL Syrg INJECT UNDER THE SKIN Q 2 WEEKS UTD    . ezetimibe  (ZETIA ) 10 mg tablet     . Febuxostat  (ULORIC ) 40 mg tablet Take 1  tablet (40 mg total) by mouth once daily 90 tablet 1  . FEXOFENADINE HCL (ALLEGRA ORAL) Take 10 mg by mouth once daily as needed.      . fluticasone  (FLONASE ) 50 mcg/actuation nasal spray Place 2 sprays into both nostrils as needed.     . fluticasone -umeclidinium-vilanterol (TRELEGY ELLIPTA ) 100-62.5-25 mcg inhaler Inhale 1 Puff into the lungs once daily 60 each 11  . HYDROcodone -acetaminophen  (NORCO) 5-325 mg tablet Take 1 tablet by mouth    . ipratropium-albuteroL  (DUO-NEB) nebulizer solution USE 1 VIAL VIA NEBULIZER EVERY 4 HOURS AS NEEDED    . losartan  (COZAAR ) 50 MG tablet Take 50 mg by mouth once daily 25 MG 1/2 TAB    . metoprolol  SUCCinate (TOPROL -XL) 50 MG XL tablet Take 50 mg by mouth once daily    . omeprazole  (PRILOSEC) 40 MG DR capsule Take 40 mg by mouth once daily    . OXYGEN -AIR DELIVERY SYSTEMS MISC Inhale 3 L into the lungs    . simethicone  (GAS-X ORAL) Take 1 tablet by mouth 2 (two) times daily as needed    . sucralfate  (CARAFATE ) 1 gram tablet     . TORsemide  (DEMADEX ) 20 MG tablet Take 40 mg by mouth once daily    . triamcinolone  0.5 % cream Apply topically 2 (two) times daily 15 g 4  . TRUE METRIX GLUCOSE TEST  STRIP test strip     . VENTOLIN  HFA 90 mcg/actuation inhaler Inhale 1 inhalation into the lungs as needed.    5  . vit A/vit C/vit E/zinc/copper (PRESERVISION AREDS ORAL) Take by mouth    . bempedoic acid-ezetimibe  180-10 mg Tab Take 1 tablet by mouth once daily (Patient not taking: Reported on 11/29/2023)    . gabapentin  (NEURONTIN ) 100 MG capsule Take 200 mg by mouth every morning (Patient not taking: Reported on 11/29/2023)    . pantoprazole  (PROTONIX ) 40 MG DR tablet Take 40 mg by mouth once daily. (Patient not taking: Reported on 11/29/2023)    . spironolactone  (ALDACTONE ) 25 MG tablet Take 25 mg by mouth once daily (Patient not taking: Reported on 11/29/2023)    . tamsulosin  (FLOMAX ) 0.4 mg capsule Take 0.4 mg by mouth once daily Take 30 minutes after same meal each  day. (Patient not taking: Reported on 11/29/2023)     No current facility-administered medications for this visit.    Problem List:  Patient Active Problem List  Diagnosis  . Personal history of other malignant neoplasm of skin  . Coronary artery disease  . Hyperlipidemia  . Hypertension  . Diabetes mellitus, type II (CMS/HHS-HCC)  . COPD (chronic obstructive pulmonary disease) (CMS/HHS-HCC)  . OSA (obstructive sleep apnea)  . Recurrent nephrolithiasis  . Accidental cut, puncture, perforation, or hemorrhage during heart catheterization  . Arthralgia of multiple sites  . Chronic kidney disease (CKD), stage IV (severe) (CMS/HHS-HCC)  . ANA positive  . Complete tear of right rotator cuff  . Rotator cuff arthropathy, right  . Rotator cuff tendinitis, right  . Hypoglycemia  . Idiopathic hypotension  . S/P TAVR (transcatheter aortic valve replacement)  . Acute postoperative pain  . On home oxygen  therapy  . Complex tear of medial meniscus of left knee as current injury  . Primary osteoarthritis of left knee  . Complex tear of lateral meniscus of left knee as current injury  . Plica syndrome of left knee    History: Past Medical History:  Diagnosis Date  . CAD (coronary artery disease)   . COPD (chronic obstructive pulmonary disease) (CMS/HHS-HCC)   . DM2 (diabetes mellitus, type 2) (CMS/HHS-HCC)   . History of polycythemia   . HLD (hyperlipidemia)   . HTN (hypertension)   . Nephrolithiasis   . OSA (obstructive sleep apnea)     Past Surgical History:  Procedure Laterality Date  . TRANSESOPHAGEAL ECHOCARDIOGRAPHY N/A 12/26/2012   Procedure: TRANSESOPHAGEAL ECHOCARDIOGRAPHY;  Surgeon: Lang Lannie Salter, MD;  Location: [facility];  Service: Cardiothoracic;  Laterality: N/A;  . PERCUTANEOUS REMOVAL INTRA-AORTIC BALLOON CATH Right 12/26/2012   Procedure: PERCUTANEOUS REMOVAL INTRA-AORTIC BALLOON CATH;  Surgeon: Lang Lannie Salter, MD;  Location: [facility];  Service: Cardiothoracic;  Laterality: Right;  Removal of Intramyocardial Catheter  . LAPAROSCOPIC REPAIR UMBILICAL HERNIA N/A 02/13/2014   Procedure: LAPAROSCOPIC UMBILICAL HERNIA REPAIR;  Surgeon: Shanda ONEIDA Salter, MD;  Location: [facility];  Service: General Surgery;  Laterality: N/A;  . REPLACEMENT AORTIC VALVE  08/01/2016  . Arthroscopic debridement with debridement of a symptomatic suprapatellar plica and partial medial and lateral meniscectomies, left knee Left 12/02/2020   Dr.Poggi  . Colon @ [facility]  04/28/2023   Tubular adenomas/Hyperplastic polyps/Repeat 84yr/TKT  . LITHOTRIPSY      Family History  Adopted: Yes  Problem Relation Name Age of Onset  . No Known Problems Mother    . No Known Problems Father      Social  History   Socioeconomic History  . Marital status: Married  Tobacco Use  . Smoking status: Former    Current packs/day: 0.00    Average packs/day: 1 pack/day for 40.0 years (40.0 ttl pk-yrs)    Types: Cigarettes    Start date: 02/10/1959    Quit date: 02/10/1999    Years since quitting: 24.8  . Smokeless tobacco: Former  Advertising account planner  . Vaping status: Never Used  Substance and Sexual Activity  . Alcohol use: No    Comment: occasional social EtOH use  . Drug use: No  . Sexual activity: Defer   Social Drivers of Health   Financial Resource Strain: Low Risk  (08/27/2023)   Received from [facility]   Overall Financial Resource Strain (CARDIA)   . Difficulty of Paying Living Expenses: Not very hard  Recent Concern: Financial Resource Strain - High Risk (07/24/2023)   Overall Financial Resource Strain (CARDIA)   . Difficulty of Paying Living Expenses: Hard  Food Insecurity: No Food Insecurity (08/28/2023)   Received from [facility]   Hunger Vital Sign   . Within the past 12 months, you worried that your food would run out before you got the money to buy more.: Never true   . Within the past 12 months, the food you bought just didn't last and you  didn't have money to get more.: Never true  Recent Concern: Food Insecurity - Food Insecurity Present (07/12/2023)   Hunger Vital Sign   . Worried About Programme researcher, broadcasting/film/video in the Last Year: Sometimes true   . Ran Out of Food in the Last Year: Sometimes true  Transportation Needs: No Transportation Needs (08/28/2023)   Received from [facility] - Transportation   . Lack of Transportation (Medical): No   . Lack of Transportation (Non-Medical): No    Allergies:  Nsaids (non-steroidal anti-inflammatory drug), Statins-hmg-coa reductase inhibitors, Bempedoic acid, Roflumilast , Tegaderm ag mesh [silver], and Wound dressings  Review of Systems: As per above. Pretty much unchanged with the exception that his breathing is improved. No associated cardiopulmonary, GI, GU, dermatological symptoms today. No focal neurological symptoms or psychological changes. No fever or chills.  Physical Exam: BP 131/62   Pulse 71   Ht 162.6 cm (5' 4)   Wt 85.5 kg (188 lb 6.4 oz)   SpO2 93% Comment: 3LPM CONT. TANK  BMI 32.34 kg/m  85.5 kg (188 lb 6.4 oz) 93% General:  NAD. Able to speak in complete sentences without cough or dyspnea, short over weight HEENT: Normocephalic, nontraumatic. Extraocular movements intact NECK: Supple. No JVD, nodes, thyromegaly CV: RRR no murmurs, gallops, rubs PULM: Normal respiratory effort, Clear to auscultation bilaterally without wheezing or crackles ABD: Increase girth EXTREMITIES: No significant edema, cyanosis or Homans'signs SKIN: Fair turgor. No rashes LYMPHATIC: No nodes NEURO: No gross deficits, asterixis PSYCH: Appropriate affect, alert, oriented   Diagnostics: IMPRESSIONS 04/24/23  1. Left ventricular ejection fraction, by estimation, is 50%. The left ventricle has low normal function. The left ventricle has no regional wall motion abnormalities. There is mild left ventricular hypertrophy. Left ventricular diastolic parameters are   indeterminate.    2. Right ventricular systolic function is low normal. The right ventricular size is normal. There is mildly elevated pulmonary artery systolic pressure.   3. Left atrial size was severely dilated.   4. The mitral valve is degenerative. Mild to moderate mitral valve regurgitation. Moderate mitral stenosis. The mean mitral valve gradient is 7.5 mmHg. Moderate mitral annular  calcification.   5. The aortic valve has been repaired/replaced. Aortic valve regurgitation is not visualized. Aortic valve mean gradient measures 12.0 mmHg.   6. Aortic dilatation noted. There is mild dilatation of the aortic root, measuring 41 mm.    EXAM:  PORTABLE CHEST 1 VIEW   COMPARISON:  09/06/2022   FINDINGS:  Cardiac shadow is prominent. 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 Aortic  calcifications are noted. Lungs are well aerated bilaterally. Mild  central vascular prominence is again seen without edema. No focal  infiltrate is noted. No bony abnormality is seen.   IMPRESSION:  No acute abnormality noted.    Electronically Signed    By: Oneil Devonshire M.D.    On: 04/22/2023 00:21    BUN 7 - 25 mg/dL 63 High     Creatinine 0.70 - 1.28 mg/dL 4.92 High     eGFR CKD-EPI CR 2021 > OR = 60 mL/min/1.84m2 11 Low     BUN/Creatinine Ratio 6 - 22 (calc) 12    Sodium 135 - 146 mmol/L 141    Potassium 3.5 - 5.3 mmol/L 5    Chloride 98 - 110 mmol/L 108    Bicarbonate (CO2) 20 - 32 mmol/L 23    Calcium 8.6 - 10.3 mg/dL 9    Phosphorus 2.1 - 4.3 mg/dL 7.4 High     Albumin 3.6 - 5.1 g/dL 3.6      Hemoglobin 86.7 - 17.1 g/dL 89.0 Low     Hematocrit 38.5 - 50.0 % 32.9 Low       SPIROMETRY: FVC was 3.18 liters, 133% of predicted FEV1 was 2.11, 114% of predicted FEV1 ratio was 66.39 FEF 25-75% liters per second was 57% of predicted   LUNG VOLUMES: TLC was 105% of predicted RV was 122% of predicted   DIFFUSION CAPACITY: DLCO was 45% of predicted DLCO/VA was 45% of predicted   FLOW VOLUME LOOP: A little  delayed   Impression Spirometry c/w moderate obstruction Lung volumes c/w hyperinflation DLCO/ VA/DLCO is severely decreased, seem out of portion. Her anemia may be contributing as well. ? 2/2 pulm htn.    Interpreting Physician Theotis Lavelle BRAVO, MD Fvc 2.40 liters ( 79 %), fev1 1.41 liters ( 69 %), fef 50 %), exp flow volume loop is delayed, spiro is c/w moderate obstruction.    FINDINGS: 8/23 The forced vital capacity is mildly decreased FEV1 is 1.72 L  which is 65% of predicted and is mildly decreased.  The FEV1 FVC  ratio is mildly decreased.  Postbronchodilator no significant  change in the FEV1 clinical improvement may still occur in the  absence of spirometric improvement.  Total lung capacity is  mildly decreased.  Residual volume is mildly decreased.  Residual  volume total lung capacity ratio is increased.  FRC is decreased.   DLCO was severely decreased.   IMPRESSION:  This pulmonary function study is consistent with mild restrictive  lung disease mild obstructive lung disease seen patient   Elfreda DELENA Bathe, MD [facility]  FINDINGS:  Similar cardiomegaly. Unchanged mediastinal contours. Aortic  atherosclerosis and tortuosity. TAVR. Lungs are hyperinflated with  mild diffuse interstitial coarsening. Probable left basilar  scarring. No confluent consolidation. No significant pleural  effusion. No pneumothorax. Calcified granuloma in the left lung. No  suspicious pulmonary nodule or mass by radiograph no acute osseous  findings.   IMPRESSION: cxr 1. Stable cardiomegaly and aortic atherosclerosis.  2. Mild hyperinflation. There is diffuse interstitial coarsening,  similar to prior exam. This is  favored to be related to smoking  related lung disease rather than pulmonary edema.   Electronically Signed  By: Andrea Gasman M.D.   On: 02/05/2022 16:16    Impression: Stage I - II copd, ex smoker, hx of past exposure to noxious inhalants. Over weight, On oxygen . . On   dialysis. ( Rena/ falure) Minimum rhinitis. Chronic dyspnea a little worse at times. . . Weight loss Continue albuterol  2 puffs q 6 hrs prn and trelegy one puff q day (rinse mouth) Portable 02 at 2-, 3 liters Trial ohtulvayre nebs and see Dialysis ( will help with fluid balance, Dr jaclynn ) Watch salt Pace self F/u in in 18 weeks   Osa on cpap/02 ( 2 liters), rested. Machine functioning well. Less than 2 years Weight loss Cpap on same settings Encourage to wear it nightly

## 2023-12-03 DIAGNOSIS — H353221 Exudative age-related macular degeneration, left eye, with active choroidal neovascularization: Secondary | ICD-10-CM | POA: Diagnosis not present

## 2023-12-04 ENCOUNTER — Ambulatory Visit
Admission: RE | Admit: 2023-12-04 | Discharge: 2023-12-04 | Disposition: A | Discharge status: Home / Self Care | Source: Ambulatory Visit | Attending: Cardiology | Admitting: Cardiology

## 2023-12-04 ENCOUNTER — Other Ambulatory Visit: Payer: Self-pay

## 2023-12-04 ENCOUNTER — Ambulatory Visit
Admission: RE | Admit: 2023-12-04 | Discharge: 2023-12-04 | Disposition: A | Discharge status: Home / Self Care | Attending: Cardiology | Admitting: Cardiology

## 2023-12-04 ENCOUNTER — Encounter: Payer: Self-pay | Admitting: Cardiology

## 2023-12-04 ENCOUNTER — Ambulatory Visit: Admitting: Cardiology

## 2023-12-04 VITALS — BP 146/52 | HR 85 | Ht 65.0 in | Wt 182.0 lb

## 2023-12-04 DIAGNOSIS — R0602 Shortness of breath: Secondary | ICD-10-CM | POA: Insufficient documentation

## 2023-12-04 DIAGNOSIS — I10 Essential (primary) hypertension: Secondary | ICD-10-CM

## 2023-12-04 DIAGNOSIS — I5020 Unspecified systolic (congestive) heart failure: Secondary | ICD-10-CM

## 2023-12-04 DIAGNOSIS — I517 Cardiomegaly: Secondary | ICD-10-CM | POA: Diagnosis not present

## 2023-12-04 DIAGNOSIS — I25118 Atherosclerotic heart disease of native coronary artery with other forms of angina pectoris: Secondary | ICD-10-CM

## 2023-12-04 DIAGNOSIS — R0989 Other specified symptoms and signs involving the circulatory and respiratory systems: Secondary | ICD-10-CM | POA: Diagnosis not present

## 2023-12-04 DIAGNOSIS — R918 Other nonspecific abnormal finding of lung field: Secondary | ICD-10-CM | POA: Diagnosis not present

## 2023-12-04 DIAGNOSIS — J449 Chronic obstructive pulmonary disease, unspecified: Secondary | ICD-10-CM | POA: Diagnosis not present

## 2023-12-04 NOTE — Assessment & Plan Note (Signed)
 Patient complaining of shortness of breath. Denies lower extremity edema. Patient started dialysis March 2025. States he goes twice weekly, they do not take much fluid off due to cramping. Patient on torsemide  20 mg daily and Metolazone 5 mg weekly. Echo 09/2023, EF 53%, grade II DD. Mild MVR, mod. AVR. TAVR 2018. Will get a chest xray today.

## 2023-12-04 NOTE — Assessment & Plan Note (Signed)
 Stress test 08/2022 Ischemia in the RCA territory with borderline LVEF dysfunction, advise CCTA. CCTA not done due to patient's kidney function at that time, treated medically. Patient now on dialysis. Will discuss CCTA vs. heart cath at office visit in one week.

## 2023-12-04 NOTE — Progress Notes (Signed)
 Cardiology Office Note   Date:  12/04/2023   ID:  Kamden, Stanislaw 1945-10-14, MRN [id number]  PCP:  Orlean Alan HERO, FNP  Cardiologist:  Jeoffrey Pollen, NP      History of Present Illness: [name] [name] is a 78 y.o. male who presents for  Chief Complaint  Patient presents with   Acute Visit    Shortness Of Breath    Patient in office for an acute visit, patient complaining of shortness of breath.       Past Medical History:  Diagnosis Date   Accidental cut, puncture, perforation, or hemorrhage during heart catheterization 12/27/2012   Acute kidney injury superimposed on stage 4 chronic kidney disease (HCC) 07/27/2020   Acute on chronic combined systolic and diastolic CHF (congestive heart failure) (HCC) 04/25/2023   Acute on chronic respiratory failure with hypoxia (HCC) 04/22/2023   Adopted    AKI (acute kidney injury) (HCC) 02/05/2023   ANA positive 07/12/2015   Aortic atherosclerosis (HCC)    Aortic stenosis, severe    a.) s/p TAVR on 08/01/2016: 29 mm CoreValve Evolut placed   Arthritis    Bacteremia due to Streptococcus 04/24/2023   Basal cell carcinoma 01/04/2010   Right sup. post. helix. Excised 03/02/2010   BCC (basal cell carcinoma of skin) 11/24/2020   R neck below the ear, EDC   BPH (benign prostatic hyperplasia)    CAD (coronary artery disease) 12/26/2012   a.) LHC 12/26/2012: 50% mLAD, 80% mLCx, 100% pRCA --> CVTS at [facility] --> decided again revascularization due to insig LAD disease and the fact that RCA lesion was chronic   CAP (community acquired pneumonia) 07/26/2020   Chronic respiratory failure with hypoxia (HCC)    Claudication of both lower extremities (HCC)    Complete tear of right rotator cuff 11/15/2015   Complex tear of lateral meniscus of left knee as current injury 12/06/2020   Complex tear of medial meniscus of left knee as current injury 10/18/2020   COPD (chronic obstructive pulmonary disease) (HCC)     Diarrhea 02/04/2023   Diet-controlled type 2 diabetes mellitus (HCC)    Diverticulosis    Dyspnea    Electrolyte abnormality 02/04/2023   ESRD (end stage renal disease) (HCC)    GERD (gastroesophageal reflux disease)    Gout    Heart murmur    HFrEF (heart failure with reduced ejection fraction) (HCC)    a.) s/p TAVR on 08/01/2016: 29 mm CoreValve Evolut placed   History of bilateral cataract extraction    History of hiatal hernia    HLD (hyperlipidemia)    Hyperkalemia 07/30/2020   Hypertension    Hypomagnesemia 02/05/2023   Hypothyroidism    IDA (iron deficiency anemia)    Migraines    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   a.) TTE 04/24/2023: moderate (MPG 7.5 mmHG)   OSA on CPAP    Pneumonia 07/2020   Pulmonary hypertension (HCC)    a.) TTE 08/17/2022: mild pHTN per echo report; b.) TTE 04/24/2023: RVSP 39.1   Recurrent nephrolithiasis 12/27/2012   Rotator cuff tendinitis, right 11/15/2015   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 MOHS.   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/[place])      Past Surgical History:  Procedure Laterality Date   AORTIC VALVE REPLACEMENT N/A 08/01/2016   29 mm CoreValve Evolut; Location: [facility]; Surgeon: Reyes Fruits, MD   AV FISTULA PLACEMENT Right 06/27/2023   Procedure: ARTERIOVENOUS (AV) FISTULA CREATION (BRACHIALCEPHALIC);  Surgeon: Jama Cordella MATSU, MD;  Location: [facility];  Service: Vascular;  Laterality: Right;   CARDIAC CATHETERIZATION N/A 12/26/2012   2v CAD; retained  pigtail in myocardium --> transferred to [facility]; Location: [facility]; Surgeon: Vita Bathe, MD   CATARACT EXTRACTION W/PHACO Left 10/03/2022   Procedure: CATARACT EXTRACTION PHACO AND INTRAOCULAR LENS PLACEMENT (IOC) LEFT DIABETIC 8.22 00:55.5;  Surgeon: Jaye Fallow, MD;  Location: [facility];  Service: Ophthalmology;  Laterality: Left;  sleep apnea   CATARACT EXTRACTION W/PHACO Right 10/17/2022   Procedure: CATARACT EXTRACTION PHACO AND INTRAOCULAR LENS PLACEMENT (IOC) RIGHT DIABETIC;  Surgeon: Jaye Fallow, MD;  Location: [facility];  Service: Ophthalmology;  Laterality: Right;  4.79 0:37.4   COLONOSCOPY     COLONOSCOPY N/A 04/28/2023   Procedure: COLONOSCOPY;  Surgeon: Toledo, Ladell POUR, MD;  Location: [facility];  Service: Gastroenterology;  Laterality: N/A;   DIALYSIS/PERMA CATHETER INSERTION N/A 08/29/2023   Procedure: DIALYSIS/PERMA CATHETER INSERTION;  Surgeon: Jama Cordella MATSU, MD;  Location: [facility];  Service: Cardiovascular;  Laterality: N/A;   DIALYSIS/PERMA CATHETER REMOVAL N/A 11/12/2023   Procedure: DIALYSIS/PERMA CATHETER REMOVAL;  Surgeon: Marea Selinda RAMAN, MD;  Location: [facility];  Service: Cardiovascular;  Laterality: N/A;   KNEE ARTHROSCOPY WITH MEDIAL MENISECTOMY Left 12/02/2020   Procedure: KNEE ARTHROSCOPY WITH DEBRIDEMENT AND PARTIAL MEDIAL AND LATERAL MENISECTOMY;  Surgeon: Edie Norleen PARAS, MD;  Location: [facility];  Service: Orthopedics;  Laterality: Left;   LITHOTRIPSY     PERCUTANEOUS REMOVAL INTRA-AORTIC BALLOON CATH N/A 12/26/2012   Procedure: PERCUTANEOUS REMOVAL INTRA-AORTIC BALLOON CATH; Surgeon: Lang Lannie Salter, MD; Location: [facility]; Service: Cardiothoracic   POLYPECTOMY  04/28/2023   Procedure: POLYPECTOMY;  Surgeon: Aundria, Ladell POUR, MD;  Location: [facility];  Service: Gastroenterology;;   RIGHT HEART CATH Right 07/13/2016   Location: [facility]; Surgeon: Ozell Estelle, MD   TEE WITHOUT CARDIOVERSION  Right 04/26/2023   Procedure: TRANSESOPHAGEAL ECHOCARDIOGRAM (TEE);  Surgeon: Bathe Denyse LABOR, MD;  Location: [facility];  Service: Cardiovascular;  Laterality: Right;   TRANSESOPHAGEAL ECHOCARDIOGRAM N/A 12/26/2012   Procedure: TRANSESOPHAGEAL ECHOCARDIOGRAPHY; Surgeon: Lang Lannie Salter, MD; Location: [facility]; Service: Cardiothoracic   UMBILICAL HERNIA REPAIR N/A 02/13/2014   Procedure: LAPAROSCOPIC UMBILICAL HERNIA REPAIR; Surgeon: Shanda ONEIDA Salter, MD; Location: [facility]; Service: General Surgery     Current Outpatient Medications  Medication Sig Dispense Refill   acetaminophen  (TYLENOL ) 650 MG CR tablet Take 1,300 mg by mouth every 8 (eight) hours as needed for pain.     albuterol  (VENTOLIN  HFA) 108 (90 Base) MCG/ACT inhaler Inhale 2 puffs into the lungs every 6 (six) hours as needed for wheezing or shortness of breath.     Alcohol Swabs (DROPSAFE ALCOHOL PREP) 70 % PADS Apply 1 each topically as needed (With repatha and blood sugar checks.).     Ascorbic Acid  (VITAMIN C ) 1000 MG tablet Take 1,000 mg by mouth at bedtime.     aspirin  EC 81 MG tablet Take 81 mg by mouth daily. Swallow whole.  busPIRone  (BUSPAR ) 5 MG tablet Take 1 tablet (5 mg total) by mouth at bedtime. 90 tablet 1   calcitRIOL  (ROCALTROL ) 0.25 MCG capsule Take 0.25 mcg by mouth daily.     citalopram  (CELEXA ) 40 MG tablet Take 1 tablet (40 mg total) by mouth daily. 90 tablet 3   Evolocumab 140 MG/ML SOAJ Inject 140 mg into the skin every 14 (fourteen) days.     ezetimibe  (ZETIA ) 10 MG tablet TAKE 1 TABLET BY MOUTH AT BEDTIME 90 tablet 1   febuxostat  (ULORIC ) 40 MG tablet Take 40 mg by mouth at bedtime.     fexofenadine (ALLEGRA) 180 MG tablet Take 180 mg by mouth daily as needed for allergies or rhinitis.     Fluticasone -Umeclidin-Vilant (TRELEGY ELLIPTA ) 100-62.5-25 MCG/ACT AEPB Inhale 1 Inhalation into the lungs daily at 6 (six) AM. (Patient taking differently: Inhale 1 Inhalation into the lungs daily  as needed (shortness of breath).) 60 each 11   metolazone (ZAROXOLYN) 5 MG tablet Take 5 mg by mouth once a week.     metoprolol  succinate (TOPROL -XL) 50 MG 24 hr tablet TAKE 1 TABLET EVERY DAY 90 tablet 3   Multiple Vitamins-Minerals (PRESERVISION AREDS) CAPS Take 1 capsule by mouth 2 (two) times daily.     omeprazole  (PRILOSEC) 40 MG capsule Take 1 capsule (40 mg total) by mouth daily. 30 capsule 3   OXYGEN  Inhale 2-3 L into the lungs See admin instructions. Used as needed throughout the day and continuous at bedtime     SUPER B COMPLEX/C PO Take 1 tablet by mouth daily.     torsemide  (DEMADEX ) 20 MG tablet Take 20 mg by mouth daily.     No current facility-administered medications for this visit.    Allergies:   Nsaids, Statins, Nexletol [bempedoic acid], Pedi-pre tape spray [wound dressing adhesive], Silver, and Tegaderm high gelling alginate [wound dressings]    Social History:   reports that he quit smoking about 15 years ago. His smoking use included cigarettes. He started smoking about 69 years ago. He has a 108 pack-year smoking history. He quit smokeless tobacco use about 19 years ago.  His smokeless tobacco use included chew. He reports current alcohol use. He reports that he does not use drugs.   Family History:  family history includes Arthritis in his son; Asthma in his daughter; COPD in his daughter; Depression in his daughter; Diabetes in his daughter and son; Hyperlipidemia in his daughter; Hypertension in his son; Obesity in his daughter; Varicose Veins in his daughter. He was adopted.    ROS:     Review of Systems  Constitutional: Negative.   HENT: Negative.    Eyes: Negative.   Respiratory:  Positive for shortness of breath.   Cardiovascular: Negative.   Gastrointestinal: Negative.   Genitourinary: Negative.   Musculoskeletal: Negative.   Skin: Negative.   Neurological: Negative.   Endo/Heme/Allergies: Negative.   Psychiatric/Behavioral: Negative.    All other  systems reviewed and are negative.     All other systems are reviewed and negative.    PHYSICAL EXAM: VS:  BP (!) 146/52   Pulse 85   Ht 5' 5 (1.651 m)   Wt 182 lb (82.6 kg)   SpO2 90%   PF (!) 3 L/min   BMI 30.29 kg/m  , BMI Body mass index is 30.29 kg/m. Last weight:  Wt Readings from Last 3 Encounters:  12/04/23 182 lb (82.6 kg)  11/22/23 189 lb 3.2 oz (85.8 kg)  10/09/23 195 lb  9.6 oz (88.7 kg)     Physical Exam Vitals and nursing note reviewed.  Constitutional:      Appearance: Normal appearance. He is normal weight.  HENT:     Head: Normocephalic and atraumatic.     Nose: Nose normal.     Mouth/Throat:     Mouth: Mucous membranes are moist.     Pharynx: Oropharynx is clear.  Eyes:     Extraocular Movements: Extraocular movements intact.     Conjunctiva/sclera: Conjunctivae normal.     Pupils: Pupils are equal, round, and reactive to light.  Cardiovascular:     Rate and Rhythm: Normal rate and regular rhythm.     Pulses: Normal pulses.     Heart sounds: Normal heart sounds.  Pulmonary:     Effort: Pulmonary effort is normal.     Breath sounds: Normal breath sounds.  Abdominal:     General: Abdomen is flat. Bowel sounds are normal.     Palpations: Abdomen is soft.  Musculoskeletal:        General: Normal range of motion.     Cervical back: Normal range of motion.  Skin:    General: Skin is warm and dry.  Neurological:     General: No focal deficit present.     Mental Status: He is alert and oriented to person, place, and time. Mental status is at baseline.  Psychiatric:        Mood and Affect: Mood normal.        Behavior: Behavior normal.        Thought Content: Thought content normal.        Judgment: Judgment normal.     EKG: none today  Recent Labs: 04/21/2023: B Natriuretic Peptide 820.9 09/10/2023: Magnesium  1.7 09/20/2023: Hemoglobin 10.7; Platelets 139; TSH 4.050 11/22/2023: ALT 8; BUN 30; Creatinine, Ser 4.22; Potassium 4.4; Sodium 138     Lipid Panel    Component Value Date/Time   CHOL 80 (L) 09/20/2023 1003   CHOL 100 07/26/2012 0111   TRIG 103 09/20/2023 1003   TRIG 157 07/26/2012 0111   HDL 40 09/20/2023 1003   HDL 35 (L) 07/26/2012 0111   CHOLHDL 2.0 09/20/2023 1003   VLDL 31 07/26/2012 0111   LDLCALC 20 09/20/2023 1003   LDLCALC 34 07/26/2012 0111      Other studies Reviewed: stress test 08/2022, echo 09/2023   ASSESSMENT AND PLAN:    ICD-10-CM   1. Coronary artery disease involving native coronary artery of native heart with other form of angina pectoris (HCC)  I25.118     2. HFrEF (heart failure with reduced ejection fraction) (HCC)  I50.20 DG Chest 2 View    3. SOB (shortness of breath)  R06.02 DG Chest 2 View       Problem List Items Addressed This Visit       Cardiovascular and Mediastinum   Coronary artery disease - Primary   Stress test 08/2022 Ischemia in the RCA territory with borderline LVEF dysfunction, advise CCTA. CCTA not done due to patient's kidney function at that time, treated medically. Patient now on dialysis. Will discuss CCTA vs. heart cath at office visit in one week.       Relevant Medications   metolazone (ZAROXOLYN) 5 MG tablet   HFrEF (heart failure with reduced ejection fraction) (HCC)   Patient complaining of shortness of breath. Denies lower extremity edema. Patient started dialysis March 2025. States he goes twice weekly, they do not take much fluid off due  to cramping. Patient on torsemide  20 mg daily and Metolazone 5 mg weekly. Echo 09/2023, EF 53%, grade II DD. Mild MVR, mod. AVR. TAVR 2018. Will get a chest xray today.       Relevant Medications   metolazone (ZAROXOLYN) 5 MG tablet   Other Relevant Orders   DG Chest 2 View (Completed)     Other   SOB (shortness of breath)   Relevant Orders   DG Chest 2 View (Completed)     Disposition:   Return if symptoms worsen or fail to improve, for as scheduled next week.    Total time spent: 25  minutes  Signed,  Jeoffrey Pollen, NP  12/04/2023 12:53 PM    [facility]

## 2023-12-04 NOTE — Progress Notes (Signed)
 12/04/2023 Name: [name] [name] MRN: [id number] DOB: [birth year]  Chief Complaint  Patient presents with   Medication Management    [name] [name] is a 78 y.o. year old male who presented for a telephone visit.   They were referred to the pharmacist by their PCP for assistance in managing complex medication management.    Subjective:  Care Team: Primary Care Provider: Orlean Alan HERO, FNP ; Next Scheduled Visit: 01/22/24  Medication Access/Adherence  Current Pharmacy:  [id number] - [place], [place] - [address] [place] [address] Phone: [phone] Fax: [phone]  [id number] [place],  - [address] AT [facility] [address] [facility] [address] [place] [address] Phone: [phone] Fax: [phone]  [facility] - [place], [place] - [address] [place] [address] Phone: [phone] Fax: [phone]  [facility] - [place], [place] - [address] [place] [address] Phone: [phone] Fax: [phone]   Patient reports affordability concerns with their medications: No  Patient reports access/transportation concerns to their pharmacy: No  Patient reports adherence concerns with their medications:  No     Med Mgmt -No medication changes since last phone call with pharmacist. -Having breathing concerns recently, saw pulm last week, no change to inhalers. -Saw provider today at PCP office to follow up on breathing concerns, waiting for x ray results, patient also waiting for a new OTC supplement to arrive that him and wife hope will replace need for any inhaler, cannot recall name   Objective:  Lab Results  Component Value Date   HGBA1C 5.0 09/20/2023    Lab Results  Component Value Date   CREATININE 4.22 (H) 11/22/2023   BUN 30 (H) 11/22/2023   NA 138 11/22/2023   K 4.4 11/22/2023   CL 97 11/22/2023   CO2 22  11/22/2023    Lab Results  Component Value Date   CHOL 80 (L) 09/20/2023   HDL 40 09/20/2023   LDLCALC 20 09/20/2023   TRIG 103 09/20/2023   CHOLHDL 2.0 09/20/2023    Medications Reviewed Today     Reviewed by Lionell Jon DEL, RPH (Pharmacist) on 12/04/23 at 1604  Med List Status: <None>   Medication Order Taking? Sig Documenting Provider Last Dose Status Informant  acetaminophen  (TYLENOL ) 650 MG CR tablet [id number] Yes Take 1,300 mg by mouth every 8 (eight) hours as needed for pain. [provider]  Active Spouse/Significant Other  albuterol  (VENTOLIN  HFA) 108 (90 Base) MCG/ACT inhaler [id number] Yes Inhale 2 puffs into the lungs every 6 (six) hours as needed for wheezing or shortness of breath. [provider]  Active Spouse/Significant Other  Alcohol Swabs (DROPSAFE ALCOHOL PREP) 70 % PADS [id number]  Apply 1 each topically as needed (With repatha and blood sugar checks.). [provider]  Active Spouse/Significant Other  Ascorbic Acid  (VITAMIN C ) 1000 MG tablet [id number] Yes Take 1,000 mg by mouth at bedtime. [provider]  Active Spouse/Significant Other  aspirin  EC 81 MG tablet [id number] Yes Take 81 mg by mouth daily. Swallow whole. [provider]  Active   busPIRone  (BUSPAR ) 5 MG tablet [id number] Yes Take 1 tablet (5 mg total) by mouth at bedtime. Orlean Alan HERO, FNP  Active   calcitRIOL  (ROCALTROL ) 0.25 MCG capsule [id number] Yes Take 0.25 mcg by mouth daily. [provider]  Active Spouse/Significant  Other  citalopram  (CELEXA ) 40 MG tablet [id number] Yes Take 1 tablet (40 mg total) by mouth daily. Orlean Alan HERO, FNP  Active   Evolocumab 140 MG/ML EMMANUEL [id number] Yes Inject 140 mg into the skin every 14 (fourteen) days. [provider]  Active   ezetimibe  (ZETIA ) 10 MG tablet [id number] Yes TAKE 1 TABLET BY MOUTH AT BEDTIME Orlean Alan HERO, FNP  Active   febuxostat  (ULORIC ) 40 MG tablet [id number] Yes Take 40 mg  by mouth at bedtime. [provider]  Active Spouse/Significant Other  fexofenadine (ALLEGRA) 180 MG tablet [id number] Yes Take 180 mg by mouth daily as needed for allergies or rhinitis. [provider]  Active Spouse/Significant Other  Fluticasone -Umeclidin-Vilant (TRELEGY ELLIPTA ) 100-62.5-25 MCG/ACT AEPB [id number]  Inhale 1 Inhalation into the lungs daily at 6 (six) AM.  Patient taking differently: Inhale 1 Inhalation into the lungs daily as needed (shortness of breath).   Orlean Alan HERO, FNP  Active Spouse/Significant Other  metolazone (ZAROXOLYN) 5 MG tablet [id number]  Take 5 mg by mouth once a week. [provider]  Active   metoprolol  succinate (TOPROL -XL) 50 MG 24 hr tablet [id number] Yes TAKE 1 TABLET EVERY DAY Orlean Alan HERO, FNP  Active Spouse/Significant Other  Multiple Vitamins-Minerals (PRESERVISION AREDS) CAPS [id number] Yes Take 1 capsule by mouth 2 (two) times daily. [provider]  Active Spouse/Significant Other  omeprazole  (PRILOSEC) 40 MG capsule [id number] Yes Take 1 capsule (40 mg total) by mouth daily. Orlean Alan HERO, FNP  Active   OXYGEN  [id number] Yes Inhale 2-3 L into the lungs See admin instructions. Used as needed throughout the day and continuous at bedtime [provider]  Active Spouse/Significant Other  SUPER B COMPLEX/C PO [id number] Yes Take 1 tablet by mouth daily. [provider]  Active Spouse/Significant Other  torsemide  (DEMADEX ) 20 MG tablet [id number] Yes Take 20 mg by mouth daily. [provider]  Active Spouse/Significant Other           Med Note JANEAN, JON DEL   Tue Oct 02, 2023  3:51 PM) 2 tablets daily              Assessment/Plan:   Med Mgmt -Will hold off on starting any inhaler changes with appropriate providers while we await cardio/pcp work up for other underlying causes. Could consider switching to breztri  in future. -Counseled do not start any OTC supplements without  consult, and to continue inhaler use until instructed otherwise by medical team.   Follow Up Plan: 2 weeks  JON DEL [place], PharmD Clinical Pharmacist [phone]

## 2023-12-05 ENCOUNTER — Telehealth: Payer: Self-pay | Admitting: Family

## 2023-12-05 NOTE — Telephone Encounter (Signed)
 [name] called requesting x-ray results. Amber is out of the office this afternoon. Advised patient's wife that we will call with the results tomorrow when Amber returns to the office.

## 2023-12-06 ENCOUNTER — Encounter: Payer: Self-pay | Admitting: Family

## 2023-12-06 ENCOUNTER — Ambulatory Visit: Payer: Self-pay | Admitting: Cardiology

## 2023-12-06 DIAGNOSIS — H353211 Exudative age-related macular degeneration, right eye, with active choroidal neovascularization: Secondary | ICD-10-CM | POA: Diagnosis not present

## 2023-12-06 NOTE — Progress Notes (Signed)
 Informed by payton and printed results for pts wife to take over to the Pulmonologist

## 2023-12-06 NOTE — Progress Notes (Signed)
 Established Patient Office Visit  Subjective:  Patient ID: [name] [name], male    DOB: 1945-12-19  Age: 78 y.o. MRN: [id number]  Chief Complaint  Patient presents with   Acute Visit    Fatigue, body aches for the past week.    Pt here with multiple concerns He has been particularly fatigued over the last several days, though his wife reports that he did not go to dialysis, because he thought that since there was no fluid being removed, it wasn't important for him to do this.  He has also been very irritable also according to his wife, and he does acknowledge that his mood has been poor as well.      No other concerns at this time.   Past Medical History:  Diagnosis Date   Accidental cut, puncture, perforation, or hemorrhage during heart catheterization 12/27/2012   Acute kidney injury superimposed on stage 4 chronic kidney disease (HCC) 07/27/2020   Acute on chronic combined systolic and diastolic CHF (congestive heart failure) (HCC) 04/25/2023   Acute on chronic respiratory failure with hypoxia (HCC) 04/22/2023   Adopted    AKI (acute kidney injury) (HCC) 02/05/2023   ANA positive 07/12/2015   Aortic atherosclerosis (HCC)    Aortic stenosis, severe    a.) s/p TAVR on 08/01/2016: 29 mm CoreValve Evolut placed   Arthritis    Bacteremia due to Streptococcus 04/24/2023   Basal cell carcinoma 01/04/2010   Right sup. post. helix. Excised 03/02/2010   BCC (basal cell carcinoma of skin) 11/24/2020   R neck below the ear, EDC   BPH (benign prostatic hyperplasia)    CAD (coronary artery disease) 12/26/2012   a.) LHC 12/26/2012: 50% mLAD, 80% mLCx, 100% pRCA --> CVTS at [facility] --> decided again revascularization due to insig LAD disease and the fact that RCA lesion was chronic   CAP (community acquired pneumonia) 07/26/2020   Chronic respiratory failure with hypoxia (HCC)    Claudication of both lower extremities (HCC)    Complete tear of right rotator cuff 11/15/2015    Complex tear of lateral meniscus of left knee as current injury 12/06/2020   Complex tear of medial meniscus of left knee as current injury 10/18/2020   COPD (chronic obstructive pulmonary disease) (HCC)    Diarrhea 02/04/2023   Diet-controlled type 2 diabetes mellitus (HCC)    Diverticulosis    Dyspnea    Electrolyte abnormality 02/04/2023   ESRD (end stage renal disease) (HCC)    GERD (gastroesophageal reflux disease)    Gout    Heart murmur    HFrEF (heart failure with reduced ejection fraction) (HCC)    a.) s/p TAVR on 08/01/2016: 29 mm CoreValve Evolut placed   History of bilateral cataract extraction    History of hiatal hernia    HLD (hyperlipidemia)    Hyperkalemia 07/30/2020   Hypertension    Hypomagnesemia 02/05/2023   Hypothyroidism    IDA (iron deficiency anemia)    Migraines    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   a.) TTE 04/24/2023: moderate (MPG 7.5 mmHG)   OSA on CPAP    Pneumonia 07/2020   Pulmonary hypertension (HCC)    a.) TTE 08/17/2022: mild pHTN per echo report; b.) TTE 04/24/2023: RVSP 39.1   Recurrent nephrolithiasis 12/27/2012   Rotator cuff tendinitis, right 11/15/2015   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 MOHS.   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/[place])     Past Surgical History:  Procedure Laterality Date   AORTIC VALVE REPLACEMENT N/A 08/01/2016   29 mm CoreValve Evolut; Location: [facility]; Surgeon: Reyes Fruits, MD   AV FISTULA PLACEMENT Right 06/27/2023   Procedure:  ARTERIOVENOUS (AV) FISTULA CREATION (BRACHIALCEPHALIC);  Surgeon: Jama Cordella MATSU, MD;  Location: [facility];  Service: Vascular;  Laterality: Right;   CARDIAC CATHETERIZATION N/A 12/26/2012   2v CAD; retained pigtail in myocardium --> transferred to [facility]; Location: [facility]; Surgeon: Vita Bathe, MD   CATARACT EXTRACTION W/PHACO Left 10/03/2022   Procedure: CATARACT EXTRACTION PHACO AND INTRAOCULAR LENS PLACEMENT (IOC) LEFT DIABETIC 8.22 00:55.5;  Surgeon: Jaye Fallow, MD;  Location: [facility];  Service: Ophthalmology;  Laterality: Left;  sleep apnea   CATARACT EXTRACTION W/PHACO Right 10/17/2022   Procedure: CATARACT EXTRACTION PHACO AND INTRAOCULAR LENS PLACEMENT (IOC) RIGHT DIABETIC;  Surgeon: Jaye Fallow, MD;  Location: [facility];  Service: Ophthalmology;  Laterality: Right;  4.79 0:37.4   COLONOSCOPY     COLONOSCOPY N/A 04/28/2023   Procedure: COLONOSCOPY;  Surgeon: Toledo, Ladell POUR, MD;  Location: [facility];  Service: Gastroenterology;  Laterality: N/A;   DIALYSIS/PERMA CATHETER INSERTION N/A 08/29/2023   Procedure: DIALYSIS/PERMA CATHETER INSERTION;  Surgeon: Jama Cordella MATSU, MD;  Location: [facility];  Service: Cardiovascular;  Laterality: N/A;   DIALYSIS/PERMA CATHETER REMOVAL N/A 11/12/2023   Procedure: DIALYSIS/PERMA CATHETER REMOVAL;  Surgeon: Marea Selinda RAMAN, MD;  Location: [facility];  Service: Cardiovascular;  Laterality: N/A;   KNEE ARTHROSCOPY WITH MEDIAL MENISECTOMY Left 12/02/2020   Procedure: KNEE ARTHROSCOPY WITH DEBRIDEMENT AND PARTIAL MEDIAL AND LATERAL MENISECTOMY;  Surgeon: Edie Norleen PARAS, MD;  Location: [facility];  Service: Orthopedics;  Laterality: Left;   LITHOTRIPSY     PERCUTANEOUS REMOVAL INTRA-AORTIC BALLOON CATH N/A 12/26/2012   Procedure: PERCUTANEOUS REMOVAL INTRA-AORTIC BALLOON CATH; Surgeon: Lang Lannie Salter, MD; Location: [facility]; Service: Cardiothoracic   POLYPECTOMY  04/28/2023   Procedure:  POLYPECTOMY;  Surgeon: Aundria, Ladell POUR, MD;  Location: [facility];  Service: Gastroenterology;;   RIGHT HEART CATH Right 07/13/2016   Location: [facility]; Surgeon: Ozell Estelle, MD   TEE WITHOUT CARDIOVERSION Right 04/26/2023   Procedure: TRANSESOPHAGEAL ECHOCARDIOGRAM (TEE);  Surgeon: Bathe Denyse LABOR, MD;  Location: [facility];  Service: Cardiovascular;  Laterality: Right;   TRANSESOPHAGEAL ECHOCARDIOGRAM N/A 12/26/2012   Procedure: TRANSESOPHAGEAL ECHOCARDIOGRAPHY; Surgeon: Lang Lannie Salter, MD; Location: [facility]; Service: Cardiothoracic   UMBILICAL HERNIA REPAIR N/A 02/13/2014   Procedure: LAPAROSCOPIC UMBILICAL HERNIA REPAIR; Surgeon: Shanda ONEIDA Salter, MD; Location: [facility]; Service: General Surgery    Social History   Socioeconomic History   Marital status: Married    Spouse name: Lani Mendiola   Number of children: 2   Years of education: Not on file   Highest education level: Not on file  Occupational History   Occupation: retired  Tobacco Use   Smoking status: Former    Current packs/day: 0.00    Average packs/day: 2.0 packs/day for 54.0 years (108.0 ttl pk-yrs)    Types: Cigarettes    Start date:  1956    Quit date: 2010    Years since quitting: 15.6   Smokeless tobacco: Former    Types: Chew    Quit date: 05/16/2004  Vaping Use   Vaping status: Never Used  Substance and Sexual Activity   Alcohol use: Yes    Comment: rare beer   Drug use: Never   Sexual activity: Not on file  Other Topics Concern   Not on file  Social History Narrative   Not on file   Social Drivers of Health   Financial Resource Strain: Low Risk  (08/27/2023)   Overall Financial Resource Strain (CARDIA)    Difficulty of Paying Living Expenses: Not very hard  Recent Concern: Financial Resource Strain - High Risk (07/24/2023)   Received from [facility]   Overall Financial Resource Strain (CARDIA)    Difficulty of Paying Living Expenses: Hard  Food  Insecurity: No Food Insecurity (08/28/2023)   Hunger Vital Sign    Worried About Running Out of Food in the Last Year: Never true    Ran Out of Food in the Last Year: Never true  Recent Concern: Food Insecurity - Food Insecurity Present (07/12/2023)   Received from [facility]   Hunger Vital Sign    Worried About Running Out of Food in the Last Year: Sometimes true    Ran Out of Food in the Last Year: Sometimes true  Transportation Needs: No Transportation Needs (08/28/2023)   PRAPARE - Administrator, Civil Service (Medical): No    Lack of Transportation (Non-Medical): No  Physical Activity: Inactive (07/18/2023)   Received from [facility]   Exercise Vital Sign    On average, how many days per week do you engage in moderate to strenuous exercise (like a brisk walk)?: 0 days    On average, how many minutes do you engage in exercise at this level?: 0 min  Stress: No Stress Concern Present (08/27/2023)   [employer] of Occupational Health - Occupational Stress Questionnaire    Feeling of Stress : Only a little  Recent Concern: Stress - Stress Concern Present (07/18/2023)   Received from [facility] of Occupational Health - Occupational Stress Questionnaire    Feeling of Stress : To some extent  Social Connections: Socially Integrated (08/28/2023)   Social Connection and Isolation Panel    Frequency of Communication with Friends and Family: More than three times a week    Frequency of Social Gatherings with Friends and Family: More than three times a week    Attends Religious Services: More than 4 times per year    Active Member of [employer] or Organizations: Yes    Attends Banker Meetings: Never    Marital Status: Married  Catering manager Violence: Not At Risk (08/28/2023)   Humiliation, Afraid, Rape, and Kick questionnaire    Fear of Current or Ex-Partner: No    Emotionally Abused: No     Physically Abused: No    Sexually Abused: No    Family History  Adopted: Yes  Problem Relation Age of Onset   Varicose Veins Daughter    Obesity Daughter    Hyperlipidemia Daughter    Diabetes Daughter    COPD Daughter    Depression Daughter    Asthma Daughter    Arthritis Son    Diabetes Son    Hypertension Son     Allergies  Allergen Reactions   Nsaids  Other (See Comments)    Avoid due to kidney disease    Statins Other (See Comments)    Myalgias   Nexletol [Bempedoic Acid] Diarrhea    fatigue   Pedi-Pre Tape Spray [Wound Dressing Adhesive]    Silver Other (See Comments)   Tegaderm High Gelling Alginate [Wound Dressings] Rash    Review of Systems  Constitutional:  Positive for malaise/fatigue.  Psychiatric/Behavioral:         Irritable  All other systems reviewed and are negative.      Objective:   BP (!) 120/58   Pulse 66   Temp 97.9 F (36.6 C)   Ht 5' 5 (1.651 m)   Wt 189 lb 3.2 oz (85.8 kg)   SpO2 97%   BMI 31.48 kg/m   Vitals:   11/22/23 1124  BP: (!) 120/58  Pulse: 66  Temp: 97.9 F (36.6 C)  Height: 5' 5 (1.651 m)  Weight: 189 lb 3.2 oz (85.8 kg)  SpO2: 97%  BMI (Calculated): 31.48    Physical Exam Vitals and nursing note reviewed.  Constitutional:      General: He is awake.     Appearance: Normal appearance. He is normal weight.  Eyes:     Pupils: Pupils are equal, round, and reactive to light.  Cardiovascular:     Rate and Rhythm: Normal rate and regular rhythm.     Pulses: Normal pulses.     Heart sounds: Normal heart sounds.  Pulmonary:     Effort: Pulmonary effort is normal.     Breath sounds: Normal breath sounds.  Neurological:     General: No focal deficit present.     Mental Status: He is alert and oriented to person, place, and time.  Psychiatric:        Attention and Perception: Attention and perception normal.        Mood and Affect: Mood normal. Affect is angry.        Speech: Speech normal.        Behavior:  Behavior is agitated. Behavior is cooperative.        Thought Content: Thought content normal.        Cognition and Memory: Cognition and memory normal.        Judgment: Judgment normal.      Results for orders placed or performed in visit on 11/22/23  CMP14+EGFR  Result Value Ref Range   Glucose 86 70 - 99 mg/dL   BUN 30 (H) 8 - 27 mg/dL   Creatinine, Ser 5.77 (H) 0.76 - 1.27 mg/dL   eGFR 14 (L) >40 fO/fpw/8.26   BUN/Creatinine Ratio 7 (L) 10 - 24   Sodium 138 134 - 144 mmol/L   Potassium 4.4 3.5 - 5.2 mmol/L   Chloride 97 96 - 106 mmol/L   CO2 22 20 - 29 mmol/L   Calcium 9.5 8.6 - 10.2 mg/dL   Total Protein 6.7 6.0 - 8.5 g/dL   Albumin 3.8 3.8 - 4.8 g/dL   Globulin, Total 2.9 1.5 - 4.5 g/dL   Bilirubin Total 0.3 0.0 - 1.2 mg/dL   Alkaline Phosphatase 121 44 - 121 IU/L   AST 16 0 - 40 IU/L   ALT 8 0 - 44 IU/L  Iron, TIBC and Ferritin Panel  Result Value Ref Range   Total Iron Binding Capacity 202 (L) 250 - 450 ug/dL   UIBC 859 888 - 656 ug/dL   Iron 62 38 - 830 ug/dL   Iron Saturation 31  15 - 55 %   Ferritin 529 (H) 30 - 400 ng/mL  POCT CBG (Fasting - Glucose)  Result Value Ref Range   Glucose Fasting, POC 168 (A) 70 - 99 mg/dL    Recent Results (from the past 2160 hours)  CMP14+EGFR     Status: Abnormal   Collection Time: 09/10/23  2:53 PM  Result Value Ref Range   Glucose 97 70 - 99 mg/dL   BUN 20 8 - 27 mg/dL   Creatinine, Ser 7.04 (H) 0.76 - 1.27 mg/dL   eGFR 21 (L) >40 fO/fpw/8.26   BUN/Creatinine Ratio 7 (L) 10 - 24   Sodium 141 134 - 144 mmol/L   Potassium 3.8 3.5 - 5.2 mmol/L   Chloride 101 96 - 106 mmol/L   CO2 24 20 - 29 mmol/L   Calcium 8.6 8.6 - 10.2 mg/dL   Total Protein 6.6 6.0 - 8.5 g/dL   Albumin 3.8 3.8 - 4.8 g/dL   Globulin, Total 2.8 1.5 - 4.5 g/dL   Bilirubin Total 0.4 0.0 - 1.2 mg/dL   Alkaline Phosphatase 89 44 - 121 IU/L   AST 15 0 - 40 IU/L   ALT 5 0 - 44 IU/L  Magnesium      Status: None   Collection Time: 09/10/23  2:53 PM   Result Value Ref Range   Magnesium  1.7 1.6 - 2.3 mg/dL  CBC with Differential/Platelet     Status: Abnormal   Collection Time: 09/20/23 10:03 AM  Result Value Ref Range   WBC 7.8 3.4 - 10.8 x10E3/uL   RBC 3.31 (L) 4.14 - 5.80 x10E6/uL   Hemoglobin 10.7 (L) 13.0 - 17.7 g/dL   Hematocrit 67.0 (L) 62.4 - 51.0 %   MCV 99 (H) 79 - 97 fL   MCH 32.3 26.6 - 33.0 pg   MCHC 32.5 31.5 - 35.7 g/dL   RDW 87.0 88.3 - 84.5 %   Platelets 139 (L) 150 - 450 x10E3/uL   Neutrophils 59 Not Estab. %   Lymphs 28 Not Estab. %   Monocytes 11 Not Estab. %   Eos 1 Not Estab. %   Basos 0 Not Estab. %   Neutrophils Absolute 4.6 1.4 - 7.0 x10E3/uL   Lymphocytes Absolute 2.2 0.7 - 3.1 x10E3/uL   Monocytes Absolute 0.8 0.1 - 0.9 x10E3/uL   EOS (ABSOLUTE) 0.1 0.0 - 0.4 x10E3/uL   Basophils Absolute 0.0 0.0 - 0.2 x10E3/uL   Immature Granulocytes 1 Not Estab. %   Immature Grans (Abs) 0.1 0.0 - 0.1 x10E3/uL  CMP14+EGFR     Status: Abnormal   Collection Time: 09/20/23 10:03 AM  Result Value Ref Range   Glucose 91 70 - 99 mg/dL   BUN 25 8 - 27 mg/dL   Creatinine, Ser 6.55 (H) 0.76 - 1.27 mg/dL   eGFR 17 (L) >40 fO/fpw/8.26   BUN/Creatinine Ratio 7 (L) 10 - 24   Sodium 141 134 - 144 mmol/L   Potassium 4.3 3.5 - 5.2 mmol/L   Chloride 100 96 - 106 mmol/L   CO2 24 20 - 29 mmol/L   Calcium 8.6 8.6 - 10.2 mg/dL   Total Protein 6.9 6.0 - 8.5 g/dL   Albumin 3.9 3.8 - 4.8 g/dL   Globulin, Total 3.0 1.5 - 4.5 g/dL   Bilirubin Total 0.4 0.0 - 1.2 mg/dL   Alkaline Phosphatase 98 44 - 121 IU/L   AST 18 0 - 40 IU/L   ALT 7 0 - 44 IU/L  Hemoglobin A1c  Status: None   Collection Time: 09/20/23 10:03 AM  Result Value Ref Range   Hgb A1c MFr Bld 5.0 4.8 - 5.6 %    Comment:          Prediabetes: 5.7 - 6.4          Diabetes: >6.4          Glycemic control for adults with diabetes: <7.0    Est. average glucose Bld gHb Est-mCnc 97 mg/dL  Lipid panel     Status: Abnormal   Collection Time: 09/20/23 10:03 AM  Result  Value Ref Range   Cholesterol, Total 80 (L) 100 - 199 mg/dL   Triglycerides 896 0 - 149 mg/dL   HDL 40 >60 mg/dL   VLDL Cholesterol Cal 20 5 - 40 mg/dL   LDL Chol Calc (NIH) 20 0 - 99 mg/dL   Chol/HDL Ratio 2.0 0.0 - 5.0 ratio    Comment:                                   T. Chol/HDL Ratio                                             Men  Women                               1/2 Avg.Risk  3.4    3.3                                   Avg.Risk  5.0    4.4                                2X Avg.Risk  9.6    7.1                                3X Avg.Risk 23.4   11.0   TSH     Status: None   Collection Time: 09/20/23 10:03 AM  Result Value Ref Range   TSH 4.050 0.450 - 4.500 uIU/mL  Uric acid     Status: Abnormal   Collection Time: 09/20/23 10:03 AM  Result Value Ref Range   Uric Acid 2.1 (L) 3.8 - 8.4 mg/dL    Comment:            Therapeutic target for gout patients: <6.0  POCT CBG (Fasting - Glucose)     Status: Abnormal   Collection Time: 11/22/23 11:40 AM  Result Value Ref Range   Glucose Fasting, POC 168 (A) 70 - 99 mg/dL  RFE85+ZHQM     Status: Abnormal   Collection Time: 11/22/23 12:14 PM  Result Value Ref Range   Glucose 86 70 - 99 mg/dL   BUN 30 (H) 8 - 27 mg/dL   Creatinine, Ser 5.77 (H) 0.76 - 1.27 mg/dL   eGFR 14 (L) >40 fO/fpw/8.26   BUN/Creatinine Ratio 7 (L) 10 - 24   Sodium 138 134 - 144 mmol/L   Potassium 4.4 3.5 - 5.2 mmol/L   Chloride 97 96 -  106 mmol/L   CO2 22 20 - 29 mmol/L   Calcium 9.5 8.6 - 10.2 mg/dL   Total Protein 6.7 6.0 - 8.5 g/dL   Albumin 3.8 3.8 - 4.8 g/dL   Globulin, Total 2.9 1.5 - 4.5 g/dL   Bilirubin Total 0.3 0.0 - 1.2 mg/dL   Alkaline Phosphatase 121 44 - 121 IU/L   AST 16 0 - 40 IU/L   ALT 8 0 - 44 IU/L  Iron, TIBC and Ferritin Panel     Status: Abnormal   Collection Time: 11/22/23 12:14 PM  Result Value Ref Range   Total Iron Binding Capacity 202 (L) 250 - 450 ug/dL   UIBC 859 888 - 656 ug/dL   Iron 62 38 - 830 ug/dL   Iron  Saturation 31 15 - 55 %   Ferritin 529 (H) 30 - 400 ng/mL        Assessment & Plan Diabetes 1.5, managed as type 2 (HCC) Patient stable.  Well controlled with current therapy.   Continue current meds.   Other fatigue Checking pt. CBC, CMP today in office.  Also checking iron.  Will see if this is possibly the cause of his irritability and/or fatigue.   Constipation, unspecified constipation type Suggested that pt. Use some fiber and/or miralax  to help with bowel movement Will get xr of abdomen today as well.   Will call with results when available.     Return to be determined based on results.   Total time spent: 20 minutes  ALAN CHRISTELLA ARRANT, FNP  11/22/2023   This document may have been prepared by [facility] Voice Recognition software and as such may include unintentional dictation errors.

## 2023-12-11 ENCOUNTER — Encounter: Payer: Self-pay | Admitting: Cardiovascular Disease

## 2023-12-11 ENCOUNTER — Ambulatory Visit (INDEPENDENT_AMBULATORY_CARE_PROVIDER_SITE_OTHER): Admitting: Cardiovascular Disease

## 2023-12-11 VITALS — BP 134/58 | HR 51 | Ht 65.0 in | Wt 186.2 lb

## 2023-12-11 DIAGNOSIS — Z952 Presence of prosthetic heart valve: Secondary | ICD-10-CM | POA: Diagnosis not present

## 2023-12-11 DIAGNOSIS — I10 Essential (primary) hypertension: Secondary | ICD-10-CM

## 2023-12-11 DIAGNOSIS — N186 End stage renal disease: Secondary | ICD-10-CM | POA: Diagnosis not present

## 2023-12-11 DIAGNOSIS — R0602 Shortness of breath: Secondary | ICD-10-CM

## 2023-12-11 DIAGNOSIS — I25118 Atherosclerotic heart disease of native coronary artery with other forms of angina pectoris: Secondary | ICD-10-CM

## 2023-12-11 DIAGNOSIS — I5020 Unspecified systolic (congestive) heart failure: Secondary | ICD-10-CM

## 2023-12-11 NOTE — Progress Notes (Signed)
 Cardiology Office Note   Date:  12/11/2023   ID:  [name], [name] [birth year], MRN [id number]  PCP:  Orlean Alan HERO, FNP  Cardiologist:  Denyse Bathe, MD      History of Present Illness: [name] [name] is a 78 y.o. male who presents for  Chief Complaint  Patient presents with   Follow-up    2 month follow up    Has DOE, and Shortness is better sitting down.      Past Medical History:  Diagnosis Date   Accidental cut, puncture, perforation, or hemorrhage during heart catheterization 12/27/2012   Acute kidney injury superimposed on stage 4 chronic kidney disease (HCC) 07/27/2020   Acute on chronic combined systolic and diastolic CHF (congestive heart failure) (HCC) 04/25/2023   Acute on chronic respiratory failure with hypoxia (HCC) 04/22/2023   Adopted    AKI (acute kidney injury) (HCC) 02/05/2023   ANA positive 07/12/2015   Aortic atherosclerosis (HCC)    Aortic stenosis, severe    a.) s/p TAVR on 08/01/2016: 29 mm CoreValve Evolut placed   Arthritis    Bacteremia due to Streptococcus 04/24/2023   Basal cell carcinoma 01/04/2010   Right sup. post. helix. Excised 03/02/2010   BCC (basal cell carcinoma of skin) 11/24/2020   R neck below the ear, EDC   BPH (benign prostatic hyperplasia)    CAD (coronary artery disease) 12/26/2012   a.) LHC 12/26/2012: 50% mLAD, 80% mLCx, 100% pRCA --> CVTS at [facility] --> decided again revascularization due to insig LAD disease and the fact that RCA lesion was chronic   CAP (community acquired pneumonia) 07/26/2020   Chronic respiratory failure with hypoxia (HCC)    Claudication of both lower extremities (HCC)    Complete tear of right rotator cuff 11/15/2015   Complex tear of lateral meniscus of left knee as current injury 12/06/2020   Complex tear of medial meniscus of left knee as current injury 10/18/2020   COPD (chronic obstructive pulmonary disease) (HCC)    Diarrhea 02/04/2023   Diet-controlled type 2  diabetes mellitus (HCC)    Diverticulosis    Dyspnea    Electrolyte abnormality 02/04/2023   ESRD (end stage renal disease) (HCC)    GERD (gastroesophageal reflux disease)    Gout    Heart murmur    HFrEF (heart failure with reduced ejection fraction) (HCC)    a.) s/p TAVR on 08/01/2016: 29 mm CoreValve Evolut placed   History of bilateral cataract extraction    History of hiatal hernia    HLD (hyperlipidemia)    Hyperkalemia 07/30/2020   Hypertension    Hypomagnesemia 02/05/2023   Hypothyroidism    IDA (iron deficiency anemia)    Migraines    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   a.) TTE 04/24/2023: moderate (MPG 7.5 mmHG)   OSA on CPAP    Pneumonia 07/2020   Pulmonary hypertension (HCC)    a.) TTE 08/17/2022: mild pHTN per echo report; b.) TTE 04/24/2023: RVSP 39.1   Recurrent nephrolithiasis 12/27/2012   Rotator cuff tendinitis, right 11/15/2015   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 MOHS.  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/[name])      Past Surgical History:  Procedure Laterality Date   AORTIC VALVE REPLACEMENT N/A 08/01/2016   29 mm CoreValve Evolut; Location: [facility]; Surgeon: Reyes Fruits, MD   AV FISTULA PLACEMENT Right 06/27/2023   Procedure: ARTERIOVENOUS (AV) FISTULA CREATION (BRACHIALCEPHALIC);  Surgeon: Jama Cordella MATSU, MD;  Location: [facility];  Service: Vascular;  Laterality: Right;   CARDIAC CATHETERIZATION N/A 12/26/2012   2v CAD; retained pigtail in myocardium --> transferred to [facility];  Location: [facility]; Surgeon: Vita Bathe, MD   CATARACT EXTRACTION W/PHACO Left 10/03/2022   Procedure: CATARACT EXTRACTION PHACO AND INTRAOCULAR LENS PLACEMENT (IOC) LEFT DIABETIC 8.22 00:55.5;  Surgeon: Jaye Fallow, MD;  Location: [facility];  Service: Ophthalmology;  Laterality: Left;  sleep apnea   CATARACT EXTRACTION W/PHACO Right 10/17/2022   Procedure: CATARACT EXTRACTION PHACO AND INTRAOCULAR LENS PLACEMENT (IOC) RIGHT DIABETIC;  Surgeon: Jaye Fallow, MD;  Location: [facility];  Service: Ophthalmology;  Laterality: Right;  4.79 0:37.4   COLONOSCOPY     COLONOSCOPY N/A 04/28/2023   Procedure: COLONOSCOPY;  Surgeon: Toledo, Ladell POUR, MD;  Location: [facility];  Service: Gastroenterology;  Laterality: N/A;   DIALYSIS/PERMA CATHETER INSERTION N/A 08/29/2023   Procedure: DIALYSIS/PERMA CATHETER INSERTION;  Surgeon: Jama Cordella MATSU, MD;  Location: [facility];  Service: Cardiovascular;  Laterality: N/A;   DIALYSIS/PERMA CATHETER REMOVAL N/A 11/12/2023   Procedure: DIALYSIS/PERMA CATHETER REMOVAL;  Surgeon: Marea Selinda RAMAN, MD;  Location: [facility];  Service: Cardiovascular;  Laterality: N/A;   KNEE ARTHROSCOPY WITH MEDIAL MENISECTOMY Left 12/02/2020   Procedure: KNEE ARTHROSCOPY WITH DEBRIDEMENT AND PARTIAL MEDIAL AND LATERAL MENISECTOMY;  Surgeon: Edie Norleen PARAS, MD;  Location: [facility];  Service: Orthopedics;  Laterality: Left;   LITHOTRIPSY     PERCUTANEOUS REMOVAL INTRA-AORTIC BALLOON CATH N/A 12/26/2012   Procedure: PERCUTANEOUS REMOVAL INTRA-AORTIC BALLOON CATH; Surgeon: Lang Lannie Salter, MD; Location: [facility]; Service: Cardiothoracic   POLYPECTOMY  04/28/2023   Procedure: POLYPECTOMY;  Surgeon: Aundria, Ladell POUR, MD;  Location: [facility];  Service: Gastroenterology;;   RIGHT HEART CATH Right 07/13/2016   Location: [facility]; Surgeon: Ozell Estelle, MD   TEE WITHOUT CARDIOVERSION Right 04/26/2023   Procedure: TRANSESOPHAGEAL  ECHOCARDIOGRAM (TEE);  Surgeon: Bathe Denyse LABOR, MD;  Location: [facility];  Service: Cardiovascular;  Laterality: Right;   TRANSESOPHAGEAL ECHOCARDIOGRAM N/A 12/26/2012   Procedure: TRANSESOPHAGEAL ECHOCARDIOGRAPHY; Surgeon: Lang Lannie Salter, MD; Location: [facility]; Service: Cardiothoracic   UMBILICAL HERNIA REPAIR N/A 02/13/2014   Procedure: LAPAROSCOPIC UMBILICAL HERNIA REPAIR; Surgeon: Shanda ONEIDA Salter, MD; Location: [facility]; Service: General Surgery     Current Outpatient Medications  Medication Sig Dispense Refill   acetaminophen  (TYLENOL ) 650 MG CR tablet Take 1,300 mg by mouth every 8 (eight) hours as needed for pain.     albuterol  (VENTOLIN  HFA) 108 (90 Base) MCG/ACT inhaler Inhale 2 puffs into the lungs every 6 (six) hours as needed for wheezing or shortness of breath.     Alcohol Swabs (DROPSAFE ALCOHOL PREP) 70 % PADS Apply 1 each topically as needed (With repatha and blood sugar checks.).     Ascorbic Acid  (VITAMIN C ) 1000 MG tablet Take 1,000 mg by mouth at bedtime.     aspirin  EC 81 MG tablet Take 81 mg by mouth daily. Swallow whole.     busPIRone  (BUSPAR ) 5 MG  tablet Take 1 tablet (5 mg total) by mouth at bedtime. 90 tablet 1   calcitRIOL  (ROCALTROL ) 0.25 MCG capsule Take 0.25 mcg by mouth daily.     citalopram  (CELEXA ) 40 MG tablet Take 1 tablet (40 mg total) by mouth daily. 90 tablet 3   Evolocumab 140 MG/ML SOAJ Inject 140 mg into the skin every 14 (fourteen) days.     ezetimibe  (ZETIA ) 10 MG tablet TAKE 1 TABLET BY MOUTH AT BEDTIME 90 tablet 1   febuxostat  (ULORIC ) 40 MG tablet Take 40 mg by mouth at bedtime.     fexofenadine (ALLEGRA) 180 MG tablet Take 180 mg by mouth daily as needed for allergies or rhinitis.     Fluticasone -Umeclidin-Vilant (TRELEGY ELLIPTA ) 100-62.5-25 MCG/ACT AEPB Inhale 1 Inhalation into the lungs daily at 6 (six) AM. 60 each 11   MAGNESIUM  OXIDE 400 PO Take by mouth Nightly.     metolazone (ZAROXOLYN) 5 MG tablet Take 5 mg by  mouth once a week.     metoprolol  succinate (TOPROL -XL) 50 MG 24 hr tablet TAKE 1 TABLET EVERY DAY 90 tablet 3   Multiple Vitamins-Minerals (PRESERVISION AREDS) CAPS Take 1 capsule by mouth 2 (two) times daily.     omeprazole  (PRILOSEC) 40 MG capsule Take 1 capsule (40 mg total) by mouth daily. 30 capsule 3   OXYGEN  Inhale 2-3 L into the lungs See admin instructions. Used as needed throughout the day and continuous at bedtime     SUPER B COMPLEX/C PO Take 1 tablet by mouth daily.     torsemide  (DEMADEX ) 20 MG tablet Take 20 mg by mouth daily. (Patient taking differently: Take 40 mg by mouth 2 (two) times daily.)     No current facility-administered medications for this visit.    Allergies:   Nsaids, Statins, Nexletol [bempedoic acid], Pedi-pre tape spray [wound dressing adhesive], Silver, and Tegaderm high gelling alginate [wound dressings]    Social History:   reports that he quit smoking about 15 years ago. His smoking use included cigarettes. He started smoking about 69 years ago. He has a 108 pack-year smoking history. He quit smokeless tobacco use about 19 years ago.  His smokeless tobacco use included chew. He reports current alcohol use. He reports that he does not use drugs.   Family History:  family history includes Arthritis in his son; Asthma in his daughter; COPD in his daughter; Depression in his daughter; Diabetes in his daughter and son; Hyperlipidemia in his daughter; Hypertension in his son; Obesity in his daughter; Varicose Veins in his daughter. He was adopted.    ROS:     Review of Systems  Constitutional: Negative.   HENT: Negative.    Eyes: Negative.   Respiratory: Negative.    Gastrointestinal: Negative.   Genitourinary: Negative.   Musculoskeletal: Negative.   Skin: Negative.   Neurological: Negative.   Endo/Heme/Allergies: Negative.   Psychiatric/Behavioral: Negative.    All other systems reviewed and are negative.     All other systems are reviewed and  negative.    PHYSICAL EXAM: VS:  BP (!) 134/58   Pulse (!) 51   Ht 5' 5 (1.651 m)   Wt 186 lb 3.2 oz (84.5 kg)   SpO2 (!) 89%   BMI 30.99 kg/m  , BMI Body mass index is 30.99 kg/m. Last weight:  Wt Readings from Last 3 Encounters:  12/11/23 186 lb 3.2 oz (84.5 kg)  12/04/23 182 lb (82.6 kg)  11/22/23 189 lb 3.2 oz (85.8 kg)  Physical Exam Vitals reviewed.  Constitutional:      Appearance: Normal appearance. He is normal weight.  HENT:     Head: Normocephalic.     Nose: Nose normal.     Mouth/Throat:     Mouth: Mucous membranes are moist.  Eyes:     Pupils: Pupils are equal, round, and reactive to light.  Cardiovascular:     Rate and Rhythm: Normal rate and regular rhythm.     Pulses: Normal pulses.     Heart sounds: Normal heart sounds.  Pulmonary:     Effort: Pulmonary effort is normal.  Abdominal:     General: Abdomen is flat. Bowel sounds are normal.  Musculoskeletal:        General: Normal range of motion.     Cervical back: Normal range of motion.  Skin:    General: Skin is warm.  Neurological:     General: No focal deficit present.     Mental Status: He is alert.  Psychiatric:        Mood and Affect: Mood normal.       EKG:   Recent Labs: 04/21/2023: B Natriuretic Peptide 820.9 09/10/2023: Magnesium  1.7 09/20/2023: Hemoglobin 10.7; Platelets 139; TSH 4.050 11/22/2023: ALT 8; BUN 30; Creatinine, Ser 4.22; Potassium 4.4; Sodium 138    Lipid Panel    Component Value Date/Time   CHOL 80 (L) 09/20/2023 1003   CHOL 100 07/26/2012 0111   TRIG 103 09/20/2023 1003   TRIG 157 07/26/2012 0111   HDL 40 09/20/2023 1003   HDL 35 (L) 07/26/2012 0111   CHOLHDL 2.0 09/20/2023 1003   VLDL 31 07/26/2012 0111   LDLCALC 20 09/20/2023 1003   LDLCALC 34 07/26/2012 0111      Other studies Reviewed: Additional studies/ records that were reviewed today include:  Review of the above records demonstrates:       No data to display             ASSESSMENT AND PLAN:    ICD-10-CM   1. HFrEF (heart failure with reduced ejection fraction) (HCC)  I50.20     2. Coronary artery disease involving native coronary artery of native heart with other form of angina pectoris (HCC)  I25.118     3. SOB (shortness of breath)  R06.02    Needs more fluid removed during dilysis. HR today is 60 and not low.    4. S/P TAVR (transcatheter aortic valve replacement)  Z95.2     5. ESRD (end stage renal disease) (HCC)  N18.6        Problem List Items Addressed This Visit       Cardiovascular and Mediastinum   Coronary artery disease   HFrEF (heart failure with reduced ejection fraction) (HCC) - Primary     Genitourinary   ESRD (end stage renal disease) (HCC)     Other   SOB (shortness of breath)   Other Visit Diagnoses       S/P TAVR (transcatheter aortic valve replacement)              Disposition:   Return in about 2 months (around 02/10/2024).    Total time spent: 35 minutes  Signed,  Denyse Bathe, MD  12/11/2023 11:41 AM    [facility]

## 2023-12-18 ENCOUNTER — Other Ambulatory Visit: Payer: Self-pay

## 2023-12-18 ENCOUNTER — Other Ambulatory Visit: Payer: Self-pay | Admitting: *Deleted

## 2023-12-18 NOTE — Patient Outreach (Signed)
 Complex Care Management   Visit Note  12/18/2023  Name:  [name] [name] MRN: [id number] DOB: December 25, 1945  Situation: Referral received for Complex Care Management related to COPD I obtained verbal consent from Patient.  Visit completed with Caregiver Patient  on the phone  Now down to 2 days a week Mondays and Fridays Feels good today  Friday morning had shortness of breath On Monday the wife drove him     Background:   Past Medical History:  Diagnosis Date   Accidental cut, puncture, perforation, or hemorrhage during heart catheterization 12/27/2012   Acute kidney injury superimposed on stage 4 chronic kidney disease (HCC) 07/27/2020   Acute on chronic combined systolic and diastolic CHF (congestive heart failure) (HCC) 04/25/2023   Acute on chronic respiratory failure with hypoxia (HCC) 04/22/2023   Adopted    AKI (acute kidney injury) (HCC) 02/05/2023   ANA positive 07/12/2015   Aortic atherosclerosis (HCC)    Aortic stenosis, severe    a.) s/p TAVR on 08/01/2016: 29 mm CoreValve Evolut placed   Arthritis    Bacteremia due to Streptococcus 04/24/2023   Basal cell carcinoma 01/04/2010   Right sup. post. helix. Excised 03/02/2010   BCC (basal cell carcinoma of skin) 11/24/2020   R neck below the ear, EDC   BPH (benign prostatic hyperplasia)    CAD (coronary artery disease) 12/26/2012   a.) LHC 12/26/2012: 50% mLAD, 80% mLCx, 100% pRCA --> [facility] at [facility] --> decided again revascularization due to insig LAD disease and the fact that RCA lesion was chronic   CAP (community acquired pneumonia) 07/26/2020   Chronic respiratory failure with hypoxia (HCC)    Claudication of both lower extremities (HCC)    Complete tear of right rotator cuff 11/15/2015   Complex tear of lateral meniscus of left knee as current injury 12/06/2020   Complex tear of medial meniscus of left knee as current injury 10/18/2020   COPD (chronic obstructive pulmonary disease) (HCC)    Diarrhea  02/04/2023   Diet-controlled type 2 diabetes mellitus (HCC)    Diverticulosis    Dyspnea    Electrolyte abnormality 02/04/2023   ESRD (end stage renal disease) (HCC)    GERD (gastroesophageal reflux disease)    Gout    Heart murmur    HFrEF (heart failure with reduced ejection fraction) (HCC)    a.) s/p TAVR on 08/01/2016: 29 mm CoreValve Evolut placed   History of bilateral cataract extraction    History of hiatal hernia    HLD (hyperlipidemia)    Hyperkalemia 07/30/2020   Hypertension    Hypomagnesemia 02/05/2023   Hypothyroidism    IDA (iron deficiency anemia)    Migraines    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   a.) TTE 04/24/2023: moderate (MPG 7.5 mmHG)   OSA on CPAP    Pneumonia 07/2020   Pulmonary hypertension (HCC)    a.) TTE 08/17/2022: mild pHTN per echo report; b.) TTE 04/24/2023: RVSP 39.1   Recurrent nephrolithiasis 12/27/2012   Rotator cuff tendinitis, right 11/15/2015   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 MOHS.   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/07/2021  Right zygoma, EDC 05/02/21   Statin intolerance (myalgias)    Supplemental oxygen  dependent (2-3 L/[place])     Assessment: Patient Reported Symptoms:  Cognitive        Neurological      HEENT        Cardiovascular      Respiratory      Endocrine      Gastrointestinal        Genitourinary      Integumentary      Musculoskeletal          Psychosocial            12/18/2023    PHQ2-9 Depression Screening   Little interest or pleasure in doing things    Feeling down, depressed, or hopeless    PHQ-2 - Total Score     Trouble falling or staying asleep, or sleeping too much    Feeling tired or having little energy    Poor appetite or overeating     Feeling bad about yourself - or that you are a failure or have let yourself or your family down    Trouble concentrating on things, such as reading the newspaper or watching television    Moving or speaking so slowly that other people could have noticed.  Or the opposite - being so fidgety or restless that you have been moving around a lot more than usual    Thoughts that you would be better off dead, or hurting yourself in some way    PHQ2-9 Total Score    If you checked off any problems, how difficult have these problems made it for you to do your work, take care of things at home, or get along with other people    Depression Interventions/Treatment      There were no vitals filed for this visit.  Medications Reviewed Today   Medications were not reviewed in this encounter     Recommendation:   PCP Follow-up Continue Current Plan of Care  Follow Up Plan:   Telephone follow up appointment date/time:  pending  Scarleth Brame L. Ramonita, RN, BSN, CCM [place]  [facility], [facility] RN Care Manager Direct Dial: [phone]  Fax: [phone]

## 2023-12-18 NOTE — Patient Instructions (Signed)
 Visit Information  Thank you for taking time to visit with me today. Please don't hesitate to contact me if I can be of assistance to you before our next scheduled appointment.  Your next care management appointment is by telephone on 01/10/24   Please call the care guide team at [phone] if you need to cancel, schedule, or reschedule an appointment.   Please call the Suicide and Crisis Lifeline: 988 call the [place]  National Suicide Prevention Lifeline: 707-719-7336 or TTY: 276-830-4403 TTY 4343708070) to talk to a trained counselor call 1-800-273-TALK (toll free, 24 hour hotline) call 911 if you are experiencing a Mental Health or Behavioral Health Crisis or need someone to talk to.  Gerad Cornelio L. Ramonita, RN, BSN, CCM [place]  [facility], [facility] RN Care Manager Direct Dial: [phone]  Fax: [phone]

## 2023-12-18 NOTE — Progress Notes (Signed)
 12/18/2023 Name: [name] [name] MRN: [id number] DOB: 1946/02/09  Chief Complaint  Patient presents with   Medication Management    [name] [name] is a 78 y.o. year old male who presented for a telephone visit.   They were referred to the pharmacist by their PCP for assistance in managing complex medication management.    Subjective:  Care Team: Primary Care Provider: Orlean Alan HERO, FNP ; Next Scheduled Visit: 01/22/24  Medication Access/Adherence  Current Pharmacy:  [id number] - [place], [place] - [address] [place] [address] Phone: [phone] Fax: [phone]  [id number] [place], [place] - [address] AT [facility] [address] [facility] [address] [place] [address] Phone: [phone] Fax: [phone]  [facility] - [place], [place] - [address] [place] [address] Phone: [phone] Fax: [phone]  [facility] - [place], [place] - [address] [place] [address] Phone: [phone] Fax: [phone]   Patient reports affordability concerns with their medications: No  Patient reports access/transportation concerns to their pharmacy: No  Patient reports adherence concerns with their medications:  No     Med Mgmt -No medication changes since last phone call with pharmacist. -Brought new OTC supplement mentioned during last phone call to cardio visit and cardio instructed patient not to take this supplement due to potassium content -Working on getting a new nebulizer treatment from pulm to address patient's recent breathing concerns, pending insurance approval -Patient's dialysis tx have been adjusted down to M and F, no longer goes on Wed; still having cramping. Report that electrolytes are being monitored at dialysis  Objective:  Lab Results  Component Value Date   HGBA1C 5.0 09/20/2023    Lab Results  Component Value  Date   CREATININE 4.22 ([name]) 11/22/2023   BUN 30 ([name]) 11/22/2023   NA 138 11/22/2023   K 4.4 11/22/2023   CL 97 11/22/2023   CO2 22 11/22/2023    Lab Results  Component Value Date   CHOL 80 (L) 09/20/2023   HDL 40 09/20/2023   LDLCALC 20 09/20/2023   TRIG 103 09/20/2023   CHOLHDL 2.0 09/20/2023    Medications Reviewed Today     Reviewed by [name] [name] (Pharmacist) on 12/18/23 at 1320  Med List Status: <None>   Medication Order Taking? Sig Documenting Provider Last Dose Status Informant  acetaminophen  (TYLENOL ) 650 MG CR tablet [id number] Yes Take 1,300 mg by mouth every 8 (eight) hours as needed for pain. [provider]  Active Spouse/Significant Other  albuterol  (VENTOLIN  HFA) 108 (90 Base) MCG/ACT inhaler [id number] Yes Inhale 2 puffs into the lungs every 6 (six) hours as needed for wheezing or shortness of breath. [provider]  Active Spouse/Significant Other  Alcohol Swabs (DROPSAFE ALCOHOL PREP) 70 % PADS [id number]  Apply 1 each topically as needed (With repatha and blood sugar checks.). [provider]  Active Spouse/Significant Other  Ascorbic Acid  (VITAMIN C ) 1000 MG tablet [id number] Yes Take 1,000 mg by mouth at bedtime. [provider]  Active Spouse/Significant Other  aspirin  EC 81 MG tablet [id number] Yes Take 81 mg by mouth daily. Swallow whole. [provider]  Active   busPIRone  (BUSPAR ) 5 MG tablet [id number] Yes Take 1 tablet (5 mg total) by mouth at bedtime. [name] Alan HERO, FNP  Active   calcitRIOL  (ROCALTROL ) 0.25  MCG capsule [id number] Yes Take 0.25 mcg by mouth daily. [provider]  Active Spouse/Significant Other  citalopram  (CELEXA ) 40 MG tablet [id number] Yes Take 1 tablet (40 mg total) by mouth daily. [name] Alan HERO, FNP  Active   Evolocumab 140 MG/ML [name] [name] [id number] Yes Inject 140 mg into the skin every 14 (fourteen) days. [provider]  Active   ezetimibe  (ZETIA ) 10 MG tablet  [id number] Yes TAKE 1 TABLET BY MOUTH AT BEDTIME [name] Alan HERO, FNP  Active   febuxostat  (ULORIC ) 40 MG tablet [id number] Yes Take 40 mg by mouth at bedtime. [provider]  Active Spouse/Significant Other  fexofenadine (ALLEGRA) 180 MG tablet [id number] Yes Take 180 mg by mouth daily as needed for allergies or rhinitis. [provider]  Active Spouse/Significant Other  Fluticasone -Umeclidin-Vilant (TRELEGY ELLIPTA ) 100-62.5-25 MCG/ACT AEPB [id number] Yes Inhale 1 Inhalation into the lungs daily at 6 (six) AM. [name] Alan HERO, FNP  Active Spouse/Significant Other  MAGNESIUM  OXIDE 400 PO [id number] Yes Take by mouth Nightly. [provider]  Active   metolazone (ZAROXOLYN) 5 MG tablet [id number] Yes Take 5 mg by mouth once a week. [provider]  Active   metoprolol  succinate (TOPROL -XL) 50 MG 24 hr tablet [id number] Yes TAKE 1 TABLET EVERY DAY [name] Alan HERO, FNP  Active Spouse/Significant Other  Multiple Vitamins-Minerals (PRESERVISION AREDS) CAPS [id number] Yes Take 1 capsule by mouth 2 (two) times daily. [provider]  Active Spouse/Significant Other  omeprazole  (PRILOSEC) 40 MG capsule [id number] Yes Take 1 capsule (40 mg total) by mouth daily. [name] Alan HERO, FNP  Active   OXYGEN  [id number] Yes Inhale 2-3 L into the lungs See admin instructions. Used as needed throughout the day and continuous at bedtime [provider]  Active Spouse/Significant Other  SUPER B COMPLEX/C PO [id number]  Take 1 tablet by mouth daily. [provider]  Active Spouse/Significant Other  torsemide  (DEMADEX ) 20 MG tablet [id number]  Take 20 mg by mouth daily.  Patient taking differently: Take 40 mg by mouth 2 (two) times daily.   [provider]  Active Spouse/Significant Other           Med Note [name] [name]   Tue Oct 02, 2023  3:51 PM) 2 tablets daily              Assessment/Plan:   Med Mgmt -Will hold off on starting any  inhaler changes while we wait for patient's new nebulizing treatment to be approved -Continue current medication therapy -Reach out sooner than scheduled follow up with med changes or concerns    Follow Up Plan: 2 months  Jon VEAR Lindau, PharmD Clinical Pharmacist [phone]

## 2023-12-23 DIAGNOSIS — Z992 Dependence on renal dialysis: Secondary | ICD-10-CM | POA: Diagnosis not present

## 2023-12-23 DIAGNOSIS — N186 End stage renal disease: Secondary | ICD-10-CM | POA: Diagnosis not present

## 2023-12-24 DIAGNOSIS — D509 Iron deficiency anemia, unspecified: Secondary | ICD-10-CM | POA: Diagnosis not present

## 2023-12-24 DIAGNOSIS — D631 Anemia in chronic kidney disease: Secondary | ICD-10-CM | POA: Diagnosis not present

## 2023-12-24 DIAGNOSIS — N186 End stage renal disease: Secondary | ICD-10-CM | POA: Diagnosis not present

## 2023-12-24 DIAGNOSIS — Z992 Dependence on renal dialysis: Secondary | ICD-10-CM | POA: Diagnosis not present

## 2023-12-25 ENCOUNTER — Inpatient Hospital Stay: Payer: Self-pay | Attending: Oncology

## 2023-12-25 ENCOUNTER — Encounter: Payer: Self-pay | Admitting: Oncology

## 2023-12-25 ENCOUNTER — Inpatient Hospital Stay (HOSPITAL_BASED_OUTPATIENT_CLINIC_OR_DEPARTMENT_OTHER): Payer: Self-pay | Admitting: Oncology

## 2023-12-25 VITALS — BP 134/56 | HR 86 | Temp 97.2°F | Resp 16 | Wt 183.0 lb

## 2023-12-25 DIAGNOSIS — Z992 Dependence on renal dialysis: Secondary | ICD-10-CM

## 2023-12-25 DIAGNOSIS — D631 Anemia in chronic kidney disease: Secondary | ICD-10-CM | POA: Insufficient documentation

## 2023-12-25 DIAGNOSIS — D509 Iron deficiency anemia, unspecified: Secondary | ICD-10-CM

## 2023-12-25 DIAGNOSIS — N184 Chronic kidney disease, stage 4 (severe): Secondary | ICD-10-CM | POA: Insufficient documentation

## 2023-12-25 DIAGNOSIS — N186 End stage renal disease: Secondary | ICD-10-CM | POA: Diagnosis not present

## 2023-12-25 LAB — COMPREHENSIVE METABOLIC PANEL WITH GFR
ALT: 10 U/L (ref 0–44)
AST: 24 U/L (ref 15–41)
Albumin: 3.1 g/dL — ABNORMAL LOW (ref 3.5–5.0)
Alkaline Phosphatase: 87 U/L (ref 38–126)
Anion gap: 11 (ref 5–15)
BUN: 29 mg/dL — ABNORMAL HIGH (ref 8–23)
CO2: 26 mmol/L (ref 22–32)
Calcium: 9 mg/dL (ref 8.9–10.3)
Chloride: 99 mmol/L (ref 98–111)
Creatinine, Ser: 3.53 mg/dL — ABNORMAL HIGH (ref 0.61–1.24)
GFR, Estimated: 17 mL/min — ABNORMAL LOW
Glucose, Bld: 100 mg/dL — ABNORMAL HIGH (ref 70–99)
Potassium: 4 mmol/L (ref 3.5–5.1)
Sodium: 136 mmol/L (ref 135–145)
Total Bilirubin: 0.7 mg/dL (ref 0.0–1.2)
Total Protein: 7.2 g/dL (ref 6.5–8.1)

## 2023-12-25 LAB — IRON AND TIBC
Iron: 74 ug/dL (ref 45–182)
Saturation Ratios: 34 % (ref 17.9–39.5)
TIBC: 220 ug/dL — ABNORMAL LOW (ref 250–450)
UIBC: 146 ug/dL

## 2023-12-25 LAB — CBC WITH DIFFERENTIAL/PLATELET
Abs Immature Granulocytes: 0.09 K/uL — ABNORMAL HIGH (ref 0.00–0.07)
Basophils Absolute: 0 K/uL (ref 0.0–0.1)
Basophils Relative: 0 %
Eosinophils Absolute: 0.1 K/uL (ref 0.0–0.5)
Eosinophils Relative: 2 %
HCT: 32.8 % — ABNORMAL LOW (ref 39.0–52.0)
Hemoglobin: 10.5 g/dL — ABNORMAL LOW (ref 13.0–17.0)
Immature Granulocytes: 1 %
Lymphocytes Relative: 26 %
Lymphs Abs: 1.9 K/uL (ref 0.7–4.0)
MCH: 31.8 pg (ref 26.0–34.0)
MCHC: 32 g/dL (ref 30.0–36.0)
MCV: 99.4 fL (ref 80.0–100.0)
Monocytes Absolute: 0.8 K/uL (ref 0.1–1.0)
Monocytes Relative: 11 %
Neutro Abs: 4.5 K/uL (ref 1.7–7.7)
Neutrophils Relative %: 60 %
Platelets: 239 K/uL (ref 150–400)
RBC: 3.3 MIL/uL — ABNORMAL LOW (ref 4.22–5.81)
RDW: 15 % (ref 11.5–15.5)
WBC: 7.5 K/uL (ref 4.0–10.5)
nRBC: 0 % (ref 0.0–0.2)

## 2023-12-25 LAB — FERRITIN: Ferritin: 277 ng/mL (ref 24–336)

## 2023-12-25 NOTE — Progress Notes (Signed)
 Hematology/Oncology Consult note [facility]  [phone] [phone]  Patient Care Team: Orlean Alan HERO, FNP as PCP - General (Family Medicine) Melanee Annah BROCKS, MD as Consulting Physician (Oncology) Ramonita Suzen CROME, RN as [facility] (Optometry) Polo, Jon DEL, [place] (Pharmacist) Theotis Lavelle BRAVO, MD as Referring Physician (Specialist) Lennie Barter, DPM as Consulting Physician (Podiatry) Claudene Lehmann, MD as Referring Physician (Dermatology) Jama, Cordella MATSU, MD (Vascular Surgery)   Name of the patient: [name] [name]  [id number]  05/16/1945   Date of visit: 12/25/23  Diagnosis-anemia of chronic kidney disease  Chief complaint/ Reason for visit-routine follow-up of anemia  Heme/Onc history: Patient is a 78 year old male with a past medical history significant for systolic congestive heart failure, type 2 diabetes, obstructive sleep apnea on CPAP, CKD, BPH among other medical problems.  He has been referred to us  for anemia.  Most recent CBC from 10/04/2021 showed white cell count of 6.8, H&H of 10.3/30.4 with a platelet count of 179.  His baseline hemoglobin runs around 12 and was around that value until March 2023.  Patient does have baseline chronic kidney disease with a creatinine that fluctuates between 2.4-3.1.  Patient follows up with Dr. Lazarus for his stage IV chronic kidney disease.     Results of blood work from 10/10/2021 were as follows: CBC showed white count of 5.5, H&H of 10.3/30.5 with a platelet count of 198.  CMP showed a serum creatinine of 3.38.  B12 folate normal.  Haptoglobin TSH normal.  Myeloma panel showed no M protein.  Ferritin levels normal at 213.  Patient is presently on hemodialysis twice a week    Interval history-he has baseline fatigue and exertional shortness of breath.  He started dialysis about 6 months ago.  ECOG PS- 1 Pain scale- 0   Review of  systems- Review of Systems  Constitutional:  Positive for malaise/fatigue. Negative for chills, fever and weight loss.  HENT:  Negative for congestion, ear discharge and nosebleeds.   Eyes:  Negative for blurred vision.  Respiratory:  Positive for shortness of breath. Negative for cough, hemoptysis, sputum production and wheezing.   Cardiovascular:  Negative for chest pain, palpitations, orthopnea and claudication.  Gastrointestinal:  Negative for abdominal pain, blood in stool, constipation, diarrhea, heartburn, melena, nausea and vomiting.  Genitourinary:  Negative for dysuria, flank pain, frequency, hematuria and urgency.  Musculoskeletal:  Negative for back pain, joint pain and myalgias.  Skin:  Negative for rash.  Neurological:  Negative for dizziness, tingling, focal weakness, seizures, weakness and headaches.  Endo/Heme/Allergies:  Does not bruise/bleed easily.  Psychiatric/Behavioral:  Negative for depression and suicidal ideas. The patient does not have insomnia.       Allergies  Allergen Reactions   Nsaids Other (See Comments)    Avoid due to kidney disease    Statins Other (See Comments)    Myalgias   Nexletol [Bempedoic Acid] Diarrhea    fatigue   Pedi-Pre Tape Spray [Wound Dressing Adhesive]    Silver Other (See Comments)   Tegaderm High Gelling Alginate [Wound Dressings] Rash     Past Medical History:  Diagnosis Date   Accidental cut, puncture, perforation, or hemorrhage during heart catheterization 12/27/2012   Acute kidney injury superimposed on stage 4 chronic kidney disease (HCC) 07/27/2020   Acute on chronic combined systolic and diastolic CHF (congestive heart failure) (HCC) 04/25/2023   Acute on chronic respiratory failure with hypoxia (HCC) 04/22/2023   Adopted  AKI (acute kidney injury) (HCC) 02/05/2023   ANA positive 07/12/2015   Aortic atherosclerosis (HCC)    Aortic stenosis, severe    a.) s/p TAVR on 08/01/2016: 29 mm CoreValve Evolut placed    Arthritis    Bacteremia due to Streptococcus 04/24/2023   Basal cell carcinoma 01/04/2010   Right sup. post. helix. Excised 03/02/2010   BCC (basal cell carcinoma of skin) 11/24/2020   R neck below the ear, EDC   BPH (benign prostatic hyperplasia)    CAD (coronary artery disease) 12/26/2012   a.) LHC 12/26/2012: 50% mLAD, 80% mLCx, 100% pRCA --> [facility]TS at [facility] --> decided again revascularization due to insig LAD disease and the fact that RCA lesion was chronic   CAP (community acquired pneumonia) 07/26/2020   Chronic respiratory failure with hypoxia (HCC)    Claudication of both lower extremities (HCC)    Complete tear of right rotator cuff 11/15/2015   Complex tear of lateral meniscus of left knee as current injury 12/06/2020   Complex tear of medial meniscus of left knee as current injury 10/18/2020   COPD (chronic obstructive pulmonary disease) (HCC)    Diarrhea 02/04/2023   Diet-controlled type 2 diabetes mellitus (HCC)    Diverticulosis    Dyspnea    Electrolyte abnormality 02/04/2023   ESRD (end stage renal disease) (HCC)    GERD (gastroesophageal reflux disease)    Gout    Heart murmur    HFrEF (heart failure with reduced ejection fraction) (HCC)    a.) s/p TAVR on 08/01/2016: 29 mm CoreValve Evolut placed   History of bilateral cataract extraction    History of hiatal hernia    HLD (hyperlipidemia)    Hyperkalemia 07/30/2020   Hypertension    Hypomagnesemia 02/05/2023   Hypothyroidism    IDA (iron deficiency anemia)    Migraines    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   a.) TTE 04/24/2023: moderate (MPG 7.5 mmHG)   OSA on CPAP    Pneumonia 07/2020   Pulmonary hypertension (HCC)    a.) TTE 08/17/2022: mild pHTN per echo report; b.) TTE 04/24/2023: RVSP 39.1   Recurrent nephrolithiasis 12/27/2012   Rotator cuff tendinitis, right 11/15/2015   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 MOHS.   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/[place])      Past Surgical History:  Procedure Laterality Date   AORTIC VALVE REPLACEMENT N/A 08/01/2016   29 mm CoreValve Evolut; Location: [facility]; Surgeon: Reyes Fruits, MD   AV FISTULA PLACEMENT Right 06/27/2023   Procedure: ARTERIOVENOUS (AV) FISTULA CREATION (BRACHIALCEPHALIC);  Surgeon: Jama Cordella MATSU, MD;  Location: [facility];  Service: Vascular;  Laterality: Right;   CARDIAC CATHETERIZATION N/A 12/26/2012   2v CAD; retained pigtail in myocardium --> transferred to [facility]; Location: [facility]; Surgeon: Vita Bathe, MD   CATARACT EXTRACTION W/PHACO Left 10/03/2022   Procedure: CATARACT EXTRACTION PHACO AND INTRAOCULAR LENS PLACEMENT (IOC) LEFT DIABETIC 8.22 00:55.5;  Surgeon: Jaye Fallow, MD;  Location: [facility];  Service: Ophthalmology;  Laterality: Left;  sleep apnea   CATARACT EXTRACTION W/PHACO Right 10/17/2022   Procedure: CATARACT EXTRACTION PHACO AND INTRAOCULAR LENS PLACEMENT (IOC) RIGHT DIABETIC;  Surgeon: Jaye Fallow, MD;  Location: [facility];  Service: Ophthalmology;  Laterality: Right;  4.79 0:37.4   COLONOSCOPY     COLONOSCOPY N/A 04/28/2023   Procedure: COLONOSCOPY;  Surgeon: Toledo, Ladell POUR, MD;  Location: [facility];  Service: Gastroenterology;  Laterality: N/A;   DIALYSIS/PERMA CATHETER INSERTION N/A 08/29/2023   Procedure: DIALYSIS/PERMA CATHETER INSERTION;  Surgeon: Jama Cordella MATSU, MD;  Location: [facility];  Service: Cardiovascular;  Laterality: N/A;   DIALYSIS/PERMA CATHETER REMOVAL N/A 11/12/2023    Procedure: DIALYSIS/PERMA CATHETER REMOVAL;  Surgeon: Marea Selinda RAMAN, MD;  Location: [facility];  Service: Cardiovascular;  Laterality: N/A;   KNEE ARTHROSCOPY WITH MEDIAL MENISECTOMY Left 12/02/2020   Procedure: KNEE ARTHROSCOPY WITH DEBRIDEMENT AND PARTIAL MEDIAL AND LATERAL MENISECTOMY;  Surgeon: Edie Norleen PARAS, MD;  Location: [facility];  Service: Orthopedics;  Laterality: Left;   LITHOTRIPSY     PERCUTANEOUS REMOVAL INTRA-AORTIC BALLOON CATH N/A 12/26/2012   Procedure: PERCUTANEOUS REMOVAL INTRA-AORTIC BALLOON CATH; Surgeon: Lang Lannie Salter, MD; Location: [facility]; Service: Cardiothoracic   POLYPECTOMY  04/28/2023   Procedure: POLYPECTOMY;  Surgeon: Aundria, Ladell POUR, MD;  Location: [facility];  Service: Gastroenterology;;   RIGHT HEART CATH Right 07/13/2016   Location: [facility]; Surgeon: Ozell Estelle, MD   TEE WITHOUT CARDIOVERSION Right 04/26/2023   Procedure: TRANSESOPHAGEAL ECHOCARDIOGRAM (TEE);  Surgeon: Fernand Denyse LABOR, MD;  Location: [facility];  Service: Cardiovascular;  Laterality: Right;   TRANSESOPHAGEAL ECHOCARDIOGRAM N/A 12/26/2012   Procedure: TRANSESOPHAGEAL ECHOCARDIOGRAPHY; Surgeon: Lang Lannie Salter, MD; Location: [facility]; Service: Cardiothoracic   UMBILICAL HERNIA REPAIR N/A 02/13/2014   Procedure: LAPAROSCOPIC UMBILICAL HERNIA REPAIR; Surgeon: Shanda ONEIDA Salter, MD; Location: [facility]; Service: General Surgery    Social History   Socioeconomic History   Marital status: Married    Spouse name: Osker Ayoub   Number of children: 2   Years of education: Not on file   Highest education level: Not on file  Occupational History   Occupation: retired  Tobacco Use   Smoking status: Former    Current packs/day: 0.00    Average packs/day: 2.0 packs/day for 54.0 years (108.0 ttl pk-yrs)    Types: Cigarettes    Start date: 43    Quit date: 2010    Years since quitting: 15.6   Smokeless tobacco: Former    Types: Chew     Quit date: 05/16/2004  Vaping Use   Vaping status: Never Used  Substance and Sexual Activity   Alcohol use: Yes    Comment: rare beer   Drug use: Never   Sexual activity: Not on file  Other Topics Concern   Not on file  Social History Narrative   Not on file   Social Drivers of Health   Financial Resource Strain: Low Risk  (08/27/2023)   Overall Financial Resource Strain (CARDIA)    Difficulty of Paying Living Expenses: Not very hard  Recent Concern: Financial Resource Strain - High Risk (07/24/2023)   Received from [facility]   Overall Financial Resource Strain (CARDIA)    Difficulty of Paying Living Expenses: Hard  Food Insecurity: No Food Insecurity (08/28/2023)   Hunger Vital Sign    Worried About Running Out of Food in the Last Year: Never true    Ran Out of Food in the Last Year: Never true  Recent Concern: Food Insecurity - Food Insecurity Present (07/12/2023)   [facility]   Hunger Vital Sign    Worried About Running Out of Food in the Last Year: Sometimes true    Ran Out of Food in the Last Year: Sometimes true  Transportation Needs: No Transportation Needs (08/28/2023)   PRAPARE - Administrator, Civil Service (Medical): No    Lack of Transportation (Non-Medical): No  Physical Activity: Inactive (07/18/2023)   Received from [facility]   Exercise Vital Sign    On average, how many days per week do you engage in moderate to strenuous exercise (like a brisk walk)?: 0 days    On average, how many minutes do you engage in exercise at this level?: 0 min  Stress: No Stress Concern Present (08/27/2023)   [employer] of Occupational Health - Occupational Stress Questionnaire    Feeling of Stress : Only a little  Recent Concern: Stress - Stress Concern Present (07/18/2023)   Received from [facility] of Occupational Health - Occupational Stress Questionnaire     Feeling of Stress : To some extent  Social Connections: Socially Integrated (08/28/2023)   Social Connection and Isolation Panel    Frequency of Communication with Friends and Family: More than three times a week    Frequency of Social Gatherings with Friends and Family: More than three times a week    Attends Religious Services: More than 4 times per year    Active Member of [employer] or Organizations: Yes    Attends Banker Meetings: Never    Marital Status: Married  Catering manager Violence: Not At Risk (08/28/2023)   Humiliation, Afraid, Rape, and Kick questionnaire    Fear of Current or Ex-Partner: No    Emotionally Abused: No    Physically Abused: No    Sexually Abused: No    Family History  Adopted: Yes  Problem Relation Age of Onset   Varicose Veins Daughter    Obesity Daughter    Hyperlipidemia Daughter    Diabetes Daughter    COPD Daughter    Depression Daughter    Asthma Daughter    Arthritis Son    Diabetes Son    Hypertension Son      Current Outpatient Medications:    acetaminophen  (TYLENOL ) 650 MG CR tablet, Take 1,300 mg by mouth every 8 (eight) hours as needed for pain., Disp: , Rfl:    albuterol  (VENTOLIN  HFA) 108 (90 Base) MCG/ACT inhaler, Inhale 2 puffs into the lungs every 6 (six) hours as needed for wheezing or shortness of breath., Disp: , Rfl:    Alcohol Swabs (DROPSAFE ALCOHOL PREP) 70 % PADS, Apply 1 each topically as needed (With repatha and blood sugar checks.)., Disp: , Rfl:    Ascorbic Acid  (VITAMIN C ) 1000 MG tablet, Take 1,000 mg by mouth at bedtime., Disp: , Rfl:    aspirin  EC 81 MG tablet, Take 81 mg by mouth daily. Swallow whole., Disp: , Rfl:    busPIRone  (BUSPAR ) 5 MG tablet, Take 1 tablet (5 mg total) by mouth at bedtime., Disp: 90 tablet, Rfl: 1   calcitRIOL  (ROCALTROL ) 0.25 MCG capsule, Take 0.25 mcg by mouth daily., Disp: , Rfl:    citalopram  (CELEXA ) 40 MG tablet, Take 1 tablet (40 mg total) by mouth daily., Disp: 90 tablet,  Rfl: 3   Evolocumab 140 MG/ML SOAJ, Inject 140 mg into the skin every  14 (fourteen) days., Disp: , Rfl:    ezetimibe  (ZETIA ) 10 MG tablet, TAKE 1 TABLET BY MOUTH AT BEDTIME, Disp: 90 tablet, Rfl: 1   febuxostat  (ULORIC ) 40 MG tablet, Take 40 mg by mouth at bedtime., Disp: , Rfl:    fexofenadine (ALLEGRA) 180 MG tablet, Take 180 mg by mouth daily as needed for allergies or rhinitis., Disp: , Rfl:    Fluticasone -Umeclidin-Vilant (TRELEGY ELLIPTA ) 100-62.5-25 MCG/ACT AEPB, Inhale 1 Inhalation into the lungs daily at 6 (six) AM., Disp: 60 each, Rfl: 11   MAGNESIUM  OXIDE 400 PO, Take by mouth Nightly., Disp: , Rfl:    metolazone (ZAROXOLYN) 5 MG tablet, Take 5 mg by mouth once a week., Disp: , Rfl:    metoprolol  succinate (TOPROL -XL) 50 MG 24 hr tablet, TAKE 1 TABLET EVERY DAY, Disp: 90 tablet, Rfl: 3   Multiple Vitamins-Minerals (PRESERVISION AREDS) CAPS, Take 1 capsule by mouth 2 (two) times daily., Disp: , Rfl:    omeprazole  (PRILOSEC) 40 MG capsule, Take 1 capsule (40 mg total) by mouth daily., Disp: 30 capsule, Rfl: 3   OXYGEN , Inhale 2-3 L into the lungs See admin instructions. Used as needed throughout the day and continuous at bedtime, Disp: , Rfl:    SUPER B COMPLEX/C PO, Take 1 tablet by mouth daily., Disp: , Rfl:    torsemide  (DEMADEX ) 20 MG tablet, Take 20 mg by mouth daily. (Patient taking differently: Take 40 mg by mouth 2 (two) times daily.), Disp: , Rfl:   Physical exam: There were no vitals filed for this visit. Physical Exam Constitutional:      Comments: He is on home oxygen   Cardiovascular:     Rate and Rhythm: Normal rate and regular rhythm.     Heart sounds: Normal heart sounds.  Pulmonary:     Effort: Pulmonary effort is normal.     Breath sounds: Normal breath sounds.  Skin:    General: Skin is warm and dry.  Neurological:     Mental Status: He is alert and oriented to person, place, and time.      I have personally reviewed labs listed below:    Latest Ref Rng &  Units 11/22/2023   12:14 PM  CMP  Glucose 70 - 99 mg/dL 86   BUN 8 - 27 mg/dL 30   Creatinine 9.23 - 1.27 mg/dL 5.77   Sodium 865 - 855 mmol/L 138   Potassium 3.5 - 5.2 mmol/L 4.4   Chloride 96 - 106 mmol/L 97   CO2 20 - 29 mmol/L 22   Calcium 8.6 - 10.2 mg/dL 9.5   Total Protein 6.0 - 8.5 g/dL 6.7   Total Bilirubin 0.0 - 1.2 mg/dL 0.3   Alkaline Phos 44 - 121 IU/L 121   AST 0 - 40 IU/L 16   ALT 0 - 44 IU/L 8       Latest Ref Rng & Units 12/25/2023   12:37 PM  CBC  WBC 4.0 - 10.5 K/uL 7.5   Hemoglobin 13.0 - 17.0 g/dL 89.4   Hematocrit 60.9 - 52.0 % 32.8   Platelets 150 - 400 K/uL 239    I have personally reviewed Radiology images listed below: No images are attached to the encounter.  DG Chest 2 View Result Date: 12/04/2023 CLINICAL DATA:  Shortness of breath. EXAM: CHEST - 2 VIEW COMPARISON:  04/22/2023. FINDINGS: The cardiopericardial silhouette is enlarged, similar to the prior exam. Aortic atherosclerosis. Central pulmonary vascular congestion with diffuse bilateral interstitial prominence, suggestive of  interstitial pulmonary edema. No sizable pleural effusion. No pneumothorax. Diffuse osseous demineralization. No acute osseous abnormality. IMPRESSION: Cardiomegaly with central pulmonary vascular congestion and diffuse bilateral interstitial prominence, suggestive of interstitial pulmonary edema. Electronically Signed   By: Harrietta Sherry M.D.   On: 12/04/2023 11:55     Assessment and plan- Patient is a 78 y.o. male here for routine follow-up of anemia of chronic kidney disease  Patient's hemoglobin has remained stable between 10-11 over the last 2 years.  Iron studies from today are pending.  Since he is on hemodialysis he has been getting EPO and IV iron through dialysis.  Given the stability in his counts I will repeat CBC ferritin and iron studies in 6 months in 1 year and see him back in 1 year   Visit Diagnosis 1. Anemia in chronic kidney disease, on chronic dialysis  [facility])      Dr. Annah Skene, MD, MPH [facility] [phone] 12/25/2023 12:58 PM

## 2023-12-28 ENCOUNTER — Telehealth: Payer: Self-pay

## 2023-12-28 NOTE — Telephone Encounter (Signed)
 Patient wife called and stated that the patients Magnesium  was low during dialysis at 1.8 and he's taking magnesium  oxide, she is wanting to know if there is a shot he can take, she states that he cramps so bad with having to do the dialysis

## 2024-01-02 ENCOUNTER — Telehealth: Payer: Self-pay | Admitting: Family

## 2024-01-02 NOTE — Telephone Encounter (Signed)
 [name] left VM that they need his Repatha PA done. Please advise patient when this is completed.

## 2024-01-03 NOTE — Telephone Encounter (Signed)
 Just received this paperwork today, already completed and faxed back to the company

## 2024-01-04 DIAGNOSIS — J449 Chronic obstructive pulmonary disease, unspecified: Secondary | ICD-10-CM | POA: Diagnosis not present

## 2024-01-10 ENCOUNTER — Other Ambulatory Visit: Payer: Self-pay | Admitting: *Deleted

## 2024-01-10 ENCOUNTER — Encounter: Payer: Self-pay | Admitting: *Deleted

## 2024-01-10 NOTE — Patient Outreach (Signed)
 Complex Care Management   Visit Note  01/31/2024 update for 9/11/256  Name:  [name] [name] MRN: [id number] DOB: 16-Aug-1945  Situation: Referral received for Complex Care Management related to Chronic Kidney Disease I obtained verbal consent from Caregiver wife.  Visit completed with Caregiver wife  on the phone [name] [name] out socializing with his friends Administrator, [employer] ([employer])   Hemodialysis (HD), Still cramping, started new allergy medicine Positive for shortness of breath (sob) 01/09/24 turned up the concentrator Goes to HD now only twice a week for 4 hour sessions Wife reports he wears thick clothing, gloves, a beanie and carries a blanket to HD sessions Has received an iron shot per wife  Anemia wife voiced understanding of patient's worsening symptoms like staying cold, fatigue, poor appetite, continued episodes of shortness of breath having to increase oxygen  to breath better, low rbc, Hemaglobin/hematocrit values since 04/2023 to present are related to his anemia and this is causing some of the concerns during his HD sessions (cramping, fatigue, loss of interest in doing things, less appetite)  (rbc range 3.04 to 3.42, normal value of 4.22 to 5.81  + hgb range 9.6 to 11.1, normal range is 13-17  + hct range 29.7- 32.9, normal values are 39- 52)   Wife and RN CM discussed the symptoms and cause in detail  She reports patient has received iron shots not infusions. She reports she has experience with iron infusions.   Poor appetite and losing weight  She confirms a recent weight of 184 lbs- Does not eat larger portion sizes like he previous and this concerns [name] [name] Wife not use to him eating less portion She voices understanding that [name] [name] is maintaining his weight in the 180's with a BMI of  30. 62 indicates the patient is not malnourished   Socialization - today down to store with friends  has moments of agitation I got to go back to  prison tomorrow On Celexa , Buspar   Agitated more  Background:   Past Medical History:  Diagnosis Date   Accidental cut, puncture, perforation, or hemorrhage during heart catheterization 12/27/2012   Acute kidney injury superimposed on stage 4 chronic kidney disease (HCC) 07/27/2020   Acute on chronic combined systolic and diastolic CHF (congestive heart failure) (HCC) 04/25/2023   Acute on chronic respiratory failure with hypoxia (HCC) 04/22/2023   Adopted    AKI (acute kidney injury) 02/05/2023   ANA positive 07/12/2015   Aortic atherosclerosis    Aortic stenosis, severe    a.) s/p TAVR on 08/01/2016: 29 mm CoreValve Evolut placed   Arthritis    Bacteremia due to Streptococcus 04/24/2023   Basal cell carcinoma 01/04/2010   Right sup. post. helix. Excised 03/02/2010   BCC (basal cell carcinoma of skin) 11/24/2020   R neck below the ear, EDC   BPH (benign prostatic hyperplasia)    CAD (coronary artery disease) 12/26/2012   a.) LHC 12/26/2012: 50% mLAD, 80% mLCx, 100% pRCA --> CVTS at [facility] --> decided again revascularization due to insig LAD disease and the fact that RCA lesion was chronic   CAP (community acquired pneumonia) 07/26/2020   Chronic respiratory failure with hypoxia (HCC)    Claudication of both lower extremities    Complete tear of right rotator cuff 11/15/2015   Complex tear of lateral meniscus of left knee as current injury 12/06/2020   Complex tear of medial meniscus of left knee as current injury 10/18/2020   COPD (chronic obstructive pulmonary disease) (HCC)  Diarrhea 02/04/2023   Diet-controlled type 2 diabetes mellitus (HCC)    Diverticulosis    Dyspnea    Electrolyte abnormality 02/04/2023   ESRD (end stage renal disease) (HCC)    GERD (gastroesophageal reflux disease)    Gout    Heart murmur    HFrEF (heart failure with reduced ejection fraction) (HCC)    a.) s/p TAVR on 08/01/2016: 29 mm CoreValve Evolut placed   History of bilateral cataract  extraction    History of hiatal hernia    HLD (hyperlipidemia)    Hyperkalemia 07/30/2020   Hypertension    Hypomagnesemia 02/05/2023   Hypothyroidism    IDA (iron deficiency anemia)    Migraines    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   a.) TTE 04/24/2023: moderate (MPG 7.5 mmHG)   OSA on CPAP    Pneumonia 07/2020   Pulmonary hypertension (HCC)    a.) TTE 08/17/2022: mild pHTN per echo report; b.) TTE 04/24/2023: RVSP 39.1   Recurrent nephrolithiasis 12/27/2012   Rotator cuff tendinitis, right 11/15/2015   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 MOHS.   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/07/2021   Right zygoma, [facility] 05/02/21   Statin intolerance (myalgias)    Supplemental oxygen  dependent (2-3 L/[place])     Assessment: Patient Reported Symptoms:  Cognitive Cognitive Status: No symptoms reported, Insightful and able to interpret abstract concepts, Normal speech and language skills, Alert and oriented to person, place, and time Cognitive/Intellectual Conditions Management [RPT]: None reported or documented in medical history or problem list   Health Maintenance Behaviors: Annual physical exam, Hobbies, Sleep adequate, Social activities  Neurological Neurological Review of Symptoms: Weakness Neurological Management Strategies: Adequate rest, Routine screening Neurological Self-Management Outcome: 3 (uncertain)  HEENT HEENT Symptoms Reported: Sudden change or loss of vision HEENT Management Strategies: Medication therapy, Routine screening, Adequate rest HEENT Self-Management Outcome: 3 (uncertain) Ear problem(s), Vision problem(s)   Cardiovascular Cardiovascular Symptoms Reported: Fatigue Does patient have uncontrolled Hypertension?: Yes Is patient checking Blood Pressure at home?: No Cardiovascular Management Strategies: Adequate rest, Diet modification, Routine screening Weight: 184 lb (83.5 kg) Cardiovascular Self-Management Outcome: 3 (uncertain)  Respiratory Respiratory Symptoms Reported: Shortness of breath Respiratory Management Strategies: Adequate rest, Breathing techniques, Oxygen  therapy Respiratory Self-Management Outcome: 3 (uncertain)  Endocrine Endocrine Symptoms Reported: Irritability, Shortness of breath, Weakness or fatigue Is patient diabetic?: Yes Is patient checking blood sugars at home?: Yes Endocrine Self-Management Outcome: 3 (uncertain)  Gastrointestinal Gastrointestinal Symptoms Reported: Change in appetite Gastrointestinal Management Strategies: Adequate rest, Diet modification, Fluid modification, Medication therapy Gastrointestinal Self-Management Outcome: 3 (uncertain) Nutrition Risk Screen (CP): Reduced oral intake over the last month  Genitourinary Genitourinary Symptoms Reported: Other Other Genitourinary Symptoms: cramping in HD Genitourinary Management Strategies: Activity, Adequate rest, Coping strategies, Hemodialysis, Fluid modification, Medication therapy Hemodialysis Last Treatment: 01/09/24 Genitourinary Self-Management Outcome: 3 (uncertain)  Integumentary Integumentary Symptoms Reported: No symptoms reported Skin Self-Management Outcome: 4 (good)  Musculoskeletal Musculoskelatal Symptoms Reviewed: No symptoms reported Musculoskeletal Management Strategies: Routine screening Musculoskeletal Self-Management Outcome: 4 (good) Falls in the past year?: No Number of falls in past year: 1 or less Was there an injury with Fall?: No  Fall Risk Category Calculator: 0 Patient Fall Risk Level: Low Fall Risk Patient at Risk for Falls Due to: Other (Comment) (HOH) Fall risk Follow  up: Falls evaluation completed  Psychosocial Psychosocial Symptoms Reported: Alteration in eating habits, Irritability Behavioral Management Strategies: Adequate rest, Coping strategies, Support system Major Change/Loss/Stressor/Fears (CP): Medical condition, self Techniques to Cope with Loss/Stress/Change: Diversional activities Quality of Family Relationships: helpful, involved, supportive Do you feel physically threatened by others?: No    01/31/2024    PHQ2-9 Depression Screening   Little interest or pleasure in doing things Several days  Feeling down, depressed, or hopeless Several days  PHQ-2 - Total Score 2  Trouble falling or staying asleep, or sleeping too much Several days  Feeling tired or having little energy More than half the days  Poor appetite or overeating  Several days  Feeling bad about yourself - or that you are a failure or have let yourself or your family down Several days  Trouble concentrating on things, such as reading the newspaper or watching television Not at all  Moving or speaking so slowly that other people could have noticed.  Or the opposite - being so fidgety or restless that you have been moving around a lot more than usual Not at all  Thoughts that you would be better off dead, or hurting yourself in some way Not at all  PHQ2-9 Total Score 7  If you checked off any problems, how difficult have these problems made it for you to do your work, take care of things at home, or get along with other people Somewhat difficult  Depression Interventions/Treatment      There were no vitals filed for this visit.  Medications Reviewed Today     Reviewed by Ramonita Suzen CROME, RN (Registered Nurse) on 01/10/24 at 1620  Med List Status: <None>   Medication Order Taking? Sig Documenting Provider Last Dose Status Informant  acetaminophen  (TYLENOL ) 650 MG CR tablet [id number] Yes Take 1,300 mg by mouth every 8 (eight) hours as needed for pain. [provider]   Active Spouse/Significant Other  albuterol  (VENTOLIN  HFA) 108 (90 Base) MCG/ACT inhaler [id number]  Inhale 2 puffs into the lungs every 6 (six) hours as needed for wheezing or shortness of breath. [provider]  Active Spouse/Significant Other  Alcohol Swabs (DROPSAFE ALCOHOL PREP) 70 % PADS [id number] Yes Apply 1 each topically as needed (With repatha and blood sugar checks.). [provider]  Active Spouse/Significant Other  Ascorbic Acid  (VITAMIN C ) 1000 MG tablet [id number] Yes Take 1,000 mg by mouth at bedtime. [provider]  Active Spouse/Significant Other  aspirin  EC 81 MG tablet [id number] Yes Take 81 mg by mouth daily. Swallow whole. [provider]  Active   busPIRone  (BUSPAR ) 5 MG tablet [id number] Yes Take 1 tablet (5 mg total) by mouth at bedtime. Orlean Alan HERO, FNP  Active   calcitRIOL  (ROCALTROL ) 0.25 MCG capsule [id number] Yes Take 0.25 mcg by mouth daily. [provider]  Active Spouse/Significant Other  citalopram  (CELEXA ) 40 MG tablet [id number] Yes Take 1 tablet (40 mg total) by mouth daily. Orlean Alan HERO, FNP  Active   Evolocumab 140 MG/ML SOAJ [id number]  Inject 140 mg into the skin every 14 (fourteen) days. [provider]  Active   ezetimibe  (ZETIA ) 10 MG tablet [id number]  TAKE 1 TABLET BY MOUTH AT BEDTIME Orlean Alan HERO, FNP  Active   febuxostat  (ULORIC ) 40 MG tablet [id number]  Take 40 mg by mouth at bedtime.  [provider]  Active Spouse/Significant Other  fexofenadine (ALLEGRA) 180 MG tablet [id number]  Take 180 mg by mouth daily as needed for allergies or rhinitis. [provider]  Active Spouse/Significant Other  Fluticasone -Umeclidin-Vilant (TRELEGY ELLIPTA ) 100-62.5-25 MCG/ACT AEPB [id number]  Inhale 1 Inhalation into the lungs daily at 6 (six) AM. Orlean Alan HERO, FNP  Active Spouse/Significant Other  MAGNESIUM  OXIDE 400 PO [id number] Yes Take by mouth Nightly. [provider]  Active    metolazone (ZAROXOLYN) 5 MG tablet [id number]  Take 5 mg by mouth once a week. [provider]  Active   metoprolol  succinate (TOPROL -XL) 50 MG 24 hr tablet [id number]  TAKE 1 TABLET EVERY DAY Orlean Alan HERO, FNP  Active Spouse/Significant Other  Multiple Vitamins-Minerals (PRESERVISION AREDS) CAPS [id number] Yes Take 1 capsule by mouth 2 (two) times daily. [provider]  Active Spouse/Significant Other  OHTUVAYRE  3 MG/2.5ML SUSP [id number] Yes Inhale 3 mg into the lungs. [provider]  Active   omeprazole  (PRILOSEC) 40 MG capsule [id number]  Take 1 capsule (40 mg total) by mouth daily. Orlean Alan HERO, FNP  Active   OXYGEN  [id number] Yes Inhale 2-3 L into the lungs See admin instructions. Used as needed throughout the day and continuous at bedtime [provider]  Active Spouse/Significant Other  pantoprazole  (PROTONIX ) 40 MG tablet [id number] Yes  [provider]  Active   SUPER B COMPLEX/C PO [id number] Yes Take 1 tablet by mouth daily. [provider]  Active Spouse/Significant Other  torsemide  (DEMADEX ) 20 MG tablet [id number]  Take 20 mg by mouth daily.  Patient taking differently: Take 40 mg by mouth 2 (two) times daily.   [provider]  Active Spouse/Significant Other           Med Note JANEAN, JON DEL   Tue Oct 02, 2023  3:51 PM) 2 tablets daily            Recommendation:   PCP Follow-up Continue Current Plan of Care Hematology to assist with plan of  Follow Up Plan:   Telephone follow-up pending  Elliot Meldrum L. Ramonita, RN, BSN, CCM [place]  [facility], [facility] RN Care Manager Direct Dial: [phone]  Fax: [phone]

## 2024-01-15 DIAGNOSIS — Z79899 Other long term (current) drug therapy: Secondary | ICD-10-CM | POA: Diagnosis not present

## 2024-01-15 DIAGNOSIS — M1A09X0 Idiopathic chronic gout, multiple sites, without tophus (tophi): Secondary | ICD-10-CM | POA: Diagnosis not present

## 2024-01-15 DIAGNOSIS — M1A39X0 Chronic gout due to renal impairment, multiple sites, without tophus (tophi): Secondary | ICD-10-CM | POA: Diagnosis not present

## 2024-01-15 DIAGNOSIS — N186 End stage renal disease: Secondary | ICD-10-CM | POA: Diagnosis not present

## 2024-01-15 DIAGNOSIS — M150 Primary generalized (osteo)arthritis: Secondary | ICD-10-CM | POA: Diagnosis not present

## 2024-01-16 ENCOUNTER — Other Ambulatory Visit

## 2024-01-16 ENCOUNTER — Telehealth: Payer: Self-pay | Admitting: Cardiovascular Disease

## 2024-01-16 DIAGNOSIS — R5383 Other fatigue: Secondary | ICD-10-CM

## 2024-01-16 DIAGNOSIS — E782 Mixed hyperlipidemia: Secondary | ICD-10-CM | POA: Diagnosis not present

## 2024-01-16 DIAGNOSIS — E039 Hypothyroidism, unspecified: Secondary | ICD-10-CM

## 2024-01-16 DIAGNOSIS — N184 Chronic kidney disease, stage 4 (severe): Secondary | ICD-10-CM | POA: Diagnosis not present

## 2024-01-16 DIAGNOSIS — B351 Tinea unguium: Secondary | ICD-10-CM | POA: Diagnosis not present

## 2024-01-16 DIAGNOSIS — E1122 Type 2 diabetes mellitus with diabetic chronic kidney disease: Secondary | ICD-10-CM | POA: Diagnosis not present

## 2024-01-16 DIAGNOSIS — M79671 Pain in right foot: Secondary | ICD-10-CM | POA: Diagnosis not present

## 2024-01-16 DIAGNOSIS — M79672 Pain in left foot: Secondary | ICD-10-CM | POA: Diagnosis not present

## 2024-01-16 NOTE — Telephone Encounter (Signed)
 Patient and wife were in office for labs and stopped to ask about patient's Repatha PA.   Can you please call and let them know a status update?

## 2024-01-17 ENCOUNTER — Other Ambulatory Visit (INDEPENDENT_AMBULATORY_CARE_PROVIDER_SITE_OTHER): Payer: Self-pay | Admitting: Vascular Surgery

## 2024-01-17 DIAGNOSIS — N186 End stage renal disease: Secondary | ICD-10-CM

## 2024-01-17 LAB — CMP14+EGFR
ALT: 9 IU/L (ref 0–44)
AST: 14 IU/L (ref 0–40)
Albumin: 3.7 g/dL — ABNORMAL LOW (ref 3.8–4.8)
Alkaline Phosphatase: 98 IU/L (ref 47–123)
BUN/Creatinine Ratio: 10 (ref 10–24)
BUN: 40 mg/dL — ABNORMAL HIGH (ref 8–27)
Bilirubin Total: 0.4 mg/dL (ref 0.0–1.2)
CO2: 22 mmol/L (ref 20–29)
Calcium: 9.2 mg/dL (ref 8.6–10.2)
Chloride: 101 mmol/L (ref 96–106)
Creatinine, Ser: 4.1 mg/dL — ABNORMAL HIGH (ref 0.76–1.27)
Globulin, Total: 3.1 g/dL (ref 1.5–4.5)
Glucose: 107 mg/dL — ABNORMAL HIGH (ref 70–99)
Potassium: 4.7 mmol/L (ref 3.5–5.2)
Sodium: 143 mmol/L (ref 134–144)
Total Protein: 6.8 g/dL (ref 6.0–8.5)
eGFR: 14 mL/min/1.73 — ABNORMAL LOW (ref >59)

## 2024-01-17 LAB — CBC WITH DIFFERENTIAL/PLATELET
Basophils Absolute: 0 x10E3/uL (ref 0.0–0.2)
Basos: 0 %
EOS (ABSOLUTE): 0.1 x10E3/uL (ref 0.0–0.4)
Eos: 1 %
Hematocrit: 31.7 % — ABNORMAL LOW (ref 37.5–51.0)
Hemoglobin: 10.3 g/dL — ABNORMAL LOW (ref 13.0–17.7)
Immature Grans (Abs): 0 x10E3/uL (ref 0.0–0.1)
Immature Granulocytes: 0 %
Lymphocytes Absolute: 1.5 x10E3/uL (ref 0.7–3.1)
Lymphs: 26 %
MCH: 32.7 pg (ref 26.6–33.0)
MCHC: 32.5 g/dL (ref 31.5–35.7)
MCV: 101 fL — ABNORMAL HIGH (ref 79–97)
Monocytes Absolute: 0.6 x10E3/uL (ref 0.1–0.9)
Monocytes: 10 %
Neutrophils Absolute: 3.6 x10E3/uL (ref 1.4–7.0)
Neutrophils: 63 %
RBC: 3.15 x10E6/uL — ABNORMAL LOW (ref 4.14–5.80)
RDW: 13.3 % (ref 11.6–15.4)
WBC: 5.8 x10E3/uL (ref 3.4–10.8)

## 2024-01-17 LAB — LIPID PANEL
Chol/HDL Ratio: 1.7 ratio (ref 0.0–5.0)
Cholesterol, Total: 68 mg/dL — ABNORMAL LOW (ref 100–199)
HDL: 40 mg/dL (ref >39)
LDL Chol Calc (NIH): 14 mg/dL (ref 0–99)
Triglycerides: 55 mg/dL (ref 0–149)
VLDL Cholesterol Cal: 14 mg/dL (ref 5–40)

## 2024-01-17 LAB — TSH: TSH: 4.53 u[IU]/mL — ABNORMAL HIGH (ref 0.450–4.500)

## 2024-01-17 LAB — HEMOGLOBIN A1C
Est. average glucose Bld gHb Est-mCnc: 100 mg/dL
Hgb A1c MFr Bld: 5.1 % (ref 4.8–5.6)

## 2024-01-17 NOTE — Telephone Encounter (Signed)
 I'm 99% sure it was approved, I need to check up front to see if it in the box for [facility] to come pick up since its not been scanned into the chart yet .

## 2024-01-19 ENCOUNTER — Ambulatory Visit: Payer: Self-pay | Admitting: Family

## 2024-01-20 NOTE — Progress Notes (Deleted)
 MRN : [id number]  [name] [name] is a 78 y.o. (1945-09-02) male who presents with chief complaint of check access.  History of Present Illness:   The patient returns to the office for followup of their dialysis access.   The patient reports the function of the access has been stable. Patient denies difficulty with cannulation. The patient denies increased bleeding time after removing the needles. The patient denies hand pain or other symptoms consistent with steal phenomena.  No significant arm swelling.  The patient denies any complaints from the dialysis center or their nephrologist.  The patient denies redness or swelling at the access site. The patient denies fever or chills at home or while on dialysis.  No recent shortening of the patient's walking distance or new symptoms consistent with claudication.  No history of rest pain symptoms. No new ulcers or wounds of the lower extremities have occurred.  The patient denies amaurosis fugax or recent TIA symptoms. There are no recent neurological changes noted. There is no history of DVT, PE or superficial thrombophlebitis. No recent episodes of angina or shortness of breath documented.   Duplex ultrasound of the AV access shows a patent access.  The previously noted stenosis is not significantly changed compared to last study.  Flow volume today is *** cc/min (previous flow volume was *** cc/min)    No outpatient medications have been marked as taking for the 01/21/24 encounter (Appointment) with Jama, Cordella MATSU, MD.    Past Medical History:  Diagnosis Date   Accidental cut, puncture, perforation, or hemorrhage during heart catheterization 12/27/2012   Acute kidney injury superimposed on stage 4 chronic kidney disease (HCC) 07/27/2020   Acute on chronic combined systolic and diastolic CHF (congestive heart failure) (HCC) 04/25/2023   Acute on chronic respiratory failure with hypoxia (HCC)  04/22/2023   Adopted    AKI (acute kidney injury) 02/05/2023   ANA positive 07/12/2015   Aortic atherosclerosis    Aortic stenosis, severe    a.) s/p TAVR on 08/01/2016: 29 mm CoreValve Evolut placed   Arthritis    Bacteremia due to Streptococcus 04/24/2023   Basal cell carcinoma 01/04/2010   Right sup. post. helix. Excised 03/02/2010   BCC (basal cell carcinoma of skin) 11/24/2020   R neck below the ear, EDC   BPH (benign prostatic hyperplasia)    CAD (coronary artery disease) 12/26/2012   a.) LHC 12/26/2012: 50% mLAD, 80% mLCx, 100% pRCA --> CVTS at [facility] --> decided again revascularization due to insig LAD disease and the fact that RCA lesion was chronic   CAP (community acquired pneumonia) 07/26/2020   Chronic respiratory failure with hypoxia (HCC)    Claudication of both lower extremities    Complete tear of right rotator cuff 11/15/2015   Complex tear of lateral meniscus of left knee as current injury 12/06/2020   Complex tear of medial meniscus of left knee as current injury 10/18/2020   COPD (chronic obstructive pulmonary disease) (HCC)    Diarrhea 02/04/2023   Diet-controlled type 2 diabetes mellitus (HCC)    Diverticulosis    Dyspnea    Electrolyte abnormality 02/04/2023   ESRD (end stage renal disease) (HCC)    GERD (gastroesophageal reflux disease)    Gout    Heart murmur    HFrEF (heart failure with reduced ejection fraction) (HCC)    a.) s/p TAVR on 08/01/2016: 29 mm  CoreValve Evolut placed   History of bilateral cataract extraction    History of hiatal hernia    HLD (hyperlipidemia)    Hyperkalemia 07/30/2020   Hypertension    Hypomagnesemia 02/05/2023   Hypothyroidism    IDA (iron deficiency anemia)    Migraines    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   a.) TTE 04/24/2023: moderate (MPG 7.5 mmHG)   OSA on CPAP    Pneumonia 07/2020   Pulmonary hypertension (HCC)    a.) TTE 08/17/2022: mild pHTN per echo report; b.) TTE 04/24/2023: RVSP 39.1   Recurrent  nephrolithiasis 12/27/2012   Rotator cuff tendinitis, right 11/15/2015   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 MOHS.   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/[place])     Past Surgical History:  Procedure Laterality Date   AORTIC VALVE REPLACEMENT N/A 08/01/2016   29 mm CoreValve Evolut; Location: [facility]; Surgeon: Reyes Fruits, MD   AV FISTULA PLACEMENT Right 06/27/2023   Procedure: ARTERIOVENOUS (AV) FISTULA CREATION (BRACHIALCEPHALIC);  Surgeon: Jama Cordella MATSU, MD;  Location: [facility];  Service: Vascular;  Laterality: Right;   CARDIAC CATHETERIZATION N/A 12/26/2012   2v CAD; retained pigtail in myocardium --> transferred to [facility]; Location: [facility]; Surgeon: Vita Bathe, MD   CATARACT EXTRACTION W/PHACO Left 10/03/2022   Procedure: CATARACT EXTRACTION PHACO AND INTRAOCULAR LENS PLACEMENT (IOC) LEFT DIABETIC 8.22 00:55.5;  Surgeon: Jaye Fallow, MD;  Location: [facility];  Service: Ophthalmology;  Laterality: Left;  sleep apnea   CATARACT EXTRACTION W/PHACO Right 10/17/2022   Procedure: CATARACT EXTRACTION PHACO AND INTRAOCULAR LENS PLACEMENT (IOC) RIGHT DIABETIC;  Surgeon: Jaye Fallow, MD;  Location: [facility];  Service: Ophthalmology;  Laterality: Right;  4.79 0:37.4   COLONOSCOPY     COLONOSCOPY N/A 04/28/2023   Procedure: COLONOSCOPY;  Surgeon: Toledo, Ladell POUR, MD;  Location: [facility];  Service: Gastroenterology;  Laterality: N/A;   DIALYSIS/PERMA CATHETER INSERTION N/A 08/29/2023   Procedure:  DIALYSIS/PERMA CATHETER INSERTION;  Surgeon: Jama Cordella MATSU, MD;  Location: [facility];  Service: Cardiovascular;  Laterality: N/A;   DIALYSIS/PERMA CATHETER REMOVAL N/A 11/12/2023   Procedure: DIALYSIS/PERMA CATHETER REMOVAL;  Surgeon: Marea Selinda RAMAN, MD;  Location: [facility];  Service: Cardiovascular;  Laterality: N/A;   KNEE ARTHROSCOPY WITH MEDIAL MENISECTOMY Left 12/02/2020   Procedure: KNEE ARTHROSCOPY WITH DEBRIDEMENT AND PARTIAL MEDIAL AND LATERAL MENISECTOMY;  Surgeon: Edie Norleen PARAS, MD;  Location: [facility];  Service: Orthopedics;  Laterality: Left;   LITHOTRIPSY     PERCUTANEOUS REMOVAL INTRA-AORTIC BALLOON CATH N/A 12/26/2012   Procedure: PERCUTANEOUS REMOVAL INTRA-AORTIC BALLOON CATH; Surgeon: Lang Lannie Salter, MD; Location: [facility]; Service: Cardiothoracic   POLYPECTOMY  04/28/2023   Procedure: POLYPECTOMY;  Surgeon: Aundria, Ladell POUR, MD;  Location: [facility];  Service: Gastroenterology;;   RIGHT HEART CATH Right 07/13/2016   Location: [facility]; Surgeon: Ozell Estelle, MD   TEE WITHOUT CARDIOVERSION Right 04/26/2023   Procedure: TRANSESOPHAGEAL ECHOCARDIOGRAM (TEE);  Surgeon: Bathe Denyse LABOR, MD;  Location: [facility];  Service: Cardiovascular;  Laterality: Right;   TRANSESOPHAGEAL ECHOCARDIOGRAM N/A 12/26/2012   Procedure: TRANSESOPHAGEAL ECHOCARDIOGRAPHY; Surgeon: Lang Lannie Salter, MD; Location: [facility]; Service: Cardiothoracic   UMBILICAL HERNIA REPAIR N/A 02/13/2014   Procedure: LAPAROSCOPIC UMBILICAL HERNIA REPAIR; Surgeon: Shanda ONEIDA Salter, MD; Location: [facility]; Service: General Surgery    Social History Social History   Tobacco Use   Smoking status: Former    Current packs/day: 0.00    Average packs/day: 2.0 packs/day for 54.0 years (108.0 ttl pk-yrs)    Types: Cigarettes    Start date: 78    Quit date: 2010    Years since quitting: 15.7   Smokeless tobacco: Former    Types: Chew    Quit date: 05/16/2004   Vaping Use   Vaping status: Never Used  Substance Use Topics   Alcohol use: Yes    Comment: rare beer   Drug use: Never    Family History Family History  Adopted: Yes  Problem Relation Age of Onset   Varicose Veins Daughter    Obesity Daughter    Hyperlipidemia Daughter    Diabetes Daughter    COPD Daughter    Depression Daughter    Asthma Daughter    Arthritis Son    Diabetes Son    Hypertension Son     Allergies  Allergen Reactions   Nsaids Other (See Comments)    Avoid due to kidney disease    Statins Other (See Comments)    Myalgias   Nexletol [Bempedoic Acid] Diarrhea    fatigue   Pedi-Pre Tape Spray [Wound Dressing Adhesive]    Silver Other (See Comments)   Tegaderm High Gelling Alginate [Wound Dressings] Rash     REVIEW OF SYSTEMS (Negative unless checked)  Constitutional: [] Weight loss  [] Fever  [] Chills Cardiac: [] Chest pain   [] Chest pressure   [] Palpitations   [] Shortness of breath when laying flat   [] Shortness of breath with exertion. Vascular:  [] Pain in legs with walking   [] Pain in legs at rest  [] History of DVT   [] Phlebitis   [] Swelling in legs   [] Varicose veins   [] Non-healing ulcers Pulmonary:   [] Uses home oxygen    [] Productive cough   [] Hemoptysis   [] Wheeze  [] COPD   [] Asthma Neurologic:  [] Dizziness   [] Seizures   [] History of stroke   [] History of TIA  [] Aphasia   [] Vissual changes   [] Weakness or numbness in arm   [] Weakness or numbness in leg Musculoskeletal:   [] Joint swelling   [] Joint pain   [] Low back pain Hematologic:  [] Easy bruising  [] Easy bleeding   [] Hypercoagulable state   [] Anemic Gastrointestinal:  [] Diarrhea   [] Vomiting  [] Gastroesophageal reflux/heartburn   [] Difficulty swallowing. Genitourinary:  [x] Chronic kidney disease   [] Difficult urination  [] Frequent urination   [] Blood in urine Skin:  [] Rashes   [] Ulcers  Psychological:  [] History of anxiety   []  History of major depression.  Physical Examination  There were  no vitals filed for this visit. There is no height or weight on file to calculate BMI. Gen: WD/WN, NAD Head: [place]/AT, No temporalis wasting.  Ear/Nose/Throat: Hearing grossly intact, nares w/o erythema or drainage Eyes: PER, EOMI, sclera nonicteric.  Neck: Supple, no gross masses or lesions.  No JVD.  Pulmonary:  Good air movement, no audible wheezing, no use of accessory muscles.  Cardiac: RRR, precordium non-hyperdynamic. Vascular:   *** Vessel Right Left  Radial Palpable Palpable  Brachial Palpable Palpable  Gastrointestinal: soft, non-distended. No guarding/no peritoneal signs.  Musculoskeletal: M/S 5/5 throughout.  No deformity.  Neurologic: CN 2-12 intact. Pain and light touch intact in extremities.  Symmetrical.  Speech is fluent. Motor exam as listed above. Psychiatric: Judgment intact, Mood & affect appropriate for pt's clinical situation. Dermatologic: No rashes or ulcers noted.  No changes consistent with cellulitis.   CBC Lab Results  Component Value Date   WBC 5.8 01/16/2024   HGB 10.3 (L) 01/16/2024   HCT 31.7 (L) 01/16/2024   MCV 101 (H) 01/16/2024   PLT CANCELED 01/16/2024    BMET    Component Value Date/Time   NA 143 01/16/2024 1020   NA 138 07/27/2014 1247   K 4.7 01/16/2024 1020   K 4.2 07/27/2014 1247   CL 101 01/16/2024 1020   CL 105 07/27/2014 1247   CO2 22 01/16/2024 1020   CO2 26 07/27/2014 1247   GLUCOSE 107 (H) 01/16/2024 1020   GLUCOSE 100 (H) 12/25/2023 1237   GLUCOSE 135 (H) 07/27/2014 1247   BUN 40 (H) 01/16/2024 1020   BUN 28 (H) 07/27/2014 1247   CREATININE 4.10 (H) 01/16/2024 1020   CREATININE 1.64 (H) 07/27/2014 1247   CALCIUM 9.2 01/16/2024 1020   CALCIUM 8.7 (L) 07/27/2014 1247   GFRNONAA 17 (L) 12/25/2023 1237   GFRNONAA 42 (L) 07/27/2014 1247   GFRAA 23 (L) 07/24/2016 1919   GFRAA 48 (L) 07/27/2014 1247   Estimated Creatinine Clearance: 14.5 mL/min (A) (by C-G formula based on SCr of 4.1 mg/dL (H)).  COAG Lab Results   Component Value Date   INR 1.1 04/21/2023   INR 1.0 07/27/2014    Radiology No results found.   Assessment/Plan There are no diagnoses linked to this encounter.   Cordella Shawl, MD  01/20/2024 4:19 PM

## 2024-01-21 ENCOUNTER — Ambulatory Visit (INDEPENDENT_AMBULATORY_CARE_PROVIDER_SITE_OTHER): Admitting: Vascular Surgery

## 2024-01-21 ENCOUNTER — Ambulatory Visit (INDEPENDENT_AMBULATORY_CARE_PROVIDER_SITE_OTHER)

## 2024-01-21 DIAGNOSIS — J439 Emphysema, unspecified: Secondary | ICD-10-CM

## 2024-01-21 DIAGNOSIS — I10 Essential (primary) hypertension: Secondary | ICD-10-CM

## 2024-01-21 DIAGNOSIS — N186 End stage renal disease: Secondary | ICD-10-CM | POA: Diagnosis not present

## 2024-01-21 DIAGNOSIS — E782 Mixed hyperlipidemia: Secondary | ICD-10-CM

## 2024-01-21 DIAGNOSIS — I25118 Atherosclerotic heart disease of native coronary artery with other forms of angina pectoris: Secondary | ICD-10-CM

## 2024-01-22 ENCOUNTER — Ambulatory Visit (INDEPENDENT_AMBULATORY_CARE_PROVIDER_SITE_OTHER): Admitting: Family

## 2024-01-22 ENCOUNTER — Encounter: Payer: Self-pay | Admitting: Family

## 2024-01-22 VITALS — BP 122/56 | HR 82 | Ht 65.0 in | Wt 187.6 lb

## 2024-01-22 DIAGNOSIS — D508 Other iron deficiency anemias: Secondary | ICD-10-CM

## 2024-01-22 DIAGNOSIS — J431 Panlobular emphysema: Secondary | ICD-10-CM

## 2024-01-22 DIAGNOSIS — H353230 Exudative age-related macular degeneration, bilateral, stage unspecified: Secondary | ICD-10-CM | POA: Insufficient documentation

## 2024-01-22 DIAGNOSIS — I10 Essential (primary) hypertension: Secondary | ICD-10-CM | POA: Diagnosis not present

## 2024-01-22 DIAGNOSIS — E1122 Type 2 diabetes mellitus with diabetic chronic kidney disease: Secondary | ICD-10-CM | POA: Diagnosis not present

## 2024-01-22 DIAGNOSIS — Z992 Dependence on renal dialysis: Secondary | ICD-10-CM | POA: Diagnosis not present

## 2024-01-22 DIAGNOSIS — J9611 Chronic respiratory failure with hypoxia: Secondary | ICD-10-CM

## 2024-01-22 DIAGNOSIS — N186 End stage renal disease: Secondary | ICD-10-CM

## 2024-01-22 DIAGNOSIS — R531 Weakness: Secondary | ICD-10-CM

## 2024-01-22 MED ORDER — OMEPRAZOLE 40 MG PO CPDR: 40.0000 mg | DELAYED_RELEASE_CAPSULE | Freq: Every day | ORAL | 3 refills | Status: DC

## 2024-01-22 NOTE — Progress Notes (Addendum)
 Established Patient Office Visit  Subjective:  Patient ID: [name] [name], male    DOB: [birth year]  Age: 78 y.o. MRN: [id number]  Chief Complaint  Patient presents with   Follow-up    4 month follow up    Patient is here today for his 3 months follow up.  He has been feeling poorly since last appointment.   He does have additional concerns to discuss today.  He has been getting short of breath with very little activity, says even about 50 feet.  O2 sat when he first got into the office today was very low, increased with rest.   He also needs a new pair of diabetic shoes, his previous ones no longer fit appropriately.  He does have calluses and poor circulation in his lower extremities, as well as decreased sensation in bilateral feet.  This is due to his diabetes, and is stable from previous exams.   He needs a transport wheelchair as well, he does have help to manage this around the house, but he needs this to help him perform his adl's.  He is currently unable to manage these without assistive devices.  Due to his weakness, he will not be able to manage these with a cane or a walker, as he cannot stand for extended periods of time and rapidly becomes short of breath.  He would not be able to manage a manual wheelchair at home due to space limitations and difficulty in operating for his caregiver, who has limitations of her own that would make use of a standard manual wheelchair impossible.  He is able to ambulate for 15 feet without assistance, but after that becomes too short of breath to continue and has to rest.   Labs were done prior to appointment, will discuss in detail today.  He needs refills.   I have reviewed his active problem list, medication list, allergies, notes from last encounter, lab results for his appointment today.      No other concerns at this time.   Past Medical History:  Diagnosis Date   Accidental cut, puncture, perforation, or hemorrhage  during heart catheterization 12/27/2012   Acute kidney injury superimposed on stage 4 chronic kidney disease (HCC) 07/27/2020   Acute on chronic combined systolic and diastolic CHF (congestive heart failure) (HCC) 04/25/2023   Acute on chronic respiratory failure with hypoxia (HCC) 04/22/2023   Adopted    AKI (acute kidney injury) 02/05/2023   ANA positive 07/12/2015   Aortic atherosclerosis    Aortic stenosis, severe    a.) s/p TAVR on 08/01/2016: 29 mm CoreValve Evolut placed   Arthritis    Bacteremia due to Streptococcus 04/24/2023   Basal cell carcinoma 01/04/2010   Right sup. post. helix. Excised 03/02/2010   BCC (basal cell carcinoma of skin) 11/24/2020   R neck below the ear, EDC   BPH (benign prostatic hyperplasia)    CAD (coronary artery disease) 12/26/2012   a.) LHC 12/26/2012: 50% mLAD, 80% mLCx, 100% pRCA --> [facility] at [facility] --> decided again revascularization due to insig LAD disease and the fact that RCA lesion was chronic   CAP (community acquired pneumonia) 07/26/2020   Chronic respiratory failure with hypoxia (HCC)    Claudication of both lower extremities    Complete tear of right rotator cuff 11/15/2015   Complex tear of lateral meniscus of left knee as current injury 12/06/2020   Complex tear of medial meniscus of left knee as current injury 10/18/2020   COPD (  chronic obstructive pulmonary disease) (HCC)    Diarrhea 02/04/2023   Diet-controlled type 2 diabetes mellitus (HCC)    Diverticulosis    Dyspnea    Electrolyte abnormality 02/04/2023   ESRD (end stage renal disease) (HCC)    GERD (gastroesophageal reflux disease)    Gout    Heart murmur    HFrEF (heart failure with reduced ejection fraction) (HCC)    a.) s/p TAVR on 08/01/2016: 29 mm CoreValve Evolut placed   History of bilateral cataract extraction    History of hiatal hernia    HLD (hyperlipidemia)    Hyperkalemia 07/30/2020   Hypertension    Hypomagnesemia 02/05/2023   Hypothyroidism    IDA (iron  deficiency anemia)    Migraines    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   a.) TTE 04/24/2023: moderate (MPG 7.5 mmHG)   OSA on CPAP    Pneumonia 07/2020   Pulmonary hypertension (HCC)    a.) TTE 08/17/2022: mild pHTN per echo report; b.) TTE 04/24/2023: RVSP 39.1   Recurrent nephrolithiasis 12/27/2012   Rotator cuff tendinitis, right 11/15/2015   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 MOHS.   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/[place])     Past Surgical History:  Procedure Laterality Date   AORTIC VALVE REPLACEMENT N/A 08/01/2016   29 mm CoreValve Evolut; Location: [facility]; Surgeon: Reyes Fruits, MD   AV FISTULA PLACEMENT Right 06/27/2023   Procedure: ARTERIOVENOUS (AV) FISTULA CREATION (BRACHIALCEPHALIC);  Surgeon: Jama Cordella MATSU, MD;  Location: [facility];  Service: Vascular;  Laterality: Right;   CARDIAC CATHETERIZATION N/A 12/26/2012   2v CAD; retained pigtail in myocardium --> transferred to [facility]; Location: [facility]; Surgeon: Vita Bathe, MD   CATARACT EXTRACTION W/PHACO Left 10/03/2022   Procedure: CATARACT EXTRACTION PHACO AND INTRAOCULAR LENS PLACEMENT (IOC) LEFT DIABETIC 8.22 00:55.5;  Surgeon: Jaye Fallow, MD;  Location: [facility];  Service: Ophthalmology;  Laterality: Left;  sleep apnea   CATARACT EXTRACTION W/PHACO Right 10/17/2022   Procedure: CATARACT EXTRACTION PHACO AND INTRAOCULAR LENS PLACEMENT (IOC) RIGHT DIABETIC;  Surgeon: Jaye Fallow, MD;  Location: [facility];  Service: Ophthalmology;   Laterality: Right;  4.79 0:37.4   COLONOSCOPY     COLONOSCOPY N/A 04/28/2023   Procedure: COLONOSCOPY;  Surgeon: Toledo, Ladell POUR, MD;  Location: [facility];  Service: Gastroenterology;  Laterality: N/A;   DIALYSIS/PERMA CATHETER INSERTION N/A 08/29/2023   Procedure: DIALYSIS/PERMA CATHETER INSERTION;  Surgeon: Jama Cordella MATSU, MD;  Location: [facility];  Service: Cardiovascular;  Laterality: N/A;   DIALYSIS/PERMA CATHETER REMOVAL N/A 11/12/2023   Procedure: DIALYSIS/PERMA CATHETER REMOVAL;  Surgeon: Marea Selinda RAMAN, MD;  Location: [facility];  Service: Cardiovascular;  Laterality: N/A;   KNEE ARTHROSCOPY WITH MEDIAL MENISECTOMY Left 12/02/2020   Procedure: KNEE ARTHROSCOPY WITH DEBRIDEMENT AND PARTIAL MEDIAL AND LATERAL MENISECTOMY;  Surgeon: Edie Norleen PARAS, MD;  Location: [facility];  Service: Orthopedics;  Laterality: Left;   LITHOTRIPSY     PERCUTANEOUS REMOVAL INTRA-AORTIC BALLOON CATH N/A 12/26/2012   Procedure: PERCUTANEOUS REMOVAL  INTRA-AORTIC BALLOON CATH; Surgeon: Lang Lannie Salter, MD; Location: [facility]; Service: Cardiothoracic   POLYPECTOMY  04/28/2023   Procedure: POLYPECTOMY;  Surgeon: Aundria, Ladell POUR, MD;  Location: [facility];  Service: Gastroenterology;;   RIGHT HEART CATH Right 07/13/2016   Location: [facility]; Surgeon: Ozell Estelle, MD   TEE WITHOUT CARDIOVERSION Right 04/26/2023   Procedure: TRANSESOPHAGEAL ECHOCARDIOGRAM (TEE);  Surgeon: Fernand Denyse LABOR, MD;  Location: [facility];  Service: Cardiovascular;  Laterality: Right;   TRANSESOPHAGEAL ECHOCARDIOGRAM N/A 12/26/2012   Procedure: TRANSESOPHAGEAL ECHOCARDIOGRAPHY; Surgeon: Lang Lannie Salter, MD; Location: [facility]; Service: Cardiothoracic   UMBILICAL HERNIA REPAIR N/A 02/13/2014   Procedure: LAPAROSCOPIC UMBILICAL HERNIA REPAIR; Surgeon: Shanda ONEIDA Salter, MD; Location: [facility]; Service: General Surgery    Social History   Socioeconomic History   Marital status:  Married    Spouse name: Ronelle Smallman   Number of children: 2   Years of education: Not on file   Highest education level: Not on file  Occupational History   Occupation: retired  Tobacco Use   Smoking status: Former    Current packs/day: 0.00    Average packs/day: 2.0 packs/day for 54.0 years (108.0 ttl pk-yrs)    Types: Cigarettes    Start date: 9    Quit date: 2010    Years since quitting: 15.7   Smokeless tobacco: Former    Types: Chew    Quit date: 05/16/2004  Vaping Use   Vaping status: Never Used  Substance and Sexual Activity   Alcohol use: Yes    Comment: rare beer   Drug use: Never   Sexual activity: Not on file  Other Topics Concern   Not on file  Social History Narrative   Not on file   Social Drivers of Health   Financial Resource Strain: Low Risk  (08/27/2023)   Overall Financial Resource Strain (CARDIA)    Difficulty of Paying Living Expenses: Not very hard  Recent Concern: Financial Resource Strain - High Risk (07/24/2023)   Received from [facility]   Overall Financial Resource Strain (CARDIA)    Difficulty of Paying Living Expenses: Hard  Food Insecurity: No Food Insecurity (08/28/2023)   Hunger Vital Sign    Worried About Running Out of Food in the Last Year: Never true    Ran Out of Food in the Last Year: Never true  Recent Concern: Food Insecurity - Food Insecurity Present (07/12/2023)   Received from [facility]   Hunger Vital Sign    Worried About Running Out of Food in the Last Year: Sometimes true    Ran Out of Food in the Last Year: Sometimes true  Transportation Needs: No Transportation Needs (08/28/2023)   PRAPARE - Administrator, Civil Service (Medical): No    Lack of Transportation (Non-Medical): No  Physical Activity: Inactive (07/18/2023)   Received from [facility]   Exercise Vital Sign    On average, how many days per week do you engage in moderate to strenuous  exercise (like a brisk walk)?: 0 days    On average, how many minutes do you engage in exercise at this level?: 0 min  Stress: No Stress Concern Present (08/27/2023)   [employer] of Occupational Health - Occupational Stress Questionnaire    Feeling of Stress : Only a little  Recent Concern: Stress - Stress Concern Present (07/18/2023)   Received from [facility] of Occupational Health -  Occupational Stress Questionnaire    Feeling of Stress : To some extent  Social Connections: Socially Integrated (08/28/2023)   Social Connection and Isolation Panel    Frequency of Communication with Friends and Family: More than three times a week    Frequency of Social Gatherings with Friends and Family: More than three times a week    Attends Religious Services: More than 4 times per year    Active Member of [employer] or Organizations: Yes    Attends Banker Meetings: Never    Marital Status: Married  Catering Manager Violence: Not At Risk (08/28/2023)   Humiliation, Afraid, Rape, and Kick questionnaire    Fear of Current or Ex-Partner: No    Emotionally Abused: No    Physically Abused: No    Sexually Abused: No    Family History  Adopted: Yes  Problem Relation Age of Onset   Varicose Veins Daughter    Obesity Daughter    Hyperlipidemia Daughter    Diabetes Daughter    COPD Daughter    Depression Daughter    Asthma Daughter    Arthritis Son    Diabetes Son    Hypertension Son     Allergies  Allergen Reactions   Nsaids Other (See Comments)    Avoid due to kidney disease    Statins Other (See Comments)    Myalgias   Nexletol [Bempedoic Acid] Diarrhea    fatigue   Pedi-Pre Tape Spray [Wound Dressing Adhesive]    Silver Other (See Comments)   Tegaderm High Gelling Alginate [Wound Dressings] Rash    Review of Systems  Constitutional:  Positive for malaise/fatigue.  Respiratory:  Positive for shortness of breath and wheezing.   All  other systems reviewed and are negative.      Objective:   BP (!) 122/56   Pulse 82   Ht 5' 5 (1.651 m)   Wt 187 lb 9.6 oz (85.1 kg)   SpO2 (!) 78%   BMI 31.22 kg/m   Vitals:   01/22/24 1056  BP: (!) 122/56  Pulse: 82  Height: 5' 5 (1.651 m)  Weight: 187 lb 9.6 oz (85.1 kg)  SpO2: (!) 78%  BMI (Calculated): 31.22    Physical Exam Vitals and nursing note reviewed.  Constitutional:      Appearance: Normal appearance. He is obese.  Eyes:     Pupils: Pupils are equal, round, and reactive to light.  Cardiovascular:     Rate and Rhythm: Normal rate and regular rhythm.     Pulses: Normal pulses.     Heart sounds: Normal heart sounds.  Pulmonary:     Effort: Pulmonary effort is normal.     Breath sounds: Normal breath sounds.  Neurological:     General: No focal deficit present.     Mental Status: He is alert and oriented to person, place, and time.     Sensory: Sensory deficit (decreased sensation bilateral feet) present.  Psychiatric:        Mood and Affect: Mood normal.        Behavior: Behavior normal.      No results found for any visits on 01/22/24.  Recent Results (from the past 2160 hours)  POCT CBG (Fasting - Glucose)     Status: Abnormal   Collection Time: 11/22/23 11:40 AM  Result Value Ref Range   Glucose Fasting, POC 168 (A) 70 - 99 mg/dL  [id number]     Status: Abnormal   Collection Time: 11/22/23 12:14  PM  Result Value Ref Range   Glucose 86 70 - 99 mg/dL   BUN 30 (H) 8 - 27 mg/dL   Creatinine, Ser 5.77 (H) 0.76 - 1.27 mg/dL   eGFR 14 (L) >40 fO/fpw/8.26   BUN/Creatinine Ratio 7 (L) 10 - 24   Sodium 138 134 - 144 mmol/L   Potassium 4.4 3.5 - 5.2 mmol/L   Chloride 97 96 - 106 mmol/L   CO2 22 20 - 29 mmol/L   Calcium 9.5 8.6 - 10.2 mg/dL   Total Protein 6.7 6.0 - 8.5 g/dL   Albumin 3.8 3.8 - 4.8 g/dL   Globulin, Total 2.9 1.5 - 4.5 g/dL   Bilirubin Total 0.3 0.0 - 1.2 mg/dL   Alkaline Phosphatase 121 44 - 121 IU/L   AST 16 0 - 40 IU/L    ALT 8 0 - 44 IU/L  Iron, TIBC and Ferritin Panel     Status: Abnormal   Collection Time: 11/22/23 12:14 PM  Result Value Ref Range   Total Iron Binding Capacity 202 (L) 250 - 450 ug/dL   UIBC 859 888 - 656 ug/dL   Iron 62 38 - 830 ug/dL   Iron Saturation 31 15 - 55 %   Ferritin 529 (H) 30 - 400 ng/mL  Iron and TIBC     Status: Abnormal   Collection Time: 12/25/23 12:37 PM  Result Value Ref Range   Iron 74 45 - 182 ug/dL   TIBC 779 (L) 749 - 549 ug/dL   Saturation Ratios 34 17.9 - 39.5 %   UIBC 146 ug/dL    Comment: Performed at [facility], [address] [place], [place] [address]  Ferritin     Status: None   Collection Time: 12/25/23 12:37 PM  Result Value Ref Range   Ferritin 277 24 - 336 ng/mL    Comment: Performed at [facility], [address] [place], [place] [address]  Comprehensive metabolic panel     Status: Abnormal   Collection Time: 12/25/23 12:37 PM  Result Value Ref Range   Sodium 136 135 - 145 mmol/L   Potassium 4.0 3.5 - 5.1 mmol/L   Chloride 99 98 - 111 mmol/L   CO2 26 22 - 32 mmol/L   Glucose, Bld 100 (H) 70 - 99 mg/dL    Comment: Glucose reference range applies only to samples taken after fasting for at least 8 hours.   BUN 29 (H) 8 - 23 mg/dL   Creatinine, Ser 6.46 (H) 0.61 - 1.24 mg/dL   Calcium 9.0 8.9 - 89.6 mg/dL   Total Protein 7.2 6.5 - 8.1 g/dL   Albumin 3.1 (L) 3.5 - 5.0 g/dL   AST 24 15 - 41 U/L   ALT 10 0 - 44 U/L   Alkaline Phosphatase 87 38 - 126 U/L   Total Bilirubin 0.7 0.0 - 1.2 mg/dL   GFR, Estimated 17 (L) >60 mL/min    Comment: (NOTE) Calculated using the CKD-EPI Creatinine Equation (2021)    Anion gap 11 5 - 15    Comment: Performed at [facility], [address] [place], [place] [address]  CBC with Differential/Platelet     Status: Abnormal   Collection Time: 12/25/23 12:37 PM  Result Value Ref Range   WBC 7.5 4.0 - 10.5 K/uL   RBC 3.30 (L) 4.22 - 5.81 MIL/uL   Hemoglobin 10.5 (L) 13.0 -  17.0 g/dL   HCT 67.1 (L) 60.9 - 47.9 %  MCV 99.4 80.0 - 100.0 fL   MCH 31.8 26.0 - 34.0 pg   MCHC 32.0 30.0 - 36.0 g/dL   RDW 84.9 88.4 - 84.4 %   Platelets 239 150 - 400 K/uL   nRBC 0.0 0.0 - 0.2 %   Neutrophils Relative % 60 %   Neutro Abs 4.5 1.7 - 7.7 K/uL   Lymphocytes Relative 26 %   Lymphs Abs 1.9 0.7 - 4.0 K/uL   Monocytes Relative 11 %   Monocytes Absolute 0.8 0.1 - 1.0 K/uL   Eosinophils Relative 2 %   Eosinophils Absolute 0.1 0.0 - 0.5 K/uL   Basophils Relative 0 %   Basophils Absolute 0.0 0.0 - 0.1 K/uL   Immature Granulocytes 1 %   Abs Immature Granulocytes 0.09 (H) 0.00 - 0.07 K/uL    Comment: Performed at [facility], [address] [place], [place] [address]  CBC with Differential/Platelet     Status: Abnormal   Collection Time: 01/16/24 10:20 AM  Result Value Ref Range   WBC 5.8 3.4 - 10.8 x10E3/uL   RBC 3.15 (L) 4.14 - 5.80 x10E6/uL    Comment: Basophilic Stippling. Ovalocytes present. Polychromasia present    Hemoglobin 10.3 (L) 13.0 - 17.7 g/dL   Hematocrit 68.2 (L) 62.4 - 51.0 %   MCV 101 (H) 79 - 97 fL   MCH 32.7 26.6 - 33.0 pg   MCHC 32.5 31.5 - 35.7 g/dL   RDW 86.6 88.3 - 84.5 %   Platelets CANCELED x10E3/uL    Comment: Unable to perform an accurate platelet count due to aggregation of the platelets.  Result canceled by the ancillary.    Neutrophils 63 Not Estab. %    Comment: Occasional metamyelocyte seen on scan.   Lymphs 26 Not Estab. %   Monocytes 10 Not Estab. %   Eos 1 Not Estab. %   Basos 0 Not Estab. %   Neutrophils Absolute 3.6 1.4 - 7.0 x10E3/uL   Lymphocytes Absolute 1.5 0.7 - 3.1 x10E3/uL   Monocytes Absolute 0.6 0.1 - 0.9 x10E3/uL   EOS (ABSOLUTE) 0.1 0.0 - 0.4 x10E3/uL   Basophils Absolute 0.0 0.0 - 0.2 x10E3/uL   Immature Granulocytes 0 Not Estab. %   Immature Grans (Abs) 0.0 0.0 - 0.1 x10E3/uL   Hematology Comments: Note:     Comment: CBC met reflex criteria for review of peripheral smear by medical laboratory  professional. Automated results were confirmed by smear review.   CMP14+EGFR     Status: Abnormal   Collection Time: 01/16/24 10:20 AM  Result Value Ref Range   Glucose 107 (H) 70 - 99 mg/dL   BUN 40 (H) 8 - 27 mg/dL   Creatinine, Ser 5.89 (H) 0.76 - 1.27 mg/dL   eGFR 14 (L) >40 fO/fpw/8.26   BUN/Creatinine Ratio 10 10 - 24   Sodium 143 134 - 144 mmol/L   Potassium 4.7 3.5 - 5.2 mmol/L   Chloride 101 96 - 106 mmol/L   CO2 22 20 - 29 mmol/L   Calcium 9.2 8.6 - 10.2 mg/dL   Total Protein 6.8 6.0 - 8.5 g/dL   Albumin 3.7 (L) 3.8 - 4.8 g/dL   Globulin, Total 3.1 1.5 - 4.5 g/dL   Bilirubin Total 0.4 0.0 - 1.2 mg/dL   Alkaline Phosphatase 98 47 - 123 IU/L    Comment:               **Please note reference interval change**   AST 14 0 -  40 IU/L   ALT 9 0 - 44 IU/L  Hemoglobin A1c     Status: None   Collection Time: 01/16/24 10:20 AM  Result Value Ref Range   Hgb A1c MFr Bld 5.1 4.8 - 5.6 %    Comment:          Prediabetes: 5.7 - 6.4          Diabetes: >6.4          Glycemic control for adults with diabetes: <7.0    Est. average glucose Bld gHb Est-mCnc 100 mg/dL  Lipid panel     Status: Abnormal   Collection Time: 01/16/24 10:20 AM  Result Value Ref Range   Cholesterol, Total 68 (L) 100 - 199 mg/dL   Triglycerides 55 0 - 149 mg/dL   HDL 40 >60 mg/dL   VLDL Cholesterol Cal 14 5 - 40 mg/dL   LDL Chol Calc (NIH) 14 0 - 99 mg/dL   Chol/HDL Ratio 1.7 0.0 - 5.0 ratio    Comment:                                   T. 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  He is well controlled with current therapy.   Will defer to them for further changes to plan of care.  Severe obesity (BMI 35.0-39.9) with comorbidity (HCC) Continue current meds.  Will adjust as needed based on results.  The patient is asked to make an attempt to improve diet and exercise patterns to aid in medical management of this problem. Addressed importance of increasing and maintaining water  intake.   Type 2 diabetes mellitus with chronic kidney disease on chronic dialysis, without long-term current use of insulin  (HCC) Continue current diabetes POC, as patient has been well controlled on current regimen.  Will adjust meds if needed based on labs.   Chronic respiratory failure with hypoxia (HCC) Panlobular emphysema (HCC) Patient is seen by  pulmonary, who manage this condition.  He is well controlled with current therapy.   Will defer to them for further changes to plan of care.  Other iron deficiency anemia 2/2 CKD.  Will continue monitoring.   Essential hypertension Blood pressure well controlled with current medications.  Continue current therapy.  Will reassess at follow up.   - CBC w/Diff - CMP w/eGFR  ESRD (end stage renal disease) (HCC) Patient is seen by nephrology, who manage this condition.  He is well controlled with current therapy.   Will defer to them for further changes to plan of care.     Return will schedule once his wife has her follow up.   Total time spent: 20 minutes  ALAN CHRISTELLA ARRANT, FNP  01/22/2024   This document may have been prepared by [facility] Voice Recognition software and as such may include unintentional dictation errors.

## 2024-01-23 NOTE — Assessment & Plan Note (Signed)
 Patient is seen by ophthalamology, who manage this condition.  He is well controlled with current therapy.   Will defer to them for further changes to plan of care.

## 2024-01-23 NOTE — Assessment & Plan Note (Signed)
 Patient is seen by pulmonary, who manage this condition.  He is well controlled with current therapy.   Will defer to them for further changes to plan of care.

## 2024-01-23 NOTE — Assessment & Plan Note (Signed)
 2/2 CKD.  Will continue monitoring.

## 2024-01-23 NOTE — Assessment & Plan Note (Signed)
 Continue current diabetes POC, as patient has been well controlled on current regimen.  Will adjust meds if needed based on labs.

## 2024-01-23 NOTE — Assessment & Plan Note (Signed)
 Patient is seen by nephrology, who manage this condition.  He is well controlled with current therapy.   Will defer to them for further changes to plan of care.

## 2024-01-23 NOTE — Assessment & Plan Note (Signed)
 Blood pressure well controlled with current medications.  Continue current therapy.  Will reassess at follow up.   - CBC w/Diff - CMP w/eGFR

## 2024-01-23 NOTE — Assessment & Plan Note (Signed)
 Continue current meds.  Will adjust as needed based on results.  The patient is asked to make an attempt to improve diet and exercise patterns to aid in medical management of this problem. Addressed importance of increasing and maintaining water  intake.

## 2024-01-25 DIAGNOSIS — D631 Anemia in chronic kidney disease: Secondary | ICD-10-CM | POA: Diagnosis not present

## 2024-01-25 DIAGNOSIS — D509 Iron deficiency anemia, unspecified: Secondary | ICD-10-CM | POA: Diagnosis not present

## 2024-01-25 DIAGNOSIS — N186 End stage renal disease: Secondary | ICD-10-CM | POA: Diagnosis not present

## 2024-01-25 DIAGNOSIS — Z992 Dependence on renal dialysis: Secondary | ICD-10-CM | POA: Diagnosis not present

## 2024-01-30 NOTE — Progress Notes (Signed)
 MRN : [id number]  [name] [name] is a 78 y.o. (15-Jul-1945) male who presents with chief complaint of check access.  History of Present Illness:   The patient returns to the office for followup of their dialysis access.   The patient reports the function of the access has been stable. Patient denies difficulty with cannulation. The patient denies increased bleeding time after removing the needles. The patient denies hand pain or other symptoms consistent with steal phenomena.  No significant arm swelling.  The patient denies any complaints from the dialysis center or their nephrologist.  The patient denies redness or swelling at the access site. The patient denies fever or chills at home or while on dialysis.  No recent shortening of the patient's walking distance or new symptoms consistent with claudication.  No history of rest pain symptoms. No new ulcers or wounds of the lower extremities have occurred.  The patient denies amaurosis fugax or recent TIA symptoms. There are no recent neurological changes noted. There is no history of DVT, PE or superficial thrombophlebitis. No recent episodes of angina or shortness of breath documented.   Duplex ultrasound of the AV access shows a patent access.  The previously noted stenosis is not significantly changed compared to last study.  Flow volume today is 2086 cc/min    No outpatient medications have been marked as taking for the 01/31/24 encounter (Appointment) with Jama, Cordella MATSU, MD.    Past Medical History:  Diagnosis Date   Accidental cut, puncture, perforation, or hemorrhage during heart catheterization 12/27/2012   Acute kidney injury superimposed on stage 4 chronic kidney disease (HCC) 07/27/2020   Acute on chronic combined systolic and diastolic CHF (congestive heart failure) (HCC) 04/25/2023   Acute on chronic respiratory failure with hypoxia (HCC) 04/22/2023   Adopted    AKI (acute kidney  injury) 02/05/2023   ANA positive 07/12/2015   Aortic atherosclerosis    Aortic stenosis, severe    a.) s/p TAVR on 08/01/2016: 29 mm CoreValve Evolut placed   Arthritis    Bacteremia due to Streptococcus 04/24/2023   Basal cell carcinoma 01/04/2010   Right sup. post. helix. Excised 03/02/2010   BCC (basal cell carcinoma of skin) 11/24/2020   R neck below the ear, EDC   BPH (benign prostatic hyperplasia)    CAD (coronary artery disease) 12/26/2012   a.) LHC 12/26/2012: 50% mLAD, 80% mLCx, 100% pRCA --> [facility] at [facility] --> decided again revascularization due to insig LAD disease and the fact that RCA lesion was chronic   CAP (community acquired pneumonia) 07/26/2020   Chronic respiratory failure with hypoxia (HCC)    Claudication of both lower extremities    Complete tear of right rotator cuff 11/15/2015   Complex tear of lateral meniscus of left knee as current injury 12/06/2020   Complex tear of medial meniscus of left knee as current injury 10/18/2020   COPD (chronic obstructive pulmonary disease) (HCC)    Diarrhea 02/04/2023   Diet-controlled type 2 diabetes mellitus (HCC)    Diverticulosis    Dyspnea    Electrolyte abnormality 02/04/2023   ESRD (end stage renal disease) (HCC)    GERD (gastroesophageal reflux disease)    Gout    Heart murmur    HFrEF (heart failure with reduced ejection fraction) (HCC)    a.) s/p TAVR on 08/01/2016: 29 mm CoreValve Evolut placed   History  of bilateral cataract extraction    History of hiatal hernia    HLD (hyperlipidemia)    Hyperkalemia 07/30/2020   Hypertension    Hypomagnesemia 02/05/2023   Hypothyroidism    IDA (iron deficiency anemia)    Migraines    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   a.) TTE 04/24/2023: moderate (MPG 7.5 mmHG)   OSA on CPAP    Pneumonia 07/2020   Pulmonary hypertension (HCC)    a.) TTE 08/17/2022: mild pHTN per echo report; b.) TTE 04/24/2023: RVSP 39.1   Recurrent nephrolithiasis 12/27/2012   Rotator cuff tendinitis,  right 11/15/2015   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 MOHS.   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/[place])     Past Surgical History:  Procedure Laterality Date   AORTIC VALVE REPLACEMENT N/A 08/01/2016   29 mm CoreValve Evolut; Location: [facility]; Surgeon: Reyes Fruits, MD   AV FISTULA PLACEMENT Right 06/27/2023   Procedure: ARTERIOVENOUS (AV) FISTULA CREATION (BRACHIALCEPHALIC);  Surgeon: Jama Cordella MATSU, MD;  Location: [facility];  Service: Vascular;  Laterality: Right;   CARDIAC CATHETERIZATION N/A 12/26/2012   2v CAD; retained pigtail in myocardium --> transferred to [facility]; Location: [facility]; Surgeon: Vita Bathe, MD   CATARACT EXTRACTION W/PHACO Left 10/03/2022   Procedure: CATARACT EXTRACTION PHACO AND INTRAOCULAR LENS PLACEMENT (IOC) LEFT DIABETIC 8.22 00:55.5;  Surgeon: Jaye Fallow, MD;  Location: [facility];  Service: Ophthalmology;  Laterality: Left;  sleep apnea   CATARACT EXTRACTION W/PHACO Right 10/17/2022   Procedure: CATARACT EXTRACTION PHACO AND INTRAOCULAR LENS PLACEMENT (IOC) RIGHT DIABETIC;  Surgeon: Jaye Fallow, MD;  Location: [facility];  Service: Ophthalmology;  Laterality: Right;  4.79 0:37.4   COLONOSCOPY     COLONOSCOPY N/A 04/28/2023   Procedure: COLONOSCOPY;  Surgeon: Toledo, Ladell POUR, MD;  Location: [facility];  Service: Gastroenterology;  Laterality: N/A;   DIALYSIS/PERMA CATHETER INSERTION N/A 08/29/2023   Procedure: DIALYSIS/PERMA CATHETER INSERTION;  Surgeon: Jama Cordella MATSU, MD;  Location: [facility];  Service: Cardiovascular;  Laterality: N/A;   DIALYSIS/PERMA CATHETER REMOVAL N/A 11/12/2023   Procedure: DIALYSIS/PERMA CATHETER REMOVAL;  Surgeon: Marea Selinda RAMAN, MD;  Location: [facility];  Service: Cardiovascular;  Laterality: N/A;   KNEE ARTHROSCOPY WITH MEDIAL MENISECTOMY Left 12/02/2020   Procedure: KNEE ARTHROSCOPY WITH DEBRIDEMENT AND PARTIAL MEDIAL AND LATERAL MENISECTOMY;  Surgeon: Edie Norleen PARAS, MD;  Location: [facility];  Service: Orthopedics;  Laterality: Left;   LITHOTRIPSY     PERCUTANEOUS REMOVAL INTRA-AORTIC BALLOON CATH N/A 12/26/2012   Procedure: PERCUTANEOUS REMOVAL INTRA-AORTIC BALLOON CATH; Surgeon: Lang Lannie Salter, MD; Location: [facility]; Service: Cardiothoracic   POLYPECTOMY  04/28/2023   Procedure: POLYPECTOMY;  Surgeon: Aundria, Ladell POUR, MD;  Location: [facility];  Service: Gastroenterology;;   RIGHT HEART CATH Right 07/13/2016   Location: [facility]; Surgeon: Ozell Estelle, MD   TEE WITHOUT CARDIOVERSION Right 04/26/2023   Procedure: TRANSESOPHAGEAL ECHOCARDIOGRAM (TEE);  Surgeon: Bathe Denyse LABOR, MD;  Location: [facility];  Service: Cardiovascular;  Laterality: Right;   TRANSESOPHAGEAL  ECHOCARDIOGRAM N/A 12/26/2012   Procedure: TRANSESOPHAGEAL ECHOCARDIOGRAPHY; Surgeon: Lang Lannie Salter, MD; Location: [facility]; Service: Cardiothoracic   UMBILICAL HERNIA REPAIR N/A 02/13/2014   Procedure: LAPAROSCOPIC UMBILICAL HERNIA REPAIR; Surgeon: Shanda ONEIDA Salter, MD; Location: [facility]; Service: General Surgery    Social History Social History   Tobacco Use   Smoking status: Former    Current packs/day: 0.00    Average packs/day: 2.0 packs/day for 54.0 years (108.0 ttl pk-yrs)    Types: Cigarettes    Start date: 48    Quit date: 2010    Years since quitting: 15.7   Smokeless tobacco: Former    Types: Chew    Quit date: 05/16/2004  Vaping Use   Vaping status: Never Used  Substance  Use Topics   Alcohol use: Yes    Comment: rare beer   Drug use: Never    Family History Family History  Adopted: Yes  Problem Relation Age of Onset   Varicose Veins Daughter    Obesity Daughter    Hyperlipidemia Daughter    Diabetes Daughter    COPD Daughter    Depression Daughter    Asthma Daughter    Arthritis Son    Diabetes Son    Hypertension Son     Allergies  Allergen Reactions   Nsaids Other (See Comments)    Avoid due to kidney disease    Statins Other (See Comments)    Myalgias   Nexletol [Bempedoic Acid] Diarrhea    fatigue   Pedi-Pre Tape Spray [Wound Dressing Adhesive]    Silver Other (See Comments)   Tegaderm High Gelling Alginate [Wound Dressings] Rash     REVIEW OF SYSTEMS (Negative unless checked)  Constitutional: [] Weight loss  [] Fever  [] Chills Cardiac: [] Chest pain   [] Chest pressure   [] Palpitations   [] Shortness of breath when laying flat   [] Shortness of breath with exertion. Vascular:  [] Pain in legs with walking   [] Pain in legs at rest  [] History of DVT   [] Phlebitis   [] Swelling in legs   [] Varicose veins   [] Non-healing ulcers Pulmonary:   [] Uses home oxygen    [] Productive cough   [] Hemoptysis   [] Wheeze  [] COPD   [] Asthma Neurologic:  [] Dizziness   [] Seizures   [] History of stroke   [] History of TIA  [] Aphasia   [] Vissual changes   [] Weakness or numbness in arm   [] Weakness or numbness in leg Musculoskeletal:   [] Joint swelling   [] Joint pain   [] Low back pain Hematologic:  [] Easy bruising  [] Easy bleeding   [] Hypercoagulable state   [] Anemic Gastrointestinal:  [] Diarrhea   [] Vomiting  [] Gastroesophageal reflux/heartburn   [] Difficulty swallowing. Genitourinary:  [x] Chronic kidney disease   [] Difficult urination  [] Frequent urination   [] Blood in urine Skin:  [] Rashes   [] Ulcers  Psychological:  [] History of anxiety   []  History of major depression.  Physical Examination  There were no vitals filed for this visit. There is no height or  weight on file to calculate BMI. Gen: WD/WN, NAD Head: [place]/AT, No temporalis wasting.  Ear/Nose/Throat: Hearing grossly intact, nares w/o erythema or drainage Eyes: PER, EOMI, sclera nonicteric.  Neck: Supple, no gross masses or lesions.  No JVD.  Pulmonary:  Good air movement, no audible wheezing, no use of accessory muscles.  Cardiac: RRR, precordium non-hyperdynamic. Vascular:   Good thrill good bruit right arm AV access Vessel Right Left  Radial Palpable Palpable  Brachial Palpable Palpable  Gastrointestinal: soft, non-distended. No guarding/no peritoneal  signs.  Musculoskeletal: M/S 5/5 throughout.  No deformity.  Neurologic: CN 2-12 intact. Pain and light touch intact in extremities.  Symmetrical.  Speech is fluent. Motor exam as listed above. Psychiatric: Judgment intact, Mood & affect appropriate for pt's clinical situation. Dermatologic: No rashes or ulcers noted.  No changes consistent with cellulitis.   CBC Lab Results  Component Value Date   WBC 5.8 01/16/2024   HGB 10.3 (L) 01/16/2024   HCT 31.7 (L) 01/16/2024   MCV 101 (H) 01/16/2024   PLT CANCELED 01/16/2024    BMET    Component Value Date/Time   NA 143 01/16/2024 1020   NA 138 07/27/2014 1247   K 4.7 01/16/2024 1020   K 4.2 07/27/2014 1247   CL 101 01/16/2024 1020   CL 105 07/27/2014 1247   CO2 22 01/16/2024 1020   CO2 26 07/27/2014 1247   GLUCOSE 107 (H) 01/16/2024 1020   GLUCOSE 100 (H) 12/25/2023 1237   GLUCOSE 135 (H) 07/27/2014 1247   BUN 40 (H) 01/16/2024 1020   BUN 28 (H) 07/27/2014 1247   CREATININE 4.10 (H) 01/16/2024 1020   CREATININE 1.64 (H) 07/27/2014 1247   CALCIUM 9.2 01/16/2024 1020   CALCIUM 8.7 (L) 07/27/2014 1247   GFRNONAA 17 (L) 12/25/2023 1237   GFRNONAA 42 (L) 07/27/2014 1247   GFRAA 23 (L) 07/24/2016 1919   GFRAA 48 (L) 07/27/2014 1247   Estimated Creatinine Clearance: 14.7 mL/min (A) (by C-G formula based on SCr of 4.1 mg/dL (H)).  COAG Lab Results  Component Value Date    INR 1.1 04/21/2023   INR 1.0 07/27/2014    Radiology VAS US  DUPLEX DIALYSIS ACCESS (AVF, AVG) Result Date: 01/22/2024 DIALYSIS ACCESS Patient Name:  ANDRIK SANDT  Date of Exam:   01/21/2024 Medical Rec #: [id number]                Accession #:    [id number] Date of Birth: 10/13/1945               Patient Gender: M Patient Age:   3 years Exam Location:  [facility] Procedure:      VAS US  DUPLEX DIALYSIS ACCESS (AVF, AVG) Referring Phys: CORDELLA SHAWL --------------------------------------------------------------------------------  Reason for Exam: Routine follow up. Access Site: Right Upper Extremity. Access Type: Brachial-cephalic AVF. History: 06/04/2023: Right Brachial Cephalic AVF created. Comparison Study: 06/2023 Performing Technologist: Jerel Croak RVT  Examination Guidelines: A complete evaluation includes B-mode imaging, spectral Doppler, color Doppler, and power Doppler as needed of all accessible portions of each vessel. Unilateral testing is considered an integral part of a complete examination. Limited examinations for reoccurring indications may be performed as noted.  Findings: +--------------------+----------+-----------------+--------+ AVF                 PSV (cm/s)Flow Vol (mL/min)Comments +--------------------+----------+-----------------+--------+ Native artery inflow   210          2086                +--------------------+----------+-----------------+--------+ AVF Anastomosis        353                              +--------------------+----------+-----------------+--------+  +---------------+----------+-------------+----------+--------+ OUTFLOW VEIN   PSV (cm/s)Diameter (cm)Depth (cm)Describe +---------------+----------+-------------+----------+--------+ Subclavian vein   104                                    +---------------+----------+-------------+----------+--------+  Confluence        189                                     +---------------+----------+-------------+----------+--------+ Shoulder          189                                    +---------------+----------+-------------+----------+--------+ Prox UA           266                                    +---------------+----------+-------------+----------+--------+ Mid UA            135                                    +---------------+----------+-------------+----------+--------+ Dist UA           178                                    +---------------+----------+-------------+----------+--------+   Summary: Patent arteriovenous fistula. *See table(s) above for measurements and observations.  Diagnosing physician: Cordella Shawl MD Electronically signed by Cordella Shawl MD on 01/22/2024 at 9:42:25 AM.   --------------------------------------------------------------------------------   Final      Assessment/Plan 1. ESRD (end stage renal disease) (HCC) (Primary) Recommend:  The patient is doing well and currently has adequate dialysis access. The patient's dialysis center is not reporting any access issues. Flow pattern is stable when compared to the prior ultrasound.  The patient should have a duplex ultrasound of the dialysis access in 6 months. The patient will follow-up with me in the office after each ultrasound   - VAS US  DUPLEX DIALYSIS ACCESS (AVF, AVG); Future  2. Essential hypertension Continue antihypertensive medications as already ordered, these medications have been reviewed and there are no changes at this time.  3. Coronary artery disease involving native coronary artery of native heart with other form of angina pectoris Continue cardiac and antihypertensive medications as already ordered and reviewed, no changes at this time.  Continue statin as ordered and reviewed, no changes at this time  Nitrates PRN for chest pain  4. Panlobular emphysema (HCC) Continue pulmonary medications and aerosols as already  ordered, these medications have been reviewed and there are no changes at this time.   5. Mixed hyperlipidemia Continue statin as ordered and reviewed, no changes at this time    Cordella Shawl, MD  01/30/2024 2:28 PM

## 2024-01-31 ENCOUNTER — Ambulatory Visit (INDEPENDENT_AMBULATORY_CARE_PROVIDER_SITE_OTHER): Admitting: Vascular Surgery

## 2024-01-31 ENCOUNTER — Encounter (INDEPENDENT_AMBULATORY_CARE_PROVIDER_SITE_OTHER): Payer: Self-pay | Admitting: Vascular Surgery

## 2024-01-31 ENCOUNTER — Telehealth: Payer: Self-pay | Admitting: Family

## 2024-01-31 VITALS — BP 124/61 | HR 68 | Resp 16

## 2024-01-31 DIAGNOSIS — J431 Panlobular emphysema: Secondary | ICD-10-CM

## 2024-01-31 DIAGNOSIS — N186 End stage renal disease: Secondary | ICD-10-CM

## 2024-01-31 DIAGNOSIS — I25118 Atherosclerotic heart disease of native coronary artery with other forms of angina pectoris: Secondary | ICD-10-CM | POA: Diagnosis not present

## 2024-01-31 DIAGNOSIS — E782 Mixed hyperlipidemia: Secondary | ICD-10-CM

## 2024-01-31 DIAGNOSIS — I10 Essential (primary) hypertension: Secondary | ICD-10-CM | POA: Diagnosis not present

## 2024-01-31 MED ORDER — LEVOFLOXACIN 500 MG PO TABS: 500.0000 mg | ORAL_TABLET | ORAL | 0 refills | Status: DC

## 2024-01-31 NOTE — Telephone Encounter (Signed)
 [name] left VM stating the patient is having congestion and blowing out yellow stuff. They are requesting we send an antibiotic in to [facility] for patient. Please advise.

## 2024-02-03 ENCOUNTER — Encounter (INDEPENDENT_AMBULATORY_CARE_PROVIDER_SITE_OTHER): Payer: Self-pay | Admitting: Vascular Surgery

## 2024-02-03 DIAGNOSIS — J449 Chronic obstructive pulmonary disease, unspecified: Secondary | ICD-10-CM | POA: Diagnosis not present

## 2024-02-11 DIAGNOSIS — H353211 Exudative age-related macular degeneration, right eye, with active choroidal neovascularization: Secondary | ICD-10-CM | POA: Diagnosis not present

## 2024-02-12 ENCOUNTER — Ambulatory Visit (INDEPENDENT_AMBULATORY_CARE_PROVIDER_SITE_OTHER): Admitting: Cardiovascular Disease

## 2024-02-12 ENCOUNTER — Encounter: Payer: Self-pay | Admitting: Cardiovascular Disease

## 2024-02-12 VITALS — BP 124/52 | HR 76 | Ht 65.0 in | Wt 185.4 lb

## 2024-02-12 DIAGNOSIS — R0602 Shortness of breath: Secondary | ICD-10-CM

## 2024-02-12 DIAGNOSIS — Z131 Encounter for screening for diabetes mellitus: Secondary | ICD-10-CM

## 2024-02-12 DIAGNOSIS — Z952 Presence of prosthetic heart valve: Secondary | ICD-10-CM | POA: Diagnosis not present

## 2024-02-12 DIAGNOSIS — I5020 Unspecified systolic (congestive) heart failure: Secondary | ICD-10-CM

## 2024-02-12 DIAGNOSIS — I25118 Atherosclerotic heart disease of native coronary artery with other forms of angina pectoris: Secondary | ICD-10-CM | POA: Diagnosis not present

## 2024-02-12 DIAGNOSIS — I10 Essential (primary) hypertension: Secondary | ICD-10-CM | POA: Diagnosis not present

## 2024-02-12 NOTE — Progress Notes (Signed)
 Cardiology Office Note   Date:  02/12/2024   ID:  [name], [name] [birth year], MRN [id number]  PCP:  Orlean Alan HERO, FNP  Cardiologist:  Denyse Bathe, MD      History of Present Illness: [name] [name] is a 78 y.o. male who presents for  Chief Complaint  Patient presents with   Follow-up     2 month follow up    No dizziness, no chest pains. Gets SOB.      Past Medical History:  Diagnosis Date   Accidental cut, puncture, perforation, or hemorrhage during heart catheterization 12/27/2012   Acute kidney injury superimposed on stage 4 chronic kidney disease (HCC) 07/27/2020   Acute on chronic combined systolic and diastolic CHF (congestive heart failure) (HCC) 04/25/2023   Acute on chronic respiratory failure with hypoxia (HCC) 04/22/2023   Adopted    AKI (acute kidney injury) 02/05/2023   ANA positive 07/12/2015   Aortic atherosclerosis    Aortic stenosis, severe    a.) s/p TAVR on 08/01/2016: 29 mm CoreValve Evolut placed   Arthritis    Bacteremia due to Streptococcus 04/24/2023   Basal cell carcinoma 01/04/2010   Right sup. post. helix. Excised 03/02/2010   BCC (basal cell carcinoma of skin) 11/24/2020   R neck below the ear, EDC   BPH (benign prostatic hyperplasia)    CAD (coronary artery disease) 12/26/2012   a.) LHC 12/26/2012: 50% mLAD, 80% mLCx, 100% pRCA --> CVTS at [facility] --> decided again revascularization due to insig LAD disease and the fact that RCA lesion was chronic   CAP (community acquired pneumonia) 07/26/2020   Chronic respiratory failure with hypoxia (HCC)    Claudication of both lower extremities    Complete tear of right rotator cuff 11/15/2015   Complex tear of lateral meniscus of left knee as current injury 12/06/2020   Complex tear of medial meniscus of left knee as current injury 10/18/2020   COPD (chronic obstructive pulmonary disease) (HCC)    Diarrhea 02/04/2023   Diet-controlled type 2 diabetes mellitus  (HCC)    Diverticulosis    Dyspnea    Electrolyte abnormality 02/04/2023   ESRD (end stage renal disease) (HCC)    GERD (gastroesophageal reflux disease)    Gout    Heart murmur    HFrEF (heart failure with reduced ejection fraction) (HCC)    a.) s/p TAVR on 08/01/2016: 29 mm CoreValve Evolut placed   History of bilateral cataract extraction    History of hiatal hernia    HLD (hyperlipidemia)    Hyperkalemia 07/30/2020   Hypertension    Hypomagnesemia 02/05/2023   Hypothyroidism    IDA (iron deficiency anemia)    Migraines    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   a.) TTE 04/24/2023: moderate (MPG 7.5 mmHG)   OSA on CPAP    Pneumonia 07/2020   Pulmonary hypertension (HCC)    a.) TTE 08/17/2022: mild pHTN per echo report; b.) TTE 04/24/2023: RVSP 39.1   Recurrent nephrolithiasis 12/27/2012   Rotator cuff tendinitis, right 11/15/2015   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 MOHS.   Squamous cell carcinoma of  skin 03/07/2021   Right zygoma, EDC 05/02/21   Statin intolerance (myalgias)    Supplemental oxygen  dependent (2-3 L/[place])      Past Surgical History:  Procedure Laterality Date   AORTIC VALVE REPLACEMENT N/A 08/01/2016   29 mm CoreValve Evolut; Location: [facility]; Surgeon: Reyes Fruits, MD   AV FISTULA PLACEMENT Right 06/27/2023   Procedure: ARTERIOVENOUS (AV) FISTULA CREATION (BRACHIALCEPHALIC);  Surgeon: Jama Cordella MATSU, MD;  Location: [facility];  Service: Vascular;  Laterality: Right;   CARDIAC CATHETERIZATION N/A 12/26/2012   2v CAD; retained pigtail in myocardium --> transferred to [facility]; Location: [facility]; Surgeon:  Vita Bathe, MD   CATARACT EXTRACTION W/PHACO Left 10/03/2022   Procedure: CATARACT EXTRACTION PHACO AND INTRAOCULAR LENS PLACEMENT (IOC) LEFT DIABETIC 8.22 00:55.5;  Surgeon: Jaye Fallow, MD;  Location: [facility];  Service: Ophthalmology;  Laterality: Left;  sleep apnea   CATARACT EXTRACTION W/PHACO Right 10/17/2022   Procedure: CATARACT EXTRACTION PHACO AND INTRAOCULAR LENS PLACEMENT (IOC) RIGHT DIABETIC;  Surgeon: Jaye Fallow, MD;  Location: [facility];  Service: Ophthalmology;  Laterality: Right;  4.79 0:37.4   COLONOSCOPY     COLONOSCOPY N/A 04/28/2023   Procedure: COLONOSCOPY;  Surgeon: Toledo, Ladell POUR, MD;  Location: [facility];  Service: Gastroenterology;  Laterality: N/A;   DIALYSIS/PERMA CATHETER INSERTION N/A 08/29/2023   Procedure: DIALYSIS/PERMA CATHETER INSERTION;  Surgeon: Jama Cordella MATSU, MD;  Location: [facility];  Service: Cardiovascular;  Laterality: N/A;   DIALYSIS/PERMA CATHETER REMOVAL N/A 11/12/2023   Procedure: DIALYSIS/PERMA CATHETER REMOVAL;  Surgeon: Marea Selinda RAMAN, MD;  Location: [facility];  Service: Cardiovascular;  Laterality: N/A;   KNEE ARTHROSCOPY WITH MEDIAL MENISECTOMY Left 12/02/2020   Procedure: KNEE ARTHROSCOPY WITH DEBRIDEMENT AND PARTIAL MEDIAL AND LATERAL MENISECTOMY;  Surgeon: Edie Norleen PARAS, MD;  Location: [facility];  Service: Orthopedics;  Laterality: Left;   LITHOTRIPSY     PERCUTANEOUS REMOVAL INTRA-AORTIC BALLOON CATH N/A 12/26/2012   Procedure: PERCUTANEOUS REMOVAL INTRA-AORTIC BALLOON CATH; Surgeon: Lang Lannie Salter, MD; Location: [facility]; Service: Cardiothoracic   POLYPECTOMY  04/28/2023   Procedure: POLYPECTOMY;  Surgeon: Aundria, Ladell POUR, MD;  Location: [facility];  Service: Gastroenterology;;   RIGHT HEART CATH Right 07/13/2016   Location: [facility]; Surgeon: Ozell Estelle, MD   TEE WITHOUT CARDIOVERSION Right 04/26/2023   Procedure: TRANSESOPHAGEAL ECHOCARDIOGRAM (TEE);   Surgeon: Bathe Denyse LABOR, MD;  Location: [facility];  Service: Cardiovascular;  Laterality: Right;   TRANSESOPHAGEAL ECHOCARDIOGRAM N/A 12/26/2012   Procedure: TRANSESOPHAGEAL ECHOCARDIOGRAPHY; Surgeon: Lang Lannie Salter, MD; Location: [facility]; Service: Cardiothoracic   UMBILICAL HERNIA REPAIR N/A 02/13/2014   Procedure: LAPAROSCOPIC UMBILICAL HERNIA REPAIR; Surgeon: Shanda ONEIDA Salter, MD; Location: [facility]; Service: General Surgery     Current Outpatient Medications  Medication Sig Dispense Refill   acetaminophen  (TYLENOL ) 650 MG CR tablet Take 1,300 mg by mouth every 8 (eight) hours as needed for pain.     albuterol  (VENTOLIN  HFA) 108 (90 Base) MCG/ACT inhaler Inhale 2 puffs into the lungs every 6 (six) hours as needed for wheezing or shortness of breath.     Alcohol Swabs (DROPSAFE ALCOHOL PREP) 70 % PADS Apply 1 each topically as needed (With repatha and blood sugar checks.).     Ascorbic Acid  (VITAMIN C ) 1000 MG tablet Take 1,000 mg by mouth at bedtime.     aspirin  EC 81 MG tablet Take 81 mg by mouth daily. Swallow whole.     busPIRone  (BUSPAR ) 5 MG tablet Take 1 tablet (  5 mg total) by mouth at bedtime. 90 tablet 1   calcitRIOL  (ROCALTROL ) 0.25 MCG capsule Take 0.25 mcg by mouth daily.     citalopram  (CELEXA ) 40 MG tablet Take 1 tablet (40 mg total) by mouth daily. 90 tablet 3   Evolocumab 140 MG/ML SOAJ Inject 140 mg into the skin every 14 (fourteen) days.     ezetimibe  (ZETIA ) 10 MG tablet TAKE 1 TABLET BY MOUTH AT BEDTIME 90 tablet 1   febuxostat  (ULORIC ) 40 MG tablet Take 40 mg by mouth at bedtime.     fexofenadine (ALLEGRA) 180 MG tablet Take 180 mg by mouth daily as needed for allergies or rhinitis.     Fluticasone -Umeclidin-Vilant (TRELEGY ELLIPTA ) 100-62.5-25 MCG/ACT AEPB Inhale 1 Inhalation into the lungs daily at 6 (six) AM. 60 each 11   levofloxacin  (LEVAQUIN ) 500 MG tablet Take 1 tablet (500 mg total) by mouth every other day. 5 tablet 0   MAGNESIUM  OXIDE  400 PO Take by mouth Nightly.     metolazone (ZAROXOLYN) 5 MG tablet Take 5 mg by mouth once a week.     metoprolol  succinate (TOPROL -XL) 50 MG 24 hr tablet TAKE 1 TABLET EVERY DAY 90 tablet 3   Multiple Vitamins-Minerals (PRESERVISION AREDS) CAPS Take 1 capsule by mouth 2 (two) times daily.     OHTUVAYRE  3 MG/2.5ML SUSP Inhale 3 mg into the lungs.     omeprazole  (PRILOSEC) 40 MG capsule Take 1 capsule (40 mg total) by mouth daily. 30 capsule 3   OXYGEN  Inhale 2-3 L into the lungs See admin instructions. Used as needed throughout the day and continuous at bedtime     pantoprazole  (PROTONIX ) 40 MG tablet      SUPER B COMPLEX/C PO Take 1 tablet by mouth daily.     torsemide  (DEMADEX ) 20 MG tablet Take 20 mg by mouth daily.     No current facility-administered medications for this visit.    Allergies:   Nsaids, Statins, Nexletol [bempedoic acid], Pedi-pre tape spray [wound dressing adhesive], Silver, and Tegaderm high gelling alginate [wound dressings]    Social History:   reports that he quit smoking about 15 years ago. His smoking use included cigarettes. He started smoking about 69 years ago. He has a 108 pack-year smoking history. He quit smokeless tobacco use about 19 years ago.  His smokeless tobacco use included chew. He reports current alcohol use. He reports that he does not use drugs.   Family History:  family history includes Arthritis in his son; Asthma in his daughter; COPD in his daughter; Depression in his daughter; Diabetes in his daughter and son; Hyperlipidemia in his daughter; Hypertension in his son; Obesity in his daughter; Varicose Veins in his daughter. He was adopted.    ROS:     Review of Systems  Constitutional: Negative.   HENT: Negative.    Eyes: Negative.   Respiratory: Negative.    Gastrointestinal: Negative.   Genitourinary: Negative.   Musculoskeletal: Negative.   Skin: Negative.   Neurological: Negative.   Endo/Heme/Allergies: Negative.    Psychiatric/Behavioral: Negative.    All other systems reviewed and are negative.     All other systems are reviewed and negative.    PHYSICAL EXAM: VS:  BP (!) 124/52   Pulse 76   Ht 5' 5 (1.651 m)   Wt 185 lb 6.4 oz (84.1 kg)   SpO2 (!) 87%   BMI 30.85 kg/m  , BMI Body mass index is 30.85 kg/m. Last weight:  Wt  Readings from Last 3 Encounters:  02/12/24 185 lb 6.4 oz (84.1 kg)  01/22/24 187 lb 9.6 oz (85.1 kg)  01/10/24 184 lb (83.5 kg)     Physical Exam Vitals reviewed.  Constitutional:      Appearance: Normal appearance. He is normal weight.  HENT:     Head: Normocephalic.     Nose: Nose normal.     Mouth/Throat:     Mouth: Mucous membranes are moist.  Eyes:     Pupils: Pupils are equal, round, and reactive to light.  Cardiovascular:     Rate and Rhythm: Normal rate and regular rhythm.     Pulses: Normal pulses.     Heart sounds: Normal heart sounds.  Pulmonary:     Effort: Pulmonary effort is normal.  Abdominal:     General: Abdomen is flat. Bowel sounds are normal.  Musculoskeletal:        General: Normal range of motion.     Cervical back: Normal range of motion.  Skin:    General: Skin is warm.  Neurological:     General: No focal deficit present.     Mental Status: He is alert.  Psychiatric:        Mood and Affect: Mood normal.       EKG:   Recent Labs: 04/21/2023: B Natriuretic Peptide 820.9 09/10/2023: Magnesium  1.7 01/16/2024: ALT 9; BUN 40; Creatinine, Ser 4.10; Hemoglobin 10.3; Platelets CANCELED; Potassium 4.7; Sodium 143; TSH 4.530    Lipid Panel    Component Value Date/Time   CHOL 68 (L) 01/16/2024 1020   CHOL 100 07/26/2012 0111   TRIG 55 01/16/2024 1020   TRIG 157 07/26/2012 0111   HDL 40 01/16/2024 1020   HDL 35 (L) 07/26/2012 0111   CHOLHDL 1.7 01/16/2024 1020   VLDL 31 07/26/2012 0111   LDLCALC 14 01/16/2024 1020   LDLCALC 34 07/26/2012 0111      Other studies Reviewed: Additional studies/ records that were  reviewed today include:  Review of the above records demonstrates:       No data to display            ASSESSMENT AND PLAN:    ICD-10-CM   1. SOB (shortness of breath)  R06.02    Needs more fluids removed fro HD.    2. Coronary artery disease involving native coronary artery of native heart with other form of angina pectoris  I25.118     3. Essential hypertension  I10     4. HFrEF (heart failure with reduced ejection fraction) (HCC)  I50.20     5. Severe obesity (BMI 35.0-39.9) with comorbidity (HCC)  E66.01     6. S/P TAVR (transcatheter aortic valve replacement)  Z95.2        Problem List Items Addressed This Visit       Cardiovascular and Mediastinum   Essential hypertension   Coronary artery disease   HFrEF (heart failure with reduced ejection fraction) (HCC)     Other   SOB (shortness of breath) - Primary   Severe obesity (BMI 35.0-39.9) with comorbidity (HCC)   Other Visit Diagnoses       S/P TAVR (transcatheter aortic valve replacement)              Disposition:   Return in about 2 months (around 04/13/2024).    Total time spent: 35 minutes  Signed,  Denyse Bathe, MD  02/12/2024 11:13 AM    [facility]

## 2024-02-19 ENCOUNTER — Other Ambulatory Visit: Payer: Self-pay

## 2024-02-19 DIAGNOSIS — N186 End stage renal disease: Secondary | ICD-10-CM

## 2024-02-19 NOTE — Progress Notes (Signed)
 02/19/2024 Name: [name] [name] MRN: [id number] DOB: 04/11/1946  Chief Complaint  Patient presents with   Medication Management    [name] [name] is a 78 y.o. year old male who presented for a telephone visit.   They were referred to the pharmacist by their PCP for assistance in managing complex medication management.    Subjective:  Care Team: Primary Care Provider: Orlean Alan HERO, FNP ; Next Scheduled Visit: 03/11/24  Medication Access/Adherence  Current Pharmacy:  [id number] - [place], [place] - [address] [place] [address] Phone: [phone] Fax: [phone]  [id number] [place], [place] - [address] AT [facility] OF [address] [facility] [address] [place] [address] Phone: [phone] Fax: [phone]  [facility] - [place], [place] - [address] [place] [address] Phone: [phone] Fax: [phone]  [facility] - [place], [place] - [address] [place] [address] Phone: [phone] Fax: [phone]   Patient reports affordability concerns with their medications: No  Patient reports access/transportation concerns to their pharmacy: No  Patient reports adherence concerns with their medications:  No     Med Mgmt -Has started new nebulizing treatment since last pharmacist visit, reports going well  -Patient's dialysis tx have been adjusted down to M and F, no longer goes on Wed; still having cramping. Report that electrolytes are being monitored at dialysis -Having low DBP, reports they are monitoring at home and working on hydration   Objective:  Lab Results  Component Value Date   HGBA1C 5.1 01/16/2024    Lab Results  Component Value Date   CREATININE 4.10 (H) 01/16/2024   BUN 40 (H) 01/16/2024   NA 143 01/16/2024   K 4.7 01/16/2024   CL 101 01/16/2024   CO2 22 01/16/2024    Lab Results  Component Value  Date   CHOL 68 (L) 01/16/2024   HDL 40 01/16/2024   LDLCALC 14 01/16/2024   TRIG 55 01/16/2024   CHOLHDL 1.7 01/16/2024    Medications Reviewed Today     Reviewed by Lionell Jon DEL, RPH (Pharmacist) on 02/19/24 at 1753  Med List Status: <None>   Medication Order Taking? Sig Documenting Provider Last Dose Status Informant  acetaminophen  (TYLENOL ) 650 MG CR tablet [id number] Yes Take 1,300 mg by mouth every 8 (eight) hours as needed for pain. [provider]  Active Spouse/Significant Other  albuterol  (VENTOLIN  HFA) 108 (90 Base) MCG/ACT inhaler [id number] Yes Inhale 2 puffs into the lungs every 6 (six) hours as needed for wheezing or shortness of breath. [provider]  Active Spouse/Significant Other  Alcohol Swabs (DROPSAFE ALCOHOL PREP) 70 % PADS [id number]  Apply 1 each topically as needed (With repatha and blood sugar checks.). [provider]  Active Spouse/Significant Other  Ascorbic Acid  (VITAMIN C ) 1000 MG tablet [id number] Yes Take 1,000 mg by mouth at bedtime. [provider]  Active Spouse/Significant Other  aspirin  EC 81 MG tablet [id number] Yes Take 81 mg by mouth daily. Swallow whole. [provider]  Active   busPIRone  (BUSPAR ) 5 MG tablet [id number] Yes Take 1 tablet (5 mg total) by mouth at bedtime. Orlean Alan HERO, FNP  Active   calcitRIOL  (ROCALTROL ) 0.25 MCG capsule [id number] Yes Take 0.25 mcg by mouth daily. [provider]  Active Spouse/Significant Other  citalopram  (CELEXA ) 40 MG tablet [id number] Yes  Take 1 tablet (40 mg total) by mouth daily. Orlean Alan HERO, FNP  Active   Evolocumab 140 MG/ML EMMANUEL [id number] Yes Inject 140 mg into the skin every 14 (fourteen) days. [provider]  Active   ezetimibe  (ZETIA ) 10 MG tablet [id number] Yes TAKE 1 TABLET BY MOUTH AT BEDTIME Orlean Alan HERO, FNP  Active   febuxostat  (ULORIC ) 40 MG tablet [id number] Yes Take 40 mg by mouth at bedtime. [provider]   Active Spouse/Significant Other  fexofenadine (ALLEGRA) 180 MG tablet [id number] Yes Take 180 mg by mouth daily as needed for allergies or rhinitis. [provider]  Active Spouse/Significant Other  Fluticasone -Umeclidin-Vilant (TRELEGY ELLIPTA ) 100-62.5-25 MCG/ACT AEPB [id number] Yes Inhale 1 Inhalation into the lungs daily at 6 (six) AM. Orlean Alan HERO, FNP  Active Spouse/Significant Other    Discontinued 02/19/24 1410 (Completed Course)   MAGNESIUM  OXIDE 400 PO [id number] Yes Take by mouth Nightly. [provider]  Active   metolazone (ZAROXOLYN) 5 MG tablet [id number] Yes Take 5 mg by mouth once a week. [provider]  Active   metoprolol  succinate (TOPROL -XL) 50 MG 24 hr tablet [id number] Yes TAKE 1 TABLET EVERY DAY Orlean Alan HERO, FNP  Active Spouse/Significant Other  Multiple Vitamins-Minerals (PRESERVISION AREDS) CAPS [id number] Yes Take 1 capsule by mouth 2 (two) times daily. [provider]  Active Spouse/Significant Other  OHTUVAYRE  3 MG/2.5ML SUSP [id number] Yes Inhale 3 mg into the lungs. [provider]  Active   omeprazole  (PRILOSEC) 40 MG capsule [id number] Yes Take 1 capsule (40 mg total) by mouth daily. Orlean Alan HERO, FNP  Active   OXYGEN  [id number] Yes Inhale 2-3 L into the lungs See admin instructions. Used as needed throughout the day and continuous at bedtime [provider]  Active Spouse/Significant Other  pantoprazole  (PROTONIX ) 40 MG tablet [id number]    Patient not taking: Reported on 02/19/2024   [provider]  Active   SUPER B COMPLEX/C PO [id number] Yes Take 1 tablet by mouth daily. [provider]  Active Spouse/Significant Other  torsemide  (DEMADEX ) 20 MG tablet [id number] Yes Take 20 mg by mouth daily. [provider]  Active Spouse/Significant Other           Med Note JANEAN, Makynzee Tigges H   Tue Oct 02, 2023  3:51 PM) 2 tablets daily              Assessment/Plan:   Med  Mgmt -Recommend to use trelegy once daily as prescribed. If concerns continue, continue to discuss change to Breztri  with pulm -Continue current medication therapy for now -Continue to monitor BP routinely at home -Reach out sooner than scheduled follow up with med changes or concerns    Follow Up Plan: 2 months  Jon VEAR [place], PharmD Clinical Pharmacist [phone]

## 2024-02-20 DIAGNOSIS — H353221 Exudative age-related macular degeneration, left eye, with active choroidal neovascularization: Secondary | ICD-10-CM | POA: Diagnosis not present

## 2024-02-22 DIAGNOSIS — N186 End stage renal disease: Secondary | ICD-10-CM | POA: Diagnosis not present

## 2024-02-22 DIAGNOSIS — Z992 Dependence on renal dialysis: Secondary | ICD-10-CM | POA: Diagnosis not present

## 2024-02-29 DIAGNOSIS — Z992 Dependence on renal dialysis: Secondary | ICD-10-CM | POA: Diagnosis not present

## 2024-02-29 DIAGNOSIS — N186 End stage renal disease: Secondary | ICD-10-CM | POA: Diagnosis not present

## 2024-02-29 DIAGNOSIS — Z23 Encounter for immunization: Secondary | ICD-10-CM | POA: Diagnosis not present

## 2024-02-29 DIAGNOSIS — D509 Iron deficiency anemia, unspecified: Secondary | ICD-10-CM | POA: Diagnosis not present

## 2024-02-29 DIAGNOSIS — D631 Anemia in chronic kidney disease: Secondary | ICD-10-CM | POA: Diagnosis not present

## 2024-03-02 ENCOUNTER — Other Ambulatory Visit: Payer: Self-pay | Admitting: Cardiovascular Disease

## 2024-03-11 ENCOUNTER — Ambulatory Visit: Admitting: Family

## 2024-03-13 ENCOUNTER — Encounter: Payer: Self-pay | Admitting: Dermatology

## 2024-03-13 ENCOUNTER — Encounter: Payer: Self-pay | Admitting: Family

## 2024-03-13 ENCOUNTER — Ambulatory Visit: Admitting: Dermatology

## 2024-03-13 ENCOUNTER — Telehealth: Payer: Self-pay

## 2024-03-13 DIAGNOSIS — Z7189 Other specified counseling: Secondary | ICD-10-CM | POA: Diagnosis not present

## 2024-03-13 DIAGNOSIS — D229 Melanocytic nevi, unspecified: Secondary | ICD-10-CM

## 2024-03-13 DIAGNOSIS — E119 Type 2 diabetes mellitus without complications: Secondary | ICD-10-CM | POA: Diagnosis not present

## 2024-03-13 DIAGNOSIS — L570 Actinic keratosis: Secondary | ICD-10-CM | POA: Diagnosis not present

## 2024-03-13 DIAGNOSIS — Z1283 Encounter for screening for malignant neoplasm of skin: Secondary | ICD-10-CM

## 2024-03-13 DIAGNOSIS — W908XXA Exposure to other nonionizing radiation, initial encounter: Secondary | ICD-10-CM | POA: Diagnosis not present

## 2024-03-13 DIAGNOSIS — L821 Other seborrheic keratosis: Secondary | ICD-10-CM

## 2024-03-13 DIAGNOSIS — D1801 Hemangioma of skin and subcutaneous tissue: Secondary | ICD-10-CM | POA: Diagnosis not present

## 2024-03-13 DIAGNOSIS — D692 Other nonthrombocytopenic purpura: Secondary | ICD-10-CM | POA: Diagnosis not present

## 2024-03-13 DIAGNOSIS — Z85828 Personal history of other malignant neoplasm of skin: Secondary | ICD-10-CM

## 2024-03-13 DIAGNOSIS — R0602 Shortness of breath: Secondary | ICD-10-CM

## 2024-03-13 DIAGNOSIS — J9611 Chronic respiratory failure with hypoxia: Secondary | ICD-10-CM

## 2024-03-13 DIAGNOSIS — Z86007 Personal history of in-situ neoplasm of skin: Secondary | ICD-10-CM

## 2024-03-13 DIAGNOSIS — L814 Other melanin hyperpigmentation: Secondary | ICD-10-CM | POA: Diagnosis not present

## 2024-03-13 DIAGNOSIS — I739 Peripheral vascular disease, unspecified: Secondary | ICD-10-CM

## 2024-03-13 DIAGNOSIS — L578 Other skin changes due to chronic exposure to nonionizing radiation: Secondary | ICD-10-CM | POA: Diagnosis not present

## 2024-03-13 DIAGNOSIS — J441 Chronic obstructive pulmonary disease with (acute) exacerbation: Secondary | ICD-10-CM

## 2024-03-13 MED ORDER — FLUOROURACIL 5 % EX CREA
TOPICAL_CREAM | CUTANEOUS | 2 refills | Status: AC
Start: 2024-03-13 — End: ?

## 2024-03-13 NOTE — Patient Instructions (Addendum)
 - Start 5-fluorouracil  cream twice a day to rough areas on temples, cheeks, forehead, top of scalp/head, posterior crown of scalp until reaction occurs. Recommend spot treating one area at a time. Reviewed course of treatment and expected reaction.  Patient advised to expect inflammation and crusting and advised that erosions are possible.  Patient advised to be diligent with sun protection during and after treatment. Handout with details of how to apply medication and what to expect provided. Counseled to keep medication out of reach of children and pets.     Recommend daily broad spectrum sunscreen SPF 30+ to sun-exposed areas, reapply every 2 hours as needed. Call for new or changing lesions.  Staying in the shade or wearing long sleeves, sun glasses (UVA+UVB protection) and wide brim hats (4-inch brim around the entire circumference of the hat) are also recommended for sun protection.      Melanoma ABCDEs  Melanoma is the most dangerous type of skin cancer, and is the leading cause of death from skin disease.  You are more likely to develop melanoma if you: Have light-colored skin, light-colored eyes, or red or blond hair Spend a lot of time in the sun Tan regularly, either outdoors or in a tanning bed Have had blistering sunburns, especially during childhood Have a close family member who has had a melanoma Have atypical moles or large birthmarks  Early detection of melanoma is key since treatment is typically straightforward and cure rates are extremely high if we catch it early.   The first sign of melanoma is often a change in a mole or a new dark spot.  The ABCDE system is a way of remembering the signs of melanoma.  A for asymmetry:  The two halves do not match. B for border:  The edges of the growth are irregular. C for color:  A mixture of colors are present instead of an even brown color. D for diameter:  Melanomas are usually (but not always) greater than 6mm - the size of a  pencil eraser. E for evolution:  The spot keeps changing in size, shape, and color.  Please check your skin once per month between visits. You can use a small mirror in front and a large mirror behind you to keep an eye on the back side or your body.   If you see any new or changing lesions before your next follow-up, please call to schedule a visit.  Please continue daily skin protection including broad spectrum sunscreen SPF 30+ to sun-exposed areas, reapplying every 2 hours as needed when you're outdoors.   Staying in the shade or wearing long sleeves, sun glasses (UVA+UVB protection) and wide brim hats (4-inch brim around the entire circumference of the hat) are also recommended for sun protection.      Due to recent changes in healthcare laws, you may see results of your pathology and/or laboratory studies on MyChart before the doctors have had a chance to review them. We understand that in some cases there may be results that are confusing or concerning to you. Please understand that not all results are received at the same time and often the doctors may need to interpret multiple results in order to provide you with the best plan of care or course of treatment. Therefore, we ask that you please give us  2 business days to thoroughly review all your results before contacting the office for clarification. Should we see a critical lab result, you will be contacted sooner.   If  You Need Anything After Your Visit  If you have any questions or concerns for your doctor, please call our main line at [phone] and press option 4 to reach your doctor's medical assistant. If no one answers, please leave a voicemail as directed and we will return your call as soon as possible. Messages left after 4 pm will be answered the following business day.   You may also send us  a message via MyChart. We typically respond to MyChart messages within 1-2 business days.  For prescription refills, please ask  your pharmacy to contact our office. Our fax number is [phone].  If you have an urgent issue when the clinic is closed that cannot wait until the next business day, you can page your doctor at the number below.    Please note that while we do our best to be available for urgent issues outside of office hours, we are not available 24/7.   If you have an urgent issue and are unable to reach us , you may choose to seek medical care at your doctor's office, retail clinic, urgent care center, or emergency room.  If you have a medical emergency, please immediately call 911 or go to the emergency department.  Pager Numbers  - Dr. Hester: [phone]  - Dr. Jackquline: [phone]  - Dr. Claudene: [phone]   - Dr. Raymund: [phone]  In the event of inclement weather, please call our main line at [phone] for an update on the status of any delays or closures.  Dermatology Medication Tips: Please keep the boxes that topical medications come in in order to help keep track of the instructions about where and how to use these. Pharmacies typically print the medication instructions only on the boxes and not directly on the medication tubes.   If your medication is too expensive, please contact our office at [phone] option 4 or send us  a message through MyChart.   We are unable to tell what your co-pay for medications will be in advance as this is different depending on your insurance coverage. However, we may be able to find a substitute medication at lower cost or fill out paperwork to get insurance to cover a needed medication.   If a prior authorization is required to get your medication covered by your insurance company, please allow us  1-2 business days to complete this process.  Drug prices often vary depending on where the prescription is filled and some pharmacies may offer cheaper prices.  The website www.goodrx.com contains coupons for medications through different  pharmacies. The prices here do not account for what the cost may be with help from insurance (it may be cheaper with your insurance), but the website can give you the price if you did not use any insurance.  - You can print the associated coupon and take it with your prescription to the pharmacy.  - You may also stop by our office during regular business hours and pick up a GoodRx coupon card.  - If you need your prescription sent electronically to a different pharmacy, notify our office through [facility] or by phone at [phone] option 4.     Si Usted Necesita Algo Despus de Su Visita  Tambin puede enviarnos un mensaje a travs de Clinical Cytogeneticist. Por lo general respondemos a los mensajes de MyChart en el transcurso de 1 a 2 das hbiles.  Para renovar recetas, por favor pida a su farmacia que se ponga en contacto con nuestra oficina. Nuestro nmero de fax  es el [phone].  Si tiene un asunto urgente cuando la clnica est cerrada y que no puede esperar hasta el siguiente da hbil, puede llamar/localizar a su doctor(a) al nmero que aparece a continuacin.   Por favor, tenga en cuenta que aunque hacemos todo lo posible para estar disponibles para asuntos urgentes fuera del horario de [place], no estamos disponibles las 24 horas del da, los 7 [address]  [address] de la [place].   Si tiene un problema urgente y no puede comunicarse con nosotros, puede optar por buscar atencin mdica  en el consultorio de su doctor(a), en una clnica privada, en un centro de atencin urgente o en una sala de emergencias.  Si tiene engineer, drilling, por favor llame inmediatamente al 911 o vaya a la sala de emergencias.  Nmeros de bper  - Dr. Hester: [phone]  - Dra. Jackquline: [phone]  - Dr. Claudene: [phone]  - Dra. Kitts: [phone]  En caso de inclemencias del [place], por favor llame a nuestra lnea principal al [phone] para una actualizacin sobre el estado de cualquier retraso o  cierre.  Consejos para la medicacin en dermatologa: Por favor, guarde las cajas en las que vienen los medicamentos de uso tpico para ayudarle a seguir las instrucciones sobre dnde y cmo usarlos. Las farmacias generalmente imprimen las instrucciones del medicamento slo en las cajas y no directamente en los tubos del [place].   Si su medicamento es muy caro, por favor, pngase en contacto con landry rieger llamando al [phone] y presione la opcin 4 o envenos un mensaje a travs de Clinical Cytogeneticist.   No podemos decirle cul ser su copago por los medicamentos por adelantado ya que esto es diferente dependiendo de la cobertura de su seguro. Sin embargo, es posible que podamos encontrar un medicamento sustituto a audiological scientist un formulario para que el seguro cubra el medicamento que se considera necesario.   Si se requiere una autorizacin previa para que su compaa de seguros cubra su medicamento, por favor permtanos de 1 a 2 das hbiles para completar este proceso.  Los precios de los medicamentos varan con frecuencia dependiendo del environmental consultant de dnde se surte la receta y alguna farmacias pueden ofrecer precios ms baratos.  El sitio web www.goodrx.com tiene cupones para medicamentos de health and safety inspector. Los precios aqu no tienen en cuenta lo que podra costar con la ayuda del seguro (puede ser ms barato con su seguro), pero el sitio web puede darle el precio si no utiliz tourist information centre manager.  - Puede imprimir el cupn correspondiente y llevarlo con su receta a la farmacia.  - Tambin puede pasar por nuestra oficina durante el horario de atencin regular y education officer, museum una tarjeta de cupones de GoodRx.  - Si necesita que su receta se enve electrnicamente a una farmacia diferente, informe a nuestra oficina a travs de MyChart de [place] o por telfono llamando al [phone] y presione la opcin 4.

## 2024-03-13 NOTE — Telephone Encounter (Signed)
 Pt's wife is requesting orders for wheelchair.

## 2024-03-13 NOTE — Progress Notes (Signed)
 Follow-Up Visit   Subjective  [name] [name] is a 78 y.o. male who presents for the following: Skin Cancer Screening and Upper Body Skin Exam. HxBCC, HxSCCs. HxAKs.  The patient presents for Upper Body Skin Exam (UBSE) for skin cancer screening and mole check. The patient has spots, moles and lesions to be evaluated, some may be new or changing and the patient may have concern these could be cancer.  Wife, [name] [name], is with patient and contributes to history.  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   The following portions of the chart were reviewed this encounter and updated as appropriate: medications, allergies, medical history  Review of Systems:  No other skin or systemic complaints except as noted in HPI or Assessment and Plan.  Objective  Well appearing patient in no apparent distress; mood and affect are within normal limits.  All skin waist up examined. Relevant physical exam findings are noted in the Assessment and Plan.    Assessment & Plan   MULTIPLE BENIGN NEVI   LENTIGINES   SEBORRHEIC KERATOSES   ACTINIC ELASTOSIS   CHERRY ANGIOMA   ACTINIC KERATOSES   SOLAR PURPURA    ACTINIC DAMAGE WITH PRECANCEROUS ACTINIC KERATOSES Counseling for Topical Chemotherapy Management: Patient exhibits: - Severe, confluent actinic changes with pre-cancerous actinic keratoses that is secondary to cumulative UV radiation exposure over time - Condition that is severe; chronic, not at goal. - diffuse scaly erythematous macules and papules with underlying dyspigmentation - Discussed Prescription Field Treatment topical Chemotherapy for Severe, Chronic Confluent Actinic Changes with Pre-Cancerous Actinic Keratoses Field treatment involves treatment of an entire area of skin that has confluent Actinic Changes (Sun/ Ultraviolet light damage) and PreCancerous Actinic Keratoses by method of PhotoDynamic Therapy (PDT) and/or prescription Topical Chemotherapy agents such as 5-fluorouracil,  5-fluorouracil/calcipotriene, and/or imiquimod.  The purpose is to decrease the number of clinically evident and subclinical PreCancerous lesions to prevent progression to development of skin cancer by chemically destroying early precancer changes that may or may not be visible.  It has been shown to reduce the risk of developing skin cancer in the treated area. As a result of treatment, redness, scaling, crusting, and open sores may occur during treatment course. One or more than one of these methods may be used and may have to be used several times to control, suppress and eliminate the PreCancerous changes. Discussed treatment course, expected reaction, and possible side effects. - Recommend daily broad spectrum sunscreen SPF 30+ to sun-exposed areas, reapply every 2 hours as needed.  - Staying in the shade or wearing long sleeves, sun glasses (UVA+UVB protection) and wide brim hats (4-inch brim around the entire circumference of the hat) are also recommended. - Call for new or changing lesions. - Start 5-fluorouracil cream twice a day to rough areas on  temples, cheeks, forehead, top of scalp, posterior crown of scalp until reaction occurs. Recommend spot treating one area at a time. Reviewed course of treatment and expected reaction.  Patient advised to expect inflammation and crusting and advised that erosions are possible.  Patient advised to be diligent with sun protection during and after treatment. Handout with details of how to apply medication and what to expect provided. Counseled to keep medication out of reach of children and pets.   Lentigines, Seborrheic Keratoses, Hemangiomas - Benign normal skin lesions - Benign-appearing - Call for any changes  Melanocytic Nevi - Tan-brown and/or pink-flesh-colored symmetric macules and papules - Benign appearing on exam today - Observation - Call clinic for new or  changing moles - Recommend daily use of broad spectrum spf 30+ sunscreen to  sun-exposed areas.   Purpura - Chronic; persistent and recurrent.  Treatable, but not curable. - Violaceous macules and patches - Benign - Related to trauma, age, sun damage and/or use of blood thinners, chronic use of topical and/or oral steroids - Observe - Can use OTC arnica containing moisturizer such as Dermend Bruise Formula if desired - Call for worsening or other concerns  HISTORY OF BASAL CELL CARCINOMA OF THE SKIN - No evidence of recurrence today - Recommend regular full body skin exams - Recommend daily broad spectrum sunscreen SPF 30+ to sun-exposed areas, reapply every 2 hours as needed.  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  - Call if any new or changing lesions are noted between office visits  Skin cancer screening performed today.   Return in about 6 months (around 09/10/2024) for AK Follow Up, UBSE.  I, Kate Fought, CMA, am acting as scribe for Boneta Sharps, MD.    Documentation: I have reviewed the above documentation for accuracy and completeness, and I agree with the above.  Boneta Sharps, MD

## 2024-03-14 NOTE — Telephone Encounter (Signed)
 Gloria left VM requesting an order for a small wheelchair be sent to [facility]. Order printed for Alan to sign and I will fax it.

## 2024-03-17 NOTE — Telephone Encounter (Signed)
 Patient wife was in office today for appt and she says  the patient needs a transfer chair not a wheel chair

## 2024-03-23 DIAGNOSIS — N186 End stage renal disease: Secondary | ICD-10-CM | POA: Diagnosis not present

## 2024-03-23 DIAGNOSIS — Z992 Dependence on renal dialysis: Secondary | ICD-10-CM | POA: Diagnosis not present

## 2024-03-31 DIAGNOSIS — G4733 Obstructive sleep apnea (adult) (pediatric): Secondary | ICD-10-CM | POA: Diagnosis not present

## 2024-03-31 DIAGNOSIS — Z9981 Dependence on supplemental oxygen: Secondary | ICD-10-CM | POA: Diagnosis not present

## 2024-03-31 DIAGNOSIS — J849 Interstitial pulmonary disease, unspecified: Secondary | ICD-10-CM | POA: Diagnosis not present

## 2024-03-31 DIAGNOSIS — R0600 Dyspnea, unspecified: Secondary | ICD-10-CM | POA: Diagnosis not present

## 2024-04-01 ENCOUNTER — Other Ambulatory Visit: Payer: Self-pay | Admitting: Specialist

## 2024-04-01 DIAGNOSIS — R0600 Dyspnea, unspecified: Secondary | ICD-10-CM

## 2024-04-01 DIAGNOSIS — J849 Interstitial pulmonary disease, unspecified: Secondary | ICD-10-CM

## 2024-04-08 ENCOUNTER — Other Ambulatory Visit

## 2024-04-08 ENCOUNTER — Ambulatory Visit: Admission: RE | Admit: 2024-04-08 | Discharge: 2024-04-08 | Attending: Specialist | Admitting: Specialist

## 2024-04-08 DIAGNOSIS — J849 Interstitial pulmonary disease, unspecified: Secondary | ICD-10-CM

## 2024-04-08 DIAGNOSIS — R0600 Dyspnea, unspecified: Secondary | ICD-10-CM | POA: Insufficient documentation

## 2024-04-22 ENCOUNTER — Ambulatory Visit: Admitting: Family

## 2024-04-23 ENCOUNTER — Encounter (INDEPENDENT_AMBULATORY_CARE_PROVIDER_SITE_OTHER): Payer: Self-pay | Admitting: Nurse Practitioner

## 2024-04-23 ENCOUNTER — Ambulatory Visit (INDEPENDENT_AMBULATORY_CARE_PROVIDER_SITE_OTHER): Admitting: Nurse Practitioner

## 2024-04-23 ENCOUNTER — Ambulatory Visit (INDEPENDENT_AMBULATORY_CARE_PROVIDER_SITE_OTHER)

## 2024-04-23 VITALS — BP 129/70 | HR 80 | Resp 16

## 2024-04-23 DIAGNOSIS — N186 End stage renal disease: Secondary | ICD-10-CM | POA: Diagnosis not present

## 2024-04-23 DIAGNOSIS — I10 Essential (primary) hypertension: Secondary | ICD-10-CM

## 2024-04-23 DIAGNOSIS — E782 Mixed hyperlipidemia: Secondary | ICD-10-CM

## 2024-04-27 ENCOUNTER — Encounter (INDEPENDENT_AMBULATORY_CARE_PROVIDER_SITE_OTHER): Payer: Self-pay | Admitting: Nurse Practitioner

## 2024-04-27 NOTE — Progress Notes (Signed)
 "  Subjective:    Patient ID: [name] [name], male    DOB: 11-06-1945, 79 y.o.   MRN: [id number] Chief Complaint  Patient presents with   Follow-up    Follow up HDA    HPI  Discussed the use of AI scribe software for clinical note transcription with the patient, who gave verbal consent to proceed.  History of Present Illness [name] [name] is an 79 year old male with end-stage renal disease on hemodialysis via left arm arteriovenous fistula who presents with repeated difficulties with fistula cannulation during dialysis.  He has experienced multiple recent episodes of unsuccessful cannulation of his left arm arteriovenous fistula during dialysis sessions, resulting in early termination of dialysis on two occasions. The most recent episode occurred either last Friday or two weeks ago, though the exact date is unclear. He has instructed staff to stop after multiple failed attempts to avoid further trauma.  He notes significant ecchymosis and discoloration at the fistula site, describing the area as black and blue. He reports that there is a specific segment of his arm where cannulation is consistently difficult, while another area is accessed successfully most of the time. He attempts to guide staff to the correct location, but they often miss.  He currently receives hemodialysis twice weekly, on Mondays and Fridays, reduced from three times per week due to insufficient fluid removal. He continues to produce urine, waking up four times each morning to void before 7 AM, and does not have difficulty with urination.  He describes persistent coldness in his hand and fingers, especially during dialysis, stating his hand stays freezing all the time. He has been performing hand exercises, such as squeezing a ball, which he believes has improved the fistula's appearance.    Results     Review of Systems     Objective:   Physical Exam  Physical  Exam CARDIOVASCULAR: Muffled sound on auscultation and pulsatile sensation on palpation of vascular area. EXTREMITIES: Bruising on extremity.  BP 129/70 (BP Location: Left Arm, Patient Position: Sitting, Cuff Size: Normal)   Pulse 80   Resp 16   Past Medical History:  Diagnosis Date   Accidental cut, puncture, perforation, or hemorrhage during heart catheterization 12/27/2012   Acute kidney injury superimposed on stage 4 chronic kidney disease (HCC) 07/27/2020   Acute on chronic combined systolic and diastolic CHF (congestive heart failure) (HCC) 04/25/2023   Acute on chronic respiratory failure with hypoxia (HCC) 04/22/2023   Adopted    AKI (acute kidney injury) 02/05/2023   ANA positive 07/12/2015   Aortic atherosclerosis    Aortic stenosis, severe    a.) s/p TAVR on 08/01/2016: 29 mm CoreValve Evolut placed   Arthritis    Bacteremia due to Streptococcus 04/24/2023   Basal cell carcinoma 01/04/2010   Right sup. post. helix. Excised 03/02/2010   BCC (basal cell carcinoma of skin) 11/24/2020   R neck below the ear, EDC   BPH (benign prostatic hyperplasia)    CAD (coronary artery disease) 12/26/2012   a.) LHC 12/26/2012: 50% mLAD, 80% mLCx, 100% pRCA --> CVTS at [facility] --> decided again revascularization due to insig LAD disease and the fact that RCA lesion was chronic   CAP (community acquired pneumonia) 07/26/2020   Chronic respiratory failure with hypoxia (HCC)    Claudication of both lower extremities    Complete tear of right rotator cuff 11/15/2015   Complex tear of lateral meniscus of left knee as current injury 12/06/2020   Complex  tear of medial meniscus of left knee as current injury 10/18/2020   COPD (chronic obstructive pulmonary disease) (HCC)    Diarrhea 02/04/2023   Diet-controlled type 2 diabetes mellitus (HCC)    Diverticulosis    Dyspnea    Electrolyte abnormality 02/04/2023   ESRD (end stage renal disease) (HCC)    GERD (gastroesophageal reflux disease)     Gout    Heart murmur    HFrEF (heart failure with reduced ejection fraction) (HCC)    a.) s/p TAVR on 08/01/2016: 29 mm CoreValve Evolut placed   History of bilateral cataract extraction    History of hiatal hernia    HLD (hyperlipidemia)    Hyperkalemia 07/30/2020   Hypertension    Hypomagnesemia 02/05/2023   Hypothyroidism    IDA (iron deficiency anemia)    Migraines    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   a.) TTE 04/24/2023: moderate (MPG 7.5 mmHG)   OSA on CPAP    Pneumonia 07/2020   Pulmonary hypertension (HCC)    a.) TTE 08/17/2022: mild pHTN per echo report; b.) TTE 04/24/2023: RVSP 39.1   Recurrent nephrolithiasis 12/27/2012   Rotator cuff tendinitis, right 11/15/2015   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 MOHS.   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/[place])     Social History   Socioeconomic History   Marital status: Married    Spouse name: Terrion Gencarelli   Number of children: 2   Years of education: Not on file   Highest education level: Not on file  Occupational History   Occupation: retired  Tobacco Use   Smoking status: Former    Current packs/day: 0.00    Average packs/day: 2.0 packs/day for 54.0 years (108.0 ttl pk-yrs)    Types: Cigarettes    Start date: 61    Quit date: 2010    Years since quitting: 16.0   Smokeless tobacco: Former    Types: Chew    Quit date: 05/16/2004  Vaping Use   Vaping status: Never Used  Substance and Sexual Activity   Alcohol use: Yes    Comment: rare beer    Drug use: Never   Sexual activity: Not on file  Other Topics Concern   Not on file  Social History Narrative   Not on file   Social Drivers of Health   Tobacco Use: Medium Risk (04/23/2024)   Patient History    Smoking Tobacco Use: Former    Smokeless Tobacco Use: Former    Passive Exposure: Not on Actuary Strain: Low Risk (08/27/2023)   Overall Financial Resource Strain (CARDIA)    Difficulty of Paying Living Expenses: Not very hard  Recent Concern: Financial Resource Strain - High Risk (07/24/2023)   Received from [facility]   Overall Financial Resource Strain (CARDIA)    Difficulty of Paying Living Expenses: Hard  Food Insecurity: No Food Insecurity (08/28/2023)   Hunger Vital Sign    Worried About Running Out of Food in the Last Year: Never true  Ran Out of Food in the Last Year: Never true  Recent Concern: Food Insecurity - Food Insecurity Present (07/12/2023)   Received from [facility]   Hunger Vital Sign    Worried About Running Out of Food in the Last Year: Sometimes true    Ran Out of Food in the Last Year: Sometimes true  Transportation Needs: No Transportation Needs (08/28/2023)   PRAPARE - Administrator, Civil Service (Medical): No    Lack of Transportation (Non-Medical): No  Physical Activity: Inactive (07/18/2023)   Received from [facility]   Exercise Vital Sign    On average, how many days per week do you engage in moderate to strenuous exercise (like a brisk walk)?: 0 days    On average, how many minutes do you engage in exercise at this level?: 0 min  Stress: No Stress Concern Present (08/27/2023)   [employer] of Occupational Health - Occupational Stress Questionnaire    Feeling of Stress : Only a little  Recent Concern: Stress - Stress Concern Present (07/18/2023)   Received from [facility] of Occupational Health - Occupational  Stress Questionnaire    Feeling of Stress : To some extent  Social Connections: Socially Integrated (08/28/2023)   Social Connection and Isolation Panel    Frequency of Communication with Friends and Family: More than three times a week    Frequency of Social Gatherings with Friends and Family: More than three times a week    Attends Religious Services: More than 4 times per year    Active Member of [employer] or Organizations: Yes    Attends Banker Meetings: Never    Marital Status: Married  Catering Manager Violence: Not At Risk (08/28/2023)   Humiliation, Afraid, Rape, and Kick questionnaire    Fear of Current or Ex-Partner: No    Emotionally Abused: No    Physically Abused: No    Sexually Abused: No  Depression (PHQ2-9): Medium Risk (01/10/2024)   Depression (PHQ2-9)    PHQ-2 Score: 7  Alcohol Screen: Low Risk (03/18/2023)   Alcohol Screen    Last Alcohol Screening Score (AUDIT): 1  Housing: Low Risk (08/28/2023)   Housing Stability Vital Sign    Unable to Pay for Housing in the Last Year: No    Number of Times Moved in the Last Year: 0    Homeless in the Last Year: No  Utilities: Not At Risk (08/28/2023)   AHC Utilities    Threatened with loss of utilities: No  Health Literacy: Adequate Health Literacy (08/27/2023)   B1300 Health Literacy    Frequency of need for help with medical instructions: Rarely    Past Surgical History:  Procedure Laterality Date   AORTIC VALVE REPLACEMENT N/A 08/01/2016   29 mm CoreValve Evolut; Location: [facility]; Surgeon: Reyes Fruits, MD   AV FISTULA PLACEMENT Right 06/27/2023   Procedure: ARTERIOVENOUS (AV) FISTULA CREATION (BRACHIALCEPHALIC);  Surgeon: Jama Cordella MATSU, MD;  Location: [facility];  Service: Vascular;  Laterality: Right;   CARDIAC CATHETERIZATION N/A 12/26/2012   2v CAD; retained pigtail in myocardium --> transferred to [facility]; Location: [facility]; Surgeon: Vita Bathe, MD   CATARACT EXTRACTION W/PHACO Left 10/03/2022   Procedure:  CATARACT EXTRACTION PHACO AND INTRAOCULAR LENS PLACEMENT (IOC) LEFT DIABETIC 8.22 00:55.5;  Surgeon: Jaye Fallow, MD;  Location: [facility];  Service: Ophthalmology;  Laterality: Left;  sleep apnea   CATARACT EXTRACTION W/PHACO Right 10/17/2022  Procedure: CATARACT EXTRACTION PHACO AND INTRAOCULAR LENS PLACEMENT (IOC) RIGHT DIABETIC;  Surgeon: Jaye Fallow, MD;  Location: [facility];  Service: Ophthalmology;  Laterality: Right;  4.79 0:37.4   COLONOSCOPY     COLONOSCOPY N/A 04/28/2023   Procedure: COLONOSCOPY;  Surgeon: Toledo, Ladell POUR, MD;  Location: [facility];  Service: Gastroenterology;  Laterality: N/A;   DIALYSIS/PERMA CATHETER INSERTION N/A 08/29/2023   Procedure: DIALYSIS/PERMA CATHETER INSERTION;  Surgeon: Jama Cordella MATSU, MD;  Location: [facility];  Service: Cardiovascular;  Laterality: N/A;   DIALYSIS/PERMA CATHETER REMOVAL N/A 11/12/2023   Procedure: DIALYSIS/PERMA CATHETER REMOVAL;  Surgeon: Marea Selinda RAMAN, MD;  Location: [facility];  Service: Cardiovascular;  Laterality: N/A;   KNEE ARTHROSCOPY WITH MEDIAL MENISECTOMY Left 12/02/2020   Procedure: KNEE ARTHROSCOPY WITH DEBRIDEMENT AND PARTIAL MEDIAL AND LATERAL MENISECTOMY;  Surgeon: Edie Norleen PARAS, MD;  Location: [facility];  Service: Orthopedics;  Laterality: Left;   LITHOTRIPSY     PERCUTANEOUS REMOVAL INTRA-AORTIC BALLOON CATH N/A 12/26/2012   Procedure: PERCUTANEOUS REMOVAL INTRA-AORTIC BALLOON CATH; Surgeon: Lang Lannie Salter, MD; Location: [facility]; Service: Cardiothoracic   POLYPECTOMY  04/28/2023   Procedure: POLYPECTOMY;  Surgeon: Aundria, Ladell POUR, MD;  Location: [facility];  Service: Gastroenterology;;   RIGHT HEART CATH Right 07/13/2016   Location: [facility]; Surgeon: Ozell Estelle, MD   TEE WITHOUT CARDIOVERSION Right 04/26/2023   Procedure: TRANSESOPHAGEAL ECHOCARDIOGRAM (TEE);  Surgeon: Fernand Denyse LABOR, MD;  Location: [facility];  Service: Cardiovascular;   Laterality: Right;   TRANSESOPHAGEAL ECHOCARDIOGRAM N/A 12/26/2012   Procedure: TRANSESOPHAGEAL ECHOCARDIOGRAPHY; Surgeon: Lang Lannie Salter, MD; Location: [facility]; Service: Cardiothoracic   UMBILICAL HERNIA REPAIR N/A 02/13/2014   Procedure: LAPAROSCOPIC UMBILICAL HERNIA REPAIR; Surgeon: Shanda ONEIDA Salter, MD; Location: [facility]; Service: General Surgery    Family History  Adopted: Yes  Problem Relation Age of Onset   Varicose Veins Daughter    Obesity Daughter    Hyperlipidemia Daughter    Diabetes Daughter    COPD Daughter    Depression Daughter    Asthma Daughter    Arthritis Son    Diabetes Son    Hypertension Son     Allergies[1]     Latest Ref Rng & Units 01/16/2024   10:20 AM 12/25/2023   12:37 PM 09/20/2023   10:03 AM  CBC  WBC 3.4 - 10.8 x10E3/uL 5.8  7.5  7.8   Hemoglobin 13.0 - 17.7 g/dL 89.6  89.4  89.2   Hematocrit 37.5 - 51.0 % 31.7  32.8  32.9   Platelets x10E3/uL CANCELED  239  139       CMP     Component Value Date/Time   NA 143 01/16/2024 1020   NA 138 07/27/2014 1247   K 4.7 01/16/2024 1020   K 4.2 07/27/2014 1247   CL 101 01/16/2024 1020   CL 105 07/27/2014 1247   CO2 22 01/16/2024 1020   CO2 26 07/27/2014 1247   GLUCOSE 107 (H) 01/16/2024 1020   GLUCOSE 100 (H) 12/25/2023 1237   GLUCOSE 135 (H) 07/27/2014 1247   BUN 40 (H) 01/16/2024 1020   BUN 28 (H) 07/27/2014 1247   CREATININE 4.10 (H) 01/16/2024 1020   CREATININE 1.64 (H) 07/27/2014 1247   CALCIUM 9.2 01/16/2024 1020   CALCIUM 8.7 (L) 07/27/2014 1247   PROT 6.8 01/16/2024 1020   PROT 6.9 07/27/2014 1247   ALBUMIN 3.7 (L) 01/16/2024 1020   ALBUMIN 3.6 07/27/2014 1247   AST 14  01/16/2024 1020   AST 21 07/27/2014 1247   ALT 9 01/16/2024 1020   ALT 17 07/27/2014 1247   ALKPHOS 98 01/16/2024 1020   ALKPHOS 69 07/27/2014 1247   BILITOT 0.4 01/16/2024 1020   BILITOT 0.7 07/27/2014 1247   EGFR 14 (L) 01/16/2024 1020   GFRNONAA 17 (L) 12/25/2023 1237   GFRNONAA  42 (L) 07/27/2014 1247     No results found.     Assessment & Plan:   1. ESRD (end stage renal disease) (HCC) (Primary) Recommend:  The patient is experiencing increasing problems with their dialysis access.  Patient should have a fistulagram with the intention for intervention.  The intention for intervention is to restore appropriate flow and prevent thrombosis and possible loss of the access.  As well as improve the quality of dialysis therapy.  The risks, benefits and alternative therapies were reviewed in detail with the patient.  All questions were answered.  The patient agrees to proceed with angio/intervention.    The patient will follow up with me in the office after the procedure.   2. Essential hypertension Continue antihypertensive medications as already ordered, these medications have been reviewed and there are no changes at this time.  3. Mixed hyperlipidemia Continue repatha as ordered and reviewed, no changes at this time    Current Outpatient Medications on File Prior to Visit  Medication Sig Dispense Refill   acetaminophen  (TYLENOL ) 650 MG CR tablet Take 1,300 mg by mouth every 8 (eight) hours as needed for pain.     albuterol  (VENTOLIN  HFA) 108 (90 Base) MCG/ACT inhaler Inhale 2 puffs into the lungs every 6 (six) hours as needed for wheezing or shortness of breath.     Alcohol Swabs (DROPSAFE ALCOHOL PREP) 70 % PADS Apply 1 each topically as needed (With repatha and blood sugar checks.).     Ascorbic Acid  (VITAMIN C ) 1000 MG tablet Take 1,000 mg by mouth at bedtime.     aspirin  EC 81 MG tablet Take 81 mg by mouth daily. Swallow whole.     busPIRone  (BUSPAR ) 5 MG tablet Take 1 tablet (5 mg total) by mouth at bedtime. 90 tablet 1   calcitRIOL  (ROCALTROL ) 0.25 MCG capsule Take 0.25 mcg by mouth daily.     citalopram  (CELEXA ) 40 MG tablet Take 1 tablet (40 mg total) by mouth daily. 90 tablet 3   Evolocumab (REPATHA SURECLICK) 140 MG/ML SOAJ INJECT CONTENT OF 1  CARTRIDGE UNDER THE SKIN EVERY OTHER WEEK 2 mL 29   ezetimibe  (ZETIA ) 10 MG tablet TAKE 1 TABLET BY MOUTH EVERYDAY AT BEDTIME 90 tablet 1   febuxostat  (ULORIC ) 40 MG tablet Take 40 mg by mouth at bedtime.     fexofenadine (ALLEGRA) 180 MG tablet Take 180 mg by mouth daily as needed for allergies or rhinitis.     fluorouracil  (EFUDEX ) 5 % cream Apply twice a day to rough areas on  temples, cheeks, forehead, top of scalp/head, crown of scalp until reaction occurs 40 g 2   Fluticasone -Umeclidin-Vilant (TRELEGY ELLIPTA ) 100-62.5-25 MCG/ACT AEPB Inhale 1 Inhalation into the lungs daily at 6 (six) AM. 60 each 11   MAGNESIUM  OXIDE 400 PO Take by mouth Nightly.     metolazone (ZAROXOLYN) 5 MG tablet Take 5 mg by mouth once a week.     metoprolol  succinate (TOPROL -XL) 50 MG 24 hr tablet TAKE 1 TABLET EVERY DAY 90 tablet 3   Multiple Vitamins-Minerals (PRESERVISION AREDS) CAPS Take 1 capsule by mouth 2 (two) times daily.  OHTUVAYRE  3 MG/2.5ML SUSP Inhale 3 mg into the lungs.     omeprazole  (PRILOSEC) 40 MG capsule Take 1 capsule (40 mg total) by mouth daily. 30 capsule 3   OXYGEN  Inhale 2-3 L into the lungs See admin instructions. Used as needed throughout the day and continuous at bedtime     pantoprazole  (PROTONIX ) 40 MG tablet      SUPER B COMPLEX/C PO Take 1 tablet by mouth daily.     torsemide  (DEMADEX ) 20 MG tablet Take 20 mg by mouth daily.     No current facility-administered medications on file prior to visit.    There are no Patient Instructions on file for this visit. No follow-ups on file.   Camaron Cammack E Laurin Morgenstern, NP      [1]  Allergies Allergen Reactions   Nsaids Other (See Comments)    Avoid due to kidney disease    Statins Other (See Comments)    Myalgias   Nexletol [Bempedoic Acid] Diarrhea    fatigue   Pedi-Pre Tape Spray [Wound Dressing Adhesive]    Roflumilast     Silver Other (See Comments)   Tegaderm High Gelling Alginate [Wound Dressings] Rash   "

## 2024-04-27 NOTE — H&P (View-Only) (Signed)
 "  Subjective:    Patient ID: [name] [name], male    DOB: 11-06-1945, 79 y.o.   MRN: [id number] Chief Complaint  Patient presents with   Follow-up    Follow up HDA    HPI  Discussed the use of AI scribe software for clinical note transcription with the patient, who gave verbal consent to proceed.  History of Present Illness [name] [name] is an 79 year old male with end-stage renal disease on hemodialysis via left arm arteriovenous fistula who presents with repeated difficulties with fistula cannulation during dialysis.  He has experienced multiple recent episodes of unsuccessful cannulation of his left arm arteriovenous fistula during dialysis sessions, resulting in early termination of dialysis on two occasions. The most recent episode occurred either last Friday or two weeks ago, though the exact date is unclear. He has instructed staff to stop after multiple failed attempts to avoid further trauma.  He notes significant ecchymosis and discoloration at the fistula site, describing the area as black and blue. He reports that there is a specific segment of his arm where cannulation is consistently difficult, while another area is accessed successfully most of the time. He attempts to guide staff to the correct location, but they often miss.  He currently receives hemodialysis twice weekly, on Mondays and Fridays, reduced from three times per week due to insufficient fluid removal. He continues to produce urine, waking up four times each morning to void before 7 AM, and does not have difficulty with urination.  He describes persistent coldness in his hand and fingers, especially during dialysis, stating his hand stays freezing all the time. He has been performing hand exercises, such as squeezing a ball, which he believes has improved the fistula's appearance.    Results     Review of Systems     Objective:   Physical Exam  Physical  Exam CARDIOVASCULAR: Muffled sound on auscultation and pulsatile sensation on palpation of vascular area. EXTREMITIES: Bruising on extremity.  BP 129/70 (BP Location: Left Arm, Patient Position: Sitting, Cuff Size: Normal)   Pulse 80   Resp 16   Past Medical History:  Diagnosis Date   Accidental cut, puncture, perforation, or hemorrhage during heart catheterization 12/27/2012   Acute kidney injury superimposed on stage 4 chronic kidney disease (HCC) 07/27/2020   Acute on chronic combined systolic and diastolic CHF (congestive heart failure) (HCC) 04/25/2023   Acute on chronic respiratory failure with hypoxia (HCC) 04/22/2023   Adopted    AKI (acute kidney injury) 02/05/2023   ANA positive 07/12/2015   Aortic atherosclerosis    Aortic stenosis, severe    a.) s/p TAVR on 08/01/2016: 29 mm CoreValve Evolut placed   Arthritis    Bacteremia due to Streptococcus 04/24/2023   Basal cell carcinoma 01/04/2010   Right sup. post. helix. Excised 03/02/2010   BCC (basal cell carcinoma of skin) 11/24/2020   R neck below the ear, EDC   BPH (benign prostatic hyperplasia)    CAD (coronary artery disease) 12/26/2012   a.) LHC 12/26/2012: 50% mLAD, 80% mLCx, 100% pRCA --> CVTS at [facility] --> decided again revascularization due to insig LAD disease and the fact that RCA lesion was chronic   CAP (community acquired pneumonia) 07/26/2020   Chronic respiratory failure with hypoxia (HCC)    Claudication of both lower extremities    Complete tear of right rotator cuff 11/15/2015   Complex tear of lateral meniscus of left knee as current injury 12/06/2020   Complex  tear of medial meniscus of left knee as current injury 10/18/2020   COPD (chronic obstructive pulmonary disease) (HCC)    Diarrhea 02/04/2023   Diet-controlled type 2 diabetes mellitus (HCC)    Diverticulosis    Dyspnea    Electrolyte abnormality 02/04/2023   ESRD (end stage renal disease) (HCC)    GERD (gastroesophageal reflux disease)     Gout    Heart murmur    HFrEF (heart failure with reduced ejection fraction) (HCC)    a.) s/p TAVR on 08/01/2016: 29 mm CoreValve Evolut placed   History of bilateral cataract extraction    History of hiatal hernia    HLD (hyperlipidemia)    Hyperkalemia 07/30/2020   Hypertension    Hypomagnesemia 02/05/2023   Hypothyroidism    IDA (iron deficiency anemia)    Migraines    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   a.) TTE 04/24/2023: moderate (MPG 7.5 mmHG)   OSA on CPAP    Pneumonia 07/2020   Pulmonary hypertension (HCC)    a.) TTE 08/17/2022: mild pHTN per echo report; b.) TTE 04/24/2023: RVSP 39.1   Recurrent nephrolithiasis 12/27/2012   Rotator cuff tendinitis, right 11/15/2015   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 MOHS.   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/[place])     Social History   Socioeconomic History   Marital status: Married    Spouse name: Terrion Gencarelli   Number of children: 2   Years of education: Not on file   Highest education level: Not on file  Occupational History   Occupation: retired  Tobacco Use   Smoking status: Former    Current packs/day: 0.00    Average packs/day: 2.0 packs/day for 54.0 years (108.0 ttl pk-yrs)    Types: Cigarettes    Start date: 61    Quit date: 2010    Years since quitting: 16.0   Smokeless tobacco: Former    Types: Chew    Quit date: 05/16/2004  Vaping Use   Vaping status: Never Used  Substance and Sexual Activity   Alcohol use: Yes    Comment: rare beer    Drug use: Never   Sexual activity: Not on file  Other Topics Concern   Not on file  Social History Narrative   Not on file   Social Drivers of Health   Tobacco Use: Medium Risk (04/23/2024)   Patient History    Smoking Tobacco Use: Former    Smokeless Tobacco Use: Former    Passive Exposure: Not on Actuary Strain: Low Risk (08/27/2023)   Overall Financial Resource Strain (CARDIA)    Difficulty of Paying Living Expenses: Not very hard  Recent Concern: Financial Resource Strain - High Risk (07/24/2023)   Received from [facility]   Overall Financial Resource Strain (CARDIA)    Difficulty of Paying Living Expenses: Hard  Food Insecurity: No Food Insecurity (08/28/2023)   Hunger Vital Sign    Worried About Running Out of Food in the Last Year: Never true  Ran Out of Food in the Last Year: Never true  Recent Concern: Food Insecurity - Food Insecurity Present (07/12/2023)   Received from [facility]   Hunger Vital Sign    Worried About Running Out of Food in the Last Year: Sometimes true    Ran Out of Food in the Last Year: Sometimes true  Transportation Needs: No Transportation Needs (08/28/2023)   PRAPARE - Administrator, Civil Service (Medical): No    Lack of Transportation (Non-Medical): No  Physical Activity: Inactive (07/18/2023)   Received from [facility]   Exercise Vital Sign    On average, how many days per week do you engage in moderate to strenuous exercise (like a brisk walk)?: 0 days    On average, how many minutes do you engage in exercise at this level?: 0 min  Stress: No Stress Concern Present (08/27/2023)   [employer] of Occupational Health - Occupational Stress Questionnaire    Feeling of Stress : Only a little  Recent Concern: Stress - Stress Concern Present (07/18/2023)   Received from [facility] of Occupational Health - Occupational  Stress Questionnaire    Feeling of Stress : To some extent  Social Connections: Socially Integrated (08/28/2023)   Social Connection and Isolation Panel    Frequency of Communication with Friends and Family: More than three times a week    Frequency of Social Gatherings with Friends and Family: More than three times a week    Attends Religious Services: More than 4 times per year    Active Member of [employer] or Organizations: Yes    Attends Banker Meetings: Never    Marital Status: Married  Catering Manager Violence: Not At Risk (08/28/2023)   Humiliation, Afraid, Rape, and Kick questionnaire    Fear of Current or Ex-Partner: No    Emotionally Abused: No    Physically Abused: No    Sexually Abused: No  Depression (PHQ2-9): Medium Risk (01/10/2024)   Depression (PHQ2-9)    PHQ-2 Score: 7  Alcohol Screen: Low Risk (03/18/2023)   Alcohol Screen    Last Alcohol Screening Score (AUDIT): 1  Housing: Low Risk (08/28/2023)   Housing Stability Vital Sign    Unable to Pay for Housing in the Last Year: No    Number of Times Moved in the Last Year: 0    Homeless in the Last Year: No  Utilities: Not At Risk (08/28/2023)   AHC Utilities    Threatened with loss of utilities: No  Health Literacy: Adequate Health Literacy (08/27/2023)   B1300 Health Literacy    Frequency of need for help with medical instructions: Rarely    Past Surgical History:  Procedure Laterality Date   AORTIC VALVE REPLACEMENT N/A 08/01/2016   29 mm CoreValve Evolut; Location: [facility]; Surgeon: Reyes Fruits, MD   AV FISTULA PLACEMENT Right 06/27/2023   Procedure: ARTERIOVENOUS (AV) FISTULA CREATION (BRACHIALCEPHALIC);  Surgeon: Jama Cordella MATSU, MD;  Location: [facility];  Service: Vascular;  Laterality: Right;   CARDIAC CATHETERIZATION N/A 12/26/2012   2v CAD; retained pigtail in myocardium --> transferred to [facility]; Location: [facility]; Surgeon: Vita Bathe, MD   CATARACT EXTRACTION W/PHACO Left 10/03/2022   Procedure:  CATARACT EXTRACTION PHACO AND INTRAOCULAR LENS PLACEMENT (IOC) LEFT DIABETIC 8.22 00:55.5;  Surgeon: Jaye Fallow, MD;  Location: [facility];  Service: Ophthalmology;  Laterality: Left;  sleep apnea   CATARACT EXTRACTION W/PHACO Right 10/17/2022  Procedure: CATARACT EXTRACTION PHACO AND INTRAOCULAR LENS PLACEMENT (IOC) RIGHT DIABETIC;  Surgeon: Jaye Fallow, MD;  Location: [facility];  Service: Ophthalmology;  Laterality: Right;  4.79 0:37.4   COLONOSCOPY     COLONOSCOPY N/A 04/28/2023   Procedure: COLONOSCOPY;  Surgeon: Toledo, Ladell POUR, MD;  Location: [facility];  Service: Gastroenterology;  Laterality: N/A;   DIALYSIS/PERMA CATHETER INSERTION N/A 08/29/2023   Procedure: DIALYSIS/PERMA CATHETER INSERTION;  Surgeon: Jama Cordella MATSU, MD;  Location: [facility];  Service: Cardiovascular;  Laterality: N/A;   DIALYSIS/PERMA CATHETER REMOVAL N/A 11/12/2023   Procedure: DIALYSIS/PERMA CATHETER REMOVAL;  Surgeon: Marea Selinda RAMAN, MD;  Location: [facility];  Service: Cardiovascular;  Laterality: N/A;   KNEE ARTHROSCOPY WITH MEDIAL MENISECTOMY Left 12/02/2020   Procedure: KNEE ARTHROSCOPY WITH DEBRIDEMENT AND PARTIAL MEDIAL AND LATERAL MENISECTOMY;  Surgeon: Edie Norleen PARAS, MD;  Location: [facility];  Service: Orthopedics;  Laterality: Left;   LITHOTRIPSY     PERCUTANEOUS REMOVAL INTRA-AORTIC BALLOON CATH N/A 12/26/2012   Procedure: PERCUTANEOUS REMOVAL INTRA-AORTIC BALLOON CATH; Surgeon: Lang Lannie Salter, MD; Location: [facility]; Service: Cardiothoracic   POLYPECTOMY  04/28/2023   Procedure: POLYPECTOMY;  Surgeon: Aundria, Ladell POUR, MD;  Location: [facility];  Service: Gastroenterology;;   RIGHT HEART CATH Right 07/13/2016   Location: [facility]; Surgeon: Ozell Estelle, MD   TEE WITHOUT CARDIOVERSION Right 04/26/2023   Procedure: TRANSESOPHAGEAL ECHOCARDIOGRAM (TEE);  Surgeon: Fernand Denyse LABOR, MD;  Location: [facility];  Service: Cardiovascular;   Laterality: Right;   TRANSESOPHAGEAL ECHOCARDIOGRAM N/A 12/26/2012   Procedure: TRANSESOPHAGEAL ECHOCARDIOGRAPHY; Surgeon: Lang Lannie Salter, MD; Location: [facility]; Service: Cardiothoracic   UMBILICAL HERNIA REPAIR N/A 02/13/2014   Procedure: LAPAROSCOPIC UMBILICAL HERNIA REPAIR; Surgeon: Shanda ONEIDA Salter, MD; Location: [facility]; Service: General Surgery    Family History  Adopted: Yes  Problem Relation Age of Onset   Varicose Veins Daughter    Obesity Daughter    Hyperlipidemia Daughter    Diabetes Daughter    COPD Daughter    Depression Daughter    Asthma Daughter    Arthritis Son    Diabetes Son    Hypertension Son     Allergies[1]     Latest Ref Rng & Units 01/16/2024   10:20 AM 12/25/2023   12:37 PM 09/20/2023   10:03 AM  CBC  WBC 3.4 - 10.8 x10E3/uL 5.8  7.5  7.8   Hemoglobin 13.0 - 17.7 g/dL 89.6  89.4  89.2   Hematocrit 37.5 - 51.0 % 31.7  32.8  32.9   Platelets x10E3/uL CANCELED  239  139       CMP     Component Value Date/Time   NA 143 01/16/2024 1020   NA 138 07/27/2014 1247   K 4.7 01/16/2024 1020   K 4.2 07/27/2014 1247   CL 101 01/16/2024 1020   CL 105 07/27/2014 1247   CO2 22 01/16/2024 1020   CO2 26 07/27/2014 1247   GLUCOSE 107 (H) 01/16/2024 1020   GLUCOSE 100 (H) 12/25/2023 1237   GLUCOSE 135 (H) 07/27/2014 1247   BUN 40 (H) 01/16/2024 1020   BUN 28 (H) 07/27/2014 1247   CREATININE 4.10 (H) 01/16/2024 1020   CREATININE 1.64 (H) 07/27/2014 1247   CALCIUM 9.2 01/16/2024 1020   CALCIUM 8.7 (L) 07/27/2014 1247   PROT 6.8 01/16/2024 1020   PROT 6.9 07/27/2014 1247   ALBUMIN 3.7 (L) 01/16/2024 1020   ALBUMIN 3.6 07/27/2014 1247   AST 14  01/16/2024 1020   AST 21 07/27/2014 1247   ALT 9 01/16/2024 1020   ALT 17 07/27/2014 1247   ALKPHOS 98 01/16/2024 1020   ALKPHOS 69 07/27/2014 1247   BILITOT 0.4 01/16/2024 1020   BILITOT 0.7 07/27/2014 1247   EGFR 14 (L) 01/16/2024 1020   GFRNONAA 17 (L) 12/25/2023 1237   GFRNONAA  42 (L) 07/27/2014 1247     No results found.     Assessment & Plan:   1. ESRD (end stage renal disease) (HCC) (Primary) Recommend:  The patient is experiencing increasing problems with their dialysis access.  Patient should have a fistulagram with the intention for intervention.  The intention for intervention is to restore appropriate flow and prevent thrombosis and possible loss of the access.  As well as improve the quality of dialysis therapy.  The risks, benefits and alternative therapies were reviewed in detail with the patient.  All questions were answered.  The patient agrees to proceed with angio/intervention.    The patient will follow up with me in the office after the procedure.   2. Essential hypertension Continue antihypertensive medications as already ordered, these medications have been reviewed and there are no changes at this time.  3. Mixed hyperlipidemia Continue repatha as ordered and reviewed, no changes at this time    Current Outpatient Medications on File Prior to Visit  Medication Sig Dispense Refill   acetaminophen  (TYLENOL ) 650 MG CR tablet Take 1,300 mg by mouth every 8 (eight) hours as needed for pain.     albuterol  (VENTOLIN  HFA) 108 (90 Base) MCG/ACT inhaler Inhale 2 puffs into the lungs every 6 (six) hours as needed for wheezing or shortness of breath.     Alcohol Swabs (DROPSAFE ALCOHOL PREP) 70 % PADS Apply 1 each topically as needed (With repatha and blood sugar checks.).     Ascorbic Acid  (VITAMIN C ) 1000 MG tablet Take 1,000 mg by mouth at bedtime.     aspirin  EC 81 MG tablet Take 81 mg by mouth daily. Swallow whole.     busPIRone  (BUSPAR ) 5 MG tablet Take 1 tablet (5 mg total) by mouth at bedtime. 90 tablet 1   calcitRIOL  (ROCALTROL ) 0.25 MCG capsule Take 0.25 mcg by mouth daily.     citalopram  (CELEXA ) 40 MG tablet Take 1 tablet (40 mg total) by mouth daily. 90 tablet 3   Evolocumab (REPATHA SURECLICK) 140 MG/ML SOAJ INJECT CONTENT OF 1  CARTRIDGE UNDER THE SKIN EVERY OTHER WEEK 2 mL 29   ezetimibe  (ZETIA ) 10 MG tablet TAKE 1 TABLET BY MOUTH EVERYDAY AT BEDTIME 90 tablet 1   febuxostat  (ULORIC ) 40 MG tablet Take 40 mg by mouth at bedtime.     fexofenadine (ALLEGRA) 180 MG tablet Take 180 mg by mouth daily as needed for allergies or rhinitis.     fluorouracil  (EFUDEX ) 5 % cream Apply twice a day to rough areas on  temples, cheeks, forehead, top of scalp/head, crown of scalp until reaction occurs 40 g 2   Fluticasone -Umeclidin-Vilant (TRELEGY ELLIPTA ) 100-62.5-25 MCG/ACT AEPB Inhale 1 Inhalation into the lungs daily at 6 (six) AM. 60 each 11   MAGNESIUM  OXIDE 400 PO Take by mouth Nightly.     metolazone (ZAROXOLYN) 5 MG tablet Take 5 mg by mouth once a week.     metoprolol  succinate (TOPROL -XL) 50 MG 24 hr tablet TAKE 1 TABLET EVERY DAY 90 tablet 3   Multiple Vitamins-Minerals (PRESERVISION AREDS) CAPS Take 1 capsule by mouth 2 (two) times daily.  OHTUVAYRE  3 MG/2.5ML SUSP Inhale 3 mg into the lungs.     omeprazole  (PRILOSEC) 40 MG capsule Take 1 capsule (40 mg total) by mouth daily. 30 capsule 3   OXYGEN  Inhale 2-3 L into the lungs See admin instructions. Used as needed throughout the day and continuous at bedtime     pantoprazole  (PROTONIX ) 40 MG tablet      SUPER B COMPLEX/C PO Take 1 tablet by mouth daily.     torsemide  (DEMADEX ) 20 MG tablet Take 20 mg by mouth daily.     No current facility-administered medications on file prior to visit.    There are no Patient Instructions on file for this visit. No follow-ups on file.   Camaron Cammack E Laurin Morgenstern, NP      [1]  Allergies Allergen Reactions   Nsaids Other (See Comments)    Avoid due to kidney disease    Statins Other (See Comments)    Myalgias   Nexletol [Bempedoic Acid] Diarrhea    fatigue   Pedi-Pre Tape Spray [Wound Dressing Adhesive]    Roflumilast     Silver Other (See Comments)   Tegaderm High Gelling Alginate [Wound Dressings] Rash   "

## 2024-04-29 ENCOUNTER — Ambulatory Visit: Admitting: Cardiovascular Disease

## 2024-04-29 ENCOUNTER — Telehealth (INDEPENDENT_AMBULATORY_CARE_PROVIDER_SITE_OTHER): Payer: Self-pay

## 2024-04-29 ENCOUNTER — Encounter: Payer: Self-pay | Admitting: Cardiovascular Disease

## 2024-04-29 VITALS — BP 122/56 | HR 78 | Ht 65.0 in | Wt 181.8 lb

## 2024-04-29 DIAGNOSIS — R0602 Shortness of breath: Secondary | ICD-10-CM | POA: Diagnosis not present

## 2024-04-29 DIAGNOSIS — I5020 Unspecified systolic (congestive) heart failure: Secondary | ICD-10-CM | POA: Diagnosis not present

## 2024-04-29 DIAGNOSIS — J9611 Chronic respiratory failure with hypoxia: Secondary | ICD-10-CM

## 2024-04-29 DIAGNOSIS — I10 Essential (primary) hypertension: Secondary | ICD-10-CM

## 2024-04-29 DIAGNOSIS — I25118 Atherosclerotic heart disease of native coronary artery with other forms of angina pectoris: Secondary | ICD-10-CM | POA: Diagnosis not present

## 2024-04-29 DIAGNOSIS — Z952 Presence of prosthetic heart valve: Secondary | ICD-10-CM

## 2024-04-29 DIAGNOSIS — I739 Peripheral vascular disease, unspecified: Secondary | ICD-10-CM | POA: Diagnosis not present

## 2024-04-29 DIAGNOSIS — J441 Chronic obstructive pulmonary disease with (acute) exacerbation: Secondary | ICD-10-CM

## 2024-04-29 DIAGNOSIS — J431 Panlobular emphysema: Secondary | ICD-10-CM | POA: Diagnosis not present

## 2024-04-29 NOTE — Telephone Encounter (Signed)
 Spoke with the patient and spouse. Patient is scheduled with Dr. Jama on 05/13/24 with a 12:30 pm arrival time to the [facility] for a right arm fistulagram. Pre-procedure instructions were discussed and will be sent to Mychart and mailed.

## 2024-04-29 NOTE — Progress Notes (Signed)
 "     Cardiology Office Note   Date:  04/29/2024   ID:  [name], [name] January 24, 1946, MRN [id number]  PCP:  Orlean Alan HERO, FNP  Cardiologist:  Denyse Bathe, MD      History of Present Illness: [name] [name] is a 79 y.o. male who presents for  Chief Complaint  Patient presents with   Follow-up    2 month follow up    Has SOB getting worse and had ct chest by pulmonary.      Past Medical History:  Diagnosis Date   Accidental cut, puncture, perforation, or hemorrhage during heart catheterization 12/27/2012   Acute kidney injury superimposed on stage 4 chronic kidney disease (HCC) 07/27/2020   Acute on chronic combined systolic and diastolic CHF (congestive heart failure) (HCC) 04/25/2023   Acute on chronic respiratory failure with hypoxia (HCC) 04/22/2023   Adopted    AKI (acute kidney injury) 02/05/2023   ANA positive 07/12/2015   Aortic atherosclerosis    Aortic stenosis, severe    a.) s/p TAVR on 08/01/2016: 29 mm CoreValve Evolut placed   Arthritis    Bacteremia due to Streptococcus 04/24/2023   Basal cell carcinoma 01/04/2010   Right sup. post. helix. Excised 03/02/2010   BCC (basal cell carcinoma of skin) 11/24/2020   R neck below the ear, EDC   BPH (benign prostatic hyperplasia)    CAD (coronary artery disease) 12/26/2012   a.) LHC 12/26/2012: 50% mLAD, 80% mLCx, 100% pRCA --> CVTS at [facility] --> decided again revascularization due to insig LAD disease and the fact that RCA lesion was chronic   CAP (community acquired pneumonia) 07/26/2020   Chronic respiratory failure with hypoxia (HCC)    Claudication of both lower extremities    Complete tear of right rotator cuff 11/15/2015   Complex tear of lateral meniscus of left knee as current injury 12/06/2020   Complex tear of medial meniscus of left knee as current injury 10/18/2020   COPD (chronic obstructive pulmonary disease) (HCC)    Diarrhea 02/04/2023   Diet-controlled type 2 diabetes  mellitus (HCC)    Diverticulosis    Dyspnea    Electrolyte abnormality 02/04/2023   ESRD (end stage renal disease) (HCC)    GERD (gastroesophageal reflux disease)    Gout    Heart murmur    HFrEF (heart failure with reduced ejection fraction) (HCC)    a.) s/p TAVR on 08/01/2016: 29 mm CoreValve Evolut placed   History of bilateral cataract extraction    History of hiatal hernia    HLD (hyperlipidemia)    Hyperkalemia 07/30/2020   Hypertension    Hypomagnesemia 02/05/2023   Hypothyroidism    IDA (iron deficiency anemia)    Migraines    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   a.) TTE 04/24/2023: moderate (MPG 7.5 mmHG)   OSA on CPAP    Pneumonia 07/2020   Pulmonary hypertension (HCC)    a.) TTE 08/17/2022: mild pHTN per echo report; b.) TTE 04/24/2023: RVSP 39.1   Recurrent nephrolithiasis 12/27/2012   Rotator cuff tendinitis, right 11/15/2015   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 MOHS.  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/[place])      Past Surgical History:  Procedure Laterality Date   AORTIC VALVE REPLACEMENT N/A 08/01/2016   29 mm CoreValve Evolut; Location: [facility]; Surgeon: Reyes Fruits, MD   AV FISTULA PLACEMENT Right 06/27/2023   Procedure: ARTERIOVENOUS (AV) FISTULA CREATION (BRACHIALCEPHALIC);  Surgeon: Jama Cordella MATSU, MD;  Location: [facility];  Service: Vascular;  Laterality: Right;   CARDIAC CATHETERIZATION N/A 12/26/2012   2v CAD; retained pigtail in myocardium --> transferred to [facility]; Location: [facility];  Surgeon: Vita Bathe, MD   CATARACT EXTRACTION W/PHACO Left 10/03/2022   Procedure: CATARACT EXTRACTION PHACO AND INTRAOCULAR LENS PLACEMENT (IOC) LEFT DIABETIC 8.22 00:55.5;  Surgeon: Jaye Fallow, MD;  Location: [facility];  Service: Ophthalmology;  Laterality: Left;  sleep apnea   CATARACT EXTRACTION W/PHACO Right 10/17/2022   Procedure: CATARACT EXTRACTION PHACO AND INTRAOCULAR LENS PLACEMENT (IOC) RIGHT DIABETIC;  Surgeon: Jaye Fallow, MD;  Location: [facility];  Service: Ophthalmology;  Laterality: Right;  4.79 0:37.4   COLONOSCOPY     COLONOSCOPY N/A 04/28/2023   Procedure: COLONOSCOPY;  Surgeon: Toledo, Ladell POUR, MD;  Location: [facility];  Service: Gastroenterology;  Laterality: N/A;   DIALYSIS/PERMA CATHETER INSERTION N/A 08/29/2023   Procedure: DIALYSIS/PERMA CATHETER INSERTION;  Surgeon: Jama Cordella MATSU, MD;  Location: [facility];  Service: Cardiovascular;  Laterality: N/A;   DIALYSIS/PERMA CATHETER REMOVAL N/A 11/12/2023   Procedure: DIALYSIS/PERMA CATHETER REMOVAL;  Surgeon: Marea Selinda RAMAN, MD;  Location: [facility];  Service: Cardiovascular;  Laterality: N/A;   KNEE ARTHROSCOPY WITH MEDIAL MENISECTOMY Left 12/02/2020   Procedure: KNEE ARTHROSCOPY WITH DEBRIDEMENT AND PARTIAL MEDIAL AND LATERAL MENISECTOMY;  Surgeon: Edie Norleen PARAS, MD;  Location: [facility];  Service: Orthopedics;  Laterality: Left;   LITHOTRIPSY     PERCUTANEOUS REMOVAL INTRA-AORTIC BALLOON CATH N/A 12/26/2012   Procedure: PERCUTANEOUS REMOVAL INTRA-AORTIC BALLOON CATH; Surgeon: Lang Lannie Salter, MD; Location: [facility]; Service: Cardiothoracic   POLYPECTOMY  04/28/2023   Procedure: POLYPECTOMY;  Surgeon: Aundria, Ladell POUR, MD;  Location: [facility];  Service: Gastroenterology;;   RIGHT HEART CATH Right 07/13/2016   Location: [facility]; Surgeon: Ozell Estelle, MD   TEE WITHOUT CARDIOVERSION Right 04/26/2023   Procedure: TRANSESOPHAGEAL ECHOCARDIOGRAM (TEE);   Surgeon: Bathe Denyse LABOR, MD;  Location: [facility];  Service: Cardiovascular;  Laterality: Right;   TRANSESOPHAGEAL ECHOCARDIOGRAM N/A 12/26/2012   Procedure: TRANSESOPHAGEAL ECHOCARDIOGRAPHY; Surgeon: Lang Lannie Salter, MD; Location: [facility]; Service: Cardiothoracic   UMBILICAL HERNIA REPAIR N/A 02/13/2014   Procedure: LAPAROSCOPIC UMBILICAL HERNIA REPAIR; Surgeon: Shanda ONEIDA Salter, MD; Location: [facility]; Service: General Surgery     Current Outpatient Medications  Medication Sig Dispense Refill   acetaminophen  (TYLENOL ) 650 MG CR tablet Take 1,300 mg by mouth every 8 (eight) hours as needed for pain.     albuterol  (VENTOLIN  HFA) 108 (90 Base) MCG/ACT inhaler Inhale 2 puffs into the lungs every 6 (six) hours as needed for wheezing or shortness of breath.     Alcohol Swabs (DROPSAFE ALCOHOL PREP) 70 % PADS Apply 1 each topically as needed (With repatha and blood sugar checks.).     Ascorbic Acid  (VITAMIN C ) 1000 MG tablet Take 1,000 mg by mouth at bedtime.     aspirin  EC 81 MG tablet Take 81 mg by mouth daily. Swallow whole.     busPIRone  (BUSPAR ) 5 MG  tablet Take 1 tablet (5 mg total) by mouth at bedtime. 90 tablet 1   calcitRIOL  (ROCALTROL ) 0.25 MCG capsule Take 0.25 mcg by mouth daily.     citalopram  (CELEXA ) 40 MG tablet Take 1 tablet (40 mg total) by mouth daily. 90 tablet 3   Evolocumab (REPATHA SURECLICK) 140 MG/ML SOAJ INJECT CONTENT OF 1 CARTRIDGE UNDER THE SKIN EVERY OTHER WEEK 2 mL 29   ezetimibe  (ZETIA ) 10 MG tablet TAKE 1 TABLET BY MOUTH EVERYDAY AT BEDTIME 90 tablet 1   febuxostat  (ULORIC ) 40 MG tablet Take 40 mg by mouth at bedtime.     fexofenadine (ALLEGRA) 180 MG tablet Take 180 mg by mouth daily as needed for allergies or rhinitis.     fluorouracil  (EFUDEX ) 5 % cream Apply twice a day to rough areas on  temples, cheeks, forehead, top of scalp/head, crown of scalp until reaction occurs 40 g 2   Fluticasone -Umeclidin-Vilant (TRELEGY ELLIPTA ) 100-62.5-25  MCG/ACT AEPB Inhale 1 Inhalation into the lungs daily at 6 (six) AM. 60 each 11   MAGNESIUM  OXIDE 400 PO Take by mouth Nightly.     metolazone (ZAROXOLYN) 5 MG tablet Take 5 mg by mouth once a week.     metoprolol  succinate (TOPROL -XL) 50 MG 24 hr tablet TAKE 1 TABLET EVERY DAY 90 tablet 3   Multiple Vitamins-Minerals (PRESERVISION AREDS) CAPS Take 1 capsule by mouth 2 (two) times daily.     OHTUVAYRE  3 MG/2.5ML SUSP Inhale 3 mg into the lungs.     omeprazole  (PRILOSEC) 40 MG capsule Take 1 capsule (40 mg total) by mouth daily. 30 capsule 3   OXYGEN  Inhale 2-3 L into the lungs See admin instructions. Used as needed throughout the day and continuous at bedtime     pantoprazole  (PROTONIX ) 40 MG tablet      SUPER B COMPLEX/C PO Take 1 tablet by mouth daily.     torsemide  (DEMADEX ) 20 MG tablet Take 20 mg by mouth daily.     No current facility-administered medications for this visit.    Allergies:   Nsaids, Statins, Nexletol [bempedoic acid], Pedi-pre tape spray [wound dressing adhesive], Roflumilast , Silver, and Tegaderm high gelling alginate [wound dressings]    Social History:   reports that he quit smoking about 16 years ago. His smoking use included cigarettes. He started smoking about 70 years ago. He has a 108 pack-year smoking history. He quit smokeless tobacco use about 19 years ago.  His smokeless tobacco use included chew. He reports current alcohol use. He reports that he does not use drugs.   Family History:  family history includes Arthritis in his son; Asthma in his daughter; COPD in his daughter; Depression in his daughter; Diabetes in his daughter and son; Hyperlipidemia in his daughter; Hypertension in his son; Obesity in his daughter; Varicose Veins in his daughter. He was adopted.    ROS:     Review of Systems  Constitutional: Negative.   HENT: Negative.    Eyes: Negative.   Respiratory: Negative.    Gastrointestinal: Negative.   Genitourinary: Negative.    Musculoskeletal: Negative.   Skin: Negative.   Neurological: Negative.   Endo/Heme/Allergies: Negative.   Psychiatric/Behavioral: Negative.    All other systems reviewed and are negative.     All other systems are reviewed and negative.    PHYSICAL EXAM: VS:  BP (!) 122/56   Pulse 78   Ht 5' 5 (1.651 m)   Wt 181 lb 12.8 oz (82.5 kg)   SpO2  90%   BMI 30.25 kg/m  , BMI Body mass index is 30.25 kg/m. Last weight:  Wt Readings from Last 3 Encounters:  04/29/24 181 lb 12.8 oz (82.5 kg)  02/12/24 185 lb 6.4 oz (84.1 kg)  01/22/24 187 lb 9.6 oz (85.1 kg)     Physical Exam Vitals reviewed.  Constitutional:      Appearance: Normal appearance. He is normal weight.  HENT:     Head: Normocephalic.     Nose: Nose normal.     Mouth/Throat:     Mouth: Mucous membranes are moist.  Eyes:     Pupils: Pupils are equal, round, and reactive to light.  Cardiovascular:     Rate and Rhythm: Normal rate and regular rhythm.     Pulses: Normal pulses.     Heart sounds: Normal heart sounds.  Pulmonary:     Effort: Pulmonary effort is normal.  Abdominal:     General: Abdomen is flat. Bowel sounds are normal.  Musculoskeletal:        General: Normal range of motion.     Cervical back: Normal range of motion.  Skin:    General: Skin is warm.  Neurological:     General: No focal deficit present.     Mental Status: He is alert.  Psychiatric:        Mood and Affect: Mood normal.       EKG:   Recent Labs: 09/10/2023: Magnesium  1.7 01/16/2024: ALT 9; BUN 40; Creatinine, Ser 4.10; Hemoglobin 10.3; Platelets CANCELED; Potassium 4.7; Sodium 143; TSH 4.530    Lipid Panel    Component Value Date/Time   CHOL 68 (L) 01/16/2024 1020   CHOL 100 07/26/2012 0111   TRIG 55 01/16/2024 1020   TRIG 157 07/26/2012 0111   HDL 40 01/16/2024 1020   HDL 35 (L) 07/26/2012 0111   CHOLHDL 1.7 01/16/2024 1020   VLDL 31 07/26/2012 0111   LDLCALC 14 01/16/2024 1020   LDLCALC 34 07/26/2012 0111       Other studies Reviewed: Additional studies/ records that were reviewed today include:  Review of the above records demonstrates:       No data to display            ASSESSMENT AND PLAN:    ICD-10-CM   1. SOB (shortness of breath)  R06.02    worsening of copd and interstial lun disease, as crea 4, on dialysis.    2. Chronic respiratory failure with hypoxia (HCC)  J96.11     3. COPD with acute exacerbation (HCC)  J44.1     4. Claudication of both lower extremities  I73.9     5. Coronary artery disease involving native coronary artery of native heart with other form of angina pectoris  I25.118     6. Essential hypertension  I10     7. HFrEF (heart failure with reduced ejection fraction) (HCC)  I50.20     8. Severe obesity (BMI 35.0-39.9) with comorbidity (HCC)  E66.01     9. S/P TAVR (transcatheter aortic valve replacement)  Z95.2     10. 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       Problem List Items Addressed This Visit       Cardiovascular and Mediastinum   Essential hypertension   Coronary artery disease   HFrEF (heart failure with reduced ejection fraction) (HCC)     Respiratory   COPD (chronic obstructive pulmonary disease) (HCC)  Chronic respiratory failure with hypoxia (HCC)     Other   Claudication of both lower extremities   SOB (shortness of breath) - Primary   Severe obesity (BMI 35.0-39.9) with comorbidity (HCC)   Other Visit Diagnoses       COPD with acute exacerbation (HCC)         S/P TAVR (transcatheter aortic valve replacement)              Disposition:   Return in about 4 weeks (around 05/27/2024).    Total time spent: 35 minutes  Signed,  Denyse Bathe, MD  04/29/2024 11:52 AM    [facility] "

## 2024-05-06 ENCOUNTER — Other Ambulatory Visit: Payer: Self-pay

## 2024-05-06 DIAGNOSIS — N186 End stage renal disease: Secondary | ICD-10-CM

## 2024-05-06 NOTE — Progress Notes (Signed)
 "  05/06/2024 Name: [name] [name] MRN: [id number] DOB: [birth year]  Chief Complaint  Patient presents with   Medication Management   Medication Assistance    [name] [name] is a 79 y.o. year old male who presented for a telephone visit.   They were referred to the pharmacist by their PCP for assistance in managing complex medication management.    Subjective:  Care Team: Primary Care Provider: Orlean Alan HERO, FNP ; Next Scheduled Visit: 05/20/24  Medication Access/Adherence  Current Pharmacy:  [id number] - [place], [name] - [address] [place] [address] Phone: [phone] Fax: [phone]  [id number] [place], [place] - [address] AT [facility] [address] [facility] [address] [place] [address] Phone: [phone] Fax: [phone]  [facility] - [place], [place] - [address] [place] [address] Phone: [phone] Fax: [phone]  [facility] - [place], [place] - [address] [place] [address] Phone: [phone] Fax: [phone]   Patient reports affordability concerns with their medications: No  Patient reports access/transportation concerns to their pharmacy: No  Patient reports adherence concerns with their medications:  No     Med Mgmt -Has started new nebulizing treatment since last pharmacist visit, reports going well but now having cost concerns. Reports waiting for a call this afternoon to see if it will be affordable through medical/rx coverage. -Having cost concerns with Repatha -No med changes since last visit  Objective:  Lab Results  Component Value Date   HGBA1C 5.1 01/16/2024    Lab Results  Component Value Date   CREATININE 4.10 (H) 01/16/2024   BUN 40 (H) 01/16/2024   NA 143 01/16/2024   K 4.7 01/16/2024   CL 101 01/16/2024   CO2 22 01/16/2024    Lab Results  Component Value Date   CHOL 68 (L)  01/16/2024   HDL 40 01/16/2024   LDLCALC 14 01/16/2024   TRIG 55 01/16/2024   CHOLHDL 1.7 01/16/2024    Medications Reviewed Today     Reviewed by Lionell Jon DEL, RPH (Pharmacist) on 05/06/24 at 1436  Med List Status: <None>   Medication Order Taking? Sig Documenting Provider Last Dose Status Informant  acetaminophen  (TYLENOL ) 650 MG CR tablet [id number] Yes Take 1,300 mg by mouth every 8 (eight) hours as needed for pain. [provider]  Active Spouse/Significant Other  albuterol  (VENTOLIN  HFA) 108 (90 Base) MCG/ACT inhaler [id number] Yes Inhale 2 puffs into the lungs every 6 (six) hours as needed for wheezing or shortness of breath. [provider]  Active Spouse/Significant Other  Alcohol Swabs (DROPSAFE ALCOHOL PREP) 70 % PADS [id number] Yes Apply 1 each topically as needed (With repatha and blood sugar checks.). [provider]  Active Spouse/Significant Other  Ascorbic Acid  (VITAMIN C ) 1000 MG tablet [id number] Yes Take 1,000 mg by mouth at bedtime. [provider]  Active Spouse/Significant Other  aspirin  EC 81 MG tablet [id number] Yes Take 81 mg by mouth daily. Swallow whole. [provider]  Active   busPIRone  (BUSPAR ) 5 MG tablet [id number] Yes Take 1 tablet (5 mg total) by mouth at bedtime. Orlean Alan HERO, FNP  Active   calcitRIOL  (ROCALTROL ) 0.25 MCG capsule [id number] Yes Take 0.25 mcg by mouth daily. [provider]  Active Spouse/Significant Other  citalopram  (CELEXA ) 40 MG tablet [id number] Yes Take 1 tablet (40  mg total) by mouth daily. Orlean Alan HERO, FNP  Active   Evolocumab [facility] SURECLICK) 140 MG/ML EMMANUEL [id number] Yes INJECT CONTENT OF 1 CARTRIDGE UNDER THE SKIN EVERY OTHER WEEK Fernand Alter A, MD  Active   ezetimibe  (ZETIA ) 10 MG tablet [id number] Yes TAKE 1 TABLET BY MOUTH EVERYDAY AT BEDTIME Orlean Alan HERO, FNP  Active   febuxostat  (ULORIC ) 40 MG tablet [id number] Yes Take 40 mg by mouth at bedtime. [provider]  Active Spouse/Significant Other  fexofenadine (ALLEGRA) 180 MG tablet [id number] Yes Take 180 mg by mouth daily as needed for allergies or rhinitis. [provider]  Active Spouse/Significant Other  fluorouracil  (EFUDEX ) 5 % cream [id number] Yes Apply twice a day to rough areas on  temples, cheeks, forehead, top of scalp/head, crown of scalp until reaction occurs Claudene Lehmann, MD  Active   Fluticasone -Umeclidin-Vilant (TRELEGY ELLIPTA ) 100-62.5-25 MCG/ACT AEPB [id number] Yes Inhale 1 Inhalation into the lungs daily at 6 (six) AM. Orlean Alan HERO, FNP  Active Spouse/Significant Other  MAGNESIUM  OXIDE 400 PO [id number] Yes Take by mouth Nightly. [provider]  Active   metolazone (ZAROXOLYN) 5 MG tablet [id number] Yes Take 5 mg by mouth once a week. [provider]  Active   metoprolol  succinate (TOPROL -XL) 50 MG 24 hr tablet [id number] Yes TAKE 1 TABLET EVERY DAY Orlean Alan HERO, FNP  Active Spouse/Significant Other  Multiple Vitamins-Minerals (PRESERVISION AREDS) CAPS [id number] Yes Take 1 capsule by mouth 2 (two) times daily. [provider]  Active Spouse/Significant Other  OHTUVAYRE  3 MG/2.5ML SUSP [id number] Yes Inhale 3 mg into the lungs. [provider]  Active   omeprazole  (PRILOSEC) 40 MG capsule [id number] Yes Take 1 capsule (40 mg total) by mouth daily. Orlean Alan HERO, FNP  Active   OXYGEN  [id number] Yes Inhale 2-3 L into the lungs See admin instructions. Used as needed throughout the day and continuous at bedtime [provider]  Active Spouse/Significant Other  pantoprazole  (PROTONIX ) 40 MG tablet [id number] Yes  [provider]  Active   SUPER B COMPLEX/C PO [id number] Yes Take 1 tablet by mouth daily. [provider]  Active Spouse/Significant Other  torsemide  (DEMADEX ) 20 MG tablet [id number] Yes Take 20 mg by mouth daily. [provider]  Active Spouse/Significant Other           Med  Note JANEAN, JON DEL   Tue Oct 02, 2023  3:51 PM) 2 tablets daily              Assessment/Plan:   Med Mgmt -Reminded of PCP f/u visit -Enrolled patient in Hypercholesterolemia grant for repatha cost assistance, now $0 copay at pharmacy, valid through 04/05/25. Patient to expect card in the mail but will mychart card info for future reference -Reach out if cost concerns with nebulizing solution continue    Follow Up Plan: as indicated after PCP visit  Jon DEL [place], PharmD Clinical Pharmacist [phone]  "

## 2024-05-09 ENCOUNTER — Telehealth: Payer: Self-pay | Admitting: Family

## 2024-05-09 ENCOUNTER — Other Ambulatory Visit: Payer: Self-pay

## 2024-05-09 ENCOUNTER — Inpatient Hospital Stay
Admission: EM | Admit: 2024-05-09 | Discharge: 2024-05-15 | DRG: 853 | Disposition: A | Discharge status: Home / Self Care | Attending: Internal Medicine | Admitting: Internal Medicine

## 2024-05-09 ENCOUNTER — Emergency Department

## 2024-05-09 DIAGNOSIS — J440 Chronic obstructive pulmonary disease with acute lower respiratory infection: Secondary | ICD-10-CM | POA: Diagnosis present

## 2024-05-09 DIAGNOSIS — Z8249 Family history of ischemic heart disease and other diseases of the circulatory system: Secondary | ICD-10-CM

## 2024-05-09 DIAGNOSIS — A419 Sepsis, unspecified organism: Principal | ICD-10-CM | POA: Diagnosis present

## 2024-05-09 DIAGNOSIS — Z86008 Personal history of in-situ neoplasm of other site: Secondary | ICD-10-CM

## 2024-05-09 DIAGNOSIS — Z953 Presence of xenogenic heart valve: Secondary | ICD-10-CM

## 2024-05-09 DIAGNOSIS — E8770 Fluid overload, unspecified: Secondary | ICD-10-CM

## 2024-05-09 DIAGNOSIS — I959 Hypotension, unspecified: Secondary | ICD-10-CM | POA: Diagnosis present

## 2024-05-09 DIAGNOSIS — Z7982 Long term (current) use of aspirin: Secondary | ICD-10-CM

## 2024-05-09 DIAGNOSIS — Z6835 Body mass index (BMI) 35.0-35.9, adult: Secondary | ICD-10-CM

## 2024-05-09 DIAGNOSIS — I272 Pulmonary hypertension, unspecified: Secondary | ICD-10-CM | POA: Diagnosis present

## 2024-05-09 DIAGNOSIS — J189 Pneumonia, unspecified organism: Secondary | ICD-10-CM | POA: Diagnosis present

## 2024-05-09 DIAGNOSIS — G4733 Obstructive sleep apnea (adult) (pediatric): Secondary | ICD-10-CM | POA: Diagnosis present

## 2024-05-09 DIAGNOSIS — Z85828 Personal history of other malignant neoplasm of skin: Secondary | ICD-10-CM

## 2024-05-09 DIAGNOSIS — F32A Depression, unspecified: Secondary | ICD-10-CM | POA: Diagnosis present

## 2024-05-09 DIAGNOSIS — Z7951 Long term (current) use of inhaled steroids: Secondary | ICD-10-CM

## 2024-05-09 DIAGNOSIS — Y712 Prosthetic and other implants, materials and accessory cardiovascular devices associated with adverse incidents: Secondary | ICD-10-CM | POA: Diagnosis not present

## 2024-05-09 DIAGNOSIS — Z1152 Encounter for screening for COVID-19: Secondary | ICD-10-CM

## 2024-05-09 DIAGNOSIS — I2510 Atherosclerotic heart disease of native coronary artery without angina pectoris: Secondary | ICD-10-CM | POA: Diagnosis present

## 2024-05-09 DIAGNOSIS — Z91048 Other nonmedicinal substance allergy status: Secondary | ICD-10-CM

## 2024-05-09 DIAGNOSIS — Z5941 Food insecurity: Secondary | ICD-10-CM

## 2024-05-09 DIAGNOSIS — J9621 Acute and chronic respiratory failure with hypoxia: Secondary | ICD-10-CM | POA: Diagnosis present

## 2024-05-09 DIAGNOSIS — Z5948 Other specified lack of adequate food: Secondary | ICD-10-CM

## 2024-05-09 DIAGNOSIS — N2581 Secondary hyperparathyroidism of renal origin: Secondary | ICD-10-CM | POA: Diagnosis present

## 2024-05-09 DIAGNOSIS — Z952 Presence of prosthetic heart valve: Secondary | ICD-10-CM

## 2024-05-09 DIAGNOSIS — I5043 Acute on chronic combined systolic (congestive) and diastolic (congestive) heart failure: Secondary | ICD-10-CM | POA: Diagnosis present

## 2024-05-09 DIAGNOSIS — I350 Nonrheumatic aortic (valve) stenosis: Secondary | ICD-10-CM | POA: Diagnosis present

## 2024-05-09 DIAGNOSIS — Z66 Do not resuscitate: Secondary | ICD-10-CM | POA: Diagnosis present

## 2024-05-09 DIAGNOSIS — E039 Hypothyroidism, unspecified: Secondary | ICD-10-CM | POA: Diagnosis present

## 2024-05-09 DIAGNOSIS — Z9981 Dependence on supplemental oxygen: Secondary | ICD-10-CM

## 2024-05-09 DIAGNOSIS — I132 Hypertensive heart and chronic kidney disease with heart failure and with stage 5 chronic kidney disease, or end stage renal disease: Secondary | ICD-10-CM | POA: Diagnosis present

## 2024-05-09 DIAGNOSIS — Z87891 Personal history of nicotine dependence: Secondary | ICD-10-CM

## 2024-05-09 DIAGNOSIS — N186 End stage renal disease: Secondary | ICD-10-CM | POA: Diagnosis present

## 2024-05-09 DIAGNOSIS — Z888 Allergy status to other drugs, medicaments and biological substances status: Secondary | ICD-10-CM

## 2024-05-09 DIAGNOSIS — T82510A Breakdown (mechanical) of surgically created arteriovenous fistula, initial encounter: Secondary | ICD-10-CM | POA: Diagnosis not present

## 2024-05-09 DIAGNOSIS — E669 Obesity, unspecified: Secondary | ICD-10-CM | POA: Diagnosis present

## 2024-05-09 DIAGNOSIS — Z961 Presence of intraocular lens: Secondary | ICD-10-CM | POA: Diagnosis present

## 2024-05-09 DIAGNOSIS — Z59868 Other specified financial insecurity: Secondary | ICD-10-CM

## 2024-05-09 DIAGNOSIS — N400 Enlarged prostate without lower urinary tract symptoms: Secondary | ICD-10-CM | POA: Diagnosis present

## 2024-05-09 DIAGNOSIS — F419 Anxiety disorder, unspecified: Secondary | ICD-10-CM | POA: Diagnosis present

## 2024-05-09 DIAGNOSIS — E1151 Type 2 diabetes mellitus with diabetic peripheral angiopathy without gangrene: Secondary | ICD-10-CM | POA: Diagnosis present

## 2024-05-09 DIAGNOSIS — R6510 Systemic inflammatory response syndrome (SIRS) of non-infectious origin without acute organ dysfunction: Secondary | ICD-10-CM

## 2024-05-09 DIAGNOSIS — Z79899 Other long term (current) drug therapy: Secondary | ICD-10-CM

## 2024-05-09 DIAGNOSIS — Z833 Family history of diabetes mellitus: Secondary | ICD-10-CM

## 2024-05-09 DIAGNOSIS — J988 Other specified respiratory disorders: Secondary | ICD-10-CM

## 2024-05-09 DIAGNOSIS — Z886 Allergy status to analgesic agent status: Secondary | ICD-10-CM

## 2024-05-09 DIAGNOSIS — D631 Anemia in chronic kidney disease: Secondary | ICD-10-CM | POA: Diagnosis present

## 2024-05-09 DIAGNOSIS — E875 Hyperkalemia: Secondary | ICD-10-CM | POA: Diagnosis present

## 2024-05-09 DIAGNOSIS — J449 Chronic obstructive pulmonary disease, unspecified: Secondary | ICD-10-CM | POA: Diagnosis present

## 2024-05-09 DIAGNOSIS — Z992 Dependence on renal dialysis: Secondary | ICD-10-CM

## 2024-05-09 DIAGNOSIS — J9601 Acute respiratory failure with hypoxia: Principal | ICD-10-CM

## 2024-05-09 DIAGNOSIS — E1122 Type 2 diabetes mellitus with diabetic chronic kidney disease: Secondary | ICD-10-CM | POA: Diagnosis present

## 2024-05-09 DIAGNOSIS — E782 Mixed hyperlipidemia: Secondary | ICD-10-CM | POA: Diagnosis present

## 2024-05-09 LAB — CBC WITH DIFFERENTIAL/PLATELET
Abs Immature Granulocytes: 0.06 K/uL (ref 0.00–0.07)
Basophils Absolute: 0 K/uL (ref 0.0–0.1)
Basophils Relative: 0 %
Eosinophils Absolute: 0.1 K/uL (ref 0.0–0.5)
Eosinophils Relative: 1 %
HCT: 35.1 % — ABNORMAL LOW (ref 39.0–52.0)
Hemoglobin: 10.8 g/dL — ABNORMAL LOW (ref 13.0–17.0)
Immature Granulocytes: 1 %
Lymphocytes Relative: 24 %
Lymphs Abs: 2.5 K/uL (ref 0.7–4.0)
MCH: 32 pg (ref 26.0–34.0)
MCHC: 30.8 g/dL (ref 30.0–36.0)
MCV: 104.2 fL — ABNORMAL HIGH (ref 80.0–100.0)
Monocytes Absolute: 1.1 K/uL — ABNORMAL HIGH (ref 0.1–1.0)
Monocytes Relative: 10 %
Neutro Abs: 6.9 K/uL (ref 1.7–7.7)
Neutrophils Relative %: 64 %
Platelets: 167 K/uL (ref 150–400)
RBC: 3.37 MIL/uL — ABNORMAL LOW (ref 4.22–5.81)
RDW: 15.8 % — ABNORMAL HIGH (ref 11.5–15.5)
WBC: 10.7 K/uL — ABNORMAL HIGH (ref 4.0–10.5)
nRBC: 0 % (ref 0.0–0.2)

## 2024-05-09 LAB — BLOOD GAS, VENOUS
Acid-base deficit: 2.8 mmol/L — ABNORMAL HIGH (ref 0.0–2.0)
Bicarbonate: 25.4 mmol/L (ref 20.0–28.0)
Delivery systems: POSITIVE
FIO2: 35 %
Mechanical Rate: 15
O2 Saturation: 53.1 %
PEEP: 5 cmH2O
Patient temperature: 37
Pressure control: 6 cmH2O
pCO2, Ven: 58 mmHg (ref 44–60)
pH, Ven: 7.25 (ref 7.25–7.43)
pO2, Ven: 36 mmHg (ref 32–45)

## 2024-05-09 LAB — LACTIC ACID, PLASMA
Lactic Acid, Venous: 4.4 mmol/L (ref 0.5–1.9)
Lactic Acid, Venous: 1.8 mmol/L (ref 0.5–1.9)

## 2024-05-09 LAB — RESP PANEL BY RT-PCR (RSV, FLU A&B, COVID)  RVPGX2
Influenza A by PCR: NEGATIVE
Influenza B by PCR: NEGATIVE
Resp Syncytial Virus by PCR: NEGATIVE
SARS Coronavirus 2 by RT PCR: NEGATIVE

## 2024-05-09 LAB — COMPREHENSIVE METABOLIC PANEL WITH GFR
ALT: 9 U/L (ref 0–44)
AST: 28 U/L (ref 15–41)
Albumin: 3.2 g/dL — ABNORMAL LOW (ref 3.5–5.0)
Alkaline Phosphatase: 80 U/L (ref 38–126)
Anion gap: 16 — ABNORMAL HIGH (ref 5–15)
BUN: 67 mg/dL — ABNORMAL HIGH (ref 8–23)
CO2: 24 mmol/L (ref 22–32)
Calcium: 8.4 mg/dL — ABNORMAL LOW (ref 8.9–10.3)
Chloride: 97 mmol/L — ABNORMAL LOW (ref 98–111)
Creatinine, Ser: 5.75 mg/dL — ABNORMAL HIGH (ref 0.61–1.24)
GFR, Estimated: 9 mL/min — ABNORMAL LOW
Glucose, Bld: 134 mg/dL — ABNORMAL HIGH (ref 70–99)
Potassium: 5.3 mmol/L — ABNORMAL HIGH (ref 3.5–5.1)
Sodium: 137 mmol/L (ref 135–145)
Total Bilirubin: 0.3 mg/dL (ref 0.0–1.2)
Total Protein: 6.3 g/dL — ABNORMAL LOW (ref 6.5–8.1)

## 2024-05-09 LAB — TROPONIN T, HIGH SENSITIVITY: Troponin T High Sensitivity: 68 ng/L — ABNORMAL HIGH (ref 0–19)

## 2024-05-09 LAB — PRO BRAIN NATRIURETIC PEPTIDE: Pro Brain Natriuretic Peptide: >35000 pg/mL — ABNORMAL HIGH

## 2024-05-09 MED ORDER — CHLORHEXIDINE GLUCONATE CLOTH 2 % EX PADS
6.0000 | MEDICATED_PAD | Freq: Every day | CUTANEOUS | Status: DC
  Administered 2024-05-13 – 2024-05-15 (×3): 6 via TOPICAL
  Filled 2024-05-09: qty 6

## 2024-05-09 MED ORDER — SODIUM CHLORIDE 0.9 % IV SOLN
2.0000 g | Freq: Once | INTRAVENOUS | Status: AC
  Administered 2024-05-09: 2 g via INTRAVENOUS
  Filled 2024-05-09: qty 12.5

## 2024-05-09 MED ORDER — SODIUM CHLORIDE 0.9 % IV SOLN
100.0000 mg | Freq: Once | INTRAVENOUS | Status: AC
  Administered 2024-05-09: 100 mg via INTRAVENOUS
  Filled 2024-05-09: qty 100

## 2024-05-09 MED ORDER — ACETAMINOPHEN 10 MG/ML IV SOLN
1000.0000 mg | Freq: Once | INTRAVENOUS | Status: AC
  Administered 2024-05-09: 1000 mg via INTRAVENOUS
  Filled 2024-05-09: qty 100

## 2024-05-09 MED ORDER — VANCOMYCIN HCL IN DEXTROSE 1-5 GM/200ML-% IV SOLN
1000.0000 mg | Freq: Once | INTRAVENOUS | Status: DC
  Filled 2024-05-09: qty 200

## 2024-05-09 MED ORDER — ACETAMINOPHEN 500 MG PO TABS: 1000.0000 mg | ORAL_TABLET | Freq: Once | ORAL | Status: DC

## 2024-05-09 NOTE — Sepsis Progress Note (Signed)
 Following for sepsis monitoring ?

## 2024-05-09 NOTE — ED Notes (Signed)
 Family has blocked access to patient by utilizing bedside table as a desk with computer, multiple chairs and got their own recliner set up. Family has also covered patient in blanket and removed his non slip socks. Family educated on why he needs socks and why I have to be able to get to the patient for safety issues. Family appears very frustrated and daughter keeps stating I am a engineer, site.

## 2024-05-09 NOTE — ED Notes (Signed)
 ED Provider at bedside.

## 2024-05-09 NOTE — Progress Notes (Signed)
 CODE SEPSIS - PHARMACY COMMUNICATION  **Broad Spectrum Antibiotics should be administered within 1 hour of Sepsis diagnosis**  Time Code Sepsis Called/Page Received: 2033  Antibiotics Ordered: cefepime , doxycycline   Time of 1st antibiotic administration: 2041  Additional action taken by pharmacy: n/a  If necessary, Name of Provider/Nurse Contacted: n/a    Bari Glendia BIRCH ,PharmD Clinical Pharmacist  05/09/2024  8:36 PM

## 2024-05-09 NOTE — ED Notes (Signed)
Paper consent signed for dialysis.

## 2024-05-09 NOTE — Progress Notes (Signed)
 Referring Provider: No ref. provider found Primary Care Physician:  Orlean Alan HERO, FNP Primary Nephrologist:  Dr.   [place] for Consultation: ESRD  HPI: 79 year old man with history of coronary artery disease, congestive heart failure, hypertension, COPD and end-stage renal disease on dialysis who comes with history of shortness of breath and is placed on BiPAP.  Past Medical History:  Diagnosis Date   Accidental cut, puncture, perforation, or hemorrhage during heart catheterization 12/27/2012   Acute kidney injury superimposed on stage 4 chronic kidney disease (HCC) 07/27/2020   Acute on chronic combined systolic and diastolic CHF (congestive heart failure) (HCC) 04/25/2023   Acute on chronic respiratory failure with hypoxia (HCC) 04/22/2023   Adopted    AKI (acute kidney injury) 02/05/2023   ANA positive 07/12/2015   Aortic atherosclerosis    Aortic stenosis, severe    a.) s/p TAVR on 08/01/2016: 29 mm CoreValve Evolut placed   Arthritis    Bacteremia due to Streptococcus 04/24/2023   Basal cell carcinoma 01/04/2010   Right sup. post. helix. Excised 03/02/2010   BCC (basal cell carcinoma of skin) 11/24/2020   R neck below the ear, EDC   BPH (benign prostatic hyperplasia)    CAD (coronary artery disease) 12/26/2012   a.) LHC 12/26/2012: 50% mLAD, 80% mLCx, 100% pRCA --> CVTS at [facility] --> decided again revascularization due to insig LAD disease and the fact that RCA lesion was chronic   CAP (community acquired pneumonia) 07/26/2020   Chronic respiratory failure with hypoxia (HCC)    Claudication of both lower extremities    Complete tear of right rotator cuff 11/15/2015   Complex tear of lateral meniscus of left knee as current injury 12/06/2020   Complex tear of medial meniscus of left knee as current injury 10/18/2020   COPD (chronic obstructive pulmonary disease) (HCC)    Diarrhea 02/04/2023   Diet-controlled type 2 diabetes mellitus (HCC)    Diverticulosis    Dyspnea     Electrolyte abnormality 02/04/2023   ESRD (end stage renal disease) (HCC)    GERD (gastroesophageal reflux disease)    Gout    Heart murmur    HFrEF (heart failure with reduced ejection fraction) (HCC)    a.) s/p TAVR on 08/01/2016: 29 mm CoreValve Evolut placed   History of bilateral cataract extraction    History of hiatal hernia    HLD (hyperlipidemia)    Hyperkalemia 07/30/2020   Hypertension    Hypomagnesemia 02/05/2023   Hypothyroidism    IDA (iron deficiency anemia)    Migraines    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   a.) TTE 04/24/2023: moderate (MPG 7.5 mmHG)   OSA on CPAP    Pneumonia 07/2020   Pulmonary hypertension (HCC)    a.) TTE 08/17/2022: mild pHTN per echo report; b.) TTE 04/24/2023: RVSP 39.1   Recurrent nephrolithiasis 12/27/2012   Rotator cuff tendinitis, right 11/15/2015   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 MOHS.   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/[place])     Past Surgical History:  Procedure Laterality Date   AORTIC VALVE REPLACEMENT N/A 08/01/2016   29 mm CoreValve Evolut; Location: [facility]; Surgeon: Reyes Fruits, MD   AV FISTULA PLACEMENT Right 06/27/2023   Procedure: ARTERIOVENOUS (AV) FISTULA CREATION (BRACHIALCEPHALIC);  Surgeon: Jama Cordella MATSU, MD;  Location: [facility] ORS;  Service: Vascular;  Laterality: Right;   CARDIAC CATHETERIZATION N/A 12/26/2012   2v CAD; retained pigtail in myocardium --> transferred to [facility]; Location: [facility]; Surgeon: Vita Bathe, MD   CATARACT EXTRACTION  W/PHACO Left 10/03/2022   Procedure: CATARACT EXTRACTION PHACO AND INTRAOCULAR LENS PLACEMENT (IOC) LEFT DIABETIC 8.22 00:55.5;  Surgeon: Jaye Fallow, MD;  Location: [facility];  Service: Ophthalmology;  Laterality: Left;  sleep apnea   CATARACT EXTRACTION W/PHACO Right 10/17/2022   Procedure: CATARACT EXTRACTION PHACO AND INTRAOCULAR LENS PLACEMENT (IOC) RIGHT DIABETIC;  Surgeon: Jaye Fallow, MD;  Location: [facility];  Service: Ophthalmology;  Laterality: Right;  4.79 0:37.4   COLONOSCOPY     COLONOSCOPY N/A 04/28/2023   Procedure: COLONOSCOPY;  Surgeon: Toledo, Ladell POUR, MD;  Location: [facility] END[facility];  Service: Gastroenterology;  Laterality: N/A;   DIALYSIS/PERMA CATHETER INSERTION N/A 08/29/2023   Procedure: DIALYSIS/PERMA CATHETER INSERTION;  Surgeon: Jama Cordella MATSU, MD;  Location: [facility];  Service: Cardiovascular;  Laterality: N/A;   DIALYSIS/PERMA CATHETER REMOVAL N/A 11/12/2023   Procedure: DIALYSIS/PERMA CATHETER REMOVAL;  Surgeon: Marea Selinda RAMAN, MD;  Location: [facility] INV[facility] CV [facility];  Service: Cardiovascular;  Laterality: N/A;   KNEE ARTHROSCOPY WITH MEDIAL MENISECTOMY Left 12/02/2020   Procedure: KNEE ARTHROSCOPY WITH DEBRIDEMENT AND PARTIAL MEDIAL AND LATERAL MENISECTOMY;  Surgeon: Edie Norleen PARAS, MD;  Location: [facility];  Service: Orthopedics;  Laterality: Left;   LITHOTRIPSY     PERCUTANEOUS REMOVAL INTRA-AORTIC BALLOON CATH N/A 12/26/2012   Procedure: PERCUTANEOUS REMOVAL INTRA-AORTIC BALLOON CATH; Surgeon: Lang Lannie Salter, MD; Location: [facility]; Service: Cardiothoracic   POLYPECTOMY  04/28/2023   Procedure: POLYPECTOMY;  Surgeon: Aundria, Ladell POUR, MD;  Location: [facility] END[facility];  Service: Gastroenterology;;   RIGHT HEART CATH Right 07/13/2016   Location: [facility]; Surgeon: Ozell Estelle, MD   TEE WITHOUT CARDIOVERSION Right 04/26/2023   Procedure: TRANSESOPHAGEAL ECHOCARDIOGRAM (TEE);  Surgeon: Bathe Denyse LABOR, MD;  Location: [facility]  ORS;  Service: Cardiovascular;  Laterality: Right;   TRANSESOPHAGEAL ECHOCARDIOGRAM N/A 12/26/2012   Procedure: TRANSESOPHAGEAL ECHOCARDIOGRAPHY; Surgeon: Lang Lannie Salter, MD; Location: [facility]; Service: Cardiothoracic   UMBILICAL HERNIA REPAIR N/A 02/13/2014   Procedure: LAPAROSCOPIC UMBILICAL HERNIA REPAIR; Surgeon: Shanda ONEIDA Salter, MD; Location: [facility]; Service: General Surgery    Prior to Admission medications  Medication Sig Start Date End Date Taking? Authorizing Provider  acetaminophen  (TYLENOL ) 650 MG CR tablet Take 1,300 mg by mouth every 8 (eight) hours as needed for pain.    [provider]  albuterol  (VENTOLIN  HFA) 108 (90 Base) MCG/ACT inhaler Inhale 2 puffs into the lungs every 6 (six) hours as needed for wheezing or shortness of breath. 07/08/14   [provider]  Alcohol Swabs (DROPSAFE ALCOHOL PREP) 70 % PADS Apply 1 each topically as needed (With repatha and blood sugar checks.). 01/19/22   [provider]  Ascorbic Acid  (VITAMIN C ) 1000 MG tablet Take 1,000 mg by mouth at bedtime.    [provider]  aspirin  EC 81 MG tablet Take 81 mg by mouth daily. Swallow whole.    [provider]  busPIRone  (BUSPAR ) 5 MG tablet Take 1 tablet (5 mg total) by mouth at bedtime. 11/17/23   Orlean,  Alan HERO, FNP  calcitRIOL  (ROCALTROL ) 0.25 MCG capsule Take 0.25 mcg by mouth daily.    [provider]  citalopram  (CELEXA ) 40 MG tablet Take 1 tablet (40 mg total) by mouth daily. 10/25/23   Orlean Alan HERO, FNP  Evolocumab (REPATHA SURECLICK) 140 MG/ML SOAJ INJECT CONTENT OF 1 CARTRIDGE UNDER THE SKIN EVERY OTHER WEEK 03/03/24   Fernand Alter A, MD  ezetimibe  (ZETIA ) 10 MG tablet TAKE 1 TABLET BY MOUTH EVERYDAY AT BEDTIME 04/11/24   Orlean Alan HERO, FNP  febuxostat  (ULORIC ) 40 MG tablet Take 40 mg by mouth at bedtime. 10/09/22   [provider]  fexofenadine (ALLEGRA) 180 MG tablet Take 180 mg by mouth daily as  needed for allergies or rhinitis.    [provider]  fluorouracil  (EFUDEX ) 5 % cream Apply twice a day to rough areas on  temples, cheeks, forehead, top of scalp/head, crown of scalp until reaction occurs 03/13/24   Claudene Lehmann, MD  Fluticasone -Umeclidin-Vilant (TRELEGY ELLIPTA ) 100-62.5-25 MCG/ACT AEPB Inhale 1 Inhalation into the lungs daily at 6 (six) AM. 10/12/22   Orlean Alan HERO, FNP  MAGNESIUM  OXIDE 400 PO Take by mouth Nightly.    [provider]  metolazone (ZAROXOLYN) 5 MG tablet Take 5 mg by mouth once a week.    [provider]  metoprolol  succinate (TOPROL -XL) 50 MG 24 hr tablet TAKE 1 TABLET EVERY DAY 03/24/23   Orlean Alan HERO, FNP  Multiple Vitamins-Minerals (PRESERVISION AREDS) CAPS Take 1 capsule by mouth 2 (two) times daily.    [provider]  OHTUVAYRE  3 MG/2.5ML SUSP Inhale 3 mg into the lungs. 12/28/23   [provider]  omeprazole  (PRILOSEC) 40 MG capsule Take 1 capsule (40 mg total) by mouth daily. 01/22/24   Orlean Alan HERO, FNP  OXYGEN  Inhale 2-3 L into the lungs See admin instructions. Used as needed throughout the day and continuous at bedtime    [provider]  pantoprazole  (PROTONIX ) 40 MG tablet  11/10/23   [provider]  SUPER B COMPLEX/C PO Take 1 tablet by mouth daily.    [provider]  torsemide  (DEMADEX ) 20 MG tablet Take 20 mg by mouth daily. 02/26/23   [provider]    Current Facility-Administered Medications  Medication Dose Route Frequency Provider Last Rate Last Admin   [START ON 05/10/2024] Chlorhexidine  Gluconate Cloth 2 % PADS 6 each  6 each Topical Q0600 Janyra Barillas, MD       doxycycline  (VIBRAMYCIN ) 100 mg in sodium chloride  0.9 % 250 mL IVPB  100 mg Intravenous Once Jessup, Charles, MD 125 mL/hr at 05/09/24 2114 100 mg at 05/09/24 2114   Current Outpatient Medications  Medication Sig Dispense Refill   acetaminophen  (TYLENOL ) 650 MG CR tablet Take  1,300 mg by mouth every 8 (eight) hours as needed for pain.     albuterol  (VENTOLIN  HFA) 108 (90 Base) MCG/ACT inhaler Inhale 2 puffs into the lungs every 6 (six) hours as needed for wheezing or shortness of breath.     Alcohol Swabs (DROPSAFE ALCOHOL PREP) 70 % PADS Apply 1 each topically as needed (With repatha and blood sugar checks.).     Ascorbic Acid  (VITAMIN C ) 1000 MG tablet Take 1,000 mg by mouth at bedtime.     aspirin  EC 81 MG tablet Take 81 mg by mouth daily. Swallow whole.     busPIRone  (BUSPAR ) 5 MG tablet Take 1 tablet (5 mg total) by mouth at bedtime. 90 tablet 1  calcitRIOL  (ROCALTROL ) 0.25 MCG capsule Take 0.25 mcg by mouth daily.     citalopram  (CELEXA ) 40 MG tablet Take 1 tablet (40 mg total) by mouth daily. 90 tablet 3   Evolocumab (REPATHA SURECLICK) 140 MG/ML SOAJ INJECT CONTENT OF 1 CARTRIDGE UNDER THE SKIN EVERY OTHER WEEK 2 mL 29   ezetimibe  (ZETIA ) 10 MG tablet TAKE 1 TABLET BY MOUTH EVERYDAY AT BEDTIME 90 tablet 1   febuxostat  (ULORIC ) 40 MG tablet Take 40 mg by mouth at bedtime.     fexofenadine (ALLEGRA) 180 MG tablet Take 180 mg by mouth daily as needed for allergies or rhinitis.     fluorouracil  (EFUDEX ) 5 % cream Apply twice a day to rough areas on  temples, cheeks, forehead, top of scalp/head, crown of scalp until reaction occurs 40 g 2   Fluticasone -Umeclidin-Vilant (TRELEGY ELLIPTA ) 100-62.5-25 MCG/ACT AEPB Inhale 1 Inhalation into the lungs daily at 6 (six) AM. 60 each 11   MAGNESIUM  OXIDE 400 PO Take by mouth Nightly.     metolazone (ZAROXOLYN) 5 MG tablet Take 5 mg by mouth once a week.     metoprolol  succinate (TOPROL -XL) 50 MG 24 hr tablet TAKE 1 TABLET EVERY DAY 90 tablet 3   Multiple Vitamins-Minerals (PRESERVISION AREDS) CAPS Take 1 capsule by mouth 2 (two) times daily.     OHTUVAYRE  3 MG/2.5ML SUSP Inhale 3 mg into the lungs.     omeprazole  (PRILOSEC) 40 MG capsule Take 1 capsule (40 mg total) by mouth daily. 30 capsule 3   OXYGEN  Inhale 2-3 L into  the lungs See admin instructions. Used as needed throughout the day and continuous at bedtime     pantoprazole  (PROTONIX ) 40 MG tablet      SUPER B COMPLEX/C PO Take 1 tablet by mouth daily.     torsemide  (DEMADEX ) 20 MG tablet Take 20 mg by mouth daily.      Allergies as of 05/09/2024 - Review Complete 05/09/2024  Allergen Reaction Noted   Nsaids Other (See Comments) 07/27/2020   Statins Other (See Comments) 05/27/2022   Nexletol [bempedoic acid] Diarrhea 07/27/2020   Pedi-pre tape spray [wound dressing adhesive]  10/09/2023   Roflumilast   08/31/2023   Silver Other (See Comments) 02/20/2023   Tegaderm high gelling alginate [wound dressings] Rash 04/22/2023    Family History  Adopted: Yes  Problem Relation Age of Onset   Varicose Veins Daughter    Obesity Daughter    Hyperlipidemia Daughter    Diabetes Daughter    COPD Daughter    Depression Daughter    Asthma Daughter    Arthritis Son    Diabetes Son    Hypertension Son     Social History   Socioeconomic History   Marital status: Married    Spouse name: Diaz Crago   Number of children: 2   Years of education: Not on file   Highest education level: Not on file  Occupational History   Occupation: retired  Tobacco Use   Smoking status: Former    Current packs/day: 0.00    Average packs/day: 2.0 packs/day for 54.0 years (108.0 ttl pk-yrs)    Types: Cigarettes    Start date: 96    Quit date: 2010    Years since quitting: 16.0   Smokeless tobacco: Former    Types: Chew    Quit date: 05/16/2004  Vaping Use   Vaping status: Never Used  Substance and Sexual Activity   Alcohol use: Yes    Comment: rare beer  Drug use: Never   Sexual activity: Not on file  Other Topics Concern   Not on file  Social History Narrative   Not on file   Social Drivers of Health   Tobacco Use: Medium Risk (04/29/2024)   Patient History    Smoking Tobacco Use: Former    Smokeless Tobacco Use: Former    Passive Exposure:  Not on Actuary Strain: Low Risk (08/27/2023)   Overall Financial Resource Strain (CARDIA)    Difficulty of Paying Living Expenses: Not very hard  Recent Concern: Financial Resource Strain - High Risk (07/24/2023)   Received from [facility]   Overall Financial Resource Strain (CARDIA)    Difficulty of Paying Living Expenses: Hard  Food Insecurity: No Food Insecurity (08/28/2023)   Hunger Vital Sign    Worried About Running Out of Food in the Last Year: Never true    Ran Out of Food in the Last Year: Never true  Recent Concern: Food Insecurity - Food Insecurity Present (07/12/2023)   Received from [facility]   Hunger Vital Sign    Worried About Running Out of Food in the Last Year: Sometimes true    Ran Out of Food in the Last Year: Sometimes true  Transportation Needs: No Transportation Needs (08/28/2023)   PRAPARE - Administrator, Civil Service (Medical): No    Lack of Transportation (Non-Medical): No  Physical Activity: Inactive (07/18/2023)   Received from [facility]   Exercise Vital Sign    On average, how many days per week do you engage in moderate to strenuous exercise (like a brisk walk)?: 0 days    On average, how many minutes do you engage in exercise at this level?: 0 min  Stress: No Stress Concern Present (08/27/2023)   [employer] of Occupational Health - Occupational Stress Questionnaire    Feeling of Stress : Only a little  Recent Concern: Stress - Stress Concern Present (07/18/2023)   Received from [facility] of Occupational Health - Occupational Stress Questionnaire    Feeling of Stress : To some extent  Social Connections: Socially Integrated (08/28/2023)   Social Connection and Isolation Panel    Frequency of Communication with Friends and Family: More than three times a week    Frequency of Social Gatherings with Friends and Family: More than three  times a week    Attends Religious Services: More than 4 times per year    Active Member of [employer] or Organizations: Yes    Attends Banker Meetings: Never    Marital Status: Married  Catering Manager Violence: Not At Risk (08/28/2023)   Humiliation, Afraid, Rape, and Kick questionnaire    Fear of Current or Ex-Partner: No    Emotionally Abused: No    Physically Abused: No    Sexually Abused: No  Depression (PHQ2-9): Medium Risk (01/10/2024)   Depression (PHQ2-9)    PHQ-2 Score: 7  Alcohol Screen: Low Risk (03/18/2023)   Alcohol Screen    Last Alcohol Screening Score (AUDIT): 1  Housing: Low Risk (08/28/2023)   Housing Stability Vital Sign    Unable to Pay for Housing in the Last Year: No    Number of Times Moved in the Last Year: 0    Homeless in the Last Year: No  Utilities: Not At Risk (08/28/2023)   AHC Utilities    Threatened with loss of utilities: No  Health  Literacy: Adequate Health Literacy (08/27/2023)   B1300 Health Literacy    Frequency of need for help with medical instructions: Rarely    Physical Exam: Vital signs in last 24 hours: Temp:  [100.8 F (38.2 C)] 100.8 F (38.2 C) (01/16 2023) Pulse Rate:  [87-96] 87 (01/16 2111) Resp:  [24-29] 25 (01/16 2111) BP: (115-137)/(65-80) 120/65 (01/16 2111) SpO2:  [100 %] 100 % (01/16 2111) FiO2 (%):  [65 %] 65 % (01/16 2021)   General:   Alert,  Well-developed, well-nourished, pleasant and cooperative in NAD Head:  Normocephalic and atraumatic. Eyes:  Sclera clear, no icterus.   Conjunctiva pink. Ears:  Normal auditory acuity. Nose:  No deformity, discharge,  or lesions. Lungs:  Clear throughout to auscultation.   No wheezes, crackles, or rhonchi. No acute distress. Heart:  Regular rate and rhythm; no murmurs, clicks, rubs,  or gallops. Abdomen:  Soft, nontender and nondistended. No masses, hepatosplenomegaly or hernias noted. Normal bowel sounds, without guarding, and without rebound.   Extremities:  Without  clubbing or edema.  Intake/Output from previous day: No intake/output data recorded. Intake/Output this shift: No intake/output data recorded.  Lab Results: No results for input(s): WBC, HGB, HCT, PLT in the last 72 hours. BMET No results for input(s): NA, K, CL, CO2, GLUCOSE, BUN, CREATININE, CALCIUM, PHOS in the last 72 hours.  Invalid input(s): MAG LFT No results for input(s): PROT, ALBUMIN , AST, ALT, ALKPHOS, BILITOT, BILIDIR, IBILI in the last 72 hours. PT/INR No results for input(s): LABPROT, INR in the last 72 hours. Hepatitis Panel No results for input(s): HEPBSAG, HCVAB, HEPAIGM, HEPBIGM in the last 72 hours.  Studies/Results: DG Chest Portable 1 View Result Date: 05/09/2024 CLINICAL DATA:  Short of breath EXAM: PORTABLE CHEST 1 VIEW COMPARISON:  12/04/2023, 09/06/2022 FINDINGS: Cardiomegaly with vascular congestion and diffuse interstitial opacities suspected to represent pulmonary edema. Aortic atherosclerosis. No pleural effusion or pneumothorax. IMPRESSION: Cardiomegaly with vascular congestion and diffuse interstitial opacities suspected to represent pulmonary edema. Electronically Signed   By: Luke Bun M.D.   On: 05/09/2024 20:59    Assessment/Plan:   79 year old man with history of coronary artery disease, congestive heart failure, hypertension, COPD and end-stage renal disease on dialysis who comes with history of shortness of breath and is placed on BiPAP.  ESRD: Will dialyze emergently for tonight.  Will attempt 3 L fluid removal as tolerated.  ANEMIA: Follow anemia protocols.  MBD: Will monitor PTH, calcium and phosphorus levels.  HTN/VOL: Continue present antihypertensive medications.  Labs and medications reviewed. Will continue to monitor closely.    LOS: 0 Pinkey Edman, MD [facility] @TODAY @9 :27 PM

## 2024-05-09 NOTE — ED Notes (Signed)
 Purple top, grey top , Lt green top, 1 set of cultures, blue top,

## 2024-05-09 NOTE — Telephone Encounter (Signed)
 [name] left VM stating he has a scratchy throat, congestion, and is blowing out yellow mucus. Wants to know if Alan can send something in for him. Please advise.

## 2024-05-09 NOTE — ED Notes (Signed)
 Family to desk asking could the patient have something to drink. Family educated patient is wearing BIPAP and he cannot have food or drink.

## 2024-05-09 NOTE — Consult Note (Signed)
 CODE SEPSIS - PHARMACY COMMUNICATION  **Broad Spectrum Antibiotics should be administered within 1 hour of Sepsis diagnosis**  Time Code Sepsis Called/Page Received: 2033  Antibiotics Ordered: cefepime  and doxy  Time of 1st antibiotic administration: 2041  Additional action taken by pharmacy: none  If necessary, Name of Provider/Nurse Contacted: n/a   Ariadne Rissmiller Rodriguez-Guzman PharmD, BCPS 05/09/2024 8:48 PM

## 2024-05-09 NOTE — ED Notes (Signed)
 PT restless in bed, trying to sit up and wants to walk around. RN advised against r/t respiratory condition. MD made aware.

## 2024-05-09 NOTE — ED Triage Notes (Signed)
 PT bib acems for respiratory distress. From home, dialysis M/W/F skipped today. Pitting edema, 88% on cpap at home. 1 inch nitro on chest. 20G LAC. 90s hr/ 100.8 axillary/ CBG 180.

## 2024-05-09 NOTE — ED Notes (Incomplete)
 [name]

## 2024-05-09 NOTE — ED Notes (Signed)
 Purple top, grey top , Lt green top, 2 sets of cultures, blue top, VBG sent to lab

## 2024-05-09 NOTE — ED Provider Notes (Signed)
 "  [facility] Provider Note    Event Date/Time   First MD Initiated Contact with Patient 05/09/24 2021     (approximate)   History   Chief Complaint Respiratory Distress   HPI  [name] [name] is a 79 y.o. 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  Patient reports that he has been feeling generally ill since yesterday with subjective fevers and then increasing difficulty breathing today.  He did not feel well enough to go to his dialysis appointment today, eventually contacted EMS tonight for difficulty breathing.  He reports his last time his appointment was on Monday as he does not usually go on Wednesdays.  Per EMS, patient was found with CPAP in place in respiratory distress with oxygen  saturations of 88%.  He was transition to EMS CPAP without significant improvement, Nitropaste was placed onto his chest.  He reports a recent productive cough but denies any pain in his chest or abdomen.  He continues to make urine but denies any dysuria.  He does report significant swelling in his legs.     Physical Exam   Triage Vital Signs: ED Triage Vitals  Encounter Vitals Group     BP 05/09/24 2023 115/67     Girls Systolic BP Percentile --      Girls Diastolic BP Percentile --      Boys Systolic BP Percentile --      Boys Diastolic BP Percentile --      Pulse Rate 05/09/24 2023 92     Resp 05/09/24 2023 (!) 29     Temp 05/09/24 2023 (!) 100.8 F (38.2 C)     Temp Source 05/09/24 2023 Axillary     SpO2 05/09/24 2023 100 %     Weight --      Height --      Head Circumference --      Peak Flow --      Pain Score 05/09/24 2020 0     Pain Loc --      Pain Education --      Exclude from Growth Chart --     Most recent vital signs: Vitals:   05/09/24 2300 05/09/24 2330  BP: (!) 101/49 (!) 104/47  Pulse: 73 76  Resp: (!) 26 18  Temp: 98.4  F (36.9 C)   SpO2:      Constitutional: Alert and oriented. Eyes: Conjunctivae are normal. Head: Atraumatic. Nose: No congestion/rhinnorhea. Mouth/Throat: Mucous membranes are moist. Cardiovascular: Normal rate, regular rhythm. Grossly normal heart sounds.  2+ radial pulses bilaterally.  Right upper extremity AV fistula with palpable thrill. Respiratory: Tachypneic with increased work of breathing and accessory muscle use noted. Gastrointestinal: Soft and nontender. No distention. Musculoskeletal: No lower extremity tenderness, 2+ pitting edema to knees bilaterally. Neurologic:  Normal speech and language. No gross focal neurologic deficits are appreciated.    ED Results / Procedures / Treatments   Labs (all labs ordered are listed, but only abnormal results are displayed) Labs Reviewed  CBC WITH DIFFERENTIAL/PLATELET - Abnormal; Notable for the following components:      Result Value   WBC 10.7 (*)    RBC 3.37 (*)    Hemoglobin 10.8 (*)    HCT 35.1 (*)    MCV 104.2 (*)    RDW 15.8 (*)    Monocytes Absolute 1.1 (*)    All other components  within normal limits  LACTIC ACID, PLASMA - Abnormal; Notable for the following components:   Lactic Acid, Venous 4.4 (*)    All other components within normal limits  BLOOD GAS, VENOUS - Abnormal; Notable for the following components:   Acid-base deficit 2.8 (*)    All other components within normal limits  PRO BRAIN NATRIURETIC PEPTIDE - Abnormal; Notable for the following components:   Pro Brain Natriuretic Peptide >35,000.0 (*)    All other components within normal limits  COMPREHENSIVE METABOLIC PANEL WITH GFR - Abnormal; Notable for the following components:   Potassium 5.3 (*)    Chloride 97 (*)    Glucose, Bld 134 (*)    BUN 67 (*)    Creatinine, Ser 5.75 (*)    Calcium 8.4 (*)    Total Protein 6.3 (*)    Albumin  3.2 (*)    GFR, Estimated 9 (*)    Anion gap 16 (*)    All other components within normal limits  TROPONIN  T, HIGH SENSITIVITY - Abnormal; Notable for the following components:   Troponin T High Sensitivity 68 (*)    All other components within normal limits  RESP PANEL BY RT-PCR (RSV, FLU A&B, COVID)  RVPGX2  CULTURE, BLOOD (ROUTINE X 2)  CULTURE, BLOOD (ROUTINE X 2)  LACTIC ACID, PLASMA  HEPATITIS B SURFACE ANTIGEN  HEPATITIS B SURFACE ANTIBODY, QUANTITATIVE  TROPONIN T, HIGH SENSITIVITY     EKG  ED ECG REPORT I, Carlin Palin, the attending physician, personally viewed and interpreted this ECG.   Date: 05/09/2024  EKG Time: 20:22  Rate: 129  Rhythm: sinus tachycardia  Axis: Normal  Intervals:Incomplete LBBB  ST&T Change: None  RADIOLOGY Chest x-ray reviewed and interpreted by me with multifocal infiltrates most consistent with pulmonary edema.  PROCEDURES:  Critical Care performed: Yes, see critical care procedure note(s)  .Critical Care  Performed by: Palin Carlin, MD Authorized by: Palin Carlin, MD   Critical care provider statement:    Critical care time (minutes):  30   Critical care time was exclusive of:  Separately billable procedures and treating other patients and teaching time   Critical care was necessary to treat or prevent imminent or life-threatening deterioration of the following conditions:  Respiratory failure   Critical care was time spent personally by me on the following activities:  Development of treatment plan with patient or surrogate, discussions with consultants, evaluation of patient's response to treatment, examination of patient, ordering and review of laboratory studies, ordering and review of radiographic studies, ordering and performing treatments and interventions, pulse oximetry, re-evaluation of patient's condition and review of old charts   I assumed direction of critical care for this patient from another provider in my specialty: no     Care discussed with: admitting provider      MEDICATIONS ORDERED IN ED: Medications   Chlorhexidine  Gluconate Cloth 2 % PADS 6 each (has no administration in time range)  ceFEPIme  (MAXIPIME ) 2 g in sodium chloride  0.9 % 100 mL IVPB (0 g Intravenous Stopped 05/09/24 2104)  doxycycline  (VIBRAMYCIN ) 100 mg in sodium chloride  0.9 % 250 mL IVPB (0 mg Intravenous Stopped 05/09/24 2314)  acetaminophen  (OFIRMEV ) IV 1,000 mg (0 mg Intravenous Stopped 05/09/24 2121)     IMPRESSION / MDM / ASSESSMENT AND PLAN / ED COURSE  I reviewed the triage vital signs and the nursing notes.  79 y.o. male with past medical history of hypertension, diabetes, CAD, CHF, COPD, and ESRD on Mondays and Fridays who presents to the ED complaining of general malaise for the past 24 hours with subjective fevers, increasing difficulty breathing today.  Patient's presentation is most consistent with acute presentation with potential threat to life or bodily function.  Differential diagnosis includes, but is not limited to, sepsis, pneumonia, hypervolemia, CHF, COPD exacerbation, anemia, electrolyte abnormality, ACS, PE.  Patient ill-appearing and in moderate respiratory distress on arrival to the ED, was immediately transitioned to BiPAP.  Vital signs remarkable for fever of 100.8 as well as tachypnea, sepsis workup initiated and will give IV antibiotics for suspected pneumonia.  COVID and flu testing was also performed.  EKG shows sinus tachycardia without ischemic changes, chest x-ray and labs are pending at this time.  Patient does appear fluid overloaded and may benefit from emergent dialysis.  Patient's work of breathing significantly improved on reassessment, chest x-ray consistent with pulmonary edema but no focal infiltrate noted.  Labs with leukocytosis but no significant anemia, mild hyperkalemia noted.  Case discussed with Dr. Maurita of nephrology, who will attempt to arrange emergent dialysis, but states staff may not be available until first thing in the morning.  Lactic  acid was elevated to 4.4, but normalized on recheck.  Case discussed with hospitalist for admission.      FINAL CLINICAL IMPRESSION(S) / ED DIAGNOSES   Final diagnoses:  Acute respiratory failure with hypoxia (HCC)  ESRD on hemodialysis (HCC)  Sepsis without acute organ dysfunction, due to unspecified organism [facility])     Rx / DC Orders   ED Discharge Orders     None        Note:  This document was prepared using Dragon voice recognition software and may include unintentional dictation errors.   Willo Dunnings, MD 05/10/24 0015  "

## 2024-05-10 ENCOUNTER — Inpatient Hospital Stay: Admit: 2024-05-10 | Discharge: 2024-05-10 | Disposition: A | Attending: Internal Medicine

## 2024-05-10 ENCOUNTER — Encounter: Payer: Self-pay | Admitting: Internal Medicine

## 2024-05-10 DIAGNOSIS — D631 Anemia in chronic kidney disease: Secondary | ICD-10-CM | POA: Diagnosis present

## 2024-05-10 DIAGNOSIS — J189 Pneumonia, unspecified organism: Secondary | ICD-10-CM | POA: Diagnosis present

## 2024-05-10 DIAGNOSIS — N186 End stage renal disease: Secondary | ICD-10-CM | POA: Diagnosis present

## 2024-05-10 DIAGNOSIS — T82898A Other specified complication of vascular prosthetic devices, implants and grafts, initial encounter: Secondary | ICD-10-CM | POA: Diagnosis not present

## 2024-05-10 DIAGNOSIS — E1151 Type 2 diabetes mellitus with diabetic peripheral angiopathy without gangrene: Secondary | ICD-10-CM | POA: Diagnosis present

## 2024-05-10 DIAGNOSIS — E8770 Fluid overload, unspecified: Secondary | ICD-10-CM

## 2024-05-10 DIAGNOSIS — I5043 Acute on chronic combined systolic (congestive) and diastolic (congestive) heart failure: Secondary | ICD-10-CM

## 2024-05-10 DIAGNOSIS — Z1152 Encounter for screening for COVID-19: Secondary | ICD-10-CM | POA: Diagnosis not present

## 2024-05-10 DIAGNOSIS — Z952 Presence of prosthetic heart valve: Secondary | ICD-10-CM | POA: Diagnosis not present

## 2024-05-10 DIAGNOSIS — Z992 Dependence on renal dialysis: Secondary | ICD-10-CM | POA: Diagnosis not present

## 2024-05-10 DIAGNOSIS — J9621 Acute and chronic respiratory failure with hypoxia: Secondary | ICD-10-CM | POA: Diagnosis present

## 2024-05-10 DIAGNOSIS — I350 Nonrheumatic aortic (valve) stenosis: Secondary | ICD-10-CM | POA: Diagnosis present

## 2024-05-10 DIAGNOSIS — T82858A Stenosis of vascular prosthetic devices, implants and grafts, initial encounter: Secondary | ICD-10-CM | POA: Diagnosis not present

## 2024-05-10 DIAGNOSIS — N2581 Secondary hyperparathyroidism of renal origin: Secondary | ICD-10-CM | POA: Diagnosis present

## 2024-05-10 DIAGNOSIS — E875 Hyperkalemia: Secondary | ICD-10-CM | POA: Diagnosis present

## 2024-05-10 DIAGNOSIS — J988 Other specified respiratory disorders: Secondary | ICD-10-CM

## 2024-05-10 DIAGNOSIS — I132 Hypertensive heart and chronic kidney disease with heart failure and with stage 5 chronic kidney disease, or end stage renal disease: Secondary | ICD-10-CM | POA: Diagnosis present

## 2024-05-10 DIAGNOSIS — I509 Heart failure, unspecified: Secondary | ICD-10-CM | POA: Diagnosis present

## 2024-05-10 DIAGNOSIS — R6510 Systemic inflammatory response syndrome (SIRS) of non-infectious origin without acute organ dysfunction: Secondary | ICD-10-CM

## 2024-05-10 DIAGNOSIS — Z66 Do not resuscitate: Secondary | ICD-10-CM | POA: Diagnosis present

## 2024-05-10 DIAGNOSIS — A419 Sepsis, unspecified organism: Secondary | ICD-10-CM | POA: Diagnosis present

## 2024-05-10 DIAGNOSIS — J440 Chronic obstructive pulmonary disease with acute lower respiratory infection: Secondary | ICD-10-CM | POA: Diagnosis present

## 2024-05-10 DIAGNOSIS — E1122 Type 2 diabetes mellitus with diabetic chronic kidney disease: Secondary | ICD-10-CM | POA: Diagnosis present

## 2024-05-10 DIAGNOSIS — Y712 Prosthetic and other implants, materials and accessory cardiovascular devices associated with adverse incidents: Secondary | ICD-10-CM | POA: Diagnosis not present

## 2024-05-10 DIAGNOSIS — E039 Hypothyroidism, unspecified: Secondary | ICD-10-CM | POA: Diagnosis present

## 2024-05-10 DIAGNOSIS — N400 Enlarged prostate without lower urinary tract symptoms: Secondary | ICD-10-CM | POA: Diagnosis present

## 2024-05-10 DIAGNOSIS — G4733 Obstructive sleep apnea (adult) (pediatric): Secondary | ICD-10-CM | POA: Diagnosis present

## 2024-05-10 DIAGNOSIS — F419 Anxiety disorder, unspecified: Secondary | ICD-10-CM | POA: Diagnosis present

## 2024-05-10 DIAGNOSIS — T82510A Breakdown (mechanical) of surgically created arteriovenous fistula, initial encounter: Secondary | ICD-10-CM | POA: Diagnosis not present

## 2024-05-10 DIAGNOSIS — I959 Hypotension, unspecified: Secondary | ICD-10-CM | POA: Diagnosis not present

## 2024-05-10 DIAGNOSIS — E669 Obesity, unspecified: Secondary | ICD-10-CM | POA: Diagnosis present

## 2024-05-10 DIAGNOSIS — E782 Mixed hyperlipidemia: Secondary | ICD-10-CM | POA: Diagnosis present

## 2024-05-10 DIAGNOSIS — I272 Pulmonary hypertension, unspecified: Secondary | ICD-10-CM | POA: Diagnosis present

## 2024-05-10 DIAGNOSIS — J431 Panlobular emphysema: Secondary | ICD-10-CM | POA: Diagnosis not present

## 2024-05-10 LAB — BLOOD GAS, VENOUS
Acid-Base Excess: 0.7 mmol/L (ref 0.0–2.0)
Bicarbonate: 25.4 mmol/L (ref 20.0–28.0)
FIO2: 35 %
Mechanical Rate: 15
O2 Saturation: 95.4 %
PEEP: 5 cmH2O
Patient temperature: 37
Pressure control: 6 cmH2O
pCO2, Ven: 40 mmHg — ABNORMAL LOW (ref 44–60)
pH, Ven: 7.41 (ref 7.25–7.43)
pO2, Ven: 69 mmHg — ABNORMAL HIGH (ref 32–45)

## 2024-05-10 LAB — TROPONIN T, HIGH SENSITIVITY: Troponin T High Sensitivity: 71 ng/L — ABNORMAL HIGH (ref 0–19)

## 2024-05-10 LAB — ECHOCARDIOGRAM COMPLETE
AR max vel: 0.56 cm2
AV Area VTI: 0.59 cm2
AV Area mean vel: 0.55 cm2
AV Mean grad: 11 mmHg
AV Peak grad: 18.7 mmHg
Ao pk vel: 2.16 m/s
Area-P 1/2: 3.17 cm2
Calc EF: 27.4 %
Height: 65 in
MV M vel: 4.84 m/s
MV Peak grad: 93.7 mmHg
MV VTI: 0.47 cm2
S' Lateral: 4.9 cm
Single Plane A2C EF: 28.8 %
Single Plane A4C EF: 28.6 %
Weight: 3386.27 [oz_av]

## 2024-05-10 LAB — GLUCOSE, CAPILLARY: Glucose-Capillary: 152 mg/dL — ABNORMAL HIGH (ref 70–99)

## 2024-05-10 LAB — HEPATITIS B SURFACE ANTIGEN: Hepatitis B Surface Ag: NONREACTIVE

## 2024-05-10 MED ORDER — BUSPIRONE HCL 10 MG PO TABS
5.0000 mg | ORAL_TABLET | Freq: Every day | ORAL | Status: DC
  Administered 2024-05-10 – 2024-05-14 (×5): 5 mg via ORAL
  Filled 2024-05-10 (×5): qty 1

## 2024-05-10 MED ORDER — ONDANSETRON HCL 4 MG/2ML IJ SOLN: 4.0000 mg | Freq: Four times a day (QID) | INTRAMUSCULAR | Status: DC | PRN

## 2024-05-10 MED ORDER — PANTOPRAZOLE SODIUM 40 MG PO TBEC
40.0000 mg | DELAYED_RELEASE_TABLET | Freq: Every day | ORAL | Status: DC
  Administered 2024-05-10 – 2024-05-15 (×5): 40 mg via ORAL
  Filled 2024-05-10 (×5): qty 1

## 2024-05-10 MED ORDER — MIDODRINE HCL 5 MG PO TABS
5.0000 mg | ORAL_TABLET | Freq: Three times a day (TID) | ORAL | Status: DC | PRN
  Administered 2024-05-13 (×2): 5 mg via ORAL
  Filled 2024-05-10: qty 1

## 2024-05-10 MED ORDER — HEPARIN SODIUM (PORCINE) 5000 UNIT/ML IJ SOLN
5000.0000 [IU] | Freq: Three times a day (TID) | INTRAMUSCULAR | Status: DC
  Administered 2024-05-10 – 2024-05-15 (×14): 5000 [IU] via SUBCUTANEOUS
  Filled 2024-05-10 (×14): qty 1

## 2024-05-10 MED ORDER — ACETAMINOPHEN 325 MG PO TABS: 650.0000 mg | ORAL_TABLET | Freq: Four times a day (QID) | ORAL | Status: DC | PRN

## 2024-05-10 MED ORDER — FUROSEMIDE 10 MG/ML IJ SOLN
40.0000 mg | Freq: Once | INTRAMUSCULAR | Status: AC
  Administered 2024-05-10: 40 mg via INTRAVENOUS
  Filled 2024-05-10: qty 4

## 2024-05-10 MED ORDER — ALBUMIN HUMAN 25 % IV SOLN
12.5000 g | Freq: Once | INTRAVENOUS | Status: AC
  Administered 2024-05-13: 12.5 g via INTRAVENOUS

## 2024-05-10 MED ORDER — IPRATROPIUM-ALBUTEROL 0.5-2.5 (3) MG/3ML IN SOLN: 3.0000 mL | RESPIRATORY_TRACT | Status: DC | PRN

## 2024-05-10 MED ORDER — OXYCODONE HCL 5 MG PO TABS: 5.0000 mg | ORAL_TABLET | ORAL | Status: DC | PRN

## 2024-05-10 MED ORDER — GUAIFENESIN ER 600 MG PO TB12
600.0000 mg | ORAL_TABLET | Freq: Two times a day (BID) | ORAL | Status: DC
  Administered 2024-05-10 – 2024-05-15 (×10): 600 mg via ORAL
  Filled 2024-05-10 (×10): qty 1

## 2024-05-10 MED ORDER — ACETAMINOPHEN 650 MG RE SUPP: 650.0000 mg | Freq: Four times a day (QID) | RECTAL | Status: DC | PRN

## 2024-05-10 MED ORDER — SODIUM CHLORIDE 0.9 % IV SOLN
100.0000 mg | Freq: Two times a day (BID) | INTRAVENOUS | Status: DC
  Administered 2024-05-10 – 2024-05-11 (×3): 100 mg via INTRAVENOUS
  Filled 2024-05-10 (×3): qty 100

## 2024-05-10 MED ORDER — ALBUMIN HUMAN 25 % IV SOLN
INTRAVENOUS | Status: AC
  Filled 2024-05-10: qty 50

## 2024-05-10 MED ORDER — SODIUM CHLORIDE 0.9 % IV SOLN
1.0000 g | INTRAVENOUS | Status: DC
  Administered 2024-05-10 – 2024-05-14 (×5): 1 g via INTRAVENOUS
  Filled 2024-05-10 (×6): qty 10

## 2024-05-10 MED ORDER — CITALOPRAM HYDROBROMIDE 20 MG PO TABS
40.0000 mg | ORAL_TABLET | Freq: Every day | ORAL | Status: DC
  Administered 2024-05-10 – 2024-05-15 (×5): 40 mg via ORAL
  Filled 2024-05-10 (×5): qty 2

## 2024-05-10 MED ORDER — ONDANSETRON HCL 4 MG PO TABS: 4.0000 mg | ORAL_TABLET | Freq: Four times a day (QID) | ORAL | Status: DC | PRN

## 2024-05-10 MED ORDER — CALCITRIOL 0.25 MCG PO CAPS
0.2500 ug | ORAL_CAPSULE | Freq: Every day | ORAL | Status: DC
  Administered 2024-05-10 – 2024-05-15 (×5): 0.25 ug via ORAL
  Filled 2024-05-10 (×6): qty 1

## 2024-05-10 MED ORDER — FUROSEMIDE 10 MG/ML IJ SOLN
40.0000 mg | Freq: Two times a day (BID) | INTRAMUSCULAR | Status: DC
  Administered 2024-05-10 – 2024-05-15 (×9): 40 mg via INTRAVENOUS
  Filled 2024-05-10 (×9): qty 4

## 2024-05-10 MED ORDER — EZETIMIBE 10 MG PO TABS
10.0000 mg | ORAL_TABLET | Freq: Every day | ORAL | Status: DC
  Administered 2024-05-10 – 2024-05-15 (×5): 10 mg via ORAL
  Filled 2024-05-10 (×5): qty 1

## 2024-05-10 MED ORDER — ASPIRIN 81 MG PO TBEC
81.0000 mg | DELAYED_RELEASE_TABLET | Freq: Every day | ORAL | Status: DC
  Administered 2024-05-10 – 2024-05-15 (×5): 81 mg via ORAL
  Filled 2024-05-10 (×5): qty 1

## 2024-05-10 NOTE — H&P (Signed)
 " History and Physical    Patient: [name] [name] FMW:[id number] DOB: 1946/02/12 DOA: 05/09/2024 DOS: the patient was seen and examined on 05/10/2024 PCP: Orlean Alan HERO, FNP  Patient coming from: Home  Chief Complaint:  Chief Complaint  Patient presents with   Respiratory Distress    HPI: [name] [name] is a 79 y.o. 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  History provided by wife at bedside.  Patient 's last session was Monday Jan 12. He missed his Friday dialysis session due to feeling unwell with fever cough and malaise.  Later in the day he went into acute respiratory distress.  EMS brought him in on CPAP. In the ED, he was febrile to 100.8 heart rate 96 and tachypneic to the high 20s with O2 sat of 100% on CPAP.  BP was initially normal at 115/67, trending down to as low as 91/44 during workup in the ED. VBG on BiPAP with pH 7.25 and pCO2 58. Labs otherwise notable for WBC 10.7 with lactic acid 1.8 and negative respiratory viral panel. Hemoglobin at baseline at 10.8. CMP with results in keeping with dialysis status.  Mild hyperkalemia 5.3 Troponin 68 and proBNP> 35,000 Chest x-ray consistent with pulmonary edema.  The ED provider spoke with nephrologist, Dr. Dominica, who will be making arrangements for urgent dialysis or dialysis first thing in the AM.  Patient was started on cefepime  and doxycycline  for possible pneumonia  Admission requested     Past Medical History:  Diagnosis Date   Accidental cut, puncture, perforation, or hemorrhage  during heart catheterization 12/27/2012   Acute kidney injury superimposed on stage 4 chronic kidney disease (HCC) 07/27/2020   Acute on chronic combined systolic and diastolic CHF (congestive heart failure) (HCC) 04/25/2023   Acute on chronic respiratory failure with hypoxia (HCC) 04/22/2023   Adopted    AKI (acute kidney injury) 02/05/2023   ANA positive 07/12/2015   Aortic atherosclerosis    Aortic stenosis, severe    a.) s/p TAVR on 08/01/2016: 29 mm CoreValve Evolut placed   Arthritis    Bacteremia due to Streptococcus 04/24/2023   Basal cell carcinoma 01/04/2010   Right sup. post. helix. Excised 03/02/2010   BCC (basal cell carcinoma of skin) 11/24/2020   R neck below the ear, EDC   BPH (benign prostatic hyperplasia)    CAD (coronary artery disease) 12/26/2012   a.) LHC 12/26/2012: 50% mLAD, 80% mLCx, 100% pRCA --> CVTS at [facility] --> decided again revascularization due to insig LAD disease and the fact that RCA lesion was chronic   CAP (community acquired pneumonia) 07/26/2020   Chronic respiratory failure with hypoxia (HCC)    Claudication of both lower extremities    Complete tear of right rotator cuff 11/15/2015   Complex tear of lateral meniscus of left knee as current injury 12/06/2020   Complex tear of medial meniscus of left knee as current injury 10/18/2020   COPD (chronic obstructive pulmonary disease) (HCC)    Diarrhea 02/04/2023   Diet-controlled type 2 diabetes mellitus (HCC)    Diverticulosis    Dyspnea    Electrolyte abnormality 02/04/2023  ESRD (end stage renal disease) (HCC)    GERD (gastroesophageal reflux disease)    Gout    Heart murmur    HFrEF (heart failure with reduced ejection fraction) (HCC)    a.) s/p TAVR on 08/01/2016: 29 mm CoreValve Evolut placed   History of bilateral cataract extraction    History of hiatal hernia    HLD (hyperlipidemia)    Hyperkalemia 07/30/2020   Hypertension    Hypomagnesemia 02/05/2023   Hypothyroidism    IDA (iron  deficiency anemia)    Migraines    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   a.) TTE 04/24/2023: moderate (MPG 7.5 mmHG)   OSA on CPAP    Pneumonia 07/2020   Pulmonary hypertension (HCC)    a.) TTE 08/17/2022: mild pHTN per echo report; b.) TTE 04/24/2023: RVSP 39.1   Recurrent nephrolithiasis 12/27/2012   Rotator cuff tendinitis, right 11/15/2015   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 MOHS.   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/[place])    Past Surgical History:  Procedure Laterality Date   AORTIC VALVE REPLACEMENT N/A 08/01/2016   29 mm CoreValve Evolut; Location: [facility]; Surgeon: Reyes Fruits, MD   AV FISTULA PLACEMENT Right 06/27/2023   Procedure: ARTERIOVENOUS (AV) FISTULA CREATION (BRACHIALCEPHALIC);  Surgeon: Jama Cordella MATSU, MD;  Location: [facility];  Service: Vascular;  Laterality: Right;   CARDIAC CATHETERIZATION N/A 12/26/2012   2v CAD; retained pigtail in myocardium --> transferred to [facility]; Location: [facility]; Surgeon: Vita Bathe, MD   CATARACT EXTRACTION W/PHACO Left 10/03/2022   Procedure: CATARACT EXTRACTION PHACO AND INTRAOCULAR LENS PLACEMENT (IOC) LEFT DIABETIC 8.22 00:55.5;  Surgeon: Jaye Fallow, MD;  Location: [facility];  Service: Ophthalmology;  Laterality: Left;  sleep apnea   CATARACT EXTRACTION W/PHACO Right 10/17/2022   Procedure: CATARACT EXTRACTION PHACO AND INTRAOCULAR LENS PLACEMENT (IOC) RIGHT DIABETIC;  Surgeon: Jaye Fallow, MD;  Location: [facility];  Service: Ophthalmology;   Laterality: Right;  4.79 0:37.4   COLONOSCOPY     COLONOSCOPY N/A 04/28/2023   Procedure: COLONOSCOPY;  Surgeon: Toledo, Ladell POUR, MD;  Location: [facility];  Service: Gastroenterology;  Laterality: N/A;   DIALYSIS/PERMA CATHETER INSERTION N/A 08/29/2023   Procedure: DIALYSIS/PERMA CATHETER INSERTION;  Surgeon: Jama Cordella MATSU, MD;  Location: [facility];  Service: Cardiovascular;  Laterality: N/A;   DIALYSIS/PERMA CATHETER REMOVAL N/A 11/12/2023   Procedure: DIALYSIS/PERMA CATHETER REMOVAL;  Surgeon: Marea Selinda RAMAN, MD;  Location: [facility];  Service: Cardiovascular;  Laterality: N/A;   KNEE ARTHROSCOPY WITH MEDIAL MENISECTOMY Left 12/02/2020   Procedure: KNEE ARTHROSCOPY WITH DEBRIDEMENT AND PARTIAL MEDIAL AND LATERAL MENISECTOMY;  Surgeon: Edie Norleen PARAS, MD;  Location: [facility];  Service: Orthopedics;  Laterality: Left;   LITHOTRIPSY     PERCUTANEOUS REMOVAL INTRA-AORTIC BALLOON CATH N/A 12/26/2012   Procedure: PERCUTANEOUS REMOVAL INTRA-AORTIC BALLOON CATH; Surgeon: Lang Lannie Salter, MD; Location: [facility]; Service: Cardiothoracic   POLYPECTOMY  04/28/2023   Procedure: POLYPECTOMY;  Surgeon: Toledo, Ladell POUR, MD;  Location: [facility];  Service:  Gastroenterology;;   RIGHT HEART CATH Right 07/13/2016   Location: [facility]; Surgeon: Ozell Estelle, MD   TEE WITHOUT CARDIOVERSION Right 04/26/2023   Procedure: TRANSESOPHAGEAL ECHOCARDIOGRAM (TEE);  Surgeon: Fernand Denyse LABOR, MD;  Location: [facility];  Service: Cardiovascular;  Laterality: Right;   TRANSESOPHAGEAL ECHOCARDIOGRAM N/A 12/26/2012   Procedure: TRANSESOPHAGEAL ECHOCARDIOGRAPHY; Surgeon: Lang Lannie Salter, MD; Location: [facility]; Service: Cardiothoracic   UMBILICAL HERNIA REPAIR N/A 02/13/2014   Procedure: LAPAROSCOPIC UMBILICAL HERNIA REPAIR; Surgeon: Shanda ONEIDA Salter, MD; Location: [facility]; Service: General Surgery   Social History:  reports that he quit smoking about 16 years  ago. His smoking use included cigarettes. He started smoking about 70 years ago. He has a 108 pack-year smoking history. He quit smokeless tobacco use about 19 years ago.  His smokeless tobacco use included chew. He reports current alcohol use. He reports that he does not use drugs.  Allergies[1]  Family History  Adopted: Yes  Problem Relation Age of Onset   Varicose Veins Daughter    Obesity Daughter    Hyperlipidemia Daughter    Diabetes Daughter    COPD Daughter    Depression Daughter    Asthma Daughter    Arthritis Son    Diabetes Son    Hypertension Son     Prior to Admission medications  Medication Sig Start Date End Date Taking? Authorizing Provider  acetaminophen  (TYLENOL ) 650 MG CR tablet Take 1,300 mg by mouth every 8 (eight) hours as needed for pain.    [provider]  albuterol  (VENTOLIN  HFA) 108 (90 Base) MCG/ACT inhaler Inhale 2 puffs into the lungs every 6 (six) hours as needed for wheezing or shortness of breath. 07/08/14   [provider]  Alcohol Swabs (DROPSAFE ALCOHOL PREP) 70 % PADS Apply 1 each topically as needed (With repatha and blood sugar checks.). 01/19/22   [provider]  Ascorbic Acid  (VITAMIN C ) 1000 MG tablet Take 1,000 mg by mouth at bedtime.    [provider]  aspirin  EC 81 MG tablet Take 81 mg by mouth daily. Swallow whole.    [provider]  busPIRone  (BUSPAR ) 5 MG tablet Take 1 tablet (5 mg total) by mouth at bedtime. 11/17/23   Orlean Alan HERO, FNP  calcitRIOL  (ROCALTROL ) 0.25 MCG capsule Take 0.25 mcg by mouth daily.    [provider]  citalopram  (CELEXA ) 40 MG tablet Take 1 tablet (40 mg total) by mouth daily. 10/25/23   Orlean Alan HERO, FNP  Evolocumab (REPATHA SURECLICK) 140 MG/ML SOAJ INJECT CONTENT OF 1 CARTRIDGE UNDER THE SKIN EVERY OTHER WEEK 03/03/24   Fernand Denyse LABOR, MD  ezetimibe  (ZETIA ) 10 MG tablet TAKE 1 TABLET BY MOUTH EVERYDAY AT BEDTIME 04/11/24   Orlean Alan HERO, FNP   febuxostat  (ULORIC ) 40 MG tablet Take 40 mg by mouth at bedtime. 10/09/22   [provider]  fexofenadine (ALLEGRA) 180 MG tablet Take 180 mg by mouth daily as needed for allergies or rhinitis.    [provider]  fluorouracil  (EFUDEX ) 5 % cream Apply twice a day to rough areas on  temples, cheeks, forehead, top of scalp/head, crown of scalp until reaction occurs 03/13/24   Claudene Lehmann, MD  Fluticasone -Umeclidin-Vilant (TRELEGY ELLIPTA ) 100-62.5-25 MCG/ACT AEPB Inhale 1 Inhalation into the lungs daily at 6 (six) AM. 10/12/22   Orlean Alan HERO, FNP  MAGNESIUM  OXIDE 400 PO Take by mouth Nightly.    [provider]  metolazone (ZAROXOLYN) 5 MG tablet Take 5 mg  by mouth once a week.    [provider]  metoprolol  succinate (TOPROL -XL) 50 MG 24 hr tablet TAKE 1 TABLET EVERY DAY 03/24/23   Orlean Alan HERO, FNP  Multiple Vitamins-Minerals (PRESERVISION AREDS) CAPS Take 1 capsule by mouth 2 (two) times daily.    [provider]  OHTUVAYRE  3 MG/2.5ML SUSP Inhale 3 mg into the lungs. 12/28/23   [provider]  omeprazole  (PRILOSEC) 40 MG capsule Take 1 capsule (40 mg total) by mouth daily. 01/22/24   Orlean Alan HERO, FNP  OXYGEN  Inhale 2-3 L into the lungs See admin instructions. Used as needed throughout the day and continuous at bedtime    [provider]  pantoprazole  (PROTONIX ) 40 MG tablet  11/10/23   [provider]  SUPER B COMPLEX/C PO Take 1 tablet by mouth daily.    [provider]  torsemide  (DEMADEX ) 20 MG tablet Take 20 mg by mouth daily. 02/26/23   [provider]    Physical Exam: Vitals:   05/09/24 2200 05/09/24 2230 05/09/24 2300 05/09/24 2330  BP: (!) 106/51 (!) 91/44 (!) 101/49 (!) 104/47  Pulse: 78 72 73 76  Resp: (!) 24 (!) 24 (!) 26 18  Temp:   98.4 F (36.9 C)   TempSrc:   Axillary   SpO2:  100%     Physical Exam Vitals and nursing note reviewed.  Constitutional:       General: He is awake. He is in acute distress.     Comments: Patient on BiPAP, unable to speak in more than 1 or 2 words  HENT:     Head: Normocephalic and atraumatic.  Cardiovascular:     Rate and Rhythm: Regular rhythm.     Heart sounds: Normal heart sounds.  Pulmonary:     Effort: Tachypnea and respiratory distress present.     Breath sounds: Rales present.  Abdominal:     Palpations: Abdomen is soft.     Tenderness: There is no abdominal tenderness.  Neurological:     Mental Status: Mental status is at baseline.     Labs on Admission: I have personally reviewed following labs and imaging studies  CBC: Recent Labs  Lab 05/09/24 2033  WBC 10.7*  NEUTROABS 6.9  HGB 10.8*  HCT 35.1*  MCV 104.2*  PLT 167   Basic Metabolic Panel: Recent Labs  Lab 05/09/24 2229  NA 137  K 5.3*  CL 97*  CO2 24  GLUCOSE 134*  BUN 67*  CREATININE 5.75*  CALCIUM 8.4*   GFR: Estimated Creatinine Clearance: 10.1 mL/min (A) (by C-G formula based on SCr of 5.75 mg/dL (H)). Liver Function Tests: Recent Labs  Lab 05/09/24 2229  AST 28  ALT 9  ALKPHOS 80  BILITOT 0.3  PROT 6.3*  ALBUMIN  3.2*   No results for input(s): LIPASE, AMYLASE in the last 168 hours. No results for input(s): AMMONIA in the last 168 hours. Coagulation Profile: No results for input(s): INR, PROTIME in the last 168 hours. Cardiac Enzymes: No results for input(s): CKTOTAL, CKMB, CKMBINDEX, TROPONINI in the last 168 hours. BNP (last 3 results) Recent Labs    05/09/24 2229  PROBNP >35,000.0*   HbA1C: No results for input(s): HGBA1C in the last 72 hours. CBG: No results for input(s): GLUCAP in the last 168 hours. Lipid Profile: No results for input(s): CHOL, HDL, LDLCALC, TRIG, CHOLHDL, LDLDIRECT in the last 72 hours. Thyroid  Function Tests: No results for input(s): TSH, T4TOTAL, FREET4, T3FREE, THYROIDAB in the last  72 hours. Anemia Panel: No results for  input(s): VITAMINB12, FOLATE, FERRITIN, TIBC, IRON, RETICCTPCT in the last 72 hours. Urine analysis:    Component Value Date/Time   COLORURINE YELLOW (A) 04/21/2023 2356   APPEARANCEUR HAZY (A) 04/21/2023 2356   APPEARANCEUR Clear 07/27/2014 1247   LABSPEC 1.013 04/21/2023 2356   LABSPEC 1.016 07/27/2014 1247   PHURINE 5.0 04/21/2023 2356   GLUCOSEU NEGATIVE 04/21/2023 2356   GLUCOSEU 50 mg/dL 95/95/7983 8752   HGBUR NEGATIVE 04/21/2023 2356   BILIRUBINUR NEGATIVE 04/21/2023 2356   BILIRUBINUR Negative 07/27/2014 1247   KETONESUR NEGATIVE 04/21/2023 2356   PROTEINUR >=300 (A) 04/21/2023 2356   NITRITE NEGATIVE 04/21/2023 2356   LEUKOCYTESUR NEGATIVE 04/21/2023 2356   LEUKOCYTESUR Negative 07/27/2014 1247    Radiological Exams on Admission: DG Chest Portable 1 View Result Date: 05/09/2024 CLINICAL DATA:  Short of breath EXAM: PORTABLE CHEST 1 VIEW COMPARISON:  12/04/2023, 09/06/2022 FINDINGS: Cardiomegaly with vascular congestion and diffuse interstitial opacities suspected to represent pulmonary edema. Aortic atherosclerosis. No pleural effusion or pneumothorax. IMPRESSION: Cardiomegaly with vascular congestion and diffuse interstitial opacities suspected to represent pulmonary edema. Electronically Signed   By: Luke Bun M.D.   On: 05/09/2024 20:59   Data Reviewed for HPI: Relevant notes from primary care and specialist visits, past discharge summaries as available in EHR, including Care Everywhere. Prior diagnostic testing as pertinent to current admission diagnoses Updated medications and problem lists for reconciliation ED course, including vitals, labs, imaging, treatment and response to treatment Triage notes, nursing and pharmacy notes and ED provider's notes Notable results as noted above in HPI      Assessment and Plan: * Acute on chronic combined systolic and diastolic CHF (congestive heart failure) (HCC) Acute respiratory failure with hypoxia Volume  overload secondary to missed dialysis Hypotension precluding Lasix  or nitrates at this time Nephrology arranging for urgent dialysis Continue BiPAP Will have to hold home metolazone, metoprolol  Will update echocardiogram Expecting improvement with dialysis If dialysis delayed and blood pressure remaining low, will transfer to stepdown for closer cardiopulmonary monitoring and pressor support Addendum: Patient getting dialyzed.  Appears more comfortable.  BP improving.  Spoke with dialysis nurse.  Albumin  will be administered if needed to have blood pressure  Hypotension BP as low as 90s over 40s in the ED in the setting of acute respiratory tract infection Possibly related to sepsis, versus home antihypertensives Treating SIRS/sepsis as outlined on the specific problem May need pressors if unable to maintain MAP over 65 Close hemodynamic monitoring Addendum: Improving on reassessment during dialysis.  Albumin  to be administered if needed  Acute respiratory tract infection, possibly lower SIRS, possible sepsis SIRS criteria include fever, tachycardia, tachypnea with leukocytosis Patient presented with cough, fever and malaise, negative respiratory viral panel Continue empiric treatment with cefepime  and doxycycline  for possible lower respiratory tract infection Antitussives Supplemental oxygen   ESRD on hemodialysis (HCC) Anemia of ESRD Patient only dialyzes twice a week on Mondays and Fridays Nephrology arranging urgent dialysis  Severe aortic stenosis S/P TAVR (transcatheter aortic valve replacement) No acute issues suspected at this time  OSA on CPAP CPAP nightly once weaned off BiPAP  COPD (chronic obstructive pulmonary disease) (HCC) Acute exacerbation not suspected at this time Continue home inhalers DuoNebs as needed  Coronary artery disease Continue home aspirin  and ezetimibe . Holding metoprolol  due to low blood pressure  Anxiety and depression Continue buspirone   and citalopram     DVT prophylaxis: heparin   Consults: nephrology  Advance Care Planning:   Code Status:  Prior   Family Communication: none  Disposition Plan: Back to previous home environment  Severity of Illness: The appropriate patient status for this patient is INPATIENT. Inpatient status is judged to be reasonable and necessary in order to provide the required intensity of service to ensure the patient's safety. The patient's presenting symptoms, physical exam findings, and initial radiographic and laboratory data in the context of their chronic comorbidities is felt to place them at high risk for further clinical deterioration. Furthermore, it is not anticipated that the patient will be medically stable for discharge from the hospital within 2 midnights of admission.   * I certify that at the point of admission it is my clinical judgment that the patient will require inpatient hospital care spanning beyond 2 midnights from the point of admission due to high intensity of service, high risk for further deterioration and high frequency of surveillance required.*  CRITICAL CARE Performed by: Delayne LULLA Solian   Total critical care time: 65 minutes  Critical care time was exclusive of separately billable procedures and treating other patients.  Critical care was necessary to treat or prevent imminent or life-threatening deterioration.  Critical care was time spent personally by me on the following activities: development of treatment plan with patient and/or surrogate as well as nursing, discussions with consultants, evaluation of patient's response to treatment, examination of patient, obtaining history from patient or surrogate, ordering and performing treatments and interventions, ordering and review of laboratory studies, ordering and review of radiographic studies, pulse oximetry and re-evaluation of patient's condition.   Author: Delayne LULLA Solian, MD 05/10/2024 12:25 AM  For on  call review [email or web address].      [1]  Allergies Allergen Reactions   Nsaids Other (See Comments)    Avoid due to kidney disease    Statins Other (See Comments)    Myalgias   Nexletol [Bempedoic Acid] Diarrhea    fatigue   Pedi-Pre Tape Spray [Wound Dressing Adhesive]    Roflumilast     Silver Other (See Comments)   Tegaderm High Gelling Alginate [Wound Dressings] Rash   "

## 2024-05-10 NOTE — ED Notes (Signed)
 Report given to dialysis.

## 2024-05-10 NOTE — Hospital Course (Signed)
 [name]

## 2024-05-10 NOTE — ED Notes (Signed)
 CCMD monitoring pt

## 2024-05-10 NOTE — Assessment & Plan Note (Addendum)
 SIRS, possible sepsis SIRS criteria include fever, tachycardia, tachypnea with leukocytosis Patient presented with cough, fever and malaise, negative respiratory viral panel Continue empiric treatment with cefepime  and doxycycline  for possible lower respiratory tract infection Antitussives Supplemental oxygen 

## 2024-05-10 NOTE — Assessment & Plan Note (Signed)
 CPAP nightly once weaned off BiPAP

## 2024-05-10 NOTE — ED Notes (Signed)
 Patient was pulled up in the bed by 2 RNs. Patient was allowed to wash his face with a warm wash cloth while being on 3L [place]. Patient still tachypneic while off of 3L, RR: 20~30, Nailbeds also discolored but cap refill <3secs, Pt's lips also discolored. Patient was allowed few sips of ice water  for comfort and moistening of inside of mouth. Bi-pap was re-applied and patient educated about reasons why Bi-pap needed to remain on at this time. Patient and patients wife at bedside agreed they understood. Patient also positioned at 60 degree angle to help assist in breathing.

## 2024-05-10 NOTE — ED Notes (Addendum)
 Patient was placed on a non-rebreather while sleeping. Patient instructed to push call button to alert RN when he was ready to use the [facility] again.

## 2024-05-10 NOTE — Progress Notes (Signed)
*  PRELIMINARY RESULTS* Echocardiogram 2D Echocardiogram has been performed.  [name] [name] 05/10/2024, 11:20 AM

## 2024-05-10 NOTE — ED Notes (Signed)
 93kg. pulled during dialysis per dialysis RN.

## 2024-05-10 NOTE — Assessment & Plan Note (Signed)
 Continue home aspirin  and ezetimibe . Holding metoprolol  due to low blood pressure

## 2024-05-10 NOTE — Progress Notes (Signed)
 Received patient in bed to unit.  Alert and oriented.x4 Informed consent signed and in chart.   TX duration:2 hours  Patient tolerated well, requested to be off early due to cramps. Pt educated about being d/c early  Alert, without acute distress.  Hand-off given to patient's nurse.   Access used: AVF Access issues: none  Total UF removed: Medication(s) given: none Post HD VS: see table below Post HD weight: 93kg    05/10/24 0428  Vitals  Temp 98.4 F (36.9 C)  Temp Source Axillary  BP 102/78  MAP (mmHg) 87  BP Location Left Arm  BP Method Automatic  Patient Position (if appropriate) Lying  Pulse Rate (!) 112  Pulse Rate Source Monitor  ECG Heart Rate 80  Resp (!) 24  Weight 93 kg  Type of Weight Post-Dialysis  Oxygen  Therapy  SpO2 97 %  O2 Device CPAP  Patient Activity (if Appropriate) In bed  Pulse Oximetry Type Continuous  During Treatment Monitoring  HD Safety Checks Performed Yes  Intra-Hemodialysis Comments See progress note  Post Treatment  Dialyzer Clearance Lightly streaked  Hemodialysis Intake (mL) 200 mL  Liters Processed 50.05  Fluid Removed (mL) 2000 mL  Tolerated HD Treatment Yes  Post-Hemodialysis Comments goal not met  AVG/AVF Arterial Site Held (minutes) 5 minutes  AVG/AVF Venous Site Held (minutes) 5 minutes  Fistula / Graft Right Forearm Arteriovenous fistula  Placement Date/Time: 06/27/23 1232   Placed prior to admission: No  Orientation: Right  Access Location: Forearm  Access Type: Arteriovenous fistula  Site Condition No complications  Fistula / Graft Assessment Present;Thrill;Bruit  Status Deaccessed;Flushed;Patent    Lorrene KANDICE [facility]

## 2024-05-10 NOTE — Progress Notes (Signed)
 " [facility]  PROGRESS NOTE   Subjective:   Patient seen at bedside.  Comfortable.  Patient was dialyzed earlier this morning.  Tolerated 2 L of fluid removal.  Objective:  Vital signs: Blood pressure 115/80, pulse 81, temperature 98.3 F (36.8 C), temperature source Oral, resp. rate (!) 24, height 5' 5 (1.651 m), weight 96 kg, SpO2 (!) 86%.  Intake/Output Summary (Last 24 hours) at 05/10/2024 1341 Last data filed at 05/10/2024 0827 Gross per 24 hour  Intake 488.47 ml  Output 2250 ml  Net -1761.53 ml   Filed Weights   05/10/24 0147 05/10/24 0428 05/10/24 0436  Weight: 95.4 kg 93 kg 96 kg     Physical Exam: General:  No acute distress  Head:  Normocephalic, atraumatic. Moist oral mucosal membranes  Eyes:  Anicteric  Neck:  Supple  Lungs:   Clear to auscultation, normal effort  Heart:  S1S2 no rubs  Abdomen:   Soft, nontender, bowel sounds present  Extremities:  peripheral edema.  Neurologic:  Awake, alert, following commands  Skin:  No lesions  Access:     Basic Metabolic Panel: Recent Labs  Lab 05/09/24 2229  NA 137  K 5.3*  CL 97*  CO2 24  GLUCOSE 134*  BUN 67*  CREATININE 5.75*  CALCIUM 8.4*   GFR: Estimated Creatinine Clearance: 10.9 mL/min (A) (by C-G formula based on SCr of 5.75 mg/dL (H)).  Liver Function Tests: Recent Labs  Lab 05/09/24 2229  AST 28  ALT 9  ALKPHOS 80  BILITOT 0.3  PROT 6.3*  ALBUMIN  3.2*   No results for input(s): LIPASE, AMYLASE in the last 168 hours. No results for input(s): AMMONIA in the last 168 hours.  CBC: Recent Labs  Lab 05/09/24 2033  WBC 10.7*  NEUTROABS 6.9  HGB 10.8*  HCT 35.1*  MCV 104.2*  PLT 167     HbA1C: Hemoglobin A1C  Date/Time Value Ref Range Status  02/22/2022 12:00 AM 5.0  Final  07/26/2012 01:11 AM 6.2 4.2 - 6.3 % Final    Comment:    The American Diabetes Association recommends that a primary goal of therapy should be <7% and that physicians should reevaluate  the treatment regimen in patients with HbA1c values consistently >8%.    Hgb A1c MFr Bld  Date/Time Value Ref Range Status  01/16/2024 10:20 AM 5.1 4.8 - 5.6 % Final    Comment:             Prediabetes: 5.7 - 6.4          Diabetes: >6.4          Glycemic control for adults with diabetes: <7.0   09/20/2023 10:03 AM 5.0 4.8 - 5.6 % Final    Comment:             Prediabetes: 5.7 - 6.4          Diabetes: >6.4          Glycemic control for adults with diabetes: <7.0     Urinalysis: No results for input(s): COLORURINE, LABSPEC, PHURINE, GLUCOSEU, HGBUR, BILIRUBINUR, KETONESUR, PROTEINUR, UROBILINOGEN, NITRITE, LEUKOCYTESUR in the last 72 hours.  Invalid input(s): APPERANCEUR    Imaging: DG Chest Portable 1 View Result Date: 05/09/2024 CLINICAL DATA:  Short of breath EXAM: PORTABLE CHEST 1 VIEW COMPARISON:  12/04/2023, 09/06/2022 FINDINGS: Cardiomegaly with vascular congestion and diffuse interstitial opacities suspected to represent pulmonary edema. Aortic atherosclerosis. No pleural effusion or pneumothorax. IMPRESSION: Cardiomegaly with vascular congestion and diffuse interstitial  opacities suspected to represent pulmonary edema. Electronically Signed   By: Luke Bun M.D.   On: 05/09/2024 20:59     Medications:    albumin  human Stopped (05/10/24 0435)   ceFEPime  (MAXIPIME ) IV     doxycycline  (VIBRAMYCIN ) IV      aspirin  EC  81 mg Oral Daily   busPIRone   5 mg Oral QHS   calcitRIOL   0.25 mcg Oral Daily   Chlorhexidine  Gluconate Cloth  6 each Topical Q0600   citalopram   40 mg Oral Daily   ezetimibe   10 mg Oral Daily   furosemide   40 mg Intravenous Once   furosemide   40 mg Intravenous BID   guaiFENesin   600 mg Oral BID   heparin   5,000 Units Subcutaneous Q8H   pantoprazole   40 mg Oral Daily    Assessment/ Plan:     79 year old male with history of ESRD on hemodialysis Monday Wednesday Friday schedule, COPD on home oxygen , obstructive sleep  apnea, diabetes, hypertension, history of TAVR now admitted with history of acute respiratory failure and was initially placed on BiPAP.  Patient was dialyzed last night.  #1: ESRD: Patient was dialyzed earlier this morning with 2 L of fluid removal.  Patient is advised on the importance of fluid restriction.  #2: Anemia: Anemia secondary to chronic kidney disease: Will monitor closely.  #3: Respiratory failure/pneumonia: Patient was started on cefepime  and doxycycline .  #4: CHF: Continue furosemide  on as-needed basis.  Patient need to be on fluid restriction.  #5: Secondary hyperparathyroidism: Will monitor PTH, calcium and phosphorus levels for  Labs and medications reviewed. Will continue to follow along with you.   LOS: 0 Pinkey Edman, MD [facility] 1/17/20261:41 PM  "

## 2024-05-10 NOTE — ED Notes (Signed)
 Patient was stand pivot turned into bedside recliner. Patents O2 on 6L [place] 93% to 95%. Patient states he feels more comfortable sitting up in the recliner.

## 2024-05-10 NOTE — Assessment & Plan Note (Signed)
 Acute exacerbation not suspected at this time Continue home inhalers DuoNebs as needed

## 2024-05-10 NOTE — Assessment & Plan Note (Signed)
 Continue buspirone and citalopram.

## 2024-05-10 NOTE — ED Notes (Signed)
 Patient pulled off Bi-pap, Patient spilled coffee on himself. No burns were noted. Patient stated they were going to eat since the food was brought. RN stated he probably needed to stay on the Bi-pap. Patient refused and stated he wanted to eat. Patient did allow RN to applied 6L [place]. O2 has maintained 90% to 94% while patient has been eating.

## 2024-05-10 NOTE — ED Notes (Signed)
 RT in room with RN. RN explained situation with RT, RT in aggreeance to keep patient at 6L [place]. Monitor patients resp stauts. Patient instructed.

## 2024-05-10 NOTE — ED Notes (Signed)
 Pt reported to Dialysis RN that he started to feel cramps in his legs, Dialysis was stopped early.

## 2024-05-10 NOTE — Assessment & Plan Note (Addendum)
 BP as low as 90s over 40s in the ED in the setting of acute respiratory tract infection Possibly related to sepsis, versus home antihypertensives Treating SIRS/sepsis as outlined on the specific problem May need pressors if unable to maintain MAP over 65 Close hemodynamic monitoring Addendum: Improving on reassessment during dialysis.  Albumin  to be administered if needed

## 2024-05-10 NOTE — Assessment & Plan Note (Addendum)
 Acute respiratory failure with hypoxia Volume overload secondary to missed dialysis Hypotension precluding Lasix  or nitrates at this time Nephrology arranging for urgent dialysis Continue BiPAP Will have to hold home metolazone, metoprolol  Will update echocardiogram Expecting improvement with dialysis If dialysis delayed and blood pressure remaining low, will transfer to stepdown for closer cardiopulmonary monitoring and pressor support Addendum: Patient getting dialyzed.  Appears more comfortable.  BP improving.  Spoke with dialysis nurse.  Albumin  will be administered if needed to have blood pressure

## 2024-05-10 NOTE — Progress Notes (Signed)
 [facility] Progress Note  Patient: [name] [name]    FMW:[id number]  DOA: 05/09/2024     Date of Service: the patient was seen and examined on 05/10/2024  Chief Complaint  Patient presents with   Respiratory Distress   Brief hospital course: JAHVIER ALDEA is a 79 y.o. 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  History provided by wife at bedside.  Patient 's last session was Monday Jan 12. He missed his Friday dialysis session due to feeling unwell with fever cough and malaise.  Later in the day he went into acute respiratory distress.  EMS brought him in on CPAP. In the ED, he was febrile to 100.8 heart rate 96 and tachypneic to the high 20s with O2 sat of 100% on CPAP.  BP was initially normal at 115/67, trending down to as low as 91/44 during workup in the ED. VBG on BiPAP with pH 7.25 and pCO2 58. Labs otherwise notable for WBC 10.7 with lactic acid 1.8 and negative respiratory viral panel. Hemoglobin at baseline at 10.8. CMP with results in keeping with dialysis status.  Mild hyperkalemia 5.3 Troponin 68 and proBNP> 35,000 Chest x-ray consistent with pulmonary edema.   The ED provider spoke with nephrologist, Dr. Dominica, who will be making arrangements for urgent dialysis or dialysis first thing in the AM.   Patient was started on cefepime  and doxycycline  for possible pneumonia   Admission requested    Assessment and Plan:   # Acute on chronic combined systolic and diastolic CHF (congestive heart failure) # Acute respiratory failure with  hypoxia # Volume overload secondary to missed dialysis # ESRD on hemodialysis M&F Patient was hypotensive that is why Lasix  was not given on admission  Nephrology was consulted, patient got hemodialysis done. Continue BiPAP as needed Continue supplemental O2 inhalation and gradually wean down Patient was on metolazone and metoprolol  at home Follow-up TTE 1/17 started Lasix  40 mg IV twice daily Nephrology following, patient received hemodialysis early morning today, will be hemodialysis per nephrology.    # Hypotension BP as low as 90s over 40s in the ED in the setting of acute respiratory tract infection Possibly related to sepsis, versus home antihypertensives Treating SIRS/sepsis as outlined on the specific problem May need pressors if unable to maintain MAP over 65 Close hemodynamic monitoring Addendum: Improving on reassessment during dialysis.  Albumin  to be administered if needed 1/17 started midodrine  5 mg p.o. 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 Holding metoprolol  due to low blood pressure    Anxiety and depression Continue buspirone  and citalopram      Body mass index is 35.22 kg/m.  Interventions:  Diet: Heart healthy diet DVT Prophylaxis: Subcutaneous Heparin     Advance goals of care discussion: Full code  Family Communication: family was not present at bedside, at the time of interview.  The pt provided permission to discuss medical plan with the family. Opportunity was given to ask question and all questions were answered satisfactorily.   Disposition:  Pt is from Home , admitted with volume overload, ESRD missed hemodialysis, still has shortness of breath, which precludes a safe discharge. Discharge to home, when stable, most likely in few days.  Subjective: No significant event overnight.  Patient received hemodialysis in the morning, feels a little bit improvement, still has significant shortness of breath.  Denied any chest pain or palpitation, no any other active issues.  Physical Exam: General: NAD, lying comfortably Appear in no distress, affect appropriate Eyes: PERRLA ENT: Oral Mucosa Clear, moist  Neck: no JVD,  Cardiovascular: S1 and S2 Present, no Murmur,  Respiratory: good air entry bilaterally, bilateral crackles, no significant wheezes Abdomen: Bowel Sound present, Soft and no tenderness,  Skin: no rashes Extremities: 3-4+ b/l Pedal edema Neurologic: without any new focal findings Gait not checked due to patient safety concerns  Vitals:   05/10/24 0800 05/10/24 0830 05/10/24 1000 05/10/24 1015  BP: 113/69  112/77   Pulse: 82  79   Resp: (!) 26  (!) 26 20  Temp:  98.3 F (36.8 C)    TempSrc:  Oral    SpO2: 93%  95%   Weight:      Height:        Intake/Output Summary (Last 24 hours) at 05/10/2024 1243 Last data filed at 05/10/2024 0827 Gross per 24 hour  Intake 488.47 ml  Output 2250 ml  Net -1761.53 ml   Filed Weights   05/10/24 0147 05/10/24 0428 05/10/24 0436  Weight: 95.4 kg 93 kg 96 kg    Data Reviewed: I have personally  reviewed and interpreted daily labs, tele strips, imagings as discussed above. I reviewed all nursing notes, pharmacy notes, vitals, pertinent old records I have discussed plan of care as described above with RN and patient/family.  CBC: Recent Labs  Lab 05/09/24 2033  WBC 10.7*  NEUTROABS 6.9  HGB 10.8*  HCT 35.1*  MCV 104.2*  PLT 167   Basic Metabolic Panel: Recent Labs  Lab 05/09/24 2229  NA 137  K 5.3*  CL 97*  CO2 24  GLUCOSE 134*  BUN 67*  CREATININE 5.75*  CALCIUM 8.4*    Studies: DG Chest Portable 1 View Result Date: 05/09/2024 CLINICAL DATA:  Short of breath EXAM: PORTABLE CHEST 1 VIEW COMPARISON:  12/04/2023, 09/06/2022 FINDINGS: Cardiomegaly with vascular congestion and diffuse interstitial opacities suspected to represent pulmonary edema. Aortic atherosclerosis. No pleural effusion or pneumothorax. IMPRESSION: Cardiomegaly with vascular congestion and diffuse interstitial opacities suspected to represent pulmonary edema. Electronically Signed   By: Luke Bun M.D.   On: 05/09/2024 20:59    Scheduled Meds:  aspirin  EC  81 mg Oral Daily   busPIRone   5 mg Oral QHS   calcitRIOL   0.25 mcg Oral Daily   Chlorhexidine  Gluconate Cloth  6 each Topical Q0600   citalopram   40 mg  Oral Daily   ezetimibe   10 mg Oral Daily   guaiFENesin   600 mg Oral BID   heparin   5,000 Units Subcutaneous Q8H   pantoprazole   40 mg Oral Daily   Continuous Infusions:  albumin  human Stopped (05/10/24 0435)   PRN Meds: acetaminophen  **OR** acetaminophen , ipratropium-albuterol , ondansetron  **OR** ondansetron  (ZOFRAN ) IV, oxyCODONE   Time spent: 35 minutes  Author: ELVAN SOR. MD [facility] 05/10/2024 12:43 PM  To reach On-call, see care teams to locate the attending and reach out to them via [email or web address]. If 7PM-7AM, please contact night-coverage If you still have difficulty reaching the attending provider, please page the [facility] (Director on Call) for [facility] on  amion for assistance.

## 2024-05-10 NOTE — Assessment & Plan Note (Addendum)
 Anemia of ESRD Patient only dialyzes twice a week on Mondays and Fridays Nephrology arranging urgent dialysis

## 2024-05-10 NOTE — Assessment & Plan Note (Signed)
No acute issues suspected at this time

## 2024-05-10 NOTE — ED Notes (Signed)
 Replaced finger O2 sensor with ear sensor for more accurate reading. O2 of bi-pap at 94%.

## 2024-05-11 DIAGNOSIS — I5043 Acute on chronic combined systolic (congestive) and diastolic (congestive) heart failure: Secondary | ICD-10-CM | POA: Diagnosis not present

## 2024-05-11 LAB — BASIC METABOLIC PANEL WITH GFR
Anion gap: 14 (ref 5–15)
BUN: 56 mg/dL — ABNORMAL HIGH (ref 8–23)
CO2: 27 mmol/L (ref 22–32)
Calcium: 8.9 mg/dL (ref 8.9–10.3)
Chloride: 95 mmol/L — ABNORMAL LOW (ref 98–111)
Creatinine, Ser: 4.69 mg/dL — ABNORMAL HIGH (ref 0.61–1.24)
GFR, Estimated: 12 mL/min — ABNORMAL LOW
Glucose, Bld: 87 mg/dL (ref 70–99)
Potassium: 4.1 mmol/L (ref 3.5–5.1)
Sodium: 135 mmol/L (ref 135–145)

## 2024-05-11 LAB — CBC
HCT: 31.5 % — ABNORMAL LOW (ref 39.0–52.0)
Hemoglobin: 10 g/dL — ABNORMAL LOW (ref 13.0–17.0)
MCH: 31.4 pg (ref 26.0–34.0)
MCHC: 31.7 g/dL (ref 30.0–36.0)
MCV: 99.1 fL (ref 80.0–100.0)
Platelets: 130 K/uL — ABNORMAL LOW (ref 150–400)
RBC: 3.18 MIL/uL — ABNORMAL LOW (ref 4.22–5.81)
RDW: 15.6 % — ABNORMAL HIGH (ref 11.5–15.5)
WBC: 6.8 K/uL (ref 4.0–10.5)
nRBC: 0 % (ref 0.0–0.2)

## 2024-05-11 LAB — PHOSPHORUS: Phosphorus: 6.6 mg/dL — ABNORMAL HIGH (ref 2.5–4.6)

## 2024-05-11 LAB — HEPATITIS B SURFACE ANTIBODY, QUANTITATIVE: Hep B S AB Quant (Post): <3.5 m[IU]/mL — ABNORMAL LOW

## 2024-05-11 LAB — GLUCOSE, CAPILLARY: Glucose-Capillary: 146 mg/dL — ABNORMAL HIGH (ref 70–99)

## 2024-05-11 LAB — MAGNESIUM: Magnesium: 2.2 mg/dL (ref 1.7–2.4)

## 2024-05-11 MED ORDER — DOXYCYCLINE HYCLATE 100 MG PO TABS
100.0000 mg | ORAL_TABLET | Freq: Two times a day (BID) | ORAL | Status: DC
  Administered 2024-05-12 – 2024-05-15 (×5): 100 mg via ORAL
  Filled 2024-05-11 (×5): qty 1

## 2024-05-11 NOTE — Progress Notes (Signed)
 Pt's BiPap was set up as well as inspwcted.  [facility] was called to come look at on Monday 05-12-24

## 2024-05-11 NOTE — Progress Notes (Signed)
 [facility] Progress Note  Patient: [name] [name]    FMW:[id number]  DOA: 05/09/2024     Date of Service: the patient was seen and examined on 05/11/2024  Chief Complaint  Patient presents with   Respiratory Distress   Brief hospital course: [name] [name] is a 79 y.o. 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  History provided by wife at bedside.  Patient 's last session was Monday Jan 12. He missed his Friday dialysis session due to feeling unwell with fever cough and malaise.  Later in the day he went into acute respiratory distress.  EMS brought him in on CPAP. In the ED, he was febrile to 100.8 heart rate 96 and tachypneic to the high 20s with O2 sat of 100% on CPAP.  BP was initially normal at 115/67, trending down to as low as 91/44 during workup in the ED. VBG on BiPAP with pH 7.25 and pCO2 58. Labs otherwise notable for WBC 10.7 with lactic acid 1.8 and negative respiratory viral panel. Hemoglobin at baseline at 10.8. CMP with results in keeping with dialysis status.  Mild hyperkalemia 5.3 Troponin 68 and proBNP> 35,000 Chest x-ray consistent with pulmonary edema.   The ED provider spoke with nephrologist, Dr. Dominica, who will be making arrangements for urgent dialysis or dialysis first thing in the AM.   Patient was started on cefepime  and doxycycline  for possible pneumonia   Admission requested    Assessment and Plan:   # Acute on chronic combined systolic and diastolic CHF (congestive heart failure) # Severe aortic valve stenosis, severe  MR, moderate TR # Acute respiratory failure with hypoxia # Volume overload secondary to missed dialysis # ESRD on hemodialysis M&F Patient was hypotensive that is why Lasix  was not given on admission  Nephrology was consulted, patient got hemodialysis done. Continue BiPAP as needed Continue supplemental O2 inhalation and gradually wean down Patient was on metolazone and metoprolol  at home TTE LVEF 25 to 30%, grade 2 DD, severe TR, moderate MR, severe AAS 1/17 started Lasix  40 mg IV twice daily Nephrology following, patient received hemodialysis early morning today, will be hemodialysis per nephrology. 1/18 cardiology consulted for further recommendation due to worsening of CHF and significant valvular abnormality.   # Hypotension BP as low as 90s over 40s in the ED in the setting of acute respiratory tract infection Possibly related to sepsis, versus home antihypertensives Treating SIRS/sepsis as outlined on the specific problem May need pressors if unable to maintain MAP over 65 Close hemodynamic monitoring Addendum: Improving on reassessment during dialysis.  Albumin  to be administered if needed 1/17 started midodrine  5 mg p.o. 3 times daily as needed if SBP less than 100 mmHg   # Acute respiratory tract infection, possibly lower SIRS, possible sepsis SIRS criteria include fever, tachycardia, tachypnea with leukocytosis Patient presented with cough, fever and malaise, negative respiratory viral panel Continue empiric treatment with cefepime  and doxycycline  for possible lower respiratory tract infection Antitussives Supplemental oxygen    ESRD on hemodialysis  Anemia of ESRD Patient only dialyzes twice a week on Mondays and Fridays Nephrology arranging urgent dialysis 1/18  as per patient's wife patient is scheduled for AV fistulogram by vascular surgery on 1/20 We will consult vascular surgery tomorrow a.m.    Severe aortic stenosis S/P TAVR (transcatheter aortic valve  replacement) No acute issues suspected at this time   OSA on CPAP CPAP nightly once weaned off BiPAP   COPD (chronic obstructive pulmonary disease) (HCC) Acute exacerbation not suspected at this time Continue home inhalers DuoNebs as needed   Coronary artery disease Continue home aspirin  and ezetimibe . Holding metoprolol  due to low blood pressure   Anxiety and depression Continue buspirone  and citalopram      Body mass index is 29.68 kg/m.  Interventions:  Diet: Heart healthy diet DVT Prophylaxis: Subcutaneous Heparin     Advance goals of care discussion: Full code  Family Communication: family was not present at bedside, at the time of interview.  The pt provided permission to discuss medical plan with the family. Opportunity was given to ask question and all questions were answered satisfactorily.   Disposition:  Pt is from Home , admitted with volume overload, ESRD missed hemodialysis, still has shortness of breath, which precludes a safe discharge. Discharge to home, when stable, most likely in few days.  Subjective: No significant event overnight.  Patient feels improvement in the shortness of breath, still has difficulty breathing not back to his baseline.   Physical Exam: General: NAD, lying comfortably Appear in no distress, affect appropriate Eyes: PERRLA ENT: Oral Mucosa Clear, moist  Neck: no JVD,  Cardiovascular: S1 and S2 Present, audible murmur,  Respiratory: good air entry bilaterally, bilateral crackles, no significant wheezes Abdomen: Bowel Sound present, Soft and no tenderness,  Skin: no rashes Extremities: 3-4+ b/l Pedal edema Neurologic: without any new focal findings Gait not checked due to patient safety concerns  Vitals:   05/11/24 0500 05/11/24 0830 05/11/24 1148 05/11/24 1625  BP:  (!) 113/51 (!) 111/53 (!) 115/56  Pulse:  83 82 85  Resp:  20 18 16   Temp:  97.6 F (36.4 C) 97.6 F (36.4 C) 98 F (36.7 C)  TempSrc:    Oral  SpO2:   91% 96% 99%  Weight: 80.9 kg     Height:        Intake/Output Summary (Last 24 hours) at 05/11/2024 1759 Last data filed at 05/11/2024 1037 Gross per 24 hour  Intake 120 ml  Output 1 ml  Net 119 ml   Filed Weights   05/10/24 0428 05/10/24 0436 05/11/24 0500  Weight: 93 kg 96 kg 80.9 kg    Data Reviewed: I have personally reviewed and interpreted daily labs, tele strips, imagings as discussed above. I reviewed all nursing notes, pharmacy notes, vitals, pertinent old records I have discussed plan of care as described above with RN and patient/family.  CBC: Recent Labs  Lab 05/09/24 2033 05/11/24 0403  WBC 10.7* 6.8  NEUTROABS 6.9  --   HGB 10.8* 10.0*  HCT 35.1* 31.5*  MCV 104.2* 99.1  PLT 167 130*   Basic Metabolic Panel: Recent Labs  Lab 05/09/24 2229 05/11/24 0403  NA 137 135  K 5.3* 4.1  CL 97* 95*  CO2 24 27  GLUCOSE 134* 87  BUN 67* 56*  CREATININE 5.75* 4.69*  CALCIUM 8.4* 8.9  MG  --  2.2  PHOS  --  6.6*    Studies: No results found.   Scheduled Meds:  aspirin  EC  81 mg Oral Daily   busPIRone   5 mg Oral QHS   calcitRIOL   0.25 mcg  Oral Daily   Chlorhexidine  Gluconate Cloth  6 each Topical Q0600   citalopram   40 mg Oral Daily   [START ON 05/12/2024] doxycycline   100 mg Oral Q12H   ezetimibe   10 mg Oral Daily   furosemide   40 mg Intravenous BID   guaiFENesin   600 mg Oral BID   heparin   5,000 Units Subcutaneous Q8H   pantoprazole   40 mg Oral Daily   Continuous Infusions:  albumin  human Stopped (05/10/24 0435)   ceFEPime  (MAXIPIME ) IV 1 g (05/10/24 2037)   PRN Meds: acetaminophen  **OR** acetaminophen , ipratropium-albuterol , midodrine , ondansetron  **OR** ondansetron  (ZOFRAN ) IV, oxyCODONE   Time spent: 55 minutes  Author: ELVAN SOR. MD [facility] 05/11/2024 5:59 PM  To reach On-call, see care teams to locate the attending and reach out to them via [email or web address]. If 7PM-7AM, please contact night-coverage If you still have  difficulty reaching the attending provider, please page the [facility] (Director on Call) for [facility] on amion for assistance.

## 2024-05-11 NOTE — Plan of Care (Signed)

## 2024-05-11 NOTE — Progress Notes (Signed)
 " [facility]  PROGRESS NOTE   Subjective:   Sitting out of bed to chair.  He ate well today.  Feels much better.  Objective:  Vital signs: Blood pressure (!) 111/53, pulse 82, temperature 97.6 F (36.4 C), resp. rate 18, height 5' 5 (1.651 m), weight 80.9 kg, SpO2 96%.  Intake/Output Summary (Last 24 hours) at 05/11/2024 1524 Last data filed at 05/11/2024 1037 Gross per 24 hour  Intake 120 ml  Output 1 ml  Net 119 ml   Filed Weights   05/10/24 0428 05/10/24 0436 05/11/24 0500  Weight: 93 kg 96 kg 80.9 kg     Physical Exam: General:  No acute distress  Head:  Normocephalic, atraumatic. Moist oral mucosal membranes  Eyes:  Anicteric  Neck:  Supple  Lungs:   Clear to auscultation, normal effort  Heart:  S1S2 no rubs  Abdomen:   Soft, nontender, bowel sounds present  Extremities:  peripheral edema.  Neurologic:  Awake, alert, following commands  Skin:  No lesions  Access:     Basic Metabolic Panel: Recent Labs  Lab 05/09/24 2229 05/11/24 0403  NA 137 135  K 5.3* 4.1  CL 97* 95*  CO2 24 27  GLUCOSE 134* 87  BUN 67* 56*  CREATININE 5.75* 4.69*  CALCIUM 8.4* 8.9  MG  --  2.2  PHOS  --  6.6*   GFR: Estimated Creatinine Clearance: 12.3 mL/min (A) (by C-G formula based on SCr of 4.69 mg/dL (H)).  Liver Function Tests: Recent Labs  Lab 05/09/24 2229  AST 28  ALT 9  ALKPHOS 80  BILITOT 0.3  PROT 6.3*  ALBUMIN  3.2*   No results for input(s): LIPASE, AMYLASE in the last 168 hours. No results for input(s): AMMONIA in the last 168 hours.  CBC: Recent Labs  Lab 05/09/24 2033 05/11/24 0403  WBC 10.7* 6.8  NEUTROABS 6.9  --   HGB 10.8* 10.0*  HCT 35.1* 31.5*  MCV 104.2* 99.1  PLT 167 130*     HbA1C: Hemoglobin A1C  Date/Time Value Ref Range Status  02/22/2022 12:00 AM 5.0  Final  07/26/2012 01:11 AM 6.2 4.2 - 6.3 % Final    Comment:    The American Diabetes Association recommends that a primary goal of therapy should be <7%  and that physicians should reevaluate the treatment regimen in patients with HbA1c values consistently >8%.    Hgb A1c MFr Bld  Date/Time Value Ref Range Status  01/16/2024 10:20 AM 5.1 4.8 - 5.6 % Final    Comment:             Prediabetes: 5.7 - 6.4          Diabetes: >6.4          Glycemic control for adults with diabetes: <7.0   09/20/2023 10:03 AM 5.0 4.8 - 5.6 % Final    Comment:             Prediabetes: 5.7 - 6.4          Diabetes: >6.4          Glycemic control for adults with diabetes: <7.0     Urinalysis: No results for input(s): COLORURINE, LABSPEC, PHURINE, GLUCOSEU, HGBUR, BILIRUBINUR, KETONESUR, PROTEINUR, UROBILINOGEN, NITRITE, LEUKOCYTESUR in the last 72 hours.  Invalid input(s): APPERANCEUR    Imaging: ECHOCARDIOGRAM COMPLETE Result Date: 05/10/2024    ECHOCARDIOGRAM REPORT   Patient Name:   [name] [name] Date of Exam: 05/10/2024 Medical Rec #:  [id number]  Height:       65.0 in Accession #:    [id number]           Weight:       211.6 lb Date of Birth:  Apr 01, 1946           BSA:          2.026 m Patient Age:    78 years             BP:           113/69 mmHg Patient Gender: M                    HR:           78 bpm. Exam Location:  [facility] Procedure: 2D Echo, Cardiac Doppler, Color Doppler and Strain Analysis (Both            Spectral and Color Flow Doppler were utilized during procedure). Indications:     CHF-Acute Diastolic I50.31  History:         Patient has prior history of Echocardiogram examinations. CAD.  Sonographer:     Bari Roar Referring Phys:  [id number] DELAYNE LULLA SOLIAN Diagnosing Phys: Cara JONETTA Lovelace MD  Sonographer Comments: Global longitudinal strain was attempted. IMPRESSIONS  1. Left ventricular ejection fraction, by estimation, is 25 to 30%. The left ventricle has severely decreased function. The left ventricle demonstrates global hypokinesis. The left ventricular internal cavity size was moderately to severely  dilated. There is mild concentric left ventricular hypertrophy. Left ventricular diastolic parameters are consistent with Grade II diastolic dysfunction (pseudonormalization). The average left ventricular global longitudinal strain is 7.5 %. The global longitudinal strain is abnormal.  2. Right ventricular systolic function is mildly reduced. The right ventricular size is mildly enlarged.  3. Left atrial size was mild to moderately dilated.  4. Right atrial size was moderately dilated.  5. The mitral valve is abnormal. Moderate mitral valve regurgitation.  6. Tricuspid valve regurgitation is severe.  7. The aortic valve is calcified. There is severe calcifcation of the aortic valve. There is moderate thickening of the aortic valve. Aortic valve regurgitation is mild. Severe aortic valve stenosis. FINDINGS  Left Ventricle: Left ventricular ejection fraction, by estimation, is 25 to 30%. The left ventricle has severely decreased function. The left ventricle demonstrates global hypokinesis. The average left ventricular global longitudinal strain is 7.5 %. Strain was performed and the global longitudinal strain is abnormal. The left ventricular internal cavity size was moderately to severely dilated. There is mild concentric left ventricular hypertrophy. Left ventricular diastolic parameters are consistent  with Grade II diastolic dysfunction (pseudonormalization). Right Ventricle: The right ventricular size is mildly enlarged. No increase in right ventricular wall thickness. Right ventricular systolic function is mildly reduced. Left Atrium: Left atrial size was mild to moderately dilated. Right Atrium: Right atrial size was moderately dilated. Pericardium: The pericardium was not well visualized. Mitral Valve: The mitral valve is abnormal. Moderate mitral valve regurgitation. MV peak gradient, 18.0 mmHg. The mean mitral valve gradient is 8.0 mmHg. Tricuspid Valve: The tricuspid valve is grossly normal. Tricuspid valve  regurgitation is severe. Aortic Valve: The aortic valve is calcified. There is severe calcifcation of the aortic valve. There is moderate thickening of the aortic valve. There is moderate to severe aortic valve annular calcification. Aortic valve regurgitation is mild. Severe aortic stenosis is present. Aortic valve mean gradient measures 11.0 mmHg. Aortic valve peak gradient measures 18.7 mmHg. Aortic valve area, by VTI  measures 0.59 cm. Pulmonic Valve: The pulmonic valve was not well visualized. Pulmonic valve regurgitation is not visualized. Aorta: The aortic root was not well visualized. IAS/Shunts: No atrial level shunt detected by color flow Doppler. Additional Comments: 3D was performed not requiring image post processing on an independent workstation and was indeterminate.  LEFT VENTRICLE PLAX 2D LVIDd:         5.60 cm      Diastology LVIDs:         4.90 cm      LV e' medial:    4.33 cm/s LV PW:         1.30 cm      LV E/e' medial:  42.7 LV IVS:        1.20 cm      LV e' lateral:   3.92 cm/s LVOT diam:     1.60 cm      LV E/e' lateral: 47.2 LV SV:         25 LV SV Index:   13           2D Longitudinal Strain LVOT Area:     2.01 cm     2D Strain GLS (A4C):   7.0 %                             2D Strain GLS (A3C):   8.3 %                             2D Strain GLS (A2C):   7.4 % LV Volumes (MOD)            2D Strain GLS Avg:     7.5 % LV vol d, MOD A2C: 156.0 ml LV vol d, MOD A4C: 128.0 ml LV vol s, MOD A2C: 111.0 ml LV vol s, MOD A4C: 91.4 ml LV SV MOD A2C:     45.0 ml LV SV MOD A4C:     128.0 ml LV SV MOD BP:      40.2 ml RIGHT VENTRICLE RV Basal diam:  4.20 cm RV Mid diam:    3.60 cm RV S prime:     8.22 cm/s TAPSE (M-mode): 1.7 cm LEFT ATRIUM              Index        RIGHT ATRIUM           Index LA diam:        4.70 cm  2.32 cm/m   RA Area:     21.50 cm LA Vol (A2C):   101.0 ml 49.85 ml/m  RA Volume:   66.70 ml  32.92 ml/m LA Vol (A4C):   93.6 ml  46.19 ml/m LA Biplane Vol: 98.0 ml  48.36 ml/m   AORTIC VALVE                     PULMONIC VALVE AV Area (Vmax):    0.56 cm      PV Vmax:          1.24 m/s AV Area (Vmean):   0.55 cm      PV Peak grad:     6.2 mmHg AV Area (VTI):     0.59 cm      PR End Diast Vel: 4.93 msec AV Vmax:           216.00 cm/s  RVOT Peak grad:   1 mmHg AV Vmean:          155.000 cm/s AV VTI:            0.428 m AV Peak Grad:      18.7 mmHg AV Mean Grad:      11.0 mmHg LVOT Vmax:         60.00 cm/s LVOT Vmean:        42.200 cm/s LVOT VTI:          0.126 m LVOT/AV VTI ratio: 0.29  AORTA Ao Root diam: 2.90 cm Ao Asc diam:  3.10 cm MITRAL VALVE                TRICUSPID VALVE MV Area (PHT): 3.17 cm     TR Peak grad:   48.7 mmHg MV Area VTI:   0.47 cm     TR Vmax:        349.00 cm/s MV Peak grad:  18.0 mmHg MV Mean grad:  8.0 mmHg     SHUNTS MV Vmax:       2.12 m/s     Systemic VTI:  0.13 m MV Vmean:      129.0 cm/s   Systemic Diam: 1.60 cm MV Decel Time: 239 msec MR Peak grad: 93.7 mmHg MR Vmax:      484.00 cm/s MV E velocity: 185.00 cm/s MV A velocity: 152.00 cm/s MV E/A ratio:  1.22 MV A Prime:    7.8 cm/s Cara JONETTA Lovelace MD Electronically signed by Cara JONETTA Lovelace MD Signature Date/Time: 05/10/2024/2:29:31 PM    Final    DG Chest Portable 1 View Result Date: 05/09/2024 CLINICAL DATA:  Short of breath EXAM: PORTABLE CHEST 1 VIEW COMPARISON:  12/04/2023, 09/06/2022 FINDINGS: Cardiomegaly with vascular congestion and diffuse interstitial opacities suspected to represent pulmonary edema. Aortic atherosclerosis. No pleural effusion or pneumothorax. IMPRESSION: Cardiomegaly with vascular congestion and diffuse interstitial opacities suspected to represent pulmonary edema. Electronically Signed   By: Luke Bun M.D.   On: 05/09/2024 20:59     Medications:    albumin  human Stopped (05/10/24 0435)   ceFEPime  (MAXIPIME ) IV 1 g (05/10/24 2037)    aspirin  EC  81 mg Oral Daily   busPIRone   5 mg Oral QHS   calcitRIOL   0.25 mcg Oral Daily   Chlorhexidine  Gluconate Cloth  6 each  Topical Q0600   citalopram   40 mg Oral Daily   [START ON 05/12/2024] doxycycline   100 mg Oral Q12H   ezetimibe   10 mg Oral Daily   furosemide   40 mg Intravenous BID   guaiFENesin   600 mg Oral BID   heparin   5,000 Units Subcutaneous Q8H   pantoprazole   40 mg Oral Daily    Assessment/ Plan:     79 year old male with history of ESRD on hemodialysis Monday Wednesday Friday schedule, COPD on home oxygen , obstructive sleep apnea, diabetes, hypertension, history of TAVR now admitted with history of acute respiratory failure and was initially placed on BiPAP.  Patient was dialyzed last night.   #1: ESRD: Patient was dialyzed yesterday morning with 2 L of fluid removal.  Patient is advised on the importance of fluid restriction.  Will schedule for dialysis tomorrow morning.   #2: Anemia: Anemia secondary to chronic kidney disease: Will monitor closely.   #3: Respiratory failure/pneumonia: Patient was started on cefepime  and doxycycline .   #4: CHF: Continue furosemide  on as-needed basis.  Patient need to be on fluid restriction.   #5:  Secondary hyperparathyroidism: Will monitor PTH, calcium and phosphorus levels for  Labs and medications reviewed. Will continue to follow along with you.   LOS: 1 Pinkey Edman, MD [facility] 1/18/20263:24 PM  "

## 2024-05-11 NOTE — Consult Note (Signed)
 " CARDIOLOGY CONSULT NOTE               Patient ID: [name] [name] MRN: [id number] DOB/AGE: 81/10/1945 81 y.o.  Admit date: 05/09/2024 Referring Physician Dr Elvan Sor hospitalist Primary Physician Alan Arrant, FNP at [facility] medical Primary Cardiologist Dr. GORMAN Bathe cardiology Reason for Consultation shortness of breath respiratory distress  HPI: Patient is a 79 year old male history of end-stage renal disease COPD respiratory failure acute hypoxic respiratory failure status post TAVR for aortic stenosis hypoxemia hypertension obesity diabetes states that while at home normally has dialysis regularly but then started having significant dyspnea shortness of breath with.  He missed his Friday dialysis session he was not feeling well with cough and malaise later on the day went into respiratory distress and was brought in by EMS placed on CPAP slightly elevated temperature and appeared to be hypoxic and volume overloaded BNP was over 35,000 denies any significant chest pain  Review of systems complete and found to be negative unless listed above     Past Medical History:  Diagnosis Date   Accidental cut, puncture, perforation, or hemorrhage during heart catheterization 12/27/2012   Acute kidney injury superimposed on stage 4 chronic kidney disease (HCC) 07/27/2020   Acute on chronic combined systolic and diastolic CHF (congestive heart failure) (HCC) 04/25/2023   Acute on chronic respiratory failure with hypoxia (HCC) 04/22/2023   Adopted    AKI (acute kidney injury) 02/05/2023   ANA positive 07/12/2015   Aortic atherosclerosis    Aortic stenosis, severe    a.) s/p TAVR on 08/01/2016: 29 mm CoreValve Evolut placed   Arthritis    Bacteremia due to Streptococcus 04/24/2023   Basal cell carcinoma 01/04/2010   Right sup. post. helix. Excised 03/02/2010   BCC (basal cell carcinoma of skin) 11/24/2020   R neck below the ear, EDC   BPH (benign prostatic hyperplasia)    CAD  (coronary artery disease) 12/26/2012   a.) LHC 12/26/2012: 50% mLAD, 80% mLCx, 100% pRCA --> CVTS at [facility] --> decided again revascularization due to insig LAD disease and the fact that RCA lesion was chronic   CAP (community acquired pneumonia) 07/26/2020   Chronic respiratory failure with hypoxia (HCC)    Claudication of both lower extremities    Complete tear of right rotator cuff 11/15/2015   Complex tear of lateral meniscus of left knee as current injury 12/06/2020   Complex tear of medial meniscus of left knee as current injury 10/18/2020   COPD (chronic obstructive pulmonary disease) (HCC)    Diarrhea 02/04/2023   Diet-controlled type 2 diabetes mellitus (HCC)    Diverticulosis    Dyspnea    Electrolyte abnormality 02/04/2023   ESRD (end stage renal disease) (HCC)    GERD (gastroesophageal reflux disease)    Gout    Heart murmur    HFrEF (heart failure with reduced ejection fraction) (HCC)    a.) s/p TAVR on 08/01/2016: 29 mm CoreValve Evolut placed   History of bilateral cataract extraction    History of hiatal hernia    HLD (hyperlipidemia)    Hyperkalemia 07/30/2020   Hypertension    Hypomagnesemia 02/05/2023   Hypothyroidism    IDA (iron deficiency anemia)    Migraines    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   a.) TTE 04/24/2023: moderate (MPG 7.5 mmHG)   OSA on CPAP    Pneumonia 07/2020   Pulmonary hypertension (HCC)    a.) TTE 08/17/2022: mild pHTN per echo report; b.) TTE 04/24/2023:  RVSP 39.1   Recurrent nephrolithiasis 12/27/2012   Rotator cuff tendinitis, right 11/15/2015   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 MOHS.   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/[place])     Past Surgical History:  Procedure Laterality Date   AORTIC VALVE REPLACEMENT N/A 08/01/2016   29 mm CoreValve Evolut; Location: [facility]; Surgeon: Reyes Fruits, MD   AV FISTULA PLACEMENT Right 06/27/2023   Procedure: ARTERIOVENOUS (AV) FISTULA CREATION (BRACHIALCEPHALIC);  Surgeon: Jama Cordella MATSU, MD;  Location: [facility] ORS;  Service: Vascular;  Laterality: Right;   CARDIAC CATHETERIZATION N/A 12/26/2012   2v CAD; retained pigtail in myocardium --> transferred to [facility]; Location: [facility]; Surgeon: Vita Bathe, MD   CATARACT EXTRACTION W/PHACO Left 10/03/2022   Procedure: CATARACT EXTRACTION PHACO AND INTRAOCULAR LENS PLACEMENT (IOC) LEFT DIABETIC 8.22 00:55.5;  Surgeon: Jaye Fallow, MD;  Location: [facility];  Service: Ophthalmology;  Laterality: Left;  sleep apnea   CATARACT EXTRACTION W/PHACO Right 10/17/2022   Procedure: CATARACT EXTRACTION PHACO AND INTRAOCULAR LENS PLACEMENT (IOC) RIGHT DIABETIC;  Surgeon: Jaye Fallow, MD;  Location: [facility];  Service: Ophthalmology;  Laterality: Right;  4.79 0:37.4   COLONOSCOPY     COLONOSCOPY N/A 04/28/2023   Procedure: COLONOSCOPY;  Surgeon: Toledo, Ladell POUR, MD;  Location: [facility] ENDOSCOPY;  Service: Gastroenterology;  Laterality: N/A;   DIALYSIS/PERMA CATHETER INSERTION N/A 08/29/2023   Procedure: DIALYSIS/PERMA CATHETER INSERTION;  Surgeon: Jama Cordella MATSU, MD;  Location: [facility] IN[facility];  Service: Cardiovascular;  Laterality: N/A;   DIALYSIS/PERMA CATHETER REMOVAL N/A 11/12/2023   Procedure: DIALYSIS/PERMA CATHETER REMOVAL;  Surgeon: Marea Selinda RAMAN, MD;  Location: [facility] INV[facility] CV LAB;  Service: Cardiovascular;  Laterality: N/A;   KNEE ARTHROSCOPY WITH MEDIAL MENISECTOMY Left 12/02/2020   Procedure: KNEE ARTHROSCOPY WITH  DEBRIDEMENT AND PARTIAL MEDIAL AND LATERAL MENISECTOMY;  Surgeon: Edie Norleen PARAS, MD;  Location: [facility];  Service: Orthopedics;  Laterality: Left;   LITHOTRIPSY     PERCUTANEOUS REMOVAL INTRA-AORTIC BALLOON CATH N/A 12/26/2012   Procedure: PERCUTANEOUS REMOVAL INTRA-AORTIC BALLOON CATH; Surgeon: Lang Lannie Salter, MD; Location: [facility]PER[facility]; Service: Cardiothoracic   POLYPECTOMY  04/28/2023   Procedure: POLYPECTOMY;  Surgeon: Aundria, Ladell POUR, MD;  Location: [facility] ENDOS[facility];  Service: Gastroenterology;;   RIGHT HEART CATH Right 07/13/2016   Location: [facility]; Surgeon: Ozell Estelle, MD   TEE WITHOUT CARDIOVERSION Right 04/26/2023   Procedure: TRANSESOPHAGEAL ECHOCARDIOGRAM (TEE);  Surgeon: Bathe Denyse LABOR, MD;  Location: [facility];  Service: Cardiovascular;  Laterality: Right;   TRANSESOPHAGEAL ECHOCARDIOGRAM N/A 12/26/2012   Procedure: TRANSESOPHAGEAL ECHOCARDIOGRAPHY; Surgeon: Lang Lannie Salter, MD; Location: [facility]; Service: Cardiothoracic   UMBILICAL HERNIA REPAIR N/A 02/13/2014   Procedure: LAPAROSCOPIC UMBILICAL HERNIA REPAIR; Surgeon: Shanda ONEIDA Salter, MD; Location: [facility]; Service: General Surgery    Medications Prior to Admission  Medication Sig Dispense Refill Last Dose/Taking   acetaminophen  (TYLENOL ) 650 MG CR tablet Take 1,300 mg by mouth every 8 (eight) hours as needed for pain.   Past Week  albuterol  (VENTOLIN  HFA) 108 (90 Base) MCG/ACT inhaler Inhale 2 puffs into the lungs every 6 (six) hours as needed for wheezing or shortness of breath.   05/09/2024   Ascorbic Acid  (VITAMIN C ) 1000 MG tablet Take 1,000 mg by mouth at bedtime.   05/09/2024   aspirin  EC 81 MG tablet Take 81 mg by mouth daily. Swallow whole.   05/09/2024   busPIRone  (BUSPAR ) 5 MG tablet Take 1 tablet (5 mg total) by mouth at bedtime. 90 tablet 1 05/09/2024   calcitRIOL  (ROCALTROL ) 0.25 MCG capsule Take 0.25 mcg by mouth daily.   05/09/2024   citalopram  (CELEXA ) 40 MG tablet Take 1  tablet (40 mg total) by mouth daily. 90 tablet 3 05/09/2024   ezetimibe  (ZETIA ) 10 MG tablet TAKE 1 TABLET BY MOUTH EVERYDAY AT BEDTIME 90 tablet 1 05/09/2024   febuxostat  (ULORIC ) 40 MG tablet Take 40 mg by mouth at bedtime.   05/09/2024   fexofenadine (ALLEGRA) 180 MG tablet Take 180 mg by mouth daily as needed for allergies or rhinitis.   Past Week   fluorouracil  (EFUDEX ) 5 % cream Apply twice a day to rough areas on  temples, cheeks, forehead, top of scalp/head, crown of scalp until reaction occurs 40 g 2 05/09/2024   Fluticasone -Umeclidin-Vilant (TRELEGY ELLIPTA ) 100-62.5-25 MCG/ACT AEPB Inhale 1 Inhalation into the lungs daily at 6 (six) AM. 60 each 11 05/09/2024   MAGNESIUM  OXIDE 400 PO Take by mouth Nightly.   05/09/2024   metoprolol  succinate (TOPROL -XL) 50 MG 24 hr tablet TAKE 1 TABLET EVERY DAY 90 tablet 3 05/09/2024   Multiple Vitamins-Minerals (PRESERVISION AREDS) CAPS Take 1 capsule by mouth 2 (two) times daily.   05/09/2024   OHTUVAYRE  3 MG/2.5ML SUSP Inhale 3 mg into the lungs.   Unknown   omeprazole  (PRILOSEC) 40 MG capsule Take 1 capsule (40 mg total) by mouth daily. 30 capsule 3 05/09/2024   pantoprazole  (PROTONIX ) 40 MG tablet    05/09/2024   SUPER B COMPLEX/C PO Take 1 tablet by mouth daily.   05/09/2024   torsemide  (DEMADEX ) 20 MG tablet Take 20 mg by mouth daily.   05/09/2024   Alcohol Swabs (DROPSAFE ALCOHOL PREP) 70 % PADS Apply 1 each topically as needed (With repatha and blood sugar checks.).      Evolocumab (REPATHA SURECLICK) 140 MG/ML SOAJ INJECT CONTENT OF 1 CARTRIDGE UNDER THE SKIN EVERY OTHER WEEK 2 mL 29 04/29/2024   metolazone (ZAROXOLYN) 5 MG tablet Take 5 mg by mouth once a week.   05/05/2024   OXYGEN  Inhale 2-3 L into the lungs See admin instructions. Used as needed throughout the day and continuous at bedtime      Social History   Socioeconomic History   Marital status: Married    Spouse name: Urie Loughner   Number of children: 2   Years of education: Not on  file   Highest education level: Not on file  Occupational History   Occupation: retired  Tobacco Use   Smoking status: Former    Current packs/day: 0.00    Average packs/day: 2.0 packs/day for 54.0 years (108.0 ttl pk-yrs)    Types: Cigarettes    Start date: 50    Quit date: 2010    Years since quitting: 16.0   Smokeless tobacco: Former    Types: Chew    Quit date: 05/16/2004  Vaping Use   Vaping status: Never Used  Substance and Sexual Activity   Alcohol use: Yes    Comment: rare beer  Drug use: Never   Sexual activity: Not on file  Other Topics Concern   Not on file  Social History Narrative   Not on file   Social Drivers of Health   Tobacco Use: Medium Risk (04/29/2024)   Patient History    Smoking Tobacco Use: Former    Smokeless Tobacco Use: Former    Passive Exposure: Not on Actuary Strain: Low Risk (08/27/2023)   Overall Financial Resource Strain (CARDIA)    Difficulty of Paying Living Expenses: Not very hard  Recent Concern: Financial Resource Strain - High Risk (07/24/2023)   Received from [facility]   Overall Financial Resource Strain (CARDIA)    Difficulty of Paying Living Expenses: Hard  Food Insecurity: No Food Insecurity (08/28/2023)   Hunger Vital Sign    Worried About Running Out of Food in the Last Year: Never true    Ran Out of Food in the Last Year: Never true  Recent Concern: Food Insecurity - Food Insecurity Present (07/12/2023)   Received from [facility]   Hunger Vital Sign    Worried About Running Out of Food in the Last Year: Sometimes true    Ran Out of Food in the Last Year: Sometimes true  Transportation Needs: No Transportation Needs (08/28/2023)   PRAPARE - Administrator, Civil Service (Medical): No    Lack of Transportation (Non-Medical): No  Physical Activity: Inactive (07/18/2023)   Received from [facility]   Exercise Vital Sign    On average, how many  days per week do you engage in moderate to strenuous exercise (like a brisk walk)?: 0 days    On average, how many minutes do you engage in exercise at this level?: 0 min  Stress: No Stress Concern Present (08/27/2023)   [employer] of Occupational Health - Occupational Stress Questionnaire    Feeling of Stress : Only a little  Recent Concern: Stress - Stress Concern Present (07/18/2023)   Received from [facility] of Occupational Health - Occupational Stress Questionnaire    Feeling of Stress : To some extent  Social Connections: Socially Integrated (08/28/2023)   Social Connection and Isolation Panel    Frequency of Communication with Friends and Family: More than three times a week    Frequency of Social Gatherings with Friends and Family: More than three times a week    Attends Religious Services: More than 4 times per year    Active Member of [employer] or Organizations: Yes    Attends Banker Meetings: Never    Marital Status: Married  Catering Manager Violence: Not At Risk (08/28/2023)   Humiliation, Afraid, Rape, and Kick questionnaire    Fear of Current or Ex-Partner: No    Emotionally Abused: No    Physically Abused: No    Sexually Abused: No  Depression (PHQ2-9): Medium Risk (01/10/2024)   Depression (PHQ2-9)    PHQ-2 Score: 7  Alcohol Screen: Low Risk (03/18/2023)   Alcohol Screen    Last Alcohol Screening Score (AUDIT): 1  Housing: Low Risk (08/28/2023)   Housing Stability Vital Sign    Unable to Pay for Housing in the Last Year: No    Number of Times Moved in the Last Year: 0    Homeless in the Last Year: No  Utilities: Not At Risk (08/28/2023)   AHC Utilities    Threatened with loss of utilities: No  Health Literacy:  Adequate Health Literacy (08/27/2023)   B1300 Health Literacy    Frequency of need for help with medical instructions: Rarely    Family History  Adopted: Yes  Problem Relation Age of Onset   Varicose Veins  Daughter    Obesity Daughter    Hyperlipidemia Daughter    Diabetes Daughter    COPD Daughter    Depression Daughter    Asthma Daughter    Arthritis Son    Diabetes Son    Hypertension Son       Review of systems complete and found to be negative unless listed above      PHYSICAL EXAM  General: Well developed, well nourished, in no acute distress HEENT:  Normocephalic and atramatic Neck:  No JVD.  Lungs: Diminished breath sounds bilaterally to auscultation and percussion. Heart: HRRR . Normal S1 and S2 without gallops or murmurs.  Abdomen: Bowel sounds are positive, abdomen soft and non-tender  Msk:  Back normal, normal gait. Normal strength and tone for age. Extremities: No clubbing, cyanosis or edema.   Neuro: Alert and oriented X 3. Psych:  Good affect, responds appropriately  Labs:   Lab Results  Component Value Date   WBC 6.8 05/11/2024   HGB 10.0 (L) 05/11/2024   HCT 31.5 (L) 05/11/2024   MCV 99.1 05/11/2024   PLT 130 (L) 05/11/2024    Recent Labs  Lab 05/09/24 2229 05/11/24 0403  NA 137 135  K 5.3* 4.1  CL 97* 95*  CO2 24 27  BUN 67* 56*  CREATININE 5.75* 4.69*  CALCIUM 8.4* 8.9  PROT 6.3*  --   BILITOT 0.3  --   ALKPHOS 80  --   ALT 9  --   AST 28  --   GLUCOSE 134* 87   Lab Results  Component Value Date   CKTOTAL 101 07/25/2012   CKMB < 0.5 (L) 07/25/2012   TROPONINI <0.03 07/24/2016    Lab Results  Component Value Date   CHOL 68 (L) 01/16/2024   CHOL 80 (L) 09/20/2023   CHOL 84 (L) 03/06/2023   Lab Results  Component Value Date   HDL 40 01/16/2024   HDL 40 09/20/2023   HDL 33 (L) 03/06/2023   Lab Results  Component Value Date   LDLCALC 14 01/16/2024   LDLCALC 20 09/20/2023   LDLCALC 24 03/06/2023   Lab Results  Component Value Date   TRIG 55 01/16/2024   TRIG 103 09/20/2023   TRIG 164 (H) 03/06/2023   Lab Results  Component Value Date   CHOLHDL 1.7 01/16/2024   CHOLHDL 2.0 09/20/2023   CHOLHDL 2.5 03/06/2023   No  results found for: LDLDIRECT    Radiology: ECHOCARDIOGRAM COMPLETE Result Date: 05/10/2024    ECHOCARDIOGRAM REPORT   Patient Name:   KHOI HAMBERGER Date of Exam: 05/10/2024 Medical Rec #:  [id number]            Height:       65.0 in Accession #:    [id number]           Weight:       211.6 lb Date of Birth:  January 19, 1946           BSA:          2.026 m Patient Age:    78 years             BP:           113/69 mmHg Patient Gender: M  HR:           78 bpm. Exam Location:  [facility] Procedure: 2D Echo, Cardiac Doppler, Color Doppler and Strain Analysis (Both            Spectral and Color Flow Doppler were utilized during procedure). Indications:     CHF-Acute Diastolic I50.31  History:         Patient has prior history of Echocardiogram examinations. CAD.  Sonographer:     Bari Roar Referring Phys:  [id number] DELAYNE LULLA SOLIAN Diagnosing Phys: Cara JONETTA Lovelace MD  Sonographer Comments: Global longitudinal strain was attempted. IMPRESSIONS  1. Left ventricular ejection fraction, by estimation, is 25 to 30%. The left ventricle has severely decreased function. The left ventricle demonstrates global hypokinesis. The left ventricular internal cavity size was moderately to severely dilated. There is mild concentric left ventricular hypertrophy. Left ventricular diastolic parameters are consistent with Grade II diastolic dysfunction (pseudonormalization). The average left ventricular global longitudinal strain is 7.5 %. The global longitudinal strain is abnormal.  2. Right ventricular systolic function is mildly reduced. The right ventricular size is mildly enlarged.  3. Left atrial size was mild to moderately dilated.  4. Right atrial size was moderately dilated.  5. The mitral valve is abnormal. Moderate mitral valve regurgitation.  6. Tricuspid valve regurgitation is severe.  7. The aortic valve is calcified. There is severe calcifcation of the aortic valve. There is moderate thickening of the aortic  valve. Aortic valve regurgitation is mild. Severe aortic valve stenosis. FINDINGS  Left Ventricle: Left ventricular ejection fraction, by estimation, is 25 to 30%. The left ventricle has severely decreased function. The left ventricle demonstrates global hypokinesis. The average left ventricular global longitudinal strain is 7.5 %. Strain was performed and the global longitudinal strain is abnormal. The left ventricular internal cavity size was moderately to severely dilated. There is mild concentric left ventricular hypertrophy. Left ventricular diastolic parameters are consistent  with Grade II diastolic dysfunction (pseudonormalization). Right Ventricle: The right ventricular size is mildly enlarged. No increase in right ventricular wall thickness. Right ventricular systolic function is mildly reduced. Left Atrium: Left atrial size was mild to moderately dilated. Right Atrium: Right atrial size was moderately dilated. Pericardium: The pericardium was not well visualized. Mitral Valve: The mitral valve is abnormal. Moderate mitral valve regurgitation. MV peak gradient, 18.0 mmHg. The mean mitral valve gradient is 8.0 mmHg. Tricuspid Valve: The tricuspid valve is grossly normal. Tricuspid valve regurgitation is severe. Aortic Valve: The aortic valve is calcified. There is severe calcifcation of the aortic valve. There is moderate thickening of the aortic valve. There is moderate to severe aortic valve annular calcification. Aortic valve regurgitation is mild. Severe aortic stenosis is present. Aortic valve mean gradient measures 11.0 mmHg. Aortic valve peak gradient measures 18.7 mmHg. Aortic valve area, by VTI measures 0.59 cm. Pulmonic Valve: The pulmonic valve was not well visualized. Pulmonic valve regurgitation is not visualized. Aorta: The aortic root was not well visualized. IAS/Shunts: No atrial level shunt detected by color flow Doppler. Additional Comments: 3D was performed not requiring image post  processing on an independent workstation and was indeterminate.  LEFT VENTRICLE PLAX 2D LVIDd:         5.60 cm      Diastology LVIDs:         4.90 cm      LV e' medial:    4.33 cm/s LV PW:         1.30 cm  LV E/e' medial:  42.7 LV IVS:        1.20 cm      LV e' lateral:   3.92 cm/s LVOT diam:     1.60 cm      LV E/e' lateral: 47.2 LV SV:         25 LV SV Index:   13           2D Longitudinal Strain LVOT Area:     2.01 cm     2D Strain GLS (A4C):   7.0 %                             2D Strain GLS (A3C):   8.3 %                             2D Strain GLS (A2C):   7.4 % LV Volumes (MOD)            2D Strain GLS Avg:     7.5 % LV vol d, MOD A2C: 156.0 ml LV vol d, MOD A4C: 128.0 ml LV vol s, MOD A2C: 111.0 ml LV vol s, MOD A4C: 91.4 ml LV SV MOD A2C:     45.0 ml LV SV MOD A4C:     128.0 ml LV SV MOD BP:      40.2 ml RIGHT VENTRICLE RV Basal diam:  4.20 cm RV Mid diam:    3.60 cm RV S prime:     8.22 cm/s TAPSE (M-mode): 1.7 cm LEFT ATRIUM              Index        RIGHT ATRIUM           Index LA diam:        4.70 cm  2.32 cm/m   RA Area:     21.50 cm LA Vol (A2C):   101.0 ml 49.85 ml/m  RA Volume:   66.70 ml  32.92 ml/m LA Vol (A4C):   93.6 ml  46.19 ml/m LA Biplane Vol: 98.0 ml  48.36 ml/m  AORTIC VALVE                     PULMONIC VALVE AV Area (Vmax):    0.56 cm      PV Vmax:          1.24 m/s AV Area (Vmean):   0.55 cm      PV Peak grad:     6.2 mmHg AV Area (VTI):     0.59 cm      PR End Diast Vel: 4.93 msec AV Vmax:           216.00 cm/s   RVOT Peak grad:   1 mmHg AV Vmean:          155.000 cm/s AV VTI:            0.428 m AV Peak Grad:      18.7 mmHg AV Mean Grad:      11.0 mmHg LVOT Vmax:         60.00 cm/s LVOT Vmean:        42.200 cm/s LVOT VTI:          0.126 m LVOT/AV VTI ratio: 0.29  AORTA Ao Root diam: 2.90 cm Ao Asc diam:  3.10 cm MITRAL VALVE  TRICUSPID VALVE MV Area (PHT): 3.17 cm     TR Peak grad:   48.7 mmHg MV Area VTI:   0.47 cm     TR Vmax:        349.00 cm/s MV Peak  grad:  18.0 mmHg MV Mean grad:  8.0 mmHg     SHUNTS MV Vmax:       2.12 m/s     Systemic VTI:  0.13 m MV Vmean:      129.0 cm/s   Systemic Diam: 1.60 cm MV Decel Time: 239 msec MR Peak grad: 93.7 mmHg MR Vmax:      484.00 cm/s MV E velocity: 185.00 cm/s MV A velocity: 152.00 cm/s MV E/A ratio:  1.22 MV A Prime:    7.8 cm/s Cara JONETTA Lovelace MD Electronically signed by Cara JONETTA Lovelace MD Signature Date/Time: 05/10/2024/2:29:31 PM    Final    DG Chest Portable 1 View Result Date: 05/09/2024 CLINICAL DATA:  Short of breath EXAM: PORTABLE CHEST 1 VIEW COMPARISON:  12/04/2023, 09/06/2022 FINDINGS: Cardiomegaly with vascular congestion and diffuse interstitial opacities suspected to represent pulmonary edema. Aortic atherosclerosis. No pleural effusion or pneumothorax. IMPRESSION: Cardiomegaly with vascular congestion and diffuse interstitial opacities suspected to represent pulmonary edema. Electronically Signed   By: Luke Bun M.D.   On: 05/09/2024 20:59   VAS US  DUPLEX DIALYSIS ACCESS (AVF, AVG) Result Date: 04/25/2024 DIALYSIS ACCESS Patient Name:  ADONYS WILDES  Date of Exam:   04/23/2024 Medical Rec #: [id number]                Accession #:    [id number] Date of Birth: [birth year]               Patient Gender: M Patient Age:   33 years Exam Location:  [facility] Procedure:      VAS US  DUPLEX DIALYSIS ACCESS (AVF, AVG) Referring Phys: CORDELLA SHAWL --------------------------------------------------------------------------------  Reason for Exam: Routine follow up. Access Site: Right Upper Extremity. Access Type: Brachial-cephalic AVF. History: 06/04/2023: Right Brachial Cephalic AVF created. Comparison Study: 12/2023 Performing Technologist: Jerel Croak RVT  Examination Guidelines: A complete evaluation includes B-mode imaging, spectral Doppler, color Doppler, and power Doppler as needed of all accessible portions of each vessel. Unilateral testing is considered an integral part of a  complete examination. Limited examinations for reoccurring indications may be performed as noted.  Findings: +--------------------+----------+-----------------+--------+ AVF                 PSV (cm/s)Flow Vol (mL/min)Comments +--------------------+----------+-----------------+--------+ Native artery inflow   173          1832                +--------------------+----------+-----------------+--------+ AVF Anastomosis        268                              +--------------------+----------+-----------------+--------+  +-------------+----------+-------------+----------+------------------+ OUTFLOW VEIN PSV (cm/s)Diameter (cm)Depth (cm)     Describe      +-------------+----------+-------------+----------+------------------+ Axillary vein   158                                              +-------------+----------+-------------+----------+------------------+ Confluence      144                                              +-------------+----------+-------------+----------+------------------+  Prox UA         129                                              +-------------+----------+-------------+----------+------------------+ Mid UA          620        0.32               change in Diameter +-------------+----------+-------------+----------+------------------+ Dist UA         252        0.85                                  +-------------+----------+-------------+----------+------------------+  +-------------+-------------+----------+---------+---------+-------------------+              Diameter (cm)Depth (cm)Branching   PSV       Flow Volume                                                   (cm/s)       (ml/min)       +-------------+-------------+----------+---------+---------+-------------------+ Rt Rad Art                                      43                        Dis                                                                        +-------------+-------------+----------+---------+---------+-------------------+  Summary: Patent AVF with moderate stenosis in mid UA AVF.  *See table(s) above for measurements and observations.  Diagnosing physician: Cordella Shawl MD Electronically signed by Cordella Shawl MD on 04/25/2024 at 12:24:56 PM.   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 Plan Agree with admit to telemetry follow-up troponins EKGs monitor and BMP Agree with respiratory support inhalers diuretics antibiotics Continue dialysis therapy Hypertension possibly secondary to Lasix  nitrates recommend conservative management Acute respiratory tract infection consider steroid inhalers antibiotics cough suppressant  Obstructive sleep apnea recommend sleep study CPAP weight loss Continue inhalers for acute exacerbation DuoNebs steroids antibiotics History of severe aortic stenosis status post TAVR continue conservative management Echocardiogram for assessment of left ventricular function and valvular structure IV diuretics if patient able to respond recommend more aggressive dialysis to reduce congestion   Signed: Cara JONETTA Lovelace MD, 05/11/2024, 11:47 AM      "

## 2024-05-12 DIAGNOSIS — I5043 Acute on chronic combined systolic (congestive) and diastolic (congestive) heart failure: Secondary | ICD-10-CM | POA: Diagnosis not present

## 2024-05-12 LAB — RENAL FUNCTION PANEL
Albumin: 3.3 g/dL — ABNORMAL LOW (ref 3.5–5.0)
Anion gap: 15 (ref 5–15)
BUN: 65 mg/dL — ABNORMAL HIGH (ref 8–23)
CO2: 25 mmol/L (ref 22–32)
Calcium: 9.2 mg/dL (ref 8.9–10.3)
Chloride: 96 mmol/L — ABNORMAL LOW (ref 98–111)
Creatinine, Ser: 4.9 mg/dL — ABNORMAL HIGH (ref 0.61–1.24)
GFR, Estimated: 11 mL/min — ABNORMAL LOW
Glucose, Bld: 71 mg/dL (ref 70–99)
Phosphorus: 6.5 mg/dL — ABNORMAL HIGH (ref 2.5–4.6)
Potassium: 3.8 mmol/L (ref 3.5–5.1)
Sodium: 136 mmol/L (ref 135–145)

## 2024-05-12 LAB — GLUCOSE, CAPILLARY
Glucose-Capillary: 81 mg/dL (ref 70–99)
Glucose-Capillary: 134 mg/dL — ABNORMAL HIGH (ref 70–99)

## 2024-05-12 LAB — CBC
HCT: 27.7 % — ABNORMAL LOW (ref 39.0–52.0)
Hemoglobin: 9.2 g/dL — ABNORMAL LOW (ref 13.0–17.0)
MCH: 32.3 pg (ref 26.0–34.0)
MCHC: 33.2 g/dL (ref 30.0–36.0)
MCV: 97.2 fL (ref 80.0–100.0)
Platelets: 137 K/uL — ABNORMAL LOW (ref 150–400)
RBC: 2.85 MIL/uL — ABNORMAL LOW (ref 4.22–5.81)
RDW: 15.2 % (ref 11.5–15.5)
WBC: 5.2 K/uL (ref 4.0–10.5)
nRBC: 0 % (ref 0.0–0.2)

## 2024-05-12 MED ORDER — ALTEPLASE 2 MG IJ SOLR: 2.0000 mg | Freq: Once | INTRAMUSCULAR | Status: DC | PRN

## 2024-05-12 MED ORDER — PENTAFLUOROPROP-TETRAFLUOROETH EX AERO
1.0000 | INHALATION_SPRAY | CUTANEOUS | Status: DC | PRN
  Filled 2024-05-12: qty 30

## 2024-05-12 MED ORDER — ANTICOAGULANT SODIUM CITRATE 4% (200MG/5ML) IV SOLN: 5.0000 mL | Status: DC | PRN

## 2024-05-12 MED ORDER — LIDOCAINE-PRILOCAINE 2.5-2.5 % EX CREA
1.0000 | TOPICAL_CREAM | CUTANEOUS | Status: DC | PRN
  Filled 2024-05-12: qty 5

## 2024-05-12 MED ORDER — METOPROLOL SUCCINATE ER 25 MG PO TB24
12.5000 mg | ORAL_TABLET | Freq: Every day | ORAL | Status: DC
  Administered 2024-05-12 – 2024-05-15 (×3): 12.5 mg via ORAL
  Filled 2024-05-12 (×3): qty 1

## 2024-05-12 MED ORDER — LIDOCAINE HCL (PF) 1 % IJ SOLN
5.0000 mL | INTRAMUSCULAR | Status: DC | PRN
  Filled 2024-05-12: qty 5

## 2024-05-12 MED ORDER — HEPARIN SODIUM (PORCINE) 1000 UNIT/ML DIALYSIS
1000.0000 [IU] | INTRAMUSCULAR | Status: DC | PRN
  Filled 2024-05-12: qty 1

## 2024-05-12 NOTE — Progress Notes (Signed)
 [facility] Progress Note  Patient: [name] [name]    FMW:[id number]  DOA: 05/09/2024     Date of Service: the patient was seen and examined on 05/12/2024  Chief Complaint  Patient presents with   Respiratory Distress   Brief hospital course: [name] [name] is a 79 y.o. 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  History provided by wife at bedside.  Patient 's last session was Monday Jan 12. He missed his Friday dialysis session due to feeling unwell with fever cough and malaise.  Later in the day he went into acute respiratory distress.  EMS brought him in on CPAP. In the ED, he was febrile to 100.8 heart rate 96 and tachypneic to the high 20s with O2 sat of 100% on CPAP.  BP was initially normal at 115/67, trending down to as low as 91/44 during workup in the ED. VBG on BiPAP with pH 7.25 and pCO2 58. Labs otherwise notable for WBC 10.7 with lactic acid 1.8 and negative respiratory viral panel. Hemoglobin at baseline at 10.8. CMP with results in keeping with dialysis status.  Mild hyperkalemia 5.3 Troponin 68 and proBNP> 35,000 Chest x-ray consistent with pulmonary edema.   The ED provider spoke with nephrologist, Dr. Dominica, who will be making arrangements for urgent dialysis or dialysis first thing in the AM.   Patient was started on cefepime  and doxycycline  for possible pneumonia   Admission requested    Assessment and Plan:   # Acute on chronic combined systolic and diastolic CHF (congestive heart failure) # Severe aortic valve stenosis, severe  MR, moderate TR # Acute respiratory failure with hypoxia # Volume overload secondary to missed dialysis # ESRD on hemodialysis M&F Patient was hypotensive that is why Lasix  was not given on admission  Nephrology was consulted, patient got hemodialysis done. Continue BiPAP as needed Continue supplemental O2 inhalation and gradually wean down Patient was on metolazone and metoprolol  at home TTE LVEF 25 to 30%, grade 2 DD, severe TR, moderate MR, severe AAS 1/17 started Lasix  40 mg IV twice daily Nephrology following, patient received hemodialysis early morning today, will be hemodialysis per nephrology. 1/18 cardiology consulted for further recommendation due to worsening of CHF and significant valvular abnormality. Cardiology recommending beta-blocker, cannot tolerate GDMT due to ESRD 1/19 started Toprol  XL 12.5 mg p.o. daily  # Hypotension BP as low as 90s over 40s in the ED in the setting of acute respiratory tract infection Possibly related to sepsis, versus home antihypertensives Treating SIRS/sepsis as outlined on the specific problem May need pressors if unable to maintain MAP over 65 Close hemodynamic monitoring Addendum: Improving on reassessment during dialysis.  Albumin  to be administered if needed 1/17 started midodrine  5 mg p.o. 3 times daily as needed if SBP less than 100 mmHg   # Acute respiratory tract infection, possibly lower SIRS, possible sepsis SIRS criteria include fever, tachycardia, tachypnea with leukocytosis Patient presented with cough, fever and malaise, negative respiratory viral panel Continue empiric treatment with cefepime  and doxycycline  for possible lower respiratory tract infection Antitussives Supplemental oxygen    ESRD on hemodialysis  Anemia of  ESRD Patient only dialyzes twice a week on Mondays and Fridays Nephrology arranging urgent dialysis 1/18 as per patient's wife patient is scheduled for AV fistulogram by vascular surgery on 1/20 1/19  vascular surgery consulted, patient will be seen tomorrow for AV fistulogram   Severe aortic stenosis S/P TAVR (transcatheter aortic valve replacement) No acute issues suspected at this time   OSA on CPAP CPAP nightly once weaned off BiPAP   COPD (chronic obstructive pulmonary disease) (HCC) Acute exacerbation not suspected at this time Continue home inhalers DuoNebs as needed   Coronary artery disease Continue home aspirin  and ezetimibe . Holding metoprolol  due to low blood pressure   Anxiety and depression Continue buspirone  and citalopram      Body mass index is 30.52 kg/m.  Interventions:  Diet: Heart healthy diet DVT Prophylaxis: Subcutaneous Heparin     Advance goals of care discussion: DNR-limited  Family Communication: family was present at bedside, at the time of interview.  The pt provided permission to discuss medical plan with the family. Opportunity was given to ask question and all questions were answered satisfactorily.  1/19 d/w family at bedside  Disposition:  Pt is from Home , admitted with volume overload, ESRD missed hemodialysis, still has shortness of breath, which precludes a safe discharge. Discharge to home, when stable, most likely in few days.  Subjective: No significant event overnight.  Patient was seen after hemodialysis, feels improvement in the shortness of breath.  Patient agreed for hemodialysis on MWF schedule.  Denied any other complaints. Patient agreed with the DNR/DNI.   Physical Exam: General: NAD, lying comfortably Appear in no distress, affect appropriate Eyes: PERRLA ENT: Oral Mucosa Clear, moist  Neck: no JVD,  Cardiovascular: S1 and S2 Present, audible murmur,  Respiratory: good air entry bilaterally, bilateral crackles, no significant wheezes Abdomen: Bowel Sound present, Soft and no tenderness,  Skin: no rashes Extremities: 3-4+ b/l Pedal edema Neurologic: without any new focal findings Gait not checked due to patient  safety concerns  Vitals:   05/12/24 1030 05/12/24 1034 05/12/24 1045 05/12/24 1127  BP: (!) 124/59 (!) 124/59 124/65 124/64  Pulse: 96 92 90 97  Resp: (!) 21 (!) 24 (!) 25 20  Temp:   97.6 F (36.4 C) 98.1 F (36.7 C)  TempSrc:   Oral   SpO2: (!) 87% 92% 94% 91%  Weight:   83.2 kg   Height:        Intake/Output Summary (Last 24 hours) at 05/12/2024 1508 Last data filed at 05/12/2024 1045 Gross per 24 hour  Intake --  Output 2000 ml  Net -2000 ml   Filed Weights   05/12/24 0500 05/12/24 0744 05/12/24 1045  Weight: 83.6 kg 85.2 kg 83.2 kg    Data Reviewed: I have personally reviewed and interpreted daily labs, tele strips, imagings as discussed above. I reviewed all nursing notes, pharmacy notes, vitals, pertinent old records I have discussed plan of care as described above with RN and patient/family.  CBC: Recent Labs  Lab 05/09/24 2033 05/11/24 0403 05/12/24 1050  WBC 10.7* 6.8 5.2  NEUTROABS 6.9  --   --   HGB 10.8* 10.0* 9.2*  HCT 35.1* 31.5* 27.7*  MCV 104.2* 99.1 97.2  PLT 167 130* 137*   Basic Metabolic Panel: Recent Labs  Lab 05/09/24 2229 05/11/24 0403 05/12/24 1050  NA 137 135 136  K 5.3* 4.1 3.8  CL 97* 95* 96*  CO2 24 27 25   GLUCOSE 134* 87 71  BUN 67* 56* 65*  CREATININE 5.75* 4.69* 4.90*  CALCIUM 8.4* 8.9 9.2  MG  --  2.2  --   PHOS  --  6.6* 6.5*    Studies: No results found.   Scheduled Meds:  aspirin  EC  81 mg Oral Daily   busPIRone   5 mg Oral QHS   calcitRIOL   0.25 mcg Oral Daily   Chlorhexidine  Gluconate Cloth  6 each Topical Q0600   citalopram   40 mg Oral Daily   doxycycline   100 mg Oral Q12H   ezetimibe   10 mg Oral Daily   furosemide   40 mg Intravenous BID   guaiFENesin   600 mg Oral BID   heparin   5,000 Units Subcutaneous Q8H   pantoprazole   40 mg Oral Daily   Continuous Infusions:  albumin  human Stopped (05/10/24 0435)   ceFEPime  (MAXIPIME ) IV 1 g (05/11/24 2033)   PRN Meds: acetaminophen  **OR** acetaminophen ,  ipratropium-albuterol , midodrine , ondansetron  **OR** ondansetron  (ZOFRAN ) IV, oxyCODONE   Time spent: 55 minutes  Author: ELVAN SOR. MD [facility] 05/12/2024 3:08 PM  To reach On-call, see care teams to locate the attending and reach out to them via [email or web address]. If 7PM-7AM, please contact night-coverage If you still have difficulty reaching the attending provider, please page the [facility] (Director on Call) for [facility] on amion for assistance.

## 2024-05-12 NOTE — Progress Notes (Addendum)
 " [facility]  PROGRESS NOTE   Subjective:   Patient seen and evaluated during dialysis   HEMODIALYSIS FLOWSHEET:  Blood Flow Rate (mL/min): 349 mL/min Arterial Pressure (mmHg): -181.81 mmHg Venous Pressure (mmHg): 145.25 mmHg TMP (mmHg): 3.23 mmHg Ultrafiltration Rate (mL/min): 0 mL/min Dialysate Flow Rate (mL/min): 300 ml/min Dialysis Fluid Bolus: Normal Saline Bolus Amount (mL): 100 mL  Productive cough present  Objective:  Vital signs: Blood pressure 124/65, pulse 90, temperature 97.6 F (36.4 C), temperature source Oral, resp. rate (!) 25, height 5' 5 (1.651 m), weight 83.2 kg, SpO2 94%.  Intake/Output Summary (Last 24 hours) at 05/12/2024 1058 Last data filed at 05/12/2024 1045 Gross per 24 hour  Intake --  Output 2000 ml  Net -2000 ml   Filed Weights   05/12/24 0500 05/12/24 0744 05/12/24 1045  Weight: 83.6 kg 85.2 kg 83.2 kg     Physical Exam: General:  No acute distress  Head:  Normocephalic, atraumatic. Moist oral mucosal membranes  Eyes:  Anicteric  Neck:  Supple  Lungs:   rhonchi, normal effort, cough  Heart:  S1S2 no rubs  Abdomen:   Soft, nontender, bowel sounds present  Extremities:  Trace peripheral edema.  Neurologic:  Awake, alert, following commands  Skin:  No lesions  Access: Rt AVF    Basic Metabolic Panel: Recent Labs  Lab 05/09/24 2229 05/11/24 0403  NA 137 135  K 5.3* 4.1  CL 97* 95*  CO2 24 27  GLUCOSE 134* 87  BUN 67* 56*  CREATININE 5.75* 4.69*  CALCIUM 8.4* 8.9  MG  --  2.2  PHOS  --  6.6*   GFR: Estimated Creatinine Clearance: 12.5 mL/min (A) (by C-G formula based on SCr of 4.69 mg/dL (H)).  Liver Function Tests: Recent Labs  Lab 05/09/24 2229  AST 28  ALT 9  ALKPHOS 80  BILITOT 0.3  PROT 6.3*  ALBUMIN  3.2*   No results for input(s): LIPASE, AMYLASE in the last 168 hours. No results for input(s): AMMONIA in the last 168 hours.  CBC: Recent Labs  Lab 05/09/24 2033 05/11/24 0403  WBC  10.7* 6.8  NEUTROABS 6.9  --   HGB 10.8* 10.0*  HCT 35.1* 31.5*  MCV 104.2* 99.1  PLT 167 130*     HbA1C: Hemoglobin A1C  Date/Time Value Ref Range Status  02/22/2022 12:00 AM 5.0  Final  07/26/2012 01:11 AM 6.2 4.2 - 6.3 % Final    Comment:    The American Diabetes Association recommends that a primary goal of therapy should be <7% and that physicians should reevaluate the treatment regimen in patients with HbA1c values consistently >8%.    Hgb A1c MFr Bld  Date/Time Value Ref Range Status  01/16/2024 10:20 AM 5.1 4.8 - 5.6 % Final    Comment:             Prediabetes: 5.7 - 6.4          Diabetes: >6.4          Glycemic control for adults with diabetes: <7.0   09/20/2023 10:03 AM 5.0 4.8 - 5.6 % Final    Comment:             Prediabetes: 5.7 - 6.4          Diabetes: >6.4          Glycemic control for adults with diabetes: <7.0     Urinalysis: No results for input(s): COLORURINE, LABSPEC, PHURINE, GLUCOSEU, HGBUR, BILIRUBINUR, KETONESUR, PROTEINUR, UROBILINOGEN,  NITRITE, LEUKOCYTESUR in the last 72 hours.  Invalid input(s): APPERANCEUR    Imaging: ECHOCARDIOGRAM COMPLETE Result Date: 05/10/2024    ECHOCARDIOGRAM REPORT   Patient Name:   SKYLIER KRETSCHMER Date of Exam: 05/10/2024 Medical Rec #:  [id number]            Height:       65.0 in Accession #:    [id number]           Weight:       211.6 lb Date of Birth:  July 09, 1945           BSA:          2.026 m Patient Age:    78 years             BP:           113/69 mmHg Patient Gender: M                    HR:           78 bpm. Exam Location:  [facility] Procedure: 2D Echo, Cardiac Doppler, Color Doppler and Strain Analysis (Both            Spectral and Color Flow Doppler were utilized during procedure). Indications:     CHF-Acute Diastolic I50.31  History:         Patient has prior history of Echocardiogram examinations. CAD.  Sonographer:     Bari Roar Referring Phys:  [id number] DELAYNE LULLA SOLIAN Diagnosing  Phys: Cara JONETTA Lovelace MD  Sonographer Comments: Global longitudinal strain was attempted. IMPRESSIONS  1. Left ventricular ejection fraction, by estimation, is 25 to 30%. The left ventricle has severely decreased function. The left ventricle demonstrates global hypokinesis. The left ventricular internal cavity size was moderately to severely dilated. There is mild concentric left ventricular hypertrophy. Left ventricular diastolic parameters are consistent with Grade II diastolic dysfunction (pseudonormalization). The average left ventricular global longitudinal strain is 7.5 %. The global longitudinal strain is abnormal.  2. Right ventricular systolic function is mildly reduced. The right ventricular size is mildly enlarged.  3. Left atrial size was mild to moderately dilated.  4. Right atrial size was moderately dilated.  5. The mitral valve is abnormal. Moderate mitral valve regurgitation.  6. Tricuspid valve regurgitation is severe.  7. The aortic valve is calcified. There is severe calcifcation of the aortic valve. There is moderate thickening of the aortic valve. Aortic valve regurgitation is mild. Severe aortic valve stenosis. FINDINGS  Left Ventricle: Left ventricular ejection fraction, by estimation, is 25 to 30%. The left ventricle has severely decreased function. The left ventricle demonstrates global hypokinesis. The average left ventricular global longitudinal strain is 7.5 %. Strain was performed and the global longitudinal strain is abnormal. The left ventricular internal cavity size was moderately to severely dilated. There is mild concentric left ventricular hypertrophy. Left ventricular diastolic parameters are consistent  with Grade II diastolic dysfunction (pseudonormalization). Right Ventricle: The right ventricular size is mildly enlarged. No increase in right ventricular wall thickness. Right ventricular systolic function is mildly reduced. Left Atrium: Left atrial size was mild to  moderately dilated. Right Atrium: Right atrial size was moderately dilated. Pericardium: The pericardium was not well visualized. Mitral Valve: The mitral valve is abnormal. Moderate mitral valve regurgitation. MV peak gradient, 18.0 mmHg. The mean mitral valve gradient is 8.0 mmHg. Tricuspid Valve: The tricuspid valve is grossly normal. Tricuspid valve regurgitation is severe. Aortic Valve: The aortic valve is calcified.  There is severe calcifcation of the aortic valve. There is moderate thickening of the aortic valve. There is moderate to severe aortic valve annular calcification. Aortic valve regurgitation is mild. Severe aortic stenosis is present. Aortic valve mean gradient measures 11.0 mmHg. Aortic valve peak gradient measures 18.7 mmHg. Aortic valve area, by VTI measures 0.59 cm. Pulmonic Valve: The pulmonic valve was not well visualized. Pulmonic valve regurgitation is not visualized. Aorta: The aortic root was not well visualized. IAS/Shunts: No atrial level shunt detected by color flow Doppler. Additional Comments: 3D was performed not requiring image post processing on an independent workstation and was indeterminate.  LEFT VENTRICLE PLAX 2D LVIDd:         5.60 cm      Diastology LVIDs:         4.90 cm      LV e' medial:    4.33 cm/s LV PW:         1.30 cm      LV E/e' medial:  42.7 LV IVS:        1.20 cm      LV e' lateral:   3.92 cm/s LVOT diam:     1.60 cm      LV E/e' lateral: 47.2 LV SV:         25 LV SV Index:   13           2D Longitudinal Strain LVOT Area:     2.01 cm     2D Strain GLS (A4C):   7.0 %                             2D Strain GLS (A3C):   8.3 %                             2D Strain GLS (A2C):   7.4 % LV Volumes (MOD)            2D Strain GLS Avg:     7.5 % LV vol d, MOD A2C: 156.0 ml LV vol d, MOD A4C: 128.0 ml LV vol s, MOD A2C: 111.0 ml LV vol s, MOD A4C: 91.4 ml LV SV MOD A2C:     45.0 ml LV SV MOD A4C:     128.0 ml LV SV MOD BP:      40.2 ml RIGHT VENTRICLE RV Basal diam:  4.20  cm RV Mid diam:    3.60 cm RV S prime:     8.22 cm/s TAPSE (M-mode): 1.7 cm LEFT ATRIUM              Index        RIGHT ATRIUM           Index LA diam:        4.70 cm  2.32 cm/m   RA Area:     21.50 cm LA Vol (A2C):   101.0 ml 49.85 ml/m  RA Volume:   66.70 ml  32.92 ml/m LA Vol (A4C):   93.6 ml  46.19 ml/m LA Biplane Vol: 98.0 ml  48.36 ml/m  AORTIC VALVE                     PULMONIC VALVE AV Area (Vmax):    0.56 cm      PV Vmax:          1.24 m/s AV Area (  Vmean):   0.55 cm      PV Peak grad:     6.2 mmHg AV Area (VTI):     0.59 cm      PR End Diast Vel: 4.93 msec AV Vmax:           216.00 cm/s   RVOT Peak grad:   1 mmHg AV Vmean:          155.000 cm/s AV VTI:            0.428 m AV Peak Grad:      18.7 mmHg AV Mean Grad:      11.0 mmHg LVOT Vmax:         60.00 cm/s LVOT Vmean:        42.200 cm/s LVOT VTI:          0.126 m LVOT/AV VTI ratio: 0.29  AORTA Ao Root diam: 2.90 cm Ao Asc diam:  3.10 cm MITRAL VALVE                TRICUSPID VALVE MV Area (PHT): 3.17 cm     TR Peak grad:   48.7 mmHg MV Area VTI:   0.47 cm     TR Vmax:        349.00 cm/s MV Peak grad:  18.0 mmHg MV Mean grad:  8.0 mmHg     SHUNTS MV Vmax:       2.12 m/s     Systemic VTI:  0.13 m MV Vmean:      129.0 cm/s   Systemic Diam: 1.60 cm MV Decel Time: 239 msec MR Peak grad: 93.7 mmHg MR Vmax:      484.00 cm/s MV E velocity: 185.00 cm/s MV A velocity: 152.00 cm/s MV E/A ratio:  1.22 MV A Prime:    7.8 cm/s Cara JONETTA Lovelace MD Electronically signed by Cara JONETTA Lovelace MD Signature Date/Time: 05/10/2024/2:29:31 PM    Final      Medications:    albumin  human Stopped (05/10/24 0435)   anticoagulant sodium citrate      ceFEPime  (MAXIPIME ) IV 1 g (05/11/24 2033)    aspirin  EC  81 mg Oral Daily   busPIRone   5 mg Oral QHS   calcitRIOL   0.25 mcg Oral Daily   Chlorhexidine  Gluconate Cloth  6 each Topical Q0600   citalopram   40 mg Oral Daily   doxycycline   100 mg Oral Q12H   ezetimibe   10 mg Oral Daily   furosemide   40 mg Intravenous  BID   guaiFENesin   600 mg Oral BID   heparin   5,000 Units Subcutaneous Q8H   pantoprazole   40 mg Oral Daily    Assessment/ Plan:     79 year old male with history of ESRD on hemodialysis Monday Wednesday Friday schedule, COPD on home oxygen , obstructive sleep apnea, diabetes, hypertension, history of TAVR now admitted with history of acute respiratory failure and was initially placed on BiPAP.  Patient was dialyzed last night.   #1: ESRD: Receiving dialysis today, UF 2-2.5 L as tolerated. Due to volume status, patient agreeable to transition to three treatments a week. Next treatment scheduled for Wednesday   #2: Anemia with chronic kidney disease:  Lab Results  Component Value Date   HGB 10.0 (L) 05/11/2024   Hgb within optimal range.   #3: Respiratory failure/pneumonia: Remains on cefepime  and doxycycline .   #4: CHF: ECHO completed on 05/10/24 shows EF 25-30% with mild LVH and Grade 2 dd. Continue furosemide  twice daily.  Patient need  to be on fluid restriction.   #5: Secondary hyperparathyroidism: Outpatient labs: PTH 261, phosphorus 6.1, and calcium 9.2 on 05/05/24.   Phos remains elevated,. 6.6. Continue calcitriol  and may consider binders.    LOS: 2 [facility] 1/19/202610:58 AM  "

## 2024-05-12 NOTE — Progress Notes (Signed)
 Heart Failure Navigator Progress Note  Assessed for Heart & Vascular TOC clinic readiness.  Patient does not meet criteria due to current [facility] Cardiology consult of Dr. Kathern patient. Also ESRD on HD.    Navigator will sign off at this time.  Charmaine Pines, RN, BSN [facility] Heart Failure Navigator Secure Chat Only

## 2024-05-12 NOTE — Plan of Care (Signed)

## 2024-05-12 NOTE — Procedures (Signed)
 Post Hemodialysis Report:   Treatment Date:05/12/24  Treatment Length: 2.5hours  Patient Access: Right AVF  Experienced cramping during treatment, corrected with uf goal break and saline bolus.Shantelle Breeze,NP made aware. Net 2 liters removed.  Access functioned well to prescribed BFR. No medications with treatment. Consent signed and in chart.  Post Vitals:  Temp:97.6 Temp Source: oral Heart Rate: 82 Resp: 22 B/P: 124/65(82) O2: 97% on 5liters nasal cannula.

## 2024-05-12 NOTE — Progress Notes (Signed)
 [facility] CLINIC CARDIOLOGY PROGRESS NOTE   Patient ID: [name] [name] MRN: [id number] DOB/AGE: 1945/07/12 79 y.o.  Admit date: 05/09/2024 Referring Physician Dr. Elvan Sor Primary Physician Orlean Alan HERO, FNP  Primary Cardiologist Dr. Denyse Bathe Reason for Consultation SOB, respiratory distress  HPI: [name] [name] is a 79 y.o. 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 Cardiology was consulted for further evaluation.   Interval History:  -Patient seen and examined this AM during dialysis, feeling ok.  -States SOB is improving. Still with significant LE edema.  -BP and HR stable with no events on tele.  Review of systems complete and found to be negative unless listed above   Vitals:   05/12/24 0744 05/12/24 0800 05/12/24 0812 05/12/24 0830  BP: 130/64 (!) 126/59  133/63  Pulse: 93 91 85 92  Resp: (!) 21 (!) 21 (!) 22 19  Temp: 98.4 F (36.9 C)     TempSrc: Oral     SpO2: 94% 98% 92% 93%  Weight: 85.2 kg     Height:         Intake/Output Summary (Last 24 hours) at 05/12/2024 0934 Last data filed at 05/11/2024 1037 Gross per 24 hour  Intake 120 ml  Output 1 ml  Net 119 ml     PHYSICAL EXAM General: Chronically ill appearing male, well nourished, in no acute distress. HEENT: Normocephalic and atraumatic. Neck: No JVD.  Lungs: Normal respiratory effort on 5L [place]. Clear bilaterally to auscultation. No wheezes, crackles, rhonchi.  Heart: HRRR. Normal S1 and S2 without gallops or murmurs. Radial & DP pulses 2+ bilaterally. Abdomen: Non-distended appearing.  Msk: Normal strength and tone for age. Extremities: No clubbing, cyanosis or edema.    Neuro: Alert and oriented X 3. Psych: Mood appropriate, affect congruent.    LABS: Basic Metabolic Panel: Recent Labs    05/09/24 2229 05/11/24 0403  NA 137 135  K 5.3* 4.1  CL 97* 95*  CO2 24 27  GLUCOSE 134* 87  BUN 67* 56*  CREATININE 5.75* 4.69*  CALCIUM 8.4* 8.9  MG  --  2.2  PHOS  --  6.6*   Liver Function Tests: Recent Labs    05/09/24 2229  AST 28  ALT 9  ALKPHOS 80  BILITOT 0.3  PROT 6.3*  ALBUMIN  3.2*   No results for input(s): LIPASE, AMYLASE in the last 72 hours. CBC: Recent Labs    05/09/24 2033 05/11/24 0403  WBC 10.7* 6.8  NEUTROABS 6.9  --   HGB 10.8* 10.0*  HCT 35.1* 31.5*  MCV 104.2* 99.1  PLT 167 130*   Cardiac Enzymes: No results for input(s): CKTOTAL, CKMB, CKMBINDEX, TROPONINIHS in the last 72 hours. BNP: No results for input(s): BNP in the last 72 hours. D-Dimer: No results for input(s): DDIMER in the last 72 hours. Hemoglobin A1C: No results for input(s): HGBA1C in the last 72 hours. Fasting Lipid Panel: No results for input(s): CHOL, HDL, LDLCALC, TRIG, CHOLHDL, LDLDIRECT in the last 72 hours. Thyroid  Function Tests: No results for input(s): TSH, T4TOTAL, T3FREE, THYROIDAB in the last 72 hours.  Invalid input(s): FREET3 Anemia Panel: No results for input(s): VITAMINB12, FOLATE, FERRITIN, TIBC, IRON, RETICCTPCT in  the last 72 hours.  ECHOCARDIOGRAM COMPLETE Result Date: 05/10/2024    ECHOCARDIOGRAM REPORT   Patient Name:   [name] [name] Date of Exam: 05/10/2024 Medical Rec #:  [id number]            Height:       65.0 in Accession #:    [id number]           Weight:       211.6 lb Date of Birth:  1946-01-01           BSA:          2.026 m Patient Age:    78 years             BP:           113/69 mmHg Patient Gender: M                    HR:           78 bpm. Exam Location:  [facility] Procedure: 2D Echo, Cardiac Doppler, Color Doppler and Strain Analysis (Both            Spectral  and Color Flow Doppler were utilized during procedure). Indications:     CHF-Acute Diastolic I50.31  History:         Patient has prior history of Echocardiogram examinations. CAD.  Sonographer:     Bari Roar Referring Phys:  [id number] DELAYNE LULLA SOLIAN Diagnosing Phys: Cara JONETTA Lovelace MD  Sonographer Comments: Global longitudinal strain was attempted. IMPRESSIONS  1. Left ventricular ejection fraction, by estimation, is 25 to 30%. The left ventricle has severely decreased function. The left ventricle demonstrates global hypokinesis. The left ventricular internal cavity size was moderately to severely dilated. There is mild concentric left ventricular hypertrophy. Left ventricular diastolic parameters are consistent with Grade II diastolic dysfunction (pseudonormalization). The average left ventricular global longitudinal strain is 7.5 %. The global longitudinal strain is abnormal.  2. Right ventricular systolic function is mildly reduced. The right ventricular size is mildly enlarged.  3. Left atrial size was mild to moderately dilated.  4. Right atrial size was moderately dilated.  5. The mitral valve is abnormal. Moderate mitral valve regurgitation.  6. Tricuspid valve regurgitation is severe.  7. The aortic valve is calcified. There is severe calcifcation of the aortic valve. There is moderate thickening of the aortic valve. Aortic valve regurgitation is mild. Severe aortic valve stenosis. FINDINGS  Left Ventricle: Left ventricular ejection fraction, by estimation, is 25 to 30%. The left ventricle has severely decreased function. The left ventricle demonstrates global hypokinesis. The average left ventricular global longitudinal strain is 7.5 %. Strain was performed and the global longitudinal strain is abnormal. The left ventricular internal cavity size was moderately to severely dilated. There is mild concentric left ventricular hypertrophy. Left ventricular diastolic parameters are consistent  with Grade II  diastolic dysfunction (pseudonormalization). Right Ventricle: The right ventricular size is mildly enlarged. No increase in right ventricular wall thickness. Right ventricular systolic function is mildly reduced. Left Atrium: Left atrial size was mild to moderately dilated. Right Atrium: Right atrial size was moderately dilated. Pericardium: The pericardium was not well visualized. Mitral Valve: The mitral valve is abnormal. Moderate mitral valve regurgitation. MV peak gradient, 18.0 mmHg. The mean mitral valve gradient is 8.0 mmHg. Tricuspid Valve: The tricuspid valve is grossly normal. Tricuspid valve regurgitation is severe. Aortic Valve: The aortic valve is calcified. There is severe calcifcation of the aortic valve. There is  moderate thickening of the aortic valve. There is moderate to severe aortic valve annular calcification. Aortic valve regurgitation is mild. Severe aortic stenosis is present. Aortic valve mean gradient measures 11.0 mmHg. Aortic valve peak gradient measures 18.7 mmHg. Aortic valve area, by VTI measures 0.59 cm. Pulmonic Valve: The pulmonic valve was not well visualized. Pulmonic valve regurgitation is not visualized. Aorta: The aortic root was not well visualized. IAS/Shunts: No atrial level shunt detected by color flow Doppler. Additional Comments: 3D was performed not requiring image post processing on an independent workstation and was indeterminate.  LEFT VENTRICLE PLAX 2D LVIDd:         5.60 cm      Diastology LVIDs:         4.90 cm      LV e' medial:    4.33 cm/s LV PW:         1.30 cm      LV E/e' medial:  42.7 LV IVS:        1.20 cm      LV e' lateral:   3.92 cm/s LVOT diam:     1.60 cm      LV E/e' lateral: 47.2 LV SV:         25 LV SV Index:   13           2D Longitudinal Strain LVOT Area:     2.01 cm     2D Strain GLS (A4C):   7.0 %                             2D Strain GLS (A3C):   8.3 %                             2D Strain GLS (A2C):   7.4 % LV Volumes (MOD)            2D  Strain GLS Avg:     7.5 % LV vol d, MOD A2C: 156.0 ml LV vol d, MOD A4C: 128.0 ml LV vol s, MOD A2C: 111.0 ml LV vol s, MOD A4C: 91.4 ml LV SV MOD A2C:     45.0 ml LV SV MOD A4C:     128.0 ml LV SV MOD BP:      40.2 ml RIGHT VENTRICLE RV Basal diam:  4.20 cm RV Mid diam:    3.60 cm RV S prime:     8.22 cm/s TAPSE (M-mode): 1.7 cm LEFT ATRIUM              Index        RIGHT ATRIUM           Index LA diam:        4.70 cm  2.32 cm/m   RA Area:     21.50 cm LA Vol (A2C):   101.0 ml 49.85 ml/m  RA Volume:   66.70 ml  32.92 ml/m LA Vol (A4C):   93.6 ml  46.19 ml/m LA Biplane Vol: 98.0 ml  48.36 ml/m  AORTIC VALVE                     PULMONIC VALVE AV Area (Vmax):    0.56 cm      PV Vmax:          1.24 m/s AV Area (Vmean):   0.55 cm  PV Peak grad:     6.2 mmHg AV Area (VTI):     0.59 cm      PR End Diast Vel: 4.93 msec AV Vmax:           216.00 cm/s   RVOT Peak grad:   1 mmHg AV Vmean:          155.000 cm/s AV VTI:            0.428 m AV Peak Grad:      18.7 mmHg AV Mean Grad:      11.0 mmHg LVOT Vmax:         60.00 cm/s LVOT Vmean:        42.200 cm/s LVOT VTI:          0.126 m LVOT/AV VTI ratio: 0.29  AORTA Ao Root diam: 2.90 cm Ao Asc diam:  3.10 cm MITRAL VALVE                TRICUSPID VALVE MV Area (PHT): 3.17 cm     TR Peak grad:   48.7 mmHg MV Area VTI:   0.47 cm     TR Vmax:        349.00 cm/s MV Peak grad:  18.0 mmHg MV Mean grad:  8.0 mmHg     SHUNTS MV Vmax:       2.12 m/s     Systemic VTI:  0.13 m MV Vmean:      129.0 cm/s   Systemic Diam: 1.60 cm MV Decel Time: 239 msec MR Peak grad: 93.7 mmHg MR Vmax:      484.00 cm/s MV E velocity: 185.00 cm/s MV A velocity: 152.00 cm/s MV E/A ratio:  1.22 MV A Prime:    7.8 cm/s Dwayne D Callwood MD Electronically signed by Cara JONETTA Lovelace MD Signature Date/Time: 05/10/2024/2:29:31 PM    Final      ECHO as above  TELEMETRY (personally reviewed): sinus rhythm rate 90s  EKG (personally reviewed): Sinus tachycardia rate of 120 nonspecific ST-T wave  changes   DATA reviewed by me 05/12/24: last 24h vitals tele labs imaging I/O, hospitalist progress note  Principal Problem:   Acute on chronic combined systolic and diastolic CHF (congestive heart failure) (HCC) Active Problems:   COPD (chronic obstructive pulmonary disease) (HCC)   OSA on CPAP   Coronary artery disease   Hypotension   Severe aortic stenosis S/P TAVR (transcatheter aortic valve replacement)   Acute on chronic respiratory failure with hypoxia (HCC)   Anxiety and depression   ESRD on hemodialysis (HCC)   SIRS (systemic inflammatory response syndrome) (HCC)   Acute respiratory tract infection, possibly lower   Fluid overload    ASSESSMENT AND PLAN: [name] [name] is a 79 y.o. 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 Cardiology was consulted for further evaluation.   # Acute on chronic hypoxic respiratory failure # ESRD on HD # Acute on chronic HFrEF # S/p TAVR Patient presented for worsening SOB after missing a dialysis session. BNP >35,000. Echo this admission with EF 25-30%, global hypokinesis, moderate to severe LV dilation with mild LVH, grade II diastolic dysfunction. Underwent HD 1/17 with 2.25 L removed.  -  Continue with HD as per nephrology, appreciate their recommendations.  -Continue VI lasix  40 mg daily, unclear if patient makes much urine he was not able to answer this when I asked him.  -Continue aspirin  81 mg daily and zetia  10 mg daily.  -Will consider addition of BB prior to DC. Further additions to GDMT limited by renal function, BP.   This patient's case was discussed and created with Dr. Florencio and he is in agreement.  Signed:   Danita Bloch, PA-C  05/12/2024, 9:34 AM [facility]

## 2024-05-12 NOTE — Progress Notes (Signed)
 Pt receives outpt HD at [facility] MWF at 6:30am. Navigator following to assist with any HD needs.  Suzen Satchel Dialysis Navigator [phone]

## 2024-05-13 ENCOUNTER — Ambulatory Visit: Admission: RE | Admit: 2024-05-13 | Source: Home / Self Care | Admitting: Vascular Surgery

## 2024-05-13 ENCOUNTER — Encounter
Admission: EM | Disposition: A | Discharge status: Home Health | Payer: Self-pay | Source: Home / Self Care | Attending: Student

## 2024-05-13 ENCOUNTER — Ambulatory Visit: Admitting: Family

## 2024-05-13 DIAGNOSIS — T82898A Other specified complication of vascular prosthetic devices, implants and grafts, initial encounter: Secondary | ICD-10-CM

## 2024-05-13 DIAGNOSIS — N186 End stage renal disease: Secondary | ICD-10-CM

## 2024-05-13 DIAGNOSIS — I5043 Acute on chronic combined systolic (congestive) and diastolic (congestive) heart failure: Secondary | ICD-10-CM | POA: Diagnosis not present

## 2024-05-13 DIAGNOSIS — T82858A Stenosis of vascular prosthetic devices, implants and grafts, initial encounter: Secondary | ICD-10-CM

## 2024-05-13 HISTORY — PX: A/V FISTULAGRAM: CATH118298

## 2024-05-13 HISTORY — PX: A/V SHUNT INTERVENTION: CATH118220

## 2024-05-13 LAB — BASIC METABOLIC PANEL WITH GFR
Anion gap: 13 (ref 5–15)
BUN: 42 mg/dL — ABNORMAL HIGH (ref 8–23)
CO2: 29 mmol/L (ref 22–32)
Calcium: 9.1 mg/dL (ref 8.9–10.3)
Chloride: 94 mmol/L — ABNORMAL LOW (ref 98–111)
Creatinine, Ser: 3.79 mg/dL — ABNORMAL HIGH (ref 0.61–1.24)
GFR, Estimated: 15 mL/min — ABNORMAL LOW
Glucose, Bld: 80 mg/dL (ref 70–99)
Potassium: 3.9 mmol/L (ref 3.5–5.1)
Sodium: 136 mmol/L (ref 135–145)

## 2024-05-13 LAB — CBC
HCT: 31.4 % — ABNORMAL LOW (ref 39.0–52.0)
Hemoglobin: 10 g/dL — ABNORMAL LOW (ref 13.0–17.0)
MCH: 31.3 pg (ref 26.0–34.0)
MCHC: 31.8 g/dL (ref 30.0–36.0)
MCV: 98.1 fL (ref 80.0–100.0)
Platelets: 177 K/uL (ref 150–400)
RBC: 3.2 MIL/uL — ABNORMAL LOW (ref 4.22–5.81)
RDW: 15.1 % (ref 11.5–15.5)
WBC: 5.9 K/uL (ref 4.0–10.5)
nRBC: 0 % (ref 0.0–0.2)

## 2024-05-13 MED ORDER — HEPARIN SODIUM (PORCINE) 1000 UNIT/ML IJ SOLN
INTRAMUSCULAR | Status: DC | PRN
  Administered 2024-05-13: 4000 [IU] via INTRAVENOUS

## 2024-05-13 MED ORDER — MIDAZOLAM HCL 2 MG/2ML IJ SOLN
INTRAMUSCULAR | Status: AC
  Filled 2024-05-13: qty 2

## 2024-05-13 MED ORDER — FENTANYL CITRATE (PF) 100 MCG/2ML IJ SOLN
INTRAMUSCULAR | Status: AC
  Filled 2024-05-13: qty 2

## 2024-05-13 MED ORDER — ALTEPLASE 2 MG IJ SOLR: 2.0000 mg | Freq: Once | INTRAMUSCULAR | Status: DC | PRN

## 2024-05-13 MED ORDER — CEFAZOLIN SODIUM-DEXTROSE 1-4 GM/50ML-% IV SOLN
1.0000 g | INTRAVENOUS | Status: DC
  Administered 2024-05-13: 1 g via INTRAVENOUS

## 2024-05-13 MED ORDER — FAMOTIDINE 20 MG PO TABS: 40.0000 mg | ORAL_TABLET | Freq: Once | ORAL | Status: DC | PRN

## 2024-05-13 MED ORDER — MIDAZOLAM HCL 2 MG/ML PO SYRP: 8.0000 mg | ORAL_SOLUTION | Freq: Once | ORAL | Status: DC | PRN

## 2024-05-13 MED ORDER — IPRATROPIUM-ALBUTEROL 0.5-2.5 (3) MG/3ML IN SOLN
RESPIRATORY_TRACT | Status: AC
  Administered 2024-05-13: 3 mL via RESPIRATORY_TRACT
  Filled 2024-05-13: qty 3

## 2024-05-13 MED ORDER — FENTANYL CITRATE (PF) 100 MCG/2ML IJ SOLN
INTRAMUSCULAR | Status: DC | PRN
  Administered 2024-05-13 (×2): 25 ug via INTRAVENOUS

## 2024-05-13 MED ORDER — SODIUM CHLORIDE 0.9 % IV SOLN: INTRAVENOUS | Status: DC

## 2024-05-13 MED ORDER — HEPARIN SODIUM (PORCINE) 1000 UNIT/ML IJ SOLN
INTRAMUSCULAR | Status: AC
  Filled 2024-05-13: qty 10

## 2024-05-13 MED ORDER — IODIXANOL 320 MG/ML IV SOLN
INTRAVENOUS | Status: DC | PRN
  Administered 2024-05-13: 15 mL

## 2024-05-13 MED ORDER — HEPARIN (PORCINE) IN NACL 1000-0.9 UT/500ML-% IV SOLN
INTRAVENOUS | Status: DC | PRN
  Administered 2024-05-13: 500 mL

## 2024-05-13 MED ORDER — HEPARIN SODIUM (PORCINE) 1000 UNIT/ML DIALYSIS: 1000.0000 [IU] | INTRAMUSCULAR | Status: DC | PRN

## 2024-05-13 MED ORDER — DIPHENHYDRAMINE HCL 50 MG/ML IJ SOLN: 50.0000 mg | Freq: Once | INTRAMUSCULAR | Status: DC | PRN

## 2024-05-13 MED ORDER — ALBUMIN HUMAN 25 % IV SOLN
INTRAVENOUS | Status: AC
  Filled 2024-05-13: qty 50

## 2024-05-13 MED ORDER — HYDROMORPHONE HCL 1 MG/ML IJ SOLN
1.0000 mg | Freq: Once | INTRAMUSCULAR | Status: AC | PRN
  Administered 2024-05-14: 1 mg via INTRAVENOUS
  Filled 2024-05-13: qty 1

## 2024-05-13 MED ORDER — CEFAZOLIN SODIUM-DEXTROSE 1-4 GM/50ML-% IV SOLN
INTRAVENOUS | Status: AC
  Filled 2024-05-13: qty 50

## 2024-05-13 MED ORDER — LIDOCAINE HCL (PF) 1 % IJ SOLN
INTRAMUSCULAR | Status: DC | PRN
  Administered 2024-05-13: 5 mL

## 2024-05-13 MED ORDER — METHYLPREDNISOLONE SODIUM SUCC 125 MG IJ SOLR: 125.0000 mg | Freq: Once | INTRAMUSCULAR | Status: DC | PRN

## 2024-05-13 MED ORDER — MIDODRINE HCL 5 MG PO TABS
ORAL_TABLET | ORAL | Status: AC
  Filled 2024-05-13: qty 1

## 2024-05-13 MED ORDER — MIDAZOLAM HCL (PF) 2 MG/2ML IJ SOLN
INTRAMUSCULAR | Status: DC | PRN
  Administered 2024-05-13: 1 mg via INTRAVENOUS
  Administered 2024-05-13: .5 mg via INTRAVENOUS

## 2024-05-13 NOTE — Progress Notes (Signed)
 " [facility]  PROGRESS NOTE   Subjective:   Patient seen and evaluated during dialysis   HEMODIALYSIS FLOWSHEET:  Blood Flow Rate (mL/min): 400 mL/min Arterial Pressure (mmHg): -205.45 mmHg Venous Pressure (mmHg): 176.76 mmHg TMP (mmHg): -5.25 mmHg Ultrafiltration Rate (mL/min): 1219 mL/min Dialysate Flow Rate (mL/min): 49 ml/min Dialysis Fluid Bolus: Normal Saline Bolus Amount (mL): 100 mL  Resting comfortably during treatment  Objective:  Vital signs: Blood pressure (!) 113/59, pulse 81, temperature 97.8 F (36.6 C), temperature source Oral, resp. rate 17, height 5' 5 (1.651 m), weight 84.5 kg, SpO2 99%.  Intake/Output Summary (Last 24 hours) at 05/13/2024 1051 Last data filed at 05/13/2024 0525 Gross per 24 hour  Intake 780 ml  Output 200 ml  Net 580 ml   Filed Weights   05/12/24 0744 05/12/24 1045 05/13/24 0403  Weight: 85.2 kg 83.2 kg 84.5 kg     Physical Exam: General:  No acute distress  Head:  Normocephalic, atraumatic. Moist oral mucosal membranes  Eyes:  Anicteric  Lungs:   rhonchi, normal effort, cough  Heart:  S1S2 no rubs  Abdomen:   Soft, nontender, bowel sounds present  Extremities:  Trace peripheral edema.  Neurologic:  Awake, alert, following commands  Skin:  No lesions  Access: Rt AVF    Basic Metabolic Panel: Recent Labs  Lab 05/09/24 2229 05/11/24 0403 05/12/24 1050 05/13/24 0250  NA 137 135 136 136  K 5.3* 4.1 3.8 3.9  CL 97* 95* 96* 94*  CO2 24 27 25 29   GLUCOSE 134* 87 71 80  BUN 67* 56* 65* 42*  CREATININE 5.75* 4.69* 4.90* 3.79*  CALCIUM 8.4* 8.9 9.2 9.1  MG  --  2.2  --   --   PHOS  --  6.6* 6.5*  --    GFR: Estimated Creatinine Clearance: 15.5 mL/min (A) (by C-G formula based on SCr of 3.79 mg/dL (H)).  Liver Function Tests: Recent Labs  Lab 05/09/24 2229 05/12/24 1050  AST 28  --   ALT 9  --   ALKPHOS 80  --   BILITOT 0.3  --   PROT 6.3*  --   ALBUMIN  3.2* 3.3*   No results for input(s):  LIPASE, AMYLASE in the last 168 hours. No results for input(s): AMMONIA in the last 168 hours.  CBC: Recent Labs  Lab 05/09/24 2033 05/11/24 0403 05/12/24 1050 05/13/24 0250  WBC 10.7* 6.8 5.2 5.9  NEUTROABS 6.9  --   --   --   HGB 10.8* 10.0* 9.2* 10.0*  HCT 35.1* 31.5* 27.7* 31.4*  MCV 104.2* 99.1 97.2 98.1  PLT 167 130* 137* 177     HbA1C: Hemoglobin A1C  Date/Time Value Ref Range Status  02/22/2022 12:00 AM 5.0  Final  07/26/2012 01:11 AM 6.2 4.2 - 6.3 % Final    Comment:    The American Diabetes Association recommends that a primary goal of therapy should be <7% and that physicians should reevaluate the treatment regimen in patients with HbA1c values consistently >8%.    Hgb A1c MFr Bld  Date/Time Value Ref Range Status  01/16/2024 10:20 AM 5.1 4.8 - 5.6 % Final    Comment:             Prediabetes: 5.7 - 6.4          Diabetes: >6.4          Glycemic control for adults with diabetes: <7.0   09/20/2023 10:03 AM 5.0 4.8 - 5.6 %  Final    Comment:             Prediabetes: 5.7 - 6.4          Diabetes: >6.4          Glycemic control for adults with diabetes: <7.0     Urinalysis: No results for input(s): COLORURINE, LABSPEC, PHURINE, GLUCOSEU, HGBUR, BILIRUBINUR, KETONESUR, PROTEINUR, UROBILINOGEN, NITRITE, LEUKOCYTESUR in the last 72 hours.  Invalid input(s): APPERANCEUR    Imaging: No results found.    Medications:    sodium chloride       ceFAZolin  (ANCEF ) IV     ceFEPime  (MAXIPIME ) IV 1 g (05/12/24 2123)    aspirin  EC  81 mg Oral Daily   busPIRone   5 mg Oral QHS   calcitRIOL   0.25 mcg Oral Daily   Chlorhexidine  Gluconate Cloth  6 each Topical Q0600   citalopram   40 mg Oral Daily   doxycycline   100 mg Oral Q12H   ezetimibe   10 mg Oral Daily   furosemide   40 mg Intravenous BID   guaiFENesin   600 mg Oral BID   heparin   5,000 Units Subcutaneous Q8H   metoprolol  succinate  12.5 mg Oral Daily   pantoprazole   40 mg Oral  Daily    Assessment/ Plan:     79 year old male with history of ESRD on hemodialysis Monday Wednesday Friday schedule, COPD on home oxygen , obstructive sleep apnea, diabetes, hypertension, history of TAVR now admitted with history of acute respiratory failure and was initially placed on BiPAP.  Patient was dialyzed last night.   #1: ESRD: Received dialysis yesterday, UF 2L achieved. Remains overloaded, agreeable to UF only treatment today. Next treatment scheduled for Wednesday   #2: Anemia with chronic kidney disease:  Lab Results  Component Value Date   HGB 10.0 (L) 05/13/2024   Hgb 10.0, optimal   #3: Respiratory failure/pneumonia: Remains on cefepime  and doxycycline . Cefazolin  started today   #4: CHF: ECHO completed on 05/10/24 shows EF 25-30% with mild LVH and Grade 2 dd. Continue furosemide  twice daily.  Patient need to be on fluid restriction.Continue fluid removal with dialysis    #5: Secondary hyperparathyroidism: Outpatient labs: PTH 261, phosphorus 6.1, and calcium 9.2 on 05/05/24.   Continue calcitriol  and may consider binders.    LOS: 3 [facility] 1/20/202610:51 AM  "

## 2024-05-13 NOTE — Interval H&P Note (Signed)
 History and Physical Interval Note:  05/13/2024 12:57 PM  [name] [name]  has presented today for surgery, with the diagnosis of R Arm Fistulagram   End Stage Renal.  The various methods of treatment have been discussed with the patient and family. After consideration of risks, benefits and other options for treatment, the patient has consented to  Procedures: A/V Fistulagram (Right) as a surgical intervention.  The patient's history has been reviewed, patient examined, no change in status, stable for surgery.  I have reviewed the patient's chart and labs.  Questions were answered to the patient's satisfaction.     Cordella Shawl

## 2024-05-13 NOTE — Progress Notes (Signed)
" °   05/13/24 1130  Vitals  Temp (!) 97.5 F (36.4 C)  Temp Source Oral  BP 125/61  MAP (mmHg) 81  BP Location Right Arm  BP Method Automatic  Patient Position (if appropriate) Lying  Pulse Rate 84  Pulse Rate Source Monitor  ECG Heart Rate 84  Resp (!) 21  Oxygen  Therapy  SpO2 99 %  O2 Device Nasal Cannula  O2 Flow Rate (L/min) 5 L/min  Patient Activity (if Appropriate) In bed  Pulse Oximetry Type Continuous  Oximetry Probe Site Changed Yes  During Treatment Monitoring  Blood Flow Rate (mL/min) 400 mL/min  Arterial Pressure (mmHg) -205.64 mmHg  Venous Pressure (mmHg) 181.2 mmHg  TMP (mmHg) -7.27 mmHg  Ultrafiltration Rate (mL/min) 1219 mL/min  Dialysate Flow Rate (mL/min) 49 ml/min  Duration of HD Treatment -hour(s) 2.76 hour(s)  Cumulative Fluid Removed (mL) per Treatment  2209.27  HD Safety Checks Performed Yes  Intra-Hemodialysis Comments Progressing as prescribed  Dialysis Fluid Bolus Normal Saline  Post Treatment  Dialyzer Clearance Lightly streaked  Hemodialysis Intake (mL) 0 mL  Liters Processed 72  Fluid Removed (mL) 2500 mL  Tolerated HD Treatment Yes  Fistula / Graft Right Forearm Arteriovenous fistula  Placement Date/Time: 06/27/23 1232   Placed prior to admission: No  Orientation: Right  Access Location: Forearm  Access Type: Arteriovenous fistula  Site Condition No complications  Fistula / Graft Assessment Present;Thrill;Bruit  Status Patent  Drainage Description None   Tolerated tx well. Pt removed 2.5 L. Meds given Albumin  12.5 mg and Midodrine  5 mgs. Vss, no issues with tx today. MD ordered for pt be on 3 L as what he uses at home with a o2 at 94 % to 96 %.Report given to floor nurse.  "

## 2024-05-13 NOTE — Plan of Care (Signed)

## 2024-05-13 NOTE — Progress Notes (Signed)
 [facility] CARDIOLOGY PROGRESS NOTE   Patient ID: [name] [name] MRN: [id number] DOB/AGE: 81-Mar-1947 81 y.o.  Admit date: 05/09/2024 Referring Physician Dr. Elvan Sor Primary Physician Orlean Alan HERO, FNP  Primary Cardiologist Dr. Denyse Bathe Reason for Consultation SOB, respiratory distress  HPI: [name] [name] is a 79 y.o. 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 Cardiology was consulted for further evaluation.   Interval History:  -Patient seen and examined this AM during UF treatment, feeling ok.  -States SOB is improving. LE improving as well.  -BP and HR stable with no events on tele.  Review of systems complete and found to be negative unless listed above   Vitals:   05/13/24 0403 05/13/24 0743 05/13/24 0830 05/13/24 0840  BP:   129/65 (!) 128/54  Pulse:   96 74  Resp:   (!) 24 (!) 25  Temp:  (P) 97.8 F (36.6 C) 97.8 F (36.6 C)   TempSrc:  (P) Oral Oral   SpO2:   95% 90%  Weight: 84.5 kg     Height:         Intake/Output Summary (Last 24 hours) at 05/13/2024 9081 Last data filed at 05/13/2024 0525 Gross per 24 hour  Intake 780 ml  Output 2200 ml  Net -1420 ml     PHYSICAL EXAM General: Chronically ill appearing male, well nourished, in no acute distress. HEENT: Normocephalic and atraumatic. Neck: No JVD.  Lungs: Normal respiratory effort on 5L [place]. Clear bilaterally to auscultation. No wheezes, crackles, rhonchi.  Heart: HRRR. Normal S1 and S2 without gallops or murmurs. Radial & DP pulses 2+ bilaterally. Abdomen: Non-distended appearing.  Msk: Normal strength and tone for age. Extremities: No clubbing, cyanosis or edema.    Neuro: Alert and oriented X 3. Psych: Mood appropriate, affect congruent.    LABS: Basic Metabolic Panel: Recent Labs    05/11/24 0403 05/12/24 1050 05/13/24 0250  NA 135 136 136  K 4.1 3.8 3.9  CL 95* 96* 94*  CO2 27 25 29   GLUCOSE 87 71 80  BUN 56* 65* 42*  CREATININE 4.69* 4.90* 3.79*  CALCIUM 8.9 9.2 9.1  MG 2.2  --   --   PHOS 6.6* 6.5*  --    Liver Function Tests: Recent Labs    05/12/24 1050  ALBUMIN  3.3*   No results for input(s): LIPASE, AMYLASE in the last 72 hours. CBC: Recent Labs    05/12/24 1050 05/13/24 0250  WBC 5.2 5.9  HGB 9.2* 10.0*  HCT 27.7* 31.4*  MCV 97.2 98.1  PLT 137* 177   Cardiac Enzymes: No results for input(s): CKTOTAL, CKMB, CKMBINDEX, TROPONINIHS in the last 72 hours. BNP: No results for input(s): BNP in the last 72 hours. D-Dimer: No results for input(s): DDIMER in the last 72 hours. Hemoglobin A1C: No results for input(s): HGBA1C in the last 72 hours. Fasting Lipid Panel: No results for input(s): CHOL, HDL, LDLCALC, TRIG, CHOLHDL, LDLDIRECT in the last 72 hours. Thyroid  Function Tests: No results for input(s): TSH, T4TOTAL, T3FREE, THYROIDAB in the last 72 hours.  Invalid input(s): FREET3 Anemia Panel: No results for input(s): VITAMINB12, FOLATE, FERRITIN, TIBC, IRON, RETICCTPCT in the last 72  hours.  No results found.    ECHO as above  TELEMETRY (personally reviewed): sinus rhythm rate 90s  EKG (personally reviewed): Sinus tachycardia rate of 120 nonspecific ST-T wave changes   DATA reviewed by me 05/13/24: last 24h vitals tele labs imaging I/O, hospitalist progress note  Principal Problem:   Acute on chronic combined systolic and diastolic CHF (congestive heart failure) (HCC) Active Problems:   COPD (chronic obstructive pulmonary disease) (HCC)   OSA on CPAP   Coronary artery disease   Hypotension   Severe aortic stenosis S/P TAVR (transcatheter aortic  valve replacement)   Acute on chronic respiratory failure with hypoxia (HCC)   Anxiety and depression   ESRD on hemodialysis (HCC)   SIRS (systemic inflammatory response syndrome) (HCC)   Acute respiratory tract infection, possibly lower   Fluid overload    ASSESSMENT AND PLAN: [name] [name] is a 79 y.o. 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 Cardiology was consulted for further evaluation.   # Acute on chronic hypoxic respiratory failure # ESRD on HD # Acute on chronic HFrEF # S/p TAVR Patient presented for worsening SOB after missing a dialysis session. BNP >35,000. Echo this admission with EF 25-30%, global hypokinesis, moderate to severe LV dilation with mild LVH, grade II diastolic dysfunction. Underwent HD 1/17 with 2.25 L removed and 1/19 with 2 L removed.  -Continue with HD as per nephrology, appreciate their recommendations.  -Continue IV lasix  40 mg twice daily, unclear if patient makes much urine he was not able to answer this when I asked him.  -Continue aspirin  81 mg daily and zetia  10 mg daily.  -Will consider addition of BB prior to DC. Further additions to GDMT limited by renal function, BP.   This patient's case was discussed and created with Dr. Florencio and he is in agreement.  Signed:  Danita Bloch, PA-C  05/13/2024, 9:18 AM [facility]

## 2024-05-13 NOTE — Progress Notes (Signed)
 [facility] Progress Note  Patient: [name] [name]    FMW:[id number]  DOA: 05/09/2024     Date of Service: the patient was seen and examined on 05/13/2024  Chief Complaint  Patient presents with   Respiratory Distress   Brief hospital course: [name] [name] is a 79 y.o. 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  History provided by wife at bedside.  Patient 's last session was Monday Jan 12. He missed his Friday dialysis session due to feeling unwell with fever cough and malaise.  Later in the day he went into acute respiratory distress.  EMS brought him in on CPAP. In the ED, he was febrile to 100.8 heart rate 96 and tachypneic to the high 20s with O2 sat of 100% on CPAP.  BP was initially normal at 115/67, trending down to as low as 91/44 during workup in the ED. VBG on BiPAP with pH 7.25 and pCO2 58. Labs otherwise notable for WBC 10.7 with lactic acid 1.8 and negative respiratory viral panel. Hemoglobin at baseline at 10.8. CMP with results in keeping with dialysis status.  Mild hyperkalemia 5.3 Troponin 68 and proBNP> 35,000 Chest x-ray consistent with pulmonary edema.   The ED provider spoke with nephrologist, Dr. Dominica, who will be making arrangements for urgent dialysis or dialysis first thing in the AM.   Patient was started on cefepime  and doxycycline  for possible pneumonia   Admission requested    Assessment and Plan:   # Acute on chronic combined systolic and diastolic CHF (congestive heart failure) # Severe aortic valve stenosis, severe  MR, moderate TR # Acute respiratory failure with hypoxia # Volume overload secondary to missed dialysis # ESRD on hemodialysis M&F Patient was hypotensive that is why Lasix  was not given on admission  Nephrology was consulted, patient got hemodialysis done. Continue BiPAP as needed Continue supplemental O2 inhalation and gradually wean down Patient was on metolazone and metoprolol  at home TTE LVEF 25 to 30%, grade 2 DD, severe TR, moderate MR, severe AAS 1/17 started Lasix  40 mg IV twice daily Nephrology following, patient received hemodialysis early morning today, will be hemodialysis per nephrology. 1/18 cardiology consulted for further recommendation due to worsening of CHF and significant valvular abnormality. Cardiology recommending beta-blocker, cannot tolerate GDMT due to ESRD 1/19 started Toprol  XL 12.5 mg p.o. daily  # Hypotension BP as low as 90s over 40s in the ED in the setting of acute respiratory tract infection Possibly related to sepsis, versus home antihypertensives Treating SIRS/sepsis as outlined on the specific problem May need pressors if unable to maintain MAP over 65 Close hemodynamic monitoring Addendum: Improving on reassessment during dialysis.  Albumin  to be administered if needed 1/17 started midodrine  5 mg p.o. 3 times daily as needed if SBP less than 100 mmHg   # Acute respiratory tract infection, possibly lower SIRS, possible sepsis SIRS criteria include fever, tachycardia, tachypnea with leukocytosis Patient presented with cough, fever and malaise, negative respiratory viral panel Continue empiric treatment with cefepime  and doxycycline  for possible lower respiratory tract infection Antitussives Supplemental oxygen    ESRD on hemodialysis  Anemia of  ESRD Patient only dialyzes twice a week on Mondays and Fridays Nephrology arranging urgent dialysis 1/18 as per patient's wife patient is scheduled for AV fistulogram by vascular surgery on 1/20 1/20  vascular surgery consulted, s/p AV fistulogram and stent insertion 1/20 lower extremity edema R>L, follow venous Doppler right lower extremity to rule out DVT    Severe aortic stenosis S/P TAVR (transcatheter aortic valve replacement) No acute issues suspected at this time   OSA on CPAP CPAP nightly once weaned off BiPAP   COPD (chronic obstructive pulmonary disease) (HCC) Acute exacerbation not suspected at this time Continue home inhalers DuoNebs as needed   Coronary artery disease Continue home aspirin  and ezetimibe . Holding metoprolol  due to low blood pressure   Anxiety and depression Continue buspirone  and citalopram      Body mass index is 30.62 kg/m.  Interventions:  Diet: Heart healthy diet DVT Prophylaxis: Subcutaneous Heparin     Consults: Nephrology, cardiology, vascular surgery Procedures: 1- Contrast injection right arm brachiocephalic fistula 2- Percutaneous transluminal angioplasty and stent placement right arm brachiocephalic fistula  Advance goals of care discussion: DNR-limited  Family Communication: family was present at bedside, at the time of interview.  The pt provided permission to discuss medical plan with the family. Opportunity was given to ask question and all questions were answered satisfactorily.  1/19 d/w family at bedside  Disposition:  Pt is from Home , admitted with volume overload, ESRD missed hemodialysis, still has shortness of breath, which precludes a safe discharge. Discharge to home, when stable, most likely tomorrow a.m.  Subjective: No significant event overnight.  Patient was seen during hemodialysis, still has some shortness of breath.  No chest pain Apo-Prazo, no new other complaints.   Physical Exam: General: NAD, lying comfortably Appear in no distress, affect appropriate Eyes: PERRLA ENT: Oral Mucosa Clear, moist  Neck: no JVD,  Cardiovascular: S1 and S2 Present, audible murmur,  Respiratory: good air entry  bilaterally, bilateral crackles, no significant wheezes Abdomen: Bowel Sound present, Soft and no tenderness,  Skin: no rashes Extremities: 2+ b/l Pedal edema, R>L Neurologic: without any new focal findings Gait not checked due to patient safety concerns  Vitals:   05/13/24 1455 05/13/24 1500 05/13/24 1515 05/13/24 1530  BP: (!) 118/57 (!) 127/58 (!) 121/50 (!) 129/55  Pulse: 87  84 93  Resp:  (!) 21 18 (!) 24  Temp:      TempSrc:      SpO2: 91% (!) 89% 96% 93%  Weight:      Height:        Intake/Output Summary (Last 24 hours) at 05/13/2024 1642 Last data filed at 05/13/2024 1130 Gross per 24 hour  Intake 780 ml  Output 2700 ml  Net -1920 ml   Filed Weights   05/13/24 0830 05/13/24 1130 05/13/24 1236  Weight: 85.9 kg 83.4 kg 83.5 kg    Data Reviewed: I have personally reviewed and interpreted daily labs, tele strips, imagings as discussed above. I reviewed all nursing notes, pharmacy notes, vitals, pertinent old records I have discussed plan of care as described above with RN and patient/family.  CBC: Recent Labs  Lab 05/09/24 2033 05/11/24 0403 05/12/24 1050 05/13/24 0250  WBC 10.7* 6.8 5.2 5.9  NEUTROABS 6.9  --   --   --   HGB 10.8* 10.0* 9.2* 10.0*  HCT 35.1* 31.5* 27.7* 31.4*  MCV 104.2* 99.1 97.2 98.1  PLT 167 130* 137* 177   Basic Metabolic Panel: Recent Labs  Lab 05/09/24 2229  05/11/24 0403 05/12/24 1050 05/13/24 0250  NA 137 135 136 136  K 5.3* 4.1 3.8 3.9  CL 97* 95* 96* 94*  CO2 24 27 25 29   GLUCOSE 134* 87 71 80  BUN 67* 56* 65* 42*  CREATININE 5.75* 4.69* 4.90* 3.79*  CALCIUM 8.4* 8.9 9.2 9.1  MG  --  2.2  --   --   PHOS  --  6.6* 6.5*  --     Studies: PERIPHERAL VASCULAR CATHETERIZATION Result Date: 05/13/2024 See surgical note for result.    Scheduled Meds:  aspirin  EC  81 mg Oral Daily   busPIRone   5 mg Oral QHS   calcitRIOL   0.25 mcg Oral Daily   Chlorhexidine  Gluconate Cloth  6 each Topical Q0600   citalopram   40 mg Oral  Daily   doxycycline   100 mg Oral Q12H   ezetimibe   10 mg Oral Daily   furosemide   40 mg Intravenous BID   guaiFENesin   600 mg Oral BID   heparin   5,000 Units Subcutaneous Q8H   metoprolol  succinate  12.5 mg Oral Daily   pantoprazole   40 mg Oral Daily   Continuous Infusions:  ceFEPime  (MAXIPIME ) IV 1 g (05/12/24 2123)   PRN Meds: acetaminophen  **OR** acetaminophen , HYDROmorphone  (DILAUDID ) injection, ipratropium-albuterol , midodrine , ondansetron  **OR** ondansetron  (ZOFRAN ) IV, oxyCODONE   Time spent: 55 minutes  Author: ELVAN SOR. MD [facility] 05/13/2024 4:42 PM  To reach On-call, see care teams to locate the attending and reach out to them via [email or web address]. If 7PM-7AM, please contact night-coverage If you still have difficulty reaching the attending provider, please page the [facility] (Director on Call) for [facility] on amion for assistance.

## 2024-05-13 NOTE — Op Note (Signed)
 OPERATIVE NOTE   PROCEDURE: Contrast injection right arm brachiocephalic fistula Percutaneous transluminal angioplasty and stent placement right arm brachiocephalic fistula  PRE-OPERATIVE DIAGNOSIS: Complication of dialysis access                                                       End Stage Renal Disease  POST-OPERATIVE DIAGNOSIS: same as above   SURGEON: [name] JUDITHANN Shawl, M.D.  ANESTHESIA: Conscious sedation was administered under my direct supervision by the interventional radiology RN. IV Versed  plus fentanyl  were utilized. Continuous ECG, pulse oximetry and blood pressure was monitored throughout the entire procedure.  Conscious sedation was for a total of 28 minutes.  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 male who  presents with malfunctioning right arm brachiocephalic AV access.  The patient is scheduled for angiography with possible intervention of the AV access.  The patient is aware the risks include but are not limited to: bleeding, infection, thrombosis of the cannulated access, and possible anaphylactic reaction to the contrast.  The patient acknowledges if the access can not be salvaged a tunneled catheter will be needed and will be placed during this procedure.  The patient is aware of the risks of the procedure and elects to proceed with the angiogram and intervention.  DESCRIPTION: After full informed written consent was obtained, the patient was brought back to the Special Procedure suite and placed supine position.  Appropriate cardiopulmonary monitors were placed.  The right arm was prepped and draped in the standard fashion.  Appropriate timeout is called. The right brachiocephalic fistula was cannulated with a micropuncture needle just above the anastomosis.  Cannulation was  performed with ultrasound guidance. Ultrasound was placed in a sterile sleeve, the AV access was interrogated and noted to be echolucent and compressible indicating patency. Image was recorded for the permanent record. The puncture is performed under continuous ultrasound visualization.   The microwire was advanced and the needle was exchanged for  a microsheath.  The J-wire was then advanced and a 6 Fr sheath inserted.  Hand injections were completed to image the access from the arterial anastomosis through the entire access.  The central venous structures were also imaged by hand injections.  Based on the images,  4000 units of heparin  was given and a wire was negotiated through the strictures within the venous portion of the graft.  An 8 mm x 100 mm Viabahn was deployed across the stenoses and postdilated with an 8 mm [place] balloon.  Follow-up imaging demonstrated there was still flow into the aneurysmal segment around the more proximal end of the stent and I extended the stent with a 8 mm x 50 mm Viabahn overlapping the initial stent.  This was postdilated with the same 8 mm balloon inflated to 6 atm.  Follow-up imaging demonstrates complete resolution of the stricture with rapid flow of contrast through the graft, the central venous anatomy is preserved.  A 4-0 Monocryl purse-string suture was sewn around the sheath.  The sheath was removed and light pressure was applied.  A sterile bandage was applied to the puncture site.    COMPLICATIONS: None  CONDITION: [name] [name]  JUDITHANN Shawl, M.D [facility] Office: [phone]  05/13/2024 3:38 PM

## 2024-05-13 NOTE — Care Management Important Message (Signed)
 Important Message  Patient Details  Name: [name] [name] MRN: [id number] Date of Birth: [birth year]   Important Message Given:  Yes - Medicare IM     Adelaide Pfefferkorn W, CMA 05/13/2024, 11:55 AM

## 2024-05-14 ENCOUNTER — Encounter: Payer: Self-pay | Admitting: Vascular Surgery

## 2024-05-14 ENCOUNTER — Inpatient Hospital Stay

## 2024-05-14 DIAGNOSIS — N186 End stage renal disease: Secondary | ICD-10-CM | POA: Diagnosis not present

## 2024-05-14 DIAGNOSIS — R6510 Systemic inflammatory response syndrome (SIRS) of non-infectious origin without acute organ dysfunction: Secondary | ICD-10-CM | POA: Diagnosis not present

## 2024-05-14 DIAGNOSIS — J9621 Acute and chronic respiratory failure with hypoxia: Secondary | ICD-10-CM | POA: Diagnosis not present

## 2024-05-14 DIAGNOSIS — F419 Anxiety disorder, unspecified: Secondary | ICD-10-CM

## 2024-05-14 DIAGNOSIS — I5043 Acute on chronic combined systolic (congestive) and diastolic (congestive) heart failure: Secondary | ICD-10-CM | POA: Diagnosis not present

## 2024-05-14 DIAGNOSIS — E8770 Fluid overload, unspecified: Secondary | ICD-10-CM | POA: Diagnosis not present

## 2024-05-14 DIAGNOSIS — Z952 Presence of prosthetic heart valve: Secondary | ICD-10-CM

## 2024-05-14 DIAGNOSIS — G4733 Obstructive sleep apnea (adult) (pediatric): Secondary | ICD-10-CM

## 2024-05-14 DIAGNOSIS — Z992 Dependence on renal dialysis: Secondary | ICD-10-CM

## 2024-05-14 DIAGNOSIS — J988 Other specified respiratory disorders: Secondary | ICD-10-CM

## 2024-05-14 DIAGNOSIS — J431 Panlobular emphysema: Secondary | ICD-10-CM

## 2024-05-14 DIAGNOSIS — F32A Depression, unspecified: Secondary | ICD-10-CM

## 2024-05-14 DIAGNOSIS — I959 Hypotension, unspecified: Secondary | ICD-10-CM

## 2024-05-14 LAB — CULTURE, BLOOD (ROUTINE X 2)
Culture: NO GROWTH
Culture: NO GROWTH

## 2024-05-14 LAB — BASIC METABOLIC PANEL WITH GFR
Anion gap: 13 (ref 5–15)
BUN: 52 mg/dL — ABNORMAL HIGH (ref 8–23)
CO2: 26 mmol/L (ref 22–32)
Calcium: 9 mg/dL (ref 8.9–10.3)
Chloride: 99 mmol/L (ref 98–111)
Creatinine, Ser: 4.25 mg/dL — ABNORMAL HIGH (ref 0.61–1.24)
GFR, Estimated: 13 mL/min — ABNORMAL LOW
Glucose, Bld: 121 mg/dL — ABNORMAL HIGH (ref 70–99)
Potassium: 3.8 mmol/L (ref 3.5–5.1)
Sodium: 138 mmol/L (ref 135–145)

## 2024-05-14 LAB — CBC
HCT: 30.1 % — ABNORMAL LOW (ref 39.0–52.0)
Hemoglobin: 9.7 g/dL — ABNORMAL LOW (ref 13.0–17.0)
MCH: 31.7 pg (ref 26.0–34.0)
MCHC: 32.2 g/dL (ref 30.0–36.0)
MCV: 98.4 fL (ref 80.0–100.0)
Platelets: 144 K/uL — ABNORMAL LOW (ref 150–400)
RBC: 3.06 MIL/uL — ABNORMAL LOW (ref 4.22–5.81)
RDW: 15.4 % (ref 11.5–15.5)
WBC: 5.6 K/uL (ref 4.0–10.5)
nRBC: 0 % (ref 0.0–0.2)

## 2024-05-14 MED ORDER — PENTAFLUOROPROP-TETRAFLUOROETH EX AERO
INHALATION_SPRAY | CUTANEOUS | Status: AC
  Filled 2024-05-14: qty 30

## 2024-05-14 NOTE — Plan of Care (Signed)
   Problem: Education: Goal: Knowledge of General Education information will improve Description Including pain rating scale, medication(s)/side effects and non-pharmacologic comfort measures Outcome: Progressing

## 2024-05-14 NOTE — Progress Notes (Addendum)
 [facility] Progress Note  Patient: [name] [name]    FMW:[id number]  DOA: 05/09/2024     Date of Service: the patient was seen and examined on 05/14/2024  Chief Complaint  Patient presents with   Respiratory Distress   Brief hospital course: [name] [name] is a 79 y.o. 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  History provided by wife at bedside.  Patient 's last session was Monday Jan 12. He missed his Friday dialysis session due to feeling unwell with fever cough and malaise.  Later in the day he went into acute respiratory distress.  EMS brought him in on CPAP. In the ED, he was febrile to 100.8 heart rate 96 and tachypneic to the high 20s with O2 sat of 100% on CPAP.  BP was initially normal at 115/67, trending down to as low as 91/44 during workup in the ED. VBG on BiPAP with pH 7.25 and pCO2 58. Labs otherwise notable for WBC 10.7 with lactic acid 1.8 and negative respiratory viral panel. Hemoglobin at baseline at 10.8. CMP with results in keeping with dialysis status.  Mild hyperkalemia 5.3 Troponin 68 and proBNP> 35,000 Chest x-ray consistent with pulmonary edema.   Patient was taken for urgent dialysis.   Patient was started on cefepime  and doxycycline  for possible pneumonia   1/21: Still feeling short of breath, going for an extra session of dialysis.  Nephrology is  going to increase the frequency of dialysis from twice weekly to 3 times a week. PT and OT evaluation ordered.   Assessment and Plan:   # Acute on chronic combined systolic and  diastolic CHF (congestive heart failure) # Severe aortic valve stenosis, severe MR, moderate TR # Acute respiratory failure with hypoxia # Volume overload secondary to missed dialysis # ESRD on hemodialysis M&F Patient was hypotensive that is why Lasix  was not given on admission  Nephrology was consulted, patient got hemodialysis done. Continue BiPAP as needed Continue supplemental O2 inhalation and gradually wean down Patient was on metolazone and metoprolol  at home TTE LVEF 25 to 30%, grade 2 DD, severe TR, moderate MR, severe AAS 1/17 started Lasix  40 mg IV twice daily Nephrology following, patient received hemodialysis early morning today, will be hemodialysis per nephrology. 1/18 cardiology consulted for further recommendation due to worsening of CHF and significant valvular abnormality. Cardiology recommending beta-blocker, cannot tolerate GDMT due to ESRD 1/19 started Toprol  XL 12.5 mg p.o. daily  # Hypotension BP as low as 90s over 40s in the ED in the setting of acute respiratory tract infection 1/17 started midodrine  5 mg p.o. 3 times daily as needed if SBP less than 100 mmHg Blood pressure currently within goal. -Continuing as needed midodrine  for systolic less than 100   # Acute respiratory tract infection,  Sepsis secondary to pneumonia Sepsis criteria include fever, tachycardia, tachypnea with leukocytosis, likely secondary to pneumonia Continue empiric treatment with cefepime  and doxycycline  for possible lower respiratory tract infection-will complete a 5-day course Antitussives Supplemental oxygen    ESRD on hemodialysis  Anemia of ESRD Patient only dialyzes twice a week on Mondays and Fridays Nephrology arranging  urgent dialysis 1/18 as per patient's wife patient is scheduled for AV fistulogram by vascular surgery on 1/20 1/20 vascular surgery consulted, s/p AV fistulogram and stent insertion 1/20 lower extremity edema R>L, lower extremity venous Doppler  negative 1/21: Getting extra session of dialysis and outpatient dialysis frequency to be increased to 3 times a week instead of twice weekly   Severe aortic stenosis S/P TAVR (transcatheter aortic valve replacement) No acute issues suspected at this time Continue outpatient cardiology follow-up   OSA on CPAP CPAP nightly once weaned off BiPAP   COPD (chronic obstructive pulmonary disease) (HCC) Acute exacerbation not suspected at this time Continue home inhalers DuoNebs as needed   Coronary artery disease Continue home aspirin  and ezetimibe . Holding metoprolol  due to low blood pressure   Anxiety and depression Continue buspirone  and citalopram    Body mass index is 29.39 kg/m.  Interventions:  Diet: Heart healthy diet DVT Prophylaxis: Subcutaneous Heparin     Consults: Nephrology, cardiology, vascular surgery Procedures: 1- Contrast injection right arm brachiocephalic fistula 2- Percutaneous transluminal angioplasty and stent placement right arm brachiocephalic fistula  Advance goals of care discussion: DNR-limited  Family Communication: Discussed with patient  Disposition:  Pt is from Home , admitted with volume overload, ESRD missed hemodialysis, still has shortness of breath, which precludes a safe discharge. Discharge to home, when stable, most likely tomorrow a.m.  Subjective: Patient was still feeling short of breath and requiring 3 to 4 L of oxygen  when seen today.  Physical Exam:  Vitals:   05/14/24 0903 05/14/24 1210 05/14/24 1330 05/14/24 1439  BP: (!) 115/51 (!) 109/50 123/65 (!) 125/56  Pulse: 97 91 95 60  Resp: 18 18 (!) 30 (!) 27  Temp: 97.6 F (36.4 C) 97.9 F (36.6 C) 98.3 F (36.8 C)   TempSrc:   Oral   SpO2: 95% 97% 99% 100%  Weight:   80.1 kg   Height:        Intake/Output Summary (Last 24 hours) at 05/14/2024 1524 Last data filed at 05/14/2024 1212 Gross per 24 hour  Intake 580 ml  Output 350 ml  Net 230 ml   Filed Weights   05/13/24  1236 05/14/24 0555 05/14/24 1330  Weight: 83.5 kg 80.3 kg 80.1 kg   General.  Frail elderly man, in no acute distress. Pulmonary.  Lungs clear bilaterally, normal respiratory effort. CV.  Regular rate and rhythm, no JVD, rub or murmur. Abdomen.  Soft, nontender, nondistended, BS positive. CNS.  Alert and oriented .  No focal neurologic deficit. Extremities.  2+ LE edema, right more than left, pulses intact and symmetrical. Psychiatry.  Judgment and insight appears normal.   Data Reviewed: I have personally reviewed and interpreted daily labs, tele strips, imagings as discussed above. I reviewed all nursing notes, pharmacy notes, vitals, pertinent old records I have discussed plan of care as described above with RN and patient/family.  CBC: Recent Labs  Lab 05/09/24 2033 05/11/24 0403 05/12/24 1050 05/13/24 0250 05/14/24 0424  WBC 10.7* 6.8 5.2 5.9 5.6  NEUTROABS 6.9  --   --   --   --   HGB 10.8* 10.0* 9.2* 10.0* 9.7*  HCT 35.1* 31.5* 27.7* 31.4* 30.1*  MCV 104.2* 99.1 97.2 98.1 98.4  PLT 167 130* 137* 177 144*   Basic Metabolic Panel: Recent Labs  Lab 05/09/24 2229 05/11/24 0403 05/12/24 1050 05/13/24 0250 05/14/24 0424  NA 137 135 136 136 138  K 5.3* 4.1 3.8 3.9 3.8  CL 97* 95* 96*  94* 99  CO2 24 27 25 29 26   GLUCOSE 134* 87 71 80 121*  BUN 67* 56* 65* 42* 52*  CREATININE 5.75* 4.69* 4.90* 3.79* 4.25*  CALCIUM 8.4* 8.9 9.2 9.1 9.0  MG  --  2.2  --   --   --   PHOS  --  6.6* 6.5*  --   --     Studies: US  Venous Img Lower Unilateral Right (DVT) Result Date: 05/14/2024 CLINICAL DATA:  Edema EXAM: RIGHT LOWER EXTREMITY VENOUS DOPPLER ULTRASOUND TECHNIQUE: Gray-scale sonography with compression, as well as color and duplex ultrasound, were performed to evaluate the deep venous system(s) from the level of the common femoral vein through the popliteal and proximal calf veins. COMPARISON:  None available FINDINGS: VENOUS Normal compressibility of the common femoral,  femoral, and popliteal veins, as well as the visualized calf veins. Visualized portions of profunda femoral vein and great saphenous vein unremarkable. No filling defects to suggest DVT on grayscale or color Doppler imaging. Doppler waveforms show normal direction of venous flow, normal respiratory plasticity and response to augmentation. Limited views of the contralateral common femoral vein are unremarkable. OTHER None. Limitations: none IMPRESSION: No right lower extremity DVT. Electronically Signed   By: Aliene Lloyd M.D.   On: 05/14/2024 07:34     Scheduled Meds:  aspirin  EC  81 mg Oral Daily   busPIRone   5 mg Oral QHS   calcitRIOL   0.25 mcg Oral Daily   Chlorhexidine  Gluconate Cloth  6 each Topical Q0600   citalopram   40 mg Oral Daily   doxycycline   100 mg Oral Q12H   ezetimibe   10 mg Oral Daily   furosemide   40 mg Intravenous BID   guaiFENesin   600 mg Oral BID   heparin   5,000 Units Subcutaneous Q8H   metoprolol  succinate  12.5 mg Oral Daily   pantoprazole   40 mg Oral Daily   Continuous Infusions:  ceFEPime  (MAXIPIME ) IV 1 g (05/13/24 2122)   PRN Meds: acetaminophen  **OR** acetaminophen , HYDROmorphone  (DILAUDID ) injection, ipratropium-albuterol , midodrine , ondansetron  **OR** ondansetron  (ZOFRAN ) IV, oxyCODONE   Time spent: 50 minutes  This record has been created using Conservation officer, historic buildings. Errors have been sought and corrected,but may not always be located. Such creation errors do not reflect on the standard of care.   Author: Amaryllis Dare. MD [facility] 05/14/2024 3:24 PM  To reach On-call, see care teams to locate the attending and reach out to them via [email or web address]. If 7PM-7AM, please contact night-coverage If you still have difficulty reaching the attending provider, please page the [facility] (Director on Call) for [facility] on amion for assistance.

## 2024-05-14 NOTE — Plan of Care (Signed)

## 2024-05-14 NOTE — Progress Notes (Signed)
 [facility] CLINIC CARDIOLOGY PROGRESS NOTE   Patient ID: [name] [name] MRN: [id number] DOB/AGE: 1945/12/29 79 y.o.  Admit date: 05/09/2024 Referring Physician Dr. Elvan Sor Primary Physician Orlean Alan HERO, FNP  Primary Cardiologist Dr. Denyse Bathe Reason for Consultation SOB, respiratory distress  HPI: [name] [name] is a 79 y.o. 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 Cardiology was consulted for further evaluation.   Interval History:  -Patient seen and examined this AM, reports feeling not as good today as yesterday. -Reports continued shortness of breath and congestion this a.m.  Lower extremity edema does appear somewhat improved. -BP and HR stable with no events on tele.  Review of systems complete and found to be negative unless listed above   Vitals:   05/13/24 1950 05/14/24 0005 05/14/24 0442 05/14/24 0555  BP: (!) 97/48 (!) 109/52 115/61   Pulse: 84 94 81   Resp: 18 18 18    Temp: 98.3 F (36.8 C) 98.1 F (36.7 C) 97.7 F (36.5 C)   TempSrc:      SpO2: 94% 99% 100%   Weight:    80.3 kg  Height:         Intake/Output Summary (Last 24 hours) at 05/14/2024 0848 Last data filed at 05/14/2024 0534 Gross per 24 hour  Intake 340 ml  Output 2600 ml  Net -2260 ml     PHYSICAL EXAM General: Chronically ill appearing male, well nourished, in no acute distress. HEENT: Normocephalic and atraumatic. Neck: No JVD.  Lungs: Normal respiratory effort on 5L [place]. Clear bilaterally to auscultation. No wheezes, crackles, rhonchi.  Heart: HRRR. Normal S1 and S2 without gallops or murmurs. Radial & DP pulses 2+ bilaterally. Abdomen: Non-distended  appearing.  Msk: Normal strength and tone for age. Extremities: No clubbing, cyanosis or edema.   Neuro: Alert and oriented X 3. Psych: Mood appropriate, affect congruent.    LABS: Basic Metabolic Panel: Recent Labs    05/12/24 1050 05/13/24 0250 05/14/24 0424  NA 136 136 138  K 3.8 3.9 3.8  CL 96* 94* 99  CO2 25 29 26   GLUCOSE 71 80 121*  BUN 65* 42* 52*  CREATININE 4.90* 3.79* 4.25*  CALCIUM 9.2 9.1 9.0  PHOS 6.5*  --   --    Liver Function Tests: Recent Labs    05/12/24 1050  ALBUMIN  3.3*   No results for input(s): LIPASE, AMYLASE in the last 72 hours. CBC: Recent Labs    05/13/24 0250 05/14/24 0424  WBC 5.9 5.6  HGB 10.0* 9.7*  HCT 31.4* 30.1*  MCV 98.1 98.4  PLT 177 144*   Cardiac Enzymes: No results for input(s): CKTOTAL, CKMB, CKMBINDEX, TROPONINIHS in the last 72 hours. BNP: No results for input(s): BNP in the last 72 hours. D-Dimer: No results for input(s): DDIMER in the last 72 hours. Hemoglobin A1C: No results for input(s): HGBA1C in the last 72 hours. Fasting Lipid Panel: No results for input(s): CHOL, HDL, LDLCALC, TRIG, CHOLHDL, LDLDIRECT in the last 72 hours. Thyroid  Function Tests: No results for input(s): TSH, T4TOTAL, T3FREE, THYROIDAB in the last 72 hours.  Invalid input(s): FREET3 Anemia Panel: No results for input(s): VITAMINB12, FOLATE, FERRITIN, TIBC, IRON, RETICCTPCT in  the last 72 hours.  US  Venous Img Lower Unilateral Right (DVT) Result Date: 05/14/2024 CLINICAL DATA:  Edema EXAM: RIGHT LOWER EXTREMITY VENOUS DOPPLER ULTRASOUND TECHNIQUE: Gray-scale sonography with compression, as well as color and duplex ultrasound, were performed to evaluate the deep venous system(s) from the level of the common femoral vein through the popliteal and proximal calf veins. COMPARISON:  None available FINDINGS: VENOUS Normal compressibility of the common femoral, femoral, and popliteal veins, as  well as the visualized calf veins. Visualized portions of profunda femoral vein and great saphenous vein unremarkable. No filling defects to suggest DVT on grayscale or color Doppler imaging. Doppler waveforms show normal direction of venous flow, normal respiratory plasticity and response to augmentation. Limited views of the contralateral common femoral vein are unremarkable. OTHER None. Limitations: none IMPRESSION: No right lower extremity DVT. Electronically Signed   By: Aliene Lloyd M.D.   On: 05/14/2024 07:34   PERIPHERAL VASCULAR CATHETERIZATION Result Date: 05/13/2024 See surgical note for result.     ECHO as above  TELEMETRY (personally reviewed): sinus rhythm rate 90s  EKG (personally reviewed): Sinus tachycardia rate of 120 nonspecific ST-T wave changes   DATA reviewed by me 05/14/24: last 24h vitals tele labs imaging I/O, hospitalist progress note  Principal Problem:   Acute on chronic combined systolic and diastolic CHF (congestive heart failure) (HCC) Active Problems:   COPD (chronic obstructive pulmonary disease) (HCC)   OSA on CPAP   Coronary artery disease   Hypotension   Severe aortic stenosis S/P TAVR (transcatheter aortic valve replacement)   Acute on chronic respiratory failure with hypoxia (HCC)   Anxiety and depression   ESRD on hemodialysis (HCC)   SIRS (systemic inflammatory response syndrome) (HCC)   Acute respiratory tract infection, possibly lower   Fluid overload    ASSESSMENT AND PLAN: [name] [name] is a 79 y.o. 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 Cardiology was consulted for further evaluation.   # Acute on chronic hypoxic  respiratory failure # ESRD on HD # Acute on chronic HFrEF # S/p TAVR Patient presented for worsening SOB after missing a dialysis session. BNP >35,000. Echo this admission with EF 25-30%, global hypokinesis, moderate to severe LV dilation with mild LVH, grade II diastolic dysfunction. Underwent HD 1/17 with 2.25 L removed and 1/19 with 2 L removed.  -Continue with HD as per nephrology, appreciate their recommendations.  -Continue IV lasix  40 mg twice daily, unclear if patient makes much urine he was not able to answer this when I asked him.  -Continue aspirin  81 mg daily and zetia  10 mg daily.  -Continue metoprolol  succinate 12.5 mg daily.  Unable to tolerate additional GDMT due to BP, renal function.  This patient's case was discussed and created with Dr. Florencio and he is in agreement.  Signed:  Danita Bloch, PA-C  05/14/2024, 8:48 AM [facility] Cardiology

## 2024-05-14 NOTE — Progress Notes (Signed)
 " [facility]  PROGRESS NOTE   Subjective:   Patient seen sitting up in chair Alert and oriented Denies pain States his breathing has improved after extra dialysis treatment  Objective:  Vital signs: Blood pressure (!) 115/51, pulse 97, temperature 97.6 F (36.4 C), resp. rate 18, height 5' 5 (1.651 m), weight 80.3 kg, SpO2 95%.  Intake/Output Summary (Last 24 hours) at 05/14/2024 1124 Last data filed at 05/14/2024 0534 Gross per 24 hour  Intake 340 ml  Output 2600 ml  Net -2260 ml   Filed Weights   05/13/24 1130 05/13/24 1236 05/14/24 0555  Weight: 83.4 kg 83.5 kg 80.3 kg     Physical Exam: General:  No acute distress  Head:  Normocephalic, atraumatic. Moist oral mucosal membranes  Eyes:  Anicteric  Lungs:   rhonchi, normal effort, cough  Heart:  S1S2 no rubs  Abdomen:   Soft, nontender, bowel sounds present  Extremities:  1+ peripheral edema.  Neurologic:  Awake, alert, following commands  Skin:  No lesions  Access: Rt AVF    Basic Metabolic Panel: Recent Labs  Lab 05/09/24 2229 05/11/24 0403 05/12/24 1050 05/13/24 0250 05/14/24 0424  NA 137 135 136 136 138  K 5.3* 4.1 3.8 3.9 3.8  CL 97* 95* 96* 94* 99  CO2 24 27 25 29 26   GLUCOSE 134* 87 71 80 121*  BUN 67* 56* 65* 42* 52*  CREATININE 5.75* 4.69* 4.90* 3.79* 4.25*  CALCIUM 8.4* 8.9 9.2 9.1 9.0  MG  --  2.2  --   --   --   PHOS  --  6.6* 6.5*  --   --    GFR: Estimated Creatinine Clearance: 13.5 mL/min (A) (by C-G formula based on SCr of 4.25 mg/dL (H)).  Liver Function Tests: Recent Labs  Lab 05/09/24 2229 05/12/24 1050  AST 28  --   ALT 9  --   ALKPHOS 80  --   BILITOT 0.3  --   PROT 6.3*  --   ALBUMIN  3.2* 3.3*   No results for input(s): LIPASE, AMYLASE in the last 168 hours. No results for input(s): AMMONIA in the last 168 hours.  CBC: Recent Labs  Lab 05/09/24 2033 05/11/24 0403 05/12/24 1050 05/13/24 0250 05/14/24 0424  WBC 10.7* 6.8 5.2 5.9 5.6   NEUTROABS 6.9  --   --   --   --   HGB 10.8* 10.0* 9.2* 10.0* 9.7*  HCT 35.1* 31.5* 27.7* 31.4* 30.1*  MCV 104.2* 99.1 97.2 98.1 98.4  PLT 167 130* 137* 177 144*     HbA1C: Hemoglobin A1C  Date/Time Value Ref Range Status  02/22/2022 12:00 AM 5.0  Final  07/26/2012 01:11 AM 6.2 4.2 - 6.3 % Final    Comment:    The American Diabetes Association recommends that a primary goal of therapy should be <7% and that physicians should reevaluate the treatment regimen in patients with HbA1c values consistently >8%.    Hgb A1c MFr Bld  Date/Time Value Ref Range Status  01/16/2024 10:20 AM 5.1 4.8 - 5.6 % Final    Comment:             Prediabetes: 5.7 - 6.4          Diabetes: >6.4          Glycemic control for adults with diabetes: <7.0   09/20/2023 10:03 AM 5.0 4.8 - 5.6 % Final    Comment:  Prediabetes: 5.7 - 6.4          Diabetes: >6.4          Glycemic control for adults with diabetes: <7.0     Urinalysis: No results for input(s): COLORURINE, LABSPEC, PHURINE, GLUCOSEU, HGBUR, BILIRUBINUR, KETONESUR, PROTEINUR, UROBILINOGEN, NITRITE, LEUKOCYTESUR in the last 72 hours.  Invalid input(s): APPERANCEUR    Imaging: US  Venous Img Lower Unilateral Right (DVT) Result Date: 05/14/2024 CLINICAL DATA:  Edema EXAM: RIGHT LOWER EXTREMITY VENOUS DOPPLER ULTRASOUND TECHNIQUE: Gray-scale sonography with compression, as well as color and duplex ultrasound, were performed to evaluate the deep venous system(s) from the level of the common femoral vein through the popliteal and proximal calf veins. COMPARISON:  None available FINDINGS: VENOUS Normal compressibility of the common femoral, femoral, and popliteal veins, as well as the visualized calf veins. Visualized portions of profunda femoral vein and great saphenous vein unremarkable. No filling defects to suggest DVT on grayscale or color Doppler imaging. Doppler waveforms show normal direction of venous flow,  normal respiratory plasticity and response to augmentation. Limited views of the contralateral common femoral vein are unremarkable. OTHER None. Limitations: none IMPRESSION: No right lower extremity DVT. Electronically Signed   By: Aliene Lloyd M.D.   On: 05/14/2024 07:34   PERIPHERAL VASCULAR CATHETERIZATION Result Date: 05/13/2024 See surgical note for result.     Medications:    ceFEPime  (MAXIPIME ) IV 1 g (05/13/24 2122)    aspirin  EC  81 mg Oral Daily   busPIRone   5 mg Oral QHS   calcitRIOL   0.25 mcg Oral Daily   Chlorhexidine  Gluconate Cloth  6 each Topical Q0600   citalopram   40 mg Oral Daily   doxycycline   100 mg Oral Q12H   ezetimibe   10 mg Oral Daily   furosemide   40 mg Intravenous BID   guaiFENesin   600 mg Oral BID   heparin   5,000 Units Subcutaneous Q8H   metoprolol  succinate  12.5 mg Oral Daily   pantoprazole   40 mg Oral Daily    Assessment/ Plan:     79 year old male with history of ESRD on hemodialysis Monday Wednesday Friday schedule, COPD on home oxygen , obstructive sleep apnea, diabetes, hypertension, history of TAVR now admitted with history of acute respiratory failure and was initially placed on BiPAP.  Patient was dialyzed last night.   #1: ESRD: Received extra dialysis treatment yesterday to remove additional fluid, UF 2L achieved. Will receive dialysis again today to maintain outpatient schedule.    #2: Anemia with chronic kidney disease:  Lab Results  Component Value Date   HGB 9.7 (L) 05/14/2024   Hgb 9.7, will continue to monitor and assess need for ESA.    #3: Respiratory failure/pneumonia: Remains on  doxycycline  and cefazolin     #4: CHF: ECHO completed on 05/10/24 shows EF 25-30% with mild LVH and Grade 2 dd. Continue furosemide  twice daily.  Patient need to be on fluid restriction.Continue fluid removal with dialysis    #5: Secondary hyperparathyroidism: Outpatient labs: PTH 261, phosphorus 6.1, and calcium 9.2 on 05/05/24.   Continue  calcitriol .   LOS: 4 [facility] 1/21/202611:24 AM  "

## 2024-05-14 NOTE — Progress Notes (Signed)
 Hemodialysis Note:  Received patient in bed to unit. Alert and oriented. Informed consent singed and in chart.  Treatment initiated: 1439 Treatment completed: 1745  Access used: Right AVF Access issues: None  300 ml saline bolus given during treatment due to leg cramps.Transported back to room, alert without acute distress. Report given to patient's RN.  Total UF removed: 1.5 Liters Medications given: None  Post HD weight: 78.8 Kg  [name]

## 2024-05-15 ENCOUNTER — Encounter: Payer: Self-pay | Admitting: Internal Medicine

## 2024-05-15 ENCOUNTER — Other Ambulatory Visit: Payer: Self-pay | Admitting: Family

## 2024-05-15 ENCOUNTER — Other Ambulatory Visit: Payer: Self-pay

## 2024-05-15 ENCOUNTER — Other Ambulatory Visit (HOSPITAL_COMMUNITY): Payer: Self-pay

## 2024-05-15 LAB — BASIC METABOLIC PANEL WITH GFR
Anion gap: 14 (ref 5–15)
BUN: 31 mg/dL — ABNORMAL HIGH (ref 8–23)
CO2: 26 mmol/L (ref 22–32)
Calcium: 9 mg/dL (ref 8.9–10.3)
Chloride: 96 mmol/L — ABNORMAL LOW (ref 98–111)
Creatinine, Ser: 3.15 mg/dL — ABNORMAL HIGH (ref 0.61–1.24)
GFR, Estimated: 19 mL/min — ABNORMAL LOW
Glucose, Bld: 130 mg/dL — ABNORMAL HIGH (ref 70–99)
Potassium: 3.9 mmol/L (ref 3.5–5.1)
Sodium: 136 mmol/L (ref 135–145)

## 2024-05-15 LAB — CBC
HCT: 32.2 % — ABNORMAL LOW (ref 39.0–52.0)
Hemoglobin: 10.4 g/dL — ABNORMAL LOW (ref 13.0–17.0)
MCH: 32.3 pg (ref 26.0–34.0)
MCHC: 32.3 g/dL (ref 30.0–36.0)
MCV: 100 fL (ref 80.0–100.0)
Platelets: 192 K/uL (ref 150–400)
RBC: 3.22 MIL/uL — ABNORMAL LOW (ref 4.22–5.81)
RDW: 15 % (ref 11.5–15.5)
WBC: 6.7 K/uL (ref 4.0–10.5)
nRBC: 0 % (ref 0.0–0.2)

## 2024-05-15 MED ORDER — METOPROLOL SUCCINATE ER 25 MG PO TB24
12.5000 mg | ORAL_TABLET | Freq: Every day | ORAL | 0 refills | Status: AC
  Filled 2024-05-15: qty 30, 60d supply, fill #0

## 2024-05-15 MED ORDER — MIDODRINE HCL 5 MG PO TABS
2.5000 mg | ORAL_TABLET | Freq: Three times a day (TID) | ORAL | 0 refills | Status: AC | PRN
  Filled 2024-05-15: qty 20, 14d supply, fill #0

## 2024-05-15 NOTE — Evaluation (Signed)
 Physical Therapy Evaluation Patient Details Name: [name] [name] MRN: [id number] DOB: March 05, 1946 Today's Date: 05/15/2024  History of Present Illness  79 y.o. male with medical history significant for ESRD on HD Mondays and Fridays only by choice, anemia of chronic renal disease, COPD with chronic hypoxic respiratory failure on 2-3 L, OSA on CPAP, CAD, severe aortic stenosis s/p TAVR (2018) type 2 diabetes, pulmonary hypertension, being admitted with  to acute pulmonary edema and respiratory distress related to missed dialysis, requiring BiPAP as well as SIRS/possible sepsis secondary to acute respiratory tract infection.  Clinical Impression  Pt did very well with all aspects of mobility, balance, strength, ambulation, etc.  However his O2 was a significant limiter.  He reports that until a few months ago he was ambulatory in the community w/o AD on 3L, recently has had less activity tolerance and this held true today. At rest pre ambulation pt's SpO2 was in the high 80s/low 90s on 5L and he had significant drop (down to 79%) with SOB during moderate bout of ambulation (~64ft). He had no LOBs or safety issues but fatigue was clearly an issue.  Pt is not at his baseline activity tolerance/endurance and will benefit from continued PT to address these issues.        If plan is discharge home, recommend the following: A little help with walking and/or transfers;Assist for transportation   Can travel by private vehicle        Equipment Recommendations None recommended by PT  Recommendations for Other Services       Functional Status Assessment Patient has had a recent decline in their functional status and demonstrates the ability to make significant improvements in function in a reasonable and predictable amount of time.     Precautions / Restrictions Precautions Precautions:  (mod fall) Restrictions Weight Bearing Restrictions Per Provider Order: No      Mobility  Bed  Mobility Overal bed mobility: Independent                  Transfers Overall transfer level: Modified independent Equipment used: None               General transfer comment: able to rise w/o direct assist, UE use that was not excessive, good confidence once standing    Ambulation/Gait Ambulation/Gait assistance: Supervision Gait Distance (Feet): 75 Feet Assistive device: None (6L O2)         General Gait Details: Pt able to ambulate w/o AD with confidence and safety, however he did fatigue with the relatively modest effort - SpO2 dropped to 79% on 6L and he did experience DOE/SOB with the effort.  Stairs            Wheelchair Mobility     Tilt Bed    Modified Rankin (Stroke Patients Only)       Balance Overall balance assessment: Modified Independent                                           Pertinent Vitals/Pain Pain Assessment Pain Assessment: No/denies pain    Home Living Family/patient expects to be discharged to:: Private residence Living Arrangements: Spouse/significant other Available Help at Discharge: Available 24 hours/day   Home Access: Level entry       Home Layout: One level Home Equipment: None      Prior Function Prior Level of Function :  Independent/Modified Independent             Mobility Comments: Pt reports that up until a few months ago he was able to ambulate in the community w/o AD (did need 2-3L O2) but recently rarely out of the home or doing longer bouts of ambulation 2/2 SOB ADLs Comments: wife takes care of all shopping, errands, etc but he can dress himself, toilet, etc     Extremity/Trunk Assessment   Upper Extremity Assessment Upper Extremity Assessment: Overall WFL for tasks assessed    Lower Extremity Assessment Lower Extremity Assessment: Overall WFL for tasks assessed       Communication   Communication Communication: No apparent difficulties    Cognition Arousal:  Alert Behavior During Therapy: WFL for tasks assessed/performed   PT - Cognitive impairments: No apparent impairments                         Following commands: Intact       Cueing       General Comments General comments (skin integrity, edema, etc.): Pt moved very well but much quicker to fatigue than he is previously used to, SpO2 on 5L at rest was 88-91%, dropped to 79% with modest ambulation effort - slow to return to high 80s in sitting post ambulation.    Exercises     Assessment/Plan    PT Assessment Patient needs continued PT services  PT Problem List Decreased activity tolerance;Cardiopulmonary status limiting activity       PT Treatment Interventions Gait training;Functional mobility training;Therapeutic activities;Therapeutic exercise;Balance training;Patient/family education    PT Goals (Current goals can be found in the Care Plan section)  Acute Rehab PT Goals Patient Stated Goal: Go home PT Goal Formulation: With patient Time For Goal Achievement: 05/28/24 Potential to Achieve Goals: Good    Frequency Min 1X/week     Co-evaluation               AM-PAC PT 6 Clicks Mobility  Outcome Measure Help needed turning from your back to your side while in a flat bed without using bedrails?: None Help needed moving from lying on your back to sitting on the side of a flat bed without using bedrails?: None Help needed moving to and from a bed to a chair (including a wheelchair)?: None Help needed standing up from a chair using your arms (e.g., wheelchair or bedside chair)?: None Help needed to walk in hospital room?: None Help needed climbing 3-5 steps with a railing? : None 6 Click Score: 24    End of Session Equipment Utilized During Treatment: Gait belt Activity Tolerance: Patient tolerated treatment well Patient left: with call bell/phone within reach Nurse Communication: Mobility status (O2 during activity) PT Visit Diagnosis: Difficulty  in walking, not elsewhere classified (R26.2)    Time: 9164-9145 PT Time Calculation (min) (ACUTE ONLY): 19 min   Charges:   PT Evaluation $PT Eval Low Complexity: 1 Low   PT General Charges $$ ACUTE PT VISIT: 1 Visit         Carmin JONELLE Deed, DPT 05/15/2024, 10:11 AM

## 2024-05-15 NOTE — Discharge Summary (Signed)
 " Physician Discharge Summary   Patient: [name] [name] MRN: [id number] DOB: April 29, 1945  Admit date:     05/09/2024  Discharge date: 05/15/24  Discharge Physician: Murvin Mana   PCP: Orlean Alan HERO, FNP   Recommendations at discharge:   Follow-up with PCP in 1 week. Please refer to palliative care. Follow-up with cardiology as scheduled. Follow-up with nephrology for continued dialysis.  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 *  Hospital Course: [name] [name] is a 79 y.o. 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  History provided by wife at bedside.  Patient 's last session was Monday Jan 12. He missed his Friday dialysis session due to feeling unwell with fever cough and malaise.  Later in the day he went into acute respiratory distress.  EMS brought him in on CPAP. In the ED, he was febrile to 100.8 heart rate 96 and tachypneic to the high 20s with O2 sat of 100% on CPAP.  BP  was initially normal at 115/67, trending down to as low as 91/44 during workup in the ED. VBG on BiPAP with pH 7.25 and pCO2 58. Labs otherwise notable for WBC 10.7 with lactic acid 1.8 and negative respiratory viral panel. Hemoglobin at baseline at 10.8. CMP with results in keeping with dialysis status.  Mild hyperkalemia 5.3 Troponin 68 and proBNP> 35,000 Chest x-ray consistent with pulmonary edema.   Patient was taken for urgent dialysis.   Patient was started on cefepime  and doxycycline  for possible pneumonia   1/21: Still feeling short of breath, going for an extra session of dialysis.  Nephrology is  going to increase the frequency of dialysis from twice weekly to 3 times a week.   Assessment and Plan: # Acute on chronic combined systolic and diastolic CHF (congestive heart failure) # Severe aortic valve stenosis, severe MR, moderate TR # Acute on chronic respiratory failure with hypoxia # Volume overload secondary to missed dialysis # ESRD on hemodialysis M&F Patient was hypotensive that is why Lasix  was not given on admission  Nephrology was consulted, patient got hemodialysis done. Patient was requiring BiPAP for the initial 2 days, has transitioned to oxygen  nasal cannula. TTE LVEF 25 to 30%, grade 2 DD, severe TR, moderate MR, severe AAS 1/17 started Lasix  40 mg IV twice daily Patient is dialyzed per nephrology, also followed by cardiology.  Due to low blood pressure, only was able to start a lower dose metoprolol  in addition to midodrine .   # Hypotension Initially required midodrine  at a higher dose, at this point, reduced to  2.5 mg 3 times a day.  This is secondary to poor heart function.   # Acute respiratory tract infection,  Sepsis secondary to pneumonia Sepsis criteria include fever, tachycardia, tachypnea with leukocytosis, likely secondary to pneumonia Continue empiric treatment with cefepime  and doxycycline  for possible lower respiratory tract infection-will  complete a 5-day course, to be completed by today.    ESRD on hemodialysis  AV fistula malfunction with stenosis. 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 Reviewed echocardiogram performed in 2024, 2025 and 05/10/2024, ejection fraction has been deteriorating rapidly.  Despite TAVR, patient aortic stenosis has worsened to severe, with area of valve at 0.57. Patient is unlikely to be a candidate for additional valve replacement.  Patient will be followed with cardiology in the near future decide on that.  However, given patient heart condition and other medical conditions, patient long-term prognosis very poor, life expectancy probably less than 1 year.  This has been discussed with patient son.  At this point, patient be referred to palliative care.   OSA on CPAP Resume home CPAP.   COPD (chronic obstructive pulmonary disease) (HCC) Acute exacerbation not suspected at this time Continue home inhalers   Coronary artery disease Continue home aspirin  and ezetimibe . Restarted metoprolol  at 12.5 mg daily per cardiology.   Anxiety and depression Continue buspirone  and citalopram    Overweight body mass index is 29.39 kg/m.  Diet and excise if possible.       Consultants: Cardiology and nephrology. Procedures performed: Dialysis, AV fistula stent placement. Disposition: Home health Diet recommendation:   Discharge Diet Orders (From admission, onward)     Start     Ordered   05/15/24 0000  Diet renal with fluid restriction        05/15/24 1041           Renal diet DISCHARGE MEDICATION: Allergies as of 05/15/2024       Reactions   Nsaids Other (See Comments)   Avoid due to kidney disease    Statins Other (See Comments)   Myalgias   Nexletol [bempedoic Acid] Diarrhea   fatigue   Pedi-pre Tape Spray [wound Dressing Adhesive]    Roflumilast     Silver Other (See Comments)   Tegaderm High Gelling Alginate [wound Dressings] Rash        Medication List     TAKE these medications    acetaminophen  650 MG CR tablet Commonly known as: TYLENOL  Take 1,300 mg by mouth every 8 (eight) hours as needed for pain.   albuterol  108 (90 Base) MCG/ACT inhaler Commonly known as: VENTOLIN  HFA Inhale 2 puffs into the lungs every 6 (six) hours as needed for wheezing or shortness of breath.   aspirin  EC 81 MG tablet Take 81 mg by mouth daily. Swallow whole.   busPIRone  5 MG tablet Commonly known as: BUSPAR  Take 1 tablet (5 mg total) by mouth at bedtime.   calcitRIOL  0.25 MCG capsule Commonly known as: ROCALTROL  Take 0.25 mcg by mouth daily.  citalopram  40 MG tablet Commonly known as: CELEXA  Take 1 tablet (40 mg total) by mouth daily.   DropSafe Alcohol Prep 70 % Pads Apply 1 each topically as needed (With repatha and blood sugar checks.).   ezetimibe  10 MG tablet Commonly known as: ZETIA  TAKE 1 TABLET BY MOUTH EVERYDAY AT BEDTIME   febuxostat  40 MG tablet Commonly known as: ULORIC  Take 40 mg by mouth at bedtime.   fexofenadine 180 MG tablet Commonly known as: ALLEGRA Take 180 mg by mouth daily as needed for allergies or rhinitis.   fluorouracil  5 % cream Commonly known as: EFUDEX  Apply twice a day to rough areas on  temples, cheeks, forehead, top of scalp/head, crown of scalp until reaction occurs   MAGNESIUM  OXIDE 400 PO Take by mouth Nightly.   metolazone 5 MG  tablet Commonly known as: ZAROXOLYN Take 5 mg by mouth once a week.   metoprolol  succinate 25 MG 24 hr tablet Commonly known as: TOPROL -XL Take 0.5 tablets (12.5 mg total) by mouth daily. Start taking on: May 16, 2024 What changed:  medication strength how much to take   midodrine  5 MG tablet Commonly known as: PROAMATINE  Take 0.5 tablets (2.5 mg total) by mouth 3 (three) times daily as needed (SBP <100).   Ohtuvayre  3 MG/2.5ML Susp Generic drug: Ensifentrine  Inhale 3 mg into the lungs.   omeprazole  40 MG capsule Commonly known as: PRILOSEC Take 1 capsule (40 mg total) by mouth daily.   OXYGEN  Inhale 2-3 L into the lungs See admin instructions. Used as needed throughout the day and continuous at bedtime   pantoprazole  40 MG tablet Commonly known as: PROTONIX    PreserVision AREDS Caps Take 1 capsule by mouth 2 (two) times daily.   Repatha SureClick 140 MG/ML Soaj Generic drug: Evolocumab INJECT CONTENT OF 1 CARTRIDGE UNDER THE SKIN EVERY OTHER WEEK   SUPER B COMPLEX/C PO Take 1 tablet by mouth daily.   torsemide  20 MG tablet Commonly known as: DEMADEX  Take 20 mg by mouth daily.   Trelegy Ellipta  100-62.5-25 MCG/ACT Aepb Generic drug: Fluticasone -Umeclidin-Vilant Inhale 1 Inhalation into the lungs daily at 6 (six) AM.   vitamin C  1000 MG tablet Take 1,000 mg by mouth at bedtime.        Follow-up Information     Fernand Denyse LABOR, MD. Go in 1 week(s).   Specialty: Cardiology Contact information: [address] [place] [address] [phone]         Jama, Cordella MATSU, MD Follow up in 3 week(s).   Specialties: Vascular Surgery, Cardiology, Radiology, Vascular Surgery Why: follow up after procedure with an [facility] Contact information: [address] [place] [address] [phone]         Orlean Alan HERO, FNP Follow up in 1 week(s).   Specialty: Family Medicine Contact information: [address] [place]  [address] [phone]                Discharge Exam: Filed Weights   05/14/24 1745 05/15/24 0500 05/15/24 0849  Weight: 78.8 kg 81.8 kg 78.2 kg   General exam: Appears calm and comfortable  Respiratory system: Clear to auscultation. Respiratory effort normal. Cardiovascular system: S1 & S2 heard, RRR.2/6 SM at RUSB Gastrointestinal system: Abdomen is nondistended, soft and nontender. No organomegaly or masses felt. Normal bowel sounds heard. Central nervous system: Alert and oriented x3. No focal neurological deficits. Extremities: 2+ leg edema Skin: No rashes, lesions or ulcers Psychiatry: Judgement and insight appear normal. Mood &  affect appropriate.    Condition at discharge: fair  The results of significant diagnostics from this hospitalization (including imaging, microbiology, ancillary and laboratory) are listed below for reference.   Imaging Studies: US  Venous Img Lower Unilateral Right (DVT) Result Date: 05/14/2024 CLINICAL DATA:  Edema EXAM: RIGHT LOWER EXTREMITY VENOUS DOPPLER ULTRASOUND TECHNIQUE: Gray-scale sonography with compression, as well as color and duplex ultrasound, were performed to evaluate the deep venous system(s) from the level of the common femoral vein through the popliteal and proximal calf veins. COMPARISON:  None available FINDINGS: VENOUS Normal compressibility of the common femoral, femoral, and popliteal veins, as well as the visualized calf veins. Visualized portions of profunda femoral vein and great saphenous vein unremarkable. No filling defects to suggest DVT on grayscale or color Doppler imaging. Doppler waveforms show normal direction of venous flow, normal respiratory plasticity and response to augmentation. Limited views of the contralateral common femoral vein are unremarkable. OTHER None. Limitations: none IMPRESSION: No right lower extremity DVT. Electronically Signed   By: Aliene Lloyd M.D.   On: 05/14/2024 07:34   PERIPHERAL VASCULAR  CATHETERIZATION Result Date: 05/13/2024 See surgical note for result.  ECHOCARDIOGRAM COMPLETE Result Date: 05/10/2024    ECHOCARDIOGRAM REPORT   Patient Name:   HAYLEN SHELNUTT Date of Exam: 05/10/2024 Medical Rec #:  [id number]            Height:       65.0 in Accession #:    [id number]           Weight:       211.6 lb Date of Birth:  [birth year]           BSA:          2.026 m Patient Age:    78 years             BP:           113/69 mmHg Patient Gender: M                    HR:           78 bpm. Exam Location:  [facility] Procedure: 2D Echo, Cardiac Doppler, Color Doppler and Strain Analysis (Both            Spectral and Color Flow Doppler were utilized during procedure). Indications:     CHF-Acute Diastolic I50.31  History:         Patient has prior history of Echocardiogram examinations. CAD.  Sonographer:     Bari Roar Referring Phys:  [id number] DELAYNE LULLA SOLIAN Diagnosing Phys: Cara JONETTA Lovelace MD  Sonographer Comments: Global longitudinal strain was attempted. IMPRESSIONS  1. Left ventricular ejection fraction, by estimation, is 25 to 30%. The left ventricle has severely decreased function. The left ventricle demonstrates global hypokinesis. The left ventricular internal cavity size was moderately to severely dilated. There is mild concentric left ventricular hypertrophy. Left ventricular diastolic parameters are consistent with Grade II diastolic dysfunction (pseudonormalization). The average left ventricular global longitudinal strain is 7.5 %. The global longitudinal strain is abnormal.  2. Right ventricular systolic function is mildly reduced. The right ventricular size is mildly enlarged.  3. Left atrial size was mild to moderately dilated.  4. Right atrial size was moderately dilated.  5. The mitral valve is abnormal. Moderate mitral valve regurgitation.  6. Tricuspid valve regurgitation is severe.  7. The aortic valve is calcified. There is severe calcifcation of the aortic valve. There is moderate  thickening  of the aortic valve. Aortic valve regurgitation is mild. Severe aortic valve stenosis. FINDINGS  Left Ventricle: Left ventricular ejection fraction, by estimation, is 25 to 30%. The left ventricle has severely decreased function. The left ventricle demonstrates global hypokinesis. The average left ventricular global longitudinal strain is 7.5 %. Strain was performed and the global longitudinal strain is abnormal. The left ventricular internal cavity size was moderately to severely dilated. There is mild concentric left ventricular hypertrophy. Left ventricular diastolic parameters are consistent  with Grade II diastolic dysfunction (pseudonormalization). Right Ventricle: The right ventricular size is mildly enlarged. No increase in right ventricular wall thickness. Right ventricular systolic function is mildly reduced. Left Atrium: Left atrial size was mild to moderately dilated. Right Atrium: Right atrial size was moderately dilated. Pericardium: The pericardium was not well visualized. Mitral Valve: The mitral valve is abnormal. Moderate mitral valve regurgitation. MV peak gradient, 18.0 mmHg. The mean mitral valve gradient is 8.0 mmHg. Tricuspid Valve: The tricuspid valve is grossly normal. Tricuspid valve regurgitation is severe. Aortic Valve: The aortic valve is calcified. There is severe calcifcation of the aortic valve. There is moderate thickening of the aortic valve. There is moderate to severe aortic valve annular calcification. Aortic valve regurgitation is mild. Severe aortic stenosis is present. Aortic valve mean gradient measures 11.0 mmHg. Aortic valve peak gradient measures 18.7 mmHg. Aortic valve area, by VTI measures 0.59 cm. Pulmonic Valve: The pulmonic valve was not well visualized. Pulmonic valve regurgitation is not visualized. Aorta: The aortic root was not well visualized. IAS/Shunts: No atrial level shunt detected by color flow Doppler. Additional Comments: 3D was performed not  requiring image post processing on an independent workstation and was indeterminate.  LEFT VENTRICLE PLAX 2D LVIDd:         5.60 cm      Diastology LVIDs:         4.90 cm      LV e' medial:    4.33 cm/s LV PW:         1.30 cm      LV E/e' medial:  42.7 LV IVS:        1.20 cm      LV e' lateral:   3.92 cm/s LVOT diam:     1.60 cm      LV E/e' lateral: 47.2 LV SV:         25 LV SV Index:   13           2D Longitudinal Strain LVOT Area:     2.01 cm     2D Strain GLS (A4C):   7.0 %                             2D Strain GLS (A3C):   8.3 %                             2D Strain GLS (A2C):   7.4 % LV Volumes (MOD)            2D Strain GLS Avg:     7.5 % LV vol d, MOD A2C: 156.0 ml LV vol d, MOD A4C: 128.0 ml LV vol s, MOD A2C: 111.0 ml LV vol s, MOD A4C: 91.4 ml LV SV MOD A2C:     45.0 ml LV SV MOD A4C:     128.0 ml LV SV MOD BP:  40.2 ml RIGHT VENTRICLE RV Basal diam:  4.20 cm RV Mid diam:    3.60 cm RV S prime:     8.22 cm/s TAPSE (M-mode): 1.7 cm LEFT ATRIUM              Index        RIGHT ATRIUM           Index LA diam:        4.70 cm  2.32 cm/m   RA Area:     21.50 cm LA Vol (A2C):   101.0 ml 49.85 ml/m  RA Volume:   66.70 ml  32.92 ml/m LA Vol (A4C):   93.6 ml  46.19 ml/m LA Biplane Vol: 98.0 ml  48.36 ml/m  AORTIC VALVE                     PULMONIC VALVE AV Area (Vmax):    0.56 cm      PV Vmax:          1.24 m/s AV Area (Vmean):   0.55 cm      PV Peak grad:     6.2 mmHg AV Area (VTI):     0.59 cm      PR End Diast Vel: 4.93 msec AV Vmax:           216.00 cm/s   RVOT Peak grad:   1 mmHg AV Vmean:          155.000 cm/s AV VTI:            0.428 m AV Peak Grad:      18.7 mmHg AV Mean Grad:      11.0 mmHg LVOT Vmax:         60.00 cm/s LVOT Vmean:        42.200 cm/s LVOT VTI:          0.126 m LVOT/AV VTI ratio: 0.29  AORTA Ao Root diam: 2.90 cm Ao Asc diam:  3.10 cm MITRAL VALVE                TRICUSPID VALVE MV Area (PHT): 3.17 cm     TR Peak grad:   48.7 mmHg MV Area VTI:   0.47 cm     TR Vmax:         349.00 cm/s MV Peak grad:  18.0 mmHg MV Mean grad:  8.0 mmHg     SHUNTS MV Vmax:       2.12 m/s     Systemic VTI:  0.13 m MV Vmean:      129.0 cm/s   Systemic Diam: 1.60 cm MV Decel Time: 239 msec MR Peak grad: 93.7 mmHg MR Vmax:      484.00 cm/s MV E velocity: 185.00 cm/s MV A velocity: 152.00 cm/s MV E/A ratio:  1.22 MV A Prime:    7.8 cm/s Cara JONETTA Lovelace MD Electronically signed by Cara JONETTA Lovelace MD Signature Date/Time: 05/10/2024/2:29:31 PM    Final    DG Chest Portable 1 View Result Date: 05/09/2024 CLINICAL DATA:  Short of breath EXAM: PORTABLE CHEST 1 VIEW COMPARISON:  12/04/2023, 09/06/2022 FINDINGS: Cardiomegaly with vascular congestion and diffuse interstitial opacities suspected to represent pulmonary edema. Aortic atherosclerosis. No pleural effusion or pneumothorax. IMPRESSION: Cardiomegaly with vascular congestion and diffuse interstitial opacities suspected to represent pulmonary edema. Electronically Signed   By: Luke Bun M.D.   On: 05/09/2024 20:59   VAS US  DUPLEX DIALYSIS ACCESS (AVF, AVG) Result Date:  04/25/2024 DIALYSIS ACCESS Patient Name:  ELIZER BOSTIC  Date of Exam:   04/23/2024 Medical Rec #: [id number]                Accession #:    [id number] Date of Birth: June 01, 1945               Patient Gender: M Patient Age:   62 years Exam Location:  [facility] Procedure:      VAS US  DUPLEX DIALYSIS ACCESS (AVF, AVG) Referring Phys: CORDELLA SHAWL --------------------------------------------------------------------------------  Reason for Exam: Routine follow up. Access Site: Right Upper Extremity. Access Type: Brachial-cephalic AVF. History: 06/04/2023: Right Brachial Cephalic AVF created. Comparison Study: 12/2023 Performing Technologist: Jerel Croak RVT  Examination Guidelines: A complete evaluation includes B-mode imaging, spectral Doppler, color Doppler, and power Doppler as needed of all accessible portions of each vessel. Unilateral testing is considered  an integral part of a complete examination. Limited examinations for reoccurring indications may be performed as noted.  Findings: +--------------------+----------+-----------------+--------+ AVF                 PSV (cm/s)Flow Vol (mL/min)Comments +--------------------+----------+-----------------+--------+ Native artery inflow   173          1832                +--------------------+----------+-----------------+--------+ AVF Anastomosis        268                              +--------------------+----------+-----------------+--------+  +-------------+----------+-------------+----------+------------------+ OUTFLOW VEIN PSV (cm/s)Diameter (cm)Depth (cm)     Describe      +-------------+----------+-------------+----------+------------------+ Axillary vein   158                                              +-------------+----------+-------------+----------+------------------+ Confluence      144                                              +-------------+----------+-------------+----------+------------------+ Prox UA         129                                              +-------------+----------+-------------+----------+------------------+ Mid UA          620        0.32               change in Diameter +-------------+----------+-------------+----------+------------------+ Dist UA         252        0.85                                  +-------------+----------+-------------+----------+------------------+  +-------------+-------------+----------+---------+---------+-------------------+              Diameter (cm)Depth (cm)Branching   PSV       Flow Volume                                                   (  cm/s)       (ml/min)       +-------------+-------------+----------+---------+---------+-------------------+ Rt Rad Art                                      43                        Dis                                                                        +-------------+-------------+----------+---------+---------+-------------------+  Summary: Patent AVF with moderate stenosis in mid UA AVF.  *See table(s) above for measurements and observations.  Diagnosing physician: Cordella Shawl MD Electronically signed by Cordella Shawl MD on 04/25/2024 at 12:24:56 PM.   --------------------------------------------------------------------------------   Final     Microbiology: Results for orders placed or performed during the hospital encounter of 05/09/24  Resp panel by RT-PCR (RSV, Flu A&B, Covid) Anterior Nasal Swab     Status: None   Collection Time: 05/09/24  8:32 PM   Specimen: Anterior Nasal Swab  Result Value Ref Range Status   SARS Coronavirus 2 by RT PCR NEGATIVE NEGATIVE Final    Comment: (NOTE) SARS-CoV-2 target nucleic acids are NOT DETECTED.  The SARS-CoV-2 RNA is generally detectable in upper respiratory specimens during the acute phase of infection. The lowest concentration of SARS-CoV-2 viral copies this assay can detect is 138 copies/mL. A negative result does not preclude SARS-Cov-2 infection and should not be used as the sole basis for treatment or other patient management decisions. A negative result may occur with  improper specimen collection/handling, submission of specimen other than nasopharyngeal swab, presence of viral mutation(s) within the areas targeted by this assay, and inadequate number of viral copies(<138 copies/mL). A negative result must be combined with clinical observations, patient history, and epidemiological information. The expected result is Negative.  Fact Sheet for Patients:  bloggercourse.com  Fact Sheet for Healthcare Providers:  seriousbroker.it  This test is no t yet approved or cleared by the [place]  FDA and  has been authorized for detection and/or diagnosis of SARS-CoV-2 by FDA under an Emergency Use Authorization  (EUA). This EUA will remain  in effect (meaning this test can be used) for the duration of the COVID-19 declaration under Section 564(b)(1) of the Act, 21 U.S.C.section 360bbb-3(b)(1), unless the authorization is terminated  or revoked sooner.       Influenza A by PCR NEGATIVE NEGATIVE Final   Influenza B by PCR NEGATIVE NEGATIVE Final    Comment: (NOTE) The Xpert Xpress SARS-CoV-2/FLU/RSV plus assay is intended as an aid in the diagnosis of influenza from Nasopharyngeal swab specimens and should not be used as a sole basis for treatment. Nasal washings and aspirates are unacceptable for Xpert Xpress SARS-CoV-2/FLU/RSV testing.  Fact Sheet for Patients: bloggercourse.com  Fact Sheet for Healthcare Providers: seriousbroker.it  This test is not yet approved or cleared by the [place]  FDA and has been authorized for detection and/or diagnosis of SARS-CoV-2 by FDA under an Emergency Use Authorization (EUA). This EUA will remain in effect (meaning this test can be used) for the duration of the  COVID-19 declaration under Section 564(b)(1) of the Act, 21 U.S.C. section 360bbb-3(b)(1), unless the authorization is terminated or revoked.     Resp Syncytial Virus by PCR NEGATIVE NEGATIVE Final    Comment: (NOTE) Fact Sheet for Patients: bloggercourse.com  Fact Sheet for Healthcare Providers: seriousbroker.it  This test is not yet approved or cleared by the [place]  FDA and has been authorized for detection and/or diagnosis of SARS-CoV-2 by FDA under an Emergency Use Authorization (EUA). This EUA will remain in effect (meaning this test can be used) for the duration of the COVID-19 declaration under Section 564(b)(1) of the Act, 21 U.S.C. section 360bbb-3(b)(1), unless the authorization is terminated or revoked.  Performed at [facility], [address]  [place], [place] [address]   Culture, blood (routine x 2)     Status: None   Collection Time: 05/09/24  8:33 PM   Specimen: BLOOD  Result Value Ref Range Status   Specimen Description BLOOD BLOOD RIGHT HAND  Final   Special Requests   Final    BOTTLES DRAWN AEROBIC AND ANAEROBIC Blood Culture results may not be optimal due to an inadequate volume of blood received in culture bottles   Culture   Final    NO GROWTH 5 DAYS Performed at [facility], [address] [place], [place] [address]    Report Status 05/14/2024 FINAL  Final  Culture, blood (routine x 2)     Status: None   Collection Time: 05/09/24  8:33 PM   Specimen: BLOOD  Result Value Ref Range Status   Specimen Description BLOOD BLOOD LEFT HAND  Final   Special Requests   Final    BOTTLES DRAWN AEROBIC AND ANAEROBIC Blood Culture results may not be optimal due to an inadequate volume of blood received in culture bottles   Culture   Final    NO GROWTH 5 DAYS Performed at [facility], [address] [place], [place] [address]    Report Status 05/14/2024 FINAL  Final    Labs: CBC: Recent Labs  Lab 05/09/24 2033 05/11/24 0403 05/12/24 1050 05/13/24 0250 05/14/24 0424 05/15/24 0333  WBC 10.7* 6.8 5.2 5.9 5.6 6.7  NEUTROABS 6.9  --   --   --   --   --   HGB 10.8* 10.0* 9.2* 10.0* 9.7* 10.4*  HCT 35.1* 31.5* 27.7* 31.4* 30.1* 32.2*  MCV 104.2* 99.1 97.2 98.1 98.4 100.0  PLT 167 130* 137* 177 144* 192   Basic Metabolic Panel: Recent Labs  Lab 05/11/24 0403 05/12/24 1050 05/13/24 0250 05/14/24 0424 05/15/24 0333  NA 135 136 136 138 136  K 4.1 3.8 3.9 3.8 3.9  CL 95* 96* 94* 99 96*  CO2 27 25 29 26 26   GLUCOSE 87 71 80 121* 130*  BUN 56* 65* 42* 52* 31*  CREATININE 4.69* 4.90* 3.79* 4.25* 3.15*  CALCIUM 8.9 9.2 9.1 9.0 9.0  MG 2.2  --   --   --   --   PHOS 6.6* 6.5*  --   --   --    Liver Function Tests: Recent Labs  Lab 05/09/24 2229 05/12/24 1050  AST 28  --   ALT 9  --    ALKPHOS 80  --   BILITOT 0.3  --   PROT 6.3*  --   ALBUMIN  3.2* 3.3*   CBG: Recent Labs  Lab 05/10/24 2128 05/11/24 2203 05/12/24 1128 05/12/24 1700  GLUCAP 152* 146* 81 134*    Discharge  time spent: 35 minutes.  Signed: Murvin Mana, MD [facility] 05/15/2024 "

## 2024-05-15 NOTE — Progress Notes (Signed)
 " [facility]  PROGRESS NOTE   Subjective:   Patient seen sitting at side of bed States he feels well Remains on 4L [place]   Objective:  Vital signs: Blood pressure (!) 120/55, pulse 86, temperature 97.9 F (36.6 C), resp. rate 20, height 5' 5 (1.651 m), weight 78.2 kg, SpO2 100%.  Intake/Output Summary (Last 24 hours) at 05/15/2024 1230 Last data filed at 05/15/2024 1032 Gross per 24 hour  Intake 240 ml  Output 1600 ml  Net -1360 ml   Filed Weights   05/14/24 1745 05/15/24 0500 05/15/24 0849  Weight: 78.8 kg 81.8 kg 78.2 kg     Physical Exam: General:  No acute distress  Head:  Normocephalic, atraumatic. Moist oral mucosal membranes  Eyes:  Anicteric  Lungs:   rhonchi, normal effort, cough  Heart:  S1S2 no rubs  Abdomen:   Soft, nontender, bowel sounds present  Extremities:  1+ peripheral edema.  Neurologic:  Awake, alert, following commands  Skin:  No lesions  Access: Rt AVF    Basic Metabolic Panel: Recent Labs  Lab 05/11/24 0403 05/12/24 1050 05/13/24 0250 05/14/24 0424 05/15/24 0333  NA 135 136 136 138 136  K 4.1 3.8 3.9 3.8 3.9  CL 95* 96* 94* 99 96*  CO2 27 25 29 26 26   GLUCOSE 87 71 80 121* 130*  BUN 56* 65* 42* 52* 31*  CREATININE 4.69* 4.90* 3.79* 4.25* 3.15*  CALCIUM 8.9 9.2 9.1 9.0 9.0  MG 2.2  --   --   --   --   PHOS 6.6* 6.5*  --   --   --    GFR: Estimated Creatinine Clearance: 18 mL/min (A) (by C-G formula based on SCr of 3.15 mg/dL (H)).  Liver Function Tests: Recent Labs  Lab 05/09/24 2229 05/12/24 1050  AST 28  --   ALT 9  --   ALKPHOS 80  --   BILITOT 0.3  --   PROT 6.3*  --   ALBUMIN  3.2* 3.3*   No results for input(s): LIPASE, AMYLASE in the last 168 hours. No results for input(s): AMMONIA in the last 168 hours.  CBC: Recent Labs  Lab 05/09/24 2033 05/11/24 0403 05/12/24 1050 05/13/24 0250 05/14/24 0424 05/15/24 0333  WBC 10.7* 6.8 5.2 5.9 5.6 6.7  NEUTROABS 6.9  --   --   --   --   --   HGB  10.8* 10.0* 9.2* 10.0* 9.7* 10.4*  HCT 35.1* 31.5* 27.7* 31.4* 30.1* 32.2*  MCV 104.2* 99.1 97.2 98.1 98.4 100.0  PLT 167 130* 137* 177 144* 192     HbA1C: Hemoglobin A1C  Date/Time Value Ref Range Status  02/22/2022 12:00 AM 5.0  Final  07/26/2012 01:11 AM 6.2 4.2 - 6.3 % Final    Comment:    The American Diabetes Association recommends that a primary goal of therapy should be <7% and that physicians should reevaluate the treatment regimen in patients with HbA1c values consistently >8%.    Hgb A1c MFr Bld  Date/Time Value Ref Range Status  01/16/2024 10:20 AM 5.1 4.8 - 5.6 % Final    Comment:             Prediabetes: 5.7 - 6.4          Diabetes: >6.4          Glycemic control for adults with diabetes: <7.0   09/20/2023 10:03 AM 5.0 4.8 - 5.6 % Final    Comment:  Prediabetes: 5.7 - 6.4          Diabetes: >6.4          Glycemic control for adults with diabetes: <7.0     Urinalysis: No results for input(s): COLORURINE, LABSPEC, PHURINE, GLUCOSEU, HGBUR, BILIRUBINUR, KETONESUR, PROTEINUR, UROBILINOGEN, NITRITE, LEUKOCYTESUR in the last 72 hours.  Invalid input(s): APPERANCEUR    Imaging: US  Venous Img Lower Unilateral Right (DVT) Result Date: 05/14/2024 CLINICAL DATA:  Edema EXAM: RIGHT LOWER EXTREMITY VENOUS DOPPLER ULTRASOUND TECHNIQUE: Gray-scale sonography with compression, as well as color and duplex ultrasound, were performed to evaluate the deep venous system(s) from the level of the common femoral vein through the popliteal and proximal calf veins. COMPARISON:  None available FINDINGS: VENOUS Normal compressibility of the common femoral, femoral, and popliteal veins, as well as the visualized calf veins. Visualized portions of profunda femoral vein and great saphenous vein unremarkable. No filling defects to suggest DVT on grayscale or color Doppler imaging. Doppler waveforms show normal direction of venous flow, normal respiratory  plasticity and response to augmentation. Limited views of the contralateral common femoral vein are unremarkable. OTHER None. Limitations: none IMPRESSION: No right lower extremity DVT. Electronically Signed   By: Aliene Lloyd M.D.   On: 05/14/2024 07:34   PERIPHERAL VASCULAR CATHETERIZATION Result Date: 05/13/2024 See surgical note for result.     Medications:    ceFEPime  (MAXIPIME ) IV 1 g (05/14/24 2111)    aspirin  EC  81 mg Oral Daily   busPIRone   5 mg Oral QHS   calcitRIOL   0.25 mcg Oral Daily   Chlorhexidine  Gluconate Cloth  6 each Topical Q0600   citalopram   40 mg Oral Daily   doxycycline   100 mg Oral Q12H   ezetimibe   10 mg Oral Daily   furosemide   40 mg Intravenous BID   guaiFENesin   600 mg Oral BID   heparin   5,000 Units Subcutaneous Q8H   metoprolol  succinate  12.5 mg Oral Daily   pantoprazole   40 mg Oral Daily    Assessment/ Plan:     79 year old male with history of ESRD on hemodialysis Monday Wednesday Friday schedule, COPD on home oxygen , obstructive sleep apnea, diabetes, hypertension, history of TAVR now admitted with history of acute respiratory failure and was initially placed on BiPAP.  Patient was dialyzed last night.   #1: ESRD: Received dialysis yesterday, UF 1.5L achieved. Next treatment scheduled for Friday. Will adjust standing weight, 78.5kg.    #2: Anemia with chronic kidney disease:  Lab Results  Component Value Date   HGB 10.4 (L) 05/15/2024   Hgb 10.4, will continue to monitor and assess need for ESA.    #3: Respiratory failure/pneumonia: Remains on  doxycycline  and cefazolin     #4: CHF: ECHO completed on 05/10/24 shows EF 25-30% with mild LVH and Grade 2 dd. Continue furosemide  twice daily.  Patient need to be on fluid restriction.Continue fluid removal with dialysis    #5: Secondary hyperparathyroidism: Outpatient labs: PTH 261, phosphorus 6.1, and calcium 9.2 on 05/05/24.   Continue calcitriol .   LOS: 5 [facility] 1/22/202612:30 PM  "

## 2024-05-15 NOTE — Progress Notes (Addendum)
 D/C order noted. Contacted [facility]      to be advised of pt and d/c today. Requested documents faxed to clinic for continuation of care.  Suzen Satchel Dialysis Navigator [phone]

## 2024-05-15 NOTE — Plan of Care (Signed)
" °  Problem: Education: Goal: Knowledge of General Education information will improve Description: Including pain rating scale, medication(s)/side effects and non-pharmacologic comfort measures Outcome: Adequate for Discharge   Problem: Health Behavior/Discharge Planning: Goal: Ability to manage health-related needs will improve Outcome: Adequate for Discharge   Problem: Clinical Measurements: Goal: Ability to maintain clinical measurements within normal limits will improve Outcome: Adequate for Discharge Goal: Will remain free from infection Outcome: Adequate for Discharge Goal: Diagnostic test results will improve Outcome: Adequate for Discharge Goal: Respiratory complications will improve Outcome: Adequate for Discharge Goal: Cardiovascular complication will be avoided Outcome: Adequate for Discharge   Problem: Activity: Goal: Risk for activity intolerance will decrease Outcome: Adequate for Discharge   Problem: Nutrition: Goal: Adequate nutrition will be maintained Outcome: Adequate for Discharge   Problem: Coping: Goal: Level of anxiety will decrease Outcome: Adequate for Discharge   Problem: Elimination: Goal: Will not experience complications related to bowel motility Outcome: Adequate for Discharge Goal: Will not experience complications related to urinary retention Outcome: Adequate for Discharge   Problem: Pain Managment: Goal: General experience of comfort will improve and/or be controlled Outcome: Adequate for Discharge   Problem: Safety: Goal: Ability to remain free from injury will improve Outcome: Adequate for Discharge   Problem: Skin Integrity: Goal: Risk for impaired skin integrity will decrease Outcome: Adequate for Discharge   Problem: Education: Goal: Ability to demonstrate management of disease process will improve Outcome: Adequate for Discharge Goal: Ability to verbalize understanding of medication therapies will improve Outcome: Adequate  for Discharge Goal: Individualized Educational Video(s) Outcome: Adequate for Discharge   Problem: Activity: Goal: Capacity to carry out activities will improve Outcome: Adequate for Discharge   Problem: Cardiac: Goal: Ability to achieve and maintain adequate cardiopulmonary perfusion will improve Outcome: Adequate for Discharge   Problem: Acute Rehab PT Goals(only PT should resolve) Goal: Pt Will Perform Standing Balance Or Pre-Gait Outcome: Adequate for Discharge Goal: Pt Will Ambulate Outcome: Adequate for Discharge   "

## 2024-05-15 NOTE — Evaluation (Signed)
 Occupational Therapy Evaluation Patient Details Name: [name] [name] MRN: [id number] DOB: Aug 14, 1945 Today's Date: 05/15/2024   History of Present Illness   79 y.o. male with medical history significant for ESRD on HD Mondays and Fridays only by choice, anemia of chronic renal disease, COPD with chronic hypoxic respiratory failure on 2-3 L, OSA on CPAP, CAD, severe aortic stenosis s/p TAVR (2018) type 2 diabetes, pulmonary hypertension, being admitted with  to acute pulmonary edema and respiratory distress related to missed dialysis, requiring BiPAP as well as SIRS/possible sepsis secondary to acute respiratory tract infection.     Clinical Impressions Patient lives at home with significant other and is on O2 at baseline. Pt demonstrates ability to ambulate in room on 4Ls via [place] to sink for grooming tasks in standing. Pt returning to sit on EOB with O2 saturation being 88% and quickly increasing into the 90% with pursed lip breathing. Pt endorses feeling like he is at his functional baseline. Wife will be home to assist if needed. Pt does not skilled acute OT intervention at this time. OT to sign off and pt agrees.       Functional Status Assessment   Patient has not had a recent decline in their functional status     Equipment Recommendations   None recommended by OT      Precautions/Restrictions   Precautions Precautions: None     Mobility Bed Mobility Overal bed mobility: Independent                  Transfers Overall transfer level: Modified independent Equipment used: None                      Balance Overall balance assessment: Modified Independent                                         ADL either performed or assessed with clinical judgement   ADL Overall ADL's : Modified independent                                       General ADL Comments: to ambulate to sink and stand to brush teeth      Vision Patient Visual Report: No change from baseline              Pertinent Vitals/Pain Pain Assessment Pain Assessment: No/denies pain     Extremity/Trunk Assessment Upper Extremity Assessment Upper Extremity Assessment: Overall WFL for tasks assessed   Lower Extremity Assessment Lower Extremity Assessment: Overall WFL for tasks assessed       Communication Communication Communication: No apparent difficulties   Cognition Arousal: Alert Behavior During Therapy: WFL for tasks assessed/performed Cognition: No apparent impairments                               Following commands: Intact                  Home Living Family/patient expects to be discharged to:: Private residence Living Arrangements: Spouse/significant other Available Help at Discharge: Available 24 hours/day Type of Home: House Home Access: Level entry     Home Layout: One level               Home  Equipment: None          Prior Functioning/Environment Prior Level of Function : Independent/Modified Independent             Mobility Comments: Pt reports that up until a few months ago he was able to ambulate in the community w/o AD (did need 2-3L O2) but recently rarely out of the home or doing longer bouts of ambulation 2/2 SOB ADLs Comments: wife takes care of all shopping, errands, etc but he can dress himself, toilet, etc            OT Goals(Current goals can be found in the care plan section)   Acute Rehab OT Goals Patient Stated Goal: to go home OT Goal Formulation: With patient Time For Goal Achievement: 05/29/24 Potential to Achieve Goals: Fair   AM-PAC OT 6 Clicks Daily Activity     Outcome Measure Help from another person eating meals?: None Help from another person taking care of personal grooming?: None Help from another person toileting, which includes using toliet, bedpan, or urinal?: None Help from another person bathing (including  washing, rinsing, drying)?: None Help from another person to put on and taking off regular upper body clothing?: None Help from another person to put on and taking off regular lower body clothing?: None 6 Click Score: 24   End of Session Equipment Utilized During Treatment: Oxygen  Nurse Communication: Mobility status  Activity Tolerance: Patient tolerated treatment well Patient left: in bed;with call bell/phone within reach;with bed alarm set                   Time: 8978-8961 OT Time Calculation (min): 17 min Charges:  OT General Charges $OT Visit: 1 Visit OT Evaluation $OT Eval Moderate Complexity: 1 Mod OT Treatments $Self Care/Home Management : 8-22 mins  Izetta Claude, MS, OTR/L , CBIS ascom [phone]  05/15/24, 1:35 PM

## 2024-05-15 NOTE — Progress Notes (Addendum)
 [facility] CLINIC CARDIOLOGY PROGRESS NOTE   Patient ID: [name] [name] MRN: [id number] DOB/AGE: [birth year] 79 y.o.  Admit date: 05/09/2024 Referring Physician Dr. Elvan Sor Primary Physician Orlean Alan HERO, FNP  Primary Cardiologist Dr. Denyse Bathe Reason for Consultation SOB, respiratory distress  HPI: [name] [name] is a 79 y.o. 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 Cardiology was consulted for further evaluation.   Interval History:  -Patient seen and examined this AM, states he feels overall better today. - Shortness of breath improved, remains on 5 L supplemental O2.  Still with some lower extremity edema. -BP and HR stable with no events on tele.  Review of systems complete and found to be negative unless listed above   Vitals:   05/15/24 0117 05/15/24 0432 05/15/24 0500 05/15/24 0806  BP: (!) 108/51 121/61  (!) 120/55  Pulse: 91 93  86  Resp: 19 20  20   Temp: 98.1 F (36.7 C) 98 F (36.7 C)  97.9 F (36.6 C)  TempSrc: Oral Oral    SpO2: 93% 97%  100%  Weight:   81.8 kg   Height:         Intake/Output Summary (Last 24 hours) at 05/15/2024 9176 Last data filed at 05/15/2024 0445 Gross per 24 hour  Intake 360 ml  Output 1850 ml  Net -1490 ml     PHYSICAL EXAM General: Chronically ill appearing male, well nourished, in no acute distress. HEENT: Normocephalic and atraumatic. Neck: No JVD.  Lungs: Normal respiratory effort on 5L [place]. Clear bilaterally to auscultation. No wheezes, crackles, rhonchi.  Heart: HRRR. Normal S1 and S2 without gallops or murmurs. Radial & DP pulses 2+ bilaterally. Abdomen: Non-distended appearing.  Msk: Normal  strength and tone for age. Extremities: No clubbing, cyanosis or edema.   Neuro: Alert and oriented X 3. Psych: Mood appropriate, affect congruent.    LABS: Basic Metabolic Panel: Recent Labs    05/12/24 1050 05/13/24 0250 05/14/24 0424 05/15/24 0333  NA 136   < > 138 136  K 3.8   < > 3.8 3.9  CL 96*   < > 99 96*  CO2 25   < > 26 26  GLUCOSE 71   < > 121* 130*  BUN 65*   < > 52* 31*  CREATININE 4.90*   < > 4.25* 3.15*  CALCIUM 9.2   < > 9.0 9.0  PHOS 6.5*  --   --   --    < > = values in this interval not displayed.   Liver Function Tests: Recent Labs    05/12/24 1050  ALBUMIN  3.3*   No results for input(s): LIPASE, AMYLASE in the last 72 hours. CBC: Recent Labs    05/14/24 0424 05/15/24 0333  WBC 5.6 6.7  HGB 9.7* 10.4*  HCT 30.1* 32.2*  MCV 98.4 100.0  PLT 144* 192   Cardiac Enzymes: No results for input(s): CKTOTAL, CKMB, CKMBINDEX, TROPONINIHS in the last 72 hours. BNP: No results for input(s): BNP in the last 72 hours. D-Dimer: No results for input(s): DDIMER in the last 72 hours. Hemoglobin A1C: No results for input(s): HGBA1C in the last 72 hours. Fasting Lipid Panel: No  results for input(s): CHOL, HDL, LDLCALC, TRIG, CHOLHDL, LDLDIRECT in the last 72 hours. Thyroid  Function Tests: No results for input(s): TSH, T4TOTAL, T3FREE, THYROIDAB in the last 72 hours.  Invalid input(s): FREET3 Anemia Panel: No results for input(s): VITAMINB12, FOLATE, FERRITIN, TIBC, IRON, RETICCTPCT in the last 72 hours.  US  Venous Img Lower Unilateral Right (DVT) Result Date: 05/14/2024 CLINICAL DATA:  Edema EXAM: RIGHT LOWER EXTREMITY VENOUS DOPPLER ULTRASOUND TECHNIQUE: Gray-scale sonography with compression, as well as color and duplex ultrasound, were performed to evaluate the deep venous system(s) from the level of the common femoral vein through the popliteal and proximal calf veins. COMPARISON:  None available  FINDINGS: VENOUS Normal compressibility of the common femoral, femoral, and popliteal veins, as well as the visualized calf veins. Visualized portions of profunda femoral vein and great saphenous vein unremarkable. No filling defects to suggest DVT on grayscale or color Doppler imaging. Doppler waveforms show normal direction of venous flow, normal respiratory plasticity and response to augmentation. Limited views of the contralateral common femoral vein are unremarkable. OTHER None. Limitations: none IMPRESSION: No right lower extremity DVT. Electronically Signed   By: Aliene Lloyd M.D.   On: 05/14/2024 07:34   PERIPHERAL VASCULAR CATHETERIZATION Result Date: 05/13/2024 See surgical note for result.     ECHO as above  TELEMETRY (personally reviewed): sinus rhythm rate 90s  EKG (personally reviewed): Sinus tachycardia rate of 120 nonspecific ST-T wave changes   DATA reviewed by me 05/15/24: last 24h vitals tele labs imaging I/O, hospitalist progress note  Principal Problem:   Acute on chronic combined systolic and diastolic CHF (congestive heart failure) (HCC) Active Problems:   COPD (chronic obstructive pulmonary disease) (HCC)   OSA on CPAP   Coronary artery disease   Hypotension   Severe aortic stenosis S/P TAVR (transcatheter aortic valve replacement)   Acute on chronic respiratory failure with hypoxia (HCC)   Anxiety and depression   ESRD on hemodialysis (HCC)   SIRS (systemic inflammatory response syndrome) (HCC)   Acute respiratory tract infection, possibly lower   Fluid overload    ASSESSMENT AND PLAN: [name] [name] is a 79 y.o. male with a past medical history of ESRD on HD Mondays and Fridays only by choice, anemia of chronic renal disease, COPD with chronic hypoxic respiratory failure on 2 L, OSA on CPAP, CAD, severe aortic stenosis s/p TAVR (2018) type 2 diabetes, pulmonary hypertension who presented to the ED on 05/09/2024 for SOB and respiratory distress after  missing HD. Cardiology was consulted for further evaluation.   # Acute on chronic hypoxic respiratory failure # ESRD on HD # Acute on chronic HFrEF # S/p TAVR Patient presented for worsening SOB after missing a dialysis session. BNP >35,000. Echo this admission with EF 25-30%, global hypokinesis, moderate to severe LV dilation with mild LVH, grade II diastolic dysfunction, severe AS. Underwent HD 1/17 with 2.25 L removed and 1/19 with 2 L removed.  -Continue with HD as per nephrology, appreciate their recommendations.  -Continue IV lasix  40 mg twice daily, unclear if patient makes much urine he was not able to answer this when I asked him.  Has had minimal urine output documented during hospitalization.  Weight is down about 15 kg since admission. -Continue aspirin  81 mg daily and zetia  10 mg daily.  -Continue metoprolol  succinate 12.5 mg daily.  Unable to tolerate additional GDMT due to BP, renal function. -Palliative consulted for GOC discussion given presentation as well as echo findings with reduced EF and now  severe AS. Likely would be poor candidate for intervention given his comorbidities and poor compliance.  This patient's case was discussed and created with Dr. Florencio and he is in agreement.  Signed:  Danita Bloch, PA-C  05/15/2024, 8:23 AM [facility]

## 2024-05-16 ENCOUNTER — Telehealth: Payer: Self-pay

## 2024-05-16 NOTE — Patient Instructions (Signed)
 Visit Information  Thank you for taking time to visit with me today. Please don't hesitate to contact me if I can be of assistance to you before our next scheduled telephone appointment.  Our next appointment is by telephone on Thursday January 29th at 11:30am  Following is a copy of your care plan:   Goals Addressed             This Visit's Progress    VBCI Transitions of Care (TOC) Care Plan       Problems:  Recent Hospitalization for treatment of CHF and ESRD Hospital or ED Adm Risk is 69%  Goal:  Over the next 30 days, the patient will not experience hospital readmission  Interventions:   Heart Failure Interventions: Basic overview and discussion of pathophysiology of Heart Failure reviewed Provided education on low sodium diet Discussed importance of daily weight and advised patient to weigh and record daily Reviewed role of diuretics in prevention of fluid overload and management of heart failure; Discussed the importance of keeping all appointments with provider Assess BP three times a day. Give Midodrine  2.5mg  if systolic is less than <100 Encourage activity as tolerated   Chronic Kidney Disease Interventions: Evaluation of current treatment plan related to chronic kidney disease self management and patient's adherence to plan as established by provider      Reviewed medications with patient and discussed importance of compliance    Advised patient, providing education and rationale, to monitor blood pressure daily and record, calling PCP for findings outside established parameters    Advised patient to discuss Palliative Care Consult  with provider    Last practice recorded BP readings:  BP Readings from Last 3 Encounters:  05/15/24 (!) 120/55  04/29/24 (!) 122/56  04/23/24 129/70   Most recent eGFR/CrCl:  Lab Results  Component Value Date   EGFR 14 (L) 01/16/2024    No components found for: CRCL  Patient Self Care Activities:  Attend all scheduled  provider appointments Call pharmacy for medication refills 3-7 days in advance of running out of medications Call provider office for new concerns or questions  Notify RN Care Manager of [facility] call rescheduling needs Participate in Transition of Care Program/Attend TOC scheduled calls Take medications as prescribed    Plan:  Telephone follow up appointment with care management team member scheduled for:  Thursday January 29th at 11:30am        Patient verbalizes understanding of instructions and care plan provided today and agrees to view in MyChart. Active MyChart status and patient understanding of how to access instructions and care plan via MyChart confirmed with patient.     The patient has been provided with contact information for the care management team and has been advised to call with any health related questions or concerns.   Please call the care guide team at [phone] if you need to cancel or reschedule your appointment.   Please call the Suicide and Crisis Lifeline: 988 call the [place]  National Suicide Prevention Lifeline: 7241334364 or TTY: 2698700746 TTY (248) [phone]) to talk to a trained counselor if you are experiencing a Mental Health or Behavioral Health Crisis or need someone to talk to.  Medford Balboa, BSN, RN [place]  VBCI - [employer] [facility] RN Care Manager [phone]

## 2024-05-20 ENCOUNTER — Ambulatory Visit: Admitting: Family

## 2024-05-22 ENCOUNTER — Other Ambulatory Visit: Payer: Self-pay

## 2024-05-22 ENCOUNTER — Other Ambulatory Visit: Payer: Self-pay | Admitting: Family

## 2024-05-22 NOTE — Transitions of Care (Post Inpatient/ED Visit) (Signed)
 " Transition of Care week 2  Visit Note  05/22/2024  Name: [name] [name] MRN: [id number]          DOB: 04/11/1946  Situation: Patient enrolled in [facility] 30-day program. Visit completed with Gloria Fassnacht, DPR by telephone.   Background:   Initial Transition Care Management Follow-up Telephone Call Discharge Date and Diagnosis: 05/15/24, CHF   Past Medical History:  Diagnosis Date   Accidental cut, puncture, perforation, or hemorrhage during heart catheterization 12/27/2012   Acute kidney injury superimposed on stage 4 chronic kidney disease (HCC) 07/27/2020   Acute on chronic combined systolic and diastolic CHF (congestive heart failure) (HCC) 04/25/2023   Acute on chronic respiratory failure with hypoxia (HCC) 04/22/2023   Adopted    AKI (acute kidney injury) 02/05/2023   ANA positive 07/12/2015   Aortic atherosclerosis    Aortic stenosis, severe    a.) s/p TAVR on 08/01/2016: 29 mm CoreValve Evolut placed   Arthritis    Bacteremia due to Streptococcus 04/24/2023   Basal cell carcinoma 01/04/2010   Right sup. post. helix. Excised 03/02/2010   BCC (basal cell carcinoma of skin) 11/24/2020   R neck below the ear, EDC   BPH (benign prostatic hyperplasia)    CAD (coronary artery disease) 12/26/2012   a.) LHC 12/26/2012: 50% mLAD, 80% mLCx, 100% pRCA --> CVTS at [facility] --> decided again revascularization due to insig LAD disease and the fact that RCA lesion was chronic   CAP (community acquired pneumonia) 07/26/2020   Chronic respiratory failure with hypoxia (HCC)    Claudication of both lower extremities    Complete tear of right rotator cuff 11/15/2015   Complex tear of lateral meniscus of left knee as current injury 12/06/2020   Complex tear of medial meniscus of left knee as current injury 10/18/2020   COPD (chronic obstructive pulmonary disease) (HCC)    Diarrhea 02/04/2023   Diet-controlled type 2 diabetes mellitus (HCC)    Diverticulosis    Dyspnea     Electrolyte abnormality 02/04/2023   ESRD (end stage renal disease) (HCC)    GERD (gastroesophageal reflux disease)    Gout    Heart murmur    HFrEF (heart failure with reduced ejection fraction) (HCC)    a.) s/p TAVR on 08/01/2016: 29 mm CoreValve Evolut placed   History of bilateral cataract extraction    History of hiatal hernia    HLD (hyperlipidemia)    Hyperkalemia 07/30/2020   Hypertension    Hypomagnesemia 02/05/2023   Hypothyroidism    IDA (iron deficiency anemia)    Migraines    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   a.) TTE 04/24/2023: moderate (MPG 7.5 mmHG)   OSA on CPAP    Pneumonia 07/2020   Pulmonary hypertension (HCC)    a.) TTE 08/17/2022: mild pHTN per echo report; b.) TTE 04/24/2023: RVSP 39.1   Recurrent nephrolithiasis 12/27/2012   Rotator cuff tendinitis, right 11/15/2015   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 MOHS.   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/07/2021   Right zygoma, EDC 05/02/21   Statin intolerance (myalgias)    Supplemental oxygen  dependent (  2-3 L/[place])     Assessment: Patient Reported Symptoms: Cognitive Cognitive Status: Alert and oriented to person, place, and time, Normal speech and language skills      Neurological Neurological Review of Symptoms: Weakness    HEENT HEENT Symptoms Reported: No symptoms reported      Cardiovascular Cardiovascular Symptoms Reported: Fainting Does patient have uncontrolled Hypertension?: No Cardiovascular Management Strategies: Medication therapy, Routine screening, Coping strategies, Adequate rest Cardiovascular Comment:  The patient's spouse has been checking his BP three times a day and his systolic has remained above 100 and she has not had to give Midodrine . The patient is fatigues. HF is 25%  Respiratory Additional Respiratory Details: On Oxygen  continuous at 4L/min. The spouse has not been checking his O2 sats Respiratory Management Strategies: Adequate rest, Routine screening, Oxygen  therapy, Medication therapy Respiratory Self-Management Outcome: 3 (uncertain)  Endocrine Endocrine Symptoms Reported: No symptoms reported Is patient diabetic?: No    Gastrointestinal Gastrointestinal Symptoms Reported: No symptoms reported      Genitourinary Genitourinary Symptoms Reported: Other Other Genitourinary Symptoms: Dialysis Genitourinary Management Strategies: Hemodialysis Hemodialysis Schedule: Increased to Monday, Wednesday, Friday Hemodialysis Last Treatment: 05/21/24 Genitourinary Comment: The patient does void in between dialysis sessions  Integumentary Integumentary Symptoms Reported: No symptoms reported    Musculoskeletal Musculoskelatal Symptoms Reviewed: Weakness, Limited mobility Musculoskeletal Management Strategies: Routine screening Musculoskeletal Comment: The patient's spouse would like to talk with the provider regarding HHPT      Psychosocial Psychosocial Symptoms Reported: No symptoms reported         There were no vitals filed for this visit.    Medications Reviewed Today   Medications were not reviewed in this encounter     Recommendation:   Continue Current Plan of Care  Follow Up Plan:   Telephone follow-up in 1 week  Medford Balboa, BSN, RN [place]  VBCI - [facility] RN Care Manager [phone]     "

## 2024-05-22 NOTE — Patient Instructions (Signed)
 Visit Information  Thank you for taking time to visit with me today. Please don't hesitate to contact me if I can be of assistance to you before our next scheduled telephone appointment.  Following is a copy of your care plan:   Goals Addressed             This Visit's Progress    VBCI Transitions of Care (TOC) Care Plan       Problems: (reviewed 05/22/24) Recent Hospitalization for treatment of CHF and ESRD Hospital or ED Adm Risk is 69%  Goal: (reviewed 05/22/24) Over the next 30 days, the patient will not experience hospital readmission  Interventions: (reviewed 05/22/24)  Heart Failure Interventions: Basic overview and discussion of pathophysiology of Heart Failure reviewed Provided education on low sodium diet Discussed importance of daily weight and advised patient to weigh and record daily Reviewed role of diuretics in prevention of fluid overload and management of heart failure; Discussed the importance of keeping all appointments with provider Assess BP three times a day. Give Midodrine  2.5mg  if systolic is less than <100 Encourage activity as tolerated Oxygen  at 4L/min. Maintain O2 sats above 90% 05/22/24 - Requesting HHPT referral for energy conservation and increased strength    Chronic Kidney Disease Interventions: Evaluation of current treatment plan related to chronic kidney disease self management and patient's adherence to plan as established by provider      Reviewed medications with patient and discussed importance of compliance    Advised patient, providing education and rationale, to monitor blood pressure daily and record, calling PCP for findings outside established parameters    Advised patient to discuss Palliative Care Consult  with provider    Last practice recorded BP readings:  BP Readings from Last 3 Encounters:  05/15/24 (!) 120/55  04/29/24 (!) 122/56  04/23/24 129/70   Most recent eGFR/CrCl:  Lab Results  Component Value Date   EGFR 14 (L)  01/16/2024    No components found for: CRCL 05/22/24 - HD sessions are now M,W,F  Patient Self Care Activities: (reviewed 05/22/24) Attend all scheduled provider appointments Call pharmacy for medication refills 3-7 days in advance of running out of medications Call provider office for new concerns or questions  Notify RN Care Manager of [facility] call rescheduling needs Participate in Transition of Care Program/Attend [facility] scheduled calls Take medications as prescribed    Plan:  Telephone follow up appointment with care management team member scheduled for:  Thursday February 5th at 11:30am        Patient verbalizes understanding of instructions and care plan provided today and agrees to view in MyChart. Active MyChart status and patient understanding of how to access instructions and care plan via MyChart confirmed with patient.     The patient has been provided with contact information for the care management team and has been advised to call with any health related questions or concerns.   Please call the care guide team at [phone] if you need to cancel or reschedule your appointment.   Please call the Suicide and Crisis Lifeline: 988 call the [place]  National Suicide Prevention Lifeline: 9015097677 or TTY: 870-448-5133 TTY [phone]) to talk to a trained counselor if you are experiencing a Mental Health or Behavioral Health Crisis or need someone to talk to.  Medford Balboa, BSN, RN [place]  VBCI - [employer] [facility] RN Care Manager [phone]

## 2024-05-23 ENCOUNTER — Other Ambulatory Visit: Payer: Self-pay | Admitting: Family

## 2024-05-23 ENCOUNTER — Telehealth: Payer: Self-pay | Admitting: Family

## 2024-05-23 ENCOUNTER — Telehealth (INDEPENDENT_AMBULATORY_CARE_PROVIDER_SITE_OTHER): Payer: Self-pay

## 2024-05-23 MED ORDER — LEVOFLOXACIN 500 MG PO TABS: 500.0000 mg | ORAL_TABLET | ORAL | 0 refills | Status: AC

## 2024-05-23 NOTE — Telephone Encounter (Signed)
 Gloria left VM stating patient is coughing and blowing out green mucus and wants to see if Alan can call him in something for this.   Please advise.

## 2024-05-23 NOTE — Telephone Encounter (Signed)
 Patient spouse reach to the office stating that her husband went to dialysis today but they was not able to get the needle in to start filtration. Patient was sent home from dialysis. Patient spouse was advised that the dialysis center will need to send a order to the office. Patient verbalized understanding and stated that she will contact the dialysis center.

## 2024-05-27 ENCOUNTER — Ambulatory Visit: Admitting: Cardiovascular Disease

## 2024-05-27 ENCOUNTER — Other Ambulatory Visit (INDEPENDENT_AMBULATORY_CARE_PROVIDER_SITE_OTHER): Payer: Self-pay | Admitting: Nurse Practitioner

## 2024-05-27 ENCOUNTER — Telehealth (INDEPENDENT_AMBULATORY_CARE_PROVIDER_SITE_OTHER): Payer: Self-pay

## 2024-05-27 DIAGNOSIS — N186 End stage renal disease: Secondary | ICD-10-CM

## 2024-05-28 ENCOUNTER — Other Ambulatory Visit (INDEPENDENT_AMBULATORY_CARE_PROVIDER_SITE_OTHER)

## 2024-05-28 DIAGNOSIS — N186 End stage renal disease: Secondary | ICD-10-CM

## 2024-05-29 ENCOUNTER — Other Ambulatory Visit: Payer: Self-pay

## 2024-05-29 NOTE — Transitions of Care (Post Inpatient/ED Visit) (Signed)
 " Transition of Care week 3  Visit Note  05/29/2024  Name: [name] [name] MRN: [id number]          DOB: 1945-12-10  Situation: Patient enrolled in [facility] 30-day program. Visit completed with Deward Level by telephone.   Background:   Initial Transition Care Management Follow-up Telephone Call Discharge Date and Diagnosis: 05/15/24, CHF   Past Medical History:  Diagnosis Date   Accidental cut, puncture, perforation, or hemorrhage during heart catheterization 12/27/2012   Acute kidney injury superimposed on stage 4 chronic kidney disease (HCC) 07/27/2020   Acute on chronic combined systolic and diastolic CHF (congestive heart failure) (HCC) 04/25/2023   Acute on chronic respiratory failure with hypoxia (HCC) 04/22/2023   Adopted    AKI (acute kidney injury) 02/05/2023   ANA positive 07/12/2015   Aortic atherosclerosis    Aortic stenosis, severe    a.) s/p TAVR on 08/01/2016: 29 mm CoreValve Evolut placed   Arthritis    Bacteremia due to Streptococcus 04/24/2023   Basal cell carcinoma 01/04/2010   Right sup. post. helix. Excised 03/02/2010   BCC (basal cell carcinoma of skin) 11/24/2020   R neck below the ear, EDC   BPH (benign prostatic hyperplasia)    CAD (coronary artery disease) 12/26/2012   a.) LHC 12/26/2012: 50% mLAD, 80% mLCx, 100% pRCA --> CVTS at [facility] [place] --> decided again revascularization due to insig LAD disease and the fact that RCA lesion was chronic   CAP (community acquired pneumonia) 07/26/2020   Chronic respiratory failure with hypoxia (HCC)    Claudication of both lower extremities    Complete tear of right rotator cuff 11/15/2015   Complex tear of lateral meniscus of left knee as current injury 12/06/2020   Complex tear of medial meniscus of left knee as current injury 10/18/2020   COPD (chronic obstructive pulmonary disease) (HCC)    Diarrhea 02/04/2023   Diet-controlled type 2 diabetes mellitus (HCC)    Diverticulosis    Dyspnea    Electrolyte  abnormality 02/04/2023   ESRD (end stage renal disease) (HCC)    GERD (gastroesophageal reflux disease)    Gout    Heart murmur    HFrEF (heart failure with reduced ejection fraction) (HCC)    a.) s/p TAVR on 08/01/2016: 29 mm CoreValve Evolut placed   History of bilateral cataract extraction    History of hiatal hernia    HLD (hyperlipidemia)    Hyperkalemia 07/30/2020   Hypertension    Hypomagnesemia 02/05/2023   Hypothyroidism    IDA (iron deficiency anemia)    Migraines    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   a.) TTE 04/24/2023: moderate (MPG 7.5 mmHG)   OSA on CPAP    Pneumonia 07/2020   Pulmonary hypertension (HCC)    a.) TTE 08/17/2022: mild pHTN per echo report; b.) TTE 04/24/2023: RVSP 39.1   Recurrent nephrolithiasis 12/27/2012   Rotator cuff tendinitis, right 11/15/2015   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 MOHS.   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/07/2021   Right zygoma, EDC 05/02/21   Statin intolerance (myalgias)    Supplemental oxygen  dependent (2-3  L/[place])     Assessment: Patient Reported Symptoms: Cognitive Cognitive Status: Alert and oriented to person, place, and time, Normal speech and language skills      Neurological Neurological Review of Symptoms: No symptoms reported    HEENT HEENT Symptoms Reported: No symptoms reported      Cardiovascular Cardiovascular Symptoms Reported: Swelling in legs or feet Does patient have uncontrolled Hypertension?: No Cardiovascular Management Strategies: Medication therapy, Coping strategies, Adequate rest Weight: 172 lb (78  kg) Cardiovascular Comment: The patient's spouse has been giving the patient Metoprolol  25mg  instead of 12.5mg  but his systolic BP's have remained above 100. He states he has swelling in both feet with right greater than left but he states that is due to Gout. Weight as been stable  Respiratory Respiratory Symptoms Reported: Productive cough, Shortness of breath Additional Respiratory Details: The patient has audible cough that is wet sounding. States the sputum is tan. He called the PCP and he is on an antibiotic. Respiratory Management Strategies: Adequate rest, Routine screening, Oxygen  therapy, Medication therapy  Endocrine Endocrine Symptoms Reported: Not assessed    Gastrointestinal Gastrointestinal Symptoms Reported: No symptoms reported      Genitourinary Genitourinary Symptoms Reported: Frequency Additional Genitourinary Details: The patient takes a Diuretic Genitourinary Management Strategies: Hemodialysis Hemodialysis Schedule: M,W,F Hemodialysis Last Treatment: 05/21/24 Genitourinary Comment: The patient has not had Dialysis in a week due to the team is not able to access the stent. The patient does void in between. He went to have an Ultrasound and the catheter had moved out of place. He has markings on his arm for continued dialysis treatments starting 05/30/24  Integumentary Integumentary Symptoms Reported: Bruising, Other Other Integumentary Symptoms: Hematoma Skin Comment: the patient reports brusing and pain to his arm where he was stuck multiple times trying to get access to his dialysis catheter  Musculoskeletal Musculoskelatal Symptoms Reviewed: Weakness Musculoskeletal Management Strategies: Routine screening      Psychosocial Psychosocial Symptoms Reported: No symptoms reported         Today's Vitals   05/29/24 1212  Weight: 172 lb (78 kg)      Medications Reviewed Today     Reviewed by Moises Reusing, RN (Case Manager) on 05/29/24 at 1142  Med List  Status: <None>   Medication Order Taking? Sig Documenting Provider Last Dose Status Informant  acetaminophen  (TYLENOL ) 650 MG CR tablet [id number]  Take 1,300 mg by mouth every 8 (eight) hours as needed for pain. [provider]  Active Spouse/Significant Other  albuterol  (VENTOLIN  HFA) 108 (90 Base) MCG/ACT inhaler [id number]  Inhale 2 puffs into the lungs every 6 (six) hours as needed for wheezing or shortness of breath. [provider]  Active Spouse/Significant Other  Alcohol Swabs (DROPSAFE ALCOHOL PREP) 70 % PADS [id number]  Apply 1 each topically as needed (With repatha and blood sugar checks.). [provider]  Active Spouse/Significant Other  Ascorbic Acid  (VITAMIN C ) 1000 MG tablet [id number]  Take 1,000 mg by mouth at bedtime. [provider]  Active Spouse/Significant Other  aspirin  EC 81 MG tablet [id number]  Take 81 mg by mouth daily. Swallow whole. [provider]  Active Spouse/Significant Other  busPIRone  (BUSPAR ) 5 MG tablet [id number]  TAKE 1 TABLET BY MOUTH EVERYDAY AT BEDTIME Orlean Alan HERO, FNP  Active   calcitRIOL  (ROCALTROL ) 0.25 MCG capsule [id number]  Take 0.25 mcg by mouth daily. [provider]  Active Spouse/Significant Other  citalopram  (CELEXA ) 40 MG tablet [id number]  Take 1 tablet (40 mg total) by  mouth daily. Orlean Alan HERO, FNP  Active Spouse/Significant Other  Evolocumab [facility] SURECLICK) 140 MG/ML EMMANUEL [id number]  INJECT CONTENT OF 1 CARTRIDGE UNDER THE SKIN EVERY OTHER WEEK Fernand Denyse LABOR, MD  Active Spouse/Significant Other  ezetimibe  (ZETIA ) 10 MG tablet [id number]  TAKE 1 TABLET BY MOUTH EVERYDAY AT BEDTIME Orlean Alan HERO, FNP  Active Spouse/Significant Other  febuxostat  (ULORIC ) 40 MG tablet [id number]  Take 40 mg by mouth at bedtime. [provider]  Active Spouse/Significant Other  fexofenadine (ALLEGRA) 180 MG tablet [id number]  Take 180 mg by mouth daily as needed for allergies or rhinitis.  [provider]  Active Spouse/Significant Other  fluorouracil  (EFUDEX ) 5 % cream [id number]  Apply twice a day to rough areas on  temples, cheeks, forehead, top of scalp/head, crown of scalp until reaction occurs Claudene Lehmann, MD  Active Spouse/Significant Other  Fluticasone -Umeclidin-Vilant (TRELEGY ELLIPTA ) 100-62.5-25 MCG/ACT AEPB [id number]  Inhale 1 Inhalation into the lungs daily at 6 (six) AM. Orlean Alan HERO, FNP  Active Spouse/Significant Other  levofloxacin  (LEVAQUIN ) 500 MG tablet [id number]  Take 1 tablet (500 mg total) by mouth every other day. Orlean Alan HERO, FNP  Active   MAGNESIUM  OXIDE 400 PO [id number]  Take by mouth Nightly. [provider]  Active Spouse/Significant Other  metolazone (ZAROXOLYN) 5 MG tablet [id number]  Take 5 mg by mouth once a week. [provider]  Active Spouse/Significant Other  metoprolol  succinate (TOPROL -XL) 25 MG 24 hr tablet [id number]  Take 0.5 tablets (12.5 mg total) by mouth daily. Laurita Pillion, MD  Active   metoprolol  succinate (TOPROL -XL) 50 MG 24 hr tablet [id number]  TAKE 1 TABLET BY MOUTH EVERY DAY Orlean Alan HERO, FNP  Active   midodrine  (PROAMATINE ) 5 MG tablet [id number]  Take 0.5 tablets (2.5 mg total) by mouth 3 (three) times daily as needed (SBP <100). Laurita Pillion, MD  Active   Multiple Vitamins-Minerals (PRESERVISION AREDS) CAPS [id number]  Take 1 capsule by mouth 2 (two) times daily. [provider]  Active Spouse/Significant Other  OHTUVAYRE  3 MG/2.5ML SUSP [id number]  Inhale 3 mg into the lungs. [provider]  Active Spouse/Significant Other  omeprazole  (PRILOSEC) 40 MG capsule [id number]  TAKE 1 CAPSULE (40 MG TOTAL) BY MOUTH DAILY. Orlean Alan HERO, FNP  Active   OXYGEN  [id number]  Inhale 2-3 L into the lungs See admin instructions. Used as needed throughout the day and continuous at bedtime  Patient taking differently: Inhale 4 L into the lungs See admin instructions. Used as needed  throughout the day and continuous at bedtime   [provider]  Active Spouse/Significant Other  pantoprazole  (PROTONIX ) 40 MG tablet [id number]   [provider]  Active Spouse/Significant Other  SUPER B COMPLEX/C PO [id number]  Take 1 tablet by mouth daily. [provider]  Active Spouse/Significant Other  torsemide  (DEMADEX ) 20 MG tablet [id number]  Take 20 mg by mouth daily. [provider]  Active Spouse/Significant Other           Med Note JANEAN, ANGELA H   Tue Oct 02, 2023  3:51 PM) 2 tablets daily            Recommendation:   Continue Current Plan of Care  Follow Up Plan:   Telephone follow-up in 1 week  Medford Balboa, BSN, RN [place]  VBCI - [facility] RN Care Manager [phone]     "

## 2024-05-29 NOTE — Patient Instructions (Signed)
 Visit Information  Thank you for taking time to visit with me today. Please don't hesitate to contact me if I can be of assistance to you before our next scheduled telephone appointment.  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 Give Midodrine  2.5mg  if systolic is less than <100 Encourage activity as tolerated Oxygen  at 4L/min. Maintain O2 sats above 90% 05/22/24 - Requesting HHPT referral for energy conservation and increased strength    Chronic Kidney Disease Interventions: Evaluation of current treatment plan related to chronic kidney disease self management and patient's adherence to plan as established by provider      Reviewed medications with patient and discussed importance of compliance    Advised patient, providing education and rationale, to monitor blood pressure daily and record, calling PCP for findings outside established parameters    Advised patient to discuss Palliative Care Consult  with provider    Last practice recorded BP readings:  BP Readings from Last 3 Encounters:  05/15/24 (!) 120/55  04/29/24 (!) 122/56  04/23/24 129/70    Most recent eGFR/CrCl:  Lab Results  Component Value Date   EGFR 14 (L) 01/16/2024    No components found for: CRCL 05/22/24 - HD sessions are now M,W,F 05/29/24 - The patient's stents in his arm were not working and he had to go to the Vascular clinic for an Ultrasound. The patient has not had dialysis in a week   Patient Self Care Activities: (reviewed 05/29/24) Attend all scheduled provider appointments Call pharmacy for medication refills 3-7 days in advance of running out of medications Call provider office for new concerns or questions  Notify RN Care Manager of [facility] call rescheduling needs Participate in Transition of Care Program/Attend [facility] scheduled calls Take medications as prescribed    Plan:  Telephone follow up appointment with care management team member scheduled for:  Thursday February 12th at 11:30am        Patient verbalizes understanding of instructions and care plan provided today and agrees to view in MyChart. Active MyChart status and patient understanding of how to access instructions and care plan via MyChart confirmed with patient.     The patient has been provided with contact information for the care management team and has been advised to call with any health related questions or concerns.   Please call the care guide team at [phone] if you need to cancel or reschedule your appointment.   Please call the Suicide and Crisis Lifeline: 988 call the [place]  National Suicide Prevention Lifeline: 717-355-9135 or TTY: 541-652-5454 TTY 938-111-9020) to talk to a trained counselor if you are experiencing a Mental Health or  Behavioral Health Crisis or need someone to talk to.  Medford Balboa, BSN, RN [place]  [facility] - [employer] [facility] RN Care Manager [phone]

## 2024-06-02 ENCOUNTER — Ambulatory Visit (INDEPENDENT_AMBULATORY_CARE_PROVIDER_SITE_OTHER): Admitting: Vascular Surgery

## 2024-06-03 ENCOUNTER — Ambulatory Visit: Admitting: Family

## 2024-06-05 ENCOUNTER — Telehealth

## 2024-06-09 ENCOUNTER — Ambulatory Visit (INDEPENDENT_AMBULATORY_CARE_PROVIDER_SITE_OTHER): Admitting: Vascular Surgery

## 2024-06-10 ENCOUNTER — Ambulatory Visit: Admitting: Cardiovascular Disease

## 2024-06-23 ENCOUNTER — Other Ambulatory Visit

## 2024-07-31 ENCOUNTER — Ambulatory Visit (INDEPENDENT_AMBULATORY_CARE_PROVIDER_SITE_OTHER): Admitting: Vascular Surgery

## 2024-07-31 ENCOUNTER — Encounter (INDEPENDENT_AMBULATORY_CARE_PROVIDER_SITE_OTHER)

## 2024-09-11 ENCOUNTER — Ambulatory Visit: Admitting: Dermatology

## 2024-12-24 ENCOUNTER — Ambulatory Visit: Admitting: Oncology

## 2024-12-24 ENCOUNTER — Other Ambulatory Visit
